# Patient Record
Sex: Female | Born: 1945
Health system: Southern US, Community
[De-identification: ages and names within clinical notes are randomized; demographics above are authoritative.]

## PROBLEM LIST (undated history)

## (undated) DIAGNOSIS — M6281 Muscle weakness (generalized): Secondary | ICD-10-CM

## (undated) DIAGNOSIS — R41841 Cognitive communication deficit: Secondary | ICD-10-CM

## (undated) DIAGNOSIS — M48061 Spinal stenosis, lumbar region without neurogenic claudication: Secondary | ICD-10-CM

## (undated) DIAGNOSIS — G9341 Metabolic encephalopathy: Secondary | ICD-10-CM

## (undated) DIAGNOSIS — G5603 Carpal tunnel syndrome, bilateral upper limbs: Secondary | ICD-10-CM

## (undated) DIAGNOSIS — N179 Acute kidney failure, unspecified: Secondary | ICD-10-CM

## (undated) DIAGNOSIS — R519 Headache, unspecified: Secondary | ICD-10-CM

## (undated) DIAGNOSIS — K219 Gastro-esophageal reflux disease without esophagitis: Secondary | ICD-10-CM

## (undated) DIAGNOSIS — B029 Zoster without complications: Secondary | ICD-10-CM

## (undated) DIAGNOSIS — I509 Heart failure, unspecified: Secondary | ICD-10-CM

## (undated) DIAGNOSIS — M7580 Other shoulder lesions, unspecified shoulder: Secondary | ICD-10-CM

## (undated) DIAGNOSIS — D649 Anemia, unspecified: Secondary | ICD-10-CM

## (undated) DIAGNOSIS — N189 Chronic kidney disease, unspecified: Secondary | ICD-10-CM

## (undated) DIAGNOSIS — F329 Major depressive disorder, single episode, unspecified: Secondary | ICD-10-CM

## (undated) DIAGNOSIS — M4800 Spinal stenosis, site unspecified: Secondary | ICD-10-CM

## (undated) DIAGNOSIS — M353 Polymyalgia rheumatica: Secondary | ICD-10-CM

## (undated) DIAGNOSIS — R51 Headache: Secondary | ICD-10-CM

## (undated) DIAGNOSIS — R1310 Dysphagia, unspecified: Secondary | ICD-10-CM

## (undated) DIAGNOSIS — M2550 Pain in unspecified joint: Secondary | ICD-10-CM

## (undated) DIAGNOSIS — I10 Essential (primary) hypertension: Secondary | ICD-10-CM

## (undated) DIAGNOSIS — G894 Chronic pain syndrome: Secondary | ICD-10-CM

## (undated) DIAGNOSIS — M1990 Unspecified osteoarthritis, unspecified site: Secondary | ICD-10-CM

## (undated) DIAGNOSIS — M359 Systemic involvement of connective tissue, unspecified: Secondary | ICD-10-CM

## (undated) HISTORY — DX: Polymyalgia rheumatica: M35.3

## (undated) HISTORY — PX: CHOLECYSTECTOMY: SHX55

## (undated) HISTORY — DX: Major depressive disorder, single episode, unspecified: F32.9

## (undated) HISTORY — DX: Dysphagia, unspecified: R13.10

## (undated) HISTORY — DX: Cognitive communication deficit: R41.841

## (undated) HISTORY — DX: Chronic pain syndrome: G89.4

## (undated) HISTORY — DX: Spinal stenosis, lumbar region without neurogenic claudication: M48.061

## (undated) HISTORY — DX: Zoster without complications: B02.9

## (undated) HISTORY — DX: Muscle weakness (generalized): M62.81

## (undated) HISTORY — DX: Chronic kidney disease, unspecified: N18.9

## (undated) HISTORY — DX: Pain in unspecified joint: M25.50

## (undated) HISTORY — PX: BACK SURGERY: SHX140

## (undated) HISTORY — DX: Acute kidney failure, unspecified: N17.9

## (undated) HISTORY — DX: Metabolic encephalopathy: G93.41

## (undated) SURGERY — COLONOSCOPY
Anesthesia: Moderate Sedation

---

## 2002-12-25 ENCOUNTER — Encounter: Payer: Self-pay | Admitting: Family Medicine

## 2002-12-25 ENCOUNTER — Ambulatory Visit (HOSPITAL_COMMUNITY): Admission: RE | Admit: 2002-12-25 | Discharge: 2002-12-25 | Payer: Self-pay | Admitting: Family Medicine

## 2005-10-27 ENCOUNTER — Emergency Department (HOSPITAL_COMMUNITY): Admission: EM | Admit: 2005-10-27 | Discharge: 2005-10-27 | Payer: Self-pay | Admitting: Emergency Medicine

## 2007-08-29 ENCOUNTER — Ambulatory Visit (HOSPITAL_COMMUNITY): Admission: RE | Admit: 2007-08-29 | Discharge: 2007-08-29 | Payer: Self-pay | Admitting: Family Medicine

## 2008-07-14 ENCOUNTER — Encounter: Admission: RE | Admit: 2008-07-14 | Discharge: 2008-07-14 | Payer: Self-pay | Admitting: Family Medicine

## 2008-10-25 ENCOUNTER — Inpatient Hospital Stay (HOSPITAL_COMMUNITY): Admission: EM | Admit: 2008-10-25 | Discharge: 2008-10-29 | Payer: Self-pay | Admitting: Emergency Medicine

## 2008-10-28 ENCOUNTER — Encounter (INDEPENDENT_AMBULATORY_CARE_PROVIDER_SITE_OTHER): Payer: Self-pay | Admitting: General Surgery

## 2009-04-07 ENCOUNTER — Ambulatory Visit: Payer: Self-pay | Admitting: Orthopedic Surgery

## 2009-04-07 DIAGNOSIS — M170 Bilateral primary osteoarthritis of knee: Secondary | ICD-10-CM | POA: Insufficient documentation

## 2009-04-07 DIAGNOSIS — M25569 Pain in unspecified knee: Secondary | ICD-10-CM | POA: Insufficient documentation

## 2009-04-12 ENCOUNTER — Encounter: Payer: Self-pay | Admitting: Orthopedic Surgery

## 2009-05-10 ENCOUNTER — Ambulatory Visit (HOSPITAL_COMMUNITY): Admission: RE | Admit: 2009-05-10 | Discharge: 2009-05-10 | Payer: Self-pay | Admitting: Family Medicine
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
## 2011-01-22 ENCOUNTER — Emergency Department (HOSPITAL_COMMUNITY)

## 2011-01-22 ENCOUNTER — Emergency Department (HOSPITAL_COMMUNITY)
Admission: EM | Admit: 2011-01-22 | Discharge: 2011-01-23 | Disposition: A | Discharge status: Home / Self Care | Attending: Emergency Medicine | Admitting: Emergency Medicine

## 2011-01-22 DIAGNOSIS — M51379 Other intervertebral disc degeneration, lumbosacral region without mention of lumbar back pain or lower extremity pain: Secondary | ICD-10-CM | POA: Insufficient documentation

## 2011-01-22 DIAGNOSIS — R109 Unspecified abdominal pain: Secondary | ICD-10-CM | POA: Insufficient documentation

## 2011-01-22 DIAGNOSIS — M545 Low back pain, unspecified: Secondary | ICD-10-CM | POA: Insufficient documentation

## 2011-01-22 DIAGNOSIS — M5137 Other intervertebral disc degeneration, lumbosacral region: Secondary | ICD-10-CM | POA: Insufficient documentation

## 2011-01-22 DIAGNOSIS — I10 Essential (primary) hypertension: Secondary | ICD-10-CM | POA: Insufficient documentation

## 2011-01-22 DIAGNOSIS — M25559 Pain in unspecified hip: Secondary | ICD-10-CM | POA: Insufficient documentation

## 2011-01-22 DIAGNOSIS — M129 Arthropathy, unspecified: Secondary | ICD-10-CM | POA: Insufficient documentation

## 2011-02-06 LAB — CARDIAC PANEL(CRET KIN+CKTOT+MB+TROPI)
CK, MB: 2.8 ng/mL (ref 0.3–4.0)
Relative Index: 1.4 (ref 0.0–2.5)
Total CK: 197 U/L — ABNORMAL HIGH (ref 7–177)
Troponin I: 0.02 ng/mL (ref 0.00–0.06)

## 2011-02-06 LAB — DIFFERENTIAL
Basophils Absolute: 0 10*3/uL (ref 0.0–0.1)
Basophils Absolute: 0 10*3/uL (ref 0.0–0.1)
Basophils Absolute: 0 10*3/uL (ref 0.0–0.1)
Basophils Absolute: 0 10*3/uL (ref 0.0–0.1)
Basophils Absolute: 0.1 10*3/uL (ref 0.0–0.1)
Basophils Relative: 0 % (ref 0–1)
Basophils Relative: 0 % (ref 0–1)
Basophils Relative: 0 % (ref 0–1)
Basophils Relative: 1 % (ref 0–1)
Basophils Relative: 1 % (ref 0–1)
Eosinophils Absolute: 0 10*3/uL (ref 0.0–0.7)
Eosinophils Absolute: 0 10*3/uL (ref 0.0–0.7)
Eosinophils Absolute: 0.1 10*3/uL (ref 0.0–0.7)
Eosinophils Absolute: 0.1 10*3/uL (ref 0.0–0.7)
Eosinophils Absolute: 0.2 10*3/uL (ref 0.0–0.7)
Eosinophils Relative: 0 % (ref 0–5)
Eosinophils Relative: 0 % (ref 0–5)
Eosinophils Relative: 1 % (ref 0–5)
Eosinophils Relative: 1 % (ref 0–5)
Eosinophils Relative: 2 % (ref 0–5)
Lymphocytes Relative: 10 % — ABNORMAL LOW (ref 12–46)
Lymphocytes Relative: 17 % (ref 12–46)
Lymphocytes Relative: 25 % (ref 12–46)
Lymphocytes Relative: 25 % (ref 12–46)
Lymphocytes Relative: 9 % — ABNORMAL LOW (ref 12–46)
Lymphs Abs: 1.1 10*3/uL (ref 0.7–4.0)
Lymphs Abs: 1.1 10*3/uL (ref 0.7–4.0)
Lymphs Abs: 1.7 10*3/uL (ref 0.7–4.0)
Lymphs Abs: 1.9 10*3/uL (ref 0.7–4.0)
Lymphs Abs: 2.2 10*3/uL (ref 0.7–4.0)
Monocytes Absolute: 0.3 10*3/uL (ref 0.1–1.0)
Monocytes Absolute: 0.5 10*3/uL (ref 0.1–1.0)
Monocytes Absolute: 0.6 10*3/uL (ref 0.1–1.0)
Monocytes Absolute: 0.6 10*3/uL (ref 0.1–1.0)
Monocytes Absolute: 0.7 10*3/uL (ref 0.1–1.0)
Monocytes Relative: 3 % (ref 3–12)
Monocytes Relative: 4 % (ref 3–12)
Monocytes Relative: 7 % (ref 3–12)
Monocytes Relative: 7 % (ref 3–12)
Monocytes Relative: 8 % (ref 3–12)
Neutro Abs: 10.7 10*3/uL — ABNORMAL HIGH (ref 1.7–7.7)
Neutro Abs: 5.1 10*3/uL (ref 1.7–7.7)
Neutro Abs: 5.7 10*3/uL (ref 1.7–7.7)
Neutro Abs: 7.8 10*3/uL — ABNORMAL HIGH (ref 1.7–7.7)
Neutro Abs: 9.4 10*3/uL — ABNORMAL HIGH (ref 1.7–7.7)
Neutrophils Relative %: 65 % (ref 43–77)
Neutrophils Relative %: 66 % (ref 43–77)
Neutrophils Relative %: 76 % (ref 43–77)
Neutrophils Relative %: 87 % — ABNORMAL HIGH (ref 43–77)
Neutrophils Relative %: 87 % — ABNORMAL HIGH (ref 43–77)

## 2011-02-06 LAB — CBC
HCT: 34.6 % — ABNORMAL LOW (ref 36.0–46.0)
HCT: 34.9 % — ABNORMAL LOW (ref 36.0–46.0)
HCT: 35.8 % — ABNORMAL LOW (ref 36.0–46.0)
HCT: 37.6 % (ref 36.0–46.0)
HCT: 39.8 % (ref 36.0–46.0)
Hemoglobin: 11.6 g/dL — ABNORMAL LOW (ref 12.0–15.0)
Hemoglobin: 11.6 g/dL — ABNORMAL LOW (ref 12.0–15.0)
Hemoglobin: 12.2 g/dL (ref 12.0–15.0)
Hemoglobin: 12.9 g/dL (ref 12.0–15.0)
Hemoglobin: 13.3 g/dL (ref 12.0–15.0)
MCHC: 33.3 g/dL (ref 30.0–36.0)
MCHC: 33.4 g/dL (ref 30.0–36.0)
MCHC: 33.5 g/dL (ref 30.0–36.0)
MCHC: 34.1 g/dL (ref 30.0–36.0)
MCHC: 34.2 g/dL (ref 30.0–36.0)
MCV: 86.5 fL (ref 78.0–100.0)
MCV: 86.7 fL (ref 78.0–100.0)
MCV: 86.8 fL (ref 78.0–100.0)
MCV: 87.2 fL (ref 78.0–100.0)
MCV: 87.3 fL (ref 78.0–100.0)
Platelets: 260 10*3/uL (ref 150–400)
Platelets: 261 10*3/uL (ref 150–400)
Platelets: 271 10*3/uL (ref 150–400)
Platelets: 309 10*3/uL (ref 150–400)
Platelets: 315 10*3/uL (ref 150–400)
RBC: 3.96 MIL/uL (ref 3.87–5.11)
RBC: 4.01 MIL/uL (ref 3.87–5.11)
RBC: 4.14 MIL/uL (ref 3.87–5.11)
RBC: 4.34 MIL/uL (ref 3.87–5.11)
RBC: 4.58 MIL/uL (ref 3.87–5.11)
RDW: 14.3 % (ref 11.5–15.5)
RDW: 14.5 % (ref 11.5–15.5)
RDW: 14.6 % (ref 11.5–15.5)
RDW: 14.7 % (ref 11.5–15.5)
RDW: 14.8 % (ref 11.5–15.5)
WBC: 10.4 10*3/uL (ref 4.0–10.5)
WBC: 10.9 10*3/uL — ABNORMAL HIGH (ref 4.0–10.5)
WBC: 12.4 10*3/uL — ABNORMAL HIGH (ref 4.0–10.5)
WBC: 7.8 10*3/uL (ref 4.0–10.5)
WBC: 8.6 10*3/uL (ref 4.0–10.5)

## 2011-02-06 LAB — URINALYSIS, ROUTINE W REFLEX MICROSCOPIC
Bilirubin Urine: NEGATIVE
Bilirubin Urine: NEGATIVE
Glucose, UA: NEGATIVE mg/dL
Glucose, UA: NEGATIVE mg/dL
Hgb urine dipstick: NEGATIVE
Hgb urine dipstick: NEGATIVE
Ketones, ur: NEGATIVE mg/dL
Ketones, ur: NEGATIVE mg/dL
Leukocytes, UA: NEGATIVE
Leukocytes, UA: NEGATIVE
Nitrite: NEGATIVE
Nitrite: NEGATIVE
Protein, ur: 100 mg/dL — AB
Protein, ur: 30 mg/dL — AB
Specific Gravity, Urine: >1.03 — ABNORMAL HIGH (ref 1.005–1.030)
Specific Gravity, Urine: >1.03 — ABNORMAL HIGH (ref 1.005–1.030)
Urobilinogen, UA: 0.2 mg/dL (ref 0.0–1.0)
Urobilinogen, UA: 0.2 mg/dL (ref 0.0–1.0)
pH: 5 (ref 5.0–8.0)
pH: 7 (ref 5.0–8.0)

## 2011-02-06 LAB — COMPREHENSIVE METABOLIC PANEL
ALT: 28 U/L (ref 0–35)
ALT: 33 U/L (ref 0–35)
ALT: 39 U/L — ABNORMAL HIGH (ref 0–35)
AST: 20 U/L (ref 0–37)
AST: 23 U/L (ref 0–37)
AST: 26 U/L (ref 0–37)
Albumin: 2.9 g/dL — ABNORMAL LOW (ref 3.5–5.2)
Albumin: 3.2 g/dL — ABNORMAL LOW (ref 3.5–5.2)
Albumin: 3.5 g/dL (ref 3.5–5.2)
Alkaline Phosphatase: 71 U/L (ref 39–117)
Alkaline Phosphatase: 76 U/L (ref 39–117)
Alkaline Phosphatase: 86 U/L (ref 39–117)
BUN: 10 mg/dL (ref 6–23)
BUN: 14 mg/dL (ref 6–23)
BUN: 9 mg/dL (ref 6–23)
CO2: 27 mEq/L (ref 19–32)
CO2: 28 mEq/L (ref 19–32)
CO2: 29 mEq/L (ref 19–32)
Calcium: 8.9 mg/dL (ref 8.4–10.5)
Calcium: 9.1 mg/dL (ref 8.4–10.5)
Calcium: 9.8 mg/dL (ref 8.4–10.5)
Chloride: 102 mEq/L (ref 96–112)
Chloride: 102 mEq/L (ref 96–112)
Chloride: 104 mEq/L (ref 96–112)
Creatinine, Ser: 0.85 mg/dL (ref 0.4–1.2)
Creatinine, Ser: 0.87 mg/dL (ref 0.4–1.2)
Creatinine, Ser: 0.94 mg/dL (ref 0.4–1.2)
GFR calc Af Amer: >60 mL/min (ref >60)
GFR calc Af Amer: >60 mL/min (ref >60)
GFR calc Af Amer: >60 mL/min (ref >60)
GFR calc non Af Amer: >60 mL/min (ref >60)
GFR calc non Af Amer: >60 mL/min (ref >60)
GFR calc non Af Amer: >60 mL/min (ref >60)
Glucose, Bld: 125 mg/dL — ABNORMAL HIGH (ref 70–99)
Glucose, Bld: 91 mg/dL (ref 70–99)
Glucose, Bld: 94 mg/dL (ref 70–99)
Potassium: 3.4 mEq/L — ABNORMAL LOW (ref 3.5–5.1)
Potassium: 3.6 mEq/L (ref 3.5–5.1)
Potassium: 4 mEq/L (ref 3.5–5.1)
Sodium: 136 mEq/L (ref 135–145)
Sodium: 136 mEq/L (ref 135–145)
Sodium: 140 mEq/L (ref 135–145)
Total Bilirubin: 0.5 mg/dL (ref 0.3–1.2)
Total Bilirubin: 0.5 mg/dL (ref 0.3–1.2)
Total Bilirubin: 0.7 mg/dL (ref 0.3–1.2)
Total Protein: 6.2 g/dL (ref 6.0–8.3)
Total Protein: 6.2 g/dL (ref 6.0–8.3)
Total Protein: 6.9 g/dL (ref 6.0–8.3)

## 2011-02-06 LAB — BASIC METABOLIC PANEL
BUN: 13 mg/dL (ref 6–23)
BUN: 7 mg/dL (ref 6–23)
CO2: 28 mEq/L (ref 19–32)
CO2: 29 mEq/L (ref 19–32)
Calcium: 8.6 mg/dL (ref 8.4–10.5)
Calcium: 9.2 mg/dL (ref 8.4–10.5)
Chloride: 102 mEq/L (ref 96–112)
Chloride: 105 mEq/L (ref 96–112)
Creatinine, Ser: 0.87 mg/dL (ref 0.4–1.2)
Creatinine, Ser: 0.93 mg/dL (ref 0.4–1.2)
GFR calc Af Amer: >60 mL/min (ref >60)
GFR calc Af Amer: >60 mL/min (ref >60)
GFR calc non Af Amer: >60 mL/min (ref >60)
GFR calc non Af Amer: >60 mL/min (ref >60)
Glucose, Bld: 119 mg/dL — ABNORMAL HIGH (ref 70–99)
Glucose, Bld: 134 mg/dL — ABNORMAL HIGH (ref 70–99)
Potassium: 3.3 mEq/L — ABNORMAL LOW (ref 3.5–5.1)
Potassium: 3.9 mEq/L (ref 3.5–5.1)
Sodium: 138 mEq/L (ref 135–145)
Sodium: 139 mEq/L (ref 135–145)

## 2011-02-06 LAB — PROTIME-INR
INR: 1 (ref 0.00–1.49)
Prothrombin Time: 13 seconds (ref 11.6–15.2)

## 2011-02-06 LAB — URINE MICROSCOPIC-ADD ON

## 2011-02-06 LAB — URINE CULTURE
Colony Count: 25000
Colony Count: NO GROWTH
Culture: NO GROWTH
Special Requests: NEGATIVE

## 2011-02-06 LAB — LIPASE, BLOOD: Lipase: 33 U/L (ref 11–59)

## 2011-02-06 LAB — LACTIC ACID, PLASMA: Lactic Acid, Venous: 1.6 mmol/L (ref 0.5–2.2)

## 2011-02-06 LAB — AMYLASE: Amylase: 78 U/L (ref 27–131)

## 2011-02-06 LAB — APTT: aPTT: 28 seconds (ref 24–37)

## 2011-03-07 ENCOUNTER — Ambulatory Visit (HOSPITAL_COMMUNITY)
Admission: RE | Admit: 2011-03-07 | Discharge: 2011-03-07 | Disposition: A | Discharge status: Home / Self Care | Source: Ambulatory Visit | Attending: Internal Medicine | Admitting: Internal Medicine

## 2011-03-07 DIAGNOSIS — R262 Difficulty in walking, not elsewhere classified: Secondary | ICD-10-CM | POA: Insufficient documentation

## 2011-03-07 DIAGNOSIS — M6281 Muscle weakness (generalized): Secondary | ICD-10-CM | POA: Insufficient documentation

## 2011-03-07 DIAGNOSIS — M545 Low back pain, unspecified: Secondary | ICD-10-CM | POA: Insufficient documentation

## 2011-03-07 DIAGNOSIS — IMO0001 Reserved for inherently not codable concepts without codable children: Secondary | ICD-10-CM | POA: Insufficient documentation

## 2011-03-07 DIAGNOSIS — M79609 Pain in unspecified limb: Secondary | ICD-10-CM | POA: Insufficient documentation

## 2011-03-07 NOTE — Group Therapy Note (Signed)
Nicole [name], Nicole [name]                ACCOUNT NO.:  [id number]   MEDICAL RECORD NO.:  [id number]          PATIENT TYPE:  INP   LOCATION:  IC01                          FACILITY:  [facility]   PHYSICIAN:  Nicole Haff, MD     DATE OF BIRTH:  [birth year]   DATE OF PROCEDURE:  10/27/2008  DATE OF DISCHARGE:                                 PROGRESS NOTE   SUBJECTIVE:  The patient still continues to have significant nausea.  Denies any vomiting.  She is complaining of pain in the right flank and  in the right upper abdomen.  The pain is about 6/10 in intensity.  She  cannot get comfortable on the bed.   OBJECTIVE:  VITAL SIGNS:  Temperature 98.6, blood pressure 103/65, heart  rate 79, respiratory rate 14, saturation 92% on 2 liters.  GENERAL EXAM:  Is an morbidly obese black female in some discomfort but  in no distress.  HEENT:  There is no pallor, no icterus.  Oral mucous membrane is moist.  No oral lesions are noted.  NECK:  Soft and supple.  No thyromegaly is appreciated.  LUNGS:  Reveal poor entry at the bases but clear to auscultation  otherwise.  CARDIOVASCULAR:  S1-S2 is normal regular.  No murmurs appreciated.  No  S3-S4, no rubs, no bruits.  ABDOMEN:  Is obese, soft.  Tenderness in the right upper quadrant with  equivocal Murphy's sign.  No rebound, rigidity or guarding.  Bowel  sounds present.  EXTREMITIES:  Show no edema.   LABORATORY DATA:  No labs available this morning.  Her LFTs have been  normal from 2 days ago.  Amylase, lipase was normal from yesterday.  Cardiac enzymes were negative yesterday evening.  UA was equivocal for  UTI.  Imaging studies done so far:  She has had a CT of the abdomen and  pelvis initially without contrast which did not show any evidence for  any nephrolithiasis.  It did show colonic diverticulosis without  evidence for diverticulitis.  Subsequently, a CT was done with contrast  to look for ischemia, and it was negative as well.  Gallbladder was  thought to be normal.   ASSESSMENT:  This is a 65 year old African American female who presents  with nausea and abdominal pain.  I think the etiology for her pain is  most likely biliary.   1. Abdominal pain.  We will check an ultrasound of the abdomen.  We      will keep her n.p.o.  I will have the general surgeon come and take      a look at her.  She is on Rocephin.  I am going to change that over      to Cipro and Flagyl.  2. History of hypertension.  She is on hydrochlorothiazide and Benicar      which will be continued.  3. She is on deep vein thrombosis prophylaxis, and she is on stress      ulcer prophylaxis.   So basically, await ultrasound of the abdomen and surgery consultation.      Nicole Haff,  MD  Electronically Signed     GK/MEDQ  D:  10/27/2008  T:  10/27/2008  Job:  [id number]   cc:   Nicole [name], M.D.  Fax: [phone]

## 2011-03-07 NOTE — Op Note (Signed)
Nicole [name], Nicole [name]                ACCOUNT NO.:  [id number]   MEDICAL RECORD NO.:  [id number]          PATIENT TYPE:  INP   LOCATION:  IC01                          FACILITY:  [facility]   PHYSICIAN:  Jamesetta So, M.D.  DATE OF BIRTH:  1946-05-15   DATE OF PROCEDURE:  10/28/2008  DATE OF DISCHARGE:                               OPERATIVE REPORT   PREOPERATIVE DIAGNOSES:  Cholecystitis and cholelithiasis.   POSTOPERATIVE DIAGNOSES:  Cholecystitis and cholelithiasis.   PROCEDURE:  Laparoscopic cholecystectomy.   SURGEON:  Jamesetta So, MD   ANESTHESIA:  General endotracheal.   INDICATIONS:  The patient is a 65 year old black female who presents  with biliary colic secondary to cholelithiasis.  The risks and benefits  of the procedure including bleeding, infection, hepatobiliary injury,  and the possibility of an open procedure were fully explained to the  patient, gave informed consent.   PROCEDURE NOTE:  The patient was placed in the supine position.  After  induction of general endotracheal anesthesia, the abdomen was prepped  and draped using the usual sterile technique with Betadine.  Surgical  site confirmation was performed.   A supraumbilical incision was made down to the fascia.  Veress needle  was introduced into the abdominal cavity and confirmation of placement  was done using the saline drop test.  The abdomen was then insufflated  to 16 mmHg pressure.  An 11-mm trocar was introduced into the abdominal  cavity under direct visualization without difficulty.  The patient was  placed in reversed Trendelenburg position.  An additional 11-mm trocar  was placed in the epigastric region and 5-mm trocar was placed in the  right upper quadrant and right flank regions.  Liver was inspected and  noted to be within normal limits.  The gallbladder was retracted  superiorly and laterally.  The dissection was begun around the  infundibulum of the gallbladder.  The cystic duct was  first identified.  The juncture to the infundibulum was fully identified.  EndoClips were  placed proximally and distally on the cystic duct, and the cystic duct  was divided.  This likewise done in the cystic artery.  The gallbladder  was then freed away from the gallbladder fossa using Bovie  electrocautery.  The gallbladder was delivered through the epigastric  trocar site using Endocatch bag.  The gallbladder fossa was inspected.  No abnormal bleeding or bile leakage was noted.  Surgicel was placed in  the gallbladder fossa.  All fluid and air were then evacuated from the  abdominal cavity prior to removal of the trocars.   All wounds irrigated with normal saline.  All wounds were injected with  0.5% Sensorcaine.  The supraumbilical fascia as well as the epigastric  fascia were reapproximated using 0 Vicryl in interrupted sutures.  All  skin incisions were closed using staples.  Betadine ointment and dry  sterile dressings were applied.   All tape and needle counts were correct at the end of the procedure.  The patient was extubated in the operating room and went back to  recovery room awake in stable condition.  COMPLICATIONS:  None.   SPECIMEN:  Gallbladder.   BLOOD LOSS:  Minimal.      Jamesetta So, M.D.  Electronically Signed     MAJ/MEDQ  D:  10/28/2008  T:  10/29/2008  Job:  [id number]   cc:   Leonides Grills, M.D.  Fax: [phone]

## 2011-03-07 NOTE — H&P (Signed)
NAME:  Nicole [name], Nicole [name]                ACCOUNT NO.:  [id number]   MEDICAL RECORD NO.:  [id number]          PATIENT TYPE:  EMS   LOCATION:  ED                            FACILITY:  [facility]   PHYSICIAN:  Mobolaji B. Maia Petties, M.D.DATE OF BIRTH:  1945/12/03   DATE OF ADMISSION:  10/25/2008  DATE OF DISCHARGE:  LH                              HISTORY & PHYSICAL   PRIMARY CARE PHYSICIAN:  Leonides Grills, M.D.   CHIEF COMPLAINT:  Abdominal pain for 2 days.   HISTORY OF PRESENTING COMPLAINT:  Nicole [name] is a 65 year old African  American female with history of hypertension, back pain and arthritis.  She is also morbidly obese.  The patient was in her usual state of  health until 2 days ago when she developed right flank pain.  This pain  has been constant since.  It radiates to the right iliac fossa,  suprapubic region.  Pain is now relocated in the lower abdomen.  There  is no associated relieving or aggravating factors, specifically pain is  not exacerbated by body movement or relieved by vomiting.  She started  vomiting today.  She has vomited several times, more than 6 times by her  daughter's account.  Initial vomitus was yellowish.  It is now become  greenish bilious fluid.  The patient moved her bowels this afternoon.  There is no hematochezia.  There has not been any hematemesis.  The  patient has been staying with her mother who underwent knee surgery  recently and she has not been having adequate sleep according to her  daughter's report.   She has not had any abdominal surgery.  The patient has undergone CT  scan of the abdomen without IV or p.o. contrast here in the emergency  room.  It was essentially unremarkable.  The pancreas and gallbladder  and appendix were deemed to be normal.  She does have sigmoid colon  diverticulosis without evidence of acute diverticulitis.  The patient  has marginally elevated white count of 10.9.  She has not had any fever.  The CT scan of the  abdomen, specifically looking for any ureteric or  kidney stones was negative.  The history was obtained from the patient's  daughter and sister-in-law.  The patient  is drowsy at this point from  the combination of Dilaudid and Ativan which she has received.   REVIEW OF SYSTEMS:  She denies fever, chills.  No dysuria or urgency or  increased frequency of micturition.  She has no hematuria.  No abdominal  distention.  There is no cough or shortness of breath.  She had chest  pain 2 days ago but this was  short lived and she has not had any  recurrence since then.  She denies any headaches or changes in her  vision.  The rest of review of systems is essentially negative.   PAST MEDICAL HISTORY:  1. Hypertension.  2. Back pain.  3. Arthritis.  4. Obesity.   PAST SURGICAL HISTORY:  Daughter reported remote history brain aneurysm  repair.  Specifically she has not had any intra-abdominal  surgery.   MEDICATIONS:  1. Mobic 15 mg daily.  2. Losartan 650 p.r.n.  3. Darvon/hydrochlorothiazide 160/12.5 daily.   ALLERGIES:  SULFA.   FAMILY HISTORY:  No family history of coronary artery disease or intra-  abdominal aneurysm.  Father passed away from pneumonia.  Mother is alive  and recently had knee surgery.   SOCIAL HISTORY:  The patient is not a smoker.  She does not drink  alcohol.  She lives with family.   PHYSICAL EXAMINATION:  VITAL SIGNS:  Temperature 97.9, blood pressure  144/98, pulse rate of 74, respiratory rate of 18, oxygen saturation 99%.  GENERAL:  On examination the patient is drowsy but arousable (she just  received Ativan and Dilaudid).  She is not in acute respiratory  distress.  HEENT:  Normocephalic, atraumatic head.  Pupils equal and symmetric.  No  elevated JVD.  Mucous membranes moist.  No oral thrush.  LUNGS:  Clear clinically to auscultation.  CVS:  S1 and S2 regular.  ABDOMEN:  Abdomen is not distended.  It is soft.  It is excruciatingly  tender in the  suprapubic and right iliac fossa and suprapubic region.  She also has specific point tenderness in the right paralimbal region.  There is no rebound or guarding.  Bowel sounds hypoactive.  No palpable  organomegaly.  RECTAL:  No blood on gloved finger.  Rectal examination was tender.  She  has a negative stool hemoccult.  EXTREMITIES:  Trace to 1+ pitting pedal edema.  No calf tenderness.  No  distal ischemia.  Dorsalis pedis pulses palpable bilaterally.  CNS:  No focal neurological deficits.   LABORATORY DATA:  Sodium 136, potassium 3.6, chloride 102, bicarb 27,  glucose 125, BUN 10, creatinine 0.85, calcium 9.8, total protein 6.9,  albumin 3.5, AST 26, ALT 29, alkaline phosphatase 86, total bilirubin  0.8, white cell 10.9, hemoglobin 13.3, hematocrit 9.8, platelets  315,000.  Absolute neutrophil count is elevated at 9.4. Amylase and  lipase unavailable.  Urinalysis shows specific gravity greater than  1.030, negative for nitrite and leukocyte esterase.  Microscopy shows  many epithelial cells, wbc's 11-20 per high-power field, bacteria many.  CT scan of the abdomen shows no evidence of upper urinary tract calculi  obstruction.  No acute abnormalities in the abdomen.  Descending colon  diverticulosis without evidence of acute diverticulitis.  CT scan of the  pelvis.  No distal ureteral calculi on either side.  No acute  abnormalities in the pelvis.  Severe diverticulosis without evidence of  acute diverticulitis.   ASSESSMENT AND PLAN:  Nicole [name] is a 65 year old African American female  presenting with right flank pain radiating to right iliac fossa and  suprapubic region associated with nausea and vomiting.  She has  significant tenderness in the suprapubic and right iliac area.  CT scan  of the abdomen and pelvis without IV or p.o. contrast is really  unremarkable.  The patient will be admitted for further evaluation and  treatment.   ADMISSION DIAGNOSIS:  1. Lower  abdominal/right flank pain associated with nausea and      vomiting.  Will keep the patient n.p.o.  We can start IV fluid      normal saline at 150 mL an hour with potassium supplement, Dilaudid      1-2 mg IV q. 4 hours p.r.n., Zofran 4 mg IV q.8 h p.r.n..  Will      check lactic acid level, amylase and lipase.  She will be on IV  Protonix 40 mg daily.  I am going to obtain a CT scan of the      abdomen and pelvis with p.o. and IV contrast to rule out ischemic      bowel or early appendicitis.  2. Probable urinary tract infection.  The urinalysis shows no nitrites      or leukocyte esterase in addition to many epithelial cells.  Will      repeat another specimen and send for culture.  Meanwhile the      patient will continue on IV Rocephin.  She has received 1 gram of      IV Rocephin in the emergency room.  Will repeat urine culture and      continue empiric treatment with Rocephin.  She was already received      1 gram of IV Rocephin in the emergency room.  3. Hypertension.  Will resume to Darvon and hydrochlorothiazide.  4. History of back pain.  Continue p.r.n. Dilaudid.      Mobolaji B. Maia Petties, M.D.  Electronically Signed     MBB/MEDQ  D:  10/25/2008  T:  10/26/2008  Job:  [id number]   cc:   Leonides Grills, M.D.  Fax: [phone]

## 2011-03-07 NOTE — Discharge Summary (Signed)
NAME:  Nicole [name], Nicole [name] NO.:  [id number]   MEDICAL RECORD NO.:  [id number]          PATIENT TYPE:  INP   LOCATION:  IC01                          FACILITY:  [facility]   PHYSICIAN:  Jamesetta So, M.D.  DATE OF BIRTH:  [birth year]   DATE OF ADMISSION:  10/25/2008  DATE OF DISCHARGE:  01/07/2010LH                               [place] COURSE SUMMARY:  The patient is a 65 year old white female who  presented to the emergency room with worsening abdominal pain.  A workup  did reveal cholecystitis secondary to cholelithiasis.  The Surgery  consultation was obtained and the patient ultimately underwent a  laparoscopic cholecystectomy on October 28, 2008.  She tolerated the  procedure well.  Postoperative course has been unremarkable.  Diet was  advanced without difficulty.   The patient is being discharged home on postoperative day #1 in good and  improving condition.   DISCHARGE INSTRUCTIONS:  The patient is to follow up with Dr. Aviva Signs on November 05, 2008.   DISCHARGE MEDICATIONS:  1. Mobic 15 mg p.o. daily.  2. Lorcet 10/650 one tablet p.o. q.6 h. p.r.n. pain.  3. Diovan/hydrochlorothiazide 160/12.5 mg p.o. daily.   PRINCIPAL DIAGNOSES:  1. Cholecystitis, cholelithiasis.  2. Hypertension.  3. Chronic back pain.   PRINCIPAL PROCEDURE:  Laparoscopic cholecystectomy on October 28, 2008.      Jamesetta So, M.D.  Electronically Signed     MAJ/MEDQ  D:  10/29/2008  T:  10/29/2008  Job:  [id number]   cc:   Leonides Grills, M.D.  Fax: [phone]

## 2011-03-14 ENCOUNTER — Ambulatory Visit (HOSPITAL_COMMUNITY)
Admission: RE | Admit: 2011-03-14 | Discharge: 2011-03-14 | Disposition: A | Discharge status: Home / Self Care | Source: Ambulatory Visit | Attending: *Deleted | Admitting: *Deleted

## 2011-03-16 ENCOUNTER — Ambulatory Visit (HOSPITAL_COMMUNITY)
Admission: RE | Admit: 2011-03-16 | Discharge: 2011-03-16 | Disposition: A | Discharge status: Home / Self Care | Source: Ambulatory Visit | Attending: *Deleted | Admitting: *Deleted

## 2011-03-22 ENCOUNTER — Ambulatory Visit (HOSPITAL_COMMUNITY)
Admission: RE | Admit: 2011-03-22 | Discharge: 2011-03-22 | Disposition: A | Discharge status: Home / Self Care | Source: Ambulatory Visit | Attending: *Deleted | Admitting: *Deleted

## 2011-03-23 ENCOUNTER — Ambulatory Visit (HOSPITAL_COMMUNITY)
Admission: RE | Admit: 2011-03-23 | Discharge: 2011-03-23 | Disposition: A | Discharge status: Home / Self Care | Source: Ambulatory Visit | Attending: *Deleted | Admitting: *Deleted

## 2011-03-28 ENCOUNTER — Ambulatory Visit (HOSPITAL_COMMUNITY)
Admission: RE | Admit: 2011-03-28 | Discharge: 2011-03-28 | Disposition: A | Discharge status: Home / Self Care | Source: Ambulatory Visit | Attending: Internal Medicine | Admitting: Internal Medicine

## 2011-03-28 DIAGNOSIS — M6281 Muscle weakness (generalized): Secondary | ICD-10-CM | POA: Insufficient documentation

## 2011-03-28 DIAGNOSIS — M545 Low back pain, unspecified: Secondary | ICD-10-CM | POA: Insufficient documentation

## 2011-03-28 DIAGNOSIS — R262 Difficulty in walking, not elsewhere classified: Secondary | ICD-10-CM | POA: Insufficient documentation

## 2011-03-28 DIAGNOSIS — M79609 Pain in unspecified limb: Secondary | ICD-10-CM | POA: Insufficient documentation

## 2011-03-28 DIAGNOSIS — IMO0001 Reserved for inherently not codable concepts without codable children: Secondary | ICD-10-CM | POA: Insufficient documentation

## 2011-03-30 ENCOUNTER — Ambulatory Visit (HOSPITAL_COMMUNITY)
Admission: RE | Admit: 2011-03-30 | Discharge: 2011-03-30 | Disposition: A | Discharge status: Home / Self Care | Source: Ambulatory Visit | Attending: *Deleted | Admitting: *Deleted

## 2011-04-05 ENCOUNTER — Inpatient Hospital Stay (HOSPITAL_COMMUNITY)
Admission: RE | Admit: 2011-04-05 | Discharge: 2011-04-05 | Disposition: A | Discharge status: Home / Self Care | Source: Ambulatory Visit | Attending: *Deleted | Admitting: *Deleted

## 2011-04-07 ENCOUNTER — Ambulatory Visit (HOSPITAL_COMMUNITY)
Admission: RE | Admit: 2011-04-07 | Discharge: 2011-04-07 | Disposition: A | Discharge status: Home / Self Care | Source: Ambulatory Visit

## 2011-04-11 ENCOUNTER — Ambulatory Visit (HOSPITAL_COMMUNITY)
Admission: RE | Admit: 2011-04-11 | Discharge: 2011-04-11 | Disposition: A | Discharge status: Home / Self Care | Source: Ambulatory Visit | Attending: *Deleted | Admitting: *Deleted

## 2011-04-13 ENCOUNTER — Ambulatory Visit (HOSPITAL_COMMUNITY)
Admission: RE | Admit: 2011-04-13 | Discharge: 2011-04-13 | Disposition: A | Discharge status: Home / Self Care | Source: Ambulatory Visit | Attending: *Deleted | Admitting: *Deleted

## 2011-04-18 ENCOUNTER — Ambulatory Visit (HOSPITAL_COMMUNITY)
Admission: RE | Admit: 2011-04-18 | Discharge: 2011-04-18 | Disposition: A | Discharge status: Home / Self Care | Source: Ambulatory Visit | Attending: *Deleted | Admitting: *Deleted

## 2011-04-20 ENCOUNTER — Ambulatory Visit (HOSPITAL_COMMUNITY)
Admission: RE | Admit: 2011-04-20 | Discharge: 2011-04-20 | Disposition: A | Discharge status: Home / Self Care | Source: Ambulatory Visit | Attending: *Deleted | Admitting: *Deleted

## 2011-04-20 ENCOUNTER — Other Ambulatory Visit (INDEPENDENT_AMBULATORY_CARE_PROVIDER_SITE_OTHER): Payer: Self-pay | Admitting: Internal Medicine

## 2011-04-20 ENCOUNTER — Ambulatory Visit (INDEPENDENT_AMBULATORY_CARE_PROVIDER_SITE_OTHER): Admitting: Internal Medicine

## 2011-04-20 DIAGNOSIS — R1310 Dysphagia, unspecified: Secondary | ICD-10-CM

## 2011-04-25 ENCOUNTER — Ambulatory Visit (HOSPITAL_COMMUNITY)
Admission: RE | Admit: 2011-04-25 | Discharge: 2011-04-25 | Disposition: A | Discharge status: Home / Self Care | Source: Ambulatory Visit | Attending: Internal Medicine | Admitting: Internal Medicine

## 2011-04-25 DIAGNOSIS — R262 Difficulty in walking, not elsewhere classified: Secondary | ICD-10-CM | POA: Insufficient documentation

## 2011-04-25 DIAGNOSIS — M6281 Muscle weakness (generalized): Secondary | ICD-10-CM | POA: Insufficient documentation

## 2011-04-25 DIAGNOSIS — IMO0001 Reserved for inherently not codable concepts without codable children: Secondary | ICD-10-CM | POA: Insufficient documentation

## 2011-04-25 DIAGNOSIS — M79609 Pain in unspecified limb: Secondary | ICD-10-CM | POA: Insufficient documentation

## 2011-04-25 DIAGNOSIS — M545 Low back pain, unspecified: Secondary | ICD-10-CM | POA: Insufficient documentation

## 2011-04-27 ENCOUNTER — Ambulatory Visit (HOSPITAL_COMMUNITY): Admitting: Physical Therapy

## 2011-05-02 ENCOUNTER — Ambulatory Visit (HOSPITAL_COMMUNITY)
Admission: RE | Admit: 2011-05-02 | Discharge: 2011-05-02 | Disposition: A | Discharge status: Home / Self Care | Source: Ambulatory Visit | Attending: *Deleted | Admitting: *Deleted

## 2011-05-02 NOTE — Progress Notes (Signed)
Physical Therapy Treatment Patient Name: Nicole [name] [id number] Date: 05/02/2011 TIME: 8:58-9:40 Visit: 15 Initial evaluation: 03/07/11 Re-evaluation: 04/05/11   SUBJECTIVE:  I had a lot of pain after the last treatment so I could not do my exercises for a few days.  But now the doctor put me on a prednisone pack and a muscle relaxer so I am back to doing my exercises just once a day, I will build up to do them more when I start to feel better.  The doctor also injected my L shoulder, so I am having some pain in my shoulder and through my upper chest.   I am no longer waking up at night because of pain, it is just my sleep cycle " OBJECTIVE: Pt ambulates in with quad cane in RUE.    Exercise/Treatments Bike 6 min 3.0 Glute Sets 15x Ab Set w/  Bent Knee Raise: 15x BLE  Clams: 15x BLE Bridging: 15x Isometric hip flexion: 10x5 sec hold LAQ's:  4# 15x BLE 5 Sit to stands BLE Heel Raises 20x Functional Squats 20x Hip Extension 20x BLE Gait training: 1 RT in gym with SPC, 1 RT without cane - with increased dyspnea ASSESSMENT: Pt had improved posture and cadence with SPC.  Pt able to tolerate all therex today without an increase in pain in her low back or in her shoulder. PLAN: Cont to progress.   End of Session Patient Active Problem List  Diagnoses  . KNEE, ARTHRITIS, DEGEN./OSTEO  . KNEE PAIN       Azan Maneri 05/02/2011, 9:13 AM

## 2011-05-04 ENCOUNTER — Telehealth (HOSPITAL_COMMUNITY): Payer: Self-pay

## 2011-05-04 ENCOUNTER — Inpatient Hospital Stay (HOSPITAL_COMMUNITY): Admission: RE | Admit: 2011-05-04 | Source: Ambulatory Visit

## 2011-05-04 DIAGNOSIS — IMO0002 Reserved for concepts with insufficient information to code with codable children: Secondary | ICD-10-CM | POA: Insufficient documentation

## 2011-05-04 DIAGNOSIS — M6281 Muscle weakness (generalized): Secondary | ICD-10-CM | POA: Insufficient documentation

## 2011-05-04 DIAGNOSIS — R269 Unspecified abnormalities of gait and mobility: Secondary | ICD-10-CM | POA: Insufficient documentation

## 2011-05-09 ENCOUNTER — Ambulatory Visit (HOSPITAL_COMMUNITY)
Admission: RE | Admit: 2011-05-09 | Discharge: 2011-05-09 | Disposition: A | Discharge status: Home / Self Care | Source: Ambulatory Visit | Attending: Internal Medicine | Admitting: Internal Medicine

## 2011-05-09 ENCOUNTER — Ambulatory Visit (HOSPITAL_COMMUNITY)
Admission: RE | Admit: 2011-05-09 | Discharge: 2011-05-09 | Disposition: A | Discharge status: Home / Self Care | Source: Ambulatory Visit | Attending: Physical Medicine and Rehabilitation | Admitting: Physical Medicine and Rehabilitation

## 2011-05-09 DIAGNOSIS — M6281 Muscle weakness (generalized): Secondary | ICD-10-CM

## 2011-05-09 DIAGNOSIS — K449 Diaphragmatic hernia without obstruction or gangrene: Secondary | ICD-10-CM | POA: Insufficient documentation

## 2011-05-09 DIAGNOSIS — R262 Difficulty in walking, not elsewhere classified: Secondary | ICD-10-CM

## 2011-05-09 DIAGNOSIS — R1310 Dysphagia, unspecified: Secondary | ICD-10-CM | POA: Insufficient documentation

## 2011-05-09 DIAGNOSIS — IMO0002 Reserved for concepts with insufficient information to code with codable children: Secondary | ICD-10-CM

## 2011-05-09 NOTE — Patient Instructions (Addendum)
Pt to progress her walking and activity slowly at home.

## 2011-05-09 NOTE — Progress Notes (Signed)
Physical Therapy Treatment Patient Name: Nicole [name] [id number] Date: 05/09/2011  Time in:  8:08 Time out: 8:50 Visits #: 66; 1 of 8 for next assessment Charges: Therex; 35  Initial evaluation date:  03/07/11 Next Re-evaluation due: 06/06/11  SUBJECTIVE:  Symptoms/Limitations Symptoms: Pt. reports her back is 100% better but not 100% well; States her LB gets "tired" and has dull cramping into L hip and posterior LE that stays there. Pain Assessment Currently in Pain?: No/denies Multiple Pain Sites: Yes; Pt. Complains of L shoulder pain located near her collar bone; Noted swelling in that area.  Pt. States she has been getting shots to help with the pain.  Precautions/Restrictions :  None noted    OBJECTIVE:  Aerobic/warm-up:  Bike 6 minutes at 3.0; seat at 8 Standing:   BLE heelraises 20X   Functional squats 20X   Hip Ext B 20X        Goals PT Short Term Goals Short Term Goal 2 Progress: Partly met (lumbar flex/ext are WNL, SB and rot still limited 30-50%) PT Long Term Goals Long Term Goal 1 Progress: Met Long Term Goal 2 Progress: Not met Long Term Goal 3 Progress: Progressing toward goal Long Term Goal 4 Progress: Met  * Pt wishes to be able to complete her grocery shopping Independently and help care for her mother and brother.  Pt continues to ambulate with SPC and tires easily.  She is still unable to go up and down steps reciprocally.  End of Session  Patient Active Problem List  Diagnoses  . KNEE, ARTHRITIS, DEGEN./OSTEO  . KNEE PAIN  . Thoracic or lumbosacral neuritis or radiculitis, unspecified  . Difficulty in walking  . Muscle weakness (generalized)   PT - End of Session Activity Tolerance: Patient tolerated treatment well General Behavior During Session: [facility] for tasks performed Cognition: [facility] for tasks performed PT Assessment and Plan Clinical Impression Statement: Pt overall improved, however strength has not improved to allow increased function and  community ambulation (i.e. grocery shopping not using motorized buggy).  Pt. continues to use [facility] and wishes to return to ambulation without it.  Strength  (tested by Rayetta Humphrey)  R hip 4+/5 (4+/5), L hip 3+/5 (3+/5), L Ant tib 3/5 (3-/5) , L quad 4/5(4/5), L hamstring 4+/5(4+/5).  All others and R LE 5/5.  Rehab Potential: Good PT Frequency: Min 2X/week PT Duration: 4 weeks PT Treatment/Interventions: Balance training;Functional mobility training;Gait training PT Plan: Pt request to continue to work on functional goals of completing community ADL's 2X week for 4 more weeks.   Mare Ferrari, Elysse Polidore B 05/09/2011, 9:17 AM

## 2011-05-11 ENCOUNTER — Ambulatory Visit (HOSPITAL_COMMUNITY)

## 2011-05-11 ENCOUNTER — Ambulatory Visit (HOSPITAL_COMMUNITY): Admitting: Physical Therapy

## 2011-05-16 ENCOUNTER — Ambulatory Visit (HOSPITAL_COMMUNITY)
Admission: RE | Admit: 2011-05-16 | Discharge: 2011-05-16 | Disposition: A | Discharge status: Home / Self Care | Source: Ambulatory Visit | Attending: *Deleted | Admitting: *Deleted

## 2011-05-16 NOTE — Patient Instructions (Signed)
Instructed in LAQ with 3#; Side-lying abduction and prone heel squeeze.

## 2011-05-16 NOTE — Progress Notes (Signed)
Physical Therapy Treatment Patient Name: Nicole [name] [id number] Date: 05/16/2011  HPI:  Spondylosis    LBP  Visit # 17   2/8 for next reassessment due on 06/06/11.                                                                                                                                                                                  Symptoms/Limitations Symptoms: increased with walking  Precautions/Restrictions:  Falls, pt tends to furniture walk.  Pt was instructed to go back to using her quad cane and not her straight cane.     Mobility (including Balance):  Balance was not assessed but pt states that she feels off-balance when she is walking.       Exercise/Treatments Cervical Exercises Shoulder Extension: Strengthening;10 reps;Standing Row: Strengthening;Theraband;10 reps Theraband Level (Row): Level 3 (Green) Scapular Retraction: 10 reps;Standing;Theraband Theraband Level (Scapular Retraction): Level 3 (Green) Lumbar Exercises Scapular Retraction: 10 reps;Standing;Theraband Theraband Level (Scapular Retraction): Level 3 (Green) Row: Strengthening;Theraband;10 reps Theraband Level (Row): Level 3 (Green) Shoulder Extension: Strengthening;10 reps;Standing Stability Exercises Clam: Supine;15 reps Bridge: 15 reps Bent Knee Raise: 15 reps Ab Set: 10 reps (pelvic tilt) Isometric Hip Flexion: 10 reps;Supine Straight Leg Raise:  (sitting LAQ B 3#) Hip Abduction: Side-lying;10 reps Heel Squeeze: Prone;10 reps Functional Squats: 20 reps Heel Raises: 20 reps Lumbar Machine Exercises Stationary Bike: 6 min at 3.0 Additional Hip Exercises Stationary Bike: 6 min at 3.0 Knee Exercises Heel Raises: 20 reps Ankle Exercises Heel Raises: 20 reps Balance Exercises Stationary Bike: 6 min at 3.0 Heel Raises: 20 reps    Goals PT Short Term Goals Short Term Goal 1 Progress: Met Short Term Goal 2 Progress: Progressing toward goal PT Long Term Goals Long Term Goal 1 Progress:  Met Long Term Goal 2 Progress: Progressing toward goal Long Term Goal 3 Progress: Progressing toward goal Long Term Goal 4 Progress: Met End of Session Patient Active Problem List  Diagnoses  . KNEE, ARTHRITIS, DEGEN./OSTEO  . KNEE PAIN  . Thoracic or lumbosacral neuritis or radiculitis, unspecified  . Difficulty in walking  . Muscle weakness (generalized)   PT - End of Session Activity Tolerance: Patient tolerated treatment well General Behavior During Session: [facility] for tasks performed Cognition: [facility] for tasks performed PT Assessment and Plan Clinical Impression Statement: Patient continues to improve functionally;  Will continue to work on strengthening Rehab Potential: Good PT Frequency: Min 2X/week PT Duration: 4 weeks PT Treatment/Interventions: Therapeutic exercise PT Plan: Pt to work on prone exercises to strengthen back mm  RUSSELL,CINDY 05/16/2011, 12:11 PM

## 2011-05-18 ENCOUNTER — Ambulatory Visit (HOSPITAL_COMMUNITY): Admitting: *Deleted

## 2011-05-18 ENCOUNTER — Telehealth (HOSPITAL_COMMUNITY): Payer: Self-pay | Admitting: *Deleted

## 2011-05-19 ENCOUNTER — Ambulatory Visit (HOSPITAL_COMMUNITY)
Admission: RE | Admit: 2011-05-19 | Discharge: 2011-05-19 | Disposition: A | Discharge status: Home / Self Care | Source: Ambulatory Visit | Attending: Physical Medicine and Rehabilitation | Admitting: Physical Medicine and Rehabilitation

## 2011-05-19 DIAGNOSIS — R262 Difficulty in walking, not elsewhere classified: Secondary | ICD-10-CM

## 2011-05-19 DIAGNOSIS — M6281 Muscle weakness (generalized): Secondary | ICD-10-CM

## 2011-05-19 DIAGNOSIS — IMO0002 Reserved for concepts with insufficient information to code with codable children: Secondary | ICD-10-CM

## 2011-05-19 NOTE — Progress Notes (Signed)
Physical Therapy Treatment Patient Name: Nicole [name] [id number] Date: 05/19/2011  HPI: Symptoms/Limitations Symptoms: "I am a little stiff and very sore today." Pt ambulates in with quad cane in RUE and decreased cadance. How long can you sit comfortably?: 30 minutes  How long can you stand comfortably?: a few minutes Pain Assessment Currently in Pain?: Yes Pain Score:   2 Pain Location: Back Effect of Pain on Daily Activities: It is limiting her daily activities, but reports she is doing better since before she was first unable to do anything.    Precautions/Restrictions     Mobility (including Balance)       Exercise/Treatments Shoulder Extension: Power Tower;15 reps Row: Strengthening;15 reps Theraband Level (Row): Level 3 (Green) Scapular Retraction: Power Tower;15 reps Theraband Level (Scapular Retraction): Level 3 (Green) Scapular Retraction: Power Tower;15 reps Theraband Level (Scapular Retraction): Level 3 (Green) Row: Industrial/product designer Level (Row): Level 3 (Green) Shoulder Extension: Power Tower;15 reps Clam: Supine;15 reps (w/PPT) Bridge: 15 reps (w/PPT) Bent Knee Raise: 15 reps (w/PPT) Ab Set: 10 reps (PPT) Isometric Hip Flexion: 10 reps;5 seconds Functional Squat: 20 reps Stationary Bike: 6 min at 3.0 Heel Raises: 20 reps  SUPINE: Isometric single leg bridge w/PPT 10x5 sec hold   Goals PT Short Term Goals Short Term Goal 1: Independent with HEP Short Term Goal 1 Progress: Met Short Term Goal 2: ROM to be functional  Short Term Goal 2 Progress: Progressing toward goal PT Long Term Goals Long Term Goal 1: State the pain has decreased by 3 levels  Long Term Goal 1 Progress: Met Long Term Goal 2: Strength to be improved by 1 grade Long Term Goal 2 Progress: Progressing toward goal Long Term Goal 3: State that she is able to sit for 30 minutes without increased pain, walk for 30 minutes without increased pain.  Long Term Goal 3 Progress:  Partly met (sit 30 minutes) Long Term Goal 4: Be able to sleep. Long Term Goal 4 Progress: Met End of Session Patient Active Problem List  Diagnoses  . KNEE, ARTHRITIS, DEGEN./OSTEO  . KNEE PAIN  . Thoracic or lumbosacral neuritis or radiculitis, unspecified  . Difficulty in walking  . Muscle weakness (generalized)      Khyrie Masi 05/19/2011, 2:52 PM

## 2011-05-23 ENCOUNTER — Ambulatory Visit (HOSPITAL_COMMUNITY)
Admission: RE | Admit: 2011-05-23 | Discharge: 2011-05-23 | Disposition: A | Discharge status: Home / Self Care | Source: Ambulatory Visit | Attending: Family Medicine | Admitting: Family Medicine

## 2011-05-23 NOTE — Progress Notes (Signed)
Physical Therapy Treatment Patient Name: Nicole [name] [id number] Date: 05/23/2011  Time in: 8:15 Time out: 8:53 Visit # 71;  2/8 for next reassessment due on 06/06/11. Charges: Therex x 32'    HPI: Symptoms/Limitations Symptoms: I'm just sore in back; My left shoulder is bothering me from the bands. Pain Assessment Currently in Pain?: Yes Pain Score:   3 Pain Location: Shoulder Pain Orientation: Right   Exercise/Treatments  Warm up: Stationary Bike: 6 min at 3.0  Standing: Heel Raises: 20 reps Functional Squat: 20 reps  Seated: Knee Extension: 15 reps;Both;Strengthening;Seated (LAQS w/3#)  Supine: Clam: Supine;15 reps Bridge: 15 reps Bent Knee Raise: 15 reps Ab Set: 10 reps Isometric Hip Flexion: 10 reps;5 seconds Large Ball Abdominal Isometric: 5 reps;5 seconds  Side lying: Hip Abduction: Side-lying;10 reps  Prone: Heel Squeeze: Prone;10 reps   Goals PT Short Term Goals Short Term Goal 1: Independent with HEP Short Term Goal 1 Progress: Met Short Term Goal 2: ROM to be functional  Short Term Goal 2 Progress: Progressing toward goal PT Long Term Goals Long Term Goal 1: State the pain has decreased by 3 levels  Long Term Goal 1 Progress: Met Long Term Goal 2: Strength to be improved by 1 grade Long Term Goal 2 Progress: Progressing toward goal Long Term Goal 3: State that she is able to sit for 30 minutes without increased pain, walk for 30 minutes without increased pain.  Long Term Goal 3 Progress: Partly met Long Term Goal 4: Be able to sleep. Long Term Goal 4 Progress: Met End of Session Patient Active Problem List  Diagnoses  . KNEE, ARTHRITIS, DEGEN./OSTEO  . KNEE PAIN  . Thoracic or lumbosacral neuritis or radiculitis, unspecified  . Difficulty in walking  . Muscle weakness (generalized)   PT - End of Session Activity Tolerance: Patient tolerated treatment well General Behavior During Session: [facility] for tasks performed Cognition: [facility] for  tasks performed PT Assessment and Plan Clinical Impression Statement: Pt with increased activity tolerance this tx. Pt displays increased ease with mat mobility. Pt also with decreased antalgia secondary to decreased pain. PT Treatment/Interventions: Therapeutic exercise PT Plan: Continue to progress per PT POC.  Rachelle Hora Leah 05/23/2011, 9:00 AM

## 2011-05-25 ENCOUNTER — Ambulatory Visit (HOSPITAL_COMMUNITY)
Admission: RE | Admit: 2011-05-25 | Discharge: 2011-05-25 | Disposition: A | Discharge status: Home / Self Care | Payer: Medicare Other | Source: Ambulatory Visit | Attending: Internal Medicine | Admitting: Internal Medicine

## 2011-05-25 DIAGNOSIS — M545 Low back pain, unspecified: Secondary | ICD-10-CM | POA: Insufficient documentation

## 2011-05-25 DIAGNOSIS — R262 Difficulty in walking, not elsewhere classified: Secondary | ICD-10-CM | POA: Insufficient documentation

## 2011-05-25 DIAGNOSIS — M79609 Pain in unspecified limb: Secondary | ICD-10-CM | POA: Insufficient documentation

## 2011-05-25 DIAGNOSIS — IMO0001 Reserved for inherently not codable concepts without codable children: Secondary | ICD-10-CM | POA: Insufficient documentation

## 2011-05-25 DIAGNOSIS — M6281 Muscle weakness (generalized): Secondary | ICD-10-CM | POA: Insufficient documentation

## 2011-05-25 NOTE — Progress Notes (Signed)
Physical Therapy Treatment Patient Name: Nicole [name] [id number] Date: 05/25/2011  Time in: 9:37 Time out: 10:25  Visit # 18; 3/8 for next reassessment due on 06/06/11.  Charges: Therex x 32'    HPI: Symptoms/Limitations Symptoms: I'm sore in my knees and my left shoulder. Pain Assessment Currently in Pain?: Yes Pain Score:   3 Pain Location: Leg Pain Orientation: Right;Left   Exercise/Treatments Warm up:  Stationary Bike: 6 min at 3.0   Standing:  Heel Raises: 20 reps  Functional Squat: 20 reps   Seated:  Knee Extension: 15 reps;Both;Strengthening;Seated (LAQS w/3#)   Supine:  Clam: Supine;20reps  Bridge: 20 reps  Bent Knee Raise: 15 reps  Ab Set: 10 reps  Isometric Hip Flexion: 10 reps;5 seconds  Large Ball Abdominal Isometric: 5 reps;5 seconds   Side lying:  Hip Abduction: Side-lying;10 reps    Goals PT Short Term Goals Short Term Goal 1: Independent with HEP Short Term Goal 1 Progress: Met Short Term Goal 2: ROM to be functional  Short Term Goal 2 Progress: Progressing toward goal PT Long Term Goals Long Term Goal 1: State the pain has decreased by 3 levels  Long Term Goal 1 Progress: Met Long Term Goal 2: Strength to be improved by 1 grade Long Term Goal 2 Progress: Progressing toward goal Long Term Goal 3: State that she is able to sit for 30 minutes without increased pain, walk for 30 minutes without increased pain.  Long Term Goal 3 Progress: Partly met Long Term Goal 4: Be able to sleep. Long Term Goal 4 Progress: Met End of Session Patient Active Problem List  Diagnoses  . KNEE, ARTHRITIS, DEGEN./OSTEO  . KNEE PAIN  . Thoracic or lumbosacral neuritis or radiculitis, unspecified  . Difficulty in walking  . Muscle weakness (generalized)   PT - End of Session Activity Tolerance: Patient tolerated treatment well General Behavior During Session: [facility] for tasks performed Cognition: [facility] for tasks performed PT Assessment and Plan Clinical  Impression Statement: Pt completes therex with minimal difficulty. No new therex added secondary to soreness after prior tx. PT Treatment/Interventions: Therapeutic exercise PT Plan: Continue per PT POC.  Rachelle Hora [facility] 05/25/2011, 11:15 AM

## 2011-05-30 ENCOUNTER — Ambulatory Visit (HOSPITAL_COMMUNITY): Admitting: Physical Therapy

## 2011-06-01 ENCOUNTER — Ambulatory Visit (HOSPITAL_COMMUNITY)
Admission: RE | Admit: 2011-06-01 | Discharge: 2011-06-01 | Disposition: A | Discharge status: Home / Self Care | Payer: Medicare Other | Source: Ambulatory Visit | Attending: *Deleted | Admitting: *Deleted

## 2011-06-01 NOTE — Progress Notes (Signed)
Physical Therapy Evaluation  Patient Name: Nicole [name] [id number] Date: 06/01/2011 HPI:  Spinal stenosis with radicular sx.  Re-evaluation recommend 2x/wk for 4 more weeks. Symptoms/Limitations Symptoms: Pt states at times her back is very good, it is a 1/10 now.   How long can you sit comfortably?: Pt is able to sit for an hour now goal was 30 min met. How long can you stand comfortably?: Pt is able to stand for 15 minutes. How long can you walk comfortably?: Pt is able to walk for 10 minutes with her cane.   Tested in dept pt only able to walk 3:30 prior to fatigue.  Pt able to walk 10 minutes with three rest breaks. Pain Assessment Currently in Pain?: Yes Pain Location: Back Pain Orientation: Anterior Multiple Pain Sites: Yes Past Medical History: No past medical history on file. Past Surgical History: No past surgical history on file.  Precautions/Restrictions falls    Prior Functioning able to do all her housework; able to do her own grocery shopping.   Cognition  [facility]   Sensation/Coordination/Flexibility  Assessment RLE Strength Right Hip Flexion:  (4+/5 Last re-eval 6/13 4+/5) Right Hip Extension: 5/5 (was 4+) Right Hip ABduction: 3+/5 Right Hip ADduction: 4/5 Right Knee Flexion: 5/5 Right Knee Extension: 5/5 Right Ankle Dorsiflexion: 5/5 (initial eval 3+, re-eval 5/5 on 6/13) Right Ankle Plantar Flexion: 4/5 LLE Strength Left Hip Flexion: 3+/5 (initial eval was a 2/5; re-eval 6/12 3+) Left Hip Extension: 4/5 (was 3+) Left Hip ABduction: 4/5 (was a 3+) Left Hip ADduction: 3/5 Left Knee Flexion:  (4+ was a 4+) Left Knee Extension: 3+/5 (was a 3) Left Ankle Dorsiflexion: 3-/5 (was a 3-) Lumbar AROM Lumbar - Right Side Bend:  (decreased 25% was 50%) Lumbar - Left Side Bend:  (decreased 25% was 50%) Lumbar - Right Rotation: decreased 30% was 30% Lumbar - Left Rotation: decreased 30% was 30%  Mobility (including Balance) Ambulation/Gait Ambulation/Gait:  Yes Ambulation/Gait Assistance: 6: Modified independent (Device/Increase time) Ambulation Distance (Feet):  (amb for 10 min needing to take 3 breaks.) Assistive device: Small based quad cane  Balance Balance Assessed:  (Pt SLS less than 3 seconds B)  Exercise/Treatments Lumbar Stretches Standing Extension: 5 reps Stability Exercises Straight Leg Raise: 10 reps Hip Abduction: 10 reps;Side-lying (bilaterally completed SL adduction as well B) Leg Raise: 10 reps;Prone (bilaterally) Knee Exercises Knee Extension: Strengthening;15 reps;Both (3#)    Goals PT Short Term Goals Short Term Goal 1 Progress: Met Short Term Goal 2 Progress: Progressing toward goal Short Term Goal 3: Goal of being able to get into and out of bathtub has been met. Short Term Goal 4: New goal - Pt able to SLS 10 seconds on each leg to improve pt balance to prevent falls currently less than 3 seconds. PT Long Term Goals Long Term Goal 1 Progress: Met Long Term Goal 2 Progress: Progressing toward goal Long Term Goal 3: Able to sit for an hour; unable to walk for more than 3:30 without fatigue.  continue with the walking goal Long Term Goal 3 Progress: Progressing toward goal Long Term Goal 4 Progress: Met Long Term Goal 5: NEW increase hip strength 1/2 grade to allow pt to complete housework without multiple breaks. Long Term Goal 6:  Pt able to reach up without increased back pain. End of Session Patient Active Problem List  Diagnoses  . KNEE, ARTHRITIS, DEGEN./OSTEO  . KNEE PAIN  . Thoracic or lumbosacral neuritis or radiculitis, unspecified  . Difficulty in walking  .  Muscle weakness (generalized)   PT - End of Session Activity Tolerance: Patient tolerated treatment well General Behavior During Session: [facility] for tasks performed Cognition: [facility] for tasks performed PT Assessment and Plan Clinical Impression Statement: Pt continues to have decreased hip and back strength limiting functions of walking and  reaching into high cabinets.  Pt has progressed well but was significantly debiliitated  when she initally came to therapy.  Need to begin balance exerciseis. Rehab Potential: Good PT Frequency: Min 2X/week PT Duration: 4 weeks PT Treatment/Interventions: Gait training;Therapeutic exercise PT Plan: continue with hip strengthening/ gait training and begin balance exercises.Simulate reaching up. Time Calculation Start Time: [id number] Stop Time: [id number] Time Calculation (min): 58 min  RUSSELL,CINDY 06/01/2011, 12:04 PM

## 2011-06-01 NOTE — Patient Instructions (Addendum)
Pt is to begin walking for 10 minutes 2-3 times a day at home.  She may take rest breaks but the ultimate goal is to walk 10 minutes without having to rest.

## 2011-06-06 ENCOUNTER — Ambulatory Visit (HOSPITAL_COMMUNITY)
Admission: RE | Admit: 2011-06-06 | Discharge: 2011-06-06 | Disposition: A | Discharge status: Home / Self Care | Payer: Medicare Other | Source: Ambulatory Visit | Attending: Physical Therapy | Admitting: Physical Therapy

## 2011-06-06 NOTE — Progress Notes (Signed)
Physical Therapy Treatment Patient Name: Nicole [name] [id number] Date: 06/06/2011  Time in: 8:05am Time out: 8:49am Visit #20; 1/8 for recent re-eval done on 06/01/11. Next re-eval due:   07/02/11 Charges: Therex x 38', MHP 20' with supine therex   SUBJECTIVE: Symptoms/Limitations Symptoms: Pt. reports she is sore all over today.  Did more walking and activities over the weekend. Pain Assessment Currently in Pain?: Yes Pain Score:   3 Pain Location: Hip Pain Orientation: Left Pain Radiating Towards: into L thigh with cramping into anterior tib area and numbness into toes; pain across buttocks Pain Frequency: Intermittent  Precautions/Restrictions :  None noted        OBJECTIVE: Exercise/Treatments Warm up:  Stationary Bike: 6 min at 3.0 seat 8  Standing:  Heel Raises: 20X  Functional Squat: 20X  Lumbar extension 5X  Seated:  Knee Extension (LAQ's): 15X with 3#   Supine: (with MHP today due to soreness) SLR's 15X each Clam:  20X each  Bridge: 20X  Bent Knee Raise: 15 X each Ab Set: 15 X  Isometric Hip Flexion: 10X5" Large Ball Abdominal Isometric: 10X5"   Side lying:  Hip Abduction 10X each (not done today) Hip Adduction 10X each  Prone: Hip extension, bilaterally 10X (not done today)   Modalities Modalities: Moist Heat Moist Heat Therapy Number Minutes Moist Heat: 20 Minutes Moist Heat Location:  (low back)    End of Session Patient Active Problem List  Diagnoses  . KNEE, ARTHRITIS, DEGEN./OSTEO  . KNEE PAIN  . Thoracic or lumbosacral neuritis or radiculitis, unspecified  . Difficulty in walking  . Muscle weakness (generalized)   PT - End of Session Activity Tolerance: Patient tolerated treatment well General Behavior During Session: [facility] for tasks performed Cognition: [facility] for tasks performed PT Assessment and Plan Clinical Impression Statement: Added MHP with supine therex secondary to increased soreness.  Increased difficulty with transitional  movements due to soreness.  Able to complete all other exercises. PT Treatment/Interventions: Therapeutic exercise (MHP on low back with supine therex) PT Plan: Progress strength and function; progress to SL clams and resume SL and prone therex.  Mare Ferrari, Anagabriela Jokerst B 06/06/2011, 8:52 AM

## 2011-06-07 ENCOUNTER — Inpatient Hospital Stay (HOSPITAL_COMMUNITY): Admission: RE | Admit: 2011-06-07 | Source: Ambulatory Visit | Admitting: *Deleted

## 2011-06-13 ENCOUNTER — Ambulatory Visit (HOSPITAL_COMMUNITY)
Admission: RE | Admit: 2011-06-13 | Discharge: 2011-06-13 | Disposition: A | Discharge status: Home / Self Care | Payer: Medicare Other | Source: Ambulatory Visit | Attending: Physical Therapy | Admitting: Physical Therapy

## 2011-06-13 NOTE — Progress Notes (Signed)
Physical Therapy Treatment Patient Details  Name: Nicole [name] MRN: [id number] Date of Birth: 06-Jan-1946  Today's Date: 06/13/2011 Time: 0920-1009 Time Calculation (min): 49 min Visit#: 22 of 28 Re-eval: 06/30/11 Charges:  therex 20', neuro re-ed 20'  Subjective: Symptoms/Limitations Symptoms: Reports more soreness today, especially into UE's/shoulders.  Ambulating with quad cane today; states she had to go back to it because the [facility] wasn't stable enough. Pain Assessment Currently in Pain?: Yes Pain Score:   5 Pain Location: Other (Comment) ("All over soreness")   Mobility (including Balance):  Began new balance activities per below exercise/treatments      Exercise/Treatments Warm up:  Stationary Bike: 6 min at 3.0 seat 8  Standing:  SLS:  R:4"L:3" max of 5 Heel Raises: 20X  Toeraises 20X Functional Squat: 20X  Lumbar extension 10X  Side stepping without AD 1RT Retro gait without AD 1RT Hip Abduction 10X each (L done with R on 2" step) Hip Extension 10X each (L done with R on 2" step) Hip Flexion (do next visit) Theraband (green) Rows 10X    Retraction 10X    Extension 10X Seated:  Knee Extension (LAQ's): 15X with 3#  Sit to stands 5X Supine, SL and prone ex's cont as HEP     Physical Therapy Assessment and Plan PT Assessment and Plan Clinical Impression Statement: progressed therex to emphasize balance and activity tolerance.  Pt. with difficulty progressing L LE without dragging; required short seated rest break between each activity. PT Treatment/Interventions: Balance training;Neuromuscular re-education;Therapeutic exercise PT Plan: Progress activity tolerance and balance.      Problem List Patient Active Problem List  Diagnoses  . KNEE, ARTHRITIS, DEGEN./OSTEO  . KNEE PAIN  . Thoracic or lumbosacral neuritis or radiculitis, unspecified  . Difficulty in walking  . Muscle weakness (generalized)    PT - End of Session Equipment Utilized During  Treatment: Gait belt Activity Tolerance: Patient tolerated treatment well General Behavior During Session: [facility] for tasks performed Cognition: [facility] for tasks performed  Roseanne Reno B 06/13/2011, 10:17 AM

## 2011-06-15 ENCOUNTER — Ambulatory Visit (HOSPITAL_COMMUNITY)
Admission: RE | Admit: 2011-06-15 | Discharge: 2011-06-15 | Disposition: A | Discharge status: Home / Self Care | Payer: Medicare Other | Source: Ambulatory Visit | Attending: *Deleted | Admitting: *Deleted

## 2011-06-15 NOTE — Progress Notes (Addendum)
Physical Therapy Treatment Patient Details  Name: Nicole [name] MRN: [id number] Date of Birth: [birth year]  Today's Date: 06/15/2011 Time: [id number] Time Calculation (min): 54 min Visit#: 3 of 8 (#22) Re-eval: 06/30/11 Charges: Therex x 23' Gait x 8' MHP x 1 unit  Subjective: Symptoms/Limitations Symptoms: "I'm not really having any pain today. It's more soreness and stiffness." Pt ambulates into therapy SPC. Pain Assessment Currently in Pain?: No/denies   Mobility (including Balance) Ambulation/Gait Ambulation/Gait: Yes Ambulation/Gait Assistance: 7: Independent Ambulation Distance (Feet):  (gait 1x4'30" 1x3'45") Assistive device: Straight cane Gait velocity: Normal velocity at start of gait traing, decreased with fatigue.     Exercise/Treatments Warm up:  Stationary Bike: 6 min at 3.0 seat 8   Standing:  Heel Raises: 20X  Functional Squat: 20X  Lumbar extension 5X  Hip ext B x 10 Hip abd B x 10 Gait  Physical Therapy Assessment and Plan PT Assessment and Plan Clinical Impression Statement: Tx focus on standing therex to increase activity tolerance. Pt able to ambulate 4'30" with SPC before having to rest. MHP applied at end of session in chair secondary to pt was not comfortable with heat in supine. PT Treatment/Interventions: Therapeutic exercise PT Plan: Continue to progress pe PT POC.    Goals PT Short Term Goals PT Short Term Goal 1 - Progress: Met PT Short Term Goal 2 - Progress: Progressing toward goal PT Short Term Goal 4 - Progress: Progressing toward goal PT Long Term Goals PT Long Term Goal 1 - Progress: Met PT Long Term Goal 2 - Progress: Progressing toward goal Long Term Goal 3 Progress: Progressing toward goal Long Term Goal 4 Progress: Met  Problem List Patient Active Problem List  Diagnoses  . KNEE, ARTHRITIS, DEGEN./OSTEO  . KNEE PAIN  . Thoracic or lumbosacral neuritis or radiculitis, unspecified  . Difficulty in walking  . Muscle  weakness (generalized)    PT - End of Session Activity Tolerance: Patient tolerated treatment well General Behavior During Session: [facility] for tasks performed Cognition: [facility] for tasks performed  Jonah Blue 06/15/2011, 8:48 AM

## 2011-06-20 ENCOUNTER — Ambulatory Visit (HOSPITAL_COMMUNITY)
Admission: RE | Admit: 2011-06-20 | Discharge: 2011-06-20 | Disposition: A | Discharge status: Home / Self Care | Payer: Medicare Other | Source: Ambulatory Visit | Attending: *Deleted | Admitting: *Deleted

## 2011-06-20 NOTE — Progress Notes (Signed)
Physical Therapy Treatment Patient Details  Name: Nicole [name] MRN: [id number] Date of Birth: 18-Jun-1946  Today's Date: 06/20/2011 Time: [id number] Time Calculation (min): 42 min Visit#: 4 of 8 (23 total) Re-eval: 06/30/11 Charges:  therex : 18', MHP 20' supine  Subjective: Symptoms/Limitations Symptoms: Pt. reports more pain today than last visiti; thinks it may be the weather (damp/raining) Pain Assessment Currently in Pain?: Yes Pain Score:   5 Pain Location: Back Pain Orientation: Lower Pain Radiating Towards: L thigh   Exercise/Treatments Exercise/Treatments  Warm up:  Stationary Bike: 6 min at 3.0 seat 8 (held today) Standing:  Heel Raises: 20X  Functional Squat: 20X  Lumbar extension 10X  Hip ext B x 10 (held today) Hip abd B x 10  SLS (held today) Tband postural 3 (held today) Gait  Modalities Modalities: Moist Heat Moist Heat Therapy Number Minutes Moist Heat: 20 Minutes Moist Heat Location: Other (comment) (Low Back)  Physical Therapy Assessment and Plan PT Assessment and Plan Clinical Impression Statement: Pt. with increased pain today; began crying after completing L hip abduction.  Pt. placed on MHP to help reduce pain. PT Treatment/Interventions: Therapeutic exercise (MHP in supine) PT Plan: Progress per POC; attempt to add back hip extension, bike and postural tband exercises.      Problem List Patient Active Problem List  Diagnoses  . KNEE, ARTHRITIS, DEGEN./OSTEO  . KNEE PAIN  . Thoracic or lumbosacral neuritis or radiculitis, unspecified  . Difficulty in walking  . Muscle weakness (generalized)    PT - End of Session Activity Tolerance: Patient tolerated treatment well General Behavior During Session: [facility] for tasks performed Cognition: [facility] for tasks performed  Roseanne Reno B 06/20/2011, 8:36 AM

## 2011-06-22 ENCOUNTER — Telehealth (HOSPITAL_COMMUNITY): Payer: Self-pay

## 2011-06-22 ENCOUNTER — Inpatient Hospital Stay (HOSPITAL_COMMUNITY): Admission: RE | Admit: 2011-06-22 | Source: Ambulatory Visit | Admitting: *Deleted

## 2011-06-27 ENCOUNTER — Inpatient Hospital Stay (HOSPITAL_COMMUNITY): Admission: RE | Admit: 2011-06-27 | Source: Ambulatory Visit | Admitting: Physical Therapy

## 2011-06-29 ENCOUNTER — Ambulatory Visit (HOSPITAL_COMMUNITY): Admitting: Physical Therapy

## 2011-06-29 ENCOUNTER — Telehealth (HOSPITAL_COMMUNITY): Payer: Self-pay

## 2011-06-29 ENCOUNTER — Inpatient Hospital Stay (HOSPITAL_COMMUNITY): Admission: RE | Admit: 2011-06-29 | Source: Ambulatory Visit | Admitting: Physical Therapy

## 2011-07-11 ENCOUNTER — Telehealth (HOSPITAL_COMMUNITY): Payer: Self-pay

## 2011-07-12 ENCOUNTER — Ambulatory Visit (HOSPITAL_COMMUNITY): Admitting: Physical Therapy

## 2011-07-13 ENCOUNTER — Ambulatory Visit (HOSPITAL_COMMUNITY): Admitting: Physical Therapy

## 2011-07-17 ENCOUNTER — Telehealth (HOSPITAL_COMMUNITY): Payer: Self-pay

## 2011-07-17 ENCOUNTER — Ambulatory Visit (HOSPITAL_COMMUNITY): Admitting: Physical Therapy

## 2011-10-12 ENCOUNTER — Other Ambulatory Visit: Payer: Self-pay | Admitting: Physical Medicine and Rehabilitation

## 2011-10-12 DIAGNOSIS — M25512 Pain in left shoulder: Secondary | ICD-10-CM

## 2011-10-14 ENCOUNTER — Ambulatory Visit
Admission: RE | Admit: 2011-10-14 | Discharge: 2011-10-14 | Disposition: A | Discharge status: Home / Self Care | Payer: Medicare Other | Source: Ambulatory Visit | Attending: Physical Medicine and Rehabilitation | Admitting: Physical Medicine and Rehabilitation

## 2011-10-14 DIAGNOSIS — M25512 Pain in left shoulder: Secondary | ICD-10-CM

## 2011-11-27 DIAGNOSIS — M48061 Spinal stenosis, lumbar region without neurogenic claudication: Secondary | ICD-10-CM | POA: Diagnosis not present

## 2011-12-14 DIAGNOSIS — M19019 Primary osteoarthritis, unspecified shoulder: Secondary | ICD-10-CM | POA: Diagnosis not present

## 2012-01-22 DIAGNOSIS — M19019 Primary osteoarthritis, unspecified shoulder: Secondary | ICD-10-CM | POA: Diagnosis not present

## 2012-05-03 DIAGNOSIS — G894 Chronic pain syndrome: Secondary | ICD-10-CM | POA: Diagnosis not present

## 2012-05-03 DIAGNOSIS — M48061 Spinal stenosis, lumbar region without neurogenic claudication: Secondary | ICD-10-CM | POA: Diagnosis not present

## 2012-05-07 DIAGNOSIS — Z6841 Body Mass Index (BMI) 40.0 and over, adult: Secondary | ICD-10-CM | POA: Diagnosis not present

## 2012-05-07 DIAGNOSIS — R05 Cough: Secondary | ICD-10-CM | POA: Diagnosis not present

## 2012-05-07 DIAGNOSIS — R059 Cough, unspecified: Secondary | ICD-10-CM | POA: Diagnosis not present

## 2012-05-07 DIAGNOSIS — J069 Acute upper respiratory infection, unspecified: Secondary | ICD-10-CM | POA: Diagnosis not present

## 2012-05-24 ENCOUNTER — Emergency Department (HOSPITAL_COMMUNITY): Payer: Medicare Other

## 2012-05-24 ENCOUNTER — Encounter (HOSPITAL_COMMUNITY): Payer: Self-pay

## 2012-05-24 ENCOUNTER — Observation Stay (HOSPITAL_COMMUNITY)
Admission: EM | Admit: 2012-05-24 | Discharge: 2012-05-25 | Disposition: A | Discharge status: Home / Self Care | Payer: Medicare Other | Attending: General Surgery | Admitting: General Surgery

## 2012-05-24 DIAGNOSIS — I10 Essential (primary) hypertension: Secondary | ICD-10-CM | POA: Diagnosis not present

## 2012-05-24 DIAGNOSIS — K7689 Other specified diseases of liver: Secondary | ICD-10-CM | POA: Diagnosis not present

## 2012-05-24 DIAGNOSIS — R109 Unspecified abdominal pain: Secondary | ICD-10-CM

## 2012-05-24 DIAGNOSIS — K5730 Diverticulosis of large intestine without perforation or abscess without bleeding: Secondary | ICD-10-CM | POA: Diagnosis not present

## 2012-05-24 DIAGNOSIS — Z79899 Other long term (current) drug therapy: Secondary | ICD-10-CM | POA: Insufficient documentation

## 2012-05-24 DIAGNOSIS — R1031 Right lower quadrant pain: Principal | ICD-10-CM | POA: Insufficient documentation

## 2012-05-24 DIAGNOSIS — R112 Nausea with vomiting, unspecified: Secondary | ICD-10-CM | POA: Insufficient documentation

## 2012-05-24 HISTORY — DX: Gastro-esophageal reflux disease without esophagitis: K21.9

## 2012-05-24 HISTORY — DX: Spinal stenosis, site unspecified: M48.00

## 2012-05-24 HISTORY — DX: Essential (primary) hypertension: I10

## 2012-05-24 LAB — CBC WITH DIFFERENTIAL/PLATELET
Basophils Absolute: 0 10*3/uL (ref 0.0–0.1)
Basophils Absolute: 0 10*3/uL (ref 0.0–0.1)
Basophils Relative: 0 % (ref 0–1)
Basophils Relative: 0 % (ref 0–1)
Eosinophils Absolute: 0.2 10*3/uL (ref 0.0–0.7)
Eosinophils Absolute: 0.1 10*3/uL (ref 0.0–0.7)
Eosinophils Relative: 2 % (ref 0–5)
Eosinophils Relative: 1 % (ref 0–5)
HCT: 37.4 % (ref 36.0–46.0)
HCT: 34.8 % — ABNORMAL LOW (ref 36.0–46.0)
Hemoglobin: 12.6 g/dL (ref 12.0–15.0)
Hemoglobin: 11.6 g/dL — ABNORMAL LOW (ref 12.0–15.0)
Lymphocytes Relative: 24 % (ref 12–46)
Lymphocytes Relative: 20 % (ref 12–46)
Lymphs Abs: 2.4 10*3/uL (ref 0.7–4.0)
Lymphs Abs: 1.8 10*3/uL (ref 0.7–4.0)
MCH: 29.5 pg (ref 26.0–34.0)
MCH: 29.2 pg (ref 26.0–34.0)
MCHC: 33.7 g/dL (ref 30.0–36.0)
MCHC: 33.3 g/dL (ref 30.0–36.0)
MCV: 87.6 fL (ref 78.0–100.0)
MCV: 87.7 fL (ref 78.0–100.0)
Monocytes Absolute: 0.7 10*3/uL (ref 0.1–1.0)
Monocytes Absolute: 0.6 10*3/uL (ref 0.1–1.0)
Monocytes Relative: 7 % (ref 3–12)
Monocytes Relative: 7 % (ref 3–12)
Neutro Abs: 6.9 10*3/uL (ref 1.7–7.7)
Neutro Abs: 6.5 10*3/uL (ref 1.7–7.7)
Neutrophils Relative %: 67 % (ref 43–77)
Neutrophils Relative %: 72 % (ref 43–77)
Platelets: 323 10*3/uL (ref 150–400)
Platelets: 309 10*3/uL (ref 150–400)
RBC: 4.27 MIL/uL (ref 3.87–5.11)
RBC: 3.97 MIL/uL (ref 3.87–5.11)
RDW: 14.1 % (ref 11.5–15.5)
RDW: 14 % (ref 11.5–15.5)
WBC: 10.2 10*3/uL (ref 4.0–10.5)
WBC: 9 10*3/uL (ref 4.0–10.5)

## 2012-05-24 LAB — COMPREHENSIVE METABOLIC PANEL
ALT: 31 U/L (ref 0–35)
AST: 19 U/L (ref 0–37)
Albumin: 3.5 g/dL (ref 3.5–5.2)
Alkaline Phosphatase: 124 U/L — ABNORMAL HIGH (ref 39–117)
BUN: 19 mg/dL (ref 6–23)
CO2: 22 mEq/L (ref 19–32)
Calcium: 9.4 mg/dL (ref 8.4–10.5)
Chloride: 102 mEq/L (ref 96–112)
Creatinine, Ser: 0.84 mg/dL (ref 0.50–1.10)
GFR calc Af Amer: 83 mL/min — ABNORMAL LOW (ref >90)
GFR calc non Af Amer: 71 mL/min — ABNORMAL LOW (ref >90)
Glucose, Bld: 125 mg/dL — ABNORMAL HIGH (ref 70–99)
Potassium: 3.6 mEq/L (ref 3.5–5.1)
Sodium: 135 mEq/L (ref 135–145)
Total Bilirubin: 0.3 mg/dL (ref 0.3–1.2)
Total Protein: 7.3 g/dL (ref 6.0–8.3)

## 2012-05-24 LAB — URINALYSIS, ROUTINE W REFLEX MICROSCOPIC
Bilirubin Urine: NEGATIVE
Glucose, UA: NEGATIVE mg/dL
Hgb urine dipstick: NEGATIVE
Ketones, ur: NEGATIVE mg/dL
Leukocytes, UA: NEGATIVE
Nitrite: NEGATIVE
Protein, ur: NEGATIVE mg/dL
Specific Gravity, Urine: >1.03 — ABNORMAL HIGH (ref 1.005–1.030)
Urobilinogen, UA: 0.2 mg/dL (ref 0.0–1.0)
pH: 5.5 (ref 5.0–8.0)

## 2012-05-24 LAB — LIPASE, BLOOD: Lipase: 27 U/L (ref 11–59)

## 2012-05-24 MED ORDER — ONDANSETRON HCL 4 MG/2ML IJ SOLN
4.0000 mg | Freq: Three times a day (TID) | INTRAMUSCULAR | Status: AC | PRN
  Administered 2012-05-24 (×2): 4 mg via INTRAVENOUS
  Filled 2012-05-24 (×2): qty 2

## 2012-05-24 MED ORDER — HYDROMORPHONE HCL PF 1 MG/ML IJ SOLN
1.0000 mg | Freq: Once | INTRAMUSCULAR | Status: AC
  Administered 2012-05-24: 1 mg via INTRAVENOUS
  Filled 2012-05-24: qty 1

## 2012-05-24 MED ORDER — SODIUM CHLORIDE 0.9 % IJ SOLN
INTRAMUSCULAR | Status: AC
  Administered 2012-05-24: 17:00:00
  Filled 2012-05-24: qty 3

## 2012-05-24 MED ORDER — SODIUM CHLORIDE 0.9 % IV BOLUS (SEPSIS)
1000.0000 mL | Freq: Once | INTRAVENOUS | Status: AC
  Administered 2012-05-24: 1000 mL via INTRAVENOUS

## 2012-05-24 MED ORDER — SODIUM CHLORIDE 0.9 % IJ SOLN
INTRAMUSCULAR | Status: AC
  Administered 2012-05-24: 10 mL
  Filled 2012-05-24: qty 3

## 2012-05-24 MED ORDER — BIOTENE DRY MOUTH MT LIQD
15.0000 mL | Freq: Two times a day (BID) | OROMUCOSAL | Status: DC
  Administered 2012-05-24 – 2012-05-25 (×2): 15 mL via OROMUCOSAL

## 2012-05-24 MED ORDER — HYDROMORPHONE HCL PF 1 MG/ML IJ SOLN
INTRAMUSCULAR | Status: AC
  Filled 2012-05-24: qty 1

## 2012-05-24 MED ORDER — PROMETHAZINE HCL 25 MG/ML IJ SOLN
12.5000 mg | Freq: Once | INTRAMUSCULAR | Status: AC
  Administered 2012-05-24: 12.5 mg via INTRAVENOUS
  Filled 2012-05-24: qty 1

## 2012-05-24 MED ORDER — SODIUM CHLORIDE 0.9 % IV SOLN
INTRAVENOUS | Status: AC
  Administered 2012-05-24: 05:00:00 via INTRAVENOUS

## 2012-05-24 MED ORDER — FLEET ENEMA 7-19 GM/118ML RE ENEM
1.0000 | ENEMA | Freq: Once | RECTAL | Status: AC
  Administered 2012-05-24: 1 via RECTAL

## 2012-05-24 MED ORDER — HYDROMORPHONE HCL PF 1 MG/ML IJ SOLN
1.0000 mg | INTRAMUSCULAR | Status: DC | PRN
  Administered 2012-05-24 – 2012-05-25 (×2): 1 mg via INTRAVENOUS
  Filled 2012-05-24 (×3): qty 1

## 2012-05-24 MED ORDER — IOHEXOL 300 MG/ML  SOLN
120.0000 mL | Freq: Once | INTRAMUSCULAR | Status: AC | PRN
  Administered 2012-05-24: 120 mL via INTRAVENOUS

## 2012-05-24 MED ORDER — MAGNESIUM HYDROXIDE 400 MG/5ML PO SUSP
30.0000 mL | Freq: Every day | ORAL | Status: DC
  Administered 2012-05-24 – 2012-05-25 (×2): 30 mL via ORAL
  Filled 2012-05-24 (×2): qty 30

## 2012-05-24 MED ORDER — ONDANSETRON HCL 4 MG/2ML IJ SOLN
4.0000 mg | Freq: Once | INTRAMUSCULAR | Status: AC
  Administered 2012-05-24: 4 mg via INTRAVENOUS
  Filled 2012-05-24: qty 2

## 2012-05-24 MED ORDER — SODIUM CHLORIDE 0.9 % IJ SOLN
INTRAMUSCULAR | Status: AC
  Administered 2012-05-24: 3 mL
  Filled 2012-05-24: qty 3

## 2012-05-24 MED ORDER — HYDROMORPHONE HCL PF 1 MG/ML IJ SOLN
1.0000 mg | Freq: Once | INTRAMUSCULAR | Status: AC
  Administered 2012-05-24: 1 mg via INTRAVENOUS

## 2012-05-24 MED ORDER — ONDANSETRON HCL 4 MG/2ML IJ SOLN
4.0000 mg | Freq: Three times a day (TID) | INTRAMUSCULAR | Status: DC | PRN
  Filled 2012-05-24: qty 2

## 2012-05-24 MED ORDER — PROMETHAZINE HCL 25 MG/ML IJ SOLN: 25.0000 mg | Freq: Once | INTRAMUSCULAR | Status: DC

## 2012-05-24 MED ORDER — HYDROMORPHONE HCL PF 1 MG/ML IJ SOLN
1.0000 mg | INTRAMUSCULAR | Status: AC | PRN
  Administered 2012-05-24 (×3): 1 mg via INTRAVENOUS
  Filled 2012-05-24 (×3): qty 1

## 2012-05-24 NOTE — Care Management Note (Unsigned)
    Page 1 of 1   05/24/2012     2:10:03 PM   CARE MANAGEMENT NOTE 05/24/2012  Patient:  Nicole [name], Nicole [name]   Account Number:  [id number]  Date Initiated:  05/24/2012  Documentation initiated by:  Theophilus Kinds  Subjective/Objective Assessment:   Pt admitted from home with abd pain. Pt lives alone but has great family support who check on her very frequently. Pt will return home at discharge. Pt uses a cane for home use prn.     Action/Plan:   No CM [place] needs noted.   Anticipated DC Date:  05/25/2012   Anticipated DC Plan:  [place]  CM consult      Choice offered to / List presented to:             Status of service:  Completed, signed off Medicare Important Message given?   (If response is "NO", the following Medicare IM given date fields will be blank) Date Medicare IM given:   Date Additional Medicare IM given:    Discharge Disposition:    Per UR Regulation:    If discussed at Long Length of Stay Meetings, dates discussed:    Comments:  05/24/12 [place], RN BSN CM

## 2012-05-24 NOTE — ED Notes (Signed)
Pt waitong on metabolic panel to result before CT. First sample hemolized.

## 2012-05-24 NOTE — Progress Notes (Signed)
Pt received enema and MOM, she had small amt of diarrhea immediately following enema. States that she had a BM yesterday, 8/1 that was formed but soft, and she took a laxative on Wednesday 7/31, she had watery BM after that.

## 2012-05-24 NOTE — Progress Notes (Signed)
UR Chart Review Completed  

## 2012-05-24 NOTE — ED Provider Notes (Signed)
History     CSN: [id number]  Arrival date & time 05/24/12  0000   First MD Initiated Contact with Patient 05/24/12 [phone]      Chief Complaint  Patient presents with  . Flank Pain  . Back Pain    (Consider location/radiation/quality/duration/timing/severity/associated sxs/prior treatment) HPI Pt with history of spinal stenosis and back pain reports several hours of severe aching RLQ abdominal pain, associated with nausea and vomiting. No diarrhea, no dysuria or hematuria. The pain is moving into her R hip, but is different from prior episodes of radicular pain. No particular provoking or relieving factors. Unable to find a comfortable position in bed.   Past Medical History  Diagnosis Date  . Hypertension   . Gastroesophageal reflux   . Spinal stenosis     No past surgical history on file.  No family history on file.  History  Substance Use Topics  . Smoking status: Never Smoker   . Smokeless tobacco: Not on file  . Alcohol Use: No    OB History    Grav Para Term Preterm Abortions TAB SAB Ect Mult Living                  Review of Systems All other systems reviewed and are negative except as noted in HPI.   Allergies  Penicillins and Sulfa antibiotics  Home Medications   Current Outpatient Rx  Name Route Sig Dispense Refill  . MELOXICAM 15 MG PO TABS Oral Take 15 mg by mouth 2 (two) times daily.    [name] OMEPRAZOLE 20 MG PO CPDR Oral Take 20 mg by mouth daily.    . OXYCODONE-ACETAMINOPHEN 10-325 MG PO TABS Oral Take 1 tablet by mouth every 4 (four) hours as needed.    [name] VALSARTAN-HYDROCHLOROTHIAZIDE 160-25 MG PO TABS Oral Take 1 tablet by mouth daily.      BP 133/81  Pulse 73  Temp 98.6 F (37 C) (Oral)  Resp 22  SpO2 99%  Physical Exam  Nursing note and vitals reviewed. Constitutional: She is oriented to person, place, and time. She appears well-developed and well-nourished.       Uncomfortable appearing  HENT:  Head: Normocephalic and atraumatic.    Eyes: EOM are normal. Pupils are equal, round, and reactive to light.  Neck: Normal range of motion. Neck supple.  Cardiovascular: Normal rate, normal heart sounds and intact distal pulses.   Pulmonary/Chest: Effort normal and breath sounds normal.  Abdominal: Bowel sounds are normal. She exhibits no distension and no mass. There is tenderness (RLQ and R inguinal tenderness). There is guarding. There is no rebound.  Musculoskeletal: Normal range of motion. She exhibits no edema and no tenderness.  Neurological: She is alert and oriented to person, place, and time. She has normal strength. No cranial nerve deficit or sensory deficit.  Skin: Skin is warm and dry. No rash noted.  Psychiatric: She has a normal mood and affect.    ED Course  Procedures (including critical care time)  Labs Reviewed  URINALYSIS, ROUTINE W REFLEX MICROSCOPIC - Abnormal; Notable for the following:    APPearance HAZY (*)     Specific Gravity, Urine >1.030 (*)     All other components within normal limits  CBC WITH DIFFERENTIAL  COMPREHENSIVE METABOLIC PANEL  LIPASE, BLOOD   Ct Abdomen Pelvis W Contrast  05/24/2012  *RADIOLOGY REPORT*  Clinical Data: Right lower quadrant abdominal pain  CT ABDOMEN AND PELVIS WITH CONTRAST  Technique:  Multidetector CT imaging of the  abdomen and pelvis was performed following the standard protocol during bolus administration of intravenous contrast.  Contrast: 149mL OMNIPAQUE IOHEXOL 300 MG/ML  SOLN  Comparison: 10/26/2008  Findings: Bibasilar linear opacities, most in keeping with scarring or atelectasis.  Coronary artery calcification.  Hepatic steatosis.  Absent gallbladder.  No biliary ductal dilatation.  Unremarkable spleen, pancreas, adrenal glands.  Lobular renal contours with symmetric renal enhancement.  No hydronephrosis or hydroureter.  No bowel obstruction.  Colonic diverticulosis without CT evidence for colitis or diverticulitis.  The appendix is identified, measuring up to  8 mm in diameter.  No periappendiceal stranding. No free intraperitoneal air or fluid.  No lymphadenopathy.  There is scattered atherosclerotic calcification of the aorta and its branches. No aneurysmal dilatation.  Thin-walled bladder.  Unremarkable CT appearance to the uterus and adnexa. Mild mineralization associated within the left ovary is nonspecific.  Multilevel degenerative changes of the imaged spine. No acute or aggressive appearing osseous lesion.  IMPRESSION: The appendix is mildly prominent at 8 mm however there is no periappendiceal inflammation.  Correlate clinically if concerned for an early/mild acute appendicitis.  Hepatic steatosis.  Colonic diverticulosis.  Advanced multilevel degenerative changes of the thoracolumbar spine.  Original Report Authenticated By: Suanne Marker, M.D.     No diagnosis found.    MDM  Concern for appendicitis or other intraabdominal process. Given pain and nausea meds, will send for CT. UA neg for infection or blood.   4:31 AM Pain improved. Pt resting comfortably. Reviewed Imaging results with Dr. Benn Moulder, on call for General Surgery. He agrees with plan for observation and reassessment in the AM. Temp admit orders placed per his request.      Juanda Crumble B. Karle Starch, MD 05/24/12 [phone]

## 2012-05-24 NOTE — H&P (Signed)
Nicole [name] is an 66 y.o. female.   Chief Complaint: Right lower quadrant abdominal pain. HPI: Patient presented to [facility] emergency department last night early this morning with right quadrant abdominal pain. Patient states that the pain is actually been intermittent over the last approximate 2 weeks. No previous symptomatology similar to this. She states her bowel movements have been normal but on further discussion have been smaller in size and caliber. She denies any melena or hematochezia. No history of diarrhea. She has had some associated nausea and some episodes emesis last night at her presentation to the emergency department. She has had some chills associated with the pain but no documented fevers.  Diet has diminished although she has been able to tolerate oral intake. She denies any significant change with the pain with movement or diet. She denies any exacerbating features. She has new relieving factors that she is aware of. She denies any change with urination. No hematuria or dysuria. She has not had any sick contacts or unusual travel. She has not had previous colonoscopy. No significant family history of GI disease or cancers.  Past Medical History  Diagnosis Date  . Hypertension   . Gastroesophageal reflux   . Spinal stenosis     History reviewed. No pertinent past surgical history.  History reviewed. No pertinent family history. Social History:  reports that she has never smoked. She does not have any smokeless tobacco history on file. She reports that she does not drink alcohol or use illicit drugs.  Allergies:  Allergies  Allergen Reactions  . Penicillins   . Sulfa Antibiotics     Medications Prior to Admission  Medication Sig Dispense Refill  . meloxicam (MOBIC) 15 MG tablet Take 15 mg by mouth 2 (two) times daily.      [name] omeprazole (PRILOSEC) 20 MG capsule Take 20 mg by mouth daily.      [name] oxyCODONE-acetaminophen (PERCOCET) 10-325 MG per tablet Take 1  tablet by mouth every 4 (four) hours as needed.      . valsartan-hydrochlorothiazide (DIOVAN-HCT) 160-25 MG per tablet Take 1 tablet by mouth daily.        Results for orders placed during the hospital encounter of 05/24/12 (from the past 48 hour(s))  URINALYSIS, ROUTINE W REFLEX MICROSCOPIC     Status: Abnormal   Collection Time   05/24/12  1:12 AM      Component Value Range Comment   Color, Urine YELLOW  YELLOW    APPearance HAZY (*) CLEAR    Specific Gravity, Urine >1.030 (*) 1.005 - 1.030    pH 5.5  5.0 - 8.0    Glucose, UA NEGATIVE  NEGATIVE mg/dL    Hgb urine dipstick NEGATIVE  NEGATIVE    Bilirubin Urine NEGATIVE  NEGATIVE    Ketones, ur NEGATIVE  NEGATIVE mg/dL    Protein, ur NEGATIVE  NEGATIVE mg/dL    Urobilinogen, UA 0.2  0.0 - 1.0 mg/dL    Nitrite NEGATIVE  NEGATIVE    Leukocytes, UA NEGATIVE  NEGATIVE MICROSCOPIC NOT DONE ON URINES WITH NEGATIVE PROTEIN, BLOOD, LEUKOCYTES, NITRITE, OR GLUCOSE <1000 mg/dL.  CBC WITH DIFFERENTIAL     Status: Normal   Collection Time   05/24/12  1:33 AM      Component Value Range Comment   WBC 10.2  4.0 - 10.5 K/uL    RBC 4.27  3.87 - 5.11 MIL/uL    Hemoglobin 12.6  12.0 - 15.0 g/dL    HCT 37.4  36.0 - 46.0 %    MCV 87.6  78.0 - 100.0 fL    MCH 29.5  26.0 - 34.0 pg    MCHC 33.7  30.0 - 36.0 g/dL    RDW 14.1  11.5 - 15.5 %    Platelets 323  150 - 400 K/uL    Neutrophils Relative 67  43 - 77 %    Neutro Abs 6.9  1.7 - 7.7 K/uL    Lymphocytes Relative 24  12 - 46 %    Lymphs Abs 2.4  0.7 - 4.0 K/uL    Monocytes Relative 7  3 - 12 %    Monocytes Absolute 0.7  0.1 - 1.0 K/uL    Eosinophils Relative 2  0 - 5 %    Eosinophils Absolute 0.2  0.0 - 0.7 K/uL    Basophils Relative 0  0 - 1 %    Basophils Absolute 0.0  0.0 - 0.1 K/uL   COMPREHENSIVE METABOLIC PANEL     Status: Abnormal   Collection Time   05/24/12  1:33 AM      Component Value Range Comment   Sodium 135  135 - 145 mEq/L    Potassium 3.6  3.5 - 5.1 mEq/L    Chloride 102  96 -  112 mEq/L    CO2 22  19 - 32 mEq/L    Glucose, Bld 125 (*) 70 - 99 mg/dL    BUN 19  6 - 23 mg/dL    Creatinine, Ser 0.84  0.50 - 1.10 mg/dL    Calcium 9.4  8.4 - 10.5 mg/dL    Total Protein 7.3  6.0 - 8.3 g/dL    Albumin 3.5  3.5 - 5.2 g/dL    AST 19  0 - 37 U/L    ALT 31  0 - 35 U/L    Alkaline Phosphatase 124 (*) 39 - 117 U/L    Total Bilirubin 0.3  0.3 - 1.2 mg/dL    GFR calc non Af Amer 71 (*) >90 mL/min    GFR calc Af Amer 83 (*) >90 mL/min   LIPASE, BLOOD     Status: Normal   Collection Time   05/24/12  1:33 AM      Component Value Range Comment   Lipase 27  11 - 59 U/L    Ct Abdomen Pelvis W Contrast  05/24/2012  *RADIOLOGY REPORT*  Clinical Data: Right lower quadrant abdominal pain  CT ABDOMEN AND PELVIS WITH CONTRAST  Technique:  Multidetector CT imaging of the abdomen and pelvis was performed following the standard protocol during bolus administration of intravenous contrast.  Contrast: 154mL OMNIPAQUE IOHEXOL 300 MG/ML  SOLN  Comparison: 10/26/2008  Findings: Bibasilar linear opacities, most in keeping with scarring or atelectasis.  Coronary artery calcification.  Hepatic steatosis.  Absent gallbladder.  No biliary ductal dilatation.  Unremarkable spleen, pancreas, adrenal glands.  Lobular renal contours with symmetric renal enhancement.  No hydronephrosis or hydroureter.  No bowel obstruction.  Colonic diverticulosis without CT evidence for colitis or diverticulitis.  The appendix is identified, measuring up to 8 mm in diameter.  No periappendiceal stranding. No free intraperitoneal air or fluid.  No lymphadenopathy.  There is scattered atherosclerotic calcification of the aorta and its branches. No aneurysmal dilatation.  Thin-walled bladder.  Unremarkable CT appearance to the uterus and adnexa. Mild mineralization associated within the left ovary is nonspecific.  Multilevel degenerative changes of the imaged spine. No acute or aggressive appearing osseous lesion.  IMPRESSION: The  appendix is mildly prominent at 8 mm however there is no periappendiceal inflammation.  Correlate clinically if concerned for an early/mild acute appendicitis.  Hepatic steatosis.  Colonic diverticulosis.  Advanced multilevel degenerative changes of the thoracolumbar spine.  Original Report Authenticated By: Suanne Marker, M.D.    Review of Systems  Constitutional: Positive for chills. Negative for fever, weight loss, malaise/fatigue and diaphoresis.  HENT: Negative.   Eyes: Negative.   Respiratory: Negative.   Cardiovascular: Negative.   Gastrointestinal: Positive for heartburn, nausea, vomiting (last night), abdominal pain (right lower quadrant x2 weeks) and constipation. Negative for diarrhea, blood in stool and melena.  Genitourinary: Negative.   Musculoskeletal: Negative.   Skin: Negative.   Neurological: Negative.  Negative for weakness.  Endo/Heme/Allergies: Negative.   Psychiatric/Behavioral: Negative.     Blood pressure 127/84, pulse 74, temperature 97.7 F (36.5 C), temperature source Oral, resp. rate 20, height 5\' 2"  (1.575 m), weight 116.574 kg (257 lb), SpO2 94.00%. Physical Exam  Constitutional: She is oriented to person, place, and time. She appears well-developed and well-nourished. No distress.       Morbidly obese  HENT:  Head: Normocephalic and atraumatic.  Eyes: Conjunctivae and EOM are normal. Pupils are equal, round, and reactive to light. No scleral icterus.  Neck: Normal range of motion. Neck supple. No tracheal deviation present. No thyromegaly present.  Cardiovascular: Normal rate, regular rhythm and normal heart sounds.   Respiratory: Effort normal and breath sounds normal. No respiratory distress.  GI: Soft. Bowel sounds are normal. She exhibits no distension and no mass. There is tenderness (moderate right lower quadrant tenderness. No Rovsing sign. No rebound. No peritoneal signs. Patient also has some epigastric abdominal tenderness to deep palpation).  There is no rebound and no guarding.  Lymphadenopathy:    She has no cervical adenopathy.  Neurological: She is alert and oriented to person, place, and time.  Skin: Skin is warm and dry.     Assessment/Plan Right lower quadrant abdominal pain. At this time I have an extremely low suspicion of acute appendicitis. Based on her clinical course and early acute appendicitis to be extremely unlikely however a repeat of her CBC will be obtained this morning to confirm a normal white blood cell count. As discussed with the patient and family should this be elevated then we may need to discuss possible diagnostic laparoscopy and appendectomy however again at this point my suspicion is extremely low the we will proceed proceeding to the operating room. If her white blood cell count is in fact normal with no evidence of a shift then at this time I do feel that this may be related to a inflammatory colitis, possible obstipation, and or constipation. If her white blood cell count is normal I will start her back on a clear liquid and advance her diet today if she tolerates it. We'll also start a: Washout from both above and below and see if this alleviates some of her abdominal discomfort. At this point the patient does not have an acute surgical findings.  Ziggy Chanthavong C 05/24/2012, 11:07 AM

## 2012-05-24 NOTE — ED Notes (Signed)
Pain in right lower abd, around to right flank and into lower back

## 2012-05-24 NOTE — Progress Notes (Addendum)
Pt got up to go to the restroom, she stopped and had 1 large episode of emesis. Pt is c/o a bad headache that just recently came up. Pt has received dilaudid and zofran at 0624. Dr. Geroge Baseman paged to notify him of this. Received order for Phenergan 25mg , dose limited per protocol to 12.5mg  due to pts age.  Marry Guan

## 2012-05-24 NOTE — Progress Notes (Signed)
Pt states that she does have a living will. No copy here at this time. I asked her to please speak with her children and see if she could bring a copy of her living will here to the hospital. Araceli Bouche, Glynis Smiles

## 2012-05-24 NOTE — ED Notes (Signed)
Pt still complaining of 10/10 pain to R lower abd. Dr Karle Starch notified and 1mg  dilauded given per order.

## 2012-05-25 MED ORDER — KETOROLAC TROMETHAMINE 30 MG/ML IJ SOLN
15.0000 mg | Freq: Once | INTRAMUSCULAR | Status: AC
  Administered 2012-05-25: 15 mg via INTRAVENOUS
  Filled 2012-05-25 (×2): qty 1

## 2012-05-25 MED ORDER — SODIUM CHLORIDE 0.9 % IJ SOLN
INTRAMUSCULAR | Status: AC
  Filled 2012-05-25: qty 3

## 2012-05-25 NOTE — Progress Notes (Signed)
  Subjective: Pain better but still present in the right lower quadrant. Patient has had some loose stools with the bowel regimen. No nausea no fevers or chills. Tolerating liquids.  Objective: Vital signs in last 24 hours: Temp:  [98.3 F (36.8 C)-99.3 F (37.4 C)] 98.3 F (36.8 C) (08/03 0551) Pulse Rate:  [70-78] 78  (08/03 0551) Resp:  [16-18] 16  (08/03 0551) BP: (92-121)/(65-79) 121/79 mmHg (08/03 0551) SpO2:  [92 %-95 %] 95 % (08/03 0551) Last BM Date: 05/22/12  Intake/Output from previous day: 08/02 0701 - 08/03 0700 In: 1313.3 [P.O.:480; I.V.:833.3] Out: 300 [Urine:300] Intake/Output this shift: Total I/O In: 480 [P.O.:480] Out: -   General appearance: alert and no distress GI: Positive bowel sounds, obese, soft, mild to moderate right lower quadrant abdominal tenderness. No peritoneal signs. No hernias or masses.  Lab Results:   Basename 05/24/12 1053 05/24/12 0133  WBC 9.0 10.2  HGB 11.6* 12.6  HCT 34.8* 37.4  PLT 309 323   BMET  Basename 05/24/12 0133  NA 135  K 3.6  CL 102  CO2 22  GLUCOSE 125*  BUN 19  CREATININE 0.84  CALCIUM 9.4   PT/INR No results found for this basename: LABPROT:2,INR:2 in the last 72 hours ABG No results found for this basename: PHART:2,PCO2:2,PO2:2,HCO3:2 in the last 72 hours  Studies/Results: Ct Abdomen Pelvis W Contrast  05/24/2012  *RADIOLOGY REPORT*  Clinical Data: Right lower quadrant abdominal pain  CT ABDOMEN AND PELVIS WITH CONTRAST  Technique:  Multidetector CT imaging of the abdomen and pelvis was performed following the standard protocol during bolus administration of intravenous contrast.  Contrast: 168mL OMNIPAQUE IOHEXOL 300 MG/ML  SOLN  Comparison: 10/26/2008  Findings: Bibasilar linear opacities, most in keeping with scarring or atelectasis.  Coronary artery calcification.  Hepatic steatosis.  Absent gallbladder.  No biliary ductal dilatation.  Unremarkable spleen, pancreas, adrenal glands.  Lobular renal  contours with symmetric renal enhancement.  No hydronephrosis or hydroureter.  No bowel obstruction.  Colonic diverticulosis without CT evidence for colitis or diverticulitis.  The appendix is identified, measuring up to 8 mm in diameter.  No periappendiceal stranding. No free intraperitoneal air or fluid.  No lymphadenopathy.  There is scattered atherosclerotic calcification of the aorta and its branches. No aneurysmal dilatation.  Thin-walled bladder.  Unremarkable CT appearance to the uterus and adnexa. Mild mineralization associated within the left ovary is nonspecific.  Multilevel degenerative changes of the imaged spine. No acute or aggressive appearing osseous lesion.  IMPRESSION: The appendix is mildly prominent at 8 mm however there is no periappendiceal inflammation.  Correlate clinically if concerned for an early/mild acute appendicitis.  Hepatic steatosis.  Colonic diverticulosis.  Advanced multilevel degenerative changes of the thoracolumbar spine.  Original Report Authenticated By: Suanne Marker, M.D.    Anti-infectives: Anti-infectives    None      Assessment/Plan: s/p * No surgery found * Right lower quadrant pain. Likely a inflammatory process. No evidence of any acute infectious etiology. I do suspect there is a component of obstipation constipation associated with this. Continue bowel regimen. Continue to advance diet. I will discharge later this afternoon.  LOS: 1 day    Yardley Beltran C 05/25/2012

## 2012-05-25 NOTE — Progress Notes (Signed)
Patient was complaining of pain and nausea. Patient had no PRN medicines. Dr Tobe Sos was called and new orders were given to start her back on Dilaudid PRN and Zofran PRN. Will continue to monitor.

## 2012-05-25 NOTE — Plan of Care (Signed)
Problem: Phase I Progression Outcomes Goal: OOB as tolerated unless otherwise ordered Outcome: Completed/Met Date Met:  05/25/12 Patient ambulated in the hall with the help of a nurse tech. At home, patient uses cane

## 2012-05-25 NOTE — Progress Notes (Signed)
Patient was given discharge instructions and she signed them. Patient was stable upon discharge. No prescriptions were given from the doctor. IV was removed. Patient left the hospital with daughter and other family members. Patient was wheeled down to the front of the hospital by the nurse tech.

## 2012-05-28 DIAGNOSIS — E669 Obesity, unspecified: Secondary | ICD-10-CM | POA: Diagnosis not present

## 2012-05-28 DIAGNOSIS — R109 Unspecified abdominal pain: Secondary | ICD-10-CM | POA: Diagnosis not present

## 2012-05-28 DIAGNOSIS — Z713 Dietary counseling and surveillance: Secondary | ICD-10-CM | POA: Diagnosis not present

## 2012-05-28 DIAGNOSIS — Z7182 Exercise counseling: Secondary | ICD-10-CM | POA: Diagnosis not present

## 2012-06-06 DIAGNOSIS — Z1211 Encounter for screening for malignant neoplasm of colon: Secondary | ICD-10-CM | POA: Diagnosis not present

## 2012-06-06 NOTE — H&P (Signed)
  NTS SOAP Note  Vital Signs:  Vitals as of: Q000111Q: Systolic 123456: Diastolic 87: Heart Rate 89: Temp 96.84F: Height 16ft 2in: Weight 255Lbs 0 Ounces: Pain Level 4: BMI 47  BMI : 46.64 kg/m2  Subjective: This 38 Years 2 Months old Female presents for screening TCS.  Denies any ongoing GI complaints.  No family h/o colon carcinoma.   Review of Symptoms:  Constitutional:unremarkable   Head:unremarkable    Eyes:unremarkable   Nose/Mouth/Throat:unremarkable Cardiovascular:  unremarkable   Respiratory:unremarkable   Gastrointestinal:  unremarkable   Genitourinary:unremarkable       needs cane to walk Skin:unremarkable Hematolgic/Lymphatic:unremarkable     Allergic/Immunologic:unremarkable     Past Medical History:    Reviewed   Past Medical History  Medical Problems:  Asthma, High Blood pressure Allergies: oxycontin, sulfa, PCN Medications: zofran, albuterol, mobic, prilosec, diovan, vit d, asa, oxycodone, percocet   Social History:Reviewed  Social History  Preferred Language: English ([place]) Race:  Black or African American Ethnicity: Not Hispanic / Latino Age: 7 Years 2 Months Marital Status:  M Alcohol:  No Recreational drug(s):  No   Smoking Status: Never smoker reviewed on 06/06/2012  Family History:  Reviewed   Family History  Is there a family history of:No family h/o colon cancer    Objective Information:   Walks with a cane Neck:  Supple without lymphadenopathy.  Heart:  RRR, no murmur Lungs:    CTA bilaterally, no wheezes, rhonchi, rales.  Breathing unlabored. Abdomen:Soft, NT/ND, no HSM, no masses.   deferred to procedure  Assessment:Need for screening TCS  Diagnosis &amp; Procedure: DiagnosisCode: V76.51, ProcedureCodeRL:[id number],    Plan:Scheduled for screening TCS on 06/11/12.   Patient Education:Alternative treatments to surgery were discussed with patient  (and family).  Risks and benefits  of procedure were fully explained to the patient (and family) who gave informed consent. Patient/family questions were addressed.  Follow-up:Pending Surgery

## 2012-06-07 ENCOUNTER — Encounter (HOSPITAL_COMMUNITY): Payer: Self-pay | Admitting: Pharmacy Technician

## 2012-06-10 MED ORDER — SODIUM CHLORIDE 0.9 % IV SOLN: INTRAVENOUS | Status: DC

## 2012-06-10 MED ORDER — SODIUM CHLORIDE 0.45 % IV SOLN
INTRAVENOUS | Status: DC
  Administered 2012-06-11: 08:00:00 via INTRAVENOUS

## 2012-06-11 ENCOUNTER — Encounter (HOSPITAL_COMMUNITY): Payer: Self-pay | Admitting: *Deleted

## 2012-06-11 ENCOUNTER — Ambulatory Visit (HOSPITAL_COMMUNITY)
Admission: RE | Admit: 2012-06-11 | Discharge: 2012-06-11 | Disposition: A | Discharge status: Home / Self Care | Payer: Medicare Other | Source: Ambulatory Visit | Attending: General Surgery | Admitting: General Surgery

## 2012-06-11 ENCOUNTER — Encounter (HOSPITAL_COMMUNITY)
Admission: RE | Disposition: A | Discharge status: Home / Self Care | Payer: Self-pay | Source: Ambulatory Visit | Attending: General Surgery

## 2012-06-11 DIAGNOSIS — Z1211 Encounter for screening for malignant neoplasm of colon: Secondary | ICD-10-CM | POA: Insufficient documentation

## 2012-06-11 DIAGNOSIS — I10 Essential (primary) hypertension: Secondary | ICD-10-CM | POA: Insufficient documentation

## 2012-06-11 DIAGNOSIS — K5730 Diverticulosis of large intestine without perforation or abscess without bleeding: Secondary | ICD-10-CM | POA: Insufficient documentation

## 2012-06-11 HISTORY — DX: Other shoulder lesions, unspecified shoulder: M75.80

## 2012-06-11 HISTORY — DX: Unspecified osteoarthritis, unspecified site: M19.90

## 2012-06-11 HISTORY — DX: Carpal tunnel syndrome, bilateral upper limbs: G56.03

## 2012-06-11 HISTORY — PX: COLONOSCOPY: SHX5424

## 2012-06-11 SURGERY — COLONOSCOPY
Anesthesia: Moderate Sedation

## 2012-06-11 MED ORDER — MEPERIDINE HCL 100 MG/ML IJ SOLN
INTRAMUSCULAR | Status: AC
  Filled 2012-06-11: qty 1

## 2012-06-11 MED ORDER — MIDAZOLAM HCL 5 MG/5ML IJ SOLN
INTRAMUSCULAR | Status: AC
  Filled 2012-06-11: qty 10

## 2012-06-11 MED ORDER — MIDAZOLAM HCL 5 MG/5ML IJ SOLN
INTRAMUSCULAR | Status: DC | PRN
  Administered 2012-06-11: 3 mg via INTRAVENOUS
  Administered 2012-06-11: 1 mg via INTRAVENOUS

## 2012-06-11 MED ORDER — MEPERIDINE HCL 25 MG/ML IJ SOLN
INTRAMUSCULAR | Status: DC | PRN
  Administered 2012-06-11: 50 mg via INTRAVENOUS

## 2012-06-11 NOTE — Interval H&P Note (Signed)
History and Physical Interval Note:  06/11/2012 8:28 AM  Nicole [name]  has presented today for surgery, with the diagnosis of Special screening for malignant neoplasms, colon  The various methods of treatment have been discussed with the patient and family. After consideration of risks, benefits and other options for treatment, the patient has consented to  Procedure(s) (LRB): COLONOSCOPY (N/A) as a surgical intervention .  The patient's history has been reviewed, patient examined, no change in status, stable for surgery.  I have reviewed the patient's chart and labs.  Questions were answered to the patient's satisfaction.     Aviva Signs A

## 2012-06-11 NOTE — Op Note (Signed)
[facility] [address] [place], [address]   COLONOSCOPY PROCEDURE REPORT  PATIENT: Nicole [name], Nicole [name]  MR#: [id number] BIRTHDATE: 1946/06/02 , 66  yrs. old GENDER: Female ENDOSCOPIST: Aviva Signs, MD REFERRED [id number], William PROCEDURE DATE:  06/11/2012 PROCEDURE:   Colonoscopy, screening ASA CLASS:   Class II INDICATIONS:average risk patient for colon cancer. MEDICATIONS: mcg IV  DESCRIPTION OF PROCEDURE:   After the risks benefits and alternatives of the procedure were thoroughly explained, informed consent was obtained.  A digital rectal exam revealed no abnormalities of the rectum.   The Pentax Colonoscope [id number] endoscope was introduced through the anus  and advanced to the cecum, which was identified by both the appendix and ileocecal valve , limited by No adverse events experienced.   The quality of the prep was adequate, using Trilyte . The instrument was then slowly withdrawn as the colon was fully examined.      COLON FINDINGS: Diverticulum was found in the descending colon. Retroflexed views revealed no abnormalities.  The time to cecum = 3 minutes . The scope was then withdrawn in 5 minutes  minutes from the cecum and the procedure completed. COMPLICATIONS: There were no complications. ENDOSCOPIC IMPRESSION: Diverticulum in the descending colon  RECOMMENDATIONS: Repeat Colonscopy in 10 years.  eSigned:  Aviva Signs, MD 06/11/2012 8:49 AM   cc:Elsie Lincoln, MD

## 2012-06-14 ENCOUNTER — Encounter (HOSPITAL_COMMUNITY): Payer: Self-pay | Admitting: General Surgery

## 2012-08-21 DIAGNOSIS — R5381 Other malaise: Secondary | ICD-10-CM | POA: Diagnosis not present

## 2012-08-21 DIAGNOSIS — R7309 Other abnormal glucose: Secondary | ICD-10-CM | POA: Diagnosis not present

## 2012-08-21 DIAGNOSIS — R42 Dizziness and giddiness: Secondary | ICD-10-CM | POA: Diagnosis not present

## 2012-08-21 DIAGNOSIS — Z6841 Body Mass Index (BMI) 40.0 and over, adult: Secondary | ICD-10-CM | POA: Diagnosis not present

## 2012-08-26 DIAGNOSIS — R5381 Other malaise: Secondary | ICD-10-CM | POA: Diagnosis not present

## 2012-08-26 DIAGNOSIS — R5383 Other fatigue: Secondary | ICD-10-CM | POA: Diagnosis not present

## 2012-08-26 DIAGNOSIS — R197 Diarrhea, unspecified: Secondary | ICD-10-CM | POA: Diagnosis not present

## 2012-08-26 DIAGNOSIS — M069 Rheumatoid arthritis, unspecified: Secondary | ICD-10-CM | POA: Diagnosis not present

## 2012-09-23 DIAGNOSIS — M67919 Unspecified disorder of synovium and tendon, unspecified shoulder: Secondary | ICD-10-CM | POA: Diagnosis not present

## 2012-09-23 DIAGNOSIS — G4700 Insomnia, unspecified: Secondary | ICD-10-CM | POA: Diagnosis not present

## 2012-09-23 DIAGNOSIS — M159 Polyosteoarthritis, unspecified: Secondary | ICD-10-CM | POA: Diagnosis not present

## 2012-09-23 DIAGNOSIS — M545 Low back pain, unspecified: Secondary | ICD-10-CM | POA: Diagnosis not present

## 2012-09-23 DIAGNOSIS — M719 Bursopathy, unspecified: Secondary | ICD-10-CM | POA: Diagnosis not present

## 2012-10-07 DIAGNOSIS — M25569 Pain in unspecified knee: Secondary | ICD-10-CM | POA: Diagnosis not present

## 2012-10-07 DIAGNOSIS — M545 Low back pain, unspecified: Secondary | ICD-10-CM | POA: Diagnosis not present

## 2012-10-07 DIAGNOSIS — M67919 Unspecified disorder of synovium and tendon, unspecified shoulder: Secondary | ICD-10-CM | POA: Diagnosis not present

## 2012-10-07 DIAGNOSIS — M542 Cervicalgia: Secondary | ICD-10-CM | POA: Diagnosis not present

## 2012-10-07 DIAGNOSIS — M719 Bursopathy, unspecified: Secondary | ICD-10-CM | POA: Diagnosis not present

## 2012-11-07 DIAGNOSIS — M25569 Pain in unspecified knee: Secondary | ICD-10-CM | POA: Diagnosis not present

## 2012-11-07 DIAGNOSIS — M67919 Unspecified disorder of synovium and tendon, unspecified shoulder: Secondary | ICD-10-CM | POA: Diagnosis not present

## 2012-11-07 DIAGNOSIS — M2550 Pain in unspecified joint: Secondary | ICD-10-CM | POA: Diagnosis not present

## 2012-11-07 DIAGNOSIS — G4700 Insomnia, unspecified: Secondary | ICD-10-CM | POA: Diagnosis not present

## 2012-11-25 DIAGNOSIS — I10 Essential (primary) hypertension: Secondary | ICD-10-CM | POA: Diagnosis not present

## 2012-11-25 DIAGNOSIS — R7301 Impaired fasting glucose: Secondary | ICD-10-CM | POA: Diagnosis not present

## 2012-11-25 DIAGNOSIS — Z6841 Body Mass Index (BMI) 40.0 and over, adult: Secondary | ICD-10-CM | POA: Diagnosis not present

## 2013-02-17 DIAGNOSIS — M25519 Pain in unspecified shoulder: Secondary | ICD-10-CM | POA: Diagnosis not present

## 2013-02-17 DIAGNOSIS — M25549 Pain in joints of unspecified hand: Secondary | ICD-10-CM | POA: Diagnosis not present

## 2013-02-17 DIAGNOSIS — M159 Polyosteoarthritis, unspecified: Secondary | ICD-10-CM | POA: Diagnosis not present

## 2013-04-14 DIAGNOSIS — M159 Polyosteoarthritis, unspecified: Secondary | ICD-10-CM | POA: Diagnosis not present

## 2013-04-14 DIAGNOSIS — M67919 Unspecified disorder of synovium and tendon, unspecified shoulder: Secondary | ICD-10-CM | POA: Diagnosis not present

## 2013-04-14 DIAGNOSIS — M719 Bursopathy, unspecified: Secondary | ICD-10-CM | POA: Diagnosis not present

## 2013-04-14 DIAGNOSIS — G5600 Carpal tunnel syndrome, unspecified upper limb: Secondary | ICD-10-CM | POA: Diagnosis not present

## 2013-05-05 DIAGNOSIS — Z6841 Body Mass Index (BMI) 40.0 and over, adult: Secondary | ICD-10-CM | POA: Diagnosis not present

## 2013-05-05 DIAGNOSIS — I10 Essential (primary) hypertension: Secondary | ICD-10-CM | POA: Diagnosis not present

## 2013-05-05 DIAGNOSIS — M5430 Sciatica, unspecified side: Secondary | ICD-10-CM | POA: Diagnosis not present

## 2013-05-05 DIAGNOSIS — E669 Obesity, unspecified: Secondary | ICD-10-CM | POA: Diagnosis not present

## 2013-05-15 DIAGNOSIS — G894 Chronic pain syndrome: Secondary | ICD-10-CM | POA: Diagnosis not present

## 2013-05-15 DIAGNOSIS — M48061 Spinal stenosis, lumbar region without neurogenic claudication: Secondary | ICD-10-CM | POA: Diagnosis not present

## 2013-05-15 DIAGNOSIS — M545 Low back pain, unspecified: Secondary | ICD-10-CM | POA: Diagnosis not present

## 2013-06-04 DIAGNOSIS — M545 Low back pain, unspecified: Secondary | ICD-10-CM | POA: Diagnosis not present

## 2013-06-24 DIAGNOSIS — M545 Low back pain, unspecified: Secondary | ICD-10-CM | POA: Diagnosis not present

## 2013-07-23 DIAGNOSIS — M545 Low back pain, unspecified: Secondary | ICD-10-CM | POA: Diagnosis not present

## 2013-08-18 DIAGNOSIS — M25569 Pain in unspecified knee: Secondary | ICD-10-CM | POA: Diagnosis not present

## 2013-08-18 DIAGNOSIS — M4800 Spinal stenosis, site unspecified: Secondary | ICD-10-CM | POA: Diagnosis not present

## 2013-08-18 DIAGNOSIS — M159 Polyosteoarthritis, unspecified: Secondary | ICD-10-CM | POA: Diagnosis not present

## 2013-08-18 DIAGNOSIS — M67919 Unspecified disorder of synovium and tendon, unspecified shoulder: Secondary | ICD-10-CM | POA: Diagnosis not present

## 2013-08-20 ENCOUNTER — Emergency Department (HOSPITAL_COMMUNITY): Payer: Medicare Other

## 2013-08-20 ENCOUNTER — Emergency Department (HOSPITAL_COMMUNITY)
Admission: EM | Admit: 2013-08-20 | Discharge: 2013-08-20 | Disposition: A | Discharge status: Home / Self Care | Payer: Medicare Other | Attending: Emergency Medicine | Admitting: Emergency Medicine

## 2013-08-20 ENCOUNTER — Encounter (HOSPITAL_COMMUNITY): Payer: Self-pay | Admitting: Emergency Medicine

## 2013-08-20 DIAGNOSIS — I10 Essential (primary) hypertension: Secondary | ICD-10-CM | POA: Insufficient documentation

## 2013-08-20 DIAGNOSIS — M129 Arthropathy, unspecified: Secondary | ICD-10-CM | POA: Insufficient documentation

## 2013-08-20 DIAGNOSIS — K5730 Diverticulosis of large intestine without perforation or abscess without bleeding: Secondary | ICD-10-CM | POA: Diagnosis not present

## 2013-08-20 DIAGNOSIS — R1013 Epigastric pain: Secondary | ICD-10-CM | POA: Insufficient documentation

## 2013-08-20 DIAGNOSIS — Z79899 Other long term (current) drug therapy: Secondary | ICD-10-CM | POA: Insufficient documentation

## 2013-08-20 DIAGNOSIS — Z8669 Personal history of other diseases of the nervous system and sense organs: Secondary | ICD-10-CM | POA: Diagnosis not present

## 2013-08-20 DIAGNOSIS — Z791 Long term (current) use of non-steroidal anti-inflammatories (NSAID): Secondary | ICD-10-CM | POA: Insufficient documentation

## 2013-08-20 DIAGNOSIS — R079 Chest pain, unspecified: Secondary | ICD-10-CM | POA: Insufficient documentation

## 2013-08-20 DIAGNOSIS — K2970 Gastritis, unspecified, without bleeding: Secondary | ICD-10-CM | POA: Insufficient documentation

## 2013-08-20 DIAGNOSIS — K2900 Acute gastritis without bleeding: Secondary | ICD-10-CM | POA: Diagnosis not present

## 2013-08-20 DIAGNOSIS — R5381 Other malaise: Secondary | ICD-10-CM | POA: Insufficient documentation

## 2013-08-20 DIAGNOSIS — K219 Gastro-esophageal reflux disease without esophagitis: Secondary | ICD-10-CM | POA: Insufficient documentation

## 2013-08-20 LAB — HEPATIC FUNCTION PANEL
ALT: 31 U/L (ref 0–35)
AST: 25 U/L (ref 0–37)
Albumin: 3.7 g/dL (ref 3.5–5.2)
Alkaline Phosphatase: 115 U/L (ref 39–117)
Bilirubin, Direct: <0.1 mg/dL (ref 0.0–0.3)
Total Bilirubin: 0.5 mg/dL (ref 0.3–1.2)
Total Protein: 7.8 g/dL (ref 6.0–8.3)

## 2013-08-20 LAB — BASIC METABOLIC PANEL
BUN: 11 mg/dL (ref 6–23)
CO2: 27 mEq/L (ref 19–32)
Calcium: 10.1 mg/dL (ref 8.4–10.5)
Chloride: 94 mEq/L — ABNORMAL LOW (ref 96–112)
Creatinine, Ser: 0.85 mg/dL (ref 0.50–1.10)
GFR calc Af Amer: 80 mL/min — ABNORMAL LOW (ref >90)
GFR calc non Af Amer: 69 mL/min — ABNORMAL LOW (ref >90)
Glucose, Bld: 105 mg/dL — ABNORMAL HIGH (ref 70–99)
Potassium: 3.6 mEq/L (ref 3.5–5.1)
Sodium: 133 mEq/L — ABNORMAL LOW (ref 135–145)

## 2013-08-20 LAB — CBC WITH DIFFERENTIAL/PLATELET
Basophils Absolute: 0 10*3/uL (ref 0.0–0.1)
Basophils Relative: 0 % (ref 0–1)
Eosinophils Absolute: 0.1 10*3/uL (ref 0.0–0.7)
Eosinophils Relative: 1 % (ref 0–5)
HCT: 37.9 % (ref 36.0–46.0)
Hemoglobin: 13.1 g/dL (ref 12.0–15.0)
Lymphocytes Relative: 29 % (ref 12–46)
Lymphs Abs: 2.1 10*3/uL (ref 0.7–4.0)
MCH: 30.1 pg (ref 26.0–34.0)
MCHC: 34.6 g/dL (ref 30.0–36.0)
MCV: 87.1 fL (ref 78.0–100.0)
Monocytes Absolute: 0.6 10*3/uL (ref 0.1–1.0)
Monocytes Relative: 8 % (ref 3–12)
Neutro Abs: 4.3 10*3/uL (ref 1.7–7.7)
Neutrophils Relative %: 61 % (ref 43–77)
Platelets: 355 10*3/uL (ref 150–400)
RBC: 4.35 MIL/uL (ref 3.87–5.11)
RDW: 13.5 % (ref 11.5–15.5)
WBC: 7.1 10*3/uL (ref 4.0–10.5)

## 2013-08-20 LAB — D-DIMER, QUANTITATIVE: D-Dimer, Quant: 0.47 ug/mL-FEU (ref 0.00–0.48)

## 2013-08-20 LAB — TROPONIN I
Troponin I: <0.3 ng/mL (ref <0.30)
Troponin I: <0.3 ng/mL (ref <0.30)

## 2013-08-20 LAB — LIPASE, BLOOD: Lipase: 27 U/L (ref 11–59)

## 2013-08-20 MED ORDER — HYDROMORPHONE HCL 4 MG PO TABS: 4.0000 mg | ORAL_TABLET | Freq: Four times a day (QID) | ORAL | Status: DC | PRN

## 2013-08-20 MED ORDER — ONDANSETRON 4 MG PO TBDP: ORAL_TABLET | ORAL | Status: DC

## 2013-08-20 MED ORDER — SODIUM CHLORIDE 0.9 % IV SOLN: Freq: Once | INTRAVENOUS | Status: AC

## 2013-08-20 MED ORDER — PANTOPRAZOLE SODIUM 40 MG PO TBEC: 40.0000 mg | DELAYED_RELEASE_TABLET | Freq: Every day | ORAL | Status: DC

## 2013-08-20 MED ORDER — ONDANSETRON HCL 4 MG/2ML IJ SOLN
INTRAMUSCULAR | Status: AC
  Administered 2013-08-20: 4 mg via INTRAVENOUS
  Filled 2013-08-20: qty 2

## 2013-08-20 MED ORDER — LORAZEPAM 2 MG/ML IJ SOLN
0.5000 mg | Freq: Once | INTRAMUSCULAR | Status: AC
  Administered 2013-08-20: 0.5 mg via INTRAVENOUS
  Filled 2013-08-20: qty 1

## 2013-08-20 MED ORDER — ONDANSETRON HCL 4 MG/2ML IJ SOLN
4.0000 mg | Freq: Once | INTRAMUSCULAR | Status: AC
  Administered 2013-08-20 (×2): 4 mg via INTRAVENOUS
  Filled 2013-08-20: qty 2

## 2013-08-20 MED ORDER — IOHEXOL 300 MG/ML  SOLN
50.0000 mL | Freq: Once | INTRAMUSCULAR | Status: AC | PRN
  Administered 2013-08-20: 50 mL via ORAL

## 2013-08-20 MED ORDER — PANTOPRAZOLE SODIUM 40 MG IV SOLR
40.0000 mg | Freq: Once | INTRAVENOUS | Status: AC
  Administered 2013-08-20: 40 mg via INTRAVENOUS
  Filled 2013-08-20: qty 40

## 2013-08-20 MED ORDER — IOHEXOL 300 MG/ML  SOLN
100.0000 mL | Freq: Once | INTRAMUSCULAR | Status: AC | PRN
  Administered 2013-08-20: 100 mL via INTRAVENOUS

## 2013-08-20 MED ORDER — HYDROMORPHONE HCL PF 1 MG/ML IJ SOLN
1.0000 mg | Freq: Once | INTRAMUSCULAR | Status: AC
  Administered 2013-08-20: 1 mg via INTRAVENOUS
  Filled 2013-08-20: qty 1

## 2013-08-20 MED ORDER — ONDANSETRON HCL 4 MG/2ML IJ SOLN: 4.0000 mg | Freq: Once | INTRAMUSCULAR | Status: AC

## 2013-08-20 MED ORDER — ONDANSETRON HCL 4 MG/2ML IJ SOLN
4.0000 mg | Freq: Once | INTRAMUSCULAR | Status: DC
  Filled 2013-08-20: qty 2

## 2013-08-20 NOTE — ED Notes (Signed)
rn w/ permission spoke to pts son carlo on the telephone, gave update on the pts status.

## 2013-08-20 NOTE — ED Notes (Signed)
Dr.zammit to bedside. Plan of care discussed. Additional labs and meds ordered.

## 2013-08-20 NOTE — ED Notes (Signed)
Pt c/o chest pain x 2 days and pain in left arm.  Describes pain as burning.  Today c/o generalized weakness, SOB, and nausea.

## 2013-08-20 NOTE — ED Notes (Signed)
Pt assisted by family with contrast. C/o nausea, meds ordered and given. Family assisting pt with movement in bed. Continues to complain of burng to her stomach area.  md aware.

## 2013-08-20 NOTE — ED Provider Notes (Addendum)
CSN: [id number]     Arrival date & time 08/20/13  1102 History  This chart was scribed for Maudry Diego, MD by Roxan Diesel, ED scribe.  This patient was seen in room [facility]14/[facility]14 and the patient's care was started at 11:45 AM.   Chief Complaint  Patient presents with  . Chest Pain    Patient is a 67 y.o. female presenting with chest pain. The history is provided by the patient. No language interpreter was used.  Chest Pain Pain location:  L chest Pain quality: burning   Radiates to: Left shoulder blade. Pain radiates to the back: yes   Pain severity:  Severe Duration:  2 days Progression:  Worsening Chronicity:  New Relieved by:  Nothing Ineffective treatments:  Antacids Associated symptoms: nausea and weakness   Associated symptoms: no back pain, no cough, no fatigue and no headache   Risk factors: hypertension   Risk factors comment:  H/o diverticulitis   HPI Comments: Nicole [name] is a 67 y.o. female with h/o diverticulitis who presents to the Emergency Department complaining of 2 days of severe left-sided chest pain radiating to left shoulder blade.  Pain is described as "burning" beneath her left breast.  Today she also complains of generalized weakness and nausea.  She took Milk of Magnesia this morning, without relief.  She did not take any other medications pta.  Pt denies prior h/o same pain.  She has h/o cholecystectomy.  She denies any other abdominal surgeries.   Past Medical History  Diagnosis Date  . Hypertension   . Gastroesophageal reflux   . Spinal stenosis   . Carpal tunnel syndrome, bilateral   . AC (acromioclavicular) joint bone spurs     lt shoulder  . Arthritis     Past Surgical History  Procedure Laterality Date  . Cholecystectomy    . Colonoscopy  06/11/2012    Procedure: COLONOSCOPY;  Surgeon: Jamesetta So, MD;  Location: [facility];  Service: Gastroenterology;  Laterality: N/A;    No family history on file.   History   Substance Use Topics  . Smoking status: Never Smoker   . Smokeless tobacco: Not on file  . Alcohol Use: No    OB History   Grav Para Term Preterm Abortions TAB SAB Ect Mult Living                  Review of Systems  Constitutional: Negative for appetite change and fatigue.  HENT: Negative for congestion, ear discharge and sinus pressure.   Eyes: Negative for discharge.  Respiratory: Negative for cough.   Cardiovascular: Positive for chest pain.  Gastrointestinal: Positive for nausea. Negative for diarrhea.  Genitourinary: Negative for frequency and hematuria.  Musculoskeletal: Negative for back pain.  Skin: Negative for rash.  Neurological: Positive for weakness. Negative for seizures and headaches.  Psychiatric/Behavioral: Negative for hallucinations.     Allergies  Sulfa antibiotics and Penicillins  Home Medications   Current Outpatient Rx  Name  Route  Sig  Dispense  Refill  . ibuprofen (ADVIL,MOTRIN) 200 MG tablet   Oral   Take 400 mg by mouth every 6 (six) hours as needed. Headache pain         . meloxicam (MOBIC) 15 MG tablet   Oral   Take 15 mg by mouth 2 (two) times daily.         [name] omeprazole (PRILOSEC) 20 MG capsule   Oral   Take 20 mg by mouth daily.         [name]  OVER THE COUNTER MEDICATION   Oral   Take 1 tablet by mouth daily. Herbal Supplement to help with muscle pain.         [name] oxyCODONE-acetaminophen (PERCOCET) 10-325 MG per tablet   Oral   Take 1 tablet by mouth 3 (three) times daily as needed. Pain         . valsartan-hydrochlorothiazide (DIOVAN-HCT) 160-25 MG per tablet   Oral   Take 1 tablet by mouth daily.          BP 138/77  Pulse 79  Temp(Src) 98.4 F (36.9 C) (Oral)  Resp 18  Ht 5\' 1"  (1.549 m)  Wt 260 lb (117.935 kg)  BMI 49.15 kg/m2  SpO2 99%  Physical Exam  Nursing note and vitals reviewed. Constitutional: She is oriented to person, place, and time. She appears well-developed.  HENT:  Head: Normocephalic.   Eyes: Conjunctivae and EOM are normal. No scleral icterus.  Neck: Neck supple. No thyromegaly present.  Cardiovascular: Normal rate and regular rhythm.  Exam reveals no gallop and no friction rub.   No murmur heard. Pulmonary/Chest: No stridor. She has no wheezes. She has no rales. She exhibits no tenderness.  Abdominal: She exhibits no distension. There is tenderness (moderate) in the epigastric area and left upper quadrant. There is no rebound.  Musculoskeletal: Normal range of motion. She exhibits no edema.  Lymphadenopathy:    She has no cervical adenopathy.  Neurological: She is oriented to person, place, and time. She exhibits normal muscle tone. Coordination normal.  Skin: No rash noted. No erythema.  Psychiatric: She has a normal mood and affect. Her behavior is normal.    ED Course  Procedures (including critical care time)  DIAGNOSTIC STUDIES: Oxygen Saturation is 99% on room air, normal by my interpretation.    COORDINATION OF CARE: 11:49 AM-Discussed treatment plan which includes EKG, labs and imaging with pt at bedside and pt agreed to plan.    Labs Review Labs Reviewed  CBC WITH DIFFERENTIAL  BASIC METABOLIC PANEL  TROPONIN I   Imaging Review No results found.  EKG Interpretation     Ventricular Rate:  75 PR Interval:  166 QRS Duration: 96 QT Interval:  384 QTC Calculation: 428 R Axis:   -17 Text Interpretation:  Normal sinus rhythm Normal ECG When compared with ECG of 28-Oct-2008 10:33, No significant change was found            MDM  No diagnosis found. Gastritis,  tx with protonix.  Pt offered admission for vomiting and she wanted to go home and follow up with pcp   The chart was scribed for me under my direct supervision.  I personally performed the history, physical, and medical decision making and all procedures in the evaluation of this patient.Maudry Diego, MD 08/20/13 [place], MD 08/20/13 [phone]

## 2013-08-20 NOTE — ED Notes (Signed)
Pt to car in wheelchair, cont. To c/o nausea.  md aware, d.c instructions given to pt and daughter. Verbalized understanding of all. 3 scripts given

## 2013-08-22 DIAGNOSIS — Z6841 Body Mass Index (BMI) 40.0 and over, adult: Secondary | ICD-10-CM | POA: Diagnosis not present

## 2013-08-22 DIAGNOSIS — R071 Chest pain on breathing: Secondary | ICD-10-CM | POA: Diagnosis not present

## 2013-08-22 DIAGNOSIS — K219 Gastro-esophageal reflux disease without esophagitis: Secondary | ICD-10-CM | POA: Diagnosis not present

## 2013-08-22 DIAGNOSIS — B029 Zoster without complications: Secondary | ICD-10-CM | POA: Diagnosis not present

## 2013-08-29 DIAGNOSIS — B0229 Other postherpetic nervous system involvement: Secondary | ICD-10-CM | POA: Diagnosis not present

## 2013-08-29 DIAGNOSIS — Z6841 Body Mass Index (BMI) 40.0 and over, adult: Secondary | ICD-10-CM | POA: Diagnosis not present

## 2013-08-29 DIAGNOSIS — R5381 Other malaise: Secondary | ICD-10-CM | POA: Diagnosis not present

## 2013-08-29 DIAGNOSIS — E538 Deficiency of other specified B group vitamins: Secondary | ICD-10-CM | POA: Diagnosis not present

## 2013-09-10 ENCOUNTER — Encounter (INDEPENDENT_AMBULATORY_CARE_PROVIDER_SITE_OTHER): Payer: Self-pay | Admitting: *Deleted

## 2013-09-13 DIAGNOSIS — B029 Zoster without complications: Secondary | ICD-10-CM | POA: Diagnosis not present

## 2013-09-13 DIAGNOSIS — Z6841 Body Mass Index (BMI) 40.0 and over, adult: Secondary | ICD-10-CM | POA: Diagnosis not present

## 2013-09-13 DIAGNOSIS — B0229 Other postherpetic nervous system involvement: Secondary | ICD-10-CM | POA: Diagnosis not present

## 2013-09-29 ENCOUNTER — Ambulatory Visit (INDEPENDENT_AMBULATORY_CARE_PROVIDER_SITE_OTHER): Payer: Medicare Other | Admitting: Internal Medicine

## 2013-09-29 ENCOUNTER — Encounter (INDEPENDENT_AMBULATORY_CARE_PROVIDER_SITE_OTHER): Payer: Self-pay | Admitting: Internal Medicine

## 2013-09-29 VITALS — BP 108/82 | HR 76 | Temp 98.1°F | Ht 63.0 in | Wt 269.5 lb

## 2013-09-29 DIAGNOSIS — K219 Gastro-esophageal reflux disease without esophagitis: Secondary | ICD-10-CM

## 2013-09-29 DIAGNOSIS — M4804 Spinal stenosis, thoracic region: Secondary | ICD-10-CM | POA: Insufficient documentation

## 2013-09-29 DIAGNOSIS — B029 Zoster without complications: Secondary | ICD-10-CM | POA: Diagnosis not present

## 2013-09-29 NOTE — Patient Instructions (Signed)
Decrease Protonix to once a day, 34minutes before breakfast. OV in one month.

## 2013-09-29 NOTE — Progress Notes (Signed)
Subjective:     Patient ID: Nicole [name], female   DOB: 02/15/1946, 67 y.o.   MRN: [id number]  HPIReferred to our office by Edythe Clarity PA for uncontrolled GERD. She tells me about a month ago she had burning under her left arm and upper back. She was diagnosed with shingles. Per office notes on 08/22/2013 vesicles were noted to her back and chest.  She says she had burning in her chest. Her Protonix was increased to twice a day. She was started on Valtrex After about 2 weeks her symptoms subsided as did the burning to her chest and back.  She says she feels much better now. She denies acid reflux.  Her appetite has remained good. No weight loss. She usually has a BM about once a day. No melena or bright red rectal bleeding   Review of Systems Current Outpatient Prescriptions  Medication Sig Dispense Refill  . diclofenac (VOLTAREN) 75 MG EC tablet Take 75 mg by mouth 2 (two) times daily.      [name] gabapentin (NEURONTIN) 100 MG capsule Take 200 mg by mouth 3 (three) times daily.       . pantoprazole (PROTONIX) 40 MG tablet Take 40 mg by mouth 2 (two) times daily.      . traMADol (ULTRAM) 50 MG tablet Take 50 mg by mouth every 6 (six) hours as needed for pain.      . valsartan-hydrochlorothiazide (DIOVAN-HCT) 160-25 MG per tablet Take 1 tablet by mouth daily.      . Vitamin D, Ergocalciferol, (DRISDOL) 50000 UNITS CAPS capsule Take 50,000 Units by mouth every 7 (seven) days. Takes on Wednesday       No current facility-administered medications for this visit.   Past Medical History  Diagnosis Date  . Hypertension   . Gastroesophageal reflux   . Spinal stenosis   . Carpal tunnel syndrome, bilateral   . AC (acromioclavicular) joint bone spurs     lt shoulder  . Arthritis    Past Surgical History  Procedure Laterality Date  . Cholecystectomy    . Colonoscopy  06/11/2012    Procedure: COLONOSCOPY;  Surgeon: Jamesetta So, MD;  Location: [facility];  Service: Gastroenterology;  Laterality:  N/A;   Allergies  Allergen Reactions  . Sulfa Antibiotics Other (See Comments)    Sores  . Penicillins Rash        Objective:   Physical Exam  Filed Vitals:   09/29/13 1541  BP: 108/82  Pulse: 76  Temp: 98.1 F (36.7 C)  Height: 5\' 3"  (1.6 m)  Weight: 269 lb 8 oz (122.244 kg)   Alert and oriented. Skin warm and dry. Oral mucosa is moist.   . Sclera anicteric, conjunctivae is pink. Thyroid not enlarged. No cervical lymphadenopathy. Lungs clear. Heart regular rate and rhythm.  Abdomen is soft. Bowel sounds are positive. No hepatomegaly. No abdominal masses felt. No tenderness.  No edema to lower extremities.  Redness under left axilla. No rash noted to chest or back.      Assessment:    GERD. Controlled at this time. I suspect her symptoms were related to her shingles. She is much better at this time. She describes her symptoms as a burning sensation in her chest which has now resolved.    Plan:     Decrease Protonix to once a day 30 minutes before breakfast.  She will have OV in one month to be sure she is doing well.

## 2013-11-03 ENCOUNTER — Encounter (INDEPENDENT_AMBULATORY_CARE_PROVIDER_SITE_OTHER): Payer: Self-pay | Admitting: Internal Medicine

## 2013-11-03 ENCOUNTER — Ambulatory Visit (INDEPENDENT_AMBULATORY_CARE_PROVIDER_SITE_OTHER): Payer: Medicare Other | Admitting: Internal Medicine

## 2013-11-03 VITALS — BP 118/72 | HR 56 | Temp 97.7°F | Ht 62.0 in | Wt 269.1 lb

## 2013-11-03 DIAGNOSIS — K219 Gastro-esophageal reflux disease without esophagitis: Secondary | ICD-10-CM | POA: Diagnosis not present

## 2013-11-03 NOTE — Patient Instructions (Signed)
OV in 1 year. Continue the Protonix 

## 2013-11-03 NOTE — Progress Notes (Signed)
Subjective:     Patient ID: Nicole [name], female   DOB: 1946/05/12, 67 y.o.   MRN: [id number]  HPI Here today for f/u of her GERD. She was last seen in December for same. She was diagnosed with Shingles in November. She had burning under her left arm and upper back.  She had vesicles to her back and chest.  She had burning to her chest. She was started on Valtrex and after about 2 weeks her symptoms subsided, as did the burning in her chest and back.  She did not have acid reflux at her office visit in December. Appetite is good. No weight loss. No abdominal pain. Has a BM daily.  She does have difficulty walking due to arthritis.     Review of Systems see hpi Current Outpatient Prescriptions  Medication Sig Dispense Refill  . diclofenac (VOLTAREN) 75 MG EC tablet Take 75 mg by mouth 2 (two) times daily.      [name] gabapentin (NEURONTIN) 100 MG capsule Take 200 mg by mouth 3 (three) times daily.       . pantoprazole (PROTONIX) 40 MG tablet Take 40 mg by mouth 2 (two) times daily.      . traMADol (ULTRAM) 50 MG tablet Take 50 mg by mouth every 6 (six) hours as needed for pain.      . valsartan-hydrochlorothiazide (DIOVAN-HCT) 160-25 MG per tablet Take 1 tablet by mouth daily.      . Vitamin D, Ergocalciferol, (DRISDOL) 50000 UNITS CAPS capsule Take 50,000 Units by mouth every 7 (seven) days. Takes on Wednesday       No current facility-administered medications for this visit.   Past Medical History  Diagnosis Date  . Hypertension   . Gastroesophageal reflux   . Spinal stenosis   . Carpal tunnel syndrome, bilateral   . AC (acromioclavicular) joint bone spurs     lt shoulder  . Arthritis    Past Surgical History  Procedure Laterality Date  . Cholecystectomy    . Colonoscopy  06/11/2012    Procedure: COLONOSCOPY;  Surgeon: Jamesetta So, MD;  Location: [facility];  Service: Gastroenterology;  Laterality: N/A;   Allergies  Allergen Reactions  . Sulfa Antibiotics Other (See  Comments)    Sores  . Penicillins Rash        Objective:   Physical Exam  Filed Vitals:   11/03/13 1522  BP: 118/72  Pulse: 56  Temp: 97.7 F (36.5 C)  Height: 5\' 2"  (1.575 m)  Weight: 269 lb 1.6 oz (122.063 kg)  Alert and oriented. Skin warm and dry. Oral mucosa is moist.   . Sclera anicteric, conjunctivae is pink. Thyroid not enlarged. No cervical lymphadenopathy. Lungs clear. Heart regular rate and rhythm.  Abdomen is soft. Bowel sounds are positive. No hepatomegaly. No abdominal masses felt. No tenderness.  No edema to lower extremities.       Assessment:    GERD controlled at this time. Recent hx of shingles which I think was the cause of her pain.     Plan:    Continue present medications. OV in one year..  Protonix once a day 30 minutes before breakfast.

## 2013-12-31 ENCOUNTER — Encounter (HOSPITAL_COMMUNITY): Payer: Self-pay | Admitting: Emergency Medicine

## 2013-12-31 ENCOUNTER — Emergency Department (HOSPITAL_COMMUNITY): Payer: Medicare Other

## 2013-12-31 ENCOUNTER — Emergency Department (HOSPITAL_COMMUNITY)
Admission: EM | Admit: 2013-12-31 | Discharge: 2013-12-31 | Disposition: A | Discharge status: Home / Self Care | Payer: Medicare Other | Attending: Emergency Medicine | Admitting: Emergency Medicine

## 2013-12-31 DIAGNOSIS — Z8619 Personal history of other infectious and parasitic diseases: Secondary | ICD-10-CM | POA: Insufficient documentation

## 2013-12-31 DIAGNOSIS — R61 Generalized hyperhidrosis: Secondary | ICD-10-CM | POA: Diagnosis not present

## 2013-12-31 DIAGNOSIS — K219 Gastro-esophageal reflux disease without esophagitis: Secondary | ICD-10-CM | POA: Diagnosis not present

## 2013-12-31 DIAGNOSIS — M1990 Unspecified osteoarthritis, unspecified site: Secondary | ICD-10-CM

## 2013-12-31 DIAGNOSIS — Z880 Allergy status to penicillin: Secondary | ICD-10-CM | POA: Diagnosis not present

## 2013-12-31 DIAGNOSIS — I10 Essential (primary) hypertension: Secondary | ICD-10-CM | POA: Diagnosis not present

## 2013-12-31 DIAGNOSIS — Z791 Long term (current) use of non-steroidal anti-inflammatories (NSAID): Secondary | ICD-10-CM | POA: Insufficient documentation

## 2013-12-31 DIAGNOSIS — R55 Syncope and collapse: Secondary | ICD-10-CM | POA: Insufficient documentation

## 2013-12-31 DIAGNOSIS — Z8669 Personal history of other diseases of the nervous system and sense organs: Secondary | ICD-10-CM | POA: Insufficient documentation

## 2013-12-31 DIAGNOSIS — R5381 Other malaise: Secondary | ICD-10-CM | POA: Diagnosis not present

## 2013-12-31 DIAGNOSIS — Z79899 Other long term (current) drug therapy: Secondary | ICD-10-CM | POA: Diagnosis not present

## 2013-12-31 DIAGNOSIS — Z8739 Personal history of other diseases of the musculoskeletal system and connective tissue: Secondary | ICD-10-CM | POA: Diagnosis not present

## 2013-12-31 DIAGNOSIS — M129 Arthropathy, unspecified: Secondary | ICD-10-CM | POA: Insufficient documentation

## 2013-12-31 DIAGNOSIS — R112 Nausea with vomiting, unspecified: Secondary | ICD-10-CM | POA: Diagnosis not present

## 2013-12-31 DIAGNOSIS — R5383 Other fatigue: Secondary | ICD-10-CM

## 2013-12-31 DIAGNOSIS — R42 Dizziness and giddiness: Secondary | ICD-10-CM | POA: Diagnosis not present

## 2013-12-31 DIAGNOSIS — R51 Headache: Secondary | ICD-10-CM | POA: Diagnosis not present

## 2013-12-31 LAB — CBC WITH DIFFERENTIAL/PLATELET
Basophils Absolute: 0 10*3/uL (ref 0.0–0.1)
Basophils Relative: 0 % (ref 0–1)
Eosinophils Absolute: 0.1 10*3/uL (ref 0.0–0.7)
Eosinophils Relative: 2 % (ref 0–5)
HCT: 36.3 % (ref 36.0–46.0)
Hemoglobin: 12.3 g/dL (ref 12.0–15.0)
Lymphocytes Relative: 20 % (ref 12–46)
Lymphs Abs: 1.5 10*3/uL (ref 0.7–4.0)
MCH: 29.9 pg (ref 26.0–34.0)
MCHC: 33.9 g/dL (ref 30.0–36.0)
MCV: 88.3 fL (ref 78.0–100.0)
Monocytes Absolute: 0.6 10*3/uL (ref 0.1–1.0)
Monocytes Relative: 8 % (ref 3–12)
Neutro Abs: 5.1 10*3/uL (ref 1.7–7.7)
Neutrophils Relative %: 70 % (ref 43–77)
Platelets: 330 10*3/uL (ref 150–400)
RBC: 4.11 MIL/uL (ref 3.87–5.11)
RDW: 13.8 % (ref 11.5–15.5)
WBC: 7.3 10*3/uL (ref 4.0–10.5)

## 2013-12-31 LAB — BASIC METABOLIC PANEL
BUN: 19 mg/dL (ref 6–23)
CO2: 27 mEq/L (ref 19–32)
Calcium: 9.8 mg/dL (ref 8.4–10.5)
Chloride: 100 mEq/L (ref 96–112)
Creatinine, Ser: 1.09 mg/dL (ref 0.50–1.10)
GFR calc Af Amer: 59 mL/min — ABNORMAL LOW (ref >90)
GFR calc non Af Amer: 51 mL/min — ABNORMAL LOW (ref >90)
Glucose, Bld: 109 mg/dL — ABNORMAL HIGH (ref 70–99)
Potassium: 4.3 mEq/L (ref 3.7–5.3)
Sodium: 140 mEq/L (ref 137–147)

## 2013-12-31 MED ORDER — ONDANSETRON HCL 4 MG/2ML IJ SOLN
4.0000 mg | Freq: Once | INTRAMUSCULAR | Status: AC
  Administered 2013-12-31: 4 mg via INTRAVENOUS
  Filled 2013-12-31: qty 2

## 2013-12-31 MED ORDER — SODIUM CHLORIDE 0.9 % IV SOLN: INTRAVENOUS | Status: DC

## 2013-12-31 MED ORDER — HYDROMORPHONE HCL PF 1 MG/ML IJ SOLN
1.0000 mg | Freq: Once | INTRAMUSCULAR | Status: AC
  Administered 2013-12-31: 1 mg via INTRAVENOUS
  Filled 2013-12-31: qty 1

## 2013-12-31 NOTE — ED Provider Notes (Signed)
CSN: [id number]     Arrival date & time 12/31/13  1034 History  This chart was scribed for Mervin Kung, MD by Ludger Nutting, ED Scribe. This patient was seen in room APA01/APA01 and the patient's care was started 11:55 AM.    Chief Complaint  Patient presents with  . Weakness  . Dizziness      The history is provided by the patient. No language interpreter was used.    HPI Comments: Nicole [name] is a 68 y.o. female who presents to the Emergency Department complaining of an episode of lightheadedness with associated diaphoresis and nausea that occurred while at the doctor's office this morning. Pt states she has a history of arthritis and went to see her PCP because it has been flaring up. She was leaving the doctor's office when she became unsteady on her feet and stumbled backwards. She reports having associated vomiting as well. She reports having intermittent HA which she describes as heaviness since arriving to the ED. She reports taking a hydrocodone this morning for arthritis pain but states it is not helping. She denies chest pain, abdominal pain.   PCP Dr. Orson Ape   Past Medical History  Diagnosis Date  . Hypertension   . Gastroesophageal reflux   . Spinal stenosis   . Carpal tunnel syndrome, bilateral   . AC (acromioclavicular) joint bone spurs     lt shoulder  . Arthritis   . Shingles    Past Surgical History  Procedure Laterality Date  . Cholecystectomy    . Colonoscopy  06/11/2012    Procedure: COLONOSCOPY;  Surgeon: Jamesetta So, MD;  Location: [facility];  Service: Gastroenterology;  Laterality: N/A;   History reviewed. No pertinent family history. History  Substance Use Topics  . Smoking status: Never Smoker   . Smokeless tobacco: Not on file  . Alcohol Use: No   OB History   Grav Para Term Preterm Abortions TAB SAB Ect Mult Living                 Review of Systems  Constitutional: Positive for diaphoresis. Negative for fever and chills.   HENT: Negative for rhinorrhea and sore throat.   Eyes: Negative for visual disturbance.  Respiratory: Negative for cough and shortness of breath.   Cardiovascular: Negative for chest pain and leg swelling.  Gastrointestinal: Positive for nausea and vomiting. Negative for abdominal pain and diarrhea.  Genitourinary: Negative for dysuria.  Musculoskeletal: Positive for arthralgias. Negative for back pain and neck pain.  Skin: Negative for rash.  Neurological: Positive for light-headedness and headaches.  Hematological: Does not bruise/bleed easily.  Psychiatric/Behavioral: Negative for confusion.      Allergies  Sulfa antibiotics and Penicillins  Home Medications   Current Outpatient Rx  Name  Route  Sig  Dispense  Refill  . diclofenac (VOLTAREN) 75 MG EC tablet   Oral   Take 75 mg by mouth 2 (two) times daily.         [name] gabapentin (NEURONTIN) 100 MG capsule   Oral   Take 200 mg by mouth 3 (three) times daily.          [name] HYDROcodone-acetaminophen (NORCO/VICODIN) 5-325 MG per tablet   Oral   Take 1 tablet by mouth every 6 (six) hours as needed for moderate pain.         [name] OVER THE COUNTER MEDICATION   Oral   Take 1 capsule by mouth daily. Capsule to help with muscle soreness.         [name]  pantoprazole (PROTONIX) 40 MG tablet   Oral   Take 40 mg by mouth daily.          . traMADol (ULTRAM) 50 MG tablet   Oral   Take 50 mg by mouth every 6 (six) hours as needed for pain.         . valsartan-hydrochlorothiazide (DIOVAN-HCT) 160-25 MG per tablet   Oral   Take 1 tablet by mouth daily.         . Vitamin D, Ergocalciferol, (DRISDOL) 50000 UNITS CAPS capsule   Oral   Take 50,000 Units by mouth every 7 (seven) days. Takes on Wednesday          BP 111/70  Pulse 68  Temp(Src) 98.1 F (36.7 C) (Oral)  Resp 20  Ht 5\' 2"  (1.575 m)  Wt 260 lb (117.935 kg)  BMI 47.54 kg/m2  SpO2 98% Physical Exam  Nursing note and vitals reviewed. Constitutional: She is  oriented to person, place, and time. She appears well-developed and well-nourished.  HENT:  Head: Normocephalic and atraumatic.  Cardiovascular: Normal rate, regular rhythm and normal heart sounds.   Pulmonary/Chest: Effort normal and breath sounds normal. No respiratory distress. She has no wheezes. She has no rales. She exhibits no tenderness.  Abdominal: Soft. Bowel sounds are normal. She exhibits no distension and no mass. There is no tenderness. There is no rebound and no guarding.  Musculoskeletal: She exhibits no edema.  No swelling or redness to the bilateral knees.   Neurological: She is alert and oriented to person, place, and time.  Skin: Skin is warm and dry.  Psychiatric: She has a normal mood and affect.    ED Course  Procedures (including critical care time)  DIAGNOSTIC STUDIES: Oxygen Saturation is 92% on RA, low by my interpretation.    COORDINATION OF CARE: 12:04 PM Discussed treatment plan with pt at bedside and pt agreed to plan.   Labs Review Labs Reviewed  BASIC METABOLIC PANEL - Abnormal; Notable for the following:    Glucose, Bld 109 (*)    GFR calc non Af Amer 51 (*)    GFR calc Af Amer 59 (*)    All other components within normal limits  CBC WITH DIFFERENTIAL   Results for orders placed during the hospital encounter of [id number]  BASIC METABOLIC PANEL      Result Value Ref Range   Sodium 140  137 - 147 mEq/L   Potassium 4.3  3.7 - 5.3 mEq/L   Chloride 100  96 - 112 mEq/L   CO2 27  19 - 32 mEq/L   Glucose, Bld 109 (*) 70 - 99 mg/dL   BUN 19  6 - 23 mg/dL   Creatinine, Ser 1.09  0.50 - 1.10 mg/dL   Calcium 9.8  8.4 - 10.5 mg/dL   GFR calc non Af Amer 51 (*) >90 mL/min   GFR calc Af Amer 59 (*) >90 mL/min  CBC WITH DIFFERENTIAL      Result Value Ref Range   WBC 7.3  4.0 - 10.5 K/uL   RBC 4.11  3.87 - 5.11 MIL/uL   Hemoglobin 12.3  12.0 - 15.0 g/dL   HCT 36.3  36.0 - 46.0 %   MCV 88.3  78.0 - 100.0 fL   MCH 29.9  26.0 - 34.0 pg   MCHC 33.9   30.0 - 36.0 g/dL   RDW 13.8  11.5 - 15.5 %   Platelets 330  150 - 400 K/uL  Neutrophils Relative % 70  43 - 77 %   Neutro Abs 5.1  1.7 - 7.7 K/uL   Lymphocytes Relative 20  12 - 46 %   Lymphs Abs 1.5  0.7 - 4.0 K/uL   Monocytes Relative 8  3 - 12 %   Monocytes Absolute 0.6  0.1 - 1.0 K/uL   Eosinophils Relative 2  0 - 5 %   Eosinophils Absolute 0.1  0.0 - 0.7 K/uL   Basophils Relative 0  0 - 1 %   Basophils Absolute 0.0  0.0 - 0.1 K/uL    Imaging Review Dg Chest 2 View  12/31/2013   CLINICAL DATA Weakness and dizziness  EXAM CHEST  2 VIEW  COMPARISON 08/20/2013  FINDINGS Cardiac enlargement without heart failure or pneumonia. Lungs are clear.  IMPRESSION No active cardiopulmonary disease.  SIGNATURE  Electronically Signed   By: Franchot Gallo M.D.   On: 12/31/2013 13:05   Ct Head Wo Contrast  12/31/2013   CLINICAL DATA Dizziness and headache  EXAM CT HEAD WITHOUT CONTRAST  TECHNIQUE Contiguous axial images were obtained from the base of the skull through the vertex without intravenous contrast. Study was obtained within 24 hr of patient's arrival at the emergency department  COMPARISON May 10, 2009  FINDINGS Ventricles are normal in size and configuration. There is no mass, hemorrhage, extra-axial fluid collection, or midline shift. The gray-white compartments are normal. There is no demonstrable acute infarct. Bony calvarium appears intact. The mastoid air cells are clear.  IMPRESSION Study within normal limits.  SIGNATURE  Electronically Signed   By: Lowella Grip M.D.   On: 12/31/2013 13:12     EKG Interpretation   Date/Time:  Wednesday December 31 2013 11:04:09 EDT Ventricular Rate:  73 PR Interval:  176 QRS Duration: 104 QT Interval:  396 QTC Calculation: 436 R Axis:   -19 Text Interpretation:  Normal sinus rhythm Normal ECG When compared with  ECG of 20-Aug-2013 11:12, No significant change was found Confirmed by  Alara Daniel  MD, Valynn Schamberger [phone]) on 12/31/2013 12:36:27 PM       MDM   Final diagnoses:  Arthritis  Near syncope    Workup here in the emergency part without significant findings. We'll give you a trial walking around to make sure you do okay. For your arthritis problems recommend tramadol around-the-clock and then supplement with hydrocodone as needed for additional pain. Patient regarding the near syncope episodes head CT was negative EKG without acute changes no significant anemia and no significant mitral abnormalities.  I personally performed the services described in this documentation, which was scribed in my presence. The recorded information has been reviewed and is accurate.    Mervin Kung, MD 12/31/13 [phone]

## 2013-12-31 NOTE — Discharge Instructions (Signed)
Take your pain medications as directed. Recommend trying tramadol around-the-clock and supplementing with hydrocodone if tramadol not helping. Contact her primary care doctor's office to see what the regimen. Which one perhaps it was a prednisone regimen and so they can call that in to the pharmacy for you. Return for any newer worse symptoms.

## 2013-12-31 NOTE — ED Notes (Signed)
Pt friend driving pt home.

## 2013-12-31 NOTE — ED Notes (Addendum)
Pt reports generalized weakness that started yesterday. Pt reports dizziness,n/v, started x1 hour ago. Pt reports "felt like I was falling backwards at the doctor's office this am." Pt alert and oriented. Airway patent. No facial asymmetry noted.Pt denies one sided weakness, numbness/tingling in extremities.

## 2014-01-14 DIAGNOSIS — Z6829 Body mass index (BMI) 29.0-29.9, adult: Secondary | ICD-10-CM | POA: Diagnosis not present

## 2014-01-14 DIAGNOSIS — I10 Essential (primary) hypertension: Secondary | ICD-10-CM | POA: Diagnosis not present

## 2014-01-14 DIAGNOSIS — M67919 Unspecified disorder of synovium and tendon, unspecified shoulder: Secondary | ICD-10-CM | POA: Diagnosis not present

## 2014-01-14 DIAGNOSIS — M719 Bursopathy, unspecified: Secondary | ICD-10-CM | POA: Diagnosis not present

## 2014-01-30 ENCOUNTER — Other Ambulatory Visit: Payer: Self-pay | Admitting: Obstetrics & Gynecology

## 2014-01-30 ENCOUNTER — Ambulatory Visit (INDEPENDENT_AMBULATORY_CARE_PROVIDER_SITE_OTHER): Payer: Medicare Other | Admitting: Obstetrics & Gynecology

## 2014-01-30 ENCOUNTER — Ambulatory Visit (INDEPENDENT_AMBULATORY_CARE_PROVIDER_SITE_OTHER): Payer: Medicare Other

## 2014-01-30 ENCOUNTER — Encounter: Payer: Self-pay | Admitting: Obstetrics & Gynecology

## 2014-01-30 VITALS — BP 120/80 | Ht 60.0 in | Wt 260.0 lb

## 2014-01-30 DIAGNOSIS — N950 Postmenopausal bleeding: Secondary | ICD-10-CM

## 2014-01-30 DIAGNOSIS — N840 Polyp of corpus uteri: Secondary | ICD-10-CM | POA: Diagnosis not present

## 2014-01-30 NOTE — Progress Notes (Signed)
Patient ID: Nicole [name], female   DOB: [birth year], 68 y.o.   MRN: [id number] No obstetric history on file. Pt has history of 1 week of PMB, mostly spotting til today when it became heavy  Sonogram reveals a thickened heterogenous endometrium, small calcified fibroids ?polyp in endocervix  EMB done  Follow up 1 week for results  Past Medical History  Diagnosis Date  . Hypertension   . Gastroesophageal reflux   . Spinal stenosis   . Carpal tunnel syndrome, bilateral   . AC (acromioclavicular) joint bone spurs     lt shoulder  . Arthritis   . Shingles     Past Surgical History  Procedure Laterality Date  . Cholecystectomy    . Colonoscopy  06/11/2012    Procedure: COLONOSCOPY;  Surgeon: Jamesetta So, MD;  Location: [facility];  Service: Gastroenterology;  Laterality: N/A;    OB History   Grav Para Term Preterm Abortions TAB SAB Ect Mult Living                  Allergies  Allergen Reactions  . Sulfa Antibiotics Other (See Comments)    Sores  . Penicillins Rash    History   Social History  . Marital Status: Widowed    Spouse Name: N/A    Number of Children: N/A  . Years of Education: N/A   Social History Main Topics  . Smoking status: Never Smoker   . Smokeless tobacco: Not on file  . Alcohol Use: No  . Drug Use: No  . Sexual Activity: Not on file   Other Topics Concern  . Not on file   Social History Narrative  . No narrative on file    No family history on file.

## 2014-02-02 ENCOUNTER — Telehealth: Payer: Self-pay | Admitting: Obstetrics & Gynecology

## 2014-02-02 NOTE — Telephone Encounter (Signed)
Pt informed of Dr. Elonda Husky recommendation increased bleeding expected to keep her appt for this Friday, April 17,2015. Pt verbalized understanding.

## 2014-02-02 NOTE — Telephone Encounter (Signed)
Pt states vaginal bleeding has increased since Friday, what does she need to do?

## 2014-02-02 NOTE — Telephone Encounter (Signed)
It is expected just keep appointment

## 2014-02-06 ENCOUNTER — Ambulatory Visit (INDEPENDENT_AMBULATORY_CARE_PROVIDER_SITE_OTHER): Payer: Medicare Other | Admitting: Obstetrics & Gynecology

## 2014-02-06 ENCOUNTER — Encounter: Payer: Self-pay | Admitting: Obstetrics & Gynecology

## 2014-02-06 VITALS — BP 120/72 | Ht 61.0 in | Wt 262.0 lb

## 2014-02-06 DIAGNOSIS — N840 Polyp of corpus uteri: Secondary | ICD-10-CM | POA: Diagnosis not present

## 2014-02-06 DIAGNOSIS — Z01818 Encounter for other preprocedural examination: Secondary | ICD-10-CM | POA: Diagnosis not present

## 2014-02-06 DIAGNOSIS — N950 Postmenopausal bleeding: Secondary | ICD-10-CM

## 2014-02-06 NOTE — Progress Notes (Signed)
Patient ID: Nicole [name], female   DOB: 1945-12-17, 68 y.o.   MRN: [id number] Pt came in last week for post menopausal bleeding last week  Biopsy reveal endometrial polyps  Will need hystersocopy and removal of polyps Scheduled for 02/25/2014  Past Medical History  Diagnosis Date  . Hypertension   . Gastroesophageal reflux   . Spinal stenosis   . Carpal tunnel syndrome, bilateral   . AC (acromioclavicular) joint bone spurs     lt shoulder  . Arthritis   . Shingles     Past Surgical History  Procedure Laterality Date  . Cholecystectomy    . Colonoscopy  06/11/2012    Procedure: COLONOSCOPY;  Surgeon: Jamesetta So, MD;  Location: [facility];  Service: Gastroenterology;  Laterality: N/A;    OB History   Grav Para Term Preterm Abortions TAB SAB Ect Mult Living                  Allergies  Allergen Reactions  . Sulfa Antibiotics Other (See Comments)    Sores  . Penicillins Rash    History   Social History  . Marital Status: Widowed    Spouse Name: N/A    Number of Children: N/A  . Years of Education: N/A   Social History Main Topics  . Smoking status: Never Smoker   . Smokeless tobacco: Never Used  . Alcohol Use: No  . Drug Use: No  . Sexual Activity: None   Other Topics Concern  . None   Social History Narrative  . None    History reviewed. No pertinent family history.

## 2014-02-09 ENCOUNTER — Encounter (HOSPITAL_COMMUNITY): Payer: Self-pay | Admitting: Pharmacy Technician

## 2014-02-20 ENCOUNTER — Encounter (HOSPITAL_COMMUNITY): Payer: Self-pay

## 2014-02-20 ENCOUNTER — Encounter (HOSPITAL_COMMUNITY)
Admission: RE | Admit: 2014-02-20 | Discharge: 2014-02-20 | Disposition: A | Discharge status: Home / Self Care | Payer: Medicare Other | Source: Ambulatory Visit | Attending: Obstetrics & Gynecology | Admitting: Obstetrics & Gynecology

## 2014-02-20 DIAGNOSIS — Z01812 Encounter for preprocedural laboratory examination: Secondary | ICD-10-CM | POA: Diagnosis not present

## 2014-02-20 LAB — CBC
HCT: 36 % (ref 36.0–46.0)
Hemoglobin: 11.9 g/dL — ABNORMAL LOW (ref 12.0–15.0)
MCH: 29.3 pg (ref 26.0–34.0)
MCHC: 33.1 g/dL (ref 30.0–36.0)
MCV: 88.7 fL (ref 78.0–100.0)
Platelets: 343 10*3/uL (ref 150–400)
RBC: 4.06 MIL/uL (ref 3.87–5.11)
RDW: 14.4 % (ref 11.5–15.5)
WBC: 8.3 10*3/uL (ref 4.0–10.5)

## 2014-02-20 LAB — COMPREHENSIVE METABOLIC PANEL
ALT: 25 U/L (ref 0–35)
AST: 19 U/L (ref 0–37)
Albumin: 3.3 g/dL — ABNORMAL LOW (ref 3.5–5.2)
Alkaline Phosphatase: 130 U/L — ABNORMAL HIGH (ref 39–117)
BUN: 18 mg/dL (ref 6–23)
CO2: 29 mEq/L (ref 19–32)
Calcium: 9.7 mg/dL (ref 8.4–10.5)
Chloride: 101 mEq/L (ref 96–112)
Creatinine, Ser: 1.18 mg/dL — ABNORMAL HIGH (ref 0.50–1.10)
GFR calc Af Amer: 54 mL/min — ABNORMAL LOW (ref >90)
GFR calc non Af Amer: 47 mL/min — ABNORMAL LOW (ref >90)
Glucose, Bld: 76 mg/dL (ref 70–99)
Potassium: 4.3 mEq/L (ref 3.7–5.3)
Sodium: 141 mEq/L (ref 137–147)
Total Bilirubin: 0.3 mg/dL (ref 0.3–1.2)
Total Protein: 6.9 g/dL (ref 6.0–8.3)

## 2014-02-20 LAB — URINALYSIS, ROUTINE W REFLEX MICROSCOPIC
Bilirubin Urine: NEGATIVE
Glucose, UA: NEGATIVE mg/dL
Hgb urine dipstick: NEGATIVE
Ketones, ur: NEGATIVE mg/dL
Leukocytes, UA: NEGATIVE
Nitrite: NEGATIVE
Protein, ur: NEGATIVE mg/dL
Specific Gravity, Urine: 1.015 (ref 1.005–1.030)
Urobilinogen, UA: 0.2 mg/dL (ref 0.0–1.0)
pH: 5.5 (ref 5.0–8.0)

## 2014-02-20 NOTE — Patient Instructions (Signed)
KERRIAN PANIK  02/20/2014   Your procedure is scheduled on:   02/25/2014  Report to [facility] at  27  AM.  Call this number if you have problems the morning of surgery: [phone]   Remember:   Do not eat food or drink liquids after midnight.   Take these medicines the morning of surgery with A SIP OF WATER: voltaren, neurontin, hydrocodone, protonix, ultram, valsartan   Do not wear jewelry, make-up or nail polish.  Do not wear lotions, powders, or perfumes.   Do not shave 48 hours prior to surgery. Men may shave face and neck.  Do not bring valuables to the hospital.  [facility] is not responsible for any belongings or valuables.               Contacts, dentures or bridgework may not be worn into surgery.  Leave suitcase in the car. After surgery it may be brought to your room.  For patients admitted to the hospital, discharge time is determined by your treatment team.               Patients discharged the day of surgery will not be allowed to drive home.  Name and phone number of your driver: family  Special Instructions: Shower using CHG 2 nights before surgery and the night before surgery.  If you shower the day of surgery use CHG.  Use special wash - you have one bottle of CHG for all showers.  You should use approximately 1/3 of the bottle for each shower.   Please read over the following fact sheets that you were given: Pain Booklet, Coughing and Deep Breathing, Surgical Site Infection Prevention, Anesthesia Post-op Instructions and Care and Recovery After Surgery Dysfunctional Uterine Bleeding Normally, menstrual periods begin between ages 23 to 52 in young women. A normal menstrual cycle/period may begin every 23 days up to 35 days and lasts from 1 to 7 days. Around 12 to 14 days before your menstrual period starts, ovulation (ovary produces an egg) occurs. When counting the time between menstrual periods, count from the first day of bleeding of the previous period  to the first day of bleeding of the next period. Dysfunctional (abnormal) uterine bleeding is bleeding that is different from a normal menstrual period. Your periods may come earlier or later than usual. They may be lighter, have blood clots or be heavier. You may have bleeding between periods, or you may skip one period or more. You may have bleeding after sexual intercourse, bleeding after menopause, or no menstrual period. CAUSES   Pregnancy (normal, miscarriage, tubal).  IUDs (intrauterine device, birth control).  Birth control pills.  Hormone treatment.  Menopause.  Infection of the cervix.  Blood clotting problems.  Infection of the inside lining of the uterus.  Endometriosis, inside lining of the uterus growing in the pelvis and other female organs.  Adhesions (scar tissue) inside the uterus.  Obesity or severe weight loss.  Uterine polyps inside the uterus.  Cancer of the vagina, cervix, or uterus.  Ovarian cysts or polycystic ovary syndrome.  Medical problems (diabetes, thyroid disease).  Uterine fibroids (noncancerous tumor).  Problems with your female hormones.  Endometrial hyperplasia, very thick lining and enlarged cells inside of the uterus.  Medicines that interfere with ovulation.  Radiation to the pelvis or abdomen.  Chemotherapy. DIAGNOSIS   Your doctor will discuss the history of your menstrual periods, medicines you are taking, changes in your weight,  stress in your life, and any medical problems you may have.  Your doctor will do a physical and pelvic examination.  Your doctor may want to perform certain tests to make a diagnosis, such as:  Pap test.  Blood tests.  Cultures for infection.  CT scan.  Ultrasound.  Hysteroscopy.  Laparoscopy.  MRI.  Hysterosalpingography.  D and C.  Endometrial biopsy. TREATMENT  Treatment will depend on the cause of the dysfunctional uterine bleeding (DUB). Treatment may  include:  Observing your menstrual periods for a couple of months.  Prescribing medicines for medical problems, including:  Antibiotics.  Hormones.  Birth control pills.  Removing an IUD (intrauterine device, birth control).  Surgery:  D and C (scrape and remove tissue from inside the uterus).  Laparoscopy (examine inside the abdomen with a lighted tube).  Uterine ablation (destroy lining of the uterus with electrical current, laser, heat, or freezing).  Hysteroscopy (examine cervix and uterus with a lighted tube).  Hysterectomy (remove the uterus). HOME CARE INSTRUCTIONS   If medicines were prescribed, take exactly as directed. Do not change or switch medicines without consulting your caregiver.  Long term heavy bleeding may result in iron deficiency. Your caregiver may have prescribed iron pills. They help replace the iron that your body lost from heavy bleeding. Take exactly as directed.  Do not take aspirin or medicines that contain aspirin one week before or during your menstrual period. Aspirin may make the bleeding worse.  If you need to change your sanitary pad or tampon more than once every 2 hours, stay in bed with your feet elevated and a cold pack on your lower abdomen. Rest as much as possible, until the bleeding stops or slows down.  Eat well-balanced meals. Eat foods high in iron. Examples are:  Leafy green vegetables.  Whole-grain breads and cereals.  Eggs.  Meat.  Liver.  Do not try to lose weight until the abnormal bleeding has stopped and your blood iron level is back to normal. Do not lift more than ten pounds or do strenuous activities when you are bleeding.  For a couple of months, make note on your calendar, marking the start and ending of your period, and the type of bleeding (light, medium, heavy, spotting, clots or missed periods). This is for your caregiver to better evaluate your problem. SEEK MEDICAL CARE IF:   You develop nausea  (feeling sick to your stomach) and vomiting, dizziness, or diarrhea while you are taking your medicine.  You are getting lightheaded or weak.  You have any problems that may be related to the medicine you are taking.  You develop pain with your DUB.  You want to remove your IUD.  You want to stop or change your birth control pills or hormones.  You have any type of abnormal bleeding mentioned above.  You are over 14 years old and have not had a menstrual period yet.  You are 68 years old and you are still having menstrual periods.  You have any of the symptoms mentioned above.  You develop a rash. SEEK IMMEDIATE MEDICAL CARE IF:   An oral temperature above 102 F (38.9 C) develops.  You develop chills.  You are changing your sanitary pad or tampon more than once an hour.  You develop abdominal pain.  You pass out or faint. Document Released: 10/06/2000 Document Revised: 01/01/2012 Document Reviewed: 09/07/2009 [facility] Patient Information 2014 [place]. Hysteroscopy Hysteroscopy is a procedure used for looking inside the womb (uterus). It  may be done for various reasons, including:  To evaluate abnormal bleeding, fibroid (benign, noncancerous) tumors, polyps, scar tissue (adhesions), and possibly cancer of the uterus.  To look for lumps (tumors) and other uterine growths.  To look for causes of why a woman cannot get pregnant (infertility), causes of recurrent loss of pregnancy (miscarriages), or a lost intrauterine device (IUD).  To perform a sterilization by blocking the fallopian tubes from inside the uterus. In this procedure, a thin, flexible tube with a tiny light and camera on the end of it (hysteroscope) is used to look inside the uterus. A hysteroscopy should be done right after a menstrual period to be sure you are not pregnant. LET [facility] CARE PROVIDER KNOW ABOUT:   Any allergies you have.  All medicines you are taking, including vitamins,  herbs, eye drops, creams, and over-the-counter medicines.  Previous problems you or members of your family have had with the use of anesthetics.  Any blood disorders you have.  Previous surgeries you have had.  Medical conditions you have. RISKS AND COMPLICATIONS  Generally, this is a safe procedure. However, as with any procedure, complications can occur. Possible complications include:  Putting a hole in the uterus.  Excessive bleeding.  Infection.  Damage to the cervix.  Injury to other organs.  Allergic reaction to medicines.  Too much fluid used in the uterus for the procedure. BEFORE THE PROCEDURE   Ask your health care provider about changing or stopping any regular medicines.  Do not take aspirin or blood thinners for 1 week before the procedure, or as directed by your health care provider. These can cause bleeding.  If you smoke, do not smoke for 2 weeks before the procedure.  In some cases, a medicine is placed in the cervix the day before the procedure. This medicine makes the cervix have a larger opening (dilate). This makes it easier for the instrument to be inserted into the uterus during the procedure.  Do not eat or drink anything for at least 8 hours before the surgery.  Arrange for someone to take you home after the procedure. PROCEDURE   You may be given a medicine to relax you (sedative). You may also be given one of the following:  A medicine that numbs the area around the cervix (local anesthetic).  A medicine that makes you sleep through the procedure (general anesthetic).  The hysteroscope is inserted through the vagina into the uterus. The camera on the hysteroscope sends a picture to a TV screen. This gives the surgeon a good view inside the uterus.  During the procedure, air or a liquid is put into the uterus, which allows the surgeon to see better.  Sometimes, tissue is gently scraped from inside the uterus. These tissue samples are sent  to a lab for testing. AFTER THE PROCEDURE   If you had a general anesthetic, you may be groggy for a couple hours after the procedure.  If you had a local anesthetic, you will be able to go home as soon as you are stable and feel ready.  You may have some cramping. This normally lasts for a couple days.  You may have bleeding, which varies from light spotting for a few days to menstrual-like bleeding for 3 7 days. This is normal.  If your test results are not back during the visit, make an appointment with your health care provider to find out the results. Document Released: 01/15/2001 Document Revised: 07/30/2013 Document Reviewed: 05/08/2013 [employer]  Patient Information 2014 [place]. PATIENT INSTRUCTIONS POST-ANESTHESIA  IMMEDIATELY FOLLOWING SURGERY:  Do not drive or operate machinery for the first twenty four hours after surgery.  Do not make any important decisions for twenty four hours after surgery or while taking narcotic pain medications or sedatives.  If you develop intractable nausea and vomiting or a severe headache please notify your doctor immediately.  FOLLOW-UP:  Please make an appointment with your surgeon as instructed. You do not need to follow up with anesthesia unless specifically instructed to do so.  WOUND CARE INSTRUCTIONS (if applicable):  Keep a dry clean dressing on the anesthesia/puncture wound site if there is drainage.  Once the wound has quit draining you may leave it open to air.  Generally you should leave the bandage intact for twenty four hours unless there is drainage.  If the epidural site drains for more than 36-48 hours please call the anesthesia department.  QUESTIONS?:  Please feel free to call your physician or the hospital operator if you have any questions, and they will be happy to assist you.

## 2014-02-20 NOTE — Pre-Procedure Instructions (Signed)
Patient given information to sign up for my chart at home. 

## 2014-02-25 ENCOUNTER — Encounter (HOSPITAL_COMMUNITY)
Admission: RE | Disposition: A | Discharge status: Home / Self Care | Payer: Self-pay | Source: Ambulatory Visit | Attending: Obstetrics & Gynecology

## 2014-02-25 ENCOUNTER — Encounter (HOSPITAL_COMMUNITY): Payer: Self-pay | Admitting: *Deleted

## 2014-02-25 ENCOUNTER — Encounter (HOSPITAL_COMMUNITY): Payer: Medicare Other | Admitting: Anesthesiology

## 2014-02-25 ENCOUNTER — Ambulatory Visit (HOSPITAL_COMMUNITY)
Admission: RE | Admit: 2014-02-25 | Discharge: 2014-02-25 | Disposition: A | Discharge status: Home / Self Care | Payer: Medicare Other | Source: Ambulatory Visit | Attending: Obstetrics & Gynecology | Admitting: Obstetrics & Gynecology

## 2014-02-25 ENCOUNTER — Ambulatory Visit (HOSPITAL_COMMUNITY): Payer: Medicare Other | Admitting: Anesthesiology

## 2014-02-25 DIAGNOSIS — R9389 Abnormal findings on diagnostic imaging of other specified body structures: Secondary | ICD-10-CM | POA: Diagnosis not present

## 2014-02-25 DIAGNOSIS — K219 Gastro-esophageal reflux disease without esophagitis: Secondary | ICD-10-CM | POA: Insufficient documentation

## 2014-02-25 DIAGNOSIS — Z880 Allergy status to penicillin: Secondary | ICD-10-CM | POA: Diagnosis not present

## 2014-02-25 DIAGNOSIS — M4800 Spinal stenosis, site unspecified: Secondary | ICD-10-CM | POA: Diagnosis not present

## 2014-02-25 DIAGNOSIS — Z9889 Other specified postprocedural states: Secondary | ICD-10-CM

## 2014-02-25 DIAGNOSIS — Z882 Allergy status to sulfonamides status: Secondary | ICD-10-CM | POA: Insufficient documentation

## 2014-02-25 DIAGNOSIS — I10 Essential (primary) hypertension: Secondary | ICD-10-CM | POA: Insufficient documentation

## 2014-02-25 DIAGNOSIS — N840 Polyp of corpus uteri: Secondary | ICD-10-CM | POA: Insufficient documentation

## 2014-02-25 DIAGNOSIS — Z79899 Other long term (current) drug therapy: Secondary | ICD-10-CM | POA: Insufficient documentation

## 2014-02-25 DIAGNOSIS — N950 Postmenopausal bleeding: Secondary | ICD-10-CM

## 2014-02-25 DIAGNOSIS — M1710 Unilateral primary osteoarthritis, unspecified knee: Secondary | ICD-10-CM | POA: Insufficient documentation

## 2014-02-25 HISTORY — PX: POLYPECTOMY: SHX5525

## 2014-02-25 HISTORY — PX: HYSTEROSCOPY WITH D & C: SHX1775

## 2014-02-25 SURGERY — DILATATION AND CURETTAGE /HYSTEROSCOPY
Anesthesia: General | Site: Vagina

## 2014-02-25 MED ORDER — MIDAZOLAM HCL 2 MG/2ML IJ SOLN
INTRAMUSCULAR | Status: AC
  Filled 2014-02-25: qty 2

## 2014-02-25 MED ORDER — LIDOCAINE HCL (PF) 1 % IJ SOLN
INTRAMUSCULAR | Status: AC
  Filled 2014-02-25: qty 5

## 2014-02-25 MED ORDER — ARTIFICIAL TEARS OP OINT
TOPICAL_OINTMENT | OPHTHALMIC | Status: AC
  Filled 2014-02-25: qty 3.5

## 2014-02-25 MED ORDER — GLYCOPYRROLATE 0.2 MG/ML IJ SOLN
INTRAMUSCULAR | Status: AC
  Filled 2014-02-25: qty 1

## 2014-02-25 MED ORDER — LIDOCAINE HCL (CARDIAC) 20 MG/ML IV SOLN
INTRAVENOUS | Status: DC | PRN
  Administered 2014-02-25: 50 mg via INTRAVENOUS

## 2014-02-25 MED ORDER — SODIUM CHLORIDE BACTERIOSTATIC 0.9 % IJ SOLN
INTRAMUSCULAR | Status: AC
  Filled 2014-02-25: qty 20

## 2014-02-25 MED ORDER — ONDANSETRON HCL 4 MG/2ML IJ SOLN
4.0000 mg | Freq: Once | INTRAMUSCULAR | Status: AC
  Administered 2014-02-25: 4 mg via INTRAVENOUS

## 2014-02-25 MED ORDER — ROCURONIUM BROMIDE 50 MG/5ML IV SOLN
INTRAVENOUS | Status: AC
  Filled 2014-02-25: qty 1

## 2014-02-25 MED ORDER — FENTANYL CITRATE 0.05 MG/ML IJ SOLN
INTRAMUSCULAR | Status: AC
  Filled 2014-02-25: qty 5

## 2014-02-25 MED ORDER — LACTATED RINGERS IV SOLN
INTRAVENOUS | Status: DC | PRN
  Administered 2014-02-25: 08:00:00 via INTRAVENOUS

## 2014-02-25 MED ORDER — CEFAZOLIN SODIUM-DEXTROSE 2-3 GM-% IV SOLR
2.0000 g | INTRAVENOUS | Status: AC
  Administered 2014-02-25: 2 g via INTRAVENOUS
  Filled 2014-02-25: qty 50

## 2014-02-25 MED ORDER — FENTANYL CITRATE 0.05 MG/ML IJ SOLN
25.0000 ug | INTRAMUSCULAR | Status: AC
  Administered 2014-02-25 (×2): 25 ug via INTRAVENOUS

## 2014-02-25 MED ORDER — FENTANYL CITRATE 0.05 MG/ML IJ SOLN: 25.0000 ug | INTRAMUSCULAR | Status: DC | PRN

## 2014-02-25 MED ORDER — 0.9 % SODIUM CHLORIDE (POUR BTL) OPTIME
TOPICAL | Status: DC | PRN
  Administered 2014-02-25: 1000 mL

## 2014-02-25 MED ORDER — PHENYLEPHRINE HCL 10 MG/ML IJ SOLN
INTRAMUSCULAR | Status: DC | PRN
  Administered 2014-02-25 (×2): 100 ug via INTRAVENOUS

## 2014-02-25 MED ORDER — SODIUM CHLORIDE 0.9 % IR SOLN
Status: DC | PRN
  Administered 2014-02-25: 1000 mL

## 2014-02-25 MED ORDER — PROPOFOL 10 MG/ML IV EMUL
INTRAVENOUS | Status: AC
  Filled 2014-02-25: qty 20

## 2014-02-25 MED ORDER — SUCCINYLCHOLINE CHLORIDE 20 MG/ML IJ SOLN
INTRAMUSCULAR | Status: DC | PRN
  Administered 2014-02-25: 100 mg via INTRAVENOUS

## 2014-02-25 MED ORDER — PROPOFOL 10 MG/ML IV BOLUS
INTRAVENOUS | Status: DC | PRN
  Administered 2014-02-25: 150 mg via INTRAVENOUS

## 2014-02-25 MED ORDER — ARTIFICIAL TEARS OP OINT
TOPICAL_OINTMENT | OPHTHALMIC | Status: DC | PRN
  Administered 2014-02-25: 1 via OPHTHALMIC

## 2014-02-25 MED ORDER — GLYCOPYRROLATE 0.2 MG/ML IJ SOLN
0.2000 mg | Freq: Once | INTRAMUSCULAR | Status: AC
  Administered 2014-02-25: 0.2 mg via INTRAVENOUS

## 2014-02-25 MED ORDER — ONDANSETRON HCL 4 MG/2ML IJ SOLN
INTRAMUSCULAR | Status: AC
  Filled 2014-02-25: qty 2

## 2014-02-25 MED ORDER — PHENYLEPHRINE HCL 10 MG/ML IJ SOLN
INTRAMUSCULAR | Status: AC
  Filled 2014-02-25: qty 1

## 2014-02-25 MED ORDER — LACTATED RINGERS IV SOLN
INTRAVENOUS | Status: DC
  Administered 2014-02-25: 1000 mL via INTRAVENOUS

## 2014-02-25 MED ORDER — MIDAZOLAM HCL 2 MG/2ML IJ SOLN
1.0000 mg | INTRAMUSCULAR | Status: DC | PRN
  Administered 2014-02-25: 2 mg via INTRAVENOUS

## 2014-02-25 MED ORDER — FENTANYL CITRATE 0.05 MG/ML IJ SOLN
INTRAMUSCULAR | Status: DC | PRN
  Administered 2014-02-25 (×5): 50 ug via INTRAVENOUS

## 2014-02-25 MED ORDER — FENTANYL CITRATE 0.05 MG/ML IJ SOLN
INTRAMUSCULAR | Status: AC
  Filled 2014-02-25: qty 2

## 2014-02-25 MED ORDER — ONDANSETRON HCL 8 MG PO TABS: 8.0000 mg | ORAL_TABLET | Freq: Three times a day (TID) | ORAL | Status: DC | PRN

## 2014-02-25 MED ORDER — ONDANSETRON HCL 4 MG/2ML IJ SOLN: 4.0000 mg | Freq: Once | INTRAMUSCULAR | Status: DC | PRN

## 2014-02-25 SURGICAL SUPPLY — 34 items
BAG HAMPER (MISCELLANEOUS) ×2 IMPLANT
BLADE SURG 15 STRL LF DISP TIS (BLADE) ×1 IMPLANT
BLADE SURG 15 STRL SS (BLADE) ×2
CLOTH BEACON ORANGE TIMEOUT ST (SAFETY) ×2 IMPLANT
COVER LIGHT HANDLE STERIS (MISCELLANEOUS) ×4 IMPLANT
FORMALIN 10 PREFIL 120ML (MISCELLANEOUS) ×2 IMPLANT
FORMALIN 10 PREFIL 480ML (MISCELLANEOUS) ×1 IMPLANT
GAUZE SPONGE 4X4 16PLY XRAY LF (GAUZE/BANDAGES/DRESSINGS) ×2 IMPLANT
GLOVE BIOGEL PI IND STRL 7.0 (GLOVE) IMPLANT
GLOVE BIOGEL PI IND STRL 8 (GLOVE) ×1 IMPLANT
GLOVE BIOGEL PI INDICATOR 7.0 (GLOVE) ×2
GLOVE BIOGEL PI INDICATOR 8 (GLOVE) ×1
GLOVE ECLIPSE 8.0 STRL XLNG CF (GLOVE) ×2 IMPLANT
GLOVE OPTIFIT SS 6.5 STRL BRWN (GLOVE) ×1 IMPLANT
GOWN STRL REUS W/TWL LRG LVL3 (GOWN DISPOSABLE) ×3 IMPLANT
GOWN STRL REUS W/TWL XL LVL3 (GOWN DISPOSABLE) ×2 IMPLANT
INST SET HYSTEROSCOPY (KITS) ×2 IMPLANT
IV NS 1000ML (IV SOLUTION) ×2
IV NS 1000ML BAXH (IV SOLUTION) ×1 IMPLANT
KIT ROOM TURNOVER AP CYSTO (KITS) ×2 IMPLANT
MANIFOLD NEPTUNE II (INSTRUMENTS) ×2 IMPLANT
NS IRRIG 1000ML POUR BTL (IV SOLUTION) ×2 IMPLANT
PACK BASIC III (CUSTOM PROCEDURE TRAY)
PACK PERI GYN (CUSTOM PROCEDURE TRAY) ×2 IMPLANT
PACK SRG BSC III STRL LF ECLPS (CUSTOM PROCEDURE TRAY) ×1 IMPLANT
PAD ARMBOARD 7.5X6 YLW CONV (MISCELLANEOUS) ×2 IMPLANT
PAD TELFA 3X4 1S STER (GAUZE/BANDAGES/DRESSINGS) ×2 IMPLANT
SET BASIN LINEN APH (SET/KITS/TRAYS/PACK) ×2 IMPLANT
SET IRRIG Y TYPE TUR BLADDER L (SET/KITS/TRAYS/PACK) ×2 IMPLANT
SHEET LAVH (DRAPES) ×2 IMPLANT
SUT MNCRL 0 VIOLET CTX 36 (SUTURE) ×1 IMPLANT
SUT MONOCRYL 0 CTX 36 (SUTURE)
TOWEL OR 17X26 4PK STRL BLUE (TOWEL DISPOSABLE) ×2 IMPLANT
YANKAUER SUCT BULB TIP 10FT TU (MISCELLANEOUS) ×2 IMPLANT

## 2014-02-25 NOTE — Discharge Instructions (Signed)
Hysteroscopy Hysteroscopy is a procedure used for looking inside the womb (uterus). It may be done for various reasons, including:  To evaluate abnormal bleeding, fibroid (benign, noncancerous) tumors, polyps, scar tissue (adhesions), and possibly cancer of the uterus.  To look for lumps (tumors) and other uterine growths.  To look for causes of why a woman cannot get pregnant (infertility), causes of recurrent loss of pregnancy (miscarriages), or a lost intrauterine device (IUD).  To perform a sterilization by blocking the fallopian tubes from inside the uterus. In this procedure, a thin, flexible tube with a tiny light and camera on the end of it (hysteroscope) is used to look inside the uterus. A hysteroscopy should be done right after a menstrual period to be sure you are not pregnant. LET [facility] CARE PROVIDER KNOW ABOUT:   Any allergies you have.  All medicines you are taking, including vitamins, herbs, eye drops, creams, and over-the-counter medicines.  Previous problems you or members of your family have had with the use of anesthetics.  Any blood disorders you have.  Previous surgeries you have had.  Medical conditions you have. RISKS AND COMPLICATIONS  Generally, this is a safe procedure. However, as with any procedure, complications can occur. Possible complications include:  Putting a hole in the uterus.  Excessive bleeding.  Infection.  Damage to the cervix.  Injury to other organs.  Allergic reaction to medicines.  Too much fluid used in the uterus for the procedure. BEFORE THE PROCEDURE   Ask your health care provider about changing or stopping any regular medicines.  Do not take aspirin or blood thinners for 1 week before the procedure, or as directed by your health care provider. These can cause bleeding.  If you smoke, do not smoke for 2 weeks before the procedure.  In some cases, a medicine is placed in the cervix the day before the procedure.  This medicine makes the cervix have a larger opening (dilate). This makes it easier for the instrument to be inserted into the uterus during the procedure.  Do not eat or drink anything for at least 8 hours before the surgery.  Arrange for someone to take you home after the procedure. PROCEDURE   You may be given a medicine to relax you (sedative). You may also be given one of the following:  A medicine that numbs the area around the cervix (local anesthetic).  A medicine that makes you sleep through the procedure (general anesthetic).  The hysteroscope is inserted through the vagina into the uterus. The camera on the hysteroscope sends a picture to a TV screen. This gives the surgeon a good view inside the uterus.  During the procedure, air or a liquid is put into the uterus, which allows the surgeon to see better.  Sometimes, tissue is gently scraped from inside the uterus. These tissue samples are sent to a lab for testing. AFTER THE PROCEDURE   If you had a general anesthetic, you may be groggy for a couple hours after the procedure.  If you had a local anesthetic, you will be able to go home as soon as you are stable and feel ready.  You may have some cramping. This normally lasts for a couple days.  You may have bleeding, which varies from light spotting for a few days to menstrual-like bleeding for 3 7 days. This is normal.  If your test results are not back during the visit, make an appointment with your health care provider to find out  the results. Document Released: 01/15/2001 Document Revised: 07/30/2013 Document Reviewed: 05/08/2013 [facility] Patient Information 2014 [place], [place].

## 2014-02-25 NOTE — Progress Notes (Signed)
Spoke with Dr. Elonda Husky about patient taking Ancef, since she was allergic to Penicillin. He stated all right to give her Ancef.

## 2014-02-25 NOTE — Op Note (Signed)
Preoperative diagnosis:  Post menopausal bleeding                                         Endometrial polyps                                         Thickened endometrial linig  Postoperative diagnoses: Same as above + 4 endometrial polyps  Procedure: Hysteroscopy, uterine curettage, with removal of polyps  Surgeon: Florian Buff MD  Anesthesia: Laryngeal mask airway  Findings: The endometrium was significant for 4 endometrial polyps. There were no fibroid or other abnormalities.  Description of operation: The patient was taken to the operating room and placed in the supine position. She underwent general anesthesia using the laryngeal mask airway. She was placed in the dorsal lithotomy position and prepped and draped in the usual sterile fashion. A Graves speculum was placed and the anterior cervical lip was grasped with a single-tooth tenaculum. The cervix was dilated serially to allow passage of the hysteroscope. Diagnostic hysteroscopy was performed and was found to be significant for 4 separate endometrial polyps. A vigorous uterine curettage was then performed and all tissue sent to pathology for evaluation.  All 4 polyps were removed in total.  All of the equipment worked well throughout the procedure.  The patient was awakened from anesthesia and taken to the recovery room in good stable condition and all counts were correct. She received 2 g of Ancef and 30 mg of Toradol preoperatively. She will be discharged from the recovery room and followed up in the office in 1- 2 weeks.  Nicole [name] [name] 02/25/2014 10:00 AM

## 2014-02-25 NOTE — Anesthesia Procedure Notes (Addendum)
Procedure Name: Intubation Date/Time: 02/25/2014 9:29 AM Performed by: Andree Elk, AMY A Pre-anesthesia Checklist: Patient identified, Patient being monitored, Timeout performed, Emergency Drugs available and Suction available Patient Re-evaluated:Patient Re-evaluated prior to inductionOxygen Delivery Method: Circle System Utilized Preoxygenation: Pre-oxygenation with 100% oxygen Intubation Type: IV induction Ventilation: Mask ventilation with difficulty Laryngoscope Size: 3 and Miller Grade View: Grade I Tube type: Oral Tube size: 7.0 mm Number of attempts: 1 Airway Equipment and Method: stylet Placement Confirmation: ETT inserted through vocal cords under direct vision,  positive ETCO2 and breath sounds checked- equal and bilateral Secured at: 21 cm Tube secured with: Tape Dental Injury: Teeth and Oropharynx as per pre-operative assessment    LMA placement attempted without success prior to ETT;  VSS throughout

## 2014-02-25 NOTE — Anesthesia Postprocedure Evaluation (Signed)
  Anesthesia Post-op Note  Patient: Nicole [name]  Procedure(s) Performed: Procedure(s): DILATATION AND CURETTAGE /HYSTEROSCOPY (N/A) POLYPECTOMY (N/A)  Patient Location: PACU  Anesthesia Type:General  Level of Consciousness: awake, alert , oriented and patient cooperative  Airway and Oxygen Therapy: Patient Spontanous Breathing and Patient connected to face mask oxygen  Post-op Pain: mild  Post-op Assessment: Post-op Vital signs reviewed, Patient's Cardiovascular Status Stable, Respiratory Function Stable, Patent Airway, No signs of Nausea or vomiting and Pain level controlled  Post-op Vital Signs: Reviewed and stable  Last Vitals:  Filed Vitals:   02/25/14 1015  BP: 98/56  Pulse: 87  Temp: 36.5 C  Resp: 13    Complications: No apparent anesthesia complications

## 2014-02-25 NOTE — Transfer of Care (Signed)
Immediate Anesthesia Transfer of Care Note  Patient: Nicole [name]  Procedure(s) Performed: Procedure(s): DILATATION AND CURETTAGE /HYSTEROSCOPY (N/A) POLYPECTOMY (N/A)  Patient Location: PACU  Anesthesia Type:General  Level of Consciousness: awake, oriented and patient cooperative  Airway & Oxygen Therapy: Patient Spontanous Breathing and Patient connected to face mask oxygen  Post-op Assessment: Report given to PACU RN and Post -op Vital signs reviewed and stable  Post vital signs: Reviewed and stable  Complications: No apparent anesthesia complications

## 2014-02-25 NOTE — Anesthesia Preprocedure Evaluation (Signed)
Anesthesia Evaluation  Patient identified by MRN, date of birth, ID band Patient awake    Reviewed: Allergy & Precautions, H&P , NPO status , Patient's Chart, lab work & pertinent test results  History of Anesthesia Complications Negative for: history of anesthetic complications  Airway Mallampati: III TM Distance: >3 FB Neck ROM: Full    Dental  (+) Edentulous Upper, Edentulous Lower   Pulmonary neg pulmonary ROS,  breath sounds clear to auscultation        Cardiovascular hypertension, Pt. on medications Rhythm:Regular Rate:Normal     Neuro/Psych  Neuromuscular disease    GI/Hepatic GERD-  Medicated and Controlled,  Endo/Other  Morbid obesity  Renal/GU      Musculoskeletal   Abdominal   Peds  Hematology   Anesthesia Other Findings   Reproductive/Obstetrics                           Anesthesia Physical Anesthesia Plan  ASA: III  Anesthesia Plan: General   Post-op Pain Management:    Induction: Intravenous  Airway Management Planned: LMA  Additional Equipment:   Intra-op Plan:   Post-operative Plan: Extubation in OR  Informed Consent: I have reviewed the patients History and Physical, chart, labs and discussed the procedure including the risks, benefits and alternatives for the proposed anesthesia with the patient or authorized representative who has indicated his/her understanding and acceptance.     Plan Discussed with:   Anesthesia Plan Comments: (Possible GOT discussed.)        Anesthesia Quick Evaluation

## 2014-02-25 NOTE — H&P (Signed)
Preoperative History and Physical  Nicole [name] is a 68 y.o. No obstetric history on file. with No LMP recorded. Patient is postmenopausal. admitted for a hysteroscopic removal of endometrial polyp.[name]  Pateitn presented to the office with a week of bleeding, endometrium was thickened on sonogram.  EMBx was significant for a benign polyp. As a result she uis here for removal and uterine curettage  PMH:    Past Medical History  Diagnosis Date  . Hypertension   . Gastroesophageal reflux   . Spinal stenosis   . Carpal tunnel syndrome, bilateral   . AC (acromioclavicular) joint bone spurs     lt shoulder  . Arthritis   . Shingles     PSH:     Past Surgical History  Procedure Laterality Date  . Cholecystectomy    . Colonoscopy  06/11/2012    Procedure: COLONOSCOPY;  Surgeon: Jamesetta So, MD;  Location: [facility];  Service: Gastroenterology;  Laterality: N/A;    POb/GynH:      OB History   Grav Para Term Preterm Abortions TAB SAB Ect Mult Living                  SH:   History  Substance Use Topics  . Smoking status: Never Smoker   . Smokeless tobacco: Never Used  . Alcohol Use: No    FH:   History reviewed. No pertinent family history.   Allergies:  Allergies  Allergen Reactions  . Sulfa Antibiotics Other (See Comments)    Sores  . Penicillins Rash    Medications:      Current facility-administered medications:ceFAZolin (ANCEF) IVPB 2 g/50 mL premix, 2 g, Intravenous, On Call to OR, Florian Buff, MD;  lactated ringers infusion, , Intravenous, Continuous, Lerry Liner, MD, Last Rate: 75 mL/hr at 02/25/14 0821, 1,000 mL at 02/25/14 [id number];  midazolam (VERSED) injection 1-2 mg, 1-2 mg, Intravenous, Q5 Min x 3 PRN, Lerry Liner, MD, 2 mg at 02/25/14 [id number]  Review of Systems:   Review of Systems  Constitutional: Negative for fever, chills, weight loss, malaise/fatigue and diaphoresis.  HENT: Negative for hearing loss, ear pain, nosebleeds, congestion, sore  throat, neck pain, tinnitus and ear discharge.   Eyes: Negative for blurred vision, double vision, photophobia, pain, discharge and redness.  Respiratory: Negative for cough, hemoptysis, sputum production, shortness of breath, wheezing and stridor.   Cardiovascular: Negative for chest pain, palpitations, orthopnea, claudication, leg swelling and PND.  Gastrointestinal: negative for abdominal pain. Negative for heartburn, nausea, vomiting, diarrhea, constipation, blood in stool and melena.  Genitourinary: Negative for dysuria, urgency, frequency, hematuria and flank pain.  Musculoskeletal: Negative for myalgias, back pain, joint pain and falls.  Skin: Negative for itching and rash.  Neurological: Negative for dizziness, tingling, tremors, sensory change, speech change, focal weakness, seizures, loss of consciousness, weakness and headaches.  Endo/Heme/Allergies: Negative for environmental allergies and polydipsia. Does not bruise/bleed easily.  Psychiatric/Behavioral: Negative for depression, suicidal ideas, hallucinations, memory loss and substance abuse. The patient is not nervous/anxious and does not have insomnia.      PHYSICAL EXAM:  Temperature 98.3 F (36.8 C), temperature source Oral, height 5\' 1"  (1.549 m), weight 262 lb (118.842 kg).    Vitals reviewed. Constitutional: She is oriented to person, place, and time. She appears well-developed and well-nourished.  HENT:  Head: Normocephalic and atraumatic.  Right Ear: External ear normal.  Left Ear: External ear normal.  Nose: Nose normal.  Mouth/Throat: Oropharynx is clear and moist.  Eyes: Conjunctivae and EOM are normal. Pupils are equal, round, and reactive to light. Right eye exhibits no discharge. Left eye exhibits no discharge. No scleral icterus.  Neck: Normal range of motion. Neck supple. No tracheal deviation present. No thyromegaly present.  Cardiovascular: Normal rate, regular rhythm, normal heart sounds and intact  distal pulses.  Exam reveals no gallop and no friction rub.   No murmur heard. Respiratory: Effort normal and breath sounds normal. No respiratory distress. She has no wheezes. She has no rales. She exhibits no tenderness.  GI: Soft. Bowel sounds are normal. She exhibits no distension and no mass. There is tenderness. There is no rebound and no guarding.  Genitourinary:       Vulva is normal without lesions Vagina is pink moist without discharge Cervix normal in appearance and pap is normal Uterus is norma see below Adnexa is negative with normal sized ovaries by sonogram  Musculoskeletal: Normal range of motion. She exhibits no edema and no tenderness.  Neurological: She is alert and oriented to person, place, and time. She has normal reflexes. She displays normal reflexes. No cranial nerve deficit. She exhibits normal muscle tone. Coordination normal.  Skin: Skin is warm and dry. No rash noted. No erythema. No pallor.  Psychiatric: She has a normal mood and affect. Her behavior is normal. Judgment and thought content normal.    Labs: Results for orders placed during the hospital encounter of 02/20/14 (from the past 336 hour(s))  URINALYSIS, ROUTINE W REFLEX MICROSCOPIC   Collection Time    02/20/14 11:16 AM      Result Value Ref Range   Color, Urine YELLOW  YELLOW   APPearance CLEAR  CLEAR   Specific Gravity, Urine 1.015  1.005 - 1.030   pH 5.5  5.0 - 8.0   Glucose, UA NEGATIVE  NEGATIVE mg/dL   Hgb urine dipstick NEGATIVE  NEGATIVE   Bilirubin Urine NEGATIVE  NEGATIVE   Ketones, ur NEGATIVE  NEGATIVE mg/dL   Protein, ur NEGATIVE  NEGATIVE mg/dL   Urobilinogen, UA 0.2  0.0 - 1.0 mg/dL   Nitrite NEGATIVE  NEGATIVE   Leukocytes, UA NEGATIVE  NEGATIVE  CBC   Collection Time    02/20/14 11:30 AM      Result Value Ref Range   WBC 8.3  4.0 - 10.5 K/uL   RBC 4.06  3.87 - 5.11 MIL/uL   Hemoglobin 11.9 (*) 12.0 - 15.0 g/dL   HCT 36.0  36.0 - 46.0 %   MCV 88.7  78.0 - 100.0 fL    MCH 29.3  26.0 - 34.0 pg   MCHC 33.1  30.0 - 36.0 g/dL   RDW 14.4  11.5 - 15.5 %   Platelets 343  150 - 400 K/uL  COMPREHENSIVE METABOLIC PANEL   Collection Time    02/20/14 11:30 AM      Result Value Ref Range   Sodium 141  137 - 147 mEq/L   Potassium 4.3  3.7 - 5.3 mEq/L   Chloride 101  96 - 112 mEq/L   CO2 29  19 - 32 mEq/L   Glucose, Bld 76  70 - 99 mg/dL   BUN 18  6 - 23 mg/dL   Creatinine, Ser 1.18 (*) 0.50 - 1.10 mg/dL   Calcium 9.7  8.4 - 10.5 mg/dL   Total Protein 6.9  6.0 - 8.3 g/dL   Albumin 3.3 (*) 3.5 - 5.2 g/dL   AST 19  0 - 37 U/L  ALT 25  0 - 35 U/L   Alkaline Phosphatase 130 (*) 39 - 117 U/L   Total Bilirubin 0.3  0.3 - 1.2 mg/dL   GFR calc non Af Amer 47 (*) >90 mL/min   GFR calc Af Amer 54 (*) >90 mL/min    EKG: Orders placed during the hospital encounter of 12/31/13  . EKG 12-LEAD  . EKG 12-LEAD  . EKG    Imaging Studies: US Transvaginal Non-ob  Mar 02, 2014   GYNECOLOGIC SONOGRAM   ERNEST SAK is a 68 y.o. No obstetric history on file.  for a pelvic  sonogram for postmenopausal bleeding.  Uterus                      9.3 x 6.4 x 5.2 cm, heterogeneous echotexture  noted  Endometrium          18.6 mm, symmetrical, complex appearance  Right ovary             2.7 x 1.8 x 1.3 cm,   Left ovary                2.5 x 1.9 x 1.7 cm,   No free fluid noted within pelvis  Technician Comments:  Anteverted uterus with heterogeneous echotexture noted, Endometrium =  18.19mm symmetrical complex appearance, bilateral ovaries appears wnl, no  free fluid noted   Alicia Amel 01/30/2014 12:19 PM Clinical Impression and recommendations:  I have reviewed the sonogram results above.  Combined with the patient's current clinical course, below are my  impressions and any appropriate recommendations for management based on  the sonographic findings:  1. Thickened endometrium for postmenopausal status. 2. Normal adnexal structures. 3. Evaluation of Endometrium planned by Dr Elonda Husky   Jonnie Kind        Assessment: Patient Active Problem List   Diagnosis Date Noted  . Endometrial polyp 02/06/2014  . Postmenopausal bleeding 01/30/2014  . GERD (gastroesophageal reflux disease) 09/29/2013  . Spinal stenosis, thoracic 09/29/2013  . Shingles 09/29/2013  . Thoracic or lumbosacral neuritis or radiculitis, unspecified 05/04/2011  . Difficulty in walking 05/04/2011  . Muscle weakness (generalized) 05/04/2011  . KNEE, ARTHRITIS, DEGEN./OSTEO 04/07/2009  . KNEE PAIN 04/07/2009    Plan: Hysteroscopy uterine curettage with removal of endometrial polyp  Florian Buff 02/25/2014 9:03 AM

## 2014-02-26 ENCOUNTER — Encounter (HOSPITAL_COMMUNITY): Payer: Self-pay | Admitting: Obstetrics & Gynecology

## 2014-03-02 ENCOUNTER — Telehealth: Payer: Self-pay | Admitting: Obstetrics & Gynecology

## 2014-03-02 NOTE — Telephone Encounter (Signed)
Pt had a D&C/Hysteroscopy on 02/25/2014, continues to have some spotting and pain, both have improved since surgery. Pt states taking Hydrocodone as prescribed for pain. Informed pt symptoms are WNL per Dr. Elonda Husky and to keep her post op appt on 03/05/2014. If pain or bleeding increases to call our office back. Pt verbalized understanding.

## 2014-03-05 ENCOUNTER — Encounter: Payer: Medicare Other | Admitting: Obstetrics & Gynecology

## 2014-03-05 ENCOUNTER — Encounter: Payer: Self-pay | Admitting: Obstetrics & Gynecology

## 2014-03-05 ENCOUNTER — Ambulatory Visit (INDEPENDENT_AMBULATORY_CARE_PROVIDER_SITE_OTHER): Payer: Medicare Other | Admitting: Obstetrics & Gynecology

## 2014-03-05 VITALS — BP 106/70 | Wt 262.0 lb

## 2014-03-05 DIAGNOSIS — N840 Polyp of corpus uteri: Secondary | ICD-10-CM

## 2014-03-05 DIAGNOSIS — Z9889 Other specified postprocedural states: Secondary | ICD-10-CM

## 2014-03-05 DIAGNOSIS — N950 Postmenopausal bleeding: Secondary | ICD-10-CM

## 2014-03-05 MED ORDER — MEDROXYPROGESTERONE ACETATE 2.5 MG PO TABS: 2.5000 mg | ORAL_TABLET | Freq: Every day | ORAL | Status: DC

## 2014-03-05 NOTE — Progress Notes (Signed)
Patient ID: Nicole [name], female   DOB: 02-02-1946, 68 y.o.   MRN: [id number] Pathology  Report is benign, endometrial polyps, proliferative endometrium  Pt is probably having some peripheral fat production of estrone Will manage with daily progesterone 2.5 mg daily  Follow up sonographic evaluation in 1 year  Past Medical History  Diagnosis Date  . Hypertension   . Gastroesophageal reflux   . Spinal stenosis   . Carpal tunnel syndrome, bilateral   . AC (acromioclavicular) joint bone spurs     lt shoulder  . Arthritis   . Shingles     Past Surgical History  Procedure Laterality Date  . Cholecystectomy    . Colonoscopy  06/11/2012    Procedure: COLONOSCOPY;  Surgeon: Jamesetta So, MD;  Location: [facility];  Service: Gastroenterology;  Laterality: N/A;  . Hysteroscopy w/d&c N/A 02/25/2014    Procedure: DILATATION AND CURETTAGE /HYSTEROSCOPY;  Surgeon: Florian Buff, MD;  Location: [facility];  Service: Gynecology;  Laterality: N/A;  . Polypectomy N/A 02/25/2014    Procedure: POLYPECTOMY;  Surgeon: Florian Buff, MD;  Location: [facility];  Service: Gynecology;  Laterality: N/A;    OB History   Grav Para Term Preterm Abortions TAB SAB Ect Mult Living                  Allergies  Allergen Reactions  . Sulfa Antibiotics Other (See Comments)    Sores  . Penicillins Rash    History   Social History  . Marital Status: Widowed    Spouse Name: N/A    Number of Children: N/A  . Years of Education: N/A   Social History Main Topics  . Smoking status: Never Smoker   . Smokeless tobacco: Never Used  . Alcohol Use: No  . Drug Use: No  . Sexual Activity: Yes    Birth Control/ Protection: Post-menopausal   Other Topics Concern  . None   Social History Narrative  . None    History reviewed. No pertinent family history.

## 2014-03-17 DIAGNOSIS — M069 Rheumatoid arthritis, unspecified: Secondary | ICD-10-CM | POA: Diagnosis not present

## 2014-04-29 ENCOUNTER — Ambulatory Visit (HOSPITAL_COMMUNITY)
Admission: RE | Admit: 2014-04-29 | Discharge: 2014-04-29 | Disposition: A | Discharge status: Home / Self Care | Payer: Medicare Other | Source: Ambulatory Visit | Attending: Family Medicine | Admitting: Family Medicine

## 2014-04-29 ENCOUNTER — Other Ambulatory Visit (HOSPITAL_COMMUNITY): Payer: Self-pay | Admitting: Physical Medicine and Rehabilitation

## 2014-04-29 DIAGNOSIS — M79669 Pain in unspecified lower leg: Secondary | ICD-10-CM

## 2014-04-29 DIAGNOSIS — M79609 Pain in unspecified limb: Secondary | ICD-10-CM | POA: Diagnosis not present

## 2014-04-29 DIAGNOSIS — M545 Low back pain, unspecified: Secondary | ICD-10-CM | POA: Diagnosis not present

## 2014-04-29 DIAGNOSIS — G894 Chronic pain syndrome: Secondary | ICD-10-CM | POA: Insufficient documentation

## 2014-04-29 NOTE — Progress Notes (Signed)
*  Preliminary Results* Bilateral lower extremity venous duplex completed. Visualized veins of bilateral lower extremities are negative for deep vein thrombosis. There is evidence of a right Baker's cyst.   Preliminary results discussed with Marlowe Kays of Dr.Ramos' office.  04/29/2014  Maudry Mayhew, RVT, RDCS, RDMS

## 2014-06-11 DIAGNOSIS — L02619 Cutaneous abscess of unspecified foot: Secondary | ICD-10-CM | POA: Diagnosis not present

## 2014-06-11 DIAGNOSIS — L03039 Cellulitis of unspecified toe: Secondary | ICD-10-CM | POA: Diagnosis not present

## 2014-06-25 DIAGNOSIS — M25519 Pain in unspecified shoulder: Secondary | ICD-10-CM | POA: Diagnosis not present

## 2014-06-25 DIAGNOSIS — I10 Essential (primary) hypertension: Secondary | ICD-10-CM | POA: Diagnosis not present

## 2014-06-25 DIAGNOSIS — Z6841 Body Mass Index (BMI) 40.0 and over, adult: Secondary | ICD-10-CM | POA: Diagnosis not present

## 2014-06-26 ENCOUNTER — Other Ambulatory Visit (HOSPITAL_COMMUNITY): Payer: Self-pay | Admitting: Family Medicine

## 2014-06-26 DIAGNOSIS — M25511 Pain in right shoulder: Secondary | ICD-10-CM

## 2014-07-01 ENCOUNTER — Ambulatory Visit (HOSPITAL_COMMUNITY)
Admission: RE | Admit: 2014-07-01 | Discharge: 2014-07-01 | Disposition: A | Discharge status: Home / Self Care | Payer: Medicare Other | Source: Ambulatory Visit | Attending: Family Medicine | Admitting: Family Medicine

## 2014-07-01 DIAGNOSIS — M19019 Primary osteoarthritis, unspecified shoulder: Secondary | ICD-10-CM | POA: Insufficient documentation

## 2014-07-01 DIAGNOSIS — I10 Essential (primary) hypertension: Secondary | ICD-10-CM | POA: Diagnosis not present

## 2014-07-01 DIAGNOSIS — R7301 Impaired fasting glucose: Secondary | ICD-10-CM | POA: Diagnosis not present

## 2014-07-01 DIAGNOSIS — M25511 Pain in right shoulder: Secondary | ICD-10-CM

## 2014-07-01 DIAGNOSIS — M25519 Pain in unspecified shoulder: Secondary | ICD-10-CM | POA: Diagnosis not present

## 2014-09-01 DIAGNOSIS — M19042 Primary osteoarthritis, left hand: Secondary | ICD-10-CM | POA: Diagnosis not present

## 2014-09-01 DIAGNOSIS — M7532 Calcific tendinitis of left shoulder: Secondary | ICD-10-CM | POA: Diagnosis not present

## 2014-09-01 DIAGNOSIS — M19041 Primary osteoarthritis, right hand: Secondary | ICD-10-CM | POA: Diagnosis not present

## 2014-09-01 DIAGNOSIS — M4800 Spinal stenosis, site unspecified: Secondary | ICD-10-CM | POA: Diagnosis not present

## 2014-09-01 DIAGNOSIS — M7531 Calcific tendinitis of right shoulder: Secondary | ICD-10-CM | POA: Diagnosis not present

## 2014-09-01 DIAGNOSIS — M545 Low back pain: Secondary | ICD-10-CM | POA: Diagnosis not present

## 2014-09-09 DIAGNOSIS — M25511 Pain in right shoulder: Secondary | ICD-10-CM | POA: Diagnosis not present

## 2014-09-10 ENCOUNTER — Encounter (INDEPENDENT_AMBULATORY_CARE_PROVIDER_SITE_OTHER): Payer: Self-pay | Admitting: *Deleted

## 2014-09-16 DIAGNOSIS — R2230 Localized swelling, mass and lump, unspecified upper limb: Secondary | ICD-10-CM | POA: Diagnosis not present

## 2014-10-01 ENCOUNTER — Other Ambulatory Visit: Payer: Self-pay | Admitting: Orthopedic Surgery

## 2014-10-01 DIAGNOSIS — M25511 Pain in right shoulder: Secondary | ICD-10-CM

## 2014-10-10 ENCOUNTER — Ambulatory Visit
Admission: RE | Admit: 2014-10-10 | Discharge: 2014-10-10 | Disposition: A | Discharge status: Home / Self Care | Payer: Medicare Other | Source: Ambulatory Visit | Attending: Orthopedic Surgery | Admitting: Orthopedic Surgery

## 2014-10-10 DIAGNOSIS — M25511 Pain in right shoulder: Secondary | ICD-10-CM

## 2014-10-10 DIAGNOSIS — M66811 Spontaneous rupture of other tendons, right shoulder: Secondary | ICD-10-CM | POA: Diagnosis not present

## 2014-10-10 DIAGNOSIS — M19011 Primary osteoarthritis, right shoulder: Secondary | ICD-10-CM | POA: Diagnosis not present

## 2014-10-10 DIAGNOSIS — M7551 Bursitis of right shoulder: Secondary | ICD-10-CM | POA: Diagnosis not present

## 2014-11-26 DIAGNOSIS — J329 Chronic sinusitis, unspecified: Secondary | ICD-10-CM | POA: Diagnosis not present

## 2014-11-26 DIAGNOSIS — Z6841 Body Mass Index (BMI) 40.0 and over, adult: Secondary | ICD-10-CM | POA: Diagnosis not present

## 2014-11-26 DIAGNOSIS — R52 Pain, unspecified: Secondary | ICD-10-CM | POA: Diagnosis not present

## 2014-12-15 DIAGNOSIS — R2230 Localized swelling, mass and lump, unspecified upper limb: Secondary | ICD-10-CM | POA: Diagnosis not present

## 2014-12-15 DIAGNOSIS — G894 Chronic pain syndrome: Secondary | ICD-10-CM | POA: Diagnosis not present

## 2014-12-15 DIAGNOSIS — M25511 Pain in right shoulder: Secondary | ICD-10-CM | POA: Diagnosis not present

## 2014-12-23 DIAGNOSIS — R2230 Localized swelling, mass and lump, unspecified upper limb: Secondary | ICD-10-CM | POA: Diagnosis not present

## 2014-12-23 DIAGNOSIS — M25511 Pain in right shoulder: Secondary | ICD-10-CM | POA: Diagnosis not present

## 2014-12-29 ENCOUNTER — Ambulatory Visit (INDEPENDENT_AMBULATORY_CARE_PROVIDER_SITE_OTHER): Payer: Medicare Other | Admitting: Internal Medicine

## 2014-12-30 DIAGNOSIS — M25511 Pain in right shoulder: Secondary | ICD-10-CM | POA: Diagnosis not present

## 2014-12-30 DIAGNOSIS — R2230 Localized swelling, mass and lump, unspecified upper limb: Secondary | ICD-10-CM | POA: Diagnosis not present

## 2014-12-30 DIAGNOSIS — G8929 Other chronic pain: Secondary | ICD-10-CM | POA: Diagnosis not present

## 2015-02-18 DIAGNOSIS — M150 Primary generalized (osteo)arthritis: Secondary | ICD-10-CM | POA: Diagnosis not present

## 2015-02-18 DIAGNOSIS — Z6841 Body Mass Index (BMI) 40.0 and over, adult: Secondary | ICD-10-CM | POA: Diagnosis not present

## 2015-02-18 DIAGNOSIS — R600 Localized edema: Secondary | ICD-10-CM | POA: Diagnosis not present

## 2015-03-02 ENCOUNTER — Emergency Department (HOSPITAL_COMMUNITY): Payer: Medicare Other

## 2015-03-02 ENCOUNTER — Encounter (HOSPITAL_COMMUNITY): Payer: Self-pay

## 2015-03-02 ENCOUNTER — Emergency Department (HOSPITAL_COMMUNITY)
Admission: EM | Admit: 2015-03-02 | Discharge: 2015-03-02 | Disposition: A | Discharge status: Home / Self Care | Payer: Medicare Other | Attending: Emergency Medicine | Admitting: Emergency Medicine

## 2015-03-02 DIAGNOSIS — Z880 Allergy status to penicillin: Secondary | ICD-10-CM | POA: Diagnosis not present

## 2015-03-02 DIAGNOSIS — K219 Gastro-esophageal reflux disease without esophagitis: Secondary | ICD-10-CM | POA: Diagnosis not present

## 2015-03-02 DIAGNOSIS — Z8619 Personal history of other infectious and parasitic diseases: Secondary | ICD-10-CM | POA: Insufficient documentation

## 2015-03-02 DIAGNOSIS — Z8669 Personal history of other diseases of the nervous system and sense organs: Secondary | ICD-10-CM | POA: Diagnosis not present

## 2015-03-02 DIAGNOSIS — M1990 Unspecified osteoarthritis, unspecified site: Secondary | ICD-10-CM | POA: Insufficient documentation

## 2015-03-02 DIAGNOSIS — R51 Headache: Secondary | ICD-10-CM | POA: Diagnosis not present

## 2015-03-02 DIAGNOSIS — Z791 Long term (current) use of non-steroidal anti-inflammatories (NSAID): Secondary | ICD-10-CM | POA: Diagnosis not present

## 2015-03-02 DIAGNOSIS — I10 Essential (primary) hypertension: Secondary | ICD-10-CM | POA: Insufficient documentation

## 2015-03-02 DIAGNOSIS — Z79899 Other long term (current) drug therapy: Secondary | ICD-10-CM | POA: Diagnosis not present

## 2015-03-02 DIAGNOSIS — R519 Headache, unspecified: Secondary | ICD-10-CM

## 2015-03-02 MED ORDER — PROCHLORPERAZINE EDISYLATE 5 MG/ML IJ SOLN
10.0000 mg | Freq: Once | INTRAMUSCULAR | Status: AC
  Administered 2015-03-02: 10 mg via INTRAVENOUS
  Filled 2015-03-02: qty 2

## 2015-03-02 MED ORDER — MORPHINE SULFATE 4 MG/ML IJ SOLN
4.0000 mg | Freq: Once | INTRAMUSCULAR | Status: AC
  Administered 2015-03-02: 4 mg via INTRAVENOUS
  Filled 2015-03-02: qty 1

## 2015-03-02 MED ORDER — DIPHENHYDRAMINE HCL 50 MG/ML IJ SOLN
25.0000 mg | Freq: Once | INTRAMUSCULAR | Status: AC
  Administered 2015-03-02: 25 mg via INTRAVENOUS
  Filled 2015-03-02: qty 1

## 2015-03-02 NOTE — ED Notes (Signed)
Pt came from Dr office to r/u CVA. Pt reports at 1300 today she had a sudden onset of a rt side stabbing burning pain to head. Went to urgent care who requested pt to be seen. Denies any slurred speech. Pt a/o x3. Pt denies any weakness. Pt reports head feels heavy and is nauseated. Dr. Wilson Singer aware urgent care sent pt over.

## 2015-03-02 NOTE — ED Provider Notes (Signed)
CSN: [id number]     Arrival date & time 03/02/15  1552 History   First MD Initiated Contact with Patient 03/02/15 1559     Chief Complaint  Patient presents with  . Headache     (Consider location/radiation/quality/duration/timing/severity/associated sxs/prior Treatment) HPI   69 year old female with headache. Acute onset around 1300 today she was getting into her car. Began with "roaring like drums" in her right ear and sharp pain in right temporal region. Felt like a "bee sting." Roaring in right ear subsided over a couple seconds. Denies vertigo. No presyncopal symptoms. She's continued to have intermittent sharp pain in her right head since then though. Says he would nausea and vomiting. No acute visual changes. Acute numbness, tingling or loss of strength. No neck pain or stiffness. No fevers or chills. Denies any recent trauma. No blood thinners.  Past Medical History  Diagnosis Date  . Hypertension   . Gastroesophageal reflux   . Spinal stenosis   . Carpal tunnel syndrome, bilateral   . AC (acromioclavicular) joint bone spurs     lt shoulder  . Arthritis   . Shingles    Past Surgical History  Procedure Laterality Date  . Cholecystectomy    . Colonoscopy  06/11/2012    Procedure: COLONOSCOPY;  Surgeon: Jamesetta So, MD;  Location: [facility];  Service: Gastroenterology;  Laterality: N/A;  . Hysteroscopy w/d&c N/A 02/25/2014    Procedure: DILATATION AND CURETTAGE /HYSTEROSCOPY;  Surgeon: Florian Buff, MD;  Location: [facility];  Service: Gynecology;  Laterality: N/A;  . Polypectomy N/A 02/25/2014    Procedure: POLYPECTOMY;  Surgeon: Florian Buff, MD;  Location: [facility];  Service: Gynecology;  Laterality: N/A;   No family history on file. History  Substance Use Topics  . Smoking status: Never Smoker   . Smokeless tobacco: Never Used  . Alcohol Use: No   OB History    No data available     Review of Systems  All systems reviewed and negative, other than as noted in  HPI.   Allergies  Sulfa antibiotics and Penicillins  Home Medications   Prior to Admission medications   Medication Sig Start Date End Date Taking? Authorizing Provider  diclofenac (VOLTAREN) 75 MG EC tablet Take 75 mg by mouth 2 (two) times daily.    Historical Provider, MD  gabapentin (NEURONTIN) 100 MG capsule Take 200 mg by mouth 3 (three) times daily.     Historical Provider, MD  HYDROcodone-acetaminophen (NORCO/VICODIN) 5-325 MG per tablet Take 1 tablet by mouth every 6 (six) hours as needed for moderate pain.    Historical Provider, MD  medroxyPROGESTERone (PROVERA) 2.5 MG tablet Take 1 tablet (2.5 mg total) by mouth daily. 03/05/14   Florian Buff, MD  ondansetron (ZOFRAN) 8 MG tablet Take 1 tablet (8 mg total) by mouth every 8 (eight) hours as needed for nausea. 02/25/14   Florian Buff, MD  OVER THE COUNTER MEDICATION Take 1 capsule by mouth daily. Capsule to help with muscle soreness.    Historical Provider, MD  pantoprazole (PROTONIX) 40 MG tablet Take 40 mg by mouth daily.  08/20/13   Milton Ferguson, MD  traMADol (ULTRAM) 50 MG tablet Take 50 mg by mouth every 6 (six) hours as needed for pain.    Historical Provider, MD  valsartan-hydrochlorothiazide (DIOVAN-HCT) 160-25 MG per tablet Take 1 tablet by mouth daily.    Historical Provider, MD  Vitamin D, Ergocalciferol, (DRISDOL) 50000 UNITS CAPS capsule Take 50,000 Units by  mouth every 7 (seven) days. Takes on Wednesday    Historical Provider, MD   BP 139/81 mmHg  Pulse 77  Temp(Src) 97.9 F (36.6 C) (Oral)  Resp 18  SpO2 97% Physical Exam  Constitutional: She is oriented to person, place, and time. She appears well-developed and well-nourished. No distress.  HENT:  Head: Normocephalic and atraumatic.  No scalp tenderness  Eyes: Conjunctivae are normal. Pupils are equal, round, and reactive to light. Right eye exhibits no discharge. Left eye exhibits no discharge.  Neck: Neck supple.  No nuchal rigidity  Cardiovascular:  Normal rate, regular rhythm and normal heart sounds.  Exam reveals no gallop and no friction rub.   No murmur heard. Pulmonary/Chest: Effort normal and breath sounds normal. No respiratory distress.  Abdominal: Soft. She exhibits no distension. There is no tenderness.  Musculoskeletal: She exhibits no edema or tenderness.  Neurological: She is alert and oriented to person, place, and time. No cranial nerve deficit. She exhibits normal muscle tone. Coordination normal.  Speech clear. Content appropriate. Follows commands. Cranial nerves intact bilaterally. Strength is normal in bilateral upper and lower extremities. Normal finger to nose testing bilaterally.  Skin: Skin is warm and dry.  Psychiatric: She has a normal mood and affect. Her behavior is normal. Thought content normal.  Nursing note and vitals reviewed.   ED Course  Procedures (including critical care time) Labs Review Labs Reviewed - No data to display  Imaging Review No results found.   EKG Interpretation None       EKG:  Rhythm: normal sinus Vent. rate 78 BPM PR interval 182 ms QRS duration 107 ms QT/QTc 400/456 ms ST segments: NS ST changes  MDM   Final diagnoses:  None    69 year old female with acute onset of headache. Nonfocal neurological examination. No trauma. No blood thinners. Headache onset around 1:00 today. Sensitivity of CT for her subarachnoid hemorrhage is good within the first few hours. Will treat symptoms and reassess.  CT head does not show any acute abnormality. Repeat neuro exam remains nonfocal.  Suspect primary HA. Consider emergent secondary causes such as bleed, infectious or mass but doubt. There is no history of trauma. Pt has a nonfocal neurological exam. Afebrile and neck supple. No use of blood thinning medication. Consider ocular etiology such as acute angle closure glaucoma but doubt. Pt denies acute change in visual acuity and eye exam unremarkable. Doubt temporal arteritis.  Doubt CO poisoning. No contacts with similar symptoms and typically see this more in colder months. Doubt venous thrombosis. Doubt carotid or vertebral arteries dissection. Symptoms improved with meds. Feel that can be safely discharged, but strict return precautions discussed. Outpt fu.     Virgel Manifold, MD 03/02/15 [phone]

## 2015-03-02 NOTE — ED Notes (Signed)
MD at bedside. 

## 2015-03-02 NOTE — Discharge Instructions (Signed)

## 2015-03-09 NOTE — H&P (Signed)
Nicole [name] is an 69 y.o. female.    Chief Complaint: right shoulder pain  HPI: Pt is a 69 y.o. female complaining of right shoulder pain for multiple years. Pain had continually increased since the beginning. X-rays in the clinic show end-stage arthritic changes of the right AC joint. Pt has tried various conservative treatments which have failed to alleviate their symptoms, including injections and therapy. Various options are discussed with the patient. Risks, benefits and expectations were discussed with the patient. Patient understand the risks, benefits and expectations and wishes to proceed with surgery.   PCP:  Leonides Grills, MD  D/C Plans: Home  PMH: Past Medical History  Diagnosis Date  . Hypertension   . Gastroesophageal reflux   . Spinal stenosis   . Carpal tunnel syndrome, bilateral   . AC (acromioclavicular) joint bone spurs     lt shoulder  . Arthritis   . Shingles     PSH: Past Surgical History  Procedure Laterality Date  . Cholecystectomy    . Colonoscopy  06/11/2012    Procedure: COLONOSCOPY;  Surgeon: Jamesetta So, MD;  [facility];  Service: Gastroenterology;  Laterality: N/A;  . Hysteroscopy w/d&c N/A 02/25/2014    Procedure: DILATATION AND CURETTAGE /HYSTEROSCOPY;  Surgeon: Florian Buff, MD;  Location: [facility];  Service: Gynecology;  Laterality: N/A;  . Polypectomy N/A 02/25/2014    Procedure: POLYPECTOMY;  Surgeon: Florian Buff, MD;  Location: [facility];  Service: Gynecology;  Laterality: N/A;    Social History:  reports that she has never smoked. She has never used smokeless tobacco. She reports that she does not drink alcohol or use illicit drugs.  Allergies:  Allergies  Allergen Reactions  . Sulfa Antibiotics Other (See Comments)    Sores  . Penicillins Rash    Medications: No current facility-administered medications for this encounter.   Current Outpatient Prescriptions  Medication Sig Dispense Refill  . diclofenac (VOLTAREN)  75 MG EC tablet Take 75 mg by mouth 2 (two) times daily.    [name] gabapentin (NEURONTIN) 100 MG capsule Take 200 mg by mouth 3 (three) times daily.     [name] HYDROcodone-acetaminophen (NORCO/VICODIN) 5-325 MG per tablet Take 1 tablet by mouth every 6 (six) hours as needed for moderate pain.    . medroxyPROGESTERone (PROVERA) 2.5 MG tablet Take 1 tablet (2.5 mg total) by mouth daily. 30 tablet 11  . ondansetron (ZOFRAN) 8 MG tablet Take 1 tablet (8 mg total) by mouth every 8 (eight) hours as needed for nausea. 24 tablet 1  . OVER THE COUNTER MEDICATION Take 1 capsule by mouth daily. Capsule to help with muscle soreness.    . pantoprazole (PROTONIX) 40 MG tablet Take 40 mg by mouth daily.     . traMADol (ULTRAM) 50 MG tablet Take 50 mg by mouth every 6 (six) hours as needed for pain.    . valsartan-hydrochlorothiazide (DIOVAN-HCT) 160-25 MG per tablet Take 1 tablet by mouth daily.    . Vitamin D, Ergocalciferol, (DRISDOL) 50000 UNITS CAPS capsule Take 50,000 Units by mouth every 7 (seven) days. Takes on Wednesday      No results found for this or any previous visit (from the past 63 hour(s)). No results found.  ROS: Pain with rom of the right upper extremity  Physical Exam:  Alert and oriented 69 y.o. female in no acute distress Cranial nerves 2-12 intact Cervical spine: full rom with no tenderness, nv intact distally Chest: active breath sounds bilaterally, no  wheeze rhonchi or rales Heart: regular rate and rhythm, no murmur Abd: non tender non distended with active bowel sounds Hip is stable with rom  Mild edema/effusion to right AC joint Pain with rom nv intact distally No rashes or edema  Assessment/Plan Assessment: right AC joint arthrosis  Plan: Patient will undergo a right shoulder open distal clavicle excision by Dr. Veverly Fells at [facility]. Risks benefits and expectations were discussed with the patient. Patient understand risks, benefits and expectations and wishes to  proceed.

## 2015-03-17 ENCOUNTER — Other Ambulatory Visit (HOSPITAL_COMMUNITY): Payer: Self-pay | Admitting: *Deleted

## 2015-03-17 NOTE — Pre-Procedure Instructions (Addendum)
    Nicole [name]  03/17/2015      [facility] - [place], [place] - [id number] [place] [address] Phone: [phone] Fax: [phone]  [place], [place] [address] Phone: [phone] Fax: [phone]    Your procedure is scheduled on Friday, March 26, 2015 at 10:30 AM.   Report to [facility] Entrance "A" Admitting Office at 8:30 AM.   Call this number if you have problems the morning of surgery: [phone]              Any questions prior to day of surgery, please call [phone] between 8 & 4 PM.     Remember:  Do not eat food or drink liquids after midnight Thursday, 03/25/15.  Take these medicines the morning of surgery with A SIP OF WATER:gabapentin,hydrocodone if needed,protonix   Do not wear jewelry, make-up or nail polish.  Do not wear lotions, powders, or perfumes.    Do not shave 48 hours prior to surgery.    Do not bring valuables to the hospital.  [facility] is not responsible for any belongings or valuables.  Contacts, dentures or bridgework may not be worn into surgery.  Leave your suitcase in the car.  After surgery it may be brought to your room.  For patients admitted to the hospital, discharge time will be determined by your treatment team.  Patients discharged the day of surgery will not be allowed to drive home.   Special instructions:  See "Preparing for Surgery" Instruction sheet.   Please read over the following fact sheets that you were given. Pain Booklet, Coughing and Deep Breathing and Surgical Site Infection Prevention

## 2015-03-18 ENCOUNTER — Encounter (HOSPITAL_COMMUNITY)
Admission: RE | Admit: 2015-03-18 | Discharge: 2015-03-18 | Disposition: A | Discharge status: Home / Self Care | Payer: Medicare Other | Source: Ambulatory Visit | Attending: Orthopedic Surgery | Admitting: Orthopedic Surgery

## 2015-03-18 ENCOUNTER — Encounter (HOSPITAL_COMMUNITY): Payer: Self-pay

## 2015-03-18 DIAGNOSIS — M25811 Other specified joint disorders, right shoulder: Secondary | ICD-10-CM | POA: Diagnosis not present

## 2015-03-18 DIAGNOSIS — Z01812 Encounter for preprocedural laboratory examination: Secondary | ICD-10-CM | POA: Insufficient documentation

## 2015-03-18 HISTORY — DX: Anemia, unspecified: D64.9

## 2015-03-18 HISTORY — DX: Headache: R51

## 2015-03-18 HISTORY — DX: Headache, unspecified: R51.9

## 2015-03-18 LAB — BASIC METABOLIC PANEL
Anion gap: 9 (ref 5–15)
BUN: 21 mg/dL — ABNORMAL HIGH (ref 6–20)
CO2: 26 mmol/L (ref 22–32)
Calcium: 9.3 mg/dL (ref 8.9–10.3)
Chloride: 106 mmol/L (ref 101–111)
Creatinine, Ser: 1.13 mg/dL — ABNORMAL HIGH (ref 0.44–1.00)
GFR calc Af Amer: 57 mL/min — ABNORMAL LOW (ref >60)
GFR calc non Af Amer: 49 mL/min — ABNORMAL LOW (ref >60)
Glucose, Bld: 117 mg/dL — ABNORMAL HIGH (ref 65–99)
Potassium: 4.1 mmol/L (ref 3.5–5.1)
Sodium: 141 mmol/L (ref 135–145)

## 2015-03-18 LAB — CBC
HCT: 36 % (ref 36.0–46.0)
Hemoglobin: 11.9 g/dL — ABNORMAL LOW (ref 12.0–15.0)
MCH: 29.4 pg (ref 26.0–34.0)
MCHC: 33.1 g/dL (ref 30.0–36.0)
MCV: 88.9 fL (ref 78.0–100.0)
Platelets: 293 10*3/uL (ref 150–400)
RBC: 4.05 MIL/uL (ref 3.87–5.11)
RDW: 14.5 % (ref 11.5–15.5)
WBC: 7.4 10*3/uL (ref 4.0–10.5)

## 2015-03-18 NOTE — Progress Notes (Signed)
Pt. Denies any heart problems,chest pain or any type of cardiac testing.

## 2015-03-19 DIAGNOSIS — N342 Other urethritis: Secondary | ICD-10-CM | POA: Diagnosis not present

## 2015-03-19 DIAGNOSIS — Z6841 Body Mass Index (BMI) 40.0 and over, adult: Secondary | ICD-10-CM | POA: Diagnosis not present

## 2015-03-19 DIAGNOSIS — R310 Gross hematuria: Secondary | ICD-10-CM | POA: Diagnosis not present

## 2015-03-24 DIAGNOSIS — R809 Proteinuria, unspecified: Secondary | ICD-10-CM | POA: Diagnosis not present

## 2015-03-24 DIAGNOSIS — Z6841 Body Mass Index (BMI) 40.0 and over, adult: Secondary | ICD-10-CM | POA: Diagnosis not present

## 2015-03-25 MED ORDER — CLINDAMYCIN PHOSPHATE 900 MG/50ML IV SOLN
900.0000 mg | INTRAVENOUS | Status: AC
  Administered 2015-03-26: 900 mg via INTRAVENOUS
  Filled 2015-03-25: qty 50

## 2015-03-26 ENCOUNTER — Ambulatory Visit (HOSPITAL_COMMUNITY): Payer: Medicare Other | Admitting: Certified Registered Nurse Anesthetist

## 2015-03-26 ENCOUNTER — Encounter (HOSPITAL_COMMUNITY)
Admission: RE | Disposition: A | Discharge status: Home / Self Care | Payer: Self-pay | Source: Ambulatory Visit | Attending: Orthopedic Surgery

## 2015-03-26 ENCOUNTER — Encounter (HOSPITAL_COMMUNITY): Payer: Self-pay | Admitting: Certified Registered Nurse Anesthetist

## 2015-03-26 ENCOUNTER — Ambulatory Visit (HOSPITAL_COMMUNITY)
Admission: RE | Admit: 2015-03-26 | Discharge: 2015-03-26 | Disposition: A | Discharge status: Home / Self Care | Payer: Medicare Other | Source: Ambulatory Visit | Attending: Orthopedic Surgery | Admitting: Orthopedic Surgery

## 2015-03-26 DIAGNOSIS — M19011 Primary osteoarthritis, right shoulder: Secondary | ICD-10-CM | POA: Diagnosis not present

## 2015-03-26 DIAGNOSIS — Z888 Allergy status to other drugs, medicaments and biological substances status: Secondary | ICD-10-CM | POA: Diagnosis not present

## 2015-03-26 DIAGNOSIS — Z9049 Acquired absence of other specified parts of digestive tract: Secondary | ICD-10-CM | POA: Diagnosis not present

## 2015-03-26 DIAGNOSIS — Z6841 Body Mass Index (BMI) 40.0 and over, adult: Secondary | ICD-10-CM | POA: Insufficient documentation

## 2015-03-26 DIAGNOSIS — D649 Anemia, unspecified: Secondary | ICD-10-CM | POA: Diagnosis not present

## 2015-03-26 DIAGNOSIS — K219 Gastro-esophageal reflux disease without esophagitis: Secondary | ICD-10-CM | POA: Diagnosis not present

## 2015-03-26 DIAGNOSIS — M67411 Ganglion, right shoulder: Secondary | ICD-10-CM | POA: Diagnosis not present

## 2015-03-26 DIAGNOSIS — M00811 Arthritis due to other bacteria, right shoulder: Secondary | ICD-10-CM | POA: Diagnosis not present

## 2015-03-26 DIAGNOSIS — Z880 Allergy status to penicillin: Secondary | ICD-10-CM | POA: Diagnosis not present

## 2015-03-26 DIAGNOSIS — I10 Essential (primary) hypertension: Secondary | ICD-10-CM | POA: Insufficient documentation

## 2015-03-26 DIAGNOSIS — M7989 Other specified soft tissue disorders: Secondary | ICD-10-CM | POA: Diagnosis not present

## 2015-03-26 DIAGNOSIS — Z882 Allergy status to sulfonamides status: Secondary | ICD-10-CM | POA: Insufficient documentation

## 2015-03-26 DIAGNOSIS — M13811 Other specified arthritis, right shoulder: Secondary | ICD-10-CM | POA: Insufficient documentation

## 2015-03-26 DIAGNOSIS — D2111 Benign neoplasm of connective and other soft tissue of right upper limb, including shoulder: Secondary | ICD-10-CM | POA: Diagnosis not present

## 2015-03-26 HISTORY — PX: RESECTION DISTAL CLAVICAL: SHX5053

## 2015-03-26 SURGERY — EXCISION, CLAVICLE, DISTAL, OPEN
Anesthesia: Regional | Site: Shoulder | Laterality: Right

## 2015-03-26 MED ORDER — PHENYLEPHRINE HCL 10 MG/ML IJ SOLN
10.0000 mg | INTRAVENOUS | Status: DC | PRN
  Administered 2015-03-26: 20 ug/min via INTRAVENOUS

## 2015-03-26 MED ORDER — LACTATED RINGERS IV SOLN
INTRAVENOUS | Status: DC
  Administered 2015-03-26: 11:00:00 via INTRAVENOUS

## 2015-03-26 MED ORDER — BUPIVACAINE-EPINEPHRINE 0.5% -1:200000 IJ SOLN
INTRAMUSCULAR | Status: DC | PRN
  Administered 2015-03-26: 20 mL

## 2015-03-26 MED ORDER — MEPERIDINE HCL 25 MG/ML IJ SOLN: 6.2500 mg | INTRAMUSCULAR | Status: DC | PRN

## 2015-03-26 MED ORDER — OXYCODONE-ACETAMINOPHEN 5-325 MG PO TABS
1.0000 | ORAL_TABLET | Freq: Once | ORAL | Status: AC
  Administered 2015-03-26: 2 via ORAL

## 2015-03-26 MED ORDER — PROMETHAZINE HCL 25 MG/ML IJ SOLN
INTRAMUSCULAR | Status: AC
  Filled 2015-03-26: qty 1

## 2015-03-26 MED ORDER — HYDROMORPHONE HCL 1 MG/ML IJ SOLN
INTRAMUSCULAR | Status: AC
  Filled 2015-03-26: qty 1

## 2015-03-26 MED ORDER — FENTANYL CITRATE (PF) 250 MCG/5ML IJ SOLN
INTRAMUSCULAR | Status: AC
  Filled 2015-03-26: qty 5

## 2015-03-26 MED ORDER — ONDANSETRON HCL 4 MG/2ML IJ SOLN
INTRAMUSCULAR | Status: DC | PRN
  Administered 2015-03-26: 4 mg via INTRAVENOUS

## 2015-03-26 MED ORDER — DEXAMETHASONE SODIUM PHOSPHATE 4 MG/ML IJ SOLN
INTRAMUSCULAR | Status: DC | PRN
  Administered 2015-03-26: 4 mg via INTRAVENOUS

## 2015-03-26 MED ORDER — FENTANYL CITRATE (PF) 100 MCG/2ML IJ SOLN
INTRAMUSCULAR | Status: DC | PRN
  Administered 2015-03-26: 75 ug via INTRAVENOUS
  Administered 2015-03-26 (×2): 25 ug via INTRAVENOUS
  Administered 2015-03-26: 50 ug via INTRAVENOUS
  Administered 2015-03-26: 25 ug via INTRAVENOUS
  Administered 2015-03-26: 50 ug via INTRAVENOUS

## 2015-03-26 MED ORDER — LIDOCAINE HCL (CARDIAC) 20 MG/ML IV SOLN
INTRAVENOUS | Status: DC | PRN
  Administered 2015-03-26: 50 mg via INTRAVENOUS

## 2015-03-26 MED ORDER — SUCCINYLCHOLINE CHLORIDE 20 MG/ML IJ SOLN
INTRAMUSCULAR | Status: DC | PRN
  Administered 2015-03-26: 100 mg via INTRAVENOUS

## 2015-03-26 MED ORDER — MIDAZOLAM HCL 2 MG/2ML IJ SOLN
INTRAMUSCULAR | Status: AC
  Filled 2015-03-26: qty 2

## 2015-03-26 MED ORDER — BUPIVACAINE-EPINEPHRINE (PF) 0.25% -1:200000 IJ SOLN
INTRAMUSCULAR | Status: AC
  Filled 2015-03-26: qty 30

## 2015-03-26 MED ORDER — KETOROLAC TROMETHAMINE 30 MG/ML IJ SOLN
30.0000 mg | Freq: Once | INTRAMUSCULAR | Status: AC | PRN
  Administered 2015-03-26: 30 mg via INTRAVENOUS

## 2015-03-26 MED ORDER — CHLORHEXIDINE GLUCONATE 4 % EX LIQD: 60.0000 mL | Freq: Once | CUTANEOUS | Status: DC

## 2015-03-26 MED ORDER — ONDANSETRON HCL 4 MG/2ML IJ SOLN
INTRAMUSCULAR | Status: AC
  Filled 2015-03-26: qty 2

## 2015-03-26 MED ORDER — HYDROMORPHONE HCL 1 MG/ML IJ SOLN
0.2500 mg | INTRAMUSCULAR | Status: DC | PRN
  Administered 2015-03-26: 1 mg via INTRAVENOUS

## 2015-03-26 MED ORDER — PROPOFOL 10 MG/ML IV BOLUS
INTRAVENOUS | Status: AC
  Filled 2015-03-26: qty 20

## 2015-03-26 MED ORDER — PROMETHAZINE HCL 25 MG/ML IJ SOLN
6.2500 mg | INTRAMUSCULAR | Status: DC | PRN
  Administered 2015-03-26: 6.25 mg via INTRAVENOUS

## 2015-03-26 MED ORDER — LIDOCAINE HCL (CARDIAC) 20 MG/ML IV SOLN
INTRAVENOUS | Status: AC
  Filled 2015-03-26: qty 5

## 2015-03-26 MED ORDER — LACTATED RINGERS IV SOLN
INTRAVENOUS | Status: DC
  Administered 2015-03-26: 09:00:00 via INTRAVENOUS

## 2015-03-26 MED ORDER — PROPOFOL 10 MG/ML IV BOLUS
INTRAVENOUS | Status: DC | PRN
  Administered 2015-03-26: 200 mg via INTRAVENOUS

## 2015-03-26 MED ORDER — KETOROLAC TROMETHAMINE 30 MG/ML IJ SOLN
INTRAMUSCULAR | Status: AC
  Filled 2015-03-26: qty 1

## 2015-03-26 MED ORDER — OXYCODONE-ACETAMINOPHEN 5-325 MG PO TABS
ORAL_TABLET | ORAL | Status: AC
  Filled 2015-03-26: qty 2

## 2015-03-26 MED ORDER — MIDAZOLAM HCL 5 MG/5ML IJ SOLN
INTRAMUSCULAR | Status: DC | PRN
  Administered 2015-03-26 (×2): 0.5 mg via INTRAVENOUS
  Administered 2015-03-26: 2 mg via INTRAVENOUS

## 2015-03-26 MED ORDER — OXYCODONE-ACETAMINOPHEN 5-325 MG PO TABS: 1.0000 | ORAL_TABLET | ORAL | Status: DC | PRN

## 2015-03-26 SURGICAL SUPPLY — 56 items
BIT DRILL 7/64X5 DISP (BIT) IMPLANT
BLADE AVERAGE 25X9 (BLADE) ×2 IMPLANT
CLEANER TIP ELECTROSURG 2X2 (MISCELLANEOUS) ×2 IMPLANT
CLSR STERI-STRIP ANTIMIC 1/2X4 (GAUZE/BANDAGES/DRESSINGS) ×1 IMPLANT
CONT SPECI 4OZ STER CLIK (MISCELLANEOUS) ×1 IMPLANT
COVER SURGICAL LIGHT HANDLE (MISCELLANEOUS) ×2 IMPLANT
DECANTER SPIKE VIAL GLASS SM (MISCELLANEOUS) IMPLANT
DRAPE IMP U-DRAPE 54X76 (DRAPES) ×2 IMPLANT
DRAPE INCISE IOBAN 66X45 STRL (DRAPES) ×2 IMPLANT
DRAPE ORTHO SPLIT 77X108 STRL (DRAPES) ×2
DRAPE STERI 35X30 U-POUCH (DRAPES) ×2 IMPLANT
DRAPE SURG ORHT 6 SPLT 77X108 (DRAPES) ×1 IMPLANT
DRAPE U-SHAPE 47X51 STRL (DRAPES) ×2 IMPLANT
DRSG ADAPTIC 3X8 NADH LF (GAUZE/BANDAGES/DRESSINGS) ×1 IMPLANT
DRSG EMULSION OIL 3X3 NADH (GAUZE/BANDAGES/DRESSINGS) ×2 IMPLANT
DRSG PAD ABDOMINAL 8X10 ST (GAUZE/BANDAGES/DRESSINGS) ×3 IMPLANT
DURAPREP 26ML APPLICATOR (WOUND CARE) ×2 IMPLANT
ELECT REM PT RETURN 9FT ADLT (ELECTROSURGICAL) ×2
ELECTRODE REM PT RTRN 9FT ADLT (ELECTROSURGICAL) ×1 IMPLANT
FACESHIELD WRAPAROUND (MASK) ×4 IMPLANT
FACESHIELD WRAPAROUND OR TEAM (MASK) ×2 IMPLANT
GAUZE SPONGE 4X4 12PLY STRL (GAUZE/BANDAGES/DRESSINGS) ×2 IMPLANT
GLOVE BIOGEL PI ORTHO PRO 7.5 (GLOVE) ×1
GLOVE BIOGEL PI ORTHO PRO SZ8 (GLOVE) ×1
GLOVE ORTHO TXT STRL SZ7.5 (GLOVE) ×2 IMPLANT
GLOVE PI ORTHO PRO STRL 7.5 (GLOVE) ×1 IMPLANT
GLOVE PI ORTHO PRO STRL SZ8 (GLOVE) ×1 IMPLANT
GLOVE SURG ORTHO 8.5 STRL (GLOVE) ×2 IMPLANT
GOWN STRL REUS W/ TWL LRG LVL3 (GOWN DISPOSABLE) ×1 IMPLANT
GOWN STRL REUS W/ TWL XL LVL3 (GOWN DISPOSABLE) ×2 IMPLANT
GOWN STRL REUS W/TWL LRG LVL3 (GOWN DISPOSABLE) ×2
GOWN STRL REUS W/TWL XL LVL3 (GOWN DISPOSABLE) ×4
KIT BASIN OR (CUSTOM PROCEDURE TRAY) ×2 IMPLANT
KIT ROOM TURNOVER OR (KITS) ×2 IMPLANT
MANIFOLD NEPTUNE II (INSTRUMENTS) ×2 IMPLANT
NDL HYPO 25GX1X1/2 BEV (NEEDLE) ×1 IMPLANT
NDL SUT .5 MAYO 1.404X.05X (NEEDLE) ×1 IMPLANT
NDL SUT 6 .5 CRC .975X.05 MAYO (NEEDLE) ×1 IMPLANT
NEEDLE HYPO 25GX1X1/2 BEV (NEEDLE) ×2 IMPLANT
NEEDLE MAYO TAPER (NEEDLE) ×4
NS IRRIG 1000ML POUR BTL (IV SOLUTION) ×2 IMPLANT
PACK SHOULDER (CUSTOM PROCEDURE TRAY) ×2 IMPLANT
PACK UNIVERSAL I (CUSTOM PROCEDURE TRAY) ×2 IMPLANT
PAD ARMBOARD 7.5X6 YLW CONV (MISCELLANEOUS) ×4 IMPLANT
SLING ARM FOAM STRAP LRG (SOFTGOODS) ×1 IMPLANT
SPONGE LAP 4X18 X RAY DECT (DISPOSABLE) ×4 IMPLANT
STRIP CLOSURE SKIN 1/2X4 (GAUZE/BANDAGES/DRESSINGS) ×2 IMPLANT
SUT MNCRL AB 3-0 PS2 18 (SUTURE) ×2 IMPLANT
SUT VIC AB 2-0 CT1 27 (SUTURE) ×2
SUT VIC AB 2-0 CT1 TAPERPNT 27 (SUTURE) ×1 IMPLANT
SYR CONTROL 10ML LL (SYRINGE) ×2 IMPLANT
TAPE CLOTH SURG 6X10 WHT LF (GAUZE/BANDAGES/DRESSINGS) ×1 IMPLANT
TOWEL OR 17X24 6PK STRL BLUE (TOWEL DISPOSABLE) ×2 IMPLANT
TOWEL OR 17X26 10 PK STRL BLUE (TOWEL DISPOSABLE) ×2 IMPLANT
WATER STERILE IRR 1000ML POUR (IV SOLUTION) ×2 IMPLANT
YANKAUER SUCT BULB TIP NO VENT (SUCTIONS) IMPLANT

## 2015-03-26 NOTE — Anesthesia Preprocedure Evaluation (Addendum)
Anesthesia Evaluation  Patient identified by MRN, date of birth, ID band Patient awake    Reviewed: Allergy & Precautions, H&P , NPO status , Patient's Chart, lab work & pertinent test results  History of Anesthesia Complications Negative for: history of anesthetic complications  Airway Mallampati: III  TM Distance: >3 FB Neck ROM: Full    Dental  (+) Edentulous Upper, Dental Advisory Given   Pulmonary neg pulmonary ROS,  breath sounds clear to auscultation        Cardiovascular hypertension, Pt. on medications Rhythm:Regular Rate:Normal     Neuro/Psych  Headaches,  Neuromuscular disease negative psych ROS   GI/Hepatic Neg liver ROS, GERD-  Medicated and Controlled,  Endo/Other  Morbid obesity  Renal/GU negative Renal ROS     Musculoskeletal  (+) Arthritis -,   Abdominal   Peds  Hematology negative hematology ROS (+) anemia ,   Anesthesia Other Findings   Reproductive/Obstetrics negative OB ROS                            Anesthesia Physical  Anesthesia Plan  ASA: III  Anesthesia Plan: General and Regional   Post-op Pain Management:    Induction: Intravenous  Airway Management Planned: Oral ETT  Additional Equipment:   Intra-op Plan:   Post-operative Plan: Extubation in OR  Informed Consent: I have reviewed the patients History and Physical, chart, labs and discussed the procedure including the risks, benefits and alternatives for the proposed anesthesia with the patient or authorized representative who has indicated his/her understanding and acceptance.   Dental advisory given  Plan Discussed with: CRNA  Anesthesia Plan Comments:         Anesthesia Quick Evaluation

## 2015-03-26 NOTE — Op Note (Signed)
NAMEJAHZARAH, LEFEVER                ACCOUNT NO.:  [id number]  MEDICAL RECORD NO.:  [id number]  LOCATION:  [facility]                         FACILITY:  [facility]  PHYSICIAN:  Doran Heater. Veverly Fells, M.D. DATE OF BIRTH:  06-18-1946  DATE OF PROCEDURE:  03/26/2015 DATE OF DISCHARGE:  03/26/2015                              OPERATIVE REPORT   PREOPERATIVE DIAGNOSIS:  Right shoulder acromioclavicular joint arthritis with large ganglion cyst, symptomatic.  POSTOPERATIVE DIAGNOSIS:  Right shoulder acromioclavicular joint arthritis with large ganglion cyst, symptomatic.  PROCEDURE PERFORMED:  Right shoulder open excisional biopsy of large periarticular acromioclavicular joint cyst with open distal clavicle resection.  ATTENDING SURGEON:  Doran Heater. Veverly Fells, M.D.  ASSISTANT:  Abbott Pao. Dixon, P.A.-C, who was scrubbed during the entire procedure and necessary for satisfactory completion of surgery.  ANESTHESIA:  General anesthesia was used plus local.  ESTIMATED BLOOD LOSS:  Minimal.  FLUID REPLACEMENT:  1000 mL crystalloid.  INSTRUMENT COUNTS:  Correct.  COMPLICATIONS:  There were no complications.  ANTIBIOTICS:  Perioperative antibiotics were given.  INDICATIONS:  The patient is a 69 year old female with worsening right shoulder pain and a large perhaps 4 x 6 cm suspected ganglion cyst on her dorsal shoulder.  She has had progressive pain despite conservative management.  The patient does have glenohumeral arthritis, but just wants the cyst removed.  We talked about excisional biopsy, removing it and sending it for pathology and also removing the distal clavicle for distal clavicle decompression, so that she would not reform this cyst. Risks and benefits of surgery were discussed.  Informed consent was obtained.  DESCRIPTION OF PROCEDURE:  After an adequate level of anesthesia achieved, the patient was positioned in modified beach-chair position. Right shoulder was correctly identified and  sterilely prepped and draped in usual manner.  Time-out was called.  Longitudinal saber incision was created overlying the cyst, which was easily visible, which was about a 4 x 6 cm cyst of the dorsal AC joint area.  Dissection down through the subcutaneous tissues.  We dissected carefully around the cyst wall, removed it in its entirety from the dorsal aspect of the [facility] joint.  We then removed the distal 2-3 mm of distal clavicle, which was arthritic with an oscillating saw.  We thoroughly irrigated the AC interval, applied bone wax to the cut into the clavicle and then repaired the deltotrapezial fascia as best as we could with 0 Vicryl suture, although it was quite attenuated over the dorsal area where the cyst was.  Thus, we get a nearly complete deltotrapezial repair.  We then repaired the subcutaneous closure with 2-0 Vicryl suture for a solid compressed repair and then a running 4-0 Monocryl for skin.  Steri-Strips applied followed by sterile dressing.  The patient tolerated the surgery well.     Doran Heater. Veverly Fells, M.D.     SRN/MEDQ  D:  03/26/2015  T:  03/26/2015  Job:  [id number]

## 2015-03-26 NOTE — Brief Op Note (Signed)
03/26/2015  12:03 PM  PATIENT:  Nicole [name]  69 y.o. female  PRE-OPERATIVE DIAGNOSIS:  RIGHT AC JOINT ARTHRITIS AND CYST  POST-OPERATIVE DIAGNOSIS:  RIGHT AC JOINT ARTHRITIS AND  CYST  PROCEDURE:  Procedure(s): OPEN DISTAL CLAVICAL RESECTION  (Right)  SURGEON:  Surgeon(s) and Role:    * Netta Cedars, MD - Primary  PHYSICIAN ASSISTANT:   ASSISTANTS: Ventura Bruns, PA-C   ANESTHESIA:   general  EBL:     BLOOD ADMINISTERED:none  DRAINS: none   LOCAL MEDICATIONS USED:  MARCAINE     SPECIMEN:  Source of Specimen:  right shoulder cyst  DISPOSITION OF SPECIMEN:  PATHOLOGY  COUNTS:  YES  TOURNIQUET:  * No tourniquets in log *  DICTATION: .Other Dictation: Dictation Number [id number]  PLAN OF CARE: Discharge to home after PACU  PATIENT DISPOSITION:  PACU - hemodynamically stable.   Delay start of Pharmacological VTE agent (>24hrs) due to surgical blood loss or risk of bleeding: no

## 2015-03-26 NOTE — Interval H&P Note (Signed)
History and Physical Interval Note:  03/26/2015 10:18 AM  Nicole [name]  has presented today for surgery, with the diagnosis of RIGHT AC JOINT CYST  The various methods of treatment have been discussed with the patient and family. After consideration of risks, benefits and other options for treatment, the patient has consented to  Procedure(s): OPEN DISTAL CLAVICAL RESECTION  (Right) as a surgical intervention .  The patient's history has been reviewed, patient examined, no change in status, stable for surgery.  I have reviewed the patient's chart and labs.  Questions were answered to the patient's satisfaction.     Zadin Lange,STEVEN R

## 2015-03-26 NOTE — Anesthesia Postprocedure Evaluation (Signed)
Anesthesia Post Note  Patient: Nicole [name]  Procedure(s) Performed: Procedure(s) (LRB): OPEN DISTAL CLAVICAL RESECTION  (Right)  Anesthesia type: General  Patient location: PACU  Post pain: Pain level controlled  Post assessment: Post-op Vital signs reviewed  Last Vitals: BP 112/95 mmHg  Pulse 85  Temp(Src) 36.6 C (Oral)  Resp 16  Ht 5' 0.5" (1.537 m)  Wt 273 lb (123.832 kg)  BMI 52.42 kg/m2  SpO2 99%  Post vital signs: Reviewed  Level of consciousness: sedated  Complications: No apparent anesthesia complications

## 2015-03-26 NOTE — Transfer of Care (Signed)
Immediate Anesthesia Transfer of Care Note  Patient: Nicole [name]  Procedure(s) Performed: Procedure(s): OPEN DISTAL CLAVICAL RESECTION  (Right)  Patient Location: PACU  Anesthesia Type:General  Level of Consciousness: awake, alert  and oriented  Airway & Oxygen Therapy: Patient Spontanous Breathing and Patient connected to face mask oxygen  Post-op Assessment: Report given to RN and Post -op Vital signs reviewed and stable  Post vital signs: Reviewed and stable  Last Vitals:  Filed Vitals:   03/26/15 0900  BP: 130/65  Pulse:   Temp:   Resp:     Complications: No apparent anesthesia complications

## 2015-03-26 NOTE — Discharge Instructions (Signed)
Ice to the shoulder as much as possible.  Rest the arm, do not do heavy use of the right arm and shoulder.  USe the sling as much as you can.  Keep the dressing on through the weekend, then change to BandAids on Monday.  OK to get wet in the shower on Wednesday next week if the incision is clean and dry and not leaking.  If leaking fluid, call the office [phone]  Follow up in the office in two weeks  [phone]

## 2015-03-26 NOTE — Anesthesia Procedure Notes (Signed)
Procedure Name: Intubation Date/Time: 03/26/2015 10:57 AM Performed by: Ollen Bowl Pre-anesthesia Checklist: Patient identified, Emergency Drugs available, Suction available, Patient being monitored and Timeout performed Patient Re-evaluated:Patient Re-evaluated prior to inductionOxygen Delivery Method: Circle system utilized and Simple face mask Preoxygenation: Pre-oxygenation with 100% oxygen Intubation Type: IV induction Ventilation: Mask ventilation without difficulty and Oral airway inserted - appropriate to patient size Laryngoscope Size: Mac and 3 Grade View: Grade I Tube type: Oral Tube size: 7.0 mm Number of attempts: 1 Airway Equipment and Method: Patient positioned with wedge pillow and Stylet Placement Confirmation: ETT inserted through vocal cords under direct vision,  positive ETCO2 and breath sounds checked- equal and bilateral Secured at: 21 cm Tube secured with: Tape Dental Injury: Teeth and Oropharynx as per pre-operative assessment

## 2015-03-29 ENCOUNTER — Encounter (HOSPITAL_COMMUNITY): Payer: Self-pay | Admitting: Orthopedic Surgery

## 2015-04-08 DIAGNOSIS — Z4789 Encounter for other orthopedic aftercare: Secondary | ICD-10-CM | POA: Diagnosis not present

## 2015-04-08 DIAGNOSIS — M25511 Pain in right shoulder: Secondary | ICD-10-CM | POA: Diagnosis not present

## 2015-04-24 DIAGNOSIS — R21 Rash and other nonspecific skin eruption: Secondary | ICD-10-CM | POA: Diagnosis not present

## 2015-05-20 DIAGNOSIS — Z4789 Encounter for other orthopedic aftercare: Secondary | ICD-10-CM | POA: Diagnosis not present

## 2015-05-20 DIAGNOSIS — R2231 Localized swelling, mass and lump, right upper limb: Secondary | ICD-10-CM | POA: Diagnosis not present

## 2015-05-26 DIAGNOSIS — Z4789 Encounter for other orthopedic aftercare: Secondary | ICD-10-CM | POA: Diagnosis not present

## 2015-06-01 DIAGNOSIS — M5442 Lumbago with sciatica, left side: Secondary | ICD-10-CM | POA: Diagnosis not present

## 2015-06-17 DIAGNOSIS — M4807 Spinal stenosis, lumbosacral region: Secondary | ICD-10-CM | POA: Diagnosis not present

## 2015-06-17 DIAGNOSIS — G894 Chronic pain syndrome: Secondary | ICD-10-CM | POA: Diagnosis not present

## 2015-06-17 DIAGNOSIS — M5136 Other intervertebral disc degeneration, lumbar region: Secondary | ICD-10-CM | POA: Diagnosis not present

## 2015-07-20 DIAGNOSIS — M1991 Primary osteoarthritis, unspecified site: Secondary | ICD-10-CM | POA: Diagnosis not present

## 2015-07-20 DIAGNOSIS — M5136 Other intervertebral disc degeneration, lumbar region: Secondary | ICD-10-CM | POA: Diagnosis not present

## 2015-07-20 DIAGNOSIS — Z6841 Body Mass Index (BMI) 40.0 and over, adult: Secondary | ICD-10-CM | POA: Diagnosis not present

## 2015-07-20 DIAGNOSIS — Z1389 Encounter for screening for other disorder: Secondary | ICD-10-CM | POA: Diagnosis not present

## 2015-08-02 DIAGNOSIS — Z1389 Encounter for screening for other disorder: Secondary | ICD-10-CM | POA: Diagnosis not present

## 2015-08-02 DIAGNOSIS — Z6841 Body Mass Index (BMI) 40.0 and over, adult: Secondary | ICD-10-CM | POA: Diagnosis not present

## 2015-08-02 DIAGNOSIS — M545 Low back pain: Secondary | ICD-10-CM | POA: Diagnosis not present

## 2015-09-11 DIAGNOSIS — M5136 Other intervertebral disc degeneration, lumbar region: Secondary | ICD-10-CM | POA: Diagnosis not present

## 2015-09-11 DIAGNOSIS — M4807 Spinal stenosis, lumbosacral region: Secondary | ICD-10-CM | POA: Diagnosis not present

## 2015-09-11 DIAGNOSIS — M5416 Radiculopathy, lumbar region: Secondary | ICD-10-CM | POA: Diagnosis not present

## 2015-09-11 DIAGNOSIS — G894 Chronic pain syndrome: Secondary | ICD-10-CM | POA: Diagnosis not present

## 2015-10-12 DIAGNOSIS — M5416 Radiculopathy, lumbar region: Secondary | ICD-10-CM | POA: Diagnosis not present

## 2015-11-09 DIAGNOSIS — M5442 Lumbago with sciatica, left side: Secondary | ICD-10-CM | POA: Diagnosis not present

## 2015-11-09 DIAGNOSIS — M5416 Radiculopathy, lumbar region: Secondary | ICD-10-CM | POA: Diagnosis not present

## 2015-11-09 DIAGNOSIS — G894 Chronic pain syndrome: Secondary | ICD-10-CM | POA: Diagnosis not present

## 2015-11-09 DIAGNOSIS — M4807 Spinal stenosis, lumbosacral region: Secondary | ICD-10-CM | POA: Diagnosis not present

## 2015-12-17 DIAGNOSIS — I10 Essential (primary) hypertension: Secondary | ICD-10-CM | POA: Diagnosis not present

## 2015-12-17 DIAGNOSIS — R7309 Other abnormal glucose: Secondary | ICD-10-CM | POA: Diagnosis not present

## 2015-12-17 DIAGNOSIS — Z6841 Body Mass Index (BMI) 40.0 and over, adult: Secondary | ICD-10-CM | POA: Diagnosis not present

## 2015-12-17 DIAGNOSIS — Z1389 Encounter for screening for other disorder: Secondary | ICD-10-CM | POA: Diagnosis not present

## 2015-12-17 DIAGNOSIS — E559 Vitamin D deficiency, unspecified: Secondary | ICD-10-CM | POA: Diagnosis not present

## 2015-12-17 DIAGNOSIS — E782 Mixed hyperlipidemia: Secondary | ICD-10-CM | POA: Diagnosis not present

## 2015-12-17 DIAGNOSIS — M1991 Primary osteoarthritis, unspecified site: Secondary | ICD-10-CM | POA: Diagnosis not present

## 2015-12-30 DIAGNOSIS — R748 Abnormal levels of other serum enzymes: Secondary | ICD-10-CM | POA: Diagnosis not present

## 2015-12-30 DIAGNOSIS — R945 Abnormal results of liver function studies: Secondary | ICD-10-CM | POA: Diagnosis not present

## 2015-12-30 DIAGNOSIS — Z6841 Body Mass Index (BMI) 40.0 and over, adult: Secondary | ICD-10-CM | POA: Diagnosis not present

## 2015-12-30 DIAGNOSIS — R52 Pain, unspecified: Secondary | ICD-10-CM | POA: Diagnosis not present

## 2015-12-30 DIAGNOSIS — Z1389 Encounter for screening for other disorder: Secondary | ICD-10-CM | POA: Diagnosis not present

## 2016-01-01 DIAGNOSIS — M4807 Spinal stenosis, lumbosacral region: Secondary | ICD-10-CM | POA: Diagnosis not present

## 2016-01-01 DIAGNOSIS — G894 Chronic pain syndrome: Secondary | ICD-10-CM | POA: Diagnosis not present

## 2016-01-01 DIAGNOSIS — M5136 Other intervertebral disc degeneration, lumbar region: Secondary | ICD-10-CM | POA: Diagnosis not present

## 2016-01-01 DIAGNOSIS — M5416 Radiculopathy, lumbar region: Secondary | ICD-10-CM | POA: Diagnosis not present

## 2016-01-12 ENCOUNTER — Encounter (INDEPENDENT_AMBULATORY_CARE_PROVIDER_SITE_OTHER): Payer: Self-pay | Admitting: *Deleted

## 2016-01-27 ENCOUNTER — Ambulatory Visit (INDEPENDENT_AMBULATORY_CARE_PROVIDER_SITE_OTHER): Payer: Medicare Other | Admitting: Internal Medicine

## 2016-01-27 DIAGNOSIS — E782 Mixed hyperlipidemia: Secondary | ICD-10-CM | POA: Diagnosis not present

## 2016-01-27 DIAGNOSIS — R5383 Other fatigue: Secondary | ICD-10-CM | POA: Diagnosis not present

## 2016-01-27 DIAGNOSIS — M2550 Pain in unspecified joint: Secondary | ICD-10-CM | POA: Diagnosis not present

## 2016-01-27 DIAGNOSIS — G629 Polyneuropathy, unspecified: Secondary | ICD-10-CM | POA: Diagnosis not present

## 2016-01-27 DIAGNOSIS — M353 Polymyalgia rheumatica: Secondary | ICD-10-CM | POA: Diagnosis not present

## 2016-01-27 DIAGNOSIS — M0689 Other specified rheumatoid arthritis, multiple sites: Secondary | ICD-10-CM | POA: Diagnosis not present

## 2016-01-27 DIAGNOSIS — Z6841 Body Mass Index (BMI) 40.0 and over, adult: Secondary | ICD-10-CM | POA: Diagnosis not present

## 2016-01-27 DIAGNOSIS — E669 Obesity, unspecified: Secondary | ICD-10-CM | POA: Diagnosis not present

## 2016-01-27 DIAGNOSIS — E538 Deficiency of other specified B group vitamins: Secondary | ICD-10-CM | POA: Diagnosis not present

## 2016-01-27 DIAGNOSIS — I10 Essential (primary) hypertension: Secondary | ICD-10-CM | POA: Diagnosis not present

## 2016-02-10 DIAGNOSIS — R5383 Other fatigue: Secondary | ICD-10-CM | POA: Diagnosis not present

## 2016-02-10 DIAGNOSIS — M2550 Pain in unspecified joint: Secondary | ICD-10-CM | POA: Diagnosis not present

## 2016-02-10 DIAGNOSIS — G629 Polyneuropathy, unspecified: Secondary | ICD-10-CM | POA: Diagnosis not present

## 2016-02-10 DIAGNOSIS — E538 Deficiency of other specified B group vitamins: Secondary | ICD-10-CM | POA: Diagnosis not present

## 2016-02-10 DIAGNOSIS — M353 Polymyalgia rheumatica: Secondary | ICD-10-CM | POA: Diagnosis not present

## 2016-02-29 ENCOUNTER — Telehealth: Payer: Self-pay | Admitting: Neurology

## 2016-02-29 ENCOUNTER — Encounter: Payer: Self-pay | Admitting: Neurology

## 2016-02-29 ENCOUNTER — Ambulatory Visit (INDEPENDENT_AMBULATORY_CARE_PROVIDER_SITE_OTHER): Payer: Medicare Other | Admitting: Neurology

## 2016-02-29 VITALS — BP 124/81 | HR 86 | Ht 61.0 in | Wt 268.0 lb

## 2016-02-29 DIAGNOSIS — M2550 Pain in unspecified joint: Secondary | ICD-10-CM

## 2016-02-29 DIAGNOSIS — M4806 Spinal stenosis, lumbar region: Secondary | ICD-10-CM | POA: Diagnosis not present

## 2016-02-29 DIAGNOSIS — R269 Unspecified abnormalities of gait and mobility: Secondary | ICD-10-CM | POA: Diagnosis not present

## 2016-02-29 DIAGNOSIS — M48061 Spinal stenosis, lumbar region without neurogenic claudication: Secondary | ICD-10-CM

## 2016-02-29 DIAGNOSIS — M4807 Spinal stenosis, lumbosacral region: Secondary | ICD-10-CM | POA: Insufficient documentation

## 2016-02-29 NOTE — Progress Notes (Signed)
PATIENT: Nicole [name] DOB: 14-Jun-1946  Chief Complaint  Patient presents with  . Polymyalgia/Polyarthralgia    She is here with her sister-in-law, Hoyle Sauer, to have her full body joint pain and weakness evaluated.  Reports her symptoms have been worsening over the last 6-8 months.  She is having increased problems with gait (slow and unsteady) but denies any falls.  She uses a walker to assist with ambulation.     HISTORICAL  Nicole [name] is a 70 years old right-handed female, accompanied by her sister-in-law Hoyle Sauer, seen in refer by her primary care physician  Dr. Sharilyn Sites for evaluation of diffuse body achy pain, gait difficulty in May ninth 2017.  I reviewed and summarized referring note, she had a history of hypertension, hyperlipidemia, osteoarthritis, history of chronic low back pain, lumbar stenosis, has been treated by pain management Dr. Nelva Bush since 2013  She had a history of chronic low back pain, progressively worse over the past few years, radiating pain to bilateral lower extremity, left worse than right, left leg give out underneath her, prior to December 2016, she was able to ambulate independently, but had rapid decline since, she complains of diffuse body achy pain, multiple joints pain, worsening gait difficulty, has to rely on her walker is to take few steps at her house,  She denies bowel and bladder incontinence, she also complains of worsening nocturia, she is now taking Nucynta 50 mg 3 times a day, with limited help of her pain, complains of drowsiness Per record, in April 2017, she was treated with methylprednisolone 4 mg tapering dose, diclofenac 75 mg twice a day, muscle relaxant Flexeril 5 mg every night, patient reported limited help from those polypharmacy. Tramadol makes her confused, falling backwards  I reviewed laboratory evaluation in 2017, normal CBC, WBC 6.5, hemoglobin 13. 4, normal CMP, with exception of elevated creatinine 1.1 9, elevated LDL 177,  cholesterol 266, triglycerides right 165, mildly low vitamin D 25.9, urine culture was negative,  rheumatoid factor was elevated 44.5, ANA was negative, Lyme titer was negative 0.9, hepatitis function showed mild elevated ALT 39, hepatitis panel A, B, C was negative, ionized calcium was normal 5.3, alkaline phosphate was high at 120, normal TSH 1.96, cortisone level was normal folic acid was normal 8.6, vitamin B12 597, PTH 48, esr rate was 56 elevated  I was able to review her MRI of lumbar in 2009 from [facility] radiologist, severe lumbar stenosis at L 3-4, MRI of cervical spine multilevel cervical degenerative disc disease, no significant canal stenosis, no cord signal changes. MRI of the brain, mild atrophy, no acute lesions.  She lives alone, can barely maintaining independence because of her muscle pain, joints pain gait abnormality  REVIEW OF SYSTEMS: Full 14 system review of systems performed and notable only for fatigue, swelling legs, joint pain, swelling, achy muscles, feeling cold, numbness, weakness, snoring, restless leg, decreased energy ALLERGIES: Allergies  Allergen Reactions  . Sulfa Antibiotics Other (See Comments)    Sores  . Tramadol     "Makes me feel like I am falling"  . Penicillins Rash    HOME MEDICATIONS: Current Outpatient Prescriptions  Medication Sig Dispense Refill  . diclofenac (VOLTAREN) 75 MG EC tablet Take 75 mg by mouth 2 (two) times daily.    [name] gabapentin (NEURONTIN) 100 MG capsule Take 200 mg by mouth 3 (three) times daily.     . NONFORMULARY OR COMPOUNDED ITEM Apply 1-2 application topically every 8 (eight) hours as needed (Diclofenac 3%,  Lidocaine 5%, Menthol 1%     (Cream 100 Gm)).    . NUCYNTA 50 MG TABS tablet 3 (three) times daily as needed.    . pantoprazole (PROTONIX) 40 MG tablet Take 40 mg by mouth daily.     . valsartan-hydrochlorothiazide (DIOVAN-HCT) 160-25 MG per tablet Take 1 tablet by mouth daily.    . Vitamin D, Ergocalciferol, (DRISDOL)  50000 UNITS CAPS capsule Take 50,000 Units by mouth every 7 (seven) days. Takes on Wednesday     No current facility-administered medications for this visit.    PAST MEDICAL HISTORY: Past Medical History  Diagnosis Date  . Hypertension   . Gastroesophageal reflux   . Spinal stenosis   . Carpal tunnel syndrome, bilateral   . AC (acromioclavicular) joint bone spurs     lt shoulder  . Arthritis   . Shingles   . Anemia   . Headache     recent visit to ER @ [facility] for severe headache  . Polymyalgia ([place])   . Polyarthralgia   . Lumbar stenosis     Hx of ESIs by Dr. Nelva Bush    PAST SURGICAL HISTORY: Past Surgical History  Procedure Laterality Date  . Cholecystectomy    . Colonoscopy  06/11/2012    Procedure: COLONOSCOPY;  Surgeon: Jamesetta So, MD;  [facility];  Service: Gastroenterology;  Laterality: N/A;  . Hysteroscopy w/d&c N/A 02/25/2014    Procedure: DILATATION AND CURETTAGE /HYSTEROSCOPY;  Surgeon: Florian Buff, MD;  Location: [facility];  Service: Gynecology;  Laterality: N/A;  . Polypectomy N/A 02/25/2014    Procedure: POLYPECTOMY;  Surgeon: Florian Buff, MD;  Location[facility];  Service: Gynecology;  Laterality: N/A;  . Resection distal clavical Right 03/26/2015    Procedure: OPEN DISTAL CLAVICAL RESECTION ;  Surgeon: Netta Cedars, MD;  Location: [facility];  Service: Orthopedics;  Laterality: Right;    FAMILY HISTORY: Family History  Problem Relation Age of Onset  . Hypertension Mother   . Pneumonia Father     SOCIAL HISTORY:  Social History   Social History  . Marital Status: Widowed    Spouse Name: N/A  . Number of Children: 2  . Years of Education: 1 yr coll   Occupational History  . Retired    Social History Main Topics  . Smoking status: Never Smoker   . Smokeless tobacco: Never Used  . Alcohol Use: No  . Drug Use: No  . Sexual Activity: Yes    Birth Control/ Protection: Post-menopausal   Other Topics Concern  . Not on file   Social  History Narrative   Lives at home alone.   Right-handed.   1-2 cups caffeine daily.     PHYSICAL EXAM   Filed Vitals:   02/29/16 1112  BP: 124/81  Pulse: 86  Height: _0  (1.549 m)  Weight: 268 lb (121.564 kg)    Not recorded      Body mass index is 50.66 kg/(m^2).  PHYSICAL EXAMNIATION:  Gen: NAD, conversant, well nourised, obese, well groomed                     Cardiovascular: Regular rate rhythm, no peripheral edema, warm, nontender. Eyes: Conjunctivae clear without exudates or hemorrhage Neck: Supple, no carotid bruise. Pulmonary: Clear to auscultation bilaterally   NEUROLOGICAL EXAM:  MENTAL STATUS: Speech:    Speech is normal; fluent and spontaneous with normal comprehension.  Cognition:     Orientation to time, place  and person     Normal recent and remote memory     Normal Attention span and concentration     Normal Language, naming, repeating,spontaneous speech     Fund of knowledge   CRANIAL NERVES: CN II: Visual fields are full to confrontation. Fundoscopic exam is normal with sharp discs and no vascular changes. Pupils are round equal and briskly reactive to light. CN III, IV, VI: extraocular movement are normal. No ptosis. CN V: Facial sensation is intact to pinprick in all 3 divisions bilaterally. Corneal responses are intact.  CN VII: Face is symmetric with normal eye closure and smile. CN VIII: Hearing is normal to rubbing fingers CN IX, X: Palate elevates symmetrically. Phonation is normal. CN XI: Head turning and shoulder shrug are intact CN XII: Tongue is midline with normal movements and no atrophy.  MOTOR: Motor examination limited because of the pain, there was no significant bilateral upper and lower extremity proximal weakness, bilateral hands grip was normal, she has mild to moderate bilateral ankle dorsiflexion weakness, left worse than right.  REFLEXES: Reflexes are 1 and symmetric at the biceps, triceps, knees, and absent at  ankles. Plantar responses are flexor.  SENSORY: Length dependent decreased to light touch and vibratory sensation to distal shin level   Coordination: There was no no dysmetria on finger-to-nose and heel-knee-shin.    GAIT/STANCE: Need assistant to get up from seated position, antalgic, with left foot drop, unsteady,   DIAGNOSTIC DATA (LABS, IMAGING, TESTING) - I reviewed patient records, labs, notes, testing and imaging myself where available.   ASSESSMENT AND PLAN  Tiombe Tomeo is a 70 y.o. female    gait abnormality  Multifactorial, distal weakness due to lumbar stenosis, multiple joints pain,  evidence of severe lumbar stenosis based on MRI lumbar in 2009  She has repeat MRI of lumbar, will bring disc at Follow up visit  EMG nerve conduction study  Refer her to physical therapy, social work consult,  Referred her to pace program     Marcial Pacas, M.D. Ph.D.  [facility] [address], [place], [place] [address] Ph: [phone] Fax: [phone]  GZ:FPOI Hilma Favors, MD

## 2016-02-29 NOTE — Telephone Encounter (Signed)
I have referred her to [facility], please follow up

## 2016-03-01 ENCOUNTER — Telehealth: Payer: Self-pay | Admitting: Neurology

## 2016-03-01 LAB — C-REACTIVE PROTEIN: CRP: 12.2 mg/L — ABNORMAL HIGH (ref 0.0–4.9)

## 2016-03-01 LAB — CK: Total CK: 137 U/L (ref 24–173)

## 2016-03-01 LAB — SEDIMENTATION RATE: Sed Rate: 19 mm/hr (ref 0–40)

## 2016-03-01 LAB — ANA W/REFLEX IF POSITIVE: Anti Nuclear Antibody(ANA): NEGATIVE

## 2016-03-01 LAB — RHEUMATOID FACTOR: Rhuematoid fact SerPl-aCnc: 46.8 IU/mL — ABNORMAL HIGH (ref 0.0–13.9)

## 2016-03-01 NOTE — Telephone Encounter (Signed)
Please call patient, laboratory evaluation showed positive rheumatoid factor of 47, elevated C reactive protein 12, indicating inflammatory process,  She would benefit rheumatology consultation for her joints pain, if she wished, we can initiate referring, continue neurological evaluation as discussed during office visit

## 2016-03-01 NOTE — Telephone Encounter (Signed)
She is agreeable to continue her neurology consultation and would like to proceed with a rheumatology.  She would like to see Dr. Estanislado Pandy.

## 2016-03-02 ENCOUNTER — Other Ambulatory Visit: Payer: Self-pay | Admitting: *Deleted

## 2016-03-02 ENCOUNTER — Telehealth: Payer: Self-pay | Admitting: Neurology

## 2016-03-02 DIAGNOSIS — R768 Other specified abnormal immunological findings in serum: Secondary | ICD-10-CM

## 2016-03-02 NOTE — Telephone Encounter (Signed)
Ok per Dr. Krista Blue - order placed in Epic and sent to the referral coordinator, Rance Muir.

## 2016-03-02 NOTE — Telephone Encounter (Signed)
Nicole [name] with [facility] is calling to advise that physical therapy and social work will not begin until next Monday for the patient. A returned call is not needed unless there are questions.

## 2016-03-06 DIAGNOSIS — M6281 Muscle weakness (generalized): Secondary | ICD-10-CM | POA: Diagnosis not present

## 2016-03-06 DIAGNOSIS — I10 Essential (primary) hypertension: Secondary | ICD-10-CM | POA: Diagnosis not present

## 2016-03-06 DIAGNOSIS — R2689 Other abnormalities of gait and mobility: Secondary | ICD-10-CM | POA: Diagnosis not present

## 2016-03-06 DIAGNOSIS — M21372 Foot drop, left foot: Secondary | ICD-10-CM | POA: Diagnosis not present

## 2016-03-06 DIAGNOSIS — M545 Low back pain: Secondary | ICD-10-CM | POA: Diagnosis not present

## 2016-03-06 DIAGNOSIS — M4806 Spinal stenosis, lumbar region: Secondary | ICD-10-CM | POA: Diagnosis not present

## 2016-03-06 DIAGNOSIS — M150 Primary generalized (osteo)arthritis: Secondary | ICD-10-CM | POA: Diagnosis not present

## 2016-03-06 DIAGNOSIS — M353 Polymyalgia rheumatica: Secondary | ICD-10-CM | POA: Diagnosis not present

## 2016-03-06 NOTE — Telephone Encounter (Signed)
Amy/[facility] [phone] called requesting VO to see her 3 x wk for 1 wk and then 2x wk for 4 wks. Needs OT order for eval to assist with ADL. Social work will see her next week.

## 2016-03-06 NOTE — Telephone Encounter (Signed)
Spoke to Nicole [name] at [facility] - patient is going to continue PT and OT - she will send over orders for review.  Also, she is scheduled to see a Education officer, museum next week who can help her complete her paperwork for the PACE program.  She will call us back if anything further is needed.

## 2016-03-07 ENCOUNTER — Telehealth: Payer: Self-pay | Admitting: *Deleted

## 2016-03-07 NOTE — Telephone Encounter (Signed)
LMVM for [facility], for fax #.  (Dr. Estanislado Pandy)- rheumatology.

## 2016-03-08 DIAGNOSIS — M6281 Muscle weakness (generalized): Secondary | ICD-10-CM | POA: Diagnosis not present

## 2016-03-08 DIAGNOSIS — M21372 Foot drop, left foot: Secondary | ICD-10-CM | POA: Diagnosis not present

## 2016-03-08 DIAGNOSIS — R2689 Other abnormalities of gait and mobility: Secondary | ICD-10-CM | POA: Diagnosis not present

## 2016-03-08 DIAGNOSIS — M353 Polymyalgia rheumatica: Secondary | ICD-10-CM | POA: Diagnosis not present

## 2016-03-08 DIAGNOSIS — M4806 Spinal stenosis, lumbar region: Secondary | ICD-10-CM | POA: Diagnosis not present

## 2016-03-08 DIAGNOSIS — M150 Primary generalized (osteo)arthritis: Secondary | ICD-10-CM | POA: Diagnosis not present

## 2016-03-08 NOTE — Telephone Encounter (Signed)
Spoke to [place] from [place] and she will be doing intake on patient . Renee could not give me apt date because its a couple of weeks turn around time.

## 2016-03-09 DIAGNOSIS — M21372 Foot drop, left foot: Secondary | ICD-10-CM | POA: Diagnosis not present

## 2016-03-09 DIAGNOSIS — M4806 Spinal stenosis, lumbar region: Secondary | ICD-10-CM | POA: Diagnosis not present

## 2016-03-09 DIAGNOSIS — M150 Primary generalized (osteo)arthritis: Secondary | ICD-10-CM | POA: Diagnosis not present

## 2016-03-09 DIAGNOSIS — M353 Polymyalgia rheumatica: Secondary | ICD-10-CM | POA: Diagnosis not present

## 2016-03-09 DIAGNOSIS — R2689 Other abnormalities of gait and mobility: Secondary | ICD-10-CM | POA: Diagnosis not present

## 2016-03-09 DIAGNOSIS — M6281 Muscle weakness (generalized): Secondary | ICD-10-CM | POA: Diagnosis not present

## 2016-03-10 DIAGNOSIS — M130 Polyarthritis, unspecified: Secondary | ICD-10-CM | POA: Diagnosis not present

## 2016-03-10 DIAGNOSIS — Z681 Body mass index (BMI) 19 or less, adult: Secondary | ICD-10-CM | POA: Diagnosis not present

## 2016-03-10 DIAGNOSIS — I10 Essential (primary) hypertension: Secondary | ICD-10-CM | POA: Diagnosis not present

## 2016-03-13 DIAGNOSIS — M4806 Spinal stenosis, lumbar region: Secondary | ICD-10-CM | POA: Diagnosis not present

## 2016-03-13 DIAGNOSIS — M150 Primary generalized (osteo)arthritis: Secondary | ICD-10-CM | POA: Diagnosis not present

## 2016-03-13 DIAGNOSIS — M21372 Foot drop, left foot: Secondary | ICD-10-CM | POA: Diagnosis not present

## 2016-03-13 DIAGNOSIS — R2689 Other abnormalities of gait and mobility: Secondary | ICD-10-CM | POA: Diagnosis not present

## 2016-03-13 DIAGNOSIS — M353 Polymyalgia rheumatica: Secondary | ICD-10-CM | POA: Diagnosis not present

## 2016-03-13 DIAGNOSIS — M6281 Muscle weakness (generalized): Secondary | ICD-10-CM | POA: Diagnosis not present

## 2016-03-14 ENCOUNTER — Telehealth: Payer: Self-pay | Admitting: *Deleted

## 2016-03-14 DIAGNOSIS — M6281 Muscle weakness (generalized): Secondary | ICD-10-CM | POA: Diagnosis not present

## 2016-03-14 DIAGNOSIS — M150 Primary generalized (osteo)arthritis: Secondary | ICD-10-CM | POA: Diagnosis not present

## 2016-03-14 DIAGNOSIS — M21372 Foot drop, left foot: Secondary | ICD-10-CM | POA: Diagnosis not present

## 2016-03-14 DIAGNOSIS — M4806 Spinal stenosis, lumbar region: Secondary | ICD-10-CM | POA: Diagnosis not present

## 2016-03-14 DIAGNOSIS — R2689 Other abnormalities of gait and mobility: Secondary | ICD-10-CM | POA: Diagnosis not present

## 2016-03-14 DIAGNOSIS — M353 Polymyalgia rheumatica: Secondary | ICD-10-CM | POA: Diagnosis not present

## 2016-03-14 NOTE — Telephone Encounter (Signed)
Message For: OFC                  Taken 23-MAY-17 at  2:13PM by TBW ------------------------------------------------------------ Caller MICHELLE/[facility]>>  CID [id number]  Patient Nicole [name]          Pt's Dr Krista Blue          Area Code 336 Phone# [phone]      RE >>HEALTH<RE:OCCUPATIONAL THERAPY/ COMPLETED       CALLING FOR VERBAL ORDERS TO CONTINUE                Disp:Y/N N If Y = C/B If No Response In 50minutes ============================================================

## 2016-03-14 NOTE — Telephone Encounter (Signed)
Ok per Dr. Krista Blue to extend OT.  Spoke to [place] at [facility] to provide verbal orders.

## 2016-03-16 DIAGNOSIS — M4806 Spinal stenosis, lumbar region: Secondary | ICD-10-CM | POA: Diagnosis not present

## 2016-03-16 DIAGNOSIS — M21372 Foot drop, left foot: Secondary | ICD-10-CM | POA: Diagnosis not present

## 2016-03-16 DIAGNOSIS — M353 Polymyalgia rheumatica: Secondary | ICD-10-CM | POA: Diagnosis not present

## 2016-03-16 DIAGNOSIS — R2689 Other abnormalities of gait and mobility: Secondary | ICD-10-CM | POA: Diagnosis not present

## 2016-03-16 DIAGNOSIS — M150 Primary generalized (osteo)arthritis: Secondary | ICD-10-CM | POA: Diagnosis not present

## 2016-03-16 DIAGNOSIS — M6281 Muscle weakness (generalized): Secondary | ICD-10-CM | POA: Diagnosis not present

## 2016-03-17 DIAGNOSIS — M6281 Muscle weakness (generalized): Secondary | ICD-10-CM | POA: Diagnosis not present

## 2016-03-17 DIAGNOSIS — M4806 Spinal stenosis, lumbar region: Secondary | ICD-10-CM | POA: Diagnosis not present

## 2016-03-17 DIAGNOSIS — M150 Primary generalized (osteo)arthritis: Secondary | ICD-10-CM | POA: Diagnosis not present

## 2016-03-17 DIAGNOSIS — R2689 Other abnormalities of gait and mobility: Secondary | ICD-10-CM | POA: Diagnosis not present

## 2016-03-17 DIAGNOSIS — M21372 Foot drop, left foot: Secondary | ICD-10-CM | POA: Diagnosis not present

## 2016-03-17 DIAGNOSIS — M353 Polymyalgia rheumatica: Secondary | ICD-10-CM | POA: Diagnosis not present

## 2016-03-21 DIAGNOSIS — M6281 Muscle weakness (generalized): Secondary | ICD-10-CM | POA: Diagnosis not present

## 2016-03-21 DIAGNOSIS — M353 Polymyalgia rheumatica: Secondary | ICD-10-CM | POA: Diagnosis not present

## 2016-03-21 DIAGNOSIS — R2689 Other abnormalities of gait and mobility: Secondary | ICD-10-CM | POA: Diagnosis not present

## 2016-03-21 DIAGNOSIS — M150 Primary generalized (osteo)arthritis: Secondary | ICD-10-CM | POA: Diagnosis not present

## 2016-03-21 DIAGNOSIS — M21372 Foot drop, left foot: Secondary | ICD-10-CM | POA: Diagnosis not present

## 2016-03-21 DIAGNOSIS — M4806 Spinal stenosis, lumbar region: Secondary | ICD-10-CM | POA: Diagnosis not present

## 2016-03-22 DIAGNOSIS — G8929 Other chronic pain: Secondary | ICD-10-CM | POA: Diagnosis not present

## 2016-03-22 DIAGNOSIS — M5136 Other intervertebral disc degeneration, lumbar region: Secondary | ICD-10-CM | POA: Diagnosis not present

## 2016-03-22 DIAGNOSIS — M4807 Spinal stenosis, lumbosacral region: Secondary | ICD-10-CM | POA: Diagnosis not present

## 2016-03-22 DIAGNOSIS — M5442 Lumbago with sciatica, left side: Secondary | ICD-10-CM | POA: Diagnosis not present

## 2016-03-22 NOTE — Telephone Encounter (Signed)
Pt is scheduled 04-20-16 at 0945 with Dr. Rolm Gala.

## 2016-03-23 DIAGNOSIS — M21372 Foot drop, left foot: Secondary | ICD-10-CM | POA: Diagnosis not present

## 2016-03-23 DIAGNOSIS — M150 Primary generalized (osteo)arthritis: Secondary | ICD-10-CM | POA: Diagnosis not present

## 2016-03-23 DIAGNOSIS — M4806 Spinal stenosis, lumbar region: Secondary | ICD-10-CM | POA: Diagnosis not present

## 2016-03-23 DIAGNOSIS — M353 Polymyalgia rheumatica: Secondary | ICD-10-CM | POA: Diagnosis not present

## 2016-03-23 DIAGNOSIS — M6281 Muscle weakness (generalized): Secondary | ICD-10-CM | POA: Diagnosis not present

## 2016-03-23 DIAGNOSIS — R2689 Other abnormalities of gait and mobility: Secondary | ICD-10-CM | POA: Diagnosis not present

## 2016-03-24 DIAGNOSIS — M21372 Foot drop, left foot: Secondary | ICD-10-CM | POA: Diagnosis not present

## 2016-03-24 DIAGNOSIS — M4806 Spinal stenosis, lumbar region: Secondary | ICD-10-CM | POA: Diagnosis not present

## 2016-03-24 DIAGNOSIS — R2689 Other abnormalities of gait and mobility: Secondary | ICD-10-CM | POA: Diagnosis not present

## 2016-03-24 DIAGNOSIS — M6281 Muscle weakness (generalized): Secondary | ICD-10-CM | POA: Diagnosis not present

## 2016-03-24 DIAGNOSIS — M150 Primary generalized (osteo)arthritis: Secondary | ICD-10-CM | POA: Diagnosis not present

## 2016-03-24 DIAGNOSIS — M353 Polymyalgia rheumatica: Secondary | ICD-10-CM | POA: Diagnosis not present

## 2016-03-27 DIAGNOSIS — R2689 Other abnormalities of gait and mobility: Secondary | ICD-10-CM | POA: Diagnosis not present

## 2016-03-27 DIAGNOSIS — M6281 Muscle weakness (generalized): Secondary | ICD-10-CM | POA: Diagnosis not present

## 2016-03-27 DIAGNOSIS — M353 Polymyalgia rheumatica: Secondary | ICD-10-CM | POA: Diagnosis not present

## 2016-03-27 DIAGNOSIS — M150 Primary generalized (osteo)arthritis: Secondary | ICD-10-CM | POA: Diagnosis not present

## 2016-03-27 DIAGNOSIS — M21372 Foot drop, left foot: Secondary | ICD-10-CM | POA: Diagnosis not present

## 2016-03-27 DIAGNOSIS — M4806 Spinal stenosis, lumbar region: Secondary | ICD-10-CM | POA: Diagnosis not present

## 2016-03-28 DIAGNOSIS — R2689 Other abnormalities of gait and mobility: Secondary | ICD-10-CM | POA: Diagnosis not present

## 2016-03-28 DIAGNOSIS — M4806 Spinal stenosis, lumbar region: Secondary | ICD-10-CM | POA: Diagnosis not present

## 2016-03-28 DIAGNOSIS — M21372 Foot drop, left foot: Secondary | ICD-10-CM | POA: Diagnosis not present

## 2016-03-28 DIAGNOSIS — M6281 Muscle weakness (generalized): Secondary | ICD-10-CM | POA: Diagnosis not present

## 2016-03-28 DIAGNOSIS — M150 Primary generalized (osteo)arthritis: Secondary | ICD-10-CM | POA: Diagnosis not present

## 2016-03-28 DIAGNOSIS — M353 Polymyalgia rheumatica: Secondary | ICD-10-CM | POA: Diagnosis not present

## 2016-03-29 ENCOUNTER — Telehealth: Payer: Self-pay | Admitting: Neurology

## 2016-03-29 NOTE — Telephone Encounter (Signed)
sent medical records to [place]

## 2016-03-30 DIAGNOSIS — M353 Polymyalgia rheumatica: Secondary | ICD-10-CM | POA: Diagnosis not present

## 2016-03-30 DIAGNOSIS — M150 Primary generalized (osteo)arthritis: Secondary | ICD-10-CM | POA: Diagnosis not present

## 2016-03-30 DIAGNOSIS — M6281 Muscle weakness (generalized): Secondary | ICD-10-CM | POA: Diagnosis not present

## 2016-03-30 DIAGNOSIS — M4806 Spinal stenosis, lumbar region: Secondary | ICD-10-CM | POA: Diagnosis not present

## 2016-03-30 DIAGNOSIS — M21372 Foot drop, left foot: Secondary | ICD-10-CM | POA: Diagnosis not present

## 2016-03-30 DIAGNOSIS — R2689 Other abnormalities of gait and mobility: Secondary | ICD-10-CM | POA: Diagnosis not present

## 2016-04-03 ENCOUNTER — Telehealth: Payer: Self-pay | Admitting: Neurology

## 2016-04-03 DIAGNOSIS — R2689 Other abnormalities of gait and mobility: Secondary | ICD-10-CM | POA: Diagnosis not present

## 2016-04-03 DIAGNOSIS — M4806 Spinal stenosis, lumbar region: Secondary | ICD-10-CM | POA: Diagnosis not present

## 2016-04-03 DIAGNOSIS — M150 Primary generalized (osteo)arthritis: Secondary | ICD-10-CM | POA: Diagnosis not present

## 2016-04-03 DIAGNOSIS — M6281 Muscle weakness (generalized): Secondary | ICD-10-CM | POA: Diagnosis not present

## 2016-04-03 DIAGNOSIS — M21372 Foot drop, left foot: Secondary | ICD-10-CM | POA: Diagnosis not present

## 2016-04-03 DIAGNOSIS — M353 Polymyalgia rheumatica: Secondary | ICD-10-CM | POA: Diagnosis not present

## 2016-04-03 NOTE — Telephone Encounter (Signed)
Left message for Nicole [name] at [facility] - ok to extend orders.

## 2016-04-03 NOTE — Telephone Encounter (Signed)
Please call to give VO as per request, see below.

## 2016-04-03 NOTE — Telephone Encounter (Signed)
Amy with [facility] needs verbal orders: extend pt 2x a week for 2 weeks starting next week .  Phone : [phone]

## 2016-04-05 DIAGNOSIS — R2689 Other abnormalities of gait and mobility: Secondary | ICD-10-CM | POA: Diagnosis not present

## 2016-04-05 DIAGNOSIS — M353 Polymyalgia rheumatica: Secondary | ICD-10-CM | POA: Diagnosis not present

## 2016-04-05 DIAGNOSIS — M4806 Spinal stenosis, lumbar region: Secondary | ICD-10-CM | POA: Diagnosis not present

## 2016-04-05 DIAGNOSIS — M150 Primary generalized (osteo)arthritis: Secondary | ICD-10-CM | POA: Diagnosis not present

## 2016-04-05 DIAGNOSIS — M21372 Foot drop, left foot: Secondary | ICD-10-CM | POA: Diagnosis not present

## 2016-04-05 DIAGNOSIS — M6281 Muscle weakness (generalized): Secondary | ICD-10-CM | POA: Diagnosis not present

## 2016-04-11 DIAGNOSIS — M6281 Muscle weakness (generalized): Secondary | ICD-10-CM | POA: Diagnosis not present

## 2016-04-11 DIAGNOSIS — M150 Primary generalized (osteo)arthritis: Secondary | ICD-10-CM | POA: Diagnosis not present

## 2016-04-11 DIAGNOSIS — M4806 Spinal stenosis, lumbar region: Secondary | ICD-10-CM | POA: Diagnosis not present

## 2016-04-11 DIAGNOSIS — M353 Polymyalgia rheumatica: Secondary | ICD-10-CM | POA: Diagnosis not present

## 2016-04-11 DIAGNOSIS — R2689 Other abnormalities of gait and mobility: Secondary | ICD-10-CM | POA: Diagnosis not present

## 2016-04-11 DIAGNOSIS — M21372 Foot drop, left foot: Secondary | ICD-10-CM | POA: Diagnosis not present

## 2016-04-12 ENCOUNTER — Ambulatory Visit (INDEPENDENT_AMBULATORY_CARE_PROVIDER_SITE_OTHER): Payer: Medicare Other | Admitting: Neurology

## 2016-04-12 ENCOUNTER — Ambulatory Visit (INDEPENDENT_AMBULATORY_CARE_PROVIDER_SITE_OTHER): Payer: Self-pay | Admitting: Neurology

## 2016-04-12 DIAGNOSIS — M48061 Spinal stenosis, lumbar region without neurogenic claudication: Secondary | ICD-10-CM

## 2016-04-12 DIAGNOSIS — Z0289 Encounter for other administrative examinations: Secondary | ICD-10-CM

## 2016-04-12 DIAGNOSIS — R269 Unspecified abnormalities of gait and mobility: Secondary | ICD-10-CM

## 2016-04-12 DIAGNOSIS — M4806 Spinal stenosis, lumbar region: Secondary | ICD-10-CM | POA: Diagnosis not present

## 2016-04-12 DIAGNOSIS — M2550 Pain in unspecified joint: Secondary | ICD-10-CM

## 2016-04-13 ENCOUNTER — Telehealth: Payer: Self-pay | Admitting: *Deleted

## 2016-04-13 DIAGNOSIS — R2689 Other abnormalities of gait and mobility: Secondary | ICD-10-CM | POA: Diagnosis not present

## 2016-04-13 DIAGNOSIS — M150 Primary generalized (osteo)arthritis: Secondary | ICD-10-CM | POA: Diagnosis not present

## 2016-04-13 DIAGNOSIS — M21372 Foot drop, left foot: Secondary | ICD-10-CM | POA: Diagnosis not present

## 2016-04-13 DIAGNOSIS — M353 Polymyalgia rheumatica: Secondary | ICD-10-CM | POA: Diagnosis not present

## 2016-04-13 DIAGNOSIS — M6281 Muscle weakness (generalized): Secondary | ICD-10-CM | POA: Diagnosis not present

## 2016-04-13 DIAGNOSIS — M4806 Spinal stenosis, lumbar region: Secondary | ICD-10-CM | POA: Diagnosis not present

## 2016-04-13 NOTE — Procedures (Signed)
   NCS (NERVE CONDUCTION STUDY) WITH EMG (ELECTROMYOGRAPHY) REPORT   STUDY DATE: April 13 2016 PATIENT NAME: Nicole [name] DOB: Jun 12, 1946 MRN: [id number]    TECHNOLOGIST: Laretta Alstrom ELECTROMYOGRAPHER: Marcial Pacas M.D.  CLINICAL INFORMATION:  70 years old female, with history of chronic low back pain, known history of L 3-4 severe lumbar stenosis, presented with progressive worsening low back pain, distal leg weakness, gait abnormality.  FINDINGS: NERVE CONDUCTION STUDY: Bilateral peroneal sensory responses were absent. Bilateral peroneal to EDB and tibial motor responses were absent. Bilateral tibial H reflexes were absent.  Left ulnar sensory and motor responses were normal.  Left median sensory responses showed moderately prolonged peak latency with well preserved snap amplitude. Left median motor responses showed severely prolonged distal latency, moderately prolonged F wave latency, with normal C map amplitude, conduction velocity.  NEEDLE ELECTROMYOGRAPHY: Selected needle examinations were performed at bilateral lower extremity muscles, bilateral lumbosacral paraspinal muscles.  Bilateral tibialis anterior, tibialis posterior, medial gastrocnemius, peroneal longus: Increased insertional activity, 1-2 plus spontaneous activity, enlarged complex motor unit potential with decreased recruitment patterns.  Bilateral biceps femoris long head, vastus lateralis: increased insertional activity, no spontaneous activity, enlarged complex motor unit potential with mildly decreased recruitment patterns.  There was increased insertional activity at bilateral lumbar sacral paraspinal muscles, at L3, there was evidence of spontaneous activity at bilateral L4, also evidence of enlarged complex motor unit potential at bilateral lumbar paraspinals at L3, L4, L5, S1.  IMPRESSION:  This is a marked abnormal study. There is electrodiagnostic evidence of active bilateral lumbosacral radiculopathy. In  addition, there is also evidence of mild length dependent axonal peripheral neuropathy.  INTERPRETING PHYSICIAN:   Marcial Pacas M.D. Ph.D. [facility] [address], [place], [place] [address] [phone]

## 2016-04-13 NOTE — Telephone Encounter (Signed)
Pt [employer] form on [employer].

## 2016-04-13 NOTE — Progress Notes (Signed)
PATIENT: Nicole [name] DOB: 11-03-1945  No chief complaint on file.    HISTORICAL  Nicole [name] is a 70 years old right-handed female, accompanied by her sister-in-law Hoyle Sauer, seen in refer by her primary care physician  Dr. Sharilyn Sites for evaluation of diffuse body achy pain, gait difficulty in May ninth 2017.  I reviewed and summarized referring note, she had a history of hypertension, hyperlipidemia, osteoarthritis, history of chronic low back pain, lumbar stenosis, has been treated by pain management Dr. Nelva Bush since 2013  She had a history of chronic low back pain, progressively worse over the past few years, radiating pain to bilateral lower extremity, left worse than right, left leg give out underneath her, prior to December 2016, she was able to ambulate independently, but had rapid decline since, she complains of diffuse body achy pain, multiple joints pain, worsening gait difficulty, has to rely on her walker is to take few steps at her house,  She denies bowel and bladder incontinence, she also complains of worsening nocturia, she is now taking Nucynta 50 mg 3 times a day, with limited help of her pain, complains of drowsiness Per record, in April 2017, she was treated with methylprednisolone 4 mg tapering dose, diclofenac 75 mg twice a day, muscle relaxant Flexeril 5 mg every night, patient reported limited help from those polypharmacy. Tramadol makes her confused, falling backwards  I reviewed laboratory evaluation in 2017, normal CBC, WBC 6.5, hemoglobin 13. 4, normal CMP, with exception of elevated creatinine 1.1 9, elevated LDL 177, cholesterol 266, triglycerides right 165, mildly low vitamin D 25.9, urine culture was negative,  rheumatoid factor was elevated 44.5, ANA was negative, Lyme titer was negative 0.9, hepatitis function showed mild elevated ALT 39, hepatitis panel A, B, C was negative, ionized calcium was normal 5.3, alkaline phosphate was high at 120, normal TSH 1.96,  cortisone level was normal folic acid was normal 8.6, vitamin B12 597, PTH 48, esr rate was 56 elevated  I was able to review her MRI of lumbar in 2009 from [facility] radiologist, severe lumbar stenosis at L 3-4, MRI of cervical spine multilevel cervical degenerative disc disease, no significant canal stenosis, no cord signal changes. MRI of the brain, mild atrophy, no acute lesions.  She lives alone, can barely maintaining independence because of her muscle pain, joints pain gait abnormality  UPDATE April 13 2016:  She came in for electrodiagnostic study today, which demonstrate active bilateral lumbar sacral radiculopathy. She has severe low back pain, limited range of motion, unsteady gait, at risk for fall,  she denies bowel and bladder incontinence,  I have referred her to neurosurgeon for lumbar decompression surgery,  REVIEW OF SYSTEMS: Full 14 system review of systems performed and notable only for fatigue, swelling legs, joint pain, swelling, achy muscles, feeling cold, numbness, weakness, snoring, restless leg, decreased energy  ALLERGIES: Allergies  Allergen Reactions  . Sulfa Antibiotics Other (See Comments)    Sores  . Tramadol     "Makes me feel like I am falling"  . Penicillins Rash    HOME MEDICATIONS: Current Outpatient Prescriptions  Medication Sig Dispense Refill  . diclofenac (VOLTAREN) 75 MG EC tablet Take 75 mg by mouth 2 (two) times daily.    [name] gabapentin (NEURONTIN) 100 MG capsule Take 200 mg by mouth 3 (three) times daily.     . NONFORMULARY OR COMPOUNDED ITEM Apply 1-2 application topically every 8 (eight) hours as needed (Diclofenac 3%, Lidocaine 5%, Menthol 1%     (  Cream 100 Gm)).    . NUCYNTA 50 MG TABS tablet 3 (three) times daily as needed.    . pantoprazole (PROTONIX) 40 MG tablet Take 40 mg by mouth daily.     . valsartan-hydrochlorothiazide (DIOVAN-HCT) 160-25 MG per tablet Take 1 tablet by mouth daily.    . Vitamin D, Ergocalciferol, (DRISDOL) 50000 UNITS  CAPS capsule Take 50,000 Units by mouth every 7 (seven) days. Takes on Wednesday     No current facility-administered medications for this visit.    PAST MEDICAL HISTORY: Past Medical History  Diagnosis Date  . Hypertension   . Gastroesophageal reflux   . Spinal stenosis   . Carpal tunnel syndrome, bilateral   . AC (acromioclavicular) joint bone spurs     lt shoulder  . Arthritis   . Shingles   . Anemia   . Headache     recent visit to ER @ [facility] for severe headache  . Polymyalgia ([place])   . Polyarthralgia   . Lumbar stenosis     Hx of ESIs by Dr. Nelva Bush    PAST SURGICAL HISTORY: Past Surgical History  Procedure Laterality Date  . Cholecystectomy    . Colonoscopy  06/11/2012    Procedure: COLONOSCOPY;  Surgeon: Jamesetta So, MD;  [facility];  Service: Gastroenterology;  Laterality: N/A;  . Hysteroscopy w/d&c N/A 02/25/2014    Procedure: DILATATION AND CURETTAGE /HYSTEROSCOPY;  Surgeon: Florian Buff, MD;  [facility];  Service: Gynecology;  Laterality: N/A;  . Polypectomy N/A 02/25/2014    Procedure: POLYPECTOMY;  Surgeon: Florian Buff, MD;  [facility];  Service: Gynecology;  Laterality: N/A;  . Resection distal clavical Right 03/26/2015    Procedure: OPEN DISTAL CLAVICAL RESECTION ;  Surgeon: Netta Cedars, MD;  Location: [facility];  Service: Orthopedics;  Laterality: Right;    FAMILY HISTORY: Family History  Problem Relation Age of Onset  . Hypertension Mother   . Pneumonia Father     SOCIAL HISTORY:  Social History   Social History  . Marital Status: Widowed    Spouse Name: N/A  . Number of Children: 2  . Years of Education: 1 yr coll   Occupational History  . Retired    Social History Main Topics  . Smoking status: Never Smoker   . Smokeless tobacco: Never Used  . Alcohol Use: No  . Drug Use: No  . Sexual Activity: Yes    Birth Control/ Protection: Post-menopausal   Other Topics Concern  . Not on file   Social History  Narrative   Lives at home alone.   Right-handed.   1-2 cups caffeine daily.     PHYSICAL EXAM   There were no vitals filed for this visit.  Not recorded      There is no weight on file to calculate BMI.  PHYSICAL EXAMNIATION:  Gen: NAD, conversant, well nourised, obese, well groomed                     Cardiovascular: Regular rate rhythm, no peripheral edema, warm, nontender. Eyes: Conjunctivae clear without exudates or hemorrhage Neck: Supple, no carotid bruise. Pulmonary: Clear to auscultation bilaterally   NEUROLOGICAL EXAM:  MENTAL STATUS: Speech:    Speech is normal; fluent and spontaneous with normal comprehension.  Cognition:     Orientation to time, place and person     Normal recent and remote memory     Normal Attention span and concentration  Normal Language, naming, repeating,spontaneous speech     Fund of knowledge   CRANIAL NERVES: CN II: Visual fields are full to confrontation. Fundoscopic exam is normal with sharp discs and no vascular changes. Pupils are round equal and briskly reactive to light. CN III, IV, VI: extraocular movement are normal. No ptosis. CN V: Facial sensation is intact to pinprick in all 3 divisions bilaterally. Corneal responses are intact.  CN VII: Face is symmetric with normal eye closure and smile. CN VIII: Hearing is normal to rubbing fingers CN IX, X: Palate elevates symmetrically. Phonation is normal. CN XI: Head turning and shoulder shrug are intact CN XII: Tongue is midline with normal movements and no atrophy.  MOTOR: Motor examination limited because of the pain, there was no significant bilateral upper and lower extremity proximal weakness, bilateral hands grip was normal, she has mild to moderate bilateral ankle dorsiflexion weakness, left worse than right.  REFLEXES: Reflexes are 1 and symmetric at the biceps, triceps, knees, and absent at ankles. Plantar responses are flexor.  SENSORY: Length dependent  decreased to light touch and vibratory sensation to distal shin level   Coordination: There was no no dysmetria on finger-to-nose and heel-knee-shin.    GAIT/STANCE: Need assistant to get up from seated position, antalgic, with left foot drop, unsteady,   DIAGNOSTIC DATA (LABS, IMAGING, TESTING) - I reviewed patient records, labs, notes, testing and imaging myself where available.  ASSESSMENT AND PLAN  Emeri Estill is a 70 y.o. female    Severe lumbar stenosis  Evidence of active bilateral lumbosacral radiculopathy, active denervation at bilateral lumbosacral paraspinal muscles,  I have referred her to neurosurgeon for decompression surgery,  I personally reviewed MRI of the lumbar spine 2009, there was evidence of severe stenosis at L3 and 4, expected worsening with her worsening clinical symptoms, and distal weakness         Marcial Pacas, M.D. Ph.D.  [facility] [address], [place], [place] [address] Ph: [phone] Fax: [phone]  CA:REQJ Hilma Favors, MD

## 2016-04-19 DIAGNOSIS — M150 Primary generalized (osteo)arthritis: Secondary | ICD-10-CM | POA: Diagnosis not present

## 2016-04-19 DIAGNOSIS — R2689 Other abnormalities of gait and mobility: Secondary | ICD-10-CM | POA: Diagnosis not present

## 2016-04-19 DIAGNOSIS — M21372 Foot drop, left foot: Secondary | ICD-10-CM | POA: Diagnosis not present

## 2016-04-19 DIAGNOSIS — M6281 Muscle weakness (generalized): Secondary | ICD-10-CM | POA: Diagnosis not present

## 2016-04-19 DIAGNOSIS — M4806 Spinal stenosis, lumbar region: Secondary | ICD-10-CM | POA: Diagnosis not present

## 2016-04-19 DIAGNOSIS — M353 Polymyalgia rheumatica: Secondary | ICD-10-CM | POA: Diagnosis not present

## 2016-04-20 DIAGNOSIS — M353 Polymyalgia rheumatica: Secondary | ICD-10-CM | POA: Diagnosis not present

## 2016-04-20 DIAGNOSIS — M79642 Pain in left hand: Secondary | ICD-10-CM | POA: Diagnosis not present

## 2016-04-20 DIAGNOSIS — Z79899 Other long term (current) drug therapy: Secondary | ICD-10-CM | POA: Diagnosis not present

## 2016-04-20 DIAGNOSIS — M79641 Pain in right hand: Secondary | ICD-10-CM | POA: Diagnosis not present

## 2016-04-20 DIAGNOSIS — M25562 Pain in left knee: Secondary | ICD-10-CM | POA: Diagnosis not present

## 2016-04-20 DIAGNOSIS — M2550 Pain in unspecified joint: Secondary | ICD-10-CM | POA: Diagnosis not present

## 2016-04-20 DIAGNOSIS — R5381 Other malaise: Secondary | ICD-10-CM | POA: Diagnosis not present

## 2016-04-20 DIAGNOSIS — M25561 Pain in right knee: Secondary | ICD-10-CM | POA: Diagnosis not present

## 2016-04-21 DIAGNOSIS — M150 Primary generalized (osteo)arthritis: Secondary | ICD-10-CM | POA: Diagnosis not present

## 2016-04-21 DIAGNOSIS — M21372 Foot drop, left foot: Secondary | ICD-10-CM | POA: Diagnosis not present

## 2016-04-21 DIAGNOSIS — M353 Polymyalgia rheumatica: Secondary | ICD-10-CM | POA: Diagnosis not present

## 2016-04-21 DIAGNOSIS — R2689 Other abnormalities of gait and mobility: Secondary | ICD-10-CM | POA: Diagnosis not present

## 2016-04-21 DIAGNOSIS — M6281 Muscle weakness (generalized): Secondary | ICD-10-CM | POA: Diagnosis not present

## 2016-04-21 DIAGNOSIS — M4806 Spinal stenosis, lumbar region: Secondary | ICD-10-CM | POA: Diagnosis not present

## 2016-04-27 ENCOUNTER — Ambulatory Visit
Admission: RE | Admit: 2016-04-27 | Discharge: 2016-04-27 | Disposition: A | Discharge status: Home / Self Care | Payer: Medicare Other | Source: Ambulatory Visit | Attending: Neurology | Admitting: Neurology

## 2016-04-27 DIAGNOSIS — M4806 Spinal stenosis, lumbar region: Secondary | ICD-10-CM | POA: Diagnosis not present

## 2016-04-27 DIAGNOSIS — M48061 Spinal stenosis, lumbar region without neurogenic claudication: Secondary | ICD-10-CM

## 2016-05-01 ENCOUNTER — Telehealth: Payer: Self-pay | Admitting: Neurology

## 2016-05-01 NOTE — Telephone Encounter (Signed)
I have referred her to neurosurgeon at his last visit on June 20 second 2017, she will have a follow-up visit with me in May 09 2016  IMPRESSION: Abnormal MRI scan of the lumbar spine showing severe spinal canal stenosis at L3-4 and L4-5 with bilateral severe foraminal narrowing at these levels with impingement on the exiting nerve roots. L1-L2 shows large disc osteophyte protrusion with severe foraminal compression on the right and encroachment on exiting nerve roots as well

## 2016-05-03 DIAGNOSIS — M4806 Spinal stenosis, lumbar region: Secondary | ICD-10-CM | POA: Diagnosis not present

## 2016-05-03 DIAGNOSIS — Z6841 Body Mass Index (BMI) 40.0 and over, adult: Secondary | ICD-10-CM | POA: Diagnosis not present

## 2016-05-09 ENCOUNTER — Encounter: Payer: Self-pay | Admitting: Neurology

## 2016-05-09 ENCOUNTER — Ambulatory Visit (INDEPENDENT_AMBULATORY_CARE_PROVIDER_SITE_OTHER): Payer: Medicare Other | Admitting: Neurology

## 2016-05-09 VITALS — BP 102/60 | HR 81 | Ht 61.0 in | Wt 268.0 lb

## 2016-05-09 DIAGNOSIS — R269 Unspecified abnormalities of gait and mobility: Secondary | ICD-10-CM

## 2016-05-09 DIAGNOSIS — M4806 Spinal stenosis, lumbar region: Secondary | ICD-10-CM | POA: Diagnosis not present

## 2016-05-09 DIAGNOSIS — M48061 Spinal stenosis, lumbar region without neurogenic claudication: Secondary | ICD-10-CM

## 2016-05-09 DIAGNOSIS — M2550 Pain in unspecified joint: Secondary | ICD-10-CM

## 2016-05-09 MED ORDER — GABAPENTIN 100 MG PO CAPS: 200.0000 mg | ORAL_CAPSULE | Freq: Three times a day (TID) | ORAL | Status: DC

## 2016-05-09 NOTE — Progress Notes (Addendum)
Chief Complaint  Patient presents with  . Spinal Stenosis    She saw Dr. Trenton Gammon and he has set her up for epidural steroid injections w/ Dr. Nelva Bush at [facility].  She is using a walker to assist w/ ambulation.      PATIENT: Nicole [name] DOB: 15-Nov-1945  Chief Complaint  Patient presents with  . Spinal Stenosis    She saw Dr. Trenton Gammon and he has set her up for epidural steroid injections w/ Dr. Nelva Bush at [facility].  She is using a walker to assist w/ ambulation.     HISTORICAL  Nicole [name] is a 70 years old right-handed female, accompanied by her sister-in-law Nicole [name], seen in refer by her primary care physician  Dr. Sharilyn Sites for evaluation of diffuse body achy pain, gait difficulty in May ninth 2017.  I reviewed and summarized referring note, she had a history of hypertension, hyperlipidemia, osteoarthritis, history of chronic low back pain, lumbar stenosis, has been treated by pain management Dr. Nelva Bush since 2013  She had a history of chronic low back pain, progressively worse over the past few years, radiating pain to bilateral lower extremity, left worse than right, left leg give out underneath her, prior to December 2016, she was able to ambulate independently, but had rapid decline since, she complains of diffuse body achy pain, multiple joints pain, worsening gait difficulty, has to rely on her walker is to take few steps at her house,  She denies bowel and bladder incontinence, she also complains of worsening nocturia, she is now taking Nucynta 50 mg 3 times a day, with limited help of her pain, complains of drowsiness Per record, in April 2017, she was treated with methylprednisolone 4 mg tapering dose, diclofenac 75 mg twice a day, muscle relaxant Flexeril 5 mg every night, patient reported limited help from those polypharmacy. Tramadol makes her confused, falling backwards  I reviewed laboratory evaluation in 2017, normal CBC, WBC 6.5, hemoglobin 13. 4,  normal CMP, with exception of elevated creatinine 1.1 9, elevated LDL 177, cholesterol 266, triglycerides right 165, mildly low vitamin D 25.9, urine culture was negative,  rheumatoid factor was elevated 44.5, ANA was negative, Lyme titer was negative 0.9, hepatitis function showed mild elevated ALT 39, hepatitis panel A, B, C was negative, ionized calcium was normal 5.3, alkaline phosphate was high at 120, normal TSH 1.96, cortisone level was normal folic acid was normal 8.6, vitamin B12 597, PTH 48, esr rate was 56 elevated  I was able to review her MRI of lumbar in 2009 from [facility] radiologist, severe lumbar stenosis at L 3-4, MRI of cervical spine multilevel cervical degenerative disc disease, no significant canal stenosis, no cord signal changes. MRI of the brain, mild atrophy, no acute lesions.  She lives alone, can barely maintaining independence because of her muscle pain, joints pain gait abnormality  UPDATE April 13 2016:  She came in for electrodiagnostic study today, which demonstrate active bilateral lumbar sacral radiculopathy. She has severe low back pain, limited range of motion, unsteady gait, at risk for fall,  she denies bowel and bladder incontinence,  I have referred her to neurosurgeon for lumbar decompression surgery,  UPDATE July 18th 2017: She was seen by Dr. Trenton Gammon in July, who recommended epidural injection by Dr. Nelva Bush, will try injection first, if not responsive, may consider surgery,  She was offered to have lumbar decompression surgery in 2009, she decided to hold off then.    She now complains of severe low back  pain, left side radiating pain, she has no bowel and bladder incontinence, she has significant abnormality,   We have personally reviewed MRI lumbar in July 2017: showing severe spinal canal stenosis at L3-4 and L4-5 with bilateral severe foraminal narrowing at these levels with impingement on the exiting nerve roots. L1-L2 shows large disc osteophyte protrusion  with severe foraminal compression on the right and encroachment on exiting nerve roots as well   REVIEW OF SYSTEMS: Full 14 system review of systems performed and notable only for fatigue, swelling legs, joint pain, swelling, achy muscles, feeling cold, numbness, weakness, snoring, restless leg, decreased energy  ALLERGIES: Allergies  Allergen Reactions  . Oxycontin [Oxycodone Hcl] Swelling  . Sulfa Antibiotics Other (See Comments)    Sores  . Tramadol     "Makes me feel like I am falling"  . Penicillins Rash    HOME MEDICATIONS: Current Outpatient Prescriptions  Medication Sig Dispense Refill  . diclofenac (VOLTAREN) 75 MG EC tablet Take 75 mg by mouth 2 (two) times daily.    [name] gabapentin (NEURONTIN) 100 MG capsule Take 200 mg by mouth 3 (three) times daily.     . NONFORMULARY OR COMPOUNDED ITEM Apply 1-2 application topically every 8 (eight) hours as needed (Diclofenac 3%, Lidocaine 5%, Menthol 1%     (Cream 100 Gm)).    . NUCYNTA 50 MG TABS tablet 3 (three) times daily as needed.    . pantoprazole (PROTONIX) 40 MG tablet Take 40 mg by mouth daily.     . predniSONE (DELTASONE) 5 MG tablet Two tablets daily for one week and one tablet daily for one month.    . valsartan-hydrochlorothiazide (DIOVAN-HCT) 160-25 MG per tablet Take 1 tablet by mouth daily.    . Vitamin D, Ergocalciferol, (DRISDOL) 50000 UNITS CAPS capsule Take 50,000 Units by mouth every 7 (seven) days. Takes on Wednesday     No current facility-administered medications for this visit.    PAST MEDICAL HISTORY: Past Medical History  Diagnosis Date  . Hypertension   . Gastroesophageal reflux   . Spinal stenosis   . Carpal tunnel syndrome, bilateral   . AC (acromioclavicular) joint bone spurs     lt shoulder  . Arthritis   . Shingles   . Anemia   . Headache     recent visit to ER @ [facility] for severe headache  . Polymyalgia ([place])   . Polyarthralgia   . Lumbar stenosis     Hx of ESIs by Dr. Nelva Bush     PAST SURGICAL HISTORY: Past Surgical History  Procedure Laterality Date  . Cholecystectomy    . Colonoscopy  06/11/2012    Procedure: COLONOSCOPY;  Surgeon: Jamesetta So, MD;  [facility];  Service: Gastroenterology;  Laterality: N/A;  . Hysteroscopy w/d&c N/A 02/25/2014    Procedure: DILATATION AND CURETTAGE /HYSTEROSCOPY;  Surgeon: Florian Buff, MD;  Location: [facility];  Service: Gynecology;  Laterality: N/A;  . Polypectomy N/A 02/25/2014    Procedure: POLYPECTOMY;  Surgeon: Florian Buff, MD;  Location: [facility];  Service: Gynecology;  Laterality: N/A;  . Resection distal clavical Right 03/26/2015    Procedure: OPEN DISTAL CLAVICAL RESECTION ;  Surgeon: Netta Cedars, MD;  Location: [place];  Service: Orthopedics;  Laterality: Right;    FAMILY HISTORY: Family History  Problem Relation Age of Onset  . Hypertension Mother   . Pneumonia Father     SOCIAL HISTORY:  Social History   Social History  . Marital  Status: Widowed    Spouse Name: N/A  . Number of Children: 2  . Years of Education: 1 yr coll   Occupational History  . Retired    Social History Main Topics  . Smoking status: Never Smoker   . Smokeless tobacco: Never Used  . Alcohol Use: No  . Drug Use: No  . Sexual Activity: Yes    Birth Control/ Protection: Post-menopausal   Other Topics Concern  . Not on file   Social History Narrative   Lives at home alone.   Right-handed.   1-2 cups caffeine daily.     PHYSICAL EXAM   Filed Vitals:   05/09/16 1344  BP: 102/60  Pulse: 81  Height: _0  (1.549 m)  Weight: 268 lb (121.564 kg)    Not recorded      Body mass index is 50.66 kg/(m^2).  PHYSICAL EXAMNIATION:  Gen: NAD, conversant, well nourised, obese, well groomed                     Cardiovascular: Regular rate rhythm, no peripheral edema, warm, nontender. Eyes: Conjunctivae clear without exudates or hemorrhage Neck: Supple, no carotid bruise. Pulmonary: Clear to auscultation  bilaterally   NEUROLOGICAL EXAM:  MENTAL STATUS: Speech:    Speech is normal; fluent and spontaneous with normal comprehension.  Cognition:     Orientation to time, place and person     Normal recent and remote memory     Normal Attention span and concentration     Normal Language, naming, repeating,spontaneous speech     Fund of knowledge   CRANIAL NERVES: CN II: Visual fields are full to confrontation. Fundoscopic exam is normal with sharp discs and no vascular changes. Pupils are round equal and briskly reactive to light. CN III, IV, VI: extraocular movement are normal. No ptosis. CN V: Facial sensation is intact to pinprick in all 3 divisions bilaterally. Corneal responses are intact.  CN VII: Face is symmetric with normal eye closure and smile. CN VIII: Hearing is normal to rubbing fingers CN IX, X: Palate elevates symmetrically. Phonation is normal. CN XI: Head turning and shoulder shrug are intact CN XII: Tongue is midline with normal movements and no atrophy.  MOTOR:  She has mild bilateral shoulder abduction, external rotation, grip weak, she has mild bilateral hip flexion, knee flexion extension weakness, ankle dorsiflexion weakness, left worse than right, left side 1/5, right side 4/5  REFLEXES: Reflexes are 1 and symmetric at the biceps, triceps, knees, and absent at ankles. Plantar responses are flexor.  SENSORY: Length dependent decreased to light touch and vibratory sensation to distal shin level   Coordination: There was no no dysmetria on finger-to-nose and heel-knee-shin.    GAIT/STANCE: Need assistant to get up from seated position, antalgic, with left foot drop, unsteady,   DIAGNOSTIC DATA (LABS, IMAGING, TESTING) - I reviewed patient records, labs, notes, testing and imaging myself where available.  ASSESSMENT AND PLAN  Ailah Barna is a 70 y.o. female    Severe lumbar stenosis  Evidence of active bilateral lumbosacral radiculopathy, active  denervation at bilateral lumbosacral paraspinal muscles,  She has profound bilateral lower extremity weakness, left worse than right, due to long history of severe lumbar stenosis, she has been evaluated by neurosurgeon, who has referred her for epidural injection for pain control.  Neck pain, bilateral upper extremity paresthesia and weakness  After discuss with patient, she wants to proceed with more extensive evaluation to better understand her gait abnormality,  to make decision for surgery if needed later on.  Proceed with MRI of the cervical spine to rule out cervical spondylitic myelopathy.      Marcial Pacas, M.D. Ph.D.  [facility] [address] [place], [place] [address] Ph: [phone] Fax: [phone]  DH:DIXB Hilma Favors, MD

## 2016-05-11 DIAGNOSIS — Z79899 Other long term (current) drug therapy: Secondary | ICD-10-CM | POA: Diagnosis not present

## 2016-05-11 DIAGNOSIS — Z09 Encounter for follow-up examination after completed treatment for conditions other than malignant neoplasm: Secondary | ICD-10-CM | POA: Diagnosis not present

## 2016-05-11 DIAGNOSIS — R700 Elevated erythrocyte sedimentation rate: Secondary | ICD-10-CM | POA: Diagnosis not present

## 2016-05-11 DIAGNOSIS — M2550 Pain in unspecified joint: Secondary | ICD-10-CM | POA: Diagnosis not present

## 2016-05-11 DIAGNOSIS — R899 Unspecified abnormal finding in specimens from other organs, systems and tissues: Secondary | ICD-10-CM | POA: Diagnosis not present

## 2016-05-11 DIAGNOSIS — M353 Polymyalgia rheumatica: Secondary | ICD-10-CM | POA: Diagnosis not present

## 2016-05-16 ENCOUNTER — Telehealth: Payer: Self-pay | Admitting: *Deleted

## 2016-05-16 NOTE — Telephone Encounter (Signed)
Pt medical records ready for pick up, at the front desk.

## 2016-05-20 ENCOUNTER — Inpatient Hospital Stay: Admission: RE | Admit: 2016-05-20 | Payer: Medicare Other | Source: Ambulatory Visit

## 2016-05-30 ENCOUNTER — Ambulatory Visit
Admission: RE | Admit: 2016-05-30 | Discharge: 2016-05-30 | Disposition: A | Discharge status: Home / Self Care | Payer: Medicare Other | Source: Ambulatory Visit | Attending: Neurology | Admitting: Neurology

## 2016-05-30 DIAGNOSIS — M50222 Other cervical disc displacement at C5-C6 level: Secondary | ICD-10-CM | POA: Diagnosis not present

## 2016-05-30 DIAGNOSIS — M2550 Pain in unspecified joint: Secondary | ICD-10-CM

## 2016-05-30 DIAGNOSIS — R269 Unspecified abnormalities of gait and mobility: Secondary | ICD-10-CM

## 2016-05-30 DIAGNOSIS — M48061 Spinal stenosis, lumbar region without neurogenic claudication: Secondary | ICD-10-CM

## 2016-05-31 DIAGNOSIS — M5136 Other intervertebral disc degeneration, lumbar region: Secondary | ICD-10-CM | POA: Diagnosis not present

## 2016-06-02 ENCOUNTER — Emergency Department (HOSPITAL_COMMUNITY): Payer: Medicare Other

## 2016-06-02 ENCOUNTER — Inpatient Hospital Stay (HOSPITAL_COMMUNITY)
Admission: EM | Admit: 2016-06-02 | Discharge: 2016-06-05 | DRG: 392 | Disposition: A | Discharge status: Home Health | Payer: Medicare Other | Attending: Internal Medicine | Admitting: Internal Medicine

## 2016-06-02 ENCOUNTER — Encounter (HOSPITAL_COMMUNITY): Payer: Self-pay | Admitting: Emergency Medicine

## 2016-06-02 ENCOUNTER — Inpatient Hospital Stay (HOSPITAL_COMMUNITY): Payer: Medicare Other

## 2016-06-02 DIAGNOSIS — K5732 Diverticulitis of large intestine without perforation or abscess without bleeding: Secondary | ICD-10-CM | POA: Diagnosis present

## 2016-06-02 DIAGNOSIS — G894 Chronic pain syndrome: Secondary | ICD-10-CM | POA: Diagnosis not present

## 2016-06-02 DIAGNOSIS — Z23 Encounter for immunization: Secondary | ICD-10-CM

## 2016-06-02 DIAGNOSIS — R1032 Left lower quadrant pain: Secondary | ICD-10-CM

## 2016-06-02 DIAGNOSIS — K219 Gastro-esophageal reflux disease without esophagitis: Secondary | ICD-10-CM | POA: Diagnosis present

## 2016-06-02 DIAGNOSIS — I10 Essential (primary) hypertension: Secondary | ICD-10-CM | POA: Diagnosis not present

## 2016-06-02 DIAGNOSIS — K5792 Diverticulitis of intestine, part unspecified, without perforation or abscess without bleeding: Secondary | ICD-10-CM | POA: Diagnosis present

## 2016-06-02 DIAGNOSIS — M353 Polymyalgia rheumatica: Secondary | ICD-10-CM | POA: Diagnosis present

## 2016-06-02 DIAGNOSIS — Z8249 Family history of ischemic heart disease and other diseases of the circulatory system: Secondary | ICD-10-CM

## 2016-06-02 DIAGNOSIS — Z6841 Body Mass Index (BMI) 40.0 and over, adult: Secondary | ICD-10-CM

## 2016-06-02 DIAGNOSIS — E876 Hypokalemia: Secondary | ICD-10-CM | POA: Diagnosis present

## 2016-06-02 LAB — COMPREHENSIVE METABOLIC PANEL
ALT: 25 U/L (ref 14–54)
AST: 15 U/L (ref 15–41)
Albumin: 3.7 g/dL (ref 3.5–5.0)
Alkaline Phosphatase: 105 U/L (ref 38–126)
Anion gap: 8 (ref 5–15)
BUN: 19 mg/dL (ref 6–20)
CO2: 27 mmol/L (ref 22–32)
Calcium: 9.2 mg/dL (ref 8.9–10.3)
Chloride: 101 mmol/L (ref 101–111)
Creatinine, Ser: 1.05 mg/dL — ABNORMAL HIGH (ref 0.44–1.00)
GFR calc Af Amer: >60 mL/min (ref >60)
GFR calc non Af Amer: 53 mL/min — ABNORMAL LOW (ref >60)
Glucose, Bld: 104 mg/dL — ABNORMAL HIGH (ref 65–99)
Potassium: 3.4 mmol/L — ABNORMAL LOW (ref 3.5–5.1)
Sodium: 136 mmol/L (ref 135–145)
Total Bilirubin: 0.6 mg/dL (ref 0.3–1.2)
Total Protein: 7.8 g/dL (ref 6.5–8.1)

## 2016-06-02 LAB — CBC
HCT: 39.2 % (ref 36.0–46.0)
Hemoglobin: 13.1 g/dL (ref 12.0–15.0)
MCH: 29.4 pg (ref 26.0–34.0)
MCHC: 33.4 g/dL (ref 30.0–36.0)
MCV: 87.9 fL (ref 78.0–100.0)
Platelets: 350 10*3/uL (ref 150–400)
RBC: 4.46 MIL/uL (ref 3.87–5.11)
RDW: 13.9 % (ref 11.5–15.5)
WBC: 19.9 10*3/uL — ABNORMAL HIGH (ref 4.0–10.5)

## 2016-06-02 LAB — LIPASE, BLOOD: Lipase: 27 U/L (ref 11–51)

## 2016-06-02 LAB — URINALYSIS, ROUTINE W REFLEX MICROSCOPIC
Bilirubin Urine: NEGATIVE
Glucose, UA: NEGATIVE mg/dL
Hgb urine dipstick: NEGATIVE
Ketones, ur: NEGATIVE mg/dL
Leukocytes, UA: NEGATIVE
Nitrite: NEGATIVE
Protein, ur: NEGATIVE mg/dL
Specific Gravity, Urine: >1.03 — ABNORMAL HIGH (ref 1.005–1.030)
pH: 5.5 (ref 5.0–8.0)

## 2016-06-02 LAB — LACTIC ACID, PLASMA
Lactic Acid, Venous: 1.3 mmol/L (ref 0.5–1.9)
Lactic Acid, Venous: 2.5 mmol/L (ref 0.5–1.9)

## 2016-06-02 MED ORDER — FENTANYL CITRATE (PF) 100 MCG/2ML IJ SOLN
25.0000 ug | INTRAMUSCULAR | Status: DC | PRN
  Administered 2016-06-02: 25 ug via INTRAVENOUS

## 2016-06-02 MED ORDER — PANTOPRAZOLE SODIUM 40 MG PO TBEC
40.0000 mg | DELAYED_RELEASE_TABLET | Freq: Every day | ORAL | Status: DC
  Administered 2016-06-02 – 2016-06-05 (×4): 40 mg via ORAL
  Filled 2016-06-02 (×4): qty 1

## 2016-06-02 MED ORDER — METRONIDAZOLE IN NACL 5-0.79 MG/ML-% IV SOLN
500.0000 mg | Freq: Once | INTRAVENOUS | Status: AC
  Administered 2016-06-02: 500 mg via INTRAVENOUS
  Filled 2016-06-02: qty 100

## 2016-06-02 MED ORDER — ONDANSETRON HCL 4 MG/2ML IJ SOLN
4.0000 mg | Freq: Four times a day (QID) | INTRAMUSCULAR | Status: DC | PRN
  Administered 2016-06-02 – 2016-06-03 (×3): 4 mg via INTRAVENOUS
  Filled 2016-06-02 (×3): qty 2

## 2016-06-02 MED ORDER — IOPAMIDOL (ISOVUE-300) INJECTION 61%
100.0000 mL | Freq: Once | INTRAVENOUS | Status: AC | PRN
  Administered 2016-06-02: 100 mL via INTRAVENOUS

## 2016-06-02 MED ORDER — SODIUM CHLORIDE 0.9 % IV SOLN: INTRAVENOUS | Status: DC

## 2016-06-02 MED ORDER — ACETAMINOPHEN 325 MG PO TABS: 650.0000 mg | ORAL_TABLET | Freq: Four times a day (QID) | ORAL | Status: DC | PRN

## 2016-06-02 MED ORDER — SODIUM CHLORIDE 0.9 % IV SOLN
INTRAVENOUS | Status: DC
  Administered 2016-06-02 (×2): via INTRAVENOUS

## 2016-06-02 MED ORDER — ONDANSETRON HCL 4 MG/2ML IJ SOLN
4.0000 mg | INTRAMUSCULAR | Status: DC | PRN
Start: 2016-06-02 — End: 2016-06-02

## 2016-06-02 MED ORDER — CIPROFLOXACIN IN D5W 400 MG/200ML IV SOLN
400.0000 mg | Freq: Two times a day (BID) | INTRAVENOUS | Status: DC
  Administered 2016-06-02 – 2016-06-05 (×6): 400 mg via INTRAVENOUS
  Filled 2016-06-02 (×6): qty 200

## 2016-06-02 MED ORDER — HYDROMORPHONE HCL 1 MG/ML IJ SOLN
1.0000 mg | INTRAMUSCULAR | Status: DC | PRN
  Administered 2016-06-02: 1 mg via INTRAVENOUS
  Administered 2016-06-02: 2 mg via INTRAVENOUS
  Administered 2016-06-03 (×3): 1 mg via INTRAVENOUS
  Filled 2016-06-02: qty 1
  Filled 2016-06-02: qty 2
  Filled 2016-06-02 (×4): qty 1
  Filled 2016-06-02: qty 2

## 2016-06-02 MED ORDER — METRONIDAZOLE IN NACL 5-0.79 MG/ML-% IV SOLN
500.0000 mg | Freq: Three times a day (TID) | INTRAVENOUS | Status: DC
  Administered 2016-06-02 – 2016-06-05 (×9): 500 mg via INTRAVENOUS
  Filled 2016-06-02 (×9): qty 100

## 2016-06-02 MED ORDER — TAPENTADOL HCL 50 MG PO TABS
50.0000 mg | ORAL_TABLET | Freq: Three times a day (TID) | ORAL | Status: DC | PRN
  Administered 2016-06-03 – 2016-06-05 (×5): 50 mg via ORAL

## 2016-06-02 MED ORDER — FENTANYL CITRATE (PF) 100 MCG/2ML IJ SOLN
25.0000 ug | INTRAMUSCULAR | Status: AC | PRN
  Administered 2016-06-02 (×2): 25 ug via INTRAVENOUS
  Filled 2016-06-02: qty 2

## 2016-06-02 MED ORDER — DIATRIZOATE MEGLUMINE & SODIUM 66-10 % PO SOLN
ORAL | Status: AC
  Filled 2016-06-02: qty 30

## 2016-06-02 MED ORDER — ONDANSETRON HCL 4 MG PO TABS: 4.0000 mg | ORAL_TABLET | Freq: Four times a day (QID) | ORAL | Status: DC | PRN

## 2016-06-02 MED ORDER — CIPROFLOXACIN IN D5W 400 MG/200ML IV SOLN
400.0000 mg | Freq: Once | INTRAVENOUS | Status: AC
  Administered 2016-06-02: 400 mg via INTRAVENOUS
  Filled 2016-06-02: qty 200

## 2016-06-02 MED ORDER — PNEUMOCOCCAL VAC POLYVALENT 25 MCG/0.5ML IJ INJ: 0.5000 mL | INJECTION | INTRAMUSCULAR | Status: DC

## 2016-06-02 MED ORDER — ACETAMINOPHEN 650 MG RE SUPP: 650.0000 mg | Freq: Four times a day (QID) | RECTAL | Status: DC | PRN

## 2016-06-02 MED ORDER — ENOXAPARIN SODIUM 60 MG/0.6ML ~~LOC~~ SOLN
60.0000 mg | SUBCUTANEOUS | Status: DC
  Administered 2016-06-02 – 2016-06-04 (×3): 60 mg via SUBCUTANEOUS
  Filled 2016-06-02 (×3): qty 0.6

## 2016-06-02 MED ORDER — GABAPENTIN 100 MG PO CAPS
200.0000 mg | ORAL_CAPSULE | Freq: Three times a day (TID) | ORAL | Status: DC
  Administered 2016-06-02 – 2016-06-05 (×8): 200 mg via ORAL
  Filled 2016-06-02 (×9): qty 2

## 2016-06-02 MED ORDER — POTASSIUM CHLORIDE IN NACL 20-0.9 MEQ/L-% IV SOLN
INTRAVENOUS | Status: DC
  Administered 2016-06-02 – 2016-06-05 (×6): via INTRAVENOUS

## 2016-06-02 MED ORDER — ONDANSETRON HCL 4 MG/2ML IJ SOLN
4.0000 mg | INTRAMUSCULAR | Status: AC | PRN
  Administered 2016-06-02 (×2): 4 mg via INTRAVENOUS
  Filled 2016-06-02 (×2): qty 2

## 2016-06-02 NOTE — ED Notes (Signed)
Floor unable to take reoprt at this time

## 2016-06-02 NOTE — ED Provider Notes (Signed)
[place] DEPT Provider Note   CSN: [id number] Arrival date & time: 06/02/16  0908  First Provider Contact:  None       History   Chief Complaint Chief Complaint  Patient presents with  . Abdominal Pain    HPI Nicole [name] is a 70 y.o. female.  HPI  Pt was seen at 0930.  Per pt, c/o gradual onset and worsening of constant LLQ abd "pain" since yesterday.  Has been associated with nausea and one "loose stool." Describes the abd pain as "hurting."  Describes the stools as "loose" and "brown." Denies vomiting/diarrhea, no fevers, no back pain, no rash, no CP/SOB, no black or blood in stools.      Past Medical History:  Diagnosis Date  . AC (acromioclavicular) joint bone spurs    lt shoulder  . Anemia   . Arthritis   . Carpal tunnel syndrome, bilateral   . Gastroesophageal reflux   . Headache    recent visit to ER @ [facility] for severe headache  . Hypertension   . Lumbar stenosis    Hx of ESIs by Dr. Nelva Bush  . Polyarthralgia   . Polymyalgia ([place])   . Shingles   . Spinal stenosis     Patient Active Problem List   Diagnosis Date Noted  . Spinal stenosis of lumbar region 02/29/2016  . Joint pain 02/29/2016  . Endometrial polyp 02/06/2014  . Postmenopausal bleeding 01/30/2014  . GERD (gastroesophageal reflux disease) 09/29/2013  . Spinal stenosis, thoracic 09/29/2013  . Shingles 09/29/2013  . Thoracic or lumbosacral neuritis or radiculitis, unspecified 05/04/2011  . Abnormality of gait 05/04/2011  . Muscle weakness (generalized) 05/04/2011  . KNEE, ARTHRITIS, DEGEN./OSTEO 04/07/2009  . KNEE PAIN 04/07/2009    Past Surgical History:  Procedure Laterality Date  . CHOLECYSTECTOMY    . COLONOSCOPY  06/11/2012   Procedure: COLONOSCOPY;  Surgeon: Jamesetta So, MD;  [facility];  Service: Gastroenterology;  Laterality: N/A;  . HYSTEROSCOPY W/D&C N/A 02/25/2014   Procedure: DILATATION AND CURETTAGE /HYSTEROSCOPY;  Surgeon: Florian Buff, MD;  [facility];  Service: Gynecology;  Laterality: N/A;  . POLYPECTOMY N/A 02/25/2014   Procedure: POLYPECTOMY;  Surgeon: Florian Buff, MD;  Location: [facility];  Service: Gynecology;  Laterality: N/A;  . RESECTION DISTAL CLAVICAL Right 03/26/2015   Procedure: OPEN DISTAL CLAVICAL RESECTION ;  Surgeon: Netta Cedars, MD;  Location: [place];  Service: Orthopedics;  Laterality: Right;       Home Medications    Prior to Admission medications   Medication Sig Start Date End Date Taking? Authorizing Provider  diclofenac (VOLTAREN) 75 MG EC tablet Take 75 mg by mouth 2 (two) times daily.    Historical Provider, MD  gabapentin (NEURONTIN) 100 MG capsule Take 2 capsules (200 mg total) by mouth 3 (three) times daily. 05/09/16   Marcial Pacas, MD  NONFORMULARY OR COMPOUNDED ITEM Apply 1-2 application topically every 8 (eight) hours as needed (Diclofenac 3%, Lidocaine 5%, Menthol 1%     (Cream 100 Gm)).    Historical Provider, MD  NUCYNTA 50 MG TABS tablet 3 (three) times daily as needed. 02/14/16   Historical Provider, MD  pantoprazole (PROTONIX) 40 MG tablet Take 40 mg by mouth daily.  08/20/13   Milton Ferguson, MD  predniSONE (DELTASONE) 5 MG tablet Two tablets daily for one week and one tablet daily for one month. 04/20/16   Historical Provider, MD  valsartan-hydrochlorothiazide (DIOVAN-HCT) 160-25 MG per tablet Take 1 tablet  by mouth daily.    Historical Provider, MD  Vitamin D, Ergocalciferol, (DRISDOL) 50000 UNITS CAPS capsule Take 50,000 Units by mouth every 7 (seven) days. Takes on Wednesday    Historical Provider, MD    Family History Family History  Problem Relation Age of Onset  . Hypertension Mother   . Pneumonia Father     Social History Social History  Substance Use Topics  . Smoking status: Never Smoker  . Smokeless tobacco: Never Used  . Alcohol use No     Allergies   Oxycontin [oxycodone hcl]; Sulfa antibiotics; Tramadol; and Penicillins   Review of Systems Review of Systems ROS:  Statement: All systems negative except as marked or noted in the HPI; Constitutional: Negative for fever and chills. ; ; Eyes: Negative for eye pain, redness and discharge. ; ; ENMT: Negative for ear pain, hoarseness, nasal congestion, sinus pressure and sore throat. ; ; Cardiovascular: Negative for chest pain, palpitations, diaphoresis, dyspnea and peripheral edema. ; ; Respiratory: Negative for cough, wheezing and stridor. ; ; Gastrointestinal: +nausea, abd pain. Negative for vomiting, blood in stool, hematemesis, jaundice and rectal bleeding. . ; ; Genitourinary: Negative for dysuria, flank pain and hematuria. ; ; Musculoskeletal: Negative for back pain and neck pain. Negative for swelling and trauma.; ; Skin: Negative for pruritus, rash, abrasions, blisters, bruising and skin lesion.; ; Neuro: Negative for headache, lightheadedness and neck stiffness. Negative for weakness, altered level of consciousness, altered mental status, extremity weakness, paresthesias, involuntary movement, seizure and syncope.       Physical Exam Updated Vital Signs BP 138/73 (BP Location: Left Arm)   Pulse 82   Temp 97.9 F (36.6 C) (Oral)   Resp 20   Ht 5\' 1"  (1.549 m)   Wt 259 lb (117.5 kg)   SpO2 97%   BMI 48.94 kg/m   Physical Exam 0935: Physical examination:  Nursing notes reviewed; Vital signs and O2 SAT reviewed;  Constitutional: Well developed, Well nourished, Well hydrated, Uncomfortable appearing.; Head:  Normocephalic, atraumatic; Eyes: EOMI, PERRL, No scleral icterus; ENMT: Mouth and pharynx normal, Mucous membranes moist; Neck: Supple, Full range of motion, No lymphadenopathy; Cardiovascular: Regular rate and rhythm, No gallop; Respiratory: Breath sounds clear & equal bilaterally, No wheezes.  Speaking full sentences with ease, Normal respiratory effort/excursion; Chest: Nontender, Movement normal; Abdomen: Soft, +LLQ tenderness to palp. Nondistended, Normal bowel sounds; Genitourinary: No CVA  tenderness; Extremities: Pulses normal, No tenderness, No edema, No calf edema or asymmetry.; Neuro: AA&Ox3, Major CN grossly intact.  Speech clear. No gross focal motor or sensory deficits in extremities.; Skin: Color normal, Warm, Dry.    ED Treatments / Results  Labs (all labs ordered are listed, but only abnormal results are displayed)   EKG  EKG Interpretation None       Radiology   Procedures Procedures (including critical care time)  Medications Ordered in ED Medications  0.9 %  sodium chloride infusion ( Intravenous New Bag/Given 06/02/16 0959)  ondansetron (ZOFRAN) injection 4 mg (4 mg Intravenous Given 06/02/16 0958)  fentaNYL (SUBLIMAZE) injection 25 mcg (25 mcg Intravenous Given 06/02/16 0958)     Initial Impression / Assessment and Plan / ED Course  I have reviewed the triage vital signs and the nursing notes.  Pertinent labs & imaging results that were available during my care of the patient were reviewed by me and considered in my medical decision making (see chart for details).  MDM Reviewed: previous chart, nursing note and vitals Reviewed previous: labs Interpretation:  labs and CT scan   Results for orders placed or performed during the hospital encounter of 06/02/16  Lipase, blood  Result Value Ref Range   Lipase 27 11 - 51 U/L  Comprehensive metabolic panel  Result Value Ref Range   Sodium 136 135 - 145 mmol/L   Potassium 3.4 (L) 3.5 - 5.1 mmol/L   Chloride 101 101 - 111 mmol/L   CO2 27 22 - 32 mmol/L   Glucose, Bld 104 (H) 65 - 99 mg/dL   BUN 19 6 - 20 mg/dL   Creatinine, Ser 1.05 (H) 0.44 - 1.00 mg/dL   Calcium 9.2 8.9 - 10.3 mg/dL   Total Protein 7.8 6.5 - 8.1 g/dL   Albumin 3.7 3.5 - 5.0 g/dL   AST 15 15 - 41 U/L   ALT 25 14 - 54 U/L   Alkaline Phosphatase 105 38 - 126 U/L   Total Bilirubin 0.6 0.3 - 1.2 mg/dL   GFR calc non Af Amer 53 (L) >60 mL/min   GFR calc Af Amer >60 >60 mL/min   Anion gap 8 5 - 15  CBC  Result Value Ref  Range   WBC 19.9 (H) 4.0 - 10.5 K/uL   RBC 4.46 3.87 - 5.11 MIL/uL   Hemoglobin 13.1 12.0 - 15.0 g/dL   HCT 39.2 36.0 - 46.0 %   MCV 87.9 78.0 - 100.0 fL   MCH 29.4 26.0 - 34.0 pg   MCHC 33.4 30.0 - 36.0 g/dL   RDW 13.9 11.5 - 15.5 %   Platelets 350 150 - 400 K/uL  Urinalysis, Routine w reflex microscopic  Result Value Ref Range   Color, Urine YELLOW YELLOW   APPearance CLEAR CLEAR   Specific Gravity, Urine >1.030 (H) 1.005 - 1.030   pH 5.5 5.0 - 8.0   Glucose, UA NEGATIVE NEGATIVE mg/dL   Hgb urine dipstick NEGATIVE NEGATIVE   Bilirubin Urine NEGATIVE NEGATIVE   Ketones, ur NEGATIVE NEGATIVE mg/dL   Protein, ur NEGATIVE NEGATIVE mg/dL   Nitrite NEGATIVE NEGATIVE   Leukocytes, UA NEGATIVE NEGATIVE  Lactic acid, plasma  Result Value Ref Range   Lactic Acid, Venous 1.3 0.5 - 1.9 mmol/L    Ct Abdomen Pelvis W Contrast Result Date: 06/02/2016 CLINICAL DATA:  69 year old female with a history of left lower quadrant pain EXAM: CT ABDOMEN AND PELVIS WITH CONTRAST TECHNIQUE: Multidetector CT imaging of the abdomen and pelvis was performed using the standard protocol following bolus administration of intravenous contrast. CONTRAST:  17mL ISOVUE-300 IOPAMIDOL (ISOVUE-300) INJECTION 61% COMPARISON:  CT 08/20/2013 FINDINGS: Lower chest: Unremarkable appearance of the soft tissues of the chest wall. Heart size within normal limits.  No pericardial fluid/thickening. Calcifications in the distribution the right coronary artery and the circumflex coronary artery. No lower mediastinal adenopathy. Unremarkable appearance of the distal esophagus. Hiatal hernia No confluent airspace disease, pleural fluid, or pneumothorax within visualized lung. Abdomen/pelvis: Unremarkable appearance of liver and spleen. Unremarkable appearance of bilateral adrenal glands. No peripancreatic fluid or inflammatory changes. Cholecystectomy No intrahepatic or extrahepatic biliary ductal dilatation. No intra-peritoneal free  air or significant free-fluid. Unremarkable stomach, small bowel. Inflammatory changes involving the descending colon with diverticular disease. No focal fluid collection or extraluminal gas. No evidence of obstruction. Appendix is not visualized, however, no inflammatory changes are present adjacent to the cecum to indicate an appendicitis. Right Kidney/Ureter: No hydronephrosis. No nephrolithiasis. No perinephric stranding. Unremarkable course of the right ureter. Left Kidney/Ureter: No hydronephrosis. No nephrolithiasis. No perinephric stranding. Unremarkable course  of the left ureter. Unremarkable appearance of the urinary bladder. Unremarkable appearance of the uterus. Soft tissue associate with the left adnexa measures 4.0 cm. This is not significantly changed from the comparison CT. Unremarkable appearance of the right adnexa. Calcifications of the abdominal aorta. Calcifications of the bilateral iliac system. Musculoskeletal: No displaced fracture Multilevel degenerative changes of the spine with vacuum disc phenomenon throughout the lower thoracic and lumbar levels. IMPRESSION: Acute diverticulitis of the descending colon/proximal sigmoid colon without evidence of abscess. These results were called by telephone at the time of interpretation on 06/02/2016 at 11:46 am to Dr. Francine Graven , who verbally acknowledged these results. Aortic atherosclerosis. Nodular tissue associated with the left adnexa, measuring 4 cm. Given the patient's age, referral for GYN evaluation is warranted. Signed, Dulcy Fanny. Earleen Newport, DO [facility] Electronically Signed   By: Corrie Mckusick D.O.   On: 06/02/2016 11:49     1150:   T/C from [facility] MD: pt with acute diverticulitis. Will start IV cipro/flagyl. Dx and testing d/w pt and family.  Questions answered.  Verb understanding, agreeable to admit.  T/C to [facility] Dr. Roderic Palau, case discussed, including:  HPI, pertinent PM/SHx,  VS/PE, dx testing, ED course and treatment:  Agreeable to admit, requests to write temporary orders, obtain inpt medical bed to team [facility].    Final Clinical Impressions(s) / ED Diagnoses   Final diagnoses:  None    New Prescriptions New Prescriptions   No medications on file     Francine Graven, DO 06/03/16 0732

## 2016-06-02 NOTE — ED Notes (Signed)
CRITICAL VALUE ALERT  Critical value received:  Lactic acid 2.5  Date of notification:  06/02/2016  Time of notification: 1402    Nurse who received alert:  JJ Annalysa Mohammad  Dr. Roderic Palau notified while still in the er

## 2016-06-02 NOTE — ED Triage Notes (Signed)
Pt having LLQ pain since this morning with nausea. Denies v/d or urinary symptoms.

## 2016-06-02 NOTE — H&P (Addendum)
History and Physical    [name] [id number] DOB: 07/31/1946 DOA: 06/02/2016  Referring MD/NP/PA: Francine Graven, DO  PCP: Purvis Kilts, MD  Outpatient Specialists:   Neurosurgery: Earnie Larsson, MD  Rhematology: Bo Merino, MD  Physical Medicine and Rehabilitation: Suella Broad, MD  Patient coming from: Home  Chief Complaint: Abdominal Pain  HPI: Nicole [name] is a 70 y.o. female presents with complaints of gradual worsening LLQ abd pain that onset earlier this morning around 1 am . She had an episode of vomiting in the ER. She has not had any diarrhea or fever. She has significant SOB. No dysuria. Pain appears to be located in the LLQ and non radiating. Pain is quite severe. Pt has a hx of diverticulitis with her last episode being 4 years ago.  ED Course: While in the Ed, lactic acid 2.5, potassium 3.4, Cr. 1.05, WBC 19.9, UA unremarkable. CT a/p revealed Acute diverticulitis of the descending colon/proximal sigmoid colon without evidence of abscess. He was admitted for acute diverticulitis.   Review of Systems: As per HPI otherwise 10 point review of systems negative.    Past Medical History:  Diagnosis Date  . AC (acromioclavicular) joint bone spurs    lt shoulder  . Anemia   . Arthritis   . Carpal tunnel syndrome, bilateral   . Gastroesophageal reflux   . Headache    recent visit to ER @ [facility] for severe headache  . Hypertension   . Lumbar stenosis    Hx of ESIs by Dr. Nelva Bush  . Polyarthralgia   . Polymyalgia ([place])   . Shingles   . Spinal stenosis     Past Surgical History:  Procedure Laterality Date  . CHOLECYSTECTOMY    . COLONOSCOPY  06/11/2012   Procedure: COLONOSCOPY;  Surgeon: Jamesetta So, MD;  Location: [facility];  Service: Gastroenterology;  Laterality: N/A;  . HYSTEROSCOPY W/D&C N/A 02/25/2014   Procedure: DILATATION AND CURETTAGE /HYSTEROSCOPY;  Surgeon: Florian Buff, MD;  Location: [facility];  Service: Gynecology;   Laterality: N/A;  . POLYPECTOMY N/A 02/25/2014   Procedure: POLYPECTOMY;  Surgeon: Florian Buff, MD;  Location: [facility];  Service: Gynecology;  Laterality: N/A;  . RESECTION DISTAL CLAVICAL Right 03/26/2015   Procedure: OPEN DISTAL CLAVICAL RESECTION ;  Surgeon: Netta Cedars, MD;  Location: [place];  Service: Orthopedics;  Laterality: Right;     reports that she has never smoked. She has never used smokeless tobacco. She reports that she does not drink alcohol or use drugs.  Allergies  Allergen Reactions  . Oxycontin [Oxycodone Hcl] Swelling  . Sulfa Antibiotics Other (See Comments)    Sores  . Tramadol     "Makes me feel like I am falling"  . Penicillins Rash    Family History  Problem Relation Age of Onset  . Hypertension Mother   . Pneumonia Father     Prior to Admission medications   Medication Sig Start Date End Date Taking? Authorizing Provider  diclofenac (VOLTAREN) 75 MG EC tablet Take 75 mg by mouth 2 (two) times daily.   Yes Historical Provider, MD  gabapentin (NEURONTIN) 100 MG capsule Take 2 capsules (200 mg total) by mouth 3 (three) times daily. 05/09/16  Yes Marcial Pacas, MD  NONFORMULARY OR COMPOUNDED ITEM Apply 1-2 application topically every 8 (eight) hours as needed (Diclofenac 3%, Lidocaine 5%, Menthol 1%     (Cream 100 Gm)).   Yes Historical Provider, MD  NUCYNTA 50 MG TABS tablet  Take 50 mg by mouth 3 (three) times daily as needed for moderate pain.  02/14/16  Yes Historical Provider, MD  pantoprazole (PROTONIX) 40 MG tablet Take 40 mg by mouth daily.  08/20/13  Yes Milton Ferguson, MD  predniSONE (DELTASONE) 5 MG tablet Two tablets daily for one week and one tablet daily for one month. 04/20/16  Yes Historical Provider, MD  valsartan-hydrochlorothiazide (DIOVAN-HCT) 160-25 MG per tablet Take 1 tablet by mouth daily.   Yes Historical Provider, MD  Vitamin D, Ergocalciferol, (DRISDOL) 50000 UNITS CAPS capsule Take 50,000 Units by mouth every 7 (seven) days. Takes on Wednesday    Yes Historical Provider, MD    Physical Exam: Vitals:   06/02/16 0916 06/02/16 1139  BP: 138/73 114/80  Pulse: 82 95  Resp: 20 18  Temp: 97.9 F (36.6 C)   TempSrc: Oral   SpO2: 97% 96%  Weight: 117.5 kg (259 lb)   Height: 5\' 1"  (1.549 m)        Vitals:   06/02/16 0916 06/02/16 1139  BP: 138/73 114/80  Pulse: 82 95  Resp: 20 18  Temp: 97.9 F (36.6 C)   TempSrc: Oral   SpO2: 97% 96%  Weight: 117.5 kg (259 lb)   Height: 5\' 1"  (1.549 m)    Constitutional: NAD, calm, comfortable Eyes: PERRL, lids and conjunctivae normal ENMT: Mucous membranes are moist. Posterior pharynx clear of any exudate or lesions.Normal dentition.  Neck: normal, supple, no masses, no thyromegaly Respiratory: clear to auscultation bilaterally, no wheezing, no crackles. Normal respiratory effort. No accessory muscle use.  Cardiovascular: Regular rate and rhythm, no murmurs / rubs / gallops. No extremity edema. 2+ pedal pulses. No carotid bruits.  Abdomen: no masses palpated. No hepatosplenomegaly. Bowel sounds positive. Pt is exquisitely tender in the LLQ. Musculoskeletal: no clubbing / cyanosis. No joint deformity upper and lower extremities. Good ROM, no contractures. Normal muscle tone.  Skin: no rashes, lesions, ulcers. No induration Neurologic: CN 2-12 grossly intact. Sensation intact, DTR normal. Strength 5/5 in all 4.  Psychiatric: Normal judgment and insight. Alert and oriented x 3. Normal mood.   Labs on Admission: I have personally reviewed following labs and imaging studies  CBC:  Recent Labs Lab 06/02/16 0917  WBC 19.9*  HGB 13.1  HCT 39.2  MCV 87.9  PLT [id number]   Basic Metabolic Panel:  Recent Labs Lab 06/02/16 0917  NA 136  K 3.4*  CL 101  CO2 27  GLUCOSE 104*  BUN 19  CREATININE 1.05*  CALCIUM 9.2   GFR: Estimated Creatinine Clearance: 59.6 mL/min (by C-G formula based on SCr of 1.05 mg/dL). Liver Function Tests:  Recent Labs Lab 06/02/16 0917  AST 15  ALT 25   ALKPHOS 105  BILITOT 0.6  PROT 7.8  ALBUMIN 3.7    Recent Labs Lab 06/02/16 0917  LIPASE 27   No results for input(s): AMMONIA in the last 168 hours. Coagulation Profile: No results for input(s): INR, PROTIME in the last 168 hours. Cardiac Enzymes: No results for input(s): CKTOTAL, CKMB, CKMBINDEX, TROPONINI in the last 168 hours. BNP (last 3 results) No results for input(s): PROBNP in the last 8760 hours. HbA1C: No results for input(s): HGBA1C in the last 72 hours. CBG: No results for input(s): GLUCAP in the last 168 hours. Lipid Profile: No results for input(s): CHOL, HDL, LDLCALC, TRIG, CHOLHDL, LDLDIRECT in the last 72 hours. Thyroid Function Tests: No results for input(s): TSH, T4TOTAL, FREET4, T3FREE, THYROIDAB in the last 72 hours. Anemia  Panel: No results for input(s): VITAMINB12, FOLATE, FERRITIN, TIBC, IRON, RETICCTPCT in the last 72 hours. Urine analysis:    Component Value Date/Time   COLORURINE YELLOW 06/02/2016 0933   APPEARANCEUR CLEAR 06/02/2016 0933   LABSPEC >1.030 (H) 06/02/2016 0933   PHURINE 5.5 06/02/2016 0933   GLUCOSEU NEGATIVE 06/02/2016 0933   HGBUR NEGATIVE 06/02/2016 0933   BILIRUBINUR NEGATIVE 06/02/2016 0933   KETONESUR NEGATIVE 06/02/2016 0933   PROTEINUR NEGATIVE 06/02/2016 0933   UROBILINOGEN 0.2 02/20/2014 1116   NITRITE NEGATIVE 06/02/2016 0933   LEUKOCYTESUR NEGATIVE 06/02/2016 0933   Sepsis Labs: @LABRCNTIP (procalcitonin:4,lacticidven:4) )No results found for this or any previous visit (from the past 240 hour(s)).   Radiological Exams on Admission: Ct Abdomen Pelvis W Contrast  Result Date: 06/02/2016 CLINICAL DATA:  70 year old female with a history of left lower quadrant pain EXAM: CT ABDOMEN AND PELVIS WITH CONTRAST TECHNIQUE: Multidetector CT imaging of the abdomen and pelvis was performed using the standard protocol following bolus administration of intravenous contrast. CONTRAST:  127mL ISOVUE-300 IOPAMIDOL (ISOVUE-300)  INJECTION 61% COMPARISON:  CT 08/20/2013 FINDINGS: Lower chest: Unremarkable appearance of the soft tissues of the chest wall. Heart size within normal limits.  No pericardial fluid/thickening. Calcifications in the distribution the right coronary artery and the circumflex coronary artery. No lower mediastinal adenopathy. Unremarkable appearance of the distal esophagus. Hiatal hernia No confluent airspace disease, pleural fluid, or pneumothorax within visualized lung. Abdomen/pelvis: Unremarkable appearance of liver and spleen. Unremarkable appearance of bilateral adrenal glands. No peripancreatic fluid or inflammatory changes. Cholecystectomy No intrahepatic or extrahepatic biliary ductal dilatation. No intra-peritoneal free air or significant free-fluid. Unremarkable stomach, small bowel. Inflammatory changes involving the descending colon with diverticular disease. No focal fluid collection or extraluminal gas. No evidence of obstruction. Appendix is not visualized, however, no inflammatory changes are present adjacent to the cecum to indicate an appendicitis. Right Kidney/Ureter: No hydronephrosis. No nephrolithiasis. No perinephric stranding. Unremarkable course of the right ureter. Left Kidney/Ureter: No hydronephrosis. No nephrolithiasis. No perinephric stranding. Unremarkable course of the left ureter. Unremarkable appearance of the urinary bladder. Unremarkable appearance of the uterus. Soft tissue associate with the left adnexa measures 4.0 cm. This is not significantly changed from the comparison CT. Unremarkable appearance of the right adnexa. Calcifications of the abdominal aorta. Calcifications of the bilateral iliac system. Musculoskeletal: No displaced fracture Multilevel degenerative changes of the spine with vacuum disc phenomenon throughout the lower thoracic and lumbar levels. IMPRESSION: Acute diverticulitis of the descending colon/proximal sigmoid colon without evidence of abscess. These  results were called by telephone at the time of interpretation on 06/02/2016 at 11:46 am to Dr. Francine Graven , who verbally acknowledged these results. Aortic atherosclerosis. Nodular tissue associated with the left adnexa, measuring 4 cm. Given the patient's age, referral for GYN evaluation is warranted. Signed, Dulcy Fanny. Earleen Newport, DO [facility] Radiology Electronically Signed   By: Corrie Mckusick D.O.   On: 06/02/2016 11:49     Assessment/Plan Active Problems:   Acute diverticulitis  1. Acute diverticulitis. WBC 19.9. Lactic Acid 2.5. CT a/p revealed acute diverticulitis of the descending colon/proximal sigmoid colon without evidence of abscess.  Pt started on Cipro and flagyl. Keep pt NPO for now except meds. Continue with IV hydration. Advanced diet is tolerated. Continue to monitor. Hold NSAIDS for now. 2. HTN. Blood pressure is stable. Hold off on oral medications until bowel issues have improved. 3. Chronic pain. Pt chronically takes nucynta. Will continue long acting pain meds. Will use  IV pain meds for breakthrough pain. 4. Hypokalemia. Stable. Start on potassium supplementation  5. GERD. Continue home regimen of protonix. 6. Left adnexal nodule. Check pelvic ultrasound   DVT prophylaxis: Lovenox Code Status: Full Family Communication: Daughter in law at bedside. Disposition Plan: Discharge home once improved. Consults called: None Admission status: Admit as inpatient.   Kathie Dike, MD [facility] If 7PM-7AM, please contact night-coverage [email or web address] Password [email or web address]  06/02/2016, 12:48 PM  By signing my name below, I, Hilbert Odor, attest that this documentation has been prepared under the direction and in the presence of Kathie Dike, MD. Electronically signed: Hilbert Odor, Scribe.  06/02/16, 2:25 PM  I, Dr. Kathie Dike, personally performed the services described in this documentaiton. All medical  record entries made by the scribe were at my direction and in my presence. I have reviewed the chart and agree that the record reflects my personal performance and is accurate and complete  Kathie Dike, MD, 06/02/2016 2:52 PM

## 2016-06-02 NOTE — Progress Notes (Signed)
Patient arrived to unit. VSS. Patient is 10/10 pain. Medication given. Pt resting Order/chart reviewed. RN will continue to monitor.

## 2016-06-02 NOTE — ED Notes (Signed)
Pt taken to ct 

## 2016-06-02 NOTE — ED Notes (Signed)
Pt returned from ct

## 2016-06-03 LAB — BASIC METABOLIC PANEL
Anion gap: 6 (ref 5–15)
BUN: 16 mg/dL (ref 6–20)
CO2: 27 mmol/L (ref 22–32)
Calcium: 8.3 mg/dL — ABNORMAL LOW (ref 8.9–10.3)
Chloride: 100 mmol/L — ABNORMAL LOW (ref 101–111)
Creatinine, Ser: 1.06 mg/dL — ABNORMAL HIGH (ref 0.44–1.00)
GFR calc Af Amer: >60 mL/min (ref >60)
GFR calc non Af Amer: 52 mL/min — ABNORMAL LOW (ref >60)
Glucose, Bld: 100 mg/dL — ABNORMAL HIGH (ref 65–99)
Potassium: 3.7 mmol/L (ref 3.5–5.1)
Sodium: 133 mmol/L — ABNORMAL LOW (ref 135–145)

## 2016-06-03 NOTE — Progress Notes (Signed)
PROGRESS NOTE    Nicole [name]  [id number] DOB: 08-Jun-1946 DOA: 06/02/2016 PCP: Purvis Kilts, MD  Outpatient Specialists:  Neurosurgery: Earnie Larsson, MD  Rhematology: Bo Merino, MD  Physical Medicine and Rehabilitation: Suella Broad, MD   Brief Narrative:  70 year old female presented with complaints of worsening LLQ abd pain. While being evaluated in the emergency department, she was noted to have a lactic acid of 2.5 and a WBC of 20. CT a/p revealed acute diverticulitis of the descending colon/paroxysmal sigmoid colon without evidence of abscess. She was started on IV Cipro and Flagyl and admitted for further management.    Assessment & Plan:   Active Problems:   Acute diverticulitis   Diverticulitis large intestine  1. Acute diverticulitis. WBC 19.9. Lactic Acid 2.5. CT a/p revealed acute diverticulitis of the descending colon/proximal sigmoid colon without evidence of abscess.  Pt started on Cipro and flagyl. Pain is improving, will attempt clear liquids today. Advanced diet is tolerated. Continue with IV hydration. Hold NSAIDS for now. 2. HTN. Blood pressure is stable. Hold off on oral medications until bowel issues have improved. 3. Chronic pain. Pt chronically takes nucynta. Will continue long acting pain meds. Will use IV pain meds for breakthrough pain. 4. Hypokalemia. Improved with replacement. 5. GERD. Continue home regimen of protonix. 6. Left adnexal nodule. Pelvic ultrasound stable.  DVT prophylaxis: Lovenox Code Status: Full Family Communication: Discussed with patient. No family present at bedside. Disposition Plan: Discharge home once improved.   Consultants:   none  Procedures:   none  Antimicrobials:   Cipro 8/11 >>  Flagyl 8/11 >>    Subjective: Feels improved today. Reports vomiting. Denies any bowel movement.  Objective: Vitals:   06/02/16 1302 06/02/16 1619 06/02/16 2100 06/03/16 0653  BP:  119/60 110/65 139/62  Pulse:   (!) 103 (!) 111 97  Resp:  20 20 20   Temp: 98.6 F (37 C) 99.8 F (37.7 C) 99.1 F (37.3 C) 98.9 F (37.2 C)  TempSrc: Oral Oral Oral Oral  SpO2:  96% 91% 96%  Weight:  116.5 kg (256 lb 13.4 oz)    Height:  5\' 1"  (1.549 m)     No intake or output data in the 24 hours ending 06/03/16 0750 Filed Weights   06/02/16 0916 06/02/16 1619  Weight: 117.5 kg (259 lb) 116.5 kg (256 lb 13.4 oz)    Examination: General exam: Appears calm and comfortable  Respiratory system: Clear to auscultation. Respiratory effort normal. Cardiovascular system: S1 & S2 heard, RRR. No JVD, murmurs, rubs, gallops or clicks. No pedal edema. Gastrointestinal system: Abdomen is nondistended, soft. Tender on left side, specifically LLQ. No organomegaly or masses felt. Normal bowel sounds heard. Central nervous system: Alert and oriented. No focal neurological deficits. Extremities: Symmetric 5 x 5 power. Skin: No rashes, lesions or ulcers Psychiatry: Judgement and insight appear normal. Mood & affect appropriate.    Data Reviewed: I have personally reviewed following labs and imaging studies  CBC:  Recent Labs Lab 06/02/16 0917  WBC 19.9*  HGB 13.1  HCT 39.2  MCV 87.9  PLT [id number]   Basic Metabolic Panel:  Recent Labs Lab 06/02/16 0917 06/03/16 0613  NA 136 133*  K 3.4* 3.7  CL 101 100*  CO2 27 27  GLUCOSE 104* 100*  BUN 19 16  CREATININE 1.05* 1.06*  CALCIUM 9.2 8.3*   GFR: Estimated Creatinine Clearance: 58.7 mL/min (by C-G formula based on SCr of 1.06 mg/dL). Liver Function Tests:  Recent Labs Lab 06/02/16 0917  AST 15  ALT 25  ALKPHOS 105  BILITOT 0.6  PROT 7.8  ALBUMIN 3.7    Recent Labs Lab 06/02/16 0917  LIPASE 27   Urine analysis:    Component Value Date/Time   COLORURINE YELLOW 06/02/2016 0933   APPEARANCEUR CLEAR 06/02/2016 0933   LABSPEC >1.030 (H) 06/02/2016 0933   PHURINE 5.5 06/02/2016 0933   GLUCOSEU NEGATIVE 06/02/2016 0933   HGBUR NEGATIVE 06/02/2016  0933   BILIRUBINUR NEGATIVE 06/02/2016 0933   KETONESUR NEGATIVE 06/02/2016 0933   PROTEINUR NEGATIVE 06/02/2016 0933   UROBILINOGEN 0.2 02/20/2014 1116   NITRITE NEGATIVE 06/02/2016 0933   LEUKOCYTESUR NEGATIVE 06/02/2016 0933   Radiology Studies: US Pelvis Complete  Result Date: 06/02/2016 CLINICAL DATA:  Left lower quadrant pain since 1 a.m. History of endometrial polyp removed Feb 25, 2014. History of diverticulitis. EXAM: TRANSABDOMINAL ULTRASOUND OF PELVIS TECHNIQUE: Transabdominal ultrasound examination of the pelvis was performed including evaluation of the uterus, ovaries, adnexal regions, and pelvic cul-de-sac. Patient declined the transvaginal portion the exam. COMPARISON:  06/02/2016 FINDINGS: Uterus Measurements: 10.5 x 4.7 x 6.1 cm. Limited assessment is negative. Endometrium Thickness: 2.6 mm.  Limited assessment is negative. Right ovary Measurements: The ovary is not visualized, either absent or obscured . No adnexal mass identified. Left ovary Measurements: 5.0 x 4.1 x 3.4 cm. The ovary cannot be characterized further given the limitations of transabdominal imaging and location of the ovary. Other findings:  No abnormal free fluid. IMPRESSION: 1. Left ovary estimated to be 5.0 cm but not characterized further. The CT appearance has been stable over multiple prior studies including CT 10/25/2008, and given the stability benign process is favored. 2. Nonvisualized right ovary. 3. Normal ultrasound appearance of the uterus. Electronically Signed   By: Nolon Nations M.D.   On: 06/02/2016 19:04   Ct Abdomen Pelvis W Contrast  Result Date: 06/02/2016 CLINICAL DATA:  70 year old female with a history of left lower quadrant pain EXAM: CT ABDOMEN AND PELVIS WITH CONTRAST TECHNIQUE: Multidetector CT imaging of the abdomen and pelvis was performed using the standard protocol following bolus administration of intravenous contrast. CONTRAST:  157mL ISOVUE-300 IOPAMIDOL (ISOVUE-300) INJECTION 61%  COMPARISON:  CT 08/20/2013 FINDINGS: Lower chest: Unremarkable appearance of the soft tissues of the chest wall. Heart size within normal limits.  No pericardial fluid/thickening. Calcifications in the distribution the right coronary artery and the circumflex coronary artery. No lower mediastinal adenopathy. Unremarkable appearance of the distal esophagus. Hiatal hernia No confluent airspace disease, pleural fluid, or pneumothorax within visualized lung. Abdomen/pelvis: Unremarkable appearance of liver and spleen. Unremarkable appearance of bilateral adrenal glands. No peripancreatic fluid or inflammatory changes. Cholecystectomy No intrahepatic or extrahepatic biliary ductal dilatation. No intra-peritoneal free air or significant free-fluid. Unremarkable stomach, small bowel. Inflammatory changes involving the descending colon with diverticular disease. No focal fluid collection or extraluminal gas. No evidence of obstruction. Appendix is not visualized, however, no inflammatory changes are present adjacent to the cecum to indicate an appendicitis. Right Kidney/Ureter: No hydronephrosis. No nephrolithiasis. No perinephric stranding. Unremarkable course of the right ureter. Left Kidney/Ureter: No hydronephrosis. No nephrolithiasis. No perinephric stranding. Unremarkable course of the left ureter. Unremarkable appearance of the urinary bladder. Unremarkable appearance of the uterus. Soft tissue associate with the left adnexa measures 4.0 cm. This is not significantly changed from the comparison CT. Unremarkable appearance of the right adnexa. Calcifications of the abdominal aorta. Calcifications of the bilateral iliac system. Musculoskeletal: No displaced fracture Multilevel degenerative changes of  the spine with vacuum disc phenomenon throughout the lower thoracic and lumbar levels. IMPRESSION: Acute diverticulitis of the descending colon/proximal sigmoid colon without evidence of abscess. These results were called  by telephone at the time of interpretation on 06/02/2016 at 11:46 am to Dr. Francine Graven , who verbally acknowledged these results. Aortic atherosclerosis. Nodular tissue associated with the left adnexa, measuring 4 cm. Given the patient's age, referral for GYN evaluation is warranted. Signed, Dulcy Fanny. Earleen Newport, DO [facility] Rad[facility] Electronically Signed   By: Corrie Mckusick D.O.   On: 06/02/2016 11:49    Scheduled Meds: . ciprofloxacin  400 mg Intravenous Q12H  . enoxaparin (LOVENOX) injection  60 mg Subcutaneous Q24H  . gabapentin  200 mg Oral TID  . metronidazole  500 mg Intravenous Q8H  . pantoprazole  40 mg Oral Daily  . pneumococcal 23 valent vaccine  0.5 mL Intramuscular Tomorrow-1000   Continuous Infusions: . 0.9 % NaCl with KCl 20 mEq / L 100 mL/hr at 06/03/16 0353     LOS: 1 day    Time spent: 25 minutes  Kathie Dike, MD [facility] Pager [phone]  If 7PM-7AM, please contact night-coverage [email or web address] Password [email or web address] 06/03/2016, 7:50 AM   By signing my name below, I, Delene Ruffini, attest that this documentation has been prepared under the direction and in the presence of Kathie Dike, MD. Electronically Signed: Delene Ruffini 06/03/16 12:45pm  I, Dr. Kathie Dike, personally performed the services described in this documentaiton. All medical record entries made by the scribe were at my direction and in my presence. I have reviewed the chart and agree that the record reflects my personal performance and is accurate and complete  Kathie Dike, MD, 06/03/2016 12:54 PM

## 2016-06-03 NOTE — Progress Notes (Signed)
Patient asked to bring Nucynta for pain from home for our pharmacy to supply.  Patient states that she will ask her daughter to bring the medicine.  Patient educated to notify the nurse when her daughter brings the medication so that we can take it to our pharmacy for packaging/dispensing.  Patient verbalized understanding..MD aware.

## 2016-06-04 LAB — CBC
HCT: 34.1 % — ABNORMAL LOW (ref 36.0–46.0)
Hemoglobin: 11.4 g/dL — ABNORMAL LOW (ref 12.0–15.0)
MCH: 29.6 pg (ref 26.0–34.0)
MCHC: 33.4 g/dL (ref 30.0–36.0)
MCV: 88.6 fL (ref 78.0–100.0)
Platelets: 339 10*3/uL (ref 150–400)
RBC: 3.85 MIL/uL — ABNORMAL LOW (ref 3.87–5.11)
RDW: 14.1 % (ref 11.5–15.5)
WBC: 12.5 10*3/uL — ABNORMAL HIGH (ref 4.0–10.5)

## 2016-06-04 LAB — BASIC METABOLIC PANEL
Anion gap: 7 (ref 5–15)
BUN: 12 mg/dL (ref 6–20)
CO2: 26 mmol/L (ref 22–32)
Calcium: 8.7 mg/dL — ABNORMAL LOW (ref 8.9–10.3)
Chloride: 103 mmol/L (ref 101–111)
Creatinine, Ser: 0.99 mg/dL (ref 0.44–1.00)
GFR calc Af Amer: >60 mL/min (ref >60)
GFR calc non Af Amer: 56 mL/min — ABNORMAL LOW (ref >60)
Glucose, Bld: 97 mg/dL (ref 65–99)
Potassium: 3.8 mmol/L (ref 3.5–5.1)
Sodium: 136 mmol/L (ref 135–145)

## 2016-06-04 NOTE — Progress Notes (Signed)
PROGRESS NOTE    Nicole [name]  [id number] DOB: 21-Dec-1945 DOA: 06/02/2016 PCP: Purvis Kilts, MD  Outpatient Specialists:  Neurosurgery: Earnie Larsson, MD  Rhematology: Bo Merino, MD  Physical Medicine and Rehabilitation: Suella Broad, MD   Brief Narrative:  70 year old female presented with complaints of worsening LLQ abd pain. While being evaluated in the emergency department, she was noted to have a lactic acid of 2.5 and a WBC of 20. CT a/p revealed acute diverticulitis of the descending colon/paroxysmal sigmoid colon without evidence of abscess. She was started on IV Cipro and Flagyl and admitted for further management. Her WBC has improved to 12.5 and has shown much improvement. She is overall improved and will likely be discharged in 24 hours.   Assessment & Plan:   Active Problems:   Acute diverticulitis   Diverticulitis large intestine  1. Acute diverticulitis. WBC was 20 upon admission, since has improved to 12.5 . CT a/p revealed acute diverticulitis of the descending colon/proximal sigmoid colon without evidence of abscess.  Pt started on Cipro and flagyl. Pain is improving, and she has tolerated clear liquids. Advanced diet is tolerated. Continue with IV hydration. Hold NSAIDS for now. 2. HTN. Blood pressure is stable. Hold off on oral medications until bowel issues have improved. 3. Chronic pain. Pt chronically takes nucynta. Will continue long acting pain meds. Will use IV pain meds for breakthrough pain. 4. Hypokalemia. Improved with replacement. Resolved 5. GERD. Continue home regimen of protonix. 6. Left adnexal nodule. Pelvic ultrasound stable.  DVT prophylaxis: Lovenox Code Status: Full Family Communication: no family present Disposition Plan: Discharge home in 24 hours   Consultants:   none  Procedures:   none  Antimicrobials:   Cipro 8/11 >>  Flagyl 8/11 >>     Subjective: Overall abdominal pain is better. No vomiting. Having  some diarrhea. Had 2 bowel movements today.  Objective: Vitals:   06/03/16 0653 06/03/16 1404 06/03/16 2059 06/04/16 0529  BP: 139/62 115/70 118/74 122/67  Pulse: 97 89 88 78  Resp: 20 20 20 20   Temp: 98.9 F (37.2 C) 98.7 F (37.1 C) 98.2 F (36.8 C) 98.3 F (36.8 C)  TempSrc: Oral Oral Oral Oral  SpO2: 96% 97% 98% 97%  Weight:      Height:        Intake/Output Summary (Last 24 hours) at 06/04/16 0734 Last data filed at 06/03/16 1800  Gross per 24 hour  Intake             1320 ml  Output                0 ml  Net             1320 ml   Filed Weights   06/02/16 0916 06/02/16 1619  Weight: 117.5 kg (259 lb) 116.5 kg (256 lb 13.4 oz)    Examination:  General exam: Appears calm and comfortable  Respiratory system: Clear to auscultation. Respiratory effort normal. Cardiovascular system: S1 & S2 heard, RRR. No JVD, murmurs, rubs, gallops or clicks. No pedal edema. Gastrointestinal system: Abdomen is nondistended, soft and tender in LLQ. No organomegaly or masses felt. Normal bowel sounds heard. Central nervous system: Alert and oriented. No focal neurological deficits. Extremities: Symmetric 5 x 5 power. Skin: No rashes, lesions or ulcers Psychiatry: Judgement and insight appear normal. Mood & affect appropriate.    Data Reviewed: I have personally reviewed following labs and imaging studies  CBC:  Recent Labs Lab  06/02/16 0917 06/04/16 0621  WBC 19.9* 12.5*  HGB 13.1 11.4*  HCT 39.2 34.1*  MCV 87.9 88.6  PLT 350 [id number]   Basic Metabolic Panel:  Recent Labs Lab 06/02/16 0917 06/03/16 0613  NA 136 133*  K 3.4* 3.7  CL 101 100*  CO2 27 27  GLUCOSE 104* 100*  BUN 19 16  CREATININE 1.05* 1.06*  CALCIUM 9.2 8.3*   GFR: Estimated Creatinine Clearance: 58.7 mL/min (by C-G formula based on SCr of 1.06 mg/dL). Liver Function Tests:  Recent Labs Lab 06/02/16 0917  AST 15  ALT 25  ALKPHOS 105  BILITOT 0.6  PROT 7.8  ALBUMIN 3.7    Recent Labs Lab  06/02/16 0917  LIPASE 27   Urine analysis:    Component Value Date/Time   COLORURINE YELLOW 06/02/2016 0933   APPEARANCEUR CLEAR 06/02/2016 0933   LABSPEC >1.030 (H) 06/02/2016 0933   PHURINE 5.5 06/02/2016 0933   GLUCOSEU NEGATIVE 06/02/2016 0933   HGBUR NEGATIVE 06/02/2016 0933   BILIRUBINUR NEGATIVE 06/02/2016 0933   KETONESUR NEGATIVE 06/02/2016 0933   PROTEINUR NEGATIVE 06/02/2016 0933   UROBILINOGEN 0.2 02/20/2014 1116   NITRITE NEGATIVE 06/02/2016 0933   LEUKOCYTESUR NEGATIVE 06/02/2016 0933   Radiology Studies: US Pelvis Complete  Result Date: 06/02/2016 CLINICAL DATA:  Left lower quadrant pain since 1 a.m. History of endometrial polyp removed Feb 25, 2014. History of diverticulitis. EXAM: TRANSABDOMINAL ULTRASOUND OF PELVIS TECHNIQUE: Transabdominal ultrasound examination of the pelvis was performed including evaluation of the uterus, ovaries, adnexal regions, and pelvic cul-de-sac. Patient declined the transvaginal portion the exam. COMPARISON:  06/02/2016 FINDINGS: Uterus Measurements: 10.5 x 4.7 x 6.1 cm. Limited assessment is negative. Endometrium Thickness: 2.6 mm.  Limited assessment is negative. Right ovary Measurements: The ovary is not visualized, either absent or obscured . No adnexal mass identified. Left ovary Measurements: 5.0 x 4.1 x 3.4 cm. The ovary cannot be characterized further given the limitations of transabdominal imaging and location of the ovary. Other findings:  No abnormal free fluid. IMPRESSION: 1. Left ovary estimated to be 5.0 cm but not characterized further. The CT appearance has been stable over multiple prior studies including CT 10/25/2008, and given the stability benign process is favored. 2. Nonvisualized right ovary. 3. Normal ultrasound appearance of the uterus. Electronically Signed   By: Nolon Nations M.D.   On: 06/02/2016 19:04   Ct Abdomen Pelvis W Contrast  Result Date: 06/02/2016 CLINICAL DATA:  70 year old female with a history of  left lower quadrant pain EXAM: CT ABDOMEN AND PELVIS WITH CONTRAST TECHNIQUE: Multidetector CT imaging of the abdomen and pelvis was performed using the standard protocol following bolus administration of intravenous contrast. CONTRAST:  174mL ISOVUE-300 IOPAMIDOL (ISOVUE-300) INJECTION 61% COMPARISON:  CT 08/20/2013 FINDINGS: Lower chest: Unremarkable appearance of the soft tissues of the chest wall. Heart size within normal limits.  No pericardial fluid/thickening. Calcifications in the distribution the right coronary artery and the circumflex coronary artery. No lower mediastinal adenopathy. Unremarkable appearance of the distal esophagus. Hiatal hernia No confluent airspace disease, pleural fluid, or pneumothorax within visualized lung. Abdomen/pelvis: Unremarkable appearance of liver and spleen. Unremarkable appearance of bilateral adrenal glands. No peripancreatic fluid or inflammatory changes. Cholecystectomy No intrahepatic or extrahepatic biliary ductal dilatation. No intra-peritoneal free air or significant free-fluid. Unremarkable stomach, small bowel. Inflammatory changes involving the descending colon with diverticular disease. No focal fluid collection or extraluminal gas. No evidence of obstruction. Appendix is not visualized, however, no inflammatory changes are present adjacent to the  cecum to indicate an appendicitis. Right Kidney/Ureter: No hydronephrosis. No nephrolithiasis. No perinephric stranding. Unremarkable course of the right ureter. Left Kidney/Ureter: No hydronephrosis. No nephrolithiasis. No perinephric stranding. Unremarkable course of the left ureter. Unremarkable appearance of the urinary bladder. Unremarkable appearance of the uterus. Soft tissue associate with the left adnexa measures 4.0 cm. This is not significantly changed from the comparison CT. Unremarkable appearance of the right adnexa. Calcifications of the abdominal aorta. Calcifications of the bilateral iliac system.  Musculoskeletal: No displaced fracture Multilevel degenerative changes of the spine with vacuum disc phenomenon throughout the lower thoracic and lumbar levels. IMPRESSION: Acute diverticulitis of the descending colon/proximal sigmoid colon without evidence of abscess. These results were called by telephone at the time of interpretation on 06/02/2016 at 11:46 am to Dr. Francine Graven , who verbally acknowledged these results. Aortic atherosclerosis. Nodular tissue associated with the left adnexa, measuring 4 cm. Given the patient's age, referral for GYN evaluation is warranted. Signed, Dulcy Fanny. Earleen Newport, DO [facility] Electronically Signed   By: Corrie Mckusick D.O.   On: 06/02/2016 11:49    Scheduled Meds: . ciprofloxacin  400 mg Intravenous Q12H  . enoxaparin (LOVENOX) injection  60 mg Subcutaneous Q24H  . gabapentin  200 mg Oral TID  . metronidazole  500 mg Intravenous Q8H  . pantoprazole  40 mg Oral Daily  . pneumococcal 23 valent vaccine  0.5 mL Intramuscular Tomorrow-1000   Continuous Infusions: . 0.9 % NaCl with KCl 20 mEq / L 100 mL/hr at 06/03/16 1736     LOS: 2 days    Time spent: 25 minutes  Kathie Dike, MD [facility] Pager [phone]  If 7PM-7AM, please contact night-coverage [email or web address] Password [facility] 06/04/2016, 7:34 AM

## 2016-06-04 NOTE — Progress Notes (Signed)
Pt. Refuses to ambulate. States "My legs keep giving out on me."

## 2016-06-05 DIAGNOSIS — G894 Chronic pain syndrome: Secondary | ICD-10-CM | POA: Diagnosis present

## 2016-06-05 DIAGNOSIS — I10 Essential (primary) hypertension: Secondary | ICD-10-CM | POA: Diagnosis present

## 2016-06-05 LAB — BASIC METABOLIC PANEL
Anion gap: 8 (ref 5–15)
BUN: 11 mg/dL (ref 6–20)
CO2: 25 mmol/L (ref 22–32)
Calcium: 8.6 mg/dL — ABNORMAL LOW (ref 8.9–10.3)
Chloride: 104 mmol/L (ref 101–111)
Creatinine, Ser: 0.93 mg/dL (ref 0.44–1.00)
GFR calc Af Amer: >60 mL/min (ref >60)
GFR calc non Af Amer: >60 mL/min (ref >60)
Glucose, Bld: 97 mg/dL (ref 65–99)
Potassium: 3.9 mmol/L (ref 3.5–5.1)
Sodium: 137 mmol/L (ref 135–145)

## 2016-06-05 LAB — CBC
HCT: 32.3 % — ABNORMAL LOW (ref 36.0–46.0)
Hemoglobin: 10.6 g/dL — ABNORMAL LOW (ref 12.0–15.0)
MCH: 29.4 pg (ref 26.0–34.0)
MCHC: 32.8 g/dL (ref 30.0–36.0)
MCV: 89.5 fL (ref 78.0–100.0)
Platelets: 309 10*3/uL (ref 150–400)
RBC: 3.61 MIL/uL — ABNORMAL LOW (ref 3.87–5.11)
RDW: 14.1 % (ref 11.5–15.5)
WBC: 9.5 10*3/uL (ref 4.0–10.5)

## 2016-06-05 MED ORDER — PREDNISONE 5 MG PO TABS: ORAL_TABLET | ORAL | Status: DC

## 2016-06-05 MED ORDER — HYDROCODONE-ACETAMINOPHEN 5-325 MG PO TABS: 1.0000 | ORAL_TABLET | Freq: Four times a day (QID) | ORAL | 0 refills | Status: DC | PRN

## 2016-06-05 MED ORDER — CIPROFLOXACIN HCL 500 MG PO TABS: 500.0000 mg | ORAL_TABLET | Freq: Two times a day (BID) | ORAL | 0 refills | Status: DC

## 2016-06-05 MED ORDER — METRONIDAZOLE 500 MG PO TABS: 500.0000 mg | ORAL_TABLET | Freq: Three times a day (TID) | ORAL | 0 refills | Status: DC

## 2016-06-05 NOTE — Evaluation (Signed)
Physical Therapy Evaluation Patient Details Name: Nicole [name] MRN: [id number] DOB: [birth year] Today's Date: 06/05/2016   History of Present Illness  70 yo F admitted 06/02/2016 with abdominal pain.  She had an episode of vomiting in the ER. She has not had any diarrhea or fever. She has significant SOB.  CT a/p revealed Acute diverticulitis of the descending colon/proximal sigmoid colon without evidence of abscess. She was admitted for acute diverticulitis.  PMH: L AC joint bone spurs, anemia, arthritis, carpal tunnel, HTN, lumbar stenosis, polyarthralgia, polymyalgia, shingles, spinal stenosis, cholecystectomy, resection of R distal clavicle.    Clinical Impression  Pt received in bed, and was agreeable to PT evaluation.  Pt expressed that she lives alone, but brother lives next door, and she has family that stops in to see her daily - they assist her with cooking/cleaning.  Pt ambulated 50ft with RW and Min guard today, which is likely close to her baseline.  She states that she mostly ambulates from room to room.  Pt is recommend to d/c home with HHPT at this time, and she would benefit from a light weight rollator walker.     Follow Up Recommendations Home health PT    Equipment Recommendations   (lightweight rollator - had received original prescription from Dr. Dossie Der for this. )    Recommendations for Other Services       Precautions / Restrictions        Mobility  Bed Mobility Overal bed mobility: Needs Assistance Bed Mobility: Supine to Sit     Supine to sit: HOB elevated;Min guard     General bed mobility comments: increased time.   Transfers Overall transfer level: Needs assistance Equipment used: Rolling walker (2 wheeled) Transfers: Sit to/from Stand Sit to Stand: Min guard            Ambulation/Gait Ambulation/Gait assistance: Min guard Ambulation Distance (Feet): 30 Feet Assistive device: Rolling walker (2 wheeled) Gait Pattern/deviations: Step-to  pattern   Gait velocity interpretation: <1.8 ft/sec, indicative of risk for recurrent falls General Gait Details: multiple standing rest breaks to rest UE's.    Stairs            Wheelchair Mobility    Modified Rankin (Stroke Patients Only)       Balance Overall balance assessment: Needs assistance Sitting-balance support: Bilateral upper extremity supported Sitting balance-Leahy Scale: Good     Standing balance support: Bilateral upper extremity supported Standing balance-Leahy Scale: Fair                               Pertinent Vitals/Pain Pain Assessment: 0-10 Pain Score: 5  Pain Location: down in legs and back Pain Descriptors / Indicators: Cramping Pain Intervention(s): Limited activity within patient's tolerance    Home Living   Living Arrangements: Alone Available Help at Discharge: Family (family stops in every day. ) Type of Home: House Home Access: Stairs to enter   [employer] of Steps: 1 step up onto the porch, then a threshold into the house.  Home Layout: One level Home Equipment: Walker - 2 wheels;Cane - single point;Other (comment) (Dr. Dossie Der has written a script for a rollator, but it was too heavy for her to lift into her car, so they ordered a light weight rollator, and she has yet to receive it.  )      Prior Function Level of Independence: Needs assistance   Gait / Transfers Assistance Needed: Pt uses  a RW in the house  ADL's / Homemaking Assistance Needed: still driving, and assistance with bathing, as independent with dressing.    Comments: had HHPT which ended ~16month ago and scheduled to start OPPT in October.      Hand Dominance   Dominant Hand: Right    Extremity/Trunk Assessment   Upper Extremity Assessment: Generalized weakness           Lower Extremity Assessment: Generalized weakness         Communication   Communication: No difficulties  Cognition Arousal/Alertness:  Awake/alert Behavior During Therapy: WFL for tasks assessed/performed Overall Cognitive Status: Within Functional Limits for tasks assessed                      General Comments      Exercises        Assessment/Plan    PT Assessment Patient needs continued PT services  PT Diagnosis Difficulty walking;Abnormality of gait;Generalized weakness   PT Problem List Decreased strength;Decreased activity tolerance;Decreased range of motion;Decreased balance;Decreased mobility;Obesity  PT Treatment Interventions DME instruction;Gait training;Functional mobility training;Therapeutic activities;Stair training;Therapeutic exercise;Balance training;Patient/family education   PT Goals (Current goals can be found in the Care Plan section) Acute Rehab PT Goals Patient Stated Goal: Pt wants to get stronger PT Goal Formulation: With patient Time For Goal Achievement: 06/12/16 Potential to Achieve Goals: Good    Frequency Min 3X/week   Barriers to discharge Decreased caregiver support      Co-evaluation               End of Session Equipment Utilized During Treatment: Gait belt Activity Tolerance: Patient tolerated treatment well Patient left: in chair Nurse Communication: Mobility status    Functional Assessment Tool Used: [employer] "6-clicks"  Functional Limitation: Mobility: Walking and moving around Mobility: Walking and Moving Around Current Status [phone]): At least 20 percent but less than 40 percent impaired, limited or restricted Mobility: Walking and Moving Around Goal Status [phone]): At least 1 percent but less than 20 percent impaired, limited or restricted    Time: [phone] PT Time Calculation (min) (ACUTE ONLY): 35 min   Charges:   PT Evaluation $PT Eval Low Complexity: 1 Procedure PT Treatments $Gait Training: 8-22 mins   PT G Codes:   PT G-Codes **NOT FOR INPATIENT CLASS** Functional Assessment Tool Used: [employer]  "6-clicks"  Functional Limitation: Mobility: Walking and moving around Mobility: Walking and Moving Around Current Status [phone]): At least 20 percent but less than 40 percent impaired, limited or restricted Mobility: Walking and Moving Around Goal Status [phone]): At least 1 percent but less than 20 percent impaired, limited or restricted    Beth Ezzie Senat, PT, DPT X: [phone]

## 2016-06-05 NOTE — Care Management Note (Signed)
Case Management Note  Patient Details  Name: Nicole [name] MRN: [id number] Date of Birth: 29-Nov-1945  Subjective/Objective:                  Pt is from home, lives alone and has family who check in on her often. Pt recently DC'd from [facility] services through [facility]. Dian Situ, of [facility] called and given referral, he will obtain pt info from chart. Pt enjoyed their services and would like to use them again. Pt needs rollator and would like it from [facility]. Blake Divine of [facility], made aware of referral, will obtain pt info from chart and deliver to pt room prior to DC. Pt aware that [facility] has 48 hrs to initiate services.   Action/Plan: Anticipate DC today with HH services and rollator.   Expected Discharge Date:      06/05/2016            Expected Discharge Plan:  [place]  In-House Referral:  NA  Discharge planning Services  CM Consult  Post Acute Care Choice:  Durable Medical Equipment, Home Health Choice offered to:  Patient  DME Arranged:  Walker rolling with seat DME Agency:  [place] Arranged:  PT, RN [facility] Agency:  Other - See comment ([place])  Status of Service:  Completed, signed off  If discussed at [place] of Stay Meetings, dates discussed:    Additional Comments:  Sherald Barge, RN 06/05/2016, 12:55 PM

## 2016-06-05 NOTE — Care Management Important Message (Signed)
Important Message  Patient Details  Name: Nicole [name] MRN: [id number] Date of Birth: Sep 07, 1946   Medicare Important Message Given:  Yes    Sherald Barge, RN 06/05/2016, 12:50 PM

## 2016-06-05 NOTE — Discharge Summary (Addendum)
Physician Discharge Summary  Nicole [name] [id number] DOB: 1946/05/31 DOA: 06/02/2016  PCP: Purvis Kilts, MD  Admit date: 06/02/2016 Discharge date: 06/05/2016  Admitted From: home Disposition:  home  Recommendations for Outpatient Follow-up:  1. Follow up with PCP in 1-2 weeks 2. Please obtain BMP/CBC in one week   Home Health: home health PT Equipment/Devices: rolling walker  Discharge Condition: stable CODE STATUS: full Diet recommendation: heart healthy  Brief/Interim Summary: This is a 70 y/o female with multiple medical problems who presents to the hospital with complaints of abdominal pain. CT imaging showed acute diverticulitis. She was started on intravenous ciprofloxacin and flagyl. She was treated with bowel rest and diet was slowly advanced. Her overall abdominal pain is better. She is tolerating a solid diet without vomiting. She has been transitioned to oral abx to complete her course.  The remainder of her medical issues remained stable.  Discharge Diagnoses:  Active Problems:   GERD (gastroesophageal reflux disease)   Acute diverticulitis   Diverticulitis large intestine   HTN (hypertension)   Chronic pain syndrome   Morbid Obesity   Discharge Instructions  Discharge Instructions    Diet - low sodium heart healthy    Complete by:  As directed   Increase activity slowly    Complete by:  As directed       Medication List    STOP taking these medications   diclofenac 75 MG EC tablet Commonly known as:  VOLTAREN     TAKE these medications   ciprofloxacin 500 MG tablet Commonly known as:  CIPRO Take 1 tablet (500 mg total) by mouth 2 (two) times daily.   gabapentin 100 MG capsule Commonly known as:  NEURONTIN Take 2 capsules (200 mg total) by mouth 3 (three) times daily.   HYDROcodone-acetaminophen 5-325 MG tablet Commonly known as:  NORCO Take 1-2 tablets by mouth every 6 (six) hours as needed for moderate pain.   metroNIDAZOLE 500 MG  tablet Commonly known as:  FLAGYL Take 1 tablet (500 mg total) by mouth 3 (three) times daily.   NONFORMULARY OR COMPOUNDED ITEM Apply 1-2 application topically every 8 (eight) hours as needed (Diclofenac 3%, Lidocaine 5%, Menthol 1%     (Cream 100 Gm)).   NUCYNTA 50 MG tablet Generic drug:  tapentadol Take 50 mg by mouth 3 (three) times daily as needed for moderate pain.   pantoprazole 40 MG tablet Commonly known as:  PROTONIX Take 40 mg by mouth daily.   predniSONE 5 MG tablet Commonly known as:  DELTASONE Two tablets daily for one week and one tablet daily for one month. Restart once antibiotics complete What changed:  additional instructions   valsartan-hydrochlorothiazide 160-25 MG tablet Commonly known as:  DIOVAN-HCT Take 1 tablet by mouth daily.   Vitamin D (Ergocalciferol) 50000 units Caps capsule Commonly known as:  DRISDOL Take 50,000 Units by mouth every 7 (seven) days. Takes on Wednesday       Allergies  Allergen Reactions  . Oxycontin [Oxycodone Hcl] Swelling  . Sulfa Antibiotics Other (See Comments)    Sores  . Tramadol     "Makes me feel like I am falling"  . Penicillins Rash    Consultations:     Procedures/Studies: Mr Cervical Spine Wo Contrast  Result Date: 05/31/2016 [facility] NEUROIMAGING REPORT STUDY DATE: 05/30/16 PATIENT NAME: Nicole [name] DOB: 09-12-1946 MRN: [id number] ORDERING CLINICIAN: Marcial Pacas, MD PhD CLINICAL HISTORY: 70 year old female with neck pain, hand numbness and gait difficulty. EXAM:  MRI cervical spine (without) TECHNIQUE: MRI of the cervical spine was obtained utilizing 3 mm sagittal slices from the posterior fossa down to the T3-4 level with T1, T2 and inversion recovery views. In addition 4 mm axial slices from [id number] down to T1-2 level were included with T2 and gradient echo views. CONTRAST: no IMAGING SITE: [facility] [address] (1.5 Tesla MRI)  FINDINGS: On sagittal views the vertebral  bodies have normal height and alignment. Degenerative spondylosis and disc bulging from C3-4 to C6-7; mild disc bulging at T1-2 and T2-3. The spinal cord is normal in size and appearance. The posterior fossa, pituitary gland and paraspinal soft tissues are notable for partially empty sella.  On axial views: C2-3: facet hypertrophy with no spinal stenosis or foraminal narrowing C3-4: disc bulging and uncovertebral joint hypertrophy and facet hypertrophy with severe biforaminal stenosis; ventral surface of spinal cord slightly deformed without spinal stenosis C4-5: disc bulging and uncovertebral joint hypertrophy and facet hypertrophy with severe biforaminal stenosis C5-6: disc bulging and uncovertebral joint hypertrophy and facet hypertrophy with severe biforaminal stenosis C6-7: disc bulging and uncovertebral joint hypertrophy and facet hypertrophy with moderate-severe biforaminal stenosis C7-T1: no spinal stenosis or foraminal narrowing T1-2: no spinal stenosis or foraminal narrowing Limited views of the soft tissues of the head and neck are unremarkable.   Abnormal MRI cervical spine (without) demonstrating: 1. At C3-4, C4-5, C5-6: disc bulging and uncovertebral joint hypertrophy and facet hypertrophy with severe biforaminal stenosis 2. At C6-7: disc bulging and uncovertebral joint hypertrophy and facet hypertrophy with moderate-severe biforaminal stenosis 3. No intrinsic or compressive spinal cord lesions. INTERPRETING PHYSICIAN: Nicole Bombard, MD Certified in Neurology, Neurophysiology and Neuroimaging [facility] [address] [place], [place] [address] [phone]   US Pelvis Complete  Result Date: 06/02/2016 CLINICAL DATA:  Left lower quadrant pain since 1 a.m. History of endometrial polyp removed Feb 25, 2014. History of diverticulitis. EXAM: TRANSABDOMINAL ULTRASOUND OF PELVIS TECHNIQUE: Transabdominal ultrasound examination of the pelvis was performed including  evaluation of the uterus, ovaries, adnexal regions, and pelvic cul-de-sac. Patient declined the transvaginal portion the exam. COMPARISON:  06/02/2016 FINDINGS: Uterus Measurements: 10.5 x 4.7 x 6.1 cm. Limited assessment is negative. Endometrium Thickness: 2.6 mm.  Limited assessment is negative. Right ovary Measurements: The ovary is not visualized, either absent or obscured . No adnexal mass identified. Left ovary Measurements: 5.0 x 4.1 x 3.4 cm. The ovary cannot be characterized further given the limitations of transabdominal imaging and location of the ovary. Other findings:  No abnormal free fluid. IMPRESSION: 1. Left ovary estimated to be 5.0 cm but not characterized further. The CT appearance has been stable over multiple prior studies including CT 10/25/2008, and given the stability benign process is favored. 2. Nonvisualized right ovary. 3. Normal ultrasound appearance of the uterus. Electronically Signed   By: Nolon Nations M.D.   On: 06/02/2016 19:04   Ct Abdomen Pelvis W Contrast  Result Date: 06/02/2016 CLINICAL DATA:  70 year old female with a history of left lower quadrant pain EXAM: CT ABDOMEN AND PELVIS WITH CONTRAST TECHNIQUE: Multidetector CT imaging of the abdomen and pelvis was performed using the standard protocol following bolus administration of intravenous contrast. CONTRAST:  113mL ISOVUE-300 IOPAMIDOL (ISOVUE-300) INJECTION 61% COMPARISON:  CT 08/20/2013 FINDINGS: Lower chest: Unremarkable appearance of the soft tissues of the chest wall. Heart size within normal limits.  No pericardial fluid/thickening. Calcifications in the distribution the right coronary artery and the circumflex coronary artery. No lower  mediastinal adenopathy. Unremarkable appearance of the distal esophagus. Hiatal hernia No confluent airspace disease, pleural fluid, or pneumothorax within visualized lung. Abdomen/pelvis: Unremarkable appearance of liver and spleen. Unremarkable appearance of bilateral  adrenal glands. No peripancreatic fluid or inflammatory changes. Cholecystectomy No intrahepatic or extrahepatic biliary ductal dilatation. No intra-peritoneal free air or significant free-fluid. Unremarkable stomach, small bowel. Inflammatory changes involving the descending colon with diverticular disease. No focal fluid collection or extraluminal gas. No evidence of obstruction. Appendix is not visualized, however, no inflammatory changes are present adjacent to the cecum to indicate an appendicitis. Right Kidney/Ureter: No hydronephrosis. No nephrolithiasis. No perinephric stranding. Unremarkable course of the right ureter. Left Kidney/Ureter: No hydronephrosis. No nephrolithiasis. No perinephric stranding. Unremarkable course of the left ureter. Unremarkable appearance of the urinary bladder. Unremarkable appearance of the uterus. Soft tissue associate with the left adnexa measures 4.0 cm. This is not significantly changed from the comparison CT. Unremarkable appearance of the right adnexa. Calcifications of the abdominal aorta. Calcifications of the bilateral iliac system. Musculoskeletal: No displaced fracture Multilevel degenerative changes of the spine with vacuum disc phenomenon throughout the lower thoracic and lumbar levels. IMPRESSION: Acute diverticulitis of the descending colon/proximal sigmoid colon without evidence of abscess. These results were called by telephone at the time of interpretation on 06/02/2016 at 11:46 am to Dr. Francine Graven , who verbally acknowledged these results. Aortic atherosclerosis. Nodular tissue associated with the left adnexa, measuring 4 cm. Given the patient's age, referral for GYN evaluation is warranted. Signed, Dulcy Fanny. Earleen Newport, DO [facility] Electronically Signed   By: Corrie Mckusick D.O.   On: 06/02/2016 11:49       Subjective: Abdominal pain improving  Discharge Exam: Vitals:   06/04/16 2133  06/04/16 2248  BP:  (!) 116/59  Pulse: 89 87  Resp: 20 20  Temp:  97.8 F (36.6 C)   Vitals:   06/04/16 0529 06/04/16 1304 06/04/16 2133 06/04/16 2248  BP: 122/67 (!) 107/50  (!) 116/59  Pulse: 78 (!) 103 89 87  Resp: 20 20 20 20   Temp: 98.3 F (36.8 C) 98.7 F (37.1 C)  97.8 F (36.6 C)  TempSrc: Oral Oral  Oral  SpO2: 97% 96% 94% 95%  Weight:      Height:        General: Pt is alert, awake, not in acute distress Cardiovascular: RRR, S1/S2 +, no rubs, no gallops Respiratory: CTA bilaterally, no wheezing, no rhonchi Abdominal: Soft, tender in LLQ, ND, bowel sounds + Extremities: no edema, no cyanosis    The results of significant diagnostics from this hospitalization (including imaging, microbiology, ancillary and laboratory) are listed below for reference.     Microbiology: No results found for this or any previous visit (from the past 240 hour(s)).   Labs: BNP (last 3 results) No results for input(s): BNP in the last 8760 hours. Basic Metabolic Panel:  Recent Labs Lab 06/02/16 0917 06/03/16 0613 06/04/16 0621 06/05/16 0529  NA 136 133* 136 137  K 3.4* 3.7 3.8 3.9  CL 101 100* 103 104  CO2 27 27 26 25   GLUCOSE 104* 100* 97 97  BUN 19 16 12 11   CREATININE 1.05* 1.06* 0.99 0.93  CALCIUM 9.2 8.3* 8.7* 8.6*   Liver Function Tests:  Recent Labs Lab 06/02/16 0917  AST 15  ALT 25  ALKPHOS 105  BILITOT 0.6  PROT 7.8  ALBUMIN 3.7    Recent Labs Lab 06/02/16 0917  LIPASE 27  No results for input(s): AMMONIA in the last 168 hours. CBC:  Recent Labs Lab 06/02/16 0917 06/04/16 0621 06/05/16 0529  WBC 19.9* 12.5* 9.5  HGB 13.1 11.4* 10.6*  HCT 39.2 34.1* 32.3*  MCV 87.9 88.6 89.5  PLT 350 339 309   Cardiac Enzymes: No results for input(s): CKTOTAL, CKMB, CKMBINDEX, TROPONINI in the last 168 hours. BNP: Invalid input(s): POCBNP CBG: No results for input(s): GLUCAP in the last 168 hours. D-Dimer No results for input(s): DDIMER in the  last 72 hours. Hgb A1c No results for input(s): HGBA1C in the last 72 hours. Lipid Profile No results for input(s): CHOL, HDL, LDLCALC, TRIG, CHOLHDL, LDLDIRECT in the last 72 hours. Thyroid function studies No results for input(s): TSH, T4TOTAL, T3FREE, THYROIDAB in the last 72 hours.  Invalid input(s): FREET3 Anemia work up No results for input(s): VITAMINB12, FOLATE, FERRITIN, TIBC, IRON, RETICCTPCT in the last 72 hours. Urinalysis    Component Value Date/Time   COLORURINE YELLOW 06/02/2016 0933   APPEARANCEUR CLEAR 06/02/2016 0933   LABSPEC >1.030 (H) 06/02/2016 0933   PHURINE 5.5 06/02/2016 0933   GLUCOSEU NEGATIVE 06/02/2016 0933   HGBUR NEGATIVE 06/02/2016 0933   BILIRUBINUR NEGATIVE 06/02/2016 0933   KETONESUR NEGATIVE 06/02/2016 0933   PROTEINUR NEGATIVE 06/02/2016 0933   UROBILINOGEN 0.2 02/20/2014 1116   NITRITE NEGATIVE 06/02/2016 0933   LEUKOCYTESUR NEGATIVE 06/02/2016 0933   Sepsis Labs Invalid input(s): PROCALCITONIN,  WBC,  LACTICIDVEN Microbiology No results found for this or any previous visit (from the past 240 hour(s)).   Time coordinating discharge: Over 30 minutes  SIGNED:   Kathie Dike, MD  [facility] 06/05/2016, 12:27 PM Pager   If 7PM-7AM, please contact night-coverage [email or web address] Password [email or web address]

## 2016-06-06 ENCOUNTER — Telehealth: Payer: Self-pay | Admitting: Neurology

## 2016-06-06 NOTE — Telephone Encounter (Signed)
Patient aware of results and will keep pending appt.

## 2016-06-06 NOTE — Telephone Encounter (Signed)
Nicole [name], please call patient MRI of cervical spine showed evidence of multilevel cervical degenerative disc disease, cause variable degree of bilateral foraminal stenosis, but there was no evidence of cervical cord compression  I will reveal findings at her next follow-up visit  Abnormal MRI cervical spine (without) demonstrating: 1. At C3-4, C4-5, C5-6: disc bulging and uncovertebral joint hypertrophy and facet hypertrophy with severe biforaminal stenosis  2. At C6-7: disc bulging and uncovertebral joint hypertrophy and facet hypertrophy with moderate-severe biforaminal stenosis  3. No intrinsic or compressive spinal cord lesions.

## 2016-06-06 NOTE — Progress Notes (Signed)
Discharge instructions and prescriptions given, verbalized understanding, out in stable condition via w/c with staff. 

## 2016-06-07 DIAGNOSIS — G894 Chronic pain syndrome: Secondary | ICD-10-CM | POA: Diagnosis not present

## 2016-06-07 DIAGNOSIS — R260 Ataxic gait: Secondary | ICD-10-CM | POA: Diagnosis not present

## 2016-06-07 DIAGNOSIS — I10 Essential (primary) hypertension: Secondary | ICD-10-CM | POA: Diagnosis not present

## 2016-06-07 DIAGNOSIS — M6281 Muscle weakness (generalized): Secondary | ICD-10-CM | POA: Diagnosis not present

## 2016-06-07 DIAGNOSIS — K5792 Diverticulitis of intestine, part unspecified, without perforation or abscess without bleeding: Secondary | ICD-10-CM | POA: Diagnosis not present

## 2016-06-07 DIAGNOSIS — M150 Primary generalized (osteo)arthritis: Secondary | ICD-10-CM | POA: Diagnosis not present

## 2016-06-07 DIAGNOSIS — Z792 Long term (current) use of antibiotics: Secondary | ICD-10-CM | POA: Diagnosis not present

## 2016-06-07 DIAGNOSIS — M4806 Spinal stenosis, lumbar region: Secondary | ICD-10-CM | POA: Diagnosis not present

## 2016-06-07 DIAGNOSIS — M353 Polymyalgia rheumatica: Secondary | ICD-10-CM | POA: Diagnosis not present

## 2016-06-07 DIAGNOSIS — M21372 Foot drop, left foot: Secondary | ICD-10-CM | POA: Diagnosis not present

## 2016-06-07 DIAGNOSIS — M545 Low back pain: Secondary | ICD-10-CM | POA: Diagnosis not present

## 2016-06-07 DIAGNOSIS — K219 Gastro-esophageal reflux disease without esophagitis: Secondary | ICD-10-CM | POA: Diagnosis not present

## 2016-06-08 DIAGNOSIS — R260 Ataxic gait: Secondary | ICD-10-CM | POA: Diagnosis not present

## 2016-06-08 DIAGNOSIS — M4806 Spinal stenosis, lumbar region: Secondary | ICD-10-CM | POA: Diagnosis not present

## 2016-06-08 DIAGNOSIS — K5732 Diverticulitis of large intestine without perforation or abscess without bleeding: Secondary | ICD-10-CM | POA: Diagnosis not present

## 2016-06-08 DIAGNOSIS — G894 Chronic pain syndrome: Secondary | ICD-10-CM | POA: Diagnosis not present

## 2016-06-08 DIAGNOSIS — M6281 Muscle weakness (generalized): Secondary | ICD-10-CM | POA: Diagnosis not present

## 2016-06-08 DIAGNOSIS — Z6841 Body Mass Index (BMI) 40.0 and over, adult: Secondary | ICD-10-CM | POA: Diagnosis not present

## 2016-06-08 DIAGNOSIS — Z23 Encounter for immunization: Secondary | ICD-10-CM | POA: Diagnosis not present

## 2016-06-08 DIAGNOSIS — K219 Gastro-esophageal reflux disease without esophagitis: Secondary | ICD-10-CM | POA: Diagnosis not present

## 2016-06-08 DIAGNOSIS — K5792 Diverticulitis of intestine, part unspecified, without perforation or abscess without bleeding: Secondary | ICD-10-CM | POA: Diagnosis not present

## 2016-06-08 DIAGNOSIS — M150 Primary generalized (osteo)arthritis: Secondary | ICD-10-CM | POA: Diagnosis not present

## 2016-06-09 DIAGNOSIS — M6281 Muscle weakness (generalized): Secondary | ICD-10-CM | POA: Diagnosis not present

## 2016-06-09 DIAGNOSIS — G894 Chronic pain syndrome: Secondary | ICD-10-CM | POA: Diagnosis not present

## 2016-06-09 DIAGNOSIS — M150 Primary generalized (osteo)arthritis: Secondary | ICD-10-CM | POA: Diagnosis not present

## 2016-06-09 DIAGNOSIS — K5792 Diverticulitis of intestine, part unspecified, without perforation or abscess without bleeding: Secondary | ICD-10-CM | POA: Diagnosis not present

## 2016-06-09 DIAGNOSIS — R260 Ataxic gait: Secondary | ICD-10-CM | POA: Diagnosis not present

## 2016-06-09 DIAGNOSIS — M4806 Spinal stenosis, lumbar region: Secondary | ICD-10-CM | POA: Diagnosis not present

## 2016-06-12 DIAGNOSIS — M150 Primary generalized (osteo)arthritis: Secondary | ICD-10-CM | POA: Diagnosis not present

## 2016-06-12 DIAGNOSIS — M4806 Spinal stenosis, lumbar region: Secondary | ICD-10-CM | POA: Diagnosis not present

## 2016-06-12 DIAGNOSIS — R260 Ataxic gait: Secondary | ICD-10-CM | POA: Diagnosis not present

## 2016-06-12 DIAGNOSIS — M6281 Muscle weakness (generalized): Secondary | ICD-10-CM | POA: Diagnosis not present

## 2016-06-12 DIAGNOSIS — K5792 Diverticulitis of intestine, part unspecified, without perforation or abscess without bleeding: Secondary | ICD-10-CM | POA: Diagnosis not present

## 2016-06-12 DIAGNOSIS — G894 Chronic pain syndrome: Secondary | ICD-10-CM | POA: Diagnosis not present

## 2016-06-14 DIAGNOSIS — M150 Primary generalized (osteo)arthritis: Secondary | ICD-10-CM | POA: Diagnosis not present

## 2016-06-14 DIAGNOSIS — M4806 Spinal stenosis, lumbar region: Secondary | ICD-10-CM | POA: Diagnosis not present

## 2016-06-14 DIAGNOSIS — R260 Ataxic gait: Secondary | ICD-10-CM | POA: Diagnosis not present

## 2016-06-14 DIAGNOSIS — K5792 Diverticulitis of intestine, part unspecified, without perforation or abscess without bleeding: Secondary | ICD-10-CM | POA: Diagnosis not present

## 2016-06-14 DIAGNOSIS — M6281 Muscle weakness (generalized): Secondary | ICD-10-CM | POA: Diagnosis not present

## 2016-06-14 DIAGNOSIS — G894 Chronic pain syndrome: Secondary | ICD-10-CM | POA: Diagnosis not present

## 2016-06-15 DIAGNOSIS — M150 Primary generalized (osteo)arthritis: Secondary | ICD-10-CM | POA: Diagnosis not present

## 2016-06-15 DIAGNOSIS — G894 Chronic pain syndrome: Secondary | ICD-10-CM | POA: Diagnosis not present

## 2016-06-15 DIAGNOSIS — M4806 Spinal stenosis, lumbar region: Secondary | ICD-10-CM | POA: Diagnosis not present

## 2016-06-15 DIAGNOSIS — K5792 Diverticulitis of intestine, part unspecified, without perforation or abscess without bleeding: Secondary | ICD-10-CM | POA: Diagnosis not present

## 2016-06-15 DIAGNOSIS — R260 Ataxic gait: Secondary | ICD-10-CM | POA: Diagnosis not present

## 2016-06-15 DIAGNOSIS — M6281 Muscle weakness (generalized): Secondary | ICD-10-CM | POA: Diagnosis not present

## 2016-06-16 DIAGNOSIS — M4806 Spinal stenosis, lumbar region: Secondary | ICD-10-CM | POA: Diagnosis not present

## 2016-06-16 DIAGNOSIS — R260 Ataxic gait: Secondary | ICD-10-CM | POA: Diagnosis not present

## 2016-06-16 DIAGNOSIS — M150 Primary generalized (osteo)arthritis: Secondary | ICD-10-CM | POA: Diagnosis not present

## 2016-06-16 DIAGNOSIS — M6281 Muscle weakness (generalized): Secondary | ICD-10-CM | POA: Diagnosis not present

## 2016-06-16 DIAGNOSIS — G894 Chronic pain syndrome: Secondary | ICD-10-CM | POA: Diagnosis not present

## 2016-06-16 DIAGNOSIS — K5792 Diverticulitis of intestine, part unspecified, without perforation or abscess without bleeding: Secondary | ICD-10-CM | POA: Diagnosis not present

## 2016-06-19 DIAGNOSIS — M4806 Spinal stenosis, lumbar region: Secondary | ICD-10-CM | POA: Diagnosis not present

## 2016-06-19 DIAGNOSIS — G894 Chronic pain syndrome: Secondary | ICD-10-CM | POA: Diagnosis not present

## 2016-06-19 DIAGNOSIS — M150 Primary generalized (osteo)arthritis: Secondary | ICD-10-CM | POA: Diagnosis not present

## 2016-06-19 DIAGNOSIS — K5792 Diverticulitis of intestine, part unspecified, without perforation or abscess without bleeding: Secondary | ICD-10-CM | POA: Diagnosis not present

## 2016-06-19 DIAGNOSIS — R260 Ataxic gait: Secondary | ICD-10-CM | POA: Diagnosis not present

## 2016-06-19 DIAGNOSIS — M6281 Muscle weakness (generalized): Secondary | ICD-10-CM | POA: Diagnosis not present

## 2016-06-20 DIAGNOSIS — G894 Chronic pain syndrome: Secondary | ICD-10-CM | POA: Diagnosis not present

## 2016-06-20 DIAGNOSIS — K5792 Diverticulitis of intestine, part unspecified, without perforation or abscess without bleeding: Secondary | ICD-10-CM | POA: Diagnosis not present

## 2016-06-20 DIAGNOSIS — M6281 Muscle weakness (generalized): Secondary | ICD-10-CM | POA: Diagnosis not present

## 2016-06-20 DIAGNOSIS — M4806 Spinal stenosis, lumbar region: Secondary | ICD-10-CM | POA: Diagnosis not present

## 2016-06-20 DIAGNOSIS — M150 Primary generalized (osteo)arthritis: Secondary | ICD-10-CM | POA: Diagnosis not present

## 2016-06-20 DIAGNOSIS — R260 Ataxic gait: Secondary | ICD-10-CM | POA: Diagnosis not present

## 2016-06-21 DIAGNOSIS — G894 Chronic pain syndrome: Secondary | ICD-10-CM | POA: Diagnosis not present

## 2016-06-21 DIAGNOSIS — R260 Ataxic gait: Secondary | ICD-10-CM | POA: Diagnosis not present

## 2016-06-21 DIAGNOSIS — M6281 Muscle weakness (generalized): Secondary | ICD-10-CM | POA: Diagnosis not present

## 2016-06-21 DIAGNOSIS — M150 Primary generalized (osteo)arthritis: Secondary | ICD-10-CM | POA: Diagnosis not present

## 2016-06-21 DIAGNOSIS — M4806 Spinal stenosis, lumbar region: Secondary | ICD-10-CM | POA: Diagnosis not present

## 2016-06-21 DIAGNOSIS — K5792 Diverticulitis of intestine, part unspecified, without perforation or abscess without bleeding: Secondary | ICD-10-CM | POA: Diagnosis not present

## 2016-06-22 DIAGNOSIS — K5792 Diverticulitis of intestine, part unspecified, without perforation or abscess without bleeding: Secondary | ICD-10-CM | POA: Diagnosis not present

## 2016-06-22 DIAGNOSIS — R260 Ataxic gait: Secondary | ICD-10-CM | POA: Diagnosis not present

## 2016-06-22 DIAGNOSIS — G894 Chronic pain syndrome: Secondary | ICD-10-CM | POA: Diagnosis not present

## 2016-06-22 DIAGNOSIS — M150 Primary generalized (osteo)arthritis: Secondary | ICD-10-CM | POA: Diagnosis not present

## 2016-06-22 DIAGNOSIS — M6281 Muscle weakness (generalized): Secondary | ICD-10-CM | POA: Diagnosis not present

## 2016-06-22 DIAGNOSIS — M4806 Spinal stenosis, lumbar region: Secondary | ICD-10-CM | POA: Diagnosis not present

## 2016-06-27 DIAGNOSIS — K5792 Diverticulitis of intestine, part unspecified, without perforation or abscess without bleeding: Secondary | ICD-10-CM | POA: Diagnosis not present

## 2016-06-27 DIAGNOSIS — M4806 Spinal stenosis, lumbar region: Secondary | ICD-10-CM | POA: Diagnosis not present

## 2016-06-27 DIAGNOSIS — M6281 Muscle weakness (generalized): Secondary | ICD-10-CM | POA: Diagnosis not present

## 2016-06-27 DIAGNOSIS — R260 Ataxic gait: Secondary | ICD-10-CM | POA: Diagnosis not present

## 2016-06-27 DIAGNOSIS — M150 Primary generalized (osteo)arthritis: Secondary | ICD-10-CM | POA: Diagnosis not present

## 2016-06-27 DIAGNOSIS — G894 Chronic pain syndrome: Secondary | ICD-10-CM | POA: Diagnosis not present

## 2016-06-28 DIAGNOSIS — R700 Elevated erythrocyte sedimentation rate: Secondary | ICD-10-CM | POA: Diagnosis not present

## 2016-06-28 DIAGNOSIS — R5381 Other malaise: Secondary | ICD-10-CM | POA: Diagnosis not present

## 2016-06-28 DIAGNOSIS — M353 Polymyalgia rheumatica: Secondary | ICD-10-CM | POA: Diagnosis not present

## 2016-06-29 DIAGNOSIS — G894 Chronic pain syndrome: Secondary | ICD-10-CM | POA: Diagnosis not present

## 2016-06-29 DIAGNOSIS — R260 Ataxic gait: Secondary | ICD-10-CM | POA: Diagnosis not present

## 2016-06-29 DIAGNOSIS — M6281 Muscle weakness (generalized): Secondary | ICD-10-CM | POA: Diagnosis not present

## 2016-06-29 DIAGNOSIS — M150 Primary generalized (osteo)arthritis: Secondary | ICD-10-CM | POA: Diagnosis not present

## 2016-06-29 DIAGNOSIS — K5792 Diverticulitis of intestine, part unspecified, without perforation or abscess without bleeding: Secondary | ICD-10-CM | POA: Diagnosis not present

## 2016-06-29 DIAGNOSIS — M4806 Spinal stenosis, lumbar region: Secondary | ICD-10-CM | POA: Diagnosis not present

## 2016-07-03 DIAGNOSIS — K5792 Diverticulitis of intestine, part unspecified, without perforation or abscess without bleeding: Secondary | ICD-10-CM | POA: Diagnosis not present

## 2016-07-03 DIAGNOSIS — G894 Chronic pain syndrome: Secondary | ICD-10-CM | POA: Diagnosis not present

## 2016-07-03 DIAGNOSIS — M4806 Spinal stenosis, lumbar region: Secondary | ICD-10-CM | POA: Diagnosis not present

## 2016-07-03 DIAGNOSIS — M150 Primary generalized (osteo)arthritis: Secondary | ICD-10-CM | POA: Diagnosis not present

## 2016-07-03 DIAGNOSIS — M6281 Muscle weakness (generalized): Secondary | ICD-10-CM | POA: Diagnosis not present

## 2016-07-03 DIAGNOSIS — R260 Ataxic gait: Secondary | ICD-10-CM | POA: Diagnosis not present

## 2016-07-06 DIAGNOSIS — M6281 Muscle weakness (generalized): Secondary | ICD-10-CM | POA: Diagnosis not present

## 2016-07-06 DIAGNOSIS — G894 Chronic pain syndrome: Secondary | ICD-10-CM | POA: Diagnosis not present

## 2016-07-06 DIAGNOSIS — R260 Ataxic gait: Secondary | ICD-10-CM | POA: Diagnosis not present

## 2016-07-06 DIAGNOSIS — M4806 Spinal stenosis, lumbar region: Secondary | ICD-10-CM | POA: Diagnosis not present

## 2016-07-06 DIAGNOSIS — M150 Primary generalized (osteo)arthritis: Secondary | ICD-10-CM | POA: Diagnosis not present

## 2016-07-06 DIAGNOSIS — K5792 Diverticulitis of intestine, part unspecified, without perforation or abscess without bleeding: Secondary | ICD-10-CM | POA: Diagnosis not present

## 2016-07-07 DIAGNOSIS — M353 Polymyalgia rheumatica: Secondary | ICD-10-CM | POA: Diagnosis not present

## 2016-07-07 DIAGNOSIS — Z09 Encounter for follow-up examination after completed treatment for conditions other than malignant neoplasm: Secondary | ICD-10-CM | POA: Diagnosis not present

## 2016-07-07 DIAGNOSIS — K5792 Diverticulitis of intestine, part unspecified, without perforation or abscess without bleeding: Secondary | ICD-10-CM | POA: Diagnosis not present

## 2016-07-07 DIAGNOSIS — J069 Acute upper respiratory infection, unspecified: Secondary | ICD-10-CM | POA: Diagnosis not present

## 2016-07-12 DIAGNOSIS — R260 Ataxic gait: Secondary | ICD-10-CM | POA: Diagnosis not present

## 2016-07-12 DIAGNOSIS — M150 Primary generalized (osteo)arthritis: Secondary | ICD-10-CM | POA: Diagnosis not present

## 2016-07-12 DIAGNOSIS — M4806 Spinal stenosis, lumbar region: Secondary | ICD-10-CM | POA: Diagnosis not present

## 2016-07-12 DIAGNOSIS — G894 Chronic pain syndrome: Secondary | ICD-10-CM | POA: Diagnosis not present

## 2016-07-12 DIAGNOSIS — K5792 Diverticulitis of intestine, part unspecified, without perforation or abscess without bleeding: Secondary | ICD-10-CM | POA: Diagnosis not present

## 2016-07-12 DIAGNOSIS — M6281 Muscle weakness (generalized): Secondary | ICD-10-CM | POA: Diagnosis not present

## 2016-09-05 ENCOUNTER — Telehealth: Payer: Self-pay | Admitting: Rheumatology

## 2016-09-05 MED ORDER — PREDNISONE 5 MG PO TABS: 5.0000 mg | ORAL_TABLET | Freq: Every day | ORAL | 0 refills | Status: DC

## 2016-09-05 NOTE — Telephone Encounter (Signed)
Patient is requesting refill of Prednisone 5mg . Please send to [facility].

## 2016-09-05 NOTE — Telephone Encounter (Signed)
Office notes indicate continue prednisone taper at 1 mg per month.  In summary she will do, for the month of September 8 mg, October 7 mg, November 6 mg and December will be 5 mg and she will stay on 5 mg. Ok to refill per Dr Estanislado Pandy  Dx PMR

## 2016-09-06 ENCOUNTER — Other Ambulatory Visit: Payer: Self-pay | Admitting: Rheumatology

## 2016-09-06 DIAGNOSIS — Z79899 Other long term (current) drug therapy: Secondary | ICD-10-CM | POA: Diagnosis not present

## 2016-09-07 LAB — CBC WITH DIFFERENTIAL/PLATELET
Basophils Absolute: 0 10*3/uL (ref 0.0–0.2)
Basos: 0 %
EOS (ABSOLUTE): 0.3 10*3/uL (ref 0.0–0.4)
Eos: 3 %
Hematocrit: 35.8 % (ref 34.0–46.6)
Hemoglobin: 11.9 g/dL (ref 11.1–15.9)
Immature Grans (Abs): 0 10*3/uL (ref 0.0–0.1)
Immature Granulocytes: 0 %
Lymphocytes Absolute: 4.2 10*3/uL — ABNORMAL HIGH (ref 0.7–3.1)
Lymphs: 40 %
MCH: 28.7 pg (ref 26.6–33.0)
MCHC: 33.2 g/dL (ref 31.5–35.7)
MCV: 87 fL (ref 79–97)
Monocytes Absolute: 0.8 10*3/uL (ref 0.1–0.9)
Monocytes: 7 %
Neutrophils Absolute: 5.1 10*3/uL (ref 1.4–7.0)
Neutrophils: 50 %
Platelets: 321 10*3/uL (ref 150–379)
RBC: 4.14 x10E6/uL (ref 3.77–5.28)
RDW: 15.2 % (ref 12.3–15.4)
WBC: 10.4 10*3/uL (ref 3.4–10.8)

## 2016-09-07 LAB — CMP14+EGFR
ALT: 21 IU/L (ref 0–32)
AST: 21 IU/L (ref 0–40)
Albumin/Globulin Ratio: 1.4 (ref 1.2–2.2)
Albumin: 4.1 g/dL (ref 3.5–4.8)
Alkaline Phosphatase: 119 IU/L — ABNORMAL HIGH (ref 39–117)
BUN/Creatinine Ratio: 18 (ref 12–28)
BUN: 21 mg/dL (ref 8–27)
Bilirubin Total: 0.3 mg/dL (ref 0.0–1.2)
CO2: 27 mmol/L (ref 18–29)
Calcium: 9.6 mg/dL (ref 8.7–10.3)
Chloride: 100 mmol/L (ref 96–106)
Creatinine, Ser: 1.17 mg/dL — ABNORMAL HIGH (ref 0.57–1.00)
GFR calc Af Amer: 55 mL/min/{1.73_m2} — ABNORMAL LOW (ref >59)
GFR calc non Af Amer: 47 mL/min/{1.73_m2} — ABNORMAL LOW (ref >59)
Globulin, Total: 2.9 g/dL (ref 1.5–4.5)
Glucose: 67 mg/dL (ref 65–99)
Potassium: 4.3 mmol/L (ref 3.5–5.2)
Sodium: 142 mmol/L (ref 134–144)
Total Protein: 7 g/dL (ref 6.0–8.5)

## 2016-09-07 NOTE — Progress Notes (Signed)
Please review labs. 

## 2016-09-08 NOTE — Progress Notes (Signed)
Ok to monitor for now

## 2016-09-12 ENCOUNTER — Telehealth: Payer: Self-pay | Admitting: Radiology

## 2016-09-12 NOTE — Telephone Encounter (Signed)
Called patient to advise  °

## 2016-09-12 NOTE — Telephone Encounter (Signed)
-----   Message from Bo Merino, MD sent at 09/08/2016  8:21 AM EST ----- Ok to monitor for now.

## 2016-09-20 ENCOUNTER — Ambulatory Visit (INDEPENDENT_AMBULATORY_CARE_PROVIDER_SITE_OTHER): Payer: Medicare Other | Admitting: Neurology

## 2016-09-20 ENCOUNTER — Encounter: Payer: Self-pay | Admitting: Neurology

## 2016-09-20 ENCOUNTER — Ambulatory Visit (HOSPITAL_COMMUNITY): Payer: Medicare Other | Attending: Rheumatology

## 2016-09-20 VITALS — BP 125/77 | HR 81 | Ht 61.0 in | Wt 252.5 lb

## 2016-09-20 DIAGNOSIS — R262 Difficulty in walking, not elsewhere classified: Secondary | ICD-10-CM

## 2016-09-20 DIAGNOSIS — M25551 Pain in right hip: Secondary | ICD-10-CM

## 2016-09-20 DIAGNOSIS — G8929 Other chronic pain: Secondary | ICD-10-CM | POA: Diagnosis not present

## 2016-09-20 DIAGNOSIS — M5442 Lumbago with sciatica, left side: Secondary | ICD-10-CM | POA: Insufficient documentation

## 2016-09-20 DIAGNOSIS — M48062 Spinal stenosis, lumbar region with neurogenic claudication: Secondary | ICD-10-CM | POA: Diagnosis not present

## 2016-09-20 MED ORDER — GABAPENTIN 100 MG PO CAPS: 200.0000 mg | ORAL_CAPSULE | Freq: Three times a day (TID) | ORAL | 4 refills | Status: DC

## 2016-09-20 NOTE — Progress Notes (Signed)
Chief Complaint  Patient presents with  . Lumbar Stenosis    She had her last ESI with Dr. Nelva Bush in 05/2016.  Her pain has started to worsen again.  She starts physical therapy today.  . Cervical Stenosis    She would like to review her MRI scan.  She has not been able to follow up with Dr. Trenton Gammon due to lack of transportation.      PATIENT: Nicole [name] DOB: April 27, 1946  Chief Complaint  Patient presents with  . Lumbar Stenosis    She had her last ESI with Dr. Nelva Bush in 05/2016.  Her pain has started to worsen again.  She starts physical therapy today.  . Cervical Stenosis    She would like to review her MRI scan.  She has not been able to follow up with Dr. Trenton Gammon due to lack of transportation.     HISTORICAL  Nicole [name] is a 70 years old right-handed female, accompanied by her sister-in-law Hoyle Sauer, seen in refer by her primary care physician  Dr. Sharilyn Sites for evaluation of diffuse body achy pain, gait difficulty in May ninth 2017.  I reviewed and summarized referring note, she had a history of hypertension, hyperlipidemia, osteoarthritis, history of chronic low back pain, lumbar stenosis, has been treated by pain management Dr. Nelva Bush since 2013  She had a history of chronic low back pain, progressively worse over the past few years, radiating pain to bilateral lower extremity, left worse than right, left leg give out underneath her, prior to December 2016, she was able to ambulate independently, but had rapid decline since, she complains of diffuse body achy pain, multiple joints pain, worsening gait difficulty, has to rely on her walker is to take few steps at her house,  She denies bowel and bladder incontinence, she also complains of worsening nocturia, she is now taking Nucynta 50 mg 3 times a day, with limited help of her pain, complains of drowsiness Per record, in April 2017, she was treated with methylprednisolone 4 mg tapering dose, diclofenac 75 mg twice a day, muscle  relaxant Flexeril 5 mg every night, patient reported limited help from those polypharmacy. Tramadol makes her confused, falling backwards  I reviewed laboratory evaluation in 2017, normal CBC, WBC 6.5, hemoglobin 13. 4, normal CMP, with exception of elevated creatinine 1.1 9, elevated LDL 177, cholesterol 266, triglycerides right 165, mildly low vitamin D 25.9, urine culture was negative,  rheumatoid factor was elevated 44.5, ANA was negative, Lyme titer was negative 0.9, hepatitis function showed mild elevated ALT 39, hepatitis panel A, B, C was negative, ionized calcium was normal 5.3, alkaline phosphate was high at 120, normal TSH 1.96, cortisone level was normal folic acid was normal 8.6, vitamin B12 597, PTH 48, esr rate was 56 elevated  I was able to review her MRI of lumbar in 2009 from [facility] radiologist, severe lumbar stenosis at L 3-4, MRI of cervical spine multilevel cervical degenerative disc disease, no significant canal stenosis, no cord signal changes. MRI of the brain, mild atrophy, no acute lesions.  She lives alone, can barely maintaining independence because of her muscle pain, joints pain gait abnormality  UPDATE April 13 2016:  She came in for electrodiagnostic study today, which demonstrate active bilateral lumbar sacral radiculopathy. She has severe low back pain, limited range of motion, unsteady gait, at risk for fall,  she denies bowel and bladder incontinence,  I have referred her to neurosurgeon for lumbar decompression surgery,  UPDATE July 18th 2017:  She was seen by Dr. Trenton Gammon in July, who recommended epidural injection by Dr. Nelva Bush, will try injection first, if not responsive, may consider surgery,  She was offered to have lumbar decompression surgery in 2009, she decided to hold off then.    She now complains of severe low back pain, left side radiating pain, she has no bowel and bladder incontinence, she has significant abnormality,   We have personally reviewed MRI  lumbar in July 2017: showing severe spinal canal stenosis at L3-4 and L4-5 with bilateral severe foraminal narrowing at these levels with impingement on the exiting nerve roots. L1-L2 shows large disc osteophyte protrusion with severe foraminal compression on the right and encroachment on exiting nerve roots as well  UPDATE Nov 29th 2017: She continues to have significant low back pain, gait abnormality, we again reviewed MRI lumbar in July 2017, there is significant lumbar stenosis at L3-4, L4-5. She denies bowel and bladder incontinence, epidural injection in summer of 2017 only provide temporary relief.  She is also under the care of rheumatologist Dr. Patrecia Pour, taking prednisone 70m daily, per patient, will be switched to Plaquenil soon.  We have personally reviewed MRI of  MRI cervical spine in August 2017, multilevel degenerative disc disease, most severe C6-7, facet hypertrophy, bulging disc, moderate to severe bilateral foraminal stenosis.  REVIEW OF SYSTEMS: Full 14 system review of systems performed and notable only for ringing ears, headache, numbness, weakness, itching  ALLERGIES: Allergies  Allergen Reactions  . Oxycontin [Oxycodone Hcl] Swelling  . Sulfa Antibiotics Other (See Comments)    Sores  . Tramadol     "Makes me feel like I am falling"  . Penicillins Rash    HOME MEDICATIONS: Current Outpatient Prescriptions  Medication Sig Dispense Refill  . gabapentin (NEURONTIN) 100 MG capsule Take 2 capsules (200 mg total) by mouth 3 (three) times daily. 180 capsule 11  . hydroxychloroquine (PLAQUENIL) 200 MG tablet Taking 0.5 tab in am and 1 tab in pm.    . NONFORMULARY OR COMPOUNDED ITEM Apply 1-2 application topically every 8 (eight) hours as needed (Diclofenac 3%, Lidocaine 5%, Menthol 1%     (Cream 100 Gm)).    . NUCYNTA 50 MG TABS tablet Take 50 mg by mouth 3 (three) times daily as needed for moderate pain.     . pantoprazole (PROTONIX) 40 MG tablet Take 40 mg by mouth  daily.     . predniSONE (DELTASONE) 5 MG tablet Take 1 tablet (5 mg total) by mouth daily with breakfast. 90 tablet 0  . valsartan-hydrochlorothiazide (DIOVAN-HCT) 160-25 MG per tablet Take 1 tablet by mouth daily.     No current facility-administered medications for this visit.     PAST MEDICAL HISTORY: Past Medical History:  Diagnosis Date  . AC (acromioclavicular) joint bone spurs    lt shoulder  . Anemia   . Arthritis   . Carpal tunnel syndrome, bilateral   . Gastroesophageal reflux   . Headache    [facility]  . Hypertension   . Lumbar stenosis    Hx of ESIs by Dr. RNelva Bush . Polyarthralgia   . Polymyalgia ([facility]   . Shingles   . Spinal stenosis     PAST SURGICAL HISTORY: Past Surgical History:  Procedure Laterality Date  . CHOLECYSTECTOMY    . COLONOSCOPY  06/11/2012   Procedure: COLONOSCOPY;  Surgeon: MJamesetta So MD;  [facility];  Service: Gastroenterology;  Laterality: N/A;  .  HYSTEROSCOPY W/D&C N/A 02/25/2014   Procedure: DILATATION AND CURETTAGE /HYSTEROSCOPY;  Surgeon: Florian Buff, MD;  Location: [facility];  Service: Gynecology;  Laterality: N/A;  . POLYPECTOMY N/A 02/25/2014   Procedure: POLYPECTOMY;  Surgeon: Florian Buff, MD;  Location: [facility];  Service: Gynecology;  Laterality: N/A;  . RESECTION DISTAL CLAVICAL Right 03/26/2015   Procedure: OPEN DISTAL CLAVICAL RESECTION ;  Surgeon: Netta Cedars, MD;  Location: [facility];  Service: Orthopedics;  Laterality: Right;    FAMILY HISTORY: Family History  Problem Relation Age of Onset  . Hypertension Mother   . Pneumonia Father     SOCIAL HISTORY:  Social History   Social History  . Marital status: Widowed    Spouse name: N/A  . Number of children: 2  . Years of education: 1 yr coll   Occupational History  . Retired    Social History Main Topics  . Smoking status: Never Smoker  . Smokeless tobacco: Never Used  . Alcohol use No  . Drug use: No  .  Sexual activity: Yes    Birth control/ protection: Post-menopausal   Other Topics Concern  . Not on file   Social History Narrative   Lives at home alone.   Right-handed.   1-2 cups caffeine daily.     PHYSICAL EXAM   Vitals:   09/20/16 0943  BP: 125/77  Pulse: 81  Weight: 252 lb 8 oz (114.5 kg)  Height: '5\' 1"'  (1.549 m)    Not recorded      Body mass index is 47.71 kg/m.  PHYSICAL EXAMNIATION:  Gen: NAD, conversant, well nourised, obese, well groomed                     Cardiovascular: Regular rate rhythm, no peripheral edema, warm, nontender. Eyes: Conjunctivae clear without exudates or hemorrhage Neck: Supple, no carotid bruise. Pulmonary: Clear to auscultation bilaterally   NEUROLOGICAL EXAM:  MENTAL STATUS: Speech:    Speech is normal; fluent and spontaneous with normal comprehension.  Cognition:     Orientation to time, place and person     Normal recent and remote memory     Normal Attention span and concentration     Normal Language, naming, repeating,spontaneous speech     Fund of knowledge   CRANIAL NERVES: CN II: Visual fields are full to confrontation. Fundoscopic exam is normal with sharp discs and no vascular changes. Pupils are round equal and briskly reactive to light. CN III, IV, VI: extraocular movement are normal. No ptosis. CN V: Facial sensation is intact to pinprick in all 3 divisions bilaterally. Corneal responses are intact.  CN VII: Face is symmetric with normal eye closure and smile. CN VIII: Hearing is normal to rubbing fingers CN IX, X: Palate elevates symmetrically. Phonation is normal. CN XI: Head turning and shoulder shrug are intact CN XII: Tongue is midline with normal movements and no atrophy.  MOTOR: She has mild bilateral shoulder abduction, external rotation, grip weak, she has mild bilateral hip flexion, knee flexion extension weakness, ankle dorsiflexion weakness, left worse than right, left side 2/5, right side  4/5  REFLEXES: Reflexes are 1 and symmetric at the biceps, triceps, knees, and absent at ankles. Plantar responses are flexor.  SENSORY: Length dependent decreased to light touch and vibratory sensation to distal shin level   Coordination: There was no no dysmetria on finger-to-nose and heel-knee-shin.    GAIT/STANCE: Need assistant to get up from seated position, antalgic, with left  foot drop, unsteady, rely on her walker   DIAGNOSTIC DATA (LABS, IMAGING, TESTING) - I reviewed patient records, labs, notes, testing and imaging myself where available.  ASSESSMENT AND PLAN  Sangeeta Youse is a 70 y.o. female    Severe lumbar stenosis  Evidence of active bilateral lumbosacral radiculopathy, active denervation at bilateral lumbosacral paraspinal muscles by EMG nerve conduction study in July 2017.  She has profound bilateral lower extremity weakness, left worse than right, due to long history of severe lumbar stenosis,  After discussed with patient she has tried and failed epidural injection, long-term conservative management, will refer her back to neurosurgeon  Refilled gabapentin  Neck pain, bilateral upper extremity paresthesia and weakness . Evidence of cervical spondylitic disease, moderate to severe C6-7 foraminal stenosis.     Marcial Pacas, M.D. Ph.D.  [facility] [address] [place], [place] [address] Ph: [phone] Fax: [phone]  XA:QWBE Hilma Favors, MD  Patient aware of results and will keep pending appt.

## 2016-09-20 NOTE — Therapy (Signed)
[facility] [place], [place], [address] Phone: [phone]   Fax:  [phone]  Physical Therapy Evaluation  Patient Details  Name: Nicole [name] MRN: [id number] Date of Birth: December 25, 1945 Referring Provider: Bo Merino   Encounter Date: 09/20/2016      PT End of Session - 09/20/16 2109    Visit Number 1   Number of Visits 18   Date for PT Re-Evaluation 10/13/16   Authorization Type Medicare    Authorization Time Period 09/20/16- 11/01/16   Authorization - Visit Number 1   Authorization - Number of Visits 10   PT Start Time 1601   PT Stop Time 0932   PT Time Calculation (min) 44 min   Activity Tolerance Patient limited by fatigue;Patient limited by pain;Patient limited by lethargy   Behavior During Therapy [facility] for tasks assessed/performed      Past Medical History:  Diagnosis Date  . AC (acromioclavicular) joint bone spurs    lt shoulder  . Anemia   . Arthritis   . Carpal tunnel syndrome, bilateral   . Gastroesophageal reflux   . Headache    recent visit to ER @ [facility] for severe headache  . Hypertension   . Lumbar stenosis    Hx of ESIs by Dr. Nelva Bush  . Polyarthralgia   . Polymyalgia ([facility])   . Shingles   . Spinal stenosis     Past Surgical History:  Procedure Laterality Date  . CHOLECYSTECTOMY    . COLONOSCOPY  06/11/2012   Procedure: COLONOSCOPY;  Surgeon: Jamesetta So, MD;  [facility];  Service: Gastroenterology;  Laterality: N/A;  . HYSTEROSCOPY W/D&C N/A 02/25/2014   Procedure: DILATATION AND CURETTAGE /HYSTEROSCOPY;  Surgeon: Florian Buff, MD;  [facility];  Service: Gynecology;  Laterality: N/A;  . POLYPECTOMY N/A 02/25/2014   Procedure: POLYPECTOMY;  Surgeon: Florian Buff, MD;  Location[facility];  Service: Gynecology;  Laterality: N/A;  . RESECTION DISTAL CLAVICAL Right 03/26/2015   Procedure: OPEN DISTAL CLAVICAL RESECTION ;  Surgeon: Netta Cedars, MD;  Location: [facility];   Service: Orthopedics;  Laterality: Right;    There were no vitals filed for this visit.       Subjective Assessment - 09/20/16 1310    Subjective Pt reports she has a lot of low back pain and pain in both of her legs, also with some drop foot on the Lt side related to the back.    Pertinent History Pt had PT at this location for a related problem in 2012, with good resolution inititally, but January 2017 came back and gradually getting worse. Spinal stenosis diagnosed. Pt reports her weight has been stable for most of her adult years. She has needed a RW since ~March due to progressive weakness, previosuly SPC intermittently for years. Foot drop reported to become more noticeable this year also.    Limitations Standing;Walking   How long can you sit comfortably? Not limited   How long can you stand comfortably? 3-4 minutes    How long can you walk comfortably? 13ft bursts in clinic;    Diagnostic tests Medical imaging.    Patient Stated Goals Return to walking and being more independent with ADL, meals. About 2 years, pt had no limitations walking for prolonged.    Currently in Pain? Yes   Pain Score 9    Pain Location --  center of her lumbosacral junction into the buttocks   Pain Descriptors /  Indicators Sharp;Cramping  "hot"   Pain Type Chronic pain   Pain Onset More than a month ago  January 2017   Pain Frequency Several days a week   Aggravating Factors  Sitting and standing;    Pain Relieving Factors positional changes   Multiple Pain Sites Yes   Pain Score 4  R ankle, anterolateral    Pain Score 4  Feels ice cold, tingling             [facility] PT Assessment - 09/20/16 0001      Assessment   Medical Diagnosis Lumbar spinal stenosis with neurogenic claudication   Referring Provider Bo Merino    Onset Date/Surgical Date 10/24/15  estiamted   Next MD Visit not sure   Prior Therapy 2012 Out patient PT, and HHPT October 2017     Precautions   Precautions None      Restrictions   Weight Bearing Restrictions No     Balance Screen   Has the patient fallen in the past 6 months No   Has the patient had a decrease in activity level because of a fear of falling?  Yes   Is the patient reluctant to leave their home because of a fear of falling?  Yes     [place] residence   Living Arrangements Alone   Available Help at Discharge Family;Friend(s)  daughter and brother assist with IADL   Type of [place] to enter  2 partial steps   Entrance Stairs-Number of Steps 2   Entrance Stairs-Rails Can reach both   [place] One level   [place] - 2 wheels;Walker - 4 wheels;Cane - single point  would benefit from shower chair     Prior Function   Level of Independence Independent with household mobility with device;Needs assistance with ADLs   Toileting Supervision/set-up   Dressing Supervision/set-up   Comments bathing required mod assist     Observation/Other Assessments   Focus on Therapeutic Outcomes (FOTO)  FOTO: 23 (77% impairment)      Observation/Other Assessments-Edema    Edema --  BIalt noted pedal edema with pitting noted at sockline.     Sensation   Light Touch Impaired Detail   Light Touch Impaired Details Impaired LLE  decreased Lt: L4, L5, S1 (stocking paresthisia)     ROM / Strength   AROM / PROM / Strength Strength     Strength   Strength Assessment Site Hip;Knee   Right/Left Hip Right;Left   Right Hip Flexion 4-/5   Right Hip ABduction 4/5  seated, hip at 90 degrees   Right Hip ADduction 4-/5   Left Hip ABduction 3-/5  seated, hip at 90 degrees   Left Hip ADduction 3-/5   Right/Left Knee Right;Left   Right Knee Flexion 3+/5   Right Knee Extension 3+/5   Left Knee Flexion 3-/5   Left Knee Extension 3-/5     Transfers   Transfers Sit to Stand   Sit to Stand 4: Min guard   Number of Reps 1 set   Comments over relainace on quads and  rockingg, unsafe     Ambulation/Gait   Ambulation Distance (Feet) 20 Feet   Assistive device Rollator   Gait velocity 0.38m/s   Gait Comments physically exahusted, unable to go farther, Lt drop foot, heavy UE use on AD  PT Education - 09/20/16 02-13-08    Education provided No          PT Short Term Goals - 09/20/16 12-Feb-2117      PT SHORT TERM GOAL #1   Title After 3 weeks patient will demonstrate improved strength AEB 5xSTS s AD in <30 seconds.    Status New     PT SHORT TERM GOAL #2   Title After 3 weeks pt will demonstrate improved tolerance to AMB able to sustain 6MWT at speed >0.41m/s.    Status New     PT SHORT TERM GOAL #3   Title After 3 weeks pt will report three ways to manage symptoms at home and demonstrate independence in a starter HEP established by PT.    Status New           PT Long Term Goals - 09/20/16 02-12-22      PT LONG TERM GOAL #1   Title After 6 weeks patient will demonstrate improved strength AEB 5xSTS s AD in <20 seconds.    Status New     PT LONG TERM GOAL #2   Title After 6 weeks pt will demonstrate improved tolerance to AMB able to sustain 6MWT at speed >0.33m/s.   Status New     PT LONG TERM GOAL #3   Title After 6 weeks pt will demonstrate independence in an advanced HEP established by PT.    Status New     PT LONG TERM GOAL #4   Title After 6 weeks pt will demonstrate improved strength in BLE AEB 4/5 strength in all BLE groups tested at evaluation.    Status New               Plan - 09/20/16 02-13-2111    Clinical Impression Statement Pt presenting with progressive weakness, deconditioning, left foot drop, and R posterior hip pain since onset approximately 10 months. Pt demonstrating decreased light touch sensation, radicular signs and nerve pain on the LLE, pain in the Rt posterio gluteal muscles limiting activity and comfort with seleep at night. Most limited is AMB which at best performance  allows patient to AMB <109ft maximal distance at a speed of 0.48m/s, indicative of high falls risk even with simple hosuehold mobility.  Pt will benefit from skilled PT intervention to address these impairments and improve patients ability to perform safe household distances, and regain independnece in self care and ADL.    Rehab Potential Fair   PT Frequency 3x / week   PT Duration 6 weeks   PT Treatment/Interventions Therapeutic exercise;Therapeutic activities;Functional mobility training;Stair training;Gait training;Balance training;Cognitive remediation;Patient/family education;Moist Heat;Passive range of motion;Dry needling   PT Next Visit Plan Review treatment goals, establish HEP    PT Home Exercise Plan Review at second session: seated exercise program.    Consulted and Agree with Plan of Care Patient      Patient will benefit from skilled therapeutic intervention in order to improve the following deficits and impairments:  Abnormal gait, Impaired sensation, Improper body mechanics, Pain, Decreased activity tolerance, Difficulty walking, Decreased strength, Obesity  Visit Diagnosis: Pain in right hip - Plan: PT plan of care cert/re-cert  Difficulty in walking, not elsewhere classified - Plan: PT plan of care cert/re-cert  Chronic midline low back pain with left-sided sciatica - Plan: PT plan of care cert/re-cert      G-Codes - 09/47/09 13-Feb-2124    Functional Assessment Tool Used FOTO:27   Functional Limitation Mobility: Walking and moving around  Mobility: Walking and Moving Around Current Status [phone]) At least 80 percent but less than 100 percent impaired, limited or restricted   Mobility: Walking and Moving Around Goal Status [phone]) At least 60 percent but less than 80 percent impaired, limited or restricted       Problem List Patient Active Problem List   Diagnosis Date Noted  . HTN (hypertension) 06/05/2016  . Chronic pain syndrome 06/05/2016  . Acute diverticulitis  06/02/2016  . Diverticulitis large intestine 06/02/2016  . Spinal stenosis of lumbar region 02/29/2016  . Joint pain 02/29/2016  . Endometrial polyp 02/06/2014  . Postmenopausal bleeding 01/30/2014  . GERD (gastroesophageal reflux disease) 09/29/2013  . Spinal stenosis, thoracic 09/29/2013  . Shingles 09/29/2013  . Thoracic or lumbosacral neuritis or radiculitis, unspecified 05/04/2011  . Abnormality of gait 05/04/2011  . Muscle weakness (generalized) 05/04/2011  . KNEE, ARTHRITIS, DEGEN./OSTEO 04/07/2009  . KNEE PAIN 04/07/2009    9:28 PM, 09/20/16 [name], PT, DPT Physical Therapist at [facility] [phone] (office)     [facility] [address] [place], [place], [address] Phone: [phone]   Fax:  [phone]  Name: Fatuma Dowers MRN: [id number] Date of Birth: 1946/08/08

## 2016-09-26 ENCOUNTER — Ambulatory Visit (HOSPITAL_COMMUNITY): Payer: Medicare Other | Attending: Rheumatology

## 2016-09-26 DIAGNOSIS — M25551 Pain in right hip: Secondary | ICD-10-CM | POA: Diagnosis not present

## 2016-09-26 DIAGNOSIS — R262 Difficulty in walking, not elsewhere classified: Secondary | ICD-10-CM | POA: Diagnosis not present

## 2016-09-26 DIAGNOSIS — M5442 Lumbago with sciatica, left side: Secondary | ICD-10-CM | POA: Diagnosis not present

## 2016-09-26 DIAGNOSIS — G8929 Other chronic pain: Secondary | ICD-10-CM

## 2016-09-26 NOTE — Patient Instructions (Addendum)
Toe / Heel Raise (Sitting)    Sitting, raise heels, then rock back on heels and raise toes. Repeat 10-20 times.  Copyright  VHI. All rights reserved.   Seated Horse Stance With Alignment Awareness    Sit in a comfortable position. Bring attention to the present moment, pausing to make subtle adjustments. Body is upright and naturally aligned. Notice breathing...then hands....and feet. Return focus to breathing. Practice for 2-5 minutes.  Copyright  VHI. All rights reserved.   (Home) Knee: Extension - Sitting    Lift leg, straighten knee. Repeat 10 times per set. Do 2 sets per session. Do 4 sessions per week.  Copyright  VHI. All rights reserved.    ABDUCTION: Sitting - Resistance Band (Active)    Sit with feet flat. Apply theraband around knees and separate knees and hold for 3-5" Complete 2 sets of 10 repetitions. Perform 2 sessions per day.  Copyright  VHI. All rights reserved.

## 2016-09-26 NOTE — Therapy (Signed)
[place], [place], [address] Phone: [phone]   Fax:  [phone]  Physical Therapy Treatment  Patient Details  Name: Nicole [name] MRN: [id number] Date of Birth: [birth year] Referring Provider: Bo Merino   Encounter Date: 09/26/2016      PT End of Session - 09/26/16 1825    Visit Number 2   Number of Visits 18   Date for PT Re-Evaluation 10/13/16   Authorization Type Medicare    Authorization Time Period 09/20/16- 11/01/16   Authorization - Visit Number 2   Authorization - Number of Visits 10   PT Start Time 2409   PT Stop Time 1733   PT Time Calculation (min) 43 min   Activity Tolerance Patient limited by fatigue;Patient limited by pain;Patient limited by lethargy   Behavior During Therapy [facility] for tasks assessed/performed      Past Medical History:  Diagnosis Date  . AC (acromioclavicular) joint bone spurs    lt shoulder  . Anemia   . Arthritis   . Carpal tunnel syndrome, bilateral   . Gastroesophageal reflux   . Headache    recent visit to ER @ [facility] for severe headache  . Hypertension   . Lumbar stenosis    Hx of ESIs by Dr. Nelva Bush  . Polyarthralgia   . Polymyalgia ([place])   . Shingles   . Spinal stenosis     Past Surgical History:  Procedure Laterality Date  . CHOLECYSTECTOMY    . COLONOSCOPY  06/11/2012   Procedure: COLONOSCOPY;  Surgeon: Jamesetta So, MD;  [facility];  Service: Gastroenterology;  Laterality: N/A;  . HYSTEROSCOPY W/D&C N/A 02/25/2014   Procedure: DILATATION AND CURETTAGE /HYSTEROSCOPY;  Surgeon: Florian Buff, MD;  Location: [facility];  Service: Gynecology;  Laterality: N/A;  . POLYPECTOMY N/A 02/25/2014   Procedure: POLYPECTOMY;  Surgeon: Florian Buff, MD;  Location: [facility];  Service: Gynecology;  Laterality: N/A;  . RESECTION DISTAL CLAVICAL Right 03/26/2015   Procedure: OPEN DISTAL CLAVICAL RESECTION ;  Surgeon: Netta Cedars, MD;  Location: [place];   Service: Orthopedics;  Laterality: Right;    There were no vitals filed for this visit.      Subjective Assessment - 09/26/16 1657    Subjective Pt stated she is tired today due to time of apt schedulded.  Reports currently she has pain in her back 8/10 and Lt LE pain scale 6/10.   Pertinent History Pt had PT at this location for a related problem in 2012, with good resolution inititally, but January 2017 came back and gradually getting worse. Spinal stenosis diagnosed. Pt reports her weight has been stable for most of her adult years. She has needed a RW since ~March due to progressive weakness, previosuly SPC intermittently for years. Foot drop reported to become more noticeable this year also.    Patient Stated Goals Return to walking and being more independent with ADL, meals. About 2 years, pt had no limitations walking for prolonged.    Currently in Pain? Yes   Pain Score 6    Pain Location Leg   Pain Orientation Left   Pain Descriptors / Indicators Sore;Sharp   Pain Type Chronic pain   Pain Radiating Towards Stinging down Lt knee to ankle   Pain Onset More than a month ago   Pain Frequency Constant   Aggravating Factors  sitting and standing   Pain Relieving Factors positional changes   Effect of Pain  on Daily Activities Decreased ADLs   Multiple Pain Sites Yes   Pain Score 8   Pain Location Back   Pain Orientation Lower   Pain Descriptors / Indicators Shooting   Pain Type Chronic pain                         [facility] Adult PT Treatment/Exercise - 09/26/16 0001      Exercises   Exercises Lumbar     Lumbar Exercises: Seated   Long Arc Quad on Chair 2 sets;10 reps   LAQ on Chair Limitations on chair   Other Seated Lumbar Exercises Posture awareness 2 min; scapular retraction; abdominal sets 5x 5";  Abd with RTB; ADD with pillow                PT Education - 09/26/16 1834    Education provided Yes   Education Details Reviewed goals,  established/instructed HEP, copy of eval given to pt   Person(s) Educated Patient;Other (comment)  grandchildren sat through apt   Methods Explanation;Demonstration;Verbal cues;Handout   Comprehension Verbalized understanding;Returned demonstration;Need further instruction          PT Short Term Goals - 09/20/16 2118      PT SHORT TERM GOAL #1   Title After 3 weeks patient will demonstrate improved strength AEB 5xSTS s AD in <30 seconds.    Status New     PT SHORT TERM GOAL #2   Title After 3 weeks pt will demonstrate improved tolerance to AMB able to sustain 6MWT at speed >0.43m/s.    Status New     PT SHORT TERM GOAL #3   Title After 3 weeks pt will report three ways to manage symptoms at home and demonstrate independence in a starter HEP established by PT.    Status New           PT Long Term Goals - 09/20/16 2123      PT LONG TERM GOAL #1   Title After 6 weeks patient will demonstrate improved strength AEB 5xSTS s AD in <20 seconds.    Status New     PT LONG TERM GOAL #2   Title After 6 weeks pt will demonstrate improved tolerance to AMB able to sustain 6MWT at speed >0.57m/s.   Status New     PT LONG TERM GOAL #3   Title After 6 weeks pt will demonstrate independence in an advanced HEP established by PT.    Status New     PT LONG TERM GOAL #4   Title After 6 weeks pt will demonstrate improved strength in BLE AEB 4/5 strength in all BLE groups tested at evaluation.    Status New               Plan - 09/26/16 1826    Clinical Impression Statement Pt entered dept stating she is exhausted by the late apt time and increased pain in lower back and LE.  Session focus on education with POC and proximal musculature strengthening.  Reviewed goals, gave copy of eval to pt and established HEP for strengthening in seated position.  Pt able to complete all exercises with therapist facilitation for correct form and technique.  EOS pt reports decreased Rt LE pain,  continues to have high pain scale with lower back and Lt hip.     Rehab Potential Fair   PT Frequency 3x / week   PT Duration 6 weeks   PT Treatment/Interventions Therapeutic  exercise;Therapeutic activities;Functional mobility training;Stair training;Gait training;Balance training;Cognitive remediation;Patient/family education;Moist Heat;Passive range of motion;Dry needling   PT Next Visit Plan Review compliance with HEP, Continue proximal mm strengthening per PT POC   PT Home Exercise Plan 12/05: seated strengthening exercises including posture awareness, scapula retraction, dorsi/plantar flexion, LAQ, ABD with RTB      Patient will benefit from skilled therapeutic intervention in order to improve the following deficits and impairments:  Abnormal gait, Impaired sensation, Improper body mechanics, Pain, Decreased activity tolerance, Difficulty walking, Decreased strength, Obesity  Visit Diagnosis: Pain in right hip  Difficulty in walking, not elsewhere classified  Chronic midline low back pain with left-sided sciatica     Problem List Patient Active Problem List   Diagnosis Date Noted  . HTN (hypertension) 06/05/2016  . Chronic pain syndrome 06/05/2016  . Acute diverticulitis 06/02/2016  . Diverticulitis large intestine 06/02/2016  . Spinal stenosis of lumbar region 02/29/2016  . Joint pain 02/29/2016  . Endometrial polyp 02/06/2014  . Postmenopausal bleeding 01/30/2014  . GERD (gastroesophageal reflux disease) 09/29/2013  . Spinal stenosis, thoracic 09/29/2013  . Shingles 09/29/2013  . Thoracic or lumbosacral neuritis or radiculitis, unspecified 05/04/2011  . Abnormality of gait 05/04/2011  . Muscle weakness (generalized) 05/04/2011  . KNEE, ARTHRITIS, DEGEN./OSTEO 04/07/2009  . KNEE PAIN 04/07/2009   Ihor Austin, [place]; [facility]  Aldona Lento 09/26/2016, 6:35 PM  [place],  [place], [address] Phone: [phone]   Fax:  [phone]  Name: Dixie Coppa MRN: [id number] Date of Birth: Nov 27, 1945

## 2016-09-27 ENCOUNTER — Ambulatory Visit (HOSPITAL_COMMUNITY): Payer: Medicare Other | Admitting: Physical Therapy

## 2016-09-27 ENCOUNTER — Telehealth: Payer: Self-pay | Admitting: Rheumatology

## 2016-09-27 DIAGNOSIS — M25551 Pain in right hip: Secondary | ICD-10-CM

## 2016-09-27 DIAGNOSIS — R262 Difficulty in walking, not elsewhere classified: Secondary | ICD-10-CM | POA: Diagnosis not present

## 2016-09-27 DIAGNOSIS — M5442 Lumbago with sciatica, left side: Secondary | ICD-10-CM

## 2016-09-27 DIAGNOSIS — M5136 Other intervertebral disc degeneration, lumbar region: Secondary | ICD-10-CM | POA: Insufficient documentation

## 2016-09-27 DIAGNOSIS — G8929 Other chronic pain: Secondary | ICD-10-CM

## 2016-09-27 NOTE — Telephone Encounter (Signed)
Patient's daughter Elmo Putt called; would like information about the tests we are running on the patients kidney function. Elmo Putt is on her [place], cb# [phone] or [phone]

## 2016-09-27 NOTE — Progress Notes (Signed)
Office Visit Note  Patient: Nicole [name]             Date of Birth: November 30, 1945           MRN: [id number]             PCP: Purvis Kilts, MD Referring: Sharilyn Sites, MD Visit Date: 09/29/2016 Occupation: @GUAROCC @    Subjective:  No chief complaint on file. Follow-up on PMR, and high-risk prescription  History of Present Illness: Nicole [name] is a 70 y.o. female   Last seen 07/07/2016. Patient states that her PMR is doing fine. No change from the last visit. She is taking her Plaquenil as prescribed at 100 mg in the morning and then 200 mg with the largest meal of the day She is also on prednisone. Were tapering her. She was on 8 mg in September, 7 mg and October, 6 mg in November, 5 mg a December. In January she will start 4 mg every morning. If she cannot tolerate 4 mg in January I will put her back on 5 mg for January.  She was supposed to get a baseline Plaquenil eye exam. We stressed how important it is to get it in the 07/07/2016 visit. Note that patient still has not gotten the testing done. She states that she made an appointment in [place] but when she went to her eye doctor, they said that they did not have the equipment to test her in [place] and she can get it in January 2018 in the [place] office at Dr. Harriett Rush office. As a result she will get her Plaquenil eye exam to 11/09/2016. I called Manuela Schwartz at Dr. Harriett Rush office and confirm this. Manuela Schwartz will try to see if there is an opening to get the patient in sooner. If this happens we would be very pleased.  Currently patient has decreased range of motion of bilateral shoulder joint. She has ongoing hand pain to some MCP joints.  Activities of Daily Living:  Patient reports morning stiffness for 30 minutes.   Patient Reports nocturnal pain.  Difficulty dressing/grooming: Reports Difficulty climbing stairs: Reports Difficulty getting out of chair: Reports Difficulty using hands for taps, buttons, cutlery,  and/or writing: Reports   Review of Systems  Constitutional: Negative for fatigue.  HENT: Negative for mouth sores and mouth dryness.   Eyes: Negative for dryness.  Respiratory: Negative for shortness of breath.   Gastrointestinal: Negative for constipation and diarrhea.  Musculoskeletal: Negative for myalgias and myalgias.  Skin: Negative for sensitivity to sunlight.  Psychiatric/Behavioral: Negative for decreased concentration and sleep disturbance.    PMFS History:  Patient Active Problem List   Diagnosis Date Noted  . PMR (polymyalgia rheumatica) (HCC) 09/27/2016  . DDD (degenerative disc disease), lumbar 09/27/2016  . HTN (hypertension) 06/05/2016  . Chronic pain syndrome 06/05/2016  . Acute diverticulitis 06/02/2016  . Diverticulitis large intestine 06/02/2016  . Spinal stenosis of lumbar region 02/29/2016  . Joint pain 02/29/2016  . Endometrial polyp 02/06/2014  . Postmenopausal bleeding 01/30/2014  . GERD (gastroesophageal reflux disease) 09/29/2013  . Spinal stenosis, thoracic 09/29/2013  . Shingles 09/29/2013  . Thoracic or lumbosacral neuritis or radiculitis, unspecified 05/04/2011  . Abnormality of gait 05/04/2011  . Muscle weakness (generalized) 05/04/2011  . KNEE, ARTHRITIS, DEGEN./OSTEO 04/07/2009  . KNEE PAIN 04/07/2009    Past Medical History:  Diagnosis Date  . AC (acromioclavicular) joint bone spurs    lt shoulder  . Anemia   . Arthritis   . Carpal tunnel  syndrome, bilateral   . Gastroesophageal reflux   . Headache    recent visit to ER @ [facility] for severe headache  . Hypertension   . Lumbar stenosis    Hx of ESIs by Dr. Nelva Bush  . Polyarthralgia   . Polymyalgia ([place])   . Shingles   . Spinal stenosis     Family History  Problem Relation Age of Onset  . Hypertension Mother   . Pneumonia Father    Past Surgical History:  Procedure Laterality Date  . CHOLECYSTECTOMY    . COLONOSCOPY  06/11/2012   Procedure: COLONOSCOPY;  Surgeon: Jamesetta So, MD;  [facility];  Service: Gastroenterology;  Laterality: N/A;  . HYSTEROSCOPY W/D&C N/A 02/25/2014   Procedure: DILATATION AND CURETTAGE /HYSTEROSCOPY;  Surgeon: Florian Buff, MD;  Location: [facility];  Service: Gynecology;  Laterality: N/A;  . POLYPECTOMY N/A 02/25/2014   Procedure: POLYPECTOMY;  Surgeon: Florian Buff, MD;  Location: [facility];  Service: Gynecology;  Laterality: N/A;  . RESECTION DISTAL CLAVICAL Right 03/26/2015   Procedure: OPEN DISTAL CLAVICAL RESECTION ;  Surgeon: Netta Cedars, MD;  Location: [place];  Service: Orthopedics;  Laterality: Right;   Social History   Social History Narrative   Lives at home alone.   Right-handed.   1-2 cups caffeine daily.     Objective: Vital Signs: BP 130/68 (BP Location: Left Arm, Patient Position: Sitting, Cuff Size: Normal)   Pulse 85   Resp 16   Ht 5' (1.524 m)   Wt 264 lb (119.7 kg)   BMI 51.56 kg/m    Physical Exam  Constitutional: She is oriented to person, place, and time. She appears well-developed and well-nourished.  HENT:  Head: Normocephalic and atraumatic.  Eyes: EOM are normal. Pupils are equal, round, and reactive to light.  Cardiovascular: Normal rate, regular rhythm and normal heart sounds.  Exam reveals no gallop and no friction rub.   No murmur heard. Pulmonary/Chest: Effort normal and breath sounds normal. She has no wheezes. She has no rales.  Abdominal: Soft. Bowel sounds are normal. She exhibits no distension. There is no tenderness. There is no guarding. No hernia.  Musculoskeletal: Normal range of motion. She exhibits no edema, tenderness or deformity.  Lymphadenopathy:    She has no cervical adenopathy.  Neurological: She is alert and oriented to person, place, and time. Coordination normal.  Skin: Skin is warm and dry. Capillary refill takes less than 2 seconds. No rash noted.  Psychiatric: She has a normal mood and affect. Her behavior is normal.     Musculoskeletal Exam:  Good  range of motion of most joints except 50 of abduction of bilateral shoulder joint. This is decreased from the last time when it was at least 90 of abduction Grip strength is equal and strong bilaterally Muscle strength to the upper extremity and the lower extremities about 3+ bilaterally. The left side is slightly weaker than the right side in the arms as well as the lower legs. Fiber myalgia tender points are all absent  CDAI Exam: No CDAI exam completed.   No synovitis on examination today  Investigation:  Orders Only on 09/06/2016  Component Date Value Ref Range Status  . Glucose 09/07/2016 67  65 - 99 mg/dL Final  . BUN 09/07/2016 21  8 - 27 mg/dL Final  . Creatinine, Ser 09/07/2016 1.17* 0.57 - 1.00 mg/dL Final  . GFR calc non Af Amer 09/07/2016 47* >59 mL/min/1.73 Final  .  GFR calc Af Amer 09/07/2016 55* >59 mL/min/1.73 Final  . BUN/Creatinine Ratio 09/07/2016 18  12 - 28 Final  . Sodium 09/07/2016 142  134 - 144 mmol/L Final  . Potassium 09/07/2016 4.3  3.5 - 5.2 mmol/L Final  . Chloride 09/07/2016 100  96 - 106 mmol/L Final  . CO2 09/07/2016 27  18 - 29 mmol/L Final  . Calcium 09/07/2016 9.6  8.7 - 10.3 mg/dL Final  . Total Protein 09/07/2016 7.0  6.0 - 8.5 g/dL Final  . Albumin 09/07/2016 4.1  3.5 - 4.8 g/dL Final  . Globulin, Total 09/07/2016 2.9  1.5 - 4.5 g/dL Final  . Albumin/Globulin Ratio 09/07/2016 1.4  1.2 - 2.2 Final  . Bilirubin Total 09/07/2016 0.3  0.0 - 1.2 mg/dL Final  . Alkaline Phosphatase 09/07/2016 119* 39 - 117 IU/L Final  . AST 09/07/2016 21  0 - 40 IU/L Final  . ALT 09/07/2016 21  0 - 32 IU/L Final  . WBC 09/07/2016 10.4  3.4 - 10.8 x10E3/uL Final  . RBC 09/07/2016 4.14  3.77 - 5.28 x10E6/uL Final  . Hemoglobin 09/07/2016 11.9  11.1 - 15.9 g/dL Final  . Hematocrit 09/07/2016 35.8  34.0 - 46.6 % Final  . MCV 09/07/2016 87  79 - 97 fL Final  . MCH 09/07/2016 28.7  26.6 - 33.0 pg Final  . MCHC 09/07/2016 33.2  31.5 - 35.7 g/dL Final  . RDW  09/07/2016 15.2  12.3 - 15.4 % Final  . Platelets 09/07/2016 321  150 - 379 x10E3/uL Final  . Neutrophils 09/07/2016 50  Not Estab. % Final  . Lymphs 09/07/2016 40  Not Estab. % Final  . Monocytes 09/07/2016 7  Not Estab. % Final  . Eos 09/07/2016 3  Not Estab. % Final  . Basos 09/07/2016 0  Not Estab. % Final  . Neutrophils Absolute 09/07/2016 5.1  1.4 - 7.0 x10E3/uL Final  . Lymphocytes Absolute 09/07/2016 4.2* 0.7 - 3.1 x10E3/uL Final  . Monocytes Absolute 09/07/2016 0.8  0.1 - 0.9 x10E3/uL Final  . EOS (ABSOLUTE) 09/07/2016 0.3  0.0 - 0.4 x10E3/uL Final  . Basophils Absolute 09/07/2016 0.0  0.0 - 0.2 x10E3/uL Final  . Immature Granulocytes 09/07/2016 0  Not Estab. % Final  . Immature Grans (Abs) 09/07/2016 0.0  0.0 - 0.1 x10E3/uL Final     Findings:  :  Labs from 06/28/2016 show CBC with diff is normal except for the platelets elevated at 386.  CMP with GFR shows increased creatinine at 1.11 and GFR at 58, but those are close to normal limits and better than the last time.  She does have known chronic kidney disease.  Sed rate is improved.  It is 36 on the last lab.  On 04/20/2016 her sed rate was 5      April 20, 2016 shows CMP with GFR was elevated, creatinine 1.21, decreased GFR 53.  CBC with diff is normal.  Sed rate is elevated at 65.  CCP is elevated at 43.  Hep panel is negative.  TSH is normal.  IgG and IgM are normal.  IgM is elevated at 333.  SPEP is negative.   Her labs from May 2017 showed rheumatoid factor of 46.8.  C-reactive protein was 12.2.  Sed rate was 19.  ANA negative.  CK was 137.   04/20/2016 X-rays of bilateral hands, 2 views show DIP and PIP narrowing.  Left 1st MCP narrowing and bilateral CMC narrowing.  Bilateral knee joint x-rays, 2 views  today, showed bilateral severe varus deformity and bilateral severe patellofemoral narrowing, consistent with severe osteoarthritis and chondromalacia of the patella.      Imaging: No results found.  Speciality  Comments: No specialty comments available.    Procedures:  No procedures performed Allergies: Oxycontin [oxycodone hcl]; Sulfa antibiotics; Tramadol; and Penicillins   Assessment / Plan:     Visit Diagnoses: Muscle weakness (generalized) - Deconditioning history;  PMR (polymyalgia rheumatica) (HCC) - +RF and +CCP ; 09/29/2016: Doing well. No flare. Unchanged from last visit from 3 months ago - Plan: CBC with Differential/Platelet, COMPLETE METABOLIC PANEL WITH GFR, Sedimentation rate  High risk medication use - Plaquenil 100 mg in the morning and 200 mg with the largest meal of the day in the evening; prednisone long long taper ===> will start 4mg  q AM now - Plan: CBC with Differential/Platelet, COMPLETE METABOLIC PANEL WITH GFR, Sedimentation rate  Arthralgia of both lower legs  Abnormality of gait  Essential hypertension  DDD (degenerative disc disease), lumbar   We will get CBC with differential CMP with GFR and sedimentation rate today to monitor her kidney function her bone marrow function as well as her PMR through the sedimentation rate.  The last blood work that she did was at [facility] on 06/28/2016. Her labs were normal for CBC with differential. CMP with GFR was normal except for mild elevation of creatinine at 1.11 (normal is 0.57-1.00). An GFR was 58 (normal is greater than 59). Although the kidney function is slightly out of range it is fairly good and we will monitor the patient. Her sedimentation rate was 36 (0-4 0) is normal.  I will refill the Plaquenil. Unfortunately patient has had a delay in getting her Plaquenil eye exam. Although we started the medication in about July 2017 and repeatedly asked her to get a Plaquenil baseline eye exam for 1 recent another there was a delay.  I personally called Dr. Kellie Moor office in [place] and spoke with Manuela Schwartz. She has an appointment 11/09/2016 for Plaquenil eye exam. Manuela Schwartz said that if there is an earlier appointment  they will work her and I will call our office as well as the patient to notify them. Her cell number is [phone] Her home number is [phone].  Overall the patient is doing well with her PMR. As a result she is tapered from her prednisone gradually and she is currently on 5 mg daily. It is okay for [place] to move her down to 4 mg and I have written her prescription for 1 mg prednisone to accomplish this. She'll start the 4 mg dosage in January 2018. I've given the patient permission to go back up to 5 mg after notifying our office should she not do well with the 4 mg.  Return to clinic in 3 months  Upper and lower extremity strength are about 3+ bilaterally with left side slightly weaker than the right side  Orders: Orders Placed This Encounter  Procedures  . CBC with Differential/Platelet  . COMPLETE METABOLIC PANEL WITH GFR  . Sedimentation rate   Meds ordered this encounter  Medications  . predniSONE (DELTASONE) 1 MG tablet    Sig: Take 4 tablets (4 mg total) by mouth daily with breakfast.    Dispense:  120 tablet    Refill:  2    Order Specific Question:   Supervising Provider    Answer:   Lyda Perone  . hydroxychloroquine (PLAQUENIL) 200 MG  tablet    Sig: Taking 0.5 tab in am and 1 tab in pm.    Dispense:  135 tablet    Refill:  1    Order Specific Question:   Supervising Provider    Answer:   Lyda Perone    Face-to-face time spent with patient was 30 minutes. 50% of time was spent in counseling and coordination of care.  Follow-Up Instructions: Return in about 3 months (around 12/28/2016), or pmr, + RF, PLQ, PREDNISONE 4mg  qAM; .   Legacie Dillingham, PA-C I examined and evaluated the patient with Eliezer Lofts PA. The plan of care was discussed as noted above.  Bo Merino, MD   CMP     Component Value Date/Time   NA 143 09/29/2016 1004   NA 142 09/06/2016 1141   K 4.2 09/29/2016 1004   CL 105 09/29/2016 1004   CO2 27  09/29/2016 1004   GLUCOSE 90 09/29/2016 1004   BUN 22 09/29/2016 1004   BUN 21 09/06/2016 1141   CREATININE 1.36 (H) 09/29/2016 1004   CALCIUM 9.1 09/29/2016 1004   PROT 6.5 09/29/2016 1004   PROT 7.0 09/06/2016 1141   ALBUMIN 3.5 (L) 09/29/2016 1004   ALBUMIN 4.1 09/06/2016 1141   AST 14 09/29/2016 1004   ALT 15 09/29/2016 1004   ALKPHOS 87 09/29/2016 1004   BILITOT 0.4 09/29/2016 1004   BILITOT 0.3 09/06/2016 1141   GFRNONAA 39 (L) 09/29/2016 1004   GFRAA 45 (L) 09/29/2016 1004

## 2016-09-27 NOTE — Therapy (Signed)
[place], [place], [address] Phone: [phone]   Fax:  [phone]  Physical Therapy Treatment  Patient Details  Name: Nicole [name] MRN: [id number] Date of Birth: Mar 01, 1946 Referring Provider: Bo Merino   Encounter Date: 09/27/2016      PT End of Session - 09/27/16 1057    Visit Number 3   Number of Visits 18   Date for PT Re-Evaluation 10/13/16   Authorization Type Medicare    Authorization Time Period 09/20/16- 11/01/16   Authorization - Visit Number 3   Authorization - Number of Visits 10   PT Start Time 0950   PT Stop Time 1030   PT Time Calculation (min) 40 min   Activity Tolerance Patient tolerated treatment well   Behavior During Therapy [facility] for tasks assessed/performed      Past Medical History:  Diagnosis Date  . AC (acromioclavicular) joint bone spurs    lt shoulder  . Anemia   . Arthritis   . Carpal tunnel syndrome, bilateral   . Gastroesophageal reflux   . Headache    recent visit to ER @ [facility] for severe headache  . Hypertension   . Lumbar stenosis    Hx of ESIs by Dr. Nelva Bush  . Polyarthralgia   . Polymyalgia ([place])   . Shingles   . Spinal stenosis     Past Surgical History:  Procedure Laterality Date  . CHOLECYSTECTOMY    . COLONOSCOPY  06/11/2012   Procedure: COLONOSCOPY;  Surgeon: Jamesetta So, MD;  [facility];  Service: Gastroenterology;  Laterality: N/A;  . HYSTEROSCOPY W/D&C N/A 02/25/2014   Procedure: DILATATION AND CURETTAGE /HYSTEROSCOPY;  Surgeon: Florian Buff, MD;  Location: [facility];  Service: Gynecology;  Laterality: N/A;  . POLYPECTOMY N/A 02/25/2014   Procedure: POLYPECTOMY;  Surgeon: Florian Buff, MD;  Location: [facility];  Service: Gynecology;  Laterality: N/A;  . RESECTION DISTAL CLAVICAL Right 03/26/2015   Procedure: OPEN DISTAL CLAVICAL RESECTION ;  Surgeon: Netta Cedars, MD;  Location: [place];  Service: Orthopedics;  Laterality: Right;     There were no vitals filed for this visit.      Subjective Assessment - 09/27/16 0954    Subjective patient arrives today very pleasant but very SOB, had to stop multiple times walking to treatment room. She reports that she gets SOB from time to time. Her pain is about 8/10 right now, it was more settled earlier today.    Pertinent History Pt had PT at this location for a related problem in 2012, with good resolution inititally, but January 2017 came back and gradually getting worse. Spinal stenosis diagnosed. Pt reports her weight has been stable for most of her adult years. She has needed a RW since ~March due to progressive weakness, previosuly SPC intermittently for years. Foot drop reported to become more noticeable this year also.    Patient Stated Goals Return to walking and being more independent with ADL, meals. About 2 years, pt had no limitations walking for prolonged.    Currently in Pain? Yes   Pain Score 8    Pain Location Other (Comment)  B hips and central low back                          [facility] Adult PT Treatment/Exercise - 09/27/16 0001      Lumbar Exercises: Seated   Other Seated Lumbar Exercises scapular retraction  1x15, backwards shoulder rolls 1x15, combo glut/ab squeezes with 3 second holds 1x15  repeated lumbar flexion reduced pain in sitting      Lumbar Exercises: Supine   Ab Set 15 reps   AB Set Limitations 3 second holds    Glut Set 15 reps   Glut Set Limitations 3 second holds    Clam 10 reps   Bridge 10 reps                PT Education - 09/27/16 1056    Education provided Yes   Education Details possible DOMS    Person(s) Educated Patient   Methods Explanation   Comprehension Verbalized understanding          PT Short Term Goals - 09/20/16 2118      PT SHORT TERM GOAL #1   Title After 3 weeks patient will demonstrate improved strength AEB 5xSTS s AD in <30 seconds.    Status New     PT SHORT TERM GOAL #2    Title After 3 weeks pt will demonstrate improved tolerance to AMB able to sustain 6MWT at speed >0.72m/s.    Status New     PT SHORT TERM GOAL #3   Title After 3 weeks pt will report three ways to manage symptoms at home and demonstrate independence in a starter HEP established by PT.    Status New           PT Long Term Goals - 09/20/16 2123      PT LONG TERM GOAL #1   Title After 6 weeks patient will demonstrate improved strength AEB 5xSTS s AD in <20 seconds.    Status New     PT LONG TERM GOAL #2   Title After 6 weeks pt will demonstrate improved tolerance to AMB able to sustain 6MWT at speed >0.72m/s.   Status New     PT LONG TERM GOAL #3   Title After 6 weeks pt will demonstrate independence in an advanced HEP established by PT.    Status New     PT LONG TERM GOAL #4   Title After 6 weeks pt will demonstrate improved strength in BLE AEB 4/5 strength in all BLE groups tested at evaluation.    Status New               Plan - 09/27/16 1057    Clinical Impression Statement Patient arrives today fatigued and SOB today, requiring seated rest break to recover from SOB; pulse ox O2 99% RA, HR 84. Proceeded with proximal functional strengthening today with cues for form and breathing pattern, noting severe functional weakness throughout all postural and proximal musculature. Patient appears quite surprised by the extent of her functional weakness this session; rest breaks provided PRN throughout session. She continues to touch and gently tapping her L LE throughout session, stating "its numb, the exercises made it tingle more". Trialed repeated lumbar flexion to reduce N/T symptoms with successful reduction of intensity/extent of tingling sensations. Noted increased cool temperature L foot/LE in general, possible longer time for capillary refill L foot, mild edema B LEs. Difficulty noted in coordination combo core/glut squeezes in sitting.    Rehab Potential Fair   PT Frequency  3x / week   PT Duration 6 weeks   PT Treatment/Interventions Therapeutic exercise;Therapeutic activities;Functional mobility training;Stair training;Gait training;Balance training;Cognitive remediation;Patient/family education;Moist Heat;Passive range of motion;Dry needling   PT Next Visit Plan continue proximal and postural strength, repeated lumbar flexions as this  reduced pain    PT Home Exercise Plan 12/05: seated strengthening exercises including posture awareness, scapula retraction, dorsi/plantar flexion, LAQ, ABD with RTB   Consulted and Agree with Plan of Care Patient      Patient will benefit from skilled therapeutic intervention in order to improve the following deficits and impairments:  Abnormal gait, Impaired sensation, Improper body mechanics, Pain, Decreased activity tolerance, Difficulty walking, Decreased strength, Obesity  Visit Diagnosis: Pain in right hip  Difficulty in walking, not elsewhere classified  Chronic midline low back pain with left-sided sciatica     Problem List Patient Active Problem List   Diagnosis Date Noted  . HTN (hypertension) 06/05/2016  . Chronic pain syndrome 06/05/2016  . Acute diverticulitis 06/02/2016  . Diverticulitis large intestine 06/02/2016  . Spinal stenosis of lumbar region 02/29/2016  . Joint pain 02/29/2016  . Endometrial polyp 02/06/2014  . Postmenopausal bleeding 01/30/2014  . GERD (gastroesophageal reflux disease) 09/29/2013  . Spinal stenosis, thoracic 09/29/2013  . Shingles 09/29/2013  . Thoracic or lumbosacral neuritis or radiculitis, unspecified 05/04/2011  . Abnormality of gait 05/04/2011  . Muscle weakness (generalized) 05/04/2011  . KNEE, ARTHRITIS, DEGEN./OSTEO 04/07/2009  . KNEE PAIN 04/07/2009    Deniece Ree PT, DPT [place] [address] [place], [place], [address] Phone: [phone]   Fax:  [phone]  Name: Nicole [name] MRN:  [id number] Date of Birth: April 11, 1946

## 2016-09-28 NOTE — Telephone Encounter (Signed)
Reviewed lab results with Elmo Putt and she verbalized understanding.

## 2016-09-29 ENCOUNTER — Ambulatory Visit (INDEPENDENT_AMBULATORY_CARE_PROVIDER_SITE_OTHER): Payer: Medicare Other | Admitting: Rheumatology

## 2016-09-29 ENCOUNTER — Telehealth (HOSPITAL_COMMUNITY): Payer: Self-pay

## 2016-09-29 ENCOUNTER — Encounter: Payer: Self-pay | Admitting: Rheumatology

## 2016-09-29 ENCOUNTER — Ambulatory Visit (HOSPITAL_COMMUNITY): Payer: Medicare Other

## 2016-09-29 VITALS — BP 130/68 | HR 85 | Resp 16 | Ht 60.0 in | Wt 264.0 lb

## 2016-09-29 DIAGNOSIS — M25561 Pain in right knee: Secondary | ICD-10-CM | POA: Diagnosis not present

## 2016-09-29 DIAGNOSIS — M353 Polymyalgia rheumatica: Secondary | ICD-10-CM | POA: Diagnosis not present

## 2016-09-29 DIAGNOSIS — M5136 Other intervertebral disc degeneration, lumbar region: Secondary | ICD-10-CM

## 2016-09-29 DIAGNOSIS — R269 Unspecified abnormalities of gait and mobility: Secondary | ICD-10-CM | POA: Diagnosis not present

## 2016-09-29 DIAGNOSIS — M6281 Muscle weakness (generalized): Secondary | ICD-10-CM

## 2016-09-29 DIAGNOSIS — M25562 Pain in left knee: Secondary | ICD-10-CM | POA: Diagnosis not present

## 2016-09-29 DIAGNOSIS — Z79899 Other long term (current) drug therapy: Secondary | ICD-10-CM | POA: Diagnosis not present

## 2016-09-29 DIAGNOSIS — I10 Essential (primary) hypertension: Secondary | ICD-10-CM

## 2016-09-29 DIAGNOSIS — M51369 Other intervertebral disc degeneration, lumbar region without mention of lumbar back pain or lower extremity pain: Secondary | ICD-10-CM

## 2016-09-29 LAB — CBC WITH DIFFERENTIAL/PLATELET
Basophils Absolute: 0 cells/uL (ref 0–200)
Basophils Relative: 0 %
Eosinophils Absolute: 288 cells/uL (ref 15–500)
Eosinophils Relative: 4 %
HCT: 35.4 % (ref 35.0–45.0)
Hemoglobin: 11.6 g/dL — ABNORMAL LOW (ref 11.7–15.5)
Lymphocytes Relative: 35 %
Lymphs Abs: 2520 cells/uL (ref 850–3900)
MCH: 28.4 pg (ref 27.0–33.0)
MCHC: 32.8 g/dL (ref 32.0–36.0)
MCV: 86.8 fL (ref 80.0–100.0)
MPV: 8.9 fL (ref 7.5–12.5)
Monocytes Absolute: 576 cells/uL (ref 200–950)
Monocytes Relative: 8 %
Neutro Abs: 3816 cells/uL (ref 1500–7800)
Neutrophils Relative %: 53 %
Platelets: 301 10*3/uL (ref 140–400)
RBC: 4.08 MIL/uL (ref 3.80–5.10)
RDW: 15.3 % — ABNORMAL HIGH (ref 11.0–15.0)
WBC: 7.2 10*3/uL (ref 3.8–10.8)

## 2016-09-29 MED ORDER — HYDROXYCHLOROQUINE SULFATE 200 MG PO TABS: ORAL_TABLET | ORAL | 1 refills | Status: DC

## 2016-09-29 MED ORDER — PREDNISONE 1 MG PO TABS: 4.0000 mg | ORAL_TABLET | Freq: Every day | ORAL | 2 refills | Status: DC

## 2016-09-29 NOTE — Telephone Encounter (Signed)
School was dismissed early and pt left without being seen today.

## 2016-09-30 LAB — COMPLETE METABOLIC PANEL WITH GFR
ALT: 15 U/L (ref 6–29)
AST: 14 U/L (ref 10–35)
Albumin: 3.5 g/dL — ABNORMAL LOW (ref 3.6–5.1)
Alkaline Phosphatase: 87 U/L (ref 33–130)
BUN: 22 mg/dL (ref 7–25)
CO2: 27 mmol/L (ref 20–31)
Calcium: 9.1 mg/dL (ref 8.6–10.4)
Chloride: 105 mmol/L (ref 98–110)
Creat: 1.36 mg/dL — ABNORMAL HIGH (ref 0.60–0.93)
GFR, Est African American: 45 mL/min — ABNORMAL LOW (ref >=60)
GFR, Est Non African American: 39 mL/min — ABNORMAL LOW (ref >=60)
Glucose, Bld: 90 mg/dL (ref 65–99)
Potassium: 4.2 mmol/L (ref 3.5–5.3)
Sodium: 143 mmol/L (ref 135–146)
Total Bilirubin: 0.4 mg/dL (ref 0.2–1.2)
Total Protein: 6.5 g/dL (ref 6.1–8.1)

## 2016-09-30 LAB — SEDIMENTATION RATE: Sed Rate: 53 mm/hr — ABNORMAL HIGH (ref 0–30)

## 2016-10-02 NOTE — Progress Notes (Signed)
Labs are consistent with PMR, abnormal kidney function.No change in treatment.

## 2016-10-03 ENCOUNTER — Ambulatory Visit (HOSPITAL_COMMUNITY): Payer: Medicare Other | Admitting: Physical Therapy

## 2016-10-03 DIAGNOSIS — M5442 Lumbago with sciatica, left side: Secondary | ICD-10-CM | POA: Diagnosis not present

## 2016-10-03 DIAGNOSIS — R262 Difficulty in walking, not elsewhere classified: Secondary | ICD-10-CM

## 2016-10-03 DIAGNOSIS — M25551 Pain in right hip: Secondary | ICD-10-CM

## 2016-10-03 DIAGNOSIS — G8929 Other chronic pain: Secondary | ICD-10-CM | POA: Diagnosis not present

## 2016-10-03 NOTE — Therapy (Signed)
[place], [place], [address] Phone: [phone]   Fax:  [phone]  Physical Therapy Treatment  Patient Details  Name: Nicole [name] MRN: [id number] Date of Birth: Jul 24, 1946 Referring Provider: Bo Merino   Encounter Date: 10/03/2016      PT End of Session - 10/03/16 1205    Visit Number 4   Number of Visits 18   Date for PT Re-Evaluation 10/13/16   Authorization Type Medicare    Authorization Time Period 09/20/16- 11/01/16   Authorization - Visit Number 4   Authorization - Number of Visits 10   PT Start Time 1030   PT Stop Time 1110   PT Time Calculation (min) 40 min   Activity Tolerance Patient limited by fatigue;Patient tolerated treatment well;Patient limited by pain   Behavior During Therapy [facility] for tasks assessed/performed      Past Medical History:  Diagnosis Date  . AC (acromioclavicular) joint bone spurs    lt shoulder  . Anemia   . Arthritis   . Carpal tunnel syndrome, bilateral   . Gastroesophageal reflux   . Headache    recent visit to ER @ [facility] for severe headache  . Hypertension   . Lumbar stenosis    Hx of ESIs by Dr. Nelva Bush  . Polyarthralgia   . Polymyalgia ([place])   . Shingles   . Spinal stenosis     Past Surgical History:  Procedure Laterality Date  . CHOLECYSTECTOMY    . COLONOSCOPY  06/11/2012   Procedure: COLONOSCOPY;  Surgeon: Jamesetta So, MD;  [facility];  Service: Gastroenterology;  Laterality: N/A;  . HYSTEROSCOPY W/D&C N/A 02/25/2014   Procedure: DILATATION AND CURETTAGE /HYSTEROSCOPY;  Surgeon: Florian Buff, MD;  [facility];  Service: Gynecology;  Laterality: N/A;  . POLYPECTOMY N/A 02/25/2014   Procedure: POLYPECTOMY;  Surgeon: Florian Buff, MD;  Location[facility];  Service: Gynecology;  Laterality: N/A;  . RESECTION DISTAL CLAVICAL Right 03/26/2015   Procedure: OPEN DISTAL CLAVICAL RESECTION ;  Surgeon: Netta Cedars, MD;  Location: [place];   Service: Orthopedics;  Laterality: Right;    There were no vitals filed for this visit.      Subjective Assessment - 10/03/16 1034    Subjective Patient arrives today stating that she is feeling stiff and having quite a bit of pain today; 2 rest breaks wakling to treatment room. Her pain is 8/10.    Pertinent History Pt had PT at this location for a related problem in 2012, with good resolution inititally, but January 2017 came back and gradually getting worse. Spinal stenosis diagnosed. Pt reports her weight has been stable for most of her adult years. She has needed a RW since ~March due to progressive weakness, previosuly SPC intermittently for years. Foot drop reported to become more noticeable this year also.    Patient Stated Goals Return to walking and being more independent with ADL, meals. About 2 years, pt had no limitations walking for prolonged.    Currently in Pain? Yes   Pain Score 8    Pain Location Other (Comment)  B hips and low back    Pain Orientation Left   Pain Descriptors / Indicators Stabbing;Shooting   Pain Type Chronic pain   Pain Radiating Towards down B LEs    Pain Onset More than a month ago   Pain Frequency Constant   Aggravating Factors  sitting and standing    Pain Relieving  Factors being still, sit or lay for a long time    Effect of Pain on Daily Activities decreased ADLs                          [facility] Adult PT Treatment/Exercise - 10/03/16 0001      Lumbar Exercises: Stretches   Single Knee to Chest Stretch 3 reps   Single Knee to Chest Stretch Limitations 5 seconds    Lower Trunk Rotation 5 reps   Lower Trunk Rotation Limitations 3 seconds    Piriformis Stretch 2 reps;30 seconds   Piriformis Stretch Limitations supine      Lumbar Exercises: Supine   Ab Set 15 reps   AB Set Limitations 3 second holds    Glut Set 15 reps   Glut Set Limitations 3 second holds   unable to perform more than 5 due to pain    Bent Knee Raise 10  reps   Bent Knee Raise Limitations core set    Bridge 10 reps   Other Supine Lumbar Exercises lumbar flattening on mat talbe with 3 seconds holds 1x15                 PT Education - 10/03/16 1205    Education provided No          PT Short Term Goals - 09/20/16 2118      PT SHORT TERM GOAL #1   Title After 3 weeks patient will demonstrate improved strength AEB 5xSTS s AD in <30 seconds.    Status New     PT SHORT TERM GOAL #2   Title After 3 weeks pt will demonstrate improved tolerance to AMB able to sustain 6MWT at speed >0.56m/s.    Status New     PT SHORT TERM GOAL #3   Title After 3 weeks pt will report three ways to manage symptoms at home and demonstrate independence in a starter HEP established by PT.    Status New           PT Long Term Goals - 09/20/16 2123      PT LONG TERM GOAL #1   Title After 6 weeks patient will demonstrate improved strength AEB 5xSTS s AD in <20 seconds.    Status New     PT LONG TERM GOAL #2   Title After 6 weeks pt will demonstrate improved tolerance to AMB able to sustain 6MWT at speed >0.102m/s.   Status New     PT LONG TERM GOAL #3   Title After 6 weeks pt will demonstrate independence in an advanced HEP established by PT.    Status New     PT LONG TERM GOAL #4   Title After 6 weeks pt will demonstrate improved strength in BLE AEB 4/5 strength in all BLE groups tested at evaluation.    Status New               Plan - 10/03/16 1205    Clinical Impression Statement Continued with functional strengthening and lumbar mobility today, with patient having difficulty with gluteal and extension based exercises this session secondary to radiating pain. Flexion based activities such as a SKTC, lumbar rotations, repeated seated lumbar flexion, etc appear to continue to reduce pain. Continued to work on postural training in sitting this session as well. Unable to tolerate manual traction due to knee pain. Pain reduced to  4-5/10 at EOS, patient limited by fatigue this session.  Rehab Potential Fair   PT Frequency 3x / week   PT Duration 6 weeks   PT Treatment/Interventions Therapeutic exercise;Therapeutic activities;Functional mobility training;Stair training;Gait training;Balance training;Cognitive remediation;Patient/family education;Moist Heat;Passive range of motion;Dry needling   PT Next Visit Plan continue proximal and postural strength, repeated lumbar flexions as this is consistently reducing pain    PT Home Exercise Plan 12/05: seated strengthening exercises including posture awareness, scapula retraction, dorsi/plantar flexion, LAQ, ABD with RTB   Consulted and Agree with Plan of Care Patient      Patient will benefit from skilled therapeutic intervention in order to improve the following deficits and impairments:  Abnormal gait, Impaired sensation, Improper body mechanics, Pain, Decreased activity tolerance, Difficulty walking, Decreased strength, Obesity  Visit Diagnosis: Pain in right hip  Difficulty in walking, not elsewhere classified  Chronic midline low back pain with left-sided sciatica     Problem List Patient Active Problem List   Diagnosis Date Noted  . PMR (polymyalgia rheumatica) (HCC) 09/27/2016  . DDD (degenerative disc disease), lumbar 09/27/2016  . HTN (hypertension) 06/05/2016  . Chronic pain syndrome 06/05/2016  . Acute diverticulitis 06/02/2016  . Diverticulitis large intestine 06/02/2016  . Spinal stenosis of lumbar region 02/29/2016  . Joint pain 02/29/2016  . Endometrial polyp 02/06/2014  . Postmenopausal bleeding 01/30/2014  . GERD (gastroesophageal reflux disease) 09/29/2013  . Spinal stenosis, thoracic 09/29/2013  . Shingles 09/29/2013  . Thoracic or lumbosacral neuritis or radiculitis, unspecified 05/04/2011  . Abnormality of gait 05/04/2011  . Muscle weakness (generalized) 05/04/2011  . KNEE, ARTHRITIS, DEGEN./OSTEO 04/07/2009  . KNEE PAIN 04/07/2009     Deniece Ree PT, DPT [place] [address] [place], [place], [address] Phone: [phone]   Fax:  [phone]  Name: Norissa Bartee MRN: [id number] Date of Birth: 1945/12/09

## 2016-10-04 ENCOUNTER — Telehealth: Payer: Self-pay | Admitting: Pharmacist

## 2016-10-04 ENCOUNTER — Ambulatory Visit (HOSPITAL_COMMUNITY): Payer: Medicare Other | Admitting: Physical Therapy

## 2016-10-04 DIAGNOSIS — R262 Difficulty in walking, not elsewhere classified: Secondary | ICD-10-CM | POA: Diagnosis not present

## 2016-10-04 DIAGNOSIS — M25551 Pain in right hip: Secondary | ICD-10-CM | POA: Diagnosis not present

## 2016-10-04 DIAGNOSIS — M5442 Lumbago with sciatica, left side: Secondary | ICD-10-CM

## 2016-10-04 DIAGNOSIS — G8929 Other chronic pain: Secondary | ICD-10-CM

## 2016-10-04 NOTE — Telephone Encounter (Signed)
Spoke to [employer] at [phone] (Reference #: [id number]).  They are going to see if they can get a non-film coated tablet that can be split.  They will research it and call us back if they cannot fill the prescription.    I called patient to update her.  If they cannot fill the prescription with non-film coated tablets patient would prefer to switch the prescription back to [facility].

## 2016-10-04 NOTE — Therapy (Signed)
[place], [place], [address] Phone: [phone]   Fax:  [phone]  Physical Therapy Treatment  Patient Details  Name: Nicole [name] MRN: [id number] Date of Birth: 12-28-1945 Referring Provider: Bo Merino   Encounter Date: 10/04/2016      PT End of Session - 10/04/16 1032    Visit Number 5   Number of Visits 18   Date for PT Re-Evaluation 10/13/16   Authorization Type Medicare    Authorization Time Period 09/20/16- 11/01/16   Authorization - Visit Number 5   Authorization - Number of Visits 10   PT Start Time 0945   PT Stop Time 1030   PT Time Calculation (min) 45 min   Activity Tolerance Patient tolerated treatment well   Behavior During Therapy [facility] for tasks assessed/performed      Past Medical History:  Diagnosis Date  . AC (acromioclavicular) joint bone spurs    lt shoulder  . Anemia   . Arthritis   . Carpal tunnel syndrome, bilateral   . Gastroesophageal reflux   . Headache    recent visit to ER @ [facility] for severe headache  . Hypertension   . Lumbar stenosis    Hx of ESIs by Dr. Nelva Bush  . Polyarthralgia   . Polymyalgia ([place])   . Shingles   . Spinal stenosis     Past Surgical History:  Procedure Laterality Date  . CHOLECYSTECTOMY    . COLONOSCOPY  06/11/2012   Procedure: COLONOSCOPY;  Surgeon: Jamesetta So, MD;  [facility];  Service: Gastroenterology;  Laterality: N/A;  . HYSTEROSCOPY W/D&C N/A 02/25/2014   Procedure: DILATATION AND CURETTAGE /HYSTEROSCOPY;  Surgeon: Florian Buff, MD;  [facility];  Service: Gynecology;  Laterality: N/A;  . POLYPECTOMY N/A 02/25/2014   Procedure: POLYPECTOMY;  Surgeon: Florian Buff, MD;  Location[facility];  Service: Gynecology;  Laterality: N/A;  . RESECTION DISTAL CLAVICAL Right 03/26/2015   Procedure: OPEN DISTAL CLAVICAL RESECTION ;  Surgeon: Netta Cedars, MD;  Location: [place];  Service: Orthopedics;  Laterality: Right;     There were no vitals filed for this visit.      Subjective Assessment - 10/04/16 0951    Subjective Patient arrives today stating she is feeling much better than she did the other day, she is moving much easier, only required 1 very short rest break on way back to treatment room. Her L back and hip feel a little locked up but she still feels better.    Pertinent History Pt had PT at this location for a related problem in 2012, with good resolution inititally, but January 2017 came back and gradually getting worse. Spinal stenosis diagnosed. Pt reports her weight has been stable for most of her adult years. She has needed a RW since ~March due to progressive weakness, previosuly SPC intermittently for years. Foot drop reported to become more noticeable this year also.    Patient Stated Goals Return to walking and being more independent with ADL, meals. About 2 years, pt had no limitations walking for prolonged.    Currently in Pain? Yes   Pain Score 4    Pain Location Other (Comment)  mostly back and L hip                          [facility] Adult PT Treatment/Exercise - 10/04/16 0001      Lumbar Exercises: Stretches  Passive Hamstring Stretch 2 reps;30 seconds   Passive Hamstring Stretch Limitations supine    Single Knee to Chest Stretch 3 reps   Single Knee to Chest Stretch Limitations 5 seconds    Lower Trunk Rotation 5 reps   Lower Trunk Rotation Limitations 3 seconds    Piriformis Stretch 3 reps;30 seconds   Piriformis Stretch Limitations supine      Lumbar Exercises: Supine   Ab Set 20 reps   AB Set Limitations 3 second holds    Bent Knee Raise 10 reps   Bent Knee Raise Limitations core set    Other Supine Lumbar Exercises lumbar flattening on mat talbe with 3 seconds holds 1x15                 PT Education - 10/04/16 1032    Education provided Yes   Education Details HEP updates, progress with PT (global)   Person(s) Educated Patient   Methods  Explanation;Demonstration;Handout   Comprehension Verbalized understanding;Returned demonstration          PT Short Term Goals - 09/20/16 2118      PT SHORT TERM GOAL #1   Title After 3 weeks patient will demonstrate improved strength AEB 5xSTS s AD in <30 seconds.    Status New     PT SHORT TERM GOAL #2   Title After 3 weeks pt will demonstrate improved tolerance to AMB able to sustain 6MWT at speed >0.95m/s.    Status New     PT SHORT TERM GOAL #3   Title After 3 weeks pt will report three ways to manage symptoms at home and demonstrate independence in a starter HEP established by PT.    Status New           PT Long Term Goals - 09/20/16 2123      PT LONG TERM GOAL #1   Title After 6 weeks patient will demonstrate improved strength AEB 5xSTS s AD in <20 seconds.    Status New     PT LONG TERM GOAL #2   Title After 6 weeks pt will demonstrate improved tolerance to AMB able to sustain 6MWT at speed >0.40m/s.   Status New     PT LONG TERM GOAL #3   Title After 6 weeks pt will demonstrate independence in an advanced HEP established by PT.    Status New     PT LONG TERM GOAL #4   Title After 6 weeks pt will demonstrate improved strength in BLE AEB 4/5 strength in all BLE groups tested at evaluation.    Status New               Plan - 10/04/16 1032    Clinical Impression Statement Patient arrives reporting she is feeling much better today with pain approximately 4/10; DPT notes that her mobility is much improved with increased gait speed and needing only one rest break to get to treatment room today. Due to success of previous session, continued with functional stretching and lumbar mobility as well as flexion based exercises this session to continue working on pain reduction/improved function. Patient doing very well this session and appears to be progressing towards goals at this point. Trialed therband ankle DF with success in improvement of gait pattern this  session as well.   pain 2/10 at EOS    Rehab Potential Good   PT Frequency 3x / week   PT Duration 6 weeks   PT Treatment/Interventions Therapeutic exercise;Therapeutic activities;Functional mobility training;Stair  training;Gait training;Balance training;Cognitive remediation;Patient/family education;Moist Heat;Passive range of motion;Dry needling   PT Next Visit Plan continue functional stretching, lumbar mobility, core activation. Continue trial of ankle therband DF assist.    PT Home Exercise Plan 12/05: seated strengthening exercises including posture awareness, scapula retraction, dorsi/plantar flexion, LAQ, ABD with RTB. 12/13- SKTC, lumbar rotation    Consulted and Agree with Plan of Care Patient      Patient will benefit from skilled therapeutic intervention in order to improve the following deficits and impairments:  Abnormal gait, Impaired sensation, Improper body mechanics, Pain, Decreased activity tolerance, Difficulty walking, Decreased strength, Obesity  Visit Diagnosis: Pain in right hip  Difficulty in walking, not elsewhere classified  Chronic midline low back pain with left-sided sciatica     Problem List Patient Active Problem List   Diagnosis Date Noted  . PMR (polymyalgia rheumatica) (HCC) 09/27/2016  . DDD (degenerative disc disease), lumbar 09/27/2016  . HTN (hypertension) 06/05/2016  . Chronic pain syndrome 06/05/2016  . Acute diverticulitis 06/02/2016  . Diverticulitis large intestine 06/02/2016  . Spinal stenosis of lumbar region 02/29/2016  . Joint pain 02/29/2016  . Endometrial polyp 02/06/2014  . Postmenopausal bleeding 01/30/2014  . GERD (gastroesophageal reflux disease) 09/29/2013  . Spinal stenosis, thoracic 09/29/2013  . Shingles 09/29/2013  . Thoracic or lumbosacral neuritis or radiculitis, unspecified 05/04/2011  . Abnormality of gait 05/04/2011  . Muscle weakness (generalized) 05/04/2011  . KNEE, ARTHRITIS, DEGEN./OSTEO 04/07/2009  . KNEE  PAIN 04/07/2009    Deniece Ree PT, DPT [place] [address] [place], [place], [address] Phone: [phone]   Fax:  [phone]  Name: Nicole [name] MRN: [id number] Date of Birth: 1945-11-20

## 2016-10-04 NOTE — Telephone Encounter (Signed)
Received a call from [facility] regarding patient's hydroxychloroquine prescription.  Instructions are for patient to take 100 mg in the morning and 200 mg in the evening.  The pharmacy reports the tablets they have are film coated and cannot be cut.    Please advised.  Do you want to see if patient is agreeable to change dose to 200 mg twice daily Monday through Friday?

## 2016-10-04 NOTE — Patient Instructions (Signed)
   SINGLE KNEE TO CHEST STRETCH - [place]  While Lying on your back, hold your knee and gently pull it up towards your chest. You may loop a sheet behind your knee as we do at PT to help with this.  Hold for 3 seconds, repeat 5 times each side, twice a day.    Lumbar Rotations   Lying on your back with your knees bent, slowly drop your legs to one side and hold the stretch. Come back to the middle and switch sides. You should feel the stretch in your back on the opposite side that your legs are leaning.  Hold for 3 seconds, repeat 5 times each side, twice a day.

## 2016-10-04 NOTE — Telephone Encounter (Signed)
She has GI upset with the 200 mg dose and as a result her largest meal of the day is in the evening and she takes to 200 mg in the eveningI offered her 2 mg twice a day Monday through Friday but she does not tolerate that dosing wellIf they can give her non-film coated Plaquenil, that would work out well.

## 2016-10-05 ENCOUNTER — Telehealth: Payer: Self-pay | Admitting: *Deleted

## 2016-10-05 ENCOUNTER — Other Ambulatory Visit: Payer: Self-pay | Admitting: *Deleted

## 2016-10-05 MED ORDER — HYDROXYCHLOROQUINE SULFATE 200 MG PO TABS: ORAL_TABLET | ORAL | 1 refills | Status: DC

## 2016-10-05 NOTE — Telephone Encounter (Signed)
Patient advised [facility] is going to be unable to get the PLQ without the film coating. Prescription sent to [facility] per patient request.

## 2016-10-06 ENCOUNTER — Ambulatory Visit (HOSPITAL_COMMUNITY): Payer: Medicare Other

## 2016-10-06 ENCOUNTER — Ambulatory Visit: Payer: Medicare Other | Admitting: Rheumatology

## 2016-10-06 DIAGNOSIS — G8929 Other chronic pain: Secondary | ICD-10-CM

## 2016-10-06 DIAGNOSIS — M25551 Pain in right hip: Secondary | ICD-10-CM | POA: Diagnosis not present

## 2016-10-06 DIAGNOSIS — M5442 Lumbago with sciatica, left side: Secondary | ICD-10-CM

## 2016-10-06 DIAGNOSIS — R262 Difficulty in walking, not elsewhere classified: Secondary | ICD-10-CM | POA: Diagnosis not present

## 2016-10-06 NOTE — Therapy (Signed)
[place], [place], [address] Phone: [phone]   Fax:  [phone]  Physical Therapy Treatment  Patient Details  Name: Nicole [name] MRN: [id number] Date of Birth: 07/19/1946 Referring Provider: Bo Merino   Encounter Date: 10/06/2016      PT End of Session - 10/06/16 1045    Visit Number 6   Number of Visits 18   Date for PT Re-Evaluation 10/13/16   Authorization Type Medicare    Authorization Time Period 09/20/16- 11/01/16   Authorization - Visit Number 6   Authorization - Number of Visits 10   PT Start Time 0948   PT Stop Time 1032   PT Time Calculation (min) 44 min   Equipment Utilized During Treatment Gait belt   Activity Tolerance Patient tolerated treatment well   Behavior During Therapy [facility] for tasks assessed/performed      Past Medical History:  Diagnosis Date  . AC (acromioclavicular) joint bone spurs    lt shoulder  . Anemia   . Arthritis   . Carpal tunnel syndrome, bilateral   . Gastroesophageal reflux   . Headache    recent visit to ER @ [facility] for severe headache  . Hypertension   . Lumbar stenosis    Hx of ESIs by Dr. Nelva Bush  . Polyarthralgia   . Polymyalgia ([place])   . Shingles   . Spinal stenosis     Past Surgical History:  Procedure Laterality Date  . CHOLECYSTECTOMY    . COLONOSCOPY  06/11/2012   Procedure: COLONOSCOPY;  Surgeon: Jamesetta So, MD;  [facility];  Service: Gastroenterology;  Laterality: N/A;  . HYSTEROSCOPY W/D&C N/A 02/25/2014   Procedure: DILATATION AND CURETTAGE /HYSTEROSCOPY;  Surgeon: Florian Buff, MD;  Location: [facility];  Service: Gynecology;  Laterality: N/A;  . POLYPECTOMY N/A 02/25/2014   Procedure: POLYPECTOMY;  Surgeon: Florian Buff, MD;  Location: [facility];  Service: Gynecology;  Laterality: N/A;  . RESECTION DISTAL CLAVICAL Right 03/26/2015   Procedure: OPEN DISTAL CLAVICAL RESECTION ;  Surgeon: Netta Cedars, MD;  Location: [facility];   Service: Orthopedics;  Laterality: Right;    There were no vitals filed for this visit.      Subjective Assessment - 10/06/16 0948    Subjective Pt stated she feels her back is tight and sharp pain in center of lower back and Lt hip, pain scale 7/10 today.     Pertinent History Pt had PT at this location for a related problem in 2012, with good resolution inititally, but January 2017 came back and gradually getting worse. Spinal stenosis diagnosed. Pt reports her weight has been stable for most of her adult years. She has needed a RW since ~March due to progressive weakness, previosuly SPC intermittently for years. Foot drop reported to become more noticeable this year also.    Patient Stated Goals Return to walking and being more independent with ADL, meals. About 2 years, pt had no limitations walking for prolonged.    Currently in Pain? Yes   Pain Score 7    Pain Location Back  LB and Lt hip   Pain Orientation Left   Pain Descriptors / Indicators Tightness;Sharp   Pain Type Chronic pain   Pain Radiating Towards proximal Lt hip, no radicular symptoms today.  Does c/o coldness Lt LE    Pain Onset More than a month ago   Pain Frequency Constant   Aggravating Factors  sitting and standing  Pain Relieving Factors being still, sit or lay for a long time   Effect of Pain on Daily Activities decreased ADLs                         [facility] Adult PT Treatment/Exercise - 10/06/16 0001      Lumbar Exercises: Stretches   Passive Hamstring Stretch 3 reps;30 seconds   Passive Hamstring Stretch Limitations supine    Single Knee to Chest Stretch 5 reps;10 seconds   Lower Trunk Rotation 10 seconds  10x 10"     Lumbar Exercises: Supine   Ab Set 20 reps   AB Set Limitations 3 second holds    Glut Set 15 reps   Glut Set Limitations 3 second holds    Bent Knee Raise 10 reps   Bent Knee Raise Limitations core set; AA required for Lt LE   Bridge 10 reps   Other Supine Lumbar  Exercises lumbar flattening on mat talbe with 3 seconds holds 1x15                   PT Short Term Goals - 09/20/16 2118      PT SHORT TERM GOAL #1   Title After 3 weeks patient will demonstrate improved strength AEB 5xSTS s AD in <30 seconds.    Status New     PT SHORT TERM GOAL #2   Title After 3 weeks pt will demonstrate improved tolerance to AMB able to sustain 6MWT at speed >0.6m/s.    Status New     PT SHORT TERM GOAL #3   Title After 3 weeks pt will report three ways to manage symptoms at home and demonstrate independence in a starter HEP established by PT.    Status New           PT Long Term Goals - 09/20/16 2123      PT LONG TERM GOAL #1   Title After 6 weeks patient will demonstrate improved strength AEB 5xSTS s AD in <20 seconds.    Status New     PT LONG TERM GOAL #2   Title After 6 weeks pt will demonstrate improved tolerance to AMB able to sustain 6MWT at speed >0.67m/s.   Status New     PT LONG TERM GOAL #3   Title After 6 weeks pt will demonstrate independence in an advanced HEP established by PT.    Status New     PT LONG TERM GOAL #4   Title After 6 weeks pt will demonstrate improved strength in BLE AEB 4/5 strength in all BLE groups tested at evaluation.    Status New               Plan - 10/06/16 1251    Clinical Impression Statement Pt limited by pain and decreased mobility initially this session especially for Lt LE.  Session focus on improving lumbar mobility for pain control and proximal muscualture strengthening.  Therapist facilitation for control and proper form with therex this session.  Noted decreased mobility and overall strength with Lt LE requiring AAROM through session, believe partially due to fear of pain.  EOS pt reports significant reduction in pain reduced to 4/10.     Rehab Potential Good   PT Frequency 3x / week   PT Duration 6 weeks   PT Treatment/Interventions Therapeutic exercise;Therapeutic  activities;Functional mobility training;Stair training;Gait training;Balance training;Cognitive remediation;Patient/family education;Moist Heat;Passive range of motion;Dry needling   PT Next Visit  Plan continue functional stretching, lumbar mobility, core activation. Continue trial of ankle therband DF assist.    PT Home Exercise Plan 12/05: seated strengthening exercises including posture awareness, scapula retraction, dorsi/plantar flexion, LAQ, ABD with RTB. 12/13- SKTC, lumbar rotation       Patient will benefit from skilled therapeutic intervention in order to improve the following deficits and impairments:  Abnormal gait, Impaired sensation, Improper body mechanics, Pain, Decreased activity tolerance, Difficulty walking, Decreased strength, Obesity  Visit Diagnosis: Pain in right hip  Chronic midline low back pain with left-sided sciatica  Difficulty in walking, not elsewhere classified     Problem List Patient Active Problem List   Diagnosis Date Noted  . PMR (polymyalgia rheumatica) (HCC) 09/27/2016  . DDD (degenerative disc disease), lumbar 09/27/2016  . HTN (hypertension) 06/05/2016  . Chronic pain syndrome 06/05/2016  . Acute diverticulitis 06/02/2016  . Diverticulitis large intestine 06/02/2016  . Spinal stenosis of lumbar region 02/29/2016  . Joint pain 02/29/2016  . Endometrial polyp 02/06/2014  . Postmenopausal bleeding 01/30/2014  . GERD (gastroesophageal reflux disease) 09/29/2013  . Spinal stenosis, thoracic 09/29/2013  . Shingles 09/29/2013  . Thoracic or lumbosacral neuritis or radiculitis, unspecified 05/04/2011  . Abnormality of gait 05/04/2011  . Muscle weakness (generalized) 05/04/2011  . KNEE, ARTHRITIS, DEGEN./OSTEO 04/07/2009  . KNEE PAIN 04/07/2009   Ihor Austin, [place]; Hardy  Aldona Lento 10/06/2016, 12:56 PM  [place], [place], [address] Phone:  [phone]   Fax:  [phone]  Name: Nicole [name] MRN: [id number] Date of Birth: 25-Apr-1946

## 2016-10-09 ENCOUNTER — Ambulatory Visit (HOSPITAL_COMMUNITY): Payer: Medicare Other | Admitting: Physical Therapy

## 2016-10-09 DIAGNOSIS — R262 Difficulty in walking, not elsewhere classified: Secondary | ICD-10-CM

## 2016-10-09 DIAGNOSIS — M5442 Lumbago with sciatica, left side: Secondary | ICD-10-CM

## 2016-10-09 DIAGNOSIS — M25551 Pain in right hip: Secondary | ICD-10-CM

## 2016-10-09 DIAGNOSIS — G8929 Other chronic pain: Secondary | ICD-10-CM

## 2016-10-09 NOTE — Therapy (Signed)
[place], [place], [address] Phone: [phone]   Fax:  [phone]  Physical Therapy Treatment  Patient Details  Name: Nicole [name] MRN: [id number] Date of Birth: 05-03-1946 Referring Provider: Bo Merino   Encounter Date: 10/09/2016      PT End of Session - 10/09/16 1214    Visit Number 7   Number of Visits 18   Date for PT Re-Evaluation 10/13/16   Authorization Type Medicare    Authorization Time Period 09/20/16- 11/01/16   Authorization - Visit Number 7   Authorization - Number of Visits 10   PT Start Time 6389   PT Stop Time 1120   PT Time Calculation (min) 40 min   Equipment Utilized During Treatment Gait belt   Activity Tolerance Patient tolerated treatment well   Behavior During Therapy [facility] for tasks assessed/performed      Past Medical History:  Diagnosis Date  . AC (acromioclavicular) joint bone spurs    lt shoulder  . Anemia   . Arthritis   . Carpal tunnel syndrome, bilateral   . Gastroesophageal reflux   . Headache    recent visit to ER @ [facility] for severe headache  . Hypertension   . Lumbar stenosis    Hx of ESIs by Dr. Nelva Bush  . Polyarthralgia   . Polymyalgia ([place])   . Shingles   . Spinal stenosis     Past Surgical History:  Procedure Laterality Date  . CHOLECYSTECTOMY    . COLONOSCOPY  06/11/2012   Procedure: COLONOSCOPY;  Surgeon: Jamesetta So, MD;  [facility];  Service: Gastroenterology;  Laterality: N/A;  . HYSTEROSCOPY W/D&C N/A 02/25/2014   Procedure: DILATATION AND CURETTAGE /HYSTEROSCOPY;  Surgeon: Florian Buff, MD;  Location: [facility];  Service: Gynecology;  Laterality: N/A;  . POLYPECTOMY N/A 02/25/2014   Procedure: POLYPECTOMY;  Surgeon: Florian Buff, MD;  Location: [facility];  Service: Gynecology;  Laterality: N/A;  . RESECTION DISTAL CLAVICAL Right 03/26/2015   Procedure: OPEN DISTAL CLAVICAL RESECTION ;  Surgeon: Netta Cedars, MD;  Location: [place];   Service: Orthopedics;  Laterality: Right;    There were no vitals filed for this visit.      Subjective Assessment - 10/09/16 1223    Subjective Pt states her back and Lt knee continue to hurt.  Reports pain of 7/10 currently.    Currently in Pain? Yes   Pain Score 7    Pain Location Back   Pain Orientation Left   Pain Descriptors / Indicators Tightness;Sharp   Pain Type Chronic pain                         [facility] Adult PT Treatment/Exercise - 10/09/16 0001      Lumbar Exercises: Stretches   Passive Hamstring Stretch 3 reps;30 seconds   Passive Hamstring Stretch Limitations supine    Single Knee to Chest Stretch 3 reps;20 seconds   Single Knee to Chest Stretch Limitations PROM   Lower Trunk Rotation 10 seconds   Lower Trunk Rotation Limitations 5 reps   Piriformis Stretch 3 reps;30 seconds   Piriformis Stretch Limitations PROM supine      Lumbar Exercises: Supine   Ab Set 20 reps   AB Set Limitations 3 second holds    Glut Set 20 reps   Glut Set Limitations 3 second holds    Bent Knee Raise 15 reps   Bent Knee  Raise Limitations core set; AA required for Lt LE   Bridge 10 reps   Other Supine Lumbar Exercises SAQ 10 reps 3" holds each, heelslides 5 reps each                  PT Short Term Goals - 09/20/16 2118      PT SHORT TERM GOAL #1   Title After 3 weeks patient will demonstrate improved strength AEB 5xSTS s AD in <30 seconds.    Status New     PT SHORT TERM GOAL #2   Title After 3 weeks pt will demonstrate improved tolerance to AMB able to sustain 6MWT at speed >0.21m/s.    Status New     PT SHORT TERM GOAL #3   Title After 3 weeks pt will report three ways to manage symptoms at home and demonstrate independence in a starter HEP established by PT.    Status New           PT Long Term Goals - 09/20/16 2123      PT LONG TERM GOAL #1   Title After 6 weeks patient will demonstrate improved strength AEB 5xSTS s AD in <20 seconds.     Status New     PT LONG TERM GOAL #2   Title After 6 weeks pt will demonstrate improved tolerance to AMB able to sustain 6MWT at speed >0.59m/s.   Status New     PT LONG TERM GOAL #3   Title After 6 weeks pt will demonstrate independence in an advanced HEP established by PT.    Status New     PT LONG TERM GOAL #4   Title After 6 weeks pt will demonstrate improved strength in BLE AEB 4/5 strength in all BLE groups tested at evaluation.    Status New               Plan - 10/09/16 1215    Clinical Impression Statement Continued with focus on improving mobility and decreasing pain.  Pt continues to have difficulty with ambulation without presenting with forward bent posturing.  Max of 8 feet using UE on walker correctly with improved posturing before tiring and giving into pain and returning to elbows on walker and forward bent.  Lumbar and Lt knee pain limiting mobility and progress most.   Added strengthening exercises for Lt knee today as noted weakness.   Able to complete PROM for LE hamstring, glute and piriformis stretches, however pt with diffiuculty fully relaxing for good stretch.  Pain overall reduced to EOS and faster ambulation noted.     Rehab Potential Good   PT Frequency 3x / week   PT Duration 6 weeks   PT Treatment/Interventions Therapeutic exercise;Therapeutic activities;Functional mobility training;Stair training;Gait training;Balance training;Cognitive remediation;Patient/family education;Moist Heat;Passive range of motion;Dry needling   PT Next Visit Plan continue functional stretching, lumbar mobility, core activation.   PT Home Exercise Plan 12/05: seated strengthening exercises including posture awareness, scapula retraction, dorsi/plantar flexion, LAQ, ABD with RTB. 12/13- SKTC, lumbar rotation       Patient will benefit from skilled therapeutic intervention in order to improve the following deficits and impairments:  Abnormal gait, Impaired sensation,  Improper body mechanics, Pain, Decreased activity tolerance, Difficulty walking, Decreased strength, Obesity  Visit Diagnosis: Pain in right hip  Chronic midline low back pain with left-sided sciatica  Difficulty in walking, not elsewhere classified     Problem List Patient Active Problem List   Diagnosis Date Noted  .  PMR (polymyalgia rheumatica) (HCC) 09/27/2016  . DDD (degenerative disc disease), lumbar 09/27/2016  . HTN (hypertension) 06/05/2016  . Chronic pain syndrome 06/05/2016  . Acute diverticulitis 06/02/2016  . Diverticulitis large intestine 06/02/2016  . Spinal stenosis of lumbar region 02/29/2016  . Joint pain 02/29/2016  . Endometrial polyp 02/06/2014  . Postmenopausal bleeding 01/30/2014  . GERD (gastroesophageal reflux disease) 09/29/2013  . Spinal stenosis, thoracic 09/29/2013  . Shingles 09/29/2013  . Thoracic or lumbosacral neuritis or radiculitis, unspecified 05/04/2011  . Abnormality of gait 05/04/2011  . Muscle weakness (generalized) 05/04/2011  . KNEE, ARTHRITIS, DEGEN./OSTEO 04/07/2009  . KNEE PAIN 04/07/2009    Teena Irani, PTA/CLT [phone]  10/09/2016, 12:25 PM  [place] [address] [place], [place], [address] Phone: [phone]   Fax:  [phone]  Name: Nicole [name] MRN: [id number] Date of Birth: 1946/05/06

## 2016-10-11 ENCOUNTER — Ambulatory Visit (HOSPITAL_COMMUNITY): Payer: Medicare Other

## 2016-10-11 DIAGNOSIS — G8929 Other chronic pain: Secondary | ICD-10-CM

## 2016-10-11 DIAGNOSIS — M25551 Pain in right hip: Secondary | ICD-10-CM

## 2016-10-11 DIAGNOSIS — M5442 Lumbago with sciatica, left side: Secondary | ICD-10-CM | POA: Diagnosis not present

## 2016-10-11 DIAGNOSIS — R262 Difficulty in walking, not elsewhere classified: Secondary | ICD-10-CM | POA: Diagnosis not present

## 2016-10-11 NOTE — Therapy (Signed)
[place], [place], [address] Phone: [phone]   Fax:  [phone]  Physical Therapy Treatment  Patient Details  Name: Nicole [name] MRN: [id number] Date of Birth: Apr 04, 1946 Referring Provider: Cy Blamer  Encounter Date: 10/11/2016      PT End of Session - 10/11/16 1230    Visit Number 8   Number of Visits 18   Date for PT Re-Evaluation 10/13/16   Authorization Type Medicare    Authorization Time Period 09/20/16- 11/01/16   Authorization - Visit Number 8   Authorization - Number of Visits 10   PT Start Time 1120   PT Stop Time 1156   PT Time Calculation (min) 36 min   Equipment Utilized During Treatment Gait belt   Activity Tolerance Patient tolerated treatment well   Behavior During Therapy [facility] for tasks assessed/performed      Past Medical History:  Diagnosis Date  . AC (acromioclavicular) joint bone spurs    lt shoulder  . Anemia   . Arthritis   . Carpal tunnel syndrome, bilateral   . Gastroesophageal reflux   . Headache    recent visit to ER @ [facility] for severe headache  . Hypertension   . Lumbar stenosis    Hx of ESIs by Dr. Nelva Bush  . Polyarthralgia   . Polymyalgia ([place])   . Shingles   . Spinal stenosis     Past Surgical History:  Procedure Laterality Date  . CHOLECYSTECTOMY    . COLONOSCOPY  06/11/2012   Procedure: COLONOSCOPY;  Surgeon: Jamesetta So, MD;  [facility];  Service: Gastroenterology;  Laterality: N/A;  . HYSTEROSCOPY W/D&C N/A 02/25/2014   Procedure: DILATATION AND CURETTAGE /HYSTEROSCOPY;  Surgeon: Florian Buff, MD;  Location: [facility];  Service: Gynecology;  Laterality: N/A;  . POLYPECTOMY N/A 02/25/2014   Procedure: POLYPECTOMY;  Surgeon: Florian Buff, MD;  Location: [facility];  Service: Gynecology;  Laterality: N/A;  . RESECTION DISTAL CLAVICAL Right 03/26/2015   Procedure: OPEN DISTAL CLAVICAL RESECTION ;  Surgeon: Netta Cedars, MD;  Location: [place];   Service: Orthopedics;  Laterality: Right;    There were no vitals filed for this visit.      Subjective Assessment - 10/11/16 1124    Subjective Pt report she feels she has made some progress overall since starting therapy. She thinks that he LLE pain doe snot stick around as long. Pt report when she is not fatiuged that she is able to walk a bit mor eeasily and a bit farther.    Pertinent History Pt had PT at this location for a related problem in 2012, with good resolution inititally, but January 2017 came back and gradually getting worse. Spinal stenosis diagnosed. Pt reports her weight has been stable for most of her adult years. She has needed a RW since ~March due to progressive weakness, previosuly SPC intermittently for years. Foot drop reported to become more noticeable this year also.    Limitations Standing;Walking   How long can you sit comfortably? Not limited   How long can you stand comfortably? 3-4 minutes at evaluation; pt is not a complete historian at reassessment.    How long can you walk comfortably? 36ft bursts in clinic; at evaluation; pt is not a complete historian at reassessment.    Diagnostic tests Medical imaging.    Patient Stated Goals Return to walking and being more independent with ADL, meals. About 2 years, pt had no  limitations walking for prolonged.    Currently in Pain? Yes   Pain Score 6    Pain Orientation Left;Medial   Pain Type Chronic pain   Pain Onset More than a month ago   Pain Frequency Constant   Aggravating Factors  standing and walking             [facility] PT Assessment - 10/11/16 0001      Assessment   Medical Diagnosis Lumbar spinal stenosis with neurogenic claudication   Referring Provider Shali Deveshwar   Onset Date/Surgical Date 10/24/15  estiamted   Next MD Visit not sure   Prior Therapy 2012 Out patient PT, and HHPT October 2017     Precautions   Precautions None     Restrictions   Weight Bearing Restrictions No      Balance Screen   Has the patient fallen in the past 6 months No   Has the patient had a decrease in activity level because of a fear of falling?  No   Is the patient reluctant to leave their home because of a fear of falling?  No     Home Environment   Living Environment Private residence   Living Arrangements Alone   Available Help at Discharge Family;Friend(s)  daughter and brother assist with IADL   Type of [place] to enter  2 partial steps   Entrance Stairs-Number of Steps 2   Entrance Stairs-Rails Can reach both   [place] One level   [place] - 2 wheels;Walker - 4 wheels;Cane - single point  would benefit from shower chair     Prior Function   Level of Independence Independent with household mobility with device;Needs assistance with ADLs   Toileting Supervision/set-up   Dressing Supervision/set-up   Comments bathing required mod assist     Sensation   Light Touch Impaired Details Impaired LLE  pt reports near symmetry, improved since eval     Strength   Right Hip Flexion 4-/5   Right Hip ABduction 4+/5  seated, hip at 90 degrees   Right Hip ADduction 4-/5   Left Hip ABduction 3-/5  seated, hip at 90 degrees   Left Hip ADduction 3-/5   Right Knee Flexion 4-/5   Right Knee Extension 4-/5   Left Knee Flexion 4-/5   Left Knee Extension 3-/5     Ambulation/Gait   Ambulation Distance (Feet) 25 Feet   Gait velocity 0.58m/s                             PT Education - 10/11/16 1229    Education provided Yes   Education Details Pt confused about what compliance with HEP should look like. Explained the necessity of dosage specificity and consistency with performance to allow for objective progression.    Person(s) Educated Patient   Methods Explanation;Demonstration   Comprehension Verbalized understanding;Returned demonstration          PT Short Term Goals - 10/11/16 1136      PT SHORT TERM GOAL #1    Title After 3 weeks patient will demonstrate improved strength AEB 5xSTS s AD in <30 seconds.    Baseline Performs 4x in 66s, UE support on legs only.    Status On-going     PT SHORT TERM GOAL #2   Title After 3 weeks pt will demonstrate improved tolerance to AMB able to sustain 6MWT at speed >  0.49m/s.    Baseline Pt maintains LLE foot drop and quads weakness withotu approppriate AD at this time to sustain prolonged gait.    Status Deferred     PT SHORT TERM GOAL #3   Title After 3 weeks pt will report three ways to manage symptoms at home and demonstrate independence in a starter HEP established by PT.    Baseline Pt still limited in self management of symptoms; some HEP activities are too difficult to perform indep.    Status On-going           PT Long Term Goals - 10/11/16 1143      PT LONG TERM GOAL #1   Title After 6 weeks patient will demonstrate improved strength AEB 5xSTS s AD in <20 seconds.    Status On-going     PT LONG TERM GOAL #2   Title After 6 weeks pt will demonstrate improved tolerance to AMB able to sustain 6MWT at speed >0.69m/s.   Status On-going     PT LONG TERM GOAL #3   Title After 6 weeks pt will demonstrate independence in an advanced HEP established by PT.    Status On-going     PT LONG TERM GOAL #4   Title After 6 weeks pt will demonstrate improved strength in BLE AEB 4/5 strength in all BLE groups tested at evaluation.    Status On-going               Plan - 10/11/16 1231    Clinical Impression Statement Reassessment performed today. Pt demonstrating minimal progress, but most noatbly in pain control, much less severe overall and with prolonged periods of remittance. Progress demonstrated in AMB/gait speed is seen in improvement from 0.79m/s to 0.26m/s, but distances remain limited to less than 25'. isolated strength testing demonstrates no improvement overall and only minimal improvement in select RLE groups. Functional strength is also  minimally improved AEB 4xSTS: 66s, but this is has not been an appropriate area of intervention as of yet, at least in high volumes. Pt has had some success from flexion based protocols, especialyl in the reduction of stenosis related pain and neurogenic claudication, however core weakness precludes and long term stabilization of the lumbopelvic complex acheivable during functional mobility outside of supine posturing.    Rehab Potential Good   PT Frequency 3x / week   PT Duration 6 weeks   PT Treatment/Interventions Therapeutic exercise;Therapeutic activities;Functional mobility training;Stair training;Gait training;Balance training;Cognitive remediation;Patient/family education;Moist Heat;Passive range of motion;Dry needling   PT Next Visit Plan Going forward minimize stretching focus to no greater than 10 minutes with an emphasis on performance at home with independence, as this should be daily maintenence at this point; continue to progress global abdominal strength, particularly in flexion; bring focus back to isolated strengthening of BLE strength deficits seen in reassessment. Eventual progression (2-3weeks out) should include more functional strengthening with transfers and gait as appropriate.     PT Home Exercise Plan Trial new HEP, focus on compliance and consistentcy with dosage, as well as independent performance. DC old HEP.    Consulted and Agree with Plan of Care Patient      Patient will benefit from skilled therapeutic intervention in order to improve the following deficits and impairments:  Abnormal gait, Impaired sensation, Improper body mechanics, Pain, Decreased activity tolerance, Difficulty walking, Decreased strength, Obesity  Visit Diagnosis: Pain in right hip  Chronic midline low back pain with left-sided sciatica  Difficulty in walking, not elsewhere classified  G-Codes - 10/11/16 1252    Functional Assessment Tool Used Clinical judgment   Functional  Limitation Mobility: Walking and moving around   Mobility: Walking and Moving Around Current Status [phone] At least 80 percent but less than 100 percent impaired, limited or restricted   Mobility: Walking and Moving Around Goal Status [phone]) At least 80 percent but less than 100 percent impaired, limited or restricted      Problem List Patient Active Problem List   Diagnosis Date Noted  . PMR (polymyalgia rheumatica) (HCC) 09/27/2016  . DDD (degenerative disc disease), lumbar 09/27/2016  . HTN (hypertension) 06/05/2016  . Chronic pain syndrome 06/05/2016  . Acute diverticulitis 06/02/2016  . Diverticulitis large intestine 06/02/2016  . Spinal stenosis of lumbar region 02/29/2016  . Joint pain 02/29/2016  . Endometrial polyp 02/06/2014  . Postmenopausal bleeding 01/30/2014  . GERD (gastroesophageal reflux disease) 09/29/2013  . Spinal stenosis, thoracic 09/29/2013  . Shingles 09/29/2013  . Thoracic or lumbosacral neuritis or radiculitis, unspecified 05/04/2011  . Abnormality of gait 05/04/2011  . Muscle weakness (generalized) 05/04/2011  . KNEE, ARTHRITIS, DEGEN./OSTEO 04/07/2009  . KNEE PAIN 04/07/2009    12:54 PM, 10/11/16 [name], PT, DPT Physical Therapist at [facility] [phone] (office)     [place] [address] [place], [place], [address] Phone: [phone]   Fax:  [phone]  Name: Nicole [name] MRN: [id number] Date of Birth: [birth year]

## 2016-10-13 ENCOUNTER — Ambulatory Visit (HOSPITAL_COMMUNITY): Payer: Medicare Other

## 2016-10-13 DIAGNOSIS — G8929 Other chronic pain: Secondary | ICD-10-CM

## 2016-10-13 DIAGNOSIS — M25551 Pain in right hip: Secondary | ICD-10-CM | POA: Diagnosis not present

## 2016-10-13 DIAGNOSIS — M5442 Lumbago with sciatica, left side: Secondary | ICD-10-CM

## 2016-10-13 DIAGNOSIS — R262 Difficulty in walking, not elsewhere classified: Secondary | ICD-10-CM

## 2016-10-13 NOTE — Patient Instructions (Signed)
Lower Trunk Rotation Stretch    Keeping back flat and feet together, rotate knees to left side. Hold 10 seconds. Repeat 10 times per set. Do 1 sets per session. Do 2 sessions per day.  http://orth.exer.us/123   Copyright  VHI. All rights reserved.

## 2016-10-13 NOTE — Therapy (Signed)
[place], [place], [address] Phone: [phone]   Fax:  [phone]  Physical Therapy Treatment  Patient Details  Name: Nicole [name] MRN: [id number] Date of Birth: Aug 14, 1946 Referring Provider: Cy Blamer  Encounter Date: 10/13/2016      PT End of Session - 10/13/16 1746    Visit Number 9   Number of Visits 18   Date for PT Re-Evaluation 10/13/16   Authorization Type Medicare    Authorization Time Period 09/20/16- 11/01/16   Authorization - Visit Number 9   Authorization - Number of Visits 10   PT Start Time 7341   PT Stop Time 1812   PT Time Calculation (min) 38 min   Activity Tolerance Patient limited by fatigue   Behavior During Therapy [facility] for tasks assessed/performed      Past Medical History:  Diagnosis Date  . AC (acromioclavicular) joint bone spurs    lt shoulder  . Anemia   . Arthritis   . Carpal tunnel syndrome, bilateral   . Gastroesophageal reflux   . Headache    recent visit to ER @ [facility] for severe headache  . Hypertension   . Lumbar stenosis    Hx of ESIs by Dr. Nelva Bush  . Polyarthralgia   . Polymyalgia ([place])   . Shingles   . Spinal stenosis     Past Surgical History:  Procedure Laterality Date  . CHOLECYSTECTOMY    . COLONOSCOPY  06/11/2012   Procedure: COLONOSCOPY;  Surgeon: Jamesetta So, MD;  [facility];  Service: Gastroenterology;  Laterality: N/A;  . HYSTEROSCOPY W/D&C N/A 02/25/2014   Procedure: DILATATION AND CURETTAGE /HYSTEROSCOPY;  Surgeon: Florian Buff, MD;  Location[facility];  Service: Gynecology;  Laterality: N/A;  . POLYPECTOMY N/A 02/25/2014   Procedure: POLYPECTOMY;  Surgeon: Florian Buff, MD;  Location: [facility];  Service: Gynecology;  Laterality: N/A;  . RESECTION DISTAL CLAVICAL Right 03/26/2015   Procedure: OPEN DISTAL CLAVICAL RESECTION ;  Surgeon: Netta Cedars, MD;  Location: [place];  Service: Orthopedics;  Laterality: Right;    There  were no vitals filed for this visit.      Subjective Assessment - 10/13/16 1734    Subjective Pt stated she is mainly tired due to time of apt today (1730 PM),  Pt reports current LBP 4/10 achey today.     Pertinent History Pt had PT at this location for a related problem in 2012, with good resolution inititally, but January 2017 came back and gradually getting worse. Spinal stenosis diagnosed. Pt reports her weight has been stable for most of her adult years. She has needed a RW since ~March due to progressive weakness, previosuly SPC intermittently for years. Foot drop reported to become more noticeable this year also.    Patient Stated Goals Return to walking and being more independent with ADL, meals. About 2 years, pt had no limitations walking for prolonged.    Currently in Pain? Yes   Pain Score 4    Pain Location Back   Pain Orientation Lower   Pain Descriptors / Indicators Aching   Pain Type Chronic pain   Pain Radiating Towards proximal Lt hip and Bil knee pain, no radicular symptoms today.     Pain Onset More than a month ago   Pain Frequency Constant   Aggravating Factors  standing and walking   Pain Relieving Factors being still, sit or lay for a long time  Effect of Pain on Daily Activities decreased ADLs                         [facility] Adult PT Treatment/Exercise - 10/13/16 0001      Lumbar Exercises: Stretches   Lower Trunk Rotation 10 seconds   Lower Trunk Rotation Limitations 10 reps   Prone Mid Back Stretch 5 reps;10 seconds   Prone Mid Back Stretch Limitations Seated forward stretch for back stretch     Lumbar Exercises: Seated   Other Seated Lumbar Exercises seated marching 10x 3" (decreased ability with Lt LE); seated ABD with GTB 10x5"     Lumbar Exercises: Supine   Bent Knee Raise 10 reps   Other Supine Lumbar Exercises Supine LAQ with towel assistance, unable to complete Lt LE with towel assistance                  PT Short  Term Goals - 10/11/16 1136      PT SHORT TERM GOAL #1   Title After 3 weeks patient will demonstrate improved strength AEB 5xSTS s AD in <30 seconds.    Baseline Performs 4x in 66s, UE support on legs only.    Status On-going     PT SHORT TERM GOAL #2   Title After 3 weeks pt will demonstrate improved tolerance to AMB able to sustain 6MWT at speed >0.48m/s.    Baseline Pt maintains LLE foot drop and quads weakness withotu approppriate AD at this time to sustain prolonged gait.    Status Deferred     PT SHORT TERM GOAL #3   Title After 3 weeks pt will report three ways to manage symptoms at home and demonstrate independence in a starter HEP established by PT.    Baseline Pt still limited in self management of symptoms; some HEP activities are too difficult to perform indep.    Status On-going           PT Long Term Goals - 10/11/16 1143      PT LONG TERM GOAL #1   Title After 6 weeks patient will demonstrate improved strength AEB 5xSTS s AD in <20 seconds.    Status On-going     PT LONG TERM GOAL #2   Title After 6 weeks pt will demonstrate improved tolerance to AMB able to sustain 6MWT at speed >0.61m/s.   Status On-going     PT LONG TERM GOAL #3   Title After 6 weeks pt will demonstrate independence in an advanced HEP established by PT.    Status On-going     PT LONG TERM GOAL #4   Title After 6 weeks pt will demonstrate improved strength in BLE AEB 4/5 strength in all BLE groups tested at evaluation.    Status On-going               Plan - 10/13/16 1755    Clinical Impression Statement Session foucs on education with new HEP following reassessment last session.  Pt able to complete all exercises with some exercises modified to assure correct form and technqiue at home. Session focus on improving core stability and lumbar mobility with flexion based protocol.  Pt demonstrated increased fatigue and difficulty with Lt LE motion partially due to weakness and increased  fatigue due to time of apt today.  No reoprts of increased pain through session.   Rehab Potential Good   PT Frequency 3x / week   PT Duration 6  weeks   PT Treatment/Interventions Therapeutic exercise;Therapeutic activities;Functional mobility training;Stair training;Gait training;Balance training;Cognitive remediation;Patient/family education;Moist Heat;Passive range of motion;Dry needling   PT Next Visit Plan Going forward minimize stretching focus to no greater than 10 minutes with an emphasis on performance at home with independence, as this should be daily maintenence at this point; continue to progress global abdominal strength, particularly in flexion; bring focus back to isolated strengthening of BLE strength deficits seen in reassessment. Eventual progression (2-3weeks out) should include more functional strengthening with transfers and gait as appropriate.     PT Home Exercise Plan New HEP:  seated marching, seated ABD with theraband; seated lower back stretch; supine LTR; supine bent knee raise; supine LAQs      Patient will benefit from skilled therapeutic intervention in order to improve the following deficits and impairments:  Abnormal gait, Impaired sensation, Improper body mechanics, Pain, Decreased activity tolerance, Difficulty walking, Decreased strength, Obesity  Visit Diagnosis: Pain in right hip  Chronic midline low back pain with left-sided sciatica  Difficulty in walking, not elsewhere classified     Problem List Patient Active Problem List   Diagnosis Date Noted  . PMR (polymyalgia rheumatica) (HCC) 09/27/2016  . DDD (degenerative disc disease), lumbar 09/27/2016  . HTN (hypertension) 06/05/2016  . Chronic pain syndrome 06/05/2016  . Acute diverticulitis 06/02/2016  . Diverticulitis large intestine 06/02/2016  . Spinal stenosis of lumbar region 02/29/2016  . Joint pain 02/29/2016  . Endometrial polyp 02/06/2014  . Postmenopausal bleeding 01/30/2014  . GERD  (gastroesophageal reflux disease) 09/29/2013  . Spinal stenosis, thoracic 09/29/2013  . Shingles 09/29/2013  . Thoracic or lumbosacral neuritis or radiculitis, unspecified 05/04/2011  . Abnormality of gait 05/04/2011  . Muscle weakness (generalized) 05/04/2011  . KNEE, ARTHRITIS, DEGEN./OSTEO 04/07/2009  . KNEE PAIN 04/07/2009   Ihor Austin, [place]; [place]  Aldona Lento 10/13/2016, 6:23 PM  [place], [place], [address] Phone: [phone]   Fax:  [phone]  Name: Nicole [name] MRN: [id number] Date of Birth: 1945/12/17

## 2016-10-17 ENCOUNTER — Ambulatory Visit: Payer: Medicare Other | Admitting: Rheumatology

## 2016-10-18 ENCOUNTER — Ambulatory Visit (HOSPITAL_COMMUNITY): Payer: Medicare Other | Admitting: Physical Therapy

## 2016-10-18 DIAGNOSIS — M25551 Pain in right hip: Secondary | ICD-10-CM

## 2016-10-18 DIAGNOSIS — R262 Difficulty in walking, not elsewhere classified: Secondary | ICD-10-CM

## 2016-10-18 DIAGNOSIS — G8929 Other chronic pain: Secondary | ICD-10-CM | POA: Diagnosis not present

## 2016-10-18 DIAGNOSIS — M5442 Lumbago with sciatica, left side: Secondary | ICD-10-CM | POA: Diagnosis not present

## 2016-10-18 NOTE — Therapy (Signed)
[place], [place], [address] Phone: [phone]   Fax:  [phone]  Physical Therapy Treatment  Patient Details  Name: Nicole [name] MRN: [id number] Date of Birth: [birth year] Referring Provider: Cy Blamer  Encounter Date: 10/18/2016      PT End of Session - 10/18/16 1741    Visit Number 10   Number of Visits 18   Date for PT Re-Evaluation 10/30/16   Authorization Type Medicare (G-codes done 8th session)   Authorization Time Period 09/20/16- 11/01/16   Authorization - Visit Number 8   Authorization - Number of Visits 18   PT Start Time 5284  subtracted time for MD phone calls    PT Stop Time 1730   PT Time Calculation (min) 38 min   Activity Tolerance Patient tolerated treatment well   Behavior During Therapy [facility] for tasks assessed/performed      Past Medical History:  Diagnosis Date  . AC (acromioclavicular) joint bone spurs    lt shoulder  . Anemia   . Arthritis   . Carpal tunnel syndrome, bilateral   . Gastroesophageal reflux   . Headache    recent visit to ER @ [facility] for severe headache  . Hypertension   . Lumbar stenosis    Hx of ESIs by Dr. Nelva Bush  . Polyarthralgia   . Polymyalgia ([place])   . Shingles   . Spinal stenosis     Past Surgical History:  Procedure Laterality Date  . CHOLECYSTECTOMY    . COLONOSCOPY  06/11/2012   Procedure: COLONOSCOPY;  Surgeon: Jamesetta So, MD;  [facility];  Service: Gastroenterology;  Laterality: N/A;  . HYSTEROSCOPY W/D&C N/A 02/25/2014   Procedure: DILATATION AND CURETTAGE /HYSTEROSCOPY;  Surgeon: Florian Buff, MD;  Location: [facility];  Service: Gynecology;  Laterality: N/A;  . POLYPECTOMY N/A 02/25/2014   Procedure: POLYPECTOMY;  Surgeon: Florian Buff, MD;  Location: [facility];  Service: Gynecology;  Laterality: N/A;  . RESECTION DISTAL CLAVICAL Right 03/26/2015   Procedure: OPEN DISTAL CLAVICAL RESECTION ;  Surgeon: Netta Cedars, MD;   Location: [place];  Service: Orthopedics;  Laterality: Right;    There were no vitals filed for this visit.      Subjective Assessment - 10/18/16 1652    Subjective Patient arrives today stating that she is doing OK, pain is slowly getting better. She continues to feel more tired later in the day. No falls since last time she was seen.    Pertinent History Pt had PT at this location for a related problem in 2012, with good resolution inititally, but January 2017 came back and gradually getting worse. Spinal stenosis diagnosed. Pt reports her weight has been stable for most of her adult years. She has needed a RW since ~March due to progressive weakness, previosuly SPC intermittently for years. Foot drop reported to become more noticeable this year also.    Patient Stated Goals Return to walking and being more independent with ADL, meals. About 2 years, pt had no limitations walking for prolonged.    Currently in Pain? Yes   Pain Score 4    Pain Location Back   Pain Orientation Lower                         [facility] Adult PT Treatment/Exercise - 10/18/16 0001      Lumbar Exercises: Seated   Other Seated Lumbar Exercises 3 way flexion based  cone reaches 1x4 with min guard      Lumbar Exercises: Supine   Ab Set 20 reps   AB Set Limitations 3 second holds    Clam --   Bent Knee Raise 15 reps   Bridge 15 reps   Other Supine Lumbar Exercises --       lumbar rotations, SKTC 5x10 second each; piriformis stretch 2x30 seconds B          PT Education - 10/18/16 1739    Education provided Yes   Education Details family education regarding anatomy of stenosis and origin of pain; no known cause (that DPT is aware of) for spinal stenosis; purpose of all exercises performed today and relation to managment of pain; benefit of flexion based exercises for stenosis; expected posture in spinal stenosis    Person(s) Educated Patient;Child(ren)   Methods Explanation;Demonstration    Comprehension Verbalized understanding          PT Short Term Goals - 10/11/16 1136      PT SHORT TERM GOAL #1   Title After 3 weeks patient will demonstrate improved strength AEB 5xSTS s AD in <30 seconds.    Baseline Performs 4x in 66s, UE support on legs only.    Status On-going     PT SHORT TERM GOAL #2   Title After 3 weeks pt will demonstrate improved tolerance to AMB able to sustain 6MWT at speed >0.65m/s.    Baseline Pt maintains LLE foot drop and quads weakness withotu approppriate AD at this time to sustain prolonged gait.    Status Deferred     PT SHORT TERM GOAL #3   Title After 3 weeks pt will report three ways to manage symptoms at home and demonstrate independence in a starter HEP established by PT.    Baseline Pt still limited in self management of symptoms; some HEP activities are too difficult to perform indep.    Status On-going           PT Long Term Goals - 10/11/16 1143      PT LONG TERM GOAL #1   Title After 6 weeks patient will demonstrate improved strength AEB 5xSTS s AD in <20 seconds.    Status On-going     PT LONG TERM GOAL #2   Title After 6 weeks pt will demonstrate improved tolerance to AMB able to sustain 6MWT at speed >0.21m/s.   Status On-going     PT LONG TERM GOAL #3   Title After 6 weeks pt will demonstrate independence in an advanced HEP established by PT.    Status On-going     PT LONG TERM GOAL #4   Title After 6 weeks pt will demonstrate improved strength in BLE AEB 4/5 strength in all BLE groups tested at evaluation.    Status On-going               Plan - 10/18/16 1744    Clinical Impression Statement Continued with flexion based exercises as well as proximal strengthening today; introduced 3 way flexion based reaching exercise to attempt to mobilize lumbar flexion more extensively today. Patient's son was present during this treatment session and asked numerous questions, was provided with extensive education  thoroughly addressing all questions/concerns today (see education section for details).    Rehab Potential Good   PT Frequency 3x / week   PT Duration 6 weeks   PT Treatment/Interventions Therapeutic exercise;Therapeutic activities;Functional mobility training;Stair training;Gait training;Balance training;Cognitive remediation;Patient/family education;Moist Heat;Passive range of motion;Dry  needling   PT Next Visit Plan Going forward minimize stretching focus to no greater than 10 minutes with an emphasis on performance at home with independence, as this should be daily maintenence at this point; continue to progress global abdominal strength, particularly in flexion; bring focus back to isolated strengthening of BLE strength deficits seen in reassessment. Eventual progression (2-3weeks out) should include more functional strengthening with transfers and gait as appropriate.     PT Home Exercise Plan New HEP:  seated marching, seated ABD with theraband; seated lower back stretch; supine LTR; supine bent knee raise; supine LAQs   Consulted and Agree with Plan of Care Patient      Patient will benefit from skilled therapeutic intervention in order to improve the following deficits and impairments:  Abnormal gait, Impaired sensation, Improper body mechanics, Pain, Decreased activity tolerance, Difficulty walking, Decreased strength, Obesity  Visit Diagnosis: Pain in right hip  Chronic midline low back pain with left-sided sciatica  Difficulty in walking, not elsewhere classified     Problem List Patient Active Problem List   Diagnosis Date Noted  . PMR (polymyalgia rheumatica) (HCC) 09/27/2016  . DDD (degenerative disc disease), lumbar 09/27/2016  . HTN (hypertension) 06/05/2016  . Chronic pain syndrome 06/05/2016  . Acute diverticulitis 06/02/2016  . Diverticulitis large intestine 06/02/2016  . Spinal stenosis of lumbar region 02/29/2016  . Joint pain 02/29/2016  . Endometrial polyp  02/06/2014  . Postmenopausal bleeding 01/30/2014  . GERD (gastroesophageal reflux disease) 09/29/2013  . Spinal stenosis, thoracic 09/29/2013  . Shingles 09/29/2013  . Thoracic or lumbosacral neuritis or radiculitis, unspecified 05/04/2011  . Abnormality of gait 05/04/2011  . Muscle weakness (generalized) 05/04/2011  . KNEE, ARTHRITIS, DEGEN./OSTEO 04/07/2009  . KNEE PAIN 04/07/2009    Deniece Ree PT, DPT [place] [address] [place], [place], [address] Phone: [phone]   Fax:  [phone]  Name: Nicole [name] MRN: [id number] Date of Birth: Nov 24, 1945

## 2016-10-20 ENCOUNTER — Ambulatory Visit (HOSPITAL_COMMUNITY): Payer: Medicare Other

## 2016-10-20 DIAGNOSIS — R262 Difficulty in walking, not elsewhere classified: Secondary | ICD-10-CM

## 2016-10-20 DIAGNOSIS — M5442 Lumbago with sciatica, left side: Secondary | ICD-10-CM

## 2016-10-20 DIAGNOSIS — M25551 Pain in right hip: Secondary | ICD-10-CM | POA: Diagnosis not present

## 2016-10-20 DIAGNOSIS — G8929 Other chronic pain: Secondary | ICD-10-CM | POA: Diagnosis not present

## 2016-10-20 NOTE — Therapy (Signed)
[place], [place], [address] Phone: [phone]   Fax:  [phone]  Physical Therapy Treatment  Patient Details  Name: Nicole [name] MRN: [id number] Date of Birth: 1946-09-23 Referring Provider: Cy Blamer  Encounter Date: 10/20/2016      PT End of Session - 10/20/16 0906    Visit Number 11   Number of Visits 18   Date for PT Re-Evaluation 10/30/16   Authorization Type Medicare (G-codes done 8th session)   Authorization Time Period 09/20/16- 11/01/16   Authorization - Visit Number 11   Authorization - Number of Visits 18   PT Start Time 2671   PT Stop Time 0943   PT Time Calculation (min) 46 min   Equipment Utilized During Treatment Gait belt   Activity Tolerance Patient tolerated treatment well   Behavior During Therapy [facility] for tasks assessed/performed      Past Medical History:  Diagnosis Date  . AC (acromioclavicular) joint bone spurs    lt shoulder  . Anemia   . Arthritis   . Carpal tunnel syndrome, bilateral   . Gastroesophageal reflux   . Headache    recent visit to ER @ [facility] for severe headache  . Hypertension   . Lumbar stenosis    Hx of ESIs by Dr. Nelva Bush  . Polyarthralgia   . Polymyalgia ([place])   . Shingles   . Spinal stenosis     Past Surgical History:  Procedure Laterality Date  . CHOLECYSTECTOMY    . COLONOSCOPY  06/11/2012   Procedure: COLONOSCOPY;  Surgeon: Jamesetta So, MD;  [facility];  Service: Gastroenterology;  Laterality: N/A;  . HYSTEROSCOPY W/D&C N/A 02/25/2014   Procedure: DILATATION AND CURETTAGE /HYSTEROSCOPY;  Surgeon: Florian Buff, MD;  [facility]: [facility];  Service: Gynecology;  Laterality: N/A;  . POLYPECTOMY N/A 02/25/2014   Procedure: POLYPECTOMY;  Surgeon: Florian Buff, MD;  Location: [facility];  Service: Gynecology;  Laterality: N/A;  . RESECTION DISTAL CLAVICAL Right 03/26/2015   Procedure: OPEN DISTAL CLAVICAL RESECTION ;  Surgeon: Netta Cedars, MD;  Location: [place];  Service: Orthopedics;  Laterality: Right;    There were no vitals filed for this visit.      Subjective Assessment - 10/20/16 0854    Subjective Pt stated its an early day today with increased sharp pain lower back with radicular symptoms ending posterior proximal knees pain scale 7/10   Pertinent History Pt had PT at this location for a related problem in 2012, with good resolution inititally, but January 2017 came back and gradually getting worse. Spinal stenosis diagnosed. Pt reports her weight has been stable for most of her adult years. She has needed a RW since ~March due to progressive weakness, previosuly SPC intermittently for years. Foot drop reported to become more noticeable this year also.    Patient Stated Goals Return to walking and being more independent with ADL, meals. About 2 years, pt had no limitations walking for prolonged.    Currently in Pain? Yes   Pain Score 7    Pain Location Back   Pain Orientation Lower   Pain Descriptors / Indicators Sharp   Pain Type Chronic pain   Pain Radiating Towards Posterior Bil LE ending proximal knees   Pain Onset More than a month ago   Pain Frequency Constant   Aggravating Factors  standing and walking   Pain Relieving Factors being still, sit or lay for a long  time   Effect of Pain on Daily Activities decreased ADLs                         [facility] Adult PT Treatment/Exercise - 10/20/16 0001      Bed Mobility   Bed Mobility Sit to Sidelying Right   Sit to Sidelying Right 4: Min guard  Cueing for technique     Lumbar Exercises: Stretches   Lower Trunk Rotation 10 seconds   Lower Trunk Rotation Limitations 10 reps   Prone Mid Back Stretch 5 reps;10 seconds   Prone Mid Back Stretch Limitations Seated forward stretch for back stretch     Lumbar Exercises: Standing   Heel Raises 5 reps   Heel Raises Limitations Toe raises 5x     Lumbar Exercises: Seated   Hip Flexion on Ball  10 reps   Hip Flexion on Ball Limitations seated marching with AA for Lt LE   Sit to Stand 5 reps   Sit to Stand Limitations cueing for controlled decent   Other Seated Lumbar Exercises 3 way flexion based cone reaches 1x4 with min guard      Lumbar Exercises: Supine   Ab Set 20 reps   AB Set Limitations 3 second holds    Bent Knee Raise 15 reps   Bent Knee Raise Limitations core set; AA required for Lt LE   Bridge 10 reps                  PT Short Term Goals - 10/11/16 1136      PT SHORT TERM GOAL #1   Title After 3 weeks patient will demonstrate improved strength AEB 5xSTS s AD in <30 seconds.    Baseline Performs 4x in 66s, UE support on legs only.    Status On-going     PT SHORT TERM GOAL #2   Title After 3 weeks pt will demonstrate improved tolerance to AMB able to sustain 6MWT at speed >0.15m/s.    Baseline Pt maintains LLE foot drop and quads weakness withotu approppriate AD at this time to sustain prolonged gait.    Status Deferred     PT SHORT TERM GOAL #3   Title After 3 weeks pt will report three ways to manage symptoms at home and demonstrate independence in a starter HEP established by PT.    Baseline Pt still limited in self management of symptoms; some HEP activities are too difficult to perform indep.    Status On-going           PT Long Term Goals - 10/11/16 1143      PT LONG TERM GOAL #1   Title After 6 weeks patient will demonstrate improved strength AEB 5xSTS s AD in <20 seconds.    Status On-going     PT LONG TERM GOAL #2   Title After 6 weeks pt will demonstrate improved tolerance to AMB able to sustain 6MWT at speed >0.41m/s.   Status On-going     PT LONG TERM GOAL #3   Title After 6 weeks pt will demonstrate independence in an advanced HEP established by PT.    Status On-going     PT LONG TERM GOAL #4   Title After 6 weeks pt will demonstrate improved strength in BLE AEB 4/5 strength in all BLE groups tested at evaluation.     Status On-going               Plan -  10/20/16 0928    Clinical Impression Statement Continued with flexion based exercises for core and proximal musculature strengthening and seated lumbar flexion stretches for pain and to reduce radicular symptoms.  Progressed to [facility] per PT for functional strengthening with ability to complete slowly with no increased pain but did report increased radicular symptoms to posterior knees.     Rehab Potential Good   PT Frequency 3x / week   PT Duration 6 weeks   PT Treatment/Interventions Therapeutic exercise;Therapeutic activities;Functional mobility training;Stair training;Gait training;Balance training;Cognitive remediation;Patient/family education;Moist Heat;Passive range of motion;Dry needling   PT Next Visit Plan Going forward minimize stretching focus to no greater than 10 minutes with an emphasis on performance at home with independence, as this should be daily maintenence at this point; continue to progress global abdominal strength, particularly in flexion; bring focus back to isolated strengthening of BLE strength deficits seen in reassessment. Eventual progression (2-3weeks out) should include more functional strengthening with transfers and gait as appropriate.     PT Home Exercise Plan New HEP:  seated marching, seated ABD with theraband; seated lower back stretch; supine LTR; supine bent knee raise; supine LAQs      Patient will benefit from skilled therapeutic intervention in order to improve the following deficits and impairments:  Abnormal gait, Impaired sensation, Improper body mechanics, Pain, Decreased activity tolerance, Difficulty walking, Decreased strength, Obesity  Visit Diagnosis: Pain in right hip  Chronic midline low back pain with left-sided sciatica  Difficulty in walking, not elsewhere classified     Problem List Patient Active Problem List   Diagnosis Date Noted  . PMR (polymyalgia rheumatica) (HCC) 09/27/2016  .  DDD (degenerative disc disease), lumbar 09/27/2016  . HTN (hypertension) 06/05/2016  . Chronic pain syndrome 06/05/2016  . Acute diverticulitis 06/02/2016  . Diverticulitis large intestine 06/02/2016  . Spinal stenosis of lumbar region 02/29/2016  . Joint pain 02/29/2016  . Endometrial polyp 02/06/2014  . Postmenopausal bleeding 01/30/2014  . GERD (gastroesophageal reflux disease) 09/29/2013  . Spinal stenosis, thoracic 09/29/2013  . Shingles 09/29/2013  . Thoracic or lumbosacral neuritis or radiculitis, unspecified 05/04/2011  . Abnormality of gait 05/04/2011  . Muscle weakness (generalized) 05/04/2011  . KNEE, ARTHRITIS, DEGEN./OSTEO 04/07/2009  . KNEE PAIN 04/07/2009   Ihor [place], [place]; [place]  Aldona Lento 10/20/2016, 9:47 AM  [place], [place], [address] Phone: [phone]   Fax:  [phone]  Name: Nicole [name] MRN: [id number] Date of Birth: February 10, 1946

## 2016-10-24 ENCOUNTER — Ambulatory Visit (HOSPITAL_COMMUNITY): Payer: Medicare Other | Attending: Rheumatology | Admitting: Physical Therapy

## 2016-10-24 ENCOUNTER — Ambulatory Visit (HOSPITAL_COMMUNITY): Payer: Medicare Other | Admitting: Physical Therapy

## 2016-10-24 ENCOUNTER — Telehealth (HOSPITAL_COMMUNITY): Payer: Self-pay | Admitting: Physical Therapy

## 2016-10-24 DIAGNOSIS — M5442 Lumbago with sciatica, left side: Secondary | ICD-10-CM | POA: Diagnosis not present

## 2016-10-24 DIAGNOSIS — M25551 Pain in right hip: Secondary | ICD-10-CM | POA: Diagnosis not present

## 2016-10-24 DIAGNOSIS — G8929 Other chronic pain: Secondary | ICD-10-CM | POA: Insufficient documentation

## 2016-10-24 DIAGNOSIS — R262 Difficulty in walking, not elsewhere classified: Secondary | ICD-10-CM | POA: Diagnosis not present

## 2016-10-24 NOTE — Telephone Encounter (Signed)
I have a hard time getting out in this cold weather but will come it we get a cx-lation this afternoon. NF

## 2016-10-24 NOTE — Therapy (Signed)
[place], [place], [address] Phone: [phone]   Fax:  [phone]  Physical Therapy Treatment  Patient Details  Name: Nicole [name] MRN: [id number] Date of Birth: 1945-11-25 Referring Provider: Cy Blamer  Encounter Date: 10/24/2016      PT End of Session - 10/24/16 1731    Visit Number 12   Number of Visits 18   Date for PT Re-Evaluation 10/30/16   Authorization Type Medicare (G-codes done 8th session)   Authorization Time Period 09/20/16- 11/01/16   Authorization - Visit Number 12   Authorization - Number of Visits 18   PT Start Time 1600   PT Stop Time 1640   PT Time Calculation (min) 40 min   Activity Tolerance Patient tolerated treatment well   Behavior During Therapy [facility] for tasks assessed/performed      Past Medical History:  Diagnosis Date  . AC (acromioclavicular) joint bone spurs    lt shoulder  . Anemia   . Arthritis   . Carpal tunnel syndrome, bilateral   . Gastroesophageal reflux   . Headache    recent visit to ER @ [facility] for severe headache  . Hypertension   . Lumbar stenosis    Hx of ESIs by Dr. Nelva Bush  . Polyarthralgia   . Polymyalgia ([place])   . Shingles   . Spinal stenosis     Past Surgical History:  Procedure Laterality Date  . CHOLECYSTECTOMY    . COLONOSCOPY  06/11/2012   Procedure: COLONOSCOPY;  Surgeon: Jamesetta So, MD;  [facility];  Service: Gastroenterology;  Laterality: N/A;  . HYSTEROSCOPY W/D&C N/A 02/25/2014   Procedure: DILATATION AND CURETTAGE /HYSTEROSCOPY;  Surgeon: Florian Buff, MD;  Location: [facility];  Service: Gynecology;  Laterality: N/A;  . POLYPECTOMY N/A 02/25/2014   Procedure: POLYPECTOMY;  Surgeon: Florian Buff, MD;  Location: [facility];  Service: Gynecology;  Laterality: N/A;  . RESECTION DISTAL CLAVICAL Right 03/26/2015   Procedure: OPEN DISTAL CLAVICAL RESECTION ;  Surgeon: Netta Cedars, MD;  Location: [place];  Service: Orthopedics;   Laterality: Right;    There were no vitals filed for this visit.      Subjective Assessment - 10/24/16 1604    Subjective Patient arrives today stating she felt OK after today's session; she is excited that she can now do hip flexion in sitting but still cannot do it in supine. No falls since last time, she reports taht radicular symptoms are only in her butt.    Pertinent History Pt had PT at this location for a related problem in 2012, with good resolution inititally, but January 2017 came back and gradually getting worse. Spinal stenosis diagnosed. Pt reports her weight has been stable for most of her adult years. She has needed a RW since ~March due to progressive weakness, previosuly SPC intermittently for years. Foot drop reported to become more noticeable this year also.    Patient Stated Goals Return to walking and being more independent with ADL, meals. About 2 years, pt had no limitations walking for prolonged.    Currently in Pain? Yes   Pain Score 5    Pain Location Back   Pain Orientation Lower   Pain Descriptors / Indicators Discomfort;[facility] Adult PT Treatment/Exercise - 10/24/16 0001      Lumbar Exercises:  Stretches   Single Knee to Chest Stretch 5 reps   Single Knee to Chest Stretch Limitations 5 seconds    Lower Trunk Rotation 5 reps   Lower Trunk Rotation Limitations 5 seconds    Prone Mid Back Stretch 5 reps   Prone Mid Back Stretch Limitations 5 seconds; seated with UEs on rollator, mad cat style      Lumbar Exercises: Seated   Hip Flexion on Ball 10 reps   Hip Flexion on Ball Limitations seated marching    Sit to Stand 5 reps   Sit to Stand Limitations min guard   Other Seated Lumbar Exercises 3 way flexion based cone reaches 1x6 with min guard; hamstring curls with red TB x5 each LE      Lumbar Exercises: Prone   Other Prone Lumbar Exercises modified quadruped in standing/lumbar flexion at mat table: PWR reach  and PWR twists 4x3 each with cones to tolerance                 PT Education - 10/24/16 1731    Education provided Yes   Education Details possible DOMS    Person(s) Educated Patient   Methods Explanation   Comprehension Verbalized understanding          PT Short Term Goals - 10/11/16 1136      PT SHORT TERM GOAL #1   Title After 3 weeks patient will demonstrate improved strength AEB 5xSTS s AD in <30 seconds.    Baseline Performs 4x in 66s, UE support on legs only.    Status On-going     PT SHORT TERM GOAL #2   Title After 3 weeks pt will demonstrate improved tolerance to AMB able to sustain 6MWT at speed >0.68m/s.    Baseline Pt maintains LLE foot drop and quads weakness withotu approppriate AD at this time to sustain prolonged gait.    Status Deferred     PT SHORT TERM GOAL #3   Title After 3 weeks pt will report three ways to manage symptoms at home and demonstrate independence in a starter HEP established by PT.    Baseline Pt still limited in self management of symptoms; some HEP activities are too difficult to perform indep.    Status On-going           PT Long Term Goals - 10/11/16 1143      PT LONG TERM GOAL #1   Title After 6 weeks patient will demonstrate improved strength AEB 5xSTS s AD in <20 seconds.    Status On-going     PT LONG TERM GOAL #2   Title After 6 weeks pt will demonstrate improved tolerance to AMB able to sustain 6MWT at speed >0.4m/s.   Status On-going     PT LONG TERM GOAL #3   Title After 6 weeks pt will demonstrate independence in an advanced HEP established by PT.    Status On-going     PT LONG TERM GOAL #4   Title After 6 weeks pt will demonstrate improved strength in BLE AEB 4/5 strength in all BLE groups tested at evaluation.    Status On-going               Plan - 10/24/16 1735    Clinical Impression Statement Progressed exercises today with increased focus on sitting, modified quadruped, and standing  activities today; patient fatigued but appeared to tolerate progression of exercise well today with no radicular symptoms, pain appearing to remain in central low  back. Patient was however fatigued and did require alternating of more and less challenging activities. Finished session with supine stretches to maintain low pain levels.    Rehab Potential Good   PT Frequency 3x / week   PT Duration 6 weeks   PT Treatment/Interventions Therapeutic exercise;Therapeutic activities;Functional mobility training;Stair training;Gait training;Balance training;Cognitive remediation;Patient/family education;Moist Heat;Passive range of motion;Dry needling   PT Next Visit Plan Going forward minimize stretching focus to no greater than 10 minutes with an emphasis on performance at home with independence, as this should be daily maintenence at this point; continue to progress global abdominal strength, particularly in flexion; bring focus back to isolated strengthening of BLE strength deficits seen in reassessment. Eventual progression (2-3weeks out) should include more functional strengthening with transfers and gait as appropriate.     PT Home Exercise Plan New HEP:  seated marching, seated ABD with theraband; seated lower back stretch; supine LTR; supine bent knee raise; supine LAQs   Consulted and Agree with Plan of Care Patient      Patient will benefit from skilled therapeutic intervention in order to improve the following deficits and impairments:  Abnormal gait, Impaired sensation, Improper body mechanics, Pain, Decreased activity tolerance, Difficulty walking, Decreased strength, Obesity  Visit Diagnosis: Pain in right hip  Chronic midline low back pain with left-sided sciatica  Difficulty in walking, not elsewhere classified     Problem List Patient Active Problem List   Diagnosis Date Noted  . PMR (polymyalgia rheumatica) (HCC) 09/27/2016  . DDD (degenerative disc disease), lumbar 09/27/2016  .  HTN (hypertension) 06/05/2016  . Chronic pain syndrome 06/05/2016  . Acute diverticulitis 06/02/2016  . Diverticulitis large intestine 06/02/2016  . Spinal stenosis of lumbar region 02/29/2016  . Joint pain 02/29/2016  . Endometrial polyp 02/06/2014  . Postmenopausal bleeding 01/30/2014  . GERD (gastroesophageal reflux disease) 09/29/2013  . Spinal stenosis, thoracic 09/29/2013  . Shingles 09/29/2013  . Thoracic or lumbosacral neuritis or radiculitis, unspecified 05/04/2011  . Abnormality of gait 05/04/2011  . Muscle weakness (generalized) 05/04/2011  . KNEE, ARTHRITIS, DEGEN./OSTEO 04/07/2009  . KNEE PAIN 04/07/2009    Deniece Ree PT, DPT [place] [address] [place], [place], [address] Phone: [phone]   Fax:  [phone]  Name: Shatisha Falter MRN: [id number] Date of Birth: Jul 17, 1946

## 2016-10-25 ENCOUNTER — Ambulatory Visit (HOSPITAL_COMMUNITY): Payer: Medicare Other

## 2016-10-25 DIAGNOSIS — G8929 Other chronic pain: Secondary | ICD-10-CM | POA: Diagnosis not present

## 2016-10-25 DIAGNOSIS — M25551 Pain in right hip: Secondary | ICD-10-CM

## 2016-10-25 DIAGNOSIS — R262 Difficulty in walking, not elsewhere classified: Secondary | ICD-10-CM

## 2016-10-25 DIAGNOSIS — M5442 Lumbago with sciatica, left side: Secondary | ICD-10-CM

## 2016-10-25 NOTE — Therapy (Signed)
[place], [place], [address] Phone: [phone]   Fax:  [phone]  Physical Therapy Treatment  Patient Details  Name: Nicole [name] MRN: [id number] Date of Birth: [birth year] Referring Provider: Cy Blamer  Encounter Date: 10/25/2016      PT End of Session - 10/25/16 1107    Visit Number 13   Number of Visits 18   Date for PT Re-Evaluation 10/30/16   Authorization Type Medicare (G-codes done 8th session)   Authorization Time Period 09/20/16- 11/01/16   Authorization - Visit Number 13   Authorization - Number of Visits 18   PT Start Time 5597  Arrived late   PT Stop Time 1115   PT Time Calculation (min) 33 min   Equipment Utilized During Treatment Gait belt   Activity Tolerance Patient tolerated treatment well;No increased pain;Patient limited by fatigue   Behavior During Therapy [facility] for tasks assessed/performed      Past Medical History:  Diagnosis Date  . AC (acromioclavicular) joint bone spurs    lt shoulder  . Anemia   . Arthritis   . Carpal tunnel syndrome, bilateral   . Gastroesophageal reflux   . Headache    recent visit to ER @ [facility] for severe headache  . Hypertension   . Lumbar stenosis    Hx of ESIs by Dr. Nelva Bush  . Polyarthralgia   . Polymyalgia ([place])   . Shingles   . Spinal stenosis     Past Surgical History:  Procedure Laterality Date  . CHOLECYSTECTOMY    . COLONOSCOPY  06/11/2012   Procedure: COLONOSCOPY;  Surgeon: Jamesetta So, MD;  [facility];  Service: Gastroenterology;  Laterality: N/A;  . HYSTEROSCOPY W/D&C N/A 02/25/2014   Procedure: DILATATION AND CURETTAGE /HYSTEROSCOPY;  Surgeon: Florian Buff, MD;  Location: [facility];  Service: Gynecology;  Laterality: N/A;  . POLYPECTOMY N/A 02/25/2014   Procedure: POLYPECTOMY;  Surgeon: Florian Buff, MD;  Location: [facility];  Service: Gynecology;  Laterality: N/A;  . RESECTION DISTAL CLAVICAL Right 03/26/2015   Procedure: OPEN DISTAL CLAVICAL RESECTION ;  Surgeon: Netta Cedars, MD;  Location: [place];  Service: Orthopedics;  Laterality: Right;    There were no vitals filed for this visit.      Subjective Assessment - 10/25/16 1048    Subjective Pt is doing well today. She says HEP isgoing well, but she has been busy with family visiting for the holiday. She reports her pain in back adn legs remains somewhat improved.    Pertinent History Pt had PT at this location for a related problem in 2012, with good resolution inititally, but January 2017 came back and gradually getting worse. Spinal stenosis diagnosed. Pt reports her weight has been stable for most of her adult years. She has needed a RW since ~March due to progressive weakness, previosuly SPC intermittently for years. Foot drop reported to become more noticeable this year also.    Limitations Standing;Walking   How long can you sit comfortably? Not limited   Currently in Pain? Yes   Pain Score 3    Pain Location --  legs, 5/10 in low back                          [facility] Adult PT Treatment/Exercise - 10/25/16 0001      Lumbar Exercises: Stretches   Lower Trunk Rotation 3 reps;30 seconds  chair rotation stretch: Lt  rotation only.      Lumbar Exercises: Seated   Hip Flexion on Ball 10 reps  2x10    Hip Flexion on Ball Limitations seated marching    Sit to Stand --  4x5, intermittent UE assist     Lumbar Exercises: Supine   Bent Knee Raise 10 reps  2x10 bilat   Straight Leg Raise 10 reps                PT Education - 10/24/16 1731    Education provided Yes   Education Details possible DOMS    Person(s) Educated Patient   Methods Explanation   Comprehension Verbalized understanding          PT Short Term Goals - 10/11/16 1136      PT SHORT TERM GOAL #1   Title After 3 weeks patient will demonstrate improved strength AEB 5xSTS s AD in <30 seconds.    Baseline Performs 4x in 66s, UE support on legs  only.    Status On-going     PT SHORT TERM GOAL #2   Title After 3 weeks pt will demonstrate improved tolerance to AMB able to sustain 6MWT at speed >0.50m/s.    Baseline Pt maintains LLE foot drop and quads weakness withotu approppriate AD at this time to sustain prolonged gait.    Status Deferred     PT SHORT TERM GOAL #3   Title After 3 weeks pt will report three ways to manage symptoms at home and demonstrate independence in a starter HEP established by PT.    Baseline Pt still limited in self management of symptoms; some HEP activities are too difficult to perform indep.    Status On-going           PT Long Term Goals - 10/11/16 1143      PT LONG TERM GOAL #1   Title After 6 weeks patient will demonstrate improved strength AEB 5xSTS s AD in <20 seconds.    Status On-going     PT LONG TERM GOAL #2   Title After 6 weeks pt will demonstrate improved tolerance to AMB able to sustain 6MWT at speed >0.5m/s.   Status On-going     PT LONG TERM GOAL #3   Title After 6 weeks pt will demonstrate independence in an advanced HEP established by PT.    Status On-going     PT LONG TERM GOAL #4   Title After 6 weeks pt will demonstrate improved strength in BLE AEB 4/5 strength in all BLE groups tested at evaluation.    Status On-going               Plan - 10/25/16 1110    Clinical Impression Statement Pt continues to make gradual progress toward all goals. Session continues to focus on improving core strength in a neutral spine posture and functional strengthening for mobility and transfers. Pt continunes to demonstrate limited onctrol of trunk while seated, and in standing with some falls anxiety.  AROM of Left hip flexion remains very limited and weak, but is showing signs of imporvement.    Rehab Potential Good   PT Frequency 3x / week   PT Duration 6 weeks   PT Treatment/Interventions Therapeutic exercise;Therapeutic activities;Functional mobility training;Stair  training;Gait training;Balance training;Cognitive remediation;Patient/family education;Moist Heat;Passive range of motion;Dry needling   PT Next Visit Plan Going forward minimize stretching focus to no greater than 10 minutes with an emphasis on performance at home with independence, as this should be  daily maintenence at this point; continue to progress global abdominal strength, particularly in flexion; bring focus back to isolated strengthening of BLE strength deficits seen in reassessment. Eventual progression (2-3weeks out) should include more functional strengthening with transfers and gait as appropriate.     PT Home Exercise Plan New HEP:  seated marching, seated ABD with theraband; seated lower back stretch; supine LTR; supine bent knee raise; supine LAQs; Updated on 1/3 to add in seated chair rotation to Left stretch, as well as red TB assist for seated hip flexion.    Consulted and Agree with Plan of Care Patient      Patient will benefit from skilled therapeutic intervention in order to improve the following deficits and impairments:  Abnormal gait, Impaired sensation, Improper body mechanics, Pain, Decreased activity tolerance, Difficulty walking, Decreased strength, Obesity  Visit Diagnosis: Pain in right hip  Chronic midline low back pain with left-sided sciatica  Difficulty in walking, not elsewhere classified     Problem List Patient Active Problem List   Diagnosis Date Noted  . PMR (polymyalgia rheumatica) (HCC) 09/27/2016  . DDD (degenerative disc disease), lumbar 09/27/2016  . HTN (hypertension) 06/05/2016  . Chronic pain syndrome 06/05/2016  . Acute diverticulitis 06/02/2016  . Diverticulitis large intestine 06/02/2016  . Spinal stenosis of lumbar region 02/29/2016  . Joint pain 02/29/2016  . Endometrial polyp 02/06/2014  . Postmenopausal bleeding 01/30/2014  . GERD (gastroesophageal reflux disease) 09/29/2013  . Spinal stenosis, thoracic 09/29/2013  . Shingles  09/29/2013  . Thoracic or lumbosacral neuritis or radiculitis, unspecified 05/04/2011  . Abnormality of gait 05/04/2011  . Muscle weakness (generalized) 05/04/2011  . KNEE, ARTHRITIS, DEGEN./OSTEO 04/07/2009  . KNEE PAIN 04/07/2009    11:25 AM, 10/25/16 [name], PT, DPT Physical Therapist at [facility] [phone] (office)     [place] [address] [place], [place], [address] Phone: [phone]   Fax:  [phone]  Name: Leonie Amacher MRN: [id number] Date of Birth: January 09, 1946

## 2016-10-27 ENCOUNTER — Ambulatory Visit (HOSPITAL_COMMUNITY): Payer: Medicare Other

## 2016-10-27 DIAGNOSIS — G8929 Other chronic pain: Secondary | ICD-10-CM

## 2016-10-27 DIAGNOSIS — M5442 Lumbago with sciatica, left side: Secondary | ICD-10-CM | POA: Diagnosis not present

## 2016-10-27 DIAGNOSIS — R262 Difficulty in walking, not elsewhere classified: Secondary | ICD-10-CM

## 2016-10-27 DIAGNOSIS — M25551 Pain in right hip: Secondary | ICD-10-CM

## 2016-10-27 NOTE — Therapy (Signed)
[place], [place], [address] Phone: [phone]   Fax:  [phone]  Physical Therapy Treatment  Patient Details  Name: Nicole [name] MRN: [id number] Date of Birth: Jan 15, 1946 Referring Provider: Cy Blamer  Encounter Date: 10/27/2016      PT End of Session - 10/27/16 1130    Visit Number 14   Number of Visits 18   Date for PT Re-Evaluation 10/30/16   Authorization Type Medicare (G-codes done 8th session)   Authorization Time Period 09/20/16- 11/01/16   Authorization - Visit Number 14   Authorization - Number of Visits 18   PT Start Time 1031   PT Stop Time 1118   PT Time Calculation (min) 47 min   Activity Tolerance Patient tolerated treatment well;Patient limited by fatigue;Patient limited by pain   Behavior During Therapy [facility] for tasks assessed/performed      Past Medical History:  Diagnosis Date  . AC (acromioclavicular) joint bone spurs    lt shoulder  . Anemia   . Arthritis   . Carpal tunnel syndrome, bilateral   . Gastroesophageal reflux   . Headache    recent visit to ER @ [facility] for severe headache  . Hypertension   . Lumbar stenosis    Hx of ESIs by Dr. Nelva Bush  . Polyarthralgia   . Polymyalgia ([place])   . Shingles   . Spinal stenosis     Past Surgical History:  Procedure Laterality Date  . CHOLECYSTECTOMY    . COLONOSCOPY  06/11/2012   Procedure: COLONOSCOPY;  Surgeon: Jamesetta So, MD;  [facility];  Service: Gastroenterology;  Laterality: N/A;  . HYSTEROSCOPY W/D&C N/A 02/25/2014   Procedure: DILATATION AND CURETTAGE /HYSTEROSCOPY;  Surgeon: Florian Buff, MD;  Location: [facility];  Service: Gynecology;  Laterality: N/A;  . POLYPECTOMY N/A 02/25/2014   Procedure: POLYPECTOMY;  Surgeon: Florian Buff, MD;  Location: [facility];  Service: Gynecology;  Laterality: N/A;  . RESECTION DISTAL CLAVICAL Right 03/26/2015   Procedure: OPEN DISTAL CLAVICAL RESECTION ;  Surgeon: Netta Cedars, MD;  Location: [place];  Service: Orthopedics;  Laterality: Right;    There were no vitals filed for this visit.      Subjective Assessment - 10/27/16 1054    Subjective Pt is doing well. She reports that she was somewhat sore after the last session, but not as bed until the morning after, Her pain is improved today in the back and hip.    Pertinent History Pt had PT at this location for a related problem in 2012, with good resolution inititally, but January 2017 came back and gradually getting worse. Spinal stenosis diagnosed. Pt reports her weight has been stable for most of her adult years. She has needed a RW since ~March due to progressive weakness, previosuly SPC intermittently for years. Foot drop reported to become more noticeable this year also.    Currently in Pain? Yes   Pain Score 3    Pain Location --  lumbar spine and hip (Lt)                          [facility] Adult PT Treatment/Exercise - 10/27/16 0001      Lumbar Exercises: Stretches   Double Knee to Chest Stretch --  seated flexion stretch in chair; 3x30 seconds   Double Knee to Chest Stretch Limitations not tolerated well; needs to be DC'd or modifie.d  Lower Trunk Rotation 5 reps;20 seconds  chair rotation stretch: Lt rotation only.      Lumbar Exercises: Standing   Heel Raises 10 reps  2x10 Left side     Lumbar Exercises: Seated   Hip Flexion on Ball Limitations Left Leg, 2x10 with redTB assist    Sit to Stand Other (comment)  2x7 from 23" height     Lumbar Exercises: Supine   Bent Knee Raise 10 reps  2x10 Left; RLE supported in neutral    Bridge 10 reps  2x10    Straight Leg Raise 10 reps  2x10 Left; RLE supported in neutral                   PT Short Term Goals - 10/11/16 1136      PT SHORT TERM GOAL #1   Title After 3 weeks patient will demonstrate improved strength AEB 5xSTS s AD in <30 seconds.    Baseline Performs 4x in 66s, UE support on legs only.    Status  On-going     PT SHORT TERM GOAL #2   Title After 3 weeks pt will demonstrate improved tolerance to AMB able to sustain 6MWT at speed >0.45m/s.    Baseline Pt maintains LLE foot drop and quads weakness withotu approppriate AD at this time to sustain prolonged gait.    Status Deferred     PT SHORT TERM GOAL #3   Title After 3 weeks pt will report three ways to manage symptoms at home and demonstrate independence in a starter HEP established by PT.    Baseline Pt still limited in self management of symptoms; some HEP activities are too difficult to perform indep.    Status On-going           PT Long Term Goals - 10/11/16 1143      PT LONG TERM GOAL #1   Title After 6 weeks patient will demonstrate improved strength AEB 5xSTS s AD in <20 seconds.    Status On-going     PT LONG TERM GOAL #2   Title After 6 weeks pt will demonstrate improved tolerance to AMB able to sustain 6MWT at speed >0.85m/s.   Status On-going     PT LONG TERM GOAL #3   Title After 6 weeks pt will demonstrate independence in an advanced HEP established by PT.    Status On-going     PT LONG TERM GOAL #4   Title After 6 weeks pt will demonstrate improved strength in BLE AEB 4/5 strength in all BLE groups tested at evaluation.    Status On-going               Plan - 10/27/16 1131    Clinical Impression Statement Pt reports to have some improvement in her pain in general, but durign session, presents with a greater degree of instability in the spine with audible popping sounds and sharp sudden radicular signs. Care is taken to modifiy exercises and find postures that maintain a neural static lumbar spine posture, and allow for smoother,ain free movements movements. LLE continues to be very weak adn 'heavy' feeling to the patient, requiring min-max assist for many exercises, however quads appears to improve in activtation throughout session. Left drop foot remains very limiting and discussed took place with  patient and caregiver regarding AFO fitting in the future and how it will affect funciton overall.    Rehab Potential Fair   PT Frequency 3x / week   PT  Duration 6 weeks   PT Treatment/Interventions Therapeutic exercise;Therapeutic activities;Functional mobility training;Stair training;Gait training;Balance training;Cognitive remediation;Patient/family education;Moist Heat;Passive range of motion;Dry needling   PT Next Visit Plan Consolidate HEP for Son to disolve confusion, and follow up on AFO order. Going forward minimize stretching focus to no greater than 10 minutes with an emphasis on performance at home with independence, as this should be daily maintenence at this point; continue to progress global abdominal strength, particularly in flexion; bring focus back to isolated strengthening of BLE strength deficits seen in reassessment. Eventual progression (2-3weeks out) should include more functional strengthening with transfers and gait as appropriate.     PT Home Exercise Plan New HEP:  seated marching, seated ABD with theraband; seated lower back stretch; supine LTR; supine bent knee raise; supine LAQs; Updated on 1/3 to add in seated chair rotation to Left stretch, as well as red TB assist for seated hip flexion.    Consulted and Agree with Plan of Care Patient      Patient will benefit from skilled therapeutic intervention in order to improve the following deficits and impairments:  Abnormal gait, Impaired sensation, Improper body mechanics, Pain, Decreased activity tolerance, Difficulty walking, Decreased strength, Obesity  Visit Diagnosis: Pain in right hip  Chronic midline low back pain with left-sided sciatica  Difficulty in walking, not elsewhere classified     Problem List Patient Active Problem List   Diagnosis Date Noted  . PMR (polymyalgia rheumatica) (HCC) 09/27/2016  . DDD (degenerative disc disease), lumbar 09/27/2016  . HTN (hypertension) 06/05/2016  . Chronic pain  syndrome 06/05/2016  . Acute diverticulitis 06/02/2016  . Diverticulitis large intestine 06/02/2016  . Spinal stenosis of lumbar region 02/29/2016  . Joint pain 02/29/2016  . Endometrial polyp 02/06/2014  . Postmenopausal bleeding 01/30/2014  . GERD (gastroesophageal reflux disease) 09/29/2013  . Spinal stenosis, thoracic 09/29/2013  . Shingles 09/29/2013  . Thoracic or lumbosacral neuritis or radiculitis, unspecified 05/04/2011  . Abnormality of gait 05/04/2011  . Muscle weakness (generalized) 05/04/2011  . KNEE, ARTHRITIS, DEGEN./OSTEO 04/07/2009  . KNEE PAIN 04/07/2009    11:37 AM, 10/27/16 [name], PT, DPT Physical Therapist at [facility] [phone] (office)     [place] [address] [place], [place], [address] Phone: [phone]   Fax:  [phone]  Name: Nicole [name] MRN: [id number] Date of Birth: 07-Jun-1946

## 2016-10-30 ENCOUNTER — Ambulatory Visit (HOSPITAL_COMMUNITY): Payer: Medicare Other

## 2016-11-01 ENCOUNTER — Ambulatory Visit (HOSPITAL_COMMUNITY): Payer: Medicare Other

## 2016-11-01 ENCOUNTER — Telehealth (HOSPITAL_COMMUNITY): Payer: Self-pay

## 2016-11-01 NOTE — Telephone Encounter (Signed)
Patient arrived on time for appointment, but grandson (with her) had an episode of bowel incontinence resultant in the emergent need to leave the building. Appointment will be canceled. Pt will return at scheduled time on Friday for reassessment visit.   10:10 AM, 11/01/16 [name], PT, DPT Physical Therapist at [place] [phone] (office)

## 2016-11-01 NOTE — Telephone Encounter (Signed)
She came in with sick grandson and then decided to go home, child was too sick to stay here.

## 2016-11-02 ENCOUNTER — Ambulatory Visit (HOSPITAL_COMMUNITY): Payer: Medicare Other | Admitting: Physical Therapy

## 2016-11-03 ENCOUNTER — Ambulatory Visit (HOSPITAL_COMMUNITY): Payer: Medicare Other

## 2016-11-03 DIAGNOSIS — M5442 Lumbago with sciatica, left side: Secondary | ICD-10-CM | POA: Diagnosis not present

## 2016-11-03 DIAGNOSIS — R262 Difficulty in walking, not elsewhere classified: Secondary | ICD-10-CM

## 2016-11-03 DIAGNOSIS — G8929 Other chronic pain: Secondary | ICD-10-CM | POA: Diagnosis not present

## 2016-11-03 DIAGNOSIS — M25551 Pain in right hip: Secondary | ICD-10-CM | POA: Diagnosis not present

## 2016-11-03 NOTE — Therapy (Signed)
[place], [place], [address] Phone: [phone]   Fax:  [phone]  Physical Therapy Reevaluation  Patient Details  Name: Nicole [name] MRN: [id number] Date of Birth: 08/22/1946 Referring Provider: Cy Blamer  Encounter Date: 11/03/2016      PT End of Session - 11/03/16 1144    Visit Number 15   Number of Visits 18   Date for PT Re-Evaluation 11/24/16   Authorization Type Medicare (G-codes done 15th session)   Authorization Time Period 09/20/16- 11/01/16; 11/03/16-12/15/16   Authorization - Visit Number 15   Authorization - Number of Visits 27   PT Start Time 1034   PT Stop Time 1120   PT Time Calculation (min) 46 min   Activity Tolerance Patient tolerated treatment well;Patient limited by fatigue;Patient limited by pain   Behavior During Therapy [facility] for tasks assessed/performed      Past Medical History:  Diagnosis Date  . AC (acromioclavicular) joint bone spurs    lt shoulder  . Anemia   . Arthritis   . Carpal tunnel syndrome, bilateral   . Gastroesophageal reflux   . Headache    recent visit to ER @ [facility] for severe headache  . Hypertension   . Lumbar stenosis    Hx of ESIs by Dr. Nelva Bush  . Polyarthralgia   . Polymyalgia ([place])   . Shingles   . Spinal stenosis     Past Surgical History:  Procedure Laterality Date  . CHOLECYSTECTOMY    . COLONOSCOPY  06/11/2012   Procedure: COLONOSCOPY;  Surgeon: Jamesetta So, MD;  [facility];  Service: Gastroenterology;  Laterality: N/A;  . HYSTEROSCOPY W/D&C N/A 02/25/2014   Procedure: DILATATION AND CURETTAGE /HYSTEROSCOPY;  Surgeon: Florian Buff, MD;  Location: [facility];  Service: Gynecology;  Laterality: N/A;  . POLYPECTOMY N/A 02/25/2014   Procedure: POLYPECTOMY;  Surgeon: Florian Buff, MD;  Location: [facility];  Service: Gynecology;  Laterality: N/A;  . RESECTION DISTAL CLAVICAL Right 03/26/2015   Procedure: OPEN DISTAL CLAVICAL RESECTION ;   Surgeon: Netta Cedars, MD;  Location: [place];  Service: Orthopedics;  Laterality: Right;    There were no vitals filed for this visit.       Subjective Assessment - 11/03/16 1040    Subjective Pt reports she feels like she has made some minimal progress overall, but does feel like things are getting better. She plans on following up with her neurosurgeon team and other MDs pertaining to this issue.    Pertinent History Pt had PT at this location for a related problem in 2012, with good resolution inititally, but January 2017 came back and gradually getting worse. Spinal stenosis diagnosed. Pt reports her weight has been stable for most of her adult years. She has needed a RW since ~March due to progressive weakness, previosuly SPC intermittently for years. Foot drop reported to become more noticeable this year also.    Limitations Standing;Walking   How long can you sit comfortably? Not limited   How long can you stand comfortably? Reported 3 minutes at evaluation; pt reports she has more ease and confidence with prolonged standing. Today pt stands for 2:50 prior to needing to sit.    How long can you walk comfortably? 54f bursts in clinic; at evaluation; pt able to AMB >115' at evaluation    Diagnostic tests Medical imaging.    Patient Stated Goals Return to walking and being more independent with ADL, meals.  About 2 years, pt had no limitations walking for prolonged.    Currently in Pain? Yes   Pain Score 2    Pain Location Back   Pain Orientation Medial   Pain Descriptors / Indicators Aching   Pain Type Chronic pain            [facility] PT Assessment - 11/03/16 0001      Assessment   Medical Diagnosis Lumbar spinal stenosis with neurogenic claudication   Referring Provider Shali Deveshwar   Onset Date/Surgical Date 10/24/15  estiamted   Prior Therapy 2012 Out patient PT, and HHPT October 2017     Precautions   Precautions None     Restrictions   Weight Bearing Restrictions  No     Balance Screen   Has the patient fallen in the past 6 months No   Has the patient had a decrease in activity level because of a fear of falling?  No   Is the patient reluctant to leave their home because of a fear of falling?  No     Home Environment   Living Environment Private residence   Living Arrangements Alone   Available Help at Discharge Family;Friend(s)  daughter and brother assist with IADL   Type of [place] to enter  2 partial steps   Entrance Stairs-Number of Steps 2   Entrance Stairs-Rails Can reach both   [place] One level   [place] - 2 wheels;Walker - 4 wheels;Cane - single point  would benefit from shower chair     Prior Function   Level of Independence Independent with household mobility with device;Needs assistance with ADLs   Toileting Supervision/set-up   Dressing Supervision/set-up   Comments Pt reports she fees ore ease and independence with these at home.   Still has some set-up, supervision as needed.      Sensation   Light Touch Impaired Detail   Light Touch Impaired Details Impaired LLE  Mild decrease in Lt L4, mod decrease in L5     Strength   Right Hip Flexion 4-/5   Right Hip External Rotation  4-/5   Right Hip Internal Rotation 4-/5   Right Hip ABduction 5/5  (seated, hips flexed to 90 degrees; was 4/5 at eval)   Left Hip External Rotation 3+/5   Left Hip Internal Rotation 4-/5   Left Hip ABduction 4+/5  (seated, hips flexed to 90 degrees; was 4/5 at eval)   Right Knee Flexion 4-/5  (3+/5 at eval)   Right Knee Extension 4/5  (3+/5 at eval)   Left Knee Flexion 3+/5  (3-/5 at eval)   Left Knee Extension 4/5  (3-/5 at eval)     Transfers   Five time sit to stand comments  19.44s  s AD     Ambulation/Gait   Ambulation Distance (Feet) 115 Feet   Gait velocity Max: 0.7ms  Self selected: 0.250m   Gait Comments DOE 5/10; HR 110bpn.                              PT Short Term Goals - 11/03/16 1158      PT SHORT TERM GOAL #1   Title After 3 weeks patient will demonstrate improved strength AEB 5xSTS s AD in <30 seconds.    Baseline Performs 4x in 66s, UE support on legs only at reassessment; at 6 weeks, performs 5xSTS in 19s.  Status Achieved     PT SHORT TERM GOAL #2   Title After 3 weeks pt will demonstrate improved tolerance to AMB able to sustain 6MWT at speed >0.30ms.    Baseline at 6 weeks maximal gait speed is 0.43m    Status Achieved     PT SHORT TERM GOAL #3   Title After 3 weeks pt will report three ways to manage symptoms at home and demonstrate independence in a starter HEP established by PT.    Baseline Pt still limited in self management of symptoms; some HEP activities are too difficult to perform indep.    Status Not Met           PT Long Term Goals - 11/03/16 1109      PT LONG TERM GOAL #1   Title After 6 weeks patient will demonstrate improved strength AEB 5xSTS s AD in <20 seconds.    Baseline 19.4sec s AD    Status Achieved     PT LONG TERM GOAL #2   Title After 6 weeks pt will demonstrate improved tolerance to AMB able to sustain 6MWT at speed >0.6038m   Baseline Pt has increased gait speed by >400%, and AMB distance by>500%, but still unable to meet these criteria.    Status Not Met     PT LONG TERM GOAL #3   Title After 6 weeks pt will demonstrate independence in an advanced HEP established by PT.    Status Achieved     PT LONG TERM GOAL #4   Title After 6 weeks pt will demonstrate improved strength in BLE AEB 4/5 strength in all BLE groups tested at evaluation.    Baseline Noted increase in strength accross most muscle groups, some still below the 4/5 threshold.    Status Partially Met               Plan - 11/03/16 1146    Clinical Impression Statement Reassessment performed today. Pt reports less frequent muscle cramping in her leg during positional changes in bed at night. Pt reports that  she feels that her pain is more consistently controlled, and her activity tolerance to short hosuehold distance AMB has improved. Examination reveals improvments in focal strength iin BLE AEB MMT., but with sustained weakness in the LLE from hip to ankle. Functional strength is also improved now able to perform 5xSTS in 19s, whereas at evaluation she required maximal effort to come to standing with AD. Maximal gait speed has improved from 0.5m94mo 0.57m/60md self selected gait speed is ~0.80m/s38mth supstantially improved but still predictive of increased falls risk, in the home. Progressive development of HEP as well as progressive compliance have made a big difference in the patient's response to therapy. Prolonged standing continues to be very limited by lumbar spinal stenosis and claudication in WB. Pt is motivated and would like to continue with therapy to improve her function and independnece.    Rehab Potential Fair   PT Frequency 2x / week   PT Duration 6 weeks   PT Treatment/Interventions Therapeutic exercise;Therapeutic activities;Functional mobility training;Stair training;Gait training;Balance training;Cognitive remediation;Patient/family education;Moist Heat;Passive range of motion;Dry needling;Energy conservation;Electrical Stimulation;Aquatic Therapy;Manual techniques;ADLs/Self Care Home Management;DME Instruction;Neuromuscular re-education   PT Next Visit Plan Review HEP in full as needed. Minimal emphasis on stretching in session. Continue with isolated strengthening (active or PT assisted) of lower extemity, particularly L hip flexion and Left knee extension, as well as abdominal strength for stabilization. Avoid lumbar extension due to stenosis  and radicular signs. Trial on Nustep before or after session. Ask abotu joing the [employer] to participate in aquatic therapy and or having access to a recumbent stepper.    PT Home Exercise Plan New HEP:  seated marching, seated ABD with theraband;  seated lower back stretch; supine LTR; supine bent knee raise; supine LAQs; Updated on 1/3 to add in seated chair rotation to Left stretch, as well as red TB assist for seated hip flexion.    Consulted and Agree with Plan of Care Patient      Patient will benefit from skilled therapeutic intervention in order to improve the following deficits and impairments:  Abnormal gait, Impaired sensation, Improper body mechanics, Pain, Decreased activity tolerance, Difficulty walking, Decreased strength, Obesity  Visit Diagnosis: Pain in right hip  Chronic midline low back pain with left-sided sciatica  Difficulty in walking, not elsewhere classified      G-Codes - 2016/11/18 1129    Functional Assessment Tool Used Clinical judgment   Functional Limitation Mobility: Walking and moving around   Mobility: Walking and Moving Around Current Status [phone]) At least 60 percent but less than 80 percent impaired, limited or restricted   Mobility: Walking and Moving Around Goal Status [phone]) At least 40 percent but less than 60 percent impaired, limited or restricted       Problem List Patient Active Problem List   Diagnosis Date Noted  . PMR (polymyalgia rheumatica) (HCC) 09/27/2016  . DDD (degenerative disc disease), lumbar 09/27/2016  . HTN (hypertension) 06/05/2016  . Chronic pain syndrome 06/05/2016  . Acute diverticulitis 06/02/2016  . Diverticulitis large intestine 06/02/2016  . Spinal stenosis of lumbar region 02/29/2016  . Joint pain 02/29/2016  . Endometrial polyp 02/06/2014  . Postmenopausal bleeding 01/30/2014  . GERD (gastroesophageal reflux disease) 09/29/2013  . Spinal stenosis, thoracic 09/29/2013  . Shingles 09/29/2013  . Thoracic or lumbosacral neuritis or radiculitis, unspecified 05/04/2011  . Abnormality of gait 05/04/2011  . Muscle weakness (generalized) 05/04/2011  . KNEE, ARTHRITIS, DEGEN./OSTEO 04/07/2009  . KNEE PAIN 04/07/2009    12:12 PM, 18-Nov-2016 [name], PT, DPT Physical Therapist at [place] [phone] (office)     [place] [address] [place], [place], [address] Phone: [phone]   Fax:  [phone]  Name: Nicole [name] MRN: [id number] Date of Birth: 1946/02/01

## 2016-11-08 ENCOUNTER — Ambulatory Visit (HOSPITAL_COMMUNITY): Payer: Medicare Other | Admitting: Physical Therapy

## 2016-11-10 ENCOUNTER — Telehealth (HOSPITAL_COMMUNITY): Payer: Self-pay | Admitting: Family Medicine

## 2016-11-10 ENCOUNTER — Ambulatory Visit (HOSPITAL_COMMUNITY): Payer: Medicare Other

## 2016-11-10 NOTE — Telephone Encounter (Signed)
11/10/16 pt left a message that she cannot walk on ice and wouldn't be here today

## 2016-11-15 ENCOUNTER — Ambulatory Visit (HOSPITAL_COMMUNITY): Payer: Medicare Other

## 2016-11-15 DIAGNOSIS — M5442 Lumbago with sciatica, left side: Secondary | ICD-10-CM | POA: Diagnosis not present

## 2016-11-15 DIAGNOSIS — M25551 Pain in right hip: Secondary | ICD-10-CM

## 2016-11-15 DIAGNOSIS — G8929 Other chronic pain: Secondary | ICD-10-CM | POA: Diagnosis not present

## 2016-11-15 DIAGNOSIS — R262 Difficulty in walking, not elsewhere classified: Secondary | ICD-10-CM | POA: Diagnosis not present

## 2016-11-15 NOTE — Therapy (Signed)
[place], [place], [address] Phone: [phone]   Fax:  [phone]  Physical Therapy Treatment  Patient Details  Name: Nicole [name] MRN: [id number] Date of Birth: December 18, 1945 Referring Provider: Cy Blamer  Encounter Date: 11/15/2016      PT End of Session - 11/15/16 1013    Visit Number 16   Number of Visits 26   Date for PT Re-Evaluation 11/24/16   Authorization Type Medicare (G-codes done 15th session)   Authorization Time Period 09/20/16- 11/01/16; 11/03/16-12/15/16   Authorization - Visit Number 16   Authorization - Number of Visits 27   PT Start Time 0943   PT Stop Time 1027   PT Time Calculation (min) 44 min   Equipment Utilized During Treatment Gait belt   Activity Tolerance Patient tolerated treatment well;Patient limited by fatigue;Patient limited by pain   Behavior During Therapy [facility] for tasks assessed/performed      Past Medical History:  Diagnosis Date  . AC (acromioclavicular) joint bone spurs    lt shoulder  . Anemia   . Arthritis   . Carpal tunnel syndrome, bilateral   . Gastroesophageal reflux   . Headache    recent visit to ER @ [facility] for severe headache  . Hypertension   . Lumbar stenosis    Hx of ESIs by Dr. Nelva Bush  . Polyarthralgia   . Polymyalgia ([place])   . Shingles   . Spinal stenosis     Past Surgical History:  Procedure Laterality Date  . CHOLECYSTECTOMY    . COLONOSCOPY  06/11/2012   Procedure: COLONOSCOPY;  Surgeon: Jamesetta So, MD;  [facility];  Service: Gastroenterology;  Laterality: N/A;  . HYSTEROSCOPY W/D&C N/A 02/25/2014   Procedure: DILATATION AND CURETTAGE /HYSTEROSCOPY;  Surgeon: Florian Buff, MD;  Location: [facility];  Service: Gynecology;  Laterality: N/A;  . POLYPECTOMY N/A 02/25/2014   Procedure: POLYPECTOMY;  Surgeon: Florian Buff, MD;  Location: [facility];  Service: Gynecology;  Laterality: N/A;  . RESECTION DISTAL CLAVICAL Right 03/26/2015    Procedure: OPEN DISTAL CLAVICAL RESECTION ;  Surgeon: Netta Cedars, MD;  Location: [place];  Service: Orthopedics;  Laterality: Right;    There were no vitals filed for this visit.      Subjective Assessment - 11/15/16 0946    Subjective Pt doing well today, still working on her HEP, which makes her sore sometimes. She has been unable to modify her chair height for sit to stands at home.    Pertinent History Pt had PT at this location for a related problem in 2012, with good resolution inititally, but January 2017 came back and gradually getting worse. Spinal stenosis diagnosed. Pt reports her weight has been stable for most of her adult years. She has needed a RW since ~March due to progressive weakness, previosuly SPC intermittently for years. Foot drop reported to become more noticeable this year also.    Currently in Pain? Yes   Pain Score 4    Pain Location Back                         [facility] Adult PT Treatment/Exercise - 11/15/16 0001      Lumbar Exercises: Stretches   Double Knee to Chest Stretch 3 reps;60 seconds  3x60sec, feet elevated on 8"step    Double Knee to Chest Stretch Limitations Good strategy to relieve stenosis pain while avoiding low back pain  Lower Trunk Rotation 3 reps;30 seconds  chair rotation stretch: Lt rotation only.    Lower Trunk Rotation Limitations reeducated on 30sec hold v 'counting to 10'     Lumbar Exercises: Seated   Long Arc Quad on Chair 4 sets;10 reps   LAQ on Chair Weights (lbs) 3lb cuff on Right, AAROM on Left 0lb   Hip Flexion on Ball 10 reps;Both;Strengthening  3x10 bilat, AAROM on Left   Other Seated Lumbar Exercises Knee flexion 3x10 bilat c theraband resistance     Lumbar Exercises: Supine   Ab Set 5 reps  4x5, abdominal crunches in hook lying   Bent Knee Raise 10 reps  3x10, legs elevated to 30 degrees to control pelvic tilt   Bent Knee Raise Limitations RAnge limited from 65-100 degrees   Straight Leg Raise 10  reps  3x10, legs elevated to 30 degrees to control pelvic tilt                  PT Short Term Goals - 11/03/16 1158      PT SHORT TERM GOAL #1   Title After 3 weeks patient will demonstrate improved strength AEB 5xSTS s AD in <30 seconds.    Baseline Performs 4x in 66s, UE support on legs only at reassessment; at 6 weeks, performs 5xSTS in 19s.    Status Achieved     PT SHORT TERM GOAL #2   Title After 3 weeks pt will demonstrate improved tolerance to AMB able to sustain 6MWT at speed >0.69ms.    Baseline at 6 weeks maximal gait speed is 0.435m    Status Achieved     PT SHORT TERM GOAL #3   Title After 3 weeks pt will report three ways to manage symptoms at home and demonstrate independence in a starter HEP established by PT.    Baseline Pt still limited in self management of symptoms; some HEP activities are too difficult to perform indep.    Status Not Met           PT Long Term Goals - 11/03/16 1109      PT LONG TERM GOAL #1   Title After 6 weeks patient will demonstrate improved strength AEB 5xSTS s AD in <20 seconds.    Baseline 19.4sec s AD    Status Achieved     PT LONG TERM GOAL #2   Title After 6 weeks pt will demonstrate improved tolerance to AMB able to sustain 6MWT at speed >0.6099m   Baseline Pt has increased gait speed by >400%, and AMB distance by>500%, but still unable to meet these criteria.    Status Not Met     PT LONG TERM GOAL #3   Title After 6 weeks pt will demonstrate independence in an advanced HEP established by PT.    Status Achieved     PT LONG TERM GOAL #4   Title After 6 weeks pt will demonstrate improved strength in BLE AEB 4/5 strength in all BLE groups tested at evaluation.    Baseline Noted increase in strength accross most muscle groups, some still below the 4/5 threshold.    Status Partially Met               Plan - 11/15/16 1017    Clinical Impression Statement Progressive isolated strengthening continues this  session, with efforts made to minimize lumbar instability throught patient positioning and posturing. Efforts made also to target severely week abdominals (trunk flexors) in a well tolerated position.  Sharp sudden low back paisn are avoidid mostly, except for during transfers. Pt c/o intermittent cramping in the Right quads during session, intermittently, but not entirely limiting. LLE continues to remain quite weak, requriing minA to perform most exercises.     Rehab Potential Fair   PT Frequency 2x / week   PT Duration 6 weeks   PT Treatment/Interventions Therapeutic exercise;Therapeutic activities;Functional mobility training;Stair training;Gait training;Balance training;Cognitive remediation;Patient/family education;Moist Heat;Passive range of motion;Dry needling;Energy conservation;Electrical Stimulation;Aquatic Therapy;Manual techniques;ADLs/Self Care Home Management;DME Instruction;Neuromuscular re-education   PT Next Visit Plan Follow up with STS exercsies and AFO. Minimal emphasis on stretching in session. Continue with isolated strengthening (active or PT assisted) of lower extemity, particularly L hip flexion and Left knee extension, as well as abdominal strength for stabilization. Avoid lumbar extension due to stenosis and radicular signs. Trial on Nustep before or after session. Ask abotu joing the [employer] to participate in aquatic therapy and or having access to a recumbent stepper.    PT Home Exercise Plan New HEP:  seated marching, seated ABD with theraband; seated lower back stretch; supine LTR; supine bent knee raise; supine LAQs; Updated on 1/3 to add in seated chair rotation to Left stretch, as well as red TB assist for seated hip flexion.    Consulted and Agree with Plan of Care Patient      Patient will benefit from skilled therapeutic intervention in order to improve the following deficits and impairments:  Abnormal gait, Impaired sensation, Improper body mechanics, Pain, Decreased  activity tolerance, Difficulty walking, Decreased strength, Obesity  Visit Diagnosis: Pain in right hip  Chronic midline low back pain with left-sided sciatica  Difficulty in walking, not elsewhere classified     Problem List Patient Active Problem List   Diagnosis Date Noted  . PMR (polymyalgia rheumatica) (HCC) 09/27/2016  . DDD (degenerative disc disease), lumbar 09/27/2016  . HTN (hypertension) 06/05/2016  . Chronic pain syndrome 06/05/2016  . Acute diverticulitis 06/02/2016  . Diverticulitis large intestine 06/02/2016  . Spinal stenosis of lumbar region 02/29/2016  . Joint pain 02/29/2016  . Endometrial polyp 02/06/2014  . Postmenopausal bleeding 01/30/2014  . GERD (gastroesophageal reflux disease) 09/29/2013  . Spinal stenosis, thoracic 09/29/2013  . Shingles 09/29/2013  . Thoracic or lumbosacral neuritis or radiculitis, unspecified 05/04/2011  . Abnormality of gait 05/04/2011  . Muscle weakness (generalized) 05/04/2011  . KNEE, ARTHRITIS, DEGEN./OSTEO 04/07/2009  . KNEE PAIN 04/07/2009   10:30 AM, 11/15/16 [name], PT, DPT Physical Therapist at [facility] [phone] (office)     [place] [address] [place], [place], [address] Phone: [phone]   Fax:  [phone]  Name: Nicole [name] MRN: [id number] Date of Birth: 06-Nov-1945

## 2016-11-17 ENCOUNTER — Ambulatory Visit (HOSPITAL_COMMUNITY): Payer: Medicare Other

## 2016-11-17 DIAGNOSIS — M25551 Pain in right hip: Secondary | ICD-10-CM

## 2016-11-17 DIAGNOSIS — R262 Difficulty in walking, not elsewhere classified: Secondary | ICD-10-CM

## 2016-11-17 DIAGNOSIS — M5442 Lumbago with sciatica, left side: Secondary | ICD-10-CM

## 2016-11-17 DIAGNOSIS — G8929 Other chronic pain: Secondary | ICD-10-CM | POA: Diagnosis not present

## 2016-11-17 NOTE — Therapy (Signed)
[place], [place], [address] Phone: [phone]   Fax:  [phone]  Physical Therapy Treatment  Patient Details  Name: Nicole [name] MRN: [id number] Date of Birth: 12/13/1945 Referring Provider: Cy Blamer  Encounter Date: 11/17/2016      PT End of Session - 11/17/16 0931    Visit Number 17   Number of Visits 26   Date for PT Re-Evaluation 11/24/16   Authorization Type Medicare (G-codes done 15th session)   Authorization Time Period 09/20/16- 11/01/16; 11/03/16-12/15/16   Authorization - Visit Number 40   Authorization - Number of Visits 27   PT Start Time 0856   PT Stop Time 0935   PT Time Calculation (min) 39 min   Activity Tolerance Patient tolerated treatment well;Patient limited by fatigue;Patient limited by pain   Behavior During Therapy [facility] for tasks assessed/performed      Past Medical History:  Diagnosis Date  . AC (acromioclavicular) joint bone spurs    lt shoulder  . Anemia   . Arthritis   . Carpal tunnel syndrome, bilateral   . Gastroesophageal reflux   . Headache    recent visit to ER @ [facility] for severe headache  . Hypertension   . Lumbar stenosis    Hx of ESIs by Dr. Nelva Bush  . Polyarthralgia   . Polymyalgia ([place])   . Shingles   . Spinal stenosis     Past Surgical History:  Procedure Laterality Date  . CHOLECYSTECTOMY    . COLONOSCOPY  06/11/2012   Procedure: COLONOSCOPY;  Surgeon: Jamesetta So, MD;  [facility];  Service: Gastroenterology;  Laterality: N/A;  . HYSTEROSCOPY W/D&C N/A 02/25/2014   Procedure: DILATATION AND CURETTAGE /HYSTEROSCOPY;  Surgeon: Florian Buff, MD;  Location: [facility];  Service: Gynecology;  Laterality: N/A;  . POLYPECTOMY N/A 02/25/2014   Procedure: POLYPECTOMY;  Surgeon: Florian Buff, MD;  Location: [facility];  Service: Gynecology;  Laterality: N/A;  . RESECTION DISTAL CLAVICAL Right 03/26/2015   Procedure: OPEN DISTAL CLAVICAL RESECTION ;   Surgeon: Netta Cedars, MD;  Location: [place];  Service: Orthopedics;  Laterality: Right;    There were no vitals filed for this visit.      Subjective Assessment - 11/17/16 0923    Subjective Pt reports she is feelign ok today, jus tsoem nagging soreness. She reports lots of muscles soreness after last session, but that has improved since then .    Pertinent History Pt had PT at this location for a related problem in 2012, with good resolution inititally, but January 2017 came back and gradually getting worse. Spinal stenosis diagnosed. Pt reports her weight has been stable for most of her adult years. She has needed a RW since ~March due to progressive weakness, previosuly SPC intermittently for years. Foot drop reported to become more noticeable this year also.    Currently in Pain? Yes   Pain Score 3    Pain Location Back   Pain Orientation Medial   Pain Descriptors / Indicators Aching                         [facility] Adult PT Treatment/Exercise - 11/17/16 0001      Lumbar Exercises: Stretches   Lower Trunk Rotation 30 seconds;4 reps  chair rotation stretch: Lt rotation only.      Lumbar Exercises: Aerobic   Stationary Bike NuStep  5 Minute trial after completion of  sesion.      Lumbar Exercises: Seated   Long Arc Quad on Chair 3 sets;10 reps;Both  3lb on Rt, no weight on left    Hip Flexion on Ball 10 reps;Both;Strengthening  3x10 bilat, AAROM on Left   Sit to Stand Limitations repeated trunk rotation to Left with blueTB: 2x15   Other Seated Lumbar Exercises Trunk flexion in cahri with Blue Tband resistance 2x15     Lumbar Exercises: Supine   Ab Set 5 reps  2x10, abdominal crunches in hook lying   Clam 15 reps  3x15 c red TB    Bent Knee Raise 10 reps  2x15, legs elevated to 30 degrees to control pelvis (45-90*)   Straight Leg Raise 10 reps  3x10; minA on Lt side                  PT Short Term Goals - 11/03/16 1158      PT SHORT TERM GOAL #1    Title After 3 weeks patient will demonstrate improved strength AEB 5xSTS s AD in <30 seconds.    Baseline Performs 4x in 66s, UE support on legs only at reassessment; at 6 weeks, performs 5xSTS in 19s.    Status Achieved     PT SHORT TERM GOAL #2   Title After 3 weeks pt will demonstrate improved tolerance to AMB able to sustain 6MWT at speed >0.75ms.    Baseline at 6 weeks maximal gait speed is 0.4104m    Status Achieved     PT SHORT TERM GOAL #3   Title After 3 weeks pt will report three ways to manage symptoms at home and demonstrate independence in a starter HEP established by PT.    Baseline Pt still limited in self management of symptoms; some HEP activities are too difficult to perform indep.    Status Not Met           PT Long Term Goals - 11/03/16 1109      PT LONG TERM GOAL #1   Title After 6 weeks patient will demonstrate improved strength AEB 5xSTS s AD in <20 seconds.    Baseline 19.4sec s AD    Status Achieved     PT LONG TERM GOAL #2   Title After 6 weeks pt will demonstrate improved tolerance to AMB able to sustain 6MWT at speed >0.6062m   Baseline Pt has increased gait speed by >400%, and AMB distance by>500%, but still unable to meet these criteria.    Status Not Met     PT LONG TERM GOAL #3   Title After 6 weeks pt will demonstrate independence in an advanced HEP established by PT.    Status Achieved     PT LONG TERM GOAL #4   Title After 6 weeks pt will demonstrate improved strength in BLE AEB 4/5 strength in all BLE groups tested at evaluation.    Baseline Noted increase in strength accross most muscle groups, some still below the 4/5 threshold.    Status Partially Met               Plan - 11/17/16 0932    Clinical Impression Statement Core strengthen continues this session with minA for weakness on LLE and posturing on table adaptive to avoid sudden lumbar instability. Addition of Theraband resisted trunk flexion/rotation is tolerated well,  no increase in pain, and with form taught.  Pt tirialed on NuStep at end of session, tolerated well, minimally fatigued but no increase  in lumbar symptoms.    Rehab Potential Fair   PT Frequency 2x / week   PT Duration 6 weeks   PT Treatment/Interventions Therapeutic exercise;Therapeutic activities;Functional mobility training;Stair training;Gait training;Balance training;Cognitive remediation;Patient/family education;Moist Heat;Passive range of motion;Dry needling;Energy conservation;Electrical Stimulation;Aquatic Therapy;Manual techniques;ADLs/Self Care Home Management;DME Instruction;Neuromuscular re-education   PT Next Visit Plan Follow up with STS exercsies and AFO. Minimal emphasis on stretching in session. Continue with isolated strengthening (active or PT assisted) of lower extemity, particularly L hip flexion and Left knee extension, as well as abdominal strength for stabilization. Avoid lumbar extension due to stenosis and radicular signs. Trial on Nustep before or after session. Ask abotu joing the [employer] to participate in aquatic therapy and or having access to a recumbent stepper.    PT Home Exercise Plan New HEP:  seated marching, seated ABD with theraband; seated lower back stretch; supine LTR; supine bent knee raise; supine LAQs; Updated on 1/3 to add in seated chair rotation to Left stretch, as well as red TB assist for seated hip flexion.    Consulted and Agree with Plan of Care Patient      Patient will benefit from skilled therapeutic intervention in order to improve the following deficits and impairments:  Abnormal gait, Impaired sensation, Improper body mechanics, Pain, Decreased activity tolerance, Difficulty walking, Decreased strength, Obesity  Visit Diagnosis: Pain in right hip  Chronic midline low back pain with left-sided sciatica  Difficulty in walking, not elsewhere classified     Problem List Patient Active Problem List   Diagnosis Date Noted  . PMR (polymyalgia  rheumatica) (HCC) 09/27/2016  . DDD (degenerative disc disease), lumbar 09/27/2016  . HTN (hypertension) 06/05/2016  . Chronic pain syndrome 06/05/2016  . Acute diverticulitis 06/02/2016  . Diverticulitis large intestine 06/02/2016  . Spinal stenosis of lumbar region 02/29/2016  . Joint pain 02/29/2016  . Endometrial polyp 02/06/2014  . Postmenopausal bleeding 01/30/2014  . GERD (gastroesophageal reflux disease) 09/29/2013  . Spinal stenosis, thoracic 09/29/2013  . Shingles 09/29/2013  . Thoracic or lumbosacral neuritis or radiculitis, unspecified 05/04/2011  . Abnormality of gait 05/04/2011  . Muscle weakness (generalized) 05/04/2011  . KNEE, ARTHRITIS, DEGEN./OSTEO 04/07/2009  . KNEE PAIN 04/07/2009    9:41 AM, 11/17/16 [name], PT, DPT Physical Therapist at [facility] [phone] (office)     [place] [address] [place], [place], [address] Phone: [phone]   Fax:  [phone]  Name: Nicole [name] MRN: [id number] Date of Birth: 22-Oct-1946

## 2016-11-22 ENCOUNTER — Ambulatory Visit (HOSPITAL_COMMUNITY): Payer: Medicare Other

## 2016-11-22 DIAGNOSIS — M5442 Lumbago with sciatica, left side: Secondary | ICD-10-CM | POA: Diagnosis not present

## 2016-11-22 DIAGNOSIS — R262 Difficulty in walking, not elsewhere classified: Secondary | ICD-10-CM | POA: Diagnosis not present

## 2016-11-22 DIAGNOSIS — G8929 Other chronic pain: Secondary | ICD-10-CM | POA: Diagnosis not present

## 2016-11-22 DIAGNOSIS — M25551 Pain in right hip: Secondary | ICD-10-CM

## 2016-11-22 NOTE — Therapy (Signed)
[place], [place], [address] Phone: [phone]   Fax:  [phone]  Physical Therapy Treatment  Patient Details  Name: Nicole [name] MRN: [id number] Date of Birth: Jan 14, 1946 Referring Provider: Cy Blamer  Encounter Date: 11/22/2016      PT End of Session - 11/22/16 1008    Visit Number 18   Number of Visits 26   Date for PT Re-Evaluation 11/24/16   Authorization Type Medicare (G-codes done 15th session)   Authorization Time Period 09/20/16- 11/01/16; 11/03/16-12/15/16   Authorization - Visit Number 18   Authorization - Number of Visits 27   PT Start Time 0947   PT Stop Time 1025   PT Time Calculation (min) 38 min   Activity Tolerance Patient tolerated treatment well;Patient limited by fatigue;Patient limited by pain   Behavior During Therapy [facility] for tasks assessed/performed      Past Medical History:  Diagnosis Date  . AC (acromioclavicular) joint bone spurs    lt shoulder  . Anemia   . Arthritis   . Carpal tunnel syndrome, bilateral   . Gastroesophageal reflux   . Headache    recent visit to ER @ [facility] for severe headache  . Hypertension   . Lumbar stenosis    Hx of ESIs by Dr. Nelva Bush  . Polyarthralgia   . Polymyalgia ([place])   . Shingles   . Spinal stenosis     Past Surgical History:  Procedure Laterality Date  . CHOLECYSTECTOMY    . COLONOSCOPY  06/11/2012   Procedure: COLONOSCOPY;  Surgeon: Jamesetta So, MD;  [facility];  Service: Gastroenterology;  Laterality: N/A;  . HYSTEROSCOPY W/D&C N/A 02/25/2014   Procedure: DILATATION AND CURETTAGE /HYSTEROSCOPY;  Surgeon: Florian Buff, MD;  Location: [facility];  Service: Gynecology;  Laterality: N/A;  . POLYPECTOMY N/A 02/25/2014   Procedure: POLYPECTOMY;  Surgeon: Florian Buff, MD;  Location: [facility];  Service: Gynecology;  Laterality: N/A;  . RESECTION DISTAL CLAVICAL Right 03/26/2015   Procedure: OPEN DISTAL CLAVICAL RESECTION ;   Surgeon: Netta Cedars, MD;  Location: [facility];  Service: Orthopedics;  Laterality: Right;    There were no vitals filed for this visit.      Subjective Assessment - 11/22/16 0952    Subjective Pt reports feeling pretty good on the day after the last treatment, but the following day, had a great deal more pain and soreness in hips and shoulders (sustpected to be related to DOMS) which has now improved. TOday her knees ar ebothering her, adn her back pain feels improved.    Pertinent History Pt had PT at this location for a related problem in 2012, with good resolution inititally, but January 2017 came back and gradually getting worse. Spinal stenosis diagnosed. Pt reports her weight has been stable for most of her adult years. She has needed a RW since ~March due to progressive weakness, previosuly SPC intermittently for years. Foot drop reported to become more noticeable this year also.    Currently in Pain? Yes   Pain Score 5    Pain Location Knee   Pain Orientation Right                         [facility] Adult PT Treatment/Exercise - 11/22/16 0001      Lumbar Exercises: Aerobic   Stationary Bike NuStep  5 Minute after completion of sesion.      Lumbar Exercises:  Seated   Long [employer] on Chair 3 sets;10 reps;Both  3lb on Rt, no weight on left    Hip Flexion on Ball 10 reps;Both;Strengthening  3x15 Rt. 3x10Lt bilat, AAROM on Left   Sit to Stand Limitations repeated trunk rotation to Left 2x15 for 3 seconds    Other Seated Lumbar Exercises Trunk flexion in chair with Blue Tband resistance 2x15     Lumbar Exercises: Supine   Ab Set 10 reps  2x10, abdominal crunches in hook lying   Clam 15 reps  2x15 c Green TB    Bent Knee Raise 10 reps   Bridge 15 reps;Compliant  2x15   Bridge Limitations comfortable range only   Straight Leg Raise 10 reps;15 reps  2x15 RT, 2x10 Lt (A/ROM); minA on Lt side   Straight Leg Raises Limitations range limited from 30-60 degrees for trunk  support                  PT Short Term Goals - 11/03/16 1158      PT SHORT TERM GOAL #1   Title After 3 weeks patient will demonstrate improved strength AEB 5xSTS s AD in <30 seconds.    Baseline Performs 4x in 66s, UE support on legs only at reassessment; at 6 weeks, performs 5xSTS in 19s.    Status Achieved     PT SHORT TERM GOAL #2   Title After 3 weeks pt will demonstrate improved tolerance to AMB able to sustain 6MWT at speed >0.4ms.    Baseline at 6 weeks maximal gait speed is 0.460m    Status Achieved     PT SHORT TERM GOAL #3   Title After 3 weeks pt will report three ways to manage symptoms at home and demonstrate independence in a starter HEP established by PT.    Baseline Pt still limited in self management of symptoms; some HEP activities are too difficult to perform indep.    Status Not Met           PT Long Term Goals - 11/03/16 1109      PT LONG TERM GOAL #1   Title After 6 weeks patient will demonstrate improved strength AEB 5xSTS s AD in <20 seconds.    Baseline 19.4sec s AD    Status Achieved     PT LONG TERM GOAL #2   Title After 6 weeks pt will demonstrate improved tolerance to AMB able to sustain 6MWT at speed >0.6057m   Baseline Pt has increased gait speed by >400%, and AMB distance by>500%, but still unable to meet these criteria.    Status Not Met     PT LONG TERM GOAL #3   Title After 6 weeks pt will demonstrate independence in an advanced HEP established by PT.    Status Achieved     PT LONG TERM GOAL #4   Title After 6 weeks pt will demonstrate improved strength in BLE AEB 4/5 strength in all BLE groups tested at evaluation.    Baseline Noted increase in strength accross most muscle groups, some still below the 4/5 threshold.    Status Partially Met               Plan - 11/22/16 1009    Clinical Impression Statement Pt tolerating treatment session today very well, only mild soreness from DOMS now workign itself out  mostly. The patient is noted to have signtficantly improved activiation/A/ROM on the LLE in hip flexion and LAQ on the  left, whereas she prevously required assistance to perform AA/ROM. No changes to POC at this time. Continue to focus on strengtheing of trunk flexors, abdominal and hips as tolerated.  Pt has still not set up her appointment with the orthotisis due to financial retricitons.    PT Frequency 2x / week   PT Duration 6 weeks   PT Treatment/Interventions Therapeutic exercise;Therapeutic activities;Functional mobility training;Stair training;Gait training;Balance training;Cognitive remediation;Patient/family education;Moist Heat;Passive range of motion;Dry needling;Energy conservation;Electrical Stimulation;Aquatic Therapy;Manual techniques;ADLs/Self Care Home Management;DME Instruction;Neuromuscular re-education   PT Next Visit Plan Cont to follow up with STS exercsies and AFO. Minimal emphasis on stretching in session. Continue with isolated strengthening (active or PT assisted) of lower extemity, particularly L hip flexion and Left knee extension, as well as abdominal strength for stabilization. Avoid lumbar extension due to stenosis and radicular signs. Trial on Nustep before or after session. Ask abotu joing the [employer] to participate in aquatic therapy and or having access to a recumbent stepper.    PT Home Exercise Plan New HEP:  seated marching, seated ABD with theraband; seated lower back stretch; supine LTR; supine bent knee raise; supine LAQs; Updated on 1/3 to add in seated chair rotation to Left stretch, as well as red TB assist for seated hip flexion.    Consulted and Agree with Plan of Care Patient      Patient will benefit from skilled therapeutic intervention in order to improve the following deficits and impairments:  Abnormal gait, Impaired sensation, Improper body mechanics, Pain, Decreased activity tolerance, Difficulty walking, Decreased strength, Obesity  Visit  Diagnosis: Pain in right hip  Chronic midline low back pain with left-sided sciatica  Difficulty in walking, not elsewhere classified     Problem List Patient Active Problem List   Diagnosis Date Noted  . PMR (polymyalgia rheumatica) (HCC) 09/27/2016  . DDD (degenerative disc disease), lumbar 09/27/2016  . HTN (hypertension) 06/05/2016  . Chronic pain syndrome 06/05/2016  . Acute diverticulitis 06/02/2016  . Diverticulitis large intestine 06/02/2016  . Spinal stenosis of lumbar region 02/29/2016  . Joint pain 02/29/2016  . Endometrial polyp 02/06/2014  . Postmenopausal bleeding 01/30/2014  . GERD (gastroesophageal reflux disease) 09/29/2013  . Spinal stenosis, thoracic 09/29/2013  . Shingles 09/29/2013  . Thoracic or lumbosacral neuritis or radiculitis, unspecified 05/04/2011  . Abnormality of gait 05/04/2011  . Muscle weakness (generalized) 05/04/2011  . KNEE, ARTHRITIS, DEGEN./OSTEO 04/07/2009  . KNEE PAIN 04/07/2009   10:23 AM, 11/22/16 [name], PT, DPT Physical Therapist at [facility] [phone] (office)     [facility] [address] [place], [place], [address] Phone: [phone]   Fax:  [phone]  Name: Nicole [name] MRN: [id number] Date of Birth: 1946-03-10

## 2016-11-24 ENCOUNTER — Ambulatory Visit (HOSPITAL_COMMUNITY): Payer: Medicare Other | Attending: Rheumatology

## 2016-11-24 DIAGNOSIS — R262 Difficulty in walking, not elsewhere classified: Secondary | ICD-10-CM | POA: Diagnosis not present

## 2016-11-24 DIAGNOSIS — M5442 Lumbago with sciatica, left side: Secondary | ICD-10-CM | POA: Insufficient documentation

## 2016-11-24 DIAGNOSIS — G8929 Other chronic pain: Secondary | ICD-10-CM | POA: Diagnosis not present

## 2016-11-24 DIAGNOSIS — M25551 Pain in right hip: Secondary | ICD-10-CM | POA: Diagnosis not present

## 2016-11-24 NOTE — Therapy (Signed)
[place], [place], [address] Phone: [phone]   Fax:  [phone]  Physical Therapy Treatment  Patient Details  Name: Nicole [name] MRN: [id number] Date of Birth: 05/23/1946 Referring Provider: Cy Blamer  Encounter Date: 11/24/2016      PT End of Session - 11/24/16 1125    Visit Number 19   Number of Visits 26   Date for PT Re-Evaluation 11/24/16   Authorization Type Medicare (G-codes done 15th session)   Authorization Time Period 09/20/16- 11/01/16; 11/03/16-12/15/16   Authorization - Visit Number 48   Authorization - Number of Visits 27   PT Start Time 0906   PT Stop Time 0948   PT Time Calculation (min) 42 min   Equipment Utilized During Treatment Gait belt   Activity Tolerance Patient tolerated treatment well;Patient limited by fatigue;Patient limited by pain   Behavior During Therapy [facility] for tasks assessed/performed      Past Medical History:  Diagnosis Date  . AC (acromioclavicular) joint bone spurs    lt shoulder  . Anemia   . Arthritis   . Carpal tunnel syndrome, bilateral   . Gastroesophageal reflux   . Headache    recent visit to ER @ [facility] for severe headache  . Hypertension   . Lumbar stenosis    Hx of ESIs by Dr. Nelva Bush  . Polyarthralgia   . Polymyalgia ([place])   . Shingles   . Spinal stenosis     Past Surgical History:  Procedure Laterality Date  . CHOLECYSTECTOMY    . COLONOSCOPY  06/11/2012   Procedure: COLONOSCOPY;  Surgeon: Jamesetta So, MD;  [facility];  Service: Gastroenterology;  Laterality: N/A;  . HYSTEROSCOPY W/D&C N/A 02/25/2014   Procedure: DILATATION AND CURETTAGE /HYSTEROSCOPY;  Surgeon: Florian Buff, MD;  Location: [facility];  Service: Gynecology;  Laterality: N/A;  . POLYPECTOMY N/A 02/25/2014   Procedure: POLYPECTOMY;  Surgeon: Florian Buff, MD;  Location: [facility];  Service: Gynecology;  Laterality: N/A;  . RESECTION DISTAL CLAVICAL Right 03/26/2015    Procedure: OPEN DISTAL CLAVICAL RESECTION ;  Surgeon: Netta Cedars, MD;  Location: [facility];  Service: Orthopedics;  Laterality: Right;    There were no vitals filed for this visit.                       [facility] Adult PT Treatment/Exercise - 11/24/16 0001      Lumbar Exercises: Aerobic   Stationary Bike NuStep  7 Minute after completion of sesion.      Lumbar Exercises: Seated   Long [employer] on Chair 3 sets;Both;15 reps  4lb on Rt, 1lb on left    Hip Flexion on Ball 10 reps;Both;Strengthening  3x15 Rt. 3x10Lt bilat   Sit to Stand 20 reps  2x10 hands free   Sit to Stand Limitations repeated trunk rotation to Left 2x15 for 3 seconds    Other Seated Lumbar Exercises Trunk flexion in chair with Blue Tband resistance 2x15  Knee flexion resited with blue TB: 3x15 bilat     Lumbar Exercises: Supine   Ab Set --  2x10, abdominal crunches seated with blue TB resisting                  PT Short Term Goals - 11/03/16 1158      PT SHORT TERM GOAL #1   Title After 3 weeks patient will demonstrate improved strength AEB 5xSTS s AD  in <30 seconds.    Baseline Performs 4x in 66s, UE support on legs only at reassessment; at 6 weeks, performs 5xSTS in 19s.    Status Achieved     PT SHORT TERM GOAL #2   Title After 3 weeks pt will demonstrate improved tolerance to AMB able to sustain 6MWT at speed >0.77ms.    Baseline at 6 weeks maximal gait speed is 0.456m    Status Achieved     PT SHORT TERM GOAL #3   Title After 3 weeks pt will report three ways to manage symptoms at home and demonstrate independence in a starter HEP established by PT.    Baseline Pt still limited in self management of symptoms; some HEP activities are too difficult to perform indep.    Status Not Met           PT Long Term Goals - 11/03/16 1109      PT LONG TERM GOAL #1   Title After 6 weeks patient will demonstrate improved strength AEB 5xSTS s AD in <20 seconds.    Baseline 19.4sec  s AD    Status Achieved     PT LONG TERM GOAL #2   Title After 6 weeks pt will demonstrate improved tolerance to AMB able to sustain 6MWT at speed >0.6085m   Baseline Pt has increased gait speed by >400%, and AMB distance by>500%, but still unable to meet these criteria.    Status Not Met     PT LONG TERM GOAL #3   Title After 6 weeks pt will demonstrate independence in an advanced HEP established by PT.    Status Achieved     PT LONG TERM GOAL #4   Title After 6 weeks pt will demonstrate improved strength in BLE AEB 4/5 strength in all BLE groups tested at evaluation.    Baseline Noted increase in strength accross most muscle groups, some still below the 4/5 threshold.    Status Partially Met               Plan - 11/24/16 1127    Clinical Impression Statement Pt continues to make improvements in strength in many areas, able to progress weight and or/reps today again. Activation of Left hip fleixon and knee extension continues to improve. The patient has minimal pain today compared ot baseline, especialyl in the back, but continues to c/o minimal DOMS related to PT sessions.    Rehab Potential Fair   PT Frequency 2x / week   PT Duration 6 weeks   PT Treatment/Interventions Therapeutic exercise;Therapeutic activities;Functional mobility training;Stair training;Gait training;Balance training;Cognitive remediation;Patient/family education;Moist Heat;Passive range of motion;Dry needling;Energy conservation;Electrical Stimulation;Aquatic Therapy;Manual techniques;ADLs/Self Care Home Management;DME Instruction;Neuromuscular re-education   PT Next Visit Plan Cont to follow up with STS exercsies and AFO. Minimal emphasis on stretching in session. Continue with isolated strengthening (active or PT assisted) of lower extemity, particularly L hip flexion and Left knee extension, as well as abdominal strength for stabilization. Avoid lumbar extension due to stenosis and radicular signs. Trial on  Nustep before or after session. Ask abotu joing the [employer] to participate in aquatic therapy and or having access to a recumbent stepper.    PT Home Exercise Plan New HEP:  seated marching, seated ABD with theraband; seated lower back stretch; supine LTR; supine bent knee raise; supine LAQs; Updated on 1/3 to add in seated chair rotation to Left stretch, as well as red TB assist for seated hip flexion.    Consulted and Agree  with Plan of Care Patient      Patient will benefit from skilled therapeutic intervention in order to improve the following deficits and impairments:  Abnormal gait, Impaired sensation, Improper body mechanics, Pain, Decreased activity tolerance, Difficulty walking, Decreased strength, Obesity  Visit Diagnosis: Pain in right hip  Chronic midline low back pain with left-sided sciatica  Difficulty in walking, not elsewhere classified     Problem List Patient Active Problem List   Diagnosis Date Noted  . PMR (polymyalgia rheumatica) (HCC) 09/27/2016  . DDD (degenerative disc disease), lumbar 09/27/2016  . HTN (hypertension) 06/05/2016  . Chronic pain syndrome 06/05/2016  . Acute diverticulitis 06/02/2016  . Diverticulitis large intestine 06/02/2016  . Spinal stenosis of lumbar region 02/29/2016  . Joint pain 02/29/2016  . Endometrial polyp 02/06/2014  . Postmenopausal bleeding 01/30/2014  . GERD (gastroesophageal reflux disease) 09/29/2013  . Spinal stenosis, thoracic 09/29/2013  . Shingles 09/29/2013  . Thoracic or lumbosacral neuritis or radiculitis, unspecified 05/04/2011  . Abnormality of gait 05/04/2011  . Muscle weakness (generalized) 05/04/2011  . KNEE, ARTHRITIS, DEGEN./OSTEO 04/07/2009  . KNEE PAIN 04/07/2009   11:32 AM, 11/24/16 [name], PT, DPT Physical Therapist at [facility] [phone] (office)     [facility] [address] [place], [place], [address] Phone:  [phone]   Fax:  [phone]  Name: Nicole [name] MRN: [id number] Date of Birth: [birth year]

## 2016-11-29 ENCOUNTER — Telehealth (HOSPITAL_COMMUNITY): Payer: Self-pay

## 2016-11-29 ENCOUNTER — Ambulatory Visit (HOSPITAL_COMMUNITY): Payer: Medicare Other

## 2016-11-29 DIAGNOSIS — G8929 Other chronic pain: Secondary | ICD-10-CM

## 2016-11-29 DIAGNOSIS — R262 Difficulty in walking, not elsewhere classified: Secondary | ICD-10-CM

## 2016-11-29 DIAGNOSIS — M5442 Lumbago with sciatica, left side: Secondary | ICD-10-CM | POA: Diagnosis not present

## 2016-11-29 DIAGNOSIS — M25551 Pain in right hip: Secondary | ICD-10-CM

## 2016-11-29 NOTE — Therapy (Signed)
[place], [place], [address] Phone: [phone]   Fax:  [phone]  Physical Therapy Treatment  Patient Details  Name: Nicole [name] MRN: [id number] Date of Birth: 28-Feb-1946 Referring Provider: Cy Blamer  Encounter Date: 11/29/2016      PT End of Session - 11/29/16 1008    Visit Number 20   Number of Visits 26   Date for PT Re-Evaluation 11/24/16   Authorization Type Medicare (G-codes done 15th session)   Authorization Time Period 09/20/16- 11/01/16; 11/03/16-12/15/16   Authorization - Visit Number 30   Authorization - Number of Visits 27   PT Start Time [phone]   PT Stop Time 1030   PT Time Calculation (min) 38 min   Activity Tolerance Patient tolerated treatment well;Patient limited by fatigue;Patient limited by pain   Behavior During Therapy [facility] for tasks assessed/performed      Past Medical History:  Diagnosis Date  . AC (acromioclavicular) joint bone spurs    lt shoulder  . Anemia   . Arthritis   . Carpal tunnel syndrome, bilateral   . Gastroesophageal reflux   . Headache    recent visit to ER @ [facility] for severe headache  . Hypertension   . Lumbar stenosis    Hx of ESIs by Dr. Nelva Bush  . Polyarthralgia   . Polymyalgia ([place])   . Shingles   . Spinal stenosis     Past Surgical History:  Procedure Laterality Date  . CHOLECYSTECTOMY    . COLONOSCOPY  06/11/2012   Procedure: COLONOSCOPY;  Surgeon: Jamesetta So, MD;  [facility];  Service: Gastroenterology;  Laterality: N/A;  . HYSTEROSCOPY W/D&C N/A 02/25/2014   Procedure: DILATATION AND CURETTAGE /HYSTEROSCOPY;  Surgeon: Florian Buff, MD;  Location: [facility];  Service: Gynecology;  Laterality: N/A;  . POLYPECTOMY N/A 02/25/2014   Procedure: POLYPECTOMY;  Surgeon: Florian Buff, MD;  Location: [facility];  Service: Gynecology;  Laterality: N/A;  . RESECTION DISTAL CLAVICAL Right 03/26/2015   Procedure: OPEN DISTAL CLAVICAL RESECTION ;   Surgeon: Netta Cedars, MD;  Location: [place];  Service: Orthopedics;  Laterality: Right;    There were no vitals filed for this visit.      Subjective Assessment - 11/29/16 1000    Subjective Pt doign ok. Her back continues to feel a bit better. Knees still nagging achy, which she thinks is related to the rain. HEP going well at home. She has not contacted the Orthotist due to lack of transportation.    Pertinent History Pt had PT at this location for a related problem in 2012, with good resolution inititally, but January 2017 came back and gradually getting worse. Spinal stenosis diagnosed. Pt reports her weight has been stable for most of her adult years. She has needed a RW since ~March due to progressive weakness, previosuly SPC intermittently for years. Foot drop reported to become more noticeable this year also.    Currently in Pain? Yes   Pain Score 4    Pain Location --  blat knees, Rt shoulder   Pain Onset More than a month ago                         [facility] Adult PT Treatment/Exercise - 11/29/16 0001      Transfers   Five time sit to stand comments  14.6s   hands free     Ambulation/Gait   Ambulation Distance (  Feet) 135 Feet   Assistive device Rolling walker   Gait velocity 0.52ms   Gait Comments limited by Left trochanteric pain     Lumbar Exercises: Seated   Long Arc Quad on Chair Both;15 reps;2 sets  5lb on Rt, 2lb on left    LAQ on Chair Limitations Seated Trunk Extension: 2x10 c ball  Lumbar extension strengthening   Hip Flexion on Ball 10 reps;Both;Strengthening  2x15 bilat   Sit to Stand 20 reps  2x10 hands free   Sit to Stand Limitations repeated trunk rotation to Left 2x15 for 3 seconds   blueTB    Other Seated Lumbar Exercises Trunk flexion in chair with Blue Tband resistance 2x15  Knee flexion resited with blue TB: 2x15 bilat     Manual Therapy   Manual Therapy Myofascial release   Myofascial Release Rt deep gluteals, posterolateral    5 minutes                  PT Short Term Goals - 11/03/16 1158      PT SHORT TERM GOAL #1   Title After 3 weeks patient will demonstrate improved strength AEB 5xSTS s AD in <30 seconds.    Baseline Performs 4x in 66s, UE support on legs only at reassessment; at 6 weeks, performs 5xSTS in 19s.    Status Achieved     PT SHORT TERM GOAL #2   Title After 3 weeks pt will demonstrate improved tolerance to AMB able to sustain 6MWT at speed >0.495m.    Baseline at 6 weeks maximal gait speed is 0.4673m   Status Achieved     PT SHORT TERM GOAL #3   Title After 3 weeks pt will report three ways to manage symptoms at home and demonstrate independence in a starter HEP established by PT.    Baseline Pt still limited in self management of symptoms; some HEP activities are too difficult to perform indep.    Status Not Met           PT Long Term Goals - 11/29/16 1013      PT LONG TERM GOAL #1   Title After 6 weeks patient will demonstrate improved strength AEB 5xSTS s AD in <20 seconds.    Baseline 19.4sec s AD  on 1/12; 14.6s on 2/7     PT LONG TERM GOAL #2   Title After 6 weeks pt will demonstrate improved tolerance to AMB able to sustain 6MWT at speed >0.34m79m  Baseline Tolerated distance continues to improve, however activity limited by new left hip pain.    Status On-going     PT LONG TERM GOAL #3   Title After 6 weeks pt will demonstrate independence in an advanced HEP established by PT.    Status On-going     PT LONG TERM GOAL #4   Title After 6 weeks pt will demonstrate improved strength in BLE AEB 4/5 strength in all BLE groups tested at evaluation.    Baseline Noted increase in strength accross most muscle groups, some still below the 4/5 threshold.    Status On-going               Plan - 11/29/16 1030    Clinical Impression Statement Reassessment performed today. Pt continues to make progress toward goals. he rLLE strength is most notably improved a  sign of improvement from radicular limitations. Trunk strength is also improving. Gait speed is improving as well, with foot drop less limiting,  however new onset left deep gluteal pain is more limiting. Left glute minimus MFR is effective within treatment to address pain and improve function. Will continue with POC as laid out. No changes at this time.    Rehab Potential Fair   PT Frequency 2x / week   PT Duration 6 weeks   PT Treatment/Interventions Therapeutic exercise;Therapeutic activities;Functional mobility training;Stair training;Gait training;Balance training;Cognitive remediation;Patient/family education;Moist Heat;Passive range of motion;Dry needling;Energy conservation;Electrical Stimulation;Aquatic Therapy;Manual techniques;ADLs/Self Care Home Management;DME Instruction;Neuromuscular re-education   PT Next Visit Plan Cont to follow up with STS exercsies and AFO. Minimal emphasis on stretching in session. Continue with isolated strengthening (active or PT assisted) of lower extemity, particularly L hip flexion and Left knee extension, as well as abdominal strength for stabilization. Avoid lumbar extension due to stenosis and radicular signs. Trial on Nustep before or after session. Ask abotu joing the [employer] to participate in aquatic therapy and or having access to a recumbent stepper.    PT Home Exercise Plan New HEP:  seated marching, seated ABD with theraband; seated lower back stretch; supine LTR; supine bent knee raise; supine LAQs; Updated on 1/3 to add in seated chair rotation to Left stretch, as well as red TB assist for seated hip flexion.    Consulted and Agree with Plan of Care Patient      Patient will benefit from skilled therapeutic intervention in order to improve the following deficits and impairments:  Abnormal gait, Impaired sensation, Improper body mechanics, Pain, Decreased activity tolerance, Difficulty walking, Decreased strength, Obesity  Visit Diagnosis: Pain in right  hip  Chronic midline low back pain with left-sided sciatica  Difficulty in walking, not elsewhere classified     Problem List Patient Active Problem List   Diagnosis Date Noted  . PMR (polymyalgia rheumatica) (HCC) 09/27/2016  . DDD (degenerative disc disease), lumbar 09/27/2016  . HTN (hypertension) 06/05/2016  . Chronic pain syndrome 06/05/2016  . Acute diverticulitis 06/02/2016  . Diverticulitis large intestine 06/02/2016  . Spinal stenosis of lumbar region 02/29/2016  . Joint pain 02/29/2016  . Endometrial polyp 02/06/2014  . Postmenopausal bleeding 01/30/2014  . GERD (gastroesophageal reflux disease) 09/29/2013  . Spinal stenosis, thoracic 09/29/2013  . Shingles 09/29/2013  . Thoracic or lumbosacral neuritis or radiculitis, unspecified 05/04/2011  . Abnormality of gait 05/04/2011  . Muscle weakness (generalized) 05/04/2011  . KNEE, ARTHRITIS, DEGEN./OSTEO 04/07/2009  . KNEE PAIN 04/07/2009    10:34 AM, 11/29/16 [name], PT, DPT Physical Therapist at [facility] [phone] (office)     [facility] [address] [place], [place], [address] Phone: [phone]   Fax:  [phone]  Name: Jacie Tristan MRN: [id number] Date of Birth: 1946-10-04

## 2016-12-01 ENCOUNTER — Ambulatory Visit (HOSPITAL_COMMUNITY): Payer: Medicare Other

## 2016-12-01 DIAGNOSIS — G8929 Other chronic pain: Secondary | ICD-10-CM

## 2016-12-01 DIAGNOSIS — M25551 Pain in right hip: Secondary | ICD-10-CM

## 2016-12-01 DIAGNOSIS — R262 Difficulty in walking, not elsewhere classified: Secondary | ICD-10-CM | POA: Diagnosis not present

## 2016-12-01 DIAGNOSIS — M5442 Lumbago with sciatica, left side: Secondary | ICD-10-CM | POA: Diagnosis not present

## 2016-12-01 NOTE — Therapy (Signed)
[place], [place], [address] Phone: [phone]   Fax:  [phone]  Physical Therapy Treatment  Patient Details  Name: Nicole [name] MRN: [id number] Date of Birth: Jul 08, 1946 Referring Provider: Cy Blamer  Encounter Date: 12/01/2016      PT End of Session - 12/01/16 0850    Visit Number 21   Number of Visits 26   Date for PT Re-Evaluation 12/15/16   Authorization Type Medicare (G-codes done 15th session)   Authorization Time Period 09/20/16- 11/01/16; 11/03/16-12/15/16   Authorization - Visit Number 21   Authorization - Number of Visits 27   PT Start Time 0820   PT Stop Time 0900   PT Time Calculation (min) 40 min   Equipment Utilized During Treatment Gait belt   Activity Tolerance Patient tolerated treatment well;Patient limited by fatigue;Patient limited by pain   Behavior During Therapy [facility] for tasks assessed/performed      Past Medical History:  Diagnosis Date  . AC (acromioclavicular) joint bone spurs    lt shoulder  . Anemia   . Arthritis   . Carpal tunnel syndrome, bilateral   . Gastroesophageal reflux   . Headache    recent visit to ER @ [facility] for severe headache  . Hypertension   . Lumbar stenosis    Hx of ESIs by Dr. Nelva Bush  . Polyarthralgia   . Polymyalgia ([place])   . Shingles   . Spinal stenosis     Past Surgical History:  Procedure Laterality Date  . CHOLECYSTECTOMY    . COLONOSCOPY  06/11/2012   Procedure: COLONOSCOPY;  Surgeon: Jamesetta So, MD;  [facility];  Service: Gastroenterology;  Laterality: N/A;  . HYSTEROSCOPY W/D&C N/A 02/25/2014   Procedure: DILATATION AND CURETTAGE /HYSTEROSCOPY;  Surgeon: Florian Buff, MD;  Location: [facility];  Service: Gynecology;  Laterality: N/A;  . POLYPECTOMY N/A 02/25/2014   Procedure: POLYPECTOMY;  Surgeon: Florian Buff, MD;  Location: [facility];  Service: Gynecology;  Laterality: N/A;  . RESECTION DISTAL CLAVICAL Right 03/26/2015    Procedure: OPEN DISTAL CLAVICAL RESECTION ;  Surgeon: Netta Cedars, MD;  Location: [place];  Service: Orthopedics;  Laterality: Right;    There were no vitals filed for this visit.      Subjective Assessment - 12/01/16 0833    Subjective Pt is doign ok today, generally sore all over without explanation. After MFR to Left gluteminimus.medius last session, she had reduced pain for 2 days, but this morning it has risen back to a 6-7/10.    Pertinent History Pt had PT at this location for a related problem in 2012, with good resolution inititally, but January 2017 came back and gradually getting worse. Spinal stenosis diagnosed. Pt reports her weight has been stable for most of her adult years. She has needed a RW since ~March due to progressive weakness, previosuly SPC intermittently for years. Foot drop reported to become more noticeable this year also.    Currently in Pain? Yes   Pain Score 7    Pain Location --  left GTPS                         [facility] Adult PT Treatment/Exercise - 12/01/16 0001      Ambulation/Gait   Ambulation Distance (Feet) 450 Feet  75'x6   Assistive device Rollator   Gait velocity 0.44-0.83m/s each bout  MFR require to left glute min/med to complete  adn reduce pai   Pre-Gait Activities side stepping in // bars: 3x73f bilat  60sec rest between: minA for LLE during Lt side step   Gait Comments Maxmial velocity interval repeats: 759f 1-22m55mtes rest between   forward flexed     Lumbar Exercises: Seated   LAQ on Chair Limitations Seated Trunk Extension: 2x10 c ball  Lumbar extension strengthening   Other Seated Lumbar Exercises Trunk flexion in chair with Blue Tband resistance 2x15  Knee flexion resited with blue TB: 2x15 bilat     Manual Therapy   Manual Therapy Myofascial release   Myofascial Release Rt deep gluteals, glute med/min  5 minutes                  PT Short Term Goals - 11/03/16 1158      PT SHORT TERM GOAL #1    Title After 3 weeks patient will demonstrate improved strength AEB 5xSTS s AD in <30 seconds.    Baseline Performs 4x in 66s, UE support on legs only at reassessment; at 6 weeks, performs 5xSTS in 19s.    Status Achieved     PT SHORT TERM GOAL #2   Title After 3 weeks pt will demonstrate improved tolerance to AMB able to sustain 6MWT at speed >0.40m51m   Baseline at 6 weeks maximal gait speed is 0.34m/722m Status Achieved     PT SHORT TERM GOAL #3   Title After 3 weeks pt will report three ways to manage symptoms at home and demonstrate independence in a starter HEP established by PT.    Baseline Pt still limited in self management of symptoms; some HEP activities are too difficult to perform indep.    Status Not Met           PT Long Term Goals - 11/29/16 1013      PT LONG TERM GOAL #1   Title After 6 weeks patient will demonstrate improved strength AEB 5xSTS s AD in <20 seconds.    Baseline 19.4sec s AD  on 1/12; 14.6s on 2/7     PT LONG TERM GOAL #2   Title After 6 weeks pt will demonstrate improved tolerance to AMB able to sustain 6MWT at speed >0.8m/s61mBaseline Tolerated distance continues to improve, however activity limited by new left hip pain.    Status On-going     PT LONG TERM GOAL #3   Title After 6 weeks pt will demonstrate independence in an advanced HEP established by PT.    Status On-going     PT LONG TERM GOAL #4   Title After 6 weeks pt will demonstrate improved strength in BLE AEB 4/5 strength in all BLE groups tested at evaluation.    Baseline Noted increase in strength accross most muscle groups, some still below the 4/5 threshold.    Status On-going               Plan - 12/01/16 0855    Clinical Impression Statement Sesssion today moving toward more functional moblity training. MFR to lef thip continues to help reduce pain to some degree in session, improveing funcitonal tolerance. Patient is very much limited by fatigue and difficulty with  getting into out off // bars, requireing some min assist to perform.  Pt appears to get stuck a few times unable to lift LLE and needing to sit emergently to avoid collapse from fatigue. Maximal gait speed is progressing well, now >0.50m/s 64mng 75' repeats.  Making progress toward goals overall.    Rehab Potential Fair   PT Frequency 2x / week   PT Duration 6 weeks   PT Treatment/Interventions Therapeutic exercise;Therapeutic activities;Functional mobility training;Stair training;Gait training;Balance training;Cognitive remediation;Patient/family education;Moist Heat;Passive range of motion;Dry needling;Energy conservation;Electrical Stimulation;Aquatic Therapy;Manual techniques;ADLs/Self Care Home Management;DME Instruction;Neuromuscular re-education   PT Next Visit Plan Cont to transition to functional exercises gradually and FU on pt making AFO appt. Minimal emphasis on stretching in session. Continue with isolated strengthening LLE as tolerated. (active or PT assisted) of lower extemity, particularly L hip flexion and Left knee extension, as well as abdominal strength for stabilization. Avoid lumbar extension due to stenosis and radicular signs. Trial on Nustep before or after session. Ask abotu joing the [employer] to participate in aquatic therapy and or having access to a recumbent stepper.    PT Home Exercise Plan New HEP:  seated marching, seated ABD with theraband; seated lower back stretch; supine LTR; supine bent knee raise; supine LAQs; Updated on 1/3 to add in seated chair rotation to Left stretch, as well as red TB assist for seated hip flexion.       Patient will benefit from skilled therapeutic intervention in order to improve the following deficits and impairments:  Abnormal gait, Impaired sensation, Improper body mechanics, Pain, Decreased activity tolerance, Difficulty walking, Decreased strength, Obesity  Visit Diagnosis: Pain in right hip  Chronic midline low back pain with left-sided  sciatica  Difficulty in walking, not elsewhere classified     Problem List Patient Active Problem List   Diagnosis Date Noted  . PMR (polymyalgia rheumatica) (HCC) 09/27/2016  . DDD (degenerative disc disease), lumbar 09/27/2016  . HTN (hypertension) 06/05/2016  . Chronic pain syndrome 06/05/2016  . Acute diverticulitis 06/02/2016  . Diverticulitis large intestine 06/02/2016  . Spinal stenosis of lumbar region 02/29/2016  . Joint pain 02/29/2016  . Endometrial polyp 02/06/2014  . Postmenopausal bleeding 01/30/2014  . GERD (gastroesophageal reflux disease) 09/29/2013  . Spinal stenosis, thoracic 09/29/2013  . Shingles 09/29/2013  . Thoracic or lumbosacral neuritis or radiculitis, unspecified 05/04/2011  . Abnormality of gait 05/04/2011  . Muscle weakness (generalized) 05/04/2011  . KNEE, ARTHRITIS, DEGEN./OSTEO 04/07/2009  . KNEE PAIN 04/07/2009    9:02 AM, 12/01/16 [name], PT, DPT Physical Therapist at [facility] [phone] (office)     [facility] [address] [place], [place], [address] Phone: [phone]   Fax:  [phone]  Name: Gayla Benn MRN: [id number] Date of Birth: [birth year]

## 2016-12-04 ENCOUNTER — Ambulatory Visit (HOSPITAL_COMMUNITY): Payer: Medicare Other

## 2016-12-04 DIAGNOSIS — M5442 Lumbago with sciatica, left side: Secondary | ICD-10-CM

## 2016-12-04 DIAGNOSIS — M25551 Pain in right hip: Secondary | ICD-10-CM

## 2016-12-04 DIAGNOSIS — G8929 Other chronic pain: Secondary | ICD-10-CM

## 2016-12-04 DIAGNOSIS — R262 Difficulty in walking, not elsewhere classified: Secondary | ICD-10-CM | POA: Diagnosis not present

## 2016-12-04 NOTE — Therapy (Signed)
[facility], [place], [address] Phone: [phone]   Fax:  [phone]  Physical Therapy Treatment  Patient Details  Name: Nicole [name] MRN: [id number] Date of Birth: [birth year] Referring Provider: Cy Blamer  Encounter Date: 12/04/2016      PT End of Session - 12/04/16 1319    Visit Number 22   Number of Visits 26   Date for PT Re-Evaluation 12/15/16   Authorization Type Medicare (G-codes done 15th session)   Authorization Time Period 09/20/16- 11/01/16; 11/03/16-12/15/16   Authorization - Visit Number 39   Authorization - Number of Visits 27   PT Start Time 1304   PT Stop Time 1342   PT Time Calculation (min) 38 min   Activity Tolerance Patient tolerated treatment well;Patient limited by fatigue   Behavior During Therapy [facility] for tasks assessed/performed      Past Medical History:  Diagnosis Date  . AC (acromioclavicular) joint bone spurs    lt shoulder  . Anemia   . Arthritis   . Carpal tunnel syndrome, bilateral   . Gastroesophageal reflux   . Headache    recent visit to ER @ [facility] for severe headache  . Hypertension   . Lumbar stenosis    Hx of ESIs by Dr. Nelva Bush  . Polyarthralgia   . Polymyalgia ([place])   . Shingles   . Spinal stenosis     Past Surgical History:  Procedure Laterality Date  . CHOLECYSTECTOMY    . COLONOSCOPY  06/11/2012   Procedure: COLONOSCOPY;  Surgeon: Jamesetta So, MD;  [facility];  Service: Gastroenterology;  Laterality: N/A;  . HYSTEROSCOPY W/D&C N/A 02/25/2014   Procedure: DILATATION AND CURETTAGE /HYSTEROSCOPY;  Surgeon: Florian Buff, MD;  Location: [facility];  Service: Gynecology;  Laterality: N/A;  . POLYPECTOMY N/A 02/25/2014   Procedure: POLYPECTOMY;  Surgeon: Florian Buff, MD;  Location: [facility];  Service: Gynecology;  Laterality: N/A;  . RESECTION DISTAL CLAVICAL Right 03/26/2015   Procedure: OPEN DISTAL CLAVICAL RESECTION ;  Surgeon: Netta Cedars,  MD;  Location: [facility];  Service: Orthopedics;  Laterality: Right;    There were no vitals filed for this visit.      Subjective Assessment - 12/04/16 1309    Subjective Pt is doing good today. She saysa that her knee was hurting worse after extensive AMB training last session, but is now better. Overall she says that her pain is improved but still continues to have chornic opain in multiple places. a   Pertinent History Pt had PT at this location for a related problem in 2012, with good resolution inititally, but January 2017 came back and gradually getting worse. Spinal stenosis diagnosed. Pt reports her weight has been stable for most of her adult years. She has needed a RW since ~March due to progressive weakness, previosuly SPC intermittently for years. Foot drop reported to become more noticeable this year also.    Currently in Pain? --  difficulty ratign du eto multiple areas: reports opverall improved.                          [facility] Adult PT Treatment/Exercise - 12/04/16 0001      Ambulation/Gait   Ambulation Distance (Feet) 300 Feet   Assistive device Rollator   Gait velocity 0.34ms  best time for 4x756frepeats   Pre-Gait Activities Lateral side stepping 1x10' bilat in // bars/. Assistance for Left  foot drop  limited by time: pt needs to leave early   Gait Comments Maxmial velocity interval repeats: 16f, 1-2 minutes rest between   forward flexed     Lumbar Exercises: Seated   Long Arc Quad on Chair Both;15 reps;2 sets  7.5lb on Rt, 3lb on left    LAQ on Chair Limitations Seated Trunk Extension: 2x10 c ball  Verbal; cues to maintain a comf range s back pain   Hip Flexion on Ball 10 reps;Both;Strengthening  2x15 bilat   Sit to Stand Limitations repeated trunk rotation to Left 2x15x1sH c Blue ball (3000g)   Other Seated Lumbar Exercises Trunk flexion in chair with Blue Tband resistance 2x15  Knee flexion resited with blue TB: 2x15 bilat                   PT Short Term Goals - 11/03/16 1158      PT SHORT TERM GOAL #1   Title After 3 weeks patient will demonstrate improved strength AEB 5xSTS s AD in <30 seconds.    Baseline Performs 4x in 66s, UE support on legs only at reassessment; at 6 weeks, performs 5xSTS in 19s.    Status Achieved     PT SHORT TERM GOAL #2   Title After 3 weeks pt will demonstrate improved tolerance to AMB able to sustain 6MWT at speed >0.429m.    Baseline at 6 weeks maximal gait speed is 0.4629m   Status Achieved     PT SHORT TERM GOAL #3   Title After 3 weeks pt will report three ways to manage symptoms at home and demonstrate independence in a starter HEP established by PT.    Baseline Pt still limited in self management of symptoms; some HEP activities are too difficult to perform indep.    Status Not Met           PT Long Term Goals - 11/29/16 1013      PT LONG TERM GOAL #1   Title After 6 weeks patient will demonstrate improved strength AEB 5xSTS s AD in <20 seconds.    Baseline 19.4sec s AD  on 1/12; 14.6s on 2/7     PT LONG TERM GOAL #2   Title After 6 weeks pt will demonstrate improved tolerance to AMB able to sustain 6MWT at speed >0.49m65m  Baseline Tolerated distance continues to improve, however activity limited by new left hip pain.    Status On-going     PT LONG TERM GOAL #3   Title After 6 weeks pt will demonstrate independence in an advanced HEP established by PT.    Status On-going     PT LONG TERM GOAL #4   Title After 6 weeks pt will demonstrate improved strength in BLE AEB 4/5 strength in all BLE groups tested at evaluation.    Baseline Noted increase in strength accross most muscle groups, some still below the 4/5 threshold.    Status On-going               Plan - 12/04/16 1320    Clinical Impression Statement Pt continues to demonstrate progrerss overall, with greater weight  and/or reps for many exercises this session. Pain in better  controlled today, and radicular pain continues to improve. Lumbar instability is improved when cued to maintain motion during therex in a comfortable range. No changes to POC, will continue to progress tow eight bearing and funcitonal activity. AFO appointment still not set up, she is now  trying to locate prescription which she thinks is in her car. Pt continues to demonstrate the most difficulty with lateral side stepping in bars. Fwd AMB remains typical of LSS with forward flexion and weight bearing throughout BUE on AD.    Rehab Potential Fair   PT Frequency 2x / week   PT Duration 6 weeks   PT Treatment/Interventions Therapeutic exercise;Therapeutic activities;Functional mobility training;Stair training;Gait training;Balance training;Cognitive remediation;Patient/family education;Moist Heat;Passive range of motion;Dry needling;Energy conservation;Electrical Stimulation;Aquatic Therapy;Manual techniques;ADLs/Self Care Home Management;DME Instruction;Neuromuscular re-education   PT Next Visit Plan Cont to transition to functional exercises gradually and FU on pt making AFO appt. Minimal emphasis on stretching in session. Continue with isolated strengthening LLE as tolerated. (active or PT assisted) of lower extemity, particularly L hip flexion and Left knee extension, as well as abdominal strength for stabilization. Avoid lumbar extension due to stenosis and radicular signs. Trial on Nustep before or after session. Ask abotu joing the [employer] to participate in aquatic therapy and or having access to a recumbent stepper.    PT Home Exercise Plan New HEP:  seated marching, seated ABD with theraband; seated lower back stretch; supine LTR; supine bent knee raise; supine LAQs; Updated on 1/3 to add in seated chair rotation to Left stretch, as well as red TB assist for seated hip flexion.    Consulted and Agree with Plan of Care Patient      Patient will benefit from skilled therapeutic intervention in order to  improve the following deficits and impairments:  Abnormal gait, Impaired sensation, Improper body mechanics, Pain, Decreased activity tolerance, Difficulty walking, Decreased strength, Obesity  Visit Diagnosis: Pain in right hip  Chronic midline low back pain with left-sided sciatica  Difficulty in walking, not elsewhere classified     Problem List Patient Active Problem List   Diagnosis Date Noted  . PMR (polymyalgia rheumatica) (HCC) 09/27/2016  . DDD (degenerative disc disease), lumbar 09/27/2016  . HTN (hypertension) 06/05/2016  . Chronic pain syndrome 06/05/2016  . Acute diverticulitis 06/02/2016  . Diverticulitis large intestine 06/02/2016  . Spinal stenosis of lumbar region 02/29/2016  . Joint pain 02/29/2016  . Endometrial polyp 02/06/2014  . Postmenopausal bleeding 01/30/2014  . GERD (gastroesophageal reflux disease) 09/29/2013  . Spinal stenosis, thoracic 09/29/2013  . Shingles 09/29/2013  . Thoracic or lumbosacral neuritis or radiculitis, unspecified 05/04/2011  . Abnormality of gait 05/04/2011  . Muscle weakness (generalized) 05/04/2011  . KNEE, ARTHRITIS, DEGEN./OSTEO 04/07/2009  . KNEE PAIN 04/07/2009    Annaleise Burger C 12/04/2016, 1:46 PM  [facility] [address] [place], [place], [address] Phone: [phone]   Fax:  [phone]  Name: Nicole [name] MRN: [id number] Date of Birth: [birth year]   1:46 PM, 12/04/16 [name], PT, DPT Physical Therapist - [facility] [phone] (mobile)

## 2016-12-06 ENCOUNTER — Ambulatory Visit (HOSPITAL_COMMUNITY): Payer: Medicare Other | Admitting: Physical Therapy

## 2016-12-06 DIAGNOSIS — G8929 Other chronic pain: Secondary | ICD-10-CM | POA: Diagnosis not present

## 2016-12-06 DIAGNOSIS — M5442 Lumbago with sciatica, left side: Secondary | ICD-10-CM

## 2016-12-06 DIAGNOSIS — R262 Difficulty in walking, not elsewhere classified: Secondary | ICD-10-CM | POA: Diagnosis not present

## 2016-12-06 DIAGNOSIS — M25551 Pain in right hip: Secondary | ICD-10-CM | POA: Diagnosis not present

## 2016-12-06 NOTE — Therapy (Signed)
[facility] [place], [place], [address] Phone: [phone]   Fax:  [phone]  Physical Therapy Treatment  Patient Details  Name: Nicole [name] MRN: [id number] Date of Birth: 12-04-1945 Referring Provider: Cy Blamer  Encounter Date: 12/06/2016      PT End of Session - 12/06/16 1202    Visit Number 23   Number of Visits 26   Date for PT Re-Evaluation 12/15/16   Authorization Type Medicare (G-codes done 15th session)   Authorization Time Period 09/20/16- 11/01/16; 11/03/16-12/15/16   Authorization - Visit Number 23   Authorization - Number of Visits 27   PT Start Time 1119   PT Stop Time 1157   PT Time Calculation (min) 38 min   Activity Tolerance Patient tolerated treatment well;Patient limited by fatigue;Patient limited by pain   Behavior During Therapy [facility] for tasks assessed/performed      Past Medical History:  Diagnosis Date  . AC (acromioclavicular) joint bone spurs    lt shoulder  . Anemia   . Arthritis   . Carpal tunnel syndrome, bilateral   . Gastroesophageal reflux   . Headache    recent visit to ER @ [facility] for severe headache  . Hypertension   . Lumbar stenosis    Hx of ESIs by Dr. Nelva Bush  . Polyarthralgia   . Polymyalgia ([place])   . Shingles   . Spinal stenosis     Past Surgical History:  Procedure Laterality Date  . CHOLECYSTECTOMY    . COLONOSCOPY  06/11/2012   Procedure: COLONOSCOPY;  Surgeon: Jamesetta So, MD;  [facility];  Service: Gastroenterology;  Laterality: N/A;  . HYSTEROSCOPY W/D&C N/A 02/25/2014   Procedure: DILATATION AND CURETTAGE /HYSTEROSCOPY;  Surgeon: Florian Buff, MD;  Location: [facility];  Service: Gynecology;  Laterality: N/A;  . POLYPECTOMY N/A 02/25/2014   Procedure: POLYPECTOMY;  Surgeon: Florian Buff, MD;  Location: [facility];  Service: Gynecology;  Laterality: N/A;  . RESECTION DISTAL CLAVICAL Right 03/26/2015   Procedure: OPEN DISTAL CLAVICAL RESECTION ;   Surgeon: Netta Cedars, MD;  Location: [place];  Service: Orthopedics;  Laterality: Right;    There were no vitals filed for this visit.      Subjective Assessment - 12/06/16 1121    Subjective Patient arrives today stating that she is doing well, she is still having pain from general arthritis at this point. She is just sore in general across her back, which is a little unusual.  She states that she has an appointment to get her AFO on Monday. She states that she would have problems going to the pool as she cannot get herself dressed/undressed when she is wet and she does not have anyone that would be able to help her with that right now. Her arms are very sore today, she even had trouble getting her hat on they are so sore. SHe is however potentially interested in getting on the Nustep at the [facility].    Pertinent History Pt had PT at this location for a related problem in 2012, with good resolution inititally, but January 2017 came back and gradually getting worse. Spinal stenosis diagnosed. Pt reports her weight has been stable for most of her adult years. She has needed a RW since ~March due to progressive weakness, previosuly SPC intermittently for years. Foot drop reported to become more noticeable this year also.    Patient Stated Goals Return to walking and being more independent with  ADL, meals. About 2 years, pt had no limitations walking for prolonged.    Currently in Pain? Yes   Pain Score 5   hips and shoulders, across back    Pain Location Other (Comment)  shoulders, hips and back    Pain Orientation Other (Comment)  shoulders, hips and back    Pain Descriptors / Indicators Penetrating;Sore;Stabbing   Pain Type Chronic pain   Pain Radiating Towards just across low back/into butt cheeks    Pain Onset More than a month ago   Pain Frequency Constant   Aggravating Factors  not sure    Pain Relieving Factors being still    Effect of Pain on Daily Activities pushes through pain, can do it  at her own pace                          [facility] Adult PT Treatment/Exercise - 12/06/16 0001      Ambulation/Gait   Ambulation Distance (Feet) 50 Feet  x4   Assistive device Rollator   Gait velocity 0.4ms      Lumbar Exercises: Seated   Long Arc Quad on Chair Both;2 sets;15 reps   LAQ on Chair Weights (lbs) 7.5# R, 3# L    Sit to Stand Limitations trunk rotation L and R with 3000g ball    Other Seated Lumbar Exercises hamstring curls with blue TB 1x15; adjusted due to poor form to green TB 1x15                PT Education - 12/06/16 1202    Education provided Yes   Education Details [employer], Nustep/pool exercise; [employer] waiver    Person(s) Educated Patient   Methods Explanation   Comprehension Verbalized understanding          PT Short Term Goals - 11/03/16 1158      PT SHORT TERM GOAL #1   Title After 3 weeks patient will demonstrate improved strength AEB 5xSTS s AD in <30 seconds.    Baseline Performs 4x in 66s, UE support on legs only at reassessment; at 6 weeks, performs 5xSTS in 19s.    Status Achieved     PT SHORT TERM GOAL #2   Title After 3 weeks pt will demonstrate improved tolerance to AMB able to sustain 6MWT at speed >0.441m.    Baseline at 6 weeks maximal gait speed is 0.4662m   Status Achieved     PT SHORT TERM GOAL #3   Title After 3 weeks pt will report three ways to manage symptoms at home and demonstrate independence in a starter HEP established by PT.    Baseline Pt still limited in self management of symptoms; some HEP activities are too difficult to perform indep.    Status Not Met           PT Long Term Goals - 11/29/16 1013      PT LONG TERM GOAL #1   Title After 6 weeks patient will demonstrate improved strength AEB 5xSTS s AD in <20 seconds.    Baseline 19.4sec s AD  on 1/12; 14.6s on 2/7     PT LONG TERM GOAL #2   Title After 6 weeks pt will demonstrate improved tolerance to AMB able to sustain 6MWT at speed  >0.12m110m  Baseline Tolerated distance continues to improve, however activity limited by new left hip pain.    Status On-going     PT LONG TERM GOAL #3  Title After 6 weeks pt will demonstrate independence in an advanced HEP established by PT.    Status On-going     PT LONG TERM GOAL #4   Title After 6 weeks pt will demonstrate improved strength in BLE AEB 4/5 strength in all BLE groups tested at evaluation.    Baseline Noted increase in strength accross most muscle groups, some still below the 4/5 threshold.    Status On-going               Plan - 12/06/16 1203    Clinical Impression Statement Patient arrives today reporting she is not feeling 100%, she is just very sore and she believes it is due to the weather; continued with previously performed exercises and current POC however able to perform less reps/less exercises today due to fatigue/general soreness. Adjusted therbands as needed to improve exercise ROM and form. Discussed AFO appointment as well as [employer] exercise on Nustep and gave patient [employer] waiver.    Rehab Potential Fair   PT Frequency 2x / week   PT Duration 6 weeks   PT Treatment/Interventions Therapeutic exercise;Therapeutic activities;Functional mobility training;Stair training;Gait training;Balance training;Cognitive remediation;Patient/family education;Moist Heat;Passive range of motion;Dry needling;Energy conservation;Electrical Stimulation;Aquatic Therapy;Manual techniques;ADLs/Self Care Home Management;DME Instruction;Neuromuscular re-education   PT Next Visit Plan Cont to transition to functional exercises gradually and FU on pt making AFO appt. Minimal emphasis on stretching in session. Continue with isolated strengthening LLE as tolerated. (active or PT assisted) of lower extemity, particularly L hip flexion and Left knee extension, as well as abdominal strength for stabilization. Avoid lumbar extension due to stenosis and radicular signs. Trial on Nustep  before or after session. Ask abotu joing the [employer] to participate in aquatic therapy and or having access to a recumbent stepper.    PT Home Exercise Plan New HEP:  seated marching, seated ABD with theraband; seated lower back stretch; supine LTR; supine bent knee raise; supine LAQs; Updated on 1/3 to add in seated chair rotation to Left stretch, as well as red TB assist for seated hip flexion.    Consulted and Agree with Plan of Care Patient      Patient will benefit from skilled therapeutic intervention in order to improve the following deficits and impairments:  Abnormal gait, Impaired sensation, Improper body mechanics, Pain, Decreased activity tolerance, Difficulty walking, Decreased strength, Obesity  Visit Diagnosis: Pain in right hip  Chronic midline low back pain with left-sided sciatica  Difficulty in walking, not elsewhere classified     Problem List Patient Active Problem List   Diagnosis Date Noted  . PMR (polymyalgia rheumatica) (HCC) 09/27/2016  . DDD (degenerative disc disease), lumbar 09/27/2016  . HTN (hypertension) 06/05/2016  . Chronic pain syndrome 06/05/2016  . Acute diverticulitis 06/02/2016  . Diverticulitis large intestine 06/02/2016  . Spinal stenosis of lumbar region 02/29/2016  . Joint pain 02/29/2016  . Endometrial polyp 02/06/2014  . Postmenopausal bleeding 01/30/2014  . GERD (gastroesophageal reflux disease) 09/29/2013  . Spinal stenosis, thoracic 09/29/2013  . Shingles 09/29/2013  . Thoracic or lumbosacral neuritis or radiculitis, unspecified 05/04/2011  . Abnormality of gait 05/04/2011  . Muscle weakness (generalized) 05/04/2011  . KNEE, ARTHRITIS, DEGEN./OSTEO 04/07/2009  . KNEE PAIN 04/07/2009     Deniece Ree PT, DPT [facility] [address] [place], [place], [address] Phone: [phone]   Fax:  [phone]  Name: Nicole [name] MRN: [id number] Date of Birth: [birth year]

## 2016-12-07 ENCOUNTER — Encounter: Payer: Self-pay | Admitting: *Deleted

## 2016-12-07 DIAGNOSIS — H2513 Age-related nuclear cataract, bilateral: Secondary | ICD-10-CM | POA: Diagnosis not present

## 2016-12-07 DIAGNOSIS — M069 Rheumatoid arthritis, unspecified: Secondary | ICD-10-CM | POA: Insufficient documentation

## 2016-12-07 DIAGNOSIS — H04123 Dry eye syndrome of bilateral lacrimal glands: Secondary | ICD-10-CM | POA: Diagnosis not present

## 2016-12-07 DIAGNOSIS — H524 Presbyopia: Secondary | ICD-10-CM | POA: Diagnosis not present

## 2016-12-07 DIAGNOSIS — Z79899 Other long term (current) drug therapy: Secondary | ICD-10-CM | POA: Diagnosis not present

## 2016-12-13 ENCOUNTER — Ambulatory Visit (HOSPITAL_COMMUNITY): Payer: Medicare Other

## 2016-12-13 DIAGNOSIS — M5442 Lumbago with sciatica, left side: Secondary | ICD-10-CM

## 2016-12-13 DIAGNOSIS — R262 Difficulty in walking, not elsewhere classified: Secondary | ICD-10-CM | POA: Diagnosis not present

## 2016-12-13 DIAGNOSIS — G8929 Other chronic pain: Secondary | ICD-10-CM

## 2016-12-13 DIAGNOSIS — M25551 Pain in right hip: Secondary | ICD-10-CM | POA: Diagnosis not present

## 2016-12-13 NOTE — Therapy (Signed)
[place], [place], [address] Phone: [phone]   Fax:  [phone]  Physical Therapy Treatment  Patient Details  Name: Nicole [name] MRN: [id number] Date of Birth: 1945/11/03 Referring Provider: Cy Blamer  Encounter Date: 12/13/2016      PT End of Session - 12/13/16 1259    Visit Number 24   Number of Visits 25   Date for PT Re-Evaluation 12/15/16   Authorization Type Medicare (G-codes done 15th session)   Authorization Time Period 09/20/16- 11/01/16; 11/03/16-12/15/16   Authorization - Visit Number 24   Authorization - Number of Visits 27   PT Start Time 1128   PT Stop Time 4650   PT Time Calculation (min) 36 min   Activity Tolerance Patient tolerated treatment well;Patient limited by fatigue;Patient limited by pain   Behavior During Therapy [facility] for tasks assessed/performed      Past Medical History:  Diagnosis Date  . AC (acromioclavicular) joint bone spurs    lt shoulder  . Anemia   . Arthritis   . Carpal tunnel syndrome, bilateral   . Gastroesophageal reflux   . Headache    recent visit to ER @ [facility] for severe headache  . Hypertension   . Lumbar stenosis    Hx of ESIs by Dr. Nelva Bush  . Polyarthralgia   . Polymyalgia ([place])   . Shingles   . Spinal stenosis     Past Surgical History:  Procedure Laterality Date  . CHOLECYSTECTOMY    . COLONOSCOPY  06/11/2012   Procedure: COLONOSCOPY;  Surgeon: Jamesetta So, MD;  [facility];  Service: Gastroenterology;  Laterality: N/A;  . HYSTEROSCOPY W/D&C N/A 02/25/2014   Procedure: DILATATION AND CURETTAGE /HYSTEROSCOPY;  Surgeon: Florian Buff, MD;  Location: [facility];  Service: Gynecology;  Laterality: N/A;  . POLYPECTOMY N/A 02/25/2014   Procedure: POLYPECTOMY;  Surgeon: Florian Buff, MD;  Location: [facility];  Service: Gynecology;  Laterality: N/A;  . RESECTION DISTAL CLAVICAL Right 03/26/2015   Procedure: OPEN DISTAL CLAVICAL RESECTION ;   Surgeon: Netta Cedars, MD;  Location: [place];  Service: Orthopedics;  Laterality: Right;    There were no vitals filed for this visit.                       [place] Adult PT Treatment/Exercise - 12/13/16 0001      Bed Mobility   Bed Mobility Right Sidelying to Sit;Sit to Sidelying Right  Educaiton/review log rolling form: ModA required     Lumbar Exercises: Supine   Bridge --  Supine Bridging 3x10   Other Supine Lumbar Exercises Supine Marching 3x10 reciprocally; blueTB assist on Left  SAQ: 2x10 5lb Rt, 3lb Lt with Min A for TKE on left   Other Supine Lumbar Exercises Supine Lateral hip Abduction Heel slides on board 1x15 bilat                  PT Education - 12/13/16 1258    Education provided Yes   Education Details Discussed at length the following topics 12-15 minutes: benefits of in-home aid/transition to ALF; return to [employer] at DC; need for compression stocking for AFO use and how to acquire/don stockings   Person(s) Educated Patient   Methods Explanation;Demonstration   Comprehension Verbalized understanding;Returned demonstration          PT Short Term Goals - 11/03/16 1158      PT SHORT TERM GOAL #  1   Title After 3 weeks patient will demonstrate improved strength AEB 5xSTS s AD in <30 seconds.    Baseline Performs 4x in 66s, UE support on legs only at reassessment; at 6 weeks, performs 5xSTS in 19s.    Status Achieved     PT SHORT TERM GOAL #2   Title After 3 weeks pt will demonstrate improved tolerance to AMB able to sustain 6MWT at speed >0.72ms.    Baseline at 6 weeks maximal gait speed is 0.471m    Status Achieved     PT SHORT TERM GOAL #3   Title After 3 weeks pt will report three ways to manage symptoms at home and demonstrate independence in a starter HEP established by PT.    Baseline Pt still limited in self management of symptoms; some HEP activities are too difficult to perform indep.    Status Not Met            PT Long Term Goals - 11/29/16 1013      PT LONG TERM GOAL #1   Title After 6 weeks patient will demonstrate improved strength AEB 5xSTS s AD in <20 seconds.    Baseline 19.4sec s AD  on 1/12; 14.6s on 2/7     PT LONG TERM GOAL #2   Title After 6 weeks pt will demonstrate improved tolerance to AMB able to sustain 6MWT at speed >0.6037m   Baseline Tolerated distance continues to improve, however activity limited by new left hip pain.    Status On-going     PT LONG TERM GOAL #3   Title After 6 weeks pt will demonstrate independence in an advanced HEP established by PT.    Status On-going     PT LONG TERM GOAL #4   Title After 6 weeks pt will demonstrate improved strength in BLE AEB 4/5 strength in all BLE groups tested at evaluation.    Baseline Noted increase in strength accross most muscle groups, some still below the 4/5 threshold.    Status On-going               Plan - 12/13/16 1300    Clinical Impression Statement Pt tolerating session well today, continued focus on core and hip strengthening to improve pelvic stability during transfers, standing, and gait. The patient reports she has seen the prosthetist regarding acquisition of AFO and it is awaiting insurance approval. Education si given on the need to start utilizing compression stockings to manage pitting edema prior to use of AFO to avoid skin breakdown. Pt continues to required minimal to moderate physical assistance with LLE during select exercises. Session started late today due to some scheduling difficulties with earlier patients.    Rehab Potential Fair   PT Frequency 2x / week   PT Duration 6 weeks   PT Treatment/Interventions Therapeutic exercise;Therapeutic activities;Functional mobility training;Stair training;Gait training;Balance training;Cognitive remediation;Patient/family education;Moist Heat;Passive range of motion;Dry needling;Energy conservation;Electrical Stimulation;Aquatic Therapy;Manual  techniques;ADLs/Self Care Home Management;DME Instruction;Neuromuscular re-education   PT Next Visit Plan Next session is final and DC. Pt will need extensive HEP updated to continue to progress AMB, hip strengthening, and abdominal strengthening. These will need to take into consideration limited RUE dysfunction and pain pain, as well as poor tolerance to sidelying and prone.    PT Home Exercise Plan New HEP: seated marching, seated ABD with theraband; seated lower back stretch; supine LTR; supine bent knee raise; supine LAQs; Updated on 1/3 to add in seated chair rotation to Left stretch, as  well as red TB assist for seated hip flexion.    Consulted and Agree with Plan of Care Patient      Patient will benefit from skilled therapeutic intervention in order to improve the following deficits and impairments:  Abnormal gait, Impaired sensation, Improper body mechanics, Pain, Decreased activity tolerance, Difficulty walking, Decreased strength, Obesity  Visit Diagnosis: Pain in right hip  Chronic midline low back pain with left-sided sciatica  Difficulty in walking, not elsewhere classified     Problem List Patient Active Problem List   Diagnosis Date Noted  . High risk medication use 12/07/2016  . PMR (polymyalgia rheumatica) (HCC) 09/27/2016  . DDD (degenerative disc disease), lumbar 09/27/2016  . HTN (hypertension) 06/05/2016  . Chronic pain syndrome 06/05/2016  . Acute diverticulitis 06/02/2016  . Diverticulitis large intestine 06/02/2016  . Spinal stenosis of lumbar region 02/29/2016  . Joint pain 02/29/2016  . Endometrial polyp 02/06/2014  . Postmenopausal bleeding 01/30/2014  . GERD (gastroesophageal reflux disease) 09/29/2013  . Spinal stenosis, thoracic 09/29/2013  . Shingles 09/29/2013  . Thoracic or lumbosacral neuritis or radiculitis, unspecified 05/04/2011  . Abnormality of gait 05/04/2011  . Muscle weakness (generalized) 05/04/2011  . KNEE, ARTHRITIS, DEGEN./OSTEO  04/07/2009  . KNEE PAIN 04/07/2009    2:36 PM, 12/13/16 [name], PT, DPT Physical Therapist at [place] [phone] (office)     [place] [address] [place], [place], [address] Phone: [phone]   Fax:  [phone]  Name: Nicole [name] MRN: [id number] Date of Birth: 09/17/1946

## 2016-12-15 ENCOUNTER — Ambulatory Visit (HOSPITAL_COMMUNITY): Payer: Medicare Other

## 2016-12-15 DIAGNOSIS — Z6841 Body Mass Index (BMI) 40.0 and over, adult: Secondary | ICD-10-CM | POA: Diagnosis not present

## 2016-12-15 DIAGNOSIS — M353 Polymyalgia rheumatica: Secondary | ICD-10-CM | POA: Diagnosis not present

## 2016-12-15 DIAGNOSIS — Z1389 Encounter for screening for other disorder: Secondary | ICD-10-CM | POA: Diagnosis not present

## 2016-12-15 DIAGNOSIS — R81 Glycosuria: Secondary | ICD-10-CM | POA: Diagnosis not present

## 2016-12-15 DIAGNOSIS — G894 Chronic pain syndrome: Secondary | ICD-10-CM | POA: Diagnosis not present

## 2016-12-15 DIAGNOSIS — H6501 Acute serous otitis media, right ear: Secondary | ICD-10-CM | POA: Diagnosis not present

## 2016-12-15 DIAGNOSIS — J329 Chronic sinusitis, unspecified: Secondary | ICD-10-CM | POA: Diagnosis not present

## 2016-12-15 DIAGNOSIS — N3000 Acute cystitis without hematuria: Secondary | ICD-10-CM | POA: Diagnosis not present

## 2016-12-18 ENCOUNTER — Ambulatory Visit (HOSPITAL_COMMUNITY): Payer: Medicare Other

## 2016-12-20 ENCOUNTER — Ambulatory Visit (HOSPITAL_COMMUNITY): Payer: Medicare Other

## 2016-12-20 DIAGNOSIS — R262 Difficulty in walking, not elsewhere classified: Secondary | ICD-10-CM

## 2016-12-20 DIAGNOSIS — M25551 Pain in right hip: Secondary | ICD-10-CM | POA: Diagnosis not present

## 2016-12-20 DIAGNOSIS — M5442 Lumbago with sciatica, left side: Secondary | ICD-10-CM

## 2016-12-20 DIAGNOSIS — G8929 Other chronic pain: Secondary | ICD-10-CM | POA: Diagnosis not present

## 2016-12-20 NOTE — Therapy (Signed)
PHYSICAL THERAPY DISCHARGE SUMMARY  Visits from Start of Care: 25  Current functional level related to goals / functional outcomes: *see below    Remaining deficits: *see below    Education / Equipment: *see below  Plan: Patient agrees to discharge.  Patient goals were met. Patient is being discharged due to meeting the stated rehab goals.  ?????         11:36 AM, 12/20/16 Nicole [name], PT, DPT Physical Therapist at [place] [phone] (office)            [name] [address] [place], [place], [address] Phone: [phone]   Fax:  [phone]  Physical Therapy Treatment  Patient Details  Name: Nicole [name] MRN: [id number] Date of Birth: 1946/09/06 Referring Provider: Cy [name]  Encounter Date: 12/20/2016      PT End of Session - 12/20/16 1126    Visit Number 25   Number of Visits 25   Authorization Type Medicare (G-codes done 15th session)   Authorization Time Period 09/20/16- 11/01/16; 11/03/16-12/15/16; 2/28 only   Authorization - Visit Number 25   Authorization - Number of Visits 27   PT Start Time 1036   PT Stop Time 1118   PT Time Calculation (min) 42 min   Activity Tolerance Patient tolerated treatment well;Patient limited by fatigue;Patient limited by pain   Behavior During Therapy [facility] for tasks assessed/performed      Past Medical History:  Diagnosis Date  . AC (acromioclavicular) joint bone spurs    lt shoulder  . Anemia   . Arthritis   . Carpal tunnel syndrome, bilateral   . Gastroesophageal reflux   . Headache    recent visit to ER @ [name] for severe headache  . Hypertension   . Lumbar stenosis    Hx of ESIs by Dr. Nelva Bush  . Polyarthralgia   . Polymyalgia ([place])   . Shingles   . Spinal stenosis     Past Surgical History:  Procedure Laterality Date  . CHOLECYSTECTOMY    . COLONOSCOPY  06/11/2012   Procedure: COLONOSCOPY;  Surgeon: Nicole So, MD;  [facility];  Service: Gastroenterology;  Laterality: N/A;  . HYSTEROSCOPY W/D&C N/A 02/25/2014   Procedure: DILATATION AND CURETTAGE /HYSTEROSCOPY;  Surgeon: Nicole Buff, MD;  Location: [facility];  Service: Gynecology;  Laterality: N/A;  . POLYPECTOMY N/A 02/25/2014   Procedure: POLYPECTOMY;  Surgeon: Nicole Buff, MD;  Location: [facility];  Service: Gynecology;  Laterality: N/A;  . RESECTION DISTAL CLAVICAL Right 03/26/2015   Procedure: OPEN DISTAL CLAVICAL RESECTION ;  Surgeon: Nicole Cedars, MD;  Location: [facility];  Service: Orthopedics;  Laterality: Right;    There were no vitals filed for this visit.      Subjective Assessment - 12/20/16 1039    Subjective Pt reports she feels better today. She was somewhat under the weather over the weekend with lots of pain and swelling in legs. MD put her on ABX. Pt reports she is waiting to hear back from [facility] regarding AFO fitting. She has not made it to [facility] for compression hose yet either. HEP is still going well, but she did take some time of while sick.     Pertinent History Pt had PT at this location for a related problem in 2012, with good resolution inititally, but January 2017 came back and gradually getting worse. Spinal stenosis diagnosed. Pt reports her weight has been  stable for most of her adult years. She has needed a RW since ~March due to progressive weakness, previosuly SPC intermittently for years. Foot drop reported to become more noticeable this year also.    How long can you sit comfortably? Not limited   How long can you walk comfortably? 53f bursts in clinic; at evaluation; pt able to AMB >225' at reassessment, but requires 2-3 standing rest breaks    Diagnostic tests Medical imaging.    Patient Stated Goals Return to walking and being more independent with ADL, meals. About 2 years, pt had no limitations walking for prolonged.    Currently in Pain? Yes   Pain Score 5   central lumbar spine area    Pain Type Chronic pain   Pain Onset More than a month ago   Aggravating Factors  sudden sharp movements, proglonged standing.    Pain Relieving Factors sitting    Multiple Pain Sites Yes            [facility] PT Assessment - 12/20/16 0001      Assessment   Medical Diagnosis Lumbar spinal stenosis with neurogenic claudication   Referring Provider Nicole [name]   Onset Date/Surgical Date 10/24/15  estiamted   Prior Therapy 2012 Out patient PT, and HHPT October 2017     [facility]   Living Arrangements Alone   Available Help at Discharge Family;Friend(s)  daughter and brother assist with IADL   Type of HWellstonto enter  2 partial steps   Entrance Stairs-Number of Steps 2   Entrance Stairs-Rails Can reach both   HKalifornskyOne level   [employer]- 2 wheels;Walker - 4 wheels;Cane - single point  would benefit from shower chair     Prior Function   Level of Independence Independent with household mobility with device;Needs assistance with ADLs   Toileting Supervision/set-up   Dressing Supervision/set-up   Leisure Pt reports she fees ore ease and independence with these at home.      Strength   Right Hip Flexion 5/5  4-/5 at reassessent    Right Hip External Rotation  5/5  4-/5 at reassessent    Right Hip Internal Rotation 5/5  4-/5 at reassessent    Right Hip ABduction 5/5  (seated, hips flexed to 90 degrees; was 4/5 at eval)   Left Hip External Rotation 4-/5  3+/5 at reassessment   Left Hip Internal Rotation 4-/5  3+/5 at reassessment   Left Hip ABduction 4+/5  (seated, hips flexed to 90 degrees; was 4/5 at eval)   Right Knee Flexion 4+/5  (3+/5 at eval)   Right Knee Extension 5/5  4/5 at reassessent    Left Knee Flexion 4-/5  (3-/5 at eval)   Left Knee Extension 5/5  (3-/5 at eval)     Transfers   Five time sit to stand comments  11.6s hands free  60+s with rollator at  evaluation; 14/5s at reassessment                     [facility] - [facility] 0001      Ambulation/Gait   Ambulation Distance (Feet) 225 Feet  continuous without pausing   Gait velocity 0.676m   Pre-Gait Activities Lateral side step at counter, 2x3f54f bilat, towels under feet.      Lumbar Exercises: Seated   Sit to Stand 5 reps  11.6s hands free  PT Education - 12/20/16 1123    Education provided Yes   Education Details discussed in detail updated HEP with some performance as needed.    Person(s) Educated Patient   Methods Explanation;Demonstration   Comprehension Verbalized understanding;Returned demonstration          PT Short Term Goals - 12/20/16 1057      PT SHORT TERM GOAL #1   Title After 3 weeks patient will demonstrate improved strength AEB 5xSTS s AD in <30 seconds.    Baseline 11.6s at DC; performs 4x in 66s, UE support on legs only at reassessment; at 6 weeks, performs 5xSTS in 19s. ;      PT SHORT TERM GOAL #2   Title After 3 weeks pt will demonstrate improved tolerance to AMB able to sustain 6MWT at speed >0.35ms.    Baseline at 6 weeks maximal gait speed is 0.433m    Status Achieved     PT SHORT TERM GOAL #3   Title After 3 weeks pt will report three ways to manage symptoms at home and demonstrate independence in a starter HEP established by PT.    Baseline Pt still limited in self management of symptoms; some HEP activities are too difficult to perform indep.    Status Achieved           PT Long Term Goals - 12/20/16 1059      PT LONG TERM GOAL #1   Title After 6 weeks patient will demonstrate improved strength AEB 5xSTS s AD in <20 seconds.    Baseline 11.6 s at DC; 19.4sec s AD  on 1/12; 14.6s on 2/7   Status Achieved     PT LONG TERM GOAL #2   Title After 6 weeks pt will demonstrate improved tolerance to AMB able to sustain 6MWT at speed >0.6022m   Baseline At DC able to walk  225f33fstain AMB flexed on walker _0 .48m/9mistance limited by worsenign pain in hip) This is longest patient has walked since starting PT (~2 minutes)      PT LONG TERM GOAL #3   Title After 6 weeks pt will demonstrate independence in an advanced HEP established by PT.    Status Achieved     PT LONG TERM GOAL #4   Title After 6 weeks pt will demonstrate improved strength in BLE AEB 4/5 strength in all BLE groups tested at evaluation.    Baseline Continued improvement  overall, with continued LLE > RLE weakness. Many  areas now 5/5.    Status Partially Met               Plan - 12/20/16 1127    Clinical Impression Statement Reassessment and DC performed today. The patient demonstrate substantial improvements in isolated strength assessment AEB MMT, as well as functional stregth testing AEB 5xSTS, tolerance to activity and simple mobility (AMB inititally limited to 20ft 61fsts to 2225ft a21f) and overall decrease in pain. Pt maintains less constant pain in back and large decrease in sharp sudden pain in back with instability. HEP is updated extensively to focus on contiual improvement at home irelated to remaining deficits. Pt is ready for DC at this time with continued defcitis, but is educated on how to continue her progress independently, and encrouaged to consider reevaluation in 2019 to7035ertain progress.    Rehab Potential Good   PT Treatment/Interventions Therapeutic exercise;Therapeutic activities;Functional mobility training;Stair training;Gait training;Balance training;Cognitive remediation;Patient/family education;Moist Heat;Passive range of motion;Dry needling;Energy conservation;Electrical Stimulation;Aquatic Therapy;Manual techniques;ADLs/Self  Care Home Management;DME Instruction;Neuromuscular re-education   Consulted and Agree with Plan of Care Patient      Patient will benefit from skilled therapeutic intervention in order to improve the following deficits and  impairments:  Abnormal gait, Impaired sensation, Improper body mechanics, Pain, Decreased activity tolerance, Difficulty walking, Decreased strength, Obesity  Visit Diagnosis: Pain in right hip - Plan: PT plan of care cert/re-cert  Chronic midline low back pain with left-sided sciatica - Plan: PT plan of care cert/re-cert  Difficulty in walking, not elsewhere classified - Plan: PT plan of care cert/re-cert       G-Codes - Jan 14, 2017 1131    Functional Assessment Tool Used (Outpatient Only) Clinical judgment   Functional Limitation Mobility: Walking and moving around   Mobility: Walking and Moving Around Goal Status [phone]) At least 40 percent but less than 60 percent impaired, limited or restricted   Mobility: Walking and Moving Around Discharge Status [id number]) At least 40 percent but less than 60 percent impaired, limited or restricted      Problem List Patient Active Problem List   Diagnosis Date Noted  . High risk medication use 12/07/2016  . PMR (polymyalgia rheumatica) (HCC) 09/27/2016  . DDD (degenerative disc disease), lumbar 09/27/2016  . HTN (hypertension) 06/05/2016  . Chronic pain syndrome 06/05/2016  . Acute diverticulitis 06/02/2016  . Diverticulitis large intestine 06/02/2016  . Spinal stenosis of lumbar region 02/29/2016  . Joint pain 02/29/2016  . Endometrial polyp 02/06/2014  . Postmenopausal bleeding 01/30/2014  . GERD (gastroesophageal reflux disease) 09/29/2013  . Spinal stenosis, thoracic 09/29/2013  . Shingles 09/29/2013  . Thoracic or lumbosacral neuritis or radiculitis, unspecified 05/04/2011  . Abnormality of gait 05/04/2011  . Muscle weakness (generalized) 05/04/2011  . KNEE, ARTHRITIS, DEGEN./OSTEO 04/07/2009  . KNEE PAIN 04/07/2009   11:37 AM, 01-14-2017 Nicole [name], PT, DPT Physical Therapist at [facility] [phone] (office)     [place], [place], [address] Phone: [phone]   Fax:  [phone]  Name: Tasnia Spegal MRN: [id number] Date of Birth: 10-14-1946

## 2016-12-20 NOTE — Progress Notes (Signed)
Office Visit Note  Patient: Nicole [name]             Date of Birth: 02-08-1946           MRN: [id number]             PCP: Purvis Kilts, MD Referring: Sharilyn Sites, MD Visit Date: 12/29/2016 Occupation: '@GUAROCC' @    Subjective:  Follow-up Polymyalgia rheumatica, Plaquenil, prednisone  History of Present Illness: Nicole [name] is a 71 y.o. female  Last seen 09/29/2016   Patient is doing relatively well with her polymyalgia rheumatica. She is able to successfully use prednisone 4 mg since the last visit.  She followed up with her spinal specialist at [place] this morning. She saw Levy Pupa, PA who works with Dr. Dossie Der. Patient's medicines were out and she needed to be refilled and the office could not refill them until they had office visit with them and that was taking care of this morning before our appointment today.  Patient is doing well with her Plaquenil for her polymyalgia rheumatica. She's currently taking 200 mg in the morning and 100. She is tolerating that well. Her Plaquenil eye exam through Dr. Kellie Moor office was normal as a 12/07/2016. See Epic for the report. It is in the media tab.   Activities of Daily Living:  Patient reports morning stiffness for 30 minutes.   Patient Reports nocturnal pain.  Difficulty dressing/grooming: Reports Difficulty climbing stairs: Reports Difficulty getting out of chair: Reports Difficulty using hands for taps, buttons, cutlery, and/or writing: Reports   No Rheumatology ROS completed.   PMFS History:  Patient Active Problem List   Diagnosis Date Noted  . High risk medication use 12/07/2016  . PMR (polymyalgia rheumatica) (HCC) 09/27/2016  . DDD (degenerative disc disease), lumbar 09/27/2016  . HTN (hypertension) 06/05/2016  . Chronic pain syndrome 06/05/2016  . Acute diverticulitis 06/02/2016  . Diverticulitis large intestine 06/02/2016  . Spinal stenosis of lumbar region 02/29/2016  . Joint  pain 02/29/2016  . Endometrial polyp 02/06/2014  . Postmenopausal bleeding 01/30/2014  . GERD (gastroesophageal reflux disease) 09/29/2013  . Spinal stenosis, thoracic 09/29/2013  . Shingles 09/29/2013  . Thoracic or lumbosacral neuritis or radiculitis, unspecified 05/04/2011  . Abnormality of gait 05/04/2011  . Muscle weakness (generalized) 05/04/2011  . KNEE, ARTHRITIS, DEGEN./OSTEO 04/07/2009  . KNEE PAIN 04/07/2009    Past Medical History:  Diagnosis Date  . AC (acromioclavicular) joint bone spurs    lt shoulder  . Anemia   . Arthritis   . Carpal tunnel syndrome, bilateral   . Gastroesophageal reflux   . Headache    recent visit to ER @ [facility] for severe headache  . Hypertension   . Lumbar stenosis    Hx of ESIs by Dr. Nelva Bush  . Polyarthralgia   . Polymyalgia ([place])   . Shingles   . Spinal stenosis     Family History  Problem Relation Age of Onset  . Hypertension Mother   . Pneumonia Father    Past Surgical History:  Procedure Laterality Date  . CHOLECYSTECTOMY    . COLONOSCOPY  06/11/2012   Procedure: COLONOSCOPY;  Surgeon: Jamesetta So, MD;  Location: [facility];  Service: Gastroenterology;  Laterality: N/A;  . HYSTEROSCOPY W/D&C N/A 02/25/2014   Procedure: DILATATION AND CURETTAGE /HYSTEROSCOPY;  Surgeon: Florian Buff, MD;  Location: [facility];  Service: Gynecology;  Laterality: N/A;  . POLYPECTOMY N/A 02/25/2014   Procedure: POLYPECTOMY;  Surgeon: Florian Buff,  MD;  Location: [facility];  Service: Gynecology;  Laterality: N/A;  . RESECTION DISTAL CLAVICAL Right 03/26/2015   Procedure: OPEN DISTAL CLAVICAL RESECTION ;  Surgeon: Netta Cedars, MD;  Location: [place];  Service: Orthopedics;  Laterality: Right;   Social History   Social History Narrative   Lives at home alone.   Right-handed.   1-2 cups caffeine daily.     Objective: Vital Signs: BP (!) 149/82   Pulse 82   Resp 13   Ht 5' (1.524 m)   Wt 256 lb (116.1 kg)   BMI 50.00 kg/m    Physical Exam     Musculoskeletal Exam:  Full range of motion of all joints Grip strength is equal and strong bilaterally Fiber myalgia tender points are all absent  Note patient's PMR is doing well. She has about 3+ to 4+ strength in the upper and lower extremity to resistance. There is a lot of deconditioning going on with this patient and she is working now with physical therapy to recover some of her deconditioning. She is making slow but steady progress forward  CDAI Exam: No CDAI exam completed.  No synovitis on exam  Investigation: Findings:  Labs from 06/28/2016 show CBC with diff is normal except for the platelets elevated at 386.  CMP with GFR shows increased creatinine at 1.11 and GFR at 58, but those are close to normal limits and better than the last time.  She does have known chronic kidney disease.  Sed rate is improved.  It is 36 on the last lab.  On 04/20/2016 her sed rate was 65.   April 20, 2016 shows CMP with GFR was elevated, creatinine 1.21, decreased GFR 53.  CBC with diff is normal.  Sed rate is elevated at 65.  CCP is elevated at 43.  Hep panel is negative.  TSH is normal.  IgG and IgM are normal.  IgM is elevated at 333.  SPEP is negative.   Office Visit on 09/29/2016  Component Date Value Ref Range Status  . WBC 09/29/2016 7.2  3.8 - 10.8 K/uL Final  . RBC 09/29/2016 4.08  3.80 - 5.10 MIL/uL Final  . Hemoglobin 09/29/2016 11.6* 11.7 - 15.5 g/dL Final  . HCT 09/29/2016 35.4  35.0 - 45.0 % Final  . MCV 09/29/2016 86.8  80.0 - 100.0 fL Final  . MCH 09/29/2016 28.4  27.0 - 33.0 pg Final  . MCHC 09/29/2016 32.8  32.0 - 36.0 g/dL Final  . RDW 09/29/2016 15.3* 11.0 - 15.0 % Final  . Platelets 09/29/2016 301  140 - 400 K/uL Final  . MPV 09/29/2016 8.9  7.5 - 12.5 fL Final  . Neutro Abs 09/29/2016 3816  1,500 - 7,800 cells/uL Final  . Lymphs Abs 09/29/2016 2520  850 - 3,900 cells/uL Final  . Monocytes Absolute 09/29/2016 576  200 - 950 cells/uL Final  . Eosinophils Absolute  09/29/2016 288  15 - 500 cells/uL Final  . Basophils Absolute 09/29/2016 0  0 - 200 cells/uL Final  . Neutrophils Relative % 09/29/2016 53  % Final  . Lymphocytes Relative 09/29/2016 35  % Final  . Monocytes Relative 09/29/2016 8  % Final  . Eosinophils Relative 09/29/2016 4  % Final  . Basophils Relative 09/29/2016 0  % Final  . Smear Review 09/29/2016 Criteria for review not met   Final  . Sodium 09/29/2016 143  135 - 146 mmol/L Final  . Potassium 09/29/2016 4.2  3.5 - 5.3 mmol/L Final  .  Chloride 09/29/2016 105  98 - 110 mmol/L Final  . CO2 09/29/2016 27  20 - 31 mmol/L Final  . Glucose, Bld 09/29/2016 90  65 - 99 mg/dL Final  . BUN 09/29/2016 22  7 - 25 mg/dL Final  . Creat 09/29/2016 1.36* 0.60 - 0.93 mg/dL Final   Comment:   For patients > or = 71 years of age: The upper reference limit for Creatinine is approximately 13% higher for people identified as African-American.     . Total Bilirubin 09/29/2016 0.4  0.2 - 1.2 mg/dL Final  . Alkaline Phosphatase 09/29/2016 87  33 - 130 U/L Final  . AST 09/29/2016 14  10 - 35 U/L Final  . ALT 09/29/2016 15  6 - 29 U/L Final  . Total Protein 09/29/2016 6.5  6.1 - 8.1 g/dL Final  . Albumin 09/29/2016 3.5* 3.6 - 5.1 g/dL Final  . Calcium 09/29/2016 9.1  8.6 - 10.4 mg/dL Final  . GFR, Est African American 09/29/2016 45* >=60 mL/min Final  . GFR, Est Non African American 09/29/2016 39* >=60 mL/min Final  . Sed Rate 09/29/2016 53* 0 - 30 mm/hr Final  Orders Only on 09/06/2016  Component Date Value Ref Range Status  . Glucose 09/06/2016 67  65 - 99 mg/dL Final  . BUN 09/06/2016 21  8 - 27 mg/dL Final  . Creatinine, Ser 09/06/2016 1.17* 0.57 - 1.00 mg/dL Final  . GFR calc non Af Amer 09/06/2016 47* >59 mL/min/1.73 Final  . GFR calc Af Amer 09/06/2016 55* >59 mL/min/1.73 Final  . BUN/Creatinine Ratio 09/06/2016 18  12 - 28 Final  . Sodium 09/06/2016 142  134 - 144 mmol/L Final  . Potassium 09/06/2016 4.3  3.5 - 5.2 mmol/L Final  .  Chloride 09/06/2016 100  96 - 106 mmol/L Final  . CO2 09/06/2016 27  18 - 29 mmol/L Final  . Calcium 09/06/2016 9.6  8.7 - 10.3 mg/dL Final  . Total Protein 09/06/2016 7.0  6.0 - 8.5 g/dL Final  . Albumin 09/06/2016 4.1  3.5 - 4.8 g/dL Final  . Globulin, Total 09/06/2016 2.9  1.5 - 4.5 g/dL Final  . Albumin/Globulin Ratio 09/06/2016 1.4  1.2 - 2.2 Final  . Bilirubin Total 09/06/2016 0.3  0.0 - 1.2 mg/dL Final  . Alkaline Phosphatase 09/06/2016 119* 39 - 117 IU/L Final  . AST 09/06/2016 21  0 - 40 IU/L Final  . ALT 09/06/2016 21  0 - 32 IU/L Final  . WBC 09/06/2016 10.4  3.4 - 10.8 x10E3/uL Final  . RBC 09/06/2016 4.14  3.77 - 5.28 x10E6/uL Final  . Hemoglobin 09/06/2016 11.9  11.1 - 15.9 g/dL Final  . Hematocrit 09/06/2016 35.8  34.0 - 46.6 % Final  . MCV 09/06/2016 87  79 - 97 fL Final  . MCH 09/06/2016 28.7  26.6 - 33.0 pg Final  . MCHC 09/06/2016 33.2  31.5 - 35.7 g/dL Final  . RDW 09/06/2016 15.2  12.3 - 15.4 % Final  . Platelets 09/06/2016 321  150 - 379 x10E3/uL Final  . Neutrophils 09/06/2016 50  Not Estab. % Final  . Lymphs 09/06/2016 40  Not Estab. % Final  . Monocytes 09/06/2016 7  Not Estab. % Final  . Eos 09/06/2016 3  Not Estab. % Final  . Basos 09/06/2016 0  Not Estab. % Final  . Neutrophils Absolute 09/06/2016 5.1  1.4 - 7.0 x10E3/uL Final  . Lymphocytes Absolute 09/06/2016 4.2* 0.7 - 3.1 x10E3/uL Final  . Monocytes Absolute  09/06/2016 0.8  0.1 - 0.9 x10E3/uL Final  . EOS (ABSOLUTE) 09/06/2016 0.3  0.0 - 0.4 x10E3/uL Final  . Basophils Absolute 09/06/2016 0.0  0.0 - 0.2 x10E3/uL Final  . Immature Granulocytes 09/06/2016 0  Not Estab. % Final  . Immature Grans (Abs) 09/06/2016 0.0  0.0 - 0.1 x10E3/uL Final      Imaging: No results found.  Speciality Comments: No specialty comments available.    Procedures:  No procedures performed Allergies: Oxycontin [oxycodone hcl]; Sulfa antibiotics; Tramadol; and Penicillins   Assessment / Plan:     Visit Diagnoses:  PMR (polymyalgia rheumatica) (HCC)  Muscle weakness (generalized)  Arthralgia of both lower legs  High risk medication use - 12/29/2016==> plq 200 mg bid m-f ; prednisone 54m will now be 355mdaily starting January 10, 2017 for 3 months //  DDD (degenerative disc disease), lumbar  History of hypertension  History of gastroesophageal reflux (GERD)  History of diverticulitis  History of chronic kidney disease   Plan: #1: Polymyalgia rheumatica.Doing well. No flare. 3+ to 4+ strength bilaterally in upper and lower extremity. Patient is deconditioned. Please see  #2: Plaque 200 mg in the morning and 100 mg at night currently. Patient is tolerating that medication well. We can ask her to change the way she takes the medicine as follows: A: She'll take 200 mg in the morning and 200 mg in the evening only Monday through Friday. None on Saturday and none on Sunday. I'll rewrite the prescription to REFLECT that. Plaquenil eye exam was done through Dr. ShKellie Moorffice 12/07/2016. We have her report in Epic. Patient has no retinopathy per Dr. ShKellie Moorxam.  #3: She is currently on prednisone 4 mg. She was successfully able to taper from 5 mg to 4 mg and has not had any flares as a result. I've asked her if she feels good enough to go down to 3 mg and she is agreeable. She will do 3 mg  #4: Patient will return to clinic in 3 months I can monitor how well she is doing  Orders: No orders of the defined types were placed in this encounter.  No orders of the defined types were placed in this encounter.   Face-to-face time spent with patient was 30 minutes. 50% of time was spent in counseling and coordination of care.  Follow-Up Instructions: No Follow-up on file.   NaEliezer LoftsPA-C  Note - This record has been created using [employer] Chart creation errors have been sought, but may not always  have been located. Such creation errors do not reflect on  the standard of  medical care.

## 2016-12-29 ENCOUNTER — Encounter: Payer: Self-pay | Admitting: Rheumatology

## 2016-12-29 ENCOUNTER — Ambulatory Visit (INDEPENDENT_AMBULATORY_CARE_PROVIDER_SITE_OTHER): Payer: Medicare Other | Admitting: Rheumatology

## 2016-12-29 VITALS — BP 149/82 | HR 82 | Resp 13 | Ht 60.0 in | Wt 256.0 lb

## 2016-12-29 DIAGNOSIS — Z87448 Personal history of other diseases of urinary system: Secondary | ICD-10-CM | POA: Diagnosis not present

## 2016-12-29 DIAGNOSIS — M810 Age-related osteoporosis without current pathological fracture: Secondary | ICD-10-CM | POA: Diagnosis not present

## 2016-12-29 DIAGNOSIS — M6281 Muscle weakness (generalized): Secondary | ICD-10-CM

## 2016-12-29 DIAGNOSIS — Z8719 Personal history of other diseases of the digestive system: Secondary | ICD-10-CM

## 2016-12-29 DIAGNOSIS — M25562 Pain in left knee: Secondary | ICD-10-CM

## 2016-12-29 DIAGNOSIS — M353 Polymyalgia rheumatica: Secondary | ICD-10-CM

## 2016-12-29 DIAGNOSIS — R5382 Chronic fatigue, unspecified: Secondary | ICD-10-CM | POA: Diagnosis not present

## 2016-12-29 DIAGNOSIS — Z79899 Other long term (current) drug therapy: Secondary | ICD-10-CM

## 2016-12-29 DIAGNOSIS — M5136 Other intervertebral disc degeneration, lumbar region: Secondary | ICD-10-CM | POA: Diagnosis not present

## 2016-12-29 DIAGNOSIS — Z8679 Personal history of other diseases of the circulatory system: Secondary | ICD-10-CM

## 2016-12-29 DIAGNOSIS — G894 Chronic pain syndrome: Secondary | ICD-10-CM | POA: Diagnosis not present

## 2016-12-29 DIAGNOSIS — M25561 Pain in right knee: Secondary | ICD-10-CM

## 2016-12-29 LAB — CBC WITH DIFFERENTIAL/PLATELET
Basophils Absolute: 0 cells/uL (ref 0–200)
Basophils Relative: 0 %
Eosinophils Absolute: 270 cells/uL (ref 15–500)
Eosinophils Relative: 3 %
HCT: 35.5 % (ref 35.0–45.0)
Hemoglobin: 11.7 g/dL (ref 11.7–15.5)
Lymphocytes Relative: 31 %
Lymphs Abs: 2790 cells/uL (ref 850–3900)
MCH: 28.9 pg (ref 27.0–33.0)
MCHC: 33 g/dL (ref 32.0–36.0)
MCV: 87.7 fL (ref 80.0–100.0)
MPV: 8.9 fL (ref 7.5–12.5)
Monocytes Absolute: 630 cells/uL (ref 200–950)
Monocytes Relative: 7 %
Neutro Abs: 5310 cells/uL (ref 1500–7800)
Neutrophils Relative %: 59 %
Platelets: 303 10*3/uL (ref 140–400)
RBC: 4.05 MIL/uL (ref 3.80–5.10)
RDW: 14.9 % (ref 11.0–15.0)
WBC: 9 10*3/uL (ref 3.8–10.8)

## 2016-12-29 LAB — COMPLETE METABOLIC PANEL WITH GFR
ALT: 16 U/L (ref 6–29)
AST: 16 U/L (ref 10–35)
Albumin: 3.4 g/dL — ABNORMAL LOW (ref 3.6–5.1)
Alkaline Phosphatase: 121 U/L (ref 33–130)
BUN: 20 mg/dL (ref 7–25)
CO2: 27 mmol/L (ref 20–31)
Calcium: 9 mg/dL (ref 8.6–10.4)
Chloride: 107 mmol/L (ref 98–110)
Creat: 1.34 mg/dL — ABNORMAL HIGH (ref 0.60–0.93)
GFR, Est African American: 46 mL/min — ABNORMAL LOW (ref >=60)
GFR, Est Non African American: 40 mL/min — ABNORMAL LOW (ref >=60)
Glucose, Bld: 77 mg/dL (ref 65–99)
Potassium: 4.3 mmol/L (ref 3.5–5.3)
Sodium: 143 mmol/L (ref 135–146)
Total Bilirubin: 0.4 mg/dL (ref 0.2–1.2)
Total Protein: 6.4 g/dL (ref 6.1–8.1)

## 2016-12-29 MED ORDER — PREDNISONE 1 MG PO TABS: 3.0000 mg | ORAL_TABLET | Freq: Every day | ORAL | 0 refills | Status: DC

## 2016-12-29 MED ORDER — HYDROXYCHLOROQUINE SULFATE 200 MG PO TABS: ORAL_TABLET | ORAL | 1 refills | Status: DC

## 2016-12-30 LAB — VITAMIN D 25 HYDROXY (VIT D DEFICIENCY, FRACTURES): Vit D, 25-Hydroxy: 21 ng/mL — ABNORMAL LOW (ref 30–100)

## 2016-12-30 LAB — SEDIMENTATION RATE: Sed Rate: 66 mm/hr — ABNORMAL HIGH (ref 0–30)

## 2017-01-02 ENCOUNTER — Telehealth: Payer: Self-pay | Admitting: *Deleted

## 2017-01-02 MED ORDER — VITAMIN D (ERGOCALCIFEROL) 1.25 MG (50000 UNIT) PO CAPS: 50000.0000 [IU] | ORAL_CAPSULE | ORAL | 0 refills | Status: DC

## 2017-01-02 NOTE — Telephone Encounter (Signed)
-----   Message from Eliezer Lofts, [place] sent at 01/01/2017  8:48 AM EDT ----- Send copy of labs to PCP And tell patient #1: Vitamin D is low at 21. We will treat this. 50,000 IUs 2 times a week 12 weeks; dispense 24 pills with no refill Repeat vitamin D in 3 months. Please schedule this lab  #2: Sedimentation rate is elevated at 66. Consistent with labs done 3 months ago. We will monitor. Patient has history of polymyalgia rheumatica and is on prednisone already. Asked patient to take prednisone 5 mg every day starting tomorrow for the remainder of March. Then in April she'll do 4 mg. Then in May she'll continue 4 mg. Then in June she can come down to 3 mg. Please advise patient of this new prednisone schedule. It is different from the one that ice discussed in office 3 days ago see you may need to reinforce it for the patient.  #3: CMP with GFR is within normal limits except creatinine elevated at 1.34 (consistent with past labs and GFR low at 46[consistent with past labs])  #4: CBC with differential is normal

## 2017-03-14 ENCOUNTER — Other Ambulatory Visit: Payer: Self-pay | Admitting: Rheumatology

## 2017-03-14 NOTE — Telephone Encounter (Signed)
Last Visit: 12/29/16 Next Visit: 03/29/17  Okay to refill Prednisone?

## 2017-03-14 NOTE — Telephone Encounter (Signed)
ok 

## 2017-03-22 ENCOUNTER — Ambulatory Visit: Payer: Medicare Other | Admitting: Neurology

## 2017-03-29 ENCOUNTER — Ambulatory Visit (INDEPENDENT_AMBULATORY_CARE_PROVIDER_SITE_OTHER): Payer: Medicare Other | Admitting: Rheumatology

## 2017-03-29 ENCOUNTER — Encounter: Payer: Self-pay | Admitting: Rheumatology

## 2017-03-29 VITALS — BP 110/69 | HR 88 | Resp 14 | Ht 60.0 in | Wt 278.0 lb

## 2017-03-29 DIAGNOSIS — E669 Obesity, unspecified: Secondary | ICD-10-CM | POA: Diagnosis not present

## 2017-03-29 DIAGNOSIS — R5381 Other malaise: Secondary | ICD-10-CM

## 2017-03-29 DIAGNOSIS — IMO0001 Reserved for inherently not codable concepts without codable children: Secondary | ICD-10-CM

## 2017-03-29 DIAGNOSIS — Z6841 Body Mass Index (BMI) 40.0 and over, adult: Secondary | ICD-10-CM | POA: Diagnosis not present

## 2017-03-29 DIAGNOSIS — M353 Polymyalgia rheumatica: Secondary | ICD-10-CM

## 2017-03-29 DIAGNOSIS — Z79899 Other long term (current) drug therapy: Secondary | ICD-10-CM

## 2017-03-29 MED ORDER — PREDNISONE 1 MG PO TABS: 2.0000 mg | ORAL_TABLET | Freq: Every day | ORAL | 0 refills | Status: DC

## 2017-03-29 NOTE — Progress Notes (Signed)
Office Visit Note  Patient: Nicole [name]             Date of Birth: [birth year]           MRN: [id number]             PCP: Sharilyn Sites, MD Referring: Sharilyn Sites, MD Visit Date: 03/29/2017 Occupation: _0 @    Subjective:  No chief complaint on file.   History of Present Illness: Nicole [name] is a 71 y.o. female  Last seen December 29, 2016.  Pt doing well w/ PMR. She has been doing well w/ 80m which was started at last visit. She occaionally had some bad days at 3 mg but were only a few bad days over last 3 months.   Pt is ready to move down to 2 mg prednisone daily. We will do this dose for about 2-388monthand pt will be back in 10 weeks to taper down some more to prednisone 62m49maily.   Activities of Daily Living:  Patient reports morning stiffness for 30 minutes.   Patient Reports nocturnal pain.  Difficulty dressing/grooming: Reports Difficulty climbing stairs: Reports Difficulty getting out of chair: Reports Difficulty using hands for taps, buttons, cutlery, and/or writing: Reports   Review of Systems  Constitutional: Negative for fatigue.  HENT: Negative for mouth sores and mouth dryness.   Eyes: Negative for dryness.  Respiratory: Negative for shortness of breath.   Gastrointestinal: Negative for constipation and diarrhea.  Musculoskeletal: Negative for myalgias and myalgias.  Skin: Negative for sensitivity to sunlight.  Psychiatric/Behavioral: Negative for decreased concentration and sleep disturbance.    PMFS History:  Patient Active Problem List   Diagnosis Date Noted  . High risk medication use 12/07/2016  . PMR (polymyalgia rheumatica) (HCC) 09/27/2016  . DDD (degenerative disc disease), lumbar 09/27/2016  . HTN (hypertension) 06/05/2016  . Chronic pain syndrome 06/05/2016  . Acute diverticulitis 06/02/2016  . Diverticulitis large intestine 06/02/2016  . Spinal stenosis of lumbar region 02/29/2016  . Joint pain 02/29/2016  . Endometrial polyp  02/06/2014  . Postmenopausal bleeding 01/30/2014  . GERD (gastroesophageal reflux disease) 09/29/2013  . Spinal stenosis, thoracic 09/29/2013  . Shingles 09/29/2013  . Thoracic or lumbosacral neuritis or radiculitis, unspecified 05/04/2011  . Abnormality of gait 05/04/2011  . Muscle weakness (generalized) 05/04/2011  . KNEE, ARTHRITIS, DEGEN./OSTEO 04/07/2009  . KNEE PAIN 04/07/2009    Past Medical History:  Diagnosis Date  . AC (acromioclavicular) joint bone spurs    lt shoulder  . Anemia   . Arthritis   . Carpal tunnel syndrome, bilateral   . Gastroesophageal reflux   . Headache    recent visit to ER @ [facility] severe headache  . Hypertension   . Lumbar stenosis    Hx of ESIs by Dr. RamNelva Bush Polyarthralgia   . Polymyalgia ([facility] . Shingles   . Spinal stenosis     Family History  Problem Relation Age of Onset  . Hypertension Mother   . Pneumonia Father    Past Surgical History:  Procedure Laterality Date  . CHOLECYSTECTOMY    . COLONOSCOPY  06/11/2012   Procedure: COLONOSCOPY;  Surgeon: MarJamesetta SoD;  Location: [facility];  Service: Gastroenterology;  Laterality: N/A;  . HYSTEROSCOPY W/D&C N/A 02/25/2014   Procedure: DILATATION AND CURETTAGE /HYSTEROSCOPY;  Surgeon: LutFlorian BuffD;  Location: [facility];  Service: Gynecology;  Laterality: N/A;  . POLYPECTOMY N/A 02/25/2014   Procedure: POLYPECTOMY;  Surgeon:  Florian Buff, MD;  Location: [facility];  Service: Gynecology;  Laterality: N/A;  . RESECTION DISTAL CLAVICAL Right 03/26/2015   Procedure: OPEN DISTAL CLAVICAL RESECTION ;  Surgeon: Netta Cedars, MD;  Location: [facility];  Service: Orthopedics;  Laterality: Right;   Social History   Social History Narrative   Lives at home alone.   Right-handed.   1-2 cups caffeine daily.     Objective: Vital Signs: BP 110/69   Pulse 88   Resp 14   Ht 5' (1.524 m)   Wt 278 lb (126.1 kg)   BMI 54.29 kg/m    Physical Exam  Constitutional: She is oriented to  person, place, and time. She appears well-developed and well-nourished.  HENT:  Head: Normocephalic and atraumatic.  Eyes: EOM are normal. Pupils are equal, round, and reactive to light.  Cardiovascular: Normal rate, regular rhythm and normal heart sounds.  Exam reveals no gallop and no friction rub.   No murmur heard. Pulmonary/Chest: Effort normal and breath sounds normal. She has no wheezes. She has no rales.  Abdominal: Soft. Bowel sounds are normal. She exhibits no distension. There is no tenderness. There is no guarding. No hernia.  Musculoskeletal: Normal range of motion. She exhibits no edema, tenderness or deformity.  Lymphadenopathy:    She has no cervical adenopathy.  Neurological: She is alert and oriented to person, place, and time. Coordination normal.  Skin: Skin is warm and dry. Capillary refill takes less than 2 seconds. No rash noted.  Psychiatric: She has a normal mood and affect. Her behavior is normal.  Nursing note and vitals reviewed.    Musculoskeletal Exam:  Decreased range of motion of bilateral shoulder joints with only 90 of abduction Decreased grip strength bilaterally Fibromyalgia tender points are all absent I believe the patient has physical/muscular deconditioning. With great effort, she was able to sit up from a chair without any assistance. She has about 3+ muscle strength bilaterally to the upper and lower extremities. I believe her polymyalgia rheumatica is under control.  CDAI Exam: CDAI Homunculus Exam:   Joint Counts:  CDAI Tender Joint count: 0 CDAI Swollen Joint count: 0   No synovitis  Investigation: No additional findings.  Office Visit on 12/29/2016  Component Date Value Ref Range Status  . WBC 12/29/2016 9.0  3.8 - 10.8 K/uL Final  . RBC 12/29/2016 4.05  3.80 - 5.10 MIL/uL Final  . Hemoglobin 12/29/2016 11.7  11.7 - 15.5 g/dL Final  . HCT 12/29/2016 35.5  35.0 - 45.0 % Final  . MCV 12/29/2016 87.7  80.0 - 100.0 fL Final  .  MCH 12/29/2016 28.9  27.0 - 33.0 pg Final  . MCHC 12/29/2016 33.0  32.0 - 36.0 g/dL Final  . RDW 12/29/2016 14.9  11.0 - 15.0 % Final  . Platelets 12/29/2016 303  140 - 400 K/uL Final  . MPV 12/29/2016 8.9  7.5 - 12.5 fL Final  . Neutro Abs 12/29/2016 5310  1,500 - 7,800 cells/uL Final  . Lymphs Abs 12/29/2016 2790  850 - 3,900 cells/uL Final  . Monocytes Absolute 12/29/2016 630  200 - 950 cells/uL Final  . Eosinophils Absolute 12/29/2016 270  15 - 500 cells/uL Final  . Basophils Absolute 12/29/2016 0  0 - 200 cells/uL Final  . Neutrophils Relative % 12/29/2016 59  % Final  . Lymphocytes Relative 12/29/2016 31  % Final  . Monocytes Relative 12/29/2016 7  % Final  . Eosinophils Relative 12/29/2016 3  % Final  .  Basophils Relative 12/29/2016 0  % Final  . Smear Review 12/29/2016 Criteria for review not met   Final  . Sodium 12/29/2016 143  135 - 146 mmol/L Final  . Potassium 12/29/2016 4.3  3.5 - 5.3 mmol/L Final  . Chloride 12/29/2016 107  98 - 110 mmol/L Final  . CO2 12/29/2016 27  20 - 31 mmol/L Final  . Glucose, Bld 12/29/2016 77  65 - 99 mg/dL Final  . BUN 12/29/2016 20  7 - 25 mg/dL Final  . Creat 12/29/2016 1.34* 0.60 - 0.93 mg/dL Final   Comment:   For patients > or = 71 years of age: The upper reference limit for Creatinine is approximately 13% higher for people identified as African-American.     . Total Bilirubin 12/29/2016 0.4  0.2 - 1.2 mg/dL Final  . Alkaline Phosphatase 12/29/2016 121  33 - 130 U/L Final  . AST 12/29/2016 16  10 - 35 U/L Final  . ALT 12/29/2016 16  6 - 29 U/L Final  . Total Protein 12/29/2016 6.4  6.1 - 8.1 g/dL Final  . Albumin 12/29/2016 3.4* 3.6 - 5.1 g/dL Final  . Calcium 12/29/2016 9.0  8.6 - 10.4 mg/dL Final  . GFR, Est African American 12/29/2016 46* >=60 mL/min Final  . GFR, Est Non African American 12/29/2016 40* >=60 mL/min Final  . Vit D, 25-Hydroxy 12/29/2016 21* 30 - 100 ng/mL Final   Comment: Vitamin D Status           25-OH Vitamin  D        Deficiency                <20 ng/mL        Insufficiency         20 - 29 ng/mL        Optimal             > or = 30 ng/mL   For 25-OH Vitamin D testing on patients on D2-supplementation and patients for whom quantitation of D2 and D3 fractions is required, the [employer] 25-OH VIT D, (D2,D3), LC/MS/MS is recommended: order code [id number] (patients > 2 yrs).   . Sed Rate 12/29/2016 66* 0 - 30 mm/hr Final  Office Visit on 09/29/2016  Component Date Value Ref Range Status  . WBC 09/29/2016 7.2  3.8 - 10.8 K/uL Final  . RBC 09/29/2016 4.08  3.80 - 5.10 MIL/uL Final  . Hemoglobin 09/29/2016 11.6* 11.7 - 15.5 g/dL Final  . HCT 09/29/2016 35.4  35.0 - 45.0 % Final  . MCV 09/29/2016 86.8  80.0 - 100.0 fL Final  . MCH 09/29/2016 28.4  27.0 - 33.0 pg Final  . MCHC 09/29/2016 32.8  32.0 - 36.0 g/dL Final  . RDW 09/29/2016 15.3* 11.0 - 15.0 % Final  . Platelets 09/29/2016 301  140 - 400 K/uL Final  . MPV 09/29/2016 8.9  7.5 - 12.5 fL Final  . Neutro Abs 09/29/2016 3816  1,500 - 7,800 cells/uL Final  . Lymphs Abs 09/29/2016 2520  850 - 3,900 cells/uL Final  . Monocytes Absolute 09/29/2016 576  200 - 950 cells/uL Final  . Eosinophils Absolute 09/29/2016 288  15 - 500 cells/uL Final  . Basophils Absolute 09/29/2016 0  0 - 200 cells/uL Final  . Neutrophils Relative % 09/29/2016 53  % Final  . Lymphocytes Relative 09/29/2016 35  % Final  . Monocytes Relative 09/29/2016 8  % Final  . Eosinophils Relative 09/29/2016 4  %  Final  . Basophils Relative 09/29/2016 0  % Final  . Smear Review 09/29/2016 Criteria for review not met   Final  . Sodium 09/29/2016 143  135 - 146 mmol/L Final  . Potassium 09/29/2016 4.2  3.5 - 5.3 mmol/L Final  . Chloride 09/29/2016 105  98 - 110 mmol/L Final  . CO2 09/29/2016 27  20 - 31 mmol/L Final  . Glucose, Bld 09/29/2016 90  65 - 99 mg/dL Final  . BUN 09/29/2016 22  7 - 25 mg/dL Final  . Creat 09/29/2016 1.36* 0.60 - 0.93 mg/dL Final   Comment:   For patients  > or = 71 years of age: The upper reference limit for Creatinine is approximately 13% higher for people identified as African-American.     . Total Bilirubin 09/29/2016 0.4  0.2 - 1.2 mg/dL Final  . Alkaline Phosphatase 09/29/2016 87  33 - 130 U/L Final  . AST 09/29/2016 14  10 - 35 U/L Final  . ALT 09/29/2016 15  6 - 29 U/L Final  . Total Protein 09/29/2016 6.5  6.1 - 8.1 g/dL Final  . Albumin 09/29/2016 3.5* 3.6 - 5.1 g/dL Final  . Calcium 09/29/2016 9.1  8.6 - 10.4 mg/dL Final  . GFR, Est African American 09/29/2016 45* >=60 mL/min Final  . GFR, Est Non African American 09/29/2016 39* >=60 mL/min Final  . Sed Rate 09/29/2016 53* 0 - 30 mm/hr Final     Imaging: No results found.  Speciality Comments: No specialty comments available.  Procedures:  No procedures performed Allergies: Oxycontin [oxycodone hcl]; Sulfa antibiotics; Tramadol; and Penicillins   Assessment / Plan:     Visit Diagnoses: PMR (polymyalgia rheumatica) (HCC)  High risk medication use  Class 3 obesity with body mass index (BMI) of 50.0 to 59.9 in adult, unspecified obesity type, unspecified whether serious comorbidity present [facility])  Physical deconditioning   Plan #1: Polymyalgia rheumatica. Well-controlled at the moment. Taking Plaquenil 200 mg twice a day; M-F, adequate response Patient is taking prednisone and tapering down gradually; Patient did relatively well with lower dose of prednisone 3 mg every morning. Our plan is to do 2 mg for the next 12 weeks but bring her back in 10 weeks to see if she is tolerating that well. If so, we can do 1 mg every morning for about 2 months and then transition her to 1 mg every other day for about 2 months and then stop.  Of course, she will continue her Plaquenil 200 mg twice a day Monday through Friday  I plan to do CBC with differential, CMP with GFR, sedimentation rate at the next office visit in 10 weeks. That has been put into the computer.   Orders: No  orders of the defined types were placed in this encounter.  Meds ordered this encounter  Medications  . predniSONE (DELTASONE) 1 MG tablet    Sig: Take 2 tablets (2 mg total) by mouth daily with breakfast.    Dispense:  180 tablet    Refill:  0    Order Specific Question:   Supervising Provider    Answer:   Lyda Perone    Face-to-face time spent with patient was 30 minutes. 50% of time was spent in counseling and coordination of care.  Follow-Up Instructions: Return in about 10 weeks (around 06/07/2017) for PMR, obesity.   Eliezer Lofts, PA-C  Note - This record has been created using [employer].  Chart creation errors have been sought, but  may not always  have been located. Such creation errors do not reflect on  the standard of medical care.

## 2017-04-04 ENCOUNTER — Encounter: Payer: Self-pay | Admitting: Neurology

## 2017-04-04 ENCOUNTER — Ambulatory Visit (INDEPENDENT_AMBULATORY_CARE_PROVIDER_SITE_OTHER): Payer: Medicare Other | Admitting: Neurology

## 2017-04-04 VITALS — BP 109/70 | HR 83 | Ht 60.0 in | Wt 250.5 lb

## 2017-04-04 DIAGNOSIS — M48062 Spinal stenosis, lumbar region with neurogenic claudication: Secondary | ICD-10-CM | POA: Diagnosis not present

## 2017-04-04 DIAGNOSIS — R413 Other amnesia: Secondary | ICD-10-CM | POA: Insufficient documentation

## 2017-04-04 DIAGNOSIS — R269 Unspecified abnormalities of gait and mobility: Secondary | ICD-10-CM

## 2017-04-04 DIAGNOSIS — R799 Abnormal finding of blood chemistry, unspecified: Secondary | ICD-10-CM | POA: Diagnosis not present

## 2017-04-04 DIAGNOSIS — M353 Polymyalgia rheumatica: Secondary | ICD-10-CM

## 2017-04-04 MED ORDER — GABAPENTIN 100 MG PO CAPS: 200.0000 mg | ORAL_CAPSULE | Freq: Three times a day (TID) | ORAL | 4 refills | Status: DC

## 2017-04-04 NOTE — Progress Notes (Signed)
Chief Complaint  Patient presents with  . Rm New  . Follow-up    6 mo, accompanied by her grandson Oswaldo Milian)  . Spinal Stenosis    Continues gabapentin. Completed PT and felt that it was beneficial. Ambulating in stooped position, using rolling walker today. Pt says that [facility] did not contact her to schedule consult.      PATIENT: Sharlett Lienemann DOB: 19-Nov-1945  Chief Complaint  Patient presents with  . Rm New  . Follow-up    6 mo, accompanied by her grandson Oswaldo Milian)  . Spinal Stenosis    Continues gabapentin. Completed PT and felt that it was beneficial. Ambulating in stooped position, using rolling walker today. Pt says that [facility] did not contact her to schedule consult.     HISTORICAL  Terren Haberle is a 71 years old right-handed female, accompanied by her sister-in-law Hoyle Sauer, seen in refer by her primary care physician  Dr. Sharilyn Sites for evaluation of diffuse body achy pain, gait difficulty in Feb 29 2016.  I reviewed and summarized referring note, she had a history of hypertension, hyperlipidemia, osteoarthritis, history of chronic low back pain, lumbar stenosis, has been treated by pain management Dr. Nelva Bush since 2013  She had a history of chronic low back pain, progressively worse over the past few years, radiating pain to bilateral lower extremity, left worse than right, left leg give out underneath her, prior to December 2016, she was able to ambulate independently, but had rapid decline since, she complains of diffuse body achy pain, multiple joints pain, worsening gait difficulty, has to rely on her walker is to take few steps at her house,  She denies bowel and bladder incontinence, she also complains of worsening nocturia, she is now taking Nucynta 50 mg 3 times a day, with limited help of her pain, complains of drowsiness Per record, in April 2017, she was treated with methylprednisolone 4 mg tapering dose, diclofenac 75 mg twice a day, muscle  relaxant Flexeril 5 mg every night, patient reported limited help from those polypharmacy. Tramadol makes her confused, falling backwards  I reviewed laboratory evaluation in 2017, normal CBC, WBC 6.5, hemoglobin 13. 4, normal CMP, with exception of elevated creatinine 1.1 9, elevated LDL 177, cholesterol 266, triglycerides right 165, mildly low vitamin D 25.9, urine culture was negative,  rheumatoid factor was elevated 44.5, ANA was negative, Lyme titer was negative 0.9, hepatitis function showed mild elevated ALT 39, hepatitis panel A, B, C was negative, ionized calcium was normal 5.3, alkaline phosphate was high at 120, normal TSH 1.96, cortisone level was normal folic acid was normal 8.6, vitamin B12 597, PTH 48, esr rate was 56 elevated  I was able to review her MRI of lumbar in 2009 from [facility], severe lumbar stenosis at L 3-4, MRI of cervical spine multilevel cervical degenerative disc disease, no significant canal stenosis, no cord signal changes. MRI of the brain, mild atrophy, no acute lesions.  She lives alone, can barely maintaining independence because of her muscle pain, joints pain gait abnormality  UPDATE April 13 2016:  She came in for electrodiagnostic study today, which demonstrate active bilateral lumbar sacral radiculopathy. She has severe low back pain, limited range of motion, unsteady gait, at risk for fall,  she denies bowel and bladder incontinence,  I have referred her to neurosurgeon for lumbar decompression surgery,  UPDATE July 18th 2017: She was seen by Dr. Trenton Gammon in July, who recommended epidural injection by Dr. Nelva Bush, will try injection  first, if not responsive, may consider surgery,  She was offered to have lumbar decompression surgery in 2009, she decided to hold off then.    She now complains of severe low back pain, left side radiating pain, she has no bowel and bladder incontinence, she has significant abnormality,   We have personally reviewed MRI  lumbar in July 2017: showing severe spinal canal stenosis at L3-4 and L4-5 with bilateral severe foraminal narrowing at these levels with impingement on the exiting nerve roots. L1-L2 shows large disc osteophyte protrusion with severe foraminal compression on the right and encroachment on exiting nerve roots as well  UPDATE Nov 29th 2017: She continues to have significant low back pain, gait abnormality, we again reviewed MRI lumbar in July 2017, there is significant lumbar stenosis at L3-4, L4-5. She denies bowel and bladder incontinence, epidural injection in summer of 2017 only provide temporary relief.  She is also under the care of rheumatologist Dr. Patrecia Pour, taking prednisone 55m daily, per patient, will be switched to Plaquenil soon.  We have personally reviewed MRI of  MRI cervical spine in August 2017, multilevel degenerative disc disease, most severe C6-7, facet hypertrophy, bulging disc, moderate to severe bilateral foraminal stenosis.  UPDATE April 04 2017: Reviewed laboratory evaluation low vitamin D 21, CMP showed mild elevated creatinine 1.34, normal CBC, elevated ESR 66,  She was recently diagnosed with polymyalgia rheumatica, was treated with tapering dose of prednisone, now coming down to 2 mg daily,  She continue to complains of diffuse body achy pain, low back pain, significant gait abnormality, but had some improvement after physical therapy,  She could not tolerate high-dose of gabapentin 300 mg 3 times a day cause her falling backwards, she is only taking 100 mg 2 tablets 3 times a day  We have personally reviewed MRI of in July 2017, severe spinal stenosis at L3-4, L4-5, with bilateral severe foraminal narrowing, nerve compression, MRI of cervical spine showed multilevel degenerative disc disease, with evidence of severe bilateral foraminal stenosis at bilateral C3-4 C4-5, C5-6, no evidence of canal stenosis  Per patient, she was evaluated by neurosurgeon, will try  conservative treatment at this point, will try to get the record,   REVIEW OF SYSTEMS: Full 14 system review of systems performed and notable only for as above  ALLERGIES: Allergies  Allergen Reactions  . Oxycontin [Oxycodone Hcl] Swelling  . Sulfa Antibiotics Other (See Comments)    Sores  . Tramadol     "Makes me feel like I am falling"  . Penicillins Rash    HOME MEDICATIONS: Current Outpatient Prescriptions  Medication Sig Dispense Refill  . diclofenac (VOLTAREN) 75 MG EC tablet     . gabapentin (NEURONTIN) 100 MG capsule Take 2 capsules (200 mg total) by mouth 3 (three) times daily. 540 capsule 4  . hydroxychloroquine (PLAQUENIL) 200 MG tablet Taking 205m tab in am and 20047mn pm M-F only; none on Saturday or Sunday. 135 tablet 1  . NONFORMULARY OR COMPOUNDED ITEM Apply 1-2 application topically every 8 (eight) hours as needed (Diclofenac 3%, Lidocaine 5%, Menthol 1%     (Cream 100 Gm)).    . NUCYNTA 50 MG TABS tablet Take 50 mg by mouth 3 (three) times daily as needed for moderate pain.     . pantoprazole (PROTONIX) 40 MG tablet Take 40 mg by mouth daily.     . predniSONE (DELTASONE) 1 MG tablet Take 2 tablets (2 mg total) by mouth daily with breakfast. 180 tablet  0  . valsartan-hydrochlorothiazide (DIOVAN-HCT) 160-25 MG per tablet Take 1 tablet by mouth daily.    [name] amitriptyline (ELAVIL) 25 MG tablet      No current facility-administered medications for this visit.     PAST MEDICAL HISTORY: Past Medical History:  Diagnosis Date  . AC (acromioclavicular) joint bone spurs    lt shoulder  . Anemia   . Arthritis   . Carpal tunnel syndrome, bilateral   . Gastroesophageal reflux   . Headache    recent visit to ER @ [facility] for severe headache  . Hypertension   . Lumbar stenosis    Hx of ESIs by Dr. Nelva Bush  . Polyarthralgia   . Polymyalgia ([place])   . Shingles   . Spinal stenosis     PAST SURGICAL HISTORY: Past Surgical History:  Procedure Laterality Date  .  CHOLECYSTECTOMY    . COLONOSCOPY  06/11/2012   Procedure: COLONOSCOPY;  Surgeon: Jamesetta So, MD;  Location: [facility];  Service: Gastroenterology;  Laterality: N/A;  . HYSTEROSCOPY W/D&C N/A 02/25/2014   Procedure: DILATATION AND CURETTAGE /HYSTEROSCOPY;  Surgeon: Florian Buff, MD;  Location: [facility];  Service: Gynecology;  Laterality: N/A;  . POLYPECTOMY N/A 02/25/2014   Procedure: POLYPECTOMY;  Surgeon: Florian Buff, MD;  Location: [facility];  Service: Gynecology;  Laterality: N/A;  . RESECTION DISTAL CLAVICAL Right 03/26/2015   Procedure: OPEN DISTAL CLAVICAL RESECTION ;  Surgeon: Netta Cedars, MD;  Location: [facility];  Service: Orthopedics;  Laterality: Right;    FAMILY HISTORY: Family History  Problem Relation Age of Onset  . Hypertension Mother   . Pneumonia Father     SOCIAL HISTORY:  Social History   Social History  . Marital status: Widowed    Spouse name: N/A  . Number of children: 2  . Years of education: 1 yr coll   Occupational History  . Retired    Social History Main Topics  . Smoking status: Never Smoker  . Smokeless tobacco: Never Used  . Alcohol use No  . Drug use: No  . Sexual activity: Yes    Birth control/ protection: Post-menopausal   Other Topics Concern  . Not on file   Social History Narrative   Lives at home alone.   Right-handed.   1-2 cups caffeine daily.     PHYSICAL EXAM   Vitals:   04/04/17 1113  BP: 109/70  Pulse: 83  Weight: 250 lb 8 oz (113.6 kg)  Height: 5' (1.524 m)    Not recorded      Body mass index is 48.92 kg/m.  PHYSICAL EXAMNIATION:  Gen: NAD, conversant, well nourised, obese, well groomed                     Cardiovascular: Regular rate rhythm, no peripheral edema, warm, nontender. Eyes: Conjunctivae clear without exudates or hemorrhage Neck: Supple, no carotid bruise. Pulmonary: Clear to auscultation bilaterally   NEUROLOGICAL EXAM:  MENTAL STATUS: Speech:    Speech is normal; fluent and spontaneous  with normal comprehension.  Cognition:     Orientation to time, place and person     Normal recent and remote memory     Normal Attention span and concentration     Normal Language, naming, repeating,spontaneous speech     Fund of knowledge   CRANIAL NERVES: CN II: Visual fields are full to confrontation. Fundoscopic exam is normal with sharp discs and no vascular changes. Pupils are round equal and  briskly reactive to light. CN III, IV, VI: extraocular movement are normal. No ptosis. CN V: Facial sensation is intact to pinprick in all 3 divisions bilaterally. Corneal responses are intact.  CN VII: Face is symmetric with normal eye closure and smile. CN VIII: Hearing is normal to rubbing fingers CN IX, X: Palate elevates symmetrically. Phonation is normal. CN XI: Head turning and shoulder shrug are intact CN XII: Tongue is midline with normal movements and no atrophy.  MOTOR: She has mild bilateral shoulder abduction, external rotation, grip weak, she has mild bilateral hip flexion, knee flexion extension weakness, ankle dorsiflexion weakness, left worse than right, left side 2/5, right side 4/5  REFLEXES: Reflexes are 1 and symmetric at the biceps, triceps, knees, and absent at ankles. Plantar responses are flexor.  SENSORY: Length dependent decreased to light touch and vibratory sensation to distal shin level   Coordination: There was no no dysmetria on finger-to-nose and heel-knee-shin.    GAIT/STANCE: Need assistant to get up from seated position, antalgic, with left foot drop, unsteady, rely on her walker   DIAGNOSTIC DATA (LABS, IMAGING, TESTING) - I reviewed patient records, labs, notes, testing and imaging myself where available.  ASSESSMENT AND PLAN  Lore Polka is a 71 y.o. female    Severe lumbar stenosis  Evidence of active bilateral lumbosacral radiculopathy, active denervation at bilateral lumbosacral paraspinal muscles by EMG nerve conduction study in July  2017.  She has profound bilateral lower extremity weakness, left worse than right, due to long history of severe lumbar stenosis,  Per patient she was evaluated by neurosurgeon already, will try to get the record  Refilled gabapentin 100 mg 2 tablets 3 times a day, she could not tolerate higher dose, cause excessive drowsiness,  Neck pain, bilateral upper extremity paresthesia and weakness . Evidence of cervical spondylitic disease, moderate to severe C6-7 foraminal stenosis.  Polymyalgia rheumatica  With significantly elevated ESR 66, is on tapering dose of prednisone,  Will repeat laboratory evaluations,      Marcial Pacas, M.D. Ph.D.  [facility] [address], [place], [place] [address] Ph: [phone] Fax: [phone]  VO:HYWV Hilma Favors, MD  Patient aware of results and will keep pending appt.

## 2017-04-05 ENCOUNTER — Telehealth: Payer: Self-pay | Admitting: Neurology

## 2017-04-05 DIAGNOSIS — R269 Unspecified abnormalities of gait and mobility: Secondary | ICD-10-CM

## 2017-04-05 NOTE — Telephone Encounter (Signed)
Pt request lab results. She had labs drawn yesterday and was advised it can take 4-5 business days for results. Pt also wants to know if she is to take PT again. Please call

## 2017-04-06 LAB — VITAMIN B12: Vitamin B-12: 673 pg/mL (ref 232–1245)

## 2017-04-06 LAB — CK: Total CK: 157 U/L (ref 24–173)

## 2017-04-06 LAB — SEDIMENTATION RATE: Sed Rate: 18 mm/hr (ref 0–40)

## 2017-04-06 LAB — HEMOGLOBIN A1C
Est. average glucose Bld gHb Est-mCnc: 117 mg/dL
Hgb A1c MFr Bld: 5.7 % — ABNORMAL HIGH (ref 4.8–5.6)

## 2017-04-06 LAB — ANA W/REFLEX IF POSITIVE: Anti Nuclear Antibody(ANA): NEGATIVE

## 2017-04-06 LAB — C-REACTIVE PROTEIN: CRP: 8.4 mg/L — ABNORMAL HIGH (ref 0.0–4.9)

## 2017-04-06 LAB — TSH: TSH: 3 u[IU]/mL (ref 0.450–4.500)

## 2017-04-06 NOTE — Telephone Encounter (Signed)
Please call patient, laboratory evaluation showed normal ESR, mild elevated C reactive protein 8.4, which has improved compared to previous study, I have put the order for physical therapy,  She is to continue work with her primary care for management of polymyalgia rheumatica, gradual tapering down of prednisone.

## 2017-04-06 NOTE — Addendum Note (Signed)
Addended by: Marcial Pacas on: 04/06/2017 08:26 PM   Modules accepted: Orders

## 2017-04-09 NOTE — Telephone Encounter (Signed)
Attempted to contact patient at both listed phone numbers.  Unable to reach or leave a message.  Her primary number had a fast busy signal so it may be out-of-order.  Called her brother on [employer] Margaretha [place]).  He is aware of results and will provide them to patient (he wrote them down).  He will also make sure that she follows up on PT.  Provided him our number to call back with any questions.  He will check to see what is wrong with her phone.

## 2017-04-09 NOTE — Telephone Encounter (Signed)
Patient returned our call - results and referral discussed with patient.

## 2017-04-10 NOTE — Telephone Encounter (Signed)
Pt calling back asking for a call for a better understanding of how to go about the suggested physical therapy, please call.

## 2017-04-10 NOTE — Telephone Encounter (Signed)
Spoke to patient - she is aware that the order for PT has been placed and they will contact her to schedule an appointment.

## 2017-04-12 ENCOUNTER — Telehealth (HOSPITAL_COMMUNITY): Payer: Self-pay | Admitting: Physical Therapy

## 2017-04-12 ENCOUNTER — Encounter (HOSPITAL_COMMUNITY): Payer: Self-pay | Admitting: Physical Therapy

## 2017-04-12 ENCOUNTER — Ambulatory Visit (HOSPITAL_COMMUNITY): Payer: Medicare Other | Attending: Neurology | Admitting: Physical Therapy

## 2017-04-12 DIAGNOSIS — M25551 Pain in right hip: Secondary | ICD-10-CM | POA: Diagnosis not present

## 2017-04-12 DIAGNOSIS — G8929 Other chronic pain: Secondary | ICD-10-CM | POA: Insufficient documentation

## 2017-04-12 DIAGNOSIS — R262 Difficulty in walking, not elsewhere classified: Secondary | ICD-10-CM | POA: Diagnosis not present

## 2017-04-12 DIAGNOSIS — M5442 Lumbago with sciatica, left side: Secondary | ICD-10-CM | POA: Diagnosis not present

## 2017-04-12 DIAGNOSIS — R29898 Other symptoms and signs involving the musculoskeletal system: Secondary | ICD-10-CM | POA: Diagnosis not present

## 2017-04-12 DIAGNOSIS — M6281 Muscle weakness (generalized): Secondary | ICD-10-CM | POA: Diagnosis not present

## 2017-04-12 NOTE — Telephone Encounter (Signed)
Placed order request for custom AFO in outgoing fax box.   Deniece Ree PT, DPT [phone]

## 2017-04-12 NOTE — Therapy (Signed)
[place], [place], [address] Phone: [phone]   Fax:  [phone]  Physical Therapy Evaluation  Patient Details  Name: Nicole [name] MRN: [id number] Date of Birth: August 29, 1946 Referring Provider: Marcial Pacas   Encounter Date: 04/12/2017      PT End of Session - 04/12/17 1740    Visit Number 1   Number of Visits 9   Date for PT Re-Evaluation 05/10/17   Authorization Type Medicare and TRICARE    Authorization Time Period 04/12/17 TO 05/12/17   Authorization - Visit Number 1   Authorization - Number of Visits 10   PT Start Time 6629   PT Stop Time 1727   PT Time Calculation (min) 40 min   Activity Tolerance Patient tolerated treatment well;Patient limited by fatigue;Patient limited by pain   Behavior During Therapy [facility] for tasks assessed/performed      Past Medical History:  Diagnosis Date  . AC (acromioclavicular) joint bone spurs    lt shoulder  . Anemia   . Arthritis   . Carpal tunnel syndrome, bilateral   . Gastroesophageal reflux   . Headache    recent visit to ER @ [facility] for severe headache  . Hypertension   . Lumbar stenosis    Hx of ESIs by Dr. Nelva Bush  . Polyarthralgia   . Polymyalgia ([place])   . Shingles   . Spinal stenosis     Past Surgical History:  Procedure Laterality Date  . CHOLECYSTECTOMY    . COLONOSCOPY  06/11/2012   Procedure: COLONOSCOPY;  Surgeon: Jamesetta So, MD;  [facility];  Service: Gastroenterology;  Laterality: N/A;  . HYSTEROSCOPY W/D&C N/A 02/25/2014   Procedure: DILATATION AND CURETTAGE /HYSTEROSCOPY;  Surgeon: Florian Buff, MD;  Location: [facility];  Service: Gynecology;  Laterality: N/A;  . POLYPECTOMY N/A 02/25/2014   Procedure: POLYPECTOMY;  Surgeon: Florian Buff, MD;  Location: [facility];  Service: Gynecology;  Laterality: N/A;  . RESECTION DISTAL CLAVICAL Right 03/26/2015   Procedure: OPEN DISTAL CLAVICAL RESECTION ;  Surgeon: Netta Cedars, MD;  Location:  [facility];  Service: Orthopedics;  Laterality: Right;    There were no vitals filed for this visit.       Subjective Assessment - 04/12/17 1649    Subjective Patietn arrives stating that her problems started when we had a long period of rain about a month ago; her doctor has sent her to PT for her gait training. She was trying to do her exercises but states that she did not keep up with her HEP since she was discharged from PT earlier this year. She is on prednisone for rheumatoid arthritis and is tapering down per her MD's instructions. No falls recently, no close calls. She reports that she had tried to get a brace for her foot that drops when she walks but her insurance did not approve it.    Pertinent History HTN, DDD, lumbar/thoracic/cervical stenosis, polymyalgia rheumatica    How long can you sit comfortably? depends on how she is sitting/what she is sitting on   How long can you stand comfortably? immediate discomfort    How long can you walk comfortably? immediate discomfort    Patient Stated Goals walk better and get stronger    Currently in Pain? Yes   Pain Score 6    Pain Location Other (Comment)  down L LE to knee   Pain Orientation Left   Pain Descriptors / Indicators Aching;Sore  Pain Type Chronic pain   Pain Radiating Towards down L LE    Pain Onset More than a month ago   Pain Frequency Constant   Aggravating Factors  moving, starting to move    Pain Relieving Factors ointment and medicine someitmes    Effect of Pain on Daily Activities severe impact             [facility] PT Assessment - 04/12/17 0001      Assessment   Medical Diagnosis gait difficulty    Referring Provider Marcial Pacas    Onset Date/Surgical Date --  about 1 month ago    Next MD Visit Dr. Krista Blue September 2018   Prior Therapy PT late 2017/early 2018 25 visits total      Precautions   Precautions Fall     Balance Screen   Has the patient fallen in the past 6 months No   Has the patient had a  decrease in activity level because of a fear of falling?  No   Is the patient reluctant to leave their home because of a fear of falling?  No     Prior Function   Level of Independence Independent;Independent with basic ADLs;Needs assistance with ADLs;Requires assistive device for independence   Vocation Retired     Strength   Right Hip Flexion 3/5   Right Hip ABduction 2/5   Left Hip Flexion 3-/5   Left Hip ABduction 2/5   Right Knee Extension 4-/5   Left Knee Extension 3+/5   Right Ankle Dorsiflexion 3/5   Left Ankle Dorsiflexion 2/5     Transfers   Five time sit to stand comments  41.7 no UEs but difficulty coming fully erect      6 minute walk test results    Aerobic Endurance Distance Walked 226   Endurance additional comments 3MWT, 0.64m/s       Reduced gait speed, L foot drop with gait, flexed at hips, antalgic pattern       Objective measurements completed on examination: See above findings.                  PT Education - 04/12/17 1738    Education provided Yes   Education Details likely insurance limitations as she has had a considerable amount of therapy recently; POC and primary goals of PT, likely DC after 4 weeks of PT due to insurance limitations.    Person(s) Educated Patient   Methods Explanation   Comprehension Verbalized understanding          PT Short Term Goals - 04/12/17 1747      PT SHORT TERM GOAL #1   Title After 3 weeks patient will demonstrate improved strength and mobillty  AEB 5xSTS without use of UEs in less than 30 seconds    Time 2   Period Weeks   Status New     PT SHORT TERM GOAL #2   Title After 3 weeks pt will demonstrate improved tolerance to ambulation  able to sustain 3MWT at speed >0.48m/s.    Time 2   Period Weeks   Status New     PT SHORT TERM GOAL #3   Title Patient to be consistent in correctly performing HEP, to be modified as needed    Time 1   Period Weeks   Status New           PT Long  Term Goals - 04/12/17 1749      PT LONG TERM  GOAL #1   Title Patient to demonsrate improvement of at least 1 MMT grade in all tested groups in order to improve gait and mobility    Time 4   Period Weeks   Status New     PT LONG TERM GOAL #2   Title After 4 weeks pt will demonstrate improved tolerance to ambulation able to sustain 3MWT at speed >0.20m/s.   Time 4   Period Weeks   Status New     PT LONG TERM GOAL #3   Title After 6 weeks pt will demonstrate independence in an advanced HEP and independent exercise program at appopriate gym facility established by PT.    Time 4   Period Weeks   Status New                Plan - 04/12/17 1742    Clinical Impression Statement Patient arrives reporting that she has had a sharp functional decline starting about a  month ago; she states that she has not always been the best keeping up with HEP and feels overwhelmed with it, although she has tried her best to keep up as she can. Examination reveals significant gait impairment, reduced functional activity tolerance, functional muscle weakness, decreased ease of functional mobility, and considerable generalized deconditioning. Patient has already had a substantial amount of session with skilled PT services this year (25 visits- 13 of these have been in 2018, the rest were in 2017); discussed likely insurance limitations and that we will likely only be able to see her for a limited number of sessions due to this. Recommend short course of skilled PT services to address deconditioning, improve safety and mobility, and assist in improving compliance to HEP before discharge; currently recommend no more than 4 weeks of therapy.    History and Personal Factors relevant to plan of care: recently received substantial amount of skilled PT services; complex PMH including spinal stenosis impacting function   Clinical Presentation Stable   Clinical Presentation due to: chronic deconditioning and substantial  PMH including spinal stenosis    Clinical Decision Making Low   Rehab Potential Fair   Clinical Impairments Affecting Rehab Potential (+) motivated to particpate, moderate outcomes with skilled PT services; (-) complex PMH, severity and chronicity of impairments, general non-compliance with HPE    PT Frequency 2x / week   PT Duration 4 weeks   PT Treatment/Interventions ADLs/Self Care Home Management;Cryotherapy;Moist Heat;DME Instruction;Gait training;Stair training;Functional mobility training;Therapeutic activities;Balance training;Neuromuscular re-education;Therapeutic exercise;Patient/family education;Orthotic Fit/Training;Manual techniques;Energy conservation   PT Next Visit Plan review initial eval/goals; focus on functional strength and functional activity tolerance. F/U on AFO. Likely DC after 4 weeks of skilled PT.    PT Home Exercise Plan has current HEP from prior therapy    Consulted and Agree with Plan of Care Patient      Patient will benefit from skilled therapeutic intervention in order to improve the following deficits and impairments:  Abnormal gait, Impaired sensation, Improper body mechanics, Pain, Decreased coordination, Postural dysfunction, Decreased activity tolerance, Decreased strength, Difficulty walking  Visit Diagnosis: Difficulty in walking, not elsewhere classified - Plan: PT plan of care cert/re-cert  Muscle weakness (generalized) - Plan: PT plan of care cert/re-cert  Other symptoms and signs involving the musculoskeletal system - Plan: PT plan of care cert/re-cert      G-Codes - 35/00/93 1751    Functional Assessment Tool Used (Outpatient Only) Based on skilled clinical assessment of strength, gait pattern, functional activity tolerance, mobilty  Functional Limitation Mobility: Walking and moving around   Mobility: Walking and Moving Around Current Status [phone]) At least 60 percent but less than 80 percent impaired, limited or restricted   Mobility:  Walking and Moving Around Goal Status [phone]) At least 40 percent but less than 60 percent impaired, limited or restricted       Problem List Patient Active Problem List   Diagnosis Date Noted  . Memory loss 04/04/2017  . Polymyalgia rheumatica ([place]) 04/04/2017  . High risk medication use 12/07/2016  . PMR (polymyalgia rheumatica) (HCC) 09/27/2016  . DDD (degenerative disc disease), lumbar 09/27/2016  . HTN (hypertension) 06/05/2016  . Chronic pain syndrome 06/05/2016  . Acute diverticulitis 06/02/2016  . Diverticulitis large intestine 06/02/2016  . Spinal stenosis of lumbar region 02/29/2016  . Joint pain 02/29/2016  . Endometrial polyp 02/06/2014  . Postmenopausal bleeding 01/30/2014  . GERD (gastroesophageal reflux disease) 09/29/2013  . Spinal stenosis, thoracic 09/29/2013  . Shingles 09/29/2013  . Thoracic or lumbosacral neuritis or radiculitis, unspecified 05/04/2011  . Abnormality of gait 05/04/2011  . Muscle weakness (generalized) 05/04/2011  . KNEE, ARTHRITIS, DEGEN./OSTEO 04/07/2009  . KNEE PAIN 04/07/2009    Deniece Ree PT, DPT [place] [address] [place], [place], [address] Phone: [phone]   Fax:  [phone]  Name: Chailyn Racette MRN: [id number] Date of Birth: 1946/06/25

## 2017-04-17 ENCOUNTER — Encounter (HOSPITAL_COMMUNITY): Payer: Self-pay

## 2017-04-17 ENCOUNTER — Ambulatory Visit (HOSPITAL_COMMUNITY): Payer: Medicare Other

## 2017-04-17 DIAGNOSIS — R262 Difficulty in walking, not elsewhere classified: Secondary | ICD-10-CM

## 2017-04-17 DIAGNOSIS — R29898 Other symptoms and signs involving the musculoskeletal system: Secondary | ICD-10-CM

## 2017-04-17 DIAGNOSIS — M25551 Pain in right hip: Secondary | ICD-10-CM | POA: Diagnosis not present

## 2017-04-17 DIAGNOSIS — M6281 Muscle weakness (generalized): Secondary | ICD-10-CM

## 2017-04-17 DIAGNOSIS — M5442 Lumbago with sciatica, left side: Secondary | ICD-10-CM | POA: Diagnosis not present

## 2017-04-17 DIAGNOSIS — G8929 Other chronic pain: Secondary | ICD-10-CM

## 2017-04-17 NOTE — Therapy (Signed)
[place], [place], [address] Phone: [phone]   Fax:  [phone]  Physical Therapy Treatment  Patient Details  Name: Nicole [name] MRN: [id number] Date of Birth: 19-Aug-1946 Referring Provider: Marcial Pacas   Encounter Date: 04/17/2017      PT End of Session - 04/17/17 1305    Visit Number 2   Number of Visits 9   Date for PT Re-Evaluation 05/10/17   Authorization Type Medicare and TRICARE    Authorization Time Period 04/12/17 TO 05/12/17   Authorization - Visit Number 2   Authorization - Number of Visits 10   PT Start Time 1300   PT Stop Time 1342   PT Time Calculation (min) 42 min   Activity Tolerance Patient tolerated treatment well;Patient limited by fatigue;Patient limited by pain   Behavior During Therapy [facility] for tasks assessed/performed      Past Medical History:  Diagnosis Date  . AC (acromioclavicular) joint bone spurs    lt shoulder  . Anemia   . Arthritis   . Carpal tunnel syndrome, bilateral   . Gastroesophageal reflux   . Headache    recent visit to ER @ [facility] for severe headache  . Hypertension   . Lumbar stenosis    Hx of ESIs by Dr. Nelva Bush  . Polyarthralgia   . Polymyalgia ([place])   . Shingles   . Spinal stenosis     Past Surgical History:  Procedure Laterality Date  . CHOLECYSTECTOMY    . COLONOSCOPY  06/11/2012   Procedure: COLONOSCOPY;  Surgeon: Jamesetta So, MD;  [facility];  Service: Gastroenterology;  Laterality: N/A;  . HYSTEROSCOPY W/D&C N/A 02/25/2014   Procedure: DILATATION AND CURETTAGE /HYSTEROSCOPY;  Surgeon: Florian Buff, MD;  Location: [facility];  Service: Gynecology;  Laterality: N/A;  . POLYPECTOMY N/A 02/25/2014   Procedure: POLYPECTOMY;  Surgeon: Florian Buff, MD;  Location: [facility];  Service: Gynecology;  Laterality: N/A;  . RESECTION DISTAL CLAVICAL Right 03/26/2015   Procedure: OPEN DISTAL CLAVICAL RESECTION ;  Surgeon: Netta Cedars, MD;  Location: [place];  Service: Orthopedics;  Laterality: Right;    There were no vitals filed for this visit.      Subjective Assessment - 04/17/17 1303    Subjective Pt states that she is doing well today. She has some pain but it's nothing out of the ordinary. She was a little sore following last session. She forgot to bring her list of HEP.    Pertinent History HTN, DDD, lumbar/thoracic/cervical stenosis, polymyalgia rheumatica    How long can you sit comfortably? depends on how she is sitting/what she is sitting on   How long can you stand comfortably? immediate discomfort    How long can you walk comfortably? immediate discomfort    Patient Stated Goals walk better and get stronger    Currently in Pain? Yes   Pain Score 5    Pain Location Back  down the center towards LLE   Pain Orientation Lower   Pain Descriptors / Indicators Aching   Pain Onset More than a month ago   Pain Frequency Constant   Aggravating Factors  moving, starting to move   Pain Relieving Factors ointment and medicine sometimes   Effect of Pain on Daily Activities severe impact             [facility] Adult PT Treatment/Exercise - 04/17/17 0001      Exercises   Exercises  Lumbar     Lumbar Exercises: Stretches   Single Knee to Chest Stretch 2 reps;30 seconds   Single Knee to Chest Stretch Limitations supine with sheet   Lower Trunk Rotation Limitations x10 reps each     Lumbar Exercises: Standing   Other Standing Lumbar Exercises toe taps on 2" step x 3 reps each with BUE support (significant difficulty with LLE)     Lumbar Exercises: Seated   Sit to Stand 5 reps   Sit to Stand Limitations from mat table, needed momentum to perform all 5 reps     Lumbar Exercises: Supine   Clam 15 reps   Clam Limitations with ab set and RTB   Bridge 15 reps             PT Education - 04/17/17 1346    Education provided Yes   Education Details reviewed goal and issued copy of eval; exercise technique, how to perform sit  <> stands safely with rolaltor   Person(s) Educated Patient   Methods Explanation;Demonstration   Comprehension Verbalized understanding;Returned demonstration;Need further instruction          PT Short Term Goals - 04/12/17 1747      PT SHORT TERM GOAL #1   Title After 3 weeks patient will demonstrate improved strength and mobillty  AEB 5xSTS without use of UEs in less than 30 seconds    Time 2   Period Weeks   Status New     PT SHORT TERM GOAL #2   Title After 3 weeks pt will demonstrate improved tolerance to ambulation  able to sustain 3MWT at speed >0.53m/s.    Time 2   Period Weeks   Status New     PT SHORT TERM GOAL #3   Title Patient to be consistent in correctly performing HEP, to be modified as needed    Time 1   Period Weeks   Status New           PT Long Term Goals - 04/12/17 1749      PT LONG TERM GOAL #1   Title Patient to demonsrate improvement of at least 1 MMT grade in all tested groups in order to improve gait and mobility    Time 4   Period Weeks   Status New     PT LONG TERM GOAL #2   Title After 4 weeks pt will demonstrate improved tolerance to ambulation able to sustain 3MWT at speed >0.81m/s.   Time 4   Period Weeks   Status New     PT LONG TERM GOAL #3   Title After 6 weeks pt will demonstrate independence in an advanced HEP and independent exercise program at appopriate gym facility established by PT.    Time 4   Period Weeks   Status New               Plan - 04/17/17 1347    Clinical Impression Statement Reviewed goals and issued copy of eval to pt who had no f/u questions. Pt reported increased stiffness this date so began session with LTRs and SKTC. Performed BLE therex and functional strengthening this date with pt. She appeared to have some increased pain but when asked about it she kept saying "it's just sore and I cramp up a lot." Pt had significant difficulty with sit <> stands from the mat and with toe taps on 2" step,  especially with LLE. Pt required frequent rest breaks throughout session due to fatigue.  PT provided extensive education on how to safely stand up and sit down from a chair with her rollator in front, but this needs to be reiterated in future sessions. Provided pt with the signed referral for an AFO for her L foot and also provided pt with a handout of the local [employer] companies. Pt verbalized understanding. Continue POC as planned per pt's tolerance.   Rehab Potential Fair   Clinical Impairments Affecting Rehab Potential (+) motivated to particpate, moderate outcomes with skilled PT services; (-) complex PMH, severity and chronicity of impairments, general non-compliance with HPE    PT Frequency 2x / week   PT Duration 4 weeks   PT Treatment/Interventions ADLs/Self Care Home Management;Cryotherapy;Moist Heat;DME Instruction;Gait training;Stair training;Functional mobility training;Therapeutic activities;Balance training;Neuromuscular re-education;Therapeutic exercise;Patient/family education;Orthotic Fit/Training;Manual techniques;Energy conservation   PT Next Visit Plan continue to focus on functional strength and functional activity tolerance to pt's tolerance. continue to F/U on AFO and if pt called one of the P[employer] [place] has current HEP from prior therapy    Consulted and Agree with Plan of Care Patient      Patient will benefit from skilled therapeutic intervention in order to improve the following deficits and impairments:  Abnormal gait, Impaired sensation, Improper body mechanics, Pain, Decreased coordination, Postural dysfunction, Decreased activity tolerance, Decreased strength, Difficulty walking  Visit Diagnosis: Difficulty in walking, not elsewhere classified  Muscle weakness (generalized)  Other symptoms and signs involving the musculoskeletal system  Pain in right hip  Chronic midline low back pain with left-sided sciatica     Problem List Patient  Active Problem List   Diagnosis Date Noted  . Memory loss 04/04/2017  . Polymyalgia rheumatica ([place]) 04/04/2017  . High risk medication use 12/07/2016  . PMR (polymyalgia rheumatica) (HCC) 09/27/2016  . DDD (degenerative disc disease), lumbar 09/27/2016  . HTN (hypertension) 06/05/2016  . Chronic pain syndrome 06/05/2016  . Acute diverticulitis 06/02/2016  . Diverticulitis large intestine 06/02/2016  . Spinal stenosis of lumbar region 02/29/2016  . Joint pain 02/29/2016  . Endometrial polyp 02/06/2014  . Postmenopausal bleeding 01/30/2014  . GERD (gastroesophageal reflux disease) 09/29/2013  . Spinal stenosis, thoracic 09/29/2013  . Shingles 09/29/2013  . Thoracic or lumbosacral neuritis or radiculitis, unspecified 05/04/2011  . Abnormality of gait 05/04/2011  . Muscle weakness (generalized) 05/04/2011  . KNEE, ARTHRITIS, DEGEN./OSTEO 04/07/2009  . KNEE PAIN 04/07/2009     Geraldine Solar PT, DPT   [place] [address] [place], [place], [address] Phone: [phone]   Fax:  [phone]  Name: Yareni Creps MRN: [id number] Date of Birth: [birth year]

## 2017-04-20 ENCOUNTER — Ambulatory Visit (HOSPITAL_COMMUNITY): Payer: Medicare Other | Admitting: Physical Therapy

## 2017-04-20 DIAGNOSIS — G8929 Other chronic pain: Secondary | ICD-10-CM | POA: Diagnosis not present

## 2017-04-20 DIAGNOSIS — M5442 Lumbago with sciatica, left side: Secondary | ICD-10-CM | POA: Diagnosis not present

## 2017-04-20 DIAGNOSIS — R262 Difficulty in walking, not elsewhere classified: Secondary | ICD-10-CM

## 2017-04-20 DIAGNOSIS — M25551 Pain in right hip: Secondary | ICD-10-CM

## 2017-04-20 DIAGNOSIS — R29898 Other symptoms and signs involving the musculoskeletal system: Secondary | ICD-10-CM

## 2017-04-20 DIAGNOSIS — M6281 Muscle weakness (generalized): Secondary | ICD-10-CM

## 2017-04-20 NOTE — Therapy (Signed)
[place], [place], [address] Phone: [phone]   Fax:  [phone]  Physical Therapy Treatment  Patient Details  Name: Nicole [name] MRN: [id number] Date of Birth: [birth year] Referring Provider: Marcial Pacas   Encounter Date: 04/20/2017      PT End of Session - 04/20/17 1547    Visit Number 3   Number of Visits 9   Date for PT Re-Evaluation 05/10/17   Authorization Type Medicare and TRICARE    Authorization Time Period 04/12/17 TO 05/12/17   Authorization - Visit Number 3   Authorization - Number of Visits 10   PT Start Time 1525   PT Stop Time 1600   PT Time Calculation (min) 35 min   Activity Tolerance Patient tolerated treatment well;Patient limited by fatigue;Patient limited by pain   Behavior During Therapy [facility] for tasks assessed/performed      Past Medical History:  Diagnosis Date  . AC (acromioclavicular) joint bone spurs    lt shoulder  . Anemia   . Arthritis   . Carpal tunnel syndrome, bilateral   . Gastroesophageal reflux   . Headache    recent visit to ER @ [facility] for severe headache  . Hypertension   . Lumbar stenosis    Hx of ESIs by Dr. Nelva Bush  . Polyarthralgia   . Polymyalgia ([place])   . Shingles   . Spinal stenosis     Past Surgical History:  Procedure Laterality Date  . CHOLECYSTECTOMY    . COLONOSCOPY  06/11/2012   Procedure: COLONOSCOPY;  Surgeon: Jamesetta So, MD;  [facility];  Service: Gastroenterology;  Laterality: N/A;  . HYSTEROSCOPY W/D&C N/A 02/25/2014   Procedure: DILATATION AND CURETTAGE /HYSTEROSCOPY;  Surgeon: Florian Buff, MD;  Location: [facility];  Service: Gynecology;  Laterality: N/A;  . POLYPECTOMY N/A 02/25/2014   Procedure: POLYPECTOMY;  Surgeon: Florian Buff, MD;  Location: [facility];  Service: Gynecology;  Laterality: N/A;  . RESECTION DISTAL CLAVICAL Right 03/26/2015   Procedure: OPEN DISTAL CLAVICAL RESECTION ;  Surgeon: Netta Cedars, MD;  Location: [place];  Service: Orthopedics;  Laterality: Right;    There were no vitals filed for this visit.      Subjective Assessment - 04/20/17 1524    Subjective Pt states that she is doing well today. She has some pain but it's nothing out of the ordinary. She was a little sore following last session. She forgot to bring her list of HEP.    Pertinent History HTN, DDD, lumbar/thoracic/cervical stenosis, polymyalgia rheumatica    How long can you sit comfortably? depends on how she is sitting/what she is sitting on   How long can you stand comfortably? immediate discomfort    How long can you walk comfortably? immediate discomfort    Patient Stated Goals walk better and get stronger    Currently in Pain? Yes   Pain Score 7    Pain Location Back   Pain Orientation --  center    Pain Descriptors / Indicators Aching;Throbbing   Pain Onset More than a month ago   Aggravating Factors  activity                          [facility] Adult PT Treatment/Exercise - 04/20/17 0001      Bed Mobility   Bed Mobility Rolling Right;Rolling Left;Supine to Sit;Sit to Supine   Rolling Right 4: Min guard  Supine to Sit 4: Min assist   Sit to Supine 4: Min assist     Lumbar Exercises: Seated   Sit to Stand 5 reps     Lumbar Exercises: Supine   Bridge 15 reps   Other Supine Lumbar Exercises decompression 1-5    Other Supine Lumbar Exercises isometer ab/adduction x10      Lumbar Exercises: Sidelying   Hip Abduction 10 reps   Other Sidelying Lumbar Exercises isometric hip abduction x 10                   PT Short Term Goals - 04/12/17 1747      PT SHORT TERM GOAL #1   Title After 3 weeks patient will demonstrate improved strength and mobillty  AEB 5xSTS without use of UEs in less than 30 seconds    Time 2   Period Weeks   Status New     PT SHORT TERM GOAL #2   Title After 3 weeks pt will demonstrate improved tolerance to ambulation  able to sustain 3MWT at speed >0.30m/s.     Time 2   Period Weeks   Status New     PT SHORT TERM GOAL #3   Title Patient to be consistent in correctly performing HEP, to be modified as needed    Time 1   Period Weeks   Status New           PT Long Term Goals - 04/12/17 1749      PT LONG TERM GOAL #1   Title Patient to demonsrate improvement of at least 1 MMT grade in all tested groups in order to improve gait and mobility    Time 4   Period Weeks   Status New     PT LONG TERM GOAL #2   Title After 4 weeks pt will demonstrate improved tolerance to ambulation able to sustain 3MWT at speed >0.80m/s.   Time 4   Period Weeks   Status New     PT LONG TERM GOAL #3   Title After 6 weeks pt will demonstrate independence in an advanced HEP and independent exercise program at appopriate gym facility established by PT.    Time 4   Period Weeks   Status New               Plan - 04/20/17 1547    Clinical Impression Statement Pt unable to complete a Lt heel slide this session.  Pt hip abduction strength B 2-/5.  Pt is unable to complete functional strengthening due to weakness therefore worked on core strength on mat    Rehab Potential Fair   Clinical Impairments Affecting Rehab Potential (+) motivated to particpate, moderate outcomes with skilled PT services; (-) complex PMH, severity and chronicity of impairments, general non-compliance with HPE    PT Frequency 2x / week   PT Duration 4 weeks   PT Treatment/Interventions ADLs/Self Care Home Management;Cryotherapy;Moist Heat;DME Instruction;Gait training;Stair training;Functional mobility training;Therapeutic activities;Balance training;Neuromuscular re-education;Therapeutic exercise;Patient/family education;Orthotic Fit/Training;Manual techniques;Energy conservation   PT Next Visit Plan continue to focus on functional strength and functional activity tolerance to pt's tolerance. continue to F/U on AFO and if pt called one of the [facility] [place] has  current HEP from prior therapy    Consulted and Agree with Plan of Care Patient      Patient will benefit from skilled therapeutic intervention in order to improve the following deficits and impairments:  Abnormal  gait, Impaired sensation, Improper body mechanics, Pain, Decreased coordination, Postural dysfunction, Decreased activity tolerance, Decreased strength, Difficulty walking  Visit Diagnosis: Difficulty in walking, not elsewhere classified  Muscle weakness (generalized)  Other symptoms and signs involving the musculoskeletal system  Pain in right hip  Chronic midline low back pain with left-sided sciatica     Problem List Patient Active Problem List   Diagnosis Date Noted  . Memory loss 04/04/2017  . Polymyalgia rheumatica ([place]) 04/04/2017  . High risk medication use 12/07/2016  . PMR (polymyalgia rheumatica) (HCC) 09/27/2016  . DDD (degenerative disc disease), lumbar 09/27/2016  . HTN (hypertension) 06/05/2016  . Chronic pain syndrome 06/05/2016  . Acute diverticulitis 06/02/2016  . Diverticulitis large intestine 06/02/2016  . Spinal stenosis of lumbar region 02/29/2016  . Joint pain 02/29/2016  . Endometrial polyp 02/06/2014  . Postmenopausal bleeding 01/30/2014  . GERD (gastroesophageal reflux disease) 09/29/2013  . Spinal stenosis, thoracic 09/29/2013  . Shingles 09/29/2013  . Thoracic or lumbosacral neuritis or radiculitis, unspecified 05/04/2011  . Abnormality of gait 05/04/2011  . Muscle weakness (generalized) 05/04/2011  . KNEE, ARTHRITIS, DEGEN./OSTEO 04/07/2009  . KNEE PAIN 04/07/2009    Rayetta Humphrey, PT CLT [phone] 04/20/2017, 4:03 PM  [place] [address] [place], [place], [address] Phone: [phone]   Fax:  [phone]  Name: Nicole [name] MRN: [id number] Date of Birth: August 08, 1946

## 2017-04-23 ENCOUNTER — Ambulatory Visit (HOSPITAL_COMMUNITY): Payer: Medicare Other | Attending: Neurology | Admitting: Physical Therapy

## 2017-04-23 DIAGNOSIS — R29898 Other symptoms and signs involving the musculoskeletal system: Secondary | ICD-10-CM

## 2017-04-23 DIAGNOSIS — M25551 Pain in right hip: Secondary | ICD-10-CM | POA: Insufficient documentation

## 2017-04-23 DIAGNOSIS — M6281 Muscle weakness (generalized): Secondary | ICD-10-CM | POA: Diagnosis not present

## 2017-04-23 DIAGNOSIS — M5442 Lumbago with sciatica, left side: Secondary | ICD-10-CM | POA: Diagnosis not present

## 2017-04-23 DIAGNOSIS — R262 Difficulty in walking, not elsewhere classified: Secondary | ICD-10-CM | POA: Insufficient documentation

## 2017-04-23 DIAGNOSIS — G8929 Other chronic pain: Secondary | ICD-10-CM | POA: Insufficient documentation

## 2017-04-23 NOTE — Therapy (Signed)
[place], [place], [address] Phone: [phone]   Fax:  [phone]  Physical Therapy Treatment  Patient Details  Name: Nicole [name] MRN: [id number] Date of Birth: August 15, 1946 Referring Provider: Marcial Pacas   Encounter Date: 04/23/2017      PT End of Session - 04/23/17 1733    Visit Number 4   Number of Visits 9   Date for PT Re-Evaluation 05/10/17   Authorization Type Medicare and TRICARE    Authorization Time Period 04/12/17 TO 05/12/17   Authorization - Visit Number 4   Authorization - Number of Visits 10   PT Start Time 7416   PT Stop Time 1728   PT Time Calculation (min) 41 min   Activity Tolerance Patient tolerated treatment well;Patient limited by fatigue;Patient limited by pain   Behavior During Therapy [facility] for tasks assessed/performed      Past Medical History:  Diagnosis Date  . AC (acromioclavicular) joint bone spurs    lt shoulder  . Anemia   . Arthritis   . Carpal tunnel syndrome, bilateral   . Gastroesophageal reflux   . Headache    recent visit to ER @ [facility] for severe headache  . Hypertension   . Lumbar stenosis    Hx of ESIs by Dr. Nelva Bush  . Polyarthralgia   . Polymyalgia ([place])   . Shingles   . Spinal stenosis     Past Surgical History:  Procedure Laterality Date  . CHOLECYSTECTOMY    . COLONOSCOPY  06/11/2012   Procedure: COLONOSCOPY;  Surgeon: Jamesetta So, MD;  [facility];  Service: Gastroenterology;  Laterality: N/A;  . HYSTEROSCOPY W/D&C N/A 02/25/2014   Procedure: DILATATION AND CURETTAGE /HYSTEROSCOPY;  Surgeon: Florian Buff, MD;  [facility];  Service: Gynecology;  Laterality: N/A;  . POLYPECTOMY N/A 02/25/2014   Procedure: POLYPECTOMY;  Surgeon: Florian Buff, MD;  Location: [facility];  Service: Gynecology;  Laterality: N/A;  . RESECTION DISTAL CLAVICAL Right 03/26/2015   Procedure: OPEN DISTAL CLAVICAL RESECTION ;  Surgeon: Netta Cedars, MD;  Location: [place];  Service: Orthopedics;  Laterality: Right;    There were no vitals filed for this visit.      Subjective Assessment - 04/23/17 1650    Subjective Patient arrives she has started the process of getting AFO from [place] now that we have signed order, she plans to call again tomorrow. She was very sore over the weekend, especially in her joints. No falls since last session. She forgot to bring her HEP handout again.    Pertinent History HTN, DDD, lumbar/thoracic/cervical stenosis, polymyalgia rheumatica    Patient Stated Goals walk better and get stronger    Currently in Pain? Yes   Pain Score 5    Pain Location Hip   Pain Orientation Right                         [facility] Adult PT Treatment/Exercise - 04/23/17 0001      Lumbar Exercises: Standing   Other Standing Lumbar Exercises seated ankle DF B 1x15 (partial ROM L due to chronic weakness)     Lumbar Exercises: Seated   Long Arc Quad on Chair Both;1 set;10 reps   LAQ on Chair Weights (lbs) 0     Lumbar Exercises: Supine   Ab Set 15 reps;5 seconds   Glut Set 20 reps;5 seconds   Heel Slides 15 reps  Heel Slides Limitations B   min assist L LE    Bridge 20 reps   Other Supine Lumbar Exercises straight leg hip ABD 1x10 each    Other Supine Lumbar Exercises isometric hip ABD and ADD 1x10 each, 3 second holds   LEs bent                 PT Education - 04/23/17 1732    Education provided Yes   Education Details coming to the end of prescribed sessions and it is vital she bring maintenance HEP from last time in for [place] to update/modifty as needed; continue to plan hard DC after 8 tx sessions; do HEP to some extent every day     Person(s) Educated Patient   Methods Explanation   Comprehension Verbalized understanding;Need further instruction          PT Short Term Goals - 04/12/17 1747      PT SHORT TERM GOAL #1   Title After 3 weeks patient will demonstrate improved strength and mobillty  AEB  5xSTS without use of UEs in less than 30 seconds    Time 2   Period Weeks   Status New     PT SHORT TERM GOAL #2   Title After 3 weeks pt will demonstrate improved tolerance to ambulation  able to sustain 3MWT at speed >0.71m/s.    Time 2   Period Weeks   Status New     PT SHORT TERM GOAL #3   Title Patient to be consistent in correctly performing HEP, to be modified as needed    Time 1   Period Weeks   Status New           PT Long Term Goals - 04/12/17 1749      PT LONG TERM GOAL #1   Title Patient to demonsrate improvement of at least 1 MMT grade in all tested groups in order to improve gait and mobility    Time 4   Period Weeks   Status New     PT LONG TERM GOAL #2   Title After 4 weeks pt will demonstrate improved tolerance to ambulation able to sustain 3MWT at speed >0.43m/s.   Time 4   Period Weeks   Status New     PT LONG TERM GOAL #3   Title After 6 weeks pt will demonstrate independence in an advanced HEP and independent exercise program at appopriate gym facility established by PT.    Time 4   Period Weeks   Status New               Plan - 04/23/17 1734    Clinical Impression Statement Continued with functional exercises as tolerated by patient; she continues to be quite limited by pain and general deconditioning moving forward. She continues to forget to bring her HEP from prior PT to sessions, and was encouraged to do so next time she comes; she also reports mixed compliance with HEP thus far due to "being sore". At this time she is near the halfway point of prescribed skilled PT services- continue to recommend hard DC to independent exercise program at the end of her scheduled sessions due to generally poor prognosis with significant success with skilled PT services.    Rehab Potential Fair   Clinical Impairments Affecting Rehab Potential (+) motivated to particpate, moderate outcomes with skilled PT services; (-) complex PMH, severity and chronicity  of impairments, general non-compliance with HPE    PT  Frequency 2x / week   PT Duration 4 weeks   PT Treatment/Interventions ADLs/Self Care Home Management;Cryotherapy;Moist Heat;DME Instruction;Gait training;Stair training;Functional mobility training;Therapeutic activities;Balance training;Neuromuscular re-education;Therapeutic exercise;Patient/family education;Orthotic Fit/Training;Manual techniques;Energy conservation   PT Next Visit Plan conintue working on functional strength, introduce more [facility] exercise as able. F/U on HEP- patient needs to bring her final HEP from last bout of PT for modification/update. Continue to plan DC on 7/19.    PT Home Exercise Plan has current HEP from prior therapy    Consulted and Agree with Plan of Care Patient      Patient will benefit from skilled therapeutic intervention in order to improve the following deficits and impairments:  Abnormal gait, Impaired sensation, Improper body mechanics, Pain, Decreased coordination, Postural dysfunction, Decreased activity tolerance, Decreased strength, Difficulty walking  Visit Diagnosis: Difficulty in walking, not elsewhere classified  Muscle weakness (generalized)  Other symptoms and signs involving the musculoskeletal system     Problem List Patient Active Problem List   Diagnosis Date Noted  . Memory loss 04/04/2017  . Polymyalgia rheumatica ([place]) 04/04/2017  . High risk medication use 12/07/2016  . PMR (polymyalgia rheumatica) (HCC) 09/27/2016  . DDD (degenerative disc disease), lumbar 09/27/2016  . HTN (hypertension) 06/05/2016  . Chronic pain syndrome 06/05/2016  . Acute diverticulitis 06/02/2016  . Diverticulitis large intestine 06/02/2016  . Spinal stenosis of lumbar region 02/29/2016  . Joint pain 02/29/2016  . Endometrial polyp 02/06/2014  . Postmenopausal bleeding 01/30/2014  . GERD (gastroesophageal reflux disease) 09/29/2013  . Spinal stenosis, thoracic 09/29/2013  . Shingles 09/29/2013   . Thoracic or lumbosacral neuritis or radiculitis, unspecified 05/04/2011  . Abnormality of gait 05/04/2011  . Muscle weakness (generalized) 05/04/2011  . KNEE, ARTHRITIS, DEGEN./OSTEO 04/07/2009  . KNEE PAIN 04/07/2009    Deniece Ree PT, DPT [facility] [facility] [address] [place], [place], [address] Phone: [phone]   Fax:  [phone]  Name: Nicole [name] MRN: [id number] Date of Birth: [birth year]

## 2017-04-27 ENCOUNTER — Ambulatory Visit (HOSPITAL_COMMUNITY): Payer: Medicare Other | Admitting: Physical Therapy

## 2017-04-27 DIAGNOSIS — M25551 Pain in right hip: Secondary | ICD-10-CM | POA: Diagnosis not present

## 2017-04-27 DIAGNOSIS — G8929 Other chronic pain: Secondary | ICD-10-CM | POA: Diagnosis not present

## 2017-04-27 DIAGNOSIS — R262 Difficulty in walking, not elsewhere classified: Secondary | ICD-10-CM | POA: Diagnosis not present

## 2017-04-27 DIAGNOSIS — M6281 Muscle weakness (generalized): Secondary | ICD-10-CM

## 2017-04-27 DIAGNOSIS — M5442 Lumbago with sciatica, left side: Secondary | ICD-10-CM | POA: Diagnosis not present

## 2017-04-27 DIAGNOSIS — R29898 Other symptoms and signs involving the musculoskeletal system: Secondary | ICD-10-CM

## 2017-04-27 NOTE — Patient Instructions (Signed)
Isometric Abdominal    Lying on back with knees bent, tighten stomach by pressing elbows down. Hold _3___ seconds. Repeat _10___ times per set. Do ___1_ sets per session. Do _3___ sessions per day.  http://orth.exer.us/1086   Copyright  VHI. All rights reserved.  Isometric Gluteals    Tighten buttock muscles. Repeat 10____ times per set. Do __1__ sets per session. Do __3__ sessions per day.  http://orth.exer.us/1126   Copyright  VHI. All rights reserved.  Bridging    Slowly raise buttocks from floor, keeping stomach tight. Repeat _5-10___ times per set. Do ____ sets per session. Do 2____ sessions per day. 1 http://orth.exer.us/1096   Copyright  VHI. All rights reserved.  Heel Walk (Hook-Lying)    Tighten stomach and slowly walk feet forward in short steps until legs are nearly straight, or until back begins to arch. Repeat __5_-10_ times per set. Do 1____ sets per session. Do __2__ sessions per day.  http://orth.exer.us/1094   Copyright  VHI. All rights reserved.  Strengthening: Hip Adduction - Isometric    With ball or folded pillow between knees, squeeze knees together. Hold __5__ seconds. Repeat _10___ times per set. Do ___1_ sets per session. Do ___2_ sessions per day.  http://orth.exer.us/612   Copyright  VHI. All rights reserved.  Strengthening: Hip Abduction - Isometric    Using ball or folded pillow, push outside of right knee into wall. Hold __3__ seconds. Repeat __10__ times per set. Do _1___ sets per session. Do __2__ sessions per day.  http://orth.exer.us/610   Copyright  VHI. All rights reserved.

## 2017-04-27 NOTE — Therapy (Signed)
[facility] [place], [place], [address] Phone: [phone]   Fax:  [phone]  Physical Therapy Treatment  Patient Details  Name: Nicole [name] MRN: [id number] Date of Birth: Jun 30, 1946 Referring Provider: Marcial Pacas   Encounter Date: 04/27/2017      PT End of Session - 04/27/17 1330    Visit Number 5   Number of Visits 9   Date for PT Re-Evaluation 05/10/17   Authorization Type Medicare and TRICARE    Authorization Time Period 04/12/17 TO 05/12/17   Authorization - Visit Number 5   Authorization - Number of Visits 10   PT Start Time 4944   PT Stop Time 1337   PT Time Calculation (min) 40 min   Activity Tolerance Patient tolerated treatment well;Patient limited by fatigue;Patient limited by pain   Behavior During Therapy [facility] for tasks assessed/performed      Past Medical History:  Diagnosis Date  . AC (acromioclavicular) joint bone spurs    lt shoulder  . Anemia   . Arthritis   . Carpal tunnel syndrome, bilateral   . Gastroesophageal reflux   . Headache    recent visit to ER @ [facility] for severe headache  . Hypertension   . Lumbar stenosis    Hx of ESIs by Dr. Nelva Bush  . Polyarthralgia   . Polymyalgia ([place])   . Shingles   . Spinal stenosis     Past Surgical History:  Procedure Laterality Date  . CHOLECYSTECTOMY    . COLONOSCOPY  06/11/2012   Procedure: COLONOSCOPY;  Surgeon: Jamesetta So, MD;  [facility];  Service: Gastroenterology;  Laterality: N/A;  . HYSTEROSCOPY W/D&C N/A 02/25/2014   Procedure: DILATATION AND CURETTAGE /HYSTEROSCOPY;  Surgeon: Florian Buff, MD;  Location: [facility];  Service: Gynecology;  Laterality: N/A;  . POLYPECTOMY N/A 02/25/2014   Procedure: POLYPECTOMY;  Surgeon: Florian Buff, MD;  Location: [facility];  Service: Gynecology;  Laterality: N/A;  . RESECTION DISTAL CLAVICAL Right 03/26/2015   Procedure: OPEN DISTAL CLAVICAL RESECTION ;  Surgeon: Netta Cedars, MD;  Location: [place];  Service: Orthopedics;  Laterality: Right;    There were no vitals filed for this visit.      Subjective Assessment - 04/27/17 1256    Subjective Pt brought  HEP.  PT states that she is doing the exercises throughout the day.  She states that her body seems like it is getting weaker and she can't help it.  She called her MD but there was not appointment for her.  She did not make an appointment.    Pertinent History HTN, DDD, lumbar/thoracic/cervical stenosis, polymyalgia rheumatica    Patient Stated Goals walk better and get stronger    Currently in Pain? Yes   Pain Score 5    Pain Location Back   Pain Orientation Lower   Pain Descriptors / Indicators Aching;Sore   Pain Type Chronic pain   Pain Onset More than a month ago   Pain Frequency Intermittent   Aggravating Factors  walking,    Pain Relieving Factors sitting    Effect of Pain on Daily Activities increases                          [facility] Adult PT Treatment/Exercise - 04/27/17 0001      Bed Mobility   Bed Mobility Rolling Right;Rolling Left   Rolling Right 6: Modified independent (Device/Increase time)  x5 for improved core mm   Rolling Left --  x5 for improved core mm      Lumbar Exercises: Stretches   Lower Trunk Rotation 5 reps     Lumbar Exercises: Seated   Long Arc Quad on Chair Both;1 set;10 reps/ ankle heel raise/toe raise      Lumbar Exercises: Supine   Ab Set 10 reps   AB Set Limitations combined with glut sets    Glut Set 10 reps   Heel Slides 10 reps   Bridge 10 reps   Other Supine Lumbar Exercises pilates 40    Other Supine Lumbar Exercises isometric hip ABD and ADD 1x10 each, 3 second holds   LEs bent      Lumbar Exercises: Sidelying   Hip Abduction 10 reps                PT Education - 04/27/17 1329    Education provided Yes   Education Details given updated HEP   Person(s) Educated Patient   Methods Explanation   Comprehension Verbalized understanding           PT Short Term Goals - 04/12/17 1747      PT SHORT TERM GOAL #1   Title After 3 weeks patient will demonstrate improved strength and mobillty  AEB 5xSTS without use of UEs in less than 30 seconds    Time 2   Period Weeks   Status New     PT SHORT TERM GOAL #2   Title After 3 weeks pt will demonstrate improved tolerance to ambulation  able to sustain 3MWT at speed >0.3m/s.    Time 2   Period Weeks   Status New     PT SHORT TERM GOAL #3   Title Patient to be consistent in correctly performing HEP, to be modified as needed    Time 1   Period Weeks   Status New           PT Long Term Goals - 04/12/17 1749      PT LONG TERM GOAL #1   Title Patient to demonsrate improvement of at least 1 MMT grade in all tested groups in order to improve gait and mobility    Time 4   Period Weeks   Status New     PT LONG TERM GOAL #2   Title After 4 weeks pt will demonstrate improved tolerance to ambulation able to sustain 3MWT at speed >0.61m/s.   Time 4   Period Weeks   Status New     PT LONG TERM GOAL #3   Title After 6 weeks pt will demonstrate independence in an advanced HEP and independent exercise program at appopriate gym facility established by PT.    Time 4   Period Weeks   Status New               Plan - 04/27/17 1337    Clinical Impression Statement Pt needs assistance with all exercises involving her LT LE> Pt continues to show minimal progression   Rehab Potential Fair   Clinical Impairments Affecting Rehab Potential (+) motivated to particpate, moderate outcomes with skilled PT services; (-) complex PMH, severity and chronicity of impairments, general non-compliance with HPE    PT Frequency 2x / week   PT Duration 4 weeks   PT Treatment/Interventions ADLs/Self Care Home Management;Cryotherapy;Moist Heat;DME Instruction;Gait training;Stair training;Functional mobility training;Therapeutic activities;Balance training;Neuromuscular re-education;Therapeutic  exercise;Patient/family education;Orthotic Fit/Training;Manual techniques;Energy conservation   PT Next Visit Plan conintue working  on functional strength, pt unable to complete closed chain exercises. PT HEP from past therapy is appropriate added stabilization. Continue to plan DC on 7/19.    PT Home Exercise Plan has current HEP from prior therapy    Consulted and Agree with Plan of Care Patient      Patient will benefit from skilled therapeutic intervention in order to improve the following deficits and impairments:  Abnormal gait, Impaired sensation, Improper body mechanics, Pain, Decreased coordination, Postural dysfunction, Decreased activity tolerance, Decreased strength, Difficulty walking  Visit Diagnosis: Difficulty in walking, not elsewhere classified  Muscle weakness (generalized)  Other symptoms and signs involving the musculoskeletal system     Problem List Patient Active Problem List   Diagnosis Date Noted  . Memory loss 04/04/2017  . Polymyalgia rheumatica ([place]) 04/04/2017  . High risk medication use 12/07/2016  . PMR (polymyalgia rheumatica) (HCC) 09/27/2016  . DDD (degenerative disc disease), lumbar 09/27/2016  . HTN (hypertension) 06/05/2016  . Chronic pain syndrome 06/05/2016  . Acute diverticulitis 06/02/2016  . Diverticulitis large intestine 06/02/2016  . Spinal stenosis of lumbar region 02/29/2016  . Joint pain 02/29/2016  . Endometrial polyp 02/06/2014  . Postmenopausal bleeding 01/30/2014  . GERD (gastroesophageal reflux disease) 09/29/2013  . Spinal stenosis, thoracic 09/29/2013  . Shingles 09/29/2013  . Thoracic or lumbosacral neuritis or radiculitis, unspecified 05/04/2011  . Abnormality of gait 05/04/2011  . Muscle weakness (generalized) 05/04/2011  . KNEE, ARTHRITIS, DEGEN./OSTEO 04/07/2009  . KNEE PAIN 04/07/2009    Rayetta Humphrey, PT CLT [phone] 04/27/2017, 1:41 PM  [facility] [address] [place], [place], [address] Phone: [phone]   Fax:  [phone]  Name: Opel Lejeune MRN: [id number] Date of Birth: 01-Nov-1945

## 2017-04-30 ENCOUNTER — Ambulatory Visit (HOSPITAL_COMMUNITY): Payer: Medicare Other | Admitting: Physical Therapy

## 2017-04-30 DIAGNOSIS — R262 Difficulty in walking, not elsewhere classified: Secondary | ICD-10-CM | POA: Diagnosis not present

## 2017-04-30 DIAGNOSIS — R29898 Other symptoms and signs involving the musculoskeletal system: Secondary | ICD-10-CM | POA: Diagnosis not present

## 2017-04-30 DIAGNOSIS — M5442 Lumbago with sciatica, left side: Secondary | ICD-10-CM

## 2017-04-30 DIAGNOSIS — M25551 Pain in right hip: Secondary | ICD-10-CM

## 2017-04-30 DIAGNOSIS — M6281 Muscle weakness (generalized): Secondary | ICD-10-CM

## 2017-04-30 DIAGNOSIS — G8929 Other chronic pain: Secondary | ICD-10-CM

## 2017-04-30 NOTE — Therapy (Signed)
[facility] [place], [place], [address] Phone: [phone]   Fax:  [phone]  Physical Therapy Treatment  Patient Details  Name: Nicole [name] MRN: [id number] Date of Birth: 1945-11-23 Referring Provider: Marcial Pacas   Encounter Date: 04/30/2017      PT End of Session - 04/30/17 1737    Visit Number 6   Number of Visits 9   Date for PT Re-Evaluation 05/10/17   Authorization Type Medicare and TRICARE    Authorization Time Period 04/12/17 TO 05/12/17   Authorization - Visit Number 6   Authorization - Number of Visits 10   PT Start Time 9892   PT Stop Time 1730   PT Time Calculation (min) 41 min   Activity Tolerance Patient tolerated treatment well;Patient limited by fatigue   Behavior During Therapy [facility] for tasks assessed/performed      Past Medical History:  Diagnosis Date  . AC (acromioclavicular) joint bone spurs    lt shoulder  . Anemia   . Arthritis   . Carpal tunnel syndrome, bilateral   . Gastroesophageal reflux   . Headache    recent visit to ER @ [facility] for severe headache  . Hypertension   . Lumbar stenosis    Hx of ESIs by Dr. Nelva Bush  . Polyarthralgia   . Polymyalgia ([place])   . Shingles   . Spinal stenosis     Past Surgical History:  Procedure Laterality Date  . CHOLECYSTECTOMY    . COLONOSCOPY  06/11/2012   Procedure: COLONOSCOPY;  Surgeon: Jamesetta So, MD;  [facility];  Service: Gastroenterology;  Laterality: N/A;  . HYSTEROSCOPY W/D&C N/A 02/25/2014   Procedure: DILATATION AND CURETTAGE /HYSTEROSCOPY;  Surgeon: Florian Buff, MD;  [facility];  Service: Gynecology;  Laterality: N/A;  . POLYPECTOMY N/A 02/25/2014   Procedure: POLYPECTOMY;  Surgeon: Florian Buff, MD;  Location: [facility];  Service: Gynecology;  Laterality: N/A;  . RESECTION DISTAL CLAVICAL Right 03/26/2015   Procedure: OPEN DISTAL CLAVICAL RESECTION ;  Surgeon: Netta Cedars, MD;  Location: [facility];  Service:  Orthopedics;  Laterality: Right;    There were no vitals filed for this visit.      Subjective Assessment - 04/30/17 1652    Subjective Patient arrives today continuing to state taht she is getting weaker and weaker and there is not a lot she's been able to do to change this. Last time she brought her current HEP and therapist who saw her added some exercises.  She has not heard back from [facility] regarding her brace.  She continues to relate her inability to do much to her possible diverticulitis.    Pertinent History HTN, DDD, lumbar/thoracic/cervical stenosis, polymyalgia rheumatica    Currently in Pain? Yes   Pain Score 3    Pain Location Leg   Pain Orientation Left   Pain Descriptors / Indicators Burning;Dull;Aching                         [facility] Adult PT Treatment/Exercise - 04/30/17 0001      Lumbar Exercises: Seated   Long Arc Quad on Chair Both;1 set;20 reps   LAQ on Chair Limitations seated ankle DF 1x20 no resistance limited ROM due to chronic weakness L; seated marches with weighted ball 1x10 each side     LAQ on Ball Limitations trunk rotations with yellow 2kg ball 2x10; hamstring curls  1x20 B  Sit to Stand 10 reps   Sit to Stand Limitations no HHA                 PT Education - 04/30/17 1735    Education provided Yes   Education Details encouraged patient to go to water exercise with her neice who has said she will help her get in the pool; check out water PT programs possibly in [place]; encuoraged HEP compliance, corrected HEP form    Person(s) Educated Patient   Methods Explanation   Comprehension Verbalized understanding          PT Short Term Goals - 04/12/17 1747      PT SHORT TERM GOAL #1   Title After 3 weeks patient will demonstrate improved strength and mobillty  AEB 5xSTS without use of UEs in less than 30 seconds    Time 2   Period Weeks   Status New     PT SHORT TERM GOAL #2   Title After 3 weeks pt will demonstrate  improved tolerance to ambulation  able to sustain 3MWT at speed >0.44m/s.    Time 2   Period Weeks   Status New     PT SHORT TERM GOAL #3   Title Patient to be consistent in correctly performing HEP, to be modified as needed    Time 1   Period Weeks   Status New           PT Long Term Goals - 04/12/17 1749      PT LONG TERM GOAL #1   Title Patient to demonsrate improvement of at least 1 MMT grade in all tested groups in order to improve gait and mobility    Time 4   Period Weeks   Status New     PT LONG TERM GOAL #2   Title After 4 weeks pt will demonstrate improved tolerance to ambulation able to sustain 3MWT at speed >0.23m/s.   Time 4   Period Weeks   Status New     PT LONG TERM GOAL #3   Title After 6 weeks pt will demonstrate independence in an advanced HEP and independent exercise program at appopriate gym facility established by PT.    Time 4   Period Weeks   Status New               Plan - 04/30/17 1737    Clinical Impression Statement Patient arrives today continuing to express disappointment and frustration- "I just keep getting weaker and it feels like I can't do much about it", also continuing to attribute this trend to possible diverticulitis, she plans to see her PCP about checking this out. Attempted to progress patient this session with more seated and standing exercises today as tolerated; continue to note chronic deconditioning as well as generally poor prognosis moving forwards, continue to plan hard DC at end of prescribed POC period.    Rehab Potential Fair   Clinical Impairments Affecting Rehab Potential (+) motivated to particpate, moderate outcomes with skilled PT services; (-) complex PMH, severity and chronicity of impairments, general non-compliance with HPE    PT Frequency 2x / week   PT Duration 4 weeks   PT Treatment/Interventions ADLs/Self Care Home Management;Cryotherapy;Moist Heat;DME Instruction;Gait training;Stair  training;Functional mobility training;Therapeutic activities;Balance training;Neuromuscular re-education;Therapeutic exercise;Patient/family education;Orthotic Fit/Training;Manual techniques;Energy conservation   PT Next Visit Plan continue working on seated strength, continue to encourage correct HEP performance    PT Home Exercise Plan has current HEP from prior therapy  Consulted and Agree with Plan of Care Patient      Patient will benefit from skilled therapeutic intervention in order to improve the following deficits and impairments:  Abnormal gait, Impaired sensation, Improper body mechanics, Pain, Decreased coordination, Postural dysfunction, Decreased activity tolerance, Decreased strength, Difficulty walking  Visit Diagnosis: Difficulty in walking, not elsewhere classified  Muscle weakness (generalized)  Other symptoms and signs involving the musculoskeletal system  Pain in right hip  Chronic midline low back pain with left-sided sciatica     Problem List Patient Active Problem List   Diagnosis Date Noted  . Memory loss 04/04/2017  . Polymyalgia rheumatica ([place]) 04/04/2017  . High risk medication use 12/07/2016  . PMR (polymyalgia rheumatica) (HCC) 09/27/2016  . DDD (degenerative disc disease), lumbar 09/27/2016  . HTN (hypertension) 06/05/2016  . Chronic pain syndrome 06/05/2016  . Acute diverticulitis 06/02/2016  . Diverticulitis large intestine 06/02/2016  . Spinal stenosis of lumbar region 02/29/2016  . Joint pain 02/29/2016  . Endometrial polyp 02/06/2014  . Postmenopausal bleeding 01/30/2014  . GERD (gastroesophageal reflux disease) 09/29/2013  . Spinal stenosis, thoracic 09/29/2013  . Shingles 09/29/2013  . Thoracic or lumbosacral neuritis or radiculitis, unspecified 05/04/2011  . Abnormality of gait 05/04/2011  . Muscle weakness (generalized) 05/04/2011  . KNEE, ARTHRITIS, DEGEN./OSTEO 04/07/2009  . KNEE PAIN 04/07/2009    Deniece Ree PT,  DPT [facility] [address] [place], [place], [address] Phone: [phone]   Fax:  [phone]  Name: Nicole [name] MRN: [id number] Date of Birth: Jan 13, 1946

## 2017-05-02 DIAGNOSIS — R5383 Other fatigue: Secondary | ICD-10-CM | POA: Diagnosis not present

## 2017-05-02 DIAGNOSIS — E538 Deficiency of other specified B group vitamins: Secondary | ICD-10-CM | POA: Diagnosis not present

## 2017-05-02 DIAGNOSIS — E559 Vitamin D deficiency, unspecified: Secondary | ICD-10-CM | POA: Diagnosis not present

## 2017-05-02 DIAGNOSIS — Z6841 Body Mass Index (BMI) 40.0 and over, adult: Secondary | ICD-10-CM | POA: Diagnosis not present

## 2017-05-02 DIAGNOSIS — N63 Unspecified lump in unspecified breast: Secondary | ICD-10-CM | POA: Diagnosis not present

## 2017-05-02 DIAGNOSIS — Z1389 Encounter for screening for other disorder: Secondary | ICD-10-CM | POA: Diagnosis not present

## 2017-05-02 DIAGNOSIS — N644 Mastodynia: Secondary | ICD-10-CM | POA: Diagnosis not present

## 2017-05-02 DIAGNOSIS — B0229 Other postherpetic nervous system involvement: Secondary | ICD-10-CM | POA: Diagnosis not present

## 2017-05-02 DIAGNOSIS — M069 Rheumatoid arthritis, unspecified: Secondary | ICD-10-CM | POA: Diagnosis not present

## 2017-05-03 ENCOUNTER — Ambulatory Visit (HOSPITAL_COMMUNITY): Payer: Medicare Other | Admitting: Physical Therapy

## 2017-05-03 DIAGNOSIS — R262 Difficulty in walking, not elsewhere classified: Secondary | ICD-10-CM

## 2017-05-03 DIAGNOSIS — G8929 Other chronic pain: Secondary | ICD-10-CM

## 2017-05-03 DIAGNOSIS — M5442 Lumbago with sciatica, left side: Secondary | ICD-10-CM | POA: Diagnosis not present

## 2017-05-03 DIAGNOSIS — M25551 Pain in right hip: Secondary | ICD-10-CM | POA: Diagnosis not present

## 2017-05-03 DIAGNOSIS — M6281 Muscle weakness (generalized): Secondary | ICD-10-CM

## 2017-05-03 DIAGNOSIS — R29898 Other symptoms and signs involving the musculoskeletal system: Secondary | ICD-10-CM | POA: Diagnosis not present

## 2017-05-03 NOTE — Therapy (Signed)
[facility] [place], [place], [address] Phone: [phone]   Fax:  [phone]  Physical Therapy Treatment  Patient Details  Name: Nicole [name] MRN: [id number] Date of Birth: 1946-04-19 Referring Provider: Marcial Pacas   Encounter Date: 05/03/2017      PT End of Session - 05/03/17 1103    Visit Number 7   Number of Visits 7   Date for PT Re-Evaluation 05/10/17   Authorization Type Medicare and TRICARE    Authorization Time Period 04/12/17 TO 05/12/17   Authorization - Visit Number 7   Authorization - Number of Visits 7   PT Start Time 1025   PT Stop Time 1100   PT Time Calculation (min) 35 min   Activity Tolerance Patient limited by fatigue;Patient limited by pain   Behavior During Therapy [facility] for tasks assessed/performed      Past Medical History:  Diagnosis Date  . AC (acromioclavicular) joint bone spurs    lt shoulder  . Anemia   . Arthritis   . Carpal tunnel syndrome, bilateral   . Gastroesophageal reflux   . Headache    recent visit to ER @ [facility] for severe headache  . Hypertension   . Lumbar stenosis    Hx of ESIs by Dr. Nelva Bush  . Polyarthralgia   . Polymyalgia ([place])   . Shingles   . Spinal stenosis     Past Surgical History:  Procedure Laterality Date  . CHOLECYSTECTOMY    . COLONOSCOPY  06/11/2012   Procedure: COLONOSCOPY;  Surgeon: Jamesetta So, MD;  [facility];  Service: Gastroenterology;  Laterality: N/A;  . HYSTEROSCOPY W/D&C N/A 02/25/2014   Procedure: DILATATION AND CURETTAGE /HYSTEROSCOPY;  Surgeon: Florian Buff, MD;  Location: [facility] ORS;  Service: Gynecology;  Laterality: N/A;  . POLYPECTOMY N/A 02/25/2014   Procedure: POLYPECTOMY;  Surgeon: Florian Buff, MD;  Location: [facility];  Service: Gynecology;  Laterality: N/A;  . RESECTION DISTAL CLAVICAL Right 03/26/2015   Procedure: OPEN DISTAL CLAVICAL RESECTION ;  Surgeon: Netta Cedars, MD;  Location: [facility];  Service: Orthopedics;   Laterality: Right;    There were no vitals filed for this visit.      Subjective Assessment - 05/03/17 1012    Subjective Pt went to the MD about her lack of energy.  She is going back for reports from her blood tests.  She goes to Dr. Nelva Bush tomorrow.  She is not doing her exercises as often as she should.   Pertinent History HTN, DDD, lumbar/thoracic/cervical stenosis, polymyalgia rheumatica    How long can you sit comfortably? pt states that she is rarely out of pain.  She sits a lot;    How long can you stand comfortably? immediate pain has to lean over    How long can you walk comfortably? immediate pain difficult to walk without leaning over her walker.  The longest she walks is when she is in therapy.    Patient Stated Goals to be able to walk longer not met.     Currently in Pain? Yes   Pain Score 8    Pain Location Back   Pain Orientation Lower   Pain Descriptors / Indicators Sharp;Burning   Pain Type Chronic pain   Pain Radiating Towards to Lt leg    Pain Onset More than a month ago   Pain Frequency Constant   Aggravating Factors  walking  [facility] PT Assessment - 05/03/17 0001      Assessment   Medical Diagnosis gait difficulty    Referring Provider Marcial Pacas    Onset Date/Surgical Date --   Next MD Visit Dr. Krista Blue September 2018   Prior Therapy PT late 2017/early 2018 25 visits total      Precautions   Precautions Fall     Balance Screen   Has the patient fallen in the past 6 months No   Has the patient had a decrease in activity level because of a fear of falling?  No   Is the patient reluctant to leave their home because of a fear of falling?  No     Prior Function   Level of Independence Independent;Independent with basic ADLs;Needs assistance with ADLs;Requires assistive device for independence   Vocation Retired     Charity fundraiser Status Within Functional Limits for tasks assessed     Strength   Right Hip Flexion 2+/5  was  3/5    Right Hip ABduction 2/5  was 2/5   Right Hip ADduction 2/5   Left Hip Flexion 2+/5  was 3-   Left Hip ABduction 2-/5  was 2/5   Right Knee Extension 4/5  was 4-/5    Left Knee Extension 3+/5  was 3+/5    Right Ankle Dorsiflexion 3+/5  was 3/5   Left Ankle Dorsiflexion 2-/5  was 2/5     Transfers   Five time sit to stand comments  only able to complet on sit to stand was able to complete 5      6 minute walk test results    Aerobic Endurance Distance Walked 226   Endurance additional comments 3MWT, 0.60ms                      [facility] Adult PT Treatment/Exercise - 05/03/17 0001      Lumbar Exercises: Seated   Long Arc Quad on Chair Both;10 reps   LAQ on Chair Limitations hip adduction with glut set x 10    Sit to Stand 5 reps   Sit to Stand Limitations ab set; scapular retraction; glut set x1 0      Lumbar Exercises: Supine   Heel Slides 5 reps   Other Supine Lumbar Exercises hip abduction B x 5                   PT Short Term Goals - 05/03/17 1106      PT SHORT TERM GOAL #1   Title After 3 weeks patient will demonstrate improved strength and mobillty  AEB 5xSTS without use of UEs in less than 30 seconds    Time 2   Period Weeks   Status Not Met     PT SHORT TERM GOAL #2   Title After 3 weeks pt will demonstrate improved tolerance to ambulation  able to sustain 3MWT at speed >0.539m.    Time 2   Period Weeks   Status Not Met     PT SHORT TERM GOAL #3   Title Patient to be consistent in correctly performing HEP, to be modified as needed    Time 1   Period Weeks   Status Not Met           PT Long Term Goals - 05/03/17 1107      PT LONG TERM GOAL #1   Title Patient to demonsrate improvement of at least 1 MMT grade  in all tested groups in order to improve gait and mobility    Time 4   Period Weeks   Status Not Met     PT LONG TERM GOAL #2   Title After 4 weeks pt will demonstrate improved tolerance to ambulation able to  sustain 3MWT at speed >0.23ms.   Time 4   Period Weeks   Status Not Met     PT LONG TERM GOAL #3   Title After 6 weeks pt will demonstrate independence in an advanced HEP and independent exercise program at appopriate gym facility established by PT.    Time 4   Period Weeks   Status Not Met               Plan - [id number]/23/20181104    Clinical Impression Statement Pt reassessed today with decreased strength in all mm in pt Lt LE.  Rt LE is either the same or slightly better.  PT functional ability has declined with bed mobility and sit to stand being more difficuty and pt having more pain with activity.   Therapist recommends discharge as pt is not making any progress.  Pt is returning to her MD for further evaluation.     Rehab Potential Fair   Clinical Impairments Affecting Rehab Potential (+) motivated to particpate, moderate outcomes with skilled PT services; (-) complex PMH, severity and chronicity of impairments, general non-compliance with HPE    PT Frequency 2x / week   PT Duration 4 weeks   PT Treatment/Interventions ADLs/Self Care Home Management;Cryotherapy;Moist Heat;DME Instruction;Gait training;Stair training;Functional mobility training;Therapeutic activities;Balance training;Neuromuscular re-education;Therapeutic exercise;Patient/family education;Orthotic Fit/Training;Manual techniques;Energy conservation   PT Next Visit Plan continue working on seated strength, continue to encourage correct HEP performance    PT Home Exercise Plan has current HEP from prior therapy    Consulted and Agree with Plan of Care Patient      Patient will benefit from skilled therapeutic intervention in order to improve the following deficits and impairments:  Abnormal gait, Impaired sensation, Improper body mechanics, Pain, Decreased coordination, Postural dysfunction, Decreased activity tolerance, Decreased strength, Difficulty walking  Visit Diagnosis: Difficulty in walking, not elsewhere  classified  Muscle weakness (generalized)  Other symptoms and signs involving the musculoskeletal system  Pain in right hip  Chronic midline low back pain with left-sided sciatica       G-Codes - [id number]    Functional Assessment Tool Used (Outpatient Only) based on clinical assessment of strength, gait patter    Functional Limitation Mobility: Walking and moving around   Mobility: Walking and Moving Around Goal Status [phone] At least 40 percent but less than 60 percent impaired, limited or restricted   Mobility: Walking and Moving Around Discharge Status [phone] At least 60 percent but less than 80 percent impaired, limited or restricted      Problem List Patient Active Problem List   Diagnosis Date Noted  . Memory loss 04/04/2017  . Polymyalgia rheumatica ([place] 04/04/2017  . High risk medication use 12/07/2016  . PMR (polymyalgia rheumatica) ([facility]) 09/27/2016  . DDD (degenerative disc disease), lumbar 09/27/2016  . HTN (hypertension) 06/05/2016  . Chronic pain syndrome 06/05/2016  . Acute diverticulitis 06/02/2016  . Diverticulitis large intestine 06/02/2016  . Spinal stenosis of lumbar region 02/29/2016  . Joint pain 02/29/2016  . Endometrial polyp 02/06/2014  . Postmenopausal bleeding 01/30/2014  . GERD (gastroesophageal reflux disease) 09/29/2013  . Spinal stenosis, thoracic 09/29/2013  . Shingles 09/29/2013  . Thoracic or lumbosacral neuritis or  radiculitis, unspecified 05/04/2011  . Abnormality of gait 05/04/2011  . Muscle weakness (generalized) 05/04/2011  . KNEE, ARTHRITIS, DEGEN./OSTEO 04/07/2009  . KNEE PAIN 04/07/2009  Rayetta Humphrey, PT CLT [phone] 05/03/2017, 11:09 AM  [facility] [address] [place], [place], [address] Phone: [phone]   Fax:  [phone]  Name: Nicole [name] MRN: [id number] Date of Birth: Sep 26, 1946  PHYSICAL THERAPY DISCHARGE SUMMARY  Visits from Start of Care:  7  Current functional level related to goals / functional outcomes: As above    Remaining deficits: As above    Education / Equipment: HEP Plan: Patient agrees to discharge.  Patient goals were not met. Patient is being discharged due to lack of progress.  ?????       Rayetta Humphrey, [place] CLT [phone]

## 2017-05-04 DIAGNOSIS — G894 Chronic pain syndrome: Secondary | ICD-10-CM | POA: Diagnosis not present

## 2017-05-04 DIAGNOSIS — M5136 Other intervertebral disc degeneration, lumbar region: Secondary | ICD-10-CM | POA: Diagnosis not present

## 2017-05-07 ENCOUNTER — Ambulatory Visit (HOSPITAL_COMMUNITY): Payer: Medicare Other | Admitting: Physical Therapy

## 2017-05-09 DIAGNOSIS — Z6841 Body Mass Index (BMI) 40.0 and over, adult: Secondary | ICD-10-CM | POA: Diagnosis not present

## 2017-05-09 DIAGNOSIS — Z0000 Encounter for general adult medical examination without abnormal findings: Secondary | ICD-10-CM | POA: Diagnosis not present

## 2017-05-10 ENCOUNTER — Ambulatory Visit (HOSPITAL_COMMUNITY): Payer: Medicare Other | Admitting: Physical Therapy

## 2017-05-18 ENCOUNTER — Telehealth: Payer: Self-pay | Admitting: Neurology

## 2017-05-18 NOTE — Telephone Encounter (Signed)
I have spoken with pt. this morning.  Order for left AFO faxed to [employer] fax# [phone]

## 2017-05-18 NOTE — Telephone Encounter (Signed)
Pt called to inform that [employer]([phone]) has informed her that they would not accept a letter from her PCP but in fact needs one from Dr Krista Blue stating that pt needs the brace for her Left foot (re: her drop foot that she is going to physical therapy for)  Pt would like to know if Dr Krista Blue can prepare an order / letter stating that she is in need of the brace to go under her left foot.  Pt would like a call back as well please

## 2017-05-18 NOTE — Telephone Encounter (Signed)
I have spoken with pt.  Order for left AFO, office notes faxed to [employer] per her request/fim

## 2017-05-31 DIAGNOSIS — R768 Other specified abnormal immunological findings in serum: Secondary | ICD-10-CM | POA: Insufficient documentation

## 2017-05-31 DIAGNOSIS — Z7952 Long term (current) use of systemic steroids: Secondary | ICD-10-CM | POA: Insufficient documentation

## 2017-05-31 NOTE — Progress Notes (Signed)
Office Visit Note  Patient: Nicole [name]             Date of Birth: 05-Apr-1946           MRN: [id number]             PCP: Sharilyn Sites, MD Referring: Sharilyn Sites, MD Visit Date: 06/07/2017 Occupation: @GUAROCC @    Subjective:  Lower back pain.   History of Present Illness: Nicole [name] is a 71 y.o. female with history of polymyalgia rheumatica, osteoarthritis and disc disease. She states she's been having a lot of discomfort in her lower back. She has appointment with Dr. Nelva Bush for injection to her lower back. She's been trying to taper her prednisone and has come down to 2 mg of prednisone a day. She denies any increased muscle pain. She does have some radiculopathy from her lower back to her right lower extremity. She denies any joint swelling.  Activities of Daily Living:  Patient reports morning stiffness for 1  hour.   Patient Reports nocturnal pain.  Difficulty dressing/grooming: Denies Difficulty climbing stairs: Reports Difficulty getting out of chair: Reports Difficulty using hands for taps, buttons, cutlery, and/or writing: Denies   Review of Systems  Constitutional: Positive for fatigue and weakness. Negative for night sweats, weight gain and weight loss.  HENT: Positive for mouth dryness. Negative for mouth sores, trouble swallowing, trouble swallowing and nose dryness.   Eyes: Positive for dryness. Negative for pain, redness and visual disturbance.  Respiratory: Negative for cough, shortness of breath and difficulty breathing.   Cardiovascular: Negative.  Positive for hypertension. Negative for chest pain, palpitations, irregular heartbeat and swelling in legs/feet.  Gastrointestinal: Negative.  Negative for blood in stool, constipation and diarrhea.  Endocrine: Negative.  Negative for increased urination.  Genitourinary: Negative.  Negative for nocturia and vaginal dryness.  Musculoskeletal: Positive for arthralgias, joint pain, joint swelling, muscle weakness and  morning stiffness. Negative for myalgias, muscle tenderness and myalgias.  Skin: Positive for sensitivity to sunlight. Negative for color change, rash, hair loss, redness, skin tightness and ulcers.  Allergic/Immunologic: Negative for susceptible to infections.  Neurological: Negative for dizziness, headaches, memory loss and night sweats.  Hematological: Negative.  Negative for swollen glands.  Psychiatric/Behavioral: Negative.  Negative for depressed mood and sleep disturbance. The patient is not nervous/anxious.     PMFS History:  Patient Active Problem List   Diagnosis Date Noted  . On prednisone therapy 05/31/2017  . Positive anti-CCP test 05/31/2017  . Rheumatoid factor positive 05/31/2017  . Memory loss 04/04/2017  . Polymyalgia rheumatica ([place]) 04/04/2017  . High risk medication use 12/07/2016  . DDD (degenerative disc disease), lumbar 09/27/2016  . HTN (hypertension) 06/05/2016  . Chronic pain syndrome 06/05/2016  . Acute diverticulitis 06/02/2016  . Diverticulitis large intestine 06/02/2016  . Spinal stenosis of lumbar region 02/29/2016  . Joint pain 02/29/2016  . Endometrial polyp 02/06/2014  . Postmenopausal bleeding 01/30/2014  . GERD (gastroesophageal reflux disease) 09/29/2013  . Spinal stenosis, thoracic 09/29/2013  . Shingles 09/29/2013  . Thoracic or lumbosacral neuritis or radiculitis, unspecified 05/04/2011  . Abnormality of gait 05/04/2011  . Muscle weakness (generalized) 05/04/2011  . Bilateral primary osteoarthritis of knee 04/07/2009  . KNEE PAIN 04/07/2009    Past Medical History:  Diagnosis Date  . AC (acromioclavicular) joint bone spurs    lt shoulder  . Anemia   . Arthritis   . Carpal tunnel syndrome, bilateral   . Gastroesophageal reflux   . Headache  recent visit to ER @ [facility] for severe headache  . Hypertension   . Lumbar stenosis    Hx of ESIs by Dr. Nelva Bush  . Polyarthralgia   . Polymyalgia ([place])   . Shingles   . Spinal stenosis      Family History  Problem Relation Age of Onset  . Hypertension Mother   . Pneumonia Father    Past Surgical History:  Procedure Laterality Date  . CHOLECYSTECTOMY    . COLONOSCOPY  06/11/2012   Procedure: COLONOSCOPY;  Surgeon: Jamesetta So, MD;  [facility];  Service: Gastroenterology;  Laterality: N/A;  . HYSTEROSCOPY W/D&C N/A 02/25/2014   Procedure: DILATATION AND CURETTAGE /HYSTEROSCOPY;  Surgeon: Florian Buff, MD;  Location: [facility];  Service: Gynecology;  Laterality: N/A;  . POLYPECTOMY N/A 02/25/2014   Procedure: POLYPECTOMY;  Surgeon: Florian Buff, MD;  Location: [facility];  Service: Gynecology;  Laterality: N/A;  . RESECTION DISTAL CLAVICAL Right 03/26/2015   Procedure: OPEN DISTAL CLAVICAL RESECTION ;  Surgeon: Netta Cedars, MD;  Location: [facility];  Service: Orthopedics;  Laterality: Right;   Social History   Social History Narrative   Lives at home alone.   Right-handed.   1-2 cups caffeine daily.     Objective: Vital Signs: BP (!) 143/76   Pulse 95   Resp 14   Ht 5\' 2"  (1.575 m)   Wt 279 lb (126.6 kg)   BMI 51.03 kg/m    Physical Exam  Constitutional: She is oriented to person, place, and time. She appears well-developed and well-nourished.  HENT:  Head: Normocephalic and atraumatic.  Eyes: Conjunctivae and EOM are normal.  Neck: Normal range of motion.  Cardiovascular: Normal rate, regular rhythm, normal heart sounds and intact distal pulses.   Pulmonary/Chest: Effort normal and breath sounds normal.  Abdominal: Soft. Bowel sounds are normal.  Lymphadenopathy:    She has no cervical adenopathy.  Neurological: She is alert and oriented to person, place, and time.  Skin: Skin is warm and dry. Capillary refill takes less than 2 seconds.  Psychiatric: She has a normal mood and affect. Her behavior is normal.  Nursing note and vitals reviewed.    Musculoskeletal Exam: C-spine and thoracic lumbar spine right limited range of motion with discomfort.  Shoulder joint abduction limited to 90. Which is unchanged. Elbow joints wrist joint MCPs PIPs DIPs are good range of motion with no synovitis. Hip joints difficult to assess range of motion and she was in a chair. She has difficulty lifting her left lower extremity which she states is secondary to her lower back. No warmth or swelling was noted in her joints.  CDAI Exam: No CDAI exam completed.    Investigation: Findings:  April 20, 2016 shows CMP with GFR was elevated, creatinine 1.21, decreased GFR 53.  CBC with diff is normal.  Sed rate is elevated at 65.  CCP is elevated at 43.  Hep panel is negative.  TSH is normal.  IgG and IgM are normal.  IgM is elevated at 333.  SPEP is negative.   CBC Latest Ref Rng & Units 12/29/2016 09/29/2016 09/06/2016  WBC 3.8 - 10.8 K/uL 9.0 7.2 10.4  Hemoglobin 11.7 - 15.5 g/dL 11.7 11.6(L) 11.9  Hematocrit 35.0 - 45.0 % 35.5 35.4 35.8  Platelets 140 - 400 K/uL 303 301 321   CMP Latest Ref Rng & Units 12/29/2016 09/29/2016 09/06/2016  Glucose 65 - 99 mg/dL 77 90 67  BUN 7 -  25 mg/dL 20 22 21   Creatinine 0.60 - 0.93 mg/dL 1.34(H) 1.36(H) 1.17(H)  Sodium 135 - 146 mmol/L 143 143 142  Potassium 3.5 - 5.3 mmol/L 4.3 4.2 4.3  Chloride 98 - 110 mmol/L 107 105 100  CO2 20 - 31 mmol/L 27 27 27   Calcium 8.6 - 10.4 mg/dL 9.0 9.1 9.6  Total Protein 6.1 - 8.1 g/dL 6.4 6.5 7.0  Total Bilirubin 0.2 - 1.2 mg/dL 0.4 0.4 0.3  Alkaline Phos 33 - 130 U/L 121 87 119(H)  AST 10 - 35 U/L 16 14 21   ALT 6 - 29 U/L 16 15 21      Imaging: No results found.  Speciality Comments: No specialty comments available.    Procedures:  No procedures performed Allergies: Oxycontin [oxycodone hcl]; Sulfa antibiotics; Tramadol; and Penicillins   Assessment / Plan:     Visit Diagnoses: Polymyalgia rheumatica ([place]): She appears to be doing well without any muscle pain or increased muscle weakness. Although due to her underlying medical condition muscle strength was difficult to  assess. She does have left lower extremity weakness due to her disc disease. She's been followed by neurologist. She's been tapering her prednisone and is down to 2 mg a day now. We discussed tapering it by 1 mg every 2 months.  On prednisone therapy - 2 mg by mouth daily  - Plan: DG DXA FRACTURE ASSESSMENT  High risk medication use - Plaquenil 200 mg by mouth twice a day Monday to Friday - Plan: CBC with Differential/Platelet, COMPLETE METABOLIC PANEL WITH GFR today and then every 5 months  Bilateral primary osteoarthritis of knee: Chronic pain  Rheumatoid factor positive, Positive anti-CCP test: She is no synovitis on examination  Abnormality of gait: Due to left lower extremity weakness: Increased pain she has appointment coming up with Dr. Nelva Bush  Spinal stenosis, thoracic: Chronic pain  DDD (degenerative disc disease), lumbar/ also has spinal stenosis   History of hypertension  History of gastroesophageal reflux (GERD)  History of diverticulitis    Orders: Orders Placed This Encounter  Procedures  . DG DXA FRACTURE ASSESSMENT  . CBC with Differential/Platelet  . COMPLETE METABOLIC PANEL WITH GFR   No orders of the defined types were placed in this encounter.   Face-to-face time spent with patient was 30 minutes. Greater than 50% of time was spent in counseling and coordination of care.  Follow-Up Instructions: Return in about 6 months (around 12/08/2017) for Polymyalgia rheumatica, Osteoarthritis,DDD.   Bo Merino, MD  Note - This record has been created using Editor, commissioning.  Chart creation errors have been sought, but may not always  have been located. Such creation errors do not reflect on  the standard of medical care.

## 2017-06-07 ENCOUNTER — Encounter: Payer: Self-pay | Admitting: Rheumatology

## 2017-06-07 ENCOUNTER — Ambulatory Visit (INDEPENDENT_AMBULATORY_CARE_PROVIDER_SITE_OTHER): Payer: Medicare Other | Admitting: Rheumatology

## 2017-06-07 VITALS — BP 143/76 | HR 95 | Resp 14 | Ht 62.0 in | Wt 279.0 lb

## 2017-06-07 DIAGNOSIS — M5136 Other intervertebral disc degeneration, lumbar region: Secondary | ICD-10-CM | POA: Diagnosis not present

## 2017-06-07 DIAGNOSIS — Z79899 Other long term (current) drug therapy: Secondary | ICD-10-CM | POA: Diagnosis not present

## 2017-06-07 DIAGNOSIS — R269 Unspecified abnormalities of gait and mobility: Secondary | ICD-10-CM

## 2017-06-07 DIAGNOSIS — M4804 Spinal stenosis, thoracic region: Secondary | ICD-10-CM | POA: Diagnosis not present

## 2017-06-07 DIAGNOSIS — M353 Polymyalgia rheumatica: Secondary | ICD-10-CM | POA: Diagnosis not present

## 2017-06-07 DIAGNOSIS — Z7952 Long term (current) use of systemic steroids: Secondary | ICD-10-CM | POA: Diagnosis not present

## 2017-06-07 DIAGNOSIS — R768 Other specified abnormal immunological findings in serum: Secondary | ICD-10-CM | POA: Diagnosis not present

## 2017-06-07 DIAGNOSIS — Z8679 Personal history of other diseases of the circulatory system: Secondary | ICD-10-CM

## 2017-06-07 DIAGNOSIS — Z8719 Personal history of other diseases of the digestive system: Secondary | ICD-10-CM | POA: Diagnosis not present

## 2017-06-07 DIAGNOSIS — M170 Bilateral primary osteoarthritis of knee: Secondary | ICD-10-CM

## 2017-06-07 LAB — CBC WITH DIFFERENTIAL/PLATELET
Basophils Absolute: 0 cells/uL (ref 0–200)
Basophils Relative: 0 %
Eosinophils Absolute: 324 cells/uL (ref 15–500)
Eosinophils Relative: 4 %
HCT: 37 % (ref 35.0–45.0)
Hemoglobin: 12.2 g/dL (ref 11.7–15.5)
Lymphocytes Relative: 34 %
Lymphs Abs: 2754 cells/uL (ref 850–3900)
MCH: 29.3 pg (ref 27.0–33.0)
MCHC: 33 g/dL (ref 32.0–36.0)
MCV: 88.7 fL (ref 80.0–100.0)
MPV: 9.1 fL (ref 7.5–12.5)
Monocytes Absolute: 567 cells/uL (ref 200–950)
Monocytes Relative: 7 %
Neutro Abs: 4455 cells/uL (ref 1500–7800)
Neutrophils Relative %: 55 %
Platelets: 338 10*3/uL (ref 140–400)
RBC: 4.17 MIL/uL (ref 3.80–5.10)
RDW: 14.7 % (ref 11.0–15.0)
WBC: 8.1 10*3/uL (ref 3.8–10.8)

## 2017-06-07 NOTE — Progress Notes (Signed)
Rheumatology Medication Review by a Pharmacist Does the patient feel that his/her medications are working for him/her?  Yes Has the patient been experiencing any side effects to the medications prescribed?  No Does the patient have any problems obtaining medications?  No  Issues to address at subsequent visits: None   Pharmacist comments:  Nicole [name] is a pleasant 71 yo F who presents for follow up of polymyalgia rheumatica.  She is currently taking prednisone 2 mg daily and hydroxychloroquine 200 mg BID Monday through Friday.  [name] recent standing labs were from 01/08/17.  Patient is due for standing labs today.  [name] recent hydroxychloroquine eye exam was normal on 12/07/16 which was normal.    Noted patient was prescribed ergocalciferol for vitamin D deficiency.  Patient reports Dr. Hilma [name] checked repeat vitamin D last month.  I called their office for copay of her labs.  Patient denies any questions or concerns regarding her medications at this time.   Nicole [name], Pharm.D., BCPS, CPP Clinical Pharmacist Pager: [phone] Phone: [phone] 06/07/2017 11:27 AM

## 2017-06-08 LAB — COMPLETE METABOLIC PANEL WITHOUT GFR
ALT: 18 U/L (ref 6–29)
AST: 16 U/L (ref 10–35)
Albumin: 3.7 g/dL (ref 3.6–5.1)
Alkaline Phosphatase: 125 U/L (ref 33–130)
BUN: 21 mg/dL (ref 7–25)
CO2: 24 mmol/L (ref 20–32)
Calcium: 9.3 mg/dL (ref 8.6–10.4)
Chloride: 103 mmol/L (ref 98–110)
Creat: 1.43 mg/dL — ABNORMAL HIGH (ref 0.60–0.93)
GFR, Est African American: 43 mL/min — ABNORMAL LOW
GFR, Est Non African American: 37 mL/min — ABNORMAL LOW
Glucose, Bld: 84 mg/dL (ref 65–99)
Potassium: 4.5 mmol/L (ref 3.5–5.3)
Sodium: 139 mmol/L (ref 135–146)
Total Bilirubin: 0.3 mg/dL (ref 0.2–1.2)
Total Protein: 6.5 g/dL (ref 6.1–8.1)

## 2017-06-08 NOTE — Progress Notes (Signed)
Labs are stable. Please fax to her PCP.

## 2017-06-17 DIAGNOSIS — R05 Cough: Secondary | ICD-10-CM | POA: Diagnosis not present

## 2017-06-17 DIAGNOSIS — J0100 Acute maxillary sinusitis, unspecified: Secondary | ICD-10-CM | POA: Diagnosis not present

## 2017-06-27 ENCOUNTER — Other Ambulatory Visit (HOSPITAL_COMMUNITY): Payer: Self-pay | Admitting: Family Medicine

## 2017-06-29 ENCOUNTER — Telehealth: Payer: Self-pay | Admitting: *Deleted

## 2017-06-29 DIAGNOSIS — E559 Vitamin D deficiency, unspecified: Secondary | ICD-10-CM

## 2017-06-29 NOTE — Telephone Encounter (Signed)
Refill request received for Vitamin D. Patient advised we need to have her come to the office for it to be rechecked before refilling. Patient will come 07/04/17.

## 2017-07-03 ENCOUNTER — Other Ambulatory Visit: Payer: Self-pay | Admitting: Family Medicine

## 2017-07-03 DIAGNOSIS — Z1231 Encounter for screening mammogram for malignant neoplasm of breast: Secondary | ICD-10-CM

## 2017-07-09 ENCOUNTER — Telehealth: Payer: Self-pay | Admitting: Neurology

## 2017-07-09 NOTE — Telephone Encounter (Signed)
Patient called office in reference to needing to reschedule appointment with Dr. Krista Blue 07/10/17 due to not having anyone to come to the appointment with her.  Patient has a bone scan on 07/12/17 and would like to come in after to be sure Dr. Krista Blue has the results.  Next available is December 4th are we able to work patient in sooner?  Please call

## 2017-07-10 ENCOUNTER — Ambulatory Visit: Payer: Medicare Other | Admitting: Neurology

## 2017-07-10 NOTE — Telephone Encounter (Signed)
Patient has been scheduled to see Dr. Krista Blue 08/16/17 @ 1:00pm.

## 2017-07-12 ENCOUNTER — Encounter: Payer: Self-pay | Admitting: Rheumatology

## 2017-07-12 DIAGNOSIS — Z1231 Encounter for screening mammogram for malignant neoplasm of breast: Secondary | ICD-10-CM | POA: Diagnosis not present

## 2017-07-12 DIAGNOSIS — Z780 Asymptomatic menopausal state: Secondary | ICD-10-CM | POA: Diagnosis not present

## 2017-07-18 ENCOUNTER — Telehealth: Payer: Self-pay | Admitting: Rheumatology

## 2017-07-18 NOTE — Telephone Encounter (Signed)
Patient states she had a DEXA scan done last Thursday and would like to know if we have received those results. Please advise.

## 2017-07-18 NOTE — Telephone Encounter (Signed)
Patient advised we do not have have the results. Patient states she will call once she gets home to advise where she went so we can obtain results and review.

## 2017-07-19 DIAGNOSIS — M545 Low back pain: Secondary | ICD-10-CM | POA: Diagnosis not present

## 2017-07-19 NOTE — Telephone Encounter (Signed)
Opened in error

## 2017-07-20 ENCOUNTER — Telehealth: Payer: Self-pay | Admitting: Neurology

## 2017-07-20 NOTE — Telephone Encounter (Signed)
Pt calling in hopes that Dr Krista Blue will have access to the results of the Bone Scan(this was done at [facility]  [phone] )[address], [place], [place] [address] pt had on 9-20 and to also have Dr Krista Blue be aware of the back injection she had on 9-27.    If more information is needed before pt appointment she is asking to be called please

## 2017-07-20 NOTE — Telephone Encounter (Signed)
Please get bone scan report

## 2017-07-24 ENCOUNTER — Telehealth: Payer: Self-pay | Admitting: *Deleted

## 2017-07-24 NOTE — Telephone Encounter (Signed)
Bone Density Scan T-Score -0.8 WNL  Repeat in 5 years.   Patient advised.

## 2017-08-08 ENCOUNTER — Telehealth: Payer: Self-pay | Admitting: Rheumatology

## 2017-08-08 DIAGNOSIS — G894 Chronic pain syndrome: Secondary | ICD-10-CM | POA: Diagnosis not present

## 2017-08-08 DIAGNOSIS — M4807 Spinal stenosis, lumbosacral region: Secondary | ICD-10-CM | POA: Diagnosis not present

## 2017-08-08 MED ORDER — HYDROXYCHLOROQUINE SULFATE 200 MG PO TABS: ORAL_TABLET | ORAL | 0 refills | Status: DC

## 2017-08-08 NOTE — Telephone Encounter (Signed)
Last Visit: 06/07/17 Next Visit: 12/07/17 Labs: 06/07/17 Stable PLQ Eye Exam: 12/07/16 WNL  Okay to send 30 day supply to local pharmacy and 90 day to mail order per Dr. Estanislado Pandy  Patient advised prescription sent to the pharmacy.

## 2017-08-08 NOTE — Telephone Encounter (Signed)
Patient will be out of Plaquenil after dose at noon, and will not have dose for this pm. Patient called [facility] to request refill for 90 days. [facility] needs a new rx, and patient needs some before they can get rx to her. Please call to advise. Patient uses [facility] locally, if you need that also.

## 2017-08-16 ENCOUNTER — Encounter: Payer: Self-pay | Admitting: Neurology

## 2017-08-16 ENCOUNTER — Ambulatory Visit (INDEPENDENT_AMBULATORY_CARE_PROVIDER_SITE_OTHER): Payer: Medicare Other | Admitting: Neurology

## 2017-08-16 VITALS — BP 140/76 | HR 99

## 2017-08-16 DIAGNOSIS — M48062 Spinal stenosis, lumbar region with neurogenic claudication: Secondary | ICD-10-CM

## 2017-08-16 DIAGNOSIS — R269 Unspecified abnormalities of gait and mobility: Secondary | ICD-10-CM | POA: Diagnosis not present

## 2017-08-16 MED ORDER — DULOXETINE HCL 60 MG PO CPEP: 60.0000 mg | ORAL_CAPSULE | Freq: Every day | ORAL | 12 refills | Status: DC

## 2017-08-16 NOTE — Progress Notes (Signed)
Chief Complaint  Patient presents with  . Lumbar Stenosis/Polymyalgia Rheumatica      PATIENT: Nicole [name] DOB: [birth year]  Chief Complaint  Patient presents with  . Lumbar Stenosis/Polymyalgia Rheumatica     HISTORICAL  Nicole [name] is a 71 years old right-handed female, accompanied by her sister-in-law Hoyle Sauer, seen in refer by her primary care physician  Dr. Sharilyn Sites for evaluation of diffuse body achy pain, gait difficulty in Feb 29 2016.  I reviewed and summarized referring note, she had a history of hypertension, hyperlipidemia, osteoarthritis, history of chronic low back pain, lumbar stenosis, has been treated by pain management Dr. Nelva Bush since 2013  She had a history of chronic low back pain, progressively worse over the past few years, radiating pain to bilateral lower extremity, left worse than right, left leg give out underneath her, prior to December 2016, she was able to ambulate independently, but had rapid decline since, she complains of diffuse body achy pain, multiple joints pain, worsening gait difficulty, has to rely on her walker   to take few steps at her house,  She denies bowel and bladder incontinence, she also complains of worsening nocturia, she is now taking Nucynta 50 mg 3 times a day, with limited help of her pain, complains of drowsiness Per record, in April 2017, she was treated with methylprednisolone 4 mg tapering dose, diclofenac 75 mg twice a day, muscle relaxant Flexeril 5 mg every night, patient reported limited help from those polypharmacy. Tramadol makes her confused, falling backwards  I reviewed laboratory evaluation in 2017, normal CBC, WBC 6.5, hemoglobin 13. 4, normal CMP, with exception of elevated creatinine 1.1 9, elevated LDL 177, cholesterol 266, triglycerides right 165, mildly low vitamin D 25.9, urine culture was negative,  rheumatoid factor was elevated 44.5, ANA was negative, Lyme titer was negative 0.9, hepatitis function showed mild  elevated ALT 39, hepatitis panel A, B, C was negative, ionized calcium was normal 5.3, alkaline phosphate was high at 120, normal TSH 1.96, cortisone level was normal folic acid was normal 8.6, vitamin B12 597, PTH 48, esr rate was 56 elevated  I was able to review her MRI of lumbar in 2009 from [facility]ologist, severe lumbar stenosis at L 3-4, MRI of cervical spine multilevel cervical degenerative disc disease, no significant canal stenosis, no cord signal changes. MRI of the brain, mild atrophy, no acute lesions.  She lives alone, can barely maintaining independence because of her muscle pain, joints pain gait abnormality  UPDATE April 13 2016:  She came in for electrodiagnostic study today, which demonstrate active bilateral lumbar sacral radiculopathy. She has severe low back pain, limited range of motion, unsteady gait, at risk for fall,  she denies bowel and bladder incontinence,  I have referred her to neurosurgeon for lumbar decompression surgery,  UPDATE July 18th 2017: She was seen by Dr. Trenton Gammon in July, who recommended epidural injection by Dr. Nelva Bush, will try injection first, if not responsive, may consider surgery,  She was offered to have lumbar decompression surgery in 2009, she decided to hold off then.    She now complains of severe low back pain, left side radiating pain, she has no bowel and bladder incontinence, she has significant abnormality,   We have personally reviewed MRI lumbar in July 2017: showing severe spinal canal stenosis at L3-4 and L4-5 with bilateral severe foraminal narrowing at these levels with impingement on the exiting nerve roots. L1-L2 shows large disc osteophyte protrusion with severe foraminal compression on the  right and encroachment on exiting nerve roots as well  UPDATE Nov 29th 2017: She continues to have significant low back pain, gait abnormality, we again reviewed MRI lumbar in July 2017, there is significant lumbar stenosis at L3-4, L4-5. She  denies bowel and bladder incontinence, epidural injection in summer of 2017 only provide temporary relief.  She is also under the care of rheumatologist Dr. Patrecia Pour, taking prednisone 22m daily, per patient, will be switched to Plaquenil soon.  We have personally reviewed MRI cervical spine in August 2017, multilevel degenerative disc disease, most severe C6-7, facet hypertrophy, bulging disc, moderate to severe bilateral foraminal stenosis.  UPDATE April 04 2017: Reviewed laboratory evaluation low vitamin D 21, CMP showed mild elevated creatinine 1.34, normal CBC, elevated ESR 66,  She was recently diagnosed with polymyalgia rheumatica, was treated with tapering dose of prednisone, now coming down to 2 mg daily,  She continue to complains of diffuse body achy pain, low back pain, significant gait abnormality, but had some improvement after physical therapy,  She could not tolerate high-dose of gabapentin 300 mg 3 times a day cause her falling backwards, she is only taking 100 mg 2 tablets 3 times a day  We have personally reviewed MRI of in July 2017, severe spinal stenosis at L3-4, L4-5, with bilateral severe foraminal narrowing, nerve compression, MRI of cervical spine showed multilevel degenerative disc disease, with evidence of severe bilateral foraminal stenosis at bilateral C3-4 C4-5, C5-6, no evidence of canal stenosis  Per patient, she was evaluated by neurosurgeon, will try conservative treatment at this point, will try to get the record,  UPDATE Aug 16 2017: She is alone at today's clinical visit complains worsening low back pain, had epidural injection 6 weeks ago, with only partial relief of her low back pain,  She is going to have another epidural injection soon, we again reviewed MRI of lumbar spine showed severe lumbar stenosis at L3-4, L4-5 level with severe bilateral foraminal narrowing.   I called her daughter AElmo Puttat her work, explained the findings to her, will refer  her back to neurosurgeon for possible decompression   Laboratory evaluation in August 2018, creatinine 1.43, normal CBC, C-reactive protein was mildly elevated 8.4, normal ESR of 18, A1c was 5.7, negative ANA, B12 was 673, normal TSH, CPK,  Bone scan on July 12 2017 was normal T score was 0.8  REVIEW OF SYSTEMS: Full 14 system review of systems performed and notable only for as above  ALLERGIES: Allergies  Allergen Reactions  . Oxycontin [Oxycodone Hcl] Swelling  . Sulfa Antibiotics Other (See Comments)    Sores  . Tramadol     "Makes me feel like I am falling"  . Penicillins Rash    HOME MEDICATIONS: Current Outpatient Prescriptions  Medication Sig Dispense Refill  . diclofenac (VOLTAREN) 75 MG EC tablet     . gabapentin (NEURONTIN) 100 MG capsule Take 2 capsules (200 mg total) by mouth 3 (three) times daily. 540 capsule 4  . hydroxychloroquine (PLAQUENIL) 200 MG tablet Taking 2032m tab in am and 20079mn pm M-F only; none on Saturday or Sunday. 40 tablet 0  . losartan-hydrochlorothiazide (HYZAAR) 100-25 MG tablet     . NONFORMULARY OR COMPOUNDED ITEM Apply 1-2 application topically every 8 (eight) hours as needed (Diclofenac 3%, Lidocaine 5%, Menthol 1%     (Cream 100 Gm)).    . NUCYNTA 50 MG TABS tablet Take 50 mg by mouth 3 (three) times daily as needed for moderate  pain.     . pantoprazole (PROTONIX) 40 MG tablet Take 40 mg by mouth daily.     . predniSONE (DELTASONE) 1 MG tablet Take 2 tablets (2 mg total) by mouth daily with breakfast. 180 tablet 0   No current facility-administered medications for this visit.     PAST MEDICAL HISTORY: Past Medical History:  Diagnosis Date  . AC (acromioclavicular) joint bone spurs    lt shoulder  . Anemia   . Arthritis   . Carpal tunnel syndrome, bilateral   . Gastroesophageal reflux   . Headache    recent visit to ER @ [facility] for severe headache  . Hypertension   . Lumbar stenosis    Hx of ESIs by Dr. Nelva Bush  .  Polyarthralgia   . Polymyalgia ([place])   . Shingles   . Spinal stenosis     PAST SURGICAL HISTORY: Past Surgical History:  Procedure Laterality Date  . CHOLECYSTECTOMY    . COLONOSCOPY  06/11/2012   Procedure: COLONOSCOPY;  Surgeon: Jamesetta So, MD;  [facility];  Service: Gastroenterology;  Laterality: N/A;  . HYSTEROSCOPY W/D&C N/A 02/25/2014   Procedure: DILATATION AND CURETTAGE /HYSTEROSCOPY;  Surgeon: Florian Buff, MD;  Location: [facility];  Service: Gynecology;  Laterality: N/A;  . POLYPECTOMY N/A 02/25/2014   Procedure: POLYPECTOMY;  Surgeon: Florian Buff, MD;  Location: [facility];  Service: Gynecology;  Laterality: N/A;  . RESECTION DISTAL CLAVICAL Right 03/26/2015   Procedure: OPEN DISTAL CLAVICAL RESECTION ;  Surgeon: Netta Cedars, MD;  Location: [facility];  Service: Orthopedics;  Laterality: Right;    FAMILY HISTORY: Family History  Problem Relation Age of Onset  . Hypertension Mother   . Pneumonia Father     SOCIAL HISTORY:  Social History   Social History  . Marital status: Widowed    Spouse name: N/A  . Number of children: 2  . Years of education: 1 yr coll   Occupational History  . Retired    Social History Main Topics  . Smoking status: Never Smoker  . Smokeless tobacco: Never Used  . Alcohol use No  . Drug use: No  . Sexual activity: Yes    Birth control/ protection: Post-menopausal   Other Topics Concern  . Not on file   Social History Narrative   Lives at home alone.   Right-handed.   1-2 cups caffeine daily.     PHYSICAL EXAM   There were no vitals filed for this visit.  Not recorded      There is no height or weight on file to calculate BMI.  PHYSICAL EXAMNIATION:  Gen: NAD, conversant, well nourised, obese, well groomed                     Cardiovascular: Regular rate rhythm, no peripheral edema, warm, nontender. Eyes: Conjunctivae clear without exudates or hemorrhage Neck: Supple, no carotid bruise. Pulmonary: Clear to  auscultation bilaterally   NEUROLOGICAL EXAM:  MENTAL STATUS: Speech:    Speech is normal; fluent and spontaneous with normal comprehension.  Cognition:     Orientation to time, place and person     Normal recent and remote memory     Normal Attention span and concentration     Normal Language, naming, repeating,spontaneous speech     Fund of knowledge   CRANIAL NERVES: CN II: Visual fields are full to confrontation. Fundoscopic exam is normal with sharp discs and no vascular changes. Pupils are round equal  and briskly reactive to light. CN III, IV, VI: extraocular movement are normal. No ptosis. CN V: Facial sensation is intact to pinprick in all 3 divisions bilaterally. Corneal responses are intact.  CN VII: Face is symmetric with normal eye closure and smile. CN VIII: Hearing is normal to rubbing fingers CN IX, X: Palate elevates symmetrically. Phonation is normal. CN XI: Head turning and shoulder shrug are intact CN XII: Tongue is midline with normal movements and no atrophy.  MOTOR: She has mild bilateral shoulder abduction, external rotation, grip weakness, she has mild bilateral hip flexion, knee flexion extension weakness, left worse than right,  ankle dorsiflexion weakness, left worse than right, left side 2/5, right side 4/5  REFLEXES: Reflexes are 1 and symmetric at the biceps, triceps, knees, and absent at ankles. Plantar responses are flexor.  SENSORY: Length dependent decreased to light touch and vibratory sensation to distal shin level   Coordination: There was no no dysmetria on finger-to-nose and heel-knee-shin.    GAIT/STANCE: Need assistant to get up from seated position, antalgic, with left foot drop, unsteady, rely on her walker   DIAGNOSTIC DATA (LABS, IMAGING, TESTING) - I reviewed patient records, labs, notes, testing and imaging myself where available.  ASSESSMENT AND PLAN  Emryn Flanery is a 71 y.o. female    Severe lumbar stenosis  Evidence of  active bilateral lumbosacral radiculopathy, active denervation at bilateral lumbosacral paraspinal muscles by EMG nerve conduction study in July 2017.  She has profound bilateral lower extremity weakness, left worse than right, due to long history of severe lumbar stenosis,  Per patient she was evaluated by neurosurgeon in the past, tried epidural injection, with limited help of her symptoms   I have discussed with her daughter, will refer her back to neurosurgeon for possible decompression surgery because of severe pain, gait difficulty,  Add on Cymbalta 60 mg daily  Neck pain, bilateral upper extremity paresthesia and weakness . Evidence of cervical spondylitic disease, moderate to severe C6-7 foraminal stenosis.  Polymyalgia rheumatica  With significantly elevated ESR 66, is on tapering dose of prednisone,  Is on tapering dose of prednisone      Marcial Pacas, M.D. Ph.D.  [facility] [address] [place], [place] [address] Ph: [phone] Fax: [phone]  WF:UXNA Hilma Favors, MD

## 2017-08-27 ENCOUNTER — Telehealth: Payer: Self-pay | Admitting: Neurology

## 2017-08-27 NOTE — Telephone Encounter (Addendum)
Spoke to patient - she is taking duloxetine 60mg  once daily.  States symptoms presented after starting the medication.  She has a pending appt with Dr. Trenton Gammon (neurosurgery) on 09/07/17.  She is scheduled to have an epidural steroid injection with Dr. Nelva Bush on 08/30/17.    Per vo by Dr. Krista Blue, she can stop the duloxetine and keep her pending appts for [facility] and neurosurgery evaluation.  She is agreeable to this plan.

## 2017-08-27 NOTE — Telephone Encounter (Signed)
Pt called she has been taking cymbalta for almost 2 weeks and it helps with the pain but causes her teeth to "chatter", feels nervous on the inside/jittery and nauseated. After waking from any amount of sleep she feels jittery. Please call to discuss.

## 2017-08-30 DIAGNOSIS — M4807 Spinal stenosis, lumbosacral region: Secondary | ICD-10-CM | POA: Diagnosis not present

## 2017-08-30 DIAGNOSIS — M545 Low back pain: Secondary | ICD-10-CM | POA: Diagnosis not present

## 2017-09-07 ENCOUNTER — Other Ambulatory Visit: Payer: Self-pay | Admitting: Neurosurgery

## 2017-09-07 DIAGNOSIS — M48062 Spinal stenosis, lumbar region with neurogenic claudication: Secondary | ICD-10-CM

## 2017-09-24 DIAGNOSIS — M25512 Pain in left shoulder: Secondary | ICD-10-CM | POA: Diagnosis not present

## 2017-09-26 ENCOUNTER — Telehealth: Payer: Self-pay

## 2017-09-26 NOTE — Telephone Encounter (Signed)
Patient calling about scheduling appt with Dr.Yan. Pt was told she was referred to [facility] as a urgent request. Pt has appt 10/10/2017 with neurosurgery, and will be having a MRI this month. Pt was told per Dr. Krista Blue note she does not have a time for her to come back as of yet. Pt was told per Dr. Krista Blue note wants [facility] to evaluate her gait, and severe lumbar stenosis. RN recommend she keeps her appt, and have them send the office notes to Dr. Krista Blue. Pt was advised pt to call back after she completes her visit with neurosurgery.

## 2017-09-29 ENCOUNTER — Ambulatory Visit
Admission: RE | Admit: 2017-09-29 | Discharge: 2017-09-29 | Disposition: A | Discharge status: Home / Self Care | Payer: Medicare Other | Source: Ambulatory Visit | Attending: Neurosurgery | Admitting: Neurosurgery

## 2017-09-29 DIAGNOSIS — M48062 Spinal stenosis, lumbar region with neurogenic claudication: Secondary | ICD-10-CM

## 2017-09-29 DIAGNOSIS — M48061 Spinal stenosis, lumbar region without neurogenic claudication: Secondary | ICD-10-CM | POA: Diagnosis not present

## 2017-10-04 ENCOUNTER — Ambulatory Visit: Payer: Medicare Other | Admitting: Nurse Practitioner

## 2017-10-10 DIAGNOSIS — M48062 Spinal stenosis, lumbar region with neurogenic claudication: Secondary | ICD-10-CM | POA: Diagnosis not present

## 2017-11-02 ENCOUNTER — Telehealth: Payer: Self-pay | Admitting: Neurology

## 2017-11-02 ENCOUNTER — Other Ambulatory Visit: Payer: Self-pay | Admitting: Neurology

## 2017-11-02 MED ORDER — GABAPENTIN 100 MG PO CAPS: 200.0000 mg | ORAL_CAPSULE | Freq: Three times a day (TID) | ORAL | 3 refills | Status: DC

## 2017-11-02 NOTE — Telephone Encounter (Signed)
Please call patient, I have recently ordered to local pharmacy, and express script

## 2017-11-02 NOTE — Telephone Encounter (Signed)
Pt has ran out of gabapentin (NEURONTIN) 100 MG capsule and will need refills sent to [facility]. She was advised by [facility] to call her local pharmacy, [facility], request a 30 day supply and to use override # [id number]. This will hold her until the mail order arrives. Pt is needing the medication today please. She has been very apologetic.

## 2017-11-06 DIAGNOSIS — Z79891 Long term (current) use of opiate analgesic: Secondary | ICD-10-CM | POA: Insufficient documentation

## 2017-11-16 DIAGNOSIS — M549 Dorsalgia, unspecified: Secondary | ICD-10-CM | POA: Diagnosis not present

## 2017-11-16 DIAGNOSIS — H66001 Acute suppurative otitis media without spontaneous rupture of ear drum, right ear: Secondary | ICD-10-CM | POA: Diagnosis not present

## 2017-11-25 ENCOUNTER — Other Ambulatory Visit: Payer: Self-pay | Admitting: Rheumatology

## 2017-11-25 DIAGNOSIS — Z79899 Other long term (current) drug therapy: Secondary | ICD-10-CM

## 2017-11-26 ENCOUNTER — Other Ambulatory Visit: Payer: Self-pay

## 2017-11-26 MED ORDER — HYDROXYCHLOROQUINE SULFATE 200 MG PO TABS: ORAL_TABLET | ORAL | 0 refills | Status: DC

## 2017-11-26 NOTE — Progress Notes (Signed)
Last visit: 06/07/2017 Next visit: 01/02/2018 Labs: 06/07/2017 Eye exam: 12/07/2016  Advised patient she is due for labs, patient will be going as soon as she can due to transportation issues.  Patient is scheduled for PLQ eye exam this month. Lab orders have been placed.   Okay to refill 30 day supply per Lovena Le.

## 2017-11-26 NOTE — Telephone Encounter (Signed)
Last visit: 06/07/2017 Next visit: 01/02/2018 Labs: 06/07/2017 Eye exam: 12/07/2016  Advised patient she is due for labs, patient will be going as soon as she can due to transportation issues.  Patient is scheduled for PLQ eye exam this month. Lab orders have been placed.   Okay to refill 30 day supply per Lovena Le.

## 2017-12-07 ENCOUNTER — Ambulatory Visit: Payer: Medicare Other | Admitting: Rheumatology

## 2017-12-13 DIAGNOSIS — Z79899 Other long term (current) drug therapy: Secondary | ICD-10-CM | POA: Diagnosis not present

## 2017-12-13 DIAGNOSIS — H2513 Age-related nuclear cataract, bilateral: Secondary | ICD-10-CM | POA: Diagnosis not present

## 2017-12-13 DIAGNOSIS — M069 Rheumatoid arthritis, unspecified: Secondary | ICD-10-CM | POA: Diagnosis not present

## 2017-12-13 DIAGNOSIS — H524 Presbyopia: Secondary | ICD-10-CM | POA: Diagnosis not present

## 2017-12-13 DIAGNOSIS — H04123 Dry eye syndrome of bilateral lacrimal glands: Secondary | ICD-10-CM | POA: Diagnosis not present

## 2017-12-17 ENCOUNTER — Telehealth: Payer: Self-pay | Admitting: Rheumatology

## 2017-12-17 NOTE — Telephone Encounter (Signed)
Patient advised we would need updated labs and updated PLQ eye exam before we could refill the medication. Patient states she updated her eye exam on 12/13/17 and would have the eye doctor send the results. Patient states she would update labs tomorrow. Patient advised would send prescription once lab results received.

## 2017-12-17 NOTE — Telephone Encounter (Signed)
Patient called requesting a prescription refill of her Plaquenil (90 day supply).  Patient prefers to receive her medication from [facility] because it is less money than [facility] and you can get a 90 day supply rather than a 30 day supply).

## 2017-12-18 ENCOUNTER — Telehealth: Payer: Self-pay | Admitting: Rheumatology

## 2017-12-18 DIAGNOSIS — Z79899 Other long term (current) drug therapy: Secondary | ICD-10-CM | POA: Diagnosis not present

## 2017-12-18 DIAGNOSIS — E559 Vitamin D deficiency, unspecified: Secondary | ICD-10-CM

## 2017-12-18 NOTE — Telephone Encounter (Signed)
PLQ eye exam results received and documented.

## 2017-12-18 NOTE — Telephone Encounter (Signed)
Patient called stating that she called her eye doctor's office Dr. Rutherford Guys who told her that we needed to request the results before they would send them.  Patient also stated that she will be going to [facility] in [place] today to get her labwork done.

## 2017-12-19 ENCOUNTER — Telehealth: Payer: Self-pay | Admitting: Rheumatology

## 2017-12-19 LAB — CMP14+EGFR
ALT: 21 IU/L (ref 0–32)
AST: 22 IU/L (ref 0–40)
Albumin/Globulin Ratio: 1.4 (ref 1.2–2.2)
Albumin: 4.2 g/dL (ref 3.5–4.8)
Alkaline Phosphatase: 146 IU/L — ABNORMAL HIGH (ref 39–117)
BUN/Creatinine Ratio: 11 — ABNORMAL LOW (ref 12–28)
BUN: 16 mg/dL (ref 8–27)
Bilirubin Total: 0.4 mg/dL (ref 0.0–1.2)
CO2: 25 mmol/L (ref 20–29)
Calcium: 9.5 mg/dL (ref 8.7–10.3)
Chloride: 101 mmol/L (ref 96–106)
Creatinine, Ser: 1.47 mg/dL — ABNORMAL HIGH (ref 0.57–1.00)
GFR calc Af Amer: 41 mL/min/{1.73_m2} — ABNORMAL LOW (ref >59)
GFR calc non Af Amer: 36 mL/min/{1.73_m2} — ABNORMAL LOW (ref >59)
Globulin, Total: 3 g/dL (ref 1.5–4.5)
Glucose: 80 mg/dL (ref 65–99)
Potassium: 4.8 mmol/L (ref 3.5–5.2)
Sodium: 141 mmol/L (ref 134–144)
Total Protein: 7.2 g/dL (ref 6.0–8.5)

## 2017-12-19 LAB — CBC WITH DIFFERENTIAL/PLATELET
Basophils Absolute: 0 10*3/uL (ref 0.0–0.2)
Basos: 0 %
EOS (ABSOLUTE): 0.3 10*3/uL (ref 0.0–0.4)
Eos: 4 %
Hematocrit: 35.6 % (ref 34.0–46.6)
Hemoglobin: 12.2 g/dL (ref 11.1–15.9)
Immature Grans (Abs): 0 10*3/uL (ref 0.0–0.1)
Immature Granulocytes: 0 %
Lymphocytes Absolute: 3 10*3/uL (ref 0.7–3.1)
Lymphs: 41 %
MCH: 28.6 pg (ref 26.6–33.0)
MCHC: 34.3 g/dL (ref 31.5–35.7)
MCV: 84 fL (ref 79–97)
Monocytes Absolute: 0.6 10*3/uL (ref 0.1–0.9)
Monocytes: 8 %
Neutrophils Absolute: 3.3 10*3/uL (ref 1.4–7.0)
Neutrophils: 47 %
Platelets: 295 10*3/uL (ref 150–379)
RBC: 4.26 x10E6/uL (ref 3.77–5.28)
RDW: 15.1 % (ref 12.3–15.4)
WBC: 7.3 10*3/uL (ref 3.4–10.8)

## 2017-12-19 MED ORDER — HYDROXYCHLOROQUINE SULFATE 200 MG PO TABS: ORAL_TABLET | ORAL | 0 refills | Status: DC

## 2017-12-19 NOTE — Telephone Encounter (Signed)
Discussed with Dr. Estanislado Pandy.  She can continue on her current dose.  It is ok to refill.  We will continue to monitor her closely.

## 2017-12-19 NOTE — Telephone Encounter (Signed)
Patient called stating that she had her bloodwork done yesterday at [facility].  Patient states that she only has enough Plaquenil to last til next Tuesday, 3/5.

## 2017-12-19 NOTE — Telephone Encounter (Signed)
Last visit: 06/07/2017 Next visit: 01/02/2018 Labs: 12/18/2016 Creat 1.47 previous 1.43, GFR 41 previous 43 Eye exam: 12/13/2017   Okay to refill PLQ?

## 2017-12-20 NOTE — Progress Notes (Deleted)
Office Visit Note  Patient: Nicole [name]             Date of Birth: [birth year]           MRN: [id number]             PCP: Sharilyn Sites, MD Referring: Sharilyn Sites, MD Visit Date: 01/02/2018 Occupation: @GUAROCC @    Subjective:  No chief complaint on file.   History of Present Illness: Nicole [name] is a 72 y.o. female ***   Activities of Daily Living:  Patient reports morning stiffness for *** {minute/hour:19697}.   Patient {ACTIONS;DENIES/REPORTS:21021675::"Denies"} nocturnal pain.  Difficulty dressing/grooming: {ACTIONS;DENIES/REPORTS:21021675::"Denies"} Difficulty climbing stairs: {ACTIONS;DENIES/REPORTS:21021675::"Denies"} Difficulty getting out of chair: {ACTIONS;DENIES/REPORTS:21021675::"Denies"} Difficulty using hands for taps, buttons, cutlery, and/or writing: {ACTIONS;DENIES/REPORTS:21021675::"Denies"}   No Rheumatology ROS completed.   PMFS History:  Patient Active Problem List   Diagnosis Date Noted  . On prednisone therapy 05/31/2017  . Positive anti-CCP test 05/31/2017  . Rheumatoid factor positive 05/31/2017  . Memory loss 04/04/2017  . Polymyalgia rheumatica ([place]) 04/04/2017  . High risk medication use 12/07/2016  . DDD (degenerative disc disease), lumbar 09/27/2016  . HTN (hypertension) 06/05/2016  . Chronic pain syndrome 06/05/2016  . Acute diverticulitis 06/02/2016  . Diverticulitis large intestine 06/02/2016  . Spinal stenosis of lumbar region 02/29/2016  . Joint pain 02/29/2016  . Endometrial polyp 02/06/2014  . Postmenopausal bleeding 01/30/2014  . GERD (gastroesophageal reflux disease) 09/29/2013  . Spinal stenosis, thoracic 09/29/2013  . Shingles 09/29/2013  . Thoracic or lumbosacral neuritis or radiculitis, unspecified 05/04/2011  . Abnormality of gait 05/04/2011  . Muscle weakness (generalized) 05/04/2011  . Bilateral primary osteoarthritis of knee 04/07/2009  . KNEE PAIN 04/07/2009    Past Medical History:  Diagnosis Date  . AC  (acromioclavicular) joint bone spurs    lt shoulder  . Anemia   . Arthritis   . Carpal tunnel syndrome, bilateral   . Gastroesophageal reflux   . Headache    recent visit to ER @ [facility] for severe headache  . Hypertension   . Lumbar stenosis    Hx of ESIs by Dr. Nelva Bush  . Polyarthralgia   . Polymyalgia ([place])   . Shingles   . Spinal stenosis     Family History  Problem Relation Age of Onset  . Hypertension Mother   . Pneumonia Father    Past Surgical History:  Procedure Laterality Date  . CHOLECYSTECTOMY    . COLONOSCOPY  06/11/2012   Procedure: COLONOSCOPY;  Surgeon: Jamesetta So, MD;  [facility];  Service: Gastroenterology;  Laterality: N/A;  . HYSTEROSCOPY W/D&C N/A 02/25/2014   Procedure: DILATATION AND CURETTAGE /HYSTEROSCOPY;  Surgeon: Florian Buff, MD;  Location[facility];  Service: Gynecology;  Laterality: N/A;  . POLYPECTOMY N/A 02/25/2014   Procedure: POLYPECTOMY;  Surgeon: Florian Buff, MD;  Location: [facility];  Service: Gynecology;  Laterality: N/A;  . RESECTION DISTAL CLAVICAL Right 03/26/2015   Procedure: OPEN DISTAL CLAVICAL RESECTION ;  Surgeon: Netta Cedars, MD;  Location: [facility];  Service: Orthopedics;  Laterality: Right;   Social History   Social History Narrative   Lives at home alone.   Right-handed.   1-2 cups caffeine daily.     Objective: Vital Signs: There were no vitals taken for this visit.   Physical Exam   Musculoskeletal Exam: ***  CDAI Exam: No CDAI exam completed.    Investigation: No additional findings. CBC Latest Ref Rng & Units 12/18/2017 06/07/2017 12/29/2016  WBC 3.4 - 10.8 x10E3/uL 7.3 8.1 9.0  Hemoglobin 11.1 - 15.9 g/dL 12.2 12.2 11.7  Hematocrit 34.0 - 46.6 % 35.6 37.0 35.5  Platelets 150 - 379 x10E3/uL 295 338 303   CMP Latest Ref Rng & Units 12/18/2017 06/07/2017 12/29/2016  Glucose 65 - 99 mg/dL 80 84 77  BUN 8 - 27 mg/dL 16 21 20   Creatinine 0.57 - 1.00 mg/dL 1.47(H) 1.43(H) 1.34(H)  Sodium 134 - 144 mmol/L  141 139 143  Potassium 3.5 - 5.2 mmol/L 4.8 4.5 4.3  Chloride 96 - 106 mmol/L 101 103 107  CO2 20 - 29 mmol/L 25 24 27   Calcium 8.7 - 10.3 mg/dL 9.5 9.3 9.0  Total Protein 6.0 - 8.5 g/dL 7.2 6.5 6.4  Total Bilirubin 0.0 - 1.2 mg/dL 0.4 0.3 0.4  Alkaline Phos 39 - 117 IU/L 146(H) 125 121  AST 0 - 40 IU/L 22 16 16   ALT 0 - 32 IU/L 21 18 16     Imaging: No results found.  Speciality Comments: PLQ Eye Exam: 12/13/17 [facility] @ [facility]    Procedures:  No procedures performed Allergies: Oxycontin [oxycodone hcl]; Sulfa antibiotics; Tramadol; and Penicillins   Assessment / Plan:     Visit Diagnoses: No diagnosis found.    Orders: No orders of the defined types were placed in this encounter.  No orders of the defined types were placed in this encounter.   Face-to-face time spent with patient was *** minutes. 50% of time was spent in counseling and coordination of care.  Follow-Up Instructions: No Follow-up on file.   Earnestine Mealing, CMA  Note - This record has been created using Editor, commissioning.  Chart creation errors have been sought, but may not always  have been located. Such creation errors do not reflect on  the standard of medical care.

## 2018-01-02 ENCOUNTER — Ambulatory Visit: Payer: Medicare Other | Admitting: Rheumatology

## 2018-01-04 NOTE — Progress Notes (Addendum)
Office Visit Note  Patient: Nicole [name]             Date of Birth: [birth year]           MRN: [id number]             PCP: Sharilyn Sites, MD Referring: Sharilyn Sites, MD Visit Date: 01/09/2018 Occupation: _0 @    Subjective:  Pain in multiple joints   History of Present Illness: Nicole [name] is a 72 y.o. female with history of polymyalgia rheumatica and osteoarthritis.  Patient states that she has been off prednisone since November 2018.  She continues to take Plaquenil 200 mg twice daily Monday through Friday.  She denies needing any refills.  Patient states that over the past 2 months she has been having increased pain in bilateral shoulders bilateral knees and her right hip.  She is also having pain in her right ankle.  She states the pain is most severe in her left shoulder.  She states she is also been having increased weakness and worsening fatigue.  She reports her legs feel "heavy."  Patient states that she is having some burning and numbness in her lower legs.  Patient says that she has been having to walk with a walker.  She continues to have lower back pain due to lumbar stenosis.  She sees Dr. Trenton Gammon.  She states that she is considering having surgery soon. She denies any recent headaches or changes in vision.    Activities of Daily Living:  Patient reports morning stiffness for 1-2 hours.   Patient Denies nocturnal pain.  Difficulty dressing/grooming: Denies Difficulty climbing stairs: Reports Difficulty getting out of chair: Reports Difficulty using hands for taps, buttons, cutlery, and/or writing: Reports   Review of Systems  Constitutional: Positive for fatigue. Negative for weakness.  HENT: Positive for mouth dryness. Negative for mouth sores, trouble swallowing, trouble swallowing and nose dryness.   Eyes: Negative for pain, redness, visual disturbance and dryness.  Respiratory: Negative for cough, hemoptysis, shortness of breath and difficulty breathing.     Cardiovascular: Positive for swelling in legs/feet. Negative for chest pain, palpitations, hypertension and irregular heartbeat.  Gastrointestinal: Negative for blood in stool, constipation and diarrhea.  Endocrine: Negative for increased urination.  Genitourinary: Negative for painful urination.  Musculoskeletal: Positive for arthralgias, gait problem, joint pain, myalgias, muscle weakness, morning stiffness and myalgias. Negative for joint swelling and muscle tenderness.  Skin: Negative for color change, rash, hair loss, nodules/bumps, redness, skin tightness, ulcers and sensitivity to sunlight.  Allergic/Immunologic: Negative for susceptible to infections.  Neurological: Negative for dizziness, numbness and headaches.  Hematological: Negative for swollen glands.  Psychiatric/Behavioral: Negative for depressed mood and sleep disturbance. The patient is not nervous/anxious.     PMFS History:  Patient Active Problem List   Diagnosis Date Noted  . On prednisone therapy 05/31/2017  . Positive anti-CCP test 05/31/2017  . Rheumatoid factor positive 05/31/2017  . Memory loss 04/04/2017  . Polymyalgia rheumatica ([place]) 04/04/2017  . High risk medication use 12/07/2016  . DDD (degenerative disc disease), lumbar 09/27/2016  . HTN (hypertension) 06/05/2016  . Chronic pain syndrome 06/05/2016  . Acute diverticulitis 06/02/2016  . Diverticulitis large intestine 06/02/2016  . Spinal stenosis of lumbar region 02/29/2016  . Joint pain 02/29/2016  . Endometrial polyp 02/06/2014  . Postmenopausal bleeding 01/30/2014  . GERD (gastroesophageal reflux disease) 09/29/2013  . Spinal stenosis, thoracic 09/29/2013  . Shingles 09/29/2013  . Thoracic or lumbosacral neuritis or radiculitis, unspecified 05/04/2011  .  Abnormality of gait 05/04/2011  . Muscle weakness (generalized) 05/04/2011  . Bilateral primary osteoarthritis of knee 04/07/2009  . KNEE PAIN 04/07/2009    Past Medical History:   Diagnosis Date  . AC (acromioclavicular) joint bone spurs    lt shoulder  . Anemia   . Arthritis   . Carpal tunnel syndrome, bilateral   . Gastroesophageal reflux   . Headache    recent visit to ER @ [facility] for severe headache  . Hypertension   . Lumbar stenosis    Hx of ESIs by Dr. Nelva Bush  . Polyarthralgia   . Polymyalgia ([place])   . Shingles   . Spinal stenosis     Family History  Problem Relation Age of Onset  . Hypertension Mother   . Pneumonia Father   . Arthritis Sister    Past Surgical History:  Procedure Laterality Date  . CHOLECYSTECTOMY    . COLONOSCOPY  06/11/2012   Procedure: COLONOSCOPY;  Surgeon: Jamesetta So, MD;  [facility];  Service: Gastroenterology;  Laterality: N/A;  . HYSTEROSCOPY W/D&C N/A 02/25/2014   Procedure: DILATATION AND CURETTAGE /HYSTEROSCOPY;  Surgeon: Florian Buff, MD;  Location: [facility];  Service: Gynecology;  Laterality: N/A;  . POLYPECTOMY N/A 02/25/2014   Procedure: POLYPECTOMY;  Surgeon: Florian Buff, MD;  Location: [facility];  Service: Gynecology;  Laterality: N/A;  . RESECTION DISTAL CLAVICAL Right 03/26/2015   Procedure: OPEN DISTAL CLAVICAL RESECTION ;  Surgeon: Netta Cedars, MD;  Location: [place];  Service: Orthopedics;  Laterality: Right;   Social History   Social History Narrative   Lives at home alone.   Right-handed.   1-2 cups caffeine daily.     Objective: Vital Signs: BP 123/80 (BP Location: Left Arm, Patient Position: Sitting, Cuff Size: Normal)   Pulse 89   Resp (!) 21   Ht 5' (1.524 m)   Wt 270 lb (122.5 kg) Comment: per patient, unable to weigh  BMI 52.73 kg/m    Physical Exam  Constitutional: She is oriented to person, place, and time. She appears well-developed and well-nourished.  HENT:  Head: Normocephalic and atraumatic.  Tenderness to palpation of right temple  Eyes: Conjunctivae and EOM are normal.  Neck: Normal range of motion.  Cardiovascular: Normal rate, regular rhythm, normal heart  sounds and intact distal pulses.  Pulmonary/Chest: Effort normal and breath sounds normal.  Abdominal: Soft. Bowel sounds are normal.  Lymphadenopathy:    She has no cervical adenopathy.  Neurological: She is alert and oriented to person, place, and time.  Skin: Skin is warm and dry. Capillary refill takes less than 2 seconds.  Psychiatric: She has a normal mood and affect. Her behavior is normal.  Nursing note and vitals reviewed.    Musculoskeletal Exam: C-spine very limited range of motion.  Limited range of motion of thoracic and lumbar spine.  Active range of motion of bilateral shoulders very limited.  Right shoulder abduction to 80 degrees.  Left shoulder abduction to 70 degrees.  Elbow joints, wrist joints, MCPs, PIPs, DIPs good range of motion with no synovitis.  She is edema of bilateral hands.  No synovitis was noted.  She has weakness of bilateral upper and lower extremities.  Peripheral edema noted.  CDAI Exam: No CDAI exam completed.    Investigation: No additional findings. CBC Latest Ref Rng & Units 12/18/2017 06/07/2017 12/29/2016  WBC 3.4 - 10.8 x10E3/uL 7.3 8.1 9.0  Hemoglobin 11.1 - 15.9 g/dL 12.2 12.2 11.7  Hematocrit 34.0 - 46.6 % 35.6 37.0 35.5  Platelets 150 - 379 x10E3/uL 295 338 303   CMP Latest Ref Rng & Units 12/18/2017 06/07/2017 12/29/2016  Glucose 65 - 99 mg/dL 80 84 77  BUN 8 - 27 mg/dL _0 Creatinine 0.57 - 1.00 mg/dL 1.47(H) 1.43(H) 1.34(H)  Sodium 134 - 144 mmol/L 141 139 143  Potassium 3.5 - 5.2 mmol/L 4.8 4.5 4.3  Chloride 96 - 106 mmol/L 101 103 107  CO2 20 - 29 mmol/L _1 Calcium 8.7 - 10.3 mg/dL 9.5 9.3 9.0  Total Protein 6.0 - 8.5 g/dL 7.2 6.5 6.4  Total Bilirubin 0.0 - 1.2 mg/dL 0.4 0.3 0.4  Alkaline Phos 39 - 117 IU/L 146(H) 125 121  AST 0 - 40 IU/L _2 ALT 0 - 32 IU/L _3 Imaging: No results found.  Speciality Comments: PLQ Eye Exam: 12/13/17 WNl @ [place]    Procedures:  No procedures  performed Allergies: Oxycontin [oxycodone hcl]; Sulfa antibiotics; Tramadol; Penicillins; and Sulfasalazine   Assessment / Plan:     Visit Diagnoses: Polymyalgia rheumatica ([place]): Patient has been off prednisone since November 2018.  She continues to take Plaquenil 200 mg twice daily Monday through Friday.  She states over the past 2 months she has been having increased pain in her bilateral shoulders and bilateral hips.  Her pain is most severe in her left shoulder and right hip.  She has very limited abduction of bilateral shoulders. She has upper and lower extremity weakness. She has been having increased morning stiffness over the past 2 months.  She also has some tenderness of the right temple upon palpation.  We are going to check a sed rate, CK, TSH, vitamin D today.  ESR is 53 today.  Since she is having a flare of PMR and possible temporal arteritis, we will start her on Prednisone 20 mg BID by mouth for 2 weeks.  Although she does not have a known history of diabetes, she will return to the office on Monday and we will check her blood glucose level.  We will recheck her ESR in 2 weeks.  If it is decreasing we will begin tapering her prednisone by 5 mg every 2 weeks. Patient was advised to follow up with ophthalmologist to assess vision. She is not having blurry vision or headaches at this time. She will also need a referral to a general surgeon for a right temporal artery biopsy.  She declined the biopsy at this time.  She is going to think about getting the biopsy. We will discuss this further at her follow up visit on Monday. If she agrees we will refer her to [facility] surgery.  She was advised to follow up with PCP for management of her blood glucose levels as well due to putting her on high dose prednisone. A prescription for prednisone was sent to the pharmacy today.  She was advised to take her first dose of prednisone tonight.   High risk medication use - PLQ 200 mg BID M-F.  She does  not need any refills at this time. PLQ Eye Exam: 12/13/17 [facility] @ [facility].  CBC and CMP were checked today.    On prednisone therapy - She has been off of Prednisone since November 2018.   Positive anti-CCP test  Rheumatoid factor positive  Chronic pain syndrome: She has pain in multiple joints.   Spinal stenosis of  lumbar region, unspecified whether neurogenic claudication present: She is followed by Dr. Trenton Gammon.  She continues to have significant lower back pain and radiation of pain down bilateral legs.  She states she has been having some numbness and burning in her bilateral legs.  She is going to follow-up with Dr. Trenton Gammon soon and is planning on scheduling surgery  Bilateral primary osteoarthritis of knee: She is having discomfort in bilateral knees.  She is some warmth in her left knee.  She is unable to fully extend her left knee.  Right knee good range of motion.  She is walking with a walker for stability.  Other medical conditions are listed as follows:  History of gastroesophageal reflux (GERD)  History of hypertension  History of diverticulitis    Orders: No orders of the defined types were placed in this encounter.  No orders of the defined types were placed in this encounter.   Face-to-face time spent with patient was 30 minutes. >50% of time was spent in counseling and coordination of care.  Follow-Up Instructions: Return for Polymyalgia Rheumatica, Osteoarthritis.   Ofilia Neas, PA-C   I examined and evaluated the patient with Hazel Sams PA. The plan of care was discussed as noted above.  Bo Merino, MD  Note - This record has been created using Editor, commissioning.  Chart creation errors have been sought, but may not always  have been located. Such creation errors do not reflect on  the standard of medical care.

## 2018-01-09 ENCOUNTER — Ambulatory Visit (INDEPENDENT_AMBULATORY_CARE_PROVIDER_SITE_OTHER): Payer: Medicare Other | Admitting: Physician Assistant

## 2018-01-09 ENCOUNTER — Telehealth: Payer: Self-pay | Admitting: Rheumatology

## 2018-01-09 ENCOUNTER — Encounter: Payer: Self-pay | Admitting: Physician Assistant

## 2018-01-09 VITALS — BP 123/80 | HR 89 | Resp 21 | Ht 60.0 in | Wt 270.0 lb

## 2018-01-09 DIAGNOSIS — G894 Chronic pain syndrome: Secondary | ICD-10-CM

## 2018-01-09 DIAGNOSIS — M48061 Spinal stenosis, lumbar region without neurogenic claudication: Secondary | ICD-10-CM

## 2018-01-09 DIAGNOSIS — Z8719 Personal history of other diseases of the digestive system: Secondary | ICD-10-CM | POA: Diagnosis not present

## 2018-01-09 DIAGNOSIS — M353 Polymyalgia rheumatica: Secondary | ICD-10-CM

## 2018-01-09 DIAGNOSIS — R768 Other specified abnormal immunological findings in serum: Secondary | ICD-10-CM

## 2018-01-09 DIAGNOSIS — M170 Bilateral primary osteoarthritis of knee: Secondary | ICD-10-CM

## 2018-01-09 DIAGNOSIS — Z7952 Long term (current) use of systemic steroids: Secondary | ICD-10-CM

## 2018-01-09 DIAGNOSIS — Z8679 Personal history of other diseases of the circulatory system: Secondary | ICD-10-CM | POA: Diagnosis not present

## 2018-01-09 DIAGNOSIS — Z79899 Other long term (current) drug therapy: Secondary | ICD-10-CM

## 2018-01-09 LAB — SEDIMENTATION RATE: Sed Rate: 53 mm/h — ABNORMAL HIGH (ref 0–30)

## 2018-01-09 MED ORDER — PREDNISONE 10 MG PO TABS
ORAL_TABLET | ORAL | 0 refills | Status: DC
Start: 2018-01-09 — End: 2018-02-04

## 2018-01-09 NOTE — Addendum Note (Signed)
Addended by: Ofilia Neas on: 01/09/2018 05:15 PM   Modules accepted: Orders

## 2018-01-09 NOTE — Telephone Encounter (Signed)
Diane called from [employer] to give the following information: Sed rate high at 53.   If you have any questions, please call [phone]

## 2018-01-10 ENCOUNTER — Telehealth: Payer: Self-pay | Admitting: Rheumatology

## 2018-01-10 LAB — CBC WITH DIFFERENTIAL/PLATELET
Basophils Absolute: 49 cells/uL (ref 0–200)
Basophils Relative: 0.8 %
Eosinophils Absolute: 299 cells/uL (ref 15–500)
Eosinophils Relative: 4.9 %
HCT: 33.9 % — ABNORMAL LOW (ref 35.0–45.0)
Hemoglobin: 11.4 g/dL — ABNORMAL LOW (ref 11.7–15.5)
Lymphs Abs: 2031 cells/uL (ref 850–3900)
MCH: 28.6 pg (ref 27.0–33.0)
MCHC: 33.6 g/dL (ref 32.0–36.0)
MCV: 85 fL (ref 80.0–100.0)
MPV: 10.4 fL (ref 7.5–12.5)
Monocytes Relative: 6.9 %
Neutro Abs: 3300 cells/uL (ref 1500–7800)
Neutrophils Relative %: 54.1 %
Platelets: 290 10*3/uL (ref 140–400)
RBC: 3.99 10*6/uL (ref 3.80–5.10)
RDW: 13.6 % (ref 11.0–15.0)
Total Lymphocyte: 33.3 %
WBC mixed population: 421 cells/uL (ref 200–950)
WBC: 6.1 10*3/uL (ref 3.8–10.8)

## 2018-01-10 LAB — COMPLETE METABOLIC PANEL WITH GFR
AG Ratio: 1.3 (calc) (ref 1.0–2.5)
ALT: 15 U/L (ref 6–29)
AST: 17 U/L (ref 10–35)
Albumin: 3.6 g/dL (ref 3.6–5.1)
Alkaline phosphatase (APISO): 127 U/L (ref 33–130)
BUN/Creatinine Ratio: 15 (calc) (ref 6–22)
BUN: 22 mg/dL (ref 7–25)
CO2: 29 mmol/L (ref 20–32)
Calcium: 9.1 mg/dL (ref 8.6–10.4)
Chloride: 106 mmol/L (ref 98–110)
Creat: 1.51 mg/dL — ABNORMAL HIGH (ref 0.60–0.93)
GFR, Est African American: 40 mL/min/{1.73_m2} — ABNORMAL LOW (ref >=60)
GFR, Est Non African American: 34 mL/min/{1.73_m2} — ABNORMAL LOW (ref >=60)
Globulin: 2.8 g/dL (calc) (ref 1.9–3.7)
Glucose, Bld: 91 mg/dL (ref 65–99)
Potassium: 4.5 mmol/L (ref 3.5–5.3)
Sodium: 142 mmol/L (ref 135–146)
Total Bilirubin: 0.4 mg/dL (ref 0.2–1.2)
Total Protein: 6.4 g/dL (ref 6.1–8.1)

## 2018-01-10 LAB — TSH: TSH: 3.77 mIU/L (ref 0.40–4.50)

## 2018-01-10 LAB — VITAMIN D 25 HYDROXY (VIT D DEFICIENCY, FRACTURES): Vit D, 25-Hydroxy: 25 ng/mL — ABNORMAL LOW (ref 30–100)

## 2018-01-10 LAB — CK: Total CK: 178 U/L — ABNORMAL HIGH (ref 29–143)

## 2018-01-10 NOTE — Progress Notes (Signed)
Office Visit Note  Patient: Nicole [name]             Date of Birth: [birth year]           MRN: [id number]             PCP: Nicole Sites, MD Referring: Nicole Sites, MD Visit Date: 01/14/2018 Occupation: _0 @    Subjective:  Improvement in muscle weakness.   History of Present Illness: Nicole [name] is a 72 y.o. female with history of polymyalgia rheumatica and most likely temporal arteritis.  She states that she is feeling much better on prednisone.  She was started on prednisone 20 mg twice daily after her last visit.  She was having increased muscle pain and weakness over the last 1 month to the point she was having difficulty with her routine activities.  She states that after taking prednisone she started noticing improvement in her muscle weakness and tenderness.  The generalized pain improved.  She had a right temporal artery tenderness on examination but no history of headaches.  She still does not have any headaches.  Lower back pain comes and goes.  He is to have some discomfort in her knee joints.  Her son Nicole [name] was on the phone during the conversation and I discussed the whole plan with him.  Activities of Daily Living:  Patient reports morning stiffness for 1 hour.   Patient Reports nocturnal pain.  Difficulty dressing/grooming: Reports Difficulty climbing stairs: Reports Difficulty getting out of chair: Reports Difficulty using hands for taps, buttons, cutlery, and/or writing: Denies   Review of Systems  Constitutional: Positive for fatigue. Negative for night sweats, weight gain and weight loss.  HENT: Positive for mouth dryness. Negative for mouth sores, trouble swallowing, trouble swallowing and nose dryness.   Eyes: Negative for pain, redness, visual disturbance and dryness.  Respiratory: Negative for cough, shortness of breath and difficulty breathing.   Cardiovascular: Negative for chest pain, palpitations, hypertension, irregular heartbeat and swelling in  legs/feet.  Gastrointestinal: Negative for blood in stool, constipation and diarrhea.  Endocrine: Negative for increased urination.  Genitourinary: Negative for vaginal dryness.  Musculoskeletal: Positive for arthralgias, joint pain, joint swelling, myalgias, morning stiffness and myalgias. Negative for muscle weakness and muscle tenderness.  Skin: Negative for color change, rash, hair loss, skin tightness, ulcers and sensitivity to sunlight.  Allergic/Immunologic: Negative for susceptible to infections.  Neurological: Negative for dizziness, memory loss, night sweats and weakness.  Hematological: Negative for swollen glands.  Psychiatric/Behavioral: Negative for depressed mood and sleep disturbance. The patient is not nervous/anxious.     PMFS History:  Patient Active Problem List   Diagnosis Date Noted  . On prednisone therapy 05/31/2017  . Positive anti-CCP test 05/31/2017  . Rheumatoid factor positive 05/31/2017  . Memory loss 04/04/2017  . Polymyalgia rheumatica ([place]) 04/04/2017  . High risk medication use 12/07/2016  . DDD (degenerative disc disease), lumbar 09/27/2016  . HTN (hypertension) 06/05/2016  . Chronic pain syndrome 06/05/2016  . Acute diverticulitis 06/02/2016  . Diverticulitis large intestine 06/02/2016  . Spinal stenosis of lumbar region 02/29/2016  . Joint pain 02/29/2016  . Endometrial polyp 02/06/2014  . Postmenopausal bleeding 01/30/2014  . GERD (gastroesophageal reflux disease) 09/29/2013  . Spinal stenosis, thoracic 09/29/2013  . Shingles 09/29/2013  . Thoracic or lumbosacral neuritis or radiculitis, unspecified 05/04/2011  . Abnormality of gait 05/04/2011  . Muscle weakness (generalized) 05/04/2011  . Bilateral primary osteoarthritis of knee 04/07/2009  . KNEE PAIN 04/07/2009    Past  Medical History:  Diagnosis Date  . AC (acromioclavicular) joint bone spurs    lt shoulder  . Anemia   . Arthritis   . Carpal tunnel syndrome, bilateral   .  Gastroesophageal reflux   . Headache    recent visit to ER @ [facility] for severe headache  . Hypertension   . Lumbar stenosis    Hx of ESIs by Dr. Nelva Bush  . Polyarthralgia   . Polymyalgia ([place])   . Shingles   . Spinal stenosis     Family History  Problem Relation Age of Onset  . Hypertension Mother   . Pneumonia Father   . Arthritis Sister    Past Surgical History:  Procedure Laterality Date  . CHOLECYSTECTOMY    . COLONOSCOPY  06/11/2012   Procedure: COLONOSCOPY;  Surgeon: Jamesetta So, MD;  [facility];  Service: Gastroenterology;  Laterality: N/A;  . HYSTEROSCOPY W/D&C N/A 02/25/2014   Procedure: DILATATION AND CURETTAGE /HYSTEROSCOPY;  Surgeon: Florian Buff, MD;  Location: [facility];  Service: Gynecology;  Laterality: N/A;  . POLYPECTOMY N/A 02/25/2014   Procedure: POLYPECTOMY;  Surgeon: Florian Buff, MD;  Location: [facility];  Service: Gynecology;  Laterality: N/A;  . RESECTION DISTAL CLAVICAL Right 03/26/2015   Procedure: OPEN DISTAL CLAVICAL RESECTION ;  Surgeon: Netta Cedars, MD;  Location: [place];  Service: Orthopedics;  Laterality: Right;   Social History   Social History Narrative   Lives at home alone.   Right-handed.   1-2 cups caffeine daily.     Objective: Vital Signs: BP (!) 159/84 (BP Location: Left Arm, Patient Position: Sitting, Cuff Size: Normal)   Pulse 71   Resp 18   Ht 5' (1.524 m)   Wt 270 lb (122.5 kg) Comment: patient unable to weigh  BMI 52.73 kg/m    Physical Exam  Constitutional: She is oriented to person, place, and time. She appears well-developed and well-nourished.  Patient is in wheelchair.  She states she uses walker at home.  She is able to drive.  HENT:  Head: Normocephalic and atraumatic.  Tenderness on palpation of her right temporal artery  Eyes: Conjunctivae and EOM are normal.  Neck: Normal range of motion.  Cardiovascular: Normal rate, regular rhythm, normal heart sounds and intact distal pulses.  Pulmonary/Chest:  Effort normal and breath sounds normal.  Abdominal: Soft. Bowel sounds are normal.  Musculoskeletal:  She had mild muscle tenderness in the left upper arm.  Lymphadenopathy:    She has no cervical adenopathy.  Neurological: She is alert and oriented to person, place, and time.  Left lower extremity weakness  Skin: Skin is warm and dry. Capillary refill takes less than 2 seconds.  Psychiatric: She has a normal mood and affect. Her behavior is normal.  Nursing note and vitals reviewed.    Musculoskeletal Exam: C-spine thoracic and lumbar spine limited range of motion with discomfort.  She has difficulty lifting her left lower extremity.  She was able to raise her arms up to 90 degrees today with less discomfort.  She has discomfort with movement of her lower extremities due to osteoarthritis in her knee joints and disc disease of lumbar spine.  No joint swelling or synovitis was noted.  CDAI Exam: No CDAI exam completed.    Investigation: No additional findings. CBC Latest Ref Rng & Units 01/09/2018 12/18/2017 06/07/2017  WBC 3.8 - 10.8 Thousand/uL 6.1 7.3 8.1  Hemoglobin 11.7 - 15.5 g/dL 11.4(L) 12.2 12.2  Hematocrit 35.0 -  45.0 % 33.9(L) 35.6 37.0  Platelets 140 - 400 Thousand/uL 290 295 338   CMP Latest Ref Rng & Units 01/09/2018 12/18/2017 06/07/2017  Glucose 65 - 99 mg/dL 91 80 84  BUN 7 - 25 mg/dL _0 Creatinine 0.60 - 0.93 mg/dL 1.51(H) 1.47(H) 1.43(H)  Sodium 135 - 146 mmol/L 142 141 139  Potassium 3.5 - 5.3 mmol/L 4.5 4.8 4.5  Chloride 98 - 110 mmol/L 106 101 103  CO2 20 - 32 mmol/L _1 Calcium 8.6 - 10.4 mg/dL 9.1 9.5 9.3  Total Protein 6.1 - 8.1 g/dL 6.4 7.2 6.5  Total Bilirubin 0.2 - 1.2 mg/dL 0.4 0.4 0.3  Alkaline Phos 39 - 117 IU/L - 146(H) 125  AST 10 - 35 U/L _2 ALT 6 - 29 U/L _3 Imaging: No results found.  Speciality Comments: PLQ Eye Exam: 12/13/17 WNl @ [place]    Procedures:  No procedures performed Allergies:  Oxycontin [oxycodone hcl]; Sulfa antibiotics; Tramadol; Penicillins; and Sulfasalazine   Assessment / Plan:     Visit Diagnoses: Polymyalgia rheumatica ([place]) - ESR 53 - Plan: She has noticed improvement in her muscle tenderness and muscle strength since she started prednisone.  She is on prednisone 40 mg a day currently divided into doses.  The higher dose of prednisone was given because of the suspicion of temporal arteritis.  The side effects of prednisone were discussed at length.  She will have to schedule an appointment with her PCP for monitoring of blood sugar and blood pressure.  I will repeat her sedimentation rate today.  Temporal arteritis ([facility]) - Right.  Her temporal artery tenderness persists although it is less intense compared to last visit.  She denies any headaches.  She had an eye exam with ophthalmologist a month ago.  I have advised her to get another eye exam.  We will try to schedule the appointment with ophthalmologist..  I will also schedule right temporal artery biopsy if she is in agreement.  High risk medication use -she is on prednisone 20 mg twice daily currently and Plaquenil 200 mg twice daily.  I would like to taper her prednisone after a month.  Plan: BASIC METABOLIC PANEL WITH GFR, Serum protein electrophoresis with reflex  On prednisone therapy - 20 mg BID.  She will need close monitoring of her blood sugar and blood pressure.  She is also at high risk of developing osteoporosis.  She is advised to take calcium and vitamin D on a regular basis.  She had a recent bone density we will request the report.  We contacted her PCPs office for monitoring of blood glucose and blood pressure.  Positive anti-CCP test: She had no synovitis on examination.  Rheumatoid factor positive.  Chronic pain syndrome: She has chronic pain and discomfort from underlying osteoarthritis and disc disease.  Spinal stenosis, thoracic  Bilateral primary osteoarthritis of knee: Chronic pain  she uses a walker.  DDD (degenerative disc disease), lumbar - With a spinal stenosis.  She has residual left lower extremity weakness.  Memory loss  History of diverticulitis  History of gastroesophageal reflux (GERD)  History of hypertension: Her blood pressure was elevated today I have  Advised her to schedule an appointment with her PCP.  Elevated CK -her CK was mildly elevated.  Plan: CK    Orders: Orders Placed This Encounter  Procedures  . BASIC METABOLIC PANEL WITH GFR  . CK  .  Sedimentation rate  . Serum protein electrophoresis with reflex   No orders of the defined types were placed in this encounter.   Face-to-face time spent with patient was 60 minutes.  Greater than 50% of time was spent in counseling and coordination of care.  Follow-Up Instructions: Return in about 1 month (around 02/11/2018) for PMR.   Bo Merino, MD  Note - This record has been created using Editor, commissioning.  Chart creation errors have been sought, but may not always  have been located. Such creation errors do not reflect on  the standard of medical care.

## 2018-01-10 NOTE — Telephone Encounter (Signed)
Patient called stating that Dr. Estanislado Pandy called yesterday to explain her blood test results but she is still confused about what they mean and the next step to take.  Patient is requesting a return call to go over the results again.  Patient states that her grandchildren were in the background making noise and made it difficult to hear what Dr. Estanislado Pandy was saying.

## 2018-01-10 NOTE — Progress Notes (Signed)
CK is elevated.  We will recheck CK in 2 weeks when we recheck ESR.   Vitamin D is low. Please send in vitamin D 50,000 units by mouth once a week x3 months.  We will recheck vitamin D in 3 months.   Hgb is low.  We will continue to monitor.  Creatinine continues to trend up.  We will continue to monitor we next labs.  We will avoid starting MTX.

## 2018-01-11 ENCOUNTER — Telehealth: Payer: Self-pay | Admitting: Rheumatology

## 2018-01-11 NOTE — Telephone Encounter (Signed)
See previous phone note.  

## 2018-01-11 NOTE — Telephone Encounter (Signed)
Reviewed lab results with patient and reminded patient of her appointment on 01/14/18.

## 2018-01-11 NOTE — Telephone Encounter (Signed)
Patient called requesting a return call today to discuss Dr. Arlean Hopping findings at her last office visit.

## 2018-01-14 ENCOUNTER — Encounter: Payer: Self-pay | Admitting: Physician Assistant

## 2018-01-14 ENCOUNTER — Ambulatory Visit (INDEPENDENT_AMBULATORY_CARE_PROVIDER_SITE_OTHER): Payer: Medicare Other | Admitting: Physician Assistant

## 2018-01-14 VITALS — BP 159/84 | HR 71 | Resp 18 | Ht 60.0 in | Wt 270.0 lb

## 2018-01-14 DIAGNOSIS — R768 Other specified abnormal immunological findings in serum: Secondary | ICD-10-CM | POA: Diagnosis not present

## 2018-01-14 DIAGNOSIS — M5136 Other intervertebral disc degeneration, lumbar region: Secondary | ICD-10-CM

## 2018-01-14 DIAGNOSIS — R748 Abnormal levels of other serum enzymes: Secondary | ICD-10-CM

## 2018-01-14 DIAGNOSIS — Z8679 Personal history of other diseases of the circulatory system: Secondary | ICD-10-CM | POA: Diagnosis not present

## 2018-01-14 DIAGNOSIS — G894 Chronic pain syndrome: Secondary | ICD-10-CM

## 2018-01-14 DIAGNOSIS — M170 Bilateral primary osteoarthritis of knee: Secondary | ICD-10-CM

## 2018-01-14 DIAGNOSIS — M353 Polymyalgia rheumatica: Secondary | ICD-10-CM

## 2018-01-14 DIAGNOSIS — Z79899 Other long term (current) drug therapy: Secondary | ICD-10-CM

## 2018-01-14 DIAGNOSIS — M316 Other giant cell arteritis: Secondary | ICD-10-CM

## 2018-01-14 DIAGNOSIS — Z7952 Long term (current) use of systemic steroids: Secondary | ICD-10-CM | POA: Diagnosis not present

## 2018-01-14 DIAGNOSIS — Z8719 Personal history of other diseases of the digestive system: Secondary | ICD-10-CM | POA: Diagnosis not present

## 2018-01-14 DIAGNOSIS — R413 Other amnesia: Secondary | ICD-10-CM | POA: Diagnosis not present

## 2018-01-14 DIAGNOSIS — M4804 Spinal stenosis, thoracic region: Secondary | ICD-10-CM | POA: Diagnosis not present

## 2018-01-14 NOTE — Patient Instructions (Addendum)
Schedule right temporal artery biopsy at [facility]  Scheduled eye appointment: 01/16/2018 at 3:10 pm. Dr. Kathlynn Grate office.   Take Calcium 1200 mg p.o. daily, vitamin D 1000 units p.o. Daily  Schedule appointment with PCP to monitor blood sugar and blood pressure: Dr. Hilma Favors 01/17/2018 2:15pm   Repeat labs BMP and ESR in 1 month  Plan to taper prednisone in 1 month after labs.

## 2018-01-15 NOTE — Progress Notes (Signed)
Her ESR is a still elevated.  Please advise her to increase her prednisone to 4 mg p.o. daily.  We will repeat ESR in 1 month.

## 2018-01-16 ENCOUNTER — Telehealth: Payer: Self-pay | Admitting: *Deleted

## 2018-01-16 DIAGNOSIS — M316 Other giant cell arteritis: Secondary | ICD-10-CM

## 2018-01-16 DIAGNOSIS — E559 Vitamin D deficiency, unspecified: Secondary | ICD-10-CM

## 2018-01-16 MED ORDER — VITAMIN D (ERGOCALCIFEROL) 1.25 MG (50000 UNIT) PO CAPS: 50000.0000 [IU] | ORAL_CAPSULE | ORAL | 0 refills | Status: DC

## 2018-01-16 NOTE — Telephone Encounter (Signed)
-----  Message from Ofilia Neas, PA-C sent at 01/10/2018  1:23 PM EDT ----- CK is elevated.  We will recheck CK in 2 weeks when we recheck ESR.   Vitamin D is low. Please send in vitamin D 50,000 units by mouth once a week x3 months.  We will recheck vitamin D in 3 months.   Hgb is low.  We will continue to monitor.  Creatinine continues to trend up.  We will continue to monitor we next labs.  We will avoid starting MTX.

## 2018-01-16 NOTE — Telephone Encounter (Signed)
Patient has been advised of appointment that is scheduled with Dr. Kieth Brightly for January 24, 2018 at 3:30 pm. Patient advised to arrive by 3:00 pm/

## 2018-01-16 NOTE — Progress Notes (Signed)
Correction: Prednisone 40 mg p.o. daily

## 2018-01-17 DIAGNOSIS — R51 Headache: Secondary | ICD-10-CM | POA: Diagnosis not present

## 2018-01-17 DIAGNOSIS — Z79899 Other long term (current) drug therapy: Secondary | ICD-10-CM | POA: Diagnosis not present

## 2018-01-17 DIAGNOSIS — M069 Rheumatoid arthritis, unspecified: Secondary | ICD-10-CM | POA: Diagnosis not present

## 2018-01-17 LAB — BASIC METABOLIC PANEL WITH GFR
BUN/Creatinine Ratio: 23 (calc) — ABNORMAL HIGH (ref 6–22)
BUN: 32 mg/dL — ABNORMAL HIGH (ref 7–25)
CO2: 28 mmol/L (ref 20–32)
Calcium: 8.8 mg/dL (ref 8.6–10.4)
Chloride: 105 mmol/L (ref 98–110)
Creat: 1.41 mg/dL — ABNORMAL HIGH (ref 0.60–0.93)
GFR, Est African American: 43 mL/min/{1.73_m2} — ABNORMAL LOW (ref >=60)
GFR, Est Non African American: 37 mL/min/{1.73_m2} — ABNORMAL LOW (ref >=60)
Glucose, Bld: 89 mg/dL (ref 65–99)
Potassium: 4.3 mmol/L (ref 3.5–5.3)
Sodium: 141 mmol/L (ref 135–146)

## 2018-01-17 LAB — PROTEIN ELECTROPHORESIS, SERUM, WITH REFLEX
Albumin ELP: 3.5 g/dL — ABNORMAL LOW (ref 3.8–4.8)
Alpha 1: 0.3 g/dL (ref 0.2–0.3)
Alpha 2: 1 g/dL — ABNORMAL HIGH (ref 0.5–0.9)
Beta 2: 0.4 g/dL (ref 0.2–0.5)
Beta Globulin: 0.4 g/dL (ref 0.4–0.6)
Gamma Globulin: 1.1 g/dL (ref 0.8–1.7)
Total Protein: 6.6 g/dL (ref 6.1–8.1)

## 2018-01-17 LAB — IFE INTERPRETATION: Immunofix Electr Int: NOT DETECTED

## 2018-01-17 LAB — SEDIMENTATION RATE: Sed Rate: 43 mm/h — ABNORMAL HIGH (ref 0–30)

## 2018-01-17 LAB — CK: Total CK: 93 U/L (ref 29–143)

## 2018-01-22 DIAGNOSIS — R7309 Other abnormal glucose: Secondary | ICD-10-CM | POA: Diagnosis not present

## 2018-01-22 DIAGNOSIS — I10 Essential (primary) hypertension: Secondary | ICD-10-CM | POA: Diagnosis not present

## 2018-01-22 DIAGNOSIS — M316 Other giant cell arteritis: Secondary | ICD-10-CM | POA: Diagnosis not present

## 2018-01-22 DIAGNOSIS — Z1389 Encounter for screening for other disorder: Secondary | ICD-10-CM | POA: Diagnosis not present

## 2018-01-22 DIAGNOSIS — Z6841 Body Mass Index (BMI) 40.0 and over, adult: Secondary | ICD-10-CM | POA: Diagnosis not present

## 2018-01-22 DIAGNOSIS — M353 Polymyalgia rheumatica: Secondary | ICD-10-CM | POA: Diagnosis not present

## 2018-01-30 DIAGNOSIS — G894 Chronic pain syndrome: Secondary | ICD-10-CM | POA: Diagnosis not present

## 2018-01-30 DIAGNOSIS — M545 Low back pain: Secondary | ICD-10-CM | POA: Diagnosis not present

## 2018-01-30 DIAGNOSIS — Z79891 Long term (current) use of opiate analgesic: Secondary | ICD-10-CM | POA: Diagnosis not present

## 2018-01-30 DIAGNOSIS — M48061 Spinal stenosis, lumbar region without neurogenic claudication: Secondary | ICD-10-CM | POA: Diagnosis not present

## 2018-01-30 NOTE — Progress Notes (Deleted)
Office Visit Note  Patient: Nicole [name]             Date of Birth: Nov 30, 1945           MRN: [id number]             PCP: Sharilyn Sites, MD Referring: Sharilyn Sites, MD Visit Date: 02/12/2018 Occupation: @GUAROCC @    Subjective:  No chief complaint on file.   History of Present Illness: Nicole [name] is a 72 y.o. female ***   Activities of Daily Living:  Patient reports morning stiffness for *** {minute/hour:19697}.   Patient {ACTIONS;DENIES/REPORTS:21021675::"Denies"} nocturnal pain.  Difficulty dressing/grooming: {ACTIONS;DENIES/REPORTS:21021675::"Denies"} Difficulty climbing stairs: {ACTIONS;DENIES/REPORTS:21021675::"Denies"} Difficulty getting out of chair: {ACTIONS;DENIES/REPORTS:21021675::"Denies"} Difficulty using hands for taps, buttons, cutlery, and/or writing: {ACTIONS;DENIES/REPORTS:21021675::"Denies"}   No Rheumatology ROS completed.   PMFS History:  Patient Active Problem List   Diagnosis Date Noted  . On prednisone therapy 05/31/2017  . Positive anti-CCP test 05/31/2017  . Rheumatoid factor positive 05/31/2017  . Memory loss 04/04/2017  . Polymyalgia rheumatica ([place]) 04/04/2017  . High risk medication use 12/07/2016  . DDD (degenerative disc disease), lumbar 09/27/2016  . HTN (hypertension) 06/05/2016  . Chronic pain syndrome 06/05/2016  . Acute diverticulitis 06/02/2016  . Diverticulitis large intestine 06/02/2016  . Spinal stenosis of lumbar region 02/29/2016  . Joint pain 02/29/2016  . Endometrial polyp 02/06/2014  . Postmenopausal bleeding 01/30/2014  . GERD (gastroesophageal reflux disease) 09/29/2013  . Spinal stenosis, thoracic 09/29/2013  . Shingles 09/29/2013  . Thoracic or lumbosacral neuritis or radiculitis, unspecified 05/04/2011  . Abnormality of gait 05/04/2011  . Muscle weakness (generalized) 05/04/2011  . Bilateral primary osteoarthritis of knee 04/07/2009  . KNEE PAIN 04/07/2009    Past Medical History:  Diagnosis Date  . AC  (acromioclavicular) joint bone spurs    lt shoulder  . Anemia   . Arthritis   . Carpal tunnel syndrome, bilateral   . Gastroesophageal reflux   . Headache    recent visit to ER @ [facility] for severe headache  . Hypertension   . Lumbar stenosis    Hx of ESIs by Dr. Nelva Bush  . Polyarthralgia   . Polymyalgia ([place])   . Shingles   . Spinal stenosis     Family History  Problem Relation Age of Onset  . Hypertension Mother   . Pneumonia Father   . Arthritis Sister    Past Surgical History:  Procedure Laterality Date  . CHOLECYSTECTOMY    . COLONOSCOPY  06/11/2012   Procedure: COLONOSCOPY;  Surgeon: Jamesetta So, MD;  [facility];  Service: Gastroenterology;  Laterality: N/A;  . HYSTEROSCOPY W/D&C N/A 02/25/2014   Procedure: DILATATION AND CURETTAGE /HYSTEROSCOPY;  Surgeon: Florian Buff, MD;  Location: [facility];  Service: Gynecology;  Laterality: N/A;  . POLYPECTOMY N/A 02/25/2014   Procedure: POLYPECTOMY;  Surgeon: Florian Buff, MD;  Location[facility];  Service: Gynecology;  Laterality: N/A;  . RESECTION DISTAL CLAVICAL Right 03/26/2015   Procedure: OPEN DISTAL CLAVICAL RESECTION ;  Surgeon: Netta Cedars, MD;  Location: [place];  Service: Orthopedics;  Laterality: Right;   Social History   Social History Narrative   Lives at home alone.   Right-handed.   1-2 cups caffeine daily.     Objective: Vital Signs: There were no vitals taken for this visit.   Physical Exam   Musculoskeletal Exam: ***  CDAI Exam: No CDAI exam completed.    Investigation: No additional findings.PLQ eye exam: 12/13/2017 CBC Latest  Ref Rng & Units 01/09/2018 12/18/2017 06/07/2017  WBC 3.8 - 10.8 Thousand/uL 6.1 7.3 8.1  Hemoglobin 11.7 - 15.5 g/dL 11.4(L) 12.2 12.2  Hematocrit 35.0 - 45.0 % 33.9(L) 35.6 37.0  Platelets 140 - 400 Thousand/uL 290 295 338   CMP Latest Ref Rng & Units 01/14/2018 01/09/2018 12/18/2017  Glucose 65 - 99 mg/dL 89 91 80  BUN 7 - 25 mg/dL 32(H) 22 16  Creatinine 0.60 -  0.93 mg/dL 1.41(H) 1.51(H) 1.47(H)  Sodium 135 - 146 mmol/L 141 142 141  Potassium 3.5 - 5.3 mmol/L 4.3 4.5 4.8  Chloride 98 - 110 mmol/L 105 106 101  CO2 20 - 32 mmol/L 28 29 25   Calcium 8.6 - 10.4 mg/dL 8.8 9.1 9.5  Total Protein 6.1 - 8.1 g/dL 6.6 6.4 7.2  Total Bilirubin 0.2 - 1.2 mg/dL - 0.4 0.4  Alkaline Phos 39 - 117 IU/L - - 146(H)  AST 10 - 35 U/L - 17 22  ALT 6 - 29 U/L - 15 21    Imaging: No results found.  Speciality Comments: PLQ Eye Exam: 12/13/17 WNl @ [place]    Procedures:  No procedures performed Allergies: Oxycontin [oxycodone hcl]; Sulfa antibiotics; Tramadol; Penicillins; and Sulfasalazine   Assessment / Plan:     Visit Diagnoses: No diagnosis found.    Orders: No orders of the defined types were placed in this encounter.  No orders of the defined types were placed in this encounter.   Face-to-face time spent with patient was *** minutes. 50% of time was spent in counseling and coordination of care.  Follow-Up Instructions: No follow-ups on file.   Earnestine Mealing, CMA  Note - This record has been created using Editor, commissioning.  Chart creation errors have been sought, but may not always  have been located. Such creation errors do not reflect on  the standard of medical care.

## 2018-01-31 NOTE — Progress Notes (Signed)
Office Visit Note  Patient: Nicole [name]             Date of Birth: [birth year]           MRN: [id number]             PCP: Sharilyn Sites, MD Referring: Sharilyn Sites, MD Visit Date: 02/12/2018 Occupation: _0 @    Subjective:  Medication management   History of Present Illness: Nicole [name] is a 72 y.o. female history of polymyalgia rheumatica and possible temporal arteritis.  She states that she is not having muscle pain as she was having before.  She continues to have some generalized weakness.  She states she has gained weight and also had noticed increased fluid retention.  She denies any headaches.  She denies any joint swelling.  She is still on prednisone 40 mg p.o. daily.  She states she had to postpone her temporal artery biopsy which is a scheduled sometimes next month.  Activities of Daily Living:  Patient reports morning stiffness for all days hours.   Patient Denies nocturnal pain.  Difficulty dressing/grooming: Reports Difficulty climbing stairs: Reports Difficulty getting out of chair: Reports Difficulty using hands for taps, buttons, cutlery, and/or writing: Denies   Review of Systems  Constitutional: Positive for fatigue. Negative for night sweats, weight gain and weight loss.  HENT: Negative for mouth sores, trouble swallowing, trouble swallowing, mouth dryness and nose dryness.   Eyes: Negative for pain, redness, visual disturbance and dryness.  Respiratory: Negative for cough, shortness of breath and difficulty breathing.   Cardiovascular: Positive for swelling in legs/feet. Negative for chest pain, palpitations, hypertension and irregular heartbeat.  Gastrointestinal: Negative for blood in stool, constipation and diarrhea.  Endocrine: Negative for increased urination.  Genitourinary: Negative for vaginal dryness.  Musculoskeletal: Negative for arthralgias, joint pain, joint swelling, myalgias, muscle weakness, morning stiffness, muscle tenderness and  myalgias.  Skin: Negative for color change, rash, hair loss, skin tightness, ulcers and sensitivity to sunlight.  Allergic/Immunologic: Negative for susceptible to infections.  Neurological: Positive for weakness. Negative for dizziness, memory loss and night sweats.       Left lower extremity weakness   Hematological: Negative for swollen glands.  Psychiatric/Behavioral: Negative for depressed mood and sleep disturbance. The patient is not nervous/anxious.     PMFS History:  Patient Active Problem List   Diagnosis Date Noted  . On prednisone therapy 05/31/2017  . Positive anti-CCP test 05/31/2017  . Rheumatoid factor positive 05/31/2017  . Memory loss 04/04/2017  . Polymyalgia rheumatica ([place]) 04/04/2017  . High risk medication use 12/07/2016  . DDD (degenerative disc disease), lumbar 09/27/2016  . HTN (hypertension) 06/05/2016  . Chronic pain syndrome 06/05/2016  . Acute diverticulitis 06/02/2016  . Diverticulitis large intestine 06/02/2016  . Spinal stenosis of lumbar region 02/29/2016  . Joint pain 02/29/2016  . Endometrial polyp 02/06/2014  . Postmenopausal bleeding 01/30/2014  . GERD (gastroesophageal reflux disease) 09/29/2013  . Spinal stenosis, thoracic 09/29/2013  . Shingles 09/29/2013  . Thoracic or lumbosacral neuritis or radiculitis, unspecified 05/04/2011  . Abnormality of gait 05/04/2011  . Muscle weakness (generalized) 05/04/2011  . Bilateral primary osteoarthritis of knee 04/07/2009  . KNEE PAIN 04/07/2009    Past Medical History:  Diagnosis Date  . AC (acromioclavicular) joint bone spurs    lt shoulder  . Anemia   . Arthritis   . Carpal tunnel syndrome, bilateral   . Gastroesophageal reflux   . Headache    recent visit to ER @ [facility] for severe headache  . Hypertension   . Lumbar stenosis    Hx of ESIs by Dr. Nelva Bush  . Polyarthralgia   . Polymyalgia ([place])   . Shingles   . Spinal stenosis     Family History  Problem Relation Age of Onset  .  Hypertension Mother   . Pneumonia Father   . Arthritis Sister    Past Surgical History:  Procedure Laterality Date  . CHOLECYSTECTOMY    . COLONOSCOPY  06/11/2012   Procedure: COLONOSCOPY;  Surgeon: Jamesetta So, MD;  [facility];  Service: Gastroenterology;  Laterality: N/A;  . HYSTEROSCOPY W/D&C N/A 02/25/2014   Procedure: DILATATION AND CURETTAGE /HYSTEROSCOPY;  Surgeon: Florian Buff, MD;  Location[facility];  Service: Gynecology;  Laterality: N/A;  . POLYPECTOMY N/A 02/25/2014   Procedure: POLYPECTOMY;  Surgeon: Florian Buff, MD;  Location[facility];  Service: Gynecology;  Laterality: N/A;  . RESECTION DISTAL CLAVICAL Right 03/26/2015   Procedure: OPEN DISTAL CLAVICAL RESECTION ;  Surgeon: Netta Cedars, MD;  Location: [place];  Service: Orthopedics;  Laterality: Right;   Social History   Social History Narrative   Lives at home alone.   Right-handed.   1-2 cups caffeine daily.     Objective: Vital Signs: BP 131/82 (BP Location: Right Wrist, Patient Position: Sitting, Cuff Size: Normal)   Pulse (!) 103   Resp 19   Ht 5' (1.524 m)   Wt 285 lb (129.3 kg)   BMI 55.66 kg/m    Physical Exam  Constitutional: She is oriented to person, place, and time. She appears well-developed and well-nourished.  HENT:  Head: Normocephalic and atraumatic.  Eyes: Conjunctivae and EOM are normal.  Neck: Normal range of motion.  Cardiovascular: Normal rate, regular rhythm, normal heart sounds and intact distal pulses.  Pulmonary/Chest: Effort normal and breath sounds normal.  Abdominal: Soft. Bowel sounds are normal.  Musculoskeletal: She exhibits edema.  Pedal edema over bilateral lower extremities.  Lymphadenopathy:    She has no cervical adenopathy.  Neurological: She is alert and oriented to person, place, and time.  Skin: Skin is warm and dry. Capillary refill takes less than 2 seconds.  Psychiatric: She has a normal mood and affect. Her behavior is normal.  Nursing note and  vitals reviewed.    Musculoskeletal Exam: Patient is mobilizing with the help of a walker.  She has shoulders with abduction up to 90 degrees without much discomfort.  She had no muscular tenderness on examination today.  She has difficulty lifting her left lower extremity.  No joint swelling or synovitis was noted.  CDAI Exam: No CDAI exam completed.    Investigation: No additional findings. CBC Latest Ref Rng & Units 01/09/2018 12/18/2017 06/07/2017  WBC 3.8 - 10.8 Thousand/uL 6.1 7.3 8.1  Hemoglobin 11.7 - 15.5 g/dL 11.4(L) 12.2 12.2  Hematocrit 35.0 - 45.0 % 33.9(L) 35.6 37.0  Platelets 140 - 400 Thousand/uL 290 295 338   CMP Latest Ref Rng & Units 01/14/2018 01/09/2018 12/18/2017  Glucose 65 - 99 mg/dL 89 91 80  BUN 7 - 25 mg/dL 32(H) 22 16  Creatinine 0.60 - 0.93 mg/dL 1.41(H) 1.51(H) 1.47(H)  Sodium 135 - 146 mmol/L 141 142 141  Potassium 3.5 - 5.3 mmol/L 4.3 4.5 4.8  Chloride 98 - 110 mmol/L 105 106 101  CO2 20 - 32 mmol/L _0 Calcium 8.6 - 10.4 mg/dL 8.8 9.1 9.5  Total Protein 6.1 - 8.1 g/dL 6.6 6.4  7.2  Total Bilirubin 0.2 - 1.2 mg/dL - 0.4 0.4  Alkaline Phos 39 - 117 IU/L - - 146(H)  AST 10 - 35 U/L - 17 22  ALT 6 - 29 U/L - 15 21  ESR 43  Imaging: No results found.  Speciality Comments: PLQ Eye Exam: 12/13/17 WNl @ [facility]    Procedures:  No procedures performed Allergies: Oxycontin [oxycodone hcl]; Sulfa antibiotics; Tramadol; Penicillins; and Sulfasalazine   Assessment / Plan:     Visit Diagnoses: Polymyalgia rheumatica ([place]) -patient clinically is doing better without any muscle pain although she does have some generalized deconditioning.  I had detailed discussion with her to be more active.  She is also still on prednisone 40 mg a day because of suspicion of temporal arteritis.  I will start tapering her prednisone by 5 mg every 2 weeks until she reaches 20 mg p.o. daily.  Plan: Sedimentation rate  Temporal arteritis ([facility])-she has no temporal  artery tenderness today.  She states she has not been to the surgeon for the temporal artery biopsy.  She is scheduled the biopsy for next month.  I did discuss that the results of having positive biopsy will be less promising as there has been a delay in the biopsy.  On prednisone therapy - 20 mg BID.  She is gained a lot of weight.  I have advised her to be more active and watch her sugar and salt intake.  High risk medication use - PLQ 200 mg BIDeye exam was normal per patient I do not have the results available.- Plan: CBC with Differential/Platelet, COMPLETE METABOLIC PANEL WITH GFR  Positive anti-CCP test-she has no clinical features of rheumatoid arthritis.  Rheumatoid factor positive  Other medical problems are listed as follows:  Chronic pain syndrome  Spinal stenosis, thoracic  Primary osteoarthritis of both knees-chronic pain and difficulty with mobility  DDD (degenerative disc disease), lumbar-chronic pain and difficulty with mobility  Memory loss  History of diverticulitis  History of gastroesophageal reflux (GERD)  History of hypertension    Orders: Orders Placed This Encounter  Procedures  . CBC with Differential/Platelet  . COMPLETE METABOLIC PANEL WITH GFR  . Sedimentation rate   Meds ordered this encounter  Medications  . predniSONE (DELTASONE) 10 MG tablet    Sig: Take 3.5 tablets daily x2 weeks, then take 3 tablets daily x2 weeks, then take 2.5 tablets daily x2 weeks, then take 2 tablets daily.    Dispense:  154 tablet    Refill:  0    Face-to-face time spent with patient was  minutes.  Greater than 50% of time was spent in counseling and coordination of care.  Follow-Up Instructions: Return in about 2 months (around 04/14/2018) for PMR, TA.   Bo Merino, MD  Note - This record has been created using Editor, commissioning.  Chart creation errors have been sought, but may not always  have been located. Such creation errors do not reflect on  the  standard of medical care.

## 2018-02-04 ENCOUNTER — Telehealth: Payer: Self-pay | Admitting: Rheumatology

## 2018-02-04 MED ORDER — PREDNISONE 10 MG PO TABS: 20.0000 mg | ORAL_TABLET | Freq: Two times a day (BID) | ORAL | 0 refills | Status: DC

## 2018-02-04 NOTE — Telephone Encounter (Signed)
Last Visit: 01/14/18 Next visit: 02/12/18  Okay to refill per Dr. Estanislado Pandy

## 2018-02-04 NOTE — Telephone Encounter (Signed)
Patient called requesting prescription refill of Prednisone 10 mg.  Patient's pharmacy is [facility] in [place].

## 2018-02-12 ENCOUNTER — Ambulatory Visit: Payer: Medicare Other | Admitting: Rheumatology

## 2018-02-12 ENCOUNTER — Ambulatory Visit (INDEPENDENT_AMBULATORY_CARE_PROVIDER_SITE_OTHER): Payer: Medicare Other | Admitting: Rheumatology

## 2018-02-12 ENCOUNTER — Encounter: Payer: Self-pay | Admitting: Physician Assistant

## 2018-02-12 VITALS — BP 131/82 | HR 103 | Resp 19 | Ht 60.0 in | Wt 285.0 lb

## 2018-02-12 DIAGNOSIS — R768 Other specified abnormal immunological findings in serum: Secondary | ICD-10-CM | POA: Diagnosis not present

## 2018-02-12 DIAGNOSIS — M170 Bilateral primary osteoarthritis of knee: Secondary | ICD-10-CM | POA: Diagnosis not present

## 2018-02-12 DIAGNOSIS — Z8719 Personal history of other diseases of the digestive system: Secondary | ICD-10-CM | POA: Diagnosis not present

## 2018-02-12 DIAGNOSIS — M5136 Other intervertebral disc degeneration, lumbar region: Secondary | ICD-10-CM

## 2018-02-12 DIAGNOSIS — R748 Abnormal levels of other serum enzymes: Secondary | ICD-10-CM | POA: Diagnosis not present

## 2018-02-12 DIAGNOSIS — R413 Other amnesia: Secondary | ICD-10-CM

## 2018-02-12 DIAGNOSIS — G894 Chronic pain syndrome: Secondary | ICD-10-CM | POA: Diagnosis not present

## 2018-02-12 DIAGNOSIS — Z79899 Other long term (current) drug therapy: Secondary | ICD-10-CM | POA: Diagnosis not present

## 2018-02-12 DIAGNOSIS — Z8679 Personal history of other diseases of the circulatory system: Secondary | ICD-10-CM

## 2018-02-12 DIAGNOSIS — M51369 Other intervertebral disc degeneration, lumbar region without mention of lumbar back pain or lower extremity pain: Secondary | ICD-10-CM

## 2018-02-12 DIAGNOSIS — M353 Polymyalgia rheumatica: Secondary | ICD-10-CM

## 2018-02-12 DIAGNOSIS — Z7952 Long term (current) use of systemic steroids: Secondary | ICD-10-CM | POA: Diagnosis not present

## 2018-02-12 DIAGNOSIS — M316 Other giant cell arteritis: Secondary | ICD-10-CM | POA: Diagnosis not present

## 2018-02-12 DIAGNOSIS — M4804 Spinal stenosis, thoracic region: Secondary | ICD-10-CM | POA: Diagnosis not present

## 2018-02-12 DIAGNOSIS — R7689 Other specified abnormal immunological findings in serum: Secondary | ICD-10-CM

## 2018-02-12 DIAGNOSIS — R7681 Abnormal rheumatoid factor and anti-citrullinated protein antibody without rheumatoid arthritis: Secondary | ICD-10-CM

## 2018-02-12 LAB — COMPLETE METABOLIC PANEL WITH GFR
AG Ratio: 1.4 (calc) (ref 1.0–2.5)
ALT: 43 U/L — ABNORMAL HIGH (ref 6–29)
AST: 19 U/L (ref 10–35)
Albumin: 3.4 g/dL — ABNORMAL LOW (ref 3.6–5.1)
Alkaline phosphatase (APISO): 111 U/L (ref 33–130)
BUN/Creatinine Ratio: 27 (calc) — ABNORMAL HIGH (ref 6–22)
BUN: 40 mg/dL — ABNORMAL HIGH (ref 7–25)
CO2: 29 mmol/L (ref 20–32)
Calcium: 9.1 mg/dL (ref 8.6–10.4)
Chloride: 106 mmol/L (ref 98–110)
Creat: 1.47 mg/dL — ABNORMAL HIGH (ref 0.60–0.93)
GFR, Est African American: 41 mL/min/{1.73_m2} — ABNORMAL LOW (ref >=60)
GFR, Est Non African American: 36 mL/min/{1.73_m2} — ABNORMAL LOW (ref >=60)
Globulin: 2.5 g/dL (calc) (ref 1.9–3.7)
Glucose, Bld: 123 mg/dL — ABNORMAL HIGH (ref 65–99)
Potassium: 4.8 mmol/L (ref 3.5–5.3)
Sodium: 141 mmol/L (ref 135–146)
Total Bilirubin: 0.4 mg/dL (ref 0.2–1.2)
Total Protein: 5.9 g/dL — ABNORMAL LOW (ref 6.1–8.1)

## 2018-02-12 LAB — CBC WITH DIFFERENTIAL/PLATELET
Basophils Absolute: 22 cells/uL (ref 0–200)
Basophils Relative: 0.2 %
Eosinophils Absolute: 22 cells/uL (ref 15–500)
Eosinophils Relative: 0.2 %
HCT: 36.4 % (ref 35.0–45.0)
Hemoglobin: 12.1 g/dL (ref 11.7–15.5)
Lymphs Abs: 2049 cells/uL (ref 850–3900)
MCH: 28.7 pg (ref 27.0–33.0)
MCHC: 33.2 g/dL (ref 32.0–36.0)
MCV: 86.3 fL (ref 80.0–100.0)
MPV: 8.8 fL (ref 7.5–12.5)
Monocytes Relative: 7.2 %
Neutro Abs: 8022 cells/uL — ABNORMAL HIGH (ref 1500–7800)
Neutrophils Relative %: 73.6 %
Platelets: 299 10*3/uL (ref 140–400)
RBC: 4.22 10*6/uL (ref 3.80–5.10)
RDW: 14.9 % (ref 11.0–15.0)
Total Lymphocyte: 18.8 %
WBC mixed population: 785 cells/uL (ref 200–950)
WBC: 10.9 10*3/uL — ABNORMAL HIGH (ref 3.8–10.8)

## 2018-02-12 LAB — SEDIMENTATION RATE: Sed Rate: 22 mm/h (ref 0–30)

## 2018-02-12 MED ORDER — PREDNISONE 10 MG PO TABS: ORAL_TABLET | ORAL | 0 refills | Status: DC

## 2018-02-12 NOTE — Patient Instructions (Signed)
Please a scheduled appointment for temporal artery biopsy. Take prednisone 35 mg p.o. daily for 2 weeks Take prednisone 30 mg p.o. daily for 2 weeks Take prednisone 25 mg p.o. daily for 2 weeks Then stay on prednisone 20 mg daily Avoid sugar and increased salt intake.

## 2018-02-13 NOTE — Progress Notes (Signed)
Sed rate is better.  She may continue to taper prednisone as discussed yesterday.

## 2018-02-15 DIAGNOSIS — Z681 Body mass index (BMI) 19 or less, adult: Secondary | ICD-10-CM | POA: Diagnosis not present

## 2018-02-15 DIAGNOSIS — Z7409 Other reduced mobility: Secondary | ICD-10-CM | POA: Diagnosis not present

## 2018-02-15 DIAGNOSIS — M316 Other giant cell arteritis: Secondary | ICD-10-CM | POA: Diagnosis not present

## 2018-02-15 DIAGNOSIS — M48061 Spinal stenosis, lumbar region without neurogenic claudication: Secondary | ICD-10-CM | POA: Diagnosis not present

## 2018-02-15 DIAGNOSIS — I10 Essential (primary) hypertension: Secondary | ICD-10-CM | POA: Diagnosis not present

## 2018-02-15 DIAGNOSIS — M069 Rheumatoid arthritis, unspecified: Secondary | ICD-10-CM | POA: Diagnosis not present

## 2018-02-15 DIAGNOSIS — M353 Polymyalgia rheumatica: Secondary | ICD-10-CM | POA: Diagnosis not present

## 2018-02-15 DIAGNOSIS — R600 Localized edema: Secondary | ICD-10-CM | POA: Diagnosis not present

## 2018-02-22 DIAGNOSIS — B0229 Other postherpetic nervous system involvement: Secondary | ICD-10-CM | POA: Diagnosis not present

## 2018-02-22 DIAGNOSIS — R600 Localized edema: Secondary | ICD-10-CM | POA: Diagnosis not present

## 2018-02-22 DIAGNOSIS — M069 Rheumatoid arthritis, unspecified: Secondary | ICD-10-CM | POA: Diagnosis not present

## 2018-02-22 DIAGNOSIS — R0602 Shortness of breath: Secondary | ICD-10-CM | POA: Diagnosis not present

## 2018-02-22 DIAGNOSIS — Z6841 Body Mass Index (BMI) 40.0 and over, adult: Secondary | ICD-10-CM | POA: Diagnosis not present

## 2018-02-22 DIAGNOSIS — I10 Essential (primary) hypertension: Secondary | ICD-10-CM | POA: Diagnosis not present

## 2018-02-27 DIAGNOSIS — I10 Essential (primary) hypertension: Secondary | ICD-10-CM | POA: Diagnosis not present

## 2018-02-27 DIAGNOSIS — M316 Other giant cell arteritis: Secondary | ICD-10-CM | POA: Diagnosis not present

## 2018-02-27 DIAGNOSIS — M069 Rheumatoid arthritis, unspecified: Secondary | ICD-10-CM | POA: Diagnosis not present

## 2018-02-27 DIAGNOSIS — M48061 Spinal stenosis, lumbar region without neurogenic claudication: Secondary | ICD-10-CM | POA: Diagnosis not present

## 2018-02-27 DIAGNOSIS — M353 Polymyalgia rheumatica: Secondary | ICD-10-CM | POA: Diagnosis not present

## 2018-02-27 DIAGNOSIS — M5030 Other cervical disc degeneration, unspecified cervical region: Secondary | ICD-10-CM | POA: Diagnosis not present

## 2018-02-28 DIAGNOSIS — M316 Other giant cell arteritis: Secondary | ICD-10-CM | POA: Diagnosis not present

## 2018-02-28 DIAGNOSIS — I10 Essential (primary) hypertension: Secondary | ICD-10-CM | POA: Diagnosis not present

## 2018-02-28 DIAGNOSIS — M353 Polymyalgia rheumatica: Secondary | ICD-10-CM | POA: Diagnosis not present

## 2018-02-28 DIAGNOSIS — M48061 Spinal stenosis, lumbar region without neurogenic claudication: Secondary | ICD-10-CM | POA: Diagnosis not present

## 2018-02-28 DIAGNOSIS — M5030 Other cervical disc degeneration, unspecified cervical region: Secondary | ICD-10-CM | POA: Diagnosis not present

## 2018-02-28 DIAGNOSIS — M069 Rheumatoid arthritis, unspecified: Secondary | ICD-10-CM | POA: Diagnosis not present

## 2018-03-04 DIAGNOSIS — M353 Polymyalgia rheumatica: Secondary | ICD-10-CM | POA: Diagnosis not present

## 2018-03-04 DIAGNOSIS — M069 Rheumatoid arthritis, unspecified: Secondary | ICD-10-CM | POA: Diagnosis not present

## 2018-03-04 DIAGNOSIS — M316 Other giant cell arteritis: Secondary | ICD-10-CM | POA: Diagnosis not present

## 2018-03-04 DIAGNOSIS — I10 Essential (primary) hypertension: Secondary | ICD-10-CM | POA: Diagnosis not present

## 2018-03-04 DIAGNOSIS — M48061 Spinal stenosis, lumbar region without neurogenic claudication: Secondary | ICD-10-CM | POA: Diagnosis not present

## 2018-03-04 DIAGNOSIS — M5030 Other cervical disc degeneration, unspecified cervical region: Secondary | ICD-10-CM | POA: Diagnosis not present

## 2018-03-05 DIAGNOSIS — M48061 Spinal stenosis, lumbar region without neurogenic claudication: Secondary | ICD-10-CM | POA: Diagnosis not present

## 2018-03-05 DIAGNOSIS — I10 Essential (primary) hypertension: Secondary | ICD-10-CM | POA: Diagnosis not present

## 2018-03-05 DIAGNOSIS — M069 Rheumatoid arthritis, unspecified: Secondary | ICD-10-CM | POA: Diagnosis not present

## 2018-03-05 DIAGNOSIS — M316 Other giant cell arteritis: Secondary | ICD-10-CM | POA: Diagnosis not present

## 2018-03-05 DIAGNOSIS — M5030 Other cervical disc degeneration, unspecified cervical region: Secondary | ICD-10-CM | POA: Diagnosis not present

## 2018-03-05 DIAGNOSIS — M353 Polymyalgia rheumatica: Secondary | ICD-10-CM | POA: Diagnosis not present

## 2018-03-06 DIAGNOSIS — M353 Polymyalgia rheumatica: Secondary | ICD-10-CM | POA: Diagnosis not present

## 2018-03-06 DIAGNOSIS — M316 Other giant cell arteritis: Secondary | ICD-10-CM | POA: Diagnosis not present

## 2018-03-06 DIAGNOSIS — I10 Essential (primary) hypertension: Secondary | ICD-10-CM | POA: Diagnosis not present

## 2018-03-06 DIAGNOSIS — M069 Rheumatoid arthritis, unspecified: Secondary | ICD-10-CM | POA: Diagnosis not present

## 2018-03-06 DIAGNOSIS — M48061 Spinal stenosis, lumbar region without neurogenic claudication: Secondary | ICD-10-CM | POA: Diagnosis not present

## 2018-03-06 DIAGNOSIS — M5030 Other cervical disc degeneration, unspecified cervical region: Secondary | ICD-10-CM | POA: Diagnosis not present

## 2018-03-08 DIAGNOSIS — M069 Rheumatoid arthritis, unspecified: Secondary | ICD-10-CM | POA: Diagnosis not present

## 2018-03-08 DIAGNOSIS — M353 Polymyalgia rheumatica: Secondary | ICD-10-CM | POA: Diagnosis not present

## 2018-03-08 DIAGNOSIS — I10 Essential (primary) hypertension: Secondary | ICD-10-CM | POA: Diagnosis not present

## 2018-03-08 DIAGNOSIS — M316 Other giant cell arteritis: Secondary | ICD-10-CM | POA: Diagnosis not present

## 2018-03-08 DIAGNOSIS — M48061 Spinal stenosis, lumbar region without neurogenic claudication: Secondary | ICD-10-CM | POA: Diagnosis not present

## 2018-03-08 DIAGNOSIS — M5030 Other cervical disc degeneration, unspecified cervical region: Secondary | ICD-10-CM | POA: Diagnosis not present

## 2018-03-11 DIAGNOSIS — M316 Other giant cell arteritis: Secondary | ICD-10-CM | POA: Diagnosis not present

## 2018-03-11 DIAGNOSIS — M48061 Spinal stenosis, lumbar region without neurogenic claudication: Secondary | ICD-10-CM | POA: Diagnosis not present

## 2018-03-11 DIAGNOSIS — M5030 Other cervical disc degeneration, unspecified cervical region: Secondary | ICD-10-CM | POA: Diagnosis not present

## 2018-03-11 DIAGNOSIS — M069 Rheumatoid arthritis, unspecified: Secondary | ICD-10-CM | POA: Diagnosis not present

## 2018-03-11 DIAGNOSIS — I10 Essential (primary) hypertension: Secondary | ICD-10-CM | POA: Diagnosis not present

## 2018-03-11 DIAGNOSIS — M353 Polymyalgia rheumatica: Secondary | ICD-10-CM | POA: Diagnosis not present

## 2018-03-12 DIAGNOSIS — M48061 Spinal stenosis, lumbar region without neurogenic claudication: Secondary | ICD-10-CM | POA: Diagnosis not present

## 2018-03-12 DIAGNOSIS — I10 Essential (primary) hypertension: Secondary | ICD-10-CM | POA: Diagnosis not present

## 2018-03-12 DIAGNOSIS — M316 Other giant cell arteritis: Secondary | ICD-10-CM | POA: Diagnosis not present

## 2018-03-12 DIAGNOSIS — M353 Polymyalgia rheumatica: Secondary | ICD-10-CM | POA: Diagnosis not present

## 2018-03-12 DIAGNOSIS — M5030 Other cervical disc degeneration, unspecified cervical region: Secondary | ICD-10-CM | POA: Diagnosis not present

## 2018-03-12 DIAGNOSIS — M069 Rheumatoid arthritis, unspecified: Secondary | ICD-10-CM | POA: Diagnosis not present

## 2018-03-13 ENCOUNTER — Other Ambulatory Visit: Payer: Self-pay | Admitting: Rheumatology

## 2018-03-13 DIAGNOSIS — M316 Other giant cell arteritis: Secondary | ICD-10-CM | POA: Diagnosis not present

## 2018-03-13 DIAGNOSIS — I10 Essential (primary) hypertension: Secondary | ICD-10-CM | POA: Diagnosis not present

## 2018-03-13 DIAGNOSIS — M5030 Other cervical disc degeneration, unspecified cervical region: Secondary | ICD-10-CM | POA: Diagnosis not present

## 2018-03-13 DIAGNOSIS — M48061 Spinal stenosis, lumbar region without neurogenic claudication: Secondary | ICD-10-CM | POA: Diagnosis not present

## 2018-03-13 DIAGNOSIS — M353 Polymyalgia rheumatica: Secondary | ICD-10-CM | POA: Diagnosis not present

## 2018-03-13 DIAGNOSIS — M069 Rheumatoid arthritis, unspecified: Secondary | ICD-10-CM | POA: Diagnosis not present

## 2018-03-13 NOTE — Telephone Encounter (Signed)
Last visit: 02/12/2018 Next visit: 04/16/2018 Labs: 02/12/2018 Eye exam: 12/13/2017  Okay to refill per Dr. Estanislado Pandy.

## 2018-03-14 DIAGNOSIS — M316 Other giant cell arteritis: Secondary | ICD-10-CM | POA: Diagnosis not present

## 2018-03-14 DIAGNOSIS — M353 Polymyalgia rheumatica: Secondary | ICD-10-CM | POA: Diagnosis not present

## 2018-03-14 DIAGNOSIS — M48061 Spinal stenosis, lumbar region without neurogenic claudication: Secondary | ICD-10-CM | POA: Diagnosis not present

## 2018-03-14 DIAGNOSIS — M069 Rheumatoid arthritis, unspecified: Secondary | ICD-10-CM | POA: Diagnosis not present

## 2018-03-14 DIAGNOSIS — I10 Essential (primary) hypertension: Secondary | ICD-10-CM | POA: Diagnosis not present

## 2018-03-14 DIAGNOSIS — M5030 Other cervical disc degeneration, unspecified cervical region: Secondary | ICD-10-CM | POA: Diagnosis not present

## 2018-03-15 ENCOUNTER — Encounter: Payer: Self-pay | Admitting: Rheumatology

## 2018-03-15 DIAGNOSIS — M069 Rheumatoid arthritis, unspecified: Secondary | ICD-10-CM | POA: Diagnosis not present

## 2018-03-15 DIAGNOSIS — I10 Essential (primary) hypertension: Secondary | ICD-10-CM | POA: Diagnosis not present

## 2018-03-15 DIAGNOSIS — N179 Acute kidney failure, unspecified: Secondary | ICD-10-CM | POA: Diagnosis not present

## 2018-03-15 DIAGNOSIS — R600 Localized edema: Secondary | ICD-10-CM | POA: Diagnosis not present

## 2018-03-15 DIAGNOSIS — Z681 Body mass index (BMI) 19 or less, adult: Secondary | ICD-10-CM | POA: Diagnosis not present

## 2018-03-15 DIAGNOSIS — M353 Polymyalgia rheumatica: Secondary | ICD-10-CM | POA: Diagnosis not present

## 2018-03-15 DIAGNOSIS — M316 Other giant cell arteritis: Secondary | ICD-10-CM | POA: Diagnosis not present

## 2018-03-19 DIAGNOSIS — I10 Essential (primary) hypertension: Secondary | ICD-10-CM | POA: Diagnosis not present

## 2018-03-19 DIAGNOSIS — M353 Polymyalgia rheumatica: Secondary | ICD-10-CM | POA: Diagnosis not present

## 2018-03-19 DIAGNOSIS — M316 Other giant cell arteritis: Secondary | ICD-10-CM | POA: Diagnosis not present

## 2018-03-19 DIAGNOSIS — M48061 Spinal stenosis, lumbar region without neurogenic claudication: Secondary | ICD-10-CM | POA: Diagnosis not present

## 2018-03-19 DIAGNOSIS — M069 Rheumatoid arthritis, unspecified: Secondary | ICD-10-CM | POA: Diagnosis not present

## 2018-03-19 DIAGNOSIS — M5030 Other cervical disc degeneration, unspecified cervical region: Secondary | ICD-10-CM | POA: Diagnosis not present

## 2018-03-20 DIAGNOSIS — M5030 Other cervical disc degeneration, unspecified cervical region: Secondary | ICD-10-CM | POA: Diagnosis not present

## 2018-03-20 DIAGNOSIS — M069 Rheumatoid arthritis, unspecified: Secondary | ICD-10-CM | POA: Diagnosis not present

## 2018-03-20 DIAGNOSIS — M353 Polymyalgia rheumatica: Secondary | ICD-10-CM | POA: Diagnosis not present

## 2018-03-20 DIAGNOSIS — I10 Essential (primary) hypertension: Secondary | ICD-10-CM | POA: Diagnosis not present

## 2018-03-20 DIAGNOSIS — M48061 Spinal stenosis, lumbar region without neurogenic claudication: Secondary | ICD-10-CM | POA: Diagnosis not present

## 2018-03-20 DIAGNOSIS — M316 Other giant cell arteritis: Secondary | ICD-10-CM | POA: Diagnosis not present

## 2018-03-21 ENCOUNTER — Telehealth: Payer: Self-pay | Admitting: Rheumatology

## 2018-03-21 NOTE — Telephone Encounter (Signed)
Patient left a message yesterday, late pm stating she has a lot of swelling in her legs. Per patient she has already seen her PCP, and has some general questions for the nurse. Patient requesting a call back to discuss.

## 2018-03-21 NOTE — Telephone Encounter (Signed)
Patient should be on tapering schedule for  prednisone dose.  She should follow-up as scheduled.  Decreasing the dose of prednisone should be helpful.  She should decrease salt intake and try to be more active.

## 2018-03-21 NOTE — Telephone Encounter (Signed)
Patient advised of recommendations and verbalized understanding.  

## 2018-03-21 NOTE — Telephone Encounter (Signed)
Patient states her legs and her feet were swollen the most the have ever been. Patient states the swelling was so bad that her legs began weeping. Patient states she was put on Lasix. Patient states the Lasix has helped. Patient states at times her legs will still swell. Patient states that her PCP and his PA feel the prednisone may be related to the issues she is having. Please advise.

## 2018-03-22 DIAGNOSIS — M48061 Spinal stenosis, lumbar region without neurogenic claudication: Secondary | ICD-10-CM | POA: Diagnosis not present

## 2018-03-22 DIAGNOSIS — M5030 Other cervical disc degeneration, unspecified cervical region: Secondary | ICD-10-CM | POA: Diagnosis not present

## 2018-03-22 DIAGNOSIS — I10 Essential (primary) hypertension: Secondary | ICD-10-CM | POA: Diagnosis not present

## 2018-03-22 DIAGNOSIS — M316 Other giant cell arteritis: Secondary | ICD-10-CM | POA: Diagnosis not present

## 2018-03-22 DIAGNOSIS — M353 Polymyalgia rheumatica: Secondary | ICD-10-CM | POA: Diagnosis not present

## 2018-03-22 DIAGNOSIS — M069 Rheumatoid arthritis, unspecified: Secondary | ICD-10-CM | POA: Diagnosis not present

## 2018-03-26 DIAGNOSIS — M353 Polymyalgia rheumatica: Secondary | ICD-10-CM | POA: Diagnosis not present

## 2018-03-26 DIAGNOSIS — M316 Other giant cell arteritis: Secondary | ICD-10-CM | POA: Diagnosis not present

## 2018-03-26 DIAGNOSIS — M5030 Other cervical disc degeneration, unspecified cervical region: Secondary | ICD-10-CM | POA: Diagnosis not present

## 2018-03-26 DIAGNOSIS — M069 Rheumatoid arthritis, unspecified: Secondary | ICD-10-CM | POA: Diagnosis not present

## 2018-03-26 DIAGNOSIS — I10 Essential (primary) hypertension: Secondary | ICD-10-CM | POA: Diagnosis not present

## 2018-03-26 DIAGNOSIS — M48061 Spinal stenosis, lumbar region without neurogenic claudication: Secondary | ICD-10-CM | POA: Diagnosis not present

## 2018-03-27 DIAGNOSIS — M48061 Spinal stenosis, lumbar region without neurogenic claudication: Secondary | ICD-10-CM | POA: Diagnosis not present

## 2018-03-27 DIAGNOSIS — M353 Polymyalgia rheumatica: Secondary | ICD-10-CM | POA: Diagnosis not present

## 2018-03-27 DIAGNOSIS — M316 Other giant cell arteritis: Secondary | ICD-10-CM | POA: Diagnosis not present

## 2018-03-27 DIAGNOSIS — M5030 Other cervical disc degeneration, unspecified cervical region: Secondary | ICD-10-CM | POA: Diagnosis not present

## 2018-03-27 DIAGNOSIS — I10 Essential (primary) hypertension: Secondary | ICD-10-CM | POA: Diagnosis not present

## 2018-03-27 DIAGNOSIS — M069 Rheumatoid arthritis, unspecified: Secondary | ICD-10-CM | POA: Diagnosis not present

## 2018-03-28 DIAGNOSIS — M48061 Spinal stenosis, lumbar region without neurogenic claudication: Secondary | ICD-10-CM | POA: Diagnosis not present

## 2018-03-28 DIAGNOSIS — I10 Essential (primary) hypertension: Secondary | ICD-10-CM | POA: Diagnosis not present

## 2018-03-28 DIAGNOSIS — M069 Rheumatoid arthritis, unspecified: Secondary | ICD-10-CM | POA: Diagnosis not present

## 2018-03-28 DIAGNOSIS — M353 Polymyalgia rheumatica: Secondary | ICD-10-CM | POA: Diagnosis not present

## 2018-03-28 DIAGNOSIS — M5030 Other cervical disc degeneration, unspecified cervical region: Secondary | ICD-10-CM | POA: Diagnosis not present

## 2018-03-28 DIAGNOSIS — M316 Other giant cell arteritis: Secondary | ICD-10-CM | POA: Diagnosis not present

## 2018-03-29 ENCOUNTER — Encounter: Payer: Self-pay | Admitting: Obstetrics and Gynecology

## 2018-03-29 ENCOUNTER — Ambulatory Visit (INDEPENDENT_AMBULATORY_CARE_PROVIDER_SITE_OTHER): Payer: Medicare Other | Admitting: Obstetrics and Gynecology

## 2018-03-29 VITALS — BP 120/80 | HR 93 | Ht 65.0 in | Wt 292.0 lb

## 2018-03-29 DIAGNOSIS — K602 Anal fissure, unspecified: Secondary | ICD-10-CM | POA: Diagnosis not present

## 2018-03-29 DIAGNOSIS — M316 Other giant cell arteritis: Secondary | ICD-10-CM | POA: Diagnosis not present

## 2018-03-29 DIAGNOSIS — M5030 Other cervical disc degeneration, unspecified cervical region: Secondary | ICD-10-CM | POA: Diagnosis not present

## 2018-03-29 DIAGNOSIS — M069 Rheumatoid arthritis, unspecified: Secondary | ICD-10-CM | POA: Diagnosis not present

## 2018-03-29 DIAGNOSIS — M48061 Spinal stenosis, lumbar region without neurogenic claudication: Secondary | ICD-10-CM | POA: Diagnosis not present

## 2018-03-29 DIAGNOSIS — I10 Essential (primary) hypertension: Secondary | ICD-10-CM | POA: Diagnosis not present

## 2018-03-29 DIAGNOSIS — R600 Localized edema: Secondary | ICD-10-CM | POA: Diagnosis not present

## 2018-03-29 DIAGNOSIS — R300 Dysuria: Secondary | ICD-10-CM

## 2018-03-29 DIAGNOSIS — M353 Polymyalgia rheumatica: Secondary | ICD-10-CM | POA: Diagnosis not present

## 2018-03-29 NOTE — Progress Notes (Signed)
Patient ID: Nicole [name], female   DOB: [birth year], 72 y.o.   MRN: [id number]    [facility] Visit  @DATE @            Patient name: Nicole [name] MRN [id number]  Date of birth: [birth year]  CC & HPI:  Nicole [name] is a 72 y.o. female presenting today for vaginal spotting and bleeding. Bleeding started 4-5 days ago, with no bleeding today.  Patient denies taking any blood thinners and prior to onset she has not had any bleeding since before D&C in 2015. She also endorses dysuria. Pt was on 40 mg of prednisone and this caused her legs swell. Prednisone was discontinued and she started lasix to help with edema. She had 4 polyps were removed in 2015 and were non-cancerous. She denies fever, chills or any other symptoms or complaints at this time.   ROS:  ROS +vaginal spotting/bleeding +dysuria +bilateral lower leg edema -fever -chills All systems are negative except as noted in the HPI and PMH.   Pertinent History Reviewed:   Reviewed: Significant for D&C Medical         Past Medical History:  Diagnosis Date  . AC (acromioclavicular) joint bone spurs    lt shoulder  . Anemia   . Arthritis   . Carpal tunnel syndrome, bilateral   . Gastroesophageal reflux   . Headache    recent visit to ER @ [facility] for severe headache  . Hypertension   . Lumbar stenosis    Hx of ESIs by Dr. Nelva Bush  . Polyarthralgia   . Polymyalgia ([place])   . Shingles   . Spinal stenosis                               Surgical Hx:    Past Surgical History:  Procedure Laterality Date  . CHOLECYSTECTOMY    . COLONOSCOPY  06/11/2012   Procedure: COLONOSCOPY;  Surgeon: Jamesetta So, MD;  [facility];  Service: Gastroenterology;  Laterality: N/A;  . HYSTEROSCOPY W/D&C N/A 02/25/2014   Procedure: DILATATION AND CURETTAGE /HYSTEROSCOPY;  Surgeon: Florian Buff, MD;  Location[facility];  Service: Gynecology;  Laterality: N/A;  . POLYPECTOMY N/A 02/25/2014   Procedure: POLYPECTOMY;  Surgeon: Florian Buff, MD;  Location[facility];  Service: Gynecology;  Laterality: N/A;  . RESECTION DISTAL CLAVICAL Right 03/26/2015   Procedure: OPEN DISTAL CLAVICAL RESECTION ;  Surgeon: Netta Cedars, MD;  Location: [place];  Service: Orthopedics;  Laterality: Right;   Medications: Reviewed & Updated - see associated section                       Current Outpatient Medications:  .  diclofenac (VOLTAREN) 75 MG EC tablet, Take 75 mg by mouth 2 (two) times daily. , Disp: , Rfl:  .  gabapentin (NEURONTIN) 100 MG capsule, Take 2 capsules (200 mg total) by mouth 3 (three) times daily. [id number], Disp: 540 capsule, Rfl: 3 .  hydroxychloroquine (PLAQUENIL) 200 MG tablet, TAKE 1 TABLET IN THE MORNING AND 1 TABLET IN THE EVENING, MONDAY THROUGH FRIDAY ONLY. NONE ON SATURDAY OR SUNDAY, Disp: 120 tablet, Rfl: 0 .  losartan-hydrochlorothiazide (HYZAAR) 100-25 MG tablet, Take 1 tablet by mouth daily. , Disp: , Rfl:  .  NONFORMULARY OR COMPOUNDED ITEM, Apply 1-2 application topically every 8 (eight) hours as needed (Diclofenac 3%, Lidocaine 5%, Menthol 1%     (  Cream 100 Gm))., Disp: , Rfl:  .  NUCYNTA 50 MG TABS tablet, Take 50 mg by mouth 3 (three) times daily as needed for moderate pain. , Disp: , Rfl:  .  pantoprazole (PROTONIX) 40 MG tablet, Take 40 mg by mouth daily. , Disp: , Rfl:  .  predniSONE (DELTASONE) 10 MG tablet, Take 3.5 tablets daily x2 weeks, then take 3 tablets daily x2 weeks, then take 2.5 tablets daily x2 weeks, then take 2 tablets daily., Disp: 154 tablet, Rfl: 0 .  Vitamin D, Ergocalciferol, (DRISDOL) 50000 units CAPS capsule, Take 1 capsule (50,000 Units total) by mouth every 7 (seven) days., Disp: 12 capsule, Rfl: 0   Social History: Reviewed -  reports that she has never smoked. She has never used smokeless tobacco.  Objective Findings:  Vitals: There were no vitals taken for this visit.  PHYSICAL EXAMINATION General appearance - alert, well appearing, and in no distress, oriented to person, place,  and time and overweight Mental status - alert, oriented to person, place, and time, normal mood, behavior, speech, dress, motor activity, and thought processes, affect appropriate to mood  PELVIC External genitalia - healing folliculitis on left buttocks cheek Vagina - no current bleeding, healthy Cervix - normal Rectal - cracking next to hemorrhoid, anal fissure     Assessment & Plan:   A:  1. Anal Fissure 2.  Unlikely to have GYN post menopausal bleeding P:  1. F/U PRN   By signing my name below, I, Samul Dada, attest that this documentation has been prepared under the direction and in the presence of Jonnie Kind, MD. Electronically Signed: [place]. 03/29/18. 10:08 AM.  I personally performed the services described in this documentation, which was SCRIBED in my presence. The recorded information has been reviewed and considered accurate. It has been edited as necessary during review. Jonnie Kind, MD

## 2018-04-01 DIAGNOSIS — M069 Rheumatoid arthritis, unspecified: Secondary | ICD-10-CM | POA: Diagnosis not present

## 2018-04-01 DIAGNOSIS — M353 Polymyalgia rheumatica: Secondary | ICD-10-CM | POA: Diagnosis not present

## 2018-04-01 DIAGNOSIS — M5030 Other cervical disc degeneration, unspecified cervical region: Secondary | ICD-10-CM | POA: Diagnosis not present

## 2018-04-01 DIAGNOSIS — M316 Other giant cell arteritis: Secondary | ICD-10-CM | POA: Diagnosis not present

## 2018-04-01 DIAGNOSIS — M48061 Spinal stenosis, lumbar region without neurogenic claudication: Secondary | ICD-10-CM | POA: Diagnosis not present

## 2018-04-01 DIAGNOSIS — I10 Essential (primary) hypertension: Secondary | ICD-10-CM | POA: Diagnosis not present

## 2018-04-02 ENCOUNTER — Ambulatory Visit (HOSPITAL_COMMUNITY): Payer: Medicare Other | Attending: Family Medicine

## 2018-04-02 DIAGNOSIS — R609 Edema, unspecified: Secondary | ICD-10-CM

## 2018-04-02 NOTE — Progress Notes (Signed)
Office Visit Note  Patient: Nicole [name]             Date of Birth: 03-14-1946           MRN: [id number]             PCP: Sharilyn Sites, MD Referring: Sharilyn Sites, MD Visit Date: 04/08/2018 Occupation: @GUAROCC @    Subjective:  Generalized pain   History of Present Illness: Nicole [name] is a 72 y.o. female with history of polymyalgia rheumatica, osteoarthritis, and DDD.  She is taking Plaquenil 200 mg 1 tablet in the morning and 1 tablet in evening Monday through Friday. She has been taking prednisone 20 mg daily.  She continues to have lower extremity muscle fatigue and weakness.  She has been having a physical therapist come to her home to work on muscle strengthening. She denies any recent falls. She reports bilateral knee joints occasional "give out."  She denies any knee swelling. She continues to walk with an assistive device.  She denies any tenderness over bilateral temples.  She continues to have intermittent headaches and blurry vision.   She reports she has edema in bilateral lower extremities. She has been making dietary changes and avoiding salt intake. She was restarted on Lasix, and she discontinued to due to experiencing post-menopausal bleeding.  She has several wounds on lower extremities, and she will be seeing wound care tomorrow. She continues to have symptoms of neuropathy and takes Gabapentin 200 mg TID.     Activities of Daily Living:  Patient reports morning stiffness for 1  hour.   Patient Reports nocturnal pain.  Difficulty dressing/grooming: Denies Difficulty climbing stairs: Reports Difficulty getting out of chair: Reports Difficulty using hands for taps, buttons, cutlery, and/or writing: Denies   Review of Systems  Constitutional: Positive for fatigue.  HENT: Positive for mouth dryness. Negative for mouth sores and nose dryness.   Eyes: Negative for pain, visual disturbance and dryness.  Respiratory: Negative for cough, hemoptysis, shortness of breath  and difficulty breathing.   Cardiovascular: Positive for swelling in legs/feet. Negative for chest pain, palpitations and hypertension.  Gastrointestinal: Negative for blood in stool, constipation and diarrhea.  Endocrine: Negative for increased urination.  Genitourinary: Negative for painful urination.  Musculoskeletal: Positive for arthralgias, joint pain, muscle weakness and morning stiffness. Negative for joint swelling, myalgias, muscle tenderness and myalgias.  Skin: Negative for color change, pallor, rash, hair loss, nodules/bumps, skin tightness, ulcers and sensitivity to sunlight.  Allergic/Immunologic: Negative for susceptible to infections.  Neurological: Negative for dizziness, numbness, headaches and weakness.  Hematological: Negative for swollen glands.  Psychiatric/Behavioral: Negative for depressed mood and sleep disturbance. The patient is not nervous/anxious.     PMFS History:  Patient Active Problem List   Diagnosis Date Noted  . On prednisone therapy 05/31/2017  . Positive anti-CCP test 05/31/2017  . Rheumatoid factor positive 05/31/2017  . Memory loss 04/04/2017  . Polymyalgia rheumatica ([place]) 04/04/2017  . High risk medication use 12/07/2016  . DDD (degenerative disc disease), lumbar 09/27/2016  . HTN (hypertension) 06/05/2016  . Chronic pain syndrome 06/05/2016  . Acute diverticulitis 06/02/2016  . Diverticulitis large intestine 06/02/2016  . Spinal stenosis of lumbar region 02/29/2016  . Joint pain 02/29/2016  . Endometrial polyp 02/06/2014  . Postmenopausal bleeding 01/30/2014  . GERD (gastroesophageal reflux disease) 09/29/2013  . Spinal stenosis, thoracic 09/29/2013  . Shingles 09/29/2013  . Thoracic or lumbosacral neuritis or radiculitis, unspecified 05/04/2011  . Abnormality of gait 05/04/2011  . Muscle  weakness (generalized) 05/04/2011  . Bilateral primary osteoarthritis of knee 04/07/2009  . KNEE PAIN 04/07/2009    Past Medical History:    Diagnosis Date  . AC (acromioclavicular) joint bone spurs    lt shoulder  . Anemia   . Arthritis   . Carpal tunnel syndrome, bilateral   . Gastroesophageal reflux   . Headache    recent visit to ER @ [facility] for severe headache  . Hypertension   . Lumbar stenosis    Hx of ESIs by Dr. Nelva Bush  . Polyarthralgia   . Polymyalgia ([place])   . Shingles   . Spinal stenosis     Family History  Problem Relation Age of Onset  . Hypertension Mother   . Pneumonia Father   . Arthritis Sister   . Diabetes Paternal Grandmother    Past Surgical History:  Procedure Laterality Date  . CHOLECYSTECTOMY    . COLONOSCOPY  06/11/2012   Procedure: COLONOSCOPY;  Surgeon: Jamesetta So, MD;  [facility];  Service: Gastroenterology;  Laterality: N/A;  . HYSTEROSCOPY W/D&C N/A 02/25/2014   Procedure: DILATATION AND CURETTAGE /HYSTEROSCOPY;  Surgeon: Florian Buff, MD;  Location: [facility];  Service: Gynecology;  Laterality: N/A;  . POLYPECTOMY N/A 02/25/2014   Procedure: POLYPECTOMY;  Surgeon: Florian Buff, MD;  Location: [facility];  Service: Gynecology;  Laterality: N/A;  . RESECTION DISTAL CLAVICAL Right 03/26/2015   Procedure: OPEN DISTAL CLAVICAL RESECTION ;  Surgeon: Netta Cedars, MD;  Location: [facility];  Service: Orthopedics;  Laterality: Right;   Social History   Social History Narrative   Lives at home alone.   Right-handed.   1-2 cups caffeine daily.     Objective: Vital Signs: BP 111/80 (BP Location: Left Wrist, Patient Position: Sitting, Cuff Size: Normal)   Pulse (!) 107   Resp 19   Ht 5' (1.524 m)   Wt 285 lb (129.3 kg)   BMI 55.66 kg/m    Physical Exam  Constitutional: She is oriented to person, place, and time. She appears well-developed and well-nourished.  HENT:  Head: Normocephalic and atraumatic.  Eyes: Conjunctivae and EOM are normal.  Neck: Normal range of motion.  Cardiovascular: Normal rate, regular rhythm, normal heart sounds and intact distal pulses.   Pulmonary/Chest: Effort normal and breath sounds normal.  Abdominal: Soft. Bowel sounds are normal.  Lymphadenopathy:    She has no cervical adenopathy.  Neurological: She is alert and oriented to person, place, and time.  Skin: Skin is warm and dry. Capillary refill takes less than 2 seconds.  Psychiatric: She has a normal mood and affect. Her behavior is normal.  Nursing note and vitals reviewed.    Musculoskeletal Exam: C-spine limited ROM.  Thoracic kyphosis.  Shoulder joints slightly limited abduction.  Elbow joints good ROM.  Wrist joints, MCPs, PIPs, and DIPs good ROM with no synovitis.  Complete fist formation.  Unable to fully assess hip joint ROM while in sitting position.  Knee joints good ROM with no warmth or effusion.  Pedal edema bilaterally.    CDAI Exam: No CDAI exam completed.    Investigation: No additional findings.PLQ eye exam: 12/13/2017 CBC Latest Ref Rng & Units 02/12/2018 01/09/2018 12/18/2017  WBC 3.8 - 10.8 Thousand/uL 10.9(H) 6.1 7.3  Hemoglobin 11.7 - 15.5 g/dL 12.1 11.4(L) 12.2  Hematocrit 35.0 - 45.0 % 36.4 33.9(L) 35.6  Platelets 140 - 400 Thousand/uL 299 290 295   CMP Latest Ref Rng & Units 02/12/2018 01/14/2018 01/09/2018  Glucose 65 - 99 mg/dL 123(H) 89 91  BUN 7 - 25 mg/dL 40(H) 32(H) 22  Creatinine 0.60 - 0.93 mg/dL 1.47(H) 1.41(H) 1.51(H)  Sodium 135 - 146 mmol/L 141 141 142  Potassium 3.5 - 5.3 mmol/L 4.8 4.3 4.5  Chloride 98 - 110 mmol/L 106 105 106  CO2 20 - 32 mmol/L 29 28 29   Calcium 8.6 - 10.4 mg/dL 9.1 8.8 9.1  Total Protein 6.1 - 8.1 g/dL 5.9(L) 6.6 6.4  Total Bilirubin 0.2 - 1.2 mg/dL 0.4 - 0.4  Alkaline Phos 39 - 117 IU/L - - -  AST 10 - 35 U/L 19 - 17  ALT 6 - 29 U/L 43(H) - 15    Imaging: No results found.  Speciality Comments: PLQ Eye Exam: 12/13/17 WNl @ [facility]    Procedures:  No procedures performed Allergies: Oxycontin [oxycodone hcl]; Sulfa antibiotics; Tramadol; Penicillins; and Sulfasalazine   Assessment /  Plan:     Visit Diagnoses: Polymyalgia rheumatica ([place]): She clinically continues to improve.  She has not home physical therapist coming to work on muscle strengthening.  She continues to walk with an assistive device and has not had any recent falls.  She is currently on prednisone 20 mg daily.  We will start tapering her prednisone by 2.5 mg every 2 weeks.  When she is on prednisone 10 mg we will continue on this dose.  She will return in 3 months.  She is advised to notify us if she develops worsening symptoms.  Temporal arteritis ([place]): No tenderness in the temporal artery region.  She has intermittent headaches and blurry vision occasionally.  She refused to get a temporal artery biopsy previously.  On prednisone therapy: She is currently on prednisone 20 mg daily.  She will start tapering her prednisone by 2.5 mg every 2 weeks.  A refill of prednisone was sent to the pharmacy.  She was advised of the instructions of the taper.  Once she is off prednisone 10 mg she will continue taking 10 mg daily.  We will see her back in 3 months.  High risk medication use - PLQ.eye exam: 12/13/2017.  She is on Plaquenil 200 mg twice daily Monday through Friday.  She will have lab work drawn at her next visit.  Rheumatoid factor positive: No synovitis noted.  She has no clinical features of RA.  No tenderness of MCPs.  Positive anti-CCP test: No synovitis noted.   Chronic pain syndrome: She is on Nucynta 50 mg TID PRN for pain relief.    Primary osteoarthritis of both knees: She has good ROM with no warmth or effusion.  She gives a history of bilateral knee joints intermittently "giving out" when she is walking.  She has not had any recent falls.  She walks with an assistive device.   DDD (degenerative disc disease), lumbar: Chronic pain.   Spinal stenosis, thoracic: Chronic pain.    Other medical conditions are listed as follows:   History of diverticulitis  History of hypertension  History of  gastroesophageal reflux (GERD)  Memory loss  Vitamin D deficiency -She finished taking vitamin D 50,000 units once weekly on Wednesday.  Vitamin D level was checked today.  Plan: VITAMIN D 25 Hydroxy (Vit-D Deficiency, Fractures)    Orders: Orders Placed This Encounter  Procedures  . VITAMIN D 25 Hydroxy (Vit-D Deficiency, Fractures)   No orders of the defined types were placed in this encounter.   Face-to-face time spent with patient was 30 minutes.  50% of time was spent in counseling and coordination of care.  Follow-Up Instructions: Return in about 3 months (around 07/09/2018) for Polymyalgia Rheumatica, Osteoarthritis, DDD.   Ofilia Neas, PA-C   I examined and evaluated the patient with Hazel Sams PA.  Patient had no temporal artery tenderness on examination today.  She does feel some generalized weakness but does not have discomfort as she was having in the beginning with polymyalgia rheumatica.  We will continue to taper prednisone by 2.5 mg every 2 weeks.  The plan of care was discussed as noted above.  Bo Merino, MD  Note - This record has been created using Editor, commissioning.  Chart creation errors have been sought, but may not always  have been located. Such creation errors do not reflect on  the standard of medical care.

## 2018-04-02 NOTE — Therapy (Signed)
[place], [place], [address] Phone: [phone]   Fax:  [phone]  Patient Details  Name: Nicole [name] MRN: [id number] Date of Birth: 02-08-1946 Referring Provider:  Forde Radon  Encounter Date: 04/02/2018   I spoke with Alger Memos face to face discussing her current wound care goals and current treatments. She informed me that she is currently being seen by [facility] and physical therapy and recently finished treatment with home health occupational therapy. I educated her that she can not receive outpatient services for wound care while receiving home health wound care and that if she would prefer to be treated at home we can not continue with the evaluation today. She stated that she would prefer home care due to the difficulty she has mobilizing and that her instructions were to be evaluated at a wound clinic so that the physician can send orders to her Home RN for how to treat the wound. I educated Ms. Volpi that this is a rehab office with physical therapists who treat ambulatory patients who have wounds but that I can not provide an order for her home health nurse on how to treat the wound. I educated her that she can be [place] at [facility] where she can be evaluated by an MD and follow-up with him while receiving home health wound care. She agreed this would be a better option for her. I then called her PCP's office, Dr. Sharilyn Sites, and left a message with his front office informing him that the patient will need an urgent referral/consultation with the [facility] in [place] so that the MD can send orders/recommendations to her [place]. On gross observation this date Ms. Dia has a covered wound on her Rt and Lt LE and bil LE's have severe pitting edema of 4+.    Kipp Brood, PT, DPT Physical Therapist with [facility]  04/02/2018 1:05 PM    [place], [place], [address] Phone: [phone]   Fax:  [phone]

## 2018-04-03 DIAGNOSIS — M48061 Spinal stenosis, lumbar region without neurogenic claudication: Secondary | ICD-10-CM | POA: Diagnosis not present

## 2018-04-03 DIAGNOSIS — M353 Polymyalgia rheumatica: Secondary | ICD-10-CM | POA: Diagnosis not present

## 2018-04-03 DIAGNOSIS — M316 Other giant cell arteritis: Secondary | ICD-10-CM | POA: Diagnosis not present

## 2018-04-03 DIAGNOSIS — M069 Rheumatoid arthritis, unspecified: Secondary | ICD-10-CM | POA: Diagnosis not present

## 2018-04-03 DIAGNOSIS — M5030 Other cervical disc degeneration, unspecified cervical region: Secondary | ICD-10-CM | POA: Diagnosis not present

## 2018-04-03 DIAGNOSIS — I10 Essential (primary) hypertension: Secondary | ICD-10-CM | POA: Diagnosis not present

## 2018-04-06 DIAGNOSIS — M316 Other giant cell arteritis: Secondary | ICD-10-CM | POA: Diagnosis not present

## 2018-04-06 DIAGNOSIS — I10 Essential (primary) hypertension: Secondary | ICD-10-CM | POA: Diagnosis not present

## 2018-04-06 DIAGNOSIS — M5030 Other cervical disc degeneration, unspecified cervical region: Secondary | ICD-10-CM | POA: Diagnosis not present

## 2018-04-06 DIAGNOSIS — M48061 Spinal stenosis, lumbar region without neurogenic claudication: Secondary | ICD-10-CM | POA: Diagnosis not present

## 2018-04-06 DIAGNOSIS — M353 Polymyalgia rheumatica: Secondary | ICD-10-CM | POA: Diagnosis not present

## 2018-04-06 DIAGNOSIS — M069 Rheumatoid arthritis, unspecified: Secondary | ICD-10-CM | POA: Diagnosis not present

## 2018-04-08 ENCOUNTER — Encounter: Payer: Self-pay | Admitting: Rheumatology

## 2018-04-08 ENCOUNTER — Ambulatory Visit (INDEPENDENT_AMBULATORY_CARE_PROVIDER_SITE_OTHER): Payer: Medicare Other | Admitting: Rheumatology

## 2018-04-08 VITALS — BP 111/80 | HR 107 | Resp 19 | Ht 60.0 in | Wt 285.0 lb

## 2018-04-08 DIAGNOSIS — Z8719 Personal history of other diseases of the digestive system: Secondary | ICD-10-CM

## 2018-04-08 DIAGNOSIS — R768 Other specified abnormal immunological findings in serum: Secondary | ICD-10-CM | POA: Diagnosis not present

## 2018-04-08 DIAGNOSIS — M170 Bilateral primary osteoarthritis of knee: Secondary | ICD-10-CM

## 2018-04-08 DIAGNOSIS — Z79899 Other long term (current) drug therapy: Secondary | ICD-10-CM | POA: Diagnosis not present

## 2018-04-08 DIAGNOSIS — R413 Other amnesia: Secondary | ICD-10-CM

## 2018-04-08 DIAGNOSIS — M4804 Spinal stenosis, thoracic region: Secondary | ICD-10-CM

## 2018-04-08 DIAGNOSIS — G894 Chronic pain syndrome: Secondary | ICD-10-CM

## 2018-04-08 DIAGNOSIS — Z7952 Long term (current) use of systemic steroids: Secondary | ICD-10-CM

## 2018-04-08 DIAGNOSIS — Z8679 Personal history of other diseases of the circulatory system: Secondary | ICD-10-CM | POA: Diagnosis not present

## 2018-04-08 DIAGNOSIS — M316 Other giant cell arteritis: Secondary | ICD-10-CM | POA: Diagnosis not present

## 2018-04-08 DIAGNOSIS — M069 Rheumatoid arthritis, unspecified: Secondary | ICD-10-CM | POA: Diagnosis not present

## 2018-04-08 DIAGNOSIS — M5030 Other cervical disc degeneration, unspecified cervical region: Secondary | ICD-10-CM | POA: Diagnosis not present

## 2018-04-08 DIAGNOSIS — E559 Vitamin D deficiency, unspecified: Secondary | ICD-10-CM

## 2018-04-08 DIAGNOSIS — M5136 Other intervertebral disc degeneration, lumbar region: Secondary | ICD-10-CM

## 2018-04-08 DIAGNOSIS — M353 Polymyalgia rheumatica: Secondary | ICD-10-CM | POA: Diagnosis not present

## 2018-04-08 DIAGNOSIS — M48061 Spinal stenosis, lumbar region without neurogenic claudication: Secondary | ICD-10-CM | POA: Diagnosis not present

## 2018-04-08 DIAGNOSIS — I10 Essential (primary) hypertension: Secondary | ICD-10-CM | POA: Diagnosis not present

## 2018-04-08 MED ORDER — PREDNISONE 5 MG PO TABS: ORAL_TABLET | ORAL | 0 refills | Status: DC

## 2018-04-08 NOTE — Patient Instructions (Signed)
Taper by 2.5 mg of prednisone every 2 weeks

## 2018-04-08 NOTE — Addendum Note (Signed)
Addended by: Carole Binning on: 04/08/2018 05:05 PM   Modules accepted: Orders

## 2018-04-09 DIAGNOSIS — Z8249 Family history of ischemic heart disease and other diseases of the circulatory system: Secondary | ICD-10-CM | POA: Diagnosis not present

## 2018-04-09 DIAGNOSIS — I10 Essential (primary) hypertension: Secondary | ICD-10-CM | POA: Diagnosis not present

## 2018-04-09 DIAGNOSIS — Z79899 Other long term (current) drug therapy: Secondary | ICD-10-CM | POA: Diagnosis not present

## 2018-04-09 DIAGNOSIS — L97918 Non-pressure chronic ulcer of unspecified part of right lower leg with other specified severity: Secondary | ICD-10-CM | POA: Diagnosis not present

## 2018-04-09 DIAGNOSIS — L97928 Non-pressure chronic ulcer of unspecified part of left lower leg with other specified severity: Secondary | ICD-10-CM | POA: Diagnosis not present

## 2018-04-09 DIAGNOSIS — R609 Edema, unspecified: Secondary | ICD-10-CM | POA: Diagnosis not present

## 2018-04-09 DIAGNOSIS — M069 Rheumatoid arthritis, unspecified: Secondary | ICD-10-CM | POA: Diagnosis not present

## 2018-04-09 LAB — VITAMIN D 25 HYDROXY (VIT D DEFICIENCY, FRACTURES): Vit D, 25-Hydroxy: 31 ng/mL (ref 30–100)

## 2018-04-09 NOTE — Progress Notes (Signed)
Vitamin D is 31.  Please advise patient to stay on a maintenance dose of vitamin D.

## 2018-04-10 DIAGNOSIS — M5030 Other cervical disc degeneration, unspecified cervical region: Secondary | ICD-10-CM | POA: Diagnosis not present

## 2018-04-10 DIAGNOSIS — M48061 Spinal stenosis, lumbar region without neurogenic claudication: Secondary | ICD-10-CM | POA: Diagnosis not present

## 2018-04-10 DIAGNOSIS — M316 Other giant cell arteritis: Secondary | ICD-10-CM | POA: Diagnosis not present

## 2018-04-10 DIAGNOSIS — I10 Essential (primary) hypertension: Secondary | ICD-10-CM | POA: Diagnosis not present

## 2018-04-10 DIAGNOSIS — M069 Rheumatoid arthritis, unspecified: Secondary | ICD-10-CM | POA: Diagnosis not present

## 2018-04-10 DIAGNOSIS — M353 Polymyalgia rheumatica: Secondary | ICD-10-CM | POA: Diagnosis not present

## 2018-04-11 DIAGNOSIS — M069 Rheumatoid arthritis, unspecified: Secondary | ICD-10-CM | POA: Diagnosis not present

## 2018-04-11 DIAGNOSIS — M353 Polymyalgia rheumatica: Secondary | ICD-10-CM | POA: Diagnosis not present

## 2018-04-11 DIAGNOSIS — M5030 Other cervical disc degeneration, unspecified cervical region: Secondary | ICD-10-CM | POA: Diagnosis not present

## 2018-04-11 DIAGNOSIS — M48061 Spinal stenosis, lumbar region without neurogenic claudication: Secondary | ICD-10-CM | POA: Diagnosis not present

## 2018-04-11 DIAGNOSIS — M316 Other giant cell arteritis: Secondary | ICD-10-CM | POA: Diagnosis not present

## 2018-04-11 DIAGNOSIS — I10 Essential (primary) hypertension: Secondary | ICD-10-CM | POA: Diagnosis not present

## 2018-04-16 ENCOUNTER — Emergency Department (HOSPITAL_COMMUNITY): Payer: Medicare Other

## 2018-04-16 ENCOUNTER — Emergency Department (HOSPITAL_COMMUNITY)
Admission: EM | Admit: 2018-04-16 | Discharge: 2018-04-16 | Disposition: A | Discharge status: Home / Self Care | Payer: Medicare Other | Attending: Emergency Medicine | Admitting: Emergency Medicine

## 2018-04-16 ENCOUNTER — Other Ambulatory Visit: Payer: Self-pay

## 2018-04-16 ENCOUNTER — Ambulatory Visit: Payer: Medicare Other | Admitting: Rheumatology

## 2018-04-16 DIAGNOSIS — R2243 Localized swelling, mass and lump, lower limb, bilateral: Secondary | ICD-10-CM | POA: Insufficient documentation

## 2018-04-16 DIAGNOSIS — J811 Chronic pulmonary edema: Secondary | ICD-10-CM | POA: Diagnosis not present

## 2018-04-16 DIAGNOSIS — M5030 Other cervical disc degeneration, unspecified cervical region: Secondary | ICD-10-CM | POA: Diagnosis not present

## 2018-04-16 DIAGNOSIS — R600 Localized edema: Secondary | ICD-10-CM

## 2018-04-16 DIAGNOSIS — M48061 Spinal stenosis, lumbar region without neurogenic claudication: Secondary | ICD-10-CM | POA: Diagnosis not present

## 2018-04-16 DIAGNOSIS — M316 Other giant cell arteritis: Secondary | ICD-10-CM | POA: Diagnosis not present

## 2018-04-16 DIAGNOSIS — M353 Polymyalgia rheumatica: Secondary | ICD-10-CM | POA: Diagnosis not present

## 2018-04-16 DIAGNOSIS — I10 Essential (primary) hypertension: Secondary | ICD-10-CM | POA: Diagnosis not present

## 2018-04-16 DIAGNOSIS — M069 Rheumatoid arthritis, unspecified: Secondary | ICD-10-CM | POA: Diagnosis not present

## 2018-04-16 DIAGNOSIS — Z79899 Other long term (current) drug therapy: Secondary | ICD-10-CM | POA: Insufficient documentation

## 2018-04-16 DIAGNOSIS — R42 Dizziness and giddiness: Secondary | ICD-10-CM | POA: Diagnosis present

## 2018-04-16 LAB — BASIC METABOLIC PANEL
Anion gap: 8 (ref 5–15)
BUN: 29 mg/dL — ABNORMAL HIGH (ref 8–23)
CO2: 28 mmol/L (ref 22–32)
Calcium: 9.2 mg/dL (ref 8.9–10.3)
Chloride: 104 mmol/L (ref 98–111)
Creatinine, Ser: 1.38 mg/dL — ABNORMAL HIGH (ref 0.44–1.00)
GFR calc Af Amer: 43 mL/min — ABNORMAL LOW (ref >60)
GFR calc non Af Amer: 37 mL/min — ABNORMAL LOW (ref >60)
Glucose, Bld: 116 mg/dL — ABNORMAL HIGH (ref 70–99)
Potassium: 4.3 mmol/L (ref 3.5–5.1)
Sodium: 140 mmol/L (ref 135–145)

## 2018-04-16 LAB — URINALYSIS, ROUTINE W REFLEX MICROSCOPIC
Bilirubin Urine: NEGATIVE
Glucose, UA: NEGATIVE mg/dL
Hgb urine dipstick: NEGATIVE
Ketones, ur: NEGATIVE mg/dL
Leukocytes, UA: NEGATIVE
Nitrite: NEGATIVE
Protein, ur: NEGATIVE mg/dL
Specific Gravity, Urine: 1.013 (ref 1.005–1.030)
pH: 5 (ref 5.0–8.0)

## 2018-04-16 LAB — CBC
HCT: 36.9 % (ref 36.0–46.0)
Hemoglobin: 11.8 g/dL — ABNORMAL LOW (ref 12.0–15.0)
MCH: 29.5 pg (ref 26.0–34.0)
MCHC: 32 g/dL (ref 30.0–36.0)
MCV: 92.3 fL (ref 78.0–100.0)
Platelets: 279 10*3/uL (ref 150–400)
RBC: 4 MIL/uL (ref 3.87–5.11)
RDW: 15.7 % — ABNORMAL HIGH (ref 11.5–15.5)
WBC: 9.6 10*3/uL (ref 4.0–10.5)

## 2018-04-16 LAB — CBG MONITORING, ED: Glucose-Capillary: 100 mg/dL — ABNORMAL HIGH (ref 70–99)

## 2018-04-16 MED ORDER — FUROSEMIDE 20 MG PO TABS: 20.0000 mg | ORAL_TABLET | Freq: Every day | ORAL | 0 refills | Status: DC

## 2018-04-16 MED ORDER — FUROSEMIDE 10 MG/ML IJ SOLN
40.0000 mg | Freq: Once | INTRAMUSCULAR | Status: AC
  Administered 2018-04-16: 40 mg via INTRAVENOUS
  Filled 2018-04-16: qty 4

## 2018-04-16 NOTE — ED Provider Notes (Signed)
[facility] EMERGENCY DEPARTMENT Provider Note   CSN: [id number] Arrival date & time: 04/16/18  1036     History   Chief Complaint Chief Complaint  Patient presents with  . Nausea  . Loss of Consciousness    HPI Nicole [name] is a 72 y.o. female.  Patient with episode nausea/vomiting earlier today, states was transiently lightheaded/dizzy, and also c/o bilateral foot/ankle swelling, increased above her baseline swelling. Denies orthopnea/pnd. No chest pain or discomfort. Pt currently denies any dizziness, lightheadedness, or room spinning. Pt states compliant w home meds, no new meds, does indicate lasix 20 mg was recently held.  No trauma or fall. No syncope. No fever or chills.    Loss of Consciousness   Associated symptoms include dizziness and light-headedness. Pertinent negatives include abdominal pain, chest pain, confusion, fever, vomiting and weakness.    Past Medical History:  Diagnosis Date  . AC (acromioclavicular) joint bone spurs    lt shoulder  . Anemia   . Arthritis   . Carpal tunnel syndrome, bilateral   . Gastroesophageal reflux   . Headache    recent visit to ER @ [facility] for severe headache  . Hypertension   . Lumbar stenosis    Hx of ESIs by Dr. Nelva Bush  . Polyarthralgia   . Polymyalgia ([place])   . Shingles   . Spinal stenosis     Patient Active Problem List   Diagnosis Date Noted  . On prednisone therapy 05/31/2017  . Positive anti-CCP test 05/31/2017  . Rheumatoid factor positive 05/31/2017  . Memory loss 04/04/2017  . Polymyalgia rheumatica ([place]) 04/04/2017  . High risk medication use 12/07/2016  . DDD (degenerative disc disease), lumbar 09/27/2016  . HTN (hypertension) 06/05/2016  . Chronic pain syndrome 06/05/2016  . Acute diverticulitis 06/02/2016  . Diverticulitis large intestine 06/02/2016  . Spinal stenosis of lumbar region 02/29/2016  . Joint pain 02/29/2016  . Endometrial polyp 02/06/2014  . Postmenopausal bleeding 01/30/2014    . GERD (gastroesophageal reflux disease) 09/29/2013  . Spinal stenosis, thoracic 09/29/2013  . Shingles 09/29/2013  . Thoracic or lumbosacral neuritis or radiculitis, unspecified 05/04/2011  . Abnormality of gait 05/04/2011  . Muscle weakness (generalized) 05/04/2011  . Bilateral primary osteoarthritis of knee 04/07/2009  . KNEE PAIN 04/07/2009    Past Surgical History:  Procedure Laterality Date  . CHOLECYSTECTOMY    . COLONOSCOPY  06/11/2012   Procedure: COLONOSCOPY;  Surgeon: Jamesetta So, MD;  [facility];  Service: Gastroenterology;  Laterality: N/A;  . HYSTEROSCOPY W/D&C N/A 02/25/2014   Procedure: DILATATION AND CURETTAGE /HYSTEROSCOPY;  Surgeon: Florian Buff, MD;  Location: [facility];  Service: Gynecology;  Laterality: N/A;  . POLYPECTOMY N/A 02/25/2014   Procedure: POLYPECTOMY;  Surgeon: Florian Buff, MD;  Location: [facility];  Service: Gynecology;  Laterality: N/A;  . RESECTION DISTAL CLAVICAL Right 03/26/2015   Procedure: OPEN DISTAL CLAVICAL RESECTION ;  Surgeon: Netta Cedars, MD;  Location: [facility];  Service: Orthopedics;  Laterality: Right;     OB History    Gravida  2   Para      Term      Preterm      AB      Living  2     SAB      TAB      Ectopic      Multiple      Live Births  Home Medications    Prior to Admission medications   Medication Sig Start Date End Date Taking? Authorizing Provider  diclofenac (VOLTAREN) 75 MG EC tablet Take 75 mg by mouth 2 (two) times daily.  11/17/16   [provider]  gabapentin (NEURONTIN) 100 MG capsule Take 2 capsules (200 mg total) by mouth 3 (three) times daily. [id number] 11/02/17   Marcial Pacas, MD  hydroxychloroquine (PLAQUENIL) 200 MG tablet TAKE 1 TABLET IN THE MORNING AND 1 TABLET IN THE EVENING, MONDAY THROUGH FRIDAY ONLY. NONE ON SATURDAY OR SUNDAY 03/13/18   Bo Merino, MD  losartan-hydrochlorothiazide (HYZAAR) 100-25 MG tablet Take 1 tablet by mouth daily.  06/01/17    [provider]  NONFORMULARY OR COMPOUNDED ITEM Apply 1-2 application topically every 8 (eight) hours as needed (Diclofenac 3%, Lidocaine 5%, Menthol 1%     (Cream 100 Gm)).    [provider]  NUCYNTA 50 MG TABS tablet Take 50 mg by mouth 3 (three) times daily as needed for moderate pain.  02/14/16   [provider]  pantoprazole (PROTONIX) 40 MG tablet Take 40 mg by mouth daily.  08/20/13   Milton Ferguson, MD  potassium chloride SA (K-DUR,KLOR-CON) 20 MEQ tablet daily. 03/05/18   [provider]  predniSONE (DELTASONE) 10 MG tablet Take 3.5 tablets daily x2 weeks, then take 3 tablets daily x2 weeks, then take 2.5 tablets daily x2 weeks, then take 2 tablets daily. 02/12/18   Bo Merino, MD  predniSONE (DELTASONE) 5 MG tablet Take 3.5 tabs x 2 wks., 3 x tabs x 2 wks., 2.5 tabs x 2 wks., 2 tabs daily 04/08/18   Bo Merino, MD    Family History Family History  Problem Relation Age of Onset  . Hypertension Mother   . Pneumonia Father   . Arthritis Sister   . Diabetes Paternal Grandmother     Social History Social History   Tobacco Use  . Smoking status: Never Smoker  . Smokeless tobacco: Never Used  Substance Use Topics  . Alcohol use: No  . Drug use: No     Allergies   Oxycontin [oxycodone hcl]; Sulfa antibiotics; Tramadol; Penicillins; and Sulfasalazine   Review of Systems Review of Systems  Constitutional: Negative for chills and fever.  HENT: Negative for sore throat.   Eyes: Negative for visual disturbance.  Respiratory: Negative for shortness of breath.   Cardiovascular: Positive for syncope. Negative for chest pain.  Gastrointestinal: Negative for abdominal pain, diarrhea and vomiting.  Genitourinary: Negative for dysuria and flank pain.  Musculoskeletal: Negative for neck pain.  Skin: Negative for rash.  Neurological: Positive for dizziness and light-headedness. Negative for weakness and numbness.  Hematological: Does  not bruise/bleed easily.  Psychiatric/Behavioral: Negative for confusion.     Physical Exam Updated Vital Signs BP 121/85 (BP Location: Right Arm)   Pulse (!) 101   Temp 97.9 F (36.6 C) (Oral)   Resp 20   Ht 1.524 m (5')   Wt 129.3 kg (285 lb)   SpO2 99%   BMI 55.66 kg/m   Physical Exam  Constitutional: She appears well-developed and well-nourished.  HENT:  Head: Atraumatic.  Eyes: Pupils are equal, round, and reactive to light. Conjunctivae are normal. No scleral icterus.  Neck: Neck supple. No tracheal deviation present. No thyromegaly present.  No bruits.   Cardiovascular: Normal rate, regular rhythm, normal heart sounds and intact distal pulses. Exam reveals no gallop and no friction rub.  No murmur heard. Pulmonary/Chest: Effort normal and breath  sounds normal. No respiratory distress.  Abdominal: Soft. Normal appearance and bowel sounds are normal. She exhibits no distension. There is no tenderness.  Genitourinary:  Genitourinary Comments: No cva tenderness  Musculoskeletal:  Bilateral foot/ankle/lower leg edema, symmetric.   Neurological: She is alert.  Skin: Skin is warm and dry. No rash noted. She is not diaphoretic.  Psychiatric: She has a normal mood and affect.  Nursing note and vitals reviewed.    ED Treatments / Results  Labs (all labs ordered are listed, but only abnormal results are displayed) Results for orders placed or performed during the hospital encounter of 22/97/98  Basic metabolic panel  Result Value Ref Range   Sodium 140 135 - 145 mmol/L   Potassium 4.3 3.5 - 5.1 mmol/L   Chloride 104 98 - 111 mmol/L   CO2 28 22 - 32 mmol/L   Glucose, Bld 116 (H) 70 - 99 mg/dL   BUN 29 (H) 8 - 23 mg/dL   Creatinine, Ser 1.38 (H) 0.44 - 1.00 mg/dL   Calcium 9.2 8.9 - 10.3 mg/dL   GFR calc non Af Amer 37 (L) >60 mL/min   GFR calc Af Amer 43 (L) >60 mL/min   Anion gap 8 5 - 15  CBC  Result Value Ref Range   WBC 9.6 4.0 - 10.5 K/uL   RBC 4.00 3.87 -  5.11 MIL/uL   Hemoglobin 11.8 (L) 12.0 - 15.0 g/dL   HCT 36.9 36.0 - 46.0 %   MCV 92.3 78.0 - 100.0 fL   MCH 29.5 26.0 - 34.0 pg   MCHC 32.0 30.0 - 36.0 g/dL   RDW 15.7 (H) 11.5 - 15.5 %   Platelets 279 150 - 400 K/uL  Urinalysis, Routine w reflex microscopic  Result Value Ref Range   Color, Urine YELLOW YELLOW   APPearance CLEAR CLEAR   Specific Gravity, Urine 1.013 1.005 - 1.030   pH 5.0 5.0 - 8.0   Glucose, UA NEGATIVE NEGATIVE mg/dL   Hgb urine dipstick NEGATIVE NEGATIVE   Bilirubin Urine NEGATIVE NEGATIVE   Ketones, ur NEGATIVE NEGATIVE mg/dL   Protein, ur NEGATIVE NEGATIVE mg/dL   Nitrite NEGATIVE NEGATIVE   Leukocytes, UA NEGATIVE NEGATIVE  CBG monitoring, ED  Result Value Ref Range   Glucose-Capillary 100 (H) 70 - 99 mg/dL    EKG EKG Interpretation  Date/Time:  Tuesday April 16 2018 10:41:55 EDT Ventricular Rate:  96 PR Interval:  160 QRS Duration: 90 QT Interval:  344 QTC Calculation: 434 R Axis:   -35 Text Interpretation:  Normal sinus rhythm Possible Left atrial enlargement Left axis deviation Non-specific ST-t changes Confirmed by Lajean Saver [phone]) on 04/16/2018 10:51:13 AM   Radiology Dg Chest 2 View  Result Date: 04/16/2018 CLINICAL DATA:  Soft tissue swelling. EXAM: CHEST - 2 VIEW COMPARISON:  Chest x-ray 12/31/2013. FINDINGS: Cardiomegaly with bilateral from interstitial prominence suggesting CHF. No pleural effusion or pneumothorax. No acute bony abnormality. IMPRESSION: Cardiomegaly with mild bilateral pulmonary interstitial prominence suggesting interstitial edema/CHF. Electronically Signed   By: Marcello Moores  Register   On: 04/16/2018 14:22    Procedures Procedures (including critical care time)  Medications Ordered in ED Medications - No data to display   Initial Impression / Assessment and Plan / ED Course  I have reviewed the triage vital signs and the nursing notes.  Pertinent labs & imaging results that were available during my care of the  patient were reviewed by me and considered in my medical decision making (see chart  for details).  Labs sent.  Elevate legs.   Reviewed nursing notes and prior charts for additional history.   Pt denies any faintness/dizziness in ED.   Labs reviewed - lytes normal.  Suspect increased edema due to recent change meds/lasix held. Vascular congestion on cxr. Lasix iv.   Recheck, no return of dizziness, or nausea. Pt denies pain. Is breathing comfortably with room air pulse ox 99%. Is afebrile.   Rec close outpt pcp f/u. Return precautions provided.   Final Clinical Impressions(s) / ED Diagnoses   Final diagnoses:  None    ED Discharge Orders    None       Lajean Saver, MD 04/16/18 1433

## 2018-04-16 NOTE — Discharge Instructions (Addendum)
It was our pleasure to provide your ER care today - we hope that you feel better.  Take lasix as prescribed. Limit salt intake. Elevate legs.   Follow up with your primary care doctor in the next 1-2 days for recheck.  Also follow up with cardiologist in the next 1-2 weeks - call office to arrange appointment.   Return to ER if worse, new symptoms, fevers, weak/fainting, increased trouble breathing, chest pain, other concern.

## 2018-04-16 NOTE — ED Notes (Signed)
Purewick inserted

## 2018-04-16 NOTE — ED Notes (Signed)
Pt ambulated to restroom with 1 staff assist, pt bent over while doing so, complaining of back pain, pain is not new, pt then requested a Egan Berkheimer to use going back to the bed, walked better with Irisha Grandmaison, she states she uses a Alaysia Lightle at home.

## 2018-04-16 NOTE — ED Triage Notes (Signed)
Patient from home. Home health nurse says that patient "fell asleep while talking to her". Patient complains of nausea and some shortness of breath with exertion. Patient states home health nurse states that pt has a lot of edema in her lower extremities and suggested that she get checked out. Patient is alert x 4 in triage.

## 2018-04-17 ENCOUNTER — Encounter: Payer: Self-pay | Admitting: Cardiovascular Disease

## 2018-04-17 ENCOUNTER — Ambulatory Visit (INDEPENDENT_AMBULATORY_CARE_PROVIDER_SITE_OTHER): Payer: Medicare Other | Admitting: Cardiovascular Disease

## 2018-04-17 VITALS — BP 120/84 | HR 99 | Ht 60.0 in | Wt 278.2 lb

## 2018-04-17 DIAGNOSIS — I509 Heart failure, unspecified: Secondary | ICD-10-CM | POA: Diagnosis not present

## 2018-04-17 DIAGNOSIS — M353 Polymyalgia rheumatica: Secondary | ICD-10-CM

## 2018-04-17 DIAGNOSIS — I10 Essential (primary) hypertension: Secondary | ICD-10-CM | POA: Diagnosis not present

## 2018-04-17 DIAGNOSIS — R55 Syncope and collapse: Secondary | ICD-10-CM | POA: Diagnosis not present

## 2018-04-17 DIAGNOSIS — M069 Rheumatoid arthritis, unspecified: Secondary | ICD-10-CM | POA: Diagnosis not present

## 2018-04-17 DIAGNOSIS — M5030 Other cervical disc degeneration, unspecified cervical region: Secondary | ICD-10-CM | POA: Diagnosis not present

## 2018-04-17 DIAGNOSIS — Z79899 Other long term (current) drug therapy: Secondary | ICD-10-CM

## 2018-04-17 DIAGNOSIS — M316 Other giant cell arteritis: Secondary | ICD-10-CM | POA: Diagnosis not present

## 2018-04-17 DIAGNOSIS — Z9289 Personal history of other medical treatment: Secondary | ICD-10-CM

## 2018-04-17 DIAGNOSIS — M48061 Spinal stenosis, lumbar region without neurogenic claudication: Secondary | ICD-10-CM | POA: Diagnosis not present

## 2018-04-17 MED ORDER — POTASSIUM CHLORIDE CRYS ER 20 MEQ PO TBCR: 20.0000 meq | EXTENDED_RELEASE_TABLET | Freq: Every day | ORAL | 3 refills | Status: DC

## 2018-04-17 MED ORDER — FUROSEMIDE 40 MG PO TABS: 40.0000 mg | ORAL_TABLET | Freq: Every day | ORAL | 3 refills | Status: DC

## 2018-04-17 NOTE — Patient Instructions (Signed)
Medication Instructions:  PLEASE TAKE LASIX 40 MG TWO TIMES DAILY FOR 5 DAYS , THEN RESUME @ 40 MG DAILY   INCREASE POTASSIUM TO 20 MEQ TWO TIMES DAILY FOR 5 DAYS, THEN RESUME 20 MG DAILY   Labwork: BMET  FRIDAY  Testing/Procedures: Your physician has requested that you have an echocardiogram. Echocardiography is a painless test that uses sound waves to create images of your heart. It provides your doctor with information about the size and shape of your heart and how well your heart's chambers and valves are working. This procedure takes approximately one hour. There are no restrictions for this procedure.    Follow-Up: Your physician recommends that you schedule a follow-up appointment in: 2-3 WEEKS    Any Other Special Instructions Will Be Listed Below (If Applicable).     If you need a refill on your cardiac medications before your next appointment, please call your pharmacy.

## 2018-04-17 NOTE — Progress Notes (Signed)
CARDIOLOGY CONSULT NOTE  Patient ID: Tonna Palazzi MRN: [id number] DOB/AGE: 72-12-1945 72 y.o.  Admit date: (Not on file) Primary Physician: Sharilyn Sites, MD Referring Physician: Sharilyn Sites, MD  Reason for Consultation: Near syncope, CHF  HPI: Nicole [name] is a 72 y.o. female who is being seen today for the evaluation of near syncope and CHF at the request of Sharilyn Sites, MD.   She was evaluated in the ED yesterday.  I reviewed all relevant documentation, labs, and studies.  She apparently had some nausea and vomiting earlier in the day and was transiently lightheaded and dizzy and had some bilateral foot and ankle swelling.  She denied chest pain and shortness of breath as well as orthopnea and paroxysmal nocturnal dyspnea.  Chest x-ray showed cardiomegaly with mild bilateral pulmonary interstitial prominence suggesting interstitial edema/CHF.  She was given IV Lasix.  I personally reviewed the ECG performed yesterday which showed sinus rhythm, 96 bpm, with no marked ischemic abnormalities.  Past medical history includes polymyalgia rheumatica for which she takes Plaquenil.  She also has diffuse osteoarthritis affecting both knees and also has lumbar spinal stenosis.  She is on prednisone and has been since April.  Yesterday she was sitting at the table reading her Bible and eating breakfast when she suddenly became dizzy and close her eyes.  She then felt nauseous and had a headache.  She denies any frank loss of consciousness.  She denies antecedent chest pain and palpitations.  For the past 4 weeks, she has become more short of breath than usual and her legs have become markedly swollen.  She has bandages on both legs as they are weeping.  She is initially started on Lasix 20 mg daily for about 26 days and then she had some spotting.  Her PCP told her to stop taking Lasix until she saw her gynecologist.  She was off of Lasix for about 7 to 10 days.  She said she has  had exertional dyspnea for years but it is markedly worse in the past 4 weeks.  She said shortness of breath has become worse as her legs have become more swollen.  She denies a history of MI.  She denies orthopnea and paroxysmal nocturnal dyspnea.   Allergies  Allergen Reactions  . Oxycontin [Oxycodone Hcl] Swelling  . Sulfa Antibiotics Other (See Comments)    Sores  . Tramadol     "Makes me feel like I am falling"  . Penicillins Rash  . Sulfasalazine Other (See Comments)    Sores    Current Outpatient Medications  Medication Sig Dispense Refill  . diclofenac (VOLTAREN) 75 MG EC tablet Take 75 mg by mouth 2 (two) times daily.     . furosemide (LASIX) 20 MG tablet Take 1 tablet (20 mg total) by mouth daily. 30 tablet 0  . gabapentin (NEURONTIN) 100 MG capsule Take 2 capsules (200 mg total) by mouth 3 (three) times daily. [id number] 540 capsule 3  . hydroxychloroquine (PLAQUENIL) 200 MG tablet TAKE 1 TABLET IN THE MORNING AND 1 TABLET IN THE EVENING, MONDAY THROUGH FRIDAY ONLY. NONE ON SATURDAY OR SUNDAY 120 tablet 0  . losartan-hydrochlorothiazide (HYZAAR) 100-25 MG tablet Take 1 tablet by mouth daily.     . NONFORMULARY OR COMPOUNDED ITEM Apply 1-2 application topically every 8 (eight) hours as needed (Diclofenac 3%, Lidocaine 5%, Menthol 1%     (Cream 100 Gm)).    . NUCYNTA 50 MG TABS tablet Take 50 mg by  mouth 3 (three) times daily as needed for moderate pain.     . pantoprazole (PROTONIX) 40 MG tablet Take 40 mg by mouth daily.     . potassium chloride SA (K-DUR,KLOR-CON) 20 MEQ tablet Take 20 mEq by mouth daily.     . predniSONE (DELTASONE) 10 MG tablet Take 3.5 tablets daily x2 weeks, then take 3 tablets daily x2 weeks, then take 2.5 tablets daily x2 weeks, then take 2 tablets daily. 154 tablet 0  . predniSONE (DELTASONE) 5 MG tablet Take 3.5 tabs x 2 wks., 3 x tabs x 2 wks., 2.5 tabs x 2 wks., 2 tabs daily 140 tablet 0   No current facility-administered medications for this  visit.     Past Medical History:  Diagnosis Date  . AC (acromioclavicular) joint bone spurs    lt shoulder  . Anemia   . Arthritis   . Carpal tunnel syndrome, bilateral   . Gastroesophageal reflux   . Headache    recent visit to ER @ [facility] for severe headache  . Hypertension   . Lumbar stenosis    Hx of ESIs by Dr. Nelva Bush  . Polyarthralgia   . Polymyalgia ([place])   . Shingles   . Spinal stenosis     Past Surgical History:  Procedure Laterality Date  . CHOLECYSTECTOMY    . COLONOSCOPY  06/11/2012   Procedure: COLONOSCOPY;  Surgeon: Jamesetta So, MD;  [facility];  Service: Gastroenterology;  Laterality: N/A;  . HYSTEROSCOPY W/D&C N/A 02/25/2014   Procedure: DILATATION AND CURETTAGE /HYSTEROSCOPY;  Surgeon: Florian Buff, MD;  Location: [facility];  Service: Gynecology;  Laterality: N/A;  . POLYPECTOMY N/A 02/25/2014   Procedure: POLYPECTOMY;  Surgeon: Florian Buff, MD;  Location: [facility];  Service: Gynecology;  Laterality: N/A;  . RESECTION DISTAL CLAVICAL Right 03/26/2015   Procedure: OPEN DISTAL CLAVICAL RESECTION ;  Surgeon: Netta Cedars, MD;  Location: [place];  Service: Orthopedics;  Laterality: Right;    Social History   Socioeconomic History  . Marital status: Widowed    Spouse name: Not on file  . Number of children: 2  . Years of education: 1 yr coll  . Highest education level: Not on file  Occupational History  . Occupation: Retired  Scientific laboratory technician  . Financial resource strain: Not on file  . Food insecurity:    Worry: Not on file    Inability: Not on file  . Transportation needs:    Medical: Not on file    Non-medical: Not on file  Tobacco Use  . Smoking status: Never Smoker  . Smokeless tobacco: Never Used  Substance and Sexual Activity  . Alcohol use: No  . Drug use: No  . Sexual activity: Yes    Birth control/protection: Post-menopausal  Lifestyle  . Physical activity:    Days per week: Not on file    Minutes per session: Not on file  .  Stress: Not on file  Relationships  . Social connections:    Talks on phone: Not on file    Gets together: Not on file    Attends religious service: Not on file    Active member of club or organization: Not on file    Attends meetings of clubs or organizations: Not on file    Relationship status: Not on file  . Intimate partner violence:    Fear of current or ex partner: Not on file    Emotionally abused: Not on file  Physically abused: Not on file    Forced sexual activity: Not on file  Other Topics Concern  . Not on file  Social History Narrative   Lives at home alone.   Right-handed.   1-2 cups caffeine daily.     No family history of premature CAD in 1st degree relatives.  Current Meds  Medication Sig  . diclofenac (VOLTAREN) 75 MG EC tablet Take 75 mg by mouth 2 (two) times daily.   . furosemide (LASIX) 20 MG tablet Take 1 tablet (20 mg total) by mouth daily.  [name] gabapentin (NEURONTIN) 100 MG capsule Take 2 capsules (200 mg total) by mouth 3 (three) times daily. [id number]  . hydroxychloroquine (PLAQUENIL) 200 MG tablet TAKE 1 TABLET IN THE MORNING AND 1 TABLET IN THE EVENING, MONDAY THROUGH FRIDAY ONLY. NONE ON SATURDAY OR SUNDAY  . losartan-hydrochlorothiazide (HYZAAR) 100-25 MG tablet Take 1 tablet by mouth daily.   . NONFORMULARY OR COMPOUNDED ITEM Apply 1-2 application topically every 8 (eight) hours as needed (Diclofenac 3%, Lidocaine 5%, Menthol 1%     (Cream 100 Gm)).  . NUCYNTA 50 MG TABS tablet Take 50 mg by mouth 3 (three) times daily as needed for moderate pain.   . pantoprazole (PROTONIX) 40 MG tablet Take 40 mg by mouth daily.   . potassium chloride SA (K-DUR,KLOR-CON) 20 MEQ tablet Take 20 mEq by mouth daily.   . predniSONE (DELTASONE) 10 MG tablet Take 3.5 tablets daily x2 weeks, then take 3 tablets daily x2 weeks, then take 2.5 tablets daily x2 weeks, then take 2 tablets daily.  . predniSONE (DELTASONE) 5 MG tablet Take 3.5 tabs x 2 wks., 3 x tabs x 2  wks., 2.5 tabs x 2 wks., 2 tabs daily      Review of systems complete and found to be negative unless listed above in HPI    Physical exam Blood pressure 120/84, pulse 99, height 5' (1.524 m), weight 278 lb 3.2 oz (126.2 kg), SpO2 93 %. General: NAD Neck: No JVD, no thyromegaly or thyroid nodule.  Lungs: Diminished breath sounds throughout with faint bibasilar Rales. CV: Nondisplaced PMI. Regular rate and rhythm, normal S1/S2, no S3/S4, no murmur.  2-3+ pitting bilateral lower extremity edema to the knees.  No carotid bruit.    Abdomen: Soft, nontender, morbidly obese.  Skin: Legs are bandaged, skin is weeping. Neurologic: Alert and oriented x 3.  Psych: Normal affect. Extremities: No clubbing or cyanosis.  HEENT: Normal.   ECG: Most recent ECG reviewed.   Labs: Lab Results  Component Value Date/Time   K 4.3 04/16/2018 11:06 AM   BUN 29 (H) 04/16/2018 11:06 AM   BUN 16 12/18/2017 12:47 PM   CREATININE 1.38 (H) 04/16/2018 11:06 AM   CREATININE 1.47 (H) 02/12/2018 10:10 AM   ALT 43 (H) 02/12/2018 10:10 AM   TSH 3.77 01/09/2018 10:37 AM   HGB 11.8 (L) 04/16/2018 11:06 AM   HGB 12.2 12/18/2017 12:47 PM     Lipids: No results found for: LDLCALC, LDLDIRECT, CHOL, TRIG, HDL      ASSESSMENT AND PLAN:  1.  Acute CHF with bilateral leg edema and exertional dyspnea: I am concerned about left ventricular systolic dysfunction.  Currently on Lasix 20 mg daily.  She is also on losartan and hydrochlorothiazide with potassium supplementation.  I will obtain an echocardiogram to evaluate cardiac structure and function. I will increase Lasix to 40 mg twice daily for 5 days and increase potassium to 20 meq twice daily for  5 days.  I will then reduce Lasix to 40 mg daily and potassium to 20 meq daily.  I will check a basic metabolic panel in 2 days. I believe prednisone use is also contributing to her leg edema but I suspect she has developed left ventricular systolic dysfunction.  2.   Near syncope: This was likely related to acute CHF.  See discussion #1.  3.  Hypertension: Blood pressure is normal.  I will monitor given increased diuretic dosing as noted above.  4.  Polymyalgia rheumatica: Currently on Plaquenil and prednisone.  I believe prednisone is contributing to fluid retention as well.     Disposition: Follow up in 2 to 3 weeks  A high level of decision making was required for increased medical complexities.   Signed: Kate Sable, M.D., F.A.C.C.  04/17/2018, 3:01 PM

## 2018-04-18 DIAGNOSIS — M316 Other giant cell arteritis: Secondary | ICD-10-CM | POA: Diagnosis not present

## 2018-04-18 DIAGNOSIS — M353 Polymyalgia rheumatica: Secondary | ICD-10-CM | POA: Diagnosis not present

## 2018-04-18 DIAGNOSIS — M069 Rheumatoid arthritis, unspecified: Secondary | ICD-10-CM | POA: Diagnosis not present

## 2018-04-18 DIAGNOSIS — I10 Essential (primary) hypertension: Secondary | ICD-10-CM | POA: Diagnosis not present

## 2018-04-18 DIAGNOSIS — M48061 Spinal stenosis, lumbar region without neurogenic claudication: Secondary | ICD-10-CM | POA: Diagnosis not present

## 2018-04-18 DIAGNOSIS — M5030 Other cervical disc degeneration, unspecified cervical region: Secondary | ICD-10-CM | POA: Diagnosis not present

## 2018-04-19 ENCOUNTER — Other Ambulatory Visit (HOSPITAL_COMMUNITY)
Admission: RE | Admit: 2018-04-19 | Discharge: 2018-04-19 | Disposition: A | Discharge status: Home / Self Care | Payer: Medicare Other | Source: Ambulatory Visit | Attending: Cardiovascular Disease | Admitting: Cardiovascular Disease

## 2018-04-19 DIAGNOSIS — Z79899 Other long term (current) drug therapy: Secondary | ICD-10-CM | POA: Diagnosis not present

## 2018-04-19 LAB — BASIC METABOLIC PANEL
Anion gap: 10 (ref 5–15)
BUN: 41 mg/dL — ABNORMAL HIGH (ref 8–23)
CO2: 30 mmol/L (ref 22–32)
Calcium: 9.3 mg/dL (ref 8.9–10.3)
Chloride: 98 mmol/L (ref 98–111)
Creatinine, Ser: 1.98 mg/dL — ABNORMAL HIGH (ref 0.44–1.00)
GFR calc Af Amer: 28 mL/min — ABNORMAL LOW (ref >60)
GFR calc non Af Amer: 24 mL/min — ABNORMAL LOW (ref >60)
Glucose, Bld: 106 mg/dL — ABNORMAL HIGH (ref 70–99)
Potassium: 3.8 mmol/L (ref 3.5–5.1)
Sodium: 138 mmol/L (ref 135–145)

## 2018-04-22 ENCOUNTER — Telehealth: Payer: Self-pay

## 2018-04-22 DIAGNOSIS — M069 Rheumatoid arthritis, unspecified: Secondary | ICD-10-CM | POA: Diagnosis not present

## 2018-04-22 DIAGNOSIS — M353 Polymyalgia rheumatica: Secondary | ICD-10-CM | POA: Diagnosis not present

## 2018-04-22 DIAGNOSIS — M5030 Other cervical disc degeneration, unspecified cervical region: Secondary | ICD-10-CM | POA: Diagnosis not present

## 2018-04-22 DIAGNOSIS — I10 Essential (primary) hypertension: Secondary | ICD-10-CM | POA: Diagnosis not present

## 2018-04-22 DIAGNOSIS — M48061 Spinal stenosis, lumbar region without neurogenic claudication: Secondary | ICD-10-CM | POA: Diagnosis not present

## 2018-04-22 DIAGNOSIS — Z79899 Other long term (current) drug therapy: Secondary | ICD-10-CM

## 2018-04-22 DIAGNOSIS — M316 Other giant cell arteritis: Secondary | ICD-10-CM | POA: Diagnosis not present

## 2018-04-22 NOTE — Telephone Encounter (Signed)
-----   Message from Herminio Commons, MD sent at 04/19/2018  4:49 PM EDT ----- Indicative of recent increase in Lasix dosing.  Please repeat BMET in two weeks.

## 2018-04-22 NOTE — Telephone Encounter (Signed)
Pt notified, mailed lab slip to her

## 2018-04-24 ENCOUNTER — Ambulatory Visit (HOSPITAL_COMMUNITY)
Admission: RE | Admit: 2018-04-24 | Discharge: 2018-04-24 | Disposition: A | Discharge status: Home / Self Care | Payer: Medicare Other | Source: Ambulatory Visit | Attending: Cardiovascular Disease | Admitting: Cardiovascular Disease

## 2018-04-24 ENCOUNTER — Other Ambulatory Visit: Payer: Self-pay | Admitting: *Deleted

## 2018-04-24 ENCOUNTER — Encounter: Payer: Self-pay | Admitting: Obstetrics & Gynecology

## 2018-04-24 ENCOUNTER — Ambulatory Visit (INDEPENDENT_AMBULATORY_CARE_PROVIDER_SITE_OTHER): Payer: Medicare Other | Admitting: Obstetrics & Gynecology

## 2018-04-24 VITALS — BP 100/60 | HR 93 | Wt 270.0 lb

## 2018-04-24 DIAGNOSIS — Z6841 Body Mass Index (BMI) 40.0 and over, adult: Secondary | ICD-10-CM | POA: Insufficient documentation

## 2018-04-24 DIAGNOSIS — Z1212 Encounter for screening for malignant neoplasm of rectum: Secondary | ICD-10-CM

## 2018-04-24 DIAGNOSIS — I509 Heart failure, unspecified: Secondary | ICD-10-CM | POA: Insufficient documentation

## 2018-04-24 DIAGNOSIS — K219 Gastro-esophageal reflux disease without esophagitis: Secondary | ICD-10-CM | POA: Insufficient documentation

## 2018-04-24 DIAGNOSIS — K625 Hemorrhage of anus and rectum: Secondary | ICD-10-CM

## 2018-04-24 DIAGNOSIS — I110 Hypertensive heart disease with heart failure: Secondary | ICD-10-CM | POA: Insufficient documentation

## 2018-04-24 NOTE — Progress Notes (Signed)
Chief Complaint  Patient presents with  . work-in-gyn    vaginal bleeding today      72 y.o. G2P0 No LMP recorded. Patient is postmenopausal. The current method of family planning is post menopausal status.  Outpatient Encounter Medications as of 04/24/2018  Medication Sig  . diclofenac (VOLTAREN) 75 MG EC tablet Take 75 mg by mouth 2 (two) times daily.   . furosemide (LASIX) 40 MG tablet Take 1 tablet (40 mg total) by mouth daily.  [name] gabapentin (NEURONTIN) 100 MG capsule Take 2 capsules (200 mg total) by mouth 3 (three) times daily. [id number]  . hydroxychloroquine (PLAQUENIL) 200 MG tablet TAKE 1 TABLET IN THE MORNING AND 1 TABLET IN THE EVENING, MONDAY THROUGH FRIDAY ONLY. NONE ON SATURDAY OR SUNDAY  . losartan-hydrochlorothiazide (HYZAAR) 100-25 MG tablet Take 1 tablet by mouth daily.   . NONFORMULARY OR COMPOUNDED ITEM Apply 1-2 application topically every 8 (eight) hours as needed (Diclofenac 3%, Lidocaine 5%, Menthol 1%     (Cream 100 Gm)).  . NUCYNTA 50 MG TABS tablet Take 50 mg by mouth 3 (three) times daily as needed for moderate pain.   . pantoprazole (PROTONIX) 40 MG tablet Take 40 mg by mouth daily.   . potassium chloride SA (K-DUR,KLOR-CON) 20 MEQ tablet Take 1 tablet (20 mEq total) by mouth daily.  . predniSONE (DELTASONE) 10 MG tablet Take 3.5 tablets daily x2 weeks, then take 3 tablets daily x2 weeks, then take 2.5 tablets daily x2 weeks, then take 2 tablets daily.  . predniSONE (DELTASONE) 5 MG tablet Take 3.5 tabs x 2 wks., 3 x tabs x 2 wks., 2.5 tabs x 2 wks., 2 tabs daily   No facility-administered encounter medications on file as of 04/24/2018.     Subjective Nicole [name] is back in as a work in She saw Dr Glo Herring on 04/08/2018 for what she thought was vaginal bleeding that had stopped Evaluation was negative for gyn source and Dr Glo Herring was unsure if she had a GI source or not He instructed her to come back if her bleeding returned  She had BRB this am  when she went to bathroom where she voided and had a BM, she is unsure of the source but said it was like a period but stopped after she had her BM Past Medical History:  Diagnosis Date  . AC (acromioclavicular) joint bone spurs    lt shoulder  . Anemia   . Arthritis   . Carpal tunnel syndrome, bilateral   . Gastroesophageal reflux   . Headache    recent visit to ER @ [facility] for severe headache  . Hypertension   . Lumbar stenosis    Hx of ESIs by Dr. Nelva Bush  . Polyarthralgia   . Polymyalgia ([place])   . Shingles   . Spinal stenosis     Past Surgical History:  Procedure Laterality Date  . CHOLECYSTECTOMY    . COLONOSCOPY  06/11/2012   Procedure: COLONOSCOPY;  Surgeon: Jamesetta So, MD;  [facility];  Service: Gastroenterology;  Laterality: N/A;  . HYSTEROSCOPY W/D&C N/A 02/25/2014   Procedure: DILATATION AND CURETTAGE /HYSTEROSCOPY;  Surgeon: Florian Buff, MD;  Location: [facility];  Service: Gynecology;  Laterality: N/A;  . POLYPECTOMY N/A 02/25/2014   Procedure: POLYPECTOMY;  Surgeon: Florian Buff, MD;  Location: [facility];  Service: Gynecology;  Laterality: N/A;  . RESECTION DISTAL CLAVICAL Right 03/26/2015   Procedure: OPEN DISTAL CLAVICAL RESECTION ;  Surgeon: Netta Cedars, MD;  Location: [place];  Service: Orthopedics;  Laterality: Right;    OB History    Gravida  2   Para      Term      Preterm      AB      Living  2     SAB      TAB      Ectopic      Multiple      Live Births              Allergies  Allergen Reactions  . Oxycontin [Oxycodone Hcl] Swelling  . Sulfa Antibiotics Other (See Comments)    Sores  . Tramadol     "Makes me feel like I am falling"  . Penicillins Rash  . Sulfasalazine Other (See Comments)    Sores    Social History   Socioeconomic History  . Marital status: Widowed    Spouse name: Not on file  . Number of children: 2  . Years of education: 1 yr coll  . Highest education level: Not on file  Occupational  History  . Occupation: Retired  Scientific laboratory technician  . Financial resource strain: Not on file  . Food insecurity:    Worry: Not on file    Inability: Not on file  . Transportation needs:    Medical: Not on file    Non-medical: Not on file  Tobacco Use  . Smoking status: Never Smoker  . Smokeless tobacco: Never Used  Substance and Sexual Activity  . Alcohol use: No  . Drug use: No  . Sexual activity: Not Currently    Birth control/protection: Post-menopausal  Lifestyle  . Physical activity:    Days per week: Not on file    Minutes per session: Not on file  . Stress: Not on file  Relationships  . Social connections:    Talks on phone: Not on file    Gets together: Not on file    Attends religious service: Not on file    Active member of club or organization: Not on file    Attends meetings of clubs or organizations: Not on file    Relationship status: Not on file  Other Topics Concern  . Not on file  Social History Narrative   Lives at home alone.   Right-handed.   1-2 cups caffeine daily.    Family History  Problem Relation Age of Onset  . Hypertension Mother   . Pneumonia Father   . Arthritis Sister   . Diabetes Paternal Grandmother     Medications:       Current Outpatient Medications:  .  diclofenac (VOLTAREN) 75 MG EC tablet, Take 75 mg by mouth 2 (two) times daily. , Disp: , Rfl:  .  furosemide (LASIX) 40 MG tablet, Take 1 tablet (40 mg total) by mouth daily., Disp: 100 tablet, Rfl: 3 .  gabapentin (NEURONTIN) 100 MG capsule, Take 2 capsules (200 mg total) by mouth 3 (three) times daily. [id number], Disp: 540 capsule, Rfl: 3 .  hydroxychloroquine (PLAQUENIL) 200 MG tablet, TAKE 1 TABLET IN THE MORNING AND 1 TABLET IN THE EVENING, MONDAY THROUGH FRIDAY ONLY. NONE ON SATURDAY OR SUNDAY, Disp: 120 tablet, Rfl: 0 .  losartan-hydrochlorothiazide (HYZAAR) 100-25 MG tablet, Take 1 tablet by mouth daily. , Disp: , Rfl:  .  NONFORMULARY OR COMPOUNDED ITEM, Apply 1-2  application topically every 8 (eight) hours as needed (Diclofenac 3%, Lidocaine 5%, Menthol 1%     (  Cream 100 Gm))., Disp: , Rfl:  .  NUCYNTA 50 MG TABS tablet, Take 50 mg by mouth 3 (three) times daily as needed for moderate pain. , Disp: , Rfl:  .  pantoprazole (PROTONIX) 40 MG tablet, Take 40 mg by mouth daily. , Disp: , Rfl:  .  potassium chloride SA (K-DUR,KLOR-CON) 20 MEQ tablet, Take 1 tablet (20 mEq total) by mouth daily., Disp: 90 tablet, Rfl: 3 .  predniSONE (DELTASONE) 10 MG tablet, Take 3.5 tablets daily x2 weeks, then take 3 tablets daily x2 weeks, then take 2.5 tablets daily x2 weeks, then take 2 tablets daily., Disp: 154 tablet, Rfl: 0 .  predniSONE (DELTASONE) 5 MG tablet, Take 3.5 tabs x 2 wks., 3 x tabs x 2 wks., 2.5 tabs x 2 wks., 2 tabs daily, Disp: 140 tablet, Rfl: 0  Objective Blood pressure 100/60, pulse 93, weight 270 lb (122.5 kg).  General WDWN female NAD Vulva:  normal appearing vulva with no masses, tenderness or lesions Vagina:  No blood in the vagina no lesions noted Cervix:  No evidence of any blood presnet from cervix or in the vagina Uterus:  normal size, contour, position, consistency, mobility, non-tender Adnexa: ovaries:present,  normal adnexa in size, nontender and no masses Rectal good tone no masses Heme + stool card   Pertinent ROS No burning with urination, frequency or urgency No nausea, vomiting or diarrhea Nor fever chills or other constitutional symptoms   Labs or studies Non contributory     Impression Diagnoses this Encounter::   ICD-10-CM   1. BRBPR (bright red blood per rectum) K62.5     Established relevant diagnosis(es):   Plan/Recommendations: No orders of the defined types were placed in this encounter.   Labs or Scans Ordered: No orders of the defined types were placed in this encounter.   Management:: Will need to make appointment with Dr Olevia Perches office for evaluation of BRB per rectum  She is definitely not  having bleeding from her vagina/uterus or other gyn source based on today's history and exam Follow up Return if symptoms worsen or fail to improve, for will make appointment with Dr Olevia Perches office for evaluation of her BRB per rectum/heme positive sto.        All questions were answered.

## 2018-04-24 NOTE — Progress Notes (Signed)
*  PRELIMINARY RESULTS* Echocardiogram 2D Echocardiogram has been performed.  Nicole [name] 04/24/2018, 10:54 AM

## 2018-04-26 ENCOUNTER — Telehealth: Payer: Self-pay | Admitting: Obstetrics & Gynecology

## 2018-04-26 DIAGNOSIS — I10 Essential (primary) hypertension: Secondary | ICD-10-CM | POA: Diagnosis not present

## 2018-04-26 DIAGNOSIS — M5030 Other cervical disc degeneration, unspecified cervical region: Secondary | ICD-10-CM | POA: Diagnosis not present

## 2018-04-26 DIAGNOSIS — M316 Other giant cell arteritis: Secondary | ICD-10-CM | POA: Diagnosis not present

## 2018-04-26 DIAGNOSIS — M353 Polymyalgia rheumatica: Secondary | ICD-10-CM | POA: Diagnosis not present

## 2018-04-26 DIAGNOSIS — M069 Rheumatoid arthritis, unspecified: Secondary | ICD-10-CM | POA: Diagnosis not present

## 2018-04-26 DIAGNOSIS — M48061 Spinal stenosis, lumbar region without neurogenic claudication: Secondary | ICD-10-CM | POA: Diagnosis not present

## 2018-04-26 NOTE — Telephone Encounter (Signed)
Patient called with concerns of continued rectal bleeding and if unsure what to do at this point.  Informed patient that referral we placed and she could call there office to check on the status but there is nothing we can do at this time. Patient verbalized understanding and stated she would call.

## 2018-04-27 DIAGNOSIS — I5020 Unspecified systolic (congestive) heart failure: Secondary | ICD-10-CM | POA: Diagnosis not present

## 2018-04-27 DIAGNOSIS — K922 Gastrointestinal hemorrhage, unspecified: Secondary | ICD-10-CM | POA: Diagnosis not present

## 2018-04-27 DIAGNOSIS — Z8719 Personal history of other diseases of the digestive system: Secondary | ICD-10-CM | POA: Diagnosis not present

## 2018-04-27 DIAGNOSIS — Z6841 Body Mass Index (BMI) 40.0 and over, adult: Secondary | ICD-10-CM | POA: Diagnosis not present

## 2018-04-27 DIAGNOSIS — K625 Hemorrhage of anus and rectum: Secondary | ICD-10-CM | POA: Diagnosis not present

## 2018-04-28 DIAGNOSIS — M48061 Spinal stenosis, lumbar region without neurogenic claudication: Secondary | ICD-10-CM | POA: Diagnosis not present

## 2018-04-28 DIAGNOSIS — M353 Polymyalgia rheumatica: Secondary | ICD-10-CM | POA: Diagnosis not present

## 2018-04-28 DIAGNOSIS — R238 Other skin changes: Secondary | ICD-10-CM | POA: Diagnosis not present

## 2018-04-28 DIAGNOSIS — I10 Essential (primary) hypertension: Secondary | ICD-10-CM | POA: Diagnosis not present

## 2018-04-28 DIAGNOSIS — R262 Difficulty in walking, not elsewhere classified: Secondary | ICD-10-CM | POA: Diagnosis not present

## 2018-04-28 DIAGNOSIS — I890 Lymphedema, not elsewhere classified: Secondary | ICD-10-CM | POA: Diagnosis not present

## 2018-04-29 DIAGNOSIS — R238 Other skin changes: Secondary | ICD-10-CM | POA: Diagnosis not present

## 2018-04-29 DIAGNOSIS — I10 Essential (primary) hypertension: Secondary | ICD-10-CM | POA: Diagnosis not present

## 2018-04-29 DIAGNOSIS — R262 Difficulty in walking, not elsewhere classified: Secondary | ICD-10-CM | POA: Diagnosis not present

## 2018-04-29 DIAGNOSIS — I890 Lymphedema, not elsewhere classified: Secondary | ICD-10-CM | POA: Diagnosis not present

## 2018-04-29 DIAGNOSIS — M48061 Spinal stenosis, lumbar region without neurogenic claudication: Secondary | ICD-10-CM | POA: Diagnosis not present

## 2018-04-29 DIAGNOSIS — M353 Polymyalgia rheumatica: Secondary | ICD-10-CM | POA: Diagnosis not present

## 2018-04-30 ENCOUNTER — Ambulatory Visit (INDEPENDENT_AMBULATORY_CARE_PROVIDER_SITE_OTHER): Payer: Medicare Other | Admitting: Cardiovascular Disease

## 2018-04-30 ENCOUNTER — Other Ambulatory Visit (HOSPITAL_COMMUNITY)
Admission: RE | Admit: 2018-04-30 | Discharge: 2018-04-30 | Disposition: A | Discharge status: Home / Self Care | Payer: Medicare Other | Source: Ambulatory Visit | Attending: Cardiovascular Disease | Admitting: Cardiovascular Disease

## 2018-04-30 ENCOUNTER — Encounter (INDEPENDENT_AMBULATORY_CARE_PROVIDER_SITE_OTHER): Payer: Self-pay | Admitting: Internal Medicine

## 2018-04-30 ENCOUNTER — Telehealth: Payer: Self-pay | Admitting: *Deleted

## 2018-04-30 ENCOUNTER — Ambulatory Visit (INDEPENDENT_AMBULATORY_CARE_PROVIDER_SITE_OTHER): Payer: Medicare Other | Admitting: Internal Medicine

## 2018-04-30 ENCOUNTER — Encounter: Payer: Self-pay | Admitting: Cardiovascular Disease

## 2018-04-30 VITALS — BP 129/82 | HR 118 | Ht 60.0 in | Wt 273.0 lb

## 2018-04-30 VITALS — BP 124/70 | HR 80 | Temp 98.3°F | Ht 62.0 in | Wt 272.2 lb

## 2018-04-30 DIAGNOSIS — K625 Hemorrhage of anus and rectum: Secondary | ICD-10-CM | POA: Diagnosis not present

## 2018-04-30 DIAGNOSIS — K5732 Diverticulitis of large intestine without perforation or abscess without bleeding: Secondary | ICD-10-CM | POA: Diagnosis not present

## 2018-04-30 DIAGNOSIS — K5733 Diverticulitis of large intestine without perforation or abscess with bleeding: Secondary | ICD-10-CM | POA: Diagnosis not present

## 2018-04-30 DIAGNOSIS — I10 Essential (primary) hypertension: Secondary | ICD-10-CM

## 2018-04-30 DIAGNOSIS — N183 Chronic kidney disease, stage 3 unspecified: Secondary | ICD-10-CM

## 2018-04-30 DIAGNOSIS — M353 Polymyalgia rheumatica: Secondary | ICD-10-CM

## 2018-04-30 DIAGNOSIS — D62 Acute posthemorrhagic anemia: Secondary | ICD-10-CM | POA: Diagnosis not present

## 2018-04-30 DIAGNOSIS — I5031 Acute diastolic (congestive) heart failure: Secondary | ICD-10-CM | POA: Diagnosis not present

## 2018-04-30 DIAGNOSIS — Z79899 Other long term (current) drug therapy: Secondary | ICD-10-CM

## 2018-04-30 DIAGNOSIS — I5032 Chronic diastolic (congestive) heart failure: Secondary | ICD-10-CM | POA: Diagnosis not present

## 2018-04-30 DIAGNOSIS — N179 Acute kidney failure, unspecified: Secondary | ICD-10-CM | POA: Diagnosis not present

## 2018-04-30 DIAGNOSIS — Z6841 Body Mass Index (BMI) 40.0 and over, adult: Secondary | ICD-10-CM | POA: Diagnosis not present

## 2018-04-30 DIAGNOSIS — I130 Hypertensive heart and chronic kidney disease with heart failure and stage 1 through stage 4 chronic kidney disease, or unspecified chronic kidney disease: Secondary | ICD-10-CM | POA: Diagnosis not present

## 2018-04-30 LAB — HEMOGLOBIN AND HEMATOCRIT, BLOOD
HCT: 32.5 % — ABNORMAL LOW (ref 35.0–45.0)
Hemoglobin: 11.1 g/dL — ABNORMAL LOW (ref 11.7–15.5)

## 2018-04-30 LAB — BASIC METABOLIC PANEL
Anion gap: 11 (ref 5–15)
BUN: 42 mg/dL — ABNORMAL HIGH (ref 8–23)
CO2: 30 mmol/L (ref 22–32)
Calcium: 9.4 mg/dL (ref 8.9–10.3)
Chloride: 100 mmol/L (ref 98–111)
Creatinine, Ser: 1.78 mg/dL — ABNORMAL HIGH (ref 0.44–1.00)
GFR calc Af Amer: 32 mL/min — ABNORMAL LOW (ref >60)
GFR calc non Af Amer: 28 mL/min — ABNORMAL LOW (ref >60)
Glucose, Bld: 104 mg/dL — ABNORMAL HIGH (ref 70–99)
Potassium: 3.5 mmol/L (ref 3.5–5.1)
Sodium: 141 mmol/L (ref 135–145)

## 2018-04-30 NOTE — Patient Instructions (Signed)
Medication Instructions:  Your physician recommends that you continue on your current medications as directed. Please refer to the Current Medication list given to you today.   Labwork: BMET   Testing/Procedures: NONE  Follow-Up: Your physician recommends that you schedule a follow-up appointment in: 3 MONTHS     Any Other Special Instructions Will Be Listed Below (If Applicable).     If you need a refill on your cardiac medications before your next appointment, please call your pharmacy.

## 2018-04-30 NOTE — Progress Notes (Addendum)
Subjective:    Patient ID: Nicole [name], female    DOB: 01-10-1946, 72 y.o.   MRN: [id number] PCP Dr. Hilma Favors.  HPI   Referred by Dr. Elonda Husky for rectal bleeding.Rectal bleeding started a couple of months ago but stopped. She actually though she was having vaginal bleeding.  Was evaluated by Dr. Glo Herring 03/29/2018 and her GYN exam was negative for blood. She was advised per records to return to their office if  bleeding reoccurred.    She was evaluated by Dr. Elonda Husky  04/24/2018  and she was guaiac positive. No blood in the vagina. She also saw Dr. Nevada Crane 04/27/2018 for rectal bleeding.  She notes blood with her BMs.  She says the rectal bleeding started again 04/24/2018. Has seen bleeding everyday with her BMs.   She says the blood drips in the commode when she has a BM. Describes as BRRB.  Last colonoscopy in 2013 by Dr. Arnoldo Morale (screening) Normal.  Diverticulosis in descending colon.  Diclofenac is on hold since Saturday. Usually has 2-3 stools a day.  Really no abdominal pain.  Hx of severe arthritis.       Hx of diverticulitis and diverticulosis.   04/28/2015 H and H 10.4 04/16/2018 Hemoglobin 11.8.  CBC Latest Ref Rng & Units 04/16/2018 02/12/2018 01/09/2018  WBC 4.0 - 10.5 K/uL 9.6 10.9(H) 6.1  Hemoglobin 12.0 - 15.0 g/dL 11.8(L) 12.1 11.4(L)  Hematocrit 36.0 - 46.0 % 36.9 36.4 33.9(L)  Platelets 150 - 400 K/uL 279 299 290     Review of Systems Past Medical History:  Diagnosis Date  . AC (acromioclavicular) joint bone spurs    lt shoulder  . Anemia   . Arthritis   . Carpal tunnel syndrome, bilateral   . Gastroesophageal reflux   . Headache    recent visit to ER @ [facility] for severe headache  . Hypertension   . Lumbar stenosis    Hx of ESIs by Dr. Nelva Bush  . Polyarthralgia   . Polymyalgia ([place])   . Shingles   . Spinal stenosis     Past Surgical History:  Procedure Laterality Date  . CHOLECYSTECTOMY    . COLONOSCOPY  06/11/2012   Procedure: COLONOSCOPY;  Surgeon: Jamesetta So, MD;   [facility];  Service: Gastroenterology;  Laterality: N/A;  . HYSTEROSCOPY W/D&C N/A 02/25/2014   Procedure: DILATATION AND CURETTAGE /HYSTEROSCOPY;  Surgeon: Florian Buff, MD;  Location: [facility];  Service: Gynecology;  Laterality: N/A;  . POLYPECTOMY N/A 02/25/2014   Procedure: POLYPECTOMY;  Surgeon: Florian Buff, MD;  Location: [facility];  Service: Gynecology;  Laterality: N/A;  . RESECTION DISTAL CLAVICAL Right 03/26/2015   Procedure: OPEN DISTAL CLAVICAL RESECTION ;  Surgeon: Netta Cedars, MD;  Location: [facility];  Service: Orthopedics;  Laterality: Right;    Allergies  Allergen Reactions  . Oxycontin [Oxycodone Hcl] Swelling  . Sulfa Antibiotics Other (See Comments)    Sores  . Tramadol     "Makes me feel like I am falling"  . Penicillins Rash  . Sulfasalazine Other (See Comments)    Sores    Current Outpatient Medications on File Prior to Visit  Medication Sig Dispense Refill  . furosemide (LASIX) 40 MG tablet Take 1 tablet (40 mg total) by mouth daily. 100 tablet 3  . gabapentin (NEURONTIN) 100 MG capsule Take 2 capsules (200 mg total) by mouth 3 (three) times daily. [id number] 540 capsule 3  . hydroxychloroquine (PLAQUENIL) 200 MG tablet TAKE 1 TABLET  IN THE MORNING AND 1 TABLET IN THE EVENING, MONDAY THROUGH FRIDAY ONLY. NONE ON SATURDAY OR SUNDAY 120 tablet 0  . losartan-hydrochlorothiazide (HYZAAR) 100-25 MG tablet Take 1 tablet by mouth daily.     . NONFORMULARY OR COMPOUNDED ITEM Apply 1-2 application topically every 8 (eight) hours as needed (Diclofenac 3%, Lidocaine 5%, Menthol 1%     (Cream 100 Gm)).    . NUCYNTA 50 MG TABS tablet Take 50 mg by mouth 3 (three) times daily as needed for moderate pain.     . pantoprazole (PROTONIX) 40 MG tablet Take 40 mg by mouth daily.     . potassium chloride SA (K-DUR,KLOR-CON) 20 MEQ tablet Take 1 tablet (20 mEq total) by mouth daily. 90 tablet 3  . predniSONE (DELTASONE) 10 MG tablet Take 3.5 tablets daily x2 weeks, then take 3  tablets daily x2 weeks, then take 2.5 tablets daily x2 weeks, then take 2 tablets daily. 154 tablet 0  . predniSONE (DELTASONE) 5 MG tablet Take 3.5 tabs x 2 wks., 3 x tabs x 2 wks., 2.5 tabs x 2 wks., 2 tabs daily 140 tablet 0   No current facility-administered medications on file prior to visit.         Objective:   Physical Exam Blood pressure 124/70, pulse 80, temperature 98.3 F (36.8 C), height 5\' 2"  (1.575 m), weight 272 lb 3.2 oz (123.5 kg). Alert and oriented. Skin warm and dry. Oral mucosa is moist.   . Sclera anicteric, conjunctivae is pink. Thyroid not enlarged. No cervical lymphadenopathy. Lungs clear. Heart regular rate and rhythm.  Abdomen is soft. Bowel sounds are positive. No hepatomegaly. No abdominal masses felt. No tenderness.  No edema to lower extremities.  Stool brown and she did have some Bright red blood (small amt). No masses felt.          Assessment & Plan:  Rectal bleeding. AM going to get an H and H today. ? Diverticular.  Advised if bleeding continues or becomes heavier, to go to the ED.  Will get colonoscopy scheduled soon. Order has been placed.

## 2018-04-30 NOTE — Progress Notes (Signed)
SUBJECTIVE: The patient presents for follow-up of acute CHF with bilateral leg edema and exertional dyspnea.  Echocardiogram on 04/24/2018 demonstrated vigorous left ventricular systolic function, LVEF 65 to 73%, grade 1 diastolic dysfunction with high ventricular filling pressures, mild concentric and moderate basal septal hypertrophy.  Past medical history includes polymyalgia rheumatica for which she takes Plaquenil.  She also has diffuse osteoarthritis affecting both knees and also has lumbar spinal stenosis.    Her weight is down 5 pounds since her last visit with me.  She has developed rectal bleeding over the past several days.  She told me she went to Dr. Juel Burrow urgent care this past Saturday and her hemoglobin was 10.  It was 11.8 on 04/16/2018.  Oxygen saturations are normal.  She appears weak in my office.  She is mildly tachycardic.  Most recent BUN and creatinine I find are 41 and 1.98 on 04/19/2018.  She feels like her bilateral leg swelling improved with Lasix.  She feels weak and tired since her rectal bleeding began.  She is due to see GI later today.    Review of Systems: As per "subjective", otherwise negative.  Allergies  Allergen Reactions  . Oxycontin [Oxycodone Hcl] Swelling  . Sulfa Antibiotics Other (See Comments)    Sores  . Tramadol     "Makes me feel like I am falling"  . Penicillins Rash  . Sulfasalazine Other (See Comments)    Sores    Current Outpatient Medications  Medication Sig Dispense Refill  . furosemide (LASIX) 40 MG tablet Take 1 tablet (40 mg total) by mouth daily. 100 tablet 3  . gabapentin (NEURONTIN) 100 MG capsule Take 2 capsules (200 mg total) by mouth 3 (three) times daily. [id number] 540 capsule 3  . hydroxychloroquine (PLAQUENIL) 200 MG tablet TAKE 1 TABLET IN THE MORNING AND 1 TABLET IN THE EVENING, MONDAY THROUGH FRIDAY ONLY. NONE ON SATURDAY OR SUNDAY 120 tablet 0  . losartan-hydrochlorothiazide (HYZAAR) 100-25 MG tablet  Take 1 tablet by mouth daily.     . NONFORMULARY OR COMPOUNDED ITEM Apply 1-2 application topically every 8 (eight) hours as needed (Diclofenac 3%, Lidocaine 5%, Menthol 1%     (Cream 100 Gm)).    . NUCYNTA 50 MG TABS tablet Take 50 mg by mouth 3 (three) times daily as needed for moderate pain.     . pantoprazole (PROTONIX) 40 MG tablet Take 40 mg by mouth daily.     . potassium chloride SA (K-DUR,KLOR-CON) 20 MEQ tablet Take 1 tablet (20 mEq total) by mouth daily. 90 tablet 3  . predniSONE (DELTASONE) 10 MG tablet Take 3.5 tablets daily x2 weeks, then take 3 tablets daily x2 weeks, then take 2.5 tablets daily x2 weeks, then take 2 tablets daily. 154 tablet 0  . predniSONE (DELTASONE) 5 MG tablet Take 3.5 tabs x 2 wks., 3 x tabs x 2 wks., 2.5 tabs x 2 wks., 2 tabs daily 140 tablet 0   No current facility-administered medications for this visit.     Past Medical History:  Diagnosis Date  . AC (acromioclavicular) joint bone spurs    lt shoulder  . Anemia   . Arthritis   . Carpal tunnel syndrome, bilateral   . Gastroesophageal reflux   . Headache    recent visit to ER @ [facility] for severe headache  . Hypertension   . Lumbar stenosis    Hx of ESIs by Dr. Nelva Bush  . Polyarthralgia   . Polymyalgia (  [place])   . Shingles   . Spinal stenosis     Past Surgical History:  Procedure Laterality Date  . CHOLECYSTECTOMY    . COLONOSCOPY  06/11/2012   Procedure: COLONOSCOPY;  Surgeon: Jamesetta So, MD;  [facility];  Service: Gastroenterology;  Laterality: N/A;  . HYSTEROSCOPY W/D&C N/A 02/25/2014   Procedure: DILATATION AND CURETTAGE /HYSTEROSCOPY;  Surgeon: Florian Buff, MD;  Location: [facility];  Service: Gynecology;  Laterality: N/A;  . POLYPECTOMY N/A 02/25/2014   Procedure: POLYPECTOMY;  Surgeon: Florian Buff, MD;  Location: [facility];  Service: Gynecology;  Laterality: N/A;  . RESECTION DISTAL CLAVICAL Right 03/26/2015   Procedure: OPEN DISTAL CLAVICAL RESECTION ;  Surgeon: Netta Cedars, MD;  Location: [facility];  Service: Orthopedics;  Laterality: Right;    Social History   Socioeconomic History  . Marital status: Widowed    Spouse name: Not on file  . Number of children: 2  . Years of education: 1 yr coll  . Highest education level: Not on file  Occupational History  . Occupation: Retired  Scientific laboratory technician  . Financial resource strain: Not on file  . Food insecurity:    Worry: Not on file    Inability: Not on file  . Transportation needs:    Medical: Not on file    Non-medical: Not on file  Tobacco Use  . Smoking status: Never Smoker  . Smokeless tobacco: Never Used  Substance and Sexual Activity  . Alcohol use: No  . Drug use: No  . Sexual activity: Not Currently    Birth control/protection: Post-menopausal  Lifestyle  . Physical activity:    Days per week: Not on file    Minutes per session: Not on file  . Stress: Not on file  Relationships  . Social connections:    Talks on phone: Not on file    Gets together: Not on file    Attends religious service: Not on file    Active member of club or organization: Not on file    Attends meetings of clubs or organizations: Not on file    Relationship status: Not on file  . Intimate partner violence:    Fear of current or ex partner: Not on file    Emotionally abused: Not on file    Physically abused: Not on file    Forced sexual activity: Not on file  Other Topics Concern  . Not on file  Social History Narrative   Lives at home alone.   Right-handed.   1-2 cups caffeine daily.     Vitals:   04/30/18 0951  BP: 129/82  Pulse: (!) 118  SpO2: 97%  Weight: 273 lb (123.8 kg)  Height: 5' (1.524 m)    Wt Readings from Last 3 Encounters:  04/30/18 273 lb (123.8 kg)  04/24/18 270 lb (122.5 kg)  04/17/18 278 lb 3.2 oz (126.2 kg)     PHYSICAL EXAM General: NAD, ill-appearing HEENT: Normal. Neck: No JVD, no thyromegaly. Lungs: Clear to auscultation bilaterally with normal respiratory  effort. CV: Regular rate and rhythm, normal S1/S2, no S3/S4, no murmur.  Trace bilateral lower extremity edema. Abdomen: Soft, nontender, no distention.  Neurologic: Alert and oriented.  Psych: Normal affect. Skin: Normal. Musculoskeletal: No gross deformities.    ECG: Reviewed above under Subjective   Labs: Lab Results  Component Value Date/Time   K 3.8 04/19/2018 09:36 AM   BUN 41 (H) 04/19/2018 09:36 AM   BUN 16 12/18/2017  12:47 PM   CREATININE 1.98 (H) 04/19/2018 09:36 AM   CREATININE 1.47 (H) 02/12/2018 10:10 AM   ALT 43 (H) 02/12/2018 10:10 AM   TSH 3.77 01/09/2018 10:37 AM   HGB 11.8 (L) 04/16/2018 11:06 AM   HGB 12.2 12/18/2017 12:47 PM     Lipids: No results found for: LDLCALC, LDLDIRECT, CHOL, TRIG, HDL     ASSESSMENT AND PLAN: 1.  Acute diastolic heart failure with bilateral leg edema and exertional dyspnea: Weight is down 5 pounds from her last visit.  She is on Lasix 40 mg daily with supplemental potassium.  I will check to see if a basic metabolic panel was checked this past Saturday at urgent care.  If it was not, I will obtain one.  2.  Hypertension: Blood pressure is controlled on Hyzaar 100-25 mg daily.  No changes.  3.  Polymyalgia rheumatica: Currently on Plaquenil and prednisone.  4.  Rectal bleeding: She is due to see GI later today.  I will try to obtain a copy of her CBC from the urgent care this past Saturday.  She said it was 10.  Hemoglobin was 11.8 on 04/16/2018.  Unclear if this is related to hemorrhoids or diverticular bleeding.  5.  Chronic kidney disease stage III: BUN 41 and creatinine 1.98 on 04/19/2018.  I will obtain a basic metabolic panel if one has not been checked in the past several days.   Disposition: Follow up 3 months   Nicole [name], M.D., F.A.C.C.

## 2018-04-30 NOTE — Telephone Encounter (Signed)
-----   Message from Herminio Commons, MD sent at 04/30/2018 11:58 AM EDT ----- BUN and creatinine elevated which may also be indicative of GI bleed (volume loss) as well as Lasix use. Repeat in 1 month.

## 2018-04-30 NOTE — Patient Instructions (Signed)
H and H today. If bleeding increases, go to the ED.

## 2018-05-01 ENCOUNTER — Emergency Department (HOSPITAL_COMMUNITY): Payer: Medicare Other

## 2018-05-01 ENCOUNTER — Other Ambulatory Visit: Payer: Self-pay

## 2018-05-01 ENCOUNTER — Inpatient Hospital Stay (HOSPITAL_COMMUNITY)
Admission: EM | Admit: 2018-05-01 | Discharge: 2018-05-06 | DRG: 378 | Disposition: A | Discharge status: Skilled Nursing Facility | Payer: Medicare Other | Attending: Internal Medicine | Admitting: Internal Medicine

## 2018-05-01 ENCOUNTER — Encounter (HOSPITAL_COMMUNITY): Payer: Self-pay | Admitting: Emergency Medicine

## 2018-05-01 DIAGNOSIS — I10 Essential (primary) hypertension: Secondary | ICD-10-CM | POA: Diagnosis present

## 2018-05-01 DIAGNOSIS — E66813 Obesity, class 3: Secondary | ICD-10-CM

## 2018-05-01 DIAGNOSIS — Z888 Allergy status to other drugs, medicaments and biological substances status: Secondary | ICD-10-CM

## 2018-05-01 DIAGNOSIS — Z9049 Acquired absence of other specified parts of digestive tract: Secondary | ICD-10-CM

## 2018-05-01 DIAGNOSIS — Z79899 Other long term (current) drug therapy: Secondary | ICD-10-CM

## 2018-05-01 DIAGNOSIS — E860 Dehydration: Secondary | ICD-10-CM | POA: Diagnosis present

## 2018-05-01 DIAGNOSIS — R0902 Hypoxemia: Secondary | ICD-10-CM | POA: Diagnosis not present

## 2018-05-01 DIAGNOSIS — N183 Chronic kidney disease, stage 3 (moderate): Secondary | ICD-10-CM

## 2018-05-01 DIAGNOSIS — Z880 Allergy status to penicillin: Secondary | ICD-10-CM

## 2018-05-01 DIAGNOSIS — R339 Retention of urine, unspecified: Secondary | ICD-10-CM | POA: Diagnosis present

## 2018-05-01 DIAGNOSIS — R0689 Other abnormalities of breathing: Secondary | ICD-10-CM | POA: Diagnosis not present

## 2018-05-01 DIAGNOSIS — N179 Acute kidney failure, unspecified: Secondary | ICD-10-CM | POA: Diagnosis present

## 2018-05-01 DIAGNOSIS — K5733 Diverticulitis of large intestine without perforation or abscess with bleeding: Principal | ICD-10-CM | POA: Diagnosis present

## 2018-05-01 DIAGNOSIS — M545 Low back pain: Secondary | ICD-10-CM | POA: Diagnosis not present

## 2018-05-01 DIAGNOSIS — Z882 Allergy status to sulfonamides status: Secondary | ICD-10-CM

## 2018-05-01 DIAGNOSIS — M5489 Other dorsalgia: Secondary | ICD-10-CM | POA: Diagnosis not present

## 2018-05-01 DIAGNOSIS — K5732 Diverticulitis of large intestine without perforation or abscess without bleeding: Secondary | ICD-10-CM

## 2018-05-01 DIAGNOSIS — I130 Hypertensive heart and chronic kidney disease with heart failure and stage 1 through stage 4 chronic kidney disease, or unspecified chronic kidney disease: Secondary | ICD-10-CM | POA: Diagnosis present

## 2018-05-01 DIAGNOSIS — Z6841 Body Mass Index (BMI) 40.0 and over, adult: Secondary | ICD-10-CM | POA: Diagnosis not present

## 2018-05-01 DIAGNOSIS — M353 Polymyalgia rheumatica: Secondary | ICD-10-CM | POA: Diagnosis present

## 2018-05-01 DIAGNOSIS — E876 Hypokalemia: Secondary | ICD-10-CM | POA: Diagnosis present

## 2018-05-01 DIAGNOSIS — G894 Chronic pain syndrome: Secondary | ICD-10-CM | POA: Diagnosis present

## 2018-05-01 DIAGNOSIS — R52 Pain, unspecified: Secondary | ICD-10-CM | POA: Diagnosis not present

## 2018-05-01 DIAGNOSIS — I5032 Chronic diastolic (congestive) heart failure: Secondary | ICD-10-CM | POA: Diagnosis not present

## 2018-05-01 DIAGNOSIS — M4807 Spinal stenosis, lumbosacral region: Secondary | ICD-10-CM

## 2018-05-01 DIAGNOSIS — Z885 Allergy status to narcotic agent status: Secondary | ICD-10-CM

## 2018-05-01 DIAGNOSIS — K5792 Diverticulitis of intestine, part unspecified, without perforation or abscess without bleeding: Secondary | ICD-10-CM | POA: Diagnosis present

## 2018-05-01 DIAGNOSIS — K219 Gastro-esophageal reflux disease without esophagitis: Secondary | ICD-10-CM | POA: Diagnosis present

## 2018-05-01 DIAGNOSIS — D631 Anemia in chronic kidney disease: Secondary | ICD-10-CM | POA: Diagnosis present

## 2018-05-01 DIAGNOSIS — Z7952 Long term (current) use of systemic steroids: Secondary | ICD-10-CM

## 2018-05-01 DIAGNOSIS — D62 Acute posthemorrhagic anemia: Secondary | ICD-10-CM | POA: Diagnosis present

## 2018-05-01 DIAGNOSIS — Z8249 Family history of ischemic heart disease and other diseases of the circulatory system: Secondary | ICD-10-CM

## 2018-05-01 LAB — CBC WITH DIFFERENTIAL/PLATELET
Basophils Absolute: 0 10*3/uL (ref 0.0–0.1)
Basophils Relative: 0 %
Eosinophils Absolute: 0.1 10*3/uL (ref 0.0–0.7)
Eosinophils Relative: 1 %
HCT: 31.3 % — ABNORMAL LOW (ref 36.0–46.0)
Hemoglobin: 10.1 g/dL — ABNORMAL LOW (ref 12.0–15.0)
Lymphocytes Relative: 16 %
Lymphs Abs: 2.6 10*3/uL (ref 0.7–4.0)
MCH: 29.6 pg (ref 26.0–34.0)
MCHC: 32.3 g/dL (ref 30.0–36.0)
MCV: 91.8 fL (ref 78.0–100.0)
Monocytes Absolute: 1.1 10*3/uL — ABNORMAL HIGH (ref 0.1–1.0)
Monocytes Relative: 6 %
Neutro Abs: 12.7 10*3/uL — ABNORMAL HIGH (ref 1.7–7.7)
Neutrophils Relative %: 77 %
Platelets: 297 10*3/uL (ref 150–400)
RBC: 3.41 MIL/uL — ABNORMAL LOW (ref 3.87–5.11)
RDW: 15.3 % (ref 11.5–15.5)
WBC: 16.5 10*3/uL — ABNORMAL HIGH (ref 4.0–10.5)

## 2018-05-01 LAB — COMPREHENSIVE METABOLIC PANEL
ALT: 21 U/L (ref 0–44)
AST: 19 U/L (ref 15–41)
Albumin: 3 g/dL — ABNORMAL LOW (ref 3.5–5.0)
Alkaline Phosphatase: 68 U/L (ref 38–126)
Anion gap: 11 (ref 5–15)
BUN: 44 mg/dL — ABNORMAL HIGH (ref 8–23)
CO2: 28 mmol/L (ref 22–32)
Calcium: 8.9 mg/dL (ref 8.9–10.3)
Chloride: 100 mmol/L (ref 98–111)
Creatinine, Ser: 2.15 mg/dL — ABNORMAL HIGH (ref 0.44–1.00)
GFR calc Af Amer: 25 mL/min — ABNORMAL LOW (ref >60)
GFR calc non Af Amer: 22 mL/min — ABNORMAL LOW (ref >60)
Glucose, Bld: 130 mg/dL — ABNORMAL HIGH (ref 70–99)
Potassium: 3.8 mmol/L (ref 3.5–5.1)
Sodium: 139 mmol/L (ref 135–145)
Total Bilirubin: 0.9 mg/dL (ref 0.3–1.2)
Total Protein: 6.4 g/dL — ABNORMAL LOW (ref 6.5–8.1)

## 2018-05-01 LAB — SAMPLE TO BLOOD BANK

## 2018-05-01 LAB — LACTIC ACID, PLASMA: Lactic Acid, Venous: 1.9 mmol/L (ref 0.5–1.9)

## 2018-05-01 LAB — POC OCCULT BLOOD, ED: Fecal Occult Bld: POSITIVE — AB

## 2018-05-01 MED ORDER — TAPENTADOL HCL 50 MG PO TABS: 50.0000 mg | ORAL_TABLET | Freq: Three times a day (TID) | ORAL | Status: DC | PRN

## 2018-05-01 MED ORDER — ACETAMINOPHEN 325 MG PO TABS
650.0000 mg | ORAL_TABLET | Freq: Four times a day (QID) | ORAL | Status: DC | PRN
  Administered 2018-05-04 – 2018-05-05 (×3): 650 mg via ORAL
  Filled 2018-05-01 (×3): qty 2

## 2018-05-01 MED ORDER — CIPROFLOXACIN IN D5W 400 MG/200ML IV SOLN
400.0000 mg | Freq: Once | INTRAVENOUS | Status: AC
  Administered 2018-05-01: 400 mg via INTRAVENOUS
  Filled 2018-05-01: qty 200

## 2018-05-01 MED ORDER — SODIUM CHLORIDE 0.9% FLUSH
3.0000 mL | INTRAVENOUS | Status: DC | PRN
  Administered 2018-05-01 – 2018-05-03 (×3): 3 mL via INTRAVENOUS
  Filled 2018-05-01 (×3): qty 3

## 2018-05-01 MED ORDER — SODIUM CHLORIDE 0.9 % IV SOLN
INTRAVENOUS | Status: AC
Start: 2018-05-01 — End: 2018-05-01
  Administered 2018-05-01: 15:00:00 via INTRAVENOUS

## 2018-05-01 MED ORDER — LORAZEPAM 2 MG/ML IJ SOLN
1.0000 mg | Freq: Once | INTRAMUSCULAR | Status: DC
  Filled 2018-05-01: qty 1

## 2018-05-01 MED ORDER — HYDROMORPHONE HCL 1 MG/ML IJ SOLN
0.7500 mg | Freq: Once | INTRAMUSCULAR | Status: AC
  Administered 2018-05-01: 0.75 mg via INTRAVENOUS
  Filled 2018-05-01: qty 1

## 2018-05-01 MED ORDER — SODIUM CHLORIDE 0.9% FLUSH
3.0000 mL | Freq: Two times a day (BID) | INTRAVENOUS | Status: DC
Start: 2018-05-01 — End: 2018-05-06
  Administered 2018-05-01 – 2018-05-05 (×7): 3 mL via INTRAVENOUS

## 2018-05-01 MED ORDER — GABAPENTIN 100 MG PO CAPS
200.0000 mg | ORAL_CAPSULE | Freq: Three times a day (TID) | ORAL | Status: DC
  Administered 2018-05-01 – 2018-05-06 (×14): 200 mg via ORAL
  Filled 2018-05-01 (×15): qty 2

## 2018-05-01 MED ORDER — METRONIDAZOLE IN NACL 5-0.79 MG/ML-% IV SOLN
500.0000 mg | Freq: Once | INTRAVENOUS | Status: AC
  Administered 2018-05-01: 500 mg via INTRAVENOUS
  Filled 2018-05-01: qty 100

## 2018-05-01 MED ORDER — PREDNISONE 10 MG PO TABS
15.0000 mg | ORAL_TABLET | Freq: Every day | ORAL | Status: DC
  Administered 2018-05-02 – 2018-05-06 (×5): 15 mg via ORAL
  Filled 2018-05-01 (×5): qty 2

## 2018-05-01 MED ORDER — FENTANYL CITRATE (PF) 100 MCG/2ML IJ SOLN
50.0000 ug | Freq: Once | INTRAMUSCULAR | Status: AC
Start: 2018-05-01 — End: 2018-05-01
  Administered 2018-05-01: 50 ug via INTRAVENOUS
  Filled 2018-05-01: qty 2

## 2018-05-01 MED ORDER — CIPROFLOXACIN IN D5W 400 MG/200ML IV SOLN
400.0000 mg | Freq: Two times a day (BID) | INTRAVENOUS | Status: DC
  Administered 2018-05-01 – 2018-05-03 (×4): 400 mg via INTRAVENOUS
  Filled 2018-05-01 (×4): qty 200

## 2018-05-01 MED ORDER — ACETAMINOPHEN 650 MG RE SUPP: 650.0000 mg | Freq: Four times a day (QID) | RECTAL | Status: DC | PRN

## 2018-05-01 MED ORDER — FUROSEMIDE 40 MG PO TABS
40.0000 mg | ORAL_TABLET | Freq: Every day | ORAL | Status: DC
  Administered 2018-05-02 – 2018-05-06 (×5): 40 mg via ORAL
  Filled 2018-05-01 (×5): qty 1

## 2018-05-01 MED ORDER — HYDROMORPHONE HCL 2 MG PO TABS
2.0000 mg | ORAL_TABLET | Freq: Three times a day (TID) | ORAL | Status: DC | PRN
  Administered 2018-05-01 – 2018-05-02 (×3): 2 mg via ORAL
  Filled 2018-05-01 (×4): qty 1

## 2018-05-01 MED ORDER — SODIUM CHLORIDE 0.9 % IV SOLN: 250.0000 mL | INTRAVENOUS | Status: DC | PRN

## 2018-05-01 MED ORDER — HYDROXYCHLOROQUINE SULFATE 200 MG PO TABS
200.0000 mg | ORAL_TABLET | ORAL | Status: DC
  Administered 2018-05-01 – 2018-05-06 (×5): 200 mg via ORAL
  Filled 2018-05-01 (×5): qty 1

## 2018-05-01 MED ORDER — DEXAMETHASONE SODIUM PHOSPHATE 10 MG/ML IJ SOLN
10.0000 mg | Freq: Once | INTRAMUSCULAR | Status: AC
  Administered 2018-05-01: 10 mg via INTRAVENOUS
  Filled 2018-05-01: qty 1

## 2018-05-01 MED ORDER — METRONIDAZOLE IN NACL 5-0.79 MG/ML-% IV SOLN
500.0000 mg | Freq: Three times a day (TID) | INTRAVENOUS | Status: DC
  Administered 2018-05-01 – 2018-05-03 (×7): 500 mg via INTRAVENOUS
  Filled 2018-05-01 (×7): qty 100

## 2018-05-01 MED ORDER — POTASSIUM CHLORIDE CRYS ER 20 MEQ PO TBCR
20.0000 meq | EXTENDED_RELEASE_TABLET | Freq: Every day | ORAL | Status: DC
  Administered 2018-05-01 – 2018-05-03 (×3): 20 meq via ORAL
  Filled 2018-05-01 (×4): qty 1

## 2018-05-01 MED ORDER — ONDANSETRON HCL 4 MG/2ML IJ SOLN
4.0000 mg | Freq: Four times a day (QID) | INTRAMUSCULAR | Status: DC | PRN
Start: 2018-05-01 — End: 2018-05-06

## 2018-05-01 MED ORDER — PANTOPRAZOLE SODIUM 40 MG PO TBEC
40.0000 mg | DELAYED_RELEASE_TABLET | Freq: Every day | ORAL | Status: DC
Start: 2018-05-01 — End: 2018-05-06
  Administered 2018-05-01 – 2018-05-06 (×6): 40 mg via ORAL
  Filled 2018-05-01 (×6): qty 1

## 2018-05-01 NOTE — ED Notes (Signed)
Pt returned from CT °

## 2018-05-01 NOTE — ED Provider Notes (Addendum)
MSE was initiated and I personally evaluated the patient and placed orders (if any) at  6:04 AM on May 01, 2018.  The patient appears stable so that the remainder of the MSE may be completed by another provider.  Patient has a history of chronic low back pain.  She also has a history of rectal or vaginal bleeding that she states started last month after she was placed on Lasix.  She has been evaluated by Dr. Glo Herring on June 7 and Dr. Elonda Husky on July 3.  They felt her bleeding was rectal in origin.  She was seen yesterday by GI.  Her last colonoscopy was 2013 when she had diverticulosis in the descending colon.  On her rectal yesterday they states she had brown stool and a small amount of bright red blood.  No masses were felt.  They were going to schedule colonoscopy.  She was told that the bleeding became heavier go to the ED. Patient states she woke up with increasing pain, she complains of pain in her left side and in her left hip.  She states the pain usually goes into her leg but this pain is different but she cannot tell me how.  Patient states she did have a large amount of rectal bleeding tonight however it has since resolved.  She also states she had an echocardiogram done on July 3.  She states she has nausea without vomiting.  Patient is very labile, she can be heard yelling across the ED and then whining loudly. She is yelping and yelling.   She is very hard to focus and get an idea what the problem is tonight.  She is obese, she is laying on her right side.  She points to her left lateral abdomen/left hip area is painful.  She states "I cannot stand it".  Laboratory testing was started, CT was done, her last abdominal CT was in 2017 at that time she had no evidence of an aneurysm.  I suspect she is just having some radicular pain from her chronic back problems and her rectal bleeding is already being evaluated by appropriate specialist.   Review of the [facility] shows she has no  entry including her name although she states she is on Nucynta for chronic pain.  Patient's name was changed in the computer, now I can find her in the [facility]  She gets #90 Nucynta 50 mg tablets monthly from Dr. Nelva Bush or his staff, last filled June 20.  Rolland Porter, MD, Barbette Or, MD 05/01/18 [id number]    Rolland Porter, MD 05/01/18 Rachael Fee    Rolland Porter, MD 05/01/18 [phone]

## 2018-05-01 NOTE — ED Notes (Signed)
Patient transported to CT 

## 2018-05-01 NOTE — H&P (Signed)
History and Physical    Nicole [name] FVC:[id number] DOB: 02/21/1946 DOA: 05/01/2018  Referring MD/NP/PA: Dr. Wilson Singer  PCP: Sharilyn Sites, MD  Patient coming from: Home  Chief Complaint: LLQ pain, nausea, anorexia.  HPI: Nicole [name] is a 72 y.o. female with significant PMH for HTN, CKD stage 3, chronic diastolic HF, morbid Obesity, GERD, diverticulosis and PMR (on chronic prednisone); who presented to ED with complaints of abd pain, anorexia and nausea. Symptoms has been present for the last week or so; patient reported pain as intermittent, crampy in nature, localized in her LLQ, no radiating, associated with nausea (but no vomiting) and anorexia. She expressed pain at its worst is 8-9/10 and was worsen by eating. Patient also reported intermittent episodes of BRBPR. She denies CP, SOB, HA's, focal weakness, dysuria, hematemesis and hematuria.  In ED FOBT was positive, patient with elevated WBC's, slight worsening on her renal function and CT scan demonstrating large intestine diverticulitis. TRH called to place in observation due to ongoing nausea, anorexia and slight worsening on renal function.  Past Medical/Surgical History: Past Medical History:  Diagnosis Date  . AC (acromioclavicular) joint bone spurs    lt shoulder  . Anemia   . Arthritis   . Carpal tunnel syndrome, bilateral   . Gastroesophageal reflux   . Headache    recent visit to ER @ [facility] for severe headache  . Hypertension   . Lumbar stenosis    Hx of ESIs by Dr. Nelva Bush  . Polyarthralgia   . Polymyalgia ([place])   . Shingles   . Spinal stenosis     Past Surgical History:  Procedure Laterality Date  . CHOLECYSTECTOMY    . COLONOSCOPY  06/11/2012   Procedure: COLONOSCOPY;  Surgeon: Jamesetta So, MD;  Location: [facility];  Service: Gastroenterology;  Laterality: N/A;  . HYSTEROSCOPY W/D&C N/A 02/25/2014   Procedure: DILATATION AND CURETTAGE /HYSTEROSCOPY;  Surgeon: Florian Buff, MD;  Location: [facility];   Service: Gynecology;  Laterality: N/A;  . POLYPECTOMY N/A 02/25/2014   Procedure: POLYPECTOMY;  Surgeon: Florian Buff, MD;  Location: [facility];  Service: Gynecology;  Laterality: N/A;  . RESECTION DISTAL CLAVICAL Right 03/26/2015   Procedure: OPEN DISTAL CLAVICAL RESECTION ;  Surgeon: Netta Cedars, MD;  Location: [place];  Service: Orthopedics;  Laterality: Right;    Social History:  reports that she has never smoked. She has never used smokeless tobacco. She reports that she does not drink alcohol or use drugs.  Allergies: Allergies  Allergen Reactions  . Oxycontin [Oxycodone Hcl] Swelling  . Sulfa Antibiotics Other (See Comments)    Sores  . Tramadol     "Makes me feel like I am falling"  . Penicillins Rash  . Sulfasalazine Other (See Comments)    Sores    Family History:  Family History  Problem Relation Age of Onset  . Hypertension Mother   . Pneumonia Father   . Arthritis Sister   . Diabetes Paternal Grandmother     Prior to Admission medications   Medication Sig Start Date End Date Taking? Authorizing Provider  furosemide (LASIX) 40 MG tablet Take 1 tablet (40 mg total) by mouth daily. 04/17/18 07/16/18 Yes Herminio Commons, MD  gabapentin (NEURONTIN) 100 MG capsule Take 2 capsules (200 mg total) by mouth 3 (three) times daily. [id number] 11/02/17  Yes Marcial Pacas, MD  hydroxychloroquine (PLAQUENIL) 200 MG tablet TAKE 1 TABLET IN THE MORNING AND 1 TABLET IN THE EVENING, MONDAY THROUGH FRIDAY ONLY.  NONE ON SATURDAY OR SUNDAY 03/13/18  Yes Deveshwar, Abel Presto, MD  losartan-hydrochlorothiazide (HYZAAR) 100-25 MG tablet Take 1 tablet by mouth daily.  06/01/17  Yes [provider]  NONFORMULARY OR COMPOUNDED ITEM Apply 1-2 application topically every 8 (eight) hours as needed (Diclofenac 3%, Lidocaine 5%, Menthol 1%     (Cream 100 Gm)).   Yes [provider]  NUCYNTA 50 MG TABS tablet Take 50 mg by mouth 3 (three) times daily as needed for moderate pain.  02/14/16  Yes  [provider]  pantoprazole (PROTONIX) 40 MG tablet Take 40 mg by mouth daily.  08/20/13  Yes Milton Ferguson, MD  potassium chloride SA (K-DUR,KLOR-CON) 20 MEQ tablet Take 1 tablet (20 mEq total) by mouth daily. 04/17/18  Yes Herminio Commons, MD  predniSONE (DELTASONE) 5 MG tablet Take 3.5 tabs x 2 wks., 3 x tabs x 2 wks., 2.5 tabs x 2 wks., 2 tabs daily Patient not taking: Reported on 05/01/2018 04/08/18   Bo Merino, MD    Review of Systems:  10 system reviewed and negative except as otherwise mentioned on HPI.   Physical Exam: Vitals:   05/01/18 1209 05/01/18 1342 05/01/18 2112 05/01/18 2135  BP: 103/64 110/60 132/83   Pulse: 99 (!) 103 99   Resp: 20 (!) 21 18   Temp: 98.1 F (36.7 C) 98.3 F (36.8 C) 98.6 F (37 C)   TempSrc: Oral Oral Oral   SpO2: 94% 90% 96% 94%  Weight: 127.5 kg (281 lb 1.4 oz)     Height: 5' (1.524 m)       Constitutional: obese, in NAD; reporting LLQ pain; no CP, no SOB. Eyes: PERRL, lids and conjunctivae normal, no icterus ENMT: Mucous membranes are moist. Posterior pharynx clear of any exudate or lesions.Normal dentition.  Neck: normal, supple, no masses, no thyromegaly Respiratory: clear to auscultation bilaterally, no wheezing, no crackles. Normal respiratory effort. No accessory muscle use.  Cardiovascular: Regular rate and rhythm, no murmurs / rubs / gallops. No extremity edema. 2+ pedal pulses. No carotid bruits.  Abdomen: obese, tender to palpation on her LLQ, no rebound, no guarding; Bowel sounds positive.  Musculoskeletal: no clubbing / cyanosis. No joint deformity upper and lower extremities. Good ROM, no contractures. Normal muscle tone.  Skin: no petechiae, open LE wounds in the setting of chronic venous stasis dermatitis; no signs of superimposed infection. Neurologic: CN 2-12 grossly intact. Sensation intact, DTR normal. Strength 5/5 in all 4.  Psychiatric: Normal judgment and insight. Alert and oriented x 3. Normal  mood.    Labs on Admission: I have personally reviewed the following labs and imaging studies  CBC: Recent Labs  Lab 04/30/18 1610 05/01/18 0619  WBC  --  16.5*  NEUTROABS  --  12.7*  HGB 11.1* 10.1*  HCT 32.5* 31.3*  MCV  --  91.8  PLT  --  725   Basic Metabolic Panel: Recent Labs  Lab 04/30/18 1104 05/01/18 0619  NA 141 139  K 3.5 3.8  CL 100 100  CO2 30 28  GLUCOSE 104* 130*  BUN 42* 44*  CREATININE 1.78* 2.15*  CALCIUM 9.4 8.9   GFR: Estimated Creatinine Clearance: 29.7 mL/min (A) (by C-G formula based on SCr of 2.15 mg/dL (H)).   Liver Function Tests: Recent Labs  Lab 05/01/18 0619  AST 19  ALT 21  ALKPHOS 68  BILITOT 0.9  PROT 6.4*  ALBUMIN 3.0*   Urine analysis:    Component Value Date/Time  COLORURINE YELLOW 04/16/2018 [place] 04/16/2018 1342   LABSPEC 1.013 04/16/2018 1342   PHURINE 5.0 04/16/2018 1342   GLUCOSEU NEGATIVE 04/16/2018 1342   HGBUR NEGATIVE 04/16/2018 1342   BILIRUBINUR NEGATIVE 04/16/2018 1342   KETONESUR NEGATIVE 04/16/2018 1342   PROTEINUR NEGATIVE 04/16/2018 1342   UROBILINOGEN 0.2 02/20/2014 1116   NITRITE NEGATIVE 04/16/2018 1342   LEUKOCYTESUR NEGATIVE 04/16/2018 1342    Radiological Exams on Admission: Ct Abdomen Pelvis Wo Contrast  Result Date: 05/01/2018 CLINICAL DATA:  Flank pain and rectal bleeding. Question abdominal aortic aneurysm. EXAM: CT ABDOMEN AND PELVIS WITHOUT CONTRAST TECHNIQUE: Multidetector CT imaging of the abdomen and pelvis was performed following the standard protocol without IV contrast. COMPARISON:  Abdominal CT 06/02/2016 FINDINGS: Lower chest: Mild right greater than left lung base atelectasis. No pleural fluid. No consolidation. Borderline cardiomegaly. Coronary artery calcifications. Hepatobiliary: Suggestion of nodular hepatic contours. No discrete focal lesion on noncontrast exam. Clips in the gallbladder fossa postcholecystectomy. No biliary dilatation. Pancreas: No ductal  dilatation or inflammation. Spleen: Normal in size without focal abnormality. Adrenals/Urinary Tract: No adrenal nodule. No hydronephrosis or perinephric edema. No urolithiasis. Both ureters are decompressed without stones along the course. Urinary bladder is partially distended, no bladder wall thickening. Stomach/Bowel: Inflamed diverticulum in the sigmoid colon with pericolonic edema and fat stranding consistent with diverticulitis. No perforation or abscess. Additional noninflamed diverticula from the splenic flexure distally. No small bowel wall thickening, inflammatory change or obstruction. Normal appendix. Stomach is nondistended, small hiatal hernia. Vascular/Lymphatic: Aortic atherosclerosis without aneurysm. Aortic tortuosity. No enlarged abdominal or pelvic lymph nodes. Reproductive: High-density 4 cm left adnexal structure may be a pedunculated fibroid or left ovary, this is unchanged in appearance from prior exam. Uterus is unremarkable. Right ovary is quiescent. Other: No free air, free fluid or intra-abdominal abscess. Small fat containing umbilical hernia. Musculoskeletal: Diffuse degenerative change throughout the spine with vacuum phenomenon and near of the level and prominent facet arthropathy. Moderate to advanced degenerative change of the left hip. There are no acute or suspicious osseous abnormalities. IMPRESSION: 1. Acute uncomplicated sigmoid diverticulitis. 2. Aortic atherosclerosis without aneurysm Aortic Atherosclerosis (ICD10-I70.0). Coronary artery calcifications, incidental. 3. Suggestion of nodular hepatic contours raising concern for cirrhosis. Recommend correlation with cirrhosis risk factors. Electronically Signed   By: Jeb Levering M.D.   On: 05/01/2018 06:55    EKG: none   Assessment/Plan 1-LLQ pain in the setting of Diverticulitis large intestine w/o perforation or abscess with BRBPR  -hemodynamically stable -complaining of nausea and LLQ pain -will give full  liquid diet, as mild bowel rest strategy -started on cipro and flagyl -PRN antiemetics -gentle IVF's -follow clinical response   2-lower GI bleed -in the setting of diverticulitis -will follow Hgb trend -hold heparin products and NSAID's  3-GERD (gastroesophageal reflux disease) -continue PPI  4-HTN (hypertension) -BP is stable, but slightly soft -will hold lisinopril, lasix and HCTZ for now -follow VS  5-Chronic pain syndrome -patient on Nucynta and gabapentin  -continue neurontin -will use dilaudid, dose equivalent; as hospital do not carry nucynta   6-Polymyalgia rheumatica (HCC) -continue prednisone tapering -continue Plaquenil   -continue outpatient follow up with rheumatology service   7-Morbid obesity with BMI of 50.0-59.9, adult ([place]) -discussion about portion control, low calorie diet and increase physical activity provided. -patient will benefit of evaluation at bariatric clinic   8-Chronic diastolic HF (heart failure) ([place]) -compensated -follow daily weights and strict intake and output -resume lasix and antihypertensive agents in am  9-acute  on CKD: patient with stage 3 at baseline -mild elevation on her Cr in the setting of mild dehydration and cotninue use of nephrotoxic agents -will hold diuretics and ACE overnight -gentle fluid resuscitation provided -will follow BMET in am.    DVT prophylaxis: SCD's Code Status: Full Code Family Communication: sister in law at bedside  Disposition Plan: home at discharge; hopefully in less than 2 midnights. Consults called: none  Admission status: med-surg, LOS < 2 midnights, observation status.   Time Spent: 70 minutes  Barton Dubois MD [facility] Pager [phone]  If 7PM-7AM, please contact night-coverage [email or web address] Password [facility]  05/01/2018, 9:46 PM

## 2018-05-01 NOTE — ED Triage Notes (Signed)
Pt from home brought in by [facility]. Pt C/O lower back pain that radiates to the left leg and groin. Pain started around 0400 this morning.

## 2018-05-01 NOTE — ED Notes (Signed)
Date and time results received: 05/01/18 0802 (use smartphrase ".now" to insert current time)  Test: Occult blood Critical Value: Positive  Name of Provider Notified: Wilson Singer  Orders Received? Or Actions Taken?: Orders Received - See Orders for details

## 2018-05-02 DIAGNOSIS — M4807 Spinal stenosis, lumbosacral region: Secondary | ICD-10-CM | POA: Diagnosis not present

## 2018-05-02 DIAGNOSIS — N183 Chronic kidney disease, stage 3 (moderate): Secondary | ICD-10-CM | POA: Diagnosis present

## 2018-05-02 DIAGNOSIS — K5733 Diverticulitis of large intestine without perforation or abscess with bleeding: Secondary | ICD-10-CM | POA: Diagnosis present

## 2018-05-02 DIAGNOSIS — E876 Hypokalemia: Secondary | ICD-10-CM | POA: Diagnosis present

## 2018-05-02 DIAGNOSIS — Z79899 Other long term (current) drug therapy: Secondary | ICD-10-CM | POA: Diagnosis not present

## 2018-05-02 DIAGNOSIS — D62 Acute posthemorrhagic anemia: Secondary | ICD-10-CM | POA: Diagnosis present

## 2018-05-02 DIAGNOSIS — M47816 Spondylosis without myelopathy or radiculopathy, lumbar region: Secondary | ICD-10-CM | POA: Diagnosis not present

## 2018-05-02 DIAGNOSIS — K5792 Diverticulitis of intestine, part unspecified, without perforation or abscess without bleeding: Secondary | ICD-10-CM | POA: Diagnosis present

## 2018-05-02 DIAGNOSIS — Z885 Allergy status to narcotic agent status: Secondary | ICD-10-CM | POA: Diagnosis not present

## 2018-05-02 DIAGNOSIS — R339 Retention of urine, unspecified: Secondary | ICD-10-CM | POA: Diagnosis present

## 2018-05-02 DIAGNOSIS — N179 Acute kidney failure, unspecified: Secondary | ICD-10-CM | POA: Diagnosis present

## 2018-05-02 DIAGNOSIS — K5700 Diverticulitis of small intestine with perforation and abscess without bleeding: Secondary | ICD-10-CM | POA: Diagnosis not present

## 2018-05-02 DIAGNOSIS — K219 Gastro-esophageal reflux disease without esophagitis: Secondary | ICD-10-CM | POA: Diagnosis not present

## 2018-05-02 DIAGNOSIS — Z9049 Acquired absence of other specified parts of digestive tract: Secondary | ICD-10-CM | POA: Diagnosis not present

## 2018-05-02 DIAGNOSIS — M6281 Muscle weakness (generalized): Secondary | ICD-10-CM | POA: Diagnosis not present

## 2018-05-02 DIAGNOSIS — E860 Dehydration: Secondary | ICD-10-CM | POA: Diagnosis present

## 2018-05-02 DIAGNOSIS — M5137 Other intervertebral disc degeneration, lumbosacral region: Secondary | ICD-10-CM | POA: Diagnosis not present

## 2018-05-02 DIAGNOSIS — M48062 Spinal stenosis, lumbar region with neurogenic claudication: Secondary | ICD-10-CM | POA: Diagnosis not present

## 2018-05-02 DIAGNOSIS — M545 Low back pain: Secondary | ICD-10-CM | POA: Diagnosis not present

## 2018-05-02 DIAGNOSIS — Z7952 Long term (current) use of systemic steroids: Secondary | ICD-10-CM | POA: Diagnosis not present

## 2018-05-02 DIAGNOSIS — R279 Unspecified lack of coordination: Secondary | ICD-10-CM | POA: Diagnosis not present

## 2018-05-02 DIAGNOSIS — I10 Essential (primary) hypertension: Secondary | ICD-10-CM | POA: Diagnosis not present

## 2018-05-02 DIAGNOSIS — Z880 Allergy status to penicillin: Secondary | ICD-10-CM | POA: Diagnosis not present

## 2018-05-02 DIAGNOSIS — Z888 Allergy status to other drugs, medicaments and biological substances status: Secondary | ICD-10-CM | POA: Diagnosis not present

## 2018-05-02 DIAGNOSIS — I5032 Chronic diastolic (congestive) heart failure: Secondary | ICD-10-CM | POA: Diagnosis not present

## 2018-05-02 DIAGNOSIS — M353 Polymyalgia rheumatica: Secondary | ICD-10-CM | POA: Diagnosis not present

## 2018-05-02 DIAGNOSIS — Z6841 Body Mass Index (BMI) 40.0 and over, adult: Secondary | ICD-10-CM | POA: Diagnosis not present

## 2018-05-02 DIAGNOSIS — G894 Chronic pain syndrome: Secondary | ICD-10-CM | POA: Diagnosis not present

## 2018-05-02 DIAGNOSIS — D631 Anemia in chronic kidney disease: Secondary | ICD-10-CM | POA: Diagnosis present

## 2018-05-02 DIAGNOSIS — K5732 Diverticulitis of large intestine without perforation or abscess without bleeding: Secondary | ICD-10-CM | POA: Diagnosis not present

## 2018-05-02 DIAGNOSIS — D500 Iron deficiency anemia secondary to blood loss (chronic): Secondary | ICD-10-CM | POA: Diagnosis not present

## 2018-05-02 DIAGNOSIS — Z7401 Bed confinement status: Secondary | ICD-10-CM | POA: Diagnosis not present

## 2018-05-02 DIAGNOSIS — R262 Difficulty in walking, not elsewhere classified: Secondary | ICD-10-CM | POA: Diagnosis not present

## 2018-05-02 DIAGNOSIS — I130 Hypertensive heart and chronic kidney disease with heart failure and stage 1 through stage 4 chronic kidney disease, or unspecified chronic kidney disease: Secondary | ICD-10-CM | POA: Diagnosis present

## 2018-05-02 DIAGNOSIS — Z882 Allergy status to sulfonamides status: Secondary | ICD-10-CM | POA: Diagnosis not present

## 2018-05-02 LAB — BASIC METABOLIC PANEL
Anion gap: 9 (ref 5–15)
BUN: 33 mg/dL — ABNORMAL HIGH (ref 8–23)
CO2: 26 mmol/L (ref 22–32)
Calcium: 8.8 mg/dL — ABNORMAL LOW (ref 8.9–10.3)
Chloride: 105 mmol/L (ref 98–111)
Creatinine, Ser: 1.56 mg/dL — ABNORMAL HIGH (ref 0.44–1.00)
GFR calc Af Amer: 37 mL/min — ABNORMAL LOW (ref >60)
GFR calc non Af Amer: 32 mL/min — ABNORMAL LOW (ref >60)
Glucose, Bld: 100 mg/dL — ABNORMAL HIGH (ref 70–99)
Potassium: 3.8 mmol/L (ref 3.5–5.1)
Sodium: 140 mmol/L (ref 135–145)

## 2018-05-02 LAB — CBC
HCT: 27.7 % — ABNORMAL LOW (ref 36.0–46.0)
Hemoglobin: 9.2 g/dL — ABNORMAL LOW (ref 12.0–15.0)
MCH: 30.2 pg (ref 26.0–34.0)
MCHC: 33.2 g/dL (ref 30.0–36.0)
MCV: 90.8 fL (ref 78.0–100.0)
Platelets: 296 10*3/uL (ref 150–400)
RBC: 3.05 MIL/uL — ABNORMAL LOW (ref 3.87–5.11)
RDW: 15 % (ref 11.5–15.5)
WBC: 18.2 10*3/uL — ABNORMAL HIGH (ref 4.0–10.5)

## 2018-05-02 MED ORDER — HYDROMORPHONE HCL 1 MG/ML IJ SOLN
0.7500 mg | Freq: Once | INTRAMUSCULAR | Status: AC
  Administered 2018-05-02: 0.75 mg via INTRAVENOUS
  Filled 2018-05-02: qty 1

## 2018-05-02 MED ORDER — HYDROMORPHONE HCL 1 MG/ML IJ SOLN: 1.0000 mg | INTRAMUSCULAR | Status: DC | PRN

## 2018-05-02 MED ORDER — POLYVINYL ALCOHOL 1.4 % OP SOLN
1.0000 [drp] | OPHTHALMIC | Status: DC | PRN
  Administered 2018-05-02: 1 [drp] via OPHTHALMIC
  Filled 2018-05-02: qty 15

## 2018-05-02 MED ORDER — TAPENTADOL HCL 50 MG PO TABS
50.0000 mg | ORAL_TABLET | Freq: Three times a day (TID) | ORAL | Status: DC | PRN
  Administered 2018-05-02 – 2018-05-06 (×10): 50 mg via ORAL

## 2018-05-02 NOTE — Care Management Obs Status (Signed)
[place] NOTIFICATION   Patient Details  Name: Nicole [name] MRN: [id number] Date of Birth: 1945-11-14   Medicare Observation Status Notification Given:  Yes    Charlyne Robertshaw, Chauncey Reading, RN 05/02/2018, 12:49 PM

## 2018-05-02 NOTE — Progress Notes (Signed)
PROGRESS NOTE    Nicole [name]  RUE:[id number] DOB: [birth year] DOA: 05/01/2018 PCP: Sharilyn Sites, MD    Brief Narrative:  72 y.o. female with significant PMH for HTN, CKD stage 3, chronic diastolic HF, morbid Obesity, GERD, diverticulosis and PMR (on chronic prednisone); who presented to ED with complaints of abd pain, anorexia and nausea. Symptoms has been present for the last week or so; patient reported pain as intermittent, crampy in nature, localized in her LLQ, no radiating, associated with nausea (but no vomiting) and anorexia. She expressed pain at its worst is 8-9/10 and was worsen by eating. Patient also reported intermittent episodes of BRBPR. She denies CP, SOB, HA's, focal weakness, dysuria, hematemesis and hematuria.   Assessment & Plan: 1-Diverticulitis large intestine w/o perforation or abscess w bleeding -continue to follow Hgb trend -transfusion if needed -continue current IV antibiotics, PRN antiemetics and analgesics -continue full liquid diet  -follow clinical response   2-Acute on chronic anemia -in the setting of diverticulitis -no transfusion needed at this moment -follow Hgb trend   3-GERD (gastroesophageal reflux disease) -continue PPI  4-HTN (hypertension) -stable, will slowly resume antihypertensive regimen. -follow sodium in her diet  5-Chronic pain syndrome -resume Nucynta as per home orders -PRN IV dilaudid for severe pain no reloved with oral     6-Polymyalgia rheumatica ([place]) Resume steroids  7-Morbid obesity with BMI of 50.0-59.9, adult (HCC) -low calorie diet and portion control discussed with patient. -will benefit of eval at bariatric clinic.  8-Chronic diastolic HF (heart failure) (HCC) -overall compensated -resume diuretics -low sodium diet encouraged   9-acute on chronic renal failure -Cr stable/improved -follow trend -continu minimizing/avoiding nephrotoxic agents -follow trend   DVT prophylaxis: SCD's Code Status: Full  Code Family Communication: Son by phone  Disposition Plan: remains inpatient, continue IV antibiotics, adjust analgesic regimen; continue full liquid diet and PRN antiemetics.   Consultants:   None   Procedures:   See below for x-ray reports   Antimicrobials:  Anti-infectives (From admission, onward)   Start     Dose/Rate Route Frequency Ordered Stop   05/01/18 2200  ciprofloxacin (CIPRO) IVPB 400 mg     400 mg 200 mL/hr over 60 Minutes Intravenous Every 12 hours 05/01/18 1015     05/01/18 2200  hydroxychloroquine (PLAQUENIL) tablet 200 mg    Note to Pharmacy:  TAKE 1 TABLET IN THE MORNING AND 1 TABLET IN THE EVENING, Long. NONE ON SATURDAY OR SUNDAY     200 mg Oral 2 times per day on Mon Tue Wed Thu Fri 05/01/18 2138     05/01/18 1600  metroNIDAZOLE (FLAGYL) IVPB 500 mg     500 mg 100 mL/hr over 60 Minutes Intravenous Every 8 hours 05/01/18 1015     05/01/18 0830  metroNIDAZOLE (FLAGYL) IVPB 500 mg     500 mg 100 mL/hr over 60 Minutes Intravenous  Once 05/01/18 0815 05/01/18 0938   05/01/18 0830  ciprofloxacin (CIPRO) IVPB 400 mg     40 0 mg 200 mL/hr over 60 Minutes Intravenous  Once 05/01/18 0815 05/01/18 1039      Subjective: Denies vomiting; still having some nausea and with severe LLQ pain. No CP, no SOB.  Objective: Vitals:   05/02/18 0500 05/02/18 1320 05/02/18 1509 05/02/18 2102  BP: 136/88  140/66   Pulse: 88  97   Resp: 20  20   Temp: 98.4 F (36.9 C)  98.5 F (36.9 C)   TempSrc: Oral  Oral  SpO2: 96%  91% 95%  Weight:  127.1 kg (280 lb 3.3 oz)    Height:        Intake/Output Summary (Last 24 hours) at 05/02/2018 2207 Last data filed at 05/02/2018 2104 Gross per 24 hour  Intake 1503 ml  Output 0 ml  Net 1503 ml   Filed Weights   05/01/18 1209 05/02/18 1320  Weight: 127.5 kg (281 lb 1.4 oz) 127.1 kg (280 lb 3.3 oz)    Examination: General exam: Alert, awake, oriented x 3; afebrile; still having LLQ pain and reporting some  BRBPR (even she expressed a whole lot less) Respiratory system: Clear to auscultation. Respiratory effort normal. Cardiovascular system:RRR. No murmurs, rubs, gallops. Gastrointestinal system: Abdomen is nondistended, soft and tender to palpation on her LLQ; No organomegaly or masses felt. Normal bowel sounds heard. Central nervous system: Alert and oriented. No focal neurological deficits. Extremities: No Cyanosis; positive venous stasis dermatitis and non superinfected wounds. 1+ edema bilaterally. Skin: No rashes, lesions or ulcers Psychiatry: Judgement and insight appear normal. Mood & affect appropriate.    Data Reviewed: I have personally reviewed following labs and imaging studies  CBC: Recent Labs  Lab 04/30/18 1610 05/01/18 0619 05/02/18 0517  WBC  --  16.5* 18.2*  NEUTROABS  --  12.7*  --   HGB 11.1* 10.1* 9.2*  HCT 32.5* 31.3* 27.7*  MCV  --  91.8 90.8  PLT  --  297 625   Basic Metabolic Panel: Recent Labs  Lab 04/30/18 1104 05/01/18 0619 05/02/18 0517  NA 141 139 140  K 3.5 3.8 3.8  CL 100 100 105  CO2 30 28 26   GLUCOSE 104* 130* 100*  BUN 42* 44* 33*  CREATININE 1.78* 2.15* 1.56*  CALCIUM 9.4 8.9 8.8*   GFR: Estimated Creatinine Clearance: 40.8 mL/min (A) (by C-G formula based on SCr of 1.56 mg/dL (H)).   Liver Function Tests: Recent Labs  Lab 05/01/18 0619  AST 19  ALT 21  ALKPHOS 68  BILITOT 0.9  PROT 6.4*  ALBUMIN 3.0*   Urine analysis:    Component Value Date/Time   COLORURINE YELLOW 04/16/2018 1342   APPEARANCEUR CLEAR 04/16/2018 1342   LABSPEC 1.013 04/16/2018 1342   PHURINE 5.0 04/16/2018 1342   GLUCOSEU NEGATIVE 04/16/2018 1342   HGBUR NEGATIVE 04/16/2018 1342   BILIRUBINUR NEGATIVE 04/16/2018 1342   KETONESUR NEGATIVE 04/16/2018 1342   PROTEINUR NEGATIVE 04/16/2018 1342   UROBILINOGEN 0.2 02/20/2014 1116   NITRITE NEGATIVE 04/16/2018 1342   LEUKOCYTESUR NEGATIVE 04/16/2018 1342    Radiology Studies: Ct Abdomen Pelvis Wo  Contrast  Result Date: 05/01/2018 CLINICAL DATA:  Flank pain and rectal bleeding. Question abdominal aortic aneurysm. EXAM: CT ABDOMEN AND PELVIS WITHOUT CONTRAST TECHNIQUE: Multidetector CT imaging of the abdomen and pelvis was performed following the standard protocol without IV contrast. COMPARISON:  Abdominal CT 06/02/2016 FINDINGS: Lower chest: Mild right greater than left lung base atelectasis. No pleural fluid. No consolidation. Borderline cardiomegaly. Coronary artery calcifications. Hepatobiliary: Suggestion of nodular hepatic contours. No discrete focal lesion on noncontrast exam. Clips in the gallbladder fossa postcholecystectomy. No biliary dilatation. Pancreas: No ductal dilatation or inflammation. Spleen: Normal in size without focal abnormality. Adrenals/Urinary Tract: No adrenal nodule. No hydronephrosis or perinephric edema. No urolithiasis. Both ureters are decompressed without stones along the course. Urinary bladder is partially distended, no bladder wall thickening. Stomach/Bowel: Inflamed diverticulum in the sigmoid colon with pericolonic edema and fat stranding consistent with diverticulitis. No perforation or abscess. Additional noninflamed diverticula  from the splenic flexure distally. No small bowel wall thickening, inflammatory change or obstruction. Normal appendix. Stomach is nondistended, small hiatal hernia. Vascular/Lymphatic: Aortic atherosclerosis without aneurysm. Aortic tortuosity. No enlarged abdominal or pelvic lymph nodes. Reproductive: High-density 4 cm left adnexal structure may be a pedunculated fibroid or left ovary, this is unchanged in appearance from prior exam. Uterus is unremarkable. Right ovary is quiescent. Other: No free air, free fluid or intra-abdominal abscess. Small fat containing umbilical hernia. Musculoskeletal: Diffuse degenerative change throughout the spine with vacuum phenomenon and near of the level and prominent facet arthropathy. Moderate to advanced  degenerative change of the left hip. There are no acute or suspicious osseous abnormalities. IMPRESSION: 1. Acute uncomplicated sigmoid diverticulitis. 2. Aortic atherosclerosis without aneurysm Aortic Atherosclerosis (ICD10-I70.0). Coronary artery calcifications, incidental. 3. Suggestion of nodular hepatic contours raising concern for cirrhosis. Recommend correlation with cirrhosis risk factors. Electronically Signed   By: Jeb Levering M.D.   On: 05/01/2018 06:55    Scheduled Meds: . furosemide  40 mg Oral Daily  . gabapentin  200 mg Oral TID  . hydroxychloroquine  200 mg Oral 2 times per day on Mon Tue Wed Thu Fri  . pantoprazole  40 mg Oral Daily  . potassium chloride SA  20 mEq Oral Daily  . predniSONE  15 mg Oral Q breakfast  . sodium chloride flush  3 mL Intravenous Q12H   Continuous Infusions: . sodium chloride    . ciprofloxacin Stopped (05/02/18 2203)  . metronidazole 500 mg (05/02/18 1711)     LOS: 0 days    Time spent: 30 minutes.   Barton Dubois, MD [facility] Pager [phone]  If 7PM-7AM, please contact night-coverage [email or web address] Password [facility] 05/02/2018, 10:07 PM

## 2018-05-03 MED ORDER — METRONIDAZOLE 500 MG PO TABS
500.0000 mg | ORAL_TABLET | Freq: Three times a day (TID) | ORAL | Status: DC
  Administered 2018-05-03 – 2018-05-06 (×9): 500 mg via ORAL
  Filled 2018-05-03 (×9): qty 1

## 2018-05-03 MED ORDER — METHOCARBAMOL 500 MG PO TABS
500.0000 mg | ORAL_TABLET | Freq: Three times a day (TID) | ORAL | Status: DC | PRN
  Administered 2018-05-03 – 2018-05-04 (×3): 500 mg via ORAL
  Filled 2018-05-03 (×3): qty 1

## 2018-05-03 MED ORDER — CIPROFLOXACIN HCL 250 MG PO TABS
500.0000 mg | ORAL_TABLET | Freq: Two times a day (BID) | ORAL | Status: DC
Start: 2018-05-03 — End: 2018-05-06
  Administered 2018-05-03 – 2018-05-06 (×6): 500 mg via ORAL
  Filled 2018-05-03 (×6): qty 2

## 2018-05-03 NOTE — Plan of Care (Signed)
  Problem: Acute Rehab PT Goals(only PT should resolve) Goal: Pt Will Go Supine/Side To Sit Outcome: Progressing Flowsheets (Taken 05/03/2018 1448) Pt will go Supine/Side to Sit: with supervision Goal: Patient Will Transfer Sit To/From Stand Outcome: Progressing Flowsheets (Taken 05/03/2018 1448) Patient will transfer sit to/from stand: with supervision Goal: Pt Will Transfer Bed To Chair/Chair To Bed Outcome: Progressing Flowsheets (Taken 05/03/2018 1448) Pt will Transfer Bed to Chair/Chair to Bed: with supervision Goal: Pt Will Ambulate Outcome: Progressing Flowsheets (Taken 05/03/2018 1448) Pt will Ambulate: 50 feet;with supervision;with rolling walker   2:49 PM, 05/03/18 Lonell Grandchild, MPT Physical Therapist with [facility] [phone] office [phone] mobile phone

## 2018-05-03 NOTE — Clinical Social Work Note (Addendum)
LCSW spoke with patient regarding possible placement private pay as patient does not have the required Medicare three midnight inpatient stay. Patient states that she would rather go home.  She stated that she has overcome a lot in her life and feels like she can continue with HHPT that is already in place.  LCSW spoke with Georgia Duff, son, and discussed situation. He was agreeable to continued HHPT as patient did not have the required three midnight hospital stay.    LCSW signing off.     Hlee Fringer, Clydene Pugh, LCSW

## 2018-05-03 NOTE — Care Management Note (Signed)
Case Management Note  Patient Details  Name: Nicole [name] MRN: [id number] Date of Birth: 02-Jan-1946  Subjective/Objective:   Diverticulitis. From home alone. Has Rollator. Active with [facility] for PT and RN services. Has a brother next door who care for their elderly mother. Potential DC today. Patient repots she would like to stay again tonight because she is having leg cramps.  Bedside RN has updated attending.  PT ordered. Patient reports she would not want to go to SNF but doesn't feel she can go home today, "just need time to get going".  Discussed with RN, she reports she has tried to get patient up to chair to help with cramps/pain and patient declines.                   Action/Plan: Left VM for Sharyn Lull of [facility]. Home health orders are available via Epic.  PT eval pending.    Expected Discharge Date:      05/03/2018            Expected Discharge Plan:  [place]  In-House Referral:     Discharge planning Services  CM Consult  Post Acute Care Choice:  Home Health, Resumption of Svcs/PTA Provider Choice offered to:     DME Arranged:    DME Agency:     HH Arranged:    [place] Agency:   [facility]  Status of Service:  Completed, signed off  If discussed at [place] of Stay Meetings, dates discussed:    Additional Comments:  Kendryck Lacroix, Chauncey Reading, RN 05/03/2018, 1:06 PM

## 2018-05-03 NOTE — Progress Notes (Signed)
PROGRESS NOTE    Nicole [name]  VEH:[id number] DOB: 08-28-1946 DOA: 05/01/2018 PCP: Sharilyn Sites, MD    Brief Narrative:  72 y.o. female with significant PMH for HTN, CKD stage 3, chronic diastolic HF, morbid Obesity, GERD, diverticulosis and PMR (on chronic prednisone); who presented to ED with complaints of abd pain, anorexia and nausea. Symptoms has been present for the last week or so; patient reported pain as intermittent, crampy in nature, localized in her LLQ, no radiating, associated with nausea (but no vomiting) and anorexia. She expressed pain at its worst is 8-9/10 and was worsen by eating. Patient also reported intermittent episodes of BRBPR. She denies CP, SOB, HA's, focal weakness, dysuria, hematemesis and hematuria.   Assessment & Plan: 1-Diverticulitis large intestine w/o perforation or abscess w bleeding -continue to follow Hgb trend -transfusion if needed -continue antibiotics; but will switch to PO -advance diet to soft diet -no further bleeding appreciated -still having some pain in her LLQ -continue PRN analgesics.  2-Acute on chronic anemia -in the setting of diverticulitis -no transfusion needed at this moment -follow Hgb trend   3-GERD (gastroesophageal reflux disease) -will continue PPI  4-HTN (hypertension) -stable, overall -continue to resume home regimen slowly. -follow sodium in her diet  5-Chronic pain syndrome -continue Nucynta as per home orders -PRN IV dilaudid for severe pain no relieved with oral regimen. -will also add robaxin    6-Polymyalgia rheumatica (HCC) -continue steroids, patient on tapering process -using 15 mg daily for 2 weeks currently.  7-Morbid obesity with BMI of 50.0-59.9, adult (HCC) -low calorie diet and portion control discussed with patient. -will benefit of eval at bariatric clinic.  8-Chronic diastolic HF (heart failure) (HCC) -has remained compensated -continue lasix -low sodium diet encouraged  -follow  daily weights  9-acute on chronic renal failure -Cr stable/improved after gentle IVF's and holding diuretics. -follow trend -continue minimizing/avoiding nephrotoxic agents  10-Physical deconditioning -patient declining SNF -will attempt better control of pain and resume [place] services at discharge.  DVT prophylaxis: SCD's Code Status: Full Code Family Communication: Son by phone  Disposition Plan: remains inpatient, continue IV antibiotics, adjust analgesic regimen; continue full liquid diet and PRN antiemetics.   Consultants:   None   Procedures:   See below for x-ray reports   Antimicrobials:  Anti-infectives (From admission, onward)   Start     Dose/Rate Route Frequency Ordered Stop   05/03/18 2200  metroNIDAZOLE (FLAGYL) tablet 500 mg     500 mg Oral Every 8 hours 05/03/18 1808     05/03/18 2000  ciprofloxacin (CIPRO) tablet 500 mg     500 mg Oral 2 times daily 05/03/18 1808     05/01/18 2200  ciprofloxacin (CIPRO) IVPB 400 mg  Status:  Discontinued     400 mg 200 mL/hr over 60 Minutes Intravenous Every 12 hours 05/01/18 1015 05/03/18 1808   05/01/18 2200  hydroxychloroquine (PLAQUENIL) tablet 200 mg    Note to Pharmacy:  TAKE 1 TABLET IN THE MORNING AND 1 TABLET IN THE EVENING, [place]. NONE ON SATURDAY OR SUNDAY     200 mg Oral 2 times per day on Mon Tue Wed Thu Fri 05/01/18 2138     05/01/18 1600  metroNIDAZOLE (FLAGYL) IVPB 500 mg  Status:  Discontinued     500 mg 100 mL/hr over 60 Minutes Intravenous Every 8 hours 05/01/18 1015 05/03/18 1808   05/01/18 0830  metroNIDAZOLE (FLAGYL) IVPB 500 mg     50 0 mg 100 mL/hr  over 60 Minutes Intravenous  Once 05/01/18 0815 05/01/18 0938   05/01/18 0830  ciprofloxacin (CIPRO) IVPB 400 mg     400 mg 200 mL/hr over 60 Minutes Intravenous  Once 05/01/18 0815 05/01/18 1039      Subjective: No nausea, no vomiting, tolerating diet and with improvement in abd pain. Complaining of severe lower back and left  crampy/spasm pain. Having trouble walking.  Objective: Vitals:   05/02/18 1509 05/02/18 2102 05/03/18 0612 05/03/18 1330  BP: 140/66  96/64 114/76  Pulse: 97  86 91  Resp: 20  19 18   Temp: 98.5 F (36.9 C)  97.9 F (36.6 C) 98.4 F (36.9 C)  TempSrc: Oral  Oral Oral  SpO2: 91% 95% 95% 94%  Weight:   128 kg (282 lb 3 oz)   Height:        Intake/Output Summary (Last 24 hours) at 05/03/2018 1907 Last data filed at 05/03/2018 1800 Gross per 24 hour  Intake 723 ml  Output 1600 ml  Net -877 ml   Filed Weights   05/01/18 1209 05/02/18 1320 05/03/18 0612  Weight: 127.5 kg (281 lb 1.4 oz) 127.1 kg (280 lb 3.3 oz) 128 kg (282 lb 3 oz)    Examination: General exam: Alert, awake, oriented x 3; no fever, no nausea, no vomiting. Reports lower back pain and left side cramps/spasm. Respiratory system: Clear to auscultation. Respiratory effort normal. Cardiovascular system:RRR. No murmurs, rubs, gallops. Gastrointestinal system: Abdomen is nondistended, soft and just mildly tender to palpation on her LLQ. No organomegaly or masses felt. Normal bowel sounds heard. Central nervous system: Alert and oriented. No focal neurological deficits. Extremities: No Cyanosis, no clubbing, positive 1 + edema bilaterally.  Skin: positive venous stasis dermatitis changes on her legs; no induration, no superimposed infection. Psychiatry: Judgement and insight appear normal. Mood & affect appropriate.    Data Reviewed: I have personally reviewed following labs and imaging studies  CBC: Recent Labs  Lab 04/30/18 1610 05/01/18 0619 05/02/18 0517  WBC  --  16.5* 18.2*  NEUTROABS  --  12.7*  --   HGB 11.1* 10.1* 9.2*  HCT 32.5* 31.3* 27.7*  MCV  --  91.8 90.8  PLT  --  297 767   Basic Metabolic Panel: Recent Labs  Lab 04/30/18 1104 05/01/18 0619 05/02/18 0517  NA 141 139 140  K 3.5 3.8 3.8  CL 100 100 105  CO2 30 28 26   GLUCOSE 104* 130* 100*  BUN 42* 44* 33*  CREATININE 1.78* 2.15* 1.56*    CALCIUM 9.4 8.9 8.8*   GFR: Estimated Creatinine Clearance: 41 mL/min (A) (by C-G formula based on SCr of 1.56 mg/dL (H)).   Liver Function Tests: Recent Labs  Lab 05/01/18 0619  AST 19  ALT 21  ALKPHOS 68  BILITOT 0.9  PROT 6.4*  ALBUMIN 3.0*   Urine analysis:    Component Value Date/Time   COLORURINE YELLOW 04/16/2018 1342   APPEARANCEUR CLEAR 04/16/2018 1342   LABSPEC 1.013 04/16/2018 1342   PHURINE 5.0 04/16/2018 1342   GLUCOSEU NEGATIVE 04/16/2018 1342   HGBUR NEGATIVE 04/16/2018 1342   BILIRUBINUR NEGATIVE 04/16/2018 1342   KETONESUR NEGATIVE 04/16/2018 1342   PROTEINUR NEGATIVE 04/16/2018 1342   UROBILINOGEN 0.2 02/20/2014 1116   NITRITE NEGATIVE 04/16/2018 1342   LEUKOCYTESUR NEGATIVE 04/16/2018 1342    Radiology Studies: No results found.  Scheduled Meds: . ciprofloxacin  500 mg Oral BID  . furosemide  40 mg Oral Daily  . gabapentin  200 mg Oral TID  . hydroxychloroquine  200 mg Oral 2 times per day on Mon Tue Wed Thu Fri  . metroNIDAZOLE  500 mg Oral Q8H  . pantoprazole  40 mg Oral Daily  . potassium chloride SA  20 mEq Oral Daily  . predniSONE  15 mg Oral Q breakfast  . sodium chloride flush  3 mL Intravenous Q12H   Continuous Infusions: . sodium chloride       LOS: 1 day    Time spent: 30 minutes.   Barton Dubois, MD [facility] Pager [phone]  If 7PM-7AM, please contact night-coverage [email or web address] Password [facility] 05/03/2018, 7:07 PM

## 2018-05-03 NOTE — Evaluation (Signed)
Physical Therapy Evaluation Patient Details Name: Nicole [name] MRN: [id number] DOB: 01-Sep-1946 Today's Date: 05/03/2018   History of Present Illness  Nicole [name] is a 72 y.o. female with significant PMH for HTN, CKD stage 3, chronic diastolic HF, morbid Obesity, GERD, diverticulosis and PMR (on chronic prednisone); who presented to ED with complaints of abd pain, anorexia and nausea. Symptoms has been present for the last week or so; patient reported pain as intermittent, crampy in nature, localized in her LLQ, no radiating, associated with nausea (but no vomiting) and anorexia. She expressed pain at its worst is 8-9/10 and was worsen by eating. Patient also reported intermittent episodes of BRBPR.    Clinical Impression  Patient demonstrates slow labored movement for bed mobility , transfers and walking to bathroom, c/o severe LLE pain after sit to stand from commode in bathroom, c/o cramping like sensation and unable to attempt walking outside of room.  Patient tolerated sitting up in chair approximately 15 minutes before requesting to go back to bed due to LLE discomfort - RN/MD aware.  Patient will benefit from continued physical therapy in hospital and recommended venue below to increase strength, balance, endurance for safe ADLs and gait.  Patient suffers from polymyalgia, polyarthralgia, lumbar stenosis and chronic low back  which impairs their ability to perform daily activities like prolonged standing, walking and completing daily ADLs in the home.  A rollator walker alone will not resolve the issues with performing activities of daily living. A wheelchair will allow patient to safely perform daily activities.  The patient can self propel in the home.     Follow Up Recommendations SNF;Supervision for mobility/OOB    Equipment Recommendations  None recommended by PT;Wheelchair (measurements PT);Wheelchair cushion (measurements PT)    Recommendations for Other Services       Precautions  / Restrictions Precautions Precautions: Fall Restrictions Weight Bearing Restrictions: No      Mobility  Bed Mobility Overal bed mobility: Needs Assistance Bed Mobility: Supine to Sit;Sit to Supine     Supine to sit: Supervision Sit to supine: Min assist   General bed mobility comments: had to use siderails for sitting up and required assistance to help move LLE back onto bed  Transfers Overall transfer level: Needs assistance Equipment used: Rolling walker (2 wheeled) Transfers: Sit to/from [facility] Sit to Stand: Supervision Stand pivot transfers: Min guard       General transfer comment: slow labored movement  Ambulation/Gait Ambulation/Gait assistance: Min guard Gait Distance (Feet): 15 Feet Assistive device: Rolling walker (2 wheeled) Gait Pattern/deviations: Decreased step length - right;Decreased step length - left;Decreased stride length Gait velocity: slow   General Gait Details: demonstrates slow labored movement with leaning over RW, required VC's for proper hand placement and limited mostly due to c/o LLE pain  Stairs            Wheelchair Mobility    Modified Rankin (Stroke Patients Only)       Balance Overall balance assessment: Needs assistance Sitting-balance support: Feet supported;No upper extremity supported Sitting balance-Leahy Scale: Good     Standing balance support: Bilateral upper extremity supported;During functional activity Standing balance-Leahy Scale: Fair                               Pertinent Vitals/Pain Pain Assessment: 0-10 Pain Score: 5  Pain Location: low back, mostly spasming like pain in LLE from hip down to knee, severe LLE after getting  up from commode Pain Descriptors / Indicators: Grimacing;Guarding;Discomfort Pain Intervention(s): Limited activity within patient's tolerance;Monitored during session    [place] expects to be discharged to:: Private  residence Living Arrangements: Alone Available Help at Discharge: Family Type of Home: House Home Access: Stairs to enter Entrance Stairs-Rails: None Entrance Stairs-Number of Steps: 1 step up onto the porch, then a threshold into the house Home Layout: One level Home Equipment: Environmental consultant - 4 wheels;Bedside commode      Prior Function Level of Independence: Needs assistance   Gait / Transfers Assistance Needed: household ambulator with ROLLATOR  ADL's / Homemaking Assistance Needed: assisted by her brother        Hand Dominance   Dominant Hand: Right    Extremity/Trunk Assessment   Upper Extremity Assessment Upper Extremity Assessment: Generalized weakness    Lower Extremity Assessment Lower Extremity Assessment: Generalized weakness    Cervical / Trunk Assessment Cervical / Trunk Assessment: Normal  Communication   Communication: No difficulties  Cognition Arousal/Alertness: Awake/alert Behavior During Therapy: WFL for tasks assessed/performed Overall Cognitive Status: Within Functional Limits for tasks assessed                                        General Comments      Exercises     Assessment/Plan    PT Assessment Patient needs continued PT services  PT Problem List Decreased strength;Decreased activity tolerance;Decreased balance;Decreased mobility       PT Treatment Interventions Gait training;Functional mobility training;Therapeutic activities;Stair training;Therapeutic exercise;Patient/family education    PT Goals (Current goals can be found in the Care Plan section)  Acute Rehab PT Goals Patient Stated Goal: return home with family to assist PT Goal Formulation: With patient Time For Goal Achievement: 05/17/18 Potential to Achieve Goals: Good    Frequency Min 3X/week   Barriers to discharge        Co-evaluation               AM-PAC PT "6 Clicks" Daily Activity  Outcome Measure Difficulty turning over in bed  (including adjusting bedclothes, sheets and blankets)?: None Difficulty moving from lying on back to sitting on the side of the bed? : A Little Difficulty sitting down on and standing up from a chair with arms (e.g., wheelchair, bedside commode, etc,.)?: A Little Help needed moving to and from a bed to chair (including a wheelchair)?: A Little Help needed walking in hospital room?: A Little Help needed climbing 3-5 steps with a railing? : A Lot 6 Click Score: 18    End of Session   Activity Tolerance: Patient tolerated treatment well;Patient limited by pain;Patient limited by fatigue Patient left: in bed;with call bell/phone within reach Nurse Communication: Mobility status PT Visit Diagnosis: Unsteadiness on feet (R26.81);Other abnormalities of gait and mobility (R26.89);Muscle weakness (generalized) (M62.81)    Time: 9604-5409 PT Time Calculation (min) (ACUTE ONLY): 31 min   Charges:   PT Evaluation $PT Eval Moderate Complexity: 1 Mod PT Treatments $Therapeutic Activity: 23-37 mins   PT G Codes:        2:44 PM, 2018/05/25 Lonell Grandchild, MPT Physical Therapist with [facility] 336 [phone] office [phone] mobile phone

## 2018-05-04 LAB — CBC
HCT: 28.9 % — ABNORMAL LOW (ref 36.0–46.0)
Hemoglobin: 9.4 g/dL — ABNORMAL LOW (ref 12.0–15.0)
MCH: 29.5 pg (ref 26.0–34.0)
MCHC: 32.5 g/dL (ref 30.0–36.0)
MCV: 90.6 fL (ref 78.0–100.0)
Platelets: 324 10*3/uL (ref 150–400)
RBC: 3.19 MIL/uL — ABNORMAL LOW (ref 3.87–5.11)
RDW: 14.8 % (ref 11.5–15.5)
WBC: 10.6 10*3/uL — ABNORMAL HIGH (ref 4.0–10.5)

## 2018-05-04 LAB — BASIC METABOLIC PANEL
Anion gap: 9 (ref 5–15)
BUN: 20 mg/dL (ref 8–23)
CO2: 28 mmol/L (ref 22–32)
Calcium: 8.9 mg/dL (ref 8.9–10.3)
Chloride: 103 mmol/L (ref 98–111)
Creatinine, Ser: 1.47 mg/dL — ABNORMAL HIGH (ref 0.44–1.00)
GFR calc Af Amer: 40 mL/min — ABNORMAL LOW (ref >60)
GFR calc non Af Amer: 35 mL/min — ABNORMAL LOW (ref >60)
Glucose, Bld: 88 mg/dL (ref 70–99)
Potassium: 3.1 mmol/L — ABNORMAL LOW (ref 3.5–5.1)
Sodium: 140 mmol/L (ref 135–145)

## 2018-05-04 LAB — MAGNESIUM: Magnesium: 1.5 mg/dL — ABNORMAL LOW (ref 1.7–2.4)

## 2018-05-04 MED ORDER — MAGNESIUM SULFATE 2 GM/50ML IV SOLN
2.0000 g | Freq: Once | INTRAVENOUS | Status: AC
  Administered 2018-05-04: 2 g via INTRAVENOUS
  Filled 2018-05-04: qty 50

## 2018-05-04 MED ORDER — HYDROMORPHONE HCL 2 MG PO TABS
2.0000 mg | ORAL_TABLET | Freq: Three times a day (TID) | ORAL | Status: DC | PRN
  Administered 2018-05-04 – 2018-05-06 (×5): 2 mg via ORAL
  Filled 2018-05-04 (×6): qty 1

## 2018-05-04 MED ORDER — POTASSIUM CHLORIDE CRYS ER 20 MEQ PO TBCR
40.0000 meq | EXTENDED_RELEASE_TABLET | Freq: Every day | ORAL | Status: DC
  Administered 2018-05-04 – 2018-05-05 (×2): 40 meq via ORAL
  Filled 2018-05-04 (×2): qty 2

## 2018-05-04 MED ORDER — METHOCARBAMOL 500 MG PO TABS
500.0000 mg | ORAL_TABLET | Freq: Two times a day (BID) | ORAL | Status: DC | PRN
  Administered 2018-05-05 (×2): 500 mg via ORAL
  Filled 2018-05-04 (×2): qty 1

## 2018-05-04 NOTE — Progress Notes (Signed)
Has complained of increased pain in left thigh and moans when assisted to bedside commode.  Wishes at this point to go to SNF due to inability to walk.  Baxter Flattery, Education officer, museum at [employer] is involved.  Unable to void and in and out cath yielded 680 at 1115.  Had BM and was brown with no signs of bleeding.

## 2018-05-04 NOTE — Clinical Social Work Note (Signed)
Clinical Social Work Assessment  Patient Details  Name: Nicole [name] MRN: [id number] Date of Birth: 10-24-1945  Date of referral:  05/04/18               Reason for consult:  Facility Placement                Permission sought to share information with:  Facility Sport and exercise psychologist, Family Supports Permission granted to share information::  Yes, Verbal Permission Granted  Name::     Nicole [name]  Agency::  yes  Relationship::  Daughter and family supports  Contact Information:  yes  Housing/Transportation Living arrangements for the past 2 months:  Single Family Home Source of Information:  Adult Children Patient Interpreter Needed:  None Criminal Activity/Legal Involvement Pertinent to Current Situation/Hospitalization:  No - Comment as needed Significant Relationships:  Adult Children, Siblings Lives with:  Self Do you feel safe going back to the place where you live?  Yes Need for family participation in patient care:  Yes (Comment)  Care giving concerns:  CSW received consult regarding SNF placement.  CSW spoke with pt's daughter.  Pt gave verbal permission for daughter to speak on her behalf.  Pt and pt's daughter would like for pt to reside at SNF until pt is stronger.  Pt's daughter stated" her mother thought she could return home after discharging until she could not walk as well on Friday.  Pt is agreeable to SNF.   Social Worker assessment / plan:  CSW spoke with pt and pt's daughter regarding SNF placement.  Pt and Pt's daughter are agreeable for placement.  Employment status:  Retired Forensic scientist:  Medicare PT Recommendations:  [place] / Referral to community resources:  (NA)  Patient/Family's Response to care:  Pt and pt's daughter report greement with discharge plan.  Patient/Family's Understanding of and Emotional Response to Diagnosis, Current Treatment, and Prognosis:  Pt and pt's daughter are realistic regarding therapy  needs and expressed being hopeful for SNF placement.  Pt's daughter expressed understanding of CSW role and discharge process as well as medical condition.  No questions or concerns about plan or treatment.  Emotional Assessment Appearance:  Other (Comment Required(Not able to access (permission given for daughter to speak for pt by pt)) Attitude/Demeanor/Rapport:  Unable to Assess Affect (typically observed):  Unable to Assess Orientation:  Oriented to Self, Oriented to Place, Oriented to  Time, Oriented to Situation Alcohol / Substance use:  Not Applicable Psych involvement (Current and /or in the community):  No (Comment)  Discharge Needs  Concerns to be addressed:  Discharge Planning Concerns Readmission within the last 30 days:  No Current discharge risk:  Physical Impairment, Lives alone Barriers to Discharge:  Continued Medical Work up   [employer], [place] 05/04/2018, 12:46 PM

## 2018-05-04 NOTE — Progress Notes (Signed)
CSW has not received insurance auth for pt's placement.  CSW will continue to follow. Reed Breech LCSWA [phone]

## 2018-05-04 NOTE — Progress Notes (Signed)
PROGRESS NOTE    Nicole [name]  LZJ:[id number] DOB: Feb 14, 1946 DOA: 05/01/2018 PCP: Sharilyn Sites, MD    Brief Narrative:  72 y.o. female with significant PMH for HTN, CKD stage 3, chronic diastolic HF, morbid Obesity, GERD, diverticulosis and PMR (on chronic prednisone); who presented to ED with complaints of abd pain, anorexia and nausea. Symptoms has been present for the last week or so; patient reported pain as intermittent, crampy in nature, localized in her LLQ, no radiating, associated with nausea (but no vomiting) and anorexia. She expressed pain at its worst is 8-9/10 and was worsen by eating. Patient also reported intermittent episodes of BRBPR. She denies CP, SOB, HA's, focal weakness, dysuria, hematemesis and hematuria.  Assessment & Plan: 1-Diverticulitis large intestine w/o perforation or abscess w bleeding -continue to follow Hgb trend -transfusion if needed -continue antibiotics by mouth (3/10) -tolerating soft diet -no further bleeding appreciated -still having some pain in her LLQ; but significantly improved. -continue PRN analgesics.  2-Acute on chronic anemia -in the setting of diverticulitis -no transfusion needed at this moment -follow Hgb trend   3-GERD (gastroesophageal reflux disease) -will continue PPI  4-HTN (hypertension) -stable, overall -continue to resume home regimen slowly. -follow sodium in her diet  5-Chronic pain syndrome -continue Nucynta as per home orders -PRN dilaudid for severe pain no relieved with oral regimen. -will also continue as needed Robaxin    6-Polymyalgia rheumatica (HCC) -continue steroids, patient on tapering process -Continue using 15 mg daily for 2 weeks currently.  7-Morbid obesity with BMI of 50.0-59.9, adult (HCC) -low calorie diet and portion control discussed with patient. -will benefit of eval at bariatric clinic.  8-Chronic diastolic HF (heart failure) (HCC) -has remained compensated -continue lasix -low  sodium diet encouraged  -follow daily weights  9-acute on chronic renal failure -Cr stable/improved after gentle IVF's and holding diuretics. -follow trend -continue minimizing/avoiding nephrotoxic agents  10-Physical deconditioning -After discussing with daughter and patient once again they have agreed to pursued a skilled nursing facility placement for further care and rehabilitation. -Social worker has been made aware. -Continue as needed analgesics.  11-transient episode of urinary retention -Most likely associated with the use of extra analgesics and Robaxin -In and out catheter x1 done -Patient able to void later on without any problem. -Will continue monitoring.  12-hypokalemia/hypomagnesemia -Most likely associated with diuretic regimen -Replete electrolytes as needed and follow trend.   DVT prophylaxis: SCD's Code Status: Full Code Family Communication: Daughter at bedside Disposition Plan: remains inpatient, continue antibiotics now PO, replete electrolytes; will need SNF for care and rehab. SW aware.    Consultants:   None   Procedures:   See below for x-ray reports   Antimicrobials:  Anti-infectives (From admission, onward)   Start     Dose/Rate Route Frequency Ordered Stop   05/03/18 2200  metroNIDAZOLE (FLAGYL) tablet 500 mg     500 mg Oral Every 8 hours 05/03/18 1808     05/03/18 2000  ciprofloxacin (CIPRO) tablet 500 mg     500 mg Oral 2 times daily 05/03/18 1808     05/01/18 2200  ciprofloxacin (CIPRO) IVPB 400 mg  Status:  Discontinued     400 mg 200 mL/hr over 60 Minutes Intravenous Every 12 hours 05/01/18 1015 05/03/18 1808   05/01/18 2200  hydroxychloroquine (PLAQUENIL) tablet 200 mg    Note to Pharmacy:  TAKE 1 TABLET IN THE MORNING AND 1 TABLET IN THE EVENING, [place]. NONE ON SATURDAY OR SUNDAY  200 mg Oral 2 times per day on Mon Tue Wed Thu Fri 05/01/18 2138     05/01/18 1600  metroNIDAZOLE (FLAGYL) IVPB 500 mg  Status:   Discontinued     500 mg 100 mL/hr over 60 Minutes Intravenous Every 8 hours 05/01/18 1015 05/03/18 1808   05/01/18 0830  metroNIDAZOLE (FLAGYL) IVPB 500 mg     500 mg 100 mL/hr over 60 Minutes Intravenous  Once 05/01/18 0815 05/01/18 0938   05/01/18 0830  ciprofloxacin (CIPRO) IVPB 400 mg     400 mg 200 mL/hr over 60 Minutes Intravenous  Once 05/01/18 0815 05/01/18 1039      Subjective: No nausea, no vomiting, tolerating diet and medications by mouth.  She is calmer today, but is still complaining of left side pain and feeling weak/deconditioned.  Objective: Vitals:   05/03/18 2233 05/04/18 0429 05/04/18 0437 05/04/18 1351  BP: 113/66 125/73  113/68  Pulse: 89 88  99  Resp: 19 (!) 23  18  Temp: 98.3 F (36.8 C) 98 F (36.7 C)  99.2 F (37.3 C)  TempSrc: Oral Oral  Oral  SpO2: 95% 97%  93%  Weight:   124.4 kg (274 lb 4 oz)   Height:        Intake/Output Summary (Last 24 hours) at 05/04/2018 1746 Last data filed at 05/04/2018 1400 Gross per 24 hour  Intake 720 ml  Output 1480 ml  Net -760 ml   Filed Weights   05/02/18 1320 05/03/18 0612 05/04/18 0437  Weight: 127.1 kg (280 lb 3.3 oz) 128 kg (282 lb 3 oz) 124.4 kg (274 lb 4 oz)    Examination: General exam: Alert, awake, oriented x 3; no nausea, no vomiting, tolerating p.o. medications and diet.  Patient is slightly calmer today even she is still complaining of left side pain. Respiratory system: Clear to auscultation. Respiratory effort normal. Cardiovascular system:RRR. No murmurs, rubs, gallops. Gastrointestinal system: Abdomen is nondistended, soft and with just mild tenderness on deep palpation in the left lower quadrant. No organomegaly or masses felt. Normal bowel sounds heard. Central nervous system: Alert and oriented. No focal neurological deficits. Extremities: No cyanosis, no clubbing; 1+ edema bilaterally appreciated. Skin: No rashes, no petechiae; patient with venous stasis dermatitis on both  legs Psychiatry: Judgement and insight appear normal. Mood & affect appropriate.   Data Reviewed: I have personally reviewed following labs and imaging studies  CBC: Recent Labs  Lab 04/30/18 1610 05/01/18 0619 05/02/18 0517 05/04/18 0607  WBC  --  16.5* 18.2* 10.6*  NEUTROABS  --  12.7*  --   --   HGB 11.1* 10.1* 9.2* 9.4*  HCT 32.5* 31.3* 27.7* 28.9*  MCV  --  91.8 90.8 90.6  PLT  --  297 296 626   Basic Metabolic Panel: Recent Labs  Lab 04/30/18 1104 05/01/18 0619 05/02/18 0517 05/04/18 0607 05/04/18 0843  NA 141 139 140 140  --   K 3.5 3.8 3.8 3.1*  --   CL 100 100 105 103  --   CO2 30 28 26 28   --   GLUCOSE 104* 130* 100* 88  --   BUN 42* 44* 33* 20  --   CREATININE 1.78* 2.15* 1.56* 1.47*  --   CALCIUM 9.4 8.9 8.8* 8.9  --   MG  --   --   --   --  1.5*   GFR: Estimated Creatinine Clearance: 42.7 mL/min (A) (by C-G formula based on SCr of 1.47 mg/dL (H)).  Liver Function Tests: Recent Labs  Lab 05/01/18 0619  AST 19  ALT 21  ALKPHOS 68  BILITOT 0.9  PROT 6.4*  ALBUMIN 3.0*   Urine analysis:    Component Value Date/Time   COLORURINE YELLOW 04/16/2018 1342   APPEARANCEUR CLEAR 04/16/2018 1342   LABSPEC 1.013 04/16/2018 1342   PHURINE 5.0 04/16/2018 1342   GLUCOSEU NEGATIVE 04/16/2018 1342   HGBUR NEGATIVE 04/16/2018 1342   BILIRUBINUR NEGATIVE 04/16/2018 1342   KETONESUR NEGATIVE 04/16/2018 1342   PROTEINUR NEGATIVE 04/16/2018 1342   UROBILINOGEN 0.2 02/20/2014 1116   NITRITE NEGATIVE 04/16/2018 1342   LEUKOCYTESUR NEGATIVE 04/16/2018 1342    Radiology Studies: No results found.  Scheduled Meds: . ciprofloxacin  500 mg Oral BID  . furosemide  40 mg Oral Daily  . gabapentin  200 mg Oral TID  . hydroxychloroquine  200 mg Oral 2 times per day on Mon Tue Wed Thu Fri  . metroNIDAZOLE  500 mg Oral Q8H  . pantoprazole  40 mg Oral Daily  . potassium chloride SA  40 mEq Oral Daily  . predniSONE  15 mg Oral Q breakfast  . sodium chloride flush   3 mL Intravenous Q12H   Continuous Infusions: . sodium chloride    . magnesium sulfate 1 - 4 g bolus IVPB       LOS: 2 days    Time spent: 30 minutes.   Barton Dubois, MD [facility] Pager [phone]  If 7PM-7AM, please contact night-coverage [email or web address] Password [facility] 05/04/2018, 5:46 PM

## 2018-05-04 NOTE — Progress Notes (Signed)
Assisted to bedside commode and moaned with pain in left leg.  Had small soft brown BM with no signs of blood.

## 2018-05-04 NOTE — Progress Notes (Deleted)
CSW has not received insurance auth for pt's placement.  CSW will continue to follow for information.  Reed Breech LCSWA [phone]

## 2018-05-04 NOTE — NC FL2 (Signed)
[place] LEVEL OF CARE SCREENING TOOL     IDENTIFICATION  Patient Name: Nicole [name] Birthdate: 1945/11/07 Sex: female Admission Date (Current Location): 05/01/2018  [facility] and [place] Number:  [employer] and Address:  [facility] [address], [place]      Provider Number: [id number]  Attending Physician Name and Address:  Barton Dubois, MD  Relative Name and Phone Number:  Vonzell Schlatter [phone]    Current Level of Care: Hospital Recommended Level of Care: [facility] Prior Approval Number:    Date Approved/Denied: 05/04/18 PASRR Number: [id number]  Discharge Plan: SNF    Current Diagnoses: Patient Active Problem List   Diagnosis Date Noted  . Diverticulitis 05/02/2018  . Diverticulitis large intestine w/o perforation or abscess w/o bleeding 05/01/2018  . Morbid obesity with BMI of 50.0-59.9, adult ([place]) 05/01/2018  . Chronic diastolic HF (heart failure) ([place]) 05/01/2018  . On prednisone therapy 05/31/2017  . Positive anti-CCP test 05/31/2017  . Rheumatoid factor positive 05/31/2017  . Memory loss 04/04/2017  . Polymyalgia rheumatica ([place]) 04/04/2017  . High risk medication use 12/07/2016  . DDD (degenerative disc disease), lumbar 09/27/2016  . HTN (hypertension) 06/05/2016  . Chronic pain syndrome 06/05/2016  . Acute diverticulitis 06/02/2016  . Diverticulitis large intestine 06/02/2016  . Spinal stenosis of lumbar region 02/29/2016  . Joint pain 02/29/2016  . Endometrial polyp 02/06/2014  . Postmenopausal bleeding 01/30/2014  . GERD (gastroesophageal reflux disease) 09/29/2013  . Spinal stenosis, thoracic 09/29/2013  . Shingles 09/29/2013  . Thoracic or lumbosacral neuritis or radiculitis, unspecified 05/04/2011  . Abnormality of gait 05/04/2011  . Muscle weakness (generalized) 05/04/2011  . Bilateral primary osteoarthritis of knee 04/07/2009  . KNEE PAIN 04/07/2009    Orientation  RESPIRATION BLADDER Height & Weight     Self, Time, Situation, Place  Normal Continent Weight: 274 lb 4 oz (124.4 kg) Height:  5' (152.4 cm)  BEHAVIORAL SYMPTOMS/MOOD NEUROLOGICAL BOWEL NUTRITION STATUS  (NA) (NA) Continent Diet  AMBULATORY STATUS COMMUNICATION OF NEEDS Skin   Extensive Assist Verbally Normal                       Personal Care Assistance Level of Assistance  Bathing, Feeding, Dressing Bathing Assistance: Maximum assistance Feeding assistance: Independent Dressing Assistance: Maximum assistance     Functional Limitations Info  Sight, Hearing, Speech Sight Info: Adequate Hearing Info: Adequate Speech Info: Adequate    SPECIAL CARE FACTORS FREQUENCY  PT (By licensed PT)     PT Frequency: 3X weekly              Contractures Contractures Info: Not present    Additional Factors Info  Code Status, Allergies Code Status Info: Full Allergies Info: Oxycontin (oxycodone), Sulfa antibiotics, Tramadols, penicillins, sulfasalazine           Current Medications (05/04/2018):  This is the current hospital active medication list Current Facility-Administered Medications  Medication Dose Route Frequency Provider Last Rate Last Dose  . 0.9 %  sodium chloride infusion  250 mL Intravenous PRN Barton Dubois, MD      . acetaminophen (TYLENOL) tablet 650 mg  650 mg Oral Q6H PRN Barton Dubois, MD   650 mg at 05/04/18 0945   Or  . acetaminophen (TYLENOL) suppository 650 mg  650 mg Rectal Q6H PRN Barton Dubois, MD      . ciprofloxacin (CIPRO) tablet 500 mg  500 mg Oral BID Barton Dubois, MD  500 mg at 05/04/18 0945  . furosemide (LASIX) tablet 40 mg  40 mg Oral Daily Barton Dubois, MD   40 mg at 05/04/18 0945  . gabapentin (NEURONTIN) capsule 200 mg  200 mg Oral TID Barton Dubois, MD   200 mg at 05/04/18 0945  . HYDROmorphone (DILAUDID) tablet 2 mg  2 mg Oral Q8H PRN Barton Dubois, MD   2 mg at 05/04/18 1143  . hydroxychloroquine (PLAQUENIL) tablet 200 mg   200 mg Oral 2 times per day on Mon Tue Wed Thu Fri [place], Carlos, MD   200 mg at 05/03/18 2127  . methocarbamol (ROBAXIN) tablet 500 mg  500 mg Oral Q8H PRN Barton Dubois, MD   500 mg at 05/04/18 0530  . metroNIDAZOLE (FLAGYL) tablet 500 mg  500 mg Oral Q8H Barton Dubois, MD   500 mg at 05/04/18 0511  . ondansetron (ZOFRAN) injection 4 mg  4 mg Intravenous Q6H PRN Barton Dubois, MD      . pantoprazole (PROTONIX) EC tablet 40 mg  40 mg Oral Daily Barton Dubois, MD   40 mg at 05/04/18 0945  . polyvinyl alcohol (LIQUIFILM TEARS) 1.4 % ophthalmic solution 1 drop  1 drop Both Eyes PRN Barton Dubois, MD   1 drop at 05/02/18 2155  . potassium chloride SA (K-DUR,KLOR-CON) CR tablet 40 mEq  40 mEq Oral Daily Barton Dubois, MD   40 mEq at 05/04/18 0945  . predniSONE (DELTASONE) tablet 15 mg  15 mg Oral Q breakfast Barton Dubois, MD   15 mg at 05/04/18 0944  . sodium chloride flush (NS) 0.9 % injection 3 mL  3 mL Intravenous Q12H Barton Dubois, MD   3 mL at 05/04/18 0946  . sodium chloride flush (NS) 0.9 % injection 3 mL  3 mL Intravenous PRN Barton Dubois, MD   3 mL at 05/03/18 1646  . tapentadol (NUCYNTA) tablet 50 mg  50 mg Oral TID PRN Barton Dubois, MD   50 mg at 05/04/18 0768     Discharge Medications: Please see discharge summary for a list of discharge medications.  Relevant Imaging Results:  Relevant Lab Results:   Additional Information SS# [id number]  Carolin Sicks, [place]

## 2018-05-05 ENCOUNTER — Inpatient Hospital Stay (HOSPITAL_COMMUNITY): Payer: Medicare Other

## 2018-05-05 LAB — BASIC METABOLIC PANEL
Anion gap: 11 (ref 5–15)
BUN: 18 mg/dL (ref 8–23)
CO2: 25 mmol/L (ref 22–32)
Calcium: 8.9 mg/dL (ref 8.9–10.3)
Chloride: 102 mmol/L (ref 98–111)
Creatinine, Ser: 1.44 mg/dL — ABNORMAL HIGH (ref 0.44–1.00)
GFR calc Af Amer: 41 mL/min — ABNORMAL LOW (ref >60)
GFR calc non Af Amer: 36 mL/min — ABNORMAL LOW (ref >60)
Glucose, Bld: 94 mg/dL (ref 70–99)
Potassium: 3.3 mmol/L — ABNORMAL LOW (ref 3.5–5.1)
Sodium: 138 mmol/L (ref 135–145)

## 2018-05-05 MED ORDER — KETOROLAC TROMETHAMINE 30 MG/ML IJ SOLN
30.0000 mg | Freq: Once | INTRAMUSCULAR | Status: AC
  Administered 2018-05-05: 30 mg via INTRAVENOUS
  Filled 2018-05-05: qty 1

## 2018-05-05 MED ORDER — MUSCLE RUB 10-15 % EX CREA
TOPICAL_CREAM | CUTANEOUS | Status: DC | PRN
  Administered 2018-05-05: 15:00:00 via TOPICAL
  Filled 2018-05-05: qty 85

## 2018-05-05 NOTE — Progress Notes (Signed)
PROGRESS NOTE    Nicole [name]  VWU:[id number] DOB: August 26, 1946 DOA: 05/01/2018 PCP: Sharilyn Sites, MD    Brief Narrative:  72 y.o. female with significant PMH for HTN, CKD stage 3, chronic diastolic HF, morbid Obesity, GERD, diverticulosis and PMR (on chronic prednisone); who presented to ED with complaints of abd pain, anorexia and nausea. Symptoms has been present for the last week or so; patient reported pain as intermittent, crampy in nature, localized in her LLQ, no radiating, associated with nausea (but no vomiting) and anorexia. She expressed pain at its worst is 8-9/10 and was worsen by eating. Patient also reported intermittent episodes of BRBPR. She denies CP, SOB, HA's, focal weakness, dysuria, hematemesis and hematuria.  Assessment & Plan: 1-Diverticulitis large intestine w/o perforation or abscess w bleeding -continue to follow Hgb trend -transfusion if needed -continue antibiotics by mouth (4/10) -tolerating soft diet -no further bleeding appreciated -very mild LLQ pain on exam  -continue PRN analgesics.  2-Acute on chronic anemia -in the setting of diverticulitis -no transfusion needed at this moment -follow Hgb trend  -has remained stable and no further signs of bleeding appreciated.  3-GERD (gastroesophageal reflux disease) -will continue PPI  4-HTN (hypertension) -stable, overall -continue to resume home regimen slowly. -follow sodium in her diet  5-Chronic pain syndrome -continue Nucynta as per home orders -PRN dilaudid for severe pain no relieved with oral regimen. -will also continue as needed Robaxin -one dose of toradol to be given today, as her pain remained uncontrollable. -will also check CT lower back.     6-Polymyalgia rheumatica (HCC) -continue steroids, patient on tapering process -will Continue using 15 mg daily for 2 weeks currently.  7-Morbid obesity with BMI of 50.0-59.9, adult (HCC) -low calorie diet and portion control discussed with  patient. -will benefit of eval at bariatric clinic.  8-Chronic diastolic HF (heart failure) (HCC) -has remained compensated -continue lasix -low sodium diet encouraged  -follow daily weights and intake/output  9-acute on chronic renal failure -Cr stable/improved after gentle IVF's and holding diuretics. -follow trend -continue minimizing/avoiding nephrotoxic agents  10-Physical deconditioning -After discussing with daughter and patient once again they have agreed to pursued a skilled nursing facility placement for further care and rehabilitation. -Social worker has been made aware. -Continue as needed analgesics.  11-transient episode of urinary retention -Most likely associated with the use of extra analgesics and Robaxin -In and out catheter x1 done -Patient has been able to void after that without any problem. -Will continue monitoring.  12-hypokalemia/hypomagnesemia -Most likely associated with diuretic regimen -Replete electrolytes as needed and follow trend.   DVT prophylaxis: SCD's Code Status: Full Code Family Communication: Daughter at bedside Disposition Plan: remains inpatient, continue antibiotics now PO, replete electrolytes; will need SNF for care and rehab. SW aware.    Consultants:   None   Procedures:   See below for x-ray reports   Antimicrobials:  Anti-infectives (From admission, onward)   Start     Dose/Rate Route Frequency Ordered Stop   05/03/18 2200  metroNIDAZOLE (FLAGYL) tablet 500 mg     500 mg Oral Every 8 hours 05/03/18 1808     05/03/18 2000  ciprofloxacin (CIPRO) tablet 500 mg     500 mg Oral 2 times daily 05/03/18 1808     05/01/18 2200  ciprofloxacin (CIPRO) IVPB 400 mg  Status:  Discontinued     400 mg 200 mL/hr over 60 Minutes Intravenous Every 12 hours 05/01/18 1015 05/03/18 1808   05/01/18 2200  hydroxychloroquine (PLAQUENIL)  tablet 200 mg    Note to Pharmacy:  TAKE 1 TABLET IN THE MORNING AND 1 TABLET IN THE EVENING, [place]. NONE ON SATURDAY OR SUNDAY     200 mg Oral 2 times per day on Mon Tue Wed Thu Fri 05/01/18 2138     05/01/18 1600  metroNIDAZOLE (FLAGYL) IVPB 500 mg  Status:  Discontinued     500 mg 100 mL/hr over 60 Minutes Intravenous Every 8 hours 05/01/18 1015 05/03/18 1808   05/01/18 0830  metroNIDAZOLE (FLAGYL) IVPB 500 mg     500 mg 100 mL/hr over 60 Minutes Intravenous  Once 05/01/18 0815 05/01/18 0938   05/01/18 0830  ciprofloxacin (CIPRO) IVPB 400 mg     40 0 mg 200 mL/hr over 60 Minutes Intravenous  Once 05/01/18 0815 05/01/18 1039      Subjective: Patient with one episode of vomiting earlier this morning after taking her medications; denies any nausea, chest pain, fever, dysuria or headaches.  Continues to have mild left lower quadrant discomfort and severe uncontrollable left side and lower back pain  Objective: Vitals:   05/04/18 1351 05/04/18 2026 05/04/18 2134 05/05/18 0539  BP: 113/68  132/77 102/61  Pulse: 99  98 97  Resp: 18  18   Temp: 99.2 F (37.3 C)  98.4 F (36.9 C) 98.5 F (36.9 C)  TempSrc: Oral  Oral Oral  SpO2: 93% 91% 95% 94%  Weight:    123.1 kg (271 lb 6.2 oz)  Height:        Intake/Output Summary (Last 24 hours) at 05/05/2018 1313 Last data filed at 05/04/2018 1959 Gross per 24 hour  Intake -  Output 800 ml  Net -800 ml   Filed Weights   05/03/18 0612 05/04/18 0437 05/05/18 0539  Weight: 128 kg (282 lb 3 oz) 124.4 kg (274 lb 4 oz) 123.1 kg (271 lb 6.2 oz)    Examination: General exam: Alert, awake, oriented x 3; reports improvement in her abdominal discomfort, tolerating diet.  Have a mild episode of vomiting after taking her pills this morning.  Continued to experience uncontrollable pain on her lower back on the left side (she expressed being unable to bear weight or to walk at this time). Respiratory system: Clear to auscultation. Respiratory effort normal. Cardiovascular system:RRR. No murmurs, rubs, gallops. Gastrointestinal  system: Abdomen is nondistended, soft and with mild tenderness on palpation on her LLQ. Normal bowel sounds heard. Central nervous system: Alert and oriented. No focal neurological deficits. Extremities: No C gnosis, no clubbing.  Trace edema bilaterally appreciated. Skin: Venous stasis dermatitis appreciated, wounds on her legs without any purulent drainage. Psychiatry: Judgement and insight appear normal. Mood & affect appropriate.    Data Reviewed: I have personally reviewed following labs and imaging studies  CBC: Recent Labs  Lab 04/30/18 1610 05/01/18 0619 05/02/18 0517 05/04/18 0607  WBC  --  16.5* 18.2* 10.6*  NEUTROABS  --  12.7*  --   --   HGB 11.1* 10.1* 9.2* 9.4*  HCT 32.5* 31.3* 27.7* 28.9*  MCV  --  91.8 90.8 90.6  PLT  --  297 296 759   Basic Metabolic Panel: Recent Labs  Lab 04/30/18 1104 05/01/18 0619 05/02/18 0517 05/04/18 0607 05/04/18 0843 05/05/18 0633  NA 141 139 140 140  --  138  K 3.5 3.8 3.8 3.1*  --  3.3*  CL 100 100 105 103  --  102  CO2 30 28 26 28   --  25  GLUCOSE 104* 130* 100* 88  --  94  BUN 42* 44* 33* 20  --  18  CREATININE 1.78* 2.15* 1.56* 1.47*  --  1.44*  CALCIUM 9.4 8.9 8.8* 8.9  --  8.9  MG  --   --   --   --  1.5*  --    GFR: Estimated Creatinine Clearance: 43.3 mL/min (A) (by C-G formula based on SCr of 1.44 mg/dL (H)).   Liver Function Tests: Recent Labs  Lab 05/01/18 0619  AST 19  ALT 21  ALKPHOS 68  BILITOT 0.9  PROT 6.4*  ALBUMIN 3.0*   Urine analysis:    Component Value Date/Time   COLORURINE YELLOW 04/16/2018 1342   APPEARANCEUR CLEAR 04/16/2018 1342   LABSPEC 1.013 04/16/2018 1342   PHURINE 5.0 04/16/2018 1342   GLUCOSEU NEGATIVE 04/16/2018 1342   HGBUR NEGATIVE 04/16/2018 1342   BILIRUBINUR NEGATIVE 04/16/2018 1342   KETONESUR NEGATIVE 04/16/2018 1342   PROTEINUR NEGATIVE 04/16/2018 1342   UROBILINOGEN 0.2 02/20/2014 1116   NITRITE NEGATIVE 04/16/2018 1342   LEUKOCYTESUR NEGATIVE 04/16/2018 1342     Radiology Studies: No results found.  Scheduled Meds: . ciprofloxacin  500 mg Oral BID  . furosemide  40 mg Oral Daily  . gabapentin  200 mg Oral TID  . hydroxychloroquine  200 mg Oral 2 times per day on Mon Tue Wed Thu Fri  . metroNIDAZOLE  500 mg Oral Q8H  . pantoprazole  40 mg Oral Daily  . potassium chloride SA  40 mEq Oral Daily  . predniSONE  15 mg Oral Q breakfast  . sodium chloride flush  3 mL Intravenous Q12H   Continuous Infusions: . sodium chloride       LOS: 3 days    Time spent: 30 minutes.   Barton Dubois, MD [facility] Pager [phone]  If 7PM-7AM, please contact night-coverage [email or web address] Password [email or web address] 05/05/2018, 1:13 PM

## 2018-05-05 NOTE — Progress Notes (Signed)
Patient voided in BSC at beginning of shift and none throughout night. Attempted to assist up to [facility] after medicating for pain this a.m. and patient refused stating that she did not want to make her leg hurt again, states she could not use bedpan or purewick, requesting I do an in and out catheter. Advised of MD orders ok to do in and out only if bladder scan shows more than 300 mls of urine. Bladder scan done, showing 269 ml's. Will continue to monitor.

## 2018-05-06 DIAGNOSIS — M5126 Other intervertebral disc displacement, lumbar region: Secondary | ICD-10-CM | POA: Diagnosis not present

## 2018-05-06 DIAGNOSIS — I10 Essential (primary) hypertension: Secondary | ICD-10-CM | POA: Diagnosis not present

## 2018-05-06 DIAGNOSIS — Z87891 Personal history of nicotine dependence: Secondary | ICD-10-CM | POA: Diagnosis not present

## 2018-05-06 DIAGNOSIS — G894 Chronic pain syndrome: Secondary | ICD-10-CM | POA: Diagnosis not present

## 2018-05-06 DIAGNOSIS — R279 Unspecified lack of coordination: Secondary | ICD-10-CM | POA: Diagnosis not present

## 2018-05-06 DIAGNOSIS — K219 Gastro-esophageal reflux disease without esophagitis: Secondary | ICD-10-CM | POA: Diagnosis not present

## 2018-05-06 DIAGNOSIS — R262 Difficulty in walking, not elsewhere classified: Secondary | ICD-10-CM | POA: Diagnosis not present

## 2018-05-06 DIAGNOSIS — K5903 Drug induced constipation: Secondary | ICD-10-CM | POA: Diagnosis not present

## 2018-05-06 DIAGNOSIS — Z7401 Bed confinement status: Secondary | ICD-10-CM | POA: Diagnosis not present

## 2018-05-06 DIAGNOSIS — M546 Pain in thoracic spine: Secondary | ICD-10-CM | POA: Diagnosis not present

## 2018-05-06 DIAGNOSIS — I130 Hypertensive heart and chronic kidney disease with heart failure and stage 1 through stage 4 chronic kidney disease, or unspecified chronic kidney disease: Secondary | ICD-10-CM | POA: Diagnosis present

## 2018-05-06 DIAGNOSIS — M48062 Spinal stenosis, lumbar region with neurogenic claudication: Secondary | ICD-10-CM | POA: Diagnosis not present

## 2018-05-06 DIAGNOSIS — N183 Chronic kidney disease, stage 3 (moderate): Secondary | ICD-10-CM | POA: Diagnosis not present

## 2018-05-06 DIAGNOSIS — K649 Unspecified hemorrhoids: Secondary | ICD-10-CM | POA: Diagnosis not present

## 2018-05-06 DIAGNOSIS — M4807 Spinal stenosis, lumbosacral region: Secondary | ICD-10-CM | POA: Diagnosis not present

## 2018-05-06 DIAGNOSIS — N390 Urinary tract infection, site not specified: Secondary | ICD-10-CM | POA: Diagnosis present

## 2018-05-06 DIAGNOSIS — M5410 Radiculopathy, site unspecified: Secondary | ICD-10-CM | POA: Diagnosis not present

## 2018-05-06 DIAGNOSIS — E876 Hypokalemia: Secondary | ICD-10-CM | POA: Diagnosis not present

## 2018-05-06 DIAGNOSIS — Z01818 Encounter for other preprocedural examination: Secondary | ICD-10-CM | POA: Diagnosis not present

## 2018-05-06 DIAGNOSIS — R52 Pain, unspecified: Secondary | ICD-10-CM | POA: Diagnosis not present

## 2018-05-06 DIAGNOSIS — M5127 Other intervertebral disc displacement, lumbosacral region: Secondary | ICD-10-CM | POA: Diagnosis not present

## 2018-05-06 DIAGNOSIS — M353 Polymyalgia rheumatica: Secondary | ICD-10-CM | POA: Diagnosis not present

## 2018-05-06 DIAGNOSIS — M47816 Spondylosis without myelopathy or radiculopathy, lumbar region: Secondary | ICD-10-CM | POA: Diagnosis not present

## 2018-05-06 DIAGNOSIS — Z7409 Other reduced mobility: Secondary | ICD-10-CM | POA: Diagnosis not present

## 2018-05-06 DIAGNOSIS — J9601 Acute respiratory failure with hypoxia: Secondary | ICD-10-CM | POA: Diagnosis not present

## 2018-05-06 DIAGNOSIS — M5137 Other intervertebral disc degeneration, lumbosacral region: Secondary | ICD-10-CM | POA: Diagnosis not present

## 2018-05-06 DIAGNOSIS — Z79899 Other long term (current) drug therapy: Secondary | ICD-10-CM | POA: Diagnosis not present

## 2018-05-06 DIAGNOSIS — D500 Iron deficiency anemia secondary to blood loss (chronic): Secondary | ICD-10-CM | POA: Diagnosis not present

## 2018-05-06 DIAGNOSIS — Z6841 Body Mass Index (BMI) 40.0 and over, adult: Secondary | ICD-10-CM | POA: Diagnosis not present

## 2018-05-06 DIAGNOSIS — R000 Tachycardia, unspecified: Secondary | ICD-10-CM | POA: Diagnosis not present

## 2018-05-06 DIAGNOSIS — D649 Anemia, unspecified: Secondary | ICD-10-CM | POA: Diagnosis not present

## 2018-05-06 DIAGNOSIS — M1990 Unspecified osteoarthritis, unspecified site: Secondary | ICD-10-CM | POA: Diagnosis present

## 2018-05-06 DIAGNOSIS — M5116 Intervertebral disc disorders with radiculopathy, lumbar region: Secondary | ICD-10-CM | POA: Diagnosis not present

## 2018-05-06 DIAGNOSIS — I2692 Saddle embolus of pulmonary artery without acute cor pulmonale: Secondary | ICD-10-CM | POA: Diagnosis not present

## 2018-05-06 DIAGNOSIS — Z885 Allergy status to narcotic agent status: Secondary | ICD-10-CM | POA: Diagnosis not present

## 2018-05-06 DIAGNOSIS — G8929 Other chronic pain: Secondary | ICD-10-CM | POA: Diagnosis present

## 2018-05-06 DIAGNOSIS — H5500 Unspecified nystagmus: Secondary | ICD-10-CM | POA: Diagnosis not present

## 2018-05-06 DIAGNOSIS — K5700 Diverticulitis of small intestine with perforation and abscess without bleeding: Secondary | ICD-10-CM | POA: Diagnosis not present

## 2018-05-06 DIAGNOSIS — M545 Low back pain: Secondary | ICD-10-CM | POA: Diagnosis not present

## 2018-05-06 DIAGNOSIS — M5442 Lumbago with sciatica, left side: Secondary | ICD-10-CM | POA: Diagnosis not present

## 2018-05-06 DIAGNOSIS — J9811 Atelectasis: Secondary | ICD-10-CM | POA: Diagnosis not present

## 2018-05-06 DIAGNOSIS — T40605A Adverse effect of unspecified narcotics, initial encounter: Secondary | ICD-10-CM | POA: Diagnosis not present

## 2018-05-06 DIAGNOSIS — I4510 Unspecified right bundle-branch block: Secondary | ICD-10-CM | POA: Diagnosis present

## 2018-05-06 DIAGNOSIS — M48061 Spinal stenosis, lumbar region without neurogenic claudication: Secondary | ICD-10-CM | POA: Diagnosis not present

## 2018-05-06 DIAGNOSIS — M419 Scoliosis, unspecified: Secondary | ICD-10-CM | POA: Diagnosis not present

## 2018-05-06 DIAGNOSIS — N179 Acute kidney failure, unspecified: Secondary | ICD-10-CM | POA: Diagnosis present

## 2018-05-06 DIAGNOSIS — M069 Rheumatoid arthritis, unspecified: Secondary | ICD-10-CM | POA: Diagnosis not present

## 2018-05-06 DIAGNOSIS — I5030 Unspecified diastolic (congestive) heart failure: Secondary | ICD-10-CM | POA: Diagnosis not present

## 2018-05-06 DIAGNOSIS — I2602 Saddle embolus of pulmonary artery with acute cor pulmonale: Secondary | ICD-10-CM | POA: Diagnosis not present

## 2018-05-06 DIAGNOSIS — Z7901 Long term (current) use of anticoagulants: Secondary | ICD-10-CM | POA: Diagnosis not present

## 2018-05-06 DIAGNOSIS — Z7952 Long term (current) use of systemic steroids: Secondary | ICD-10-CM | POA: Diagnosis not present

## 2018-05-06 DIAGNOSIS — K5732 Diverticulitis of large intestine without perforation or abscess without bleeding: Secondary | ICD-10-CM | POA: Diagnosis not present

## 2018-05-06 DIAGNOSIS — N184 Chronic kidney disease, stage 4 (severe): Secondary | ICD-10-CM | POA: Diagnosis present

## 2018-05-06 DIAGNOSIS — R5381 Other malaise: Secondary | ICD-10-CM | POA: Diagnosis not present

## 2018-05-06 DIAGNOSIS — M6281 Muscle weakness (generalized): Secondary | ICD-10-CM | POA: Diagnosis not present

## 2018-05-06 DIAGNOSIS — Z880 Allergy status to penicillin: Secondary | ICD-10-CM | POA: Diagnosis not present

## 2018-05-06 DIAGNOSIS — K5792 Diverticulitis of intestine, part unspecified, without perforation or abscess without bleeding: Secondary | ICD-10-CM | POA: Diagnosis not present

## 2018-05-06 DIAGNOSIS — Z743 Need for continuous supervision: Secondary | ICD-10-CM | POA: Diagnosis not present

## 2018-05-06 DIAGNOSIS — Z882 Allergy status to sulfonamides status: Secondary | ICD-10-CM | POA: Diagnosis not present

## 2018-05-06 DIAGNOSIS — G8918 Other acute postprocedural pain: Secondary | ICD-10-CM | POA: Diagnosis present

## 2018-05-06 DIAGNOSIS — I5032 Chronic diastolic (congestive) heart failure: Secondary | ICD-10-CM | POA: Diagnosis not present

## 2018-05-06 LAB — CBC
HCT: 32.6 % — ABNORMAL LOW (ref 36.0–46.0)
Hemoglobin: 10.4 g/dL — ABNORMAL LOW (ref 12.0–15.0)
MCH: 29.1 pg (ref 26.0–34.0)
MCHC: 31.9 g/dL (ref 30.0–36.0)
MCV: 91.3 fL (ref 78.0–100.0)
Platelets: 358 10*3/uL (ref 150–400)
RBC: 3.57 MIL/uL — ABNORMAL LOW (ref 3.87–5.11)
RDW: 15.1 % (ref 11.5–15.5)
WBC: 12.5 10*3/uL — ABNORMAL HIGH (ref 4.0–10.5)

## 2018-05-06 LAB — BASIC METABOLIC PANEL
Anion gap: 11 (ref 5–15)
BUN: 21 mg/dL (ref 8–23)
CO2: 27 mmol/L (ref 22–32)
Calcium: 9 mg/dL (ref 8.9–10.3)
Chloride: 102 mmol/L (ref 98–111)
Creatinine, Ser: 1.69 mg/dL — ABNORMAL HIGH (ref 0.44–1.00)
GFR calc Af Amer: 34 mL/min — ABNORMAL LOW (ref >60)
GFR calc non Af Amer: 29 mL/min — ABNORMAL LOW (ref >60)
Glucose, Bld: 96 mg/dL (ref 70–99)
Potassium: 3.7 mmol/L (ref 3.5–5.1)
Sodium: 140 mmol/L (ref 135–145)

## 2018-05-06 LAB — MAGNESIUM: Magnesium: 1.7 mg/dL (ref 1.7–2.4)

## 2018-05-06 MED ORDER — METRONIDAZOLE 500 MG PO TABS: 500.0000 mg | ORAL_TABLET | Freq: Three times a day (TID) | ORAL | 0 refills | Status: AC

## 2018-05-06 MED ORDER — PREDNISONE 5 MG PO TABS: ORAL_TABLET | ORAL | Status: DC

## 2018-05-06 MED ORDER — CIPROFLOXACIN HCL 500 MG PO TABS: 500.0000 mg | ORAL_TABLET | Freq: Two times a day (BID) | ORAL | Status: AC

## 2018-05-06 MED ORDER — ACETAMINOPHEN 325 MG PO TABS: 650.0000 mg | ORAL_TABLET | Freq: Four times a day (QID) | ORAL | Status: AC | PRN

## 2018-05-06 MED ORDER — NUCYNTA 50 MG PO TABS: 50.0000 mg | ORAL_TABLET | Freq: Three times a day (TID) | ORAL | 0 refills | Status: DC | PRN

## 2018-05-06 NOTE — Plan of Care (Signed)
Pt c/o of severe LLE pain associated with movement. Will continue to monitor.

## 2018-05-06 NOTE — Clinical Social Work Placement (Signed)
   CLINICAL SOCIAL WORK PLACEMENT  NOTE  Date:  05/06/2018  Patient Details  Name: Nicole [name] MRN: [id number] Date of Birth: [birth year]  Clinical Social Work is seeking post-discharge placement for this patient at the [place] level of care (*CSW will initial, date and re-position this form in  chart as items are completed):  Yes   Patient/family provided with [facility] Work Department's list of facilities offering this level of care within the geographic area requested by the patient (or if unable, by the patient's family).  Yes   Patient/family informed of their freedom to choose among providers that offer the needed level of care, that participate in Medicare, Medicaid or managed care program needed by the patient, have an available bed and are willing to accept the patient.  Yes   Patient/family informed of [facility]'s ownership interest in [facility] and [facility], as well as of the fact that they are under no obligation to receive care at these facilities.  PASRR submitted to EDS on 05/04/18     PASRR number received on 05/04/18     Existing PASRR number confirmed on       FL2 transmitted to all facilities in geographic area requested by pt/family on 05/04/18     FL2 transmitted to all facilities within larger geographic area on       Patient informed that his/her managed care company has contracts with or will negotiate with certain facilities, including the following:        Yes   Patient/family informed of bed offers received.  Patient chooses bed at [facility]     Physician recommends and patient chooses bed at      Patient to be transferred to [facility] on 05/06/18.  Patient to be transferred to facility by daughter     Patient family notified on 05/06/18 of transfer.  Name of family member notified:  daughter and son     PHYSICIAN       Additional Comment:  Facility notified. Discharge clinicals  sent. LCSW signing off.   _______________________________________________ Ihor Gully, LCSW 05/06/2018, 3:39 PM

## 2018-05-06 NOTE — Care Management Important Message (Signed)
Important Message  Patient Details  Name: Nicole [name] MRN: [id number] Date of Birth: May 08, 1946   Medicare Important Message Given:  Yes    Shelda Altes 05/06/2018, 11:46 AM

## 2018-05-06 NOTE — Progress Notes (Signed)
Called report to [facility] nurse. IV removed and site intact. Patient sent with all personal belongings and home medications returned to her from pharmacy. EMS picked up patient and transported to [facility].

## 2018-05-06 NOTE — Discharge Summary (Signed)
Physician Discharge Summary  Nicole [name] PYP:[id number] DOB: [birth year] DOA: 05/01/2018  PCP: Sharilyn Sites, MD  Admit date: 05/01/2018 Discharge date: 05/06/2018  Time spent: 35 minutes  Recommendations for Outpatient Follow-up:  1. Repeat basic metabolic panel to follow electrolytes and renal function 2. Outpatient follow-up with gastroenterologist for-6 weeks for colonoscopy. 3. Outpatient follow-up with neurosurgery for further evaluation and definitive treatment of her spinal stenosis.   4. Reassess blood pressure and further adjust antihypertensive regimen as needed 5. Outpatient follow-up with rheumatologist for further treatment decision on her PMR.   Discharge Diagnoses:  Principal Problem:   Diverticulitis of colon Active Problems:   GERD (gastroesophageal reflux disease)   Spinal stenosis of lumbosacral region   HTN (hypertension)   Chronic pain syndrome   Polymyalgia rheumatica (HCC)   Morbid obesity with BMI of 50.0-59.9, adult (HCC)   Chronic diastolic HF (heart failure) ([place])   Diverticulitis   Discharge Condition: Stable and improved.  Discharge to skilled nursing facility for further care and rehabilitation.  Diet recommendation: Heart healthy, low residue diet.  Filed Weights   05/04/18 0437 05/05/18 0539 05/06/18 0614  Weight: 124.4 kg (274 lb 4 oz) 123.1 kg (271 lb 6.2 oz) 123.9 kg (273 lb 2.4 oz)    History of present illness:  72 y.o.femalewith significant PMH for HTN, CKD stage 3, chronic diastolic HF, morbid Obesity, GERD, diverticulosis and PMR (on chronic prednisone); who presented to ED with complaints of abd pain, anorexia and nausea. Symptoms has been present for the last week or so; patient reported pain as intermittent, crampy in nature, localized in her LLQ, no radiating, associated with nausea (but no vomiting) and anorexia. She expressed pain at its worst is 8-9/10 and was worsen by eating. Patient also reported intermittent episodes of  BRBPR. She denies CP, SOB, HA's, focal weakness, dysuria, hematemesis and hematuria.  Hospital Course:  1-Diverticulitis large intestine w/o perforation or abscess w bleeding -continue antibiotics by mouth (5/10); will need 5 more days at discharge of antibiotics to complete regimen. -tolerating soft diet -no further bleeding appreciated -very mild LLQ pain on exam  -Continue PRN analgesics. -Outpatient follow up with GI in 4-6 weeks for colonoscopy   2-Acute on chronic anemia -in the setting of diverticulitis -no transfusion needed  -Repeat CBC at follow-up visit to reassess hemoglobin trend.   -No further signs of bleeding appreciated.  3-GERD (gastroesophageal reflux disease) -will continue PPI  4-HTN (hypertension) -stable, overall -resume home antihypertensive regimen.   -Advised to follow heart healthy/low-sodium diet.    5-Chronic pain syndrome and severe DDD/spinal stenosis  -continue Nucynta as per home orders -PRN tylenol and resumption of topical diclofenac  -CT scan demonstrated severe spinal stenosis and will need follow up with neurosurgery for definitive treatment.  -discharge to SNF for further care and rehab.     6-Polymyalgia rheumatica ([place]) -continue steroids, patient on tapering process -will Continue using 15 mg daily for 1 week and then continue tapering as instructed by rheumatologist. -continue Plaquenil.  7-Morbid obesity with BMI of 50.0-59.9, adult (HCC) -low calorie diet and portion control discussed with patient. -will benefit of eval at bariatric clinic.  8-Chronic diastolic HF (heart failure) (HCC) -has remained compensated -continue lasix, low-sodium diet and daily weights -advised to maintain adequate hydration   9-acute on chronic renal failure -Cr stable/improved after gentle IVF's and holding diuretics. -follow trend with basic metabolic panel follow-up visit. -At discharge okay to resume home diuretics.  10-Physical  deconditioning/spinal stenosis. -After discussing with daughter  and patient once again they have agreed to pursued skilled nursing facility placement for further care and rehabilitation. -Continue as needed analgesics. -Outpatient follow-up with neurosurgery for definitive treatment of her lumbosacral spinal stenosis.  11-transient episode of urinary retention -Most likely associated with the use of extra analgesics and Robaxin -In and out catheter x1 done during hospitalization. -Patient has been able to void after that without any problem. -Advised to maintain adequate hydration and to participate with physical therapy for rehabilitation.  12-hypokalemia/hypomagnesemia -Most likely associated with diuretic regimen -Repleted and patient discharged with oral supplementation. -Repeat basic metabolic panel to follow electrolytes trend.    Procedures:  See below for x-ray reports   Consultations:  None   Discharge Exam: Vitals:   05/06/18 0614 05/06/18 1310  BP: 108/71 122/81  Pulse: 97 (!) 104  Resp: 18 18  Temp: 98.2 F (36.8 C) 98.8 F (37.1 C)  SpO2: 97% 92%    General exam: Alert, awake, oriented x 3; reports improvement in her abdominal discomfort, tolerating diet also having less pain on her left side.  Patient without nausea, vomiting, or further signs of bleeding in her stools.  Calm her and feeling better.  In agreement to go to skilled nursing facility for rehabilitation. Respiratory system: Clear to auscultation. Respiratory effort normal. Cardiovascular system:RRR. No murmurs, rubs, gallops. Gastrointestinal system: Abdomen is nondistended, soft and with mild tenderness on palpation on her LLQ. Normal bowel sounds heard. Central nervous system: Alert and oriented. No focal neurological deficits. Extremities: No Cyanosis, no clubbing.  Trace edema bilaterally appreciated. Skin: Venous stasis dermatitis appreciated, wounds on her legs without any purulent  drainage. Psychiatry: Judgement and insight appear normal. Mood & affect appropriate.      Discharge Instructions   Discharge Instructions    (HEART FAILURE PATIENTS) Call MD:  Anytime you have any of the following symptoms: 1) 3 pound weight gain in 24 hours or 5 pounds in 1 week 2) shortness of breath, with or without a dry hacking cough 3) swelling in the hands, feet or stomach 4) if you have to sleep on extra pillows at night in order to breathe.   Complete by:  As directed    Diet - low sodium heart healthy   Complete by:  As directed    Discharge instructions   Complete by:  As directed    Take medications as prescribed Follow soft diet with low residue's and follow heart healthy/low-sodium Check weight on daily basis Maintain adequate hydration Arrange follow up with PCP in 10 days     Allergies as of 05/06/2018      Reactions   Oxycontin [oxycodone Hcl] Swelling   Pt tolerates hydromorphone.   Sulfa Antibiotics Other (See Comments)   Sores   Tramadol    "Makes me feel like I am falling"   Penicillins Rash   Sulfasalazine Other (See Comments)   Sores      Medication List    STOP taking these medications   losartan-hydrochlorothiazide 100-25 MG tablet Commonly known as:  HYZAAR     TAKE these medications   acetaminophen 325 MG tablet Commonly known as:  TYLENOL Take 2 tablets (650 mg total) by mouth every 6 (six) hours as needed for mild pain or headache (or Fever >/= 101).   ciprofloxacin 500 MG tablet Commonly known as:  CIPRO Take 1 tablet (500 mg total) by mouth 2 (two) times daily for 5 days.   furosemide 40 MG tablet Commonly known as:  LASIX  Take 1 tablet (40 mg total) by mouth daily.   gabapentin 100 MG capsule Commonly known as:  NEURONTIN Take 2 capsules (200 mg total) by mouth 3 (three) times daily. [id number]   hydroxychloroquine 200 MG tablet Commonly known as:  PLAQUENIL TAKE 1 TABLET IN THE MORNING AND 1 TABLET IN THE EVENING, MONDAY  THROUGH FRIDAY ONLY. NONE ON SATURDAY OR SUNDAY   metroNIDAZOLE 500 MG tablet Commonly known as:  FLAGYL Take 1 tablet (500 mg total) by mouth every 8 (eight) hours for 5 days.   NONFORMULARY OR COMPOUNDED ITEM Apply 1-2 application topically every 8 (eight) hours as needed (Diclofenac 3%, Lidocaine 5%, Menthol 1%     (Cream 100 Gm)).   NUCYNTA 50 MG tablet Generic drug:  tapentadol Take 1 tablet (50 mg total) by mouth 3 (three) times daily as needed for moderate pain.   pantoprazole 40 MG tablet Commonly known as:  PROTONIX Take 40 mg by mouth daily.   potassium chloride SA 20 MEQ tablet Commonly known as:  K-DUR,KLOR-CON Take 1 tablet (20 mEq total) by mouth daily.   predniSONE 5 MG tablet Commonly known as:  DELTASONE Take 3 tablets by mouth daily for 1 week; then 2.5  tablets by mouth daily X 2 weeks; then 2 tablets by mouth daily X 2 weeks; then 1 tablet by mouth daily X 2 weeks and follow up with rheumatologist for further instructions. What changed:  additional instructions      Allergies  Allergen Reactions  . Oxycontin [Oxycodone Hcl] Swelling    Pt tolerates hydromorphone.  . Sulfa Antibiotics Other (See Comments)    Sores  . Tramadol     "Makes me feel like I am falling"  . Penicillins Rash  . Sulfasalazine Other (See Comments)    Sores   Follow-up Information    Sharilyn Sites, MD. Schedule an appointment as soon as possible for a visit in 2 week(s).   Specialty:  Family Medicine Contact information: [address] [place] [address] [phone]        Herminio Commons, MD .   Specialty:  Cardiology Contact information: [place] [address] [phone]           The results of significant diagnostics from this hospitalization (including imaging, microbiology, ancillary and laboratory) are listed below for reference.    Significant Diagnostic Studies: Ct Abdomen Pelvis Wo Contrast  Result Date:  05/01/2018 CLINICAL DATA:  Flank pain and rectal bleeding. Question abdominal aortic aneurysm. EXAM: CT ABDOMEN AND PELVIS WITHOUT CONTRAST TECHNIQUE: Multidetector CT imaging of the abdomen and pelvis was performed following the standard protocol without IV contrast. COMPARISON:  Abdominal CT 06/02/2016 FINDINGS: Lower chest: Mild right greater than left lung base atelectasis. No pleural fluid. No consolidation. Borderline cardiomegaly. Coronary artery calcifications. Hepatobiliary: Suggestion of nodular hepatic contours. No discrete focal lesion on noncontrast exam. Clips in the gallbladder fossa postcholecystectomy. No biliary dilatation. Pancreas: No ductal dilatation or inflammation. Spleen: Normal in size without focal abnormality. Adrenals/Urinary Tract: No adrenal nodule. No hydronephrosis or perinephric edema. No urolithiasis. Both ureters are decompressed without stones along the course. Urinary bladder is partially distended, no bladder wall thickening. Stomach/Bowel: Inflamed diverticulum in the sigmoid colon with pericolonic edema and fat stranding consistent with diverticulitis. No perforation or abscess. Additional noninflamed diverticula from the splenic flexure distally. No small bowel wall thickening, inflammatory change or obstruction. Normal appendix. Stomach is nondistended, small hiatal hernia. Vascular/Lymphatic: Aortic atherosclerosis without aneurysm. Aortic tortuosity. No enlarged  abdominal or pelvic lymph nodes. Reproductive: High-density 4 cm left adnexal structure may be a pedunculated fibroid or left ovary, this is unchanged in appearance from prior exam. Uterus is unremarkable. Right ovary is quiescent. Other: No free air, free fluid or intra-abdominal abscess. Small fat containing umbilical hernia. Musculoskeletal: Diffuse degenerative change throughout the spine with vacuum phenomenon and near of the level and prominent facet arthropathy. Moderate to advanced degenerative change of  the left hip. There are no acute or suspicious osseous abnormalities. IMPRESSION: 1. Acute uncomplicated sigmoid diverticulitis. 2. Aortic atherosclerosis without aneurysm Aortic Atherosclerosis (ICD10-I70.0). Coronary artery calcifications, incidental. 3. Suggestion of nodular hepatic contours raising concern for cirrhosis. Recommend correlation with cirrhosis risk factors. Electronically Signed   By: Jeb Levering M.D.   On: 05/01/2018 06:55   Dg Chest 2 View  Result Date: 04/16/2018 CLINICAL DATA:  Soft tissue swelling. EXAM: CHEST - 2 VIEW COMPARISON:  Chest x-ray 12/31/2013. FINDINGS: Cardiomegaly with bilateral from interstitial prominence suggesting CHF. No pleural effusion or pneumothorax. No acute bony abnormality. IMPRESSION: Cardiomegaly with mild bilateral pulmonary interstitial prominence suggesting interstitial edema/CHF. Electronically Signed   By: Marcello Moores  Register   On: 04/16/2018 14:22   Ct Lumbar Spine Wo Contrast  Result Date: 05/05/2018 CLINICAL DATA:  Severe left leg pain. EXAM: CT LUMBAR SPINE WITHOUT CONTRAST TECHNIQUE: Multidetector CT imaging of the lumbar spine was performed without intravenous contrast administration. Multiplanar CT image reconstructions were also generated. COMPARISON:  CT abdomen 05/01/2018.  MRI 09/29/2017. FINDINGS: Segmentation: 5 lumbar type vertebral bodies. S1 has some transitional features. Alignment: Mild curvature convex to the left. No antero or retrolisthesis. Straightening of the normal lumbar lordosis. Vertebrae: No fracture. Chronic endplate changes secondary to degenerative disc disease. Paraspinal and other soft tissues: Aortic atherosclerosis. Otherwise negative. Disc levels: T12-L1: Chronic disc degeneration with vacuum phenomenon. No apparent compressive stenosis of the canal or foramina. L1-2: Chronic disc degeneration with vacuum phenomenon. Endplate osteophytes more prominent towards the right. Facet degeneration and hypertrophy. Lateral  recess and foraminal stenosis right worse than left. Findings appear chronic. L2-3: Chronic disc degeneration with vacuum phenomenon. Endplate osteophytes. Facet and ligamentous hypertrophy. Chronic narrowing of the lateral recesses and foramina. No apparent change. L3-4: Chronic disc degeneration with vacuum phenomenon. Endplate osteophytes. Facet and ligamentous hypertrophy. Newly seen extruded nitrogen gas migrated caudally in the left side of the spinal canal behind L4. This could be accompanied by some disc material. Bilateral facet arthropathy. Spinal stenosis and neural foraminal stenosis at this level which could be symptomatic. L4-5: Chronic disc degeneration with vacuum phenomenon. Endplate osteophytes. Facet degeneration and hypertrophy. Severe multifactorial stenosis of the canal and foramina at this level. L5-S1: Chronic disc degeneration with lesser vacuum phenomenon. Endplate osteophytes and bulging of the disc more pronounced towards the left. Facet degeneration and hypertrophy worse on the left. No central canal stenosis. Left foraminal stenosis could compress the exiting L5 nerve. IMPRESSION: Advanced chronic degenerative disease throughout the lower thoracic and lumbar spine. The discs are severely degenerated with vacuum phenomenon. There are endplate osteophytes and there is facet degeneration and hypertrophy. As far as a change, at the L3-4 level there appears to be some extruded nitrogen gas which has migrated caudally behind L4 to the left of midline. There is probably some associated disc material. This could be the cause of the left-sided clinical change. However, there is also canal and foraminal stenosis at the L3-4 disc level, very severe spinal stenosis of the canal and foramina at the L4-5 level and severe  left foraminal stenosis at the L5-S1 level that appear chronic and could be responsible for the clinical symptoms. Lesser but still potentially significant degenerative changes noted  at L1-2 and L2-3 as noted above. Electronically Signed   By: Nelson Chimes M.D.   On: 05/05/2018 17:40   Labs: Basic Metabolic Panel: Recent Labs  Lab 05/01/18 0619 05/02/18 0517 05/04/18 0607 05/04/18 0843 05/05/18 0633 05/06/18 0722  NA 139 140 140  --  138 140  K 3.8 3.8 3.1*  --  3.3* 3.7  CL 100 105 103  --  102 102  CO2 28 26 28   --  25 27  GLUCOSE 130* 100* 88  --  94 96  BUN 44* 33* 20  --  18 21  CREATININE 2.15* 1.56* 1.47*  --  1.44* 1.69*  CALCIUM 8.9 8.8* 8.9  --  8.9 9.0  MG  --   --   --  1.5*  --  1.7   Liver Function Tests: Recent Labs  Lab 05/01/18 0619  AST 19  ALT 21  ALKPHOS 68  BILITOT 0.9  PROT 6.4*  ALBUMIN 3.0*   CBC: Recent Labs  Lab 04/30/18 1610 05/01/18 0619 05/02/18 0517 05/04/18 0607 05/06/18 0722  WBC  --  16.5* 18.2* 10.6* 12.5*  NEUTROABS  --  12.7*  --   --   --   HGB 11.1* 10.1* 9.2* 9.4* 10.4*  HCT 32.5* 31.3* 27.7* 28.9* 32.6*  MCV  --  91.8 90.8 90.6 91.3  PLT  --  297 296 324 358    Signed:  Barton Dubois MD.  [facility] 05/06/2018, 2:39 PM

## 2018-05-08 DIAGNOSIS — K219 Gastro-esophageal reflux disease without esophagitis: Secondary | ICD-10-CM | POA: Diagnosis not present

## 2018-05-08 DIAGNOSIS — K5700 Diverticulitis of small intestine with perforation and abscess without bleeding: Secondary | ICD-10-CM | POA: Diagnosis not present

## 2018-05-08 DIAGNOSIS — I10 Essential (primary) hypertension: Secondary | ICD-10-CM | POA: Diagnosis not present

## 2018-05-08 DIAGNOSIS — D500 Iron deficiency anemia secondary to blood loss (chronic): Secondary | ICD-10-CM | POA: Diagnosis not present

## 2018-05-09 DIAGNOSIS — K5792 Diverticulitis of intestine, part unspecified, without perforation or abscess without bleeding: Secondary | ICD-10-CM | POA: Diagnosis not present

## 2018-05-09 DIAGNOSIS — I10 Essential (primary) hypertension: Secondary | ICD-10-CM | POA: Diagnosis not present

## 2018-05-09 DIAGNOSIS — N183 Chronic kidney disease, stage 3 (moderate): Secondary | ICD-10-CM | POA: Diagnosis not present

## 2018-05-09 DIAGNOSIS — M48062 Spinal stenosis, lumbar region with neurogenic claudication: Secondary | ICD-10-CM | POA: Diagnosis not present

## 2018-05-10 DIAGNOSIS — M48062 Spinal stenosis, lumbar region with neurogenic claudication: Secondary | ICD-10-CM | POA: Diagnosis not present

## 2018-05-15 DIAGNOSIS — K5792 Diverticulitis of intestine, part unspecified, without perforation or abscess without bleeding: Secondary | ICD-10-CM | POA: Diagnosis not present

## 2018-05-15 DIAGNOSIS — M5137 Other intervertebral disc degeneration, lumbosacral region: Secondary | ICD-10-CM | POA: Diagnosis not present

## 2018-05-15 DIAGNOSIS — M48062 Spinal stenosis, lumbar region with neurogenic claudication: Secondary | ICD-10-CM | POA: Diagnosis not present

## 2018-05-16 DIAGNOSIS — M47816 Spondylosis without myelopathy or radiculopathy, lumbar region: Secondary | ICD-10-CM | POA: Diagnosis not present

## 2018-05-16 DIAGNOSIS — M4807 Spinal stenosis, lumbosacral region: Secondary | ICD-10-CM | POA: Diagnosis not present

## 2018-05-16 DIAGNOSIS — M5126 Other intervertebral disc displacement, lumbar region: Secondary | ICD-10-CM | POA: Diagnosis not present

## 2018-05-16 DIAGNOSIS — M48061 Spinal stenosis, lumbar region without neurogenic claudication: Secondary | ICD-10-CM | POA: Diagnosis not present

## 2018-05-16 DIAGNOSIS — M5127 Other intervertebral disc displacement, lumbosacral region: Secondary | ICD-10-CM | POA: Diagnosis not present

## 2018-05-16 DIAGNOSIS — M5410 Radiculopathy, site unspecified: Secondary | ICD-10-CM | POA: Diagnosis not present

## 2018-05-23 DIAGNOSIS — G894 Chronic pain syndrome: Secondary | ICD-10-CM | POA: Diagnosis not present

## 2018-05-23 DIAGNOSIS — K5792 Diverticulitis of intestine, part unspecified, without perforation or abscess without bleeding: Secondary | ICD-10-CM | POA: Diagnosis not present

## 2018-05-29 DIAGNOSIS — M48062 Spinal stenosis, lumbar region with neurogenic claudication: Secondary | ICD-10-CM | POA: Diagnosis not present

## 2018-05-29 DIAGNOSIS — K5792 Diverticulitis of intestine, part unspecified, without perforation or abscess without bleeding: Secondary | ICD-10-CM | POA: Diagnosis not present

## 2018-05-29 DIAGNOSIS — M5137 Other intervertebral disc degeneration, lumbosacral region: Secondary | ICD-10-CM | POA: Diagnosis not present

## 2018-06-05 DIAGNOSIS — M5137 Other intervertebral disc degeneration, lumbosacral region: Secondary | ICD-10-CM | POA: Diagnosis not present

## 2018-06-05 DIAGNOSIS — M48062 Spinal stenosis, lumbar region with neurogenic claudication: Secondary | ICD-10-CM | POA: Diagnosis not present

## 2018-06-05 DIAGNOSIS — M353 Polymyalgia rheumatica: Secondary | ICD-10-CM | POA: Diagnosis not present

## 2018-06-05 DIAGNOSIS — N183 Chronic kidney disease, stage 3 (moderate): Secondary | ICD-10-CM | POA: Diagnosis not present

## 2018-06-06 DIAGNOSIS — K649 Unspecified hemorrhoids: Secondary | ICD-10-CM | POA: Diagnosis not present

## 2018-06-27 DIAGNOSIS — Z880 Allergy status to penicillin: Secondary | ICD-10-CM | POA: Diagnosis not present

## 2018-06-27 DIAGNOSIS — M5116 Intervertebral disc disorders with radiculopathy, lumbar region: Secondary | ICD-10-CM | POA: Diagnosis not present

## 2018-06-27 DIAGNOSIS — Z882 Allergy status to sulfonamides status: Secondary | ICD-10-CM | POA: Diagnosis not present

## 2018-06-27 DIAGNOSIS — M48062 Spinal stenosis, lumbar region with neurogenic claudication: Secondary | ICD-10-CM | POA: Diagnosis not present

## 2018-06-27 DIAGNOSIS — Z01818 Encounter for other preprocedural examination: Secondary | ICD-10-CM | POA: Diagnosis not present

## 2018-06-27 DIAGNOSIS — M419 Scoliosis, unspecified: Secondary | ICD-10-CM | POA: Diagnosis not present

## 2018-06-27 DIAGNOSIS — Z885 Allergy status to narcotic agent status: Secondary | ICD-10-CM | POA: Diagnosis not present

## 2018-06-27 NOTE — Progress Notes (Deleted)
Office Visit Note  Patient: Nicole [name]             Date of Birth: 07-14-1946           MRN: [id number]             PCP: Sharilyn Sites, MD Referring: Sharilyn Sites, MD Visit Date: 07/11/2018 Occupation: @GUAROCC @  Subjective:  No chief complaint on file.   History of Present Illness: Nicole [name] is a 72 y.o. female ***   Activities of Daily Living:  Patient reports morning stiffness for *** {minute/hour:19697}.   Patient {ACTIONS;DENIES/REPORTS:21021675::"Denies"} nocturnal pain.  Difficulty dressing/grooming: {ACTIONS;DENIES/REPORTS:21021675::"Denies"} Difficulty climbing stairs: {ACTIONS;DENIES/REPORTS:21021675::"Denies"} Difficulty getting out of chair: {ACTIONS;DENIES/REPORTS:21021675::"Denies"} Difficulty using hands for taps, buttons, cutlery, and/or writing: {ACTIONS;DENIES/REPORTS:21021675::"Denies"}  No Rheumatology ROS completed.   PMFS History:  Patient Active Problem List   Diagnosis Date Noted  . Diverticulitis 05/02/2018  . Diverticulitis of colon 05/01/2018  . Morbid obesity with BMI of 50.0-59.9, adult ([place]) 05/01/2018  . Chronic diastolic HF (heart failure) ([place]) 05/01/2018  . On prednisone therapy 05/31/2017  . Positive anti-CCP test 05/31/2017  . Rheumatoid factor positive 05/31/2017  . Memory loss 04/04/2017  . Polymyalgia rheumatica ([place]) 04/04/2017  . High risk medication use 12/07/2016  . DDD (degenerative disc disease), lumbar 09/27/2016  . HTN (hypertension) 06/05/2016  . Chronic pain syndrome 06/05/2016  . Acute diverticulitis 06/02/2016  . Diverticulitis large intestine 06/02/2016  . Spinal stenosis of lumbosacral region 02/29/2016  . Joint pain 02/29/2016  . Endometrial polyp 02/06/2014  . Postmenopausal bleeding 01/30/2014  . GERD (gastroesophageal reflux disease) 09/29/2013  . Spinal stenosis, thoracic 09/29/2013  . Shingles 09/29/2013  . Thoracic or lumbosacral neuritis or radiculitis, unspecified 05/04/2011  . Abnormality of gait  05/04/2011  . Muscle weakness (generalized) 05/04/2011  . Bilateral primary osteoarthritis of knee 04/07/2009  . KNEE PAIN 04/07/2009    Past Medical History:  Diagnosis Date  . AC (acromioclavicular) joint bone spurs    lt shoulder  . Anemia   . Arthritis   . Carpal tunnel syndrome, bilateral   . Gastroesophageal reflux   . Headache    recent visit to ER @ [facility] for severe headache  . Hypertension   . Lumbar stenosis    Hx of ESIs by Dr. Nelva Bush  . Polyarthralgia   . Polymyalgia ([place])   . Shingles   . Spinal stenosis     Family History  Problem Relation Age of Onset  . Hypertension Mother   . Pneumonia Father   . Arthritis Sister   . Diabetes Paternal Grandmother    Past Surgical History:  Procedure Laterality Date  . CHOLECYSTECTOMY    . COLONOSCOPY  06/11/2012   Procedure: COLONOSCOPY;  Surgeon: Jamesetta So, MD;  [facility];  Service: Gastroenterology;  Laterality: N/A;  . HYSTEROSCOPY W/D&C N/A 02/25/2014   Procedure: DILATATION AND CURETTAGE /HYSTEROSCOPY;  Surgeon: Florian Buff, MD;  Location: [facility];  Service: Gynecology;  Laterality: N/A;  . POLYPECTOMY N/A 02/25/2014   Procedure: POLYPECTOMY;  Surgeon: Florian Buff, MD;  Location: [facility];  Service: Gynecology;  Laterality: N/A;  . RESECTION DISTAL CLAVICAL Right 03/26/2015   Procedure: OPEN DISTAL CLAVICAL RESECTION ;  Surgeon: Netta Cedars, MD;  Location: [place];  Service: Orthopedics;  Laterality: Right;   Social History   Social History Narrative   Lives at home alone.   Right-handed.   1-2 cups caffeine daily.    Objective: Vital Signs: There were no vitals  taken for this visit.   Physical Exam   Musculoskeletal Exam: ***  CDAI Exam: CDAI Score: Not documented Patient Global Assessment: Not documented; Provider Global Assessment: Not documented Swollen: Not documented; Tender: Not documented Joint Exam   Not documented   There is currently no information documented on the  homunculus. Go to the Rheumatology activity and complete the homunculus joint exam.  Investigation: No additional findings.  Imaging: No results found.  Recent Labs: Lab Results  Component Value Date   WBC 12.5 (H) 05/06/2018   HGB 10.4 (L) 05/06/2018   PLT 358 05/06/2018   NA 140 05/06/2018   K 3.7 05/06/2018   CL 102 05/06/2018   CO2 27 05/06/2018   GLUCOSE 96 05/06/2018   BUN 21 05/06/2018   CREATININE 1.69 (H) 05/06/2018   BILITOT 0.9 05/01/2018   ALKPHOS 68 05/01/2018   AST 19 05/01/2018   ALT 21 05/01/2018   PROT 6.4 (L) 05/01/2018   ALBUMIN 3.0 (L) 05/01/2018   CALCIUM 9.0 05/06/2018   GFRAA 34 (L) 05/06/2018    Speciality Comments: PLQ Eye Exam: 12/13/17 WNl @ [facility]  Procedures:  No procedures performed Allergies: Oxycontin [oxycodone hcl]; Sulfa antibiotics; Tramadol; Penicillins; and Sulfasalazine   Assessment / Plan:     Visit Diagnoses: Polymyalgia rheumatica ([place])  Temporal arteritis ([place])  On prednisone therapy - She is currently on prednisone 20 mg daily, start tapering 2.5 mg every 2 weeks. once on Prednisone 10 mg continue on taking 10 mg daily  High risk medication use -  PLQ.eye exam: 12/13/2017.  She is on Plaquenil 200 mg twice daily Monday through Friday  Rheumatoid factor positive  Chronic pain syndrome  Positive anti-CCP test  Primary osteoarthritis of both knees  DDD (degenerative disc disease), lumbar  Spinal stenosis, thoracic  Age-related osteoporosis without current pathological fracture  Vitamin D deficiency  History of diverticulitis  History of hypertension  History of gastroesophageal reflux (GERD)  History of chronic kidney disease   Orders: No orders of the defined types were placed in this encounter.  No orders of the defined types were placed in this encounter.   Face-to-face time spent with patient was *** minutes. Greater than 50% of time was spent in counseling and coordination of  care.  Follow-Up Instructions: No follow-ups on file.   Ofilia Neas, PA-C  Note - This record has been created using Dragon software.  Chart creation errors have been sought, but may not always  have been located. Such creation errors do not reflect on  the standard of medical care.

## 2018-07-01 DIAGNOSIS — G894 Chronic pain syndrome: Secondary | ICD-10-CM | POA: Diagnosis not present

## 2018-07-01 DIAGNOSIS — I5032 Chronic diastolic (congestive) heart failure: Secondary | ICD-10-CM | POA: Diagnosis not present

## 2018-07-01 DIAGNOSIS — M48062 Spinal stenosis, lumbar region with neurogenic claudication: Secondary | ICD-10-CM | POA: Diagnosis not present

## 2018-07-01 DIAGNOSIS — M5137 Other intervertebral disc degeneration, lumbosacral region: Secondary | ICD-10-CM | POA: Diagnosis not present

## 2018-07-03 DIAGNOSIS — N183 Chronic kidney disease, stage 3 (moderate): Secondary | ICD-10-CM | POA: Diagnosis not present

## 2018-07-03 DIAGNOSIS — D649 Anemia, unspecified: Secondary | ICD-10-CM | POA: Diagnosis not present

## 2018-07-03 DIAGNOSIS — M5442 Lumbago with sciatica, left side: Secondary | ICD-10-CM | POA: Diagnosis not present

## 2018-07-03 DIAGNOSIS — Z87891 Personal history of nicotine dependence: Secondary | ICD-10-CM | POA: Diagnosis not present

## 2018-07-03 DIAGNOSIS — Z6841 Body Mass Index (BMI) 40.0 and over, adult: Secondary | ICD-10-CM | POA: Diagnosis not present

## 2018-07-03 DIAGNOSIS — M48061 Spinal stenosis, lumbar region without neurogenic claudication: Secondary | ICD-10-CM | POA: Diagnosis not present

## 2018-07-03 DIAGNOSIS — Z01818 Encounter for other preprocedural examination: Secondary | ICD-10-CM | POA: Diagnosis not present

## 2018-07-03 DIAGNOSIS — M069 Rheumatoid arthritis, unspecified: Secondary | ICD-10-CM | POA: Diagnosis not present

## 2018-07-03 DIAGNOSIS — Z7409 Other reduced mobility: Secondary | ICD-10-CM | POA: Diagnosis not present

## 2018-07-03 DIAGNOSIS — I5030 Unspecified diastolic (congestive) heart failure: Secondary | ICD-10-CM | POA: Diagnosis not present

## 2018-07-05 DIAGNOSIS — M069 Rheumatoid arthritis, unspecified: Secondary | ICD-10-CM | POA: Diagnosis not present

## 2018-07-05 DIAGNOSIS — J9811 Atelectasis: Secondary | ICD-10-CM | POA: Diagnosis not present

## 2018-07-05 DIAGNOSIS — Z9981 Dependence on supplemental oxygen: Secondary | ICD-10-CM | POA: Diagnosis not present

## 2018-07-05 DIAGNOSIS — M549 Dorsalgia, unspecified: Secondary | ICD-10-CM | POA: Diagnosis not present

## 2018-07-05 DIAGNOSIS — M961 Postlaminectomy syndrome, not elsewhere classified: Secondary | ICD-10-CM | POA: Diagnosis not present

## 2018-07-05 DIAGNOSIS — M4807 Spinal stenosis, lumbosacral region: Secondary | ICD-10-CM | POA: Diagnosis not present

## 2018-07-05 DIAGNOSIS — M5489 Other dorsalgia: Secondary | ICD-10-CM | POA: Diagnosis not present

## 2018-07-05 DIAGNOSIS — R109 Unspecified abdominal pain: Secondary | ICD-10-CM | POA: Diagnosis not present

## 2018-07-05 DIAGNOSIS — I110 Hypertensive heart disease with heart failure: Secondary | ICD-10-CM | POA: Diagnosis not present

## 2018-07-05 DIAGNOSIS — R300 Dysuria: Secondary | ICD-10-CM | POA: Diagnosis not present

## 2018-07-05 DIAGNOSIS — I10 Essential (primary) hypertension: Secondary | ICD-10-CM | POA: Diagnosis not present

## 2018-07-05 DIAGNOSIS — Z9889 Other specified postprocedural states: Secondary | ICD-10-CM | POA: Diagnosis not present

## 2018-07-05 DIAGNOSIS — K5900 Constipation, unspecified: Secondary | ICD-10-CM | POA: Diagnosis not present

## 2018-07-05 DIAGNOSIS — G894 Chronic pain syndrome: Secondary | ICD-10-CM | POA: Diagnosis not present

## 2018-07-05 DIAGNOSIS — I130 Hypertensive heart and chronic kidney disease with heart failure and stage 1 through stage 4 chronic kidney disease, or unspecified chronic kidney disease: Secondary | ICD-10-CM | POA: Diagnosis not present

## 2018-07-05 DIAGNOSIS — R279 Unspecified lack of coordination: Secondary | ICD-10-CM | POA: Diagnosis not present

## 2018-07-05 DIAGNOSIS — R339 Retention of urine, unspecified: Secondary | ICD-10-CM | POA: Diagnosis not present

## 2018-07-05 DIAGNOSIS — I5030 Unspecified diastolic (congestive) heart failure: Secondary | ICD-10-CM | POA: Diagnosis not present

## 2018-07-05 DIAGNOSIS — R52 Pain, unspecified: Secondary | ICD-10-CM | POA: Diagnosis not present

## 2018-07-05 DIAGNOSIS — K5903 Drug induced constipation: Secondary | ICD-10-CM | POA: Diagnosis not present

## 2018-07-05 DIAGNOSIS — I4510 Unspecified right bundle-branch block: Secondary | ICD-10-CM | POA: Diagnosis not present

## 2018-07-05 DIAGNOSIS — N184 Chronic kidney disease, stage 4 (severe): Secondary | ICD-10-CM | POA: Diagnosis not present

## 2018-07-05 DIAGNOSIS — I5032 Chronic diastolic (congestive) heart failure: Secondary | ICD-10-CM | POA: Diagnosis not present

## 2018-07-05 DIAGNOSIS — I2692 Saddle embolus of pulmonary artery without acute cor pulmonale: Secondary | ICD-10-CM | POA: Diagnosis not present

## 2018-07-05 DIAGNOSIS — M4800 Spinal stenosis, site unspecified: Secondary | ICD-10-CM | POA: Diagnosis not present

## 2018-07-05 DIAGNOSIS — R42 Dizziness and giddiness: Secondary | ICD-10-CM | POA: Diagnosis not present

## 2018-07-05 DIAGNOSIS — I959 Hypotension, unspecified: Secondary | ICD-10-CM | POA: Diagnosis not present

## 2018-07-05 DIAGNOSIS — M48062 Spinal stenosis, lumbar region with neurogenic claudication: Secondary | ICD-10-CM | POA: Diagnosis not present

## 2018-07-05 DIAGNOSIS — Z8669 Personal history of other diseases of the nervous system and sense organs: Secondary | ICD-10-CM | POA: Diagnosis not present

## 2018-07-05 DIAGNOSIS — R7989 Other specified abnormal findings of blood chemistry: Secondary | ICD-10-CM | POA: Diagnosis not present

## 2018-07-05 DIAGNOSIS — Z7952 Long term (current) use of systemic steroids: Secondary | ICD-10-CM | POA: Diagnosis not present

## 2018-07-05 DIAGNOSIS — R000 Tachycardia, unspecified: Secondary | ICD-10-CM | POA: Diagnosis not present

## 2018-07-05 DIAGNOSIS — M1990 Unspecified osteoarthritis, unspecified site: Secondary | ICD-10-CM | POA: Diagnosis present

## 2018-07-05 DIAGNOSIS — G629 Polyneuropathy, unspecified: Secondary | ICD-10-CM | POA: Diagnosis not present

## 2018-07-05 DIAGNOSIS — R0902 Hypoxemia: Secondary | ICD-10-CM | POA: Diagnosis not present

## 2018-07-05 DIAGNOSIS — K5700 Diverticulitis of small intestine with perforation and abscess without bleeding: Secondary | ICD-10-CM | POA: Diagnosis not present

## 2018-07-05 DIAGNOSIS — Z87448 Personal history of other diseases of urinary system: Secondary | ICD-10-CM | POA: Diagnosis not present

## 2018-07-05 DIAGNOSIS — Z87891 Personal history of nicotine dependence: Secondary | ICD-10-CM | POA: Diagnosis not present

## 2018-07-05 DIAGNOSIS — D500 Iron deficiency anemia secondary to blood loss (chronic): Secondary | ICD-10-CM | POA: Diagnosis not present

## 2018-07-05 DIAGNOSIS — M5137 Other intervertebral disc degeneration, lumbosacral region: Secondary | ICD-10-CM | POA: Diagnosis not present

## 2018-07-05 DIAGNOSIS — Z8679 Personal history of other diseases of the circulatory system: Secondary | ICD-10-CM | POA: Diagnosis not present

## 2018-07-05 DIAGNOSIS — I2602 Saddle embolus of pulmonary artery with acute cor pulmonale: Secondary | ICD-10-CM | POA: Diagnosis not present

## 2018-07-05 DIAGNOSIS — Z4789 Encounter for other orthopedic aftercare: Secondary | ICD-10-CM | POA: Diagnosis not present

## 2018-07-05 DIAGNOSIS — Z8709 Personal history of other diseases of the respiratory system: Secondary | ICD-10-CM | POA: Diagnosis not present

## 2018-07-05 DIAGNOSIS — M353 Polymyalgia rheumatica: Secondary | ICD-10-CM | POA: Diagnosis not present

## 2018-07-05 DIAGNOSIS — Z7901 Long term (current) use of anticoagulants: Secondary | ICD-10-CM | POA: Diagnosis not present

## 2018-07-05 DIAGNOSIS — K219 Gastro-esophageal reflux disease without esophagitis: Secondary | ICD-10-CM | POA: Diagnosis not present

## 2018-07-05 DIAGNOSIS — Z79899 Other long term (current) drug therapy: Secondary | ICD-10-CM | POA: Diagnosis not present

## 2018-07-05 DIAGNOSIS — J9601 Acute respiratory failure with hypoxia: Secondary | ICD-10-CM | POA: Diagnosis not present

## 2018-07-05 DIAGNOSIS — M6281 Muscle weakness (generalized): Secondary | ICD-10-CM | POA: Diagnosis not present

## 2018-07-05 DIAGNOSIS — G8918 Other acute postprocedural pain: Secondary | ICD-10-CM | POA: Diagnosis not present

## 2018-07-05 DIAGNOSIS — Z743 Need for continuous supervision: Secondary | ICD-10-CM | POA: Diagnosis not present

## 2018-07-05 DIAGNOSIS — G8929 Other chronic pain: Secondary | ICD-10-CM | POA: Diagnosis not present

## 2018-07-05 DIAGNOSIS — R262 Difficulty in walking, not elsewhere classified: Secondary | ICD-10-CM | POA: Diagnosis not present

## 2018-07-05 DIAGNOSIS — H5500 Unspecified nystagmus: Secondary | ICD-10-CM | POA: Diagnosis not present

## 2018-07-05 DIAGNOSIS — N183 Chronic kidney disease, stage 3 (moderate): Secondary | ICD-10-CM | POA: Diagnosis not present

## 2018-07-05 DIAGNOSIS — N390 Urinary tract infection, site not specified: Secondary | ICD-10-CM | POA: Diagnosis not present

## 2018-07-05 DIAGNOSIS — E876 Hypokalemia: Secondary | ICD-10-CM | POA: Diagnosis not present

## 2018-07-05 DIAGNOSIS — Z6841 Body Mass Index (BMI) 40.0 and over, adult: Secondary | ICD-10-CM | POA: Diagnosis not present

## 2018-07-05 DIAGNOSIS — Z7409 Other reduced mobility: Secondary | ICD-10-CM | POA: Diagnosis not present

## 2018-07-05 DIAGNOSIS — M545 Low back pain: Secondary | ICD-10-CM | POA: Diagnosis not present

## 2018-07-05 DIAGNOSIS — R252 Cramp and spasm: Secondary | ICD-10-CM | POA: Diagnosis not present

## 2018-07-05 DIAGNOSIS — N179 Acute kidney failure, unspecified: Secondary | ICD-10-CM | POA: Diagnosis not present

## 2018-07-05 DIAGNOSIS — T40605A Adverse effect of unspecified narcotics, initial encounter: Secondary | ICD-10-CM | POA: Diagnosis not present

## 2018-07-05 DIAGNOSIS — Z862 Personal history of diseases of the blood and blood-forming organs and certain disorders involving the immune mechanism: Secondary | ICD-10-CM | POA: Diagnosis not present

## 2018-07-06 DIAGNOSIS — I2602 Saddle embolus of pulmonary artery with acute cor pulmonale: Secondary | ICD-10-CM | POA: Insufficient documentation

## 2018-07-06 DIAGNOSIS — I2699 Other pulmonary embolism without acute cor pulmonale: Secondary | ICD-10-CM | POA: Insufficient documentation

## 2018-07-11 ENCOUNTER — Ambulatory Visit: Payer: Medicare Other | Admitting: Rheumatology

## 2018-07-15 DIAGNOSIS — T8131XA Disruption of external operation (surgical) wound, not elsewhere classified, initial encounter: Secondary | ICD-10-CM | POA: Diagnosis present

## 2018-07-15 DIAGNOSIS — N183 Chronic kidney disease, stage 3 (moderate): Secondary | ICD-10-CM | POA: Diagnosis not present

## 2018-07-15 DIAGNOSIS — I2602 Saddle embolus of pulmonary artery with acute cor pulmonale: Secondary | ICD-10-CM | POA: Diagnosis not present

## 2018-07-15 DIAGNOSIS — M353 Polymyalgia rheumatica: Secondary | ICD-10-CM | POA: Diagnosis not present

## 2018-07-15 DIAGNOSIS — R001 Bradycardia, unspecified: Secondary | ICD-10-CM | POA: Diagnosis not present

## 2018-07-15 DIAGNOSIS — M069 Rheumatoid arthritis, unspecified: Secondary | ICD-10-CM | POA: Diagnosis present

## 2018-07-15 DIAGNOSIS — K219 Gastro-esophageal reflux disease without esophagitis: Secondary | ICD-10-CM | POA: Diagnosis not present

## 2018-07-15 DIAGNOSIS — M48062 Spinal stenosis, lumbar region with neurogenic claudication: Secondary | ICD-10-CM | POA: Diagnosis not present

## 2018-07-15 DIAGNOSIS — M4807 Spinal stenosis, lumbosacral region: Secondary | ICD-10-CM | POA: Diagnosis not present

## 2018-07-15 DIAGNOSIS — D500 Iron deficiency anemia secondary to blood loss (chronic): Secondary | ICD-10-CM | POA: Diagnosis not present

## 2018-07-15 DIAGNOSIS — Z743 Need for continuous supervision: Secondary | ICD-10-CM | POA: Diagnosis not present

## 2018-07-15 DIAGNOSIS — H04123 Dry eye syndrome of bilateral lacrimal glands: Secondary | ICD-10-CM | POA: Diagnosis present

## 2018-07-15 DIAGNOSIS — K5700 Diverticulitis of small intestine with perforation and abscess without bleeding: Secondary | ICD-10-CM | POA: Diagnosis not present

## 2018-07-15 DIAGNOSIS — Z7952 Long term (current) use of systemic steroids: Secondary | ICD-10-CM | POA: Diagnosis not present

## 2018-07-15 DIAGNOSIS — Z7401 Bed confinement status: Secondary | ICD-10-CM | POA: Diagnosis not present

## 2018-07-15 DIAGNOSIS — I130 Hypertensive heart and chronic kidney disease with heart failure and stage 1 through stage 4 chronic kidney disease, or unspecified chronic kidney disease: Secondary | ICD-10-CM | POA: Diagnosis present

## 2018-07-15 DIAGNOSIS — T8141XA Infection following a procedure, superficial incisional surgical site, initial encounter: Secondary | ICD-10-CM | POA: Diagnosis present

## 2018-07-15 DIAGNOSIS — G8918 Other acute postprocedural pain: Secondary | ICD-10-CM | POA: Diagnosis not present

## 2018-07-15 DIAGNOSIS — R300 Dysuria: Secondary | ICD-10-CM | POA: Diagnosis not present

## 2018-07-15 DIAGNOSIS — I2692 Saddle embolus of pulmonary artery without acute cor pulmonale: Secondary | ICD-10-CM | POA: Diagnosis not present

## 2018-07-15 DIAGNOSIS — N390 Urinary tract infection, site not specified: Secondary | ICD-10-CM | POA: Diagnosis not present

## 2018-07-15 DIAGNOSIS — N179 Acute kidney failure, unspecified: Secondary | ICD-10-CM | POA: Diagnosis not present

## 2018-07-15 DIAGNOSIS — Z7901 Long term (current) use of anticoagulants: Secondary | ICD-10-CM | POA: Diagnosis not present

## 2018-07-15 DIAGNOSIS — R252 Cramp and spasm: Secondary | ICD-10-CM | POA: Diagnosis not present

## 2018-07-15 DIAGNOSIS — I959 Hypotension, unspecified: Secondary | ICD-10-CM | POA: Diagnosis not present

## 2018-07-15 DIAGNOSIS — M21372 Foot drop, left foot: Secondary | ICD-10-CM | POA: Diagnosis present

## 2018-07-15 DIAGNOSIS — G894 Chronic pain syndrome: Secondary | ICD-10-CM | POA: Diagnosis not present

## 2018-07-15 DIAGNOSIS — M6281 Muscle weakness (generalized): Secondary | ICD-10-CM | POA: Diagnosis not present

## 2018-07-15 DIAGNOSIS — M5137 Other intervertebral disc degeneration, lumbosacral region: Secondary | ICD-10-CM | POA: Diagnosis not present

## 2018-07-15 DIAGNOSIS — M961 Postlaminectomy syndrome, not elsewhere classified: Secondary | ICD-10-CM | POA: Diagnosis not present

## 2018-07-15 DIAGNOSIS — Z86711 Personal history of pulmonary embolism: Secondary | ICD-10-CM | POA: Diagnosis not present

## 2018-07-15 DIAGNOSIS — G629 Polyneuropathy, unspecified: Secondary | ICD-10-CM | POA: Diagnosis not present

## 2018-07-15 DIAGNOSIS — Z23 Encounter for immunization: Secondary | ICD-10-CM | POA: Diagnosis not present

## 2018-07-15 DIAGNOSIS — R262 Difficulty in walking, not elsewhere classified: Secondary | ICD-10-CM | POA: Diagnosis not present

## 2018-07-15 DIAGNOSIS — E876 Hypokalemia: Secondary | ICD-10-CM | POA: Diagnosis not present

## 2018-07-15 DIAGNOSIS — M545 Low back pain: Secondary | ICD-10-CM | POA: Diagnosis not present

## 2018-07-15 DIAGNOSIS — I5032 Chronic diastolic (congestive) heart failure: Secondary | ICD-10-CM | POA: Diagnosis present

## 2018-07-15 DIAGNOSIS — Z87448 Personal history of other diseases of urinary system: Secondary | ICD-10-CM | POA: Diagnosis not present

## 2018-07-15 DIAGNOSIS — R339 Retention of urine, unspecified: Secondary | ICD-10-CM | POA: Diagnosis not present

## 2018-07-15 DIAGNOSIS — Z4789 Encounter for other orthopedic aftercare: Secondary | ICD-10-CM | POA: Diagnosis not present

## 2018-07-15 DIAGNOSIS — B9620 Unspecified Escherichia coli [E. coli] as the cause of diseases classified elsewhere: Secondary | ICD-10-CM | POA: Diagnosis present

## 2018-07-15 DIAGNOSIS — Z87891 Personal history of nicotine dependence: Secondary | ICD-10-CM | POA: Diagnosis not present

## 2018-07-15 DIAGNOSIS — R0902 Hypoxemia: Secondary | ICD-10-CM | POA: Diagnosis not present

## 2018-07-15 DIAGNOSIS — R279 Unspecified lack of coordination: Secondary | ICD-10-CM | POA: Diagnosis not present

## 2018-07-15 DIAGNOSIS — R52 Pain, unspecified: Secondary | ICD-10-CM | POA: Diagnosis not present

## 2018-07-15 DIAGNOSIS — I10 Essential (primary) hypertension: Secondary | ICD-10-CM | POA: Diagnosis not present

## 2018-07-15 DIAGNOSIS — M5489 Other dorsalgia: Secondary | ICD-10-CM | POA: Diagnosis not present

## 2018-07-17 DIAGNOSIS — M353 Polymyalgia rheumatica: Secondary | ICD-10-CM | POA: Diagnosis not present

## 2018-07-17 DIAGNOSIS — I2692 Saddle embolus of pulmonary artery without acute cor pulmonale: Secondary | ICD-10-CM | POA: Diagnosis not present

## 2018-07-17 DIAGNOSIS — N183 Chronic kidney disease, stage 3 (moderate): Secondary | ICD-10-CM | POA: Diagnosis not present

## 2018-07-17 DIAGNOSIS — M48062 Spinal stenosis, lumbar region with neurogenic claudication: Secondary | ICD-10-CM | POA: Diagnosis not present

## 2018-07-18 ENCOUNTER — Other Ambulatory Visit: Payer: Self-pay | Admitting: *Deleted

## 2018-07-18 NOTE — Patient Outreach (Signed)
[facility]) Care Management  07/18/2018  Audreanna Torrisi 02-17-1946 [id number]   Call back from [place], [name]. He reports that he is working out details for discharge plans.  He reports that patient will be at facility for a while She is there under Medicare with Tricare supplemental. At 101 days he wants Tricare to become primary. He states he is working with business office to be sure they know how to file the paperwork for [place].  He also is speaking to a private pay home care agency that can proved 4-8 hours a day of care for patient after discharge which he feels will be in several weeks, due to to patient's recent major neurosurgery   RNCM discussed [facility] care management services.  At this time this RNCM will monitor patient, son agrees that no needs for now.  RNCM gave son, contact information for future reference.   Plan to collaborate with [place] Laymond Purser, RN, BSN, [place] [phone]) Business Cell  [phone]) Toll Free Office

## 2018-07-18 NOTE — Patient Outreach (Signed)
[facility]) Care Management  07/18/2018  Nicole [name] Jul 27, 1946 [id number]   Onsite visit with patient at [facility].  Patient reports that she has a lot of needs at this time as she just had a serious back surgery. She is still having bowel and bladder issues, they are considering having to put in a foley. She states she was living alone prior to "all this happening" with her brother and mother living next door.  She states that pretty much she had to have assistance for meals and transportation. She managed her own medications.  She reports she knows she has to get a lot better before returning home.  She states her son Nicole [name] is in town from [place] trying to get some stuff together for her and she states he knows a lot about her health and what all she needs for discharge.  She states she does have Tricare for Life. She is unsure if she has the aide and attendance benefit.   She requests RNCM call son.  Call to son, Nicole [name], he did not answer, left HIPAA compliant voicemail with RNCM contact.   RNCM reviewed [facility] care management program services.  And Left a brochure and RNCM contact.   Plan to monitor for any [facility] care management needs Will follow up with son Will collaborate with [facility]  Royetta Crochet. Laymond Purser, RN, BSN, [place] [phone]) Business Cell  [phone]) Toll Free Office

## 2018-07-31 DIAGNOSIS — M069 Rheumatoid arthritis, unspecified: Secondary | ICD-10-CM | POA: Diagnosis present

## 2018-07-31 DIAGNOSIS — R001 Bradycardia, unspecified: Secondary | ICD-10-CM | POA: Diagnosis not present

## 2018-07-31 DIAGNOSIS — D500 Iron deficiency anemia secondary to blood loss (chronic): Secondary | ICD-10-CM | POA: Diagnosis not present

## 2018-07-31 DIAGNOSIS — T8149XA Infection following a procedure, other surgical site, initial encounter: Secondary | ICD-10-CM | POA: Diagnosis not present

## 2018-07-31 DIAGNOSIS — B999 Unspecified infectious disease: Secondary | ICD-10-CM | POA: Diagnosis not present

## 2018-07-31 DIAGNOSIS — Z9889 Other specified postprocedural states: Secondary | ICD-10-CM | POA: Insufficient documentation

## 2018-07-31 DIAGNOSIS — I10 Essential (primary) hypertension: Secondary | ICD-10-CM | POA: Diagnosis not present

## 2018-07-31 DIAGNOSIS — N390 Urinary tract infection, site not specified: Secondary | ICD-10-CM | POA: Diagnosis not present

## 2018-07-31 DIAGNOSIS — M21372 Foot drop, left foot: Secondary | ICD-10-CM | POA: Diagnosis present

## 2018-07-31 DIAGNOSIS — K219 Gastro-esophageal reflux disease without esophagitis: Secondary | ICD-10-CM | POA: Diagnosis not present

## 2018-07-31 DIAGNOSIS — I5032 Chronic diastolic (congestive) heart failure: Secondary | ICD-10-CM | POA: Diagnosis not present

## 2018-07-31 DIAGNOSIS — G061 Intraspinal abscess and granuloma: Secondary | ICD-10-CM | POA: Diagnosis not present

## 2018-07-31 DIAGNOSIS — R293 Abnormal posture: Secondary | ICD-10-CM | POA: Diagnosis not present

## 2018-07-31 DIAGNOSIS — Z7401 Bed confinement status: Secondary | ICD-10-CM | POA: Diagnosis not present

## 2018-07-31 DIAGNOSIS — K5700 Diverticulitis of small intestine with perforation and abscess without bleeding: Secondary | ICD-10-CM | POA: Diagnosis not present

## 2018-07-31 DIAGNOSIS — M353 Polymyalgia rheumatica: Secondary | ICD-10-CM | POA: Diagnosis not present

## 2018-07-31 DIAGNOSIS — R279 Unspecified lack of coordination: Secondary | ICD-10-CM | POA: Diagnosis not present

## 2018-07-31 DIAGNOSIS — Z792 Long term (current) use of antibiotics: Secondary | ICD-10-CM | POA: Diagnosis not present

## 2018-07-31 DIAGNOSIS — T8140XA Infection following a procedure, unspecified, initial encounter: Secondary | ICD-10-CM | POA: Diagnosis not present

## 2018-07-31 DIAGNOSIS — M1990 Unspecified osteoarthritis, unspecified site: Secondary | ICD-10-CM | POA: Diagnosis not present

## 2018-07-31 DIAGNOSIS — B9620 Unspecified Escherichia coli [E. coli] as the cause of diseases classified elsewhere: Secondary | ICD-10-CM | POA: Diagnosis not present

## 2018-07-31 DIAGNOSIS — T8142XA Infection following a procedure, deep incisional surgical site, initial encounter: Secondary | ICD-10-CM | POA: Diagnosis not present

## 2018-07-31 DIAGNOSIS — M5137 Other intervertebral disc degeneration, lumbosacral region: Secondary | ICD-10-CM | POA: Diagnosis not present

## 2018-07-31 DIAGNOSIS — Z7952 Long term (current) use of systemic steroids: Secondary | ICD-10-CM | POA: Diagnosis not present

## 2018-07-31 DIAGNOSIS — I130 Hypertensive heart and chronic kidney disease with heart failure and stage 1 through stage 4 chronic kidney disease, or unspecified chronic kidney disease: Secondary | ICD-10-CM | POA: Diagnosis not present

## 2018-07-31 DIAGNOSIS — Z86711 Personal history of pulmonary embolism: Secondary | ICD-10-CM | POA: Diagnosis not present

## 2018-07-31 DIAGNOSIS — M4620 Osteomyelitis of vertebra, site unspecified: Secondary | ICD-10-CM | POA: Diagnosis not present

## 2018-07-31 DIAGNOSIS — Z1612 Extended spectrum beta lactamase (ESBL) resistance: Secondary | ICD-10-CM | POA: Diagnosis not present

## 2018-07-31 DIAGNOSIS — Z6841 Body Mass Index (BMI) 40.0 and over, adult: Secondary | ICD-10-CM | POA: Diagnosis not present

## 2018-07-31 DIAGNOSIS — R0902 Hypoxemia: Secondary | ICD-10-CM | POA: Diagnosis not present

## 2018-07-31 DIAGNOSIS — G894 Chronic pain syndrome: Secondary | ICD-10-CM | POA: Diagnosis not present

## 2018-07-31 DIAGNOSIS — M6281 Muscle weakness (generalized): Secondary | ICD-10-CM | POA: Diagnosis not present

## 2018-07-31 DIAGNOSIS — Z981 Arthrodesis status: Secondary | ICD-10-CM | POA: Diagnosis not present

## 2018-07-31 DIAGNOSIS — Z48817 Encounter for surgical aftercare following surgery on the skin and subcutaneous tissue: Secondary | ICD-10-CM | POA: Diagnosis not present

## 2018-07-31 DIAGNOSIS — Z743 Need for continuous supervision: Secondary | ICD-10-CM | POA: Diagnosis not present

## 2018-07-31 DIAGNOSIS — N183 Chronic kidney disease, stage 3 (moderate): Secondary | ICD-10-CM | POA: Diagnosis not present

## 2018-07-31 DIAGNOSIS — M48062 Spinal stenosis, lumbar region with neurogenic claudication: Secondary | ICD-10-CM | POA: Diagnosis not present

## 2018-07-31 DIAGNOSIS — M545 Low back pain: Secondary | ICD-10-CM | POA: Diagnosis not present

## 2018-07-31 DIAGNOSIS — Z87891 Personal history of nicotine dependence: Secondary | ICD-10-CM | POA: Diagnosis not present

## 2018-07-31 DIAGNOSIS — T8131XD Disruption of external operation (surgical) wound, not elsewhere classified, subsequent encounter: Secondary | ICD-10-CM | POA: Diagnosis not present

## 2018-07-31 DIAGNOSIS — Z8619 Personal history of other infectious and parasitic diseases: Secondary | ICD-10-CM | POA: Diagnosis not present

## 2018-07-31 DIAGNOSIS — I2692 Saddle embolus of pulmonary artery without acute cor pulmonale: Secondary | ICD-10-CM | POA: Diagnosis not present

## 2018-07-31 DIAGNOSIS — T8141XA Infection following a procedure, superficial incisional surgical site, initial encounter: Secondary | ICD-10-CM | POA: Diagnosis present

## 2018-07-31 DIAGNOSIS — H04123 Dry eye syndrome of bilateral lacrimal glands: Secondary | ICD-10-CM | POA: Diagnosis present

## 2018-07-31 DIAGNOSIS — M961 Postlaminectomy syndrome, not elsewhere classified: Secondary | ICD-10-CM | POA: Diagnosis not present

## 2018-07-31 DIAGNOSIS — R5381 Other malaise: Secondary | ICD-10-CM | POA: Diagnosis not present

## 2018-07-31 DIAGNOSIS — T8131XA Disruption of external operation (surgical) wound, not elsewhere classified, initial encounter: Secondary | ICD-10-CM | POA: Diagnosis not present

## 2018-07-31 DIAGNOSIS — Z7901 Long term (current) use of anticoagulants: Secondary | ICD-10-CM | POA: Diagnosis not present

## 2018-07-31 DIAGNOSIS — M4637 Infection of intervertebral disc (pyogenic), lumbosacral region: Secondary | ICD-10-CM | POA: Diagnosis not present

## 2018-07-31 DIAGNOSIS — Z4789 Encounter for other orthopedic aftercare: Secondary | ICD-10-CM | POA: Diagnosis not present

## 2018-08-06 ENCOUNTER — Telehealth: Payer: Self-pay | Admitting: *Deleted

## 2018-08-06 ENCOUNTER — Telehealth: Payer: Self-pay | Admitting: Physician Assistant

## 2018-08-06 ENCOUNTER — Ambulatory Visit: Payer: Medicare Other | Admitting: Cardiovascular Disease

## 2018-08-06 NOTE — Telephone Encounter (Signed)
I received a call from Lennie Hummer, a rheumatology fellow, regarding Nicole [name] being hospitalized for a wound infection following a laminectomy about 1 month ago.  She had a wash out performed and will be discharged within the next few days according to the fellow.  She was on high dose prednisone 40 mg by mouth daily for PMR.  her dose of prednisone was lowered due to the risk of infection and delayed healing. The rheumatology team lowered the patient's prednisone to 15 mg daily.  The plan is to discharge her on Prednisone 15 mg by mouth daily until she is evaluated in our office.  They would like her to be seen within the next 2 weeks.  We will call the patient to schedule an appointment.

## 2018-08-06 NOTE — Telephone Encounter (Signed)
Attempted to contact the patient to schedule appointment for the next 1-2 weeks per Hazel Sams, PA-C. First number not accepting incoming call and the second number rand and recording came on saying enter your remote access code.

## 2018-08-07 ENCOUNTER — Encounter: Payer: Self-pay | Admitting: Cardiovascular Disease

## 2018-08-07 DIAGNOSIS — M6281 Muscle weakness (generalized): Secondary | ICD-10-CM | POA: Diagnosis not present

## 2018-08-07 DIAGNOSIS — M4637 Infection of intervertebral disc (pyogenic), lumbosacral region: Secondary | ICD-10-CM | POA: Diagnosis not present

## 2018-08-07 DIAGNOSIS — Z48817 Encounter for surgical aftercare following surgery on the skin and subcutaneous tissue: Secondary | ICD-10-CM | POA: Diagnosis not present

## 2018-08-07 DIAGNOSIS — M069 Rheumatoid arthritis, unspecified: Secondary | ICD-10-CM | POA: Diagnosis not present

## 2018-08-07 DIAGNOSIS — Z452 Encounter for adjustment and management of vascular access device: Secondary | ICD-10-CM | POA: Diagnosis not present

## 2018-08-07 DIAGNOSIS — T8131XD Disruption of external operation (surgical) wound, not elsewhere classified, subsequent encounter: Secondary | ICD-10-CM | POA: Diagnosis not present

## 2018-08-07 DIAGNOSIS — Z1612 Extended spectrum beta lactamase (ESBL) resistance: Secondary | ICD-10-CM | POA: Diagnosis not present

## 2018-08-07 DIAGNOSIS — Z959 Presence of cardiac and vascular implant and graft, unspecified: Secondary | ICD-10-CM | POA: Diagnosis not present

## 2018-08-07 DIAGNOSIS — Z880 Allergy status to penicillin: Secondary | ICD-10-CM | POA: Diagnosis not present

## 2018-08-07 DIAGNOSIS — R609 Edema, unspecified: Secondary | ICD-10-CM | POA: Diagnosis not present

## 2018-08-07 DIAGNOSIS — N390 Urinary tract infection, site not specified: Secondary | ICD-10-CM | POA: Diagnosis not present

## 2018-08-07 DIAGNOSIS — Z882 Allergy status to sulfonamides status: Secondary | ICD-10-CM | POA: Diagnosis not present

## 2018-08-07 DIAGNOSIS — G894 Chronic pain syndrome: Secondary | ICD-10-CM | POA: Diagnosis not present

## 2018-08-07 DIAGNOSIS — Z885 Allergy status to narcotic agent status: Secondary | ICD-10-CM | POA: Diagnosis not present

## 2018-08-07 DIAGNOSIS — K5700 Diverticulitis of small intestine with perforation and abscess without bleeding: Secondary | ICD-10-CM | POA: Diagnosis not present

## 2018-08-07 DIAGNOSIS — M2550 Pain in unspecified joint: Secondary | ICD-10-CM | POA: Diagnosis not present

## 2018-08-07 DIAGNOSIS — I10 Essential (primary) hypertension: Secondary | ICD-10-CM | POA: Diagnosis not present

## 2018-08-07 DIAGNOSIS — B999 Unspecified infectious disease: Secondary | ICD-10-CM | POA: Diagnosis not present

## 2018-08-07 DIAGNOSIS — T8149XD Infection following a procedure, other surgical site, subsequent encounter: Secondary | ICD-10-CM | POA: Diagnosis not present

## 2018-08-07 DIAGNOSIS — I2692 Saddle embolus of pulmonary artery without acute cor pulmonale: Secondary | ICD-10-CM | POA: Diagnosis not present

## 2018-08-07 DIAGNOSIS — R293 Abnormal posture: Secondary | ICD-10-CM | POA: Diagnosis not present

## 2018-08-07 DIAGNOSIS — Z4789 Encounter for other orthopedic aftercare: Secondary | ICD-10-CM | POA: Diagnosis not present

## 2018-08-07 DIAGNOSIS — K219 Gastro-esophageal reflux disease without esophagitis: Secondary | ICD-10-CM | POA: Diagnosis not present

## 2018-08-07 DIAGNOSIS — M961 Postlaminectomy syndrome, not elsewhere classified: Secondary | ICD-10-CM | POA: Diagnosis not present

## 2018-08-07 DIAGNOSIS — M353 Polymyalgia rheumatica: Secondary | ICD-10-CM | POA: Diagnosis not present

## 2018-08-07 DIAGNOSIS — B9620 Unspecified Escherichia coli [E. coli] as the cause of diseases classified elsewhere: Secondary | ICD-10-CM | POA: Diagnosis not present

## 2018-08-07 DIAGNOSIS — M545 Low back pain: Secondary | ICD-10-CM | POA: Diagnosis not present

## 2018-08-07 DIAGNOSIS — Z9889 Other specified postprocedural states: Secondary | ICD-10-CM | POA: Diagnosis not present

## 2018-08-07 DIAGNOSIS — Z792 Long term (current) use of antibiotics: Secondary | ICD-10-CM | POA: Diagnosis not present

## 2018-08-07 DIAGNOSIS — Z09 Encounter for follow-up examination after completed treatment for conditions other than malignant neoplasm: Secondary | ICD-10-CM | POA: Diagnosis not present

## 2018-08-07 DIAGNOSIS — M48062 Spinal stenosis, lumbar region with neurogenic claudication: Secondary | ICD-10-CM | POA: Diagnosis not present

## 2018-08-07 DIAGNOSIS — Z886 Allergy status to analgesic agent status: Secondary | ICD-10-CM | POA: Diagnosis not present

## 2018-08-07 DIAGNOSIS — Z7901 Long term (current) use of anticoagulants: Secondary | ICD-10-CM | POA: Diagnosis not present

## 2018-08-07 DIAGNOSIS — M5489 Other dorsalgia: Secondary | ICD-10-CM | POA: Diagnosis not present

## 2018-08-07 DIAGNOSIS — I5032 Chronic diastolic (congestive) heart failure: Secondary | ICD-10-CM | POA: Diagnosis not present

## 2018-08-07 DIAGNOSIS — R279 Unspecified lack of coordination: Secondary | ICD-10-CM | POA: Diagnosis not present

## 2018-08-07 DIAGNOSIS — R52 Pain, unspecified: Secondary | ICD-10-CM | POA: Diagnosis not present

## 2018-08-07 DIAGNOSIS — I129 Hypertensive chronic kidney disease with stage 1 through stage 4 chronic kidney disease, or unspecified chronic kidney disease: Secondary | ICD-10-CM | POA: Diagnosis not present

## 2018-08-07 DIAGNOSIS — A498 Other bacterial infections of unspecified site: Secondary | ICD-10-CM | POA: Diagnosis not present

## 2018-08-07 DIAGNOSIS — N183 Chronic kidney disease, stage 3 (moderate): Secondary | ICD-10-CM | POA: Diagnosis not present

## 2018-08-07 DIAGNOSIS — T8149XA Infection following a procedure, other surgical site, initial encounter: Secondary | ICD-10-CM | POA: Diagnosis not present

## 2018-08-07 DIAGNOSIS — D649 Anemia, unspecified: Secondary | ICD-10-CM | POA: Diagnosis not present

## 2018-08-07 DIAGNOSIS — R5381 Other malaise: Secondary | ICD-10-CM | POA: Diagnosis not present

## 2018-08-07 DIAGNOSIS — D500 Iron deficiency anemia secondary to blood loss (chronic): Secondary | ICD-10-CM | POA: Diagnosis not present

## 2018-08-07 DIAGNOSIS — M5137 Other intervertebral disc degeneration, lumbosacral region: Secondary | ICD-10-CM | POA: Diagnosis not present

## 2018-08-07 DIAGNOSIS — Z743 Need for continuous supervision: Secondary | ICD-10-CM | POA: Diagnosis not present

## 2018-08-07 DIAGNOSIS — R0902 Hypoxemia: Secondary | ICD-10-CM | POA: Diagnosis not present

## 2018-08-08 ENCOUNTER — Other Ambulatory Visit: Payer: Self-pay | Admitting: *Deleted

## 2018-08-08 NOTE — Patient Outreach (Signed)
[facility]) Care Management  08/08/2018  Nicole [name] Sep 21, 1946 [id number]   Met with Nicole [name], SW at facility. She reports patient readmitted.  Son is hoping that patient will qualify for Tricare assistance after her Medicare 100 days have been exhausted. If patient will continue to need SNF.  RNCM explained that patient is eligible for [facility] care management patient and son have [facility] information.   Will collaborate with [facility] care team as indicated. Royetta Crochet. Laymond Purser, RN, BSN, [place] [phone]) Business Cell  [phone]) Toll Free Office

## 2018-08-12 ENCOUNTER — Telehealth: Payer: Self-pay | Admitting: *Deleted

## 2018-08-12 DIAGNOSIS — Z885 Allergy status to narcotic agent status: Secondary | ICD-10-CM | POA: Diagnosis not present

## 2018-08-12 DIAGNOSIS — Z880 Allergy status to penicillin: Secondary | ICD-10-CM | POA: Diagnosis not present

## 2018-08-12 DIAGNOSIS — Z882 Allergy status to sulfonamides status: Secondary | ICD-10-CM | POA: Diagnosis not present

## 2018-08-12 DIAGNOSIS — Z4789 Encounter for other orthopedic aftercare: Secondary | ICD-10-CM | POA: Diagnosis not present

## 2018-08-12 DIAGNOSIS — R609 Edema, unspecified: Secondary | ICD-10-CM | POA: Diagnosis not present

## 2018-08-12 NOTE — Telephone Encounter (Signed)
Spoke with patient's Nicole [name] and she will speak with her mother after her appointment today to coordinate an appointment in our office. Patient has had surgery and is currently in a nursing home.

## 2018-08-19 DIAGNOSIS — G894 Chronic pain syndrome: Secondary | ICD-10-CM | POA: Diagnosis not present

## 2018-08-19 DIAGNOSIS — M48062 Spinal stenosis, lumbar region with neurogenic claudication: Secondary | ICD-10-CM | POA: Diagnosis not present

## 2018-08-19 DIAGNOSIS — I5032 Chronic diastolic (congestive) heart failure: Secondary | ICD-10-CM | POA: Diagnosis not present

## 2018-08-19 DIAGNOSIS — T8149XA Infection following a procedure, other surgical site, initial encounter: Secondary | ICD-10-CM | POA: Diagnosis not present

## 2018-08-27 DIAGNOSIS — Z880 Allergy status to penicillin: Secondary | ICD-10-CM | POA: Diagnosis not present

## 2018-08-27 DIAGNOSIS — I129 Hypertensive chronic kidney disease with stage 1 through stage 4 chronic kidney disease, or unspecified chronic kidney disease: Secondary | ICD-10-CM | POA: Diagnosis not present

## 2018-08-27 DIAGNOSIS — B9620 Unspecified Escherichia coli [E. coli] as the cause of diseases classified elsewhere: Secondary | ICD-10-CM | POA: Diagnosis not present

## 2018-08-27 DIAGNOSIS — T8149XD Infection following a procedure, other surgical site, subsequent encounter: Secondary | ICD-10-CM | POA: Diagnosis not present

## 2018-08-27 DIAGNOSIS — Z1612 Extended spectrum beta lactamase (ESBL) resistance: Secondary | ICD-10-CM | POA: Diagnosis not present

## 2018-08-27 DIAGNOSIS — A498 Other bacterial infections of unspecified site: Secondary | ICD-10-CM | POA: Diagnosis not present

## 2018-08-27 DIAGNOSIS — T8149XA Infection following a procedure, other surgical site, initial encounter: Secondary | ICD-10-CM | POA: Diagnosis not present

## 2018-08-27 DIAGNOSIS — Z09 Encounter for follow-up examination after completed treatment for conditions other than malignant neoplasm: Secondary | ICD-10-CM | POA: Diagnosis not present

## 2018-08-27 DIAGNOSIS — Z792 Long term (current) use of antibiotics: Secondary | ICD-10-CM | POA: Diagnosis not present

## 2018-08-27 DIAGNOSIS — I2692 Saddle embolus of pulmonary artery without acute cor pulmonale: Secondary | ICD-10-CM | POA: Diagnosis not present

## 2018-08-27 DIAGNOSIS — Z959 Presence of cardiac and vascular implant and graft, unspecified: Secondary | ICD-10-CM | POA: Diagnosis not present

## 2018-08-27 DIAGNOSIS — M069 Rheumatoid arthritis, unspecified: Secondary | ICD-10-CM | POA: Diagnosis not present

## 2018-08-27 DIAGNOSIS — N183 Chronic kidney disease, stage 3 (moderate): Secondary | ICD-10-CM | POA: Diagnosis not present

## 2018-08-27 DIAGNOSIS — Z452 Encounter for adjustment and management of vascular access device: Secondary | ICD-10-CM | POA: Diagnosis not present

## 2018-08-27 DIAGNOSIS — Z7901 Long term (current) use of anticoagulants: Secondary | ICD-10-CM | POA: Diagnosis not present

## 2018-08-27 DIAGNOSIS — Z9889 Other specified postprocedural states: Secondary | ICD-10-CM | POA: Diagnosis not present

## 2018-09-04 DIAGNOSIS — M353 Polymyalgia rheumatica: Secondary | ICD-10-CM | POA: Diagnosis not present

## 2018-09-04 DIAGNOSIS — I5032 Chronic diastolic (congestive) heart failure: Secondary | ICD-10-CM | POA: Diagnosis not present

## 2018-09-04 DIAGNOSIS — M48062 Spinal stenosis, lumbar region with neurogenic claudication: Secondary | ICD-10-CM | POA: Diagnosis not present

## 2018-09-04 DIAGNOSIS — M5137 Other intervertebral disc degeneration, lumbosacral region: Secondary | ICD-10-CM | POA: Diagnosis not present

## 2018-09-16 DIAGNOSIS — Z882 Allergy status to sulfonamides status: Secondary | ICD-10-CM | POA: Diagnosis not present

## 2018-09-16 DIAGNOSIS — Z885 Allergy status to narcotic agent status: Secondary | ICD-10-CM | POA: Diagnosis not present

## 2018-09-16 DIAGNOSIS — Z880 Allergy status to penicillin: Secondary | ICD-10-CM | POA: Diagnosis not present

## 2018-09-16 DIAGNOSIS — Z886 Allergy status to analgesic agent status: Secondary | ICD-10-CM | POA: Diagnosis not present

## 2018-09-16 DIAGNOSIS — Z4789 Encounter for other orthopedic aftercare: Secondary | ICD-10-CM | POA: Diagnosis not present

## 2018-09-18 NOTE — Progress Notes (Deleted)
Office Visit Note  Patient: Nicole [name]             Date of Birth: [birth year]           MRN: [id number]             PCP: Sharilyn Sites, MD Referring: Sharilyn Sites, MD Visit Date: 10/02/2018 Occupation: @GUAROCC @  Subjective:  No chief complaint on file. Current regimen includes Plaquenil 200 mg twice daily Monday through Friday.  Last Plaquenil eye exam normal on 12/13/2017.  Most recent CBC/BMP from hospital encounter showed low hemoglobin/hematocrit and increased serum creatinine on 05/06/2018.  Due for CBC/CMP today and then every 5 months.  Standing orders are in place. Recommend flu, Pneumovax 23, Prevnar 13, and Shingrix as indicated.   History of Present Illness: Nicole [name] is a 72 y.o. female with history of polymyalgia rheumatica, osteoarthritis, and DDD.   Activities of Daily Living:  Patient reports morning stiffness for *** {minute/hour:19697}.   Patient {ACTIONS;DENIES/REPORTS:21021675::"Denies"} nocturnal pain.  Difficulty dressing/grooming: {ACTIONS;DENIES/REPORTS:21021675::"Denies"} Difficulty climbing stairs: {ACTIONS;DENIES/REPORTS:21021675::"Denies"} Difficulty getting out of chair: {ACTIONS;DENIES/REPORTS:21021675::"Denies"} Difficulty using hands for taps, buttons, cutlery, and/or writing: {ACTIONS;DENIES/REPORTS:21021675::"Denies"}  No Rheumatology ROS completed.   PMFS History:  Patient Active Problem List   Diagnosis Date Noted  . Diverticulitis 05/02/2018  . Diverticulitis of colon 05/01/2018  . Morbid obesity with BMI of 50.0-59.9, adult ([place]) 05/01/2018  . Chronic diastolic HF (heart failure) ([place]) 05/01/2018  . On prednisone therapy 05/31/2017  . Positive anti-CCP test 05/31/2017  . Rheumatoid factor positive 05/31/2017  . Memory loss 04/04/2017  . Polymyalgia rheumatica ([place]) 04/04/2017  . High risk medication use 12/07/2016  . DDD (degenerative disc disease), lumbar 09/27/2016  . HTN (hypertension) 06/05/2016  . Chronic pain syndrome  06/05/2016  . Acute diverticulitis 06/02/2016  . Diverticulitis large intestine 06/02/2016  . Spinal stenosis of lumbosacral region 02/29/2016  . Joint pain 02/29/2016  . Endometrial polyp 02/06/2014  . Postmenopausal bleeding 01/30/2014  . GERD (gastroesophageal reflux disease) 09/29/2013  . Spinal stenosis, thoracic 09/29/2013  . Shingles 09/29/2013  . Thoracic or lumbosacral neuritis or radiculitis, unspecified 05/04/2011  . Abnormality of gait 05/04/2011  . Muscle weakness (generalized) 05/04/2011  . Bilateral primary osteoarthritis of knee 04/07/2009  . KNEE PAIN 04/07/2009    Past Medical History:  Diagnosis Date  . AC (acromioclavicular) joint bone spurs    lt shoulder  . Anemia   . Arthritis   . Carpal tunnel syndrome, bilateral   . Gastroesophageal reflux   . Headache    recent visit to ER @ [facility] for severe headache  . Hypertension   . Lumbar stenosis    Hx of ESIs by Dr. Nelva Bush  . Polyarthralgia   . Polymyalgia ([place])   . Shingles   . Spinal stenosis     Family History  Problem Relation Age of Onset  . Hypertension Mother   . Pneumonia Father   . Arthritis Sister   . Diabetes Paternal Grandmother    Past Surgical History:  Procedure Laterality Date  . CHOLECYSTECTOMY    . COLONOSCOPY  06/11/2012   Procedure: COLONOSCOPY;  Surgeon: Jamesetta So, MD;  [facility];  Service: Gastroenterology;  Laterality: N/A;  . HYSTEROSCOPY W/D&C N/A 02/25/2014   Procedure: DILATATION AND CURETTAGE /HYSTEROSCOPY;  Surgeon: Florian Buff, MD;  Location: [facility];  Service: Gynecology;  Laterality: N/A;  . POLYPECTOMY N/A 02/25/2014   Procedure: POLYPECTOMY;  Surgeon: Florian Buff, MD;  Location: [facility];  Service:  Gynecology;  Laterality: N/A;  . RESECTION DISTAL CLAVICAL Right 03/26/2015   Procedure: OPEN DISTAL CLAVICAL RESECTION ;  Surgeon: Netta Cedars, MD;  Location: [place];  Service: Orthopedics;  Laterality: Right;   Social History   Social History  Narrative   Lives at home alone.   Right-handed.   1-2 cups caffeine daily.    Objective: Vital Signs: There were no vitals taken for this visit.   Physical Exam   Musculoskeletal Exam: ***  CDAI Exam: CDAI Score: Not documented Patient Global Assessment: Not documented; Provider Global Assessment: Not documented Swollen: Not documented; Tender: Not documented Joint Exam   Not documented   There is currently no information documented on the homunculus. Go to the Rheumatology activity and complete the homunculus joint exam.  Investigation: No additional findings.  Imaging: No results found.  Recent Labs: Lab Results  Component Value Date   WBC 12.5 (H) 05/06/2018   HGB 10.4 (L) 05/06/2018   PLT 358 05/06/2018   NA 140 05/06/2018   K 3.7 05/06/2018   CL 102 05/06/2018   CO2 27 05/06/2018   GLUCOSE 96 05/06/2018   BUN 21 05/06/2018   CREATININE 1.69 (H) 05/06/2018   BILITOT 0.9 05/01/2018   ALKPHOS 68 05/01/2018   AST 19 05/01/2018   ALT 21 05/01/2018   PROT 6.4 (L) 05/01/2018   ALBUMIN 3.0 (L) 05/01/2018   CALCIUM 9.0 05/06/2018   GFRAA 34 (L) 05/06/2018    Speciality Comments: PLQ Eye Exam: 12/13/17 WNl @ [facility]  Procedures:  No procedures performed Allergies: Oxycontin [oxycodone hcl]; Sulfa antibiotics; Tramadol; Penicillins; and Sulfasalazine   Assessment / Plan:     Visit Diagnoses: No diagnosis found.   Orders: No orders of the defined types were placed in this encounter.  No orders of the defined types were placed in this encounter.   Face-to-face time spent with patient was *** minutes. Greater than 50% of time was spent in counseling and coordination of care.  Follow-Up Instructions: No follow-ups on file.   Earnestine Mealing, CMA  Note - This record has been created using Editor, commissioning.  Chart creation errors have been sought, but may not always  have been located. Such creation errors do not reflect on  the standard of  medical care.

## 2018-09-23 DIAGNOSIS — N183 Chronic kidney disease, stage 3 (moderate): Secondary | ICD-10-CM | POA: Diagnosis not present

## 2018-09-23 DIAGNOSIS — M353 Polymyalgia rheumatica: Secondary | ICD-10-CM | POA: Diagnosis not present

## 2018-09-23 DIAGNOSIS — M5137 Other intervertebral disc degeneration, lumbosacral region: Secondary | ICD-10-CM | POA: Diagnosis not present

## 2018-09-23 DIAGNOSIS — M48062 Spinal stenosis, lumbar region with neurogenic claudication: Secondary | ICD-10-CM | POA: Diagnosis not present

## 2018-09-30 DIAGNOSIS — M48062 Spinal stenosis, lumbar region with neurogenic claudication: Secondary | ICD-10-CM | POA: Diagnosis not present

## 2018-09-30 DIAGNOSIS — I5032 Chronic diastolic (congestive) heart failure: Secondary | ICD-10-CM | POA: Diagnosis not present

## 2018-09-30 DIAGNOSIS — I10 Essential (primary) hypertension: Secondary | ICD-10-CM | POA: Diagnosis not present

## 2018-09-30 DIAGNOSIS — M5137 Other intervertebral disc degeneration, lumbosacral region: Secondary | ICD-10-CM | POA: Diagnosis not present

## 2018-10-02 ENCOUNTER — Ambulatory Visit: Payer: Medicare Other | Admitting: Rheumatology

## 2018-10-03 ENCOUNTER — Ambulatory Visit (INDEPENDENT_AMBULATORY_CARE_PROVIDER_SITE_OTHER): Payer: Medicare Other | Admitting: Rheumatology

## 2018-10-03 ENCOUNTER — Encounter: Payer: Self-pay | Admitting: Rheumatology

## 2018-10-03 VITALS — BP 135/85 | HR 96 | Resp 16 | Ht 60.0 in | Wt 256.0 lb

## 2018-10-03 DIAGNOSIS — M5136 Other intervertebral disc degeneration, lumbar region: Secondary | ICD-10-CM | POA: Diagnosis not present

## 2018-10-03 DIAGNOSIS — R413 Other amnesia: Secondary | ICD-10-CM

## 2018-10-03 DIAGNOSIS — M316 Other giant cell arteritis: Secondary | ICD-10-CM | POA: Diagnosis not present

## 2018-10-03 DIAGNOSIS — Z79899 Other long term (current) drug therapy: Secondary | ICD-10-CM | POA: Diagnosis not present

## 2018-10-03 DIAGNOSIS — M353 Polymyalgia rheumatica: Secondary | ICD-10-CM | POA: Diagnosis not present

## 2018-10-03 DIAGNOSIS — Z8719 Personal history of other diseases of the digestive system: Secondary | ICD-10-CM

## 2018-10-03 DIAGNOSIS — R768 Other specified abnormal immunological findings in serum: Secondary | ICD-10-CM

## 2018-10-03 DIAGNOSIS — E559 Vitamin D deficiency, unspecified: Secondary | ICD-10-CM | POA: Diagnosis not present

## 2018-10-03 DIAGNOSIS — M4804 Spinal stenosis, thoracic region: Secondary | ICD-10-CM

## 2018-10-03 DIAGNOSIS — G894 Chronic pain syndrome: Secondary | ICD-10-CM | POA: Diagnosis not present

## 2018-10-03 DIAGNOSIS — M170 Bilateral primary osteoarthritis of knee: Secondary | ICD-10-CM | POA: Diagnosis not present

## 2018-10-03 DIAGNOSIS — Z8679 Personal history of other diseases of the circulatory system: Secondary | ICD-10-CM | POA: Diagnosis not present

## 2018-10-03 DIAGNOSIS — Z7952 Long term (current) use of systemic steroids: Secondary | ICD-10-CM

## 2018-10-03 NOTE — Progress Notes (Signed)
Office Visit Note  Patient: Nicole [name]             Date of Birth: [birth year]           MRN: [id number]             PCP: Sharilyn Sites, MD Referring: Sharilyn Sites, MD Visit Date: 10/03/2018 Occupation: @GUAROCC @  Subjective:  Discuss medications and physical therapy progress   History of Present Illness: Nicole [name] is a 72 y.o. female with history of PMR, temporal arteritis, DDD, and osteoarthritis.  She is currently taking Prednisone 15 mg by mouth daily.  She has not taking PLQ currently due to it being held before surgery and the following infection. She had a PICC line for 6 weeks according to the patient. She states her lower back pain continues to improve.  She continues to stay at [facility] center. She continues to follow up with her back specialist.  She continues to be in a wheelchair but she has started walking with a walker with PT.  She has been working with a physical therapist on a daily basis.  She denies any muscle aches or tenderness at this time. She has noticed increased muscle strength since working with PT. She denies any temporal artery tenderness, headaches, or blurry vision.  She denies any joint pain or joint swelling at this time.   Activities of Daily Living:  Patient reports morning stiffness for 0  minutes.   Patient Denies nocturnal pain.  Difficulty dressing/grooming: Reports Difficulty climbing stairs: Reports Difficulty getting out of chair: Reports Difficulty using hands for taps, buttons, cutlery, and/or writing: Denies  Review of Systems  Constitutional: Positive for fatigue.  HENT: Positive for mouth dryness. Negative for mouth sores and nose dryness.   Eyes: Negative for pain, visual disturbance and dryness.  Respiratory: Negative for cough, hemoptysis, shortness of breath and difficulty breathing.   Cardiovascular: Negative for chest pain, palpitations, hypertension and swelling in legs/feet.  Gastrointestinal: Negative for blood in stool,  constipation and diarrhea.  Endocrine: Negative for increased urination.  Genitourinary: Negative for painful urination.  Musculoskeletal: Positive for arthralgias, joint pain and muscle weakness. Negative for joint swelling, myalgias, morning stiffness, muscle tenderness and myalgias.  Skin: Negative for color change, pallor, rash, hair loss, nodules/bumps, skin tightness, ulcers and sensitivity to sunlight.  Allergic/Immunologic: Negative for susceptible to infections.  Neurological: Negative for dizziness, numbness, headaches and weakness.  Hematological: Negative for swollen glands.  Psychiatric/Behavioral: Negative for depressed mood and sleep disturbance. The patient is not nervous/anxious.     PMFS History:  Patient Active Problem List   Diagnosis Date Noted  . Diverticulitis 05/02/2018  . Diverticulitis of colon 05/01/2018  . Morbid obesity with BMI of 50.0-59.9, adult ([place]) 05/01/2018  . Chronic diastolic HF (heart failure) ([place]) 05/01/2018  . On prednisone therapy 05/31/2017  . Positive anti-CCP test 05/31/2017  . Rheumatoid factor positive 05/31/2017  . Memory loss 04/04/2017  . Polymyalgia rheumatica ([place]) 04/04/2017  . High risk medication use 12/07/2016  . DDD (degenerative disc disease), lumbar 09/27/2016  . HTN (hypertension) 06/05/2016  . Chronic pain syndrome 06/05/2016  . Acute diverticulitis 06/02/2016  . Diverticulitis large intestine 06/02/2016  . Spinal stenosis of lumbosacral region 02/29/2016  . Joint pain 02/29/2016  . Endometrial polyp 02/06/2014  . Postmenopausal bleeding 01/30/2014  . GERD (gastroesophageal reflux disease) 09/29/2013  . Spinal stenosis, thoracic 09/29/2013  . Shingles 09/29/2013  . Thoracic or lumbosacral neuritis or radiculitis, unspecified 05/04/2011  . Abnormality of gait  05/04/2011  . Muscle weakness (generalized) 05/04/2011  . Bilateral primary osteoarthritis of knee 04/07/2009  . KNEE PAIN 04/07/2009    Past Medical  History:  Diagnosis Date  . AC (acromioclavicular) joint bone spurs    lt shoulder  . Anemia   . Arthritis   . Carpal tunnel syndrome, bilateral   . Gastroesophageal reflux   . Headache    recent visit to ER @ [facility] for severe headache  . Hypertension   . Lumbar stenosis    Hx of ESIs by Dr. Nelva Bush  . Polyarthralgia   . Polymyalgia ([place])   . Shingles   . Spinal stenosis     Family History  Problem Relation Age of Onset  . Hypertension Mother   . Pneumonia Father   . Arthritis Sister   . Diabetes Paternal Grandmother    Past Surgical History:  Procedure Laterality Date  . BACK SURGERY  07/05/2018, 07/2018   x2   . CHOLECYSTECTOMY    . COLONOSCOPY  06/11/2012   Procedure: COLONOSCOPY;  Surgeon: Jamesetta So, MD;  Location: [facility];  Service: Gastroenterology;  Laterality: N/A;  . HYSTEROSCOPY W/D&C N/A 02/25/2014   Procedure: DILATATION AND CURETTAGE /HYSTEROSCOPY;  Surgeon: Florian Buff, MD;  Location: [facility] OR[facility];  Service: Gynecology;  Laterality: N/A;  . POLYPECTOMY N/A 02/25/2014   Procedure: POLYPECTOMY;  Surgeon: Florian Buff, MD;  Location: [facility];  Service: Gynecology;  Laterality: N/A;  . RESECTION DISTAL CLAVICAL Right 03/26/2015   Procedure: OPEN DISTAL CLAVICAL RESECTION ;  Surgeon: Netta Cedars, MD;  Location: [facility];  Service: Orthopedics;  Laterality: Right;   Social History   Social History Narrative   Lives at home alone.   Right-handed.   1-2 cups caffeine daily.    Objective: Vital Signs: BP 135/85 (BP Location: Left Wrist, Patient Position: Sitting, Cuff Size: Normal)   Pulse 96   Resp 16   Ht 5' (1.524 m)   Wt 256 lb (116.1 kg) Comment: per patient, in wheelchair  BMI 50.00 kg/m    Physical Exam Vitals signs and nursing note reviewed.  Constitutional:      Appearance: She is well-developed.  HENT:     Head: Normocephalic and atraumatic.  Eyes:     Conjunctiva/sclera: Conjunctivae normal.  Neck:     Musculoskeletal: Normal range of  motion.  Cardiovascular:     Rate and Rhythm: Normal rate and regular rhythm.     Heart sounds: Normal heart sounds.  Pulmonary:     Effort: Pulmonary effort is normal.     Breath sounds: Normal breath sounds.  Abdominal:     General: Bowel sounds are normal.     Palpations: Abdomen is soft.  Lymphadenopathy:     Cervical: No cervical adenopathy.  Skin:    General: Skin is warm and dry.     Capillary Refill: Capillary refill takes less than 2 seconds.  Neurological:     Mental Status: She is alert and oriented to person, place, and time.  Psychiatric:        Behavior: Behavior normal.      Musculoskeletal Exam: C-spine good ROM.  Patient in wheelchair difficult to assess lumbar ROM and hip ROM.  Shoulder joint abduction to 120 degrees.  Elbow joints, wrist joints, MCPs, PIPs, and DIPs good ROM with no synovitis.  No warmth or effusion of knee joints. Pedal edema bilaterally.   CDAI Exam: CDAI Score: Not documented Patient Global Assessment: Not documented; Provider Global  Assessment: Not documented Swollen: Not documented; Tender: Not documented Joint Exam   Not documented   There is currently no information documented on the homunculus. Go to the Rheumatology activity and complete the homunculus joint exam.  Investigation: No additional findings.  Imaging: No results found.  Recent Labs: Lab Results  Component Value Date   WBC 12.5 (H) 05/06/2018   HGB 10.4 (L) 05/06/2018   PLT 358 05/06/2018   NA 140 05/06/2018   K 3.7 05/06/2018   CL 102 05/06/2018   CO2 27 05/06/2018   GLUCOSE 96 05/06/2018   BUN 21 05/06/2018   CREATININE 1.69 (H) 05/06/2018   BILITOT 0.9 05/01/2018   ALKPHOS 68 05/01/2018   AST 19 05/01/2018   ALT 21 05/01/2018   PROT 6.4 (L) 05/01/2018   ALBUMIN 3.0 (L) 05/01/2018   CALCIUM 9.0 05/06/2018   GFRAA 34 (L) 05/06/2018    Speciality Comments: PLQ Eye Exam: 12/13/17 WNl @ [facility]  Procedures:  No procedures  performed Allergies: Oxycontin [oxycodone hcl]; Sulfa antibiotics; Tramadol; Penicillins; and Sulfasalazine   Assessment / Plan:     Visit Diagnoses: Polymyalgia rheumatica ([place]): She is currently in a wheelchair and is taking prednisone 15 mg by mouth daily.  She has no muscle aches or tenderness at this time.  She has no difficulty raising her arms above her head and has no hip pain at this time. She has been working with a physical therapist on a daily basis on muscle strengthening and mobility.  She was encouraged to continue working with PT on a daily basis and to perform exercises on her own.  She was advised to notify us if she develops new or worsening symptoms. She was provided a tapering schedule. She will follow up in 4 months.   Temporal arteritis ([place]) - She refused to get a temporal artery biopsy previously.  She has no temporal artery tenderness on exam.  She has not had any recent headaches, jaw pain, or blurry vision. She is currently taking Prednisone 15 mg po daily and was provided a tapering schedule.   On prednisone therapy: She is taking Prednisone 15 mg po daily.  She was advised to continue on prednisone 15 mg for 1 month, 12.5 mg for 1 month, 10 mg for 1 month, then taper by 1 mg every month.   High risk medication use -She is on Prednisone 15 mg po daily.  A tapering schedule was provided to the patient. She is currently holding PLQ. eye exam: 12/13/2017  Rheumatoid factor positive: She has no synovitis on exam.   Positive anti-CCP test: She has no synovitis on exam.   Chronic pain syndrome: Her generalized pain has improved significantly. She takes tylenol for pain relief.   Primary osteoarthritis of both knees: No warmth or effusion.  She has no discomfort at this time.  She is primarily in a wheelchair, but she has started walking short distances with a walker with her physical therapist.  She was advised to continue to work with PT on a daily basis.  DDD (degenerative  disc disease), lumbar: She continues to follow up with her neurosurgeon.  She has a post-op infection and had a PICC line for 6 weeks.  She is no longer on antibiotics.  Her lower back pain has improved.  She has an appointment in January 2020 with her neurosurgeon.  She continues to stay at the [facility] center and is working with PT on a daily basis.   Other medical  conditions are listed as follows:   Spinal stenosis, thoracic  History of gastroesophageal reflux (GERD)  History of diverticulitis  History of hypertension  Vitamin D deficiency  Memory loss   Orders: No orders of the defined types were placed in this encounter.  No orders of the defined types were placed in this encounter.   Face-to-face time spent with patient was 40 minutes. Greater than 50% of time was spent in counseling and coordination of care.  Follow-Up Instructions: Return in about 4 months (around 02/02/2019) for Polymyalgia Rheumatica, Osteoarthritis, DDD.   Ofilia Neas, PA-C   I examined and evaluated the patient with Hazel Sams PA.  Patient has been doing well on tapering schedule of prednisone.  On my examination she did not have any temporal arteritis.  She has some generalized deconditioning.  She will benefit from regular physical therapy.  Prednisone taper was discussed at length.  Written instructions were given.  The plan of care was discussed as noted above.  Bo Merino, MD  Note - This record has been created using Editor, commissioning.  Chart creation errors have been sought, but may not always  have been located. Such creation errors do not reflect on  the standard of medical care.

## 2018-10-03 NOTE — Patient Instructions (Addendum)
Prednisone taper:  15 mg for 1 month 12.5 mg for 1 month  10 mg for 1 month Then decrease by 1 mg every month   If develop temporal artery tenderness call our office ASAP   Ok to continue to hold Plaquenil    We recommend continuing with physical therapy on a daily basis for muscle strengthening and mobility

## 2018-10-28 DIAGNOSIS — I10 Essential (primary) hypertension: Secondary | ICD-10-CM | POA: Diagnosis not present

## 2018-10-28 DIAGNOSIS — M5137 Other intervertebral disc degeneration, lumbosacral region: Secondary | ICD-10-CM | POA: Diagnosis not present

## 2018-10-28 DIAGNOSIS — I5032 Chronic diastolic (congestive) heart failure: Secondary | ICD-10-CM | POA: Diagnosis not present

## 2018-10-28 DIAGNOSIS — M48062 Spinal stenosis, lumbar region with neurogenic claudication: Secondary | ICD-10-CM | POA: Diagnosis not present

## 2018-11-06 DIAGNOSIS — M549 Dorsalgia, unspecified: Secondary | ICD-10-CM | POA: Diagnosis not present

## 2018-12-02 DIAGNOSIS — D500 Iron deficiency anemia secondary to blood loss (chronic): Secondary | ICD-10-CM | POA: Diagnosis not present

## 2018-12-02 DIAGNOSIS — K219 Gastro-esophageal reflux disease without esophagitis: Secondary | ICD-10-CM | POA: Diagnosis not present

## 2018-12-02 DIAGNOSIS — I10 Essential (primary) hypertension: Secondary | ICD-10-CM | POA: Diagnosis not present

## 2018-12-02 DIAGNOSIS — K5900 Constipation, unspecified: Secondary | ICD-10-CM | POA: Diagnosis not present

## 2018-12-07 DIAGNOSIS — D649 Anemia, unspecified: Secondary | ICD-10-CM | POA: Diagnosis not present

## 2018-12-07 DIAGNOSIS — I10 Essential (primary) hypertension: Secondary | ICD-10-CM | POA: Diagnosis not present

## 2018-12-07 DIAGNOSIS — Z79899 Other long term (current) drug therapy: Secondary | ICD-10-CM | POA: Diagnosis not present

## 2018-12-11 DIAGNOSIS — K219 Gastro-esophageal reflux disease without esophagitis: Secondary | ICD-10-CM | POA: Diagnosis not present

## 2018-12-11 DIAGNOSIS — I10 Essential (primary) hypertension: Secondary | ICD-10-CM | POA: Diagnosis not present

## 2018-12-11 DIAGNOSIS — I5032 Chronic diastolic (congestive) heart failure: Secondary | ICD-10-CM | POA: Diagnosis not present

## 2018-12-11 DIAGNOSIS — E876 Hypokalemia: Secondary | ICD-10-CM | POA: Diagnosis not present

## 2018-12-13 DIAGNOSIS — D649 Anemia, unspecified: Secondary | ICD-10-CM | POA: Diagnosis not present

## 2018-12-13 DIAGNOSIS — I10 Essential (primary) hypertension: Secondary | ICD-10-CM | POA: Diagnosis not present

## 2018-12-15 DIAGNOSIS — I130 Hypertensive heart and chronic kidney disease with heart failure and stage 1 through stage 4 chronic kidney disease, or unspecified chronic kidney disease: Secondary | ICD-10-CM | POA: Diagnosis not present

## 2018-12-15 DIAGNOSIS — D631 Anemia in chronic kidney disease: Secondary | ICD-10-CM | POA: Diagnosis not present

## 2018-12-15 DIAGNOSIS — G894 Chronic pain syndrome: Secondary | ICD-10-CM | POA: Diagnosis not present

## 2018-12-15 DIAGNOSIS — M48062 Spinal stenosis, lumbar region with neurogenic claudication: Secondary | ICD-10-CM | POA: Diagnosis not present

## 2018-12-15 DIAGNOSIS — R262 Difficulty in walking, not elsewhere classified: Secondary | ICD-10-CM | POA: Diagnosis not present

## 2018-12-15 DIAGNOSIS — M961 Postlaminectomy syndrome, not elsewhere classified: Secondary | ICD-10-CM | POA: Diagnosis not present

## 2018-12-15 DIAGNOSIS — M353 Polymyalgia rheumatica: Secondary | ICD-10-CM | POA: Diagnosis not present

## 2018-12-15 DIAGNOSIS — N183 Chronic kidney disease, stage 3 (moderate): Secondary | ICD-10-CM | POA: Diagnosis not present

## 2018-12-15 DIAGNOSIS — I5032 Chronic diastolic (congestive) heart failure: Secondary | ICD-10-CM | POA: Diagnosis not present

## 2018-12-15 DIAGNOSIS — M6281 Muscle weakness (generalized): Secondary | ICD-10-CM | POA: Diagnosis not present

## 2018-12-15 DIAGNOSIS — Z6841 Body Mass Index (BMI) 40.0 and over, adult: Secondary | ICD-10-CM | POA: Diagnosis not present

## 2018-12-16 DIAGNOSIS — G894 Chronic pain syndrome: Secondary | ICD-10-CM | POA: Diagnosis not present

## 2018-12-16 DIAGNOSIS — N183 Chronic kidney disease, stage 3 (moderate): Secondary | ICD-10-CM | POA: Diagnosis not present

## 2018-12-16 DIAGNOSIS — I5032 Chronic diastolic (congestive) heart failure: Secondary | ICD-10-CM | POA: Diagnosis not present

## 2018-12-16 DIAGNOSIS — M48062 Spinal stenosis, lumbar region with neurogenic claudication: Secondary | ICD-10-CM | POA: Diagnosis not present

## 2018-12-16 DIAGNOSIS — I130 Hypertensive heart and chronic kidney disease with heart failure and stage 1 through stage 4 chronic kidney disease, or unspecified chronic kidney disease: Secondary | ICD-10-CM | POA: Diagnosis not present

## 2018-12-16 DIAGNOSIS — M961 Postlaminectomy syndrome, not elsewhere classified: Secondary | ICD-10-CM | POA: Diagnosis not present

## 2018-12-17 DIAGNOSIS — M48062 Spinal stenosis, lumbar region with neurogenic claudication: Secondary | ICD-10-CM | POA: Diagnosis not present

## 2018-12-17 DIAGNOSIS — I5032 Chronic diastolic (congestive) heart failure: Secondary | ICD-10-CM | POA: Diagnosis not present

## 2018-12-17 DIAGNOSIS — M961 Postlaminectomy syndrome, not elsewhere classified: Secondary | ICD-10-CM | POA: Diagnosis not present

## 2018-12-17 DIAGNOSIS — G894 Chronic pain syndrome: Secondary | ICD-10-CM | POA: Diagnosis not present

## 2018-12-17 DIAGNOSIS — N183 Chronic kidney disease, stage 3 (moderate): Secondary | ICD-10-CM | POA: Diagnosis not present

## 2018-12-17 DIAGNOSIS — I130 Hypertensive heart and chronic kidney disease with heart failure and stage 1 through stage 4 chronic kidney disease, or unspecified chronic kidney disease: Secondary | ICD-10-CM | POA: Diagnosis not present

## 2018-12-18 DIAGNOSIS — M48062 Spinal stenosis, lumbar region with neurogenic claudication: Secondary | ICD-10-CM | POA: Diagnosis not present

## 2018-12-18 DIAGNOSIS — I130 Hypertensive heart and chronic kidney disease with heart failure and stage 1 through stage 4 chronic kidney disease, or unspecified chronic kidney disease: Secondary | ICD-10-CM | POA: Diagnosis not present

## 2018-12-18 DIAGNOSIS — I5032 Chronic diastolic (congestive) heart failure: Secondary | ICD-10-CM | POA: Diagnosis not present

## 2018-12-18 DIAGNOSIS — G894 Chronic pain syndrome: Secondary | ICD-10-CM | POA: Diagnosis not present

## 2018-12-18 DIAGNOSIS — N183 Chronic kidney disease, stage 3 (moderate): Secondary | ICD-10-CM | POA: Diagnosis not present

## 2018-12-18 DIAGNOSIS — M961 Postlaminectomy syndrome, not elsewhere classified: Secondary | ICD-10-CM | POA: Diagnosis not present

## 2018-12-19 DIAGNOSIS — M069 Rheumatoid arthritis, unspecified: Secondary | ICD-10-CM | POA: Diagnosis not present

## 2018-12-19 DIAGNOSIS — I509 Heart failure, unspecified: Secondary | ICD-10-CM | POA: Diagnosis not present

## 2018-12-19 DIAGNOSIS — M316 Other giant cell arteritis: Secondary | ICD-10-CM | POA: Diagnosis not present

## 2018-12-19 DIAGNOSIS — I10 Essential (primary) hypertension: Secondary | ICD-10-CM | POA: Diagnosis not present

## 2018-12-19 DIAGNOSIS — R600 Localized edema: Secondary | ICD-10-CM | POA: Diagnosis not present

## 2018-12-19 DIAGNOSIS — Z681 Body mass index (BMI) 19 or less, adult: Secondary | ICD-10-CM | POA: Diagnosis not present

## 2018-12-19 DIAGNOSIS — M150 Primary generalized (osteo)arthritis: Secondary | ICD-10-CM | POA: Diagnosis not present

## 2018-12-19 DIAGNOSIS — Z1389 Encounter for screening for other disorder: Secondary | ICD-10-CM | POA: Diagnosis not present

## 2018-12-19 DIAGNOSIS — M353 Polymyalgia rheumatica: Secondary | ICD-10-CM | POA: Diagnosis not present

## 2018-12-20 DIAGNOSIS — I5032 Chronic diastolic (congestive) heart failure: Secondary | ICD-10-CM | POA: Diagnosis not present

## 2018-12-20 DIAGNOSIS — G894 Chronic pain syndrome: Secondary | ICD-10-CM | POA: Diagnosis not present

## 2018-12-20 DIAGNOSIS — M961 Postlaminectomy syndrome, not elsewhere classified: Secondary | ICD-10-CM | POA: Diagnosis not present

## 2018-12-20 DIAGNOSIS — M48062 Spinal stenosis, lumbar region with neurogenic claudication: Secondary | ICD-10-CM | POA: Diagnosis not present

## 2018-12-20 DIAGNOSIS — I130 Hypertensive heart and chronic kidney disease with heart failure and stage 1 through stage 4 chronic kidney disease, or unspecified chronic kidney disease: Secondary | ICD-10-CM | POA: Diagnosis not present

## 2018-12-20 DIAGNOSIS — N183 Chronic kidney disease, stage 3 (moderate): Secondary | ICD-10-CM | POA: Diagnosis not present

## 2018-12-23 DIAGNOSIS — M961 Postlaminectomy syndrome, not elsewhere classified: Secondary | ICD-10-CM | POA: Diagnosis not present

## 2018-12-23 DIAGNOSIS — M48062 Spinal stenosis, lumbar region with neurogenic claudication: Secondary | ICD-10-CM | POA: Diagnosis not present

## 2018-12-23 DIAGNOSIS — I5032 Chronic diastolic (congestive) heart failure: Secondary | ICD-10-CM | POA: Diagnosis not present

## 2018-12-23 DIAGNOSIS — I130 Hypertensive heart and chronic kidney disease with heart failure and stage 1 through stage 4 chronic kidney disease, or unspecified chronic kidney disease: Secondary | ICD-10-CM | POA: Diagnosis not present

## 2018-12-23 DIAGNOSIS — G894 Chronic pain syndrome: Secondary | ICD-10-CM | POA: Diagnosis not present

## 2018-12-23 DIAGNOSIS — N183 Chronic kidney disease, stage 3 (moderate): Secondary | ICD-10-CM | POA: Diagnosis not present

## 2018-12-24 DIAGNOSIS — M961 Postlaminectomy syndrome, not elsewhere classified: Secondary | ICD-10-CM | POA: Diagnosis not present

## 2018-12-24 DIAGNOSIS — I5032 Chronic diastolic (congestive) heart failure: Secondary | ICD-10-CM | POA: Diagnosis not present

## 2018-12-24 DIAGNOSIS — M48062 Spinal stenosis, lumbar region with neurogenic claudication: Secondary | ICD-10-CM | POA: Diagnosis not present

## 2018-12-24 DIAGNOSIS — N183 Chronic kidney disease, stage 3 (moderate): Secondary | ICD-10-CM | POA: Diagnosis not present

## 2018-12-24 DIAGNOSIS — G894 Chronic pain syndrome: Secondary | ICD-10-CM | POA: Diagnosis not present

## 2018-12-24 DIAGNOSIS — I130 Hypertensive heart and chronic kidney disease with heart failure and stage 1 through stage 4 chronic kidney disease, or unspecified chronic kidney disease: Secondary | ICD-10-CM | POA: Diagnosis not present

## 2018-12-26 DIAGNOSIS — M48062 Spinal stenosis, lumbar region with neurogenic claudication: Secondary | ICD-10-CM | POA: Diagnosis not present

## 2018-12-26 DIAGNOSIS — N183 Chronic kidney disease, stage 3 (moderate): Secondary | ICD-10-CM | POA: Diagnosis not present

## 2018-12-26 DIAGNOSIS — M961 Postlaminectomy syndrome, not elsewhere classified: Secondary | ICD-10-CM | POA: Diagnosis not present

## 2018-12-26 DIAGNOSIS — I5032 Chronic diastolic (congestive) heart failure: Secondary | ICD-10-CM | POA: Diagnosis not present

## 2018-12-26 DIAGNOSIS — G894 Chronic pain syndrome: Secondary | ICD-10-CM | POA: Diagnosis not present

## 2018-12-26 DIAGNOSIS — I130 Hypertensive heart and chronic kidney disease with heart failure and stage 1 through stage 4 chronic kidney disease, or unspecified chronic kidney disease: Secondary | ICD-10-CM | POA: Diagnosis not present

## 2018-12-27 DIAGNOSIS — G894 Chronic pain syndrome: Secondary | ICD-10-CM | POA: Diagnosis not present

## 2018-12-27 DIAGNOSIS — M48062 Spinal stenosis, lumbar region with neurogenic claudication: Secondary | ICD-10-CM | POA: Diagnosis not present

## 2018-12-27 DIAGNOSIS — I5032 Chronic diastolic (congestive) heart failure: Secondary | ICD-10-CM | POA: Diagnosis not present

## 2018-12-27 DIAGNOSIS — M961 Postlaminectomy syndrome, not elsewhere classified: Secondary | ICD-10-CM | POA: Diagnosis not present

## 2018-12-27 DIAGNOSIS — I130 Hypertensive heart and chronic kidney disease with heart failure and stage 1 through stage 4 chronic kidney disease, or unspecified chronic kidney disease: Secondary | ICD-10-CM | POA: Diagnosis not present

## 2018-12-27 DIAGNOSIS — N183 Chronic kidney disease, stage 3 (moderate): Secondary | ICD-10-CM | POA: Diagnosis not present

## 2018-12-28 DIAGNOSIS — I5032 Chronic diastolic (congestive) heart failure: Secondary | ICD-10-CM | POA: Diagnosis not present

## 2018-12-28 DIAGNOSIS — M961 Postlaminectomy syndrome, not elsewhere classified: Secondary | ICD-10-CM | POA: Diagnosis not present

## 2018-12-28 DIAGNOSIS — M48062 Spinal stenosis, lumbar region with neurogenic claudication: Secondary | ICD-10-CM | POA: Diagnosis not present

## 2018-12-28 DIAGNOSIS — G894 Chronic pain syndrome: Secondary | ICD-10-CM | POA: Diagnosis not present

## 2018-12-28 DIAGNOSIS — I130 Hypertensive heart and chronic kidney disease with heart failure and stage 1 through stage 4 chronic kidney disease, or unspecified chronic kidney disease: Secondary | ICD-10-CM | POA: Diagnosis not present

## 2018-12-28 DIAGNOSIS — N183 Chronic kidney disease, stage 3 (moderate): Secondary | ICD-10-CM | POA: Diagnosis not present

## 2018-12-30 DIAGNOSIS — M961 Postlaminectomy syndrome, not elsewhere classified: Secondary | ICD-10-CM | POA: Diagnosis not present

## 2018-12-30 DIAGNOSIS — N183 Chronic kidney disease, stage 3 (moderate): Secondary | ICD-10-CM | POA: Diagnosis not present

## 2018-12-30 DIAGNOSIS — G894 Chronic pain syndrome: Secondary | ICD-10-CM | POA: Diagnosis not present

## 2018-12-30 DIAGNOSIS — I130 Hypertensive heart and chronic kidney disease with heart failure and stage 1 through stage 4 chronic kidney disease, or unspecified chronic kidney disease: Secondary | ICD-10-CM | POA: Diagnosis not present

## 2018-12-30 DIAGNOSIS — I5032 Chronic diastolic (congestive) heart failure: Secondary | ICD-10-CM | POA: Diagnosis not present

## 2018-12-30 DIAGNOSIS — M48062 Spinal stenosis, lumbar region with neurogenic claudication: Secondary | ICD-10-CM | POA: Diagnosis not present

## 2018-12-31 DIAGNOSIS — N183 Chronic kidney disease, stage 3 (moderate): Secondary | ICD-10-CM | POA: Diagnosis not present

## 2018-12-31 DIAGNOSIS — G894 Chronic pain syndrome: Secondary | ICD-10-CM | POA: Diagnosis not present

## 2018-12-31 DIAGNOSIS — M48062 Spinal stenosis, lumbar region with neurogenic claudication: Secondary | ICD-10-CM | POA: Diagnosis not present

## 2018-12-31 DIAGNOSIS — I130 Hypertensive heart and chronic kidney disease with heart failure and stage 1 through stage 4 chronic kidney disease, or unspecified chronic kidney disease: Secondary | ICD-10-CM | POA: Diagnosis not present

## 2018-12-31 DIAGNOSIS — M961 Postlaminectomy syndrome, not elsewhere classified: Secondary | ICD-10-CM | POA: Diagnosis not present

## 2018-12-31 DIAGNOSIS — I5032 Chronic diastolic (congestive) heart failure: Secondary | ICD-10-CM | POA: Diagnosis not present

## 2019-01-01 DIAGNOSIS — M48062 Spinal stenosis, lumbar region with neurogenic claudication: Secondary | ICD-10-CM | POA: Diagnosis not present

## 2019-01-01 DIAGNOSIS — M961 Postlaminectomy syndrome, not elsewhere classified: Secondary | ICD-10-CM | POA: Diagnosis not present

## 2019-01-01 DIAGNOSIS — N183 Chronic kidney disease, stage 3 (moderate): Secondary | ICD-10-CM | POA: Diagnosis not present

## 2019-01-01 DIAGNOSIS — I130 Hypertensive heart and chronic kidney disease with heart failure and stage 1 through stage 4 chronic kidney disease, or unspecified chronic kidney disease: Secondary | ICD-10-CM | POA: Diagnosis not present

## 2019-01-01 DIAGNOSIS — I5032 Chronic diastolic (congestive) heart failure: Secondary | ICD-10-CM | POA: Diagnosis not present

## 2019-01-01 DIAGNOSIS — G894 Chronic pain syndrome: Secondary | ICD-10-CM | POA: Diagnosis not present

## 2019-01-02 DIAGNOSIS — I130 Hypertensive heart and chronic kidney disease with heart failure and stage 1 through stage 4 chronic kidney disease, or unspecified chronic kidney disease: Secondary | ICD-10-CM | POA: Diagnosis not present

## 2019-01-02 DIAGNOSIS — I5032 Chronic diastolic (congestive) heart failure: Secondary | ICD-10-CM | POA: Diagnosis not present

## 2019-01-02 DIAGNOSIS — M961 Postlaminectomy syndrome, not elsewhere classified: Secondary | ICD-10-CM | POA: Diagnosis not present

## 2019-01-02 DIAGNOSIS — G894 Chronic pain syndrome: Secondary | ICD-10-CM | POA: Diagnosis not present

## 2019-01-02 DIAGNOSIS — N183 Chronic kidney disease, stage 3 (moderate): Secondary | ICD-10-CM | POA: Diagnosis not present

## 2019-01-02 DIAGNOSIS — M48062 Spinal stenosis, lumbar region with neurogenic claudication: Secondary | ICD-10-CM | POA: Diagnosis not present

## 2019-01-03 DIAGNOSIS — G894 Chronic pain syndrome: Secondary | ICD-10-CM | POA: Diagnosis not present

## 2019-01-03 DIAGNOSIS — M961 Postlaminectomy syndrome, not elsewhere classified: Secondary | ICD-10-CM | POA: Diagnosis not present

## 2019-01-03 DIAGNOSIS — M48062 Spinal stenosis, lumbar region with neurogenic claudication: Secondary | ICD-10-CM | POA: Diagnosis not present

## 2019-01-03 DIAGNOSIS — I130 Hypertensive heart and chronic kidney disease with heart failure and stage 1 through stage 4 chronic kidney disease, or unspecified chronic kidney disease: Secondary | ICD-10-CM | POA: Diagnosis not present

## 2019-01-03 DIAGNOSIS — N183 Chronic kidney disease, stage 3 (moderate): Secondary | ICD-10-CM | POA: Diagnosis not present

## 2019-01-03 DIAGNOSIS — I5032 Chronic diastolic (congestive) heart failure: Secondary | ICD-10-CM | POA: Diagnosis not present

## 2019-01-08 DIAGNOSIS — I130 Hypertensive heart and chronic kidney disease with heart failure and stage 1 through stage 4 chronic kidney disease, or unspecified chronic kidney disease: Secondary | ICD-10-CM | POA: Diagnosis not present

## 2019-01-08 DIAGNOSIS — G894 Chronic pain syndrome: Secondary | ICD-10-CM | POA: Diagnosis not present

## 2019-01-08 DIAGNOSIS — M961 Postlaminectomy syndrome, not elsewhere classified: Secondary | ICD-10-CM | POA: Diagnosis not present

## 2019-01-08 DIAGNOSIS — N183 Chronic kidney disease, stage 3 (moderate): Secondary | ICD-10-CM | POA: Diagnosis not present

## 2019-01-08 DIAGNOSIS — I5032 Chronic diastolic (congestive) heart failure: Secondary | ICD-10-CM | POA: Diagnosis not present

## 2019-01-08 DIAGNOSIS — M48062 Spinal stenosis, lumbar region with neurogenic claudication: Secondary | ICD-10-CM | POA: Diagnosis not present

## 2019-01-09 DIAGNOSIS — B0229 Other postherpetic nervous system involvement: Secondary | ICD-10-CM | POA: Diagnosis not present

## 2019-01-09 DIAGNOSIS — R52 Pain, unspecified: Secondary | ICD-10-CM | POA: Diagnosis not present

## 2019-01-09 DIAGNOSIS — M353 Polymyalgia rheumatica: Secondary | ICD-10-CM | POA: Diagnosis not present

## 2019-01-09 DIAGNOSIS — I10 Essential (primary) hypertension: Secondary | ICD-10-CM | POA: Diagnosis not present

## 2019-01-10 ENCOUNTER — Encounter: Payer: Self-pay | Admitting: Rheumatology

## 2019-01-10 DIAGNOSIS — I130 Hypertensive heart and chronic kidney disease with heart failure and stage 1 through stage 4 chronic kidney disease, or unspecified chronic kidney disease: Secondary | ICD-10-CM | POA: Diagnosis not present

## 2019-01-10 DIAGNOSIS — D631 Anemia in chronic kidney disease: Secondary | ICD-10-CM | POA: Diagnosis not present

## 2019-01-10 DIAGNOSIS — M961 Postlaminectomy syndrome, not elsewhere classified: Secondary | ICD-10-CM | POA: Diagnosis not present

## 2019-01-10 DIAGNOSIS — M48062 Spinal stenosis, lumbar region with neurogenic claudication: Secondary | ICD-10-CM | POA: Diagnosis not present

## 2019-01-10 DIAGNOSIS — N183 Chronic kidney disease, stage 3 (moderate): Secondary | ICD-10-CM | POA: Diagnosis not present

## 2019-01-10 DIAGNOSIS — G894 Chronic pain syndrome: Secondary | ICD-10-CM | POA: Diagnosis not present

## 2019-01-10 DIAGNOSIS — I5032 Chronic diastolic (congestive) heart failure: Secondary | ICD-10-CM | POA: Diagnosis not present

## 2019-01-13 DIAGNOSIS — N183 Chronic kidney disease, stage 3 (moderate): Secondary | ICD-10-CM | POA: Diagnosis not present

## 2019-01-13 DIAGNOSIS — M961 Postlaminectomy syndrome, not elsewhere classified: Secondary | ICD-10-CM | POA: Diagnosis not present

## 2019-01-13 DIAGNOSIS — M48062 Spinal stenosis, lumbar region with neurogenic claudication: Secondary | ICD-10-CM | POA: Diagnosis not present

## 2019-01-13 DIAGNOSIS — I130 Hypertensive heart and chronic kidney disease with heart failure and stage 1 through stage 4 chronic kidney disease, or unspecified chronic kidney disease: Secondary | ICD-10-CM | POA: Diagnosis not present

## 2019-01-13 DIAGNOSIS — G894 Chronic pain syndrome: Secondary | ICD-10-CM | POA: Diagnosis not present

## 2019-01-13 DIAGNOSIS — I5032 Chronic diastolic (congestive) heart failure: Secondary | ICD-10-CM | POA: Diagnosis not present

## 2019-01-14 DIAGNOSIS — M6281 Muscle weakness (generalized): Secondary | ICD-10-CM | POA: Diagnosis not present

## 2019-01-14 DIAGNOSIS — I130 Hypertensive heart and chronic kidney disease with heart failure and stage 1 through stage 4 chronic kidney disease, or unspecified chronic kidney disease: Secondary | ICD-10-CM | POA: Diagnosis not present

## 2019-01-14 DIAGNOSIS — N183 Chronic kidney disease, stage 3 (moderate): Secondary | ICD-10-CM | POA: Diagnosis not present

## 2019-01-14 DIAGNOSIS — Z6841 Body Mass Index (BMI) 40.0 and over, adult: Secondary | ICD-10-CM | POA: Diagnosis not present

## 2019-01-14 DIAGNOSIS — R262 Difficulty in walking, not elsewhere classified: Secondary | ICD-10-CM | POA: Diagnosis not present

## 2019-01-14 DIAGNOSIS — M961 Postlaminectomy syndrome, not elsewhere classified: Secondary | ICD-10-CM | POA: Diagnosis not present

## 2019-01-14 DIAGNOSIS — D631 Anemia in chronic kidney disease: Secondary | ICD-10-CM | POA: Diagnosis not present

## 2019-01-14 DIAGNOSIS — I5032 Chronic diastolic (congestive) heart failure: Secondary | ICD-10-CM | POA: Diagnosis not present

## 2019-01-14 DIAGNOSIS — G894 Chronic pain syndrome: Secondary | ICD-10-CM | POA: Diagnosis not present

## 2019-01-14 DIAGNOSIS — M48062 Spinal stenosis, lumbar region with neurogenic claudication: Secondary | ICD-10-CM | POA: Diagnosis not present

## 2019-01-14 DIAGNOSIS — M353 Polymyalgia rheumatica: Secondary | ICD-10-CM | POA: Diagnosis not present

## 2019-01-16 DIAGNOSIS — I5032 Chronic diastolic (congestive) heart failure: Secondary | ICD-10-CM | POA: Diagnosis not present

## 2019-01-16 DIAGNOSIS — N183 Chronic kidney disease, stage 3 (moderate): Secondary | ICD-10-CM | POA: Diagnosis not present

## 2019-01-16 DIAGNOSIS — I130 Hypertensive heart and chronic kidney disease with heart failure and stage 1 through stage 4 chronic kidney disease, or unspecified chronic kidney disease: Secondary | ICD-10-CM | POA: Diagnosis not present

## 2019-01-16 DIAGNOSIS — G894 Chronic pain syndrome: Secondary | ICD-10-CM | POA: Diagnosis not present

## 2019-01-16 DIAGNOSIS — M961 Postlaminectomy syndrome, not elsewhere classified: Secondary | ICD-10-CM | POA: Diagnosis not present

## 2019-01-16 DIAGNOSIS — M48062 Spinal stenosis, lumbar region with neurogenic claudication: Secondary | ICD-10-CM | POA: Diagnosis not present

## 2019-01-20 DIAGNOSIS — G894 Chronic pain syndrome: Secondary | ICD-10-CM | POA: Diagnosis not present

## 2019-01-20 DIAGNOSIS — I130 Hypertensive heart and chronic kidney disease with heart failure and stage 1 through stage 4 chronic kidney disease, or unspecified chronic kidney disease: Secondary | ICD-10-CM | POA: Diagnosis not present

## 2019-01-20 DIAGNOSIS — N183 Chronic kidney disease, stage 3 (moderate): Secondary | ICD-10-CM | POA: Diagnosis not present

## 2019-01-20 DIAGNOSIS — M48062 Spinal stenosis, lumbar region with neurogenic claudication: Secondary | ICD-10-CM | POA: Diagnosis not present

## 2019-01-20 DIAGNOSIS — I5032 Chronic diastolic (congestive) heart failure: Secondary | ICD-10-CM | POA: Diagnosis not present

## 2019-01-20 DIAGNOSIS — M961 Postlaminectomy syndrome, not elsewhere classified: Secondary | ICD-10-CM | POA: Diagnosis not present

## 2019-01-21 DIAGNOSIS — M48062 Spinal stenosis, lumbar region with neurogenic claudication: Secondary | ICD-10-CM | POA: Diagnosis not present

## 2019-01-21 DIAGNOSIS — M961 Postlaminectomy syndrome, not elsewhere classified: Secondary | ICD-10-CM | POA: Diagnosis not present

## 2019-01-21 DIAGNOSIS — G894 Chronic pain syndrome: Secondary | ICD-10-CM | POA: Diagnosis not present

## 2019-01-21 DIAGNOSIS — N183 Chronic kidney disease, stage 3 (moderate): Secondary | ICD-10-CM | POA: Diagnosis not present

## 2019-01-21 DIAGNOSIS — I5032 Chronic diastolic (congestive) heart failure: Secondary | ICD-10-CM | POA: Diagnosis not present

## 2019-01-21 DIAGNOSIS — I130 Hypertensive heart and chronic kidney disease with heart failure and stage 1 through stage 4 chronic kidney disease, or unspecified chronic kidney disease: Secondary | ICD-10-CM | POA: Diagnosis not present

## 2019-01-22 DIAGNOSIS — M48062 Spinal stenosis, lumbar region with neurogenic claudication: Secondary | ICD-10-CM | POA: Diagnosis not present

## 2019-01-22 DIAGNOSIS — N183 Chronic kidney disease, stage 3 (moderate): Secondary | ICD-10-CM | POA: Diagnosis not present

## 2019-01-22 DIAGNOSIS — I5032 Chronic diastolic (congestive) heart failure: Secondary | ICD-10-CM | POA: Diagnosis not present

## 2019-01-22 DIAGNOSIS — M961 Postlaminectomy syndrome, not elsewhere classified: Secondary | ICD-10-CM | POA: Diagnosis not present

## 2019-01-22 DIAGNOSIS — I130 Hypertensive heart and chronic kidney disease with heart failure and stage 1 through stage 4 chronic kidney disease, or unspecified chronic kidney disease: Secondary | ICD-10-CM | POA: Diagnosis not present

## 2019-01-22 DIAGNOSIS — G894 Chronic pain syndrome: Secondary | ICD-10-CM | POA: Diagnosis not present

## 2019-01-24 DIAGNOSIS — M961 Postlaminectomy syndrome, not elsewhere classified: Secondary | ICD-10-CM | POA: Diagnosis not present

## 2019-01-24 DIAGNOSIS — G894 Chronic pain syndrome: Secondary | ICD-10-CM | POA: Diagnosis not present

## 2019-01-24 DIAGNOSIS — I130 Hypertensive heart and chronic kidney disease with heart failure and stage 1 through stage 4 chronic kidney disease, or unspecified chronic kidney disease: Secondary | ICD-10-CM | POA: Diagnosis not present

## 2019-01-24 DIAGNOSIS — M48062 Spinal stenosis, lumbar region with neurogenic claudication: Secondary | ICD-10-CM | POA: Diagnosis not present

## 2019-01-27 DIAGNOSIS — I5032 Chronic diastolic (congestive) heart failure: Secondary | ICD-10-CM | POA: Diagnosis not present

## 2019-01-27 DIAGNOSIS — G894 Chronic pain syndrome: Secondary | ICD-10-CM | POA: Diagnosis not present

## 2019-01-27 DIAGNOSIS — M961 Postlaminectomy syndrome, not elsewhere classified: Secondary | ICD-10-CM | POA: Diagnosis not present

## 2019-01-27 DIAGNOSIS — I130 Hypertensive heart and chronic kidney disease with heart failure and stage 1 through stage 4 chronic kidney disease, or unspecified chronic kidney disease: Secondary | ICD-10-CM | POA: Diagnosis not present

## 2019-01-27 DIAGNOSIS — N183 Chronic kidney disease, stage 3 (moderate): Secondary | ICD-10-CM | POA: Diagnosis not present

## 2019-01-27 DIAGNOSIS — M48062 Spinal stenosis, lumbar region with neurogenic claudication: Secondary | ICD-10-CM | POA: Diagnosis not present

## 2019-01-28 NOTE — Progress Notes (Signed)
Virtual Visit via Telephone Note  I connected with Nicole [name] on 01/28/19 at 10:45 AM EDT by telephone and verified that I am speaking with the correct person using two identifiers.   I discussed the limitations, risks, security and privacy concerns of performing an evaluation and management service by telephone and the availability of in person appointments. I also discussed with the patient that there may be a patient responsible charge related to this service. The patient expressed understanding and agreed to proceed.  CC: Discuss medications   History of Present Illness: Patient is a 73 year old female with a past medical history of polymyalgia rheumatica, temporal arteritis, osteoarthritis, and DDD.  She reports overall she is feeling much better.  She still has some physical deconditioning.  She continues to use a wheelchair most of the day. She is home from the assisted nursing facility.  She has completed physical therapy. She was tapered off of prednisone prior to being discharged in February 2020.  She has not had any signs or symptoms of a PMR flare. She denies any headaches.  She denies any increased muscle aches or muscle tenderness.    Review of Systems  Constitutional: Positive for fever. Negative for malaise/fatigue.  Eyes: Negative for photophobia, pain, discharge and redness.  Respiratory: Negative for cough, shortness of breath and wheezing.   Cardiovascular: Negative for chest pain and palpitations.  Gastrointestinal: Negative for blood in stool, constipation and diarrhea.  Genitourinary: Negative for dysuria.  Musculoskeletal: Positive for back pain and myalgias. Negative for joint pain and neck pain.  Skin: Negative for rash.  Neurological: Negative for dizziness and headaches.  Psychiatric/Behavioral: Negative for depression. The patient is not nervous/anxious and does not have insomnia.    Observations/Objective:  Physical Exam  Constitutional: She is oriented to  person, place, and time.  Neurological: She is alert and oriented to person, place, and time.  Psychiatric: Mood, memory, affect and judgment normal.   Patient reports morning stiffness for 1 hour.   Patient denies nocturnal pain.  Difficulty dressing/grooming: Denies Difficulty climbing stairs: Reports Difficulty getting out of chair: Reports Difficulty using hands for taps, buttons, cutlery, and/or writing: Denies  Assessment and Plan: Visit Diagnoses: Polymyalgia rheumatica ([place]):  She has not had any signs or symptoms of a flare.  She continues to have some weakness due to physical deconditioning.  She continues to use a walker or a wheelchair for assistance. She was discharged from the skilled nursing facility at the end of February 2020.  She has been off of prednisone since being discharged.  She will be restarted on a gradual prednisone taper starting at 10 mg po daily for 1 month and reduce by 1 mg every month.  We will send in the prescription and send her instructions to follow.  She does not want to restart on PLQ at this time.  She was advised to notify us if she develops any signs or symptoms of a flare.  She will follow up in 5 months.   Temporal arteritis ([place]) - She refused to get a temporal artery biopsy previously.  She has not had any recent headaches or temporal artery tenderness.  No blurred vision.    On prednisone therapy: She has been off of Prednisone since the end of February 2020. She will be restarting on prednisone 10 mg po daily for 1 month and reduce by 1 mg every month to prevent a flare. A prescription will be sent today.   High risk medication  use -She will be restarting on prednisone 10 mg po daily for 1 month and taper by 1 mg every month. She has been holding PLQ since July 2019. Eye exam: 12/13/2017.  She will have lab work sent to our office.   Rheumatoid factor positive: She has no joint swelling.  Positive anti-CCP test:  She has no joint swelling  at this time.   Chronic pain syndrome: She is taking Nucynta 50 mg TID PRN for pain relief.  Primary osteoarthritis of both knees: She has no knee joint pain or joint swelling at this time.   DDD (degenerative disc disease), lumbar: She continues to follow up with her neurosurgeon.  She has a post-op infection and had a PICC line for 6 weeks.  She still has some soreness in her lower back.  She uses a walker and wheelchair for assistance.      Follow Up Instructions: She will follow up in 5 months Take prednisone 10 mg po daily for 1 month then reduce by 1 mg every month.  We will send her tapering instructions.    I discussed the assessment and treatment plan with the patient. The patient was provided an opportunity to ask questions and all were answered. The patient agreed with the plan and demonstrated an understanding of the instructions.   The patient was advised to call back or seek an in-person evaluation if the symptoms worsen or if the condition fails to improve as anticipated.  I provided 22 minutes of non-face-to-face time during this encounter.  Bo Merino, MD   Scribed by-  Hazel Sams, PA-C

## 2019-01-30 DIAGNOSIS — N183 Chronic kidney disease, stage 3 (moderate): Secondary | ICD-10-CM | POA: Diagnosis not present

## 2019-01-30 DIAGNOSIS — G894 Chronic pain syndrome: Secondary | ICD-10-CM | POA: Diagnosis not present

## 2019-01-30 DIAGNOSIS — I130 Hypertensive heart and chronic kidney disease with heart failure and stage 1 through stage 4 chronic kidney disease, or unspecified chronic kidney disease: Secondary | ICD-10-CM | POA: Diagnosis not present

## 2019-01-30 DIAGNOSIS — I5032 Chronic diastolic (congestive) heart failure: Secondary | ICD-10-CM | POA: Diagnosis not present

## 2019-01-30 DIAGNOSIS — M961 Postlaminectomy syndrome, not elsewhere classified: Secondary | ICD-10-CM | POA: Diagnosis not present

## 2019-01-30 DIAGNOSIS — M48062 Spinal stenosis, lumbar region with neurogenic claudication: Secondary | ICD-10-CM | POA: Diagnosis not present

## 2019-02-03 DIAGNOSIS — G894 Chronic pain syndrome: Secondary | ICD-10-CM | POA: Diagnosis not present

## 2019-02-03 DIAGNOSIS — M48062 Spinal stenosis, lumbar region with neurogenic claudication: Secondary | ICD-10-CM | POA: Diagnosis not present

## 2019-02-03 DIAGNOSIS — M961 Postlaminectomy syndrome, not elsewhere classified: Secondary | ICD-10-CM | POA: Diagnosis not present

## 2019-02-03 DIAGNOSIS — I130 Hypertensive heart and chronic kidney disease with heart failure and stage 1 through stage 4 chronic kidney disease, or unspecified chronic kidney disease: Secondary | ICD-10-CM | POA: Diagnosis not present

## 2019-02-03 DIAGNOSIS — N183 Chronic kidney disease, stage 3 (moderate): Secondary | ICD-10-CM | POA: Diagnosis not present

## 2019-02-03 DIAGNOSIS — I5032 Chronic diastolic (congestive) heart failure: Secondary | ICD-10-CM | POA: Diagnosis not present

## 2019-02-06 ENCOUNTER — Telehealth: Payer: Self-pay | Admitting: *Deleted

## 2019-02-06 ENCOUNTER — Telehealth: Payer: Self-pay | Admitting: Rheumatology

## 2019-02-06 ENCOUNTER — Telehealth (INDEPENDENT_AMBULATORY_CARE_PROVIDER_SITE_OTHER): Payer: Medicare Other | Admitting: Rheumatology

## 2019-02-06 ENCOUNTER — Encounter: Payer: Self-pay | Admitting: Rheumatology

## 2019-02-06 DIAGNOSIS — Z7952 Long term (current) use of systemic steroids: Secondary | ICD-10-CM

## 2019-02-06 DIAGNOSIS — R768 Other specified abnormal immunological findings in serum: Secondary | ICD-10-CM

## 2019-02-06 DIAGNOSIS — Z8679 Personal history of other diseases of the circulatory system: Secondary | ICD-10-CM

## 2019-02-06 DIAGNOSIS — E559 Vitamin D deficiency, unspecified: Secondary | ICD-10-CM

## 2019-02-06 DIAGNOSIS — I5032 Chronic diastolic (congestive) heart failure: Secondary | ICD-10-CM | POA: Diagnosis not present

## 2019-02-06 DIAGNOSIS — M5136 Other intervertebral disc degeneration, lumbar region: Secondary | ICD-10-CM

## 2019-02-06 DIAGNOSIS — G894 Chronic pain syndrome: Secondary | ICD-10-CM | POA: Diagnosis not present

## 2019-02-06 DIAGNOSIS — M48062 Spinal stenosis, lumbar region with neurogenic claudication: Secondary | ICD-10-CM | POA: Diagnosis not present

## 2019-02-06 DIAGNOSIS — Z79899 Other long term (current) drug therapy: Secondary | ICD-10-CM

## 2019-02-06 DIAGNOSIS — M170 Bilateral primary osteoarthritis of knee: Secondary | ICD-10-CM

## 2019-02-06 DIAGNOSIS — M316 Other giant cell arteritis: Secondary | ICD-10-CM

## 2019-02-06 DIAGNOSIS — M353 Polymyalgia rheumatica: Secondary | ICD-10-CM | POA: Diagnosis not present

## 2019-02-06 DIAGNOSIS — Z8719 Personal history of other diseases of the digestive system: Secondary | ICD-10-CM

## 2019-02-06 DIAGNOSIS — M961 Postlaminectomy syndrome, not elsewhere classified: Secondary | ICD-10-CM | POA: Diagnosis not present

## 2019-02-06 DIAGNOSIS — I130 Hypertensive heart and chronic kidney disease with heart failure and stage 1 through stage 4 chronic kidney disease, or unspecified chronic kidney disease: Secondary | ICD-10-CM | POA: Diagnosis not present

## 2019-02-06 DIAGNOSIS — N183 Chronic kidney disease, stage 3 (moderate): Secondary | ICD-10-CM | POA: Diagnosis not present

## 2019-02-06 DIAGNOSIS — R413 Other amnesia: Secondary | ICD-10-CM

## 2019-02-06 DIAGNOSIS — M4804 Spinal stenosis, thoracic region: Secondary | ICD-10-CM

## 2019-02-06 MED ORDER — PREDNISONE 5 MG PO TABS: 10.0000 mg | ORAL_TABLET | Freq: Every day | ORAL | 0 refills | Status: DC

## 2019-02-06 NOTE — Telephone Encounter (Signed)
Patient states she has [facility] come to her home, and they draw her blood. Patient request orders for labs to be sent to them. [facility] Phone# [phone].

## 2019-02-06 NOTE — Telephone Encounter (Signed)
Patient had a telemedicine visit 02/06/19. What labs does she need to have drawn? Please advise.

## 2019-02-06 NOTE — Telephone Encounter (Signed)
Prednisone taper sent to the pharmacy

## 2019-02-07 NOTE — Telephone Encounter (Signed)
Spoke with [name] and advised him that the patient will not be on a maintenance dose of prednisone. Wilhemena Durie that patient will be tapering Prednisone by 1 mg every month. Patient's son is requesting to continue her physical therapy. Please advise if we can send orders to continue physical therapy.

## 2019-02-07 NOTE — Telephone Encounter (Signed)
Sent lab orders and physical therapy orders to [facility].

## 2019-02-07 NOTE — Telephone Encounter (Signed)
Please place orders for: Sed rate, CBC w/ diff, and CMP w/ GFR

## 2019-02-07 NOTE — Telephone Encounter (Signed)
[phone] son [name] called and left message requesting a return call. Attempted to return call and left message for him to call the office.

## 2019-02-07 NOTE — Telephone Encounter (Signed)
Ok to send orders to continue physical therapy

## 2019-02-07 NOTE — Telephone Encounter (Signed)
Patient's son, Nicole [name] returning your call. Per son, patient is concerned with returning to 10mg  Prednisone as a maintenance dose. Medication causes patient to gain weight, increased appetite, etc. Wanted to know if 5mg  would be enough to maintain? Also, patient discussed new orders to be sent to [facility] for physical therapy. Son would like to get a copy of those orders emailed if possible to him since [place]. He does get questions from [facility] at times, and having orders in hand would be of benefit to him, and patient. Sons email: [email or web address].   Lastly, son request lab orders to be sent to [facility] for draw to be done in home for patient.

## 2019-02-08 DIAGNOSIS — M961 Postlaminectomy syndrome, not elsewhere classified: Secondary | ICD-10-CM | POA: Diagnosis not present

## 2019-02-08 DIAGNOSIS — I5032 Chronic diastolic (congestive) heart failure: Secondary | ICD-10-CM | POA: Diagnosis not present

## 2019-02-08 DIAGNOSIS — G894 Chronic pain syndrome: Secondary | ICD-10-CM | POA: Diagnosis not present

## 2019-02-08 DIAGNOSIS — I130 Hypertensive heart and chronic kidney disease with heart failure and stage 1 through stage 4 chronic kidney disease, or unspecified chronic kidney disease: Secondary | ICD-10-CM | POA: Diagnosis not present

## 2019-02-08 DIAGNOSIS — M48062 Spinal stenosis, lumbar region with neurogenic claudication: Secondary | ICD-10-CM | POA: Diagnosis not present

## 2019-02-08 DIAGNOSIS — N183 Chronic kidney disease, stage 3 (moderate): Secondary | ICD-10-CM | POA: Diagnosis not present

## 2019-02-10 ENCOUNTER — Telehealth: Payer: Self-pay | Admitting: Rheumatology

## 2019-02-10 DIAGNOSIS — M48062 Spinal stenosis, lumbar region with neurogenic claudication: Secondary | ICD-10-CM | POA: Diagnosis not present

## 2019-02-10 DIAGNOSIS — I5032 Chronic diastolic (congestive) heart failure: Secondary | ICD-10-CM | POA: Diagnosis not present

## 2019-02-10 DIAGNOSIS — N183 Chronic kidney disease, stage 3 (moderate): Secondary | ICD-10-CM | POA: Diagnosis not present

## 2019-02-10 DIAGNOSIS — I130 Hypertensive heart and chronic kidney disease with heart failure and stage 1 through stage 4 chronic kidney disease, or unspecified chronic kidney disease: Secondary | ICD-10-CM | POA: Diagnosis not present

## 2019-02-10 DIAGNOSIS — M961 Postlaminectomy syndrome, not elsewhere classified: Secondary | ICD-10-CM | POA: Diagnosis not present

## 2019-02-10 DIAGNOSIS — G894 Chronic pain syndrome: Secondary | ICD-10-CM | POA: Diagnosis not present

## 2019-02-10 NOTE — Telephone Encounter (Signed)
FYI: [facility] calling to let us know labs ordered on patient were all done on March 20,2020 by them. They will fax over results.

## 2019-02-10 NOTE — Telephone Encounter (Signed)
Noted  

## 2019-02-11 ENCOUNTER — Telehealth: Payer: Self-pay

## 2019-02-11 DIAGNOSIS — I5032 Chronic diastolic (congestive) heart failure: Secondary | ICD-10-CM | POA: Diagnosis not present

## 2019-02-11 DIAGNOSIS — G894 Chronic pain syndrome: Secondary | ICD-10-CM | POA: Diagnosis not present

## 2019-02-11 DIAGNOSIS — I130 Hypertensive heart and chronic kidney disease with heart failure and stage 1 through stage 4 chronic kidney disease, or unspecified chronic kidney disease: Secondary | ICD-10-CM | POA: Diagnosis not present

## 2019-02-11 DIAGNOSIS — M961 Postlaminectomy syndrome, not elsewhere classified: Secondary | ICD-10-CM | POA: Diagnosis not present

## 2019-02-11 DIAGNOSIS — M48062 Spinal stenosis, lumbar region with neurogenic claudication: Secondary | ICD-10-CM | POA: Diagnosis not present

## 2019-02-11 DIAGNOSIS — N183 Chronic kidney disease, stage 3 (moderate): Secondary | ICD-10-CM | POA: Diagnosis not present

## 2019-02-11 NOTE — Telephone Encounter (Signed)
Labs received via fax.  01/10/2019 collection date.  Glucose 60 Creat 1.28 GFR 48 Alk phos 123 Sed rate 23  Labs have been reviewed by Dr. Estanislado Pandy. Labs will be sent to the scan center.

## 2019-02-13 DIAGNOSIS — Z6841 Body Mass Index (BMI) 40.0 and over, adult: Secondary | ICD-10-CM | POA: Diagnosis not present

## 2019-02-13 DIAGNOSIS — D631 Anemia in chronic kidney disease: Secondary | ICD-10-CM | POA: Diagnosis not present

## 2019-02-13 DIAGNOSIS — R262 Difficulty in walking, not elsewhere classified: Secondary | ICD-10-CM | POA: Diagnosis not present

## 2019-02-13 DIAGNOSIS — M353 Polymyalgia rheumatica: Secondary | ICD-10-CM | POA: Diagnosis not present

## 2019-02-13 DIAGNOSIS — M961 Postlaminectomy syndrome, not elsewhere classified: Secondary | ICD-10-CM | POA: Diagnosis not present

## 2019-02-13 DIAGNOSIS — N183 Chronic kidney disease, stage 3 (moderate): Secondary | ICD-10-CM | POA: Diagnosis not present

## 2019-02-13 DIAGNOSIS — M48062 Spinal stenosis, lumbar region with neurogenic claudication: Secondary | ICD-10-CM | POA: Diagnosis not present

## 2019-02-13 DIAGNOSIS — G894 Chronic pain syndrome: Secondary | ICD-10-CM | POA: Diagnosis not present

## 2019-02-13 DIAGNOSIS — I130 Hypertensive heart and chronic kidney disease with heart failure and stage 1 through stage 4 chronic kidney disease, or unspecified chronic kidney disease: Secondary | ICD-10-CM | POA: Diagnosis not present

## 2019-02-13 DIAGNOSIS — I5032 Chronic diastolic (congestive) heart failure: Secondary | ICD-10-CM | POA: Diagnosis not present

## 2019-02-17 DIAGNOSIS — M48062 Spinal stenosis, lumbar region with neurogenic claudication: Secondary | ICD-10-CM | POA: Diagnosis not present

## 2019-02-17 DIAGNOSIS — N183 Chronic kidney disease, stage 3 (moderate): Secondary | ICD-10-CM | POA: Diagnosis not present

## 2019-02-17 DIAGNOSIS — I130 Hypertensive heart and chronic kidney disease with heart failure and stage 1 through stage 4 chronic kidney disease, or unspecified chronic kidney disease: Secondary | ICD-10-CM | POA: Diagnosis not present

## 2019-02-17 DIAGNOSIS — G894 Chronic pain syndrome: Secondary | ICD-10-CM | POA: Diagnosis not present

## 2019-02-17 DIAGNOSIS — I5032 Chronic diastolic (congestive) heart failure: Secondary | ICD-10-CM | POA: Diagnosis not present

## 2019-02-17 DIAGNOSIS — M961 Postlaminectomy syndrome, not elsewhere classified: Secondary | ICD-10-CM | POA: Diagnosis not present

## 2019-02-20 DIAGNOSIS — N183 Chronic kidney disease, stage 3 (moderate): Secondary | ICD-10-CM | POA: Diagnosis not present

## 2019-02-20 DIAGNOSIS — I130 Hypertensive heart and chronic kidney disease with heart failure and stage 1 through stage 4 chronic kidney disease, or unspecified chronic kidney disease: Secondary | ICD-10-CM | POA: Diagnosis not present

## 2019-02-20 DIAGNOSIS — M961 Postlaminectomy syndrome, not elsewhere classified: Secondary | ICD-10-CM | POA: Diagnosis not present

## 2019-02-20 DIAGNOSIS — I5032 Chronic diastolic (congestive) heart failure: Secondary | ICD-10-CM | POA: Diagnosis not present

## 2019-02-20 DIAGNOSIS — G894 Chronic pain syndrome: Secondary | ICD-10-CM | POA: Diagnosis not present

## 2019-02-20 DIAGNOSIS — M48062 Spinal stenosis, lumbar region with neurogenic claudication: Secondary | ICD-10-CM | POA: Diagnosis not present

## 2019-02-24 DIAGNOSIS — I130 Hypertensive heart and chronic kidney disease with heart failure and stage 1 through stage 4 chronic kidney disease, or unspecified chronic kidney disease: Secondary | ICD-10-CM | POA: Diagnosis not present

## 2019-02-24 DIAGNOSIS — I5032 Chronic diastolic (congestive) heart failure: Secondary | ICD-10-CM | POA: Diagnosis not present

## 2019-02-24 DIAGNOSIS — M48062 Spinal stenosis, lumbar region with neurogenic claudication: Secondary | ICD-10-CM | POA: Diagnosis not present

## 2019-02-24 DIAGNOSIS — N183 Chronic kidney disease, stage 3 (moderate): Secondary | ICD-10-CM | POA: Diagnosis not present

## 2019-02-24 DIAGNOSIS — M961 Postlaminectomy syndrome, not elsewhere classified: Secondary | ICD-10-CM | POA: Diagnosis not present

## 2019-02-24 DIAGNOSIS — G894 Chronic pain syndrome: Secondary | ICD-10-CM | POA: Diagnosis not present

## 2019-02-26 DIAGNOSIS — M48062 Spinal stenosis, lumbar region with neurogenic claudication: Secondary | ICD-10-CM | POA: Diagnosis not present

## 2019-02-26 DIAGNOSIS — G894 Chronic pain syndrome: Secondary | ICD-10-CM | POA: Diagnosis not present

## 2019-02-26 DIAGNOSIS — I130 Hypertensive heart and chronic kidney disease with heart failure and stage 1 through stage 4 chronic kidney disease, or unspecified chronic kidney disease: Secondary | ICD-10-CM | POA: Diagnosis not present

## 2019-02-26 DIAGNOSIS — M961 Postlaminectomy syndrome, not elsewhere classified: Secondary | ICD-10-CM | POA: Diagnosis not present

## 2019-02-26 DIAGNOSIS — N183 Chronic kidney disease, stage 3 (moderate): Secondary | ICD-10-CM | POA: Diagnosis not present

## 2019-02-26 DIAGNOSIS — I5032 Chronic diastolic (congestive) heart failure: Secondary | ICD-10-CM | POA: Diagnosis not present

## 2019-02-28 DIAGNOSIS — G894 Chronic pain syndrome: Secondary | ICD-10-CM | POA: Diagnosis not present

## 2019-02-28 DIAGNOSIS — M48062 Spinal stenosis, lumbar region with neurogenic claudication: Secondary | ICD-10-CM | POA: Diagnosis not present

## 2019-02-28 DIAGNOSIS — M961 Postlaminectomy syndrome, not elsewhere classified: Secondary | ICD-10-CM | POA: Diagnosis not present

## 2019-02-28 DIAGNOSIS — I130 Hypertensive heart and chronic kidney disease with heart failure and stage 1 through stage 4 chronic kidney disease, or unspecified chronic kidney disease: Secondary | ICD-10-CM | POA: Diagnosis not present

## 2019-03-03 DIAGNOSIS — M48062 Spinal stenosis, lumbar region with neurogenic claudication: Secondary | ICD-10-CM | POA: Diagnosis not present

## 2019-03-03 DIAGNOSIS — G894 Chronic pain syndrome: Secondary | ICD-10-CM | POA: Diagnosis not present

## 2019-03-03 DIAGNOSIS — I130 Hypertensive heart and chronic kidney disease with heart failure and stage 1 through stage 4 chronic kidney disease, or unspecified chronic kidney disease: Secondary | ICD-10-CM | POA: Diagnosis not present

## 2019-03-03 DIAGNOSIS — N183 Chronic kidney disease, stage 3 (moderate): Secondary | ICD-10-CM | POA: Diagnosis not present

## 2019-03-03 DIAGNOSIS — M961 Postlaminectomy syndrome, not elsewhere classified: Secondary | ICD-10-CM | POA: Diagnosis not present

## 2019-03-03 DIAGNOSIS — I5032 Chronic diastolic (congestive) heart failure: Secondary | ICD-10-CM | POA: Diagnosis not present

## 2019-03-05 DIAGNOSIS — N183 Chronic kidney disease, stage 3 (moderate): Secondary | ICD-10-CM | POA: Diagnosis not present

## 2019-03-05 DIAGNOSIS — G894 Chronic pain syndrome: Secondary | ICD-10-CM | POA: Diagnosis not present

## 2019-03-05 DIAGNOSIS — I5032 Chronic diastolic (congestive) heart failure: Secondary | ICD-10-CM | POA: Diagnosis not present

## 2019-03-05 DIAGNOSIS — M961 Postlaminectomy syndrome, not elsewhere classified: Secondary | ICD-10-CM | POA: Diagnosis not present

## 2019-03-05 DIAGNOSIS — M48062 Spinal stenosis, lumbar region with neurogenic claudication: Secondary | ICD-10-CM | POA: Diagnosis not present

## 2019-03-05 DIAGNOSIS — I130 Hypertensive heart and chronic kidney disease with heart failure and stage 1 through stage 4 chronic kidney disease, or unspecified chronic kidney disease: Secondary | ICD-10-CM | POA: Diagnosis not present

## 2019-03-10 ENCOUNTER — Other Ambulatory Visit: Payer: Self-pay | Admitting: Rheumatology

## 2019-03-10 DIAGNOSIS — M48062 Spinal stenosis, lumbar region with neurogenic claudication: Secondary | ICD-10-CM | POA: Diagnosis not present

## 2019-03-10 DIAGNOSIS — N183 Chronic kidney disease, stage 3 (moderate): Secondary | ICD-10-CM | POA: Diagnosis not present

## 2019-03-10 DIAGNOSIS — M961 Postlaminectomy syndrome, not elsewhere classified: Secondary | ICD-10-CM | POA: Diagnosis not present

## 2019-03-10 DIAGNOSIS — G894 Chronic pain syndrome: Secondary | ICD-10-CM | POA: Diagnosis not present

## 2019-03-10 DIAGNOSIS — I5032 Chronic diastolic (congestive) heart failure: Secondary | ICD-10-CM | POA: Diagnosis not present

## 2019-03-10 DIAGNOSIS — I130 Hypertensive heart and chronic kidney disease with heart failure and stage 1 through stage 4 chronic kidney disease, or unspecified chronic kidney disease: Secondary | ICD-10-CM | POA: Diagnosis not present

## 2019-03-10 MED ORDER — PREDNISONE 1 MG PO TABS: 4.0000 mg | ORAL_TABLET | Freq: Every day | ORAL | 0 refills | Status: DC

## 2019-03-10 NOTE — Telephone Encounter (Signed)
Last Visit: 02/06/19 Next Visit: 07/11/19  Patient is tapering to 9 mg of Prednisone this month.   Okay to refill per Dr. Estanislado Pandy

## 2019-03-13 DIAGNOSIS — Z0001 Encounter for general adult medical examination with abnormal findings: Secondary | ICD-10-CM | POA: Diagnosis not present

## 2019-03-13 DIAGNOSIS — Z681 Body mass index (BMI) 19 or less, adult: Secondary | ICD-10-CM | POA: Diagnosis not present

## 2019-03-13 DIAGNOSIS — Z1389 Encounter for screening for other disorder: Secondary | ICD-10-CM | POA: Diagnosis not present

## 2019-03-13 DIAGNOSIS — I10 Essential (primary) hypertension: Secondary | ICD-10-CM | POA: Diagnosis not present

## 2019-03-13 DIAGNOSIS — I509 Heart failure, unspecified: Secondary | ICD-10-CM | POA: Diagnosis not present

## 2019-03-14 DIAGNOSIS — I5032 Chronic diastolic (congestive) heart failure: Secondary | ICD-10-CM | POA: Diagnosis not present

## 2019-03-14 DIAGNOSIS — M961 Postlaminectomy syndrome, not elsewhere classified: Secondary | ICD-10-CM | POA: Diagnosis not present

## 2019-03-14 DIAGNOSIS — G894 Chronic pain syndrome: Secondary | ICD-10-CM | POA: Diagnosis not present

## 2019-03-14 DIAGNOSIS — I130 Hypertensive heart and chronic kidney disease with heart failure and stage 1 through stage 4 chronic kidney disease, or unspecified chronic kidney disease: Secondary | ICD-10-CM | POA: Diagnosis not present

## 2019-03-14 DIAGNOSIS — N183 Chronic kidney disease, stage 3 (moderate): Secondary | ICD-10-CM | POA: Diagnosis not present

## 2019-03-14 DIAGNOSIS — M48062 Spinal stenosis, lumbar region with neurogenic claudication: Secondary | ICD-10-CM | POA: Diagnosis not present

## 2019-03-15 DIAGNOSIS — M353 Polymyalgia rheumatica: Secondary | ICD-10-CM | POA: Diagnosis not present

## 2019-03-15 DIAGNOSIS — Z6841 Body Mass Index (BMI) 40.0 and over, adult: Secondary | ICD-10-CM | POA: Diagnosis not present

## 2019-03-15 DIAGNOSIS — G894 Chronic pain syndrome: Secondary | ICD-10-CM | POA: Diagnosis not present

## 2019-03-15 DIAGNOSIS — M48062 Spinal stenosis, lumbar region with neurogenic claudication: Secondary | ICD-10-CM | POA: Diagnosis not present

## 2019-03-15 DIAGNOSIS — I130 Hypertensive heart and chronic kidney disease with heart failure and stage 1 through stage 4 chronic kidney disease, or unspecified chronic kidney disease: Secondary | ICD-10-CM | POA: Diagnosis not present

## 2019-03-15 DIAGNOSIS — I5032 Chronic diastolic (congestive) heart failure: Secondary | ICD-10-CM | POA: Diagnosis not present

## 2019-03-15 DIAGNOSIS — D631 Anemia in chronic kidney disease: Secondary | ICD-10-CM | POA: Diagnosis not present

## 2019-03-15 DIAGNOSIS — N183 Chronic kidney disease, stage 3 (moderate): Secondary | ICD-10-CM | POA: Diagnosis not present

## 2019-03-15 DIAGNOSIS — M961 Postlaminectomy syndrome, not elsewhere classified: Secondary | ICD-10-CM | POA: Diagnosis not present

## 2019-03-15 DIAGNOSIS — R262 Difficulty in walking, not elsewhere classified: Secondary | ICD-10-CM | POA: Diagnosis not present

## 2019-03-19 DIAGNOSIS — M961 Postlaminectomy syndrome, not elsewhere classified: Secondary | ICD-10-CM | POA: Diagnosis not present

## 2019-03-19 DIAGNOSIS — G894 Chronic pain syndrome: Secondary | ICD-10-CM | POA: Diagnosis not present

## 2019-03-19 DIAGNOSIS — I130 Hypertensive heart and chronic kidney disease with heart failure and stage 1 through stage 4 chronic kidney disease, or unspecified chronic kidney disease: Secondary | ICD-10-CM | POA: Diagnosis not present

## 2019-03-19 DIAGNOSIS — M48062 Spinal stenosis, lumbar region with neurogenic claudication: Secondary | ICD-10-CM | POA: Diagnosis not present

## 2019-03-19 DIAGNOSIS — N183 Chronic kidney disease, stage 3 (moderate): Secondary | ICD-10-CM | POA: Diagnosis not present

## 2019-03-19 DIAGNOSIS — I5032 Chronic diastolic (congestive) heart failure: Secondary | ICD-10-CM | POA: Diagnosis not present

## 2019-03-21 DIAGNOSIS — G894 Chronic pain syndrome: Secondary | ICD-10-CM | POA: Diagnosis not present

## 2019-03-21 DIAGNOSIS — M79672 Pain in left foot: Secondary | ICD-10-CM | POA: Diagnosis not present

## 2019-03-21 DIAGNOSIS — M961 Postlaminectomy syndrome, not elsewhere classified: Secondary | ICD-10-CM | POA: Diagnosis not present

## 2019-03-21 DIAGNOSIS — M48062 Spinal stenosis, lumbar region with neurogenic claudication: Secondary | ICD-10-CM | POA: Diagnosis not present

## 2019-03-21 DIAGNOSIS — I5032 Chronic diastolic (congestive) heart failure: Secondary | ICD-10-CM | POA: Diagnosis not present

## 2019-03-21 DIAGNOSIS — M25572 Pain in left ankle and joints of left foot: Secondary | ICD-10-CM | POA: Diagnosis not present

## 2019-03-21 DIAGNOSIS — N183 Chronic kidney disease, stage 3 (moderate): Secondary | ICD-10-CM | POA: Diagnosis not present

## 2019-03-21 DIAGNOSIS — I130 Hypertensive heart and chronic kidney disease with heart failure and stage 1 through stage 4 chronic kidney disease, or unspecified chronic kidney disease: Secondary | ICD-10-CM | POA: Diagnosis not present

## 2019-03-24 DIAGNOSIS — N183 Chronic kidney disease, stage 3 (moderate): Secondary | ICD-10-CM | POA: Diagnosis not present

## 2019-03-24 DIAGNOSIS — I5032 Chronic diastolic (congestive) heart failure: Secondary | ICD-10-CM | POA: Diagnosis not present

## 2019-03-24 DIAGNOSIS — G894 Chronic pain syndrome: Secondary | ICD-10-CM | POA: Diagnosis not present

## 2019-03-24 DIAGNOSIS — M48062 Spinal stenosis, lumbar region with neurogenic claudication: Secondary | ICD-10-CM | POA: Diagnosis not present

## 2019-03-24 DIAGNOSIS — I130 Hypertensive heart and chronic kidney disease with heart failure and stage 1 through stage 4 chronic kidney disease, or unspecified chronic kidney disease: Secondary | ICD-10-CM | POA: Diagnosis not present

## 2019-03-24 DIAGNOSIS — M961 Postlaminectomy syndrome, not elsewhere classified: Secondary | ICD-10-CM | POA: Diagnosis not present

## 2019-03-26 DIAGNOSIS — M48062 Spinal stenosis, lumbar region with neurogenic claudication: Secondary | ICD-10-CM | POA: Diagnosis not present

## 2019-03-26 DIAGNOSIS — I5032 Chronic diastolic (congestive) heart failure: Secondary | ICD-10-CM | POA: Diagnosis not present

## 2019-03-26 DIAGNOSIS — I130 Hypertensive heart and chronic kidney disease with heart failure and stage 1 through stage 4 chronic kidney disease, or unspecified chronic kidney disease: Secondary | ICD-10-CM | POA: Diagnosis not present

## 2019-03-26 DIAGNOSIS — N183 Chronic kidney disease, stage 3 (moderate): Secondary | ICD-10-CM | POA: Diagnosis not present

## 2019-03-26 DIAGNOSIS — M961 Postlaminectomy syndrome, not elsewhere classified: Secondary | ICD-10-CM | POA: Diagnosis not present

## 2019-03-26 DIAGNOSIS — G894 Chronic pain syndrome: Secondary | ICD-10-CM | POA: Diagnosis not present

## 2019-04-01 DIAGNOSIS — I130 Hypertensive heart and chronic kidney disease with heart failure and stage 1 through stage 4 chronic kidney disease, or unspecified chronic kidney disease: Secondary | ICD-10-CM | POA: Diagnosis not present

## 2019-04-01 DIAGNOSIS — I5032 Chronic diastolic (congestive) heart failure: Secondary | ICD-10-CM | POA: Diagnosis not present

## 2019-04-01 DIAGNOSIS — N183 Chronic kidney disease, stage 3 (moderate): Secondary | ICD-10-CM | POA: Diagnosis not present

## 2019-04-01 DIAGNOSIS — M48062 Spinal stenosis, lumbar region with neurogenic claudication: Secondary | ICD-10-CM | POA: Diagnosis not present

## 2019-04-01 DIAGNOSIS — M961 Postlaminectomy syndrome, not elsewhere classified: Secondary | ICD-10-CM | POA: Diagnosis not present

## 2019-04-01 DIAGNOSIS — G894 Chronic pain syndrome: Secondary | ICD-10-CM | POA: Diagnosis not present

## 2019-04-02 ENCOUNTER — Other Ambulatory Visit: Payer: Self-pay | Admitting: Rheumatology

## 2019-04-02 NOTE — Telephone Encounter (Signed)
Last Visit: 02/06/19 Next Visit: 07/11/19  Spoke with patient and she will be tapering to 8 mg for 1 month.

## 2019-04-03 DIAGNOSIS — I130 Hypertensive heart and chronic kidney disease with heart failure and stage 1 through stage 4 chronic kidney disease, or unspecified chronic kidney disease: Secondary | ICD-10-CM | POA: Diagnosis not present

## 2019-04-03 DIAGNOSIS — G894 Chronic pain syndrome: Secondary | ICD-10-CM | POA: Diagnosis not present

## 2019-04-03 DIAGNOSIS — M961 Postlaminectomy syndrome, not elsewhere classified: Secondary | ICD-10-CM | POA: Diagnosis not present

## 2019-04-03 DIAGNOSIS — M48062 Spinal stenosis, lumbar region with neurogenic claudication: Secondary | ICD-10-CM | POA: Diagnosis not present

## 2019-04-03 DIAGNOSIS — I5032 Chronic diastolic (congestive) heart failure: Secondary | ICD-10-CM | POA: Diagnosis not present

## 2019-04-03 DIAGNOSIS — N183 Chronic kidney disease, stage 3 (moderate): Secondary | ICD-10-CM | POA: Diagnosis not present

## 2019-04-08 DIAGNOSIS — I5032 Chronic diastolic (congestive) heart failure: Secondary | ICD-10-CM | POA: Diagnosis not present

## 2019-04-08 DIAGNOSIS — I130 Hypertensive heart and chronic kidney disease with heart failure and stage 1 through stage 4 chronic kidney disease, or unspecified chronic kidney disease: Secondary | ICD-10-CM | POA: Diagnosis not present

## 2019-04-08 DIAGNOSIS — G894 Chronic pain syndrome: Secondary | ICD-10-CM | POA: Diagnosis not present

## 2019-04-08 DIAGNOSIS — M961 Postlaminectomy syndrome, not elsewhere classified: Secondary | ICD-10-CM | POA: Diagnosis not present

## 2019-04-08 DIAGNOSIS — N183 Chronic kidney disease, stage 3 (moderate): Secondary | ICD-10-CM | POA: Diagnosis not present

## 2019-04-08 DIAGNOSIS — M48062 Spinal stenosis, lumbar region with neurogenic claudication: Secondary | ICD-10-CM | POA: Diagnosis not present

## 2019-04-11 DIAGNOSIS — M961 Postlaminectomy syndrome, not elsewhere classified: Secondary | ICD-10-CM | POA: Diagnosis not present

## 2019-04-11 DIAGNOSIS — I5032 Chronic diastolic (congestive) heart failure: Secondary | ICD-10-CM | POA: Diagnosis not present

## 2019-04-11 DIAGNOSIS — N183 Chronic kidney disease, stage 3 (moderate): Secondary | ICD-10-CM | POA: Diagnosis not present

## 2019-04-11 DIAGNOSIS — I130 Hypertensive heart and chronic kidney disease with heart failure and stage 1 through stage 4 chronic kidney disease, or unspecified chronic kidney disease: Secondary | ICD-10-CM | POA: Diagnosis not present

## 2019-04-11 DIAGNOSIS — M48062 Spinal stenosis, lumbar region with neurogenic claudication: Secondary | ICD-10-CM | POA: Diagnosis not present

## 2019-04-11 DIAGNOSIS — G894 Chronic pain syndrome: Secondary | ICD-10-CM | POA: Diagnosis not present

## 2019-04-14 DIAGNOSIS — Z6841 Body Mass Index (BMI) 40.0 and over, adult: Secondary | ICD-10-CM | POA: Diagnosis not present

## 2019-04-14 DIAGNOSIS — M48062 Spinal stenosis, lumbar region with neurogenic claudication: Secondary | ICD-10-CM | POA: Diagnosis not present

## 2019-04-14 DIAGNOSIS — I5022 Chronic systolic (congestive) heart failure: Secondary | ICD-10-CM | POA: Diagnosis not present

## 2019-04-14 DIAGNOSIS — M353 Polymyalgia rheumatica: Secondary | ICD-10-CM | POA: Diagnosis not present

## 2019-04-14 DIAGNOSIS — D631 Anemia in chronic kidney disease: Secondary | ICD-10-CM | POA: Diagnosis not present

## 2019-04-14 DIAGNOSIS — N183 Chronic kidney disease, stage 3 (moderate): Secondary | ICD-10-CM | POA: Diagnosis not present

## 2019-04-14 DIAGNOSIS — M1990 Unspecified osteoarthritis, unspecified site: Secondary | ICD-10-CM | POA: Diagnosis not present

## 2019-04-14 DIAGNOSIS — G5750 Tarsal tunnel syndrome, unspecified lower limb: Secondary | ICD-10-CM | POA: Diagnosis not present

## 2019-04-14 DIAGNOSIS — G894 Chronic pain syndrome: Secondary | ICD-10-CM | POA: Diagnosis not present

## 2019-04-14 DIAGNOSIS — I130 Hypertensive heart and chronic kidney disease with heart failure and stage 1 through stage 4 chronic kidney disease, or unspecified chronic kidney disease: Secondary | ICD-10-CM | POA: Diagnosis not present

## 2019-04-14 DIAGNOSIS — I5032 Chronic diastolic (congestive) heart failure: Secondary | ICD-10-CM | POA: Diagnosis not present

## 2019-04-14 DIAGNOSIS — M79672 Pain in left foot: Secondary | ICD-10-CM | POA: Diagnosis not present

## 2019-04-14 DIAGNOSIS — M961 Postlaminectomy syndrome, not elsewhere classified: Secondary | ICD-10-CM | POA: Diagnosis not present

## 2019-04-14 DIAGNOSIS — R262 Difficulty in walking, not elsewhere classified: Secondary | ICD-10-CM | POA: Diagnosis not present

## 2019-04-16 DIAGNOSIS — G894 Chronic pain syndrome: Secondary | ICD-10-CM | POA: Diagnosis not present

## 2019-04-16 DIAGNOSIS — I5032 Chronic diastolic (congestive) heart failure: Secondary | ICD-10-CM | POA: Diagnosis not present

## 2019-04-16 DIAGNOSIS — M961 Postlaminectomy syndrome, not elsewhere classified: Secondary | ICD-10-CM | POA: Diagnosis not present

## 2019-04-16 DIAGNOSIS — M48062 Spinal stenosis, lumbar region with neurogenic claudication: Secondary | ICD-10-CM | POA: Diagnosis not present

## 2019-04-16 DIAGNOSIS — N183 Chronic kidney disease, stage 3 (moderate): Secondary | ICD-10-CM | POA: Diagnosis not present

## 2019-04-16 DIAGNOSIS — I130 Hypertensive heart and chronic kidney disease with heart failure and stage 1 through stage 4 chronic kidney disease, or unspecified chronic kidney disease: Secondary | ICD-10-CM | POA: Diagnosis not present

## 2019-04-26 DIAGNOSIS — G894 Chronic pain syndrome: Secondary | ICD-10-CM | POA: Diagnosis not present

## 2019-04-26 DIAGNOSIS — I130 Hypertensive heart and chronic kidney disease with heart failure and stage 1 through stage 4 chronic kidney disease, or unspecified chronic kidney disease: Secondary | ICD-10-CM | POA: Diagnosis not present

## 2019-04-26 DIAGNOSIS — M48062 Spinal stenosis, lumbar region with neurogenic claudication: Secondary | ICD-10-CM | POA: Diagnosis not present

## 2019-04-26 DIAGNOSIS — N183 Chronic kidney disease, stage 3 (moderate): Secondary | ICD-10-CM | POA: Diagnosis not present

## 2019-04-26 DIAGNOSIS — M961 Postlaminectomy syndrome, not elsewhere classified: Secondary | ICD-10-CM | POA: Diagnosis not present

## 2019-04-26 DIAGNOSIS — I5032 Chronic diastolic (congestive) heart failure: Secondary | ICD-10-CM | POA: Diagnosis not present

## 2019-04-28 DIAGNOSIS — M961 Postlaminectomy syndrome, not elsewhere classified: Secondary | ICD-10-CM | POA: Diagnosis not present

## 2019-04-28 DIAGNOSIS — I130 Hypertensive heart and chronic kidney disease with heart failure and stage 1 through stage 4 chronic kidney disease, or unspecified chronic kidney disease: Secondary | ICD-10-CM | POA: Diagnosis not present

## 2019-04-28 DIAGNOSIS — M48062 Spinal stenosis, lumbar region with neurogenic claudication: Secondary | ICD-10-CM | POA: Diagnosis not present

## 2019-04-28 DIAGNOSIS — G894 Chronic pain syndrome: Secondary | ICD-10-CM | POA: Diagnosis not present

## 2019-04-29 DIAGNOSIS — M48062 Spinal stenosis, lumbar region with neurogenic claudication: Secondary | ICD-10-CM | POA: Diagnosis not present

## 2019-04-29 DIAGNOSIS — I130 Hypertensive heart and chronic kidney disease with heart failure and stage 1 through stage 4 chronic kidney disease, or unspecified chronic kidney disease: Secondary | ICD-10-CM | POA: Diagnosis not present

## 2019-04-29 DIAGNOSIS — M25572 Pain in left ankle and joints of left foot: Secondary | ICD-10-CM | POA: Diagnosis not present

## 2019-04-29 DIAGNOSIS — M961 Postlaminectomy syndrome, not elsewhere classified: Secondary | ICD-10-CM | POA: Diagnosis not present

## 2019-04-29 DIAGNOSIS — I5032 Chronic diastolic (congestive) heart failure: Secondary | ICD-10-CM | POA: Diagnosis not present

## 2019-04-29 DIAGNOSIS — G894 Chronic pain syndrome: Secondary | ICD-10-CM | POA: Diagnosis not present

## 2019-04-29 DIAGNOSIS — N183 Chronic kidney disease, stage 3 (moderate): Secondary | ICD-10-CM | POA: Diagnosis not present

## 2019-05-01 ENCOUNTER — Other Ambulatory Visit: Payer: Self-pay | Admitting: Family Medicine

## 2019-05-01 ENCOUNTER — Other Ambulatory Visit (HOSPITAL_COMMUNITY): Payer: Self-pay | Admitting: Family Medicine

## 2019-05-01 DIAGNOSIS — M25572 Pain in left ankle and joints of left foot: Secondary | ICD-10-CM

## 2019-05-05 DIAGNOSIS — G894 Chronic pain syndrome: Secondary | ICD-10-CM | POA: Diagnosis not present

## 2019-05-05 DIAGNOSIS — N183 Chronic kidney disease, stage 3 (moderate): Secondary | ICD-10-CM | POA: Diagnosis not present

## 2019-05-05 DIAGNOSIS — I5032 Chronic diastolic (congestive) heart failure: Secondary | ICD-10-CM | POA: Diagnosis not present

## 2019-05-05 DIAGNOSIS — I130 Hypertensive heart and chronic kidney disease with heart failure and stage 1 through stage 4 chronic kidney disease, or unspecified chronic kidney disease: Secondary | ICD-10-CM | POA: Diagnosis not present

## 2019-05-05 DIAGNOSIS — M961 Postlaminectomy syndrome, not elsewhere classified: Secondary | ICD-10-CM | POA: Diagnosis not present

## 2019-05-05 DIAGNOSIS — M48062 Spinal stenosis, lumbar region with neurogenic claudication: Secondary | ICD-10-CM | POA: Diagnosis not present

## 2019-05-06 ENCOUNTER — Other Ambulatory Visit: Payer: Self-pay | Admitting: Family Medicine

## 2019-05-06 DIAGNOSIS — M25572 Pain in left ankle and joints of left foot: Secondary | ICD-10-CM

## 2019-05-08 ENCOUNTER — Other Ambulatory Visit: Payer: Self-pay | Admitting: Rheumatology

## 2019-05-08 DIAGNOSIS — I130 Hypertensive heart and chronic kidney disease with heart failure and stage 1 through stage 4 chronic kidney disease, or unspecified chronic kidney disease: Secondary | ICD-10-CM | POA: Diagnosis not present

## 2019-05-08 DIAGNOSIS — Z9189 Other specified personal risk factors, not elsewhere classified: Secondary | ICD-10-CM | POA: Diagnosis not present

## 2019-05-08 DIAGNOSIS — M961 Postlaminectomy syndrome, not elsewhere classified: Secondary | ICD-10-CM | POA: Diagnosis not present

## 2019-05-08 DIAGNOSIS — N183 Chronic kidney disease, stage 3 (moderate): Secondary | ICD-10-CM | POA: Diagnosis not present

## 2019-05-08 DIAGNOSIS — M48062 Spinal stenosis, lumbar region with neurogenic claudication: Secondary | ICD-10-CM | POA: Diagnosis not present

## 2019-05-08 DIAGNOSIS — M159 Polyosteoarthritis, unspecified: Secondary | ICD-10-CM | POA: Diagnosis not present

## 2019-05-08 DIAGNOSIS — G894 Chronic pain syndrome: Secondary | ICD-10-CM | POA: Diagnosis not present

## 2019-05-08 DIAGNOSIS — I5032 Chronic diastolic (congestive) heart failure: Secondary | ICD-10-CM | POA: Diagnosis not present

## 2019-05-08 NOTE — Telephone Encounter (Signed)
Patient advised she may stay on the current dose until she gets the MRI report based on the report she may taper prednisone.

## 2019-05-08 NOTE — Telephone Encounter (Signed)
Last Visit: 02/06/19 Next Visit: 07/11/19  Patient states she has been having pain in her left foot and swelling. Patient states she is scheduled to have an MRI of her foot ordered by Dr. Collene Mares at [facility]. Patient is currently on Prednisone 8 mg and due to go to 7 mg on 05/31/19. Please advise if you would like patient to continue to taper to stay at this dose at this time.

## 2019-05-08 NOTE — Telephone Encounter (Signed)
She may stay on the current dose until she gets the MRI report based on the report she may taper prednisone.

## 2019-05-09 ENCOUNTER — Ambulatory Visit (HOSPITAL_COMMUNITY): Payer: TRICARE For Life (TFL)

## 2019-05-09 ENCOUNTER — Other Ambulatory Visit (HOSPITAL_COMMUNITY): Payer: TRICARE For Life (TFL)

## 2019-05-14 DIAGNOSIS — N183 Chronic kidney disease, stage 3 (moderate): Secondary | ICD-10-CM | POA: Diagnosis not present

## 2019-05-14 DIAGNOSIS — R262 Difficulty in walking, not elsewhere classified: Secondary | ICD-10-CM | POA: Diagnosis not present

## 2019-05-14 DIAGNOSIS — M48062 Spinal stenosis, lumbar region with neurogenic claudication: Secondary | ICD-10-CM | POA: Diagnosis not present

## 2019-05-14 DIAGNOSIS — I5032 Chronic diastolic (congestive) heart failure: Secondary | ICD-10-CM | POA: Diagnosis not present

## 2019-05-14 DIAGNOSIS — M961 Postlaminectomy syndrome, not elsewhere classified: Secondary | ICD-10-CM | POA: Diagnosis not present

## 2019-05-14 DIAGNOSIS — D631 Anemia in chronic kidney disease: Secondary | ICD-10-CM | POA: Diagnosis not present

## 2019-05-14 DIAGNOSIS — I130 Hypertensive heart and chronic kidney disease with heart failure and stage 1 through stage 4 chronic kidney disease, or unspecified chronic kidney disease: Secondary | ICD-10-CM | POA: Diagnosis not present

## 2019-05-14 DIAGNOSIS — Z6841 Body Mass Index (BMI) 40.0 and over, adult: Secondary | ICD-10-CM | POA: Diagnosis not present

## 2019-05-14 DIAGNOSIS — I5022 Chronic systolic (congestive) heart failure: Secondary | ICD-10-CM | POA: Diagnosis not present

## 2019-05-14 DIAGNOSIS — G894 Chronic pain syndrome: Secondary | ICD-10-CM | POA: Diagnosis not present

## 2019-05-14 DIAGNOSIS — M353 Polymyalgia rheumatica: Secondary | ICD-10-CM | POA: Diagnosis not present

## 2019-05-19 DIAGNOSIS — I130 Hypertensive heart and chronic kidney disease with heart failure and stage 1 through stage 4 chronic kidney disease, or unspecified chronic kidney disease: Secondary | ICD-10-CM | POA: Diagnosis not present

## 2019-05-19 DIAGNOSIS — N183 Chronic kidney disease, stage 3 (moderate): Secondary | ICD-10-CM | POA: Diagnosis not present

## 2019-05-19 DIAGNOSIS — M961 Postlaminectomy syndrome, not elsewhere classified: Secondary | ICD-10-CM | POA: Diagnosis not present

## 2019-05-19 DIAGNOSIS — M48062 Spinal stenosis, lumbar region with neurogenic claudication: Secondary | ICD-10-CM | POA: Diagnosis not present

## 2019-05-19 DIAGNOSIS — G894 Chronic pain syndrome: Secondary | ICD-10-CM | POA: Diagnosis not present

## 2019-05-19 DIAGNOSIS — I5032 Chronic diastolic (congestive) heart failure: Secondary | ICD-10-CM | POA: Diagnosis not present

## 2019-05-22 DIAGNOSIS — G894 Chronic pain syndrome: Secondary | ICD-10-CM | POA: Diagnosis not present

## 2019-05-22 DIAGNOSIS — I130 Hypertensive heart and chronic kidney disease with heart failure and stage 1 through stage 4 chronic kidney disease, or unspecified chronic kidney disease: Secondary | ICD-10-CM | POA: Diagnosis not present

## 2019-05-22 DIAGNOSIS — I5032 Chronic diastolic (congestive) heart failure: Secondary | ICD-10-CM | POA: Diagnosis not present

## 2019-05-22 DIAGNOSIS — M961 Postlaminectomy syndrome, not elsewhere classified: Secondary | ICD-10-CM | POA: Diagnosis not present

## 2019-05-22 DIAGNOSIS — N183 Chronic kidney disease, stage 3 (moderate): Secondary | ICD-10-CM | POA: Diagnosis not present

## 2019-05-22 DIAGNOSIS — M48062 Spinal stenosis, lumbar region with neurogenic claudication: Secondary | ICD-10-CM | POA: Diagnosis not present

## 2019-05-29 DIAGNOSIS — M48062 Spinal stenosis, lumbar region with neurogenic claudication: Secondary | ICD-10-CM | POA: Diagnosis not present

## 2019-05-29 DIAGNOSIS — M961 Postlaminectomy syndrome, not elsewhere classified: Secondary | ICD-10-CM | POA: Diagnosis not present

## 2019-05-29 DIAGNOSIS — I130 Hypertensive heart and chronic kidney disease with heart failure and stage 1 through stage 4 chronic kidney disease, or unspecified chronic kidney disease: Secondary | ICD-10-CM | POA: Diagnosis not present

## 2019-05-29 DIAGNOSIS — G894 Chronic pain syndrome: Secondary | ICD-10-CM | POA: Diagnosis not present

## 2019-05-29 DIAGNOSIS — I5032 Chronic diastolic (congestive) heart failure: Secondary | ICD-10-CM | POA: Diagnosis not present

## 2019-05-29 DIAGNOSIS — N183 Chronic kidney disease, stage 3 (moderate): Secondary | ICD-10-CM | POA: Diagnosis not present

## 2019-05-30 ENCOUNTER — Other Ambulatory Visit: Payer: TRICARE For Life (TFL)

## 2019-05-31 ENCOUNTER — Ambulatory Visit
Admission: RE | Admit: 2019-05-31 | Discharge: 2019-05-31 | Disposition: A | Discharge status: Home / Self Care | Payer: Medicare Other | Source: Ambulatory Visit | Attending: Family Medicine | Admitting: Family Medicine

## 2019-05-31 ENCOUNTER — Other Ambulatory Visit: Payer: Self-pay

## 2019-05-31 DIAGNOSIS — M19072 Primary osteoarthritis, left ankle and foot: Secondary | ICD-10-CM | POA: Diagnosis not present

## 2019-05-31 DIAGNOSIS — M25572 Pain in left ankle and joints of left foot: Secondary | ICD-10-CM

## 2019-05-31 DIAGNOSIS — S86312A Strain of muscle(s) and tendon(s) of peroneal muscle group at lower leg level, left leg, initial encounter: Secondary | ICD-10-CM | POA: Diagnosis not present

## 2019-06-03 DIAGNOSIS — M48062 Spinal stenosis, lumbar region with neurogenic claudication: Secondary | ICD-10-CM | POA: Diagnosis not present

## 2019-06-03 DIAGNOSIS — I5032 Chronic diastolic (congestive) heart failure: Secondary | ICD-10-CM | POA: Diagnosis not present

## 2019-06-03 DIAGNOSIS — M961 Postlaminectomy syndrome, not elsewhere classified: Secondary | ICD-10-CM | POA: Diagnosis not present

## 2019-06-03 DIAGNOSIS — I130 Hypertensive heart and chronic kidney disease with heart failure and stage 1 through stage 4 chronic kidney disease, or unspecified chronic kidney disease: Secondary | ICD-10-CM | POA: Diagnosis not present

## 2019-06-03 DIAGNOSIS — G894 Chronic pain syndrome: Secondary | ICD-10-CM | POA: Diagnosis not present

## 2019-06-03 DIAGNOSIS — N183 Chronic kidney disease, stage 3 (moderate): Secondary | ICD-10-CM | POA: Diagnosis not present

## 2019-06-06 DIAGNOSIS — I130 Hypertensive heart and chronic kidney disease with heart failure and stage 1 through stage 4 chronic kidney disease, or unspecified chronic kidney disease: Secondary | ICD-10-CM | POA: Diagnosis not present

## 2019-06-06 DIAGNOSIS — I5032 Chronic diastolic (congestive) heart failure: Secondary | ICD-10-CM | POA: Diagnosis not present

## 2019-06-06 DIAGNOSIS — N183 Chronic kidney disease, stage 3 (moderate): Secondary | ICD-10-CM | POA: Diagnosis not present

## 2019-06-06 DIAGNOSIS — M48062 Spinal stenosis, lumbar region with neurogenic claudication: Secondary | ICD-10-CM | POA: Diagnosis not present

## 2019-06-06 DIAGNOSIS — G894 Chronic pain syndrome: Secondary | ICD-10-CM | POA: Diagnosis not present

## 2019-06-06 DIAGNOSIS — M961 Postlaminectomy syndrome, not elsewhere classified: Secondary | ICD-10-CM | POA: Diagnosis not present

## 2019-06-09 ENCOUNTER — Other Ambulatory Visit: Payer: Self-pay | Admitting: Rheumatology

## 2019-06-09 ENCOUNTER — Telehealth: Payer: Self-pay | Admitting: Rheumatology

## 2019-06-09 NOTE — Telephone Encounter (Signed)
See refill request note

## 2019-06-09 NOTE — Telephone Encounter (Signed)
Patient left a voicemail requesting prescription refill of Prednisone 1 mg tablet and Prednisone 5 mg tablet.  Patient states Dr. Estanislado Pandy would send in the prescription refills after her MRI which she had on 05/31/19.

## 2019-06-09 NOTE — Telephone Encounter (Signed)
Last Visit: 02/06/19 Next Visit: 07/11/19  Patient states she is currently on Prednisone 8 mg and is suppose to taper to 7 mg. Patient state she states she had the MRI on her foot. Patient states she has tear in a ligament in her foot. She said getting around is difficult. Patient would like to know if she should taper or stay at 8 mg. Please advise.

## 2019-06-10 NOTE — Telephone Encounter (Signed)
Okay to taper prednisone to 7 mg if she is doing well.  Okay to call in prescription.

## 2019-06-10 NOTE — Telephone Encounter (Signed)
Attempted to contact the patient and left message for patient to call the office.  

## 2019-06-11 DIAGNOSIS — N183 Chronic kidney disease, stage 3 (moderate): Secondary | ICD-10-CM | POA: Diagnosis not present

## 2019-06-11 DIAGNOSIS — M961 Postlaminectomy syndrome, not elsewhere classified: Secondary | ICD-10-CM | POA: Diagnosis not present

## 2019-06-11 DIAGNOSIS — I5032 Chronic diastolic (congestive) heart failure: Secondary | ICD-10-CM | POA: Diagnosis not present

## 2019-06-11 DIAGNOSIS — G894 Chronic pain syndrome: Secondary | ICD-10-CM | POA: Diagnosis not present

## 2019-06-11 DIAGNOSIS — I130 Hypertensive heart and chronic kidney disease with heart failure and stage 1 through stage 4 chronic kidney disease, or unspecified chronic kidney disease: Secondary | ICD-10-CM | POA: Diagnosis not present

## 2019-06-11 DIAGNOSIS — M48062 Spinal stenosis, lumbar region with neurogenic claudication: Secondary | ICD-10-CM | POA: Diagnosis not present

## 2019-06-11 NOTE — Telephone Encounter (Signed)
Patient advised okay to taper to Prednisone 7 mg/

## 2019-06-13 DIAGNOSIS — R262 Difficulty in walking, not elsewhere classified: Secondary | ICD-10-CM | POA: Diagnosis not present

## 2019-06-13 DIAGNOSIS — M6281 Muscle weakness (generalized): Secondary | ICD-10-CM | POA: Diagnosis not present

## 2019-06-13 DIAGNOSIS — I5042 Chronic combined systolic (congestive) and diastolic (congestive) heart failure: Secondary | ICD-10-CM | POA: Diagnosis not present

## 2019-06-13 DIAGNOSIS — G894 Chronic pain syndrome: Secondary | ICD-10-CM | POA: Diagnosis not present

## 2019-06-13 DIAGNOSIS — M353 Polymyalgia rheumatica: Secondary | ICD-10-CM | POA: Diagnosis not present

## 2019-06-13 DIAGNOSIS — D631 Anemia in chronic kidney disease: Secondary | ICD-10-CM | POA: Diagnosis not present

## 2019-06-13 DIAGNOSIS — I130 Hypertensive heart and chronic kidney disease with heart failure and stage 1 through stage 4 chronic kidney disease, or unspecified chronic kidney disease: Secondary | ICD-10-CM | POA: Diagnosis not present

## 2019-06-13 DIAGNOSIS — N183 Chronic kidney disease, stage 3 (moderate): Secondary | ICD-10-CM | POA: Diagnosis not present

## 2019-06-13 DIAGNOSIS — M48062 Spinal stenosis, lumbar region with neurogenic claudication: Secondary | ICD-10-CM | POA: Diagnosis not present

## 2019-06-13 DIAGNOSIS — M961 Postlaminectomy syndrome, not elsewhere classified: Secondary | ICD-10-CM | POA: Diagnosis not present

## 2019-06-13 DIAGNOSIS — Z6841 Body Mass Index (BMI) 40.0 and over, adult: Secondary | ICD-10-CM | POA: Diagnosis not present

## 2019-06-17 DIAGNOSIS — M961 Postlaminectomy syndrome, not elsewhere classified: Secondary | ICD-10-CM | POA: Diagnosis not present

## 2019-06-17 DIAGNOSIS — N183 Chronic kidney disease, stage 3 (moderate): Secondary | ICD-10-CM | POA: Diagnosis not present

## 2019-06-17 DIAGNOSIS — I5042 Chronic combined systolic (congestive) and diastolic (congestive) heart failure: Secondary | ICD-10-CM | POA: Diagnosis not present

## 2019-06-17 DIAGNOSIS — G894 Chronic pain syndrome: Secondary | ICD-10-CM | POA: Diagnosis not present

## 2019-06-17 DIAGNOSIS — M48062 Spinal stenosis, lumbar region with neurogenic claudication: Secondary | ICD-10-CM | POA: Diagnosis not present

## 2019-06-17 DIAGNOSIS — I130 Hypertensive heart and chronic kidney disease with heart failure and stage 1 through stage 4 chronic kidney disease, or unspecified chronic kidney disease: Secondary | ICD-10-CM | POA: Diagnosis not present

## 2019-06-19 DIAGNOSIS — M961 Postlaminectomy syndrome, not elsewhere classified: Secondary | ICD-10-CM | POA: Diagnosis not present

## 2019-06-19 DIAGNOSIS — I130 Hypertensive heart and chronic kidney disease with heart failure and stage 1 through stage 4 chronic kidney disease, or unspecified chronic kidney disease: Secondary | ICD-10-CM | POA: Diagnosis not present

## 2019-06-19 DIAGNOSIS — G894 Chronic pain syndrome: Secondary | ICD-10-CM | POA: Diagnosis not present

## 2019-06-19 DIAGNOSIS — M48062 Spinal stenosis, lumbar region with neurogenic claudication: Secondary | ICD-10-CM | POA: Diagnosis not present

## 2019-06-20 DIAGNOSIS — G894 Chronic pain syndrome: Secondary | ICD-10-CM | POA: Diagnosis not present

## 2019-06-20 DIAGNOSIS — M961 Postlaminectomy syndrome, not elsewhere classified: Secondary | ICD-10-CM | POA: Diagnosis not present

## 2019-06-20 DIAGNOSIS — I5042 Chronic combined systolic (congestive) and diastolic (congestive) heart failure: Secondary | ICD-10-CM | POA: Diagnosis not present

## 2019-06-20 DIAGNOSIS — M48062 Spinal stenosis, lumbar region with neurogenic claudication: Secondary | ICD-10-CM | POA: Diagnosis not present

## 2019-06-20 DIAGNOSIS — N183 Chronic kidney disease, stage 3 (moderate): Secondary | ICD-10-CM | POA: Diagnosis not present

## 2019-06-20 DIAGNOSIS — I130 Hypertensive heart and chronic kidney disease with heart failure and stage 1 through stage 4 chronic kidney disease, or unspecified chronic kidney disease: Secondary | ICD-10-CM | POA: Diagnosis not present

## 2019-06-24 DIAGNOSIS — I130 Hypertensive heart and chronic kidney disease with heart failure and stage 1 through stage 4 chronic kidney disease, or unspecified chronic kidney disease: Secondary | ICD-10-CM | POA: Diagnosis not present

## 2019-06-24 DIAGNOSIS — I5042 Chronic combined systolic (congestive) and diastolic (congestive) heart failure: Secondary | ICD-10-CM | POA: Diagnosis not present

## 2019-06-24 DIAGNOSIS — N183 Chronic kidney disease, stage 3 (moderate): Secondary | ICD-10-CM | POA: Diagnosis not present

## 2019-06-24 DIAGNOSIS — G894 Chronic pain syndrome: Secondary | ICD-10-CM | POA: Diagnosis not present

## 2019-06-24 DIAGNOSIS — M961 Postlaminectomy syndrome, not elsewhere classified: Secondary | ICD-10-CM | POA: Diagnosis not present

## 2019-06-24 DIAGNOSIS — M48062 Spinal stenosis, lumbar region with neurogenic claudication: Secondary | ICD-10-CM | POA: Diagnosis not present

## 2019-06-25 DIAGNOSIS — M961 Postlaminectomy syndrome, not elsewhere classified: Secondary | ICD-10-CM | POA: Diagnosis not present

## 2019-06-25 DIAGNOSIS — N183 Chronic kidney disease, stage 3 (moderate): Secondary | ICD-10-CM | POA: Diagnosis not present

## 2019-06-25 DIAGNOSIS — M48062 Spinal stenosis, lumbar region with neurogenic claudication: Secondary | ICD-10-CM | POA: Diagnosis not present

## 2019-06-25 DIAGNOSIS — I130 Hypertensive heart and chronic kidney disease with heart failure and stage 1 through stage 4 chronic kidney disease, or unspecified chronic kidney disease: Secondary | ICD-10-CM | POA: Diagnosis not present

## 2019-06-25 DIAGNOSIS — G894 Chronic pain syndrome: Secondary | ICD-10-CM | POA: Diagnosis not present

## 2019-06-25 DIAGNOSIS — I5042 Chronic combined systolic (congestive) and diastolic (congestive) heart failure: Secondary | ICD-10-CM | POA: Diagnosis not present

## 2019-06-27 NOTE — Progress Notes (Deleted)
Office Visit Note  Patient: Nicole [name]             Date of Birth: 02-07-1946           MRN: [id number]             PCP: Sharilyn Sites, MD Referring: Sharilyn Sites, MD Visit Date: 07/11/2019 Occupation: @GUAROCC @  Subjective:  No chief complaint on file.  Screen for OP?  Last DEXA on 07/12/2017 normal.  Consider decreasing dose of PLQ to once daily due to kidney function?  Plaquenil 200 mg 1 tablet twice daily Monday through Friday only and prednisone ?mg daily.  Last Plaquenil eye exam normal on 12/13/2017.  Most recent CBC/CMP showed elevated WBC, low hemoglobin, and elevated serum creatinine on 05/06/2018.  History of Present Illness: Nicole [name] is a 73 y.o. female ***   Activities of Daily Living:  Patient reports morning stiffness for *** {minute/hour:19697}.   Patient {ACTIONS;DENIES/REPORTS:21021675::"Denies"} nocturnal pain.  Difficulty dressing/grooming: {ACTIONS;DENIES/REPORTS:21021675::"Denies"} Difficulty climbing stairs: {ACTIONS;DENIES/REPORTS:21021675::"Denies"} Difficulty getting out of chair: {ACTIONS;DENIES/REPORTS:21021675::"Denies"} Difficulty using hands for taps, buttons, cutlery, and/or writing: {ACTIONS;DENIES/REPORTS:21021675::"Denies"}  No Rheumatology ROS completed.   PMFS History:  Patient Active Problem List   Diagnosis Date Noted   Diverticulitis 05/02/2018   Diverticulitis of colon 05/01/2018   Morbid obesity with BMI of 50.0-59.9, adult ([place]) 05/01/2018   Chronic diastolic HF (heart failure) ([place]) 05/01/2018   On prednisone therapy 05/31/2017   Positive anti-CCP test 05/31/2017   Rheumatoid factor positive 05/31/2017   Memory loss 04/04/2017   Polymyalgia rheumatica ([place]) 04/04/2017   High risk medication use 12/07/2016   DDD (degenerative disc disease), lumbar 09/27/2016   HTN (hypertension) 06/05/2016   Chronic pain syndrome 06/05/2016   Acute diverticulitis 06/02/2016   Diverticulitis large intestine 06/02/2016    Spinal stenosis of lumbosacral region 02/29/2016   Joint pain 02/29/2016   Endometrial polyp 02/06/2014   Postmenopausal bleeding 01/30/2014   GERD (gastroesophageal reflux disease) 09/29/2013   Spinal stenosis, thoracic 09/29/2013   Shingles 09/29/2013   Thoracic or lumbosacral neuritis or radiculitis, unspecified 05/04/2011   Abnormality of gait 05/04/2011   Muscle weakness (generalized) 05/04/2011   Bilateral primary osteoarthritis of knee 04/07/2009   KNEE PAIN 04/07/2009    Past Medical History:  Diagnosis Date   AC (acromioclavicular) joint bone spurs    lt shoulder   Anemia    Arthritis    Carpal tunnel syndrome, bilateral    Gastroesophageal reflux    Headache    recent visit to ER @ [facility] for severe headache   Hypertension    Lumbar stenosis    Hx of ESIs by Dr. Nelva Bush   Polyarthralgia    Polymyalgia [facility])    Shingles    Spinal stenosis     Family History  Problem Relation Age of Onset   Hypertension Mother    Pneumonia Father    Arthritis Sister    Diabetes Paternal Grandmother    Past Surgical History:  Procedure Laterality Date   BACK SURGERY  07/05/2018, 07/2018   x2    CHOLECYSTECTOMY     COLONOSCOPY  06/11/2012   Procedure: COLONOSCOPY;  Surgeon: Jamesetta So, MD;  [facility];  Service: Gastroenterology;  Laterality: N/A;   HYSTEROSCOPY W/D&C N/A 02/25/2014   Procedure: DILATATION AND CURETTAGE /HYSTEROSCOPY;  Surgeon: Florian Buff, MD;  Location: [facility];  Service: Gynecology;  Laterality: N/A;   POLYPECTOMY N/A 02/25/2014   Procedure: POLYPECTOMY;  Surgeon: Florian Buff, MD;  Location:  [facility];  Service: Gynecology;  Laterality: N/A;   RESECTION DISTAL CLAVICAL Right 03/26/2015   Procedure: OPEN DISTAL CLAVICAL RESECTION ;  Surgeon: Netta Cedars, MD;  Location: [place];  Service: Orthopedics;  Laterality: Right;   Social History   Social History Narrative   Lives at home alone.   Right-handed.   1-2  cups caffeine daily.   Immunization History  Administered Date(s) Administered   Influenza,inj,quad, With Preservative 07/23/2017     Objective: Vital Signs: There were no vitals taken for this visit.   Physical Exam   Musculoskeletal Exam: ***  CDAI Exam: CDAI Score: -- Patient Global: --; Provider Global: -- Swollen: --; Tender: -- Joint Exam   No joint exam has been documented for this visit   There is currently no information documented on the homunculus. Go to the Rheumatology activity and complete the homunculus joint exam.  Investigation: No additional findings.  Imaging: Mr Foot Left Wo Contrast  Result Date: 06/01/2019 CLINICAL DATA:  Diffuse left ankle pain and swelling for 2 months. No improvement from injection 1 month ago. No acute injury or prior relevant surgery. EXAM: MRI OF THE LEFT FOOT WITHOUT CONTRAST TECHNIQUE: Multiplanar, multisequence MR imaging of the left forefoot was performed. No intravenous contrast was administered. COMPARISON:  None. FINDINGS: Ankle findings are dictated separately. Bones/Joint/Cartilage There are mild degenerative changes at the 1st metatarsophalangeal joint with small osteophytes and mild subchondral cyst formation. No other significant arthropathic changes are identified within the forefoot or midfoot. There are no significant joint effusions. There is no acute fracture, dislocation or bone destruction. Ligaments The Lisfranc ligament is intact. Muscles and Tendons Diffuse forefoot fatty muscular atrophy. No focal fluid collection. The forefoot tendons are intact without significant tenosynovitis. Soft tissues Prominent subcutaneous edema throughout the dorsum of the forefoot. No focal fluid collections are identified. IMPRESSION: 1. Prominent nonspecific subcutaneous edema throughout the dorsum of the forefoot. No focal fluid collection. 2. Diffuse muscular fatty atrophy. 3. Mild ordinary degenerative changes at the 1st  metatarsophalangeal joint. No other significant arthropathy or acute osseous findings. 4. Ankle findings are dictated separately. Electronically Signed   By: Richardean Sale M.D.   On: 06/01/2019 09:39   Mr Ankle Left Wo Contrast  Result Date: 06/01/2019 CLINICAL DATA:  Diffuse left ankle pain and swelling for 2 months. No improvement from injection 1 month ago. No acute injury or prior relevant surgery. EXAM: MRI OF THE LEFT ANKLE WITHOUT CONTRAST TECHNIQUE: Multiplanar, multisequence MR imaging of the ankle was performed. No intravenous contrast was administered. COMPARISON:  None. FINDINGS: Forefoot findings are dictated separately. TENDONS Peroneal: There is a longitudinal split tear of the peroneus brevis tendon at the level of the lateral malleolus. The peroneus longus tendon appears normal. The peroneal tendons are normally located, and there is no significant associated sheath fluid. Posteromedial: Intact and normally positioned. Anterior: Intact and normally positioned. Achilles: Intact. There are enthesophytes in the posterior aspect of the calcaneal tuberosity at the Achilles insertion. Plantar Fascia: Intact. Small plantar calcaneal spur without reactive edema. LIGAMENTS Lateral: The anterior and posterior talofibular and calcaneofibular ligaments are intact. Medial: The deltoid and visualized portions of the spring ligament appear intact. CARTILAGE AND BONES Ankle Joint: No significant ankle joint effusion. Minimal tibiotalar degenerative changes medially. Subtalar Joints/Sinus Tarsi: Minimal subtalar arthropathy with edema in the tarsal sinus, extending laterally into the sub fibular space. No focal fluid collection. Bones: No acute osseous findings. No significant midfoot arthropathy. Other: There is marked subcutaneous edema in  the lower leg, extending into the dorsum of the foot. No focal fluid collections are seen. There is diffuse fatty atrophy of the musculature in the lower leg and hindfoot.  IMPRESSION: 1. Nonspecific marked subcutaneous edema in the lower leg, extending into the dorsum of the foot. No focal fluid collections are identified. There is diffuse muscular atrophy. 2. Small longitudinal split tear of the peroneus brevis tendon. The additional ankle tendons are intact. 3. Probable synovitis in the tarsal sinus with extension laterally to the sub fibular area. 4. No acute osseous findings. Minimal degenerative changes as described. Electronically Signed   By: Richardean Sale M.D.   On: 06/01/2019 09:33    Recent Labs: Lab Results  Component Value Date   WBC 12.5 (H) 05/06/2018   HGB 10.4 (L) 05/06/2018   PLT 358 05/06/2018   NA 140 05/06/2018   K 3.7 05/06/2018   CL 102 05/06/2018   CO2 27 05/06/2018   GLUCOSE 96 05/06/2018   BUN 21 05/06/2018   CREATININE 1.69 (H) 05/06/2018   BILITOT 0.9 05/01/2018   ALKPHOS 68 05/01/2018   AST 19 05/01/2018   ALT 21 05/01/2018   PROT 6.4 (L) 05/01/2018   ALBUMIN 3.0 (L) 05/01/2018   CALCIUM 9.0 05/06/2018   GFRAA 34 (L) 05/06/2018    Speciality Comments: PLQ Eye Exam: 12/13/17 WNl @ [facility]  Procedures:  No procedures performed Allergies: Oxycontin [oxycodone hcl], Sulfa antibiotics, Tramadol, Penicillins, and Sulfasalazine   Assessment / Plan:     Visit Diagnoses: No diagnosis found.  Orders: No orders of the defined types were placed in this encounter.  No orders of the defined types were placed in this encounter.   Face-to-face time spent with patient was *** minutes. Greater than 50% of time was spent in counseling and coordination of care.  Follow-Up Instructions: No follow-ups on file.   Earnestine Mealing, CMA  Note - This record has been created using Editor, commissioning.  Chart creation errors have been sought, but may not always  have been located. Such creation errors do not reflect on  the standard of medical care.

## 2019-07-01 DIAGNOSIS — N183 Chronic kidney disease, stage 3 (moderate): Secondary | ICD-10-CM | POA: Diagnosis not present

## 2019-07-01 DIAGNOSIS — G894 Chronic pain syndrome: Secondary | ICD-10-CM | POA: Diagnosis not present

## 2019-07-01 DIAGNOSIS — I130 Hypertensive heart and chronic kidney disease with heart failure and stage 1 through stage 4 chronic kidney disease, or unspecified chronic kidney disease: Secondary | ICD-10-CM | POA: Diagnosis not present

## 2019-07-01 DIAGNOSIS — I5042 Chronic combined systolic (congestive) and diastolic (congestive) heart failure: Secondary | ICD-10-CM | POA: Diagnosis not present

## 2019-07-01 DIAGNOSIS — M961 Postlaminectomy syndrome, not elsewhere classified: Secondary | ICD-10-CM | POA: Diagnosis not present

## 2019-07-01 DIAGNOSIS — M48062 Spinal stenosis, lumbar region with neurogenic claudication: Secondary | ICD-10-CM | POA: Diagnosis not present

## 2019-07-07 DIAGNOSIS — I5042 Chronic combined systolic (congestive) and diastolic (congestive) heart failure: Secondary | ICD-10-CM | POA: Diagnosis not present

## 2019-07-07 DIAGNOSIS — N183 Chronic kidney disease, stage 3 (moderate): Secondary | ICD-10-CM | POA: Diagnosis not present

## 2019-07-07 DIAGNOSIS — G894 Chronic pain syndrome: Secondary | ICD-10-CM | POA: Diagnosis not present

## 2019-07-07 DIAGNOSIS — M961 Postlaminectomy syndrome, not elsewhere classified: Secondary | ICD-10-CM | POA: Diagnosis not present

## 2019-07-07 DIAGNOSIS — I130 Hypertensive heart and chronic kidney disease with heart failure and stage 1 through stage 4 chronic kidney disease, or unspecified chronic kidney disease: Secondary | ICD-10-CM | POA: Diagnosis not present

## 2019-07-07 DIAGNOSIS — M48062 Spinal stenosis, lumbar region with neurogenic claudication: Secondary | ICD-10-CM | POA: Diagnosis not present

## 2019-07-08 ENCOUNTER — Other Ambulatory Visit: Payer: Self-pay | Admitting: Rheumatology

## 2019-07-09 DIAGNOSIS — I130 Hypertensive heart and chronic kidney disease with heart failure and stage 1 through stage 4 chronic kidney disease, or unspecified chronic kidney disease: Secondary | ICD-10-CM | POA: Diagnosis not present

## 2019-07-09 DIAGNOSIS — I5042 Chronic combined systolic (congestive) and diastolic (congestive) heart failure: Secondary | ICD-10-CM | POA: Diagnosis not present

## 2019-07-09 DIAGNOSIS — M961 Postlaminectomy syndrome, not elsewhere classified: Secondary | ICD-10-CM | POA: Diagnosis not present

## 2019-07-09 DIAGNOSIS — N183 Chronic kidney disease, stage 3 (moderate): Secondary | ICD-10-CM | POA: Diagnosis not present

## 2019-07-09 DIAGNOSIS — M48062 Spinal stenosis, lumbar region with neurogenic claudication: Secondary | ICD-10-CM | POA: Diagnosis not present

## 2019-07-09 DIAGNOSIS — G894 Chronic pain syndrome: Secondary | ICD-10-CM | POA: Diagnosis not present

## 2019-07-09 NOTE — Telephone Encounter (Signed)
Last Visit: 02/06/19 Next Visit: 07/11/19  Patient is tapering down to 6 mg of prednisone.

## 2019-07-10 DIAGNOSIS — I509 Heart failure, unspecified: Secondary | ICD-10-CM | POA: Diagnosis not present

## 2019-07-10 DIAGNOSIS — M48061 Spinal stenosis, lumbar region without neurogenic claudication: Secondary | ICD-10-CM | POA: Diagnosis not present

## 2019-07-10 DIAGNOSIS — I10 Essential (primary) hypertension: Secondary | ICD-10-CM | POA: Diagnosis not present

## 2019-07-10 DIAGNOSIS — M069 Rheumatoid arthritis, unspecified: Secondary | ICD-10-CM | POA: Diagnosis not present

## 2019-07-11 ENCOUNTER — Ambulatory Visit: Payer: Self-pay | Admitting: Rheumatology

## 2019-07-11 NOTE — Progress Notes (Signed)
Office Visit Note  Patient: Nicole [name]             Date of Birth: 1946-04-03           MRN: [id number]             PCP: Sharilyn Sites, MD Referring: Sharilyn Sites, MD Visit Date: 07/15/2019 Occupation: @GUAROCC @  Subjective:  Muscle weakness    History of Present Illness: Nicole [name] is a 73 y.o. female with history of PMR and osteoarthritis.  She is currently taking prednisone 6 mg po daily.  She is tapering by 1 mg every month.  She has not restarted on Plaquenil since discontinuing in July 2019.  She denies any temporal artery tenderness or recent headaches.  She continues to have muscle weakness due to deconditioning.  She denies any increased muscle tenderness.   She works with a home therapist 1-2 times weekly.  She has intermittent pain in both shoulder joints, both knee joints, and both ankle joints.  She has started walking with a walker and using a wheelchair. She needs assistance with walking.   Activities of Daily Living:  Patient reports morning stiffness for 30-60  minutes.   Patient Denies nocturnal pain.  Difficulty dressing/grooming: Denies Difficulty climbing stairs: Reports Difficulty getting out of chair: Reports Difficulty using hands for taps, buttons, cutlery, and/or writing: Reports  Review of Systems  Constitutional: Negative for fatigue.  HENT: Positive for mouth dryness. Negative for mouth sores and nose dryness.   Eyes: Positive for dryness. Negative for pain and visual disturbance.  Respiratory: Negative for cough, hemoptysis, shortness of breath and difficulty breathing.   Cardiovascular: Negative for chest pain, palpitations, hypertension and swelling in legs/feet.  Gastrointestinal: Positive for constipation (SE of iron supplement ). Negative for blood in stool and diarrhea.  Endocrine: Negative for increased urination.  Genitourinary: Negative for painful urination.  Musculoskeletal: Positive for myalgias, muscle weakness, muscle tenderness and  myalgias. Negative for arthralgias, joint pain, joint swelling and morning stiffness.  Skin: Negative for color change, pallor, rash, hair loss, nodules/bumps, skin tightness, ulcers and sensitivity to sunlight.  Allergic/Immunologic: Negative for susceptible to infections.  Neurological: Negative for dizziness, numbness, headaches and weakness.  Hematological: Negative for swollen glands.  Psychiatric/Behavioral: Negative for depressed mood and sleep disturbance. The patient is not nervous/anxious.     PMFS History:  Patient Active Problem List   Diagnosis Date Noted  . Diverticulitis 05/02/2018  . Diverticulitis of colon 05/01/2018  . Morbid obesity with BMI of 50.0-59.9, adult ([place]) 05/01/2018  . Chronic diastolic HF (heart failure) ([place]) 05/01/2018  . On prednisone therapy 05/31/2017  . Positive anti-CCP test 05/31/2017  . Rheumatoid factor positive 05/31/2017  . Memory loss 04/04/2017  . Polymyalgia rheumatica ([place]) 04/04/2017  . High risk medication use 12/07/2016  . DDD (degenerative disc disease), lumbar 09/27/2016  . HTN (hypertension) 06/05/2016  . Chronic pain syndrome 06/05/2016  . Acute diverticulitis 06/02/2016  . Diverticulitis large intestine 06/02/2016  . Spinal stenosis of lumbosacral region 02/29/2016  . Joint pain 02/29/2016  . Endometrial polyp 02/06/2014  . Postmenopausal bleeding 01/30/2014  . GERD (gastroesophageal reflux disease) 09/29/2013  . Spinal stenosis, thoracic 09/29/2013  . Shingles 09/29/2013  . Thoracic or lumbosacral neuritis or radiculitis, unspecified 05/04/2011  . Abnormality of gait 05/04/2011  . Muscle weakness (generalized) 05/04/2011  . Bilateral primary osteoarthritis of knee 04/07/2009  . KNEE PAIN 04/07/2009    Past Medical History:  Diagnosis Date  . AC (acromioclavicular) joint bone spurs  lt shoulder  . Anemia   . Arthritis   . Carpal tunnel syndrome, bilateral   . Gastroesophageal reflux   . Headache    recent visit  to ER @ [facility] for severe headache  . Hypertension   . Lumbar stenosis    Hx of ESIs by Dr. Nelva Bush  . Polyarthralgia   . Polymyalgia ([place])   . Shingles   . Spinal stenosis     Family History  Problem Relation Age of Onset  . Hypertension Mother   . Pneumonia Father   . Arthritis Sister   . Diabetes Paternal Grandmother    Past Surgical History:  Procedure Laterality Date  . BACK SURGERY  07/05/2018, 07/2018   x2   . CHOLECYSTECTOMY    . COLONOSCOPY  06/11/2012   Procedure: COLONOSCOPY;  Surgeon: Jamesetta So, MD;  [facility];  Service: Gastroenterology;  Laterality: N/A;  . HYSTEROSCOPY W/D&C N/A 02/25/2014   Procedure: DILATATION AND CURETTAGE /HYSTEROSCOPY;  Surgeon: Florian Buff, MD;  Location: [facility];  Service: Gynecology;  Laterality: N/A;  . POLYPECTOMY N/A 02/25/2014   Procedure: POLYPECTOMY;  Surgeon: Florian Buff, MD;  Location: [facility];  Service: Gynecology;  Laterality: N/A;  . RESECTION DISTAL CLAVICAL Right 03/26/2015   Procedure: OPEN DISTAL CLAVICAL RESECTION ;  Surgeon: Netta Cedars, MD;  Location: [place];  Service: Orthopedics;  Laterality: Right;   Social History   Social History Narrative   Lives at home alone.   Right-handed.   1-2 cups caffeine daily.   Immunization History  Administered Date(s) Administered  . Influenza,inj,quad, With Preservative 07/23/2017     Objective: Vital Signs: BP 130/79 (BP Location: Right Wrist, Patient Position: Sitting, Cuff Size: Normal)   Pulse 83   Resp 16   Ht 5' (1.524 m)   Wt 264 lb (119.7 kg)   BMI 51.56 kg/m    Physical Exam Vitals signs and nursing note reviewed.  Constitutional:      Appearance: She is well-developed.  HENT:     Head: Normocephalic and atraumatic.     Comments: No temporal artery tenderness  Eyes:     Conjunctiva/sclera: Conjunctivae normal.  Neck:     Musculoskeletal: Normal range of motion.  Cardiovascular:     Rate and Rhythm: Normal rate and regular rhythm.      Heart sounds: Normal heart sounds.  Pulmonary:     Effort: Pulmonary effort is normal.     Breath sounds: Normal breath sounds.  Abdominal:     General: Bowel sounds are normal.     Palpations: Abdomen is soft.  Lymphadenopathy:     Cervical: No cervical adenopathy.  Skin:    General: Skin is warm and dry.     Capillary Refill: Capillary refill takes less than 2 seconds.  Neurological:     Mental Status: She is alert and oriented to person, place, and time.  Psychiatric:        Behavior: Behavior normal.      Musculoskeletal Exam: C-spine good ROM.  Thoracic kyphosis noted.  Difficult to assess lumbar ROM while in wheelchair.  She has limited abduction and forward flexion of both shoulder joints with discomfort.  Elbow joints, wrist joints, MCPs, PIPs, and DIPs good ROM with no synovitis.  Knee joints good ROM with no warmth or effusion.  Pedal edema bilaterally.   CDAI Exam: CDAI Score: - Patient Global: -; Provider Global: - Swollen: -; Tender: - Joint Exam   No joint  exam has been documented for this visit   There is currently no information documented on the homunculus. Go to the Rheumatology activity and complete the homunculus joint exam.  Investigation: No additional findings.  Imaging: No results found.  Recent Labs: Lab Results  Component Value Date   WBC 12.5 (H) 05/06/2018   HGB 10.4 (L) 05/06/2018   PLT 358 05/06/2018   NA 140 05/06/2018   K 3.7 05/06/2018   CL 102 05/06/2018   CO2 27 05/06/2018   GLUCOSE 96 05/06/2018   BUN 21 05/06/2018   CREATININE 1.69 (H) 05/06/2018   BILITOT 0.9 05/01/2018   ALKPHOS 68 05/01/2018   AST 19 05/01/2018   ALT 21 05/01/2018   PROT 6.4 (L) 05/01/2018   ALBUMIN 3.0 (L) 05/01/2018   CALCIUM 9.0 05/06/2018   GFRAA 34 (L) 05/06/2018    Speciality Comments: PLQ Eye Exam: 12/13/17 WNl @ [facility]  Procedures:  No procedures performed Allergies: Oxycontin [oxycodone hcl], Sulfa antibiotics, Tramadol,  Penicillins, and Sulfasalazine    Assessment / Plan:     Visit Diagnoses: Temporal arteritis ([place]) -She has not had any recurrences.  She has no temporal artery tenderness or recent headaches.  She is currently taking prednisone 6 mg po daily and is tapering by 1 mg every month.  She was advised to notify us if she develops signs or symptoms of a flare.  She will follow up in 3 months.  Plan: Sedimentation rate  Polymyalgia rheumatica ([place]) - She continues to have muscle weakness in upper and lower extremities.  She experiencing intermittent myalgias.  She has difficulty getting up from a wheelchair/seated position due to lower extremity muscular deconditioning.  She has been working with a home therapist 1-2 times weekly.  She performs home exercises on her own as well.  She is currently on prednisone 6 mg po daily and is tapering by 1 mg every month.  She has not noticed any new or worsening symptoms since starting to taper prednisone.  She will continue on this tapering schedule.  We will check a sed rate today.  She will follow up in 3 months. Plan: Sedimentation rate  High risk medication use -She has not restarted on PLQ since July of 2019. She is taking prednisone 6 mg po daily and tapering by 1 mg every month.  CBC and CMP will be drawn today. - Plan: COMPLETE METABOLIC PANEL WITH GFR, CBC with Differential/Platelet  On prednisone therapy - Prednisone 6 mg daily.  She is tapering by 1 mg every month.  DEXA order placed today.- Plan: DG BONE DENSITY (DXA)  Rheumatoid factor positive: She has no active synovitis.   Positive anti-CCP test: No synovitis.   Chronic pain syndrome: She continues have generalized pain.  She is on Nucynta and is taking it as prescribed.  Primary osteoarthritis of both knees: She has chronic pain in bilateral knee joints.  No warmth or effusion was noted.  She has good range of motion.  Discussed importance of lower extremity muscle strengthening.  She is primarily  in a wheelchair but does use a walker and walks with assistance for short period time a daily basis.  We discussed importance of performing knee joint exercises.  She has been seeing a home therapist once to twice weekly.  DDD (degenerative disc disease), lumbar  Spinal stenosis, thoracic: She has thoracic kyphosis.  No midline spinal tenderness in thoracic region.   Osteoporosis screening -DEXA 07/12/2017 right femoral neck BMD 0.843 with T  score -0.8.  She has been on long-term prednisone.  She is currently on prednisone 6 mg by mouth daily.  She has not had any recent falls or fractures.  An updated DEXA will be ordered today.  Plan: DG BONE DENSITY (DXA)  Other medical conditions are listed as follows:   Vitamin D deficiency  History of hypertension  History of gastroesophageal reflux (GERD)  Memory loss  History of diverticulitis    Orders: Orders Placed This Encounter  Procedures  . DG BONE DENSITY (DXA)  . COMPLETE METABOLIC PANEL WITH GFR  . CBC with Differential/Platelet  . Sedimentation rate   No orders of the defined types were placed in this encounter.   Face-to-face time spent with patient was 30 minutes. Greater than 50% of time was spent in counseling and coordination of care.  Follow-Up Instructions: Return in about 3 months (around 10/14/2019) for Polymyalgia Rheumatica, Osteoarthritis, DDD.   Ofilia Neas, PA-C  Note - This record has been created using Dragon software.  Chart creation errors have been sought, but may not always  have been located. Such creation errors do not reflect on  the standard of medical care.

## 2019-07-13 DIAGNOSIS — G894 Chronic pain syndrome: Secondary | ICD-10-CM | POA: Diagnosis not present

## 2019-07-13 DIAGNOSIS — Z6841 Body Mass Index (BMI) 40.0 and over, adult: Secondary | ICD-10-CM | POA: Diagnosis not present

## 2019-07-13 DIAGNOSIS — M961 Postlaminectomy syndrome, not elsewhere classified: Secondary | ICD-10-CM | POA: Diagnosis not present

## 2019-07-13 DIAGNOSIS — I5042 Chronic combined systolic (congestive) and diastolic (congestive) heart failure: Secondary | ICD-10-CM | POA: Diagnosis not present

## 2019-07-13 DIAGNOSIS — M353 Polymyalgia rheumatica: Secondary | ICD-10-CM | POA: Diagnosis not present

## 2019-07-13 DIAGNOSIS — M6281 Muscle weakness (generalized): Secondary | ICD-10-CM | POA: Diagnosis not present

## 2019-07-13 DIAGNOSIS — I130 Hypertensive heart and chronic kidney disease with heart failure and stage 1 through stage 4 chronic kidney disease, or unspecified chronic kidney disease: Secondary | ICD-10-CM | POA: Diagnosis not present

## 2019-07-13 DIAGNOSIS — D631 Anemia in chronic kidney disease: Secondary | ICD-10-CM | POA: Diagnosis not present

## 2019-07-13 DIAGNOSIS — R262 Difficulty in walking, not elsewhere classified: Secondary | ICD-10-CM | POA: Diagnosis not present

## 2019-07-13 DIAGNOSIS — N183 Chronic kidney disease, stage 3 (moderate): Secondary | ICD-10-CM | POA: Diagnosis not present

## 2019-07-13 DIAGNOSIS — M48062 Spinal stenosis, lumbar region with neurogenic claudication: Secondary | ICD-10-CM | POA: Diagnosis not present

## 2019-07-15 ENCOUNTER — Ambulatory Visit (INDEPENDENT_AMBULATORY_CARE_PROVIDER_SITE_OTHER): Payer: Medicare Other | Admitting: Physician Assistant

## 2019-07-15 ENCOUNTER — Encounter: Payer: Self-pay | Admitting: Physician Assistant

## 2019-07-15 ENCOUNTER — Other Ambulatory Visit: Payer: Self-pay

## 2019-07-15 VITALS — BP 130/79 | HR 83 | Resp 16 | Ht 60.0 in | Wt 264.0 lb

## 2019-07-15 DIAGNOSIS — Z79899 Other long term (current) drug therapy: Secondary | ICD-10-CM

## 2019-07-15 DIAGNOSIS — E559 Vitamin D deficiency, unspecified: Secondary | ICD-10-CM

## 2019-07-15 DIAGNOSIS — M5136 Other intervertebral disc degeneration, lumbar region: Secondary | ICD-10-CM | POA: Diagnosis not present

## 2019-07-15 DIAGNOSIS — R768 Other specified abnormal immunological findings in serum: Secondary | ICD-10-CM | POA: Diagnosis not present

## 2019-07-15 DIAGNOSIS — Z8719 Personal history of other diseases of the digestive system: Secondary | ICD-10-CM

## 2019-07-15 DIAGNOSIS — Z8679 Personal history of other diseases of the circulatory system: Secondary | ICD-10-CM | POA: Diagnosis not present

## 2019-07-15 DIAGNOSIS — M353 Polymyalgia rheumatica: Secondary | ICD-10-CM

## 2019-07-15 DIAGNOSIS — M170 Bilateral primary osteoarthritis of knee: Secondary | ICD-10-CM | POA: Diagnosis not present

## 2019-07-15 DIAGNOSIS — G894 Chronic pain syndrome: Secondary | ICD-10-CM | POA: Diagnosis not present

## 2019-07-15 DIAGNOSIS — M316 Other giant cell arteritis: Secondary | ICD-10-CM

## 2019-07-15 DIAGNOSIS — Z7952 Long term (current) use of systemic steroids: Secondary | ICD-10-CM

## 2019-07-15 DIAGNOSIS — M4804 Spinal stenosis, thoracic region: Secondary | ICD-10-CM

## 2019-07-15 DIAGNOSIS — M51369 Other intervertebral disc degeneration, lumbar region without mention of lumbar back pain or lower extremity pain: Secondary | ICD-10-CM

## 2019-07-15 DIAGNOSIS — R413 Other amnesia: Secondary | ICD-10-CM

## 2019-07-15 DIAGNOSIS — Z13820 Encounter for screening for osteoporosis: Secondary | ICD-10-CM

## 2019-07-16 LAB — CBC WITH DIFFERENTIAL/PLATELET
Absolute Monocytes: 834 cells/uL (ref 200–950)
Basophils Absolute: 41 cells/uL (ref 0–200)
Basophils Relative: 0.4 %
Eosinophils Absolute: 319 cells/uL (ref 15–500)
Eosinophils Relative: 3.1 %
HCT: 36.1 % (ref 35.0–45.0)
Hemoglobin: 12.1 g/dL (ref 11.7–15.5)
Lymphs Abs: 2771 cells/uL (ref 850–3900)
MCH: 29.8 pg (ref 27.0–33.0)
MCHC: 33.5 g/dL (ref 32.0–36.0)
MCV: 88.9 fL (ref 80.0–100.0)
MPV: 9.7 fL (ref 7.5–12.5)
Monocytes Relative: 8.1 %
Neutro Abs: 6335 cells/uL (ref 1500–7800)
Neutrophils Relative %: 61.5 %
Platelets: 353 10*3/uL (ref 140–400)
RBC: 4.06 10*6/uL (ref 3.80–5.10)
RDW: 14.8 % (ref 11.0–15.0)
Total Lymphocyte: 26.9 %
WBC: 10.3 10*3/uL (ref 3.8–10.8)

## 2019-07-16 LAB — COMPLETE METABOLIC PANEL WITH GFR
AG Ratio: 1.2 (calc) (ref 1.0–2.5)
ALT: 19 U/L (ref 6–29)
AST: 22 U/L (ref 10–35)
Albumin: 3.8 g/dL (ref 3.6–5.1)
Alkaline phosphatase (APISO): 115 U/L (ref 37–153)
BUN/Creatinine Ratio: 21 (calc) (ref 6–22)
BUN: 33 mg/dL — ABNORMAL HIGH (ref 7–25)
CO2: 30 mmol/L (ref 20–32)
Calcium: 9.4 mg/dL (ref 8.6–10.4)
Chloride: 99 mmol/L (ref 98–110)
Creat: 1.57 mg/dL — ABNORMAL HIGH (ref 0.60–0.93)
GFR, Est African American: 38 mL/min/{1.73_m2} — ABNORMAL LOW (ref >=60)
GFR, Est Non African American: 32 mL/min/{1.73_m2} — ABNORMAL LOW (ref >=60)
Globulin: 3.1 g/dL (calc) (ref 1.9–3.7)
Glucose, Bld: 81 mg/dL (ref 65–99)
Potassium: 4.5 mmol/L (ref 3.5–5.3)
Sodium: 142 mmol/L (ref 135–146)
Total Bilirubin: 0.4 mg/dL (ref 0.2–1.2)
Total Protein: 6.9 g/dL (ref 6.1–8.1)

## 2019-07-16 LAB — SEDIMENTATION RATE: Sed Rate: 86 mm/h — ABNORMAL HIGH (ref 0–30)

## 2019-07-16 NOTE — Progress Notes (Signed)
Sed rate is elevated-86.  Discussed with Dr. Estanislado Pandy.  She recommends increasing prednisone to 8 mg po daily.  She would like to see Gimena back in 1 month to recheck sed rate and discuss other steroid sparing agents, such as [place].   MTX will not be an option due to elevated creatinine-1.57 and low GFR-38.   CBC WNL

## 2019-07-17 ENCOUNTER — Telehealth: Payer: Self-pay | Admitting: Rheumatology

## 2019-07-17 DIAGNOSIS — I5042 Chronic combined systolic (congestive) and diastolic (congestive) heart failure: Secondary | ICD-10-CM | POA: Diagnosis not present

## 2019-07-17 DIAGNOSIS — M961 Postlaminectomy syndrome, not elsewhere classified: Secondary | ICD-10-CM | POA: Diagnosis not present

## 2019-07-17 DIAGNOSIS — N183 Chronic kidney disease, stage 3 (moderate): Secondary | ICD-10-CM | POA: Diagnosis not present

## 2019-07-17 DIAGNOSIS — M48062 Spinal stenosis, lumbar region with neurogenic claudication: Secondary | ICD-10-CM | POA: Diagnosis not present

## 2019-07-17 DIAGNOSIS — G894 Chronic pain syndrome: Secondary | ICD-10-CM | POA: Diagnosis not present

## 2019-07-17 DIAGNOSIS — I130 Hypertensive heart and chronic kidney disease with heart failure and stage 1 through stage 4 chronic kidney disease, or unspecified chronic kidney disease: Secondary | ICD-10-CM | POA: Diagnosis not present

## 2019-07-17 NOTE — Telephone Encounter (Signed)
Labs faxed to PCP

## 2019-07-17 NOTE — Telephone Encounter (Signed)
Patient calling to request lab results to be sent to PCP Dr. Delanna Ahmadi office. PCP wants to keep up with the results also.

## 2019-07-23 ENCOUNTER — Other Ambulatory Visit: Payer: Self-pay | Admitting: Rheumatology

## 2019-07-23 DIAGNOSIS — M961 Postlaminectomy syndrome, not elsewhere classified: Secondary | ICD-10-CM | POA: Diagnosis not present

## 2019-07-23 DIAGNOSIS — N183 Chronic kidney disease, stage 3 (moderate): Secondary | ICD-10-CM | POA: Diagnosis not present

## 2019-07-23 DIAGNOSIS — I130 Hypertensive heart and chronic kidney disease with heart failure and stage 1 through stage 4 chronic kidney disease, or unspecified chronic kidney disease: Secondary | ICD-10-CM | POA: Diagnosis not present

## 2019-07-23 DIAGNOSIS — M48062 Spinal stenosis, lumbar region with neurogenic claudication: Secondary | ICD-10-CM | POA: Diagnosis not present

## 2019-07-23 DIAGNOSIS — E782 Mixed hyperlipidemia: Secondary | ICD-10-CM | POA: Diagnosis not present

## 2019-07-23 DIAGNOSIS — G894 Chronic pain syndrome: Secondary | ICD-10-CM | POA: Diagnosis not present

## 2019-07-23 DIAGNOSIS — I5042 Chronic combined systolic (congestive) and diastolic (congestive) heart failure: Secondary | ICD-10-CM | POA: Diagnosis not present

## 2019-07-23 DIAGNOSIS — F329 Major depressive disorder, single episode, unspecified: Secondary | ICD-10-CM | POA: Diagnosis not present

## 2019-07-23 NOTE — Telephone Encounter (Signed)
Last Visit: 07/15/19 Next Visit: 08/18/19  Okay to refill per Dr. Estanislado Pandy

## 2019-07-29 DIAGNOSIS — N183 Chronic kidney disease, stage 3 (moderate): Secondary | ICD-10-CM | POA: Diagnosis not present

## 2019-07-29 DIAGNOSIS — I130 Hypertensive heart and chronic kidney disease with heart failure and stage 1 through stage 4 chronic kidney disease, or unspecified chronic kidney disease: Secondary | ICD-10-CM | POA: Diagnosis not present

## 2019-07-29 DIAGNOSIS — G894 Chronic pain syndrome: Secondary | ICD-10-CM | POA: Diagnosis not present

## 2019-07-29 DIAGNOSIS — M961 Postlaminectomy syndrome, not elsewhere classified: Secondary | ICD-10-CM | POA: Diagnosis not present

## 2019-07-29 DIAGNOSIS — M48062 Spinal stenosis, lumbar region with neurogenic claudication: Secondary | ICD-10-CM | POA: Diagnosis not present

## 2019-07-29 DIAGNOSIS — I5042 Chronic combined systolic (congestive) and diastolic (congestive) heart failure: Secondary | ICD-10-CM | POA: Diagnosis not present

## 2019-08-01 DIAGNOSIS — M48062 Spinal stenosis, lumbar region with neurogenic claudication: Secondary | ICD-10-CM | POA: Diagnosis not present

## 2019-08-01 DIAGNOSIS — I130 Hypertensive heart and chronic kidney disease with heart failure and stage 1 through stage 4 chronic kidney disease, or unspecified chronic kidney disease: Secondary | ICD-10-CM | POA: Diagnosis not present

## 2019-08-01 DIAGNOSIS — G894 Chronic pain syndrome: Secondary | ICD-10-CM | POA: Diagnosis not present

## 2019-08-01 DIAGNOSIS — I5042 Chronic combined systolic (congestive) and diastolic (congestive) heart failure: Secondary | ICD-10-CM | POA: Diagnosis not present

## 2019-08-01 DIAGNOSIS — M961 Postlaminectomy syndrome, not elsewhere classified: Secondary | ICD-10-CM | POA: Diagnosis not present

## 2019-08-01 DIAGNOSIS — N183 Chronic kidney disease, stage 3 (moderate): Secondary | ICD-10-CM | POA: Diagnosis not present

## 2019-08-04 DIAGNOSIS — M48062 Spinal stenosis, lumbar region with neurogenic claudication: Secondary | ICD-10-CM | POA: Diagnosis not present

## 2019-08-04 DIAGNOSIS — I5042 Chronic combined systolic (congestive) and diastolic (congestive) heart failure: Secondary | ICD-10-CM | POA: Diagnosis not present

## 2019-08-04 DIAGNOSIS — N183 Chronic kidney disease, stage 3 (moderate): Secondary | ICD-10-CM | POA: Diagnosis not present

## 2019-08-04 DIAGNOSIS — G894 Chronic pain syndrome: Secondary | ICD-10-CM | POA: Diagnosis not present

## 2019-08-04 DIAGNOSIS — I130 Hypertensive heart and chronic kidney disease with heart failure and stage 1 through stage 4 chronic kidney disease, or unspecified chronic kidney disease: Secondary | ICD-10-CM | POA: Diagnosis not present

## 2019-08-04 DIAGNOSIS — M961 Postlaminectomy syndrome, not elsewhere classified: Secondary | ICD-10-CM | POA: Diagnosis not present

## 2019-08-04 NOTE — Progress Notes (Signed)
Office Visit Note  Patient: Nicole [name]             Date of Birth: [birth year]           MRN: [id number]             PCP: Sharilyn Sites, MD Referring: Sharilyn Sites, MD Visit Date: 08/18/2019 Occupation: @GUAROCC @  Subjective:  Discuss medication options  History of Present Illness: Nicole [name] is a 73 y.o. female with history of PMR, temporal arteritis, and osteoarthritis.  She has been having increased lower back pain for the past 1 month.  She has been experiencing left sided radiculopathy.  She was evaluated by Dr. Redmond Pulling, and she reports she had updated x-rays at that time.  She cannot tolerate flexeril due to drowsiness. She continues to take prednisone 8 mg po daily.   She has been experiencing pain in multiple joints, but she denies any joint swelling currently.     Activities of Daily Living:  Patient reports morning stiffness for 30   minutes.   Patient Denies nocturnal pain.  Difficulty dressing/grooming: Denies Difficulty climbing stairs: Reports Difficulty getting out of chair: Reports Difficulty using hands for taps, buttons, cutlery, and/or writing: Denies  Review of Systems  Constitutional: Positive for fatigue.  HENT: Positive for mouth dryness. Negative for mouth sores and nose dryness.   Eyes: Negative for pain, visual disturbance and dryness.  Respiratory: Negative for cough, hemoptysis, shortness of breath and difficulty breathing.   Cardiovascular: Negative for chest pain, palpitations, hypertension and swelling in legs/feet.  Gastrointestinal: Negative for blood in stool, constipation and diarrhea.  Endocrine: Negative for increased urination.  Genitourinary: Negative for painful urination.  Musculoskeletal: Positive for arthralgias, joint pain, muscle weakness, morning stiffness and muscle tenderness. Negative for joint swelling, myalgias and myalgias.  Skin: Negative for color change, pallor, rash, hair loss, nodules/bumps, skin tightness, ulcers and  sensitivity to sunlight.  Allergic/Immunologic: Negative for susceptible to infections.  Neurological: Negative for dizziness, numbness, headaches and weakness.  Hematological: Negative for swollen glands.  Psychiatric/Behavioral: Negative for depressed mood and sleep disturbance. The patient is not nervous/anxious.     PMFS History:  Patient Active Problem List   Diagnosis Date Noted  . Diverticulitis 05/02/2018  . Diverticulitis of colon 05/01/2018  . Morbid obesity with BMI of 50.0-59.9, adult ([place]) 05/01/2018  . Chronic diastolic HF (heart failure) ([place]) 05/01/2018  . On prednisone therapy 05/31/2017  . Positive anti-CCP test 05/31/2017  . Rheumatoid factor positive 05/31/2017  . Memory loss 04/04/2017  . Polymyalgia rheumatica ([place]) 04/04/2017  . High risk medication use 12/07/2016  . DDD (degenerative disc disease), lumbar 09/27/2016  . HTN (hypertension) 06/05/2016  . Chronic pain syndrome 06/05/2016  . Acute diverticulitis 06/02/2016  . Diverticulitis large intestine 06/02/2016  . Spinal stenosis of lumbosacral region 02/29/2016  . Joint pain 02/29/2016  . Endometrial polyp 02/06/2014  . Postmenopausal bleeding 01/30/2014  . GERD (gastroesophageal reflux disease) 09/29/2013  . Spinal stenosis, thoracic 09/29/2013  . Shingles 09/29/2013  . Thoracic or lumbosacral neuritis or radiculitis, unspecified 05/04/2011  . Abnormality of gait 05/04/2011  . Muscle weakness (generalized) 05/04/2011  . Bilateral primary osteoarthritis of knee 04/07/2009  . KNEE PAIN 04/07/2009    Past Medical History:  Diagnosis Date  . AC (acromioclavicular) joint bone spurs    lt shoulder  . Anemia   . Arthritis   . Carpal tunnel syndrome, bilateral   . Gastroesophageal reflux   . Headache    recent visit to  ER @ [facility] for severe headache  . Hypertension   . Lumbar stenosis    Hx of ESIs by Dr. Nelva Bush  . Polyarthralgia   . Polymyalgia ([place])   . Shingles   . Spinal stenosis      Family History  Problem Relation Age of Onset  . Hypertension Mother   . Pneumonia Father   . Arthritis Sister   . Diabetes Paternal Grandmother    Past Surgical History:  Procedure Laterality Date  . BACK SURGERY  07/05/2018, 07/2018   x2   . CHOLECYSTECTOMY    . COLONOSCOPY  06/11/2012   Procedure: COLONOSCOPY;  Surgeon: Jamesetta So, MD;  Location: [facility];  Service: Gastroenterology;  Laterality: N/A;  . HYSTEROSCOPY W/D&C N/A 02/25/2014   Procedure: DILATATION AND CURETTAGE /HYSTEROSCOPY;  Surgeon: Florian Buff, MD;  Location: [facility];  Service: Gynecology;  Laterality: N/A;  . POLYPECTOMY N/A 02/25/2014   Procedure: POLYPECTOMY;  Surgeon: Florian Buff, MD;  Location: [facility];  Service: Gynecology;  Laterality: N/A;  . RESECTION DISTAL CLAVICAL Right 03/26/2015   Procedure: OPEN DISTAL CLAVICAL RESECTION ;  Surgeon: Netta Cedars, MD;  Location: [facility];  Service: Orthopedics;  Laterality: Right;   Social History   Social History Narrative   Lives at home alone.   Right-handed.   1-2 cups caffeine daily.   Immunization History  Administered Date(s) Administered  . Influenza,inj,quad, With Preservative 07/23/2017     Objective: Vital Signs: BP 133/77 (BP Location: Left Wrist, Patient Position: Sitting, Cuff Size: Normal)   Pulse 77   Resp 15   Ht 5' (1.524 m)   Wt 266 lb 6.4 oz (120.8 kg)   BMI 52.03 kg/m    Physical Exam Vitals signs and nursing note reviewed.  Constitutional:      Appearance: She is well-developed.  HENT:     Head: Normocephalic and atraumatic.  Eyes:     Conjunctiva/sclera: Conjunctivae normal.  Neck:     Musculoskeletal: Normal range of motion.  Cardiovascular:     Rate and Rhythm: Normal rate and regular rhythm.     Heart sounds: Normal heart sounds.  Pulmonary:     Effort: Pulmonary effort is normal.     Breath sounds: Normal breath sounds.  Abdominal:     General: Bowel sounds are normal.     Palpations: Abdomen is soft.   Lymphadenopathy:     Cervical: No cervical adenopathy.  Skin:    General: Skin is warm and dry.     Capillary Refill: Capillary refill takes less than 2 seconds.  Neurological:     Mental Status: She is alert and oriented to person, place, and time.  Psychiatric:        Behavior: Behavior normal.      Musculoskeletal Exam: C-spine limited ROM.  Thoracic kyphosis noted.  Difficult to assess ROM of lumbar spine while in wheelchair.  Right shoulder good ROM.  Left shoulder limited abduction to about 90 degrees.  Elbow joints, wrist joints, MCPs, PIPs, and DIPs good ROM with no synovitis.  Knee joints, ankle joints, MTPs, PIPs, and DIPs good ROM with no synovitis.  No warmth or effusion of knee joints.  No tenderness or swelling of ankle joints.   CDAI Exam: CDAI Score: - Patient Global: -; Provider Global: - Swollen: -; Tender: - Joint Exam   No joint exam has been documented for this visit   There is currently no information documented on the homunculus. Go to  the Rheumatology activity and complete the homunculus joint exam.  Investigation: No additional findings.  Imaging: No results found.  Recent Labs: Lab Results  Component Value Date   WBC 10.3 07/15/2019   HGB 12.1 07/15/2019   PLT 353 07/15/2019   NA 142 07/15/2019   K 4.5 07/15/2019   CL 99 07/15/2019   CO2 30 07/15/2019   GLUCOSE 81 07/15/2019   BUN 33 (H) 07/15/2019   CREATININE 1.57 (H) 07/15/2019   BILITOT 0.4 07/15/2019   ALKPHOS 68 05/01/2018   AST 22 07/15/2019   ALT 19 07/15/2019   PROT 6.9 07/15/2019   ALBUMIN 3.0 (L) 05/01/2018   CALCIUM 9.4 07/15/2019   GFRAA 38 (L) 07/15/2019    Speciality Comments: PLQ Eye Exam: 12/13/17 WNl @ [facility]  Procedures:  No procedures performed Allergies: Oxycontin [oxycodone hcl], Sulfa antibiotics, Tramadol, Penicillins, and Sulfasalazine   Assessment / Plan:     Visit Diagnoses: Temporal arteritis ([place]): She has no temporal artery tenderness on  exam.  She has not had any recent headaches or blurry vision recently.  She continues to take prednisone 8 mg by mouth daily.  She was advised to notify us if she develops any new or worsening symptoms.  Polymyalgia rheumatica ([place]):  She is not having any increased muscle aches, muscle tenderness, or muscle weakness at this time.  She remains primarily in the wheelchair, but she has been trying to work on muscle strengthening and mobility exercises with assistance.  Sed rate was 86 on 07/15/19.  She was advised to increase prednisone from 6 mg to 8 mg daily at that time.  She has been clinically stable on prednisone 8 mg daily. She continues to notice weight gain and is concerned about the long term side effects of prednisone.  We discussed starting her on a steroid sparing agent, such as Sublimity.  Indications, contraindications, and potential side effects of Arava were discussed in detail.  All questions were addressed with the patient and her son (over the phone).  She will require baseline immunosuppressive labs prior to starting on Arava.  She will start taking Arava 10 mg po daily for 2 weeks and if labs are stable at that time she will increase to arava 20 mg po daily.  She was advised to notify us if she cannot tolerate Arava.  She will continue on prednisone 8 mg po daily to allow time for arava to work.  She will follow up in 6 weeks.   Medication counseling:  Patient was counseled on the purpose, proper use, and adverse effects of leflunomide including risk of infection, nausea/diarrhea/weight loss, increase in blood pressure, rash, hair loss, tingling in the hands and feet, and signs and symptoms of interstitial lung disease.   Also counseled on Black Box warning of liver injury and importance of avoiding alcohol while on therapy. Discussed that there is the possibility of an increased risk of malignancy but it is not well understood if this increased risk is due to the medication or the disease state.   Counseled patient to avoid live vaccines. Recommend annual influenza, Pneumovax 23, Prevnar 13, and Shingrix as indicated.   Discussed the importance of frequent monitoring of liver function and blood count.  Standing orders placed.  Provided patient with educational materials on leflunomide and answered all questions.  Patient consented to [place] use, and consent will be uploaded into the media tab.   Patient dose will be 10 mg for 2 weeks and she will  increase to 20 mg daily if labs are stable in 2 weeks.  Prescription pending lab results and/or insurance approval.  High risk medication use: We discussed starting her on Arava 10 mg by mouth daily for 2 weeks and if her labs are stable at that time she will increase to Arava 20 mg by mouth daily.  She will have lab work drawn in 2 weeks x 2 then 2 months then every 3 months.  She will have her labs drawn at home by her home health nurse.  On prednisone therapy - Prednisone 8 mg po daily.   Rheumatoid factor positive: She has no obvious synovitis on exam.   Positive anti-CCP test: She has no synovitis on exam.   Chronic pain syndrome  Primary osteoarthritis of both knees: She has chronic pain in both knee joints.  No warmth or effusion of knee joints noted.   DDD (degenerative disc disease), lumbar: She has chronic lower back pain. She has been having left sided radiculopathy.  She is going to reach out to her PCP to discuss switching from flexeril to Robaxin since she has excessive drowsiness after taking flexeril.  She is followed by Dr. Redmond Pulling.   Spinal stenosis, thoracic: She has no midline spinal tenderness. She has thoracic kyphosis on exam.   Other medical conditions are listed as follows:   Vitamin D deficiency  History of hypertension  History of gastroesophageal reflux (GERD)  Memory loss  History of diverticulitis  Orders: No orders of the defined types were placed in this encounter.  No orders of the defined types were  placed in this encounter.   Face-to-face time spent with patient was 60 minutes. Greater than 50% of time was spent in counseling and coordination of care.  Follow-Up Instructions: Return in about 6 weeks (around 09/29/2019) for Polymyalgia Rheumatica, Osteoarthritis.   Ofilia Neas, PA-C   I examined and evaluated the patient with Hazel Sams PA.  Discussion with patient and her son who was on the phone regarding revising steroid sparing agent.  Indications side effects contraindications were discussed at length.  Monitoring of medication was also discussed.  We will have labs done at her home.  Once the lab results are available she will be starting Arava 10 mg p.o. daily.  It can be increased to 20 mg p.o. daily after 2 weeks if the labs are stable.  Monitoring of the labs were also discussed.  The plan of care was discussed as noted above.  Bo Merino, MD  Note - This record has been created using Editor, commissioning.  Chart creation errors have been sought, but may not always  have been located. Such creation errors do not reflect on  the standard of medical care.

## 2019-08-07 DIAGNOSIS — N183 Chronic kidney disease, stage 3 (moderate): Secondary | ICD-10-CM | POA: Diagnosis not present

## 2019-08-07 DIAGNOSIS — M48062 Spinal stenosis, lumbar region with neurogenic claudication: Secondary | ICD-10-CM | POA: Diagnosis not present

## 2019-08-07 DIAGNOSIS — I130 Hypertensive heart and chronic kidney disease with heart failure and stage 1 through stage 4 chronic kidney disease, or unspecified chronic kidney disease: Secondary | ICD-10-CM | POA: Diagnosis not present

## 2019-08-07 DIAGNOSIS — G894 Chronic pain syndrome: Secondary | ICD-10-CM | POA: Diagnosis not present

## 2019-08-07 DIAGNOSIS — M961 Postlaminectomy syndrome, not elsewhere classified: Secondary | ICD-10-CM | POA: Diagnosis not present

## 2019-08-07 DIAGNOSIS — I5042 Chronic combined systolic (congestive) and diastolic (congestive) heart failure: Secondary | ICD-10-CM | POA: Diagnosis not present

## 2019-08-12 DIAGNOSIS — R262 Difficulty in walking, not elsewhere classified: Secondary | ICD-10-CM | POA: Diagnosis not present

## 2019-08-12 DIAGNOSIS — G894 Chronic pain syndrome: Secondary | ICD-10-CM | POA: Diagnosis not present

## 2019-08-12 DIAGNOSIS — I5042 Chronic combined systolic (congestive) and diastolic (congestive) heart failure: Secondary | ICD-10-CM | POA: Diagnosis not present

## 2019-08-12 DIAGNOSIS — M48062 Spinal stenosis, lumbar region with neurogenic claudication: Secondary | ICD-10-CM | POA: Diagnosis not present

## 2019-08-12 DIAGNOSIS — M961 Postlaminectomy syndrome, not elsewhere classified: Secondary | ICD-10-CM | POA: Diagnosis not present

## 2019-08-12 DIAGNOSIS — M6281 Muscle weakness (generalized): Secondary | ICD-10-CM | POA: Diagnosis not present

## 2019-08-12 DIAGNOSIS — Z6841 Body Mass Index (BMI) 40.0 and over, adult: Secondary | ICD-10-CM | POA: Diagnosis not present

## 2019-08-12 DIAGNOSIS — D631 Anemia in chronic kidney disease: Secondary | ICD-10-CM | POA: Diagnosis not present

## 2019-08-12 DIAGNOSIS — I130 Hypertensive heart and chronic kidney disease with heart failure and stage 1 through stage 4 chronic kidney disease, or unspecified chronic kidney disease: Secondary | ICD-10-CM | POA: Diagnosis not present

## 2019-08-12 DIAGNOSIS — M353 Polymyalgia rheumatica: Secondary | ICD-10-CM | POA: Diagnosis not present

## 2019-08-13 ENCOUNTER — Telehealth: Payer: Self-pay | Admitting: Rheumatology

## 2019-08-13 NOTE — Telephone Encounter (Signed)
Spoke with patient and she has decided to keep her appointment as an in office appointment.

## 2019-08-13 NOTE — Telephone Encounter (Signed)
Ok to schedule virtual visit and send lab orders to the home health nurse.

## 2019-08-13 NOTE — Telephone Encounter (Signed)
Patient called stating she is scheduled for an appointment to discuss treatment options and to check her SED rate on Monday 08/18/19.  Patient states [facility] will send a nurse to her home to do labwork if Dr. Estanislado Pandy sends a request to [employer], phone [phone].   Patient states if she is able to have labwork at her home she can schedule a virtual appointment with Dr. Estanislado Pandy to discuss the results.  Patient requested a return call.

## 2019-08-14 DIAGNOSIS — G894 Chronic pain syndrome: Secondary | ICD-10-CM | POA: Diagnosis not present

## 2019-08-14 DIAGNOSIS — Z9889 Other specified postprocedural states: Secondary | ICD-10-CM | POA: Diagnosis not present

## 2019-08-14 DIAGNOSIS — M48061 Spinal stenosis, lumbar region without neurogenic claudication: Secondary | ICD-10-CM | POA: Diagnosis not present

## 2019-08-14 DIAGNOSIS — M48062 Spinal stenosis, lumbar region with neurogenic claudication: Secondary | ICD-10-CM | POA: Diagnosis not present

## 2019-08-14 DIAGNOSIS — M961 Postlaminectomy syndrome, not elsewhere classified: Secondary | ICD-10-CM | POA: Diagnosis not present

## 2019-08-14 DIAGNOSIS — M438X5 Other specified deforming dorsopathies, thoracolumbar region: Secondary | ICD-10-CM | POA: Diagnosis not present

## 2019-08-14 DIAGNOSIS — I130 Hypertensive heart and chronic kidney disease with heart failure and stage 1 through stage 4 chronic kidney disease, or unspecified chronic kidney disease: Secondary | ICD-10-CM | POA: Diagnosis not present

## 2019-08-14 DIAGNOSIS — G8929 Other chronic pain: Secondary | ICD-10-CM | POA: Diagnosis not present

## 2019-08-14 DIAGNOSIS — M545 Low back pain: Secondary | ICD-10-CM | POA: Diagnosis not present

## 2019-08-14 DIAGNOSIS — M5137 Other intervertebral disc degeneration, lumbosacral region: Secondary | ICD-10-CM | POA: Diagnosis not present

## 2019-08-18 ENCOUNTER — Encounter: Payer: Self-pay | Admitting: Physician Assistant

## 2019-08-18 ENCOUNTER — Ambulatory Visit (INDEPENDENT_AMBULATORY_CARE_PROVIDER_SITE_OTHER): Payer: Medicare Other | Admitting: Physician Assistant

## 2019-08-18 ENCOUNTER — Other Ambulatory Visit: Payer: Self-pay

## 2019-08-18 ENCOUNTER — Telehealth: Payer: Self-pay | Admitting: Pharmacist

## 2019-08-18 VITALS — BP 133/77 | HR 77 | Resp 15 | Ht 60.0 in | Wt 266.4 lb

## 2019-08-18 DIAGNOSIS — M5136 Other intervertebral disc degeneration, lumbar region: Secondary | ICD-10-CM | POA: Diagnosis not present

## 2019-08-18 DIAGNOSIS — R413 Other amnesia: Secondary | ICD-10-CM

## 2019-08-18 DIAGNOSIS — M4804 Spinal stenosis, thoracic region: Secondary | ICD-10-CM

## 2019-08-18 DIAGNOSIS — Z79899 Other long term (current) drug therapy: Secondary | ICD-10-CM

## 2019-08-18 DIAGNOSIS — E559 Vitamin D deficiency, unspecified: Secondary | ICD-10-CM

## 2019-08-18 DIAGNOSIS — R768 Other specified abnormal immunological findings in serum: Secondary | ICD-10-CM | POA: Diagnosis not present

## 2019-08-18 DIAGNOSIS — M353 Polymyalgia rheumatica: Secondary | ICD-10-CM

## 2019-08-18 DIAGNOSIS — R7689 Other specified abnormal immunological findings in serum: Secondary | ICD-10-CM

## 2019-08-18 DIAGNOSIS — M170 Bilateral primary osteoarthritis of knee: Secondary | ICD-10-CM

## 2019-08-18 DIAGNOSIS — Z8679 Personal history of other diseases of the circulatory system: Secondary | ICD-10-CM

## 2019-08-18 DIAGNOSIS — M316 Other giant cell arteritis: Secondary | ICD-10-CM | POA: Diagnosis not present

## 2019-08-18 DIAGNOSIS — Z8719 Personal history of other diseases of the digestive system: Secondary | ICD-10-CM | POA: Diagnosis not present

## 2019-08-18 DIAGNOSIS — M51369 Other intervertebral disc degeneration, lumbar region without mention of lumbar back pain or lower extremity pain: Secondary | ICD-10-CM

## 2019-08-18 DIAGNOSIS — Z7952 Long term (current) use of systemic steroids: Secondary | ICD-10-CM | POA: Diagnosis not present

## 2019-08-18 DIAGNOSIS — G894 Chronic pain syndrome: Secondary | ICD-10-CM | POA: Diagnosis not present

## 2019-08-18 DIAGNOSIS — R7681 Abnormal rheumatoid factor and anti-citrullinated protein antibody without rheumatoid arthritis: Secondary | ICD-10-CM

## 2019-08-18 NOTE — Progress Notes (Signed)
Pharmacy Note  Subjective: Patient presents today to the [facility] to see Dr. Estanislado Pandy.  Patient seen by the pharmacist for counseling on leflunomide Jolee Ewing) for PMR.  Her son [name] was also present via the phone.  Objective: CBC    Component Value Date/Time   WBC 10.3 07/15/2019 1221   RBC 4.06 07/15/2019 1221   HGB 12.1 07/15/2019 1221   HGB 12.2 12/18/2017 1247   HCT 36.1 07/15/2019 1221   HCT 35.6 12/18/2017 1247   PLT 353 07/15/2019 1221   PLT 295 12/18/2017 1247   MCV 88.9 07/15/2019 1221   MCV 84 12/18/2017 1247   MCH 29.8 07/15/2019 1221   MCHC 33.5 07/15/2019 1221   RDW 14.8 07/15/2019 1221   RDW 15.1 12/18/2017 1247   LYMPHSABS 2,771 07/15/2019 1221   LYMPHSABS 3.0 12/18/2017 1247   MONOABS 1.1 (H) 05/01/2018 0619   EOSABS 319 07/15/2019 1221   EOSABS 0.3 12/18/2017 1247   BASOSABS 41 07/15/2019 1221   BASOSABS 0.0 12/18/2017 1247    CMP     Component Value Date/Time   NA 142 07/15/2019 1221   NA 141 12/18/2017 1247   K 4.5 07/15/2019 1221   CL 99 07/15/2019 1221   CO2 30 07/15/2019 1221   GLUCOSE 81 07/15/2019 1221   BUN 33 (H) 07/15/2019 1221   BUN 16 12/18/2017 1247   CREATININE 1.57 (H) 07/15/2019 1221   CALCIUM 9.4 07/15/2019 1221   PROT 6.9 07/15/2019 1221   PROT 7.2 12/18/2017 1247   ALBUMIN 3.0 (L) 05/01/2018 0619   ALBUMIN 4.2 12/18/2017 1247   AST 22 07/15/2019 1221   ALT 19 07/15/2019 1221   ALKPHOS 68 05/01/2018 0619   BILITOT 0.4 07/15/2019 1221   BILITOT 0.4 12/18/2017 1247   GFRNONAA 32 (L) 07/15/2019 1221   GFRAA 38 (L) 07/15/2019 1221    Baseline Immunosuppressant Therapy Labs  TB Gold: pending 08/18/2019 HIV: pending 08/18/2019 Hepatitis panel: pending 08/18/2019 Immunoglobulins: pending 08/18/2019 SPEP: pending 08/18/2019  Pregnancy status:  post-menopausal  Chest x-ray: Cardiomegaly with mild bilateral pulmonary interstitial prominence suggesting interstitial edema/CHF on  04/16/2018  Assessment/Plan:  Patient was counseled on the purpose, proper use, and adverse effects of leflunomide including risk of infection, nausea/diarrhea/weight loss, increase in blood pressure, rash, hair loss, tingling in the hands and feet, and signs and symptoms of interstitial lung disease.   Also counseled on Black Box warning of liver injury and importance of avoiding alcohol while on therapy. Discussed that there is the possibility of an increased risk of malignancy but it is not well understood if this increased risk is due to the medication or the disease state.  Counseled patient to avoid live vaccines. Recommend annual influenza, Pneumovax 23, Prevnar 13, and Shingrix as indicated.   Discussed the importance of frequent monitoring of liver function and blood count.  Standing orders placed.  Discussed importance of birth control while on leflunomide due to risk of congenital abnormalities, and patient confirms post-menopausal.  Provided patient with educational materials on leflunomide and answered all questions.  Patient consented to [place] use, and consent will be uploaded into the media tab.    Patient dose will be 10 mg daily for 2 weeks and then increase to 20 mg daily.  Prescription pending lab results and/or insurance approval.  All questions encouraged and answered.  Instructed patient/caregiver to call with any questions or concerns.  Mariella Saa, PharmD, [place], [facility] Pharmacist [phone]  08/18/2019 12:52 PM

## 2019-08-18 NOTE — Telephone Encounter (Signed)
Arava dose will be 10 mg daily for 2 weeks then increase to 20 mg daily.  Pending lab results from [facility].

## 2019-08-18 NOTE — Progress Notes (Addendum)
Called [facility] services and fax number for Donley Redder is [phone].   Orders have been faxed.

## 2019-08-18 NOTE — Patient Instructions (Addendum)
Take Arava 10 mg 1 tablet by mouth daily for 2 weeks.  Have labs drawn in 2 weeks.  If labs are stable, increase to Arava 10 mg 2 tablets daily. Have lab work drawn 2 weeks after increasing the dose of [employer] We placed an order today for your standing lab work.    Please come back and get your standing labs in 2 weeks x2, then 2 months, then every 3 months   We have open lab daily Monday through Thursday from 8:30-12:30 PM and 1:30-4:30 PM and Friday from 8:30-12:30 PM and 1:30-4:00 PM at the office of Dr. Bo Merino.   You may experience shorter wait times on Monday and Friday afternoons. The office is located at [address], [place], [place], [place] [address] No appointment is necessary.   Labs are drawn by [facility].  You may receive a bill from [facility] for your lab work.  If you wish to have your labs drawn at another location, please call the office 24 hours in advance to send orders.  If you have any questions regarding directions or hours of operation,  please call [phone].   Just as a reminder please drink plenty of water prior to coming for your lab work. Thanks!  Vaccines You are taking a medication(s) that can suppress your immune system.  The following immunizations are recommended: . Flu annually . Pneumonia (Pneumovax 23 and Prevnar 13 spaced at least 1 year apart) . Shingrix  Please check with your PCP to make sure you are up to date.  Leflunomide tablets What is this medicine? LEFLUNOMIDE (le FLOO na mide) is for rheumatoid arthritis. This medicine may be used for other purposes; ask your health care provider or pharmacist if you have questions. COMMON BRAND NAME(S): Arava What should I tell my health care provider before I take this medicine? They need to know if you have any of these conditions:  diabetes  have a fever or infection  high blood pressure  immune system problems  kidney disease  liver disease  low blood cell  counts, like low white cell, platelet, or red cell counts  lung or breathing disease, like asthma  recently received or scheduled to receive a vaccine  receiving treatment for cancer  skin conditions or sensitivity  tingling of the fingers or toes, or other nerve disorder  tuberculosis  an unusual or allergic reaction to leflunomide, teriflunomide, other medicines, food, dyes, or preservatives  pregnant or trying to get pregnant  breast-feeding How should I use this medicine? Take this medicine by mouth with a full glass of water. Follow the directions on the prescription label. Take your medicine at regular intervals. Do not take your medicine more often than directed. Do not stop taking except on your doctor's advice. Talk to your pediatrician regarding the use of this medicine in children. Special care may be needed. Overdosage: If you think you have taken too much of this medicine contact a poison control center or emergency room at once. NOTE: This medicine is only for you. Do not share this medicine with others. What if I miss a dose? If you miss a dose, take it as soon as you can. If it is almost time for your next dose, take only that dose. Do not take double or extra doses. What may interact with this medicine? Do not take this medicine with any of the following medications:  teriflunomide This medicine may also interact with the following medications:  alosetron  birth control pills  caffeine  cefaclor  certain medicines for diabetes like nateglinide, repaglinide, rosiglitazone, pioglitazone  certain medicines for high cholesterol like atorvastatin, pravastatin, rosuvastatin, simvastatin  charcoal  cholestyramine  ciprofloxacin  duloxetine  furosemide  ketoprofen  live virus vaccines  medicines that increase your risk for infection  methotrexate  mitoxantrone  paclitaxel  penicillin  theophylline  tizanidine  warfarin This list may  not describe all possible interactions. Give your health care provider a list of all the medicines, herbs, non-prescription drugs, or dietary supplements you use. Also tell them if you smoke, drink alcohol, or use illegal drugs. Some items may interact with your medicine. What should I watch for while using this medicine? Visit your health care provider for regular checks on your progress. Tell your doctor or health care provider if your symptoms do not start to get better or if they get worse. You may need blood work done while you are taking this medicine. This medicine may cause serious skin reactions. They can happen weeks to months after starting the medicine. Contact your health care provider right away if you notice fevers or flu-like symptoms with a rash. The rash may be red or purple and then turn into blisters or peeling of the skin. Or, you might notice a red rash with swelling of the face, lips or lymph nodes in your neck or under your arms. This medicine may stay in your body for up to 2 years after your last dose. Tell your doctor about any unusual side effects or symptoms. A medicine can be given to help lower your blood levels of this medicine more quickly. Women must use effective birth control with this medicine. There is a potential for serious side effects to an unborn child. Do not become pregnant while taking this medicine. Inform your doctor if you wish to become pregnant. This medicine remains in your blood after you stop taking it. You must continue using effective birth control until the blood levels have been checked and they are low enough. A medicine can be given to help lower your blood levels of this medicine more quickly. Immediately talk to your doctor if you think you may be pregnant. You may need a pregnancy test. Talk to your health care provider or pharmacist for more information. You should not receive certain vaccines during your treatment and for a certain time after  your treatment with this medication ends. Talk to your health care provider for more information. What side effects may I notice from receiving this medicine? Side effects that you should report to your doctor or health care professional as soon as possible:  allergic reactions like skin rash, itching or hives, swelling of the face, lips, or tongue  breathing problems  cough  increased blood pressure  low blood counts - this medicine may decrease the number of white blood cells and platelets. You may be at increased risk for infections and bleeding.  pain, tingling, numbness in the hands or feet  rash, fever, and swollen lymph nodes  redness, blistering, peeing or loosening of the skin, including inside the mouth  signs of decreased platelets or bleeding - bruising, pinpoint red spots on the skin, black, tarry stools, blood in urine  signs of infection - fever or chills, cough, sore throat, pain or trouble passing urine  signs and symptoms of liver injury like dark yellow or brown urine; general ill feeling or flu-like symptoms; light-colored stools; loss of appetite; nausea; right upper  belly pain; unusually weak or tired; yellowing of the eyes or skin  trouble passing urine or change in the amount of urine  vomiting Side effects that usually do not require medical attention (report to your doctor or health care professional if they continue or are bothersome):  diarrhea  hair thinning or loss  headache  nausea  tiredness This list may not describe all possible side effects. Call your doctor for medical advice about side effects. You may report side effects to FDA at 1-800-FDA-1088. Where should I keep my medicine? Keep out of the reach of children. Store at room temperature between 15 and 30 degrees C (59 and 86 degrees F). Protect from moisture and light. Throw away any unused medicine after the expiration date. NOTE: This sheet is a summary. It may not cover all  possible information. If you have questions about this medicine, talk to your doctor, pharmacist, or health care provider.  2020 Elsevier/Gold Standard (2019-01-10 15:06:48)

## 2019-08-25 ENCOUNTER — Other Ambulatory Visit: Payer: Self-pay | Admitting: Rheumatology

## 2019-08-25 NOTE — Telephone Encounter (Signed)
Last Visit: 08/18/19 Next Visit: 09/30/19  Okay to refill per Dr. Estanislado Pandy

## 2019-08-27 DIAGNOSIS — R252 Cramp and spasm: Secondary | ICD-10-CM | POA: Diagnosis not present

## 2019-08-27 DIAGNOSIS — M150 Primary generalized (osteo)arthritis: Secondary | ICD-10-CM | POA: Diagnosis not present

## 2019-08-29 ENCOUNTER — Telehealth: Payer: Self-pay | Admitting: Rheumatology

## 2019-08-29 NOTE — Telephone Encounter (Signed)
Patient left a voicemail stating due to transportation problems was not able to have her labwork.  Patient states "if she is able to get transportation she might be able to go to [facility] in [place]."

## 2019-08-29 NOTE — Telephone Encounter (Signed)
Spoke with patient about labs. [facility] was unable to draw labs due to one of them being a frozen specimen. Patient has been unable to go get labs due to transportation issues. Patient states she may be able to get labs done at [facility] next week. I advised patient to call us before going so we could ensure orders are placed for the correct lab. Patient verbalized understanding.

## 2019-09-08 ENCOUNTER — Other Ambulatory Visit: Payer: Self-pay | Admitting: Rheumatology

## 2019-09-08 NOTE — Telephone Encounter (Signed)
Last Visit: 08/18/19 Next Visit: 09/30/19  Okay to refill per Dr. Estanislado Pandy

## 2019-09-11 DIAGNOSIS — M961 Postlaminectomy syndrome, not elsewhere classified: Secondary | ICD-10-CM | POA: Diagnosis not present

## 2019-09-11 DIAGNOSIS — M353 Polymyalgia rheumatica: Secondary | ICD-10-CM | POA: Diagnosis not present

## 2019-09-11 DIAGNOSIS — R262 Difficulty in walking, not elsewhere classified: Secondary | ICD-10-CM | POA: Diagnosis not present

## 2019-09-11 DIAGNOSIS — M6281 Muscle weakness (generalized): Secondary | ICD-10-CM | POA: Diagnosis not present

## 2019-09-11 DIAGNOSIS — M48062 Spinal stenosis, lumbar region with neurogenic claudication: Secondary | ICD-10-CM | POA: Diagnosis not present

## 2019-09-11 DIAGNOSIS — Z6841 Body Mass Index (BMI) 40.0 and over, adult: Secondary | ICD-10-CM | POA: Diagnosis not present

## 2019-09-11 DIAGNOSIS — I130 Hypertensive heart and chronic kidney disease with heart failure and stage 1 through stage 4 chronic kidney disease, or unspecified chronic kidney disease: Secondary | ICD-10-CM | POA: Diagnosis not present

## 2019-09-11 DIAGNOSIS — D631 Anemia in chronic kidney disease: Secondary | ICD-10-CM | POA: Diagnosis not present

## 2019-09-11 DIAGNOSIS — I5042 Chronic combined systolic (congestive) and diastolic (congestive) heart failure: Secondary | ICD-10-CM | POA: Diagnosis not present

## 2019-09-11 DIAGNOSIS — G894 Chronic pain syndrome: Secondary | ICD-10-CM | POA: Diagnosis not present

## 2019-09-11 NOTE — Telephone Encounter (Signed)
Patient advised we are still awaiting labs before we can start her on Arava. Patient states she is having trouble with transportation.

## 2019-09-12 ENCOUNTER — Other Ambulatory Visit: Payer: Self-pay | Admitting: Physician Assistant

## 2019-09-12 DIAGNOSIS — Z79899 Other long term (current) drug therapy: Secondary | ICD-10-CM | POA: Diagnosis not present

## 2019-09-13 LAB — CMP14+EGFR
ALT: 31 IU/L (ref 0–32)
AST: 24 IU/L (ref 0–40)
Albumin/Globulin Ratio: 1.5 (ref 1.2–2.2)
Albumin: 4.3 g/dL (ref 3.7–4.7)
Alkaline Phosphatase: 153 IU/L — ABNORMAL HIGH (ref 39–117)
BUN/Creatinine Ratio: 14 (ref 12–28)
BUN: 23 mg/dL (ref 8–27)
Bilirubin Total: 0.2 mg/dL (ref 0.0–1.2)
CO2: 28 mmol/L (ref 20–29)
Calcium: 9.9 mg/dL (ref 8.7–10.3)
Chloride: 99 mmol/L (ref 96–106)
Creatinine, Ser: 1.64 mg/dL — ABNORMAL HIGH (ref 0.57–1.00)
GFR calc Af Amer: 36 mL/min/{1.73_m2} — ABNORMAL LOW (ref >59)
GFR calc non Af Amer: 31 mL/min/{1.73_m2} — ABNORMAL LOW (ref >59)
Globulin, Total: 2.8 g/dL (ref 1.5–4.5)
Glucose: 97 mg/dL (ref 65–99)
Potassium: 4.2 mmol/L (ref 3.5–5.2)
Sodium: 144 mmol/L (ref 134–144)
Total Protein: 7.1 g/dL (ref 6.0–8.5)

## 2019-09-13 LAB — CBC WITH DIFFERENTIAL/PLATELET

## 2019-09-15 NOTE — Progress Notes (Signed)
CBC was canceled due to not obtaining a suitable specimen.   Creatinine is elevated. GFR is low-36.  Alk phos is elevated-153.  Please notify patient.  Dr. Estanislado Pandy would like to order a total body bone scan to further assess due to elevated sed rate and elevated alk phos.

## 2019-09-16 ENCOUNTER — Other Ambulatory Visit: Payer: Self-pay

## 2019-09-16 DIAGNOSIS — R700 Elevated erythrocyte sedimentation rate: Secondary | ICD-10-CM

## 2019-09-17 MED ORDER — LEFLUNOMIDE 10 MG PO TABS: ORAL_TABLET | ORAL | 0 refills | Status: DC

## 2019-09-17 NOTE — Telephone Encounter (Signed)
Spoke with patient and advised patient that prescription has been sent to the pharmacy and lab orders have been faxed to [facility]. Patient verbalized understanding.

## 2019-09-17 NOTE — Telephone Encounter (Signed)
Labs 09/12/2019 CBC was canceled due to not obtaining a suitable specimen.   Creatinine is elevated. GFR is low-36. Alk phos is elevated-153. Please notify patient. Dr. Estanislado Pandy would like to order a total body bone scan to further assess due to elevated sed rate and elevated alk phos.  Can patient proceed with starting [place] at this point?

## 2019-09-17 NOTE — Addendum Note (Signed)
Addended by: Earnestine Mealing on: 09/17/2019 02:39 PM   Modules accepted: Orders

## 2019-09-17 NOTE — Telephone Encounter (Signed)
OK to start on [place]. She should have standing orders in 2 weeks and 2 months.

## 2019-09-22 ENCOUNTER — Other Ambulatory Visit: Payer: Self-pay | Admitting: Rheumatology

## 2019-09-22 DIAGNOSIS — I110 Hypertensive heart disease with heart failure: Secondary | ICD-10-CM | POA: Diagnosis not present

## 2019-09-22 DIAGNOSIS — M353 Polymyalgia rheumatica: Secondary | ICD-10-CM | POA: Diagnosis not present

## 2019-09-22 DIAGNOSIS — I5032 Chronic diastolic (congestive) heart failure: Secondary | ICD-10-CM | POA: Diagnosis not present

## 2019-09-22 DIAGNOSIS — M069 Rheumatoid arthritis, unspecified: Secondary | ICD-10-CM | POA: Diagnosis not present

## 2019-09-22 NOTE — Telephone Encounter (Signed)
Can you please check on this patients total body bone scan? Thank you.

## 2019-09-22 NOTE — Telephone Encounter (Signed)
Ok to refill prednisone 

## 2019-09-22 NOTE — Telephone Encounter (Signed)
Notes recorded by Earnestine Mealing, CMA on 09/16/2019 at 10:50 AM EST  Advised patient CBC was canceled due to not obtaining a suitable specimen. Creatinine is elevated. GFR is low-36. Alk phos is elevated-153.patient would like to proceed with a total body bone scan to further assess due to elevated sed rate and elevated alk phos.  ------   TBBS ordered Last refill 08/25/2019 Last visit 08/18/2019 Next visit 12/23/2019

## 2019-09-23 DIAGNOSIS — Z79891 Long term (current) use of opiate analgesic: Secondary | ICD-10-CM | POA: Diagnosis not present

## 2019-09-23 DIAGNOSIS — G894 Chronic pain syndrome: Secondary | ICD-10-CM | POA: Diagnosis not present

## 2019-09-23 DIAGNOSIS — M961 Postlaminectomy syndrome, not elsewhere classified: Secondary | ICD-10-CM | POA: Diagnosis not present

## 2019-09-24 NOTE — Telephone Encounter (Signed)
Patient has Medicare, and [employer] for [employer] which does not require PA for the Bone Scan. I contacted patient's son 09/22/19 to advise him patient was okay to be scheduled, and gave him [facility]'s phone number to do so.

## 2019-09-25 NOTE — Progress Notes (Signed)
Virtual Visit via Telephone Note  I connected with Nicole [name] on 09/26/19 at  9:15 AM EST by telephone and verified that I am speaking with the correct person using two identifiers.  Location: Patient: Home  Provider: Clinic  This service was conducted via virtual visit.  The patient was located at home. I was located in my office.  Consent was obtained prior to the virtual visit and is aware of possible charges through their insurance for this visit.  The patient is an established patient.  Dr. Estanislado Pandy, MD conducted the virtual visit and Hazel Sams, PA-C acted as scribe during the service.  Office staff helped with scheduling follow up visits after the service was conducted.   I discussed the limitations, risks, security and privacy concerns of performing an evaluation and management service by telephone and the availability of in person appointments. I also discussed with the patient that there may be a patient responsible charge related to this service. The patient expressed understanding and agreed to proceed.  CC: Discuss medications  History of Present Illness: Patient is a 73 year old female with a past medical history of temporal arteritis, PMR, and osteoarthritis.  She is taking prednisone 8 mg po daily and started on Arava 10 mg 1 tablet daily yesterday and will be increasing to 20 mg in 2 weeks. She is tolerating Arava without any side effects.  She denies any headaches or temporal artery tenderness.  She denies any increased muscle aches or tenderness.    Review of Systems  Constitutional: Positive for malaise/fatigue. Negative for fever.  HENT:       +Dry mouth  Eyes: Negative for photophobia, pain, discharge and redness.  Respiratory: Negative for cough, shortness of breath and wheezing.   Cardiovascular: Negative for chest pain and palpitations.  Gastrointestinal: Negative for blood in stool, constipation and diarrhea.  Genitourinary: Negative for dysuria.   Musculoskeletal: Positive for back pain and myalgias. Negative for joint pain and neck pain.  Skin: Negative for rash.  Neurological: Negative for dizziness and headaches.  Psychiatric/Behavioral: Negative for depression. The patient is not nervous/anxious and does not have insomnia.       Observations/Objective: Physical Exam  Constitutional: She is oriented to person, place, and time.  Neurological: She is alert and oriented to person, place, and time.  Psychiatric: Mood, memory, affect and judgment normal.   Patient reports morning stiffness for 30-60 minutes.   Patient denies nocturnal pain.  Difficulty dressing/grooming: Denies Difficulty climbing stairs: Reports Difficulty getting out of chair: Reports Difficulty using hands for taps, buttons, cutlery, and/or writing: Denies   Assessment and Plan: Visit Diagnoses: Temporal arteritis ([place]) -She has not had any recurrences. She has not had any recent headaches or temporal artery tenderness.  She is currently on prednisone 8 mg daily.  She was advised to notify us if she develops signs or symptoms of a flare.   Polymyalgia rheumatica ([place]) - She has not had any signs or symptoms of a flare since her last visit.  She continues to have generalized muscle fatigue and deconditioning in bilateral lower extremities but she has not noticed increased muscle tenderness or muscle weakness.  She started on Arava 10 mg 1 tablet daily yesterday.  She is tolerating Arava without any side effects.  She will be increasing to 20 mg po daily if labs are stable in 2 weeks. She will continue on prednisone 8 mg po daily and taper by 1 mg every month.    High risk  medication use -Prednisone 8 mg po daily.  She reduce prednisone to 7 mg po daily in 1 month.  She started on Arava 10 mg 1 tablet daily on 09/25/19.  She will return for lab work in 2 weeks and if labs are stable at that time she will increase to 20 mg po daily.  She is aware that she will  require labs in 2 weeks x2 then every 3 months.    On prednisone therapy - Prednisone 8 mg daily.  She is tapering by 1 mg every month.  DEXA order placed at the last visit.   Rheumatoid factor positive: She has no joint pain or joint swelling at this time.   Positive anti-CCP test: She has no clinical signs or symptoms of RA at this time.   Chronic pain syndrome: She continues have generalized pain.  She is on Nucynta and is taking it as prescribed. She was advised to avoid taking Diclofenac 75 mg BID due to elevated creatinine.  Her PCP is following her renal function.   Primary osteoarthritis of both knees: She has intermittent pain in both knee joints.  No joint swelling.   DDD (degenerative disc disease), lumbar: She has intermittent lower back pain.  She takes Nucynta for pain relief.   Spinal stenosis, thoracic: She has thoracic kyphosis.    Osteoporosis screening -DEXA 07/12/2017 right femoral neck BMD 0.843 with T score -0.8.  She has been on long-term prednisone.  She is currently on prednisone 8 mg by mouth daily.  She has not had any recent falls or fractures.  Updated DEXA ordered at last visit.   Other medical conditions are listed as follows:   Vitamin D deficiency  History of hypertension  History of gastroesophageal reflux (GERD)  Memory loss  History of diverticulitis   Follow Up Instructions: She will follow up in 6-8 weeks   I discussed the assessment and treatment plan with the patient. The patient was provided an opportunity to ask questions and all were answered. The patient agreed with the plan and demonstrated an understanding of the instructions.   The patient was advised to call back or seek an in-person evaluation if the symptoms worsen or if the condition fails to improve as anticipated.  I provided 15 minutes of non-face-to-face time during this encounter.   Bo Merino, MD   Scribed by-  Hazel Sams, PA-C

## 2019-09-26 ENCOUNTER — Encounter: Payer: Self-pay | Admitting: Rheumatology

## 2019-09-26 ENCOUNTER — Other Ambulatory Visit: Payer: Self-pay

## 2019-09-26 ENCOUNTER — Telehealth (INDEPENDENT_AMBULATORY_CARE_PROVIDER_SITE_OTHER): Payer: Medicare Other | Admitting: Rheumatology

## 2019-09-26 DIAGNOSIS — M316 Other giant cell arteritis: Secondary | ICD-10-CM | POA: Diagnosis not present

## 2019-09-26 DIAGNOSIS — M170 Bilateral primary osteoarthritis of knee: Secondary | ICD-10-CM | POA: Diagnosis not present

## 2019-09-26 DIAGNOSIS — Z8719 Personal history of other diseases of the digestive system: Secondary | ICD-10-CM

## 2019-09-26 DIAGNOSIS — M353 Polymyalgia rheumatica: Secondary | ICD-10-CM

## 2019-09-26 DIAGNOSIS — R413 Other amnesia: Secondary | ICD-10-CM

## 2019-09-26 DIAGNOSIS — M5136 Other intervertebral disc degeneration, lumbar region: Secondary | ICD-10-CM

## 2019-09-26 DIAGNOSIS — Z8679 Personal history of other diseases of the circulatory system: Secondary | ICD-10-CM

## 2019-09-26 DIAGNOSIS — Z7952 Long term (current) use of systemic steroids: Secondary | ICD-10-CM

## 2019-09-26 DIAGNOSIS — M4804 Spinal stenosis, thoracic region: Secondary | ICD-10-CM

## 2019-09-26 DIAGNOSIS — R768 Other specified abnormal immunological findings in serum: Secondary | ICD-10-CM | POA: Diagnosis not present

## 2019-09-26 DIAGNOSIS — E559 Vitamin D deficiency, unspecified: Secondary | ICD-10-CM | POA: Diagnosis not present

## 2019-09-26 DIAGNOSIS — G894 Chronic pain syndrome: Secondary | ICD-10-CM

## 2019-09-26 DIAGNOSIS — Z79899 Other long term (current) drug therapy: Secondary | ICD-10-CM | POA: Diagnosis not present

## 2019-09-26 DIAGNOSIS — M51369 Other intervertebral disc degeneration, lumbar region without mention of lumbar back pain or lower extremity pain: Secondary | ICD-10-CM

## 2019-09-29 ENCOUNTER — Other Ambulatory Visit: Payer: Self-pay | Admitting: Rheumatology

## 2019-09-29 NOTE — Telephone Encounter (Signed)
Last Visit:09/26/2019 telemedicine  Next Visit: message sent to the front desk to schedule.   Okay to refill per Dr.Deveshwar.

## 2019-09-30 ENCOUNTER — Telehealth: Payer: TRICARE For Life (TFL) | Admitting: Rheumatology

## 2019-10-03 DIAGNOSIS — B0229 Other postherpetic nervous system involvement: Secondary | ICD-10-CM | POA: Diagnosis not present

## 2019-10-03 DIAGNOSIS — M069 Rheumatoid arthritis, unspecified: Secondary | ICD-10-CM | POA: Diagnosis not present

## 2019-10-07 ENCOUNTER — Other Ambulatory Visit: Payer: Self-pay

## 2019-10-07 ENCOUNTER — Encounter (HOSPITAL_COMMUNITY)
Admission: RE | Admit: 2019-10-07 | Discharge: 2019-10-07 | Disposition: A | Discharge status: Home / Self Care | Payer: Medicare Other | Source: Ambulatory Visit | Attending: Rheumatology | Admitting: Rheumatology

## 2019-10-07 ENCOUNTER — Encounter (HOSPITAL_COMMUNITY): Payer: Self-pay

## 2019-10-07 DIAGNOSIS — R700 Elevated erythrocyte sedimentation rate: Secondary | ICD-10-CM | POA: Diagnosis not present

## 2019-10-07 HISTORY — DX: Systemic involvement of connective tissue, unspecified: M35.9

## 2019-10-07 HISTORY — DX: Heart failure, unspecified: I50.9

## 2019-10-07 MED ORDER — TECHNETIUM TC 99M MEDRONATE IV KIT
20.0000 | PACK | Freq: Once | INTRAVENOUS | Status: AC | PRN
  Administered 2019-10-07: 19.2 via INTRAVENOUS

## 2019-10-08 NOTE — Progress Notes (Signed)
Total body bone scan indicates only osteoarthritis and some joints.  No other abnormalities were noted.

## 2019-10-10 ENCOUNTER — Telehealth: Payer: Self-pay | Admitting: Rheumatology

## 2019-10-10 DIAGNOSIS — M316 Other giant cell arteritis: Secondary | ICD-10-CM

## 2019-10-10 DIAGNOSIS — M353 Polymyalgia rheumatica: Secondary | ICD-10-CM

## 2019-10-10 DIAGNOSIS — Z79899 Other long term (current) drug therapy: Secondary | ICD-10-CM

## 2019-10-10 NOTE — Telephone Encounter (Signed)
Patient called requesting her labwork orders be sent to [facility] at [address] in [place].  Patient states she will be going on Tuesday, 10/14/19.

## 2019-10-10 NOTE — Telephone Encounter (Signed)
Lab orders released for [facility].  

## 2019-10-10 NOTE — Addendum Note (Signed)
Addended by: Earnestine Mealing on: 10/10/2019 04:28 PM   Modules accepted: Orders

## 2019-10-11 DIAGNOSIS — M48062 Spinal stenosis, lumbar region with neurogenic claudication: Secondary | ICD-10-CM | POA: Diagnosis not present

## 2019-10-11 DIAGNOSIS — I5042 Chronic combined systolic (congestive) and diastolic (congestive) heart failure: Secondary | ICD-10-CM | POA: Diagnosis not present

## 2019-10-11 DIAGNOSIS — M961 Postlaminectomy syndrome, not elsewhere classified: Secondary | ICD-10-CM | POA: Diagnosis not present

## 2019-10-11 DIAGNOSIS — M6281 Muscle weakness (generalized): Secondary | ICD-10-CM | POA: Diagnosis not present

## 2019-10-11 DIAGNOSIS — D631 Anemia in chronic kidney disease: Secondary | ICD-10-CM | POA: Diagnosis not present

## 2019-10-11 DIAGNOSIS — Z6841 Body Mass Index (BMI) 40.0 and over, adult: Secondary | ICD-10-CM | POA: Diagnosis not present

## 2019-10-11 DIAGNOSIS — N183 Chronic kidney disease, stage 3 unspecified: Secondary | ICD-10-CM | POA: Diagnosis not present

## 2019-10-11 DIAGNOSIS — M353 Polymyalgia rheumatica: Secondary | ICD-10-CM | POA: Diagnosis not present

## 2019-10-11 DIAGNOSIS — R262 Difficulty in walking, not elsewhere classified: Secondary | ICD-10-CM | POA: Diagnosis not present

## 2019-10-11 DIAGNOSIS — I130 Hypertensive heart and chronic kidney disease with heart failure and stage 1 through stage 4 chronic kidney disease, or unspecified chronic kidney disease: Secondary | ICD-10-CM | POA: Diagnosis not present

## 2019-10-11 DIAGNOSIS — G894 Chronic pain syndrome: Secondary | ICD-10-CM | POA: Diagnosis not present

## 2019-10-13 ENCOUNTER — Telehealth: Payer: Self-pay | Admitting: Rheumatology

## 2019-10-13 NOTE — Telephone Encounter (Signed)
Attempted to contact Nicole [name], she will call back when she is available.

## 2019-10-13 NOTE — Telephone Encounter (Signed)
Chantelle from [facility] left a voicemail stating they faxed information regarding the order for August 19, 2019.  Please call back at [phone] to confirm it has been received.

## 2019-10-14 DIAGNOSIS — I130 Hypertensive heart and chronic kidney disease with heart failure and stage 1 through stage 4 chronic kidney disease, or unspecified chronic kidney disease: Secondary | ICD-10-CM | POA: Diagnosis not present

## 2019-10-14 DIAGNOSIS — M48062 Spinal stenosis, lumbar region with neurogenic claudication: Secondary | ICD-10-CM | POA: Diagnosis not present

## 2019-10-14 DIAGNOSIS — M961 Postlaminectomy syndrome, not elsewhere classified: Secondary | ICD-10-CM | POA: Diagnosis not present

## 2019-10-14 DIAGNOSIS — G894 Chronic pain syndrome: Secondary | ICD-10-CM | POA: Diagnosis not present

## 2019-10-14 DIAGNOSIS — N183 Chronic kidney disease, stage 3 unspecified: Secondary | ICD-10-CM | POA: Diagnosis not present

## 2019-10-14 DIAGNOSIS — I5042 Chronic combined systolic (congestive) and diastolic (congestive) heart failure: Secondary | ICD-10-CM | POA: Diagnosis not present

## 2019-10-20 ENCOUNTER — Other Ambulatory Visit: Payer: Self-pay | Admitting: Rheumatology

## 2019-10-20 ENCOUNTER — Ambulatory Visit: Payer: TRICARE For Life (TFL) | Admitting: Physician Assistant

## 2019-10-20 DIAGNOSIS — I5042 Chronic combined systolic (congestive) and diastolic (congestive) heart failure: Secondary | ICD-10-CM | POA: Diagnosis not present

## 2019-10-20 DIAGNOSIS — G894 Chronic pain syndrome: Secondary | ICD-10-CM | POA: Diagnosis not present

## 2019-10-20 DIAGNOSIS — I130 Hypertensive heart and chronic kidney disease with heart failure and stage 1 through stage 4 chronic kidney disease, or unspecified chronic kidney disease: Secondary | ICD-10-CM | POA: Diagnosis not present

## 2019-10-20 DIAGNOSIS — M961 Postlaminectomy syndrome, not elsewhere classified: Secondary | ICD-10-CM | POA: Diagnosis not present

## 2019-10-20 DIAGNOSIS — M48062 Spinal stenosis, lumbar region with neurogenic claudication: Secondary | ICD-10-CM | POA: Diagnosis not present

## 2019-10-20 DIAGNOSIS — N183 Chronic kidney disease, stage 3 unspecified: Secondary | ICD-10-CM | POA: Diagnosis not present

## 2019-10-21 ENCOUNTER — Other Ambulatory Visit: Payer: Self-pay | Admitting: Rheumatology

## 2019-10-21 ENCOUNTER — Telehealth: Payer: Self-pay | Admitting: Rheumatology

## 2019-10-21 NOTE — Telephone Encounter (Signed)
Patient left a voicemail stating she began taking Leflunomide about a month ago and has developed a rash under her arm and breast.  Patient is requesting a return call.

## 2019-10-21 NOTE — Telephone Encounter (Signed)
advised patient to stop taking arava, per [employer], PharmD. Patient denies any other symptoms, besides the rash. Patient states she is trying to get someone to take her to the lab to update lab work. Patient is scheduled for a virtual visit on 10/28/2019 to discuss treatment options.

## 2019-10-22 DIAGNOSIS — N183 Chronic kidney disease, stage 3 unspecified: Secondary | ICD-10-CM | POA: Diagnosis not present

## 2019-10-22 DIAGNOSIS — I5042 Chronic combined systolic (congestive) and diastolic (congestive) heart failure: Secondary | ICD-10-CM | POA: Diagnosis not present

## 2019-10-22 DIAGNOSIS — M48062 Spinal stenosis, lumbar region with neurogenic claudication: Secondary | ICD-10-CM | POA: Diagnosis not present

## 2019-10-22 DIAGNOSIS — G894 Chronic pain syndrome: Secondary | ICD-10-CM | POA: Diagnosis not present

## 2019-10-22 DIAGNOSIS — I130 Hypertensive heart and chronic kidney disease with heart failure and stage 1 through stage 4 chronic kidney disease, or unspecified chronic kidney disease: Secondary | ICD-10-CM | POA: Diagnosis not present

## 2019-10-22 DIAGNOSIS — M961 Postlaminectomy syndrome, not elsewhere classified: Secondary | ICD-10-CM | POA: Diagnosis not present

## 2019-10-23 DIAGNOSIS — I5032 Chronic diastolic (congestive) heart failure: Secondary | ICD-10-CM | POA: Diagnosis not present

## 2019-10-23 DIAGNOSIS — I130 Hypertensive heart and chronic kidney disease with heart failure and stage 1 through stage 4 chronic kidney disease, or unspecified chronic kidney disease: Secondary | ICD-10-CM | POA: Diagnosis not present

## 2019-10-23 DIAGNOSIS — N183 Chronic kidney disease, stage 3 unspecified: Secondary | ICD-10-CM | POA: Diagnosis not present

## 2019-10-23 DIAGNOSIS — M48062 Spinal stenosis, lumbar region with neurogenic claudication: Secondary | ICD-10-CM | POA: Diagnosis not present

## 2019-10-23 DIAGNOSIS — I5042 Chronic combined systolic (congestive) and diastolic (congestive) heart failure: Secondary | ICD-10-CM | POA: Diagnosis not present

## 2019-10-23 DIAGNOSIS — G894 Chronic pain syndrome: Secondary | ICD-10-CM | POA: Diagnosis not present

## 2019-10-23 DIAGNOSIS — M48061 Spinal stenosis, lumbar region without neurogenic claudication: Secondary | ICD-10-CM | POA: Diagnosis not present

## 2019-10-23 DIAGNOSIS — M353 Polymyalgia rheumatica: Secondary | ICD-10-CM | POA: Diagnosis not present

## 2019-10-23 DIAGNOSIS — M961 Postlaminectomy syndrome, not elsewhere classified: Secondary | ICD-10-CM | POA: Diagnosis not present

## 2019-10-23 DIAGNOSIS — E781 Pure hyperglyceridemia: Secondary | ICD-10-CM | POA: Diagnosis not present

## 2019-10-27 DIAGNOSIS — I130 Hypertensive heart and chronic kidney disease with heart failure and stage 1 through stage 4 chronic kidney disease, or unspecified chronic kidney disease: Secondary | ICD-10-CM | POA: Diagnosis not present

## 2019-10-27 DIAGNOSIS — G894 Chronic pain syndrome: Secondary | ICD-10-CM | POA: Diagnosis not present

## 2019-10-27 DIAGNOSIS — M961 Postlaminectomy syndrome, not elsewhere classified: Secondary | ICD-10-CM | POA: Diagnosis not present

## 2019-10-27 DIAGNOSIS — M48062 Spinal stenosis, lumbar region with neurogenic claudication: Secondary | ICD-10-CM | POA: Diagnosis not present

## 2019-10-27 DIAGNOSIS — N183 Chronic kidney disease, stage 3 unspecified: Secondary | ICD-10-CM | POA: Diagnosis not present

## 2019-10-27 DIAGNOSIS — I5042 Chronic combined systolic (congestive) and diastolic (congestive) heart failure: Secondary | ICD-10-CM | POA: Diagnosis not present

## 2019-10-27 NOTE — Progress Notes (Signed)
Virtual Visit via Telephone Note  I connected with Nicole [name] on 10/28/19 at 10:30 AM EST by telephone and verified that I am speaking with the correct person using two identifiers.  Location: Patient: Home  Provider: Clinic  This service was conducted via virtual visit.   The patient was located at home. I was located in my office.  Consent was obtained prior to the virtual visit and is aware of possible charges through their insurance for this visit.  The patient is an established patient.  Dr. Estanislado Pandy, MD conducted the virtual visit and Hazel Sams, PA-C acted as scribe during the service.  Office staff helped with scheduling follow up visits after the service was conducted.   I discussed the limitations, risks, security and privacy concerns of performing an evaluation and management service by telephone and the availability of in person appointments. I also discussed with the patient that there may be a patient responsible charge related to this service. The patient expressed understanding and agreed to proceed.  CC: Discuss medications  History of Present Illness: Patient is a 74 year old female with a past medical history of temporal arteritis, PMR, and osteoarthritis. She is currently on prednisone 4 mg po daily.  She tried taking Arava but discontinued after developing a rash under her armpits and side of her breasts.  Her rash has resolved with topical agents.  She has not had any signs or symptoms of a PMR or temporal arteritis flare since tapering the dose of prednisone. She denies any increased muscle aches or tenderness.   Review of Systems  Constitutional: Positive for malaise/fatigue. Negative for fever.  HENT:       +Dry mouth  Eyes: Negative for photophobia, pain, discharge and redness.       +Dry eyes  Respiratory: Negative for cough, shortness of breath and wheezing.   Cardiovascular: Negative for chest pain and palpitations.  Gastrointestinal: Negative for blood in  stool, constipation and diarrhea.  Genitourinary: Negative for dysuria.  Musculoskeletal: Negative for back pain, joint pain, myalgias and neck pain.  Skin: Negative for rash.  Neurological: Negative for dizziness and headaches. Speech change:    Psychiatric/Behavioral: Negative for depression. The patient is not nervous/anxious and does not have insomnia.      Observations/Objective: Physical Exam  Constitutional: She is oriented to person, place, and time.  Neurological: She is alert and oriented to person, place, and time.  Psychiatric: Mood, memory, affect and judgment normal.   Patient reports morning stiffness for 45 minutes.   Patient denies nocturnal pain.  Difficulty dressing/grooming: Denies Difficulty climbing stairs: Reports Difficulty getting out of chair: Reports Difficulty using hands for taps, buttons, cutlery, and/or writing: Denies   Assessment and Plan: Visit Diagnoses:Temporal arteritis (HCC)-She has not had any signs or symptoms of a flare recently. She denies any temporal artery tenderness or headaches recently.  She is clinically doing well on prednisone 4 mg po daily.  She will continue on this current regimen. She was advised to notify us if she develops signs of a flare.  She will follow up in 4 months.   Polymyalgia rheumatica ([place]) -She has not had any signs or symptoms of a PMR flare.  She is currently taking prednisone 4 mg po daily.  She has been able to taper by 1 mg every month without developing new or worsening symptoms.  She is not having any increased muscle aches or muscle weakness.  She started occupational therapy last week.  She remains primarily  in the wheelchair currently. She tried taking Arava but discontinued due to developing a rash in her armpits and side of breasts.  We discussed that it is difficult to determine if this rash was due to an allergic reaction or a possible yeast infection.  She does not want to take any other  immunosuppressive agents at this time.  She will continue taking prednisone 4 mg po daily.  She was advised to not taper prednisone any further.  She was advised to notify us if she develops signs or symptoms of a flare.    High risk medication use -Prednisone 4 mg po daily (She will not taper any further).  She started on Arava 10 mg 1 tablet daily on 09/25/19 but discontinued after developing a rash under her armpits/breasts.  She will be updating lab work this week.   On prednisone therapy - She is taking prednisone 4 mg po daily.   Rheumatoid factor positive: She has no joint swelling currently.   Positive anti-CCP test: She has no joint swelling currently.  Chronic pain syndrome:She continues have generalized pain and deconditioning. She is on Nucynta and is taking it as prescribed.   Primary osteoarthritis of both knees:She has chronic pain in both knee joints. She is primarily in a wheelchair.  She has started working with occupational therapy.   DDD (degenerative disc disease), lumbar: She has intermittent lower back pain.  She takes Nucynta 150 mg BID for pain relief.   Spinal stenosis, thoracic: She has thoracic kyphosis.   Osteoporosis screening -DEXA9/20/2018 right femoral neck BMD 0.843 with T score -0.8. She has been on long-term prednisone. She is currently on prednisone 4 mg by mouth daily. She has not had any recent falls or fractures. Updated DEXA ordered previously.    Other medical conditions are listed as follows:  Vitamin D deficiency  History of hypertension  History of gastroesophageal reflux (GERD)  Memory loss  History of diverticulitis  Follow Up Instructions: She will follow up in 4 months.    I discussed the assessment and treatment plan with the patient. The patient was provided an opportunity to ask questions and all were answered. The patient agreed with the plan and demonstrated an understanding of the instructions.   The  patient was advised to call back or seek an in-person evaluation if the symptoms worsen or if the condition fails to improve as anticipated.  I provided 25 minutes of non-face-to-face time during this encounter.  Bo Merino, MD   Scribed by-  Hazel Sams, PA-C

## 2019-10-28 ENCOUNTER — Encounter: Payer: Self-pay | Admitting: Rheumatology

## 2019-10-28 ENCOUNTER — Telehealth (INDEPENDENT_AMBULATORY_CARE_PROVIDER_SITE_OTHER): Payer: Medicare Other | Admitting: Rheumatology

## 2019-10-28 ENCOUNTER — Other Ambulatory Visit: Payer: Self-pay

## 2019-10-28 DIAGNOSIS — G894 Chronic pain syndrome: Secondary | ICD-10-CM

## 2019-10-28 DIAGNOSIS — Z8679 Personal history of other diseases of the circulatory system: Secondary | ICD-10-CM

## 2019-10-28 DIAGNOSIS — M170 Bilateral primary osteoarthritis of knee: Secondary | ICD-10-CM | POA: Diagnosis not present

## 2019-10-28 DIAGNOSIS — M4804 Spinal stenosis, thoracic region: Secondary | ICD-10-CM

## 2019-10-28 DIAGNOSIS — Z79899 Other long term (current) drug therapy: Secondary | ICD-10-CM | POA: Diagnosis not present

## 2019-10-28 DIAGNOSIS — M316 Other giant cell arteritis: Secondary | ICD-10-CM

## 2019-10-28 DIAGNOSIS — Z7952 Long term (current) use of systemic steroids: Secondary | ICD-10-CM

## 2019-10-28 DIAGNOSIS — Z8719 Personal history of other diseases of the digestive system: Secondary | ICD-10-CM

## 2019-10-28 DIAGNOSIS — R413 Other amnesia: Secondary | ICD-10-CM

## 2019-10-28 DIAGNOSIS — M353 Polymyalgia rheumatica: Secondary | ICD-10-CM

## 2019-10-28 DIAGNOSIS — E559 Vitamin D deficiency, unspecified: Secondary | ICD-10-CM

## 2019-10-28 DIAGNOSIS — R768 Other specified abnormal immunological findings in serum: Secondary | ICD-10-CM

## 2019-10-28 DIAGNOSIS — M5136 Other intervertebral disc degeneration, lumbar region: Secondary | ICD-10-CM

## 2019-10-28 DIAGNOSIS — Z13820 Encounter for screening for osteoporosis: Secondary | ICD-10-CM

## 2019-10-29 DIAGNOSIS — M48062 Spinal stenosis, lumbar region with neurogenic claudication: Secondary | ICD-10-CM | POA: Diagnosis not present

## 2019-10-29 DIAGNOSIS — G894 Chronic pain syndrome: Secondary | ICD-10-CM | POA: Diagnosis not present

## 2019-10-29 DIAGNOSIS — I5042 Chronic combined systolic (congestive) and diastolic (congestive) heart failure: Secondary | ICD-10-CM | POA: Diagnosis not present

## 2019-10-29 DIAGNOSIS — M961 Postlaminectomy syndrome, not elsewhere classified: Secondary | ICD-10-CM | POA: Diagnosis not present

## 2019-10-29 DIAGNOSIS — N183 Chronic kidney disease, stage 3 unspecified: Secondary | ICD-10-CM | POA: Diagnosis not present

## 2019-10-29 DIAGNOSIS — I130 Hypertensive heart and chronic kidney disease with heart failure and stage 1 through stage 4 chronic kidney disease, or unspecified chronic kidney disease: Secondary | ICD-10-CM | POA: Diagnosis not present

## 2019-10-30 DIAGNOSIS — M353 Polymyalgia rheumatica: Secondary | ICD-10-CM | POA: Diagnosis not present

## 2019-10-30 DIAGNOSIS — Z79899 Other long term (current) drug therapy: Secondary | ICD-10-CM | POA: Diagnosis not present

## 2019-10-30 DIAGNOSIS — G894 Chronic pain syndrome: Secondary | ICD-10-CM | POA: Diagnosis not present

## 2019-10-30 DIAGNOSIS — I130 Hypertensive heart and chronic kidney disease with heart failure and stage 1 through stage 4 chronic kidney disease, or unspecified chronic kidney disease: Secondary | ICD-10-CM | POA: Diagnosis not present

## 2019-10-30 DIAGNOSIS — M316 Other giant cell arteritis: Secondary | ICD-10-CM | POA: Diagnosis not present

## 2019-10-30 DIAGNOSIS — M961 Postlaminectomy syndrome, not elsewhere classified: Secondary | ICD-10-CM | POA: Diagnosis not present

## 2019-10-30 DIAGNOSIS — M48062 Spinal stenosis, lumbar region with neurogenic claudication: Secondary | ICD-10-CM | POA: Diagnosis not present

## 2019-10-31 NOTE — Progress Notes (Signed)
WBC is mildly elevated due to prednisone use.  Creatinine is high but is stable.  LFTs are mildly elevated.  I noticed Voltaren tablets on her medication list.  Please advise patient not to take any NSAIDs.  Sed rate is pending.

## 2019-11-01 LAB — CMP14+EGFR
ALT: 36 IU/L — ABNORMAL HIGH (ref 0–32)
AST: 36 IU/L (ref 0–40)
Albumin/Globulin Ratio: 1.4 (ref 1.2–2.2)
Albumin: 4.1 g/dL (ref 3.7–4.7)
Alkaline Phosphatase: 158 IU/L — ABNORMAL HIGH (ref 39–117)
BUN/Creatinine Ratio: 10 — ABNORMAL LOW (ref 12–28)
BUN: 14 mg/dL (ref 8–27)
Bilirubin Total: 0.3 mg/dL (ref 0.0–1.2)
CO2: 24 mmol/L (ref 20–29)
Calcium: 9.6 mg/dL (ref 8.7–10.3)
Chloride: 99 mmol/L (ref 96–106)
Creatinine, Ser: 1.4 mg/dL — ABNORMAL HIGH (ref 0.57–1.00)
GFR calc Af Amer: 43 mL/min/{1.73_m2} — ABNORMAL LOW (ref >59)
GFR calc non Af Amer: 37 mL/min/{1.73_m2} — ABNORMAL LOW (ref >59)
Globulin, Total: 3 g/dL (ref 1.5–4.5)
Glucose: 94 mg/dL (ref 65–99)
Potassium: 4.6 mmol/L (ref 3.5–5.2)
Sodium: 139 mmol/L (ref 134–144)
Total Protein: 7.1 g/dL (ref 6.0–8.5)

## 2019-11-01 LAB — CBC WITH DIFFERENTIAL/PLATELET
Basophils Absolute: 0.1 10*3/uL (ref 0.0–0.2)
Basos: 0 %
EOS (ABSOLUTE): 0.2 10*3/uL (ref 0.0–0.4)
Eos: 2 %
Hematocrit: 38.2 % (ref 34.0–46.6)
Hemoglobin: 12.7 g/dL (ref 11.1–15.9)
Immature Grans (Abs): 0 10*3/uL (ref 0.0–0.1)
Immature Granulocytes: 0 %
Lymphocytes Absolute: 2.5 10*3/uL (ref 0.7–3.1)
Lymphs: 22 %
MCH: 29.7 pg (ref 26.6–33.0)
MCHC: 33.2 g/dL (ref 31.5–35.7)
MCV: 90 fL (ref 79–97)
Monocytes Absolute: 0.9 10*3/uL (ref 0.1–0.9)
Monocytes: 8 %
Neutrophils Absolute: 7.6 10*3/uL — ABNORMAL HIGH (ref 1.4–7.0)
Neutrophils: 68 %
Platelets: 359 10*3/uL (ref 150–450)
RBC: 4.27 x10E6/uL (ref 3.77–5.28)
RDW: 14.5 % (ref 11.7–15.4)
WBC: 11.3 10*3/uL — ABNORMAL HIGH (ref 3.4–10.8)

## 2019-11-01 LAB — SEDIMENTATION RATE: Sed Rate: 70 mm/hr — ABNORMAL HIGH (ref 0–40)

## 2019-11-03 DIAGNOSIS — G894 Chronic pain syndrome: Secondary | ICD-10-CM | POA: Diagnosis not present

## 2019-11-03 DIAGNOSIS — M48062 Spinal stenosis, lumbar region with neurogenic claudication: Secondary | ICD-10-CM | POA: Diagnosis not present

## 2019-11-03 DIAGNOSIS — I5042 Chronic combined systolic (congestive) and diastolic (congestive) heart failure: Secondary | ICD-10-CM | POA: Diagnosis not present

## 2019-11-03 DIAGNOSIS — M961 Postlaminectomy syndrome, not elsewhere classified: Secondary | ICD-10-CM | POA: Diagnosis not present

## 2019-11-03 DIAGNOSIS — N183 Chronic kidney disease, stage 3 unspecified: Secondary | ICD-10-CM | POA: Diagnosis not present

## 2019-11-03 DIAGNOSIS — I130 Hypertensive heart and chronic kidney disease with heart failure and stage 1 through stage 4 chronic kidney disease, or unspecified chronic kidney disease: Secondary | ICD-10-CM | POA: Diagnosis not present

## 2019-11-03 NOTE — Progress Notes (Signed)
Sed rate is elevated but is stable.  Please advise patient to stay on prednisone 5 mg p.o. daily.  We will check labs in 3 months again.

## 2019-11-05 ENCOUNTER — Other Ambulatory Visit: Payer: Self-pay

## 2019-11-05 ENCOUNTER — Telehealth: Payer: Self-pay | Admitting: Rheumatology

## 2019-11-05 DIAGNOSIS — M48062 Spinal stenosis, lumbar region with neurogenic claudication: Secondary | ICD-10-CM | POA: Diagnosis not present

## 2019-11-05 DIAGNOSIS — M961 Postlaminectomy syndrome, not elsewhere classified: Secondary | ICD-10-CM | POA: Diagnosis not present

## 2019-11-05 DIAGNOSIS — I130 Hypertensive heart and chronic kidney disease with heart failure and stage 1 through stage 4 chronic kidney disease, or unspecified chronic kidney disease: Secondary | ICD-10-CM | POA: Diagnosis not present

## 2019-11-05 DIAGNOSIS — G894 Chronic pain syndrome: Secondary | ICD-10-CM | POA: Diagnosis not present

## 2019-11-05 DIAGNOSIS — I5042 Chronic combined systolic (congestive) and diastolic (congestive) heart failure: Secondary | ICD-10-CM | POA: Diagnosis not present

## 2019-11-05 DIAGNOSIS — N183 Chronic kidney disease, stage 3 unspecified: Secondary | ICD-10-CM | POA: Diagnosis not present

## 2019-11-05 MED ORDER — PREDNISONE 1 MG PO TABS: 5.0000 mg | ORAL_TABLET | Freq: Every day | ORAL | 0 refills | Status: DC

## 2019-11-05 NOTE — Telephone Encounter (Signed)
Patient request refill on Prednisone sent to [facility].

## 2019-11-05 NOTE — Progress Notes (Signed)
"  no print" prescription updated for prednisone dose.

## 2019-11-06 DIAGNOSIS — I5042 Chronic combined systolic (congestive) and diastolic (congestive) heart failure: Secondary | ICD-10-CM | POA: Diagnosis not present

## 2019-11-06 DIAGNOSIS — I130 Hypertensive heart and chronic kidney disease with heart failure and stage 1 through stage 4 chronic kidney disease, or unspecified chronic kidney disease: Secondary | ICD-10-CM | POA: Diagnosis not present

## 2019-11-06 DIAGNOSIS — M961 Postlaminectomy syndrome, not elsewhere classified: Secondary | ICD-10-CM | POA: Diagnosis not present

## 2019-11-06 DIAGNOSIS — N183 Chronic kidney disease, stage 3 unspecified: Secondary | ICD-10-CM | POA: Diagnosis not present

## 2019-11-06 DIAGNOSIS — M48062 Spinal stenosis, lumbar region with neurogenic claudication: Secondary | ICD-10-CM | POA: Diagnosis not present

## 2019-11-06 DIAGNOSIS — G894 Chronic pain syndrome: Secondary | ICD-10-CM | POA: Diagnosis not present

## 2019-11-06 MED ORDER — PREDNISONE 5 MG PO TABS: 5.0000 mg | ORAL_TABLET | Freq: Every day | ORAL | 0 refills | Status: DC

## 2019-11-06 NOTE — Telephone Encounter (Signed)
Last Visit: 10/28/2019 telemedicine  Next Visit: 03/01/2020  Labs: Sed rate is elevated but is stable. Please advise patient to stay on prednisone 5 mg p.o. daily. We will check labs in 3 months again.  Okay to refill per Dr. Estanislado Pandy.

## 2019-11-10 DIAGNOSIS — M353 Polymyalgia rheumatica: Secondary | ICD-10-CM | POA: Diagnosis not present

## 2019-11-10 DIAGNOSIS — M6281 Muscle weakness (generalized): Secondary | ICD-10-CM | POA: Diagnosis not present

## 2019-11-10 DIAGNOSIS — I130 Hypertensive heart and chronic kidney disease with heart failure and stage 1 through stage 4 chronic kidney disease, or unspecified chronic kidney disease: Secondary | ICD-10-CM | POA: Diagnosis not present

## 2019-11-10 DIAGNOSIS — M48062 Spinal stenosis, lumbar region with neurogenic claudication: Secondary | ICD-10-CM | POA: Diagnosis not present

## 2019-11-10 DIAGNOSIS — M961 Postlaminectomy syndrome, not elsewhere classified: Secondary | ICD-10-CM | POA: Diagnosis not present

## 2019-11-10 DIAGNOSIS — N183 Chronic kidney disease, stage 3 unspecified: Secondary | ICD-10-CM | POA: Diagnosis not present

## 2019-11-10 DIAGNOSIS — R262 Difficulty in walking, not elsewhere classified: Secondary | ICD-10-CM | POA: Diagnosis not present

## 2019-11-10 DIAGNOSIS — Z6841 Body Mass Index (BMI) 40.0 and over, adult: Secondary | ICD-10-CM | POA: Diagnosis not present

## 2019-11-10 DIAGNOSIS — D631 Anemia in chronic kidney disease: Secondary | ICD-10-CM | POA: Diagnosis not present

## 2019-11-10 DIAGNOSIS — I5042 Chronic combined systolic (congestive) and diastolic (congestive) heart failure: Secondary | ICD-10-CM | POA: Diagnosis not present

## 2019-11-10 DIAGNOSIS — G894 Chronic pain syndrome: Secondary | ICD-10-CM | POA: Diagnosis not present

## 2019-11-12 DIAGNOSIS — M961 Postlaminectomy syndrome, not elsewhere classified: Secondary | ICD-10-CM | POA: Diagnosis not present

## 2019-11-12 DIAGNOSIS — M48062 Spinal stenosis, lumbar region with neurogenic claudication: Secondary | ICD-10-CM | POA: Diagnosis not present

## 2019-11-12 DIAGNOSIS — G894 Chronic pain syndrome: Secondary | ICD-10-CM | POA: Diagnosis not present

## 2019-11-12 DIAGNOSIS — I5042 Chronic combined systolic (congestive) and diastolic (congestive) heart failure: Secondary | ICD-10-CM | POA: Diagnosis not present

## 2019-11-12 DIAGNOSIS — N183 Chronic kidney disease, stage 3 unspecified: Secondary | ICD-10-CM | POA: Diagnosis not present

## 2019-11-12 DIAGNOSIS — I130 Hypertensive heart and chronic kidney disease with heart failure and stage 1 through stage 4 chronic kidney disease, or unspecified chronic kidney disease: Secondary | ICD-10-CM | POA: Diagnosis not present

## 2019-11-18 DIAGNOSIS — M48062 Spinal stenosis, lumbar region with neurogenic claudication: Secondary | ICD-10-CM | POA: Diagnosis not present

## 2019-11-18 DIAGNOSIS — M961 Postlaminectomy syndrome, not elsewhere classified: Secondary | ICD-10-CM | POA: Diagnosis not present

## 2019-11-18 DIAGNOSIS — I5042 Chronic combined systolic (congestive) and diastolic (congestive) heart failure: Secondary | ICD-10-CM | POA: Diagnosis not present

## 2019-11-18 DIAGNOSIS — I130 Hypertensive heart and chronic kidney disease with heart failure and stage 1 through stage 4 chronic kidney disease, or unspecified chronic kidney disease: Secondary | ICD-10-CM | POA: Diagnosis not present

## 2019-11-18 DIAGNOSIS — N183 Chronic kidney disease, stage 3 unspecified: Secondary | ICD-10-CM | POA: Diagnosis not present

## 2019-11-18 DIAGNOSIS — G894 Chronic pain syndrome: Secondary | ICD-10-CM | POA: Diagnosis not present

## 2019-11-19 DIAGNOSIS — I130 Hypertensive heart and chronic kidney disease with heart failure and stage 1 through stage 4 chronic kidney disease, or unspecified chronic kidney disease: Secondary | ICD-10-CM | POA: Diagnosis not present

## 2019-11-19 DIAGNOSIS — I5042 Chronic combined systolic (congestive) and diastolic (congestive) heart failure: Secondary | ICD-10-CM | POA: Diagnosis not present

## 2019-11-19 DIAGNOSIS — M48062 Spinal stenosis, lumbar region with neurogenic claudication: Secondary | ICD-10-CM | POA: Diagnosis not present

## 2019-11-19 DIAGNOSIS — N183 Chronic kidney disease, stage 3 unspecified: Secondary | ICD-10-CM | POA: Diagnosis not present

## 2019-11-19 DIAGNOSIS — G894 Chronic pain syndrome: Secondary | ICD-10-CM | POA: Diagnosis not present

## 2019-11-19 DIAGNOSIS — M961 Postlaminectomy syndrome, not elsewhere classified: Secondary | ICD-10-CM | POA: Diagnosis not present

## 2019-11-20 DIAGNOSIS — G894 Chronic pain syndrome: Secondary | ICD-10-CM | POA: Diagnosis not present

## 2019-11-20 DIAGNOSIS — I130 Hypertensive heart and chronic kidney disease with heart failure and stage 1 through stage 4 chronic kidney disease, or unspecified chronic kidney disease: Secondary | ICD-10-CM | POA: Diagnosis not present

## 2019-11-20 DIAGNOSIS — M961 Postlaminectomy syndrome, not elsewhere classified: Secondary | ICD-10-CM | POA: Diagnosis not present

## 2019-11-20 DIAGNOSIS — I5042 Chronic combined systolic (congestive) and diastolic (congestive) heart failure: Secondary | ICD-10-CM | POA: Diagnosis not present

## 2019-11-20 DIAGNOSIS — N183 Chronic kidney disease, stage 3 unspecified: Secondary | ICD-10-CM | POA: Diagnosis not present

## 2019-11-20 DIAGNOSIS — M48062 Spinal stenosis, lumbar region with neurogenic claudication: Secondary | ICD-10-CM | POA: Diagnosis not present

## 2019-11-23 DIAGNOSIS — M353 Polymyalgia rheumatica: Secondary | ICD-10-CM | POA: Diagnosis not present

## 2019-11-23 DIAGNOSIS — M48061 Spinal stenosis, lumbar region without neurogenic claudication: Secondary | ICD-10-CM | POA: Diagnosis not present

## 2019-11-23 DIAGNOSIS — I5032 Chronic diastolic (congestive) heart failure: Secondary | ICD-10-CM | POA: Diagnosis not present

## 2019-11-23 DIAGNOSIS — E781 Pure hyperglyceridemia: Secondary | ICD-10-CM | POA: Diagnosis not present

## 2019-11-24 DIAGNOSIS — M961 Postlaminectomy syndrome, not elsewhere classified: Secondary | ICD-10-CM | POA: Diagnosis not present

## 2019-11-24 DIAGNOSIS — M48062 Spinal stenosis, lumbar region with neurogenic claudication: Secondary | ICD-10-CM | POA: Diagnosis not present

## 2019-11-24 DIAGNOSIS — N183 Chronic kidney disease, stage 3 unspecified: Secondary | ICD-10-CM | POA: Diagnosis not present

## 2019-11-24 DIAGNOSIS — I130 Hypertensive heart and chronic kidney disease with heart failure and stage 1 through stage 4 chronic kidney disease, or unspecified chronic kidney disease: Secondary | ICD-10-CM | POA: Diagnosis not present

## 2019-11-24 DIAGNOSIS — I5042 Chronic combined systolic (congestive) and diastolic (congestive) heart failure: Secondary | ICD-10-CM | POA: Diagnosis not present

## 2019-11-24 DIAGNOSIS — G894 Chronic pain syndrome: Secondary | ICD-10-CM | POA: Diagnosis not present

## 2019-11-26 DIAGNOSIS — N183 Chronic kidney disease, stage 3 unspecified: Secondary | ICD-10-CM | POA: Diagnosis not present

## 2019-11-26 DIAGNOSIS — M48062 Spinal stenosis, lumbar region with neurogenic claudication: Secondary | ICD-10-CM | POA: Diagnosis not present

## 2019-11-26 DIAGNOSIS — I130 Hypertensive heart and chronic kidney disease with heart failure and stage 1 through stage 4 chronic kidney disease, or unspecified chronic kidney disease: Secondary | ICD-10-CM | POA: Diagnosis not present

## 2019-11-26 DIAGNOSIS — I5042 Chronic combined systolic (congestive) and diastolic (congestive) heart failure: Secondary | ICD-10-CM | POA: Diagnosis not present

## 2019-11-26 DIAGNOSIS — G894 Chronic pain syndrome: Secondary | ICD-10-CM | POA: Diagnosis not present

## 2019-11-26 DIAGNOSIS — M961 Postlaminectomy syndrome, not elsewhere classified: Secondary | ICD-10-CM | POA: Diagnosis not present

## 2019-11-27 DIAGNOSIS — M069 Rheumatoid arthritis, unspecified: Secondary | ICD-10-CM | POA: Diagnosis not present

## 2019-11-27 DIAGNOSIS — M353 Polymyalgia rheumatica: Secondary | ICD-10-CM | POA: Diagnosis not present

## 2019-11-27 DIAGNOSIS — M150 Primary generalized (osteo)arthritis: Secondary | ICD-10-CM | POA: Diagnosis not present

## 2019-12-01 ENCOUNTER — Ambulatory Visit: Payer: TRICARE For Life (TFL) | Admitting: Physician Assistant

## 2019-12-01 DIAGNOSIS — G894 Chronic pain syndrome: Secondary | ICD-10-CM | POA: Diagnosis not present

## 2019-12-01 DIAGNOSIS — I130 Hypertensive heart and chronic kidney disease with heart failure and stage 1 through stage 4 chronic kidney disease, or unspecified chronic kidney disease: Secondary | ICD-10-CM | POA: Diagnosis not present

## 2019-12-01 DIAGNOSIS — M961 Postlaminectomy syndrome, not elsewhere classified: Secondary | ICD-10-CM | POA: Diagnosis not present

## 2019-12-01 DIAGNOSIS — I5042 Chronic combined systolic (congestive) and diastolic (congestive) heart failure: Secondary | ICD-10-CM | POA: Diagnosis not present

## 2019-12-01 DIAGNOSIS — N183 Chronic kidney disease, stage 3 unspecified: Secondary | ICD-10-CM | POA: Diagnosis not present

## 2019-12-01 DIAGNOSIS — M48062 Spinal stenosis, lumbar region with neurogenic claudication: Secondary | ICD-10-CM | POA: Diagnosis not present

## 2019-12-02 DIAGNOSIS — I130 Hypertensive heart and chronic kidney disease with heart failure and stage 1 through stage 4 chronic kidney disease, or unspecified chronic kidney disease: Secondary | ICD-10-CM | POA: Diagnosis not present

## 2019-12-02 DIAGNOSIS — I5042 Chronic combined systolic (congestive) and diastolic (congestive) heart failure: Secondary | ICD-10-CM | POA: Diagnosis not present

## 2019-12-02 DIAGNOSIS — M961 Postlaminectomy syndrome, not elsewhere classified: Secondary | ICD-10-CM | POA: Diagnosis not present

## 2019-12-02 DIAGNOSIS — N183 Chronic kidney disease, stage 3 unspecified: Secondary | ICD-10-CM | POA: Diagnosis not present

## 2019-12-02 DIAGNOSIS — M48062 Spinal stenosis, lumbar region with neurogenic claudication: Secondary | ICD-10-CM | POA: Diagnosis not present

## 2019-12-02 DIAGNOSIS — G894 Chronic pain syndrome: Secondary | ICD-10-CM | POA: Diagnosis not present

## 2019-12-05 ENCOUNTER — Other Ambulatory Visit: Payer: Self-pay | Admitting: Physician Assistant

## 2019-12-05 ENCOUNTER — Telehealth: Payer: Self-pay | Admitting: Rheumatology

## 2019-12-05 DIAGNOSIS — M961 Postlaminectomy syndrome, not elsewhere classified: Secondary | ICD-10-CM | POA: Diagnosis not present

## 2019-12-05 DIAGNOSIS — M48062 Spinal stenosis, lumbar region with neurogenic claudication: Secondary | ICD-10-CM | POA: Diagnosis not present

## 2019-12-05 DIAGNOSIS — I5042 Chronic combined systolic (congestive) and diastolic (congestive) heart failure: Secondary | ICD-10-CM | POA: Diagnosis not present

## 2019-12-05 DIAGNOSIS — G894 Chronic pain syndrome: Secondary | ICD-10-CM | POA: Diagnosis not present

## 2019-12-05 DIAGNOSIS — N183 Chronic kidney disease, stage 3 unspecified: Secondary | ICD-10-CM | POA: Diagnosis not present

## 2019-12-05 DIAGNOSIS — I130 Hypertensive heart and chronic kidney disease with heart failure and stage 1 through stage 4 chronic kidney disease, or unspecified chronic kidney disease: Secondary | ICD-10-CM | POA: Diagnosis not present

## 2019-12-05 MED ORDER — PREDNISONE 5 MG PO TABS: 5.0000 mg | ORAL_TABLET | Freq: Every day | ORAL | 0 refills | Status: DC

## 2019-12-05 NOTE — Telephone Encounter (Signed)
Patient called stating Dr. Estanislado Pandy increased her Prednisone to 5 mg at her last virtual appointment on 10/28/19.  Patient is requesting a return call to let her know how long she should continue taking 5 mg.

## 2019-12-05 NOTE — Telephone Encounter (Signed)
ESR was checked on 10/30/2019: Sed rate is elevated but is stable. Please advise patient to stay on prednisone 5 mg p.o. daily. We will check labs in 3 months again. Advised patient to stay on prednisone 21m po daily until we re-check labs on or around 01/28/2020. Patient verbalized understanding and requested a refill of 5 mg tablets.

## 2019-12-08 DIAGNOSIS — G894 Chronic pain syndrome: Secondary | ICD-10-CM | POA: Diagnosis not present

## 2019-12-08 DIAGNOSIS — M961 Postlaminectomy syndrome, not elsewhere classified: Secondary | ICD-10-CM | POA: Diagnosis not present

## 2019-12-08 DIAGNOSIS — I130 Hypertensive heart and chronic kidney disease with heart failure and stage 1 through stage 4 chronic kidney disease, or unspecified chronic kidney disease: Secondary | ICD-10-CM | POA: Diagnosis not present

## 2019-12-08 DIAGNOSIS — M48062 Spinal stenosis, lumbar region with neurogenic claudication: Secondary | ICD-10-CM | POA: Diagnosis not present

## 2019-12-08 DIAGNOSIS — I5042 Chronic combined systolic (congestive) and diastolic (congestive) heart failure: Secondary | ICD-10-CM | POA: Diagnosis not present

## 2019-12-08 DIAGNOSIS — N183 Chronic kidney disease, stage 3 unspecified: Secondary | ICD-10-CM | POA: Diagnosis not present

## 2019-12-10 DIAGNOSIS — G894 Chronic pain syndrome: Secondary | ICD-10-CM | POA: Diagnosis not present

## 2019-12-10 DIAGNOSIS — I130 Hypertensive heart and chronic kidney disease with heart failure and stage 1 through stage 4 chronic kidney disease, or unspecified chronic kidney disease: Secondary | ICD-10-CM | POA: Diagnosis not present

## 2019-12-10 DIAGNOSIS — M961 Postlaminectomy syndrome, not elsewhere classified: Secondary | ICD-10-CM | POA: Diagnosis not present

## 2019-12-10 DIAGNOSIS — Z6841 Body Mass Index (BMI) 40.0 and over, adult: Secondary | ICD-10-CM | POA: Diagnosis not present

## 2019-12-10 DIAGNOSIS — D631 Anemia in chronic kidney disease: Secondary | ICD-10-CM | POA: Diagnosis not present

## 2019-12-10 DIAGNOSIS — M6281 Muscle weakness (generalized): Secondary | ICD-10-CM | POA: Diagnosis not present

## 2019-12-10 DIAGNOSIS — M48062 Spinal stenosis, lumbar region with neurogenic claudication: Secondary | ICD-10-CM | POA: Diagnosis not present

## 2019-12-10 DIAGNOSIS — I5042 Chronic combined systolic (congestive) and diastolic (congestive) heart failure: Secondary | ICD-10-CM | POA: Diagnosis not present

## 2019-12-10 DIAGNOSIS — K5900 Constipation, unspecified: Secondary | ICD-10-CM | POA: Diagnosis not present

## 2019-12-10 DIAGNOSIS — R262 Difficulty in walking, not elsewhere classified: Secondary | ICD-10-CM | POA: Diagnosis not present

## 2019-12-10 DIAGNOSIS — N183 Chronic kidney disease, stage 3 unspecified: Secondary | ICD-10-CM | POA: Diagnosis not present

## 2019-12-10 DIAGNOSIS — M353 Polymyalgia rheumatica: Secondary | ICD-10-CM | POA: Diagnosis not present

## 2019-12-16 DIAGNOSIS — I130 Hypertensive heart and chronic kidney disease with heart failure and stage 1 through stage 4 chronic kidney disease, or unspecified chronic kidney disease: Secondary | ICD-10-CM | POA: Diagnosis not present

## 2019-12-16 DIAGNOSIS — M48062 Spinal stenosis, lumbar region with neurogenic claudication: Secondary | ICD-10-CM | POA: Diagnosis not present

## 2019-12-16 DIAGNOSIS — M961 Postlaminectomy syndrome, not elsewhere classified: Secondary | ICD-10-CM | POA: Diagnosis not present

## 2019-12-16 DIAGNOSIS — I5042 Chronic combined systolic (congestive) and diastolic (congestive) heart failure: Secondary | ICD-10-CM | POA: Diagnosis not present

## 2019-12-16 DIAGNOSIS — N183 Chronic kidney disease, stage 3 unspecified: Secondary | ICD-10-CM | POA: Diagnosis not present

## 2019-12-16 DIAGNOSIS — G894 Chronic pain syndrome: Secondary | ICD-10-CM | POA: Diagnosis not present

## 2019-12-18 DIAGNOSIS — M961 Postlaminectomy syndrome, not elsewhere classified: Secondary | ICD-10-CM | POA: Diagnosis not present

## 2019-12-18 DIAGNOSIS — G894 Chronic pain syndrome: Secondary | ICD-10-CM | POA: Diagnosis not present

## 2019-12-18 DIAGNOSIS — I130 Hypertensive heart and chronic kidney disease with heart failure and stage 1 through stage 4 chronic kidney disease, or unspecified chronic kidney disease: Secondary | ICD-10-CM | POA: Diagnosis not present

## 2019-12-18 DIAGNOSIS — M48062 Spinal stenosis, lumbar region with neurogenic claudication: Secondary | ICD-10-CM | POA: Diagnosis not present

## 2019-12-21 DIAGNOSIS — I5032 Chronic diastolic (congestive) heart failure: Secondary | ICD-10-CM | POA: Diagnosis not present

## 2019-12-21 DIAGNOSIS — M48061 Spinal stenosis, lumbar region without neurogenic claudication: Secondary | ICD-10-CM | POA: Diagnosis not present

## 2019-12-21 DIAGNOSIS — E781 Pure hyperglyceridemia: Secondary | ICD-10-CM | POA: Diagnosis not present

## 2019-12-21 DIAGNOSIS — M353 Polymyalgia rheumatica: Secondary | ICD-10-CM | POA: Diagnosis not present

## 2019-12-23 DIAGNOSIS — M961 Postlaminectomy syndrome, not elsewhere classified: Secondary | ICD-10-CM | POA: Diagnosis not present

## 2019-12-23 DIAGNOSIS — M48062 Spinal stenosis, lumbar region with neurogenic claudication: Secondary | ICD-10-CM | POA: Diagnosis not present

## 2019-12-23 DIAGNOSIS — G894 Chronic pain syndrome: Secondary | ICD-10-CM | POA: Diagnosis not present

## 2019-12-23 DIAGNOSIS — N183 Chronic kidney disease, stage 3 unspecified: Secondary | ICD-10-CM | POA: Diagnosis not present

## 2019-12-23 DIAGNOSIS — I130 Hypertensive heart and chronic kidney disease with heart failure and stage 1 through stage 4 chronic kidney disease, or unspecified chronic kidney disease: Secondary | ICD-10-CM | POA: Diagnosis not present

## 2019-12-23 DIAGNOSIS — I5042 Chronic combined systolic (congestive) and diastolic (congestive) heart failure: Secondary | ICD-10-CM | POA: Diagnosis not present

## 2019-12-29 DIAGNOSIS — M48062 Spinal stenosis, lumbar region with neurogenic claudication: Secondary | ICD-10-CM | POA: Diagnosis not present

## 2019-12-29 DIAGNOSIS — G894 Chronic pain syndrome: Secondary | ICD-10-CM | POA: Diagnosis not present

## 2019-12-29 DIAGNOSIS — N183 Chronic kidney disease, stage 3 unspecified: Secondary | ICD-10-CM | POA: Diagnosis not present

## 2019-12-29 DIAGNOSIS — I130 Hypertensive heart and chronic kidney disease with heart failure and stage 1 through stage 4 chronic kidney disease, or unspecified chronic kidney disease: Secondary | ICD-10-CM | POA: Diagnosis not present

## 2019-12-29 DIAGNOSIS — I5042 Chronic combined systolic (congestive) and diastolic (congestive) heart failure: Secondary | ICD-10-CM | POA: Diagnosis not present

## 2019-12-29 DIAGNOSIS — M961 Postlaminectomy syndrome, not elsewhere classified: Secondary | ICD-10-CM | POA: Diagnosis not present

## 2020-01-01 DIAGNOSIS — N183 Chronic kidney disease, stage 3 unspecified: Secondary | ICD-10-CM | POA: Diagnosis not present

## 2020-01-01 DIAGNOSIS — I130 Hypertensive heart and chronic kidney disease with heart failure and stage 1 through stage 4 chronic kidney disease, or unspecified chronic kidney disease: Secondary | ICD-10-CM | POA: Diagnosis not present

## 2020-01-01 DIAGNOSIS — G894 Chronic pain syndrome: Secondary | ICD-10-CM | POA: Diagnosis not present

## 2020-01-01 DIAGNOSIS — I5042 Chronic combined systolic (congestive) and diastolic (congestive) heart failure: Secondary | ICD-10-CM | POA: Diagnosis not present

## 2020-01-01 DIAGNOSIS — M48062 Spinal stenosis, lumbar region with neurogenic claudication: Secondary | ICD-10-CM | POA: Diagnosis not present

## 2020-01-01 DIAGNOSIS — M961 Postlaminectomy syndrome, not elsewhere classified: Secondary | ICD-10-CM | POA: Diagnosis not present

## 2020-01-06 DIAGNOSIS — I5042 Chronic combined systolic (congestive) and diastolic (congestive) heart failure: Secondary | ICD-10-CM | POA: Diagnosis not present

## 2020-01-06 DIAGNOSIS — M48062 Spinal stenosis, lumbar region with neurogenic claudication: Secondary | ICD-10-CM | POA: Diagnosis not present

## 2020-01-06 DIAGNOSIS — M961 Postlaminectomy syndrome, not elsewhere classified: Secondary | ICD-10-CM | POA: Diagnosis not present

## 2020-01-06 DIAGNOSIS — N183 Chronic kidney disease, stage 3 unspecified: Secondary | ICD-10-CM | POA: Diagnosis not present

## 2020-01-06 DIAGNOSIS — I130 Hypertensive heart and chronic kidney disease with heart failure and stage 1 through stage 4 chronic kidney disease, or unspecified chronic kidney disease: Secondary | ICD-10-CM | POA: Diagnosis not present

## 2020-01-06 DIAGNOSIS — G894 Chronic pain syndrome: Secondary | ICD-10-CM | POA: Diagnosis not present

## 2020-01-09 DIAGNOSIS — I130 Hypertensive heart and chronic kidney disease with heart failure and stage 1 through stage 4 chronic kidney disease, or unspecified chronic kidney disease: Secondary | ICD-10-CM | POA: Diagnosis not present

## 2020-01-09 DIAGNOSIS — Z6841 Body Mass Index (BMI) 40.0 and over, adult: Secondary | ICD-10-CM | POA: Diagnosis not present

## 2020-01-09 DIAGNOSIS — M353 Polymyalgia rheumatica: Secondary | ICD-10-CM | POA: Diagnosis not present

## 2020-01-09 DIAGNOSIS — D631 Anemia in chronic kidney disease: Secondary | ICD-10-CM | POA: Diagnosis not present

## 2020-01-09 DIAGNOSIS — M6281 Muscle weakness (generalized): Secondary | ICD-10-CM | POA: Diagnosis not present

## 2020-01-09 DIAGNOSIS — M48062 Spinal stenosis, lumbar region with neurogenic claudication: Secondary | ICD-10-CM | POA: Diagnosis not present

## 2020-01-09 DIAGNOSIS — G894 Chronic pain syndrome: Secondary | ICD-10-CM | POA: Diagnosis not present

## 2020-01-09 DIAGNOSIS — R262 Difficulty in walking, not elsewhere classified: Secondary | ICD-10-CM | POA: Diagnosis not present

## 2020-01-09 DIAGNOSIS — N183 Chronic kidney disease, stage 3 unspecified: Secondary | ICD-10-CM | POA: Diagnosis not present

## 2020-01-09 DIAGNOSIS — M961 Postlaminectomy syndrome, not elsewhere classified: Secondary | ICD-10-CM | POA: Diagnosis not present

## 2020-01-09 DIAGNOSIS — K5900 Constipation, unspecified: Secondary | ICD-10-CM | POA: Diagnosis not present

## 2020-01-09 DIAGNOSIS — I5042 Chronic combined systolic (congestive) and diastolic (congestive) heart failure: Secondary | ICD-10-CM | POA: Diagnosis not present

## 2020-01-14 DIAGNOSIS — N183 Chronic kidney disease, stage 3 unspecified: Secondary | ICD-10-CM | POA: Diagnosis not present

## 2020-01-14 DIAGNOSIS — G894 Chronic pain syndrome: Secondary | ICD-10-CM | POA: Diagnosis not present

## 2020-01-14 DIAGNOSIS — I5042 Chronic combined systolic (congestive) and diastolic (congestive) heart failure: Secondary | ICD-10-CM | POA: Diagnosis not present

## 2020-01-14 DIAGNOSIS — I130 Hypertensive heart and chronic kidney disease with heart failure and stage 1 through stage 4 chronic kidney disease, or unspecified chronic kidney disease: Secondary | ICD-10-CM | POA: Diagnosis not present

## 2020-01-14 DIAGNOSIS — M961 Postlaminectomy syndrome, not elsewhere classified: Secondary | ICD-10-CM | POA: Diagnosis not present

## 2020-01-14 DIAGNOSIS — M48062 Spinal stenosis, lumbar region with neurogenic claudication: Secondary | ICD-10-CM | POA: Diagnosis not present

## 2020-01-20 DIAGNOSIS — G894 Chronic pain syndrome: Secondary | ICD-10-CM | POA: Diagnosis not present

## 2020-01-20 DIAGNOSIS — I130 Hypertensive heart and chronic kidney disease with heart failure and stage 1 through stage 4 chronic kidney disease, or unspecified chronic kidney disease: Secondary | ICD-10-CM | POA: Diagnosis not present

## 2020-01-20 DIAGNOSIS — M48062 Spinal stenosis, lumbar region with neurogenic claudication: Secondary | ICD-10-CM | POA: Diagnosis not present

## 2020-01-20 DIAGNOSIS — N183 Chronic kidney disease, stage 3 unspecified: Secondary | ICD-10-CM | POA: Diagnosis not present

## 2020-01-20 DIAGNOSIS — I5042 Chronic combined systolic (congestive) and diastolic (congestive) heart failure: Secondary | ICD-10-CM | POA: Diagnosis not present

## 2020-01-20 DIAGNOSIS — M961 Postlaminectomy syndrome, not elsewhere classified: Secondary | ICD-10-CM | POA: Diagnosis not present

## 2020-01-21 DIAGNOSIS — I5032 Chronic diastolic (congestive) heart failure: Secondary | ICD-10-CM | POA: Diagnosis not present

## 2020-01-21 DIAGNOSIS — B0229 Other postherpetic nervous system involvement: Secondary | ICD-10-CM | POA: Diagnosis not present

## 2020-01-21 DIAGNOSIS — M48061 Spinal stenosis, lumbar region without neurogenic claudication: Secondary | ICD-10-CM | POA: Diagnosis not present

## 2020-01-23 DIAGNOSIS — M48062 Spinal stenosis, lumbar region with neurogenic claudication: Secondary | ICD-10-CM | POA: Diagnosis not present

## 2020-01-23 DIAGNOSIS — I5042 Chronic combined systolic (congestive) and diastolic (congestive) heart failure: Secondary | ICD-10-CM | POA: Diagnosis not present

## 2020-01-23 DIAGNOSIS — N183 Chronic kidney disease, stage 3 unspecified: Secondary | ICD-10-CM | POA: Diagnosis not present

## 2020-01-23 DIAGNOSIS — G894 Chronic pain syndrome: Secondary | ICD-10-CM | POA: Diagnosis not present

## 2020-01-23 DIAGNOSIS — I130 Hypertensive heart and chronic kidney disease with heart failure and stage 1 through stage 4 chronic kidney disease, or unspecified chronic kidney disease: Secondary | ICD-10-CM | POA: Diagnosis not present

## 2020-01-23 DIAGNOSIS — M961 Postlaminectomy syndrome, not elsewhere classified: Secondary | ICD-10-CM | POA: Diagnosis not present

## 2020-01-28 DIAGNOSIS — N183 Chronic kidney disease, stage 3 unspecified: Secondary | ICD-10-CM | POA: Diagnosis not present

## 2020-01-28 DIAGNOSIS — M961 Postlaminectomy syndrome, not elsewhere classified: Secondary | ICD-10-CM | POA: Diagnosis not present

## 2020-01-28 DIAGNOSIS — I5042 Chronic combined systolic (congestive) and diastolic (congestive) heart failure: Secondary | ICD-10-CM | POA: Diagnosis not present

## 2020-01-28 DIAGNOSIS — M48062 Spinal stenosis, lumbar region with neurogenic claudication: Secondary | ICD-10-CM | POA: Diagnosis not present

## 2020-01-28 DIAGNOSIS — G894 Chronic pain syndrome: Secondary | ICD-10-CM | POA: Diagnosis not present

## 2020-01-28 DIAGNOSIS — I130 Hypertensive heart and chronic kidney disease with heart failure and stage 1 through stage 4 chronic kidney disease, or unspecified chronic kidney disease: Secondary | ICD-10-CM | POA: Diagnosis not present

## 2020-02-02 DIAGNOSIS — I5042 Chronic combined systolic (congestive) and diastolic (congestive) heart failure: Secondary | ICD-10-CM | POA: Diagnosis not present

## 2020-02-02 DIAGNOSIS — M961 Postlaminectomy syndrome, not elsewhere classified: Secondary | ICD-10-CM | POA: Diagnosis not present

## 2020-02-02 DIAGNOSIS — I130 Hypertensive heart and chronic kidney disease with heart failure and stage 1 through stage 4 chronic kidney disease, or unspecified chronic kidney disease: Secondary | ICD-10-CM | POA: Diagnosis not present

## 2020-02-02 DIAGNOSIS — M48062 Spinal stenosis, lumbar region with neurogenic claudication: Secondary | ICD-10-CM | POA: Diagnosis not present

## 2020-02-02 DIAGNOSIS — N183 Chronic kidney disease, stage 3 unspecified: Secondary | ICD-10-CM | POA: Diagnosis not present

## 2020-02-02 DIAGNOSIS — G894 Chronic pain syndrome: Secondary | ICD-10-CM | POA: Diagnosis not present

## 2020-02-06 DIAGNOSIS — G894 Chronic pain syndrome: Secondary | ICD-10-CM | POA: Diagnosis not present

## 2020-02-06 DIAGNOSIS — M069 Rheumatoid arthritis, unspecified: Secondary | ICD-10-CM | POA: Diagnosis not present

## 2020-02-06 DIAGNOSIS — M353 Polymyalgia rheumatica: Secondary | ICD-10-CM | POA: Diagnosis not present

## 2020-02-06 DIAGNOSIS — M961 Postlaminectomy syndrome, not elsewhere classified: Secondary | ICD-10-CM | POA: Diagnosis not present

## 2020-02-06 DIAGNOSIS — I130 Hypertensive heart and chronic kidney disease with heart failure and stage 1 through stage 4 chronic kidney disease, or unspecified chronic kidney disease: Secondary | ICD-10-CM | POA: Diagnosis not present

## 2020-02-06 DIAGNOSIS — M48062 Spinal stenosis, lumbar region with neurogenic claudication: Secondary | ICD-10-CM | POA: Diagnosis not present

## 2020-02-06 DIAGNOSIS — I5042 Chronic combined systolic (congestive) and diastolic (congestive) heart failure: Secondary | ICD-10-CM | POA: Diagnosis not present

## 2020-02-06 DIAGNOSIS — I509 Heart failure, unspecified: Secondary | ICD-10-CM | POA: Diagnosis not present

## 2020-02-06 DIAGNOSIS — N183 Chronic kidney disease, stage 3 unspecified: Secondary | ICD-10-CM | POA: Diagnosis not present

## 2020-02-08 DIAGNOSIS — M48062 Spinal stenosis, lumbar region with neurogenic claudication: Secondary | ICD-10-CM | POA: Diagnosis not present

## 2020-02-08 DIAGNOSIS — M353 Polymyalgia rheumatica: Secondary | ICD-10-CM | POA: Diagnosis not present

## 2020-02-08 DIAGNOSIS — Z6841 Body Mass Index (BMI) 40.0 and over, adult: Secondary | ICD-10-CM | POA: Diagnosis not present

## 2020-02-08 DIAGNOSIS — I130 Hypertensive heart and chronic kidney disease with heart failure and stage 1 through stage 4 chronic kidney disease, or unspecified chronic kidney disease: Secondary | ICD-10-CM | POA: Diagnosis not present

## 2020-02-08 DIAGNOSIS — G894 Chronic pain syndrome: Secondary | ICD-10-CM | POA: Diagnosis not present

## 2020-02-08 DIAGNOSIS — I5042 Chronic combined systolic (congestive) and diastolic (congestive) heart failure: Secondary | ICD-10-CM | POA: Diagnosis not present

## 2020-02-08 DIAGNOSIS — M6281 Muscle weakness (generalized): Secondary | ICD-10-CM | POA: Diagnosis not present

## 2020-02-08 DIAGNOSIS — R262 Difficulty in walking, not elsewhere classified: Secondary | ICD-10-CM | POA: Diagnosis not present

## 2020-02-08 DIAGNOSIS — N183 Chronic kidney disease, stage 3 unspecified: Secondary | ICD-10-CM | POA: Diagnosis not present

## 2020-02-08 DIAGNOSIS — M961 Postlaminectomy syndrome, not elsewhere classified: Secondary | ICD-10-CM | POA: Diagnosis not present

## 2020-02-10 DIAGNOSIS — R945 Abnormal results of liver function studies: Secondary | ICD-10-CM | POA: Diagnosis not present

## 2020-02-10 DIAGNOSIS — R7309 Other abnormal glucose: Secondary | ICD-10-CM | POA: Diagnosis not present

## 2020-02-10 DIAGNOSIS — E538 Deficiency of other specified B group vitamins: Secondary | ICD-10-CM | POA: Diagnosis not present

## 2020-02-10 DIAGNOSIS — M069 Rheumatoid arthritis, unspecified: Secondary | ICD-10-CM | POA: Diagnosis not present

## 2020-02-10 DIAGNOSIS — M150 Primary generalized (osteo)arthritis: Secondary | ICD-10-CM | POA: Diagnosis not present

## 2020-02-10 DIAGNOSIS — B0229 Other postherpetic nervous system involvement: Secondary | ICD-10-CM | POA: Diagnosis not present

## 2020-02-12 DIAGNOSIS — I5042 Chronic combined systolic (congestive) and diastolic (congestive) heart failure: Secondary | ICD-10-CM | POA: Diagnosis not present

## 2020-02-12 DIAGNOSIS — G894 Chronic pain syndrome: Secondary | ICD-10-CM | POA: Diagnosis not present

## 2020-02-12 DIAGNOSIS — M961 Postlaminectomy syndrome, not elsewhere classified: Secondary | ICD-10-CM | POA: Diagnosis not present

## 2020-02-12 DIAGNOSIS — M48062 Spinal stenosis, lumbar region with neurogenic claudication: Secondary | ICD-10-CM | POA: Diagnosis not present

## 2020-02-12 DIAGNOSIS — N183 Chronic kidney disease, stage 3 unspecified: Secondary | ICD-10-CM | POA: Diagnosis not present

## 2020-02-12 DIAGNOSIS — I130 Hypertensive heart and chronic kidney disease with heart failure and stage 1 through stage 4 chronic kidney disease, or unspecified chronic kidney disease: Secondary | ICD-10-CM | POA: Diagnosis not present

## 2020-02-14 ENCOUNTER — Encounter: Payer: Self-pay | Admitting: Emergency Medicine

## 2020-02-14 ENCOUNTER — Ambulatory Visit
Admission: EM | Admit: 2020-02-14 | Discharge: 2020-02-14 | Disposition: A | Discharge status: Home / Self Care | Payer: Medicare Other | Attending: Emergency Medicine | Admitting: Emergency Medicine

## 2020-02-14 ENCOUNTER — Other Ambulatory Visit: Payer: Self-pay

## 2020-02-14 DIAGNOSIS — Z79899 Other long term (current) drug therapy: Secondary | ICD-10-CM | POA: Insufficient documentation

## 2020-02-14 DIAGNOSIS — R82998 Other abnormal findings in urine: Secondary | ICD-10-CM | POA: Diagnosis not present

## 2020-02-14 DIAGNOSIS — M5441 Lumbago with sciatica, right side: Secondary | ICD-10-CM | POA: Diagnosis not present

## 2020-02-14 DIAGNOSIS — G8929 Other chronic pain: Secondary | ICD-10-CM | POA: Diagnosis not present

## 2020-02-14 LAB — POCT URINALYSIS DIP (MANUAL ENTRY)
Bilirubin, UA: NEGATIVE
Glucose, UA: NEGATIVE mg/dL
Ketones, POC UA: NEGATIVE mg/dL
Nitrite, UA: NEGATIVE
Protein Ur, POC: NEGATIVE mg/dL
Spec Grav, UA: 1.015 (ref 1.010–1.025)
Urobilinogen, UA: 0.2 E.U./dL
pH, UA: 7 (ref 5.0–8.0)

## 2020-02-14 MED ORDER — MUPIROCIN 2 % EX OINT: 1.0000 "application " | TOPICAL_OINTMENT | Freq: Two times a day (BID) | CUTANEOUS | 0 refills | Status: DC

## 2020-02-14 MED ORDER — CYCLOBENZAPRINE HCL 7.5 MG PO TABS: 7.5000 mg | ORAL_TABLET | Freq: Two times a day (BID) | ORAL | 0 refills | Status: AC | PRN

## 2020-02-14 MED ORDER — PREDNISONE 5 MG PO TABS: 15.0000 mg | ORAL_TABLET | Freq: Every day | ORAL | 0 refills | Status: DC

## 2020-02-14 NOTE — ED Provider Notes (Signed)
[facility]    CSN: [id number] Arrival date & time: 02/14/20  1218      History   Chief Complaint Chief Complaint  Patient presents with  . Back Pain  . Leg Pain    HPI Nicole [name] is a 74 y.o. female.   Who presented to the urgent care with a complaint of right-sided lower back pain that radiated to the right leg for the past 3 days.  Denies any precipitating event.  Has a history of lumbar fusion couple years ago at L5-S1.  She localizes the pain to her lower back.  Described the pain as constant and achy and rated at 9 on a scale of 1-10. She has tried OTC medication without relief.  Her symptoms are made worse with movement.  Reports similar symptoms in the past and was prescribed Flexeril, gabapentin, low-dose prednisone and diclofenac with mild relief.  Reports symptom has recently been getting worse.  Denies chills, fever, nausea, vomiting, diarrhea.  The history is provided by the patient. No language interpreter was used.  Back Pain Associated symptoms: leg pain   Leg Pain Associated symptoms: back pain     Past Medical History:  Diagnosis Date  . AC (acromioclavicular) joint bone spurs    lt shoulder  . Anemia   . Arthritis   . Carpal tunnel syndrome, bilateral   . CHF (congestive heart failure) ([place])   . Collagen vascular disease ([place])   . Gastroesophageal reflux   . Headache    recent visit to ER @ [facility] for severe headache  . Hypertension   . Lumbar stenosis    Hx of ESIs by Dr. Nelva Bush  . Polyarthralgia   . Polymyalgia ([place])   . Shingles   . Spinal stenosis     Patient Active Problem List   Diagnosis Date Noted  . Diverticulitis 05/02/2018  . Diverticulitis of colon 05/01/2018  . Morbid obesity with BMI of 50.0-59.9, adult ([place]) 05/01/2018  . Chronic diastolic HF (heart failure) ([place]) 05/01/2018  . On prednisone therapy 05/31/2017  . Positive anti-CCP test 05/31/2017  . Rheumatoid factor positive 05/31/2017  . Memory loss  04/04/2017  . Polymyalgia rheumatica ([place]) 04/04/2017  . High risk medication use 12/07/2016  . DDD (degenerative disc disease), lumbar 09/27/2016  . HTN (hypertension) 06/05/2016  . Chronic pain syndrome 06/05/2016  . Acute diverticulitis 06/02/2016  . Diverticulitis large intestine 06/02/2016  . Spinal stenosis of lumbosacral region 02/29/2016  . Joint pain 02/29/2016  . Endometrial polyp 02/06/2014  . Postmenopausal bleeding 01/30/2014  . GERD (gastroesophageal reflux disease) 09/29/2013  . Spinal stenosis, thoracic 09/29/2013  . Shingles 09/29/2013  . Thoracic or lumbosacral neuritis or radiculitis, unspecified 05/04/2011  . Abnormality of gait 05/04/2011  . Muscle weakness (generalized) 05/04/2011  . Bilateral primary osteoarthritis of knee 04/07/2009  . KNEE PAIN 04/07/2009    Past Surgical History:  Procedure Laterality Date  . BACK SURGERY  07/05/2018, 07/2018   x2   . CHOLECYSTECTOMY    . COLONOSCOPY  06/11/2012   Procedure: COLONOSCOPY;  Surgeon: Jamesetta So, MD;  Location: [facility];  Service: Gastroenterology;  Laterality: N/A;  . HYSTEROSCOPY WITH D & C N/A 02/25/2014   Procedure: DILATATION AND CURETTAGE /HYSTEROSCOPY;  Surgeon: Florian Buff, MD;  Location: [facility];  Service: Gynecology;  Laterality: N/A;  . POLYPECTOMY N/A 02/25/2014   Procedure: POLYPECTOMY;  Surgeon: Florian Buff, MD;  Location: [facility];  Service: Gynecology;  Laterality: N/A;  .  RESECTION DISTAL CLAVICAL Right 03/26/2015   Procedure: OPEN DISTAL CLAVICAL RESECTION ;  Surgeon: Netta Cedars, MD;  Location: [facility];  Service: Orthopedics;  Laterality: Right;    OB History    Gravida  2   Para      Term      Preterm      AB      Living  2     SAB      TAB      Ectopic      Multiple      Live Births               Home Medications    Prior to Admission medications   Medication Sig Start Date End Date Taking? Authorizing Provider  acetaminophen (TYLENOL) 325 MG tablet  Take 2 tablets (650 mg total) by mouth every 6 (six) hours as needed for mild pain or headache (or Fever >/= 101). 05/06/18   Barton Dubois, MD  Ascorbic Acid (VITAMIN C PO) Take by mouth daily.    [provider]  cyclobenzaprine (FEXMID) 7.5 MG tablet Take 1 tablet (7.5 mg total) by mouth 2 (two) times daily as needed for up to 7 days for muscle spasms. 02/14/20 02/21/20  Sorina Derrig, Darrelyn Hillock, FNP  diclofenac (VOLTAREN) 75 MG EC tablet 2 (two) times daily. 07/09/19   [provider]  diclofenac sodium (VOLTAREN) 1 % GEL  05/08/19   [provider]  docusate sodium (COLACE) 100 MG capsule Take 100 mg by mouth as needed.     [provider]  furosemide (LASIX) 40 MG tablet Take 1 tablet (40 mg total) by mouth daily. Patient not taking: Reported on 10/03/2018 04/17/18 07/16/18  Herminio Commons, MD  gabapentin (NEURONTIN) 400 MG capsule 3 (three) times daily. 06/17/19   [provider]  Multiple Vitamins-Minerals (MULTIVITAMIN PO) Take by mouth daily.    [provider]  pantoprazole (PROTONIX) 40 MG tablet Take 40 mg by mouth daily.  08/20/13   Milton Ferguson, MD  potassium chloride (MICRO-K) 10 MEQ CR capsule Take 10 mEq by mouth daily.    [provider]  predniSONE (DELTASONE) 5 MG tablet Take 3 tablets (15 mg total) by mouth daily with breakfast for 5 days. 02/14/20 02/19/20  Salia Cangemi, Darrelyn Hillock, FNP  Simethicone (GAS-X PO) Take by mouth as needed.    [provider]  Tapentadol HCl (NUCYNTA) 100 MG TABS Take 150 mg by mouth 2 (two) times daily.     [provider]  torsemide (DEMADEX) 20 MG tablet Take 20 mg by mouth daily.    [provider]    Family History Family History  Problem Relation Age of Onset  . Hypertension Mother   . Pneumonia Father   . Arthritis Sister   . Diabetes Paternal Grandmother     Social History Social History   Tobacco Use  . Smoking status: Never Smoker  . Smokeless  tobacco: Never Used  Substance Use Topics  . Alcohol use: No  . Drug use: No     Allergies   Oxycontin [oxycodone hcl], Sulfa antibiotics, Tramadol, Penicillins, and Sulfasalazine   Review of Systems Review of Systems  Constitutional: Negative.   Respiratory: Negative.   Cardiovascular: Negative.   Musculoskeletal: Positive for back pain.  All other systems reviewed and are negative.    Physical Exam Triage Vital Signs ED Triage Vitals  Enc Vitals Group     BP 02/14/20 1236 (!) 144/91  Pulse Rate 02/14/20 1236 85     Resp 02/14/20 1236 17     Temp 02/14/20 1236 98.1 F (36.7 C)     Temp Source 02/14/20 1236 Oral     SpO2 02/14/20 1236 92 %     Weight --      Height --      Head Circumference --      Peak Flow --      Pain Score 02/14/20 1242 9     Pain Loc --      Pain Edu? --      Excl. in [place]? --    No data found.  Updated Vital Signs BP (!) 144/91 (BP Location: Left Arm)   Pulse 85   Temp 98.1 F (36.7 C) (Oral)   Resp 17   SpO2 92%   Visual Acuity Right Eye Distance:   Left Eye Distance:   Bilateral Distance:    Right Eye Near:   Left Eye Near:    Bilateral Near:     Physical Exam Vitals and nursing note reviewed.  Constitutional:      General: She is not in acute distress.    Appearance: Normal appearance. She is normal weight. She is not ill-appearing, toxic-appearing or diaphoretic.  Cardiovascular:     Rate and Rhythm: Normal rate and regular rhythm.     Pulses: Normal pulses.     Heart sounds: Normal heart sounds. No murmur. No friction rub. No gallop.   Pulmonary:     Effort: Pulmonary effort is normal. No respiratory distress.     Breath sounds: Normal breath sounds. No stridor. No wheezing, rhonchi or rales.  Chest:     Chest wall: No tenderness.  Musculoskeletal:     Lumbar back: Spasms and tenderness present.     Comments: Patient is unable to ambulate.  She is wheelchair-bound.  Patient seated on the stretcher in moderate  pain.  There is no surface trauma, abrasion, scar, ecchymosis laceration.  Tenderness to palpation.  No CVA to percussion.  Sensation intact.  Neurological:     Mental Status: She is alert.      UC Treatments / Results  Labs (all labs ordered are listed, but only abnormal results are displayed) Labs Reviewed  POCT URINALYSIS DIP (MANUAL ENTRY) - Abnormal; Notable for the following components:      Result Value   Blood, UA trace-intact (*)    Leukocytes, UA Trace (*)    All other components within normal limits  URINE CULTURE    EKG   Radiology No results found.  Procedures Procedures (including critical care time)  Medications Ordered in UC Medications - No data to display  Initial Impression / Assessment and Plan / UC Course  I have reviewed the triage vital signs and the nursing notes.  Pertinent labs & imaging results that were available during my care of the patient were reviewed by me and considered in my medical decision making (see chart for details).    Patient is stable for discharge.  Low-dose prednisone, and Flexeril were prescribed due to decreased kidney function. Was advised to follow up with PCP To return or go to ED for worsening symptoms  Final Clinical Impressions(s) / UC Diagnoses   Final diagnoses:  Chronic right-sided low back pain with right-sided sciatica     Discharge Instructions     POCT urine analysis showed trace of blood and trace of leukocyte.  Sample sent for culture.  Will call if result is  abnormal. Rest, ice and heat as needed Ensure adequate ROM as tolerated. Prescribed short-term prednisone  Prescribed flexeril  for muscle spasm.  Do not drive or operate heavy machinery while taking this medication Continue to take gabapentin, diclofenac gel and compound gel pain medication as prescribed Return here or go to ER if you have any new or worsening symptoms such as numbness/tingling of the inner thighs, loss of bladder or bowel  control, headache/blurry vision, nausea/vomiting, confusion/altered mental status, dizziness, weakness, passing out, imbalance, etc...      ED Prescriptions    Medication Sig Dispense Auth. Provider   cyclobenzaprine (FEXMID) 7.5 MG tablet Take 1 tablet (7.5 mg total) by mouth 2 (two) times daily as needed for up to 7 days for muscle spasms. 14 tablet Melquan Ernsberger S, FNP   predniSONE (DELTASONE) 5 MG tablet Take 3 tablets (15 mg total) by mouth daily with breakfast for 5 days. 15 tablet Dannah Ryles, Darrelyn Hillock, FNP     PDMP not reviewed this encounter.   Emerson Monte, FNP 02/14/20 1406

## 2020-02-14 NOTE — ED Triage Notes (Signed)
Pt here for right sided lower back pain with radiation to right leg x 3 days; pt denies injury but sts hx of back sx in past

## 2020-02-14 NOTE — Discharge Instructions (Addendum)
POCT urine analysis showed trace of blood and trace of leukocyte.  Sample sent for culture.  Will call if result is abnormal. Rest, ice and heat as needed Ensure adequate ROM as tolerated. Prescribed short-term prednisone  Prescribed flexeril  for muscle spasm.  Do not drive or operate heavy machinery while taking this medication Continue to take gabapentin, diclofenac gel and compound gel pain medication as prescribed Return here or go to ER if you have any new or worsening symptoms such as numbness/tingling of the inner thighs, loss of bladder or bowel control, headache/blurry vision, nausea/vomiting, confusion/altered mental status, dizziness, weakness, passing out, imbalance, etc..[name]

## 2020-02-15 LAB — URINE CULTURE: Special Requests: NORMAL

## 2020-02-18 ENCOUNTER — Telehealth: Payer: Self-pay | Admitting: Rheumatology

## 2020-02-18 DIAGNOSIS — M5136 Other intervertebral disc degeneration, lumbar region: Secondary | ICD-10-CM | POA: Diagnosis not present

## 2020-02-18 DIAGNOSIS — S32010A Wedge compression fracture of first lumbar vertebra, initial encounter for closed fracture: Secondary | ICD-10-CM | POA: Diagnosis not present

## 2020-02-18 DIAGNOSIS — M5441 Lumbago with sciatica, right side: Secondary | ICD-10-CM | POA: Diagnosis not present

## 2020-02-18 DIAGNOSIS — M47896 Other spondylosis, lumbar region: Secondary | ICD-10-CM | POA: Diagnosis not present

## 2020-02-18 DIAGNOSIS — Z882 Allergy status to sulfonamides status: Secondary | ICD-10-CM | POA: Diagnosis not present

## 2020-02-18 DIAGNOSIS — Z880 Allergy status to penicillin: Secondary | ICD-10-CM | POA: Diagnosis not present

## 2020-02-18 DIAGNOSIS — Z79899 Other long term (current) drug therapy: Secondary | ICD-10-CM | POA: Diagnosis not present

## 2020-02-18 DIAGNOSIS — M545 Low back pain: Secondary | ICD-10-CM | POA: Diagnosis not present

## 2020-02-18 DIAGNOSIS — M25512 Pain in left shoulder: Secondary | ICD-10-CM | POA: Diagnosis not present

## 2020-02-18 DIAGNOSIS — M1990 Unspecified osteoarthritis, unspecified site: Secondary | ICD-10-CM | POA: Diagnosis not present

## 2020-02-18 DIAGNOSIS — S32020A Wedge compression fracture of second lumbar vertebra, initial encounter for closed fracture: Secondary | ICD-10-CM | POA: Diagnosis not present

## 2020-02-18 DIAGNOSIS — Z886 Allergy status to analgesic agent status: Secondary | ICD-10-CM | POA: Diagnosis not present

## 2020-02-18 DIAGNOSIS — N189 Chronic kidney disease, unspecified: Secondary | ICD-10-CM | POA: Diagnosis not present

## 2020-02-18 DIAGNOSIS — G8929 Other chronic pain: Secondary | ICD-10-CM | POA: Diagnosis not present

## 2020-02-18 DIAGNOSIS — Z885 Allergy status to narcotic agent status: Secondary | ICD-10-CM | POA: Diagnosis not present

## 2020-02-18 NOTE — Telephone Encounter (Signed)
Patient's son [name] left a voicemail stating patient recently had some muscle spasms and pain and went to urgent care.  She was prescribed an increased dosage of Prednisone 15 mg for 5 days.   [name] states yesterday, 02/17/20 she went back to her normal dosage and today has increased pain.  [name] is requesting a return call for some guidance.  [phone]

## 2020-02-18 NOTE — Telephone Encounter (Signed)
Advised patient's son to increase prednisone to 10 mg p.o. daily and stay on that dose until the follow-up visit. He verbalized understanding. Provided son with follow up appointment details. Patient states they are still in the ER at [facility] in [place] and patient currently has a pain medication patch on her back. He will call with any changes.

## 2020-02-18 NOTE — Telephone Encounter (Signed)
Please advise them to increase prednisone to 10 mg p.o. daily and stay on that dose until the follow-up visit.

## 2020-02-18 NOTE — Telephone Encounter (Signed)
Spoke with patient's son and patient is in excruciating pain when transferring to and from chair. Patient was taken to urgent care over the weekend and prescribed prednisone 15mg  po qd x 5 days (with today being the last day at that dose) and cyclobenzaprine 7.5 mg BID. Per son, Urgent care did a UA to check for a kidney infection and the UA came back inconclusive. Dr. Hilma Favors prescribed an antibiotic for patient yesterday. Per patient's son, she was doing better last night and then this morning she was very tearful and complaining of back pain and muscle spasms. He is currently taking patient to the ER, which is what was recommended by urgent care.   Patient's son would like to know if patient should stay on increased dose of prednisone or go back to normal dose of 5mg  po qd?

## 2020-02-19 ENCOUNTER — Other Ambulatory Visit (HOSPITAL_COMMUNITY): Payer: Self-pay | Admitting: Family Medicine

## 2020-02-19 ENCOUNTER — Other Ambulatory Visit: Payer: Self-pay | Admitting: Family Medicine

## 2020-02-19 ENCOUNTER — Other Ambulatory Visit: Payer: Self-pay | Admitting: Rheumatology

## 2020-02-19 DIAGNOSIS — M5135 Other intervertebral disc degeneration, thoracolumbar region: Secondary | ICD-10-CM

## 2020-02-19 DIAGNOSIS — G894 Chronic pain syndrome: Secondary | ICD-10-CM | POA: Diagnosis not present

## 2020-02-19 DIAGNOSIS — N183 Chronic kidney disease, stage 3 unspecified: Secondary | ICD-10-CM | POA: Diagnosis not present

## 2020-02-19 DIAGNOSIS — I130 Hypertensive heart and chronic kidney disease with heart failure and stage 1 through stage 4 chronic kidney disease, or unspecified chronic kidney disease: Secondary | ICD-10-CM | POA: Diagnosis not present

## 2020-02-19 DIAGNOSIS — M961 Postlaminectomy syndrome, not elsewhere classified: Secondary | ICD-10-CM | POA: Diagnosis not present

## 2020-02-19 DIAGNOSIS — M48062 Spinal stenosis, lumbar region with neurogenic claudication: Secondary | ICD-10-CM | POA: Diagnosis not present

## 2020-02-19 DIAGNOSIS — I5042 Chronic combined systolic (congestive) and diastolic (congestive) heart failure: Secondary | ICD-10-CM | POA: Diagnosis not present

## 2020-02-19 NOTE — Telephone Encounter (Signed)
Last Visit: 10/28/2019 telemedicine  Next Visit: 03/01/2020  Per telephone encounter from 02/18/2020, increase prednisone to 10 mg p.o. daily and stay on that dose until the follow-up visit.  Okay to refill per Dr. Estanislado Pandy.

## 2020-02-20 DIAGNOSIS — M48061 Spinal stenosis, lumbar region without neurogenic claudication: Secondary | ICD-10-CM | POA: Diagnosis not present

## 2020-02-20 DIAGNOSIS — I5032 Chronic diastolic (congestive) heart failure: Secondary | ICD-10-CM | POA: Diagnosis not present

## 2020-02-20 DIAGNOSIS — B0229 Other postherpetic nervous system involvement: Secondary | ICD-10-CM | POA: Diagnosis not present

## 2020-02-20 DIAGNOSIS — M353 Polymyalgia rheumatica: Secondary | ICD-10-CM | POA: Diagnosis not present

## 2020-02-23 NOTE — Progress Notes (Signed)
Office Visit Note  Patient: Nicole [name]             Date of Birth: 21-Dec-1945           MRN: [id number]             PCP: Sharilyn Sites, MD Referring: Sharilyn Sites, MD Visit Date: 03/01/2020 Occupation: '@GUAROCC' @  Subjective:   generalized pain   History of Present Illness: Nicole [name] is a 74 y.o. female with history of polymyalgia rheumatica and osteoarthritis.  Patient is experiencing severe generalized pain.  She states that for the past 2-week she has had severe midline low back pain with right-sided sciatica.  She was evaluated in urgent care followed by an ED recently.  According to the patient she was started on prednisone 15 mg by mouth daily for 5 days and was taking Flexeril twice daily for pain relief.  She currently rates her lower back pain a 9 out of 10.  She states that she is been experiencing increased weakness in her right lower extremity.  She is also having pain in both shoulder joints.  She is also been experiencing increased pain in both hands and intermittent joint swelling.     Activities of Daily Living:  Patient reports morning stiffness for 1 hour.   Patient Denies nocturnal pain.  Difficulty dressing/grooming: Reports Difficulty climbing stairs: Reports Difficulty getting out of chair: Reports Difficulty using hands for taps, buttons, cutlery, and/or writing: Reports  Review of Systems  Constitutional: Positive for fatigue.  HENT: Positive for mouth dryness. Negative for mouth sores and nose dryness.   Eyes: Positive for dryness. Negative for pain and visual disturbance.  Respiratory: Negative for cough, hemoptysis, shortness of breath and difficulty breathing.   Cardiovascular: Positive for swelling in legs/feet. Negative for chest pain, palpitations and hypertension.  Gastrointestinal: Negative for blood in stool, constipation and diarrhea.  Endocrine: Negative for increased urination.  Genitourinary: Negative for difficulty urinating and painful  urination.  Musculoskeletal: Positive for arthralgias, joint pain, joint swelling, muscle weakness, morning stiffness and muscle tenderness. Negative for myalgias and myalgias.  Skin: Positive for rash. Negative for color change, pallor, hair loss, skin tightness, ulcers and sensitivity to sunlight.  Allergic/Immunologic: Negative for susceptible to infections.  Neurological: Negative for dizziness and headaches.  Hematological: Negative for bruising/bleeding tendency and swollen glands.  Psychiatric/Behavioral: Negative for depressed mood and sleep disturbance. The patient is not nervous/anxious.     PMFS History:  Patient Active Problem List   Diagnosis Date Noted  . Diverticulitis 05/02/2018  . Diverticulitis of colon 05/01/2018  . Morbid obesity with BMI of 50.0-59.9, adult ([place]) 05/01/2018  . Chronic diastolic HF (heart failure) ([place]) 05/01/2018  . On prednisone therapy 05/31/2017  . Positive anti-CCP test 05/31/2017  . Rheumatoid factor positive 05/31/2017  . Memory loss 04/04/2017  . Polymyalgia rheumatica ([place]) 04/04/2017  . High risk medication use 12/07/2016  . DDD (degenerative disc disease), lumbar 09/27/2016  . HTN (hypertension) 06/05/2016  . Chronic pain syndrome 06/05/2016  . Acute diverticulitis 06/02/2016  . Diverticulitis large intestine 06/02/2016  . Spinal stenosis of lumbosacral region 02/29/2016  . Joint pain 02/29/2016  . Endometrial polyp 02/06/2014  . Postmenopausal bleeding 01/30/2014  . GERD (gastroesophageal reflux disease) 09/29/2013  . Spinal stenosis, thoracic 09/29/2013  . Shingles 09/29/2013  . Thoracic or lumbosacral neuritis or radiculitis, unspecified 05/04/2011  . Abnormality of gait 05/04/2011  . Muscle weakness (generalized) 05/04/2011  . Bilateral primary osteoarthritis of knee 04/07/2009  . KNEE  PAIN 04/07/2009    Past Medical History:  Diagnosis Date  . AC (acromioclavicular) joint bone spurs    lt shoulder  . Anemia   .  Arthritis   . Carpal tunnel syndrome, bilateral   . CHF (congestive heart failure) ([place])   . Collagen vascular disease ([place])   . Gastroesophageal reflux   . Headache    recent visit to ER @ [facility] for severe headache  . Hypertension   . Lumbar stenosis    Hx of ESIs by Dr. Nelva Bush  . Polyarthralgia   . Polymyalgia ([place])   . Shingles   . Spinal stenosis     Family History  Problem Relation Age of Onset  . Hypertension Mother   . Pneumonia Father   . Arthritis Sister   . Diabetes Paternal Grandmother    Past Surgical History:  Procedure Laterality Date  . BACK SURGERY  07/05/2018, 07/2018   x2   . CHOLECYSTECTOMY    . COLONOSCOPY  06/11/2012   Procedure: COLONOSCOPY;  Surgeon: Jamesetta So, MD;  Location: [facility];  Service: Gastroenterology;  Laterality: N/A;  . HYSTEROSCOPY WITH D & C N/A 02/25/2014   Procedure: DILATATION AND CURETTAGE /HYSTEROSCOPY;  Surgeon: Florian Buff, MD;  Location: [facility];  Service: Gynecology;  Laterality: N/A;  . POLYPECTOMY N/A 02/25/2014   Procedure: POLYPECTOMY;  Surgeon: Florian Buff, MD;  Location: [facility];  Service: Gynecology;  Laterality: N/A;  . RESECTION DISTAL CLAVICAL Right 03/26/2015   Procedure: OPEN DISTAL CLAVICAL RESECTION ;  Surgeon: Netta Cedars, MD;  Location: [facility];  Service: Orthopedics;  Laterality: Right;   Social History   Social History Narrative   Lives at home alone.   Right-handed.   1-2 cups caffeine daily.   Immunization History  Administered Date(s) Administered  . Influenza,inj,quad, With Preservative 07/23/2017     Objective: Vital Signs: BP 125/77 (BP Location: Left Arm, Patient Position: Sitting, Cuff Size: Normal)   Pulse 88   Resp 18   Ht 5' (1.524 m)   Wt 247 lb 3.2 oz (112.1 kg)   BMI 48.28 kg/m    Physical Exam Vitals and nursing note reviewed.  Constitutional:      Appearance: She is well-developed.  HENT:     Head: Normocephalic and atraumatic.  Eyes:     Conjunctiva/sclera:  Conjunctivae normal.  Pulmonary:     Effort: Pulmonary effort is normal.  Abdominal:     General: Bowel sounds are normal.     Palpations: Abdomen is soft.  Musculoskeletal:     Cervical back: Normal range of motion.  Lymphadenopathy:     Cervical: No cervical adenopathy.  Skin:    General: Skin is warm and dry.     Capillary Refill: Capillary refill takes less than 2 seconds.  Neurological:     Mental Status: She is alert and oriented to person, place, and time.  Psychiatric:        Behavior: Behavior normal.      Musculoskeletal Exam: Wheelchair bound. Generalized hyperalgesia noted.  C-spine painful and limited ROM.  Thoracic kyphosis noted.  Did not assess lumbar spine ROM while in wheelchair.  Shoulder joint abduction to about 90 degrees.  Elbow joint and wrist joints good ROM with no tenderness or inflammation.  Tenderness of bilateral 2nd, 3rd, and 4th  MCP joints but no synovitis noted.  Hip joints difficult to assess in wheelchair.   Unable to ambulate. Left knee has full extension with no  warmth or effusion.  Painful ROM of the right knee joint but no warmth or effusion noted.  Pedal edema bilaterally.    CDAI Exam: CDAI Score: -- Patient Global: --; Provider Global: -- Swollen: 0 ; Tender: 6  Joint Exam 03/01/2020      Right  Left  MCP 2   Tender   Tender  MCP 3   Tender   Tender  MCP 4   Tender   Tender     Investigation: No additional findings.  Imaging: No results found.  Recent Labs: Lab Results  Component Value Date   WBC 11.3 (H) 10/30/2019   HGB 12.7 10/30/2019   PLT 359 10/30/2019   NA 139 10/30/2019   K 4.6 10/30/2019   CL 99 10/30/2019   CO2 24 10/30/2019   GLUCOSE 94 10/30/2019   BUN 14 10/30/2019   CREATININE 1.40 (H) 10/30/2019   BILITOT 0.3 10/30/2019   ALKPHOS 158 (H) 10/30/2019   AST 36 10/30/2019   ALT 36 (H) 10/30/2019   PROT 7.1 10/30/2019   ALBUMIN 4.1 10/30/2019   CALCIUM 9.6 10/30/2019   GFRAA 43 (L) 10/30/2019     Speciality Comments: PLQ Eye Exam: 12/13/17 WNl @ [facility]  Procedures:  No procedures performed Allergies: Oxycontin [oxycodone hcl], Sulfa antibiotics, Tramadol, Penicillins, and Sulfasalazine   Assessment / Plan:     Visit Diagnoses: Temporal arteritis ([place]): No temporal artery tenderness.  She has not had any recent headaches.  No recurrence of symptoms.  She is currently taking arava 10 mg 1 tablet by mouth daily. She was advised to notify us if she develops signs or symptoms of a flare.   Polymyalgia rheumatica ([place]): She is having increased pain in both shoulder joints, lower back, and both hips.  She is unable to ambulate and is wheelchair bound.  She has limited abduction of both shoulders to about 90 degrees bilaterally with discomfort.  She has been experiencing severe thoracic and lumbar spine pain for the past 2 weeks.  She was experiencing right-sided sciatica and was evaluated at urgent care and the emergency department.  She was advised to increase prednisone from 5 mg daily to 15 mg daily.  She has reduced her prednisone to 10 mg by mouth daily.  She is scheduled for MRI of the thoracic and lumbar spine on 03/16/2020.  Her lower back pain is currently a 9 out of 10 and she continues to have right-sided sciatica.  She is been experiencing increased generalized myalgias and muscle tenderness for several weeks.  She is also experiencing increased muscle weakness but is unclear if it is due to PMR or her lower back pain.  Her sed rate remains chronically elevated and was 70 on 10/30/2019.  We discussed restarting on Arava 10 mg 1 tablet by mouth daily.  She previously developed a rash under her breast while taking Arava 20 mg daily which was likely due to heat reaction.  Indications, contraindications, and potential side effects of Arava were discussed.  Consent was obtained.  A prescription for arava 10 mg by mouth daily will be sent to the pharmacy today.  She will return for lab  work in 2 weeks.  She will continue taking prednisone 10 mg daily. She will follow up in 4 weeks to reevaluate.   Medication counseling:  Patient was counseled on the purpose, proper use, and adverse effects of leflunomide including risk of infection, nausea/diarrhea/weight loss, increase in blood pressure, rash, hair loss, tingling in the  hands and feet, and signs and symptoms of interstitial lung disease.   Also counseled on Black Box warning of liver injury and importance of avoiding alcohol while on therapy. Discussed that there is the possibility of an increased risk of malignancy but it is not well understood if this increased risk is due to the medication or the disease state.  Counseled patient to avoid live vaccines. Recommend annual influenza, Pneumovax 23, Prevnar 13, and Shingrix as indicated.   Discussed the importance of frequent monitoring of liver function and blood count.  Standing orders placed.  Discussed importance of birth control while on leflunomide due to risk of congenital abnormalities, and patient confirms she is postmenopausal.  Provided patient with educational materials on leflunomide and answered all questions.  Patient consented to [place] use, and consent will be uploaded into the media tab.   Patient dose will be 10 mg 1 tablet daily.  Prescription pending lab results and/or insurance approval.  High risk medication use - Prednisone 10 mg po daily.  She will be restarting Arava 10 mg 1 tablet daily.  CBC WNL on 02/10/20.  Creatinine elevated-1.75 and GFR low-33 on 02/10/20.  LFTs WNL.  Alk phos remains elevated.  She will be due to update lab work 2 weeks after restarting on Arava.  Standing orders were provided to the patient.   On prednisone therapy: Prednisone 10 mg po daily.   Rheumatoid factor positive: She has tenderness of bilateral 2nd, 3rd, and 4th MCP joints but no synovitis was noted. She has been experiencing increased pain and stiffness in both hands.  She is  currently taking prednisone 10 mg po daily.  She will restarting on Arava 10 mg 1 tablet by mouth daily.   Positive anti-CCP test: No synovitis on exam.  She has been experiencing increased pain and stiffness in both hands.    Chronic pain syndrome: She is taking Nucynta for pain relief.   Primary osteoarthritis of both knees:  She has good ROM of the left knee joint on exam.  No warmth or effusion of knee joints noted.   DDD (degenerative disc disease), lumbar:  Acute on chronic pain.  She is currently experiencing rgith sided sciatica. Her pain is a 9/10 currently.  She is unable to ambulate and is using a wheelchair for assistance. She is scheduled for a MRI of the lumbar spine on 03/16/20.    Spinal stenosis, thoracic: She is scheduled for a MRI of the thoracic spine on 03/16/20.   Other medical conditions are listed as follows:   Vitamin D deficiency  History of diverticulitis  History of hypertension  History of gastroesophageal reflux (GERD)  Orders: No orders of the defined types were placed in this encounter.  No orders of the defined types were placed in this encounter.   Face-to-face time spent with patient was 30 minutes. Greater than 50% of time was spent in counseling and coordination of care.  Follow-Up Instructions: Return in about 4 weeks (around 03/29/2020) for Polymyalgia Rheumatica.   Ofilia Neas, PA-C  Note - This record has been created using Dragon software.  Chart creation errors have been sought, but may not always  have been located. Such creation errors do not reflect on  the standard of medical care.,

## 2020-02-24 DIAGNOSIS — N183 Chronic kidney disease, stage 3 unspecified: Secondary | ICD-10-CM | POA: Diagnosis not present

## 2020-02-24 DIAGNOSIS — I130 Hypertensive heart and chronic kidney disease with heart failure and stage 1 through stage 4 chronic kidney disease, or unspecified chronic kidney disease: Secondary | ICD-10-CM | POA: Diagnosis not present

## 2020-02-24 DIAGNOSIS — I5042 Chronic combined systolic (congestive) and diastolic (congestive) heart failure: Secondary | ICD-10-CM | POA: Diagnosis not present

## 2020-02-24 DIAGNOSIS — M48062 Spinal stenosis, lumbar region with neurogenic claudication: Secondary | ICD-10-CM | POA: Diagnosis not present

## 2020-02-24 DIAGNOSIS — M961 Postlaminectomy syndrome, not elsewhere classified: Secondary | ICD-10-CM | POA: Diagnosis not present

## 2020-02-24 DIAGNOSIS — G894 Chronic pain syndrome: Secondary | ICD-10-CM | POA: Diagnosis not present

## 2020-02-25 DIAGNOSIS — I130 Hypertensive heart and chronic kidney disease with heart failure and stage 1 through stage 4 chronic kidney disease, or unspecified chronic kidney disease: Secondary | ICD-10-CM | POA: Diagnosis not present

## 2020-02-25 DIAGNOSIS — I5042 Chronic combined systolic (congestive) and diastolic (congestive) heart failure: Secondary | ICD-10-CM | POA: Diagnosis not present

## 2020-02-25 DIAGNOSIS — M353 Polymyalgia rheumatica: Secondary | ICD-10-CM | POA: Diagnosis not present

## 2020-02-25 DIAGNOSIS — M961 Postlaminectomy syndrome, not elsewhere classified: Secondary | ICD-10-CM | POA: Diagnosis not present

## 2020-02-25 DIAGNOSIS — M6281 Muscle weakness (generalized): Secondary | ICD-10-CM | POA: Diagnosis not present

## 2020-02-25 DIAGNOSIS — G894 Chronic pain syndrome: Secondary | ICD-10-CM | POA: Diagnosis not present

## 2020-02-25 DIAGNOSIS — M48062 Spinal stenosis, lumbar region with neurogenic claudication: Secondary | ICD-10-CM | POA: Diagnosis not present

## 2020-02-25 DIAGNOSIS — N183 Chronic kidney disease, stage 3 unspecified: Secondary | ICD-10-CM | POA: Diagnosis not present

## 2020-02-26 DIAGNOSIS — I130 Hypertensive heart and chronic kidney disease with heart failure and stage 1 through stage 4 chronic kidney disease, or unspecified chronic kidney disease: Secondary | ICD-10-CM | POA: Diagnosis not present

## 2020-02-26 DIAGNOSIS — G894 Chronic pain syndrome: Secondary | ICD-10-CM | POA: Diagnosis not present

## 2020-02-26 DIAGNOSIS — M961 Postlaminectomy syndrome, not elsewhere classified: Secondary | ICD-10-CM | POA: Diagnosis not present

## 2020-02-26 DIAGNOSIS — M48062 Spinal stenosis, lumbar region with neurogenic claudication: Secondary | ICD-10-CM | POA: Diagnosis not present

## 2020-02-26 DIAGNOSIS — N183 Chronic kidney disease, stage 3 unspecified: Secondary | ICD-10-CM | POA: Diagnosis not present

## 2020-02-26 DIAGNOSIS — I5042 Chronic combined systolic (congestive) and diastolic (congestive) heart failure: Secondary | ICD-10-CM | POA: Diagnosis not present

## 2020-02-27 ENCOUNTER — Ambulatory Visit: Payer: TRICARE For Life (TFL) | Admitting: Student

## 2020-03-01 ENCOUNTER — Encounter: Payer: Self-pay | Admitting: Physician Assistant

## 2020-03-01 ENCOUNTER — Other Ambulatory Visit: Payer: Self-pay

## 2020-03-01 ENCOUNTER — Ambulatory Visit (INDEPENDENT_AMBULATORY_CARE_PROVIDER_SITE_OTHER): Payer: Medicare Other | Admitting: Physician Assistant

## 2020-03-01 VITALS — BP 125/77 | HR 88 | Resp 18 | Ht 60.0 in | Wt 247.2 lb

## 2020-03-01 DIAGNOSIS — G894 Chronic pain syndrome: Secondary | ICD-10-CM

## 2020-03-01 DIAGNOSIS — Z79899 Other long term (current) drug therapy: Secondary | ICD-10-CM

## 2020-03-01 DIAGNOSIS — R7681 Abnormal rheumatoid factor and anti-citrullinated protein antibody without rheumatoid arthritis: Secondary | ICD-10-CM

## 2020-03-01 DIAGNOSIS — M51369 Other intervertebral disc degeneration, lumbar region without mention of lumbar back pain or lower extremity pain: Secondary | ICD-10-CM

## 2020-03-01 DIAGNOSIS — M170 Bilateral primary osteoarthritis of knee: Secondary | ICD-10-CM | POA: Diagnosis not present

## 2020-03-01 DIAGNOSIS — R7689 Other specified abnormal immunological findings in serum: Secondary | ICD-10-CM

## 2020-03-01 DIAGNOSIS — E559 Vitamin D deficiency, unspecified: Secondary | ICD-10-CM

## 2020-03-01 DIAGNOSIS — M4804 Spinal stenosis, thoracic region: Secondary | ICD-10-CM | POA: Diagnosis not present

## 2020-03-01 DIAGNOSIS — M5136 Other intervertebral disc degeneration, lumbar region: Secondary | ICD-10-CM | POA: Diagnosis not present

## 2020-03-01 DIAGNOSIS — M316 Other giant cell arteritis: Secondary | ICD-10-CM | POA: Diagnosis not present

## 2020-03-01 DIAGNOSIS — Z8679 Personal history of other diseases of the circulatory system: Secondary | ICD-10-CM

## 2020-03-01 DIAGNOSIS — M353 Polymyalgia rheumatica: Secondary | ICD-10-CM

## 2020-03-01 DIAGNOSIS — R768 Other specified abnormal immunological findings in serum: Secondary | ICD-10-CM

## 2020-03-01 DIAGNOSIS — Z8719 Personal history of other diseases of the digestive system: Secondary | ICD-10-CM | POA: Diagnosis not present

## 2020-03-01 DIAGNOSIS — Z7952 Long term (current) use of systemic steroids: Secondary | ICD-10-CM

## 2020-03-01 NOTE — Patient Instructions (Signed)
Standing Labs We placed an order today for your standing lab work.    Please come back and get your standing labs in 2 weeks  We have open lab daily Monday through Thursday from 8:30-12:30 PM and 1:30-4:30 PM and Friday from 8:30-12:30 PM and 1:30-4:00 PM at the office of Dr. Bo Merino.   You may experience shorter wait times on Monday and Friday afternoons. The office is located at [address] [place], [place], [place] [address] No appointment is necessary.   Labs are drawn by [facility].  You may receive a bill from [facility] for your lab work.  If you wish to have your labs drawn at another location, please call the office 24 hours in advance to send orders.  If you have any questions regarding directions or hours of operation,  please call [phone].   Just as a reminder please drink plenty of water prior to coming for your lab work. Thanks!   Leflunomide tablets What is this medicine? LEFLUNOMIDE (le FLOO na mide) is for rheumatoid arthritis. This medicine may be used for other purposes; ask your health care provider or pharmacist if you have questions. COMMON BRAND NAME(S): Arava What should I tell my health care provider before I take this medicine? They need to know if you have any of these conditions:  diabetes  have a fever or infection  high blood pressure  immune system problems  kidney disease  liver disease  low blood cell counts, like low white cell, platelet, or red cell counts  lung or breathing disease, like asthma  recently received or scheduled to receive a vaccine  receiving treatment for cancer  skin conditions or sensitivity  tingling of the fingers or toes, or other nerve disorder  tuberculosis  an unusual or allergic reaction to leflunomide, teriflunomide, other medicines, food, dyes, or preservatives  pregnant or trying to get pregnant  breast-feeding How should I use this medicine? Take this medicine by mouth with a  full glass of water. Follow the directions on the prescription label. Take your medicine at regular intervals. Do not take your medicine more often than directed. Do not stop taking except on your doctor's advice. Talk to your pediatrician regarding the use of this medicine in children. Special care may be needed. Overdosage: If you think you have taken too much of this medicine contact a poison control center or emergency room at once. NOTE: This medicine is only for you. Do not share this medicine with others. What if I miss a dose? If you miss a dose, take it as soon as you can. If it is almost time for your next dose, take only that dose. Do not take double or extra doses. What may interact with this medicine? Do not take this medicine with any of the following medications:  teriflunomide This medicine may also interact with the following medications:  alosetron  birth control pills  caffeine  cefaclor  certain medicines for diabetes like nateglinide, repaglinide, rosiglitazone, pioglitazone  certain medicines for high cholesterol like atorvastatin, pravastatin, rosuvastatin, simvastatin  charcoal  cholestyramine  ciprofloxacin  duloxetine  furosemide  ketoprofen  live virus vaccines  medicines that increase your risk for infection  methotrexate  mitoxantrone  paclitaxel  penicillin  theophylline  tizanidine  warfarin This list may not describe all possible interactions. Give your health care provider a list of all the medicines, herbs, non-prescription drugs, or dietary supplements you use. Also tell them if you smoke, drink alcohol, or use  illegal drugs. Some items may interact with your medicine. What should I watch for while using this medicine? Visit your health care provider for regular checks on your progress. Tell your doctor or health care provider if your symptoms do not start to get better or if they get worse. You may need blood work done while  you are taking this medicine. This medicine may cause serious skin reactions. They can happen weeks to months after starting the medicine. Contact your health care provider right away if you notice fevers or flu-like symptoms with a rash. The rash may be red or purple and then turn into blisters or peeling of the skin. Or, you might notice a red rash with swelling of the face, lips or lymph nodes in your neck or under your arms. This medicine may stay in your body for up to 2 years after your last dose. Tell your doctor about any unusual side effects or symptoms. A medicine can be given to help lower your blood levels of this medicine more quickly. Women must use effective birth control with this medicine. There is a potential for serious side effects to an unborn child. Do not become pregnant while taking this medicine. Inform your doctor if you wish to become pregnant. This medicine remains in your blood after you stop taking it. You must continue using effective birth control until the blood levels have been checked and they are low enough. A medicine can be given to help lower your blood levels of this medicine more quickly. Immediately talk to your doctor if you think you may be pregnant. You may need a pregnancy test. Talk to your health care provider or pharmacist for more information. You should not receive certain vaccines during your treatment and for a certain time after your treatment with this medication ends. Talk to your health care provider for more information. What side effects may I notice from receiving this medicine? Side effects that you should report to your doctor or health care professional as soon as possible:  allergic reactions like skin rash, itching or hives, swelling of the face, lips, or tongue  breathing problems  cough  increased blood pressure  low blood counts - this medicine may decrease the number of white blood cells and platelets. You may be at increased risk  for infections and bleeding.  pain, tingling, numbness in the hands or feet  rash, fever, and swollen lymph nodes  redness, blistering, peeing or loosening of the skin, including inside the mouth  signs of decreased platelets or bleeding - bruising, pinpoint red spots on the skin, black, tarry stools, blood in urine  signs of infection - fever or chills, cough, sore throat, pain or trouble passing urine  signs and symptoms of liver injury like dark yellow or brown urine; general ill feeling or flu-like symptoms; light-colored stools; loss of appetite; nausea; right upper belly pain; unusually weak or tired; yellowing of the eyes or skin  trouble passing urine or change in the amount of urine  vomiting Side effects that usually do not require medical attention (report to your doctor or health care professional if they continue or are bothersome):  diarrhea  hair thinning or loss  headache  nausea  tiredness This list may not describe all possible side effects. Call your doctor for medical advice about side effects. You may report side effects to FDA at 1-800-FDA-1088. Where should I keep my medicine? Keep out of the reach of children. Store at room temperature  between 15 and 30 degrees C (59 and 86 degrees F). Protect from moisture and light. Throw away any unused medicine after the expiration date. NOTE: This sheet is a summary. It may not cover all possible information. If you have questions about this medicine, talk to your doctor, pharmacist, or health care provider.  2020 Elsevier/Gold Standard (2019-01-10 15:06:48)

## 2020-03-02 ENCOUNTER — Telehealth: Payer: Self-pay | Admitting: Rheumatology

## 2020-03-02 DIAGNOSIS — M48062 Spinal stenosis, lumbar region with neurogenic claudication: Secondary | ICD-10-CM | POA: Diagnosis not present

## 2020-03-02 DIAGNOSIS — M961 Postlaminectomy syndrome, not elsewhere classified: Secondary | ICD-10-CM | POA: Diagnosis not present

## 2020-03-02 DIAGNOSIS — I130 Hypertensive heart and chronic kidney disease with heart failure and stage 1 through stage 4 chronic kidney disease, or unspecified chronic kidney disease: Secondary | ICD-10-CM | POA: Diagnosis not present

## 2020-03-02 DIAGNOSIS — I5042 Chronic combined systolic (congestive) and diastolic (congestive) heart failure: Secondary | ICD-10-CM | POA: Diagnosis not present

## 2020-03-02 DIAGNOSIS — G894 Chronic pain syndrome: Secondary | ICD-10-CM | POA: Diagnosis not present

## 2020-03-02 DIAGNOSIS — N183 Chronic kidney disease, stage 3 unspecified: Secondary | ICD-10-CM | POA: Diagnosis not present

## 2020-03-02 NOTE — Telephone Encounter (Signed)
Patient called to let Lovena Le / Dr. Estanislado Pandy know she has 5 pills remaining of her Leflunomide 10 mg.  Patient is requesting a prescription refill to be sent to [facility].

## 2020-03-02 NOTE — Progress Notes (Deleted)
Cardiology Office Note  Date: 03/02/2020   ID: Nicole [name], DOB 23-Nov-1945, MRN [id number]  PCP:  Sharilyn Sites, MD  Cardiologist:  Rozann Lesches, MD Electrophysiologist:  None   Chief Complaint: Follow-up, CHF, near syncope, HTN  History of Present Illness: Nicole [name] is a 74 y.o. female with a history of CHF, near syncope, HTN, polymyalgia rheumatica.  Last seen by Dr. Bronson Ing on 04/17/2018 for evaluation of near syncope and CHF at request of PCP.  She is experiencing bilateral leg edema and exertional dyspnea.  She was taking Lasix 20 mg daily, losartan hydrochlorothiazide, with potassium supplementation.  An echocardiogram was ordered.     Past Medical History:  Diagnosis Date  . AC (acromioclavicular) joint bone spurs    lt shoulder  . Anemia   . Arthritis   . Carpal tunnel syndrome, bilateral   . CHF (congestive heart failure) ([place])   . Collagen vascular disease ([place])   . Gastroesophageal reflux   . Headache    recent visit to ER @ [facility] for severe headache  . Hypertension   . Lumbar stenosis    Hx of ESIs by Dr. Nelva Bush  . Polyarthralgia   . Polymyalgia ([place])   . Shingles   . Spinal stenosis     Past Surgical History:  Procedure Laterality Date  . BACK SURGERY  07/05/2018, 07/2018   x2   . CHOLECYSTECTOMY    . COLONOSCOPY  06/11/2012   Procedure: COLONOSCOPY;  Surgeon: Jamesetta So, MD;  [facility];  Service: Gastroenterology;  Laterality: N/A;  . HYSTEROSCOPY WITH D & C N/A 02/25/2014   Procedure: DILATATION AND CURETTAGE /HYSTEROSCOPY;  Surgeon: Florian Buff, MD;  Location: [facility];  Service: Gynecology;  Laterality: N/A;  . POLYPECTOMY N/A 02/25/2014   Procedure: POLYPECTOMY;  Surgeon: Florian Buff, MD;  Location: [facility];  Service: Gynecology;  Laterality: N/A;  . RESECTION DISTAL CLAVICAL Right 03/26/2015   Procedure: OPEN DISTAL CLAVICAL RESECTION ;  Surgeon: Netta Cedars, MD;  Location: [facility];  Service: Orthopedics;  Laterality:  Right;    Current Outpatient Medications  Medication Sig Dispense Refill  . acetaminophen (TYLENOL) 325 MG tablet Take 2 tablets (650 mg total) by mouth every 6 (six) hours as needed for mild pain or headache (or Fever >/= 101).    . Ascorbic Acid (VITAMIN C PO) Take by mouth daily.    . cyclobenzaprine (FLEXERIL) 5 MG tablet SMARTSIG:1 Tablet(s) By Mouth Every 12 Hours PRN    . diclofenac (VOLTAREN) 75 MG EC tablet 2 (two) times daily.    . diclofenac sodium (VOLTAREN) 1 % GEL     . docusate sodium (COLACE) 100 MG capsule Take 100 mg by mouth as needed.     . fluconazole (DIFLUCAN) 150 MG tablet Take 150 mg by mouth daily.    . furosemide (LASIX) 40 MG tablet Take 1 tablet (40 mg total) by mouth daily. (Patient not taking: Reported on 10/03/2018) 100 tablet 3  . gabapentin (NEURONTIN) 400 MG capsule 3 (three) times daily.    [name] lidocaine (LIDODERM) 5 % SMARTSIG:1-2 Patch(s) Topical Every 12 Hours PRN    . Multiple Vitamins-Minerals (MULTIVITAMIN PO) Take by mouth daily.    . mupirocin ointment (BACTROBAN) 2 % Apply 1 application topically 2 (two) times daily. 22 g 0  . NUCYNTA ER 150 MG TB12 SMARTSIG:1 Tablet(s) By Mouth Every 12 Hours    . pantoprazole (PROTONIX) 40 MG tablet Take 40 mg by mouth  daily.     . potassium chloride (MICRO-K) 10 MEQ CR capsule Take 10 mEq by mouth daily.    . predniSONE (DELTASONE) 5 MG tablet Take 2 tablets (10 mg total) by mouth daily with breakfast. 60 tablet 0  . Simethicone (GAS-X PO) Take by mouth as needed.    . torsemide (DEMADEX) 20 MG tablet Take 20 mg by mouth daily.     No current facility-administered medications for this visit.   Allergies:  Oxycontin [oxycodone hcl], Sulfa antibiotics, Tramadol, Penicillins, and Sulfasalazine   Social History: The patient  reports that she has never smoked. She has never used smokeless tobacco. She reports that she does not drink alcohol or use drugs.   Family History: The patient's family history includes  Arthritis in her sister; Diabetes in her paternal grandmother; Hypertension in her mother; Pneumonia in her father.   ROS:  Please see the history of present illness. Otherwise, complete review of systems is positive for {NONE DEFAULTED:18576::"none"}.  All other systems are reviewed and negative.   Physical Exam: VS:  There were no vitals taken for this visit., BMI There is no height or weight on file to calculate BMI.  Wt Readings from Last 3 Encounters:  03/01/20 247 lb 3.2 oz (112.1 kg)  08/18/19 266 lb 6.4 oz (120.8 kg)  07/15/19 264 lb (119.7 kg)    General: Patient appears comfortable at rest. HEENT: Conjunctiva and lids normal, oropharynx clear with moist mucosa. Neck: Supple, no elevated JVP or carotid bruits, no thyromegaly. Lungs: Clear to auscultation, nonlabored breathing at rest. Cardiac: Regular rate and rhythm, no S3 or significant systolic murmur, no pericardial rub. Abdomen: Soft, nontender, no hepatomegaly, bowel sounds present, no guarding or rebound. Extremities: No pitting edema, distal pulses 2+. Skin: Warm and dry. Musculoskeletal: No kyphosis. Neuropsychiatric: Alert and oriented x3, affect grossly appropriate.  ECG:  {EKG/Telemetry Strips Reviewed:[phone]}  Recent Labwork: 10/30/2019: ALT 36; AST 36; BUN 14; Creatinine, Ser 1.40; Hemoglobin 12.7; Platelets 359; Potassium 4.6; Sodium 139  No results found for: CHOL, TRIG, HDL, CHOLHDL, VLDL, LDLCALC, LDLDIRECT  Other Studies Reviewed Today:  Echocardiogram 04/24/2018 Study Conclusions  - Left ventricle: The cavity size was normal. Systolic function was  vigorous. The estimated ejection fraction was in the range of 65%  to 70%. Wall motion was normal; there were no regional wall  motion abnormalities. Doppler parameters are consistent with  abnormal left ventricular relaxation (grade 1 diastolic  dysfunction). Doppler parameters are consistent with high  ventricular filling pressure. Mild  concentric and moderate basal  septal hypertrophy.  - Aortic valve: Moderately calcified annulus. Trileaflet. Valve  area (VTI): 2.08 cm^2. Valve area (Vmax): 2.11 cm^2. Valve area  (Vmean): 2.4 cm^2.  - Mitral valve: Calcified annulus. Mildly thickened leaflets .   Assessment and Plan:  1. Chronic diastolic heart failure ([place])   2. Essential hypertension   3. Near syncope   4. PMR (polymyalgia rheumatica) (HCC)    1. Chronic diastolic heart failure (HCC) ***  2. Essential hypertension ***  3. Near syncope ***  4. PMR (polymyalgia rheumatica) (HCC) ***  Medication Adjustments/Labs and Tests Ordered: Current medicines are reviewed at length with the patient today.  Concerns regarding medicines are outlined above.   Disposition: Follow-up with ***  Signed, Levell July, NP 03/02/2020 11:23 PM    [facility] at [facility] [place], [place], [place] [address] Phone: [phone]; Fax: [phone]

## 2020-03-02 NOTE — Telephone Encounter (Signed)
Per Hazel Sams, PA-C patient will need lab work prior to being able to send a refill of the [place] to the pharmacy.

## 2020-03-02 NOTE — Telephone Encounter (Signed)
Patient states she was under the impression that she was to restart the medication if she had some at home. Patient states she took one tablet today. Patient advised per Hazel Sams, PA-C she needs to wait to restart medication until after her lab work has been completed.

## 2020-03-03 ENCOUNTER — Encounter: Payer: Self-pay | Admitting: Physician Assistant

## 2020-03-03 ENCOUNTER — Ambulatory Visit: Payer: TRICARE For Life (TFL) | Admitting: Family Medicine

## 2020-03-03 DIAGNOSIS — M48062 Spinal stenosis, lumbar region with neurogenic claudication: Secondary | ICD-10-CM | POA: Diagnosis not present

## 2020-03-03 DIAGNOSIS — R55 Syncope and collapse: Secondary | ICD-10-CM

## 2020-03-03 DIAGNOSIS — M961 Postlaminectomy syndrome, not elsewhere classified: Secondary | ICD-10-CM | POA: Diagnosis not present

## 2020-03-03 DIAGNOSIS — N183 Chronic kidney disease, stage 3 unspecified: Secondary | ICD-10-CM | POA: Diagnosis not present

## 2020-03-03 DIAGNOSIS — Z79899 Other long term (current) drug therapy: Secondary | ICD-10-CM | POA: Diagnosis not present

## 2020-03-03 DIAGNOSIS — I5032 Chronic diastolic (congestive) heart failure: Secondary | ICD-10-CM

## 2020-03-03 DIAGNOSIS — I130 Hypertensive heart and chronic kidney disease with heart failure and stage 1 through stage 4 chronic kidney disease, or unspecified chronic kidney disease: Secondary | ICD-10-CM | POA: Diagnosis not present

## 2020-03-03 DIAGNOSIS — G894 Chronic pain syndrome: Secondary | ICD-10-CM | POA: Diagnosis not present

## 2020-03-03 DIAGNOSIS — I5042 Chronic combined systolic (congestive) and diastolic (congestive) heart failure: Secondary | ICD-10-CM | POA: Diagnosis not present

## 2020-03-03 DIAGNOSIS — I10 Essential (primary) hypertension: Secondary | ICD-10-CM

## 2020-03-03 DIAGNOSIS — M353 Polymyalgia rheumatica: Secondary | ICD-10-CM

## 2020-03-03 NOTE — Progress Notes (Addendum)
Cardiology Office Note  Date: 03/04/2020   ID: Nicole [name], DOB 01-Nov-1945, MRN [id number]  PCP:  Nicole Sites, MD  Cardiologist:  Nicole Lesches, MD Electrophysiologist:  None   Chief Complaint: Follow-up, CHF, near syncope, HTN  History of Present Illness: Nicole [name] is a 74 y.o. female with a history of CHF, near syncope, HTN, polymyalgia rheumatica.  Last seen by Dr. Bronson [name] on 04/17/2018 for evaluation of near syncope and CHF at request of PCP.  She is experiencing bilateral leg edema and exertional dyspnea.  She was taking Lasix 20 mg daily, losartan hydrochlorothiazide, with potassium supplementation.  An echocardiogram was orderedShowing EF 65 to 70%.  Grade 1 DD.  Mild concentric and moderate basal septal hypertrophy.  Patient states he is having a lot of trouble with her back with sciatica-like pain.  She had a recent emergency room visit to [facility] for the same complaints.  She is on Nucynta extended release 150 mg every 12 hours.  We do not have the records from that visit.  She has a pending appointment with neurosurgery.  She denies any recent anginal or exertional symptoms, significant weight gain, shortness of breath, PND orthopnea, or lower extremity edema.  She is in a wheelchair today due to her back issues.  Her son accompanies her.   Past Medical History:  Diagnosis Date  . AC (acromioclavicular) joint bone spurs    lt shoulder  . Anemia   . Arthritis   . Carpal tunnel syndrome, bilateral   . CHF (congestive heart failure) ([place])   . Collagen vascular disease ([place])   . Gastroesophageal reflux   . Headache    recent visit to ER @ [facility] for severe headache  . Hypertension   . Lumbar stenosis    Hx of ESIs by Dr. Nelva Bush  . Polyarthralgia   . Polymyalgia ([place])   . Shingles   . Spinal stenosis     Past Surgical History:  Procedure Laterality Date  . BACK SURGERY  07/05/2018, 07/2018   x2   . CHOLECYSTECTOMY    . COLONOSCOPY  06/11/2012    Procedure: COLONOSCOPY;  Surgeon: Jamesetta So, MD;  [facility];  Service: Gastroenterology;  Laterality: N/A;  . HYSTEROSCOPY WITH D & C N/A 02/25/2014   Procedure: DILATATION AND CURETTAGE /HYSTEROSCOPY;  Surgeon: Florian Buff, MD;  Location: [facility];  Service: Gynecology;  Laterality: N/A;  . POLYPECTOMY N/A 02/25/2014   Procedure: POLYPECTOMY;  Surgeon: Florian Buff, MD;  Location: [facility];  Service: Gynecology;  Laterality: N/A;  . RESECTION DISTAL CLAVICAL Right 03/26/2015   Procedure: OPEN DISTAL CLAVICAL RESECTION ;  Surgeon: Netta Cedars, MD;  Location: [facility];  Service: Orthopedics;  Laterality: Right;    Current Outpatient Medications  Medication Sig Dispense Refill  . acetaminophen (TYLENOL) 325 MG tablet Take 2 tablets (650 mg total) by mouth every 6 (six) hours as needed for mild pain or headache (or Fever >/= 101).    . Ascorbic Acid (VITAMIN C PO) Take by mouth daily.    . cyclobenzaprine (FLEXERIL) 5 MG tablet SMARTSIG:1 Tablet(s) By Mouth Every 12 Hours PRN    . diclofenac (VOLTAREN) 75 MG EC tablet 2 (two) times daily.    . diclofenac sodium (VOLTAREN) 1 % GEL as needed.     . docusate sodium (COLACE) 100 MG capsule Take 100 mg by mouth as needed.     . gabapentin (NEURONTIN) 400 MG capsule 3 (three) times daily.    [name]  lidocaine (LIDODERM) 5 % SMARTSIG:1-2 Patch(s) Topical Every 12 Hours PRN    . Multiple Vitamins-Minerals (MULTIVITAMIN PO) Take by mouth daily.    . mupirocin ointment (BACTROBAN) 2 % Apply 1 application topically 2 (two) times daily. 22 g 0  . NUCYNTA ER 150 MG TB12 SMARTSIG:1 Tablet(s) By Mouth Every 12 Hours    . pantoprazole (PROTONIX) 40 MG tablet Take 40 mg by mouth 2 (two) times daily.     . potassium chloride (MICRO-K) 10 MEQ CR capsule Take 10 mEq by mouth daily.    . predniSONE (DELTASONE) 5 MG tablet Take 2 tablets (10 mg total) by mouth daily with breakfast. 60 tablet 0  . Simethicone (GAS-X PO) Take by mouth as needed.    . torsemide  (DEMADEX) 20 MG tablet Take 40 mg by mouth in the morning, at noon, and at bedtime. Only did 2 pills once a day the last few days.     No current facility-administered medications for this visit.   Allergies:  Oxycontin [oxycodone hcl], Sulfa antibiotics, Tramadol, Penicillins, and Sulfasalazine   Social History: The patient  reports that she has never smoked. She has never used smokeless tobacco. She reports that she does not drink alcohol or use drugs.   Family History: The patient's family history includes Arthritis in her sister; Diabetes in her paternal grandmother; Hypertension in her mother; Pneumonia in her father.   ROS:  Please see the history of present illness. Otherwise, complete review of systems is positive for none.  All other systems are reviewed and negative.   Physical Exam: VS:  BP 122/80   Pulse 81   Ht 5' (1.524 m)   Wt 249 lb 9.6 oz (113.2 kg)   SpO2 93%   BMI 48.75 kg/m , BMI Body mass index is 48.75 kg/m.  Wt Readings from Last 3 Encounters:  03/04/20 249 lb 9.6 oz (113.2 kg)  03/01/20 247 lb 3.2 oz (112.1 kg)  08/18/19 266 lb 6.4 oz (120.8 kg)    General: Patient appears comfortable at rest. Neck: Supple, no elevated JVP or carotid bruits, no thyromegaly. Lungs: Clear to auscultation, nonlabored breathing at rest. Cardiac: Regular rate and rhythm, no S3 or significant systolic murmur, no pericardial rub. Extremities: No pitting edema, distal pulses 2+. Skin: Warm and dry. Musculoskeletal: No kyphosis. Neuropsychiatric: Alert and oriented x3, affect grossly appropriate.  ECG:  An ECG dated 03/04/2020 was personally reviewed today and demonstrated:  Sinus rhythm rate of 81.  No acute ST or T wave abnormalities  Recent Labwork: 10/30/2019: ALT 36; AST 36; BUN 14; Creatinine, Ser 1.40; Hemoglobin 12.7; Platelets 359; Potassium 4.6; Sodium 139  No results found for: CHOL, TRIG, HDL, CHOLHDL, VLDL, LDLCALC, LDLDIRECT  Other Studies Reviewed  Today:  Echocardiogram 04/24/2018 Study Conclusions  - Left ventricle: The cavity size was normal. Systolic function was  vigorous. The estimated ejection fraction was in the range of 65%  to 70%. Wall motion was normal; there were no regional wall  motion abnormalities. Doppler parameters are consistent with  abnormal left ventricular relaxation (grade 1 diastolic  dysfunction). Doppler parameters are consistent with high  ventricular filling pressure. Mild concentric and moderate basal  septal hypertrophy.  - Aortic valve: Moderately calcified annulus. Trileaflet. Valve  area (VTI): 2.08 cm^2. Valve area (Vmax): 2.11 cm^2. Valve area  (Vmean): 2.4 cm^2.  - Mitral valve: Calcified annulus. Mildly thickened leaflets .   Assessment and Plan:  1. Chronic diastolic heart failure (HCC) Denies any significant dyspnea,  PND, orthopnea, or weight gain.  Lungs are CTA all fields.  Today's weight is 249.  Continue torsemide 40 mg in morning, noon and at bedtime.  Continue potassium 10 mEq by mouth daily  2. Essential hypertension Blood pressure today 122/80.  Continue torsemide 40 mg, morning, noon and bedtime  3. Near syncope No recurrence of syncope since her episode after last surgery.  4. PMR (polymyalgia rheumatica) ([place]) Managed by rheumatology.  5.  Murmur Audible systolic murmur 2/6 heard at right upper sternal border.  Repeat echo cardiogram.  Medication Adjustments/Labs and Tests Ordered: Current medicines are reviewed at length with the patient today.  Concerns regarding medicines are outlined above.   Disposition: Follow-up with Dr Domenic Polite or [facility] 1 year.  Signed, Levell July, NP 03/04/2020 3:10 PM    [facility] at [place], [place], [place] [address] Phone: [phone]; Fax: [phone]

## 2020-03-04 ENCOUNTER — Encounter: Payer: Self-pay | Admitting: Family Medicine

## 2020-03-04 ENCOUNTER — Other Ambulatory Visit: Payer: Self-pay

## 2020-03-04 ENCOUNTER — Ambulatory Visit (INDEPENDENT_AMBULATORY_CARE_PROVIDER_SITE_OTHER): Payer: Medicare Other | Admitting: Family Medicine

## 2020-03-04 VITALS — BP 122/80 | HR 81 | Ht 60.0 in | Wt 249.6 lb

## 2020-03-04 DIAGNOSIS — I5032 Chronic diastolic (congestive) heart failure: Secondary | ICD-10-CM | POA: Diagnosis not present

## 2020-03-04 DIAGNOSIS — I10 Essential (primary) hypertension: Secondary | ICD-10-CM

## 2020-03-04 DIAGNOSIS — M353 Polymyalgia rheumatica: Secondary | ICD-10-CM | POA: Diagnosis not present

## 2020-03-04 DIAGNOSIS — R011 Cardiac murmur, unspecified: Secondary | ICD-10-CM

## 2020-03-04 NOTE — Patient Instructions (Addendum)

## 2020-03-05 ENCOUNTER — Telehealth: Payer: Self-pay | Admitting: *Deleted

## 2020-03-05 DIAGNOSIS — I130 Hypertensive heart and chronic kidney disease with heart failure and stage 1 through stage 4 chronic kidney disease, or unspecified chronic kidney disease: Secondary | ICD-10-CM | POA: Diagnosis not present

## 2020-03-05 DIAGNOSIS — M961 Postlaminectomy syndrome, not elsewhere classified: Secondary | ICD-10-CM | POA: Diagnosis not present

## 2020-03-05 DIAGNOSIS — M48062 Spinal stenosis, lumbar region with neurogenic claudication: Secondary | ICD-10-CM | POA: Diagnosis not present

## 2020-03-05 DIAGNOSIS — I5042 Chronic combined systolic (congestive) and diastolic (congestive) heart failure: Secondary | ICD-10-CM | POA: Diagnosis not present

## 2020-03-05 DIAGNOSIS — G894 Chronic pain syndrome: Secondary | ICD-10-CM | POA: Diagnosis not present

## 2020-03-05 DIAGNOSIS — N183 Chronic kidney disease, stage 3 unspecified: Secondary | ICD-10-CM | POA: Diagnosis not present

## 2020-03-05 MED ORDER — LEFLUNOMIDE 10 MG PO TABS: 10.0000 mg | ORAL_TABLET | Freq: Every day | ORAL | 2 refills | Status: DC

## 2020-03-05 NOTE — Addendum Note (Signed)
Addended by: Carole Binning on: 03/05/2020 04:03 PM   Modules accepted: Orders

## 2020-03-05 NOTE — Telephone Encounter (Signed)
Per Dr. Estanislado Pandy okay to refill Arava 10 mg daily.

## 2020-03-05 NOTE — Progress Notes (Signed)
Dr. Bronson Ing is listed as Cardiology pcp in his care teams already.

## 2020-03-05 NOTE — Telephone Encounter (Signed)
Labs received from [facility] Reviewed by Hazel Sams, PA-C Drawn on 03/03/2020  BUN 39 Creatinine 1.51 GFR 39 Alk phos. 149  Arava 10 mg daily and Prednisone 10 daily.

## 2020-03-08 ENCOUNTER — Other Ambulatory Visit: Payer: Self-pay | Admitting: Nephrology

## 2020-03-08 ENCOUNTER — Other Ambulatory Visit (HOSPITAL_COMMUNITY): Payer: Self-pay | Admitting: Nephrology

## 2020-03-08 DIAGNOSIS — I5032 Chronic diastolic (congestive) heart failure: Secondary | ICD-10-CM | POA: Diagnosis not present

## 2020-03-08 DIAGNOSIS — I129 Hypertensive chronic kidney disease with stage 1 through stage 4 chronic kidney disease, or unspecified chronic kidney disease: Secondary | ICD-10-CM | POA: Diagnosis not present

## 2020-03-08 DIAGNOSIS — N1832 Chronic kidney disease, stage 3b: Secondary | ICD-10-CM

## 2020-03-08 DIAGNOSIS — E559 Vitamin D deficiency, unspecified: Secondary | ICD-10-CM | POA: Diagnosis not present

## 2020-03-08 DIAGNOSIS — Z79899 Other long term (current) drug therapy: Secondary | ICD-10-CM | POA: Diagnosis not present

## 2020-03-09 ENCOUNTER — Other Ambulatory Visit: Payer: Self-pay | Admitting: *Deleted

## 2020-03-09 DIAGNOSIS — R262 Difficulty in walking, not elsewhere classified: Secondary | ICD-10-CM | POA: Diagnosis not present

## 2020-03-09 DIAGNOSIS — M48062 Spinal stenosis, lumbar region with neurogenic claudication: Secondary | ICD-10-CM | POA: Diagnosis not present

## 2020-03-09 DIAGNOSIS — M6281 Muscle weakness (generalized): Secondary | ICD-10-CM | POA: Diagnosis not present

## 2020-03-09 DIAGNOSIS — M353 Polymyalgia rheumatica: Secondary | ICD-10-CM | POA: Diagnosis not present

## 2020-03-09 DIAGNOSIS — R011 Cardiac murmur, unspecified: Secondary | ICD-10-CM

## 2020-03-09 DIAGNOSIS — N183 Chronic kidney disease, stage 3 unspecified: Secondary | ICD-10-CM | POA: Diagnosis not present

## 2020-03-09 DIAGNOSIS — G894 Chronic pain syndrome: Secondary | ICD-10-CM | POA: Diagnosis not present

## 2020-03-09 DIAGNOSIS — M961 Postlaminectomy syndrome, not elsewhere classified: Secondary | ICD-10-CM | POA: Diagnosis not present

## 2020-03-09 DIAGNOSIS — I5042 Chronic combined systolic (congestive) and diastolic (congestive) heart failure: Secondary | ICD-10-CM | POA: Diagnosis not present

## 2020-03-09 DIAGNOSIS — Z6841 Body Mass Index (BMI) 40.0 and over, adult: Secondary | ICD-10-CM | POA: Diagnosis not present

## 2020-03-09 DIAGNOSIS — I130 Hypertensive heart and chronic kidney disease with heart failure and stage 1 through stage 4 chronic kidney disease, or unspecified chronic kidney disease: Secondary | ICD-10-CM | POA: Diagnosis not present

## 2020-03-11 DIAGNOSIS — I130 Hypertensive heart and chronic kidney disease with heart failure and stage 1 through stage 4 chronic kidney disease, or unspecified chronic kidney disease: Secondary | ICD-10-CM | POA: Diagnosis not present

## 2020-03-11 DIAGNOSIS — M48062 Spinal stenosis, lumbar region with neurogenic claudication: Secondary | ICD-10-CM | POA: Diagnosis not present

## 2020-03-11 DIAGNOSIS — N183 Chronic kidney disease, stage 3 unspecified: Secondary | ICD-10-CM | POA: Diagnosis not present

## 2020-03-11 DIAGNOSIS — G894 Chronic pain syndrome: Secondary | ICD-10-CM | POA: Diagnosis not present

## 2020-03-11 DIAGNOSIS — M961 Postlaminectomy syndrome, not elsewhere classified: Secondary | ICD-10-CM | POA: Diagnosis not present

## 2020-03-11 DIAGNOSIS — I5042 Chronic combined systolic (congestive) and diastolic (congestive) heart failure: Secondary | ICD-10-CM | POA: Diagnosis not present

## 2020-03-15 ENCOUNTER — Ambulatory Visit (HOSPITAL_COMMUNITY)
Admission: RE | Admit: 2020-03-15 | Discharge: 2020-03-15 | Disposition: A | Discharge status: Home / Self Care | Payer: Medicare Other | Source: Ambulatory Visit | Attending: Nephrology | Admitting: Nephrology

## 2020-03-15 ENCOUNTER — Other Ambulatory Visit: Payer: Self-pay

## 2020-03-15 DIAGNOSIS — I5032 Chronic diastolic (congestive) heart failure: Secondary | ICD-10-CM | POA: Insufficient documentation

## 2020-03-15 DIAGNOSIS — I129 Hypertensive chronic kidney disease with stage 1 through stage 4 chronic kidney disease, or unspecified chronic kidney disease: Secondary | ICD-10-CM | POA: Diagnosis not present

## 2020-03-15 DIAGNOSIS — N1832 Chronic kidney disease, stage 3b: Secondary | ICD-10-CM

## 2020-03-16 ENCOUNTER — Telehealth: Payer: Self-pay | Admitting: Rheumatology

## 2020-03-16 ENCOUNTER — Other Ambulatory Visit (HOSPITAL_COMMUNITY): Payer: Self-pay | Admitting: Nurse Practitioner

## 2020-03-16 ENCOUNTER — Ambulatory Visit (HOSPITAL_COMMUNITY)
Admission: RE | Admit: 2020-03-16 | Discharge: 2020-03-16 | Disposition: A | Discharge status: Home / Self Care | Payer: Medicare Other | Source: Ambulatory Visit | Attending: Family Medicine | Admitting: Family Medicine

## 2020-03-16 DIAGNOSIS — G894 Chronic pain syndrome: Secondary | ICD-10-CM | POA: Diagnosis not present

## 2020-03-16 DIAGNOSIS — M5135 Other intervertebral disc degeneration, thoracolumbar region: Secondary | ICD-10-CM

## 2020-03-16 DIAGNOSIS — I5042 Chronic combined systolic (congestive) and diastolic (congestive) heart failure: Secondary | ICD-10-CM | POA: Diagnosis not present

## 2020-03-16 DIAGNOSIS — I130 Hypertensive heart and chronic kidney disease with heart failure and stage 1 through stage 4 chronic kidney disease, or unspecified chronic kidney disease: Secondary | ICD-10-CM | POA: Diagnosis not present

## 2020-03-16 DIAGNOSIS — M48062 Spinal stenosis, lumbar region with neurogenic claudication: Secondary | ICD-10-CM | POA: Diagnosis not present

## 2020-03-16 DIAGNOSIS — M961 Postlaminectomy syndrome, not elsewhere classified: Secondary | ICD-10-CM | POA: Diagnosis not present

## 2020-03-16 DIAGNOSIS — N183 Chronic kidney disease, stage 3 unspecified: Secondary | ICD-10-CM | POA: Diagnosis not present

## 2020-03-16 NOTE — Telephone Encounter (Signed)
Patient called stating [facility] ordered labwork today and it was sent to [facility].  Patient states Dr. Theador Hawthorne also ordered labwork which she will have on Thursday and a 24 hour Urine Test which will start tomorrow.  Patient states she will have the results sent to Dr. Estanislado Pandy.

## 2020-03-16 NOTE — Progress Notes (Signed)
Office Visit Note  Patient: Nicole [name]             Date of Birth: [birth year]           MRN: [id number]             PCP: Sharilyn Sites, MD Referring: Sharilyn Sites, MD Visit Date: 03/30/2020 Occupation: @GUAROCC @  Subjective:  Medication monitoring   History of Present Illness: Nicole [name] is a 74 y.o. female with history of PMR and osteoarthritis.  She is taking Arava 10 mg 1 tablet by mouth daily and prednisone 10 mg by mouth daily.  She was restarted on [place] after her last office visit on 03/01/2020.  She is tolerating Arava without any side effects.  She had a recent flare of her lower back discomfort.  She is having right sided radiculopathy.  She has been experiencing muscle spasms in her lower back. She has been unable to be as mobile as she was due to the discomfort.  She was evaluated by her neurosurgeon once a week. She has ongoing generalized joint stiffness.  She denies any joint swelling.  Her pedal edema has improved.  She states she established with a nephrologist at [facility] nephrology, Dr. Theador Hawthorne.   Activities of Daily Living:  Patient reports morning stiffness for 2-3  hours.   Patient Denies nocturnal pain.  Difficulty dressing/grooming: Denies Difficulty climbing stairs: Reports Difficulty getting out of chair: Reports Difficulty using hands for taps, buttons, cutlery, and/or writing: Denies  Review of Systems  Constitutional: Positive for fatigue.  HENT: Negative for mouth sores, mouth dryness and nose dryness.   Eyes: Negative for pain, visual disturbance and dryness.  Respiratory: Negative for cough, hemoptysis, shortness of breath and difficulty breathing.   Cardiovascular: Negative for chest pain, palpitations, hypertension and swelling in legs/feet.  Gastrointestinal: Negative for blood in stool, constipation and diarrhea.  Endocrine: Negative for increased urination.  Genitourinary: Negative for painful urination.  Musculoskeletal: Positive for  arthralgias, joint pain, myalgias, morning stiffness, muscle tenderness and myalgias. Negative for joint swelling and muscle weakness.  Skin: Negative for color change, pallor, rash, hair loss, nodules/bumps, skin tightness, ulcers and sensitivity to sunlight.  Allergic/Immunologic: Negative for susceptible to infections.  Neurological: Negative for dizziness, numbness, headaches and weakness.  Hematological: Negative for swollen glands.  Psychiatric/Behavioral: Negative for depressed mood and sleep disturbance. The patient is not nervous/anxious.     PMFS History:  Patient Active Problem List   Diagnosis Date Noted  . Diverticulitis 05/02/2018  . Diverticulitis of colon 05/01/2018  . Morbid obesity with BMI of 50.0-59.9, adult ([place]) 05/01/2018  . Chronic diastolic HF (heart failure) ([place]) 05/01/2018  . On prednisone therapy 05/31/2017  . Positive anti-CCP test 05/31/2017  . Rheumatoid factor positive 05/31/2017  . Memory loss 04/04/2017  . Polymyalgia rheumatica ([place]) 04/04/2017  . High risk medication use 12/07/2016  . DDD (degenerative disc disease), lumbar 09/27/2016  . HTN (hypertension) 06/05/2016  . Chronic pain syndrome 06/05/2016  . Acute diverticulitis 06/02/2016  . Diverticulitis large intestine 06/02/2016  . Spinal stenosis of lumbosacral region 02/29/2016  . Joint pain 02/29/2016  . Endometrial polyp 02/06/2014  . Postmenopausal bleeding 01/30/2014  . GERD (gastroesophageal reflux disease) 09/29/2013  . Spinal stenosis, thoracic 09/29/2013  . Shingles 09/29/2013  . Thoracic or lumbosacral neuritis or radiculitis, unspecified 05/04/2011  . Abnormality of gait 05/04/2011  . Muscle weakness (generalized) 05/04/2011  . Bilateral primary osteoarthritis of knee 04/07/2009  . KNEE PAIN 04/07/2009    Past  Medical History:  Diagnosis Date  . AC (acromioclavicular) joint bone spurs    lt shoulder  . Anemia   . Arthritis   . Carpal tunnel syndrome, bilateral   . CHF  (congestive heart failure) ([place])   . Collagen vascular disease ([place])   . Gastroesophageal reflux   . Headache    recent visit to ER @ [facility] for severe headache  . Hypertension   . Lumbar stenosis    Hx of ESIs by Dr. Nelva Bush  . Polyarthralgia   . Polymyalgia ([place])   . Shingles   . Spinal stenosis     Family History  Problem Relation Age of Onset  . Hypertension Mother   . Pneumonia Father   . Arthritis Sister   . Diabetes Paternal Grandmother    Past Surgical History:  Procedure Laterality Date  . BACK SURGERY  07/05/2018, 07/2018   x2   . CHOLECYSTECTOMY    . COLONOSCOPY  06/11/2012   Procedure: COLONOSCOPY;  Surgeon: Jamesetta So, MD;  Location: [facility];  Service: Gastroenterology;  Laterality: N/A;  . HYSTEROSCOPY WITH D & C N/A 02/25/2014   Procedure: DILATATION AND CURETTAGE /HYSTEROSCOPY;  Surgeon: Florian Buff, MD;  Location: [facility] ORS;  Service: Gynecology;  Laterality: N/A;  . POLYPECTOMY N/A 02/25/2014   Procedure: POLYPECTOMY;  Surgeon: Florian Buff, MD;  Location: [facility] OR[facility];  Service: Gynecology;  Laterality: N/A;  . RESECTION DISTAL CLAVICAL Right 03/26/2015   Procedure: OPEN DISTAL CLAVICAL RESECTION ;  Surgeon: Netta Cedars, MD;  Location: [place];  Service: Orthopedics;  Laterality: Right;   Social History   Social History Narrative   Lives at home alone.   Right-handed.   1-2 cups caffeine daily.   Immunization History  Administered Date(s) Administered  . Influenza,inj,quad, With Preservative 07/23/2017     Objective: Vital Signs: BP 128/78 (BP Location: Left Arm, Patient Position: Sitting, Cuff Size: Large)   Pulse 93   Resp 13   Ht 5' (1.524 m)   Wt 241 lb (109.3 kg)   BMI 47.07 kg/m    Physical Exam Vitals and nursing note reviewed.  Constitutional:      Appearance: She is well-developed.  HENT:     Head: Normocephalic and atraumatic.  Eyes:     Conjunctiva/sclera: Conjunctivae normal.  Pulmonary:     Effort: Pulmonary effort is  normal.  Abdominal:     General: Bowel sounds are normal.     Palpations: Abdomen is soft.  Musculoskeletal:     Cervical back: Normal range of motion.  Lymphadenopathy:     Cervical: No cervical adenopathy.  Skin:    General: Skin is warm and dry.     Capillary Refill: Capillary refill takes less than 2 seconds.  Neurological:     Mental Status: She is alert and oriented to person, place, and time.  Psychiatric:        Behavior: Behavior normal.      Musculoskeletal Exam:  She is primarily wheelchair bound. C-spine good ROM with no discomfort. Painful and limited ROM of lumbar spine.Shoulder joints limited ROM bilaterally. Elbow joints good ROM with no tenderness or inflammation. No tenderness or inflammation of MCP joints. She has PIP and DIP thickening consistent with osteoarthritis of both hands.  Both knee joints have full flexion and extension.  No tenderness or inflammation ankle joints at this time.   CDAI Exam: CDAI Score: -- Patient Global: --; Provider Global: -- Swollen: --; Tender: -- Joint Exam  03/30/2020   No joint exam has been documented for this visit   There is currently no information documented on the homunculus. Go to the Rheumatology activity and complete the homunculus joint exam.  Investigation: No additional findings.  Imaging: MR THORACIC SPINE WO CONTRAST  Result Date: 03/16/2020 CLINICAL DATA:  Upper and lower back pain for 2 months EXAM: MRI THORACIC AND LUMBAR SPINE WITHOUT CONTRAST TECHNIQUE: Multiplanar and multiecho pulse sequences of the thoracic and lumbar spine were obtained without intravenous contrast. COMPARISON:  Lumbar spine MRI 09/29/17 FINDINGS: MRI THORACIC SPINE FINDINGS Alignment:  Physiologic. Vertebrae: No fracture, evidence of discitis, or bone lesion. Cord:  Normal signal and morphology. Paraspinal and other soft tissues: Negative. Disc levels: T1-2: Small disc bulge without stenosis. T2-3: Small central protrusion without  stenosis. T3-4: Intermediate sized central protrusion contacting the ventral spinal cord. No stenosis. T4-5: Small central protrusion without stenosis. T5-6: Unremarkable. T6-7: Small bulge with mild spinal canal stenosis and moderate bilateral foraminal narrowing. T7-8: Small disc bulge without stenosis. T8-9: Small disc bulge with mild right foraminal narrowing. T9-10: Unremarkable. T10-11: Unremarkable. T11-12: Facet hypertrophy and small disc bulge with mild spinal canal stenosis. T12-L1: Small disc bulge without spinal canal stenosis. Moderate bilateral foraminal stenosis. MRI LUMBAR SPINE FINDINGS Segmentation:  Standard Alignment:  Grade 1 retrolisthesis at T12-L1, L1-2 at L2-3. Vertebrae: Multilevel vertebral body height loss. No acute abnormality. Posterior decompression at all lumbar levels. Conus medullaris and cauda equina: Conus extends to the L1 level. Conus and cauda equina appear normal. Paraspinal and other soft tissues: Negative. Disc levels: L1-2: Intermediate disc bulge. Posterior decompression without spinal canal stenosis. Unchanged severe bilateral foraminal stenosis. L2-3: Intermediate disc bulge and posterior decompression. No residual spinal canal stenosis. Unchanged severe bilateral neural foraminal stenosis. L3-4: Disc bulge and posterior decompression. No residual spinal canal stenosis. Severe facet arthrosis with unchanged severe bilateral neural foraminal stenosis. L4-5: Disc bulge and posterior decompression with severe facet arthrosis. No residual spinal canal stenosis. Severe bilateral neural foraminal stenosis. L5-S1: Disc bulge and posterior decompression. No residual spinal canal stenosis. Unchanged severe bilateral neural foraminal stenosis. Visualized sacrum: Normal. IMPRESSION: 1. Multilevel thoracic degenerative disc disease without high grade spinal canal stenosis. 2. Mild-to-moderate neural foraminal stenosis at multiple thoracic levels. 3. Long segment lumbar posterior  decompression without residual spinal canal stenosis. 4. Severe neural foraminal stenosis at all lumbar levels, unchanged. Electronically Signed   By: Ulyses Jarred M.D.   On: 03/16/2020 20:17   MR LUMBAR SPINE WO CONTRAST  Result Date: 03/16/2020 CLINICAL DATA:  Upper and lower back pain for 2 months EXAM: MRI THORACIC AND LUMBAR SPINE WITHOUT CONTRAST TECHNIQUE: Multiplanar and multiecho pulse sequences of the thoracic and lumbar spine were obtained without intravenous contrast. COMPARISON:  Lumbar spine MRI 09/29/17 FINDINGS: MRI THORACIC SPINE FINDINGS Alignment:  Physiologic. Vertebrae: No fracture, evidence of discitis, or bone lesion. Cord:  Normal signal and morphology. Paraspinal and other soft tissues: Negative. Disc levels: T1-2: Small disc bulge without stenosis. T2-3: Small central protrusion without stenosis. T3-4: Intermediate sized central protrusion contacting the ventral spinal cord. No stenosis. T4-5: Small central protrusion without stenosis. T5-6: Unremarkable. T6-7: Small bulge with mild spinal canal stenosis and moderate bilateral foraminal narrowing. T7-8: Small disc bulge without stenosis. T8-9: Small disc bulge with mild right foraminal narrowing. T9-10: Unremarkable. T10-11: Unremarkable. T11-12: Facet hypertrophy and small disc bulge with mild spinal canal stenosis. T12-L1: Small disc bulge without spinal canal stenosis. Moderate bilateral foraminal stenosis. MRI LUMBAR SPINE FINDINGS Segmentation:  Standard  Alignment:  Grade 1 retrolisthesis at T12-L1, L1-2 at L2-3. Vertebrae: Multilevel vertebral body height loss. No acute abnormality. Posterior decompression at all lumbar levels. Conus medullaris and cauda equina: Conus extends to the L1 level. Conus and cauda equina appear normal. Paraspinal and other soft tissues: Negative. Disc levels: L1-2: Intermediate disc bulge. Posterior decompression without spinal canal stenosis. Unchanged severe bilateral foraminal stenosis. L2-3:  Intermediate disc bulge and posterior decompression. No residual spinal canal stenosis. Unchanged severe bilateral neural foraminal stenosis. L3-4: Disc bulge and posterior decompression. No residual spinal canal stenosis. Severe facet arthrosis with unchanged severe bilateral neural foraminal stenosis. L4-5: Disc bulge and posterior decompression with severe facet arthrosis. No residual spinal canal stenosis. Severe bilateral neural foraminal stenosis. L5-S1: Disc bulge and posterior decompression. No residual spinal canal stenosis. Unchanged severe bilateral neural foraminal stenosis. Visualized sacrum: Normal. IMPRESSION: 1. Multilevel thoracic degenerative disc disease without high grade spinal canal stenosis. 2. Mild-to-moderate neural foraminal stenosis at multiple thoracic levels. 3. Long segment lumbar posterior decompression without residual spinal canal stenosis. 4. Severe neural foraminal stenosis at all lumbar levels, unchanged. Electronically Signed   By: Ulyses Jarred M.D.   On: 03/16/2020 20:17   US RENAL  Result Date: 03/15/2020 CLINICAL DATA:  Stage IIIB chronic kidney disease. Back pain for 1 month. EXAM: RENAL / URINARY TRACT ULTRASOUND COMPLETE COMPARISON:  05/01/2018 CT abdomen/pelvis. FINDINGS: Right Kidney: Renal measurements: 9.6 x 4.5 x 5.4 cm = volume: 120 mL. Mildly echogenic right renal parenchyma, thickness within normal limits. No hydronephrosis. No renal mass. Left Kidney: Renal measurements: 9.2 x 5.8 x 6.1 cm = volume: 176 mL. Mildly echogenic left renal parenchyma, thickness within normal limits. No hydronephrosis. No renal mass. Bladder: Appears normal for degree of bladder distention. Left ureteral jet demonstrated in the bladder. Right ureteral jet not visualized. Other: Diffusely echogenic right liver parenchyma. IMPRESSION: 1. No hydronephrosis. 2. Mildly echogenic kidneys, compatible with reported history of nonspecific chronic renal parenchymal disease. 3. Normal bladder.  4. Diffusely echogenic right liver parenchyma, nonspecific, differential includes hepatic steatosis and/or fibrosis. Consider hepatic elastography for further liver fibrosis risk stratification, as clinically warranted. Electronically Signed   By: Ilona Sorrel M.D.   On: 03/15/2020 16:43    Recent Labs: Lab Results  Component Value Date   WBC 11.3 (H) 10/30/2019   HGB 12.7 10/30/2019   PLT 359 10/30/2019   NA 139 10/30/2019   K 4.6 10/30/2019   CL 99 10/30/2019   CO2 24 10/30/2019   GLUCOSE 94 10/30/2019   BUN 14 10/30/2019   CREATININE 1.40 (H) 10/30/2019   BILITOT 0.3 10/30/2019   ALKPHOS 158 (H) 10/30/2019   AST 36 10/30/2019   ALT 36 (H) 10/30/2019   PROT 7.1 10/30/2019   ALBUMIN 4.1 10/30/2019   CALCIUM 9.6 10/30/2019   GFRAA 43 (L) 10/30/2019    Speciality Comments: PLQ Eye Exam: 12/13/17 WNl @ [facility]  Procedures:  No procedures performed Allergies: Oxycontin [oxycodone hcl], Sulfa antibiotics, Tramadol, Penicillins, and Sulfasalazine   Assessment / Plan:     Visit Diagnoses: Temporal arteritis ([place]): She has not experienced any temporal artery tenderness recently.  She has been experiencing intermittent headaches.  She will continue taking Arava 10 mg 1 tablet daily and prednisone 10 mg by mouth daily.  She was advised to notify us if she develops temporal artery tenderness.  She will follow up in 2 months.   Polymyalgia rheumatica ([employer]): She has not had any signs or symptoms of a PMR  flare recently.  She is clinically doing well on prednisone 10 mg by mouth daily.  She was restarted on Arava 10 mg 1 tablet by mouth daily after her last office visit on 03/01/2020.  She has been tolerating Arava without any side effects.  She has no signs of inflammatory arthritis at this time.  No synovitis was noted.  She continues to have weakness in upper and lower extremities due to muscle deconditioning.  She is primarily wheelchair-bound.  She has been working with physical  therapy at home and trying to use a Rollator walker to improve her mobility.  She has been experiencing increased lower back pain status post laminectomy in September 2019.  Over the past several weeks has had more discomfort and was evaluated by Dr. Vertell Limber.  She will be scheduling an appointment with pain management to discuss repeat injections.  She will continue taking Arava and prednisone as prescribed.  We will reassess how she is doing in 2 months.  High risk medication use - Arava 10 mg 1 tablet by mouth daily and prednisone 10 mg daily.  She had recent lab work on 03/16/2020 which was stable.  On prednisone therapy: She will continue taking prednisone 5 mg 2 tablets daily.  Rheumatoid factor positive: She has no synovitis on exam.  Positive anti-CCP test: She has no synovitis on exam.  Chronic pain syndrome: She continues to take Nucynta and gabapentin as prescribed.  Primary osteoarthritis of both knees: She has good range of motion of both knee joints on exam.  No warmth or effusion was noted.  She is primarily wheelchair-bound.  She has been working with physical therapy and using a Rollator walker to try to improve her mobility.  DDD (degenerative disc disease), lumbar: Chronic pain.  She has been experiencing increased lower back and right lower extremity pain for the past 2 months.  She was evaluated by Dr. Vertell Limber on 03/24/2020 and had updated x-rays of the lumbar spine.  she previously underwent open lumbar laminectomy of L5-S1 in September 2019 and a revision in October 2019 due to a wound infection.  She has very limited mobility and is primarily wheelchair-bound.  She has been working physical therapy at home but has been more sedentary over the past several weeks due to the increased lower back pain.  She uses lidocaine patches and takes Nucynta, cyclobenzaprine and gabapentin as prescribed.  According to the patient she will be referred to pain management to discuss receiving  injections.  Spinal stenosis, thoracic: Chronic pain.   Other medical conditions are listed as follows:   Vitamin D deficiency  History of diverticulitis  History of hypertension  History of gastroesophageal reflux (GERD)  Orders: No orders of the defined types were placed in this encounter.  No orders of the defined types were placed in this encounter.     Follow-Up Instructions: Return in about 2 months (around 05/30/2020) for Polymyalgia Rheumatica, Osteoarthritis.   Ofilia Neas, PA-C  Note - This record has been created using Dragon software.  Chart creation errors have been sought, but may not always  have been located. Such creation errors do not reflect on  the standard of medical care.

## 2020-03-17 DIAGNOSIS — M961 Postlaminectomy syndrome, not elsewhere classified: Secondary | ICD-10-CM | POA: Diagnosis not present

## 2020-03-17 DIAGNOSIS — N183 Chronic kidney disease, stage 3 unspecified: Secondary | ICD-10-CM | POA: Diagnosis not present

## 2020-03-17 DIAGNOSIS — I130 Hypertensive heart and chronic kidney disease with heart failure and stage 1 through stage 4 chronic kidney disease, or unspecified chronic kidney disease: Secondary | ICD-10-CM | POA: Diagnosis not present

## 2020-03-17 DIAGNOSIS — M48062 Spinal stenosis, lumbar region with neurogenic claudication: Secondary | ICD-10-CM | POA: Diagnosis not present

## 2020-03-17 DIAGNOSIS — G894 Chronic pain syndrome: Secondary | ICD-10-CM | POA: Diagnosis not present

## 2020-03-17 DIAGNOSIS — I5042 Chronic combined systolic (congestive) and diastolic (congestive) heart failure: Secondary | ICD-10-CM | POA: Diagnosis not present

## 2020-03-18 ENCOUNTER — Telehealth: Payer: Self-pay | Admitting: *Deleted

## 2020-03-18 DIAGNOSIS — N1832 Chronic kidney disease, stage 3b: Secondary | ICD-10-CM | POA: Diagnosis not present

## 2020-03-18 DIAGNOSIS — I129 Hypertensive chronic kidney disease with stage 1 through stage 4 chronic kidney disease, or unspecified chronic kidney disease: Secondary | ICD-10-CM | POA: Diagnosis not present

## 2020-03-18 DIAGNOSIS — Z79899 Other long term (current) drug therapy: Secondary | ICD-10-CM | POA: Diagnosis not present

## 2020-03-18 DIAGNOSIS — I5032 Chronic diastolic (congestive) heart failure: Secondary | ICD-10-CM | POA: Diagnosis not present

## 2020-03-18 DIAGNOSIS — E559 Vitamin D deficiency, unspecified: Secondary | ICD-10-CM | POA: Diagnosis not present

## 2020-03-18 NOTE — Telephone Encounter (Signed)
Results received from [facility] on 03/16/2020 Reviewed by Hazel Sams, PA-C  CBC/CMP  WBC 12.1 Neutrophils (Absolute) 9.1  BUN 51 Creatinine 1.53 GFR 39 Chloride 94 Alk Phos 155  Patient on Prednisone 10 mg daily and Arava 10 mg daily.

## 2020-03-19 DIAGNOSIS — M961 Postlaminectomy syndrome, not elsewhere classified: Secondary | ICD-10-CM | POA: Diagnosis not present

## 2020-03-19 DIAGNOSIS — M48062 Spinal stenosis, lumbar region with neurogenic claudication: Secondary | ICD-10-CM | POA: Diagnosis not present

## 2020-03-19 DIAGNOSIS — G894 Chronic pain syndrome: Secondary | ICD-10-CM | POA: Diagnosis not present

## 2020-03-19 DIAGNOSIS — I130 Hypertensive heart and chronic kidney disease with heart failure and stage 1 through stage 4 chronic kidney disease, or unspecified chronic kidney disease: Secondary | ICD-10-CM | POA: Diagnosis not present

## 2020-03-19 DIAGNOSIS — N183 Chronic kidney disease, stage 3 unspecified: Secondary | ICD-10-CM | POA: Diagnosis not present

## 2020-03-19 DIAGNOSIS — I5042 Chronic combined systolic (congestive) and diastolic (congestive) heart failure: Secondary | ICD-10-CM | POA: Diagnosis not present

## 2020-03-22 ENCOUNTER — Other Ambulatory Visit: Payer: Self-pay | Admitting: Rheumatology

## 2020-03-23 DIAGNOSIS — N183 Chronic kidney disease, stage 3 unspecified: Secondary | ICD-10-CM | POA: Diagnosis not present

## 2020-03-23 DIAGNOSIS — M48062 Spinal stenosis, lumbar region with neurogenic claudication: Secondary | ICD-10-CM | POA: Diagnosis not present

## 2020-03-23 DIAGNOSIS — I130 Hypertensive heart and chronic kidney disease with heart failure and stage 1 through stage 4 chronic kidney disease, or unspecified chronic kidney disease: Secondary | ICD-10-CM | POA: Diagnosis not present

## 2020-03-23 DIAGNOSIS — M961 Postlaminectomy syndrome, not elsewhere classified: Secondary | ICD-10-CM | POA: Diagnosis not present

## 2020-03-23 DIAGNOSIS — G894 Chronic pain syndrome: Secondary | ICD-10-CM | POA: Diagnosis not present

## 2020-03-23 DIAGNOSIS — I5042 Chronic combined systolic (congestive) and diastolic (congestive) heart failure: Secondary | ICD-10-CM | POA: Diagnosis not present

## 2020-03-23 NOTE — Telephone Encounter (Signed)
Last Visit: 03/01/2020 Next Visit: 03/30/2020  Current Dose per office note on 03/01/2020: prednisone 10 mg daily  Okay to refill per Dr. Estanislado Pandy

## 2020-03-24 DIAGNOSIS — M8588 Other specified disorders of bone density and structure, other site: Secondary | ICD-10-CM | POA: Diagnosis not present

## 2020-03-24 DIAGNOSIS — G9589 Other specified diseases of spinal cord: Secondary | ICD-10-CM | POA: Diagnosis not present

## 2020-03-24 DIAGNOSIS — M5136 Other intervertebral disc degeneration, lumbar region: Secondary | ICD-10-CM | POA: Diagnosis not present

## 2020-03-24 DIAGNOSIS — Z9889 Other specified postprocedural states: Secondary | ICD-10-CM | POA: Diagnosis not present

## 2020-03-24 DIAGNOSIS — M4186 Other forms of scoliosis, lumbar region: Secondary | ICD-10-CM | POA: Diagnosis not present

## 2020-03-24 DIAGNOSIS — M419 Scoliosis, unspecified: Secondary | ICD-10-CM | POA: Diagnosis not present

## 2020-03-24 DIAGNOSIS — Z6841 Body Mass Index (BMI) 40.0 and over, adult: Secondary | ICD-10-CM | POA: Diagnosis not present

## 2020-03-24 DIAGNOSIS — M4185 Other forms of scoliosis, thoracolumbar region: Secondary | ICD-10-CM | POA: Diagnosis not present

## 2020-03-24 DIAGNOSIS — M5416 Radiculopathy, lumbar region: Secondary | ICD-10-CM | POA: Diagnosis not present

## 2020-03-25 DIAGNOSIS — M48062 Spinal stenosis, lumbar region with neurogenic claudication: Secondary | ICD-10-CM | POA: Diagnosis not present

## 2020-03-25 DIAGNOSIS — I130 Hypertensive heart and chronic kidney disease with heart failure and stage 1 through stage 4 chronic kidney disease, or unspecified chronic kidney disease: Secondary | ICD-10-CM | POA: Diagnosis not present

## 2020-03-25 DIAGNOSIS — I5042 Chronic combined systolic (congestive) and diastolic (congestive) heart failure: Secondary | ICD-10-CM | POA: Diagnosis not present

## 2020-03-25 DIAGNOSIS — N183 Chronic kidney disease, stage 3 unspecified: Secondary | ICD-10-CM | POA: Diagnosis not present

## 2020-03-25 DIAGNOSIS — M961 Postlaminectomy syndrome, not elsewhere classified: Secondary | ICD-10-CM | POA: Diagnosis not present

## 2020-03-25 DIAGNOSIS — G894 Chronic pain syndrome: Secondary | ICD-10-CM | POA: Diagnosis not present

## 2020-03-26 DIAGNOSIS — N183 Chronic kidney disease, stage 3 unspecified: Secondary | ICD-10-CM | POA: Diagnosis not present

## 2020-03-26 DIAGNOSIS — M961 Postlaminectomy syndrome, not elsewhere classified: Secondary | ICD-10-CM | POA: Diagnosis not present

## 2020-03-26 DIAGNOSIS — I5042 Chronic combined systolic (congestive) and diastolic (congestive) heart failure: Secondary | ICD-10-CM | POA: Diagnosis not present

## 2020-03-26 DIAGNOSIS — G894 Chronic pain syndrome: Secondary | ICD-10-CM | POA: Diagnosis not present

## 2020-03-26 DIAGNOSIS — M48062 Spinal stenosis, lumbar region with neurogenic claudication: Secondary | ICD-10-CM | POA: Diagnosis not present

## 2020-03-26 DIAGNOSIS — I130 Hypertensive heart and chronic kidney disease with heart failure and stage 1 through stage 4 chronic kidney disease, or unspecified chronic kidney disease: Secondary | ICD-10-CM | POA: Diagnosis not present

## 2020-03-29 ENCOUNTER — Ambulatory Visit: Payer: TRICARE For Life (TFL) | Admitting: Physician Assistant

## 2020-03-30 ENCOUNTER — Other Ambulatory Visit: Payer: Self-pay

## 2020-03-30 ENCOUNTER — Ambulatory Visit (INDEPENDENT_AMBULATORY_CARE_PROVIDER_SITE_OTHER): Payer: Medicare Other | Admitting: Physician Assistant

## 2020-03-30 ENCOUNTER — Encounter: Payer: Self-pay | Admitting: Physician Assistant

## 2020-03-30 VITALS — BP 128/78 | HR 93 | Resp 13 | Ht 60.0 in | Wt 241.0 lb

## 2020-03-30 DIAGNOSIS — Z8679 Personal history of other diseases of the circulatory system: Secondary | ICD-10-CM

## 2020-03-30 DIAGNOSIS — M316 Other giant cell arteritis: Secondary | ICD-10-CM

## 2020-03-30 DIAGNOSIS — Z8719 Personal history of other diseases of the digestive system: Secondary | ICD-10-CM

## 2020-03-30 DIAGNOSIS — M4804 Spinal stenosis, thoracic region: Secondary | ICD-10-CM

## 2020-03-30 DIAGNOSIS — G894 Chronic pain syndrome: Secondary | ICD-10-CM | POA: Diagnosis not present

## 2020-03-30 DIAGNOSIS — M353 Polymyalgia rheumatica: Secondary | ICD-10-CM

## 2020-03-30 DIAGNOSIS — Z79899 Other long term (current) drug therapy: Secondary | ICD-10-CM | POA: Diagnosis not present

## 2020-03-30 DIAGNOSIS — M5136 Other intervertebral disc degeneration, lumbar region: Secondary | ICD-10-CM | POA: Diagnosis not present

## 2020-03-30 DIAGNOSIS — E559 Vitamin D deficiency, unspecified: Secondary | ICD-10-CM | POA: Diagnosis not present

## 2020-03-30 DIAGNOSIS — R768 Other specified abnormal immunological findings in serum: Secondary | ICD-10-CM

## 2020-03-30 DIAGNOSIS — M51369 Other intervertebral disc degeneration, lumbar region without mention of lumbar back pain or lower extremity pain: Secondary | ICD-10-CM

## 2020-03-30 DIAGNOSIS — M170 Bilateral primary osteoarthritis of knee: Secondary | ICD-10-CM

## 2020-03-30 DIAGNOSIS — Z7952 Long term (current) use of systemic steroids: Secondary | ICD-10-CM

## 2020-03-30 NOTE — Patient Instructions (Signed)
Continue taking prednisone 5 mg 2 tablets by mouth daily (10 mg total)  Continue taking Arava 10 mg 1 tablet by mouth daily.

## 2020-04-01 DIAGNOSIS — M961 Postlaminectomy syndrome, not elsewhere classified: Secondary | ICD-10-CM | POA: Diagnosis not present

## 2020-04-01 DIAGNOSIS — I130 Hypertensive heart and chronic kidney disease with heart failure and stage 1 through stage 4 chronic kidney disease, or unspecified chronic kidney disease: Secondary | ICD-10-CM | POA: Diagnosis not present

## 2020-04-01 DIAGNOSIS — N183 Chronic kidney disease, stage 3 unspecified: Secondary | ICD-10-CM | POA: Diagnosis not present

## 2020-04-01 DIAGNOSIS — M48062 Spinal stenosis, lumbar region with neurogenic claudication: Secondary | ICD-10-CM | POA: Diagnosis not present

## 2020-04-01 DIAGNOSIS — I5042 Chronic combined systolic (congestive) and diastolic (congestive) heart failure: Secondary | ICD-10-CM | POA: Diagnosis not present

## 2020-04-01 DIAGNOSIS — G894 Chronic pain syndrome: Secondary | ICD-10-CM | POA: Diagnosis not present

## 2020-04-02 DIAGNOSIS — M069 Rheumatoid arthritis, unspecified: Secondary | ICD-10-CM | POA: Diagnosis not present

## 2020-04-02 DIAGNOSIS — I10 Essential (primary) hypertension: Secondary | ICD-10-CM | POA: Diagnosis not present

## 2020-04-02 DIAGNOSIS — M353 Polymyalgia rheumatica: Secondary | ICD-10-CM | POA: Diagnosis not present

## 2020-04-02 DIAGNOSIS — R945 Abnormal results of liver function studies: Secondary | ICD-10-CM | POA: Diagnosis not present

## 2020-04-05 DIAGNOSIS — M961 Postlaminectomy syndrome, not elsewhere classified: Secondary | ICD-10-CM | POA: Diagnosis not present

## 2020-04-05 DIAGNOSIS — I5042 Chronic combined systolic (congestive) and diastolic (congestive) heart failure: Secondary | ICD-10-CM | POA: Diagnosis not present

## 2020-04-05 DIAGNOSIS — N183 Chronic kidney disease, stage 3 unspecified: Secondary | ICD-10-CM | POA: Diagnosis not present

## 2020-04-05 DIAGNOSIS — M48062 Spinal stenosis, lumbar region with neurogenic claudication: Secondary | ICD-10-CM | POA: Diagnosis not present

## 2020-04-05 DIAGNOSIS — G894 Chronic pain syndrome: Secondary | ICD-10-CM | POA: Diagnosis not present

## 2020-04-05 DIAGNOSIS — I130 Hypertensive heart and chronic kidney disease with heart failure and stage 1 through stage 4 chronic kidney disease, or unspecified chronic kidney disease: Secondary | ICD-10-CM | POA: Diagnosis not present

## 2020-04-08 ENCOUNTER — Ambulatory Visit (INDEPENDENT_AMBULATORY_CARE_PROVIDER_SITE_OTHER): Payer: Medicare Other

## 2020-04-08 DIAGNOSIS — M961 Postlaminectomy syndrome, not elsewhere classified: Secondary | ICD-10-CM | POA: Diagnosis not present

## 2020-04-08 DIAGNOSIS — R011 Cardiac murmur, unspecified: Secondary | ICD-10-CM | POA: Diagnosis not present

## 2020-04-08 DIAGNOSIS — I130 Hypertensive heart and chronic kidney disease with heart failure and stage 1 through stage 4 chronic kidney disease, or unspecified chronic kidney disease: Secondary | ICD-10-CM | POA: Diagnosis not present

## 2020-04-08 DIAGNOSIS — M48062 Spinal stenosis, lumbar region with neurogenic claudication: Secondary | ICD-10-CM | POA: Diagnosis not present

## 2020-04-08 DIAGNOSIS — M353 Polymyalgia rheumatica: Secondary | ICD-10-CM | POA: Diagnosis not present

## 2020-04-08 DIAGNOSIS — G894 Chronic pain syndrome: Secondary | ICD-10-CM | POA: Diagnosis not present

## 2020-04-08 DIAGNOSIS — I5042 Chronic combined systolic (congestive) and diastolic (congestive) heart failure: Secondary | ICD-10-CM | POA: Diagnosis not present

## 2020-04-08 DIAGNOSIS — M6281 Muscle weakness (generalized): Secondary | ICD-10-CM | POA: Diagnosis not present

## 2020-04-08 DIAGNOSIS — Z6841 Body Mass Index (BMI) 40.0 and over, adult: Secondary | ICD-10-CM | POA: Diagnosis not present

## 2020-04-08 DIAGNOSIS — R262 Difficulty in walking, not elsewhere classified: Secondary | ICD-10-CM | POA: Diagnosis not present

## 2020-04-08 DIAGNOSIS — N183 Chronic kidney disease, stage 3 unspecified: Secondary | ICD-10-CM | POA: Diagnosis not present

## 2020-04-13 ENCOUNTER — Telehealth: Payer: Self-pay | Admitting: *Deleted

## 2020-04-13 NOTE — Telephone Encounter (Signed)
Patient informed. Copy sent to PCP °

## 2020-04-13 NOTE — Telephone Encounter (Signed)
-----   Message from Verta Ellen., NP sent at 04/09/2020  2:11 PM EDT ----- Please call the patient and let her know the echocardiogram showed the pumping function of her heart was good. Her heart is a little stiff and has trouble relaxing when trying to fill. Management is taking you blood pressure medication. Two mildly leaking valves but just very mild. This normal the older we get.

## 2020-04-14 ENCOUNTER — Other Ambulatory Visit: Payer: Self-pay | Admitting: Rheumatology

## 2020-04-14 NOTE — Telephone Encounter (Signed)
Last Visit: 03/30/2020 Next Visit: 06/01/2020  Current Dose per office note on 03/30/2020: prednisone 10 mg by mouth daily.  Okay to refill per Dr. Estanislado Pandy

## 2020-04-15 ENCOUNTER — Encounter: Payer: Self-pay | Admitting: Nutrition

## 2020-04-15 ENCOUNTER — Other Ambulatory Visit: Payer: Self-pay

## 2020-04-15 ENCOUNTER — Encounter: Payer: Medicare Other | Attending: Family Medicine | Admitting: Nutrition

## 2020-04-15 DIAGNOSIS — R739 Hyperglycemia, unspecified: Secondary | ICD-10-CM | POA: Insufficient documentation

## 2020-04-15 DIAGNOSIS — N1832 Chronic kidney disease, stage 3b: Secondary | ICD-10-CM | POA: Diagnosis present

## 2020-04-15 DIAGNOSIS — I130 Hypertensive heart and chronic kidney disease with heart failure and stage 1 through stage 4 chronic kidney disease, or unspecified chronic kidney disease: Secondary | ICD-10-CM | POA: Diagnosis not present

## 2020-04-15 DIAGNOSIS — Z6841 Body Mass Index (BMI) 40.0 and over, adult: Secondary | ICD-10-CM | POA: Diagnosis present

## 2020-04-15 DIAGNOSIS — M961 Postlaminectomy syndrome, not elsewhere classified: Secondary | ICD-10-CM | POA: Diagnosis not present

## 2020-04-15 DIAGNOSIS — I10 Essential (primary) hypertension: Secondary | ICD-10-CM | POA: Insufficient documentation

## 2020-04-15 DIAGNOSIS — G894 Chronic pain syndrome: Secondary | ICD-10-CM | POA: Diagnosis not present

## 2020-04-15 DIAGNOSIS — M48062 Spinal stenosis, lumbar region with neurogenic claudication: Secondary | ICD-10-CM | POA: Diagnosis not present

## 2020-04-15 NOTE — Patient Instructions (Addendum)
Goals  Follow My Plate Eat 2 carbs choices per meal Add eggs for breakfast Increase fresh fruit and vegetables Eat meals on times Do not eat after 7 pm Do not eat when bored between meals. Lose 2 lbs per month.

## 2020-04-15 NOTE — Progress Notes (Signed)
  Medical Nutrition Therapy:  Appt start time: 1500 end time:  1600.   Assessment:  Primary concerns today: Obesity, CKD 3B, HTN. Her aide  is here with her,  Wells Guiles. She has sons and family that are encouraging her to eat healthier and lose weight. Her son is very health conscious and has helped her work on eating better food choices. She lives by herself. Her 74 yr old nephew stays a few hours a day.  Has back issues and hip issues. Not able to get up and walk for exercise much. Willing to do some chair exercises.  She is very limited with walking and mainly in wheelchair most of the time.  She has been getting some therapy.  She is motivated to make lifestyle changes to help her lose weight and improve her health with the support of her aide and family.   CMP Latest Ref Rng & Units 10/30/2019 09/12/2019 07/15/2019  Glucose 65 - 99 mg/dL 94 97 81  BUN 8 - 27 mg/dL 14 23 33(H)  Creatinine 0.57 - 1.00 mg/dL 1.40(H) 1.64(H) 1.57(H)  Sodium 134 - 144 mmol/L 139 144 142  Potassium 3.5 - 5.2 mmol/L 4.6 4.2 4.5  Chloride 96 - 106 mmol/L 99 99 99  CO2 20 - 29 mmol/L 24 28 30   Calcium 8.7 - 10.3 mg/dL 9.6 9.9 9.4  Total Protein 6.0 - 8.5 g/dL 7.1 7.1 6.9  Total Bilirubin 0.0 - 1.2 mg/dL 0.3 0.2 0.4  Alkaline Phos 39 - 117 IU/L 158(H) 153(H) -  AST 0 - 40 IU/L 36 24 22  ALT 0 - 32 IU/L 36(H) 31 19   Lab Results  Component Value Date   HGBA1C 5.7 (H) 04/04/2017    Preferred Learning Style:   No preference indicated   Learning Readiness:   Ready  Change in progress   MEDICATIONS:   DIETARY INTAKE:   24-hr recall:  6-730: Oatmeal and coffee with stevia, yogurt and 1/2 banana,    L) salad spinach,mix, ranch dressing with apple cider vinegar, water or crystal light  Snack: whole wheat snacks wheat thins, water  D) Healthy Choice, water  Usual physical activity: ADL   Estimated energy needs: 1200  calories 135 g carbohydrates 90 g protein 33 g fat  Progress  Towards Goal(s):  In progress.   Nutritional Diagnosis:  NB-1.1 Food and nutrition-related knowledge deficit As related to obesity.  As evidenced by BMI 47.    Intervention:  Nutrition and obesity education provided on My Plate, CHO counting, meal planning, portion sizes, timing of meals, avoiding snacks between meals,  taking medications as prescribed, benefits of chair exercising 15 minutes per day and prevention of DM.  [place] KitchenGoals  Follow My Plate Eat 2 carbs choices per meal Add eggs for breakfast Increase fresh fruit and vegetables Eat meals on times Do not eat after 7 pm Do not eat when bored between meals. Lose 2 lbs per month.  Teaching Method Utilized:  Visual Auditory Hands on  Handouts given during visit include:  The Plate Method    Meal Plan Card  Weight loss tips   Barriers to learning/adherence to lifestyle change: limited mobility  Demonstrated degree of understanding via:  Teach Back   Monitoring/Evaluation:  Dietary intake, exercise, , and body weight in 1 month(s).

## 2020-04-16 DIAGNOSIS — I5042 Chronic combined systolic (congestive) and diastolic (congestive) heart failure: Secondary | ICD-10-CM | POA: Diagnosis not present

## 2020-04-16 DIAGNOSIS — M961 Postlaminectomy syndrome, not elsewhere classified: Secondary | ICD-10-CM | POA: Diagnosis not present

## 2020-04-16 DIAGNOSIS — N183 Chronic kidney disease, stage 3 unspecified: Secondary | ICD-10-CM | POA: Diagnosis not present

## 2020-04-16 DIAGNOSIS — I130 Hypertensive heart and chronic kidney disease with heart failure and stage 1 through stage 4 chronic kidney disease, or unspecified chronic kidney disease: Secondary | ICD-10-CM | POA: Diagnosis not present

## 2020-04-16 DIAGNOSIS — G894 Chronic pain syndrome: Secondary | ICD-10-CM | POA: Diagnosis not present

## 2020-04-16 DIAGNOSIS — M48062 Spinal stenosis, lumbar region with neurogenic claudication: Secondary | ICD-10-CM | POA: Diagnosis not present

## 2020-04-22 ENCOUNTER — Telehealth: Payer: Self-pay | Admitting: Rheumatology

## 2020-04-22 DIAGNOSIS — N183 Chronic kidney disease, stage 3 unspecified: Secondary | ICD-10-CM | POA: Diagnosis not present

## 2020-04-22 DIAGNOSIS — M961 Postlaminectomy syndrome, not elsewhere classified: Secondary | ICD-10-CM | POA: Diagnosis not present

## 2020-04-22 DIAGNOSIS — I130 Hypertensive heart and chronic kidney disease with heart failure and stage 1 through stage 4 chronic kidney disease, or unspecified chronic kidney disease: Secondary | ICD-10-CM | POA: Diagnosis not present

## 2020-04-22 DIAGNOSIS — M48062 Spinal stenosis, lumbar region with neurogenic claudication: Secondary | ICD-10-CM | POA: Diagnosis not present

## 2020-04-22 DIAGNOSIS — G894 Chronic pain syndrome: Secondary | ICD-10-CM | POA: Diagnosis not present

## 2020-04-22 DIAGNOSIS — I5042 Chronic combined systolic (congestive) and diastolic (congestive) heart failure: Secondary | ICD-10-CM | POA: Diagnosis not present

## 2020-04-22 NOTE — Telephone Encounter (Addendum)
Patient advised she is due for lab work in August 2021.

## 2020-04-22 NOTE — Telephone Encounter (Signed)
Patient is requesting a return call to let her know when she is due for labwork.

## 2020-04-23 DIAGNOSIS — M961 Postlaminectomy syndrome, not elsewhere classified: Secondary | ICD-10-CM | POA: Diagnosis not present

## 2020-04-23 DIAGNOSIS — I5042 Chronic combined systolic (congestive) and diastolic (congestive) heart failure: Secondary | ICD-10-CM | POA: Diagnosis not present

## 2020-04-23 DIAGNOSIS — N183 Chronic kidney disease, stage 3 unspecified: Secondary | ICD-10-CM | POA: Diagnosis not present

## 2020-04-23 DIAGNOSIS — M48062 Spinal stenosis, lumbar region with neurogenic claudication: Secondary | ICD-10-CM | POA: Diagnosis not present

## 2020-04-23 DIAGNOSIS — I130 Hypertensive heart and chronic kidney disease with heart failure and stage 1 through stage 4 chronic kidney disease, or unspecified chronic kidney disease: Secondary | ICD-10-CM | POA: Diagnosis not present

## 2020-04-23 DIAGNOSIS — G894 Chronic pain syndrome: Secondary | ICD-10-CM | POA: Diagnosis not present

## 2020-04-27 DIAGNOSIS — I5042 Chronic combined systolic (congestive) and diastolic (congestive) heart failure: Secondary | ICD-10-CM | POA: Diagnosis not present

## 2020-04-27 DIAGNOSIS — M48062 Spinal stenosis, lumbar region with neurogenic claudication: Secondary | ICD-10-CM | POA: Diagnosis not present

## 2020-04-27 DIAGNOSIS — M961 Postlaminectomy syndrome, not elsewhere classified: Secondary | ICD-10-CM | POA: Diagnosis not present

## 2020-04-27 DIAGNOSIS — G894 Chronic pain syndrome: Secondary | ICD-10-CM | POA: Diagnosis not present

## 2020-04-27 DIAGNOSIS — N183 Chronic kidney disease, stage 3 unspecified: Secondary | ICD-10-CM | POA: Diagnosis not present

## 2020-04-27 DIAGNOSIS — I130 Hypertensive heart and chronic kidney disease with heart failure and stage 1 through stage 4 chronic kidney disease, or unspecified chronic kidney disease: Secondary | ICD-10-CM | POA: Diagnosis not present

## 2020-04-29 DIAGNOSIS — I130 Hypertensive heart and chronic kidney disease with heart failure and stage 1 through stage 4 chronic kidney disease, or unspecified chronic kidney disease: Secondary | ICD-10-CM | POA: Diagnosis not present

## 2020-04-29 DIAGNOSIS — M961 Postlaminectomy syndrome, not elsewhere classified: Secondary | ICD-10-CM | POA: Diagnosis not present

## 2020-04-29 DIAGNOSIS — N183 Chronic kidney disease, stage 3 unspecified: Secondary | ICD-10-CM | POA: Diagnosis not present

## 2020-04-29 DIAGNOSIS — M48062 Spinal stenosis, lumbar region with neurogenic claudication: Secondary | ICD-10-CM | POA: Diagnosis not present

## 2020-04-29 DIAGNOSIS — I5042 Chronic combined systolic (congestive) and diastolic (congestive) heart failure: Secondary | ICD-10-CM | POA: Diagnosis not present

## 2020-04-29 DIAGNOSIS — G894 Chronic pain syndrome: Secondary | ICD-10-CM | POA: Diagnosis not present

## 2020-05-03 DIAGNOSIS — I5042 Chronic combined systolic (congestive) and diastolic (congestive) heart failure: Secondary | ICD-10-CM | POA: Diagnosis not present

## 2020-05-03 DIAGNOSIS — N183 Chronic kidney disease, stage 3 unspecified: Secondary | ICD-10-CM | POA: Diagnosis not present

## 2020-05-03 DIAGNOSIS — I130 Hypertensive heart and chronic kidney disease with heart failure and stage 1 through stage 4 chronic kidney disease, or unspecified chronic kidney disease: Secondary | ICD-10-CM | POA: Diagnosis not present

## 2020-05-03 DIAGNOSIS — M48062 Spinal stenosis, lumbar region with neurogenic claudication: Secondary | ICD-10-CM | POA: Diagnosis not present

## 2020-05-03 DIAGNOSIS — G894 Chronic pain syndrome: Secondary | ICD-10-CM | POA: Diagnosis not present

## 2020-05-03 DIAGNOSIS — M961 Postlaminectomy syndrome, not elsewhere classified: Secondary | ICD-10-CM | POA: Diagnosis not present

## 2020-05-05 DIAGNOSIS — N183 Chronic kidney disease, stage 3 unspecified: Secondary | ICD-10-CM | POA: Diagnosis not present

## 2020-05-05 DIAGNOSIS — M48062 Spinal stenosis, lumbar region with neurogenic claudication: Secondary | ICD-10-CM | POA: Diagnosis not present

## 2020-05-05 DIAGNOSIS — I130 Hypertensive heart and chronic kidney disease with heart failure and stage 1 through stage 4 chronic kidney disease, or unspecified chronic kidney disease: Secondary | ICD-10-CM | POA: Diagnosis not present

## 2020-05-05 DIAGNOSIS — G894 Chronic pain syndrome: Secondary | ICD-10-CM | POA: Diagnosis not present

## 2020-05-05 DIAGNOSIS — I5042 Chronic combined systolic (congestive) and diastolic (congestive) heart failure: Secondary | ICD-10-CM | POA: Diagnosis not present

## 2020-05-05 DIAGNOSIS — M961 Postlaminectomy syndrome, not elsewhere classified: Secondary | ICD-10-CM | POA: Diagnosis not present

## 2020-05-08 DIAGNOSIS — Z6841 Body Mass Index (BMI) 40.0 and over, adult: Secondary | ICD-10-CM | POA: Diagnosis not present

## 2020-05-08 DIAGNOSIS — R262 Difficulty in walking, not elsewhere classified: Secondary | ICD-10-CM | POA: Diagnosis not present

## 2020-05-08 DIAGNOSIS — G894 Chronic pain syndrome: Secondary | ICD-10-CM | POA: Diagnosis not present

## 2020-05-08 DIAGNOSIS — I5042 Chronic combined systolic (congestive) and diastolic (congestive) heart failure: Secondary | ICD-10-CM | POA: Diagnosis not present

## 2020-05-08 DIAGNOSIS — I130 Hypertensive heart and chronic kidney disease with heart failure and stage 1 through stage 4 chronic kidney disease, or unspecified chronic kidney disease: Secondary | ICD-10-CM | POA: Diagnosis not present

## 2020-05-08 DIAGNOSIS — M353 Polymyalgia rheumatica: Secondary | ICD-10-CM | POA: Diagnosis not present

## 2020-05-08 DIAGNOSIS — N183 Chronic kidney disease, stage 3 unspecified: Secondary | ICD-10-CM | POA: Diagnosis not present

## 2020-05-08 DIAGNOSIS — M48062 Spinal stenosis, lumbar region with neurogenic claudication: Secondary | ICD-10-CM | POA: Diagnosis not present

## 2020-05-08 DIAGNOSIS — M961 Postlaminectomy syndrome, not elsewhere classified: Secondary | ICD-10-CM | POA: Diagnosis not present

## 2020-05-08 DIAGNOSIS — M6281 Muscle weakness (generalized): Secondary | ICD-10-CM | POA: Diagnosis not present

## 2020-05-10 DIAGNOSIS — M533 Sacrococcygeal disorders, not elsewhere classified: Secondary | ICD-10-CM | POA: Diagnosis not present

## 2020-05-10 DIAGNOSIS — M7061 Trochanteric bursitis, right hip: Secondary | ICD-10-CM | POA: Diagnosis not present

## 2020-05-10 DIAGNOSIS — Z79899 Other long term (current) drug therapy: Secondary | ICD-10-CM | POA: Diagnosis not present

## 2020-05-10 DIAGNOSIS — G8929 Other chronic pain: Secondary | ICD-10-CM | POA: Diagnosis not present

## 2020-05-10 DIAGNOSIS — Z9889 Other specified postprocedural states: Secondary | ICD-10-CM | POA: Diagnosis not present

## 2020-05-10 DIAGNOSIS — Z791 Long term (current) use of non-steroidal anti-inflammatories (NSAID): Secondary | ICD-10-CM | POA: Diagnosis not present

## 2020-05-10 DIAGNOSIS — M545 Low back pain: Secondary | ICD-10-CM | POA: Diagnosis not present

## 2020-05-10 DIAGNOSIS — Z87891 Personal history of nicotine dependence: Secondary | ICD-10-CM | POA: Diagnosis not present

## 2020-05-11 DIAGNOSIS — M7061 Trochanteric bursitis, right hip: Secondary | ICD-10-CM | POA: Diagnosis not present

## 2020-05-11 DIAGNOSIS — M533 Sacrococcygeal disorders, not elsewhere classified: Secondary | ICD-10-CM | POA: Diagnosis not present

## 2020-05-12 DIAGNOSIS — M48062 Spinal stenosis, lumbar region with neurogenic claudication: Secondary | ICD-10-CM | POA: Diagnosis not present

## 2020-05-12 DIAGNOSIS — I130 Hypertensive heart and chronic kidney disease with heart failure and stage 1 through stage 4 chronic kidney disease, or unspecified chronic kidney disease: Secondary | ICD-10-CM | POA: Diagnosis not present

## 2020-05-12 DIAGNOSIS — N183 Chronic kidney disease, stage 3 unspecified: Secondary | ICD-10-CM | POA: Diagnosis not present

## 2020-05-12 DIAGNOSIS — I5042 Chronic combined systolic (congestive) and diastolic (congestive) heart failure: Secondary | ICD-10-CM | POA: Diagnosis not present

## 2020-05-12 DIAGNOSIS — M961 Postlaminectomy syndrome, not elsewhere classified: Secondary | ICD-10-CM | POA: Diagnosis not present

## 2020-05-12 DIAGNOSIS — G894 Chronic pain syndrome: Secondary | ICD-10-CM | POA: Diagnosis not present

## 2020-05-13 DIAGNOSIS — M48062 Spinal stenosis, lumbar region with neurogenic claudication: Secondary | ICD-10-CM | POA: Diagnosis not present

## 2020-05-13 DIAGNOSIS — G894 Chronic pain syndrome: Secondary | ICD-10-CM | POA: Diagnosis not present

## 2020-05-13 DIAGNOSIS — I130 Hypertensive heart and chronic kidney disease with heart failure and stage 1 through stage 4 chronic kidney disease, or unspecified chronic kidney disease: Secondary | ICD-10-CM | POA: Diagnosis not present

## 2020-05-13 DIAGNOSIS — M961 Postlaminectomy syndrome, not elsewhere classified: Secondary | ICD-10-CM | POA: Diagnosis not present

## 2020-05-13 DIAGNOSIS — N183 Chronic kidney disease, stage 3 unspecified: Secondary | ICD-10-CM | POA: Diagnosis not present

## 2020-05-13 DIAGNOSIS — I5042 Chronic combined systolic (congestive) and diastolic (congestive) heart failure: Secondary | ICD-10-CM | POA: Diagnosis not present

## 2020-05-17 ENCOUNTER — Ambulatory Visit: Payer: TRICARE For Life (TFL) | Admitting: Nutrition

## 2020-05-18 DIAGNOSIS — M48062 Spinal stenosis, lumbar region with neurogenic claudication: Secondary | ICD-10-CM | POA: Diagnosis not present

## 2020-05-18 DIAGNOSIS — G894 Chronic pain syndrome: Secondary | ICD-10-CM | POA: Diagnosis not present

## 2020-05-18 DIAGNOSIS — I130 Hypertensive heart and chronic kidney disease with heart failure and stage 1 through stage 4 chronic kidney disease, or unspecified chronic kidney disease: Secondary | ICD-10-CM | POA: Diagnosis not present

## 2020-05-18 DIAGNOSIS — M961 Postlaminectomy syndrome, not elsewhere classified: Secondary | ICD-10-CM | POA: Diagnosis not present

## 2020-05-18 DIAGNOSIS — N183 Chronic kidney disease, stage 3 unspecified: Secondary | ICD-10-CM | POA: Diagnosis not present

## 2020-05-18 DIAGNOSIS — I5042 Chronic combined systolic (congestive) and diastolic (congestive) heart failure: Secondary | ICD-10-CM | POA: Diagnosis not present

## 2020-05-20 ENCOUNTER — Other Ambulatory Visit: Payer: Self-pay | Admitting: Rheumatology

## 2020-05-20 ENCOUNTER — Telehealth: Payer: Self-pay | Admitting: Rheumatology

## 2020-05-20 MED ORDER — LEFLUNOMIDE 10 MG PO TABS: 10.0000 mg | ORAL_TABLET | Freq: Every day | ORAL | 0 refills | Status: DC

## 2020-05-20 MED ORDER — PREDNISONE 5 MG PO TABS: ORAL_TABLET | ORAL | 0 refills | Status: DC

## 2020-05-20 NOTE — Telephone Encounter (Signed)
Patient left a voicemail requesting prescription refill of Prednisone and Arava.

## 2020-05-20 NOTE — Telephone Encounter (Signed)
Last Visit: 03/30/2020 Next Visit: 06/01/2020 Labs 03/03/2020 stable  Current Dose per office note on 03/30/2020: Arava 10 mg 1 tablet by mouth daily and prednisone 10 mg by mouth daily.

## 2020-05-26 DIAGNOSIS — M48062 Spinal stenosis, lumbar region with neurogenic claudication: Secondary | ICD-10-CM | POA: Diagnosis not present

## 2020-05-26 DIAGNOSIS — I5042 Chronic combined systolic (congestive) and diastolic (congestive) heart failure: Secondary | ICD-10-CM | POA: Diagnosis not present

## 2020-05-26 DIAGNOSIS — M961 Postlaminectomy syndrome, not elsewhere classified: Secondary | ICD-10-CM | POA: Diagnosis not present

## 2020-05-26 DIAGNOSIS — I130 Hypertensive heart and chronic kidney disease with heart failure and stage 1 through stage 4 chronic kidney disease, or unspecified chronic kidney disease: Secondary | ICD-10-CM | POA: Diagnosis not present

## 2020-05-26 DIAGNOSIS — G894 Chronic pain syndrome: Secondary | ICD-10-CM | POA: Diagnosis not present

## 2020-05-26 DIAGNOSIS — N183 Chronic kidney disease, stage 3 unspecified: Secondary | ICD-10-CM | POA: Diagnosis not present

## 2020-05-27 DIAGNOSIS — Z0000 Encounter for general adult medical examination without abnormal findings: Secondary | ICD-10-CM | POA: Diagnosis not present

## 2020-05-27 DIAGNOSIS — M5136 Other intervertebral disc degeneration, lumbar region: Secondary | ICD-10-CM | POA: Diagnosis not present

## 2020-05-27 DIAGNOSIS — M25551 Pain in right hip: Secondary | ICD-10-CM | POA: Diagnosis not present

## 2020-05-27 DIAGNOSIS — Z1389 Encounter for screening for other disorder: Secondary | ICD-10-CM | POA: Diagnosis not present

## 2020-05-27 DIAGNOSIS — Z6841 Body Mass Index (BMI) 40.0 and over, adult: Secondary | ICD-10-CM | POA: Diagnosis not present

## 2020-05-27 DIAGNOSIS — E039 Hypothyroidism, unspecified: Secondary | ICD-10-CM | POA: Diagnosis not present

## 2020-05-27 DIAGNOSIS — I10 Essential (primary) hypertension: Secondary | ICD-10-CM | POA: Diagnosis not present

## 2020-05-27 DIAGNOSIS — E782 Mixed hyperlipidemia: Secondary | ICD-10-CM | POA: Diagnosis not present

## 2020-06-01 ENCOUNTER — Ambulatory Visit: Payer: TRICARE For Life (TFL) | Admitting: Physician Assistant

## 2020-06-03 DIAGNOSIS — I130 Hypertensive heart and chronic kidney disease with heart failure and stage 1 through stage 4 chronic kidney disease, or unspecified chronic kidney disease: Secondary | ICD-10-CM | POA: Diagnosis not present

## 2020-06-03 DIAGNOSIS — G894 Chronic pain syndrome: Secondary | ICD-10-CM | POA: Diagnosis not present

## 2020-06-03 DIAGNOSIS — I5042 Chronic combined systolic (congestive) and diastolic (congestive) heart failure: Secondary | ICD-10-CM | POA: Diagnosis not present

## 2020-06-03 DIAGNOSIS — M48062 Spinal stenosis, lumbar region with neurogenic claudication: Secondary | ICD-10-CM | POA: Diagnosis not present

## 2020-06-03 DIAGNOSIS — M961 Postlaminectomy syndrome, not elsewhere classified: Secondary | ICD-10-CM | POA: Diagnosis not present

## 2020-06-03 DIAGNOSIS — N183 Chronic kidney disease, stage 3 unspecified: Secondary | ICD-10-CM | POA: Diagnosis not present

## 2020-06-07 ENCOUNTER — Other Ambulatory Visit: Payer: Self-pay

## 2020-06-07 ENCOUNTER — Encounter: Payer: Self-pay | Admitting: Physician Assistant

## 2020-06-07 ENCOUNTER — Ambulatory Visit (INDEPENDENT_AMBULATORY_CARE_PROVIDER_SITE_OTHER): Payer: Medicare Other | Admitting: Physician Assistant

## 2020-06-07 VITALS — BP 125/79 | HR 88 | Resp 16 | Ht 60.0 in | Wt 239.6 lb

## 2020-06-07 DIAGNOSIS — M316 Other giant cell arteritis: Secondary | ICD-10-CM | POA: Diagnosis not present

## 2020-06-07 DIAGNOSIS — M5136 Other intervertebral disc degeneration, lumbar region: Secondary | ICD-10-CM

## 2020-06-07 DIAGNOSIS — R413 Other amnesia: Secondary | ICD-10-CM

## 2020-06-07 DIAGNOSIS — M48062 Spinal stenosis, lumbar region with neurogenic claudication: Secondary | ICD-10-CM | POA: Diagnosis not present

## 2020-06-07 DIAGNOSIS — Z7952 Long term (current) use of systemic steroids: Secondary | ICD-10-CM

## 2020-06-07 DIAGNOSIS — M353 Polymyalgia rheumatica: Secondary | ICD-10-CM

## 2020-06-07 DIAGNOSIS — M170 Bilateral primary osteoarthritis of knee: Secondary | ICD-10-CM

## 2020-06-07 DIAGNOSIS — M4804 Spinal stenosis, thoracic region: Secondary | ICD-10-CM | POA: Diagnosis not present

## 2020-06-07 DIAGNOSIS — I5042 Chronic combined systolic (congestive) and diastolic (congestive) heart failure: Secondary | ICD-10-CM | POA: Diagnosis not present

## 2020-06-07 DIAGNOSIS — Z8719 Personal history of other diseases of the digestive system: Secondary | ICD-10-CM

## 2020-06-07 DIAGNOSIS — N183 Chronic kidney disease, stage 3 unspecified: Secondary | ICD-10-CM | POA: Diagnosis not present

## 2020-06-07 DIAGNOSIS — Z8679 Personal history of other diseases of the circulatory system: Secondary | ICD-10-CM

## 2020-06-07 DIAGNOSIS — E559 Vitamin D deficiency, unspecified: Secondary | ICD-10-CM

## 2020-06-07 DIAGNOSIS — M6281 Muscle weakness (generalized): Secondary | ICD-10-CM | POA: Diagnosis not present

## 2020-06-07 DIAGNOSIS — R262 Difficulty in walking, not elsewhere classified: Secondary | ICD-10-CM | POA: Diagnosis not present

## 2020-06-07 DIAGNOSIS — M51369 Other intervertebral disc degeneration, lumbar region without mention of lumbar back pain or lower extremity pain: Secondary | ICD-10-CM

## 2020-06-07 DIAGNOSIS — R768 Other specified abnormal immunological findings in serum: Secondary | ICD-10-CM

## 2020-06-07 DIAGNOSIS — Z79899 Other long term (current) drug therapy: Secondary | ICD-10-CM | POA: Diagnosis not present

## 2020-06-07 DIAGNOSIS — G894 Chronic pain syndrome: Secondary | ICD-10-CM | POA: Diagnosis not present

## 2020-06-07 DIAGNOSIS — M0579 Rheumatoid arthritis with rheumatoid factor of multiple sites without organ or systems involvement: Secondary | ICD-10-CM

## 2020-06-07 DIAGNOSIS — Z6841 Body Mass Index (BMI) 40.0 and over, adult: Secondary | ICD-10-CM | POA: Diagnosis not present

## 2020-06-07 DIAGNOSIS — I130 Hypertensive heart and chronic kidney disease with heart failure and stage 1 through stage 4 chronic kidney disease, or unspecified chronic kidney disease: Secondary | ICD-10-CM | POA: Diagnosis not present

## 2020-06-07 DIAGNOSIS — M961 Postlaminectomy syndrome, not elsewhere classified: Secondary | ICD-10-CM | POA: Diagnosis not present

## 2020-06-07 NOTE — Progress Notes (Signed)
Office Visit Note  Patient: Nicole [name]             Date of Birth: 03-18-1946           MRN: [id number]             PCP: Sharilyn Sites, MD Referring: Sharilyn Sites, MD Visit Date: 06/07/2020 Occupation: @GUAROCC @  Subjective:  Low back pain  History of Present Illness: Nicole [name] is a 74 y.o. female with history of polymyalgia rheumatica and osteoarthritis. She is taking prednisone 10 mg by mouth daily and arava 10 mg 1 tablet daily.   She is tolerating the reduced dose of Arava without any side effects.  She denies any recent infections.  She states that she continues to have severe lower back pain which has been radiating down the right lower extremity.  She states that she has been primarily wheelchair-bound and continues to work with a home therapist.  She has been using a TENS unit as needed for pain relief.  She is also been using a compounded topical agent and taking Nucynta as prescribed by her pain management specialist.  According to the patient her dose of Nucynta was increased at her last office visit.  She states that her pain is most severe first thing in the morning but states of the last several days her discomfort has been milder.  She has been having increased difficulty with ADLs due to the severity of her lower back pain.  She has been having increased difficulty with grooming and personal hygiene activities.  She states that her fatigue has been significant. She reports   Activities of Daily Living:  Patient reports morning stiffness for 2  hours.   Patient Denies nocturnal pain.  Difficulty dressing/grooming: Reports Difficulty climbing stairs: Reports Difficulty getting out of chair: Reports Difficulty using hands for taps, buttons, cutlery, and/or writing: Reports  Review of Systems  Constitutional: Positive for fatigue.  HENT: Positive for mouth dryness. Negative for mouth sores and nose dryness.   Eyes: Positive for visual disturbance and dryness.    Respiratory: Positive for shortness of breath. Negative for difficulty breathing.   Cardiovascular: Negative for chest pain and palpitations.  Gastrointestinal: Positive for diarrhea. Negative for blood in stool and constipation.  Endocrine: Negative for increased urination.  Genitourinary: Negative for difficulty urinating.  Musculoskeletal: Positive for arthralgias, joint pain, joint swelling, myalgias, morning stiffness, muscle tenderness and myalgias.  Skin: Positive for color change. Negative for rash and redness.  Allergic/Immunologic: Positive for susceptible to infections.  Neurological: Positive for numbness, headaches, memory loss and weakness. Negative for dizziness.  Hematological: Positive for bruising/bleeding tendency.  Psychiatric/Behavioral: Negative for confusion.    PMFS History:  Patient Active Problem List   Diagnosis Date Noted  . Diverticulitis 05/02/2018  . Diverticulitis of colon 05/01/2018  . Morbid obesity with BMI of 50.0-59.9, adult ([place]) 05/01/2018  . Chronic diastolic HF (heart failure) ([place]) 05/01/2018  . On prednisone therapy 05/31/2017  . Positive anti-CCP test 05/31/2017  . Rheumatoid factor positive 05/31/2017  . Memory loss 04/04/2017  . Polymyalgia rheumatica ([place]) 04/04/2017  . High risk medication use 12/07/2016  . DDD (degenerative disc disease), lumbar 09/27/2016  . HTN (hypertension) 06/05/2016  . Chronic pain syndrome 06/05/2016  . Acute diverticulitis 06/02/2016  . Diverticulitis large intestine 06/02/2016  . Spinal stenosis of lumbosacral region 02/29/2016  . Joint pain 02/29/2016  . Endometrial polyp 02/06/2014  . Postmenopausal bleeding 01/30/2014  . GERD (gastroesophageal reflux disease) 09/29/2013  .  Spinal stenosis, thoracic 09/29/2013  . Shingles 09/29/2013  . Thoracic or lumbosacral neuritis or radiculitis, unspecified 05/04/2011  . Abnormality of gait 05/04/2011  . Muscle weakness (generalized) 05/04/2011  . Bilateral  primary osteoarthritis of knee 04/07/2009  . KNEE PAIN 04/07/2009    Past Medical History:  Diagnosis Date  . AC (acromioclavicular) joint bone spurs    lt shoulder  . Anemia   . Arthritis   . Carpal tunnel syndrome, bilateral   . CHF (congestive heart failure) ([place])   . Collagen vascular disease ([place])   . Gastroesophageal reflux   . Headache    recent visit to ER @ [facility] for severe headache  . Hypertension   . Lumbar stenosis    Hx of ESIs by Dr. Nelva Bush  . Polyarthralgia   . Polymyalgia ([place])   . Shingles   . Spinal stenosis     Family History  Problem Relation Age of Onset  . Hypertension Mother   . Pneumonia Father   . Arthritis Sister   . Diabetes Paternal Grandmother    Past Surgical History:  Procedure Laterality Date  . BACK SURGERY  07/05/2018, 07/2018   x2   . CHOLECYSTECTOMY    . COLONOSCOPY  06/11/2012   Procedure: COLONOSCOPY;  Surgeon: Jamesetta So, MD;  Location: [facility];  Service: Gastroenterology;  Laterality: N/A;  . HYSTEROSCOPY WITH D & C N/A 02/25/2014   Procedure: DILATATION AND CURETTAGE /HYSTEROSCOPY;  Surgeon: Florian Buff, MD;  Location: [facility];  Service: Gynecology;  Laterality: N/A;  . POLYPECTOMY N/A 02/25/2014   Procedure: POLYPECTOMY;  Surgeon: Florian Buff, MD;  Location: [facility];  Service: Gynecology;  Laterality: N/A;  . RESECTION DISTAL CLAVICAL Right 03/26/2015   Procedure: OPEN DISTAL CLAVICAL RESECTION ;  Surgeon: Netta Cedars, MD;  Location: [facility];  Service: Orthopedics;  Laterality: Right;   Social History   Social History Narrative   Lives at home alone.   Right-handed.   1-2 cups caffeine daily.   Immunization History  Administered Date(s) Administered  . Influenza,inj,quad, With Preservative 07/23/2017  . Unspecified SARS-COV-2 Vaccination 06/02/2020     Objective: Vital Signs: BP 125/79 (BP Location: Left Wrist, Patient Position: Sitting, Cuff Size: Normal)   Pulse 88   Resp 16   Ht 5' (1.524 m)   Wt 239 lb  9.6 oz (108.7 kg) Comment: per patient  BMI 46.79 kg/m    Physical Exam Vitals and nursing note reviewed.  Constitutional:      Appearance: She is well-developed.  HENT:     Head: Normocephalic and atraumatic.  Eyes:     Conjunctiva/sclera: Conjunctivae normal.  Musculoskeletal:     Cervical back: Normal range of motion.  Lymphadenopathy:     Cervical: No cervical adenopathy.  Skin:    General: Skin is warm and dry.     Capillary Refill: Capillary refill takes less than 2 seconds.  Neurological:     Mental Status: She is alert and oriented to person, place, and time.  Psychiatric:        Behavior: Behavior normal.      Musculoskeletal Exam: C-spine limited ROM.  Shoulder abduction to about 120 degrees bilaterally.  Elbow joints good ROM.  Tenderness and extensor tenosynovitis of the right wrist noted.  Hip joints difficult to assess in seated position.  Knee joints good ROM with no warmth or effusion.  Pedal edema noted bilaterally.   CDAI Exam: CDAI Score: -- Patient Global: --; Provider Global: --  Swollen: 1 ; Tender: 1  Joint Exam 06/07/2020      Right  Left  Wrist  Swollen Tender        Investigation: No additional findings.  Imaging: No results found.  Recent Labs: Lab Results  Component Value Date   WBC 11.3 (H) 10/30/2019   HGB 12.7 10/30/2019   PLT 359 10/30/2019   NA 139 10/30/2019   K 4.6 10/30/2019   CL 99 10/30/2019   CO2 24 10/30/2019   GLUCOSE 94 10/30/2019   BUN 14 10/30/2019   CREATININE 1.40 (H) 10/30/2019   BILITOT 0.3 10/30/2019   ALKPHOS 158 (H) 10/30/2019   AST 36 10/30/2019   ALT 36 (H) 10/30/2019   PROT 7.1 10/30/2019   ALBUMIN 4.1 10/30/2019   CALCIUM 9.6 10/30/2019   GFRAA 43 (L) 10/30/2019    Speciality Comments: PLQ Eye Exam: 12/13/17 WNl @ [facility]  Procedures:  No procedures performed Allergies: Oxycontin [oxycodone hcl], Sulfa antibiotics, Tramadol, Penicillins, and Sulfasalazine   Assessment / Plan:      Visit Diagnoses: Temporal arteritis ([place]): Declined biopsy.  No recurrence of symptoms.  She is currently taking prednisone 5 mg 2 tablets daily and Arava 10 mg 1 tablet by mouth daily.  She will continue on the current treatment regimen.  She was advised to notify us if she develops signs or symptoms of a flare.   Polymyalgia rheumatica ([place]):  She continues to have chronic pain and stiffness in multiple joints. She is primarily wheelchair bound due to chronic lower back pain. She has ongoing muscle weakness due to muscle deconditioning.  She is currently taking Prednisone 10 mg daily and Arava 10 mg daily.  She presents today with extensor tenosynovitis of the right wrist joint.  We discussed adding on Actemra sq weekly injections for management of RA and history of temporal arteritis and PMR.  Indications, contraindications, and potential side effects of Actemra were discussed today.  She would like to review the informational handout and further discuss with her son prior to making a decision.  She will continue on the current treatment regimen for now.  Rheumatoid arthritis involving multiple sites with positive rheumatoid factor ([place]): +RF, +CCP: She presents today with extensor tenosynovitis of the right wrist joint.  She has chronic pain in multiple joints including both shoulder joints, both wrist joints, and both knee joints.  She has a history of positive rheumatoid factor and positive anti-CCP.  She is currently on Arava 10 mg 1 tablet by mouth daily and Prednisone 10 mg daily.  We discussed adding on Actemra 162 mg sq injections every week for the management of rheumatoid arthritis and due to her history of PMR and temporal arteritis we feel that Actemra would be a good treatment option.  Indications, contraindications, potential side effects of Actemra were discussed today.  All questions were addressed.  She was given a handout of information to review with her son and will notify us if she  would like to proceed with applying for Actemra through her insurance.  Medication counseling:   Baseline Immunosuppressant Therapy Labs        No results found for: HIV     Serum Protein Electrophoresis Latest Ref Rng & Units 10/30/2019  Total Protein 6.0 - 8.5 g/dL 7.1  Albumin 3.8 - 4.8 g/dL -  Alpha-1 0.2 - 0.3 g/dL -  Alpha-2 0.5 - 0.9 g/dL -  Beta Globulin 0.4 - 0.6 g/dL -  Beta 2 0.2 - 0.5  g/dL -  Gamma Globulin 0.8 - 1.7 g/dL -    No results found for: G6PDH  No results found for: TPMT   Lipid Panel No results found for: CHOL, HDL, LDLCALC, LDLDIRECT, TRIG, CHOLHDL   Chest x-ray: 04/16/18  Counseled patient that Actemra is an IL-6 blocking agent.   Counseled patient on purpose, proper use, and adverse effects of Actemra.  Reviewed the most common adverse effects including infections, injection site reaction, bowel injury, and rarely cancer and conditions of the nervous system.  Reviewed that the medication should be held during infections.  Discussed that there is the possibility of an increased risk of malignancy but it is not well understood if this increased risk is due to the medication or the disease state.  Counseled patient that Actemra should be held prior to scheduled surgery.  Counseled patient to avoid live vaccines while on Actemra.  Recommend annual influenza, Pneumovax 23, Prevnar 13, and Shingrix as indicated.   Reviewed the importance of regular labs while on Actemra therapy including the need for routine lipid panel.  Advised patient to get standing labs one month after starting Actemra.  Provided patient with standing lab orders.    Provided patient with medication education material and answered all questions.  Patient voiced understanding.  Patient consented to Actemra.  Will upload consent into the media tab.  Reviewed storage instructions of Actemra with patient.  Advised patient that initial Actemra injection must be given in the office.  Will apply for  Actemra through patient's insurance.    Patient dose will be 162 mg every 7 days based on weight >100 kg .  Prescription pending lab results and/or insurance approval.  High risk medication use: Arava 10 mg 1 tablet by mouth daily and prednisone 10 mg po daily. CBC and CMP drawn on 03/16/20.  She is due to update lab work.   On prednisone therapy: She is currently taking prednisone 10 mg daily.   Rheumatoid factor positive  Positive anti-CCP test  Chronic pain syndrome: She is followed by pain management.  She is currently taking nucynta as prescribed for pain relief.   Primary osteoarthritis of both knees: She has good ROM of both knee joints.  No warmth or effusion of knee joints noted.   DDD (degenerative disc disease), lumbar: Chronic pain.  She is no longer a surgical candidate.  She is followed by pain management. She continues to work with home therapy.   Spinal stenosis, thoracic: Chronic pain  Other medical conditions are listed as follows:   Vitamin D deficiency  History of diverticulitis  History of hypertension  History of gastroesophageal reflux (GERD)  Memory loss  Orders: No orders of the defined types were placed in this encounter.  No orders of the defined types were placed in this encounter.   Face-to-face time spent with patient was 30 minutes. Greater than 50% of time was spent in counseling and coordination of care.  Follow-Up Instructions: Return in 3 months (on 09/07/2020) for Polymyalgia Rheumatica, Osteoarthritis.   Ofilia Neas, PA-C  Note - This record has been created using Dragon software.  Chart creation errors have been sought, but may not always  have been located. Such creation errors do not reflect on  the standard of medical care.

## 2020-06-07 NOTE — Patient Instructions (Addendum)
Tocilizumab injection °What is this medicine? °TOCILIZUMAB (TOE si LIZ ue mab) is used to treat rheumatoid arthritis and juvenile idiopathic arthritis. It is also used to treat giant cell arteritis and cytokine release syndrome. °This medicine may be used for other purposes; ask your health care provider or pharmacist if you have questions. °COMMON BRAND NAME(S): Actemra °What should I tell my health care provider before I take this medicine? °They need to know if you have any of these conditions: °· cancer °· diabetes °· heart disease °· hepatitis B or history of hepatitis B infection °· high blood pressure °· high cholesterol °· immune system problems °· infection (especially a virus infection such as chickenpox, cold sores, or herpes) °· liver disease °· low blood counts, like low white cell, platelet, or red cell counts °· multiple sclerosis °· recently received or scheduled to receive a vaccination °· scheduled to have surgery °· stomach or intestine problems °· stroke °· tuberculosis, a positive skin test for tuberculosis, or have recently been in close contact with someone who has tuberculosis °· an unusual or allergic reaction to tocilizumab, other medicines, foods, dyes, or preservatives °· pregnant or trying to get pregnant °· breast-feeding °How should I use this medicine? °This medicine is for infusion into a vein or for injection under the skin. It is usually given by a health care professional in a hospital or clinic setting. If you get this medicine at home, you will be taught how to prepare and give this medicine. Use exactly as directed. Take your medicine at regular intervals. Do not take your medicine more often than directed. °It is important that you put your used needles and syringes in a special sharps container. Do not put them in a trash can. If you do not have a sharps container, call your pharmacist or healthcare provider to get one. °A special MedGuide will be given to you by the  pharmacist with each prescription and refill. Be sure to read this information carefully each time. °Talk to your pediatrician regarding the use of this medicine in children. While the drug may be prescribed for children as young as 2 years for selected conditions, precautions do apply. °Overdosage: If you think you have taken too much of this medicine contact a poison control center or emergency room at once. °NOTE: This medicine is only for you. Do not share this medicine with others. °What if I miss a dose? °It is important not to miss your dose. Call your doctor or health care professional if you are unable to keep an appointment. If you give yourself the medicine and you miss a dose, take it as soon as you can. If it is almost time for your next dose, take only that dose. Do not take double or extra doses. °What may interact with this medicine? °Do not take this medicine with any of the following medications: °· live virus vaccines °This medicine may also interact with the following medications: °· biologic medicines such as abatacept, adalimumab, anakinra, certolizumab, etanercept, golimumab, infliximab, rituximab, secukinumab, ustekinumab °· birth control pills °· certain medicines for cholesterol like atorvastatin, lovastatin, and simvastatin °· cyclosporine °· omeprazole °· steroid medicines like prednisone or cortisone °· theophylline °· vaccines °· warfarin °This list may not describe all possible interactions. Give your health care provider a list of all the medicines, herbs, non-prescription drugs, or dietary supplements you use. Also tell them if you smoke, drink alcohol, or use illegal drugs. Some items may interact with your medicine. °  What should I watch for while using this medicine? Your condition will be monitored carefully while you are receiving this medicine. Tell your doctor or healthcare professional if your symptoms do not start to get better or if they get worse. You may need blood work  done while you are taking this medicine. You will be tested for tuberculosis (TB) before you start this medicine. If your doctor prescribes any medicine for TB, you should start taking the TB medicine before starting this medicine. Make sure to finish the full course of TB medicine. This medicine may increase your risk of getting an infection. Call your doctor or health care professional for advice if you get a fever, chills or sore throat, or other symptoms of a cold or flu. Do not treat yourself. Try to avoid being around people who are sick. Talk to your doctor about your risk of cancer. You may be more at risk for certain types of cancers if you take this medicine. What side effects may I notice from receiving this medicine? Side effects that you should report to your doctor or health care professional as soon as possible:  allergic reactions like skin rash, itching or hives, swelling of the face, lips, or tongue  breathing problems  changes in vision  feeling faint or lightheaded, falls  high blood pressure  signs and symptoms of infection like fever or chills; cough; sore throat; pain or trouble passing urine  signs and symptoms of liver injury like dark yellow or brown urine; general ill feeling or flu-like symptoms; light-colored stools; loss of appetite; nausea; right upper belly pain; unusually weak or tired; yellowing of the eyes or skin  stomach pain  tingling, numbness in the hands or feet  unusual bleeding or bruising  unusually weak or tired Side effects that usually do not require medical attention (report to your doctor or health care professional if they continue or are bothersome):  dizziness  headache  pain, redness, or irritation at site where injected This list may not describe all possible side effects. Call your doctor for medical advice about side effects. You may report side effects to FDA at 1-800-FDA-1088. Where should I keep my medicine? Keep out of  the reach of children. If you are using this medicine at home, you will be instructed on how to store this medicine. Throw away any unused medicine after the expiration date on the label. NOTE: This sheet is a summary. It may not cover all possible information. If you have questions about this medicine, talk to your doctor, pharmacist, or health care provider.  2020 Elsevier/Gold Standard (2019-01-14 20:51:04)  COVID-19 vaccine recommendations:   COVID-19 vaccine is recommended for everyone (unless you are allergic to a vaccine component), even if you are on a medication that suppresses your immune system.   If you are on Methotrexate, Cellcept (mycophenolate), Rinvoq, Nicole [name], and Olumiant- hold the medication for 1 week after each vaccine. Hold Methotrexate for 2 weeks after the single dose COVID-19 vaccine.   If you are on Orencia subcutaneous injection - hold medication one week prior to and one week after the first COVID-19 vaccine dose (only).   If you are on Orencia IV infusions- time vaccination administration so that the first COVID-19 vaccination will occur four weeks after the infusion and postpone the subsequent infusion by one week.   If you are on Cyclophosphamide or Rituxan infusions please contact your doctor prior to receiving the COVID-19 vaccine.   Do not take Tylenol or  ant anti-inflammatory medications (NSAIDs) 24 hours prior to the COVID-19 vaccination.   There is no direct evidence about the efficacy of the COVID-19 vaccine in individuals who are on medications that suppress the immune system.   Even if you are fully vaccinated, and you are on any medications that suppress your immune system, please continue to wear a mask, maintain at least six feet social distance and practice hand hygiene.   If you develop a COVID-19 infection, please contact your PCP or our office to determine if you need antibody infusion.  We anticipate that a booster vaccine will be available  soon for immunosuppressed individuals. Please call our office before receiving your booster dose to make adjustments to your medication regimen.   [email or web address]

## 2020-06-08 DIAGNOSIS — I130 Hypertensive heart and chronic kidney disease with heart failure and stage 1 through stage 4 chronic kidney disease, or unspecified chronic kidney disease: Secondary | ICD-10-CM | POA: Diagnosis not present

## 2020-06-08 DIAGNOSIS — M48062 Spinal stenosis, lumbar region with neurogenic claudication: Secondary | ICD-10-CM | POA: Diagnosis not present

## 2020-06-08 DIAGNOSIS — M961 Postlaminectomy syndrome, not elsewhere classified: Secondary | ICD-10-CM | POA: Diagnosis not present

## 2020-06-08 DIAGNOSIS — G894 Chronic pain syndrome: Secondary | ICD-10-CM | POA: Diagnosis not present

## 2020-06-08 DIAGNOSIS — N183 Chronic kidney disease, stage 3 unspecified: Secondary | ICD-10-CM | POA: Diagnosis not present

## 2020-06-08 DIAGNOSIS — I5042 Chronic combined systolic (congestive) and diastolic (congestive) heart failure: Secondary | ICD-10-CM | POA: Diagnosis not present

## 2020-06-10 DIAGNOSIS — N183 Chronic kidney disease, stage 3 unspecified: Secondary | ICD-10-CM | POA: Diagnosis not present

## 2020-06-10 DIAGNOSIS — M48062 Spinal stenosis, lumbar region with neurogenic claudication: Secondary | ICD-10-CM | POA: Diagnosis not present

## 2020-06-10 DIAGNOSIS — G894 Chronic pain syndrome: Secondary | ICD-10-CM | POA: Diagnosis not present

## 2020-06-10 DIAGNOSIS — I130 Hypertensive heart and chronic kidney disease with heart failure and stage 1 through stage 4 chronic kidney disease, or unspecified chronic kidney disease: Secondary | ICD-10-CM | POA: Diagnosis not present

## 2020-06-10 DIAGNOSIS — I5042 Chronic combined systolic (congestive) and diastolic (congestive) heart failure: Secondary | ICD-10-CM | POA: Diagnosis not present

## 2020-06-10 DIAGNOSIS — M961 Postlaminectomy syndrome, not elsewhere classified: Secondary | ICD-10-CM | POA: Diagnosis not present

## 2020-06-11 DIAGNOSIS — W19XXXA Unspecified fall, initial encounter: Secondary | ICD-10-CM | POA: Diagnosis not present

## 2020-06-11 DIAGNOSIS — R69 Illness, unspecified: Secondary | ICD-10-CM | POA: Diagnosis not present

## 2020-06-14 DIAGNOSIS — N183 Chronic kidney disease, stage 3 unspecified: Secondary | ICD-10-CM | POA: Diagnosis not present

## 2020-06-14 DIAGNOSIS — M48062 Spinal stenosis, lumbar region with neurogenic claudication: Secondary | ICD-10-CM | POA: Diagnosis not present

## 2020-06-14 DIAGNOSIS — I130 Hypertensive heart and chronic kidney disease with heart failure and stage 1 through stage 4 chronic kidney disease, or unspecified chronic kidney disease: Secondary | ICD-10-CM | POA: Diagnosis not present

## 2020-06-14 DIAGNOSIS — M961 Postlaminectomy syndrome, not elsewhere classified: Secondary | ICD-10-CM | POA: Diagnosis not present

## 2020-06-14 DIAGNOSIS — I5042 Chronic combined systolic (congestive) and diastolic (congestive) heart failure: Secondary | ICD-10-CM | POA: Diagnosis not present

## 2020-06-14 DIAGNOSIS — G894 Chronic pain syndrome: Secondary | ICD-10-CM | POA: Diagnosis not present

## 2020-06-16 DIAGNOSIS — M961 Postlaminectomy syndrome, not elsewhere classified: Secondary | ICD-10-CM | POA: Diagnosis not present

## 2020-06-16 DIAGNOSIS — M48062 Spinal stenosis, lumbar region with neurogenic claudication: Secondary | ICD-10-CM | POA: Diagnosis not present

## 2020-06-16 DIAGNOSIS — I130 Hypertensive heart and chronic kidney disease with heart failure and stage 1 through stage 4 chronic kidney disease, or unspecified chronic kidney disease: Secondary | ICD-10-CM | POA: Diagnosis not present

## 2020-06-16 DIAGNOSIS — I5042 Chronic combined systolic (congestive) and diastolic (congestive) heart failure: Secondary | ICD-10-CM | POA: Diagnosis not present

## 2020-06-16 DIAGNOSIS — N183 Chronic kidney disease, stage 3 unspecified: Secondary | ICD-10-CM | POA: Diagnosis not present

## 2020-06-16 DIAGNOSIS — G894 Chronic pain syndrome: Secondary | ICD-10-CM | POA: Diagnosis not present

## 2020-06-17 ENCOUNTER — Other Ambulatory Visit: Payer: Self-pay | Admitting: Rheumatology

## 2020-06-17 ENCOUNTER — Ambulatory Visit: Payer: TRICARE For Life (TFL) | Admitting: Nutrition

## 2020-06-17 NOTE — Telephone Encounter (Signed)
Last visit: 06/07/2020 Next Visit: 09/07/2020  Current Dose per office note on 06/07/2020:  prednisone 5 mg 2 tablets   Okay to refill per Dr. Estanislado Pandy

## 2020-06-21 DIAGNOSIS — G894 Chronic pain syndrome: Secondary | ICD-10-CM | POA: Diagnosis not present

## 2020-06-21 DIAGNOSIS — M48062 Spinal stenosis, lumbar region with neurogenic claudication: Secondary | ICD-10-CM | POA: Diagnosis not present

## 2020-06-21 DIAGNOSIS — N183 Chronic kidney disease, stage 3 unspecified: Secondary | ICD-10-CM | POA: Diagnosis not present

## 2020-06-21 DIAGNOSIS — I130 Hypertensive heart and chronic kidney disease with heart failure and stage 1 through stage 4 chronic kidney disease, or unspecified chronic kidney disease: Secondary | ICD-10-CM | POA: Diagnosis not present

## 2020-06-21 DIAGNOSIS — M961 Postlaminectomy syndrome, not elsewhere classified: Secondary | ICD-10-CM | POA: Diagnosis not present

## 2020-06-21 DIAGNOSIS — I5042 Chronic combined systolic (congestive) and diastolic (congestive) heart failure: Secondary | ICD-10-CM | POA: Diagnosis not present

## 2020-06-22 DIAGNOSIS — M533 Sacrococcygeal disorders, not elsewhere classified: Secondary | ICD-10-CM | POA: Diagnosis not present

## 2020-06-22 DIAGNOSIS — M5416 Radiculopathy, lumbar region: Secondary | ICD-10-CM | POA: Diagnosis not present

## 2020-06-25 DIAGNOSIS — N183 Chronic kidney disease, stage 3 unspecified: Secondary | ICD-10-CM | POA: Diagnosis not present

## 2020-06-25 DIAGNOSIS — G894 Chronic pain syndrome: Secondary | ICD-10-CM | POA: Diagnosis not present

## 2020-06-25 DIAGNOSIS — M48062 Spinal stenosis, lumbar region with neurogenic claudication: Secondary | ICD-10-CM | POA: Diagnosis not present

## 2020-06-25 DIAGNOSIS — I5042 Chronic combined systolic (congestive) and diastolic (congestive) heart failure: Secondary | ICD-10-CM | POA: Diagnosis not present

## 2020-06-25 DIAGNOSIS — M961 Postlaminectomy syndrome, not elsewhere classified: Secondary | ICD-10-CM | POA: Diagnosis not present

## 2020-06-25 DIAGNOSIS — I130 Hypertensive heart and chronic kidney disease with heart failure and stage 1 through stage 4 chronic kidney disease, or unspecified chronic kidney disease: Secondary | ICD-10-CM | POA: Diagnosis not present

## 2020-06-29 DIAGNOSIS — M48062 Spinal stenosis, lumbar region with neurogenic claudication: Secondary | ICD-10-CM | POA: Diagnosis not present

## 2020-06-29 DIAGNOSIS — N183 Chronic kidney disease, stage 3 unspecified: Secondary | ICD-10-CM | POA: Diagnosis not present

## 2020-06-29 DIAGNOSIS — I130 Hypertensive heart and chronic kidney disease with heart failure and stage 1 through stage 4 chronic kidney disease, or unspecified chronic kidney disease: Secondary | ICD-10-CM | POA: Diagnosis not present

## 2020-06-29 DIAGNOSIS — G894 Chronic pain syndrome: Secondary | ICD-10-CM | POA: Diagnosis not present

## 2020-06-29 DIAGNOSIS — M961 Postlaminectomy syndrome, not elsewhere classified: Secondary | ICD-10-CM | POA: Diagnosis not present

## 2020-06-29 DIAGNOSIS — I5042 Chronic combined systolic (congestive) and diastolic (congestive) heart failure: Secondary | ICD-10-CM | POA: Diagnosis not present

## 2020-07-01 DIAGNOSIS — N183 Chronic kidney disease, stage 3 unspecified: Secondary | ICD-10-CM | POA: Diagnosis not present

## 2020-07-01 DIAGNOSIS — I5042 Chronic combined systolic (congestive) and diastolic (congestive) heart failure: Secondary | ICD-10-CM | POA: Diagnosis not present

## 2020-07-01 DIAGNOSIS — G894 Chronic pain syndrome: Secondary | ICD-10-CM | POA: Diagnosis not present

## 2020-07-01 DIAGNOSIS — I130 Hypertensive heart and chronic kidney disease with heart failure and stage 1 through stage 4 chronic kidney disease, or unspecified chronic kidney disease: Secondary | ICD-10-CM | POA: Diagnosis not present

## 2020-07-01 DIAGNOSIS — M48062 Spinal stenosis, lumbar region with neurogenic claudication: Secondary | ICD-10-CM | POA: Diagnosis not present

## 2020-07-01 DIAGNOSIS — M961 Postlaminectomy syndrome, not elsewhere classified: Secondary | ICD-10-CM | POA: Diagnosis not present

## 2020-07-02 DIAGNOSIS — M48062 Spinal stenosis, lumbar region with neurogenic claudication: Secondary | ICD-10-CM | POA: Diagnosis not present

## 2020-07-02 DIAGNOSIS — I130 Hypertensive heart and chronic kidney disease with heart failure and stage 1 through stage 4 chronic kidney disease, or unspecified chronic kidney disease: Secondary | ICD-10-CM | POA: Diagnosis not present

## 2020-07-02 DIAGNOSIS — M961 Postlaminectomy syndrome, not elsewhere classified: Secondary | ICD-10-CM | POA: Diagnosis not present

## 2020-07-02 DIAGNOSIS — G894 Chronic pain syndrome: Secondary | ICD-10-CM | POA: Diagnosis not present

## 2020-07-05 DIAGNOSIS — N183 Chronic kidney disease, stage 3 unspecified: Secondary | ICD-10-CM | POA: Diagnosis not present

## 2020-07-05 DIAGNOSIS — I5042 Chronic combined systolic (congestive) and diastolic (congestive) heart failure: Secondary | ICD-10-CM | POA: Diagnosis not present

## 2020-07-05 DIAGNOSIS — I130 Hypertensive heart and chronic kidney disease with heart failure and stage 1 through stage 4 chronic kidney disease, or unspecified chronic kidney disease: Secondary | ICD-10-CM | POA: Diagnosis not present

## 2020-07-05 DIAGNOSIS — M961 Postlaminectomy syndrome, not elsewhere classified: Secondary | ICD-10-CM | POA: Diagnosis not present

## 2020-07-05 DIAGNOSIS — G894 Chronic pain syndrome: Secondary | ICD-10-CM | POA: Diagnosis not present

## 2020-07-05 DIAGNOSIS — M48062 Spinal stenosis, lumbar region with neurogenic claudication: Secondary | ICD-10-CM | POA: Diagnosis not present

## 2020-07-07 DIAGNOSIS — G894 Chronic pain syndrome: Secondary | ICD-10-CM | POA: Diagnosis not present

## 2020-07-07 DIAGNOSIS — M6281 Muscle weakness (generalized): Secondary | ICD-10-CM | POA: Diagnosis not present

## 2020-07-07 DIAGNOSIS — I5042 Chronic combined systolic (congestive) and diastolic (congestive) heart failure: Secondary | ICD-10-CM | POA: Diagnosis not present

## 2020-07-07 DIAGNOSIS — I130 Hypertensive heart and chronic kidney disease with heart failure and stage 1 through stage 4 chronic kidney disease, or unspecified chronic kidney disease: Secondary | ICD-10-CM | POA: Diagnosis not present

## 2020-07-07 DIAGNOSIS — N183 Chronic kidney disease, stage 3 unspecified: Secondary | ICD-10-CM | POA: Diagnosis not present

## 2020-07-07 DIAGNOSIS — M353 Polymyalgia rheumatica: Secondary | ICD-10-CM | POA: Diagnosis not present

## 2020-07-07 DIAGNOSIS — Z6841 Body Mass Index (BMI) 40.0 and over, adult: Secondary | ICD-10-CM | POA: Diagnosis not present

## 2020-07-07 DIAGNOSIS — M48062 Spinal stenosis, lumbar region with neurogenic claudication: Secondary | ICD-10-CM | POA: Diagnosis not present

## 2020-07-07 DIAGNOSIS — M961 Postlaminectomy syndrome, not elsewhere classified: Secondary | ICD-10-CM | POA: Diagnosis not present

## 2020-07-07 DIAGNOSIS — R262 Difficulty in walking, not elsewhere classified: Secondary | ICD-10-CM | POA: Diagnosis not present

## 2020-07-12 DIAGNOSIS — I130 Hypertensive heart and chronic kidney disease with heart failure and stage 1 through stage 4 chronic kidney disease, or unspecified chronic kidney disease: Secondary | ICD-10-CM | POA: Diagnosis not present

## 2020-07-12 DIAGNOSIS — M48062 Spinal stenosis, lumbar region with neurogenic claudication: Secondary | ICD-10-CM | POA: Diagnosis not present

## 2020-07-12 DIAGNOSIS — N183 Chronic kidney disease, stage 3 unspecified: Secondary | ICD-10-CM | POA: Diagnosis not present

## 2020-07-12 DIAGNOSIS — I5042 Chronic combined systolic (congestive) and diastolic (congestive) heart failure: Secondary | ICD-10-CM | POA: Diagnosis not present

## 2020-07-12 DIAGNOSIS — G894 Chronic pain syndrome: Secondary | ICD-10-CM | POA: Diagnosis not present

## 2020-07-12 DIAGNOSIS — M961 Postlaminectomy syndrome, not elsewhere classified: Secondary | ICD-10-CM | POA: Diagnosis not present

## 2020-07-14 DIAGNOSIS — M48062 Spinal stenosis, lumbar region with neurogenic claudication: Secondary | ICD-10-CM | POA: Diagnosis not present

## 2020-07-14 DIAGNOSIS — I5042 Chronic combined systolic (congestive) and diastolic (congestive) heart failure: Secondary | ICD-10-CM | POA: Diagnosis not present

## 2020-07-14 DIAGNOSIS — M961 Postlaminectomy syndrome, not elsewhere classified: Secondary | ICD-10-CM | POA: Diagnosis not present

## 2020-07-14 DIAGNOSIS — N183 Chronic kidney disease, stage 3 unspecified: Secondary | ICD-10-CM | POA: Diagnosis not present

## 2020-07-14 DIAGNOSIS — G894 Chronic pain syndrome: Secondary | ICD-10-CM | POA: Diagnosis not present

## 2020-07-14 DIAGNOSIS — I130 Hypertensive heart and chronic kidney disease with heart failure and stage 1 through stage 4 chronic kidney disease, or unspecified chronic kidney disease: Secondary | ICD-10-CM | POA: Diagnosis not present

## 2020-07-16 DIAGNOSIS — N1832 Chronic kidney disease, stage 3b: Secondary | ICD-10-CM | POA: Diagnosis not present

## 2020-07-16 DIAGNOSIS — E211 Secondary hyperparathyroidism, not elsewhere classified: Secondary | ICD-10-CM | POA: Diagnosis not present

## 2020-07-16 DIAGNOSIS — I5032 Chronic diastolic (congestive) heart failure: Secondary | ICD-10-CM | POA: Diagnosis not present

## 2020-07-16 DIAGNOSIS — I129 Hypertensive chronic kidney disease with stage 1 through stage 4 chronic kidney disease, or unspecified chronic kidney disease: Secondary | ICD-10-CM | POA: Diagnosis not present

## 2020-07-19 DIAGNOSIS — F54 Psychological and behavioral factors associated with disorders or diseases classified elsewhere: Secondary | ICD-10-CM | POA: Diagnosis not present

## 2020-07-19 DIAGNOSIS — M533 Sacrococcygeal disorders, not elsewhere classified: Secondary | ICD-10-CM | POA: Diagnosis not present

## 2020-07-19 DIAGNOSIS — M5416 Radiculopathy, lumbar region: Secondary | ICD-10-CM | POA: Diagnosis not present

## 2020-07-21 DIAGNOSIS — I5042 Chronic combined systolic (congestive) and diastolic (congestive) heart failure: Secondary | ICD-10-CM | POA: Diagnosis not present

## 2020-07-21 DIAGNOSIS — I130 Hypertensive heart and chronic kidney disease with heart failure and stage 1 through stage 4 chronic kidney disease, or unspecified chronic kidney disease: Secondary | ICD-10-CM | POA: Diagnosis not present

## 2020-07-21 DIAGNOSIS — N183 Chronic kidney disease, stage 3 unspecified: Secondary | ICD-10-CM | POA: Diagnosis not present

## 2020-07-21 DIAGNOSIS — M48062 Spinal stenosis, lumbar region with neurogenic claudication: Secondary | ICD-10-CM | POA: Diagnosis not present

## 2020-07-21 DIAGNOSIS — M961 Postlaminectomy syndrome, not elsewhere classified: Secondary | ICD-10-CM | POA: Diagnosis not present

## 2020-07-21 DIAGNOSIS — G894 Chronic pain syndrome: Secondary | ICD-10-CM | POA: Diagnosis not present

## 2020-07-22 ENCOUNTER — Other Ambulatory Visit: Payer: Self-pay | Admitting: Rheumatology

## 2020-07-22 DIAGNOSIS — Z79899 Other long term (current) drug therapy: Secondary | ICD-10-CM

## 2020-07-22 NOTE — Telephone Encounter (Signed)
Last visit: 06/07/2020 Next Visit: 09/07/2020 Labs: 03/16/2020 WBC 12.1, Neutrophils (Absolute) 9.1 BUN 51, Creat. 1.53. GFR 39, Chloride 94, Alk. Phos. 155  Current Dose per office note on 06/07/2020: Prednisone 10 mg daily and Arava 10 mg daily  Patient advised she is due to update labs. Faxed order to [facility] Nurse to update.  Okay to refill Prednisone and Arava?

## 2020-07-26 DIAGNOSIS — M533 Sacrococcygeal disorders, not elsewhere classified: Secondary | ICD-10-CM | POA: Diagnosis not present

## 2020-07-26 DIAGNOSIS — M5416 Radiculopathy, lumbar region: Secondary | ICD-10-CM | POA: Diagnosis not present

## 2020-07-26 DIAGNOSIS — F54 Psychological and behavioral factors associated with disorders or diseases classified elsewhere: Secondary | ICD-10-CM | POA: Diagnosis not present

## 2020-07-26 DIAGNOSIS — M48061 Spinal stenosis, lumbar region without neurogenic claudication: Secondary | ICD-10-CM | POA: Diagnosis not present

## 2020-07-27 DIAGNOSIS — Z79899 Other long term (current) drug therapy: Secondary | ICD-10-CM | POA: Diagnosis not present

## 2020-07-27 DIAGNOSIS — I130 Hypertensive heart and chronic kidney disease with heart failure and stage 1 through stage 4 chronic kidney disease, or unspecified chronic kidney disease: Secondary | ICD-10-CM | POA: Diagnosis not present

## 2020-07-27 DIAGNOSIS — M48062 Spinal stenosis, lumbar region with neurogenic claudication: Secondary | ICD-10-CM | POA: Diagnosis not present

## 2020-07-27 DIAGNOSIS — I5042 Chronic combined systolic (congestive) and diastolic (congestive) heart failure: Secondary | ICD-10-CM | POA: Diagnosis not present

## 2020-07-27 DIAGNOSIS — N183 Chronic kidney disease, stage 3 unspecified: Secondary | ICD-10-CM | POA: Diagnosis not present

## 2020-07-27 DIAGNOSIS — G894 Chronic pain syndrome: Secondary | ICD-10-CM | POA: Diagnosis not present

## 2020-07-27 DIAGNOSIS — M961 Postlaminectomy syndrome, not elsewhere classified: Secondary | ICD-10-CM | POA: Diagnosis not present

## 2020-07-28 DIAGNOSIS — G894 Chronic pain syndrome: Secondary | ICD-10-CM | POA: Diagnosis not present

## 2020-07-28 DIAGNOSIS — I5042 Chronic combined systolic (congestive) and diastolic (congestive) heart failure: Secondary | ICD-10-CM | POA: Diagnosis not present

## 2020-07-28 DIAGNOSIS — M48062 Spinal stenosis, lumbar region with neurogenic claudication: Secondary | ICD-10-CM | POA: Diagnosis not present

## 2020-07-28 DIAGNOSIS — M961 Postlaminectomy syndrome, not elsewhere classified: Secondary | ICD-10-CM | POA: Diagnosis not present

## 2020-07-28 DIAGNOSIS — N183 Chronic kidney disease, stage 3 unspecified: Secondary | ICD-10-CM | POA: Diagnosis not present

## 2020-07-28 DIAGNOSIS — I130 Hypertensive heart and chronic kidney disease with heart failure and stage 1 through stage 4 chronic kidney disease, or unspecified chronic kidney disease: Secondary | ICD-10-CM | POA: Diagnosis not present

## 2020-07-29 ENCOUNTER — Telehealth: Payer: Self-pay | Admitting: *Deleted

## 2020-07-29 NOTE — Telephone Encounter (Signed)
Labs sent by [facility]. Drawn on 07/27/2020 Reviewed by Hazel Sams, PA-C  CBC/CMP  Glucose 129 BUN 34 Creatinine 1.26 GFR 48 Calcium 8.5 Alk Phos 133  Patient on Arava 10 mg daily and Prednisone 10 mg daily.

## 2020-08-05 DIAGNOSIS — M961 Postlaminectomy syndrome, not elsewhere classified: Secondary | ICD-10-CM | POA: Diagnosis not present

## 2020-08-05 DIAGNOSIS — M069 Rheumatoid arthritis, unspecified: Secondary | ICD-10-CM | POA: Diagnosis not present

## 2020-08-05 DIAGNOSIS — I10 Essential (primary) hypertension: Secondary | ICD-10-CM | POA: Diagnosis not present

## 2020-08-05 DIAGNOSIS — R7309 Other abnormal glucose: Secondary | ICD-10-CM | POA: Diagnosis not present

## 2020-08-05 DIAGNOSIS — M48061 Spinal stenosis, lumbar region without neurogenic claudication: Secondary | ICD-10-CM | POA: Diagnosis not present

## 2020-08-05 DIAGNOSIS — G894 Chronic pain syndrome: Secondary | ICD-10-CM | POA: Diagnosis not present

## 2020-08-05 DIAGNOSIS — I5042 Chronic combined systolic (congestive) and diastolic (congestive) heart failure: Secondary | ICD-10-CM | POA: Diagnosis not present

## 2020-08-05 DIAGNOSIS — N183 Chronic kidney disease, stage 3 unspecified: Secondary | ICD-10-CM | POA: Diagnosis not present

## 2020-08-05 DIAGNOSIS — I130 Hypertensive heart and chronic kidney disease with heart failure and stage 1 through stage 4 chronic kidney disease, or unspecified chronic kidney disease: Secondary | ICD-10-CM | POA: Diagnosis not present

## 2020-08-05 DIAGNOSIS — G8929 Other chronic pain: Secondary | ICD-10-CM | POA: Diagnosis not present

## 2020-08-05 DIAGNOSIS — I509 Heart failure, unspecified: Secondary | ICD-10-CM | POA: Diagnosis not present

## 2020-08-05 DIAGNOSIS — M48062 Spinal stenosis, lumbar region with neurogenic claudication: Secondary | ICD-10-CM | POA: Diagnosis not present

## 2020-08-05 DIAGNOSIS — Z6841 Body Mass Index (BMI) 40.0 and over, adult: Secondary | ICD-10-CM | POA: Diagnosis not present

## 2020-08-05 DIAGNOSIS — Z23 Encounter for immunization: Secondary | ICD-10-CM | POA: Diagnosis not present

## 2020-08-06 DIAGNOSIS — M6281 Muscle weakness (generalized): Secondary | ICD-10-CM | POA: Diagnosis not present

## 2020-08-06 DIAGNOSIS — N183 Chronic kidney disease, stage 3 unspecified: Secondary | ICD-10-CM | POA: Diagnosis not present

## 2020-08-06 DIAGNOSIS — M48062 Spinal stenosis, lumbar region with neurogenic claudication: Secondary | ICD-10-CM | POA: Diagnosis not present

## 2020-08-06 DIAGNOSIS — I5042 Chronic combined systolic (congestive) and diastolic (congestive) heart failure: Secondary | ICD-10-CM | POA: Diagnosis not present

## 2020-08-06 DIAGNOSIS — Z6841 Body Mass Index (BMI) 40.0 and over, adult: Secondary | ICD-10-CM | POA: Diagnosis not present

## 2020-08-06 DIAGNOSIS — R262 Difficulty in walking, not elsewhere classified: Secondary | ICD-10-CM | POA: Diagnosis not present

## 2020-08-06 DIAGNOSIS — I130 Hypertensive heart and chronic kidney disease with heart failure and stage 1 through stage 4 chronic kidney disease, or unspecified chronic kidney disease: Secondary | ICD-10-CM | POA: Diagnosis not present

## 2020-08-06 DIAGNOSIS — M961 Postlaminectomy syndrome, not elsewhere classified: Secondary | ICD-10-CM | POA: Diagnosis not present

## 2020-08-06 DIAGNOSIS — G894 Chronic pain syndrome: Secondary | ICD-10-CM | POA: Diagnosis not present

## 2020-08-06 DIAGNOSIS — M353 Polymyalgia rheumatica: Secondary | ICD-10-CM | POA: Diagnosis not present

## 2020-08-11 DIAGNOSIS — N183 Chronic kidney disease, stage 3 unspecified: Secondary | ICD-10-CM | POA: Diagnosis not present

## 2020-08-11 DIAGNOSIS — I5042 Chronic combined systolic (congestive) and diastolic (congestive) heart failure: Secondary | ICD-10-CM | POA: Diagnosis not present

## 2020-08-11 DIAGNOSIS — M961 Postlaminectomy syndrome, not elsewhere classified: Secondary | ICD-10-CM | POA: Diagnosis not present

## 2020-08-11 DIAGNOSIS — M48062 Spinal stenosis, lumbar region with neurogenic claudication: Secondary | ICD-10-CM | POA: Diagnosis not present

## 2020-08-11 DIAGNOSIS — I130 Hypertensive heart and chronic kidney disease with heart failure and stage 1 through stage 4 chronic kidney disease, or unspecified chronic kidney disease: Secondary | ICD-10-CM | POA: Diagnosis not present

## 2020-08-11 DIAGNOSIS — G894 Chronic pain syndrome: Secondary | ICD-10-CM | POA: Diagnosis not present

## 2020-08-12 DIAGNOSIS — K219 Gastro-esophageal reflux disease without esophagitis: Secondary | ICD-10-CM | POA: Diagnosis not present

## 2020-08-12 DIAGNOSIS — M069 Rheumatoid arthritis, unspecified: Secondary | ICD-10-CM | POA: Diagnosis not present

## 2020-08-12 DIAGNOSIS — K625 Hemorrhage of anus and rectum: Secondary | ICD-10-CM | POA: Diagnosis not present

## 2020-08-12 DIAGNOSIS — I10 Essential (primary) hypertension: Secondary | ICD-10-CM | POA: Diagnosis not present

## 2020-08-12 DIAGNOSIS — K5730 Diverticulosis of large intestine without perforation or abscess without bleeding: Secondary | ICD-10-CM | POA: Diagnosis not present

## 2020-08-12 DIAGNOSIS — M353 Polymyalgia rheumatica: Secondary | ICD-10-CM | POA: Diagnosis not present

## 2020-08-12 DIAGNOSIS — M48061 Spinal stenosis, lumbar region without neurogenic claudication: Secondary | ICD-10-CM | POA: Diagnosis not present

## 2020-08-12 DIAGNOSIS — I5032 Chronic diastolic (congestive) heart failure: Secondary | ICD-10-CM | POA: Diagnosis not present

## 2020-08-12 DIAGNOSIS — Z6841 Body Mass Index (BMI) 40.0 and over, adult: Secondary | ICD-10-CM | POA: Diagnosis not present

## 2020-08-17 DIAGNOSIS — M48062 Spinal stenosis, lumbar region with neurogenic claudication: Secondary | ICD-10-CM | POA: Diagnosis not present

## 2020-08-17 DIAGNOSIS — N183 Chronic kidney disease, stage 3 unspecified: Secondary | ICD-10-CM | POA: Diagnosis not present

## 2020-08-17 DIAGNOSIS — M961 Postlaminectomy syndrome, not elsewhere classified: Secondary | ICD-10-CM | POA: Diagnosis not present

## 2020-08-17 DIAGNOSIS — I130 Hypertensive heart and chronic kidney disease with heart failure and stage 1 through stage 4 chronic kidney disease, or unspecified chronic kidney disease: Secondary | ICD-10-CM | POA: Diagnosis not present

## 2020-08-17 DIAGNOSIS — I5042 Chronic combined systolic (congestive) and diastolic (congestive) heart failure: Secondary | ICD-10-CM | POA: Diagnosis not present

## 2020-08-17 DIAGNOSIS — G894 Chronic pain syndrome: Secondary | ICD-10-CM | POA: Diagnosis not present

## 2020-08-23 ENCOUNTER — Other Ambulatory Visit: Payer: Self-pay | Admitting: Rheumatology

## 2020-08-23 DIAGNOSIS — M961 Postlaminectomy syndrome, not elsewhere classified: Secondary | ICD-10-CM | POA: Diagnosis not present

## 2020-08-23 DIAGNOSIS — M48062 Spinal stenosis, lumbar region with neurogenic claudication: Secondary | ICD-10-CM | POA: Diagnosis not present

## 2020-08-23 DIAGNOSIS — I130 Hypertensive heart and chronic kidney disease with heart failure and stage 1 through stage 4 chronic kidney disease, or unspecified chronic kidney disease: Secondary | ICD-10-CM | POA: Diagnosis not present

## 2020-08-23 DIAGNOSIS — G894 Chronic pain syndrome: Secondary | ICD-10-CM | POA: Diagnosis not present

## 2020-08-23 NOTE — Telephone Encounter (Signed)
Last visit: 06/07/2020 Next Visit: 09/07/2020 Labs: 07/27/2020 Glucose 129, BUN 34, Creatinine 1.26, GFR 48, Calcium 8.5, Alk Phos 133  Current Dose per office note on 06/07/2020:Prednisone 10 mg daily and Arava 10 mg daily  Okay to refill Arava and Prednisone?

## 2020-08-24 DIAGNOSIS — G894 Chronic pain syndrome: Secondary | ICD-10-CM | POA: Diagnosis not present

## 2020-08-24 DIAGNOSIS — M961 Postlaminectomy syndrome, not elsewhere classified: Secondary | ICD-10-CM | POA: Diagnosis not present

## 2020-08-24 DIAGNOSIS — M48062 Spinal stenosis, lumbar region with neurogenic claudication: Secondary | ICD-10-CM | POA: Diagnosis not present

## 2020-08-24 DIAGNOSIS — I130 Hypertensive heart and chronic kidney disease with heart failure and stage 1 through stage 4 chronic kidney disease, or unspecified chronic kidney disease: Secondary | ICD-10-CM | POA: Diagnosis not present

## 2020-08-24 DIAGNOSIS — I5042 Chronic combined systolic (congestive) and diastolic (congestive) heart failure: Secondary | ICD-10-CM | POA: Diagnosis not present

## 2020-08-24 DIAGNOSIS — N183 Chronic kidney disease, stage 3 unspecified: Secondary | ICD-10-CM | POA: Diagnosis not present

## 2020-08-24 NOTE — Progress Notes (Deleted)
Office Visit Note  Patient: Nicole [name]             Date of Birth: Jul 29, 1946           MRN: [id number]             PCP: Sharilyn Sites, MD Referring: Sharilyn Sites, MD Visit Date: 09/07/2020 Occupation: @GUAROCC @  Subjective:  No chief complaint on file.   History of Present Illness: Nicole [name] is a 74 y.o. female ***   Activities of Daily Living:  Patient reports morning stiffness for *** {minute/hour:19697}.   Patient {ACTIONS;DENIES/REPORTS:21021675::"Denies"} nocturnal pain.  Difficulty dressing/grooming: {ACTIONS;DENIES/REPORTS:21021675::"Denies"} Difficulty climbing stairs: {ACTIONS;DENIES/REPORTS:21021675::"Denies"} Difficulty getting out of chair: {ACTIONS;DENIES/REPORTS:21021675::"Denies"} Difficulty using hands for taps, buttons, cutlery, and/or writing: {ACTIONS;DENIES/REPORTS:21021675::"Denies"}  No Rheumatology ROS completed.   PMFS History:  Patient Active Problem List   Diagnosis Date Noted  . Diverticulitis 05/02/2018  . Diverticulitis of colon 05/01/2018  . Morbid obesity with BMI of 50.0-59.9, adult ([place]) 05/01/2018  . Chronic diastolic HF (heart failure) ([place]) 05/01/2018  . On prednisone therapy 05/31/2017  . Positive anti-CCP test 05/31/2017  . Rheumatoid factor positive 05/31/2017  . Memory loss 04/04/2017  . Polymyalgia rheumatica ([place]) 04/04/2017  . High risk medication use 12/07/2016  . DDD (degenerative disc disease), lumbar 09/27/2016  . HTN (hypertension) 06/05/2016  . Chronic pain syndrome 06/05/2016  . Acute diverticulitis 06/02/2016  . Diverticulitis large intestine 06/02/2016  . Spinal stenosis of lumbosacral region 02/29/2016  . Joint pain 02/29/2016  . Endometrial polyp 02/06/2014  . Postmenopausal bleeding 01/30/2014  . GERD (gastroesophageal reflux disease) 09/29/2013  . Spinal stenosis, thoracic 09/29/2013  . Shingles 09/29/2013  . Thoracic or lumbosacral neuritis or radiculitis, unspecified 05/04/2011  . Abnormality of gait  05/04/2011  . Muscle weakness (generalized) 05/04/2011  . Bilateral primary osteoarthritis of knee 04/07/2009  . KNEE PAIN 04/07/2009    Past Medical History:  Diagnosis Date  . AC (acromioclavicular) joint bone spurs    lt shoulder  . Anemia   . Arthritis   . Carpal tunnel syndrome, bilateral   . CHF (congestive heart failure) ([place])   . Collagen vascular disease ([place])   . Gastroesophageal reflux   . Headache    recent visit to ER @ [facility] for severe headache  . Hypertension   . Lumbar stenosis    Hx of ESIs by Dr. Nelva Bush  . Polyarthralgia   . Polymyalgia ([place])   . Shingles   . Spinal stenosis     Family History  Problem Relation Age of Onset  . Hypertension Mother   . Pneumonia Father   . Arthritis Sister   . Diabetes Paternal Grandmother    Past Surgical History:  Procedure Laterality Date  . BACK SURGERY  07/05/2018, 07/2018   x2   . CHOLECYSTECTOMY    . COLONOSCOPY  06/11/2012   Procedure: COLONOSCOPY;  Surgeon: Jamesetta So, MD;  [facility];  Service: Gastroenterology;  Laterality: N/A;  . HYSTEROSCOPY WITH D & C N/A 02/25/2014   Procedure: DILATATION AND CURETTAGE /HYSTEROSCOPY;  Surgeon: Florian Buff, MD;  Location: [facility];  Service: Gynecology;  Laterality: N/A;  . POLYPECTOMY N/A 02/25/2014   Procedure: POLYPECTOMY;  Surgeon: Florian Buff, MD;  Location: [facility];  Service: Gynecology;  Laterality: N/A;  . RESECTION DISTAL CLAVICAL Right 03/26/2015   Procedure: OPEN DISTAL CLAVICAL RESECTION ;  Surgeon: Netta Cedars, MD;  Location: [place];  Service: Orthopedics;  Laterality: Right;   Social History  Social History Narrative   Lives at home alone.   Right-handed.   1-2 cups caffeine daily.   Immunization History  Administered Date(s) Administered  . Influenza,inj,quad, With Preservative 07/23/2017  . Unspecified SARS-COV-2 Vaccination 06/02/2020     Objective: Vital Signs: There were no vitals taken for this visit.   Physical Exam    Musculoskeletal Exam: ***  CDAI Exam: CDAI Score: -- Patient Global: --; Provider Global: -- Swollen: --; Tender: -- Joint Exam 09/07/2020   No joint exam has been documented for this visit   There is currently no information documented on the homunculus. Go to the Rheumatology activity and complete the homunculus joint exam.  Investigation: No additional findings.  Imaging: No results found.  Recent Labs: Lab Results  Component Value Date   WBC 11.3 (H) 10/30/2019   HGB 12.7 10/30/2019   PLT 359 10/30/2019   NA 139 10/30/2019   K 4.6 10/30/2019   CL 99 10/30/2019   CO2 24 10/30/2019   GLUCOSE 94 10/30/2019   BUN 14 10/30/2019   CREATININE 1.40 (H) 10/30/2019   BILITOT 0.3 10/30/2019   ALKPHOS 158 (H) 10/30/2019   AST 36 10/30/2019   ALT 36 (H) 10/30/2019   PROT 7.1 10/30/2019   ALBUMIN 4.1 10/30/2019   CALCIUM 9.6 10/30/2019   GFRAA 43 (L) 10/30/2019    Speciality Comments: PLQ Eye Exam: 12/13/17 WNl @ [facility]  Procedures:  No procedures performed Allergies: Oxycontin [oxycodone hcl], Sulfa antibiotics, Tramadol, Penicillins, and Sulfasalazine   Assessment / Plan:     Visit Diagnoses: Polymyalgia rheumatica ([place])  Temporal arteritis (HCC)  Rheumatoid arthritis involving multiple sites with positive rheumatoid factor ([place])  High risk medication use - Actemra-consent at last visit, arava 10 mg daily, and prednisone 10 mg daily  On prednisone therapy  Chronic pain syndrome  Primary osteoarthritis of both knees  DDD (degenerative disc disease), lumbar  Spinal stenosis, thoracic  Vitamin D deficiency  History of diverticulitis  History of hypertension  History of gastroesophageal reflux (GERD)  Memory loss  Orders: No orders of the defined types were placed in this encounter.  No orders of the defined types were placed in this encounter.   Face-to-face time spent with patient was *** minutes. Greater than 50% of time was spent  in counseling and coordination of care.  Follow-Up Instructions: No follow-ups on file.   Ofilia Neas, PA-C  Note - This record has been created using Dragon software.  Chart creation errors have been sought, but may not always  have been located. Such creation errors do not reflect on  the standard of medical care.

## 2020-08-26 ENCOUNTER — Ambulatory Visit (INDEPENDENT_AMBULATORY_CARE_PROVIDER_SITE_OTHER): Payer: TRICARE For Life (TFL) | Admitting: Gastroenterology

## 2020-08-26 NOTE — Progress Notes (Deleted)
Patient profile: Nicole [name] is a 74 y.o. female seen for evaluation of rectal bleeding.  Last seen 2019 for rectal bleeding.  History of Present Illness: Nicole [name] is seen today for   Wt Readings from Last 3 Encounters:  06/07/20 239 lb 9.6 oz (108.7 kg)  04/15/20 243 lb 6.4 oz (110.4 kg)  03/30/20 241 lb (109.3 kg)     Last Colonoscopy: 2013-screening colonoscopy-diverticulosis descending. No polyps   Last Endoscopy:    Past Medical History:  Past Medical History:  Diagnosis Date  . AC (acromioclavicular) joint bone spurs    lt shoulder  . Anemia   . Arthritis   . Carpal tunnel syndrome, bilateral   . CHF (congestive heart failure) ([place])   . Collagen vascular disease ([place])   . Gastroesophageal reflux   . Headache    recent visit to ER @ [facility] for severe headache  . Hypertension   . Lumbar stenosis    Hx of ESIs by Dr. Nelva Bush  . Polyarthralgia   . Polymyalgia ([place])   . Shingles   . Spinal stenosis     Problem List: Patient Active Problem List   Diagnosis Date Noted  . Diverticulitis 05/02/2018  . Diverticulitis of colon 05/01/2018  . Morbid obesity with BMI of 50.0-59.9, adult ([place]) 05/01/2018  . Chronic diastolic HF (heart failure) ([place]) 05/01/2018  . On prednisone therapy 05/31/2017  . Positive anti-CCP test 05/31/2017  . Rheumatoid factor positive 05/31/2017  . Memory loss 04/04/2017  . Polymyalgia rheumatica ([place]) 04/04/2017  . High risk medication use 12/07/2016  . DDD (degenerative disc disease), lumbar 09/27/2016  . HTN (hypertension) 06/05/2016  . Chronic pain syndrome 06/05/2016  . Acute diverticulitis 06/02/2016  . Diverticulitis large intestine 06/02/2016  . Spinal stenosis of lumbosacral region 02/29/2016  . Joint pain 02/29/2016  . Endometrial polyp 02/06/2014  . Postmenopausal bleeding 01/30/2014  . GERD (gastroesophageal reflux disease) 09/29/2013  . Spinal stenosis, thoracic 09/29/2013  . Shingles 09/29/2013  . Thoracic or  lumbosacral neuritis or radiculitis, unspecified 05/04/2011  . Abnormality of gait 05/04/2011  . Muscle weakness (generalized) 05/04/2011  . Bilateral primary osteoarthritis of knee 04/07/2009  . KNEE PAIN 04/07/2009    Past Surgical History: Past Surgical History:  Procedure Laterality Date  . BACK SURGERY  07/05/2018, 07/2018   x2   . CHOLECYSTECTOMY    . COLONOSCOPY  06/11/2012   Procedure: COLONOSCOPY;  Surgeon: Jamesetta So, MD;  [facility];  Service: Gastroenterology;  Laterality: N/A;  . HYSTEROSCOPY WITH D & C N/A 02/25/2014   Procedure: DILATATION AND CURETTAGE /HYSTEROSCOPY;  Surgeon: Florian Buff, MD;  [facility];  Service: Gynecology;  Laterality: N/A;  . POLYPECTOMY N/A 02/25/2014   Procedure: POLYPECTOMY;  Surgeon: Florian Buff, MD;  [facility];  Service: Gynecology;  Laterality: N/A;  . RESECTION DISTAL CLAVICAL Right 03/26/2015   Procedure: OPEN DISTAL CLAVICAL RESECTION ;  Surgeon: Netta Cedars, MD;  Location: [place];  Service: Orthopedics;  Laterality: Right;    Allergies: Allergies  Allergen Reactions  . Oxycontin [Oxycodone Hcl] Swelling    Pt tolerates hydromorphone.  . Sulfa Antibiotics Other (See Comments)    Sores  . Tramadol     "Makes me feel like I am falling"  . Penicillins Rash  . Sulfasalazine Other (See Comments)    Sores      Home Medications:  Current Outpatient Medications:  .  acetaminophen (TYLENOL) 325 MG tablet, Take 2 tablets (650  mg total) by mouth every 6 (six) hours as needed for mild pain or headache (or Fever >/= 101)., Disp: , Rfl:  .  Ascorbic Acid (VITAMIN C PO), Take by mouth daily., Disp: , Rfl:  .  calcitRIOL (ROCALTROL) 0.25 MCG capsule, TAKE ONE CAPSULE BY MOUTH THREE TIMES PER WEEK ON MONDAY, WEDNESDAY AND FRIDAY, Disp: , Rfl:  .  clotrimazole-betamethasone (LOTRISONE) cream, , Disp: , Rfl:  .  cyclobenzaprine (FLEXERIL) 5 MG tablet, SMARTSIG:1 Tablet(s) By Mouth Every 12 Hours PRN, Disp: , Rfl:  .   diclofenac (VOLTAREN) 75 MG EC tablet, 2 (two) times daily., Disp: , Rfl:  .  diclofenac sodium (VOLTAREN) 1 % GEL, as needed. , Disp: , Rfl:  .  docusate sodium (COLACE) 100 MG capsule, Take 100 mg by mouth as needed. , Disp: , Rfl:  .  gabapentin (NEURONTIN) 400 MG capsule, 3 (three) times daily., Disp: , Rfl:  .  leflunomide (ARAVA) 10 MG tablet, TAKE (1) TABLET BY MOUTH DAILY., Disp: 30 tablet, Rfl: 0 .  lidocaine (LIDODERM) 5 %, SMARTSIG:1-2 Patch(s) Topical Every 12 Hours PRN, Disp: , Rfl:  .  Multiple Vitamins-Minerals (MULTIVITAMIN PO), Take by mouth daily., Disp: , Rfl:  .  mupirocin ointment (BACTROBAN) 2 %, Apply 1 application topically 2 (two) times daily., Disp: 22 g, Rfl: 0 .  nystatin (MYCOSTATIN/NYSTOP) powder, nystatin 100,000 unit/gram topical powder, Disp: , Rfl:  .  pantoprazole (PROTONIX) 40 MG tablet, Take 40 mg by mouth as needed. , Disp: , Rfl:  .  potassium chloride (MICRO-K) 10 MEQ CR capsule, Take 10 mEq by mouth daily., Disp: , Rfl:  .  predniSONE (DELTASONE) 5 MG tablet, TAKE 2 TABLETS BY MOUTH DAILY WITH BREAKFAST., Disp: 60 tablet, Rfl: 0 .  Simethicone (GAS-X PO), Take by mouth as needed., Disp: , Rfl:  .  Tapentadol HCl (NUCYNTA ER) 200 MG TB12, Take by mouth every 12 (twelve) hours., Disp: , Rfl:  .  torsemide (DEMADEX) 20 MG tablet, Take 40 mg by mouth in the morning, at noon, and at bedtime. Only did 2 pills once a day the last few days., Disp: , Rfl:  .  VITAMIN K PO, Take by mouth daily., Disp: , Rfl:    Family History: family history includes Arthritis in her sister; Diabetes in her paternal grandmother; Hypertension in her mother; Pneumonia in her father.    Social History:   reports that she has never smoked. She has never used smokeless tobacco. She reports that she does not drink alcohol and does not use drugs.   Review of Systems: Constitutional: Denies weight loss/weight gain  Eyes: No changes in vision. ENT: No oral lesions, sore throat.  GI:  see HPI.  Heme/Lymph: No easy bruising.  CV: No chest pain.  GU: No hematuria.  Integumentary: No rashes.  Neuro: No headaches.  Psych: No depression/anxiety.  Endocrine: No heat/cold intolerance.  Allergic/Immunologic: No urticaria.  Resp: No cough, SOB.  Musculoskeletal: No joint swelling.    Physical Examination: There were no vitals taken for this visit. Gen: NAD, alert and oriented x 4 HEENT: PEERLA, EOMI, Neck: supple, no JVD Chest: CTA bilaterally, no wheezes, crackles, or other adventitious sounds CV: RRR, no m/g/c/r Abd: soft, NT, ND, +BS in all four quadrants; no HSM, guarding, ridigity, or rebound tenderness Ext: no edema, well perfused with 2+ pulses, Skin: no rash or lesions noted on observed skin Lymph: no noted LAD  Data Reviewed:     Assessment/Plan: Ms. Quijas is a  74 y.o. female    There are no diagnoses linked to this encounter.   Recommendations:   I personally performed the service, non-incident to. (WP)  Laurine Blazer, [facility] for [facility]astro[facility]

## 2020-08-30 ENCOUNTER — Other Ambulatory Visit (INDEPENDENT_AMBULATORY_CARE_PROVIDER_SITE_OTHER): Payer: Self-pay | Admitting: *Deleted

## 2020-08-30 ENCOUNTER — Other Ambulatory Visit: Payer: Self-pay

## 2020-08-30 ENCOUNTER — Encounter (INDEPENDENT_AMBULATORY_CARE_PROVIDER_SITE_OTHER): Payer: Self-pay | Admitting: Gastroenterology

## 2020-08-30 ENCOUNTER — Telehealth (INDEPENDENT_AMBULATORY_CARE_PROVIDER_SITE_OTHER): Payer: Self-pay | Admitting: *Deleted

## 2020-08-30 ENCOUNTER — Encounter (INDEPENDENT_AMBULATORY_CARE_PROVIDER_SITE_OTHER): Payer: Self-pay | Admitting: *Deleted

## 2020-08-30 ENCOUNTER — Ambulatory Visit (INDEPENDENT_AMBULATORY_CARE_PROVIDER_SITE_OTHER): Payer: Medicare Other | Admitting: Gastroenterology

## 2020-08-30 VITALS — BP 139/70 | HR 99 | Temp 98.7°F | Ht 62.0 in | Wt 240.6 lb

## 2020-08-30 DIAGNOSIS — K625 Hemorrhage of anus and rectum: Secondary | ICD-10-CM | POA: Diagnosis not present

## 2020-08-30 DIAGNOSIS — K5732 Diverticulitis of large intestine without perforation or abscess without bleeding: Secondary | ICD-10-CM | POA: Diagnosis not present

## 2020-08-30 DIAGNOSIS — K219 Gastro-esophageal reflux disease without esophagitis: Secondary | ICD-10-CM

## 2020-08-30 MED ORDER — PLENVU 140 G PO SOLR: 1.0000 | Freq: Once | ORAL | 0 refills | Status: AC

## 2020-08-30 NOTE — Progress Notes (Signed)
Patient profile: Nicole [name] is a 74 y.o. female seen for follow up of rectal bleeding. Last seen 04/2018 for rectal bleeding.   History of Present Illness: Nicole [name] is seen today for evaluation of rectal bleeding.  Patient reports in October 2021 waking up with significant issues with rectal bleeding, this occurred about 3 days and had mild lower abdominal pain associated with rectal bleeding.  She was seen by her primary care and diagnosed with possible diverticulitis and hemorrhoids.  She was started on 10 days of Cipro and Flagyl.  The abdominal pain and the bleeding both resolved and have not recurred.  She had a prior episode in 2019 of diverticulitis w/ bleeding requiring hospitalization.  She currently feels like she is back to normal bowel habits with a stool most days.  She is no longer needing stool softener.  Typically has bowel movement every morning.  She has chronic back pain which often radiates to her abdomen especially on the right side.  She is on Protonix twice a day still gets occasional GERD symptoms requiring Gas-X or Tums as needed.  She denies any nausea vomiting or dysphagia.  Appetite is good.  She takes diclofenac with food.  Lives alone but has family close by that check on her often. No nsaids, tobacco, alcohol. Has a lot of mobility issues since prior back surgeries. Family member accompanies today and helps w/ hx.   Wt Readings from Last 3 Encounters:  08/30/20 240 lb 9.6 oz (109.1 kg)  06/07/20 239 lb 9.6 oz (108.7 kg)  04/15/20 243 lb 6.4 oz (110.4 kg)    Last Colonoscopy: 2013 by Dr. Arnoldo Morale (screening) Normal.  Diverticulosis in descending colon.     Past Medical History:  Past Medical History:  Diagnosis Date  . AC (acromioclavicular) joint bone spurs    lt shoulder  . Anemia   . Arthritis   . Carpal tunnel syndrome, bilateral   . CHF (congestive heart failure) ([place])   . Collagen vascular disease ([place])   . Gastroesophageal reflux   .  Headache    recent visit to ER @ [facility] for severe headache  . Hypertension   . Lumbar stenosis    Hx of ESIs by Dr. Nelva Bush  . Polyarthralgia   . Polymyalgia ([place])   . Shingles   . Spinal stenosis     Problem List: Patient Active Problem List   Diagnosis Date Noted  . Diverticulitis 05/02/2018  . Diverticulitis of colon 05/01/2018  . Morbid obesity with BMI of 50.0-59.9, adult ([place]) 05/01/2018  . Chronic diastolic HF (heart failure) ([place]) 05/01/2018  . On prednisone therapy 05/31/2017  . Positive anti-CCP test 05/31/2017  . Rheumatoid factor positive 05/31/2017  . Memory loss 04/04/2017  . Polymyalgia rheumatica ([place]) 04/04/2017  . High risk medication use 12/07/2016  . DDD (degenerative disc disease), lumbar 09/27/2016  . HTN (hypertension) 06/05/2016  . Chronic pain syndrome 06/05/2016  . Acute diverticulitis 06/02/2016  . Diverticulitis large intestine 06/02/2016  . Spinal stenosis of lumbosacral region 02/29/2016  . Joint pain 02/29/2016  . Endometrial polyp 02/06/2014  . Postmenopausal bleeding 01/30/2014  . GERD (gastroesophageal reflux disease) 09/29/2013  . Spinal stenosis, thoracic 09/29/2013  . Shingles 09/29/2013  . Thoracic or lumbosacral neuritis or radiculitis, unspecified 05/04/2011  . Abnormality of gait 05/04/2011  . Muscle weakness (generalized) 05/04/2011  . Bilateral primary osteoarthritis of knee 04/07/2009  . KNEE PAIN 04/07/2009    Past Surgical History: Past Surgical History:  Procedure Laterality  Date  . BACK SURGERY  07/05/2018, 07/2018   x2   . CHOLECYSTECTOMY    . COLONOSCOPY  06/11/2012   Procedure: COLONOSCOPY;  Surgeon: Jamesetta So, MD;  [facility];  Service: Gastroenterology;  Laterality: N/A;  . HYSTEROSCOPY WITH D & C N/A 02/25/2014   Procedure: DILATATION AND CURETTAGE /HYSTEROSCOPY;  Surgeon: Florian Buff, MD;  [facility];  Service: Gynecology;  Laterality: N/A;  . POLYPECTOMY N/A 02/25/2014   Procedure:  POLYPECTOMY;  Surgeon: Florian Buff, MD;  Location: [facility];  Service: Gynecology;  Laterality: N/A;  . RESECTION DISTAL CLAVICAL Right 03/26/2015   Procedure: OPEN DISTAL CLAVICAL RESECTION ;  Surgeon: Netta Cedars, MD;  Location: [place];  Service: Orthopedics;  Laterality: Right;    Allergies: Allergies  Allergen Reactions  . Oxycontin [Oxycodone Hcl] Swelling    Pt tolerates hydromorphone.  . Sulfa Antibiotics Other (See Comments)    Sores  . Tramadol     "Makes me feel like I am falling"  . Penicillins Rash  . Sulfasalazine Other (See Comments)    Sores      Home Medications:  Current Outpatient Medications:  .  acetaminophen (TYLENOL) 325 MG tablet, Take 2 tablets (650 mg total) by mouth every 6 (six) hours as needed for mild pain or headache (or Fever >/= 101)., Disp: , Rfl:  .  Ascorbic Acid (VITAMIN C PO), Take by mouth daily., Disp: , Rfl:  .  cyclobenzaprine (FLEXERIL) 5 MG tablet, SMARTSIG:1 Tablet(s) By Mouth Every 12 Hours PRN, Disp: , Rfl:  .  diclofenac (VOLTAREN) 75 MG EC tablet, 2 (two) times daily., Disp: , Rfl:  .  gabapentin (NEURONTIN) 400 MG capsule, 3 (three) times daily., Disp: , Rfl:  .  HYDROcodone-Acetaminophen 10-325 MG/15ML SOLN, Take 10 mg by mouth daily., Disp: , Rfl:  .  leflunomide (ARAVA) 10 MG tablet, TAKE (1) TABLET BY MOUTH DAILY., Disp: 30 tablet, Rfl: 0 .  lidocaine (LIDODERM) 5 %, SMARTSIG:1-2 Patch(s) Topical Every 12 Hours PRN, Disp: , Rfl:  .  Multiple Vitamins-Minerals (MULTIVITAMIN PO), Take by mouth daily., Disp: , Rfl:  .  pantoprazole (PROTONIX) 40 MG tablet, Take 40 mg by mouth as needed. , Disp: , Rfl:  .  predniSONE (DELTASONE) 5 MG tablet, TAKE 2 TABLETS BY MOUTH DAILY WITH BREAKFAST., Disp: 60 tablet, Rfl: 0 .  torsemide (DEMADEX) 20 MG tablet, Take 40 mg by mouth in the morning, at noon, and at bedtime. Only did 2 pills once a day the last few days., Disp: , Rfl:  .  calcitRIOL (ROCALTROL) 0.25 MCG capsule, TAKE ONE CAPSULE BY MOUTH  THREE TIMES PER WEEK ON MONDAY, WEDNESDAY AND FRIDAY (Patient not taking: Reported on 08/30/2020), Disp: , Rfl:  .  clotrimazole-betamethasone (LOTRISONE) cream, , Disp: , Rfl:  .  diclofenac sodium (VOLTAREN) 1 % GEL, as needed.  (Patient not taking: Reported on 08/30/2020), Disp: , Rfl:  .  docusate sodium (COLACE) 100 MG capsule, Take 100 mg by mouth as needed.  (Patient not taking: Reported on 08/30/2020), Disp: , Rfl:  .  mupirocin ointment (BACTROBAN) 2 %, Apply 1 application topically 2 (two) times daily. (Patient not taking: Reported on 08/30/2020), Disp: 22 g, Rfl: 0 .  nystatin (MYCOSTATIN/NYSTOP) powder, nystatin 100,000 unit/gram topical powder (Patient not taking: Reported on 08/30/2020), Disp: , Rfl:  .  potassium chloride (MICRO-K) 10 MEQ CR capsule, Take 10 mEq by mouth daily. (Patient not taking: Reported on 08/30/2020), Disp: , Rfl:  .  Simethicone (GAS-X PO), Take by mouth as needed. (Patient not taking: Reported on 08/30/2020), Disp: , Rfl:  .  Tapentadol HCl (NUCYNTA ER) 200 MG TB12, Take by mouth every 12 (twelve) hours. (Patient not taking: Reported on 08/30/2020), Disp: , Rfl:  .  VITAMIN K PO, Take by mouth daily. (Patient not taking: Reported on 08/30/2020), Disp: , Rfl:    Family History: family history includes Arthritis in her sister; Diabetes in her paternal grandmother; Hypertension in her mother; Pneumonia in her father.    Social History:   reports that she has never smoked. She has never used smokeless tobacco. She reports that she does not drink alcohol and does not use drugs.   Review of Systems: Constitutional: Denies weight loss/weight gain  Eyes: No changes in vision. ENT: No oral lesions, sore throat.  GI: see HPI.  Heme/Lymph: No easy bruising.  CV: No chest pain.  GU: No hematuria.  Integumentary: No rashes.  Neuro: No headaches.  Psych: No depression/anxiety.  Endocrine: No heat/cold intolerance.  Allergic/Immunologic: No urticaria.  Resp: No cough,  SOB.  Musculoskeletal: + back pain   Physical Examination: BP 139/70 (BP Location: Left Arm, Patient Position: Sitting, Cuff Size: Large)   Pulse 99   Temp 98.7 F (37.1 C) (Oral)   Ht 5\' 2"  (1.575 m)   Wt 240 lb 9.6 oz (109.1 kg)   BMI 44.01 kg/m  Gen: NAD, alert and oriented x 4, obese, in wheelchair. HEENT: PEERLA, EOMI, Neck: supple, no JVD Chest: CTA bilaterally, no wheezes, crackles, or other adventitious sounds CV: RRR, no m/g/c/r Abd: soft, NT, ND, +BS in all four quadrants; no HSM, guarding, ridigity, or rebound tenderness Ext: no edema, well perfused with 2+ pulses, brace on left lower extremity Skin: no rash or lesions noted on observed skin Lymph: no noted LAD  Data Reviewed:  May 2021 labs reviewed in Care Everywhere-CBC with WBC 11.8, hemoglobin 12.4, normal MCV, CMP with creatinine 1.27, GFR 48, LFTs normal. A1c 5.4 - hep B and hep C testing. Ferritin 72. Iron 50. ANA positive at 1-40.   IMPRESSION: 05/01/2018 CT a/p  1. Acute uncomplicated sigmoid diverticulitis. 2. Aortic atherosclerosis without aneurysm Aortic Atherosclerosis (ICD10-I70.0). Coronary artery calcifications, incidental. 3. Suggestion of nodular hepatic contours raising concern for cirrhosis. Recommend correlation with cirrhosis risk factors  Assessment/Plan: Ms. Clift is a 74 y.o. female    Nicole [name] was seen today for rectal bleeding.  Diagnoses and all orders for this visit:  Diverticulitis of colon  Rectal bleeding  Chronic GERD    1.  Rectal bleeding-seen by PCP and diagnosed with hemorrhoids versus diverticulitis. Bleeding is now resolved over the past few weeks and non tender on exam. Last colonoscopy was 8 years ago and will schedule repeat at this time. Last episode of rectal bleeding & diverticulitis prior to Oct 2021 was CT confirmed in 2019.  She will contact me with any return of symptoms prior to colonoscopy. We did discuss avoiding seeds, nuts. She will start a fiber  supplement.  2.  GERD-on PPI twice daily. No dysphagia, n/v. Continue diet modifications.   Patient denies CP, SOB, and use of blood thinners. I discussed the risks and benefits of procedure including bleeding, perforation, infection, missed lesions, medication reactions and possible hospitalization or surgery if complications. All questions answered.  Denies prior issues with sedation.   I personally performed the service, non-incident to. (WP)  Laurine Blazer, [facility] for Gastrointestinal Disease

## 2020-08-30 NOTE — Patient Instructions (Addendum)
Start fiber supplement over the counter - benefiber, citrucel or metamucil are good ones. Make sure you are getting plenty of fluids. We are scheduling colonoscopy for evaluation

## 2020-08-30 NOTE — Telephone Encounter (Signed)
Patient needs Plenvu (copay card) ° °

## 2020-08-31 DIAGNOSIS — I5042 Chronic combined systolic (congestive) and diastolic (congestive) heart failure: Secondary | ICD-10-CM | POA: Diagnosis not present

## 2020-08-31 DIAGNOSIS — M48062 Spinal stenosis, lumbar region with neurogenic claudication: Secondary | ICD-10-CM | POA: Diagnosis not present

## 2020-08-31 DIAGNOSIS — G894 Chronic pain syndrome: Secondary | ICD-10-CM | POA: Diagnosis not present

## 2020-08-31 DIAGNOSIS — M961 Postlaminectomy syndrome, not elsewhere classified: Secondary | ICD-10-CM | POA: Diagnosis not present

## 2020-08-31 DIAGNOSIS — I130 Hypertensive heart and chronic kidney disease with heart failure and stage 1 through stage 4 chronic kidney disease, or unspecified chronic kidney disease: Secondary | ICD-10-CM | POA: Diagnosis not present

## 2020-08-31 DIAGNOSIS — N183 Chronic kidney disease, stage 3 unspecified: Secondary | ICD-10-CM | POA: Diagnosis not present

## 2020-09-01 MED ORDER — PLENVU 140 G PO SOLR: 1.0000 | Freq: Once | ORAL | 0 refills | Status: AC

## 2020-09-01 NOTE — Addendum Note (Signed)
Addended by: Rogene Houston on: 09/01/2020 08:45 PM   Modules accepted: Orders

## 2020-09-05 DIAGNOSIS — R262 Difficulty in walking, not elsewhere classified: Secondary | ICD-10-CM | POA: Diagnosis not present

## 2020-09-05 DIAGNOSIS — M6281 Muscle weakness (generalized): Secondary | ICD-10-CM | POA: Diagnosis not present

## 2020-09-05 DIAGNOSIS — I5042 Chronic combined systolic (congestive) and diastolic (congestive) heart failure: Secondary | ICD-10-CM | POA: Diagnosis not present

## 2020-09-05 DIAGNOSIS — M48062 Spinal stenosis, lumbar region with neurogenic claudication: Secondary | ICD-10-CM | POA: Diagnosis not present

## 2020-09-05 DIAGNOSIS — G894 Chronic pain syndrome: Secondary | ICD-10-CM | POA: Diagnosis not present

## 2020-09-05 DIAGNOSIS — I130 Hypertensive heart and chronic kidney disease with heart failure and stage 1 through stage 4 chronic kidney disease, or unspecified chronic kidney disease: Secondary | ICD-10-CM | POA: Diagnosis not present

## 2020-09-05 DIAGNOSIS — N183 Chronic kidney disease, stage 3 unspecified: Secondary | ICD-10-CM | POA: Diagnosis not present

## 2020-09-05 DIAGNOSIS — M961 Postlaminectomy syndrome, not elsewhere classified: Secondary | ICD-10-CM | POA: Diagnosis not present

## 2020-09-05 DIAGNOSIS — Z6841 Body Mass Index (BMI) 40.0 and over, adult: Secondary | ICD-10-CM | POA: Diagnosis not present

## 2020-09-05 DIAGNOSIS — M353 Polymyalgia rheumatica: Secondary | ICD-10-CM | POA: Diagnosis not present

## 2020-09-07 ENCOUNTER — Ambulatory Visit: Payer: TRICARE For Life (TFL) | Admitting: Physician Assistant

## 2020-09-07 DIAGNOSIS — M170 Bilateral primary osteoarthritis of knee: Secondary | ICD-10-CM

## 2020-09-07 DIAGNOSIS — Z7952 Long term (current) use of systemic steroids: Secondary | ICD-10-CM

## 2020-09-07 DIAGNOSIS — M5136 Other intervertebral disc degeneration, lumbar region: Secondary | ICD-10-CM

## 2020-09-07 DIAGNOSIS — Z8719 Personal history of other diseases of the digestive system: Secondary | ICD-10-CM

## 2020-09-07 DIAGNOSIS — Z8679 Personal history of other diseases of the circulatory system: Secondary | ICD-10-CM

## 2020-09-07 DIAGNOSIS — Z79899 Other long term (current) drug therapy: Secondary | ICD-10-CM

## 2020-09-07 DIAGNOSIS — M4804 Spinal stenosis, thoracic region: Secondary | ICD-10-CM

## 2020-09-07 DIAGNOSIS — G894 Chronic pain syndrome: Secondary | ICD-10-CM

## 2020-09-07 DIAGNOSIS — M353 Polymyalgia rheumatica: Secondary | ICD-10-CM

## 2020-09-07 DIAGNOSIS — M0579 Rheumatoid arthritis with rheumatoid factor of multiple sites without organ or systems involvement: Secondary | ICD-10-CM

## 2020-09-07 DIAGNOSIS — M316 Other giant cell arteritis: Secondary | ICD-10-CM

## 2020-09-07 DIAGNOSIS — R413 Other amnesia: Secondary | ICD-10-CM

## 2020-09-07 DIAGNOSIS — E559 Vitamin D deficiency, unspecified: Secondary | ICD-10-CM

## 2020-09-08 DIAGNOSIS — N183 Chronic kidney disease, stage 3 unspecified: Secondary | ICD-10-CM | POA: Diagnosis not present

## 2020-09-08 DIAGNOSIS — I5042 Chronic combined systolic (congestive) and diastolic (congestive) heart failure: Secondary | ICD-10-CM | POA: Diagnosis not present

## 2020-09-08 DIAGNOSIS — M48062 Spinal stenosis, lumbar region with neurogenic claudication: Secondary | ICD-10-CM | POA: Diagnosis not present

## 2020-09-08 DIAGNOSIS — G894 Chronic pain syndrome: Secondary | ICD-10-CM | POA: Diagnosis not present

## 2020-09-08 DIAGNOSIS — M961 Postlaminectomy syndrome, not elsewhere classified: Secondary | ICD-10-CM | POA: Diagnosis not present

## 2020-09-08 DIAGNOSIS — I130 Hypertensive heart and chronic kidney disease with heart failure and stage 1 through stage 4 chronic kidney disease, or unspecified chronic kidney disease: Secondary | ICD-10-CM | POA: Diagnosis not present

## 2020-09-09 ENCOUNTER — Other Ambulatory Visit: Payer: Self-pay

## 2020-09-09 ENCOUNTER — Encounter: Payer: Self-pay | Admitting: Physician Assistant

## 2020-09-09 ENCOUNTER — Ambulatory Visit (INDEPENDENT_AMBULATORY_CARE_PROVIDER_SITE_OTHER): Payer: Medicare Other | Admitting: Physician Assistant

## 2020-09-09 VITALS — BP 107/74 | HR 102 | Resp 18 | Ht 60.0 in

## 2020-09-09 DIAGNOSIS — Z79899 Other long term (current) drug therapy: Secondary | ICD-10-CM | POA: Diagnosis not present

## 2020-09-09 DIAGNOSIS — M5136 Other intervertebral disc degeneration, lumbar region: Secondary | ICD-10-CM | POA: Diagnosis not present

## 2020-09-09 DIAGNOSIS — Z8719 Personal history of other diseases of the digestive system: Secondary | ICD-10-CM | POA: Diagnosis not present

## 2020-09-09 DIAGNOSIS — G894 Chronic pain syndrome: Secondary | ICD-10-CM | POA: Diagnosis not present

## 2020-09-09 DIAGNOSIS — M353 Polymyalgia rheumatica: Secondary | ICD-10-CM

## 2020-09-09 DIAGNOSIS — E559 Vitamin D deficiency, unspecified: Secondary | ICD-10-CM

## 2020-09-09 DIAGNOSIS — Z7952 Long term (current) use of systemic steroids: Secondary | ICD-10-CM | POA: Diagnosis not present

## 2020-09-09 DIAGNOSIS — M170 Bilateral primary osteoarthritis of knee: Secondary | ICD-10-CM | POA: Diagnosis not present

## 2020-09-09 DIAGNOSIS — M316 Other giant cell arteritis: Secondary | ICD-10-CM | POA: Diagnosis not present

## 2020-09-09 DIAGNOSIS — Z8679 Personal history of other diseases of the circulatory system: Secondary | ICD-10-CM

## 2020-09-09 DIAGNOSIS — M0579 Rheumatoid arthritis with rheumatoid factor of multiple sites without organ or systems involvement: Secondary | ICD-10-CM

## 2020-09-09 DIAGNOSIS — R413 Other amnesia: Secondary | ICD-10-CM

## 2020-09-09 DIAGNOSIS — M4804 Spinal stenosis, thoracic region: Secondary | ICD-10-CM

## 2020-09-09 NOTE — Patient Instructions (Signed)
Standing Labs We placed an order today for your standing lab work.   Please have your standing labs drawn in 1 month then every 3 months   If possible, please have your labs drawn 2 weeks prior to your appointment so that the provider can discuss your results at your appointment.  We have open lab daily Monday through Thursday from 8:30-12:30 PM and 1:30-4:30 PM and Friday from 8:30-12:30 PM and 1:30-4:00 PM at the office of Dr. Bo Merino, [facility].   Please be advised, patients with office appointments requiring lab work will take precedents over walk-in lab work.  If possible, please come for your lab work on Monday and Friday afternoons, as you may experience shorter wait times. The office is located at [address], [place], [place], [place] [address] No appointment is necessary.   Labs are drawn by [facility]. Please bring your co-pay at the time of your lab draw.  You may receive a bill from [facility] for your lab work.  If you wish to have your labs drawn at another location, please call the office 24 hours in advance to send orders.  If you have any questions regarding directions or hours of operation,  please call [phone].   As a reminder, please drink plenty of water prior to coming for your lab work. Thanks!    Tocilizumab injection What is this medicine? TOCILIZUMAB (TOE si LIZ ue mab) is used to treat rheumatoid arthritis and juvenile idiopathic arthritis. It is also used to treat giant cell arteritis and cytokine release syndrome. This medicine may be used for other purposes; ask your health care provider or pharmacist if you have questions. COMMON BRAND NAME(S): Actemra What should I tell my health care provider before I take this medicine? They need to know if you have any of these conditions:  cancer  diabetes  heart disease  hepatitis B or history of hepatitis B infection  high blood pressure  high cholesterol  immune system  problems  infection (especially a virus infection such as chickenpox, cold sores, or herpes)  liver disease  low blood counts, like low white cell, platelet, or red cell counts  multiple sclerosis  recently received or scheduled to receive a vaccination  scheduled to have surgery  stomach or intestine problems  stroke  tuberculosis, a positive skin test for tuberculosis, or have recently been in close contact with someone who has tuberculosis  an unusual or allergic reaction to tocilizumab, other medicines, foods, dyes, or preservatives  pregnant or trying to get pregnant  breast-feeding How should I use this medicine? This medicine is for infusion into a vein or for injection under the skin. It is usually given by a health care professional in a hospital or clinic setting. If you get this medicine at home, you will be taught how to prepare and give this medicine. Use exactly as directed. Take your medicine at regular intervals. Do not take your medicine more often than directed. It is important that you put your used needles and syringes in a special sharps container. Do not put them in a trash can. If you do not have a sharps container, call your pharmacist or healthcare provider to get one. A special MedGuide will be given to you by the pharmacist with each prescription and refill. Be sure to read this information carefully each time. Talk to your pediatrician regarding the use of this medicine in children. While the drug may be prescribed for children as young as  2 years for selected conditions, precautions do apply. Overdosage: If you think you have taken too much of this medicine contact a poison control center or emergency room at once. NOTE: This medicine is only for you. Do not share this medicine with others. What if I miss a dose? It is important not to miss your dose. Call your doctor or health care professional if you are unable to keep an appointment. If you give  yourself the medicine and you miss a dose, take it as soon as you can. If it is almost time for your next dose, take only that dose. Do not take double or extra doses. What may interact with this medicine? Do not take this medicine with any of the following medications:  live virus vaccines This medicine may also interact with the following medications:  biologic medicines such as abatacept, adalimumab, anakinra, certolizumab, etanercept, golimumab, infliximab, rituximab, secukinumab, ustekinumab  birth control pills  certain medicines for cholesterol like atorvastatin, lovastatin, and simvastatin  cyclosporine  omeprazole  steroid medicines like prednisone or cortisone  theophylline  vaccines  warfarin This list may not describe all possible interactions. Give your health care provider a list of all the medicines, herbs, non-prescription drugs, or dietary supplements you use. Also tell them if you smoke, drink alcohol, or use illegal drugs. Some items may interact with your medicine. What should I watch for while using this medicine? Your condition will be monitored carefully while you are receiving this medicine. Tell your doctor or healthcare professional if your symptoms do not start to get better or if they get worse. You may need blood work done while you are taking this medicine. You will be tested for tuberculosis (TB) before you start this medicine. If your doctor prescribes any medicine for TB, you should start taking the TB medicine before starting this medicine. Make sure to finish the full course of TB medicine. This medicine may increase your risk of getting an infection. Call your doctor or health care professional for advice if you get a fever, chills or sore throat, or other symptoms of a cold or flu. Do not treat yourself. Try to avoid being around people who are sick. Talk to your doctor about your risk of cancer. You may be more at risk for certain types of cancers if  you take this medicine. What side effects may I notice from receiving this medicine? Side effects that you should report to your doctor or health care professional as soon as possible:  allergic reactions like skin rash, itching or hives, swelling of the face, lips, or tongue  breathing problems  changes in vision  feeling faint or lightheaded, falls  high blood pressure  signs and symptoms of infection like fever or chills; cough; sore throat; pain or trouble passing urine  signs and symptoms of liver injury like dark yellow or brown urine; general ill feeling or flu-like symptoms; light-colored stools; loss of appetite; nausea; right upper belly pain; unusually weak or tired; yellowing of the eyes or skin  stomach pain  tingling, numbness in the hands or feet  unusual bleeding or bruising  unusually weak or tired Side effects that usually do not require medical attention (report to your doctor or health care professional if they continue or are bothersome):  dizziness  headache  pain, redness, or irritation at site where injected This list may not describe all possible side effects. Call your doctor for medical advice about side effects. You may report side effects  to FDA at 1-800-FDA-1088. Where should I keep my medicine? Keep out of the reach of children. If you are using this medicine at home, you will be instructed on how to store this medicine. Throw away any unused medicine after the expiration date on the label. NOTE: This sheet is a summary. It may not cover all possible information. If you have questions about this medicine, talk to your doctor, pharmacist, or health care provider.  2020 [employer] Standard (2019-01-14 20:51:04)

## 2020-09-09 NOTE — Telephone Encounter (Signed)
Please apply for subcutaneous actemra. Per Hazel Sams, PA-C. Thanks!   Consent obtained and sent to the scan center. Also provided patient with patient assistance application. Provider portion has been placed on Devki's desk.

## 2020-09-09 NOTE — Progress Notes (Signed)
Office Visit Note  Patient: Nicole [name]             Date of Birth: Sep 27, 1946           MRN: [id number]             PCP: Sharilyn Sites, MD Referring: Sharilyn Sites, MD Visit Date: 09/09/2020 Occupation: @GUAROCC @  Subjective:  Discuss medication options   History of Present Illness: Nicole [name] is a 74 y.o. female with history of's seropositive rheumatoid thrice, PMR, and temporal arteritis.  Patient is currently taking Arava 10 mg 1 tablet by mouth daily and prednisone 10 mg by mouth daily.  She could not tolerate the higher dose of Arava due to experiencing GI side effects.  She was apprehensive to start on Actemra after her last office visit but would like to further discuss the potential side effects today. She continues to have generalized myalgias and and weakness due to muscular deconditioning.  She is primarily in a wheelchair and has difficulty with mobility.  She continues to have pain and swelling in her right wrist joint and has noticed increased deafness in both hands and both knee joints.  Patient reports she is also been experiencing increased pedal edema despite taking torsemide as prescribed.   Activities of Daily Living:  Patient reports morning stiffness for 2.5 hours.   Patient Denies nocturnal pain.  Difficulty dressing/grooming: Reports Difficulty climbing stairs: Reports Difficulty getting out of chair: Reports Difficulty using hands for taps, buttons, cutlery, and/or writing: Reports  Review of Systems  Constitutional: Positive for fatigue.  HENT: Positive for mouth dryness. Negative for mouth sores and nose dryness.   Eyes: Positive for dryness. Negative for pain and visual disturbance.  Respiratory: Negative for cough, hemoptysis, shortness of breath and difficulty breathing.   Cardiovascular: Positive for swelling in legs/feet. Negative for chest pain, palpitations and hypertension.  Gastrointestinal: Negative for blood in stool, constipation and diarrhea.   Endocrine: Negative for increased urination.  Genitourinary: Negative for difficulty urinating and painful urination.  Musculoskeletal: Positive for arthralgias, gait problem, joint pain, joint swelling, muscle weakness, morning stiffness and muscle tenderness. Negative for myalgias and myalgias.  Skin: Negative for color change, pallor, rash, hair loss, nodules/bumps, skin tightness, ulcers and sensitivity to sunlight.  Allergic/Immunologic: Negative for susceptible to infections.  Neurological: Positive for numbness and weakness. Negative for dizziness and headaches.  Hematological: Negative for bruising/bleeding tendency and swollen glands.  Psychiatric/Behavioral: Negative for depressed mood and sleep disturbance. The patient is not nervous/anxious.     PMFS History:  Patient Active Problem List   Diagnosis Date Noted   Diverticulitis 05/02/2018   Diverticulitis of colon 05/01/2018   Morbid obesity with BMI of 50.0-59.9, adult ([place]) 05/01/2018   Chronic diastolic HF (heart failure) ([place]) 05/01/2018   On prednisone therapy 05/31/2017   Positive anti-CCP test 05/31/2017   Rheumatoid factor positive 05/31/2017   Memory loss 04/04/2017   Polymyalgia rheumatica ([place]) 04/04/2017   High risk medication use 12/07/2016   DDD (degenerative disc disease), lumbar 09/27/2016   HTN (hypertension) 06/05/2016   Chronic pain syndrome 06/05/2016   Acute diverticulitis 06/02/2016   Diverticulitis large intestine 06/02/2016   Spinal stenosis of lumbosacral region 02/29/2016   Joint pain 02/29/2016   Endometrial polyp 02/06/2014   Postmenopausal bleeding 01/30/2014   GERD (gastroesophageal reflux disease) 09/29/2013   Spinal stenosis, thoracic 09/29/2013   Shingles 09/29/2013   Thoracic or lumbosacral neuritis or radiculitis, unspecified 05/04/2011   Abnormality of gait 05/04/2011  Muscle weakness (generalized) 05/04/2011   Bilateral primary osteoarthritis of knee  04/07/2009   KNEE PAIN 04/07/2009    Past Medical History:  Diagnosis Date   AC (acromioclavicular) joint bone spurs    lt shoulder   Anemia    Arthritis    Carpal tunnel syndrome, bilateral    CHF (congestive heart failure) (HCC)    Collagen vascular disease (HCC)    Gastroesophageal reflux    Headache    recent visit to ER @ [facility] for severe headache   Hypertension    Lumbar stenosis    Hx of ESIs by Dr. Nelva Bush   Polyarthralgia    Polymyalgia [facility])    Shingles    Spinal stenosis     Family History  Problem Relation Age of Onset   Hypertension Mother    Pneumonia Father    Arthritis Sister    Diabetes Paternal Grandmother    Past Surgical History:  Procedure Laterality Date   BACK SURGERY  07/05/2018, 07/2018   x2    CHOLECYSTECTOMY     COLONOSCOPY  06/11/2012   Procedure: COLONOSCOPY;  Surgeon: Jamesetta So, MD;  Location[facility];  Service: Gastroenterology;  Laterality: N/A;   HYSTEROSCOPY WITH D & C N/A 02/25/2014   Procedure: DILATATION AND CURETTAGE /HYSTEROSCOPY;  Surgeon: Florian Buff, MD;  Location: [facility] OR[facility];  Service: Gynecology;  Laterality: N/A;   POLYPECTOMY N/A 02/25/2014   Procedure: POLYPECTOMY;  Surgeon: Florian Buff, MD;  Location: [facility];  Service: Gynecology;  Laterality: N/A;   RESECTION DISTAL CLAVICAL Right 03/26/2015   Procedure: OPEN DISTAL CLAVICAL RESECTION ;  Surgeon: Netta Cedars, MD;  Location: [place];  Service: Orthopedics;  Laterality: Right;   Social History   Social History Narrative   Lives at home alone.   Right-handed.   1-2 cups caffeine daily.   Immunization History  Administered Date(s) Administered   Influenza,inj,quad, With Preservative 07/23/2017   Unspecified SARS-COV-2 Vaccination 06/02/2020, 06/23/2020     Objective: Vital Signs: BP 107/74 (BP Location: Right Arm, Patient Position: Sitting, Cuff Size: Normal)    Pulse (!) 102    Resp 18    Ht 5' (1.524 m)    BMI 46.99 kg/m     Physical Exam Vitals and nursing note reviewed.  Constitutional:      Appearance: She is well-developed.  HENT:     Head: Normocephalic and atraumatic.  Eyes:     Conjunctiva/sclera: Conjunctivae normal.  Pulmonary:     Effort: Pulmonary effort is normal.  Abdominal:     Palpations: Abdomen is soft.  Musculoskeletal:     Cervical back: Normal range of motion.  Skin:    General: Skin is warm and dry.     Capillary Refill: Capillary refill takes less than 2 seconds.  Neurological:     Mental Status: She is alert and oriented to person, place, and time.  Psychiatric:        Behavior: Behavior normal.      Musculoskeletal Exam:  Patient in wheelchair during the exam.  Generalized hyperalgesia noted.  C-spine has limited range of motion with discomfort.  Trapezius muscle tension and muscle tenderness bilaterally.  Painful limited range of motion of the shoulder joints to about 90 degrees bilaterally.  Elbow joints have good range of motion with no tenderness or inflammation.  She has extensor tenosynovitis of the right wrist joint.  Tenderness of both wrist joints noted.  She has tenderness of the right second and  third MCP joints.  She is able to make a complete fist bilaterally.  Hip joints difficult to assess while in the wheelchair.  Knee joints have good range of motion with no warmth or effusion.  Ankle joints have good range of motion.  She has pedal edema bilaterally.  CDAI Exam: CDAI Score: 10  Patient Global: 5 mm; Provider Global: 5 mm Swollen: 1 ; Tender: 8  Joint Exam 09/09/2020      Right  Left  Glenohumeral   Tender   Tender  Wrist  Swollen Tender   Tender  MCP 2   Tender     MCP 3   Tender     Knee   Tender   Tender     Investigation: No additional findings.  Imaging: No results found.  Recent Labs: Lab Results  Component Value Date   WBC 9.3 09/09/2020   HGB 11.9 09/09/2020   PLT 405 (H) 09/09/2020   NA 139 09/09/2020   K 4.4 09/09/2020   CL 100  09/09/2020   CO2 26 09/09/2020   GLUCOSE 101 09/09/2020   BUN 24 09/09/2020   CREATININE 1.29 (H) 09/09/2020   BILITOT 0.2 09/09/2020   ALKPHOS 158 (H) 10/30/2019   AST 19 09/09/2020   ALT 21 09/09/2020   PROT 6.6 09/09/2020   ALBUMIN 4.1 10/30/2019   CALCIUM 9.1 09/09/2020   GFRAA 47 (L) 09/09/2020    Speciality Comments: PLQ Eye Exam: 12/13/17 WNl @ [facility]  Procedures:  No procedures performed Allergies: Oxycontin [oxycodone hcl], Sulfa antibiotics, Tramadol, Penicillins, and Sulfasalazine   Assessment / Plan:     Visit Diagnoses: Polymyalgia rheumatica ([place]): She has persistent generalized myalgias and muscle weakness due to deconditioning.  She continues to work with a home therapist on a regular basis but has limited mobility due to the severity of lower back pain and right-sided radiculopathy.  She is primarily wheelchair-bound.  She is unable to taper the dose of prednisone and continues to take 10 mg by mouth daily and Arava 10 mg by mouth daily.  She is unable to increase the dose of Arava due to GI side effects.  We discussed different treatment options today.  Indications, contraindications, potential side effects of Actemra were discussed in detail.  All questions were addressed and consent was obtained.  We will obtain baseline immunosuppressive lab work.  We are hopeful that she will benefit from starting on Actemra to manage PMR and rheumatoid arthritis.  We are aware that her tumor is currently on back order and we will notify the patient once we have clarification on timing of the shipment.  She is aware that she will return to the office for the administration of the first injection once that has been approved.  She will follow-up in the office in 2 months.  Temporal arteritis ([place]): She has not had any recurrence of symptoms recently.  She will continue on prednisone 10 mg daily and arava 10 mg daily.  Applying for actremra 162 mg sq injections every 14 days.    Rheumatoid arthritis involving multiple sites with positive rheumatoid factor ([place]): She has not ongoing signs or symptoms synovitis of the right wrist joint.  She is currently having pain in both shoulder joints, both wrist joints, both hands, and both knee joints.  She has difficulty ambulating due to the pain in her lower back, right hip, and both knee joints.  She is currently taking Arava 10 mg 1 tablet by mouth daily and  prednisone 10 mg by mouth daily.  She is tolerating both medications without any side effects.  We discussed since she continues to have persistent pain and inflammation she would likely benefit from biologic therapy.  We discussed the indications, contraindications, potential side effects of Actemra.  All questions were addressed and consent was obtained today.  We will apply for Actemra 162 mg subcutaneous injections every other week.  She will remain on injections every 14 days due to the risk of immunosuppression.  She will no longer be able to take diclofenac tablets while on long-term prednisone.  She will be advised to discontinue diclofenac tablets and follow-up with pain management to further discuss.  Once Actemra has been approved she will return the office for the administration of the first injection.  She will continue on Arava and prednisone for now.  She will follow-up in the office in 3 months to assess her response.  Medication counseling:   Baseline Immunosuppressant Therapy Labs Immunoglobulin Electrophoresis Latest Ref Rng & Units 09/09/2020  IgA  70 - 320 mg/dL 269  IgG 600 - 1,540 mg/dL 868  IgM 50 - 300 mg/dL 242    Serum Protein Electrophoresis Latest Ref Rng & Units 09/09/2020  Total Protein 6.1 - 8.1 g/dL 6.6  Albumin 3.8 - 4.8 g/dL -  Alpha-1 0.2 - 0.3 g/dL -  Alpha-2 0.5 - 0.9 g/dL -  Beta Globulin 0.4 - 0.6 g/dL -  Beta 2 0.2 - 0.5 g/dL -  Gamma Globulin 0.8 - 1.7 g/dL -    No results found for: G6PDH  No results found for: TPMT    Lipid Panel No results found for: CHOL, HDL, LDLCALC, LDLDIRECT, TRIG, CHOLHDL   Chest x-ray: 04/17/18: Cardiomegaly with mild bilateral pulmonary interstitial prominence suggesting interstitial edema/CHF  Counseled patient that Actemra is an IL-6 blocking agent.   Counseled patient on purpose, proper use, and adverse effects of Actemra.  Reviewed the most common adverse effects including infections, injection site reaction, bowel injury, and rarely cancer and conditions of the nervous system.  Reviewed that the medication should be held during infections.  Discussed that there is the possibility of an increased risk of malignancy but it is not well understood if this increased risk is due to the medication or the disease state.  Counseled patient that Actemra should be held prior to scheduled surgery.  Counseled patient to avoid live vaccines while on Actemra.  Recommend annual influenza, Pneumovax 23, Prevnar 13, and Shingrix as indicated.   Reviewed the importance of regular labs while on Actemra therapy including the need for routine lipid panel.  Advised patient to get standing labs one month after starting Actemra.  Provided patient with standing lab orders. P rovided patient with medication education material and answered all questions.  Patient voiced understanding.  Patient consented to Actemra.  Will upload consent into the media tab.  Reviewed storage instructions of Actemra with patient.  Advised patient that initial Actemra injection must be given in the office.  Will apply for Actemra through patient's insurance.    Patient dose will be  162 mg every 14 days.  Reduced dose due to concern for immunosuppression. She was advised to monitor for infections closely and monitor her clinical response to this dose of actemra.  Prescription pending lab results and/or insurance approval.  High risk medication use - Arava 20 mg 1 tablet by mouth daily, prednisone 10 mg daily, and applying for actemra  162 mg sq injections every 14 days.  Actemra is currently on backorder and we are unsure when it will return in stock.  Patient was notified of the backorder.  We will update the patient once we have more clarification on the timing of the shipment.  We will apply for Actemra through her insurance as previously discussed.  We will obtain the following baseline immunosuppressive labs prior to starting her on Actemra.  I called the patient today on 09/10/2020 and reviewed CBC, CMP, HIV, hepatitis B, hepatitis C, and immunoglobulin lab results.  All questions were addressed.  Plan: CBC with Differential/Platelet, COMPLETE METABOLIC PANEL WITH GFR, Serum protein electrophoresis with reflex, IgG, IgA, IgM, QuantiFERON-TB Gold Plus, HIV Antibody (routine testing w rflx), Hepatitis B core antibody, IgM, Hepatitis B surface antigen, Hepatitis C antibody I discussed the importance of holding Actemra and Arava anytime she develops signs or symptoms of an infection and to resume once the infection has completely cleared.  She remain on the lower dose of Actemra due to her being high risk for being on immunosuppressive agents.  On prednisone therapy: She is taking prednisone 10 mg a mouth daily.  She has been unable to taper the dose of prednisone further.  She will be starting on Actemra and continue on [employer] as prescribed. We discussed the importance of discontinuing diclofenac while on long-term prednisone.  We discussed the risk of GI perforation.  She was advised to take prednisone with breakfast every morning. Patient reports that 3 to 4 weeks ago she noticed blood in her stool for 3 to 4 days.  She was evaluated by her PCP who felt that her symptoms were likely due to hemorrhoids and/or diverticulitis.  She is scheduled for a colonoscopy on 09/22/2020.  She was advised to notify us of the results and have the results faxed to our office. Order for DEXA remains in place.  Chronic pain syndrome: She is taking  Nucynta, hydrocodone, and diclofenac.  As prescribed.  She continues to have generalized pain and does not feel as though it is well controlled.  She will schedule an appointment with her PCP to discuss discontinuing diclofenac as well as other treatment options.  Primary osteoarthritis of both knees: She has chronic pain in both knee joints.  She has difficulty ambulating due to the pain in her knee joints as well as her lower back.  She has good range of motion of both knees on exam with discomfort bilaterally.  DDD (degenerative disc disease), lumbar: Chronic pain.  She underwent an open lumbar laminectomy of L5-S1 in September 2019 and had a revision performed in October 2019.  She developed a wound infection after surgery at which time she was rehospitalized.  She established care with pain management but it has been difficult for her to be evaluated so regularly due to it being hard for her to ride in the car for prolonged periods of time due to her back pain.  Her PCP has been prescribing Nucynta and diclofenac twice daily for pain relief.  We discussed that she will need to discontinue diclofenac since she is on long-term prednisone 10 mg daily.  Spinal stenosis, thoracic: Chronic pain.  She is followed by pain management.  She is taking nucynta and hydrocodone as needed for pain relief. She will need to discontinue oral diclofenac tablets due to the increased risk of GI perforation while on long term prednisone.  I stressed the importance of following up with Dr. Glennon Mac to discuss other options besides NSAID use while on long term  prednisone.   Other medical conditions are listed as follows:   Vitamin D deficiency  History of diverticulitis: I called the patient today on 09/11/2020 she discuss the diagnosis of diverticulitis upon further chart review.  Patient reports that about 3-4 weeks ago she noticed blood in her stool for about 3 to 4 days.  She states that she was evaluated by her PCP Dr.  Glennon Mac who felt as though her symptoms were likely due to hemorrhoids or diverticulitis flare.  She is scheduled for an upcoming colonoscopy on 09/22/2020.  She was advised to notify us of the results and have the report faxed to our office.   We discussed the increased risk of GI perforations while on medications like Actemra especially when used in combination with prednisone.  We discussed the importance of discontinuing the use of diclofenac while on long-term prednisone.  We discussed the risks of continuing on diclofenac and prednisone in combination.  She was advised to schedule an appointment with her PCP to discuss other treatment options besides diclofenac or any other NSAIDs.  She voiced understanding.  History of hypertension: Blood pressure was 107/74 today in the office.  History of gastroesophageal reflux (GERD)  Memory loss  Orders: Orders Placed This Encounter  Procedures   CBC with Differential/Platelet   COMPLETE METABOLIC PANEL WITH GFR   Serum protein electrophoresis with reflex   IgG, IgA, IgM   QuantiFERON-TB Gold Plus   HIV Antibody (routine testing w rflx)   Hepatitis B core antibody, IgM   Hepatitis B surface antigen   Hepatitis C antibody   No orders of the defined types were placed in this encounter.   Follow-Up Instructions: Return in about 3 months (around 12/10/2020) for Rheumatoid arthritis, Polymyalgia Rheumatica.   Ofilia Neas, PA-C  Note - This record has been created using Dragon software.  Chart creation errors have been sought, but may not always  have been located. Such creation errors do not reflect on  the standard of medical care.

## 2020-09-10 NOTE — Telephone Encounter (Signed)
I called [facility] to see how they are affected by the Actemra shortage. They advised that they are processing applications, but they have been placing patients on hold until medication is in stock. They are not able to provide ETA and states shortage has been going on since August 2021.   Specialty Pharmacies are also affected by this [employer] shortage due to Covid 19.  Please advise if you would like to continue with this option.

## 2020-09-10 NOTE — Telephone Encounter (Signed)
I called the patient to update her on the shortage of Actemra.  We would still like to proceed with applying for Actemra through her insurance and completing the patient assistance application.  Please reach out to the patient once you have any clarification on the timing of shipment or any updates on the shortage.  Thank you!

## 2020-09-10 NOTE — Telephone Encounter (Signed)
Submitted a Prior Authorization request to [employer] for [place] via Cover My Meds. Will update once we receive a response.  PA Key: [id number]  Provider portion of Patient Assistance Application for [place] for [place] completed. Will update when we receive patient portion of application.  Fax# [phone] Phone# [phone]

## 2020-09-10 NOTE — Progress Notes (Signed)
I called the patient to discuss CBC, CMP, immunoglobulins, HIV, hepatitis B, and hepatitis C results.  All questions were addressed.

## 2020-09-13 MED ORDER — ACTEMRA ACTPEN 162 MG/0.9ML ~~LOC~~ SOAJ: 0.9000 mL | SUBCUTANEOUS | 0 refills | Status: DC

## 2020-09-13 NOTE — Telephone Encounter (Signed)
Received notification from [employer] regarding a prior authorization for ACTEMRA ACTPen. Authorization has been APPROVED from 08/14/20 to 10/22/2098 (will likely expire require renewal before this date provided).  Authorization # [id number] Cover My Meds [place] Key: [id number]  Will followup about copay once rx is sent. We've started the [place] application just in case of unaffordable copay and are awaiting patient portion.   [facility]: Phone: [phone] Fax: [phone] (to fax a prescription)  Knox Saliva, PharmD, MPH Clinical Pharmacist (Rheumatology and Pulmonology)

## 2020-09-14 DIAGNOSIS — I5042 Chronic combined systolic (congestive) and diastolic (congestive) heart failure: Secondary | ICD-10-CM | POA: Diagnosis not present

## 2020-09-14 DIAGNOSIS — G894 Chronic pain syndrome: Secondary | ICD-10-CM | POA: Diagnosis not present

## 2020-09-14 DIAGNOSIS — N183 Chronic kidney disease, stage 3 unspecified: Secondary | ICD-10-CM | POA: Diagnosis not present

## 2020-09-14 DIAGNOSIS — M961 Postlaminectomy syndrome, not elsewhere classified: Secondary | ICD-10-CM | POA: Diagnosis not present

## 2020-09-14 DIAGNOSIS — I130 Hypertensive heart and chronic kidney disease with heart failure and stage 1 through stage 4 chronic kidney disease, or unspecified chronic kidney disease: Secondary | ICD-10-CM | POA: Diagnosis not present

## 2020-09-14 DIAGNOSIS — M48062 Spinal stenosis, lumbar region with neurogenic claudication: Secondary | ICD-10-CM | POA: Diagnosis not present

## 2020-09-14 LAB — COMPLETE METABOLIC PANEL WITH GFR
AG Ratio: 1.2 (calc) (ref 1.0–2.5)
ALT: 21 U/L (ref 6–29)
AST: 19 U/L (ref 10–35)
Albumin: 3.6 g/dL (ref 3.6–5.1)
Alkaline phosphatase (APISO): 103 U/L (ref 37–153)
BUN/Creatinine Ratio: 19 (calc) (ref 6–22)
BUN: 24 mg/dL (ref 7–25)
CO2: 26 mmol/L (ref 20–32)
Calcium: 9.1 mg/dL (ref 8.6–10.4)
Chloride: 100 mmol/L (ref 98–110)
Creat: 1.29 mg/dL — ABNORMAL HIGH (ref 0.60–0.93)
GFR, Est African American: 47 mL/min/{1.73_m2} — ABNORMAL LOW (ref >=60)
GFR, Est Non African American: 41 mL/min/{1.73_m2} — ABNORMAL LOW (ref >=60)
Globulin: 3 g/dL (calc) (ref 1.9–3.7)
Glucose, Bld: 101 mg/dL (ref 65–139)
Potassium: 4.4 mmol/L (ref 3.5–5.3)
Sodium: 139 mmol/L (ref 135–146)
Total Bilirubin: 0.2 mg/dL (ref 0.2–1.2)
Total Protein: 6.6 g/dL (ref 6.1–8.1)

## 2020-09-14 LAB — IFE INTERPRETATION: Immunofix Electr Int: NOT DETECTED

## 2020-09-14 LAB — QUANTIFERON-TB GOLD PLUS
Mitogen-NIL: 9.34 IU/mL
NIL: 0.04 IU/mL
QuantiFERON-TB Gold Plus: NEGATIVE
TB1-NIL: 0.08 IU/mL
TB2-NIL: 0.1 IU/mL

## 2020-09-14 LAB — HEPATITIS B CORE ANTIBODY, IGM: Hep B C IgM: NONREACTIVE

## 2020-09-14 LAB — PROTEIN ELECTROPHORESIS, SERUM, WITH REFLEX
Albumin ELP: 3.6 g/dL — ABNORMAL LOW (ref 3.8–4.8)
Alpha 1: 0.4 g/dL — ABNORMAL HIGH (ref 0.2–0.3)
Alpha 2: 1.2 g/dL — ABNORMAL HIGH (ref 0.5–0.9)
Beta 2: 0.4 g/dL (ref 0.2–0.5)
Beta Globulin: 0.4 g/dL (ref 0.4–0.6)
Gamma Globulin: 1 g/dL (ref 0.8–1.7)
Total Protein: 6.9 g/dL (ref 6.1–8.1)

## 2020-09-14 LAB — HEPATITIS C ANTIBODY
Hepatitis C Ab: NONREACTIVE
SIGNAL TO CUT-OFF: 0.02 (ref <1.00)

## 2020-09-14 LAB — CBC WITH DIFFERENTIAL/PLATELET
Absolute Monocytes: 688 cells/uL (ref 200–950)
Basophils Absolute: 56 cells/uL (ref 0–200)
Basophils Relative: 0.6 %
Eosinophils Absolute: 112 cells/uL (ref 15–500)
Eosinophils Relative: 1.2 %
HCT: 36.1 % (ref 35.0–45.0)
Hemoglobin: 11.9 g/dL (ref 11.7–15.5)
Lymphs Abs: 2325 cells/uL (ref 850–3900)
MCH: 29.2 pg (ref 27.0–33.0)
MCHC: 33 g/dL (ref 32.0–36.0)
MCV: 88.7 fL (ref 80.0–100.0)
MPV: 9.7 fL (ref 7.5–12.5)
Monocytes Relative: 7.4 %
Neutro Abs: 6119 cells/uL (ref 1500–7800)
Neutrophils Relative %: 65.8 %
Platelets: 405 10*3/uL — ABNORMAL HIGH (ref 140–400)
RBC: 4.07 10*6/uL (ref 3.80–5.10)
RDW: 14 % (ref 11.0–15.0)
Total Lymphocyte: 25 %
WBC: 9.3 10*3/uL (ref 3.8–10.8)

## 2020-09-14 LAB — HEPATITIS B SURFACE ANTIGEN: Hepatitis B Surface Ag: NONREACTIVE

## 2020-09-14 LAB — IGG, IGA, IGM
IgG (Immunoglobin G), Serum: 868 mg/dL (ref 600–1540)
IgM, Serum: 242 mg/dL (ref 50–300)
Immunoglobulin A: 269 mg/dL (ref 70–320)

## 2020-09-14 LAB — HIV ANTIBODY (ROUTINE TESTING W REFLEX): HIV 1&2 Ab, 4th Generation: NONREACTIVE

## 2020-09-15 ENCOUNTER — Telehealth: Payer: Self-pay | Admitting: Pharmacist

## 2020-09-15 NOTE — Telephone Encounter (Signed)
Received notification from [employer] regarding a prior authorization for ACTEMRA ACTPen. Authorization has been APPROVED from 08/14/20 to 10/22/2098 (will likely expire require renewal before this date provided).  Authorization # [id number] Cover My Meds [place] Key: [id number]  Will followup about copay with [facility]. We've started the [place] application just in case of unaffordable copay and are awaiting patient portion.   [facility]: Phone: [phone] Fax: [phone] (to fax a prescription)  Knox Saliva, PharmD, MPH Clinical Pharmacist (Rheumatology and Pulmonology)

## 2020-09-15 NOTE — Patient Instructions (Signed)
Your procedure is scheduled on: 09/22/2020  Report to [facility] at    6:15 AM.  Call this number if you have problems the morning of surgery: [phone]   Remember:              Follow Directions on the letter you received from Your Physician's office regarding the Bowel Prep              No Smoking the day of Procedure :   Take these medicines the morning of surgery with A SIP OF WATER:Flexeril, Arava, Protonix and Prednisone    Do not wear jewelry, make-up or nail polish.    Do not bring valuables to the hospital.  Contacts, dentures or bridgework may not be worn into surgery.  .   Patients discharged the day of surgery will not be allowed to drive home.     Colonoscopy, Adult, Care After This sheet gives you information about how to care for yourself after your procedure. Your health care provider may also give you more specific instructions. If you have problems or questions, contact your health care provider. What can I expect after the procedure? After the procedure, it is common to have:  A small amount of blood in your stool for 24 hours after the procedure.  Some gas.  Mild abdominal cramping or bloating.  Follow these instructions at home: General instructions   For the first 24 hours after the procedure: ? Do not drive or use machinery. ? Do not sign important documents. ? Do not drink alcohol. ? Do your regular daily activities at a slower pace than normal. ? Eat soft, easy-to-digest foods. ? Rest often.  Take over-the-counter or prescription medicines only as told by your health care provider.  It is up to you to get the results of your procedure. Ask your health care provider, or the department performing the procedure, when your results will be ready. Relieving cramping and bloating  Try walking around when you have cramps or feel bloated.  Apply heat to your abdomen as told by your health care provider. Use a heat source that your health care  provider recommends, such as a moist heat pack or a heating pad. ? Place a towel between your skin and the heat source. ? Leave the heat on for 20-30 minutes. ? Remove the heat if your skin turns bright red. This is especially important if you are unable to feel pain, heat, or cold. You may have a greater risk of getting burned. Eating and drinking  Drink enough fluid to keep your urine clear or pale yellow.  Resume your normal diet as instructed by your health care provider. Avoid heavy or fried foods that are hard to digest.  Avoid drinking alcohol for as long as instructed by your health care provider. Contact a health care provider if:  You have blood in your stool 2-3 days after the procedure. Get help right away if:  You have more than a small spotting of blood in your stool.  You pass large blood clots in your stool.  Your abdomen is swollen.  You have nausea or vomiting.  You have a fever.  You have increasing abdominal pain that is not relieved with medicine. This information is not intended to replace advice given to you by your health care provider. Make sure you discuss any questions you have with your health care provider. Document Released: 05/23/2004 Document Revised: 07/03/2016 Document Reviewed: 12/21/2015 Elsevier Interactive Patient Education  2018 [place].

## 2020-09-19 ENCOUNTER — Other Ambulatory Visit: Payer: Self-pay | Admitting: Physician Assistant

## 2020-09-20 ENCOUNTER — Encounter (HOSPITAL_COMMUNITY): Payer: Self-pay

## 2020-09-20 ENCOUNTER — Other Ambulatory Visit: Payer: Self-pay

## 2020-09-20 ENCOUNTER — Encounter (HOSPITAL_COMMUNITY)
Admission: RE | Admit: 2020-09-20 | Discharge: 2020-09-20 | Disposition: A | Discharge status: Home / Self Care | Payer: Medicare Other | Source: Ambulatory Visit | Attending: Internal Medicine | Admitting: Internal Medicine

## 2020-09-20 ENCOUNTER — Other Ambulatory Visit (HOSPITAL_COMMUNITY)
Admission: RE | Admit: 2020-09-20 | Discharge: 2020-09-20 | Disposition: A | Discharge status: Home / Self Care | Payer: Medicare Other | Source: Ambulatory Visit | Attending: Internal Medicine | Admitting: Internal Medicine

## 2020-09-20 DIAGNOSIS — Z20822 Contact with and (suspected) exposure to covid-19: Secondary | ICD-10-CM | POA: Diagnosis not present

## 2020-09-20 DIAGNOSIS — Z01818 Encounter for other preprocedural examination: Secondary | ICD-10-CM | POA: Insufficient documentation

## 2020-09-20 NOTE — Telephone Encounter (Signed)
Last Visit: 09/09/2020 Next Visit: 11/11/2020 Labs: 09/09/2020 Platelets 405, Creat. 1.29, GFR 47  Current Dose per office note 09/09/2020: prednisone 10 mg daily and arava 10 mg daily DX:  Polymyalgia rheumatica   Okay to refill Prednisone and Arava?

## 2020-09-20 NOTE — Telephone Encounter (Signed)
Contacted [facility] ([facility] preferred pharmacy). Patient's copay is $60 for 30 days.  Pharmacy unable to order Actemra (national backorder due to use for COVID19 treatment). Discussed with Hazel Sams, PA-C, and she's discussed with patient regarding this situation.  Still waiting for patient's portion of [facility] patient assistance application.  Knox Saliva, PharmD, MPH Clinical Pharmacist (Rheumatology and Pulmonology)

## 2020-09-21 LAB — SARS CORONAVIRUS 2 (TAT 6-24 HRS): SARS Coronavirus 2: NEGATIVE

## 2020-09-22 ENCOUNTER — Encounter (HOSPITAL_COMMUNITY): Payer: Self-pay | Admitting: Internal Medicine

## 2020-09-22 ENCOUNTER — Encounter (HOSPITAL_COMMUNITY)
Admission: RE | Disposition: A | Discharge status: Home / Self Care | Payer: Self-pay | Source: Home / Self Care | Attending: Internal Medicine

## 2020-09-22 ENCOUNTER — Ambulatory Visit (HOSPITAL_COMMUNITY)
Admission: RE | Admit: 2020-09-22 | Discharge: 2020-09-22 | Disposition: A | Discharge status: Home / Self Care | Payer: Medicare Other | Attending: Internal Medicine | Admitting: Internal Medicine

## 2020-09-22 ENCOUNTER — Ambulatory Visit (HOSPITAL_COMMUNITY): Payer: Medicare Other | Admitting: Certified Registered"

## 2020-09-22 DIAGNOSIS — K219 Gastro-esophageal reflux disease without esophagitis: Secondary | ICD-10-CM | POA: Diagnosis not present

## 2020-09-22 DIAGNOSIS — K5731 Diverticulosis of large intestine without perforation or abscess with bleeding: Secondary | ICD-10-CM | POA: Insufficient documentation

## 2020-09-22 DIAGNOSIS — K633 Ulcer of intestine: Secondary | ICD-10-CM | POA: Insufficient documentation

## 2020-09-22 DIAGNOSIS — K644 Residual hemorrhoidal skin tags: Secondary | ICD-10-CM | POA: Insufficient documentation

## 2020-09-22 DIAGNOSIS — Z79899 Other long term (current) drug therapy: Secondary | ICD-10-CM | POA: Insufficient documentation

## 2020-09-22 DIAGNOSIS — Z79891 Long term (current) use of opiate analgesic: Secondary | ICD-10-CM | POA: Insufficient documentation

## 2020-09-22 DIAGNOSIS — K5989 Other specified functional intestinal disorders: Secondary | ICD-10-CM | POA: Diagnosis not present

## 2020-09-22 DIAGNOSIS — Z7952 Long term (current) use of systemic steroids: Secondary | ICD-10-CM | POA: Diagnosis not present

## 2020-09-22 DIAGNOSIS — M47816 Spondylosis without myelopathy or radiculopathy, lumbar region: Secondary | ICD-10-CM | POA: Diagnosis not present

## 2020-09-22 DIAGNOSIS — K625 Hemorrhage of anus and rectum: Secondary | ICD-10-CM | POA: Diagnosis not present

## 2020-09-22 DIAGNOSIS — M069 Rheumatoid arthritis, unspecified: Secondary | ICD-10-CM | POA: Insufficient documentation

## 2020-09-22 DIAGNOSIS — K5732 Diverticulitis of large intestine without perforation or abscess without bleeding: Secondary | ICD-10-CM | POA: Diagnosis not present

## 2020-09-22 DIAGNOSIS — Z8719 Personal history of other diseases of the digestive system: Secondary | ICD-10-CM | POA: Diagnosis not present

## 2020-09-22 DIAGNOSIS — K5730 Diverticulosis of large intestine without perforation or abscess without bleeding: Secondary | ICD-10-CM | POA: Diagnosis not present

## 2020-09-22 DIAGNOSIS — I509 Heart failure, unspecified: Secondary | ICD-10-CM | POA: Diagnosis not present

## 2020-09-22 DIAGNOSIS — I110 Hypertensive heart disease with heart failure: Secondary | ICD-10-CM | POA: Diagnosis not present

## 2020-09-22 DIAGNOSIS — M353 Polymyalgia rheumatica: Secondary | ICD-10-CM | POA: Diagnosis not present

## 2020-09-22 DIAGNOSIS — M48061 Spinal stenosis, lumbar region without neurogenic claudication: Secondary | ICD-10-CM | POA: Insufficient documentation

## 2020-09-22 HISTORY — PX: BIOPSY: SHX5522

## 2020-09-22 HISTORY — PX: COLONOSCOPY WITH PROPOFOL: SHX5780

## 2020-09-22 SURGERY — COLONOSCOPY WITH PROPOFOL
Anesthesia: General

## 2020-09-22 MED ORDER — FENTANYL CITRATE (PF) 100 MCG/2ML IJ SOLN
INTRAMUSCULAR | Status: AC
  Filled 2020-09-22: qty 2

## 2020-09-22 MED ORDER — LIDOCAINE HCL (CARDIAC) PF 100 MG/5ML IV SOSY
PREFILLED_SYRINGE | INTRAVENOUS | Status: DC | PRN
  Administered 2020-09-22: 50 mg via INTRAVENOUS

## 2020-09-22 MED ORDER — PROPOFOL 500 MG/50ML IV EMUL
INTRAVENOUS | Status: DC | PRN
  Administered 2020-09-22: 100 ug/kg/min via INTRAVENOUS

## 2020-09-22 MED ORDER — FENTANYL CITRATE (PF) 100 MCG/2ML IJ SOLN
INTRAMUSCULAR | Status: DC | PRN
Start: 2020-09-22 — End: 2020-09-22
  Administered 2020-09-22 (×2): 50 ug via INTRAVENOUS

## 2020-09-22 MED ORDER — LACTATED RINGERS IV SOLN: INTRAVENOUS | Status: DC | PRN

## 2020-09-22 MED ORDER — PROPOFOL 10 MG/ML IV BOLUS
INTRAVENOUS | Status: DC | PRN
  Administered 2020-09-22: 30 mg via INTRAVENOUS
  Administered 2020-09-22: 20 mg via INTRAVENOUS
  Administered 2020-09-22: 50 mg via INTRAVENOUS

## 2020-09-22 MED ORDER — KETAMINE HCL 50 MG/5ML IJ SOSY
PREFILLED_SYRINGE | INTRAMUSCULAR | Status: AC
  Filled 2020-09-22: qty 5

## 2020-09-22 MED ORDER — KETAMINE HCL 10 MG/ML IJ SOLN
INTRAMUSCULAR | Status: DC | PRN
  Administered 2020-09-22: 10 mg via INTRAVENOUS
  Administered 2020-09-22: 20 mg via INTRAVENOUS

## 2020-09-22 MED ORDER — LACTATED RINGERS IV SOLN: INTRAVENOUS | Status: DC

## 2020-09-22 NOTE — Transfer of Care (Signed)
Immediate Anesthesia Transfer of Care Note  Patient: [name]  Procedure(s) Performed: COLONOSCOPY WITH PROPOFOL (N/A ) BIOPSY  Patient Location: PACU  Anesthesia Type:General  Level of Consciousness: awake, alert  and oriented  Airway & Oxygen Therapy: Patient Spontanous Breathing  Post-op Assessment: Report given to RN and Post -op Vital signs reviewed and stable  Post vital signs: Reviewed and stable  Last Vitals:  Vitals Value Taken Time  BP 139/87 09/22/20 0755  Temp    Pulse 100 09/22/20 0755  Resp 19 09/22/20 0755  SpO2 99 % 09/22/20 0755  Vitals shown include unvalidated device data.  Last Pain:  Vitals:   09/22/20 0729  PainSc: 9          Complications: No complications documented.

## 2020-09-22 NOTE — Anesthesia Postprocedure Evaluation (Signed)
Anesthesia Post Note  Patient: Nicole [name]  Procedure(s) Performed: COLONOSCOPY WITH PROPOFOL (N/A ) BIOPSY  Patient location during evaluation: Phase II Anesthesia Type: General Level of consciousness: awake and alert and oriented Pain management: pain level controlled Respiratory status: spontaneous breathing, nonlabored ventilation and respiratory function stable Cardiovascular status: blood pressure returned to baseline and stable Postop Assessment: no apparent nausea or vomiting Anesthetic complications: no   No complications documented.   Last Vitals:  Vitals:   09/22/20 0800 09/22/20 0838  BP: (!) 149/88 (!) 150/85  Pulse: 94 89  Resp: 17 17  Temp:  36.4 C  SpO2: 96% 96%    Last Pain:  Vitals:   09/22/20 0838  TempSrc: Oral  PainSc: [place]

## 2020-09-22 NOTE — Discharge Instructions (Signed)
Resume usual medications as before. Consider stopping diclofenac if possible otherwise use it on as-needed basis.  Do not take it today. High-fiber diet. No driving for 24 hours. Physician will call with biopsy results.      Colonoscopy, Adult, Care After This sheet gives you information about how to care for yourself after your procedure. Your doctor may also give you more specific instructions. If you have problems or questions, call your doctor. What can I expect after the procedure? After the procedure, it is common to have:  A small amount of blood in your poop (stool) for 24 hours.  Some gas.  Mild cramping or bloating in your belly (abdomen). Follow these instructions at home: Eating and drinking   Drink enough fluid to keep your pee (urine) pale yellow.  Follow instructions from your doctor about what you cannot eat or drink.  Return to your normal diet as told by your doctor. Avoid heavy or fried foods that are hard to digest. Activity  Rest as told by your doctor.  Do not sit for a long time without moving. Get up to take short walks every 1-2 hours. This is important. Ask for help if you feel weak or unsteady.  Return to your normal activities as told by your doctor. Ask your doctor what activities are safe for you. To help cramping and bloating:   Try walking around.  Put heat on your belly as told by your doctor. Use the heat source that your doctor recommends, such as a moist heat pack or a heating pad. ? Put a towel between your skin and the heat source. ? Leave the heat on for 20-30 minutes. ? Remove the heat if your skin turns bright red. This is very important if you are unable to feel pain, heat, or cold. You may have a greater risk of getting burned. General instructions  For the first 24 hours after the procedure: ? Do not drive or use machinery. ? Do not sign important documents. ? Do not drink alcohol. ? Do your daily activities more slowly  than normal. ? Eat foods that are soft and easy to digest.  Take over-the-counter or prescription medicines only as told by your doctor.  Keep all follow-up visits as told by your doctor. This is important. Contact a doctor if:  You have blood in your poop 2-3 days after the procedure. Get help right away if:  You have more than a small amount of blood in your poop.  You see large clumps of tissue (blood clots) in your poop.  Your belly is swollen.  You feel like you may vomit (nauseous).  You vomit.  You have a fever.  You have belly pain that gets worse, and medicine does not help your pain. Summary  After the procedure, it is common to have a small amount of blood in your poop. You may also have mild cramping and bloating in your belly.  For the first 24 hours after the procedure, do not drive or use machinery, do not sign important documents, and do not drink alcohol.  Get help right away if you have a lot of blood in your poop, feel like you may vomit, have a fever, or have more belly pain. This information is not intended to replace advice given to you by your health care provider. Make sure you discuss any questions you have with your health care provider. Document Revised: 05/05/2019 Document Reviewed: 05/05/2019 Elsevier Patient Education  [place].  High-Fiber Diet Fiber, also called dietary fiber, is a type of carbohydrate that is found in fruits, vegetables, whole grains, and beans. A high-fiber diet can have many health benefits. Your health care provider may recommend a high-fiber diet to help:  Prevent constipation. Fiber can make your bowel movements more regular.  Lower your cholesterol.  Relieve the following conditions: ? Swelling of veins in the anus (hemorrhoids). ? Swelling and irritation (inflammation) of specific areas of the digestive tract (uncomplicated diverticulosis). ? A problem of the large intestine (colon) that sometimes  causes pain and diarrhea (irritable bowel syndrome, IBS).  Prevent overeating as part of a weight-loss plan.  Prevent heart disease, type 2 diabetes, and certain cancers. What is my plan? The recommended daily fiber intake in grams (g) includes:  38 g for men age 46 or younger.  30 g for men over age 7.  38 g for women age 81 or younger.  21 g for women over age 47. You can get the recommended daily intake of dietary fiber by:  Eating a variety of fruits, vegetables, grains, and beans.  Taking a fiber supplement, if it is not possible to get enough fiber through your diet. What do I need to know about a high-fiber diet?  It is better to get fiber through food sources rather than from fiber supplements. There is not a lot of research about how effective supplements are.  Always check the fiber content on the nutrition facts label of any prepackaged food. Look for foods that contain 5 g of fiber or more per serving.  Talk with a diet and nutrition specialist (dietitian) if you have questions about specific foods that are recommended or not recommended for your medical condition, especially if those foods are not listed below.  Gradually increase how much fiber you consume. If you increase your intake of dietary fiber too quickly, you may have bloating, cramping, or gas.  Drink plenty of water. Water helps you to digest fiber. What are tips for following this plan?  Eat a wide variety of high-fiber foods.  Make sure that half of the grains that you eat each day are whole grains.  Eat breads and cereals that are made with whole-grain flour instead of refined flour or white flour.  Eat brown rice, bulgur wheat, or millet instead of white rice.  Start the day with a breakfast that is high in fiber, such as a cereal that contains 5 g of fiber or more per serving.  Use beans in place of meat in soups, salads, and pasta dishes.  Eat high-fiber snacks, such as berries, raw  vegetables, nuts, and popcorn.  Choose whole fruits and vegetables instead of processed forms like juice or sauce. What foods can I eat?  Fruits Berries. Pears. Apples. Oranges. Avocado. Prunes and raisins. Dried figs. Vegetables Sweet potatoes. Spinach. Kale. Artichokes. Cabbage. Broccoli. Cauliflower. Green peas. Carrots. Squash. Grains Whole-grain breads. Multigrain cereal. Oats and oatmeal. Brown rice. Barley. Bulgur wheat. [place]. Quinoa. Bran muffins. Popcorn. Rye wafer crackers. Meats and other proteins Navy, kidney, and pinto beans. Soybeans. Split peas. Lentils. Nuts and seeds. Dairy Fiber-fortified yogurt. Beverages Fiber-fortified soy milk. Fiber-fortified orange juice. Other foods Fiber bars. The items listed above may not be a complete list of recommended foods and beverages. Contact a dietitian for more options. What foods are not recommended? Fruits Fruit juice. Cooked, strained fruit. Vegetables Fried potatoes. Canned vegetables. Well-cooked vegetables. Grains White bread. Pasta made with refined flour. White rice. Meats and  other proteins Fatty cuts of meat. Fried chicken or fried fish. Dairy Milk. Yogurt. Cream cheese. Sour cream. Fats and oils Butters. Beverages Soft drinks. Other foods Cakes and pastries. The items listed above may not be a complete list of foods and beverages to avoid. Contact a dietitian for more information. Summary  Fiber is a type of carbohydrate. It is found in fruits, vegetables, whole grains, and beans.  There are many health benefits of eating a high-fiber diet, such as preventing constipation, lowering blood cholesterol, helping with weight loss, and reducing your risk of heart disease, diabetes, and certain cancers.  Gradually increase your intake of fiber. Increasing too fast can result in cramping, bloating, and gas. Drink plenty of water while you increase your fiber.  The best sources of fiber include whole fruits and  vegetables, whole grains, nuts, seeds, and beans. This information is not intended to replace advice given to you by your health care provider. Make sure you discuss any questions you have with your health care provider. Document Revised: 08/13/2017 Document Reviewed: 08/13/2017 Elsevier Patient Education  2020 [place] After These instructions provide you with information about caring for yourself after your procedure. Your health care provider may also give you more specific instructions. Your treatment has been planned according to current medical practices, but problems sometimes occur. Call your health care provider if you have any problems or questions after your procedure. What can I expect after the procedure? After your procedure, you may:  Feel sleepy for several hours.  Feel clumsy and have poor balance for several hours.  Feel forgetful about what happened after the procedure.  Have poor judgment for several hours.  Feel nauseous or vomit.  Have a sore throat if you had a breathing tube during the procedure. Follow these instructions at home: For at least 24 hours after the procedure:      Have a responsible adult stay with you. It is important to have someone help care for you until you are awake and alert.  Rest as needed.  Do not: ? Participate in activities in which you could fall or become injured. ? Drive. ? Use heavy machinery. ? Drink alcohol. ? Take sleeping pills or medicines that cause drowsiness. ? Make important decisions or sign legal documents. ? Take care of children on your own. Eating and drinking  Follow the diet that is recommended by your health care provider.  If you vomit, drink water, juice, or soup when you can drink without vomiting.  Make sure you have little or no nausea before eating solid foods. General instructions  Take over-the-counter and prescription medicines only as told by  your health care provider.  If you have sleep apnea, surgery and certain medicines can increase your risk for breathing problems. Follow instructions from your health care provider about wearing your sleep device: ? Anytime you are sleeping, including during daytime naps. ? While taking prescription pain medicines, sleeping medicines, or medicines that make you drowsy.  If you smoke, do not smoke without supervision.  Keep all follow-up visits as told by your health care provider. This is important. Contact a health care provider if:  You keep feeling nauseous or you keep vomiting.  You feel light-headed.  You develop a rash.  You have a fever. Get help right away if:  You have trouble breathing. Summary  For several hours after your procedure, you may feel sleepy and have poor judgment.  Have a responsible adult stay with you for at least 24 hours or until you are awake and alert. This information is not intended to replace advice given to you by your health care provider. Make sure you discuss any questions you have with your health care provider. Document Revised: 01/07/2018 Document Reviewed: 01/30/2016 Elsevier Patient Education  [place].

## 2020-09-22 NOTE — H&P (Signed)
Nicole [name] is an 74 y.o. female.   Chief Complaint: Patient is here for colonoscopy. HPI: Patient is 74 year old African-American female with multiple medical problems including chronic low back pain secondary to DJD and spinal stenosis polymyalgia rheumatica rheumatoid arthritis who is here for diagnostic colonoscopy.  About 2 months ago she had an episode of lower abdominal pain and rectal bleeding.  She was treated with Cipro and metronidazole by her primary care physician with a thought that she had diverticulitis.  She says bleeding lasted about 3 to 4 days.  She has not had any more episodes of bleeding.  Pain also resolved.  She does notice some soreness in left lower quadrant since she took the prep yesterday.  Her main complaint is 1 of back pain because she has not taken her pain medications.  Patient does not take any anticoagulant she does take diclofenac. Last colonoscopy was about 8 years ago. Family history is negative for CRC.  Past Medical History:  Diagnosis Date  . AC (acromioclavicular) joint bone spurs    lt shoulder  . Anemia   . Arthritis   . Carpal tunnel syndrome, bilateral   . CHF (congestive heart failure) ([place])   . Collagen vascular disease ([place])   . Gastroesophageal reflux   . Headache    recent visit to ER @ [facility] for severe headache  . Hypertension   . Lumbar stenosis    Hx of ESIs by Dr. Nelva Bush  . Polyarthralgia   . Polymyalgia ([place])   . Shingles   . Spinal stenosis     Past Surgical History:  Procedure Laterality Date  . BACK SURGERY  07/05/2018, 07/2018   x2   . CHOLECYSTECTOMY    . COLONOSCOPY  06/11/2012   Procedure: COLONOSCOPY;  Surgeon: Jamesetta So, MD;  Location: [facility];  Service: Gastroenterology;  Laterality: N/A;  . HYSTEROSCOPY WITH D & C N/A 02/25/2014   Procedure: DILATATION AND CURETTAGE /HYSTEROSCOPY;  Surgeon: Florian Buff, MD;  Location: [facility] ORS;  Service: Gynecology;  Laterality: N/A;  . POLYPECTOMY N/A 02/25/2014    Procedure: POLYPECTOMY;  Surgeon: Florian Buff, MD;  Location: [facility];  Service: Gynecology;  Laterality: N/A;  . RESECTION DISTAL CLAVICAL Right 03/26/2015   Procedure: OPEN DISTAL CLAVICAL RESECTION ;  Surgeon: Netta Cedars, MD;  Location: [facility];  Service: Orthopedics;  Laterality: Right;    Family History  Problem Relation Age of Onset  . Hypertension Mother   . Pneumonia Father   . Arthritis Sister   . Diabetes Paternal Grandmother    Social History:  reports that she has never smoked. She has never used smokeless tobacco. She reports that she does not drink alcohol and does not use drugs.  Allergies:  Allergies  Allergen Reactions  . Oxycontin [Oxycodone Hcl] Swelling    Pt tolerates hydromorphone.  . Sulfa Antibiotics Other (See Comments)    Sores  . Tramadol     "Makes me feel like I am falling"  . Penicillins Rash  . Sulfasalazine Other (See Comments)    Sores    Medications Prior to Admission  Medication Sig Dispense Refill  . acetaminophen (TYLENOL) 325 MG tablet Take 2 tablets (650 mg total) by mouth every 6 (six) hours as needed for mild pain or headache (or Fever >/= 101).    . calcitRIOL (ROCALTROL) 0.25 MCG capsule Take 0.25 mcg by mouth 3 (three) times a week.     . cyclobenzaprine (FLEXERIL) 5 MG tablet  Take 5 mg by mouth every 12 (twelve) hours as needed for muscle spasms.     . diclofenac (VOLTAREN) 75 MG EC tablet Take 75 mg by mouth 2 (two) times daily.     . diclofenac Sodium (VOLTAREN) 1 % GEL Apply 2 g topically 4 (four) times daily as needed (pain).     [name] gabapentin (NEURONTIN) 400 MG capsule Take 400 mg by mouth 3 (three) times daily.     [name] leflunomide (ARAVA) 10 MG tablet TAKE (1) TABLET BY MOUTH DAILY. 30 tablet 2  . lidocaine (LIDODERM) 5 % Place 1-2 patches onto the skin every 12 (twelve) hours as needed (pain).     . Multiple Vitamins-Minerals (MULTIVITAMIN PO) Take 1 tablet by mouth daily.     . pantoprazole (PROTONIX) 40 MG tablet Take 40 mg by  mouth 2 (two) times daily.     . potassium chloride (KLOR-CON) 10 MEQ tablet Take 10 mEq by mouth daily.    . predniSONE (DELTASONE) 5 MG tablet TAKE 2 TABLETS BY MOUTH DAILY WITH BREAKFAST. 60 tablet 2  . Simethicone (GAS-X PO) Take 1 tablet by mouth 3 (three) times daily as needed (flatulence).     . Tapentadol HCl (NUCYNTA ER) 200 MG TB12 Take 200 mg by mouth in the morning and at bedtime.     . torsemide (DEMADEX) 20 MG tablet Take 40 mg by mouth in the morning, at noon, and at bedtime.     . Ascorbic Acid (VITAMIN C PO) Take by mouth daily. (Patient not taking: Reported on 09/13/2020)    . HYDROcodone-Acetaminophen 10-325 MG/15ML SOLN Take 10 mg by mouth daily. (Patient not taking: Reported on 09/13/2020)    . Tocilizumab (ACTEMRA ACTPEN) 162 MG/0.9ML SOAJ Inject 0.9 mLs into the skin every 14 (fourteen) days. 5.4 mL 0    Results for orders placed or performed during the hospital encounter of 09/20/20 (from the past 48 hour(s))  SARS CORONAVIRUS 2 (TAT 6-24 HRS) Nasopharyngeal Nasopharyngeal Swab     Status: None   Collection Time: 09/20/20 12:47 PM   Specimen: Nasopharyngeal Swab  Result Value Ref Range   SARS Coronavirus 2 NEGATIVE NEGATIVE    Comment: (NOTE) SARS-CoV-2 target nucleic acids are NOT DETECTED.  The SARS-CoV-2 RNA is generally detectable in upper and lower respiratory specimens during the acute phase of infection. Negative results do not preclude SARS-CoV-2 infection, do not rule out co-infections with other pathogens, and should not be used as the sole basis for treatment or other patient management decisions. Negative results must be combined with clinical observations, patient history, and epidemiological information. The expected result is Negative.  Fact Sheet for Patients: SugarRoll.be  Fact Sheet for Healthcare Providers: https://www.woods-mathews.com/  This test is not yet approved or cleared by the [place]  FDA and  has been authorized for detection and/or diagnosis of SARS-CoV-2 by FDA under an Emergency Use Authorization (EUA). This EUA will remain  in effect (meaning this test can be used) for the duration of the COVID-19 declaration under Se ction 564(b)(1) of the Act, 21 U.S.C. section 360bbb-3(b)(1), unless the authorization is terminated or revoked sooner.  Performed at [facility], [place] [address] [place], [place] [address]    No results found.  Review of Systems  Blood pressure (!) 143/87, pulse (!) 102, temperature 98 F (36.7 C), resp. rate 20, SpO2 96 %. Physical Exam HENT:     Mouth/Throat:     Mouth: Mucous membranes are moist.  Pharynx: Oropharynx is clear.  Eyes:     General: No scleral icterus.    Conjunctiva/sclera: Conjunctivae normal.  Cardiovascular:     Rate and Rhythm: Normal rate and regular rhythm.     Heart sounds: Normal heart sounds. No murmur heard.   Pulmonary:     Effort: Pulmonary effort is normal.     Breath sounds: Normal breath sounds.  Abdominal:     Comments: Abdomen is full.  On palpation is soft.  She has mild tenderness at LLQ.  No organomegaly or masses.  Musculoskeletal:        General: Swelling present.     Cervical back: Neck supple.     Comments: She has both pitting and nonpitting edema involving both legs.  Female appears to be predominantly nonpitting.  Lymphadenopathy:     Cervical: No cervical adenopathy.  Skin:    General: Skin is warm and dry.  Neurological:     Mental Status: She is alert.      Assessment/Plan  Rectal bleeding. Recent bout of diverticulitis. Diagnostic colonoscopy.   Hildred Laser, MD 09/22/2020, 7:22 AM

## 2020-09-22 NOTE — Op Note (Signed)
[facility] Patient Name: Nicole [name] Procedure Date: 09/22/2020 7:09 AM MRN: [id number] Date of Birth: [birth year] Attending MD: Hildred Laser , MD CSN: [id number] Age: 74 Admit Type: Outpatient Procedure:                Colonoscopy Indications:              Rectal bleeding, Follow-up of diverticulitis Providers:                Hildred Laser, MD, Otis Peak B. Sharon Seller, RN, Aram Candela, Kristine L. Risa Grill, Technician Referring MD:             Halford Chessman, MD Medicines:                Propofol per Anesthesia Complications:            No immediate complications. Estimated Blood Loss:     Estimated blood loss was minimal. Procedure:                Pre-Anesthesia Assessment:                           - Prior to the procedure, a History and Physical                            was performed, and patient medications and                            allergies were reviewed. The patient's tolerance of                            previous anesthesia was also reviewed. The risks                            and benefits of the procedure and the sedation                            options and risks were discussed with the patient.                            All questions were answered, and informed consent                            was obtained. Prior Anticoagulants: The patient has                            taken no previous anticoagulant or antiplatelet                            agents except for NSAID medication. ASA Grade                            Assessment: III - A patient with severe systemic  disease. After reviewing the risks and benefits,                            the patient was deemed in satisfactory condition to                            undergo the procedure.                           After obtaining informed consent, the colonoscope                            was passed under direct vision. Throughout the                             procedure, the patient's blood pressure, pulse, and                            oxygen saturations were monitored continuously. The                            [id number]-[id number] ([id number]) scope was introduced through                            the anus and advanced to the the cecum, identified                            by appendiceal orifice and ileocecal valve. The                            colonoscopy was performed without difficulty. The                            patient tolerated the procedure well. The quality                            of the bowel preparation was good. The ileocecal                            valve, appendiceal orifice, and rectum were                            photographed. Scope In: 7:34:08 AM Scope Out: 7:50:46 AM Scope Withdrawal Time: 0 hours 11 minutes 15 seconds  Total Procedure Duration: 0 hours 16 minutes 38 seconds  Findings:      Skin tags were found on perianal exam.      A single (solitary) seven mm ulcer was found at the splenic flexure. No       bleeding was present. No stigmata of recent bleeding were seen. Biopsies       were taken with a cold forceps for histology.      Multiple diverticula were found in the sigmoid colon.      External hemorrhoids were found during retroflexion. The hemorrhoids       were small. Impression:               -  Perianal skin tags found on perianal exam.                           - A single (solitary) ulcer at the splenic flexure.                            Biopsied.                           - Diverticulosis in the sigmoid colon.                           - External hemorrhoids.                           Comment: Based on endoscopic findings I wonder                            episode was ischemic colitis rather than                            diverticulitis. Moderate Sedation:      Per Anesthesia Care Recommendation:           - Patient has a contact number available for                             emergencies. The signs and symptoms of potential                            delayed complications were discussed with the                            patient. Return to normal activities tomorrow.                            Written discharge instructions were provided to the                            patient.                           - High fiber diet today.                           - Continue present medications.                           - No aspirin, ibuprofen, naproxen, or other                            non-steroidal anti-inflammatory drugs for 1 day.                           - Await pathology results.                           -  No recommendation at this time regarding repeat                            colonoscopy. Procedure Code(s):        --- Professional ---                           [phone], Colonoscopy, flexible; with biopsy, single                            or multiple Diagnosis Code(s):        --- Professional ---                           K63.3, Ulcer of intestine                           K64.4, Residual hemorrhoidal skin tags                           K62.5, Hemorrhage of anus and rectum                           K57.32, Diverticulitis of large intestine without                            perforation or abscess without bleeding                           K57.30, Diverticulosis of large intestine without                            perforation or abscess without bleeding CPT copyright 2019 American Medical Association. All rights reserved. The codes documented in this report are preliminary and upon coder review may  be revised to meet current compliance requirements. Hildred Laser, MD Hildred Laser, MD 09/22/2020 8:02:10 AM This report has been signed electronically. Number of Addenda: 0

## 2020-09-22 NOTE — Anesthesia Procedure Notes (Signed)
Date/Time: 09/22/2020 7:38 AM Performed by: Orlie Dakin, CRNA Pre-anesthesia Checklist: Patient identified, Emergency Drugs available, Suction available and Patient being monitored Patient Re-evaluated:Patient Re-evaluated prior to induction Oxygen Delivery Method: Non-rebreather mask Induction Type: IV induction Placement Confirmation: positive ETCO2

## 2020-09-22 NOTE — Anesthesia Preprocedure Evaluation (Signed)
Anesthesia Evaluation  Patient identified by MRN, date of birth, ID band Patient awake    Reviewed: Allergy & Precautions, H&P , NPO status , Patient's Chart, lab work & pertinent test results, reviewed documented beta blocker date and time   Airway Mallampati: II  TM Distance: >3 FB Neck ROM: full    Dental no notable dental hx.    Pulmonary neg pulmonary ROS,    Pulmonary exam normal breath sounds clear to auscultation       Cardiovascular Exercise Tolerance: Good hypertension, +CHF   Rhythm:regular Rate:Normal     Neuro/Psych  Headaches,  Neuromuscular disease negative psych ROS   GI/Hepatic Neg liver ROS, GERD  Medicated,  Endo/Other  negative endocrine ROS  Renal/GU negative Renal ROS  negative genitourinary   Musculoskeletal   Abdominal   Peds  Hematology  (+) Blood dyscrasia, anemia ,   Anesthesia Other Findings   Reproductive/Obstetrics negative OB ROS                             Anesthesia Physical Anesthesia Plan  ASA: III  Anesthesia Plan: General   Post-op Pain Management:    Induction:   PONV Risk Score and Plan: Propofol infusion  Airway Management Planned:   Additional Equipment:   Intra-op Plan:   Post-operative Plan:   Informed Consent: I have reviewed the patients History and Physical, chart, labs and discussed the procedure including the risks, benefits and alternatives for the proposed anesthesia with the patient or authorized representative who has indicated his/her understanding and acceptance.     Dental Advisory Given  Plan Discussed with: CRNA  Anesthesia Plan Comments:         Anesthesia Quick Evaluation

## 2020-09-23 LAB — SURGICAL PATHOLOGY

## 2020-09-24 ENCOUNTER — Telehealth (INDEPENDENT_AMBULATORY_CARE_PROVIDER_SITE_OTHER): Payer: Self-pay | Admitting: Internal Medicine

## 2020-09-24 DIAGNOSIS — M48062 Spinal stenosis, lumbar region with neurogenic claudication: Secondary | ICD-10-CM | POA: Diagnosis not present

## 2020-09-24 DIAGNOSIS — M961 Postlaminectomy syndrome, not elsewhere classified: Secondary | ICD-10-CM | POA: Diagnosis not present

## 2020-09-24 DIAGNOSIS — I130 Hypertensive heart and chronic kidney disease with heart failure and stage 1 through stage 4 chronic kidney disease, or unspecified chronic kidney disease: Secondary | ICD-10-CM | POA: Diagnosis not present

## 2020-09-24 DIAGNOSIS — G894 Chronic pain syndrome: Secondary | ICD-10-CM | POA: Diagnosis not present

## 2020-09-24 DIAGNOSIS — I5042 Chronic combined systolic (congestive) and diastolic (congestive) heart failure: Secondary | ICD-10-CM | POA: Diagnosis not present

## 2020-09-24 DIAGNOSIS — N183 Chronic kidney disease, stage 3 unspecified: Secondary | ICD-10-CM | POA: Diagnosis not present

## 2020-09-24 NOTE — Telephone Encounter (Signed)
Patient left message stating she has some questions about the colonoscopy she just had - please advise - ph# [phone]

## 2020-09-27 ENCOUNTER — Encounter (HOSPITAL_COMMUNITY): Payer: Self-pay | Admitting: Internal Medicine

## 2020-09-29 DIAGNOSIS — M48062 Spinal stenosis, lumbar region with neurogenic claudication: Secondary | ICD-10-CM | POA: Diagnosis not present

## 2020-09-29 DIAGNOSIS — G894 Chronic pain syndrome: Secondary | ICD-10-CM | POA: Diagnosis not present

## 2020-09-29 DIAGNOSIS — I5042 Chronic combined systolic (congestive) and diastolic (congestive) heart failure: Secondary | ICD-10-CM | POA: Diagnosis not present

## 2020-09-29 DIAGNOSIS — M961 Postlaminectomy syndrome, not elsewhere classified: Secondary | ICD-10-CM | POA: Diagnosis not present

## 2020-09-29 DIAGNOSIS — N183 Chronic kidney disease, stage 3 unspecified: Secondary | ICD-10-CM | POA: Diagnosis not present

## 2020-09-29 DIAGNOSIS — I130 Hypertensive heart and chronic kidney disease with heart failure and stage 1 through stage 4 chronic kidney disease, or unspecified chronic kidney disease: Secondary | ICD-10-CM | POA: Diagnosis not present

## 2020-10-01 DIAGNOSIS — M48062 Spinal stenosis, lumbar region with neurogenic claudication: Secondary | ICD-10-CM | POA: Diagnosis not present

## 2020-10-01 DIAGNOSIS — N183 Chronic kidney disease, stage 3 unspecified: Secondary | ICD-10-CM | POA: Diagnosis not present

## 2020-10-01 DIAGNOSIS — M961 Postlaminectomy syndrome, not elsewhere classified: Secondary | ICD-10-CM | POA: Diagnosis not present

## 2020-10-01 DIAGNOSIS — I5042 Chronic combined systolic (congestive) and diastolic (congestive) heart failure: Secondary | ICD-10-CM | POA: Diagnosis not present

## 2020-10-01 DIAGNOSIS — I130 Hypertensive heart and chronic kidney disease with heart failure and stage 1 through stage 4 chronic kidney disease, or unspecified chronic kidney disease: Secondary | ICD-10-CM | POA: Diagnosis not present

## 2020-10-01 DIAGNOSIS — G894 Chronic pain syndrome: Secondary | ICD-10-CM | POA: Diagnosis not present

## 2020-10-05 DIAGNOSIS — Z6841 Body Mass Index (BMI) 40.0 and over, adult: Secondary | ICD-10-CM | POA: Diagnosis not present

## 2020-10-05 DIAGNOSIS — M6281 Muscle weakness (generalized): Secondary | ICD-10-CM | POA: Diagnosis not present

## 2020-10-05 DIAGNOSIS — R262 Difficulty in walking, not elsewhere classified: Secondary | ICD-10-CM | POA: Diagnosis not present

## 2020-10-05 DIAGNOSIS — M961 Postlaminectomy syndrome, not elsewhere classified: Secondary | ICD-10-CM | POA: Diagnosis not present

## 2020-10-05 DIAGNOSIS — N183 Chronic kidney disease, stage 3 unspecified: Secondary | ICD-10-CM | POA: Diagnosis not present

## 2020-10-05 DIAGNOSIS — I130 Hypertensive heart and chronic kidney disease with heart failure and stage 1 through stage 4 chronic kidney disease, or unspecified chronic kidney disease: Secondary | ICD-10-CM | POA: Diagnosis not present

## 2020-10-05 DIAGNOSIS — I5042 Chronic combined systolic (congestive) and diastolic (congestive) heart failure: Secondary | ICD-10-CM | POA: Diagnosis not present

## 2020-10-05 DIAGNOSIS — M48062 Spinal stenosis, lumbar region with neurogenic claudication: Secondary | ICD-10-CM | POA: Diagnosis not present

## 2020-10-05 DIAGNOSIS — M353 Polymyalgia rheumatica: Secondary | ICD-10-CM | POA: Diagnosis not present

## 2020-10-05 DIAGNOSIS — G894 Chronic pain syndrome: Secondary | ICD-10-CM | POA: Diagnosis not present

## 2020-10-07 DIAGNOSIS — I130 Hypertensive heart and chronic kidney disease with heart failure and stage 1 through stage 4 chronic kidney disease, or unspecified chronic kidney disease: Secondary | ICD-10-CM | POA: Diagnosis not present

## 2020-10-07 DIAGNOSIS — I5042 Chronic combined systolic (congestive) and diastolic (congestive) heart failure: Secondary | ICD-10-CM | POA: Diagnosis not present

## 2020-10-07 DIAGNOSIS — I5032 Chronic diastolic (congestive) heart failure: Secondary | ICD-10-CM | POA: Diagnosis not present

## 2020-10-07 DIAGNOSIS — E211 Secondary hyperparathyroidism, not elsewhere classified: Secondary | ICD-10-CM | POA: Diagnosis not present

## 2020-10-07 DIAGNOSIS — N183 Chronic kidney disease, stage 3 unspecified: Secondary | ICD-10-CM | POA: Diagnosis not present

## 2020-10-07 DIAGNOSIS — M48062 Spinal stenosis, lumbar region with neurogenic claudication: Secondary | ICD-10-CM | POA: Diagnosis not present

## 2020-10-07 DIAGNOSIS — G894 Chronic pain syndrome: Secondary | ICD-10-CM | POA: Diagnosis not present

## 2020-10-07 DIAGNOSIS — M961 Postlaminectomy syndrome, not elsewhere classified: Secondary | ICD-10-CM | POA: Diagnosis not present

## 2020-10-07 DIAGNOSIS — N1832 Chronic kidney disease, stage 3b: Secondary | ICD-10-CM | POA: Diagnosis not present

## 2020-10-08 DIAGNOSIS — M961 Postlaminectomy syndrome, not elsewhere classified: Secondary | ICD-10-CM | POA: Diagnosis not present

## 2020-10-08 DIAGNOSIS — I130 Hypertensive heart and chronic kidney disease with heart failure and stage 1 through stage 4 chronic kidney disease, or unspecified chronic kidney disease: Secondary | ICD-10-CM | POA: Diagnosis not present

## 2020-10-08 DIAGNOSIS — G894 Chronic pain syndrome: Secondary | ICD-10-CM | POA: Diagnosis not present

## 2020-10-08 DIAGNOSIS — M48062 Spinal stenosis, lumbar region with neurogenic claudication: Secondary | ICD-10-CM | POA: Diagnosis not present

## 2020-10-11 DIAGNOSIS — I5032 Chronic diastolic (congestive) heart failure: Secondary | ICD-10-CM | POA: Diagnosis not present

## 2020-10-11 DIAGNOSIS — E559 Vitamin D deficiency, unspecified: Secondary | ICD-10-CM | POA: Diagnosis not present

## 2020-10-11 DIAGNOSIS — I129 Hypertensive chronic kidney disease with stage 1 through stage 4 chronic kidney disease, or unspecified chronic kidney disease: Secondary | ICD-10-CM | POA: Diagnosis not present

## 2020-10-11 DIAGNOSIS — M48061 Spinal stenosis, lumbar region without neurogenic claudication: Secondary | ICD-10-CM | POA: Diagnosis not present

## 2020-10-11 DIAGNOSIS — F54 Psychological and behavioral factors associated with disorders or diseases classified elsewhere: Secondary | ICD-10-CM | POA: Diagnosis not present

## 2020-10-11 DIAGNOSIS — E211 Secondary hyperparathyroidism, not elsewhere classified: Secondary | ICD-10-CM | POA: Diagnosis not present

## 2020-10-11 DIAGNOSIS — M533 Sacrococcygeal disorders, not elsewhere classified: Secondary | ICD-10-CM | POA: Diagnosis not present

## 2020-10-11 DIAGNOSIS — N1832 Chronic kidney disease, stage 3b: Secondary | ICD-10-CM | POA: Diagnosis not present

## 2020-10-12 DIAGNOSIS — M48062 Spinal stenosis, lumbar region with neurogenic claudication: Secondary | ICD-10-CM | POA: Diagnosis not present

## 2020-10-12 DIAGNOSIS — G894 Chronic pain syndrome: Secondary | ICD-10-CM | POA: Diagnosis not present

## 2020-10-12 DIAGNOSIS — I5042 Chronic combined systolic (congestive) and diastolic (congestive) heart failure: Secondary | ICD-10-CM | POA: Diagnosis not present

## 2020-10-12 DIAGNOSIS — I130 Hypertensive heart and chronic kidney disease with heart failure and stage 1 through stage 4 chronic kidney disease, or unspecified chronic kidney disease: Secondary | ICD-10-CM | POA: Diagnosis not present

## 2020-10-12 DIAGNOSIS — N183 Chronic kidney disease, stage 3 unspecified: Secondary | ICD-10-CM | POA: Diagnosis not present

## 2020-10-12 DIAGNOSIS — M961 Postlaminectomy syndrome, not elsewhere classified: Secondary | ICD-10-CM | POA: Diagnosis not present

## 2020-10-14 DIAGNOSIS — I130 Hypertensive heart and chronic kidney disease with heart failure and stage 1 through stage 4 chronic kidney disease, or unspecified chronic kidney disease: Secondary | ICD-10-CM | POA: Diagnosis not present

## 2020-10-14 DIAGNOSIS — G894 Chronic pain syndrome: Secondary | ICD-10-CM | POA: Diagnosis not present

## 2020-10-14 DIAGNOSIS — M961 Postlaminectomy syndrome, not elsewhere classified: Secondary | ICD-10-CM | POA: Diagnosis not present

## 2020-10-14 DIAGNOSIS — N183 Chronic kidney disease, stage 3 unspecified: Secondary | ICD-10-CM | POA: Diagnosis not present

## 2020-10-14 DIAGNOSIS — M48062 Spinal stenosis, lumbar region with neurogenic claudication: Secondary | ICD-10-CM | POA: Diagnosis not present

## 2020-10-14 DIAGNOSIS — I5042 Chronic combined systolic (congestive) and diastolic (congestive) heart failure: Secondary | ICD-10-CM | POA: Diagnosis not present

## 2020-10-19 DIAGNOSIS — I5042 Chronic combined systolic (congestive) and diastolic (congestive) heart failure: Secondary | ICD-10-CM | POA: Diagnosis not present

## 2020-10-19 DIAGNOSIS — I130 Hypertensive heart and chronic kidney disease with heart failure and stage 1 through stage 4 chronic kidney disease, or unspecified chronic kidney disease: Secondary | ICD-10-CM | POA: Diagnosis not present

## 2020-10-19 DIAGNOSIS — M961 Postlaminectomy syndrome, not elsewhere classified: Secondary | ICD-10-CM | POA: Diagnosis not present

## 2020-10-19 DIAGNOSIS — M48062 Spinal stenosis, lumbar region with neurogenic claudication: Secondary | ICD-10-CM | POA: Diagnosis not present

## 2020-10-19 DIAGNOSIS — N183 Chronic kidney disease, stage 3 unspecified: Secondary | ICD-10-CM | POA: Diagnosis not present

## 2020-10-19 DIAGNOSIS — G894 Chronic pain syndrome: Secondary | ICD-10-CM | POA: Diagnosis not present

## 2020-10-20 ENCOUNTER — Telehealth: Payer: Self-pay

## 2020-10-20 DIAGNOSIS — M316 Other giant cell arteritis: Secondary | ICD-10-CM

## 2020-10-20 DIAGNOSIS — M353 Polymyalgia rheumatica: Secondary | ICD-10-CM

## 2020-10-20 DIAGNOSIS — M0579 Rheumatoid arthritis with rheumatoid factor of multiple sites without organ or systems involvement: Secondary | ICD-10-CM

## 2020-10-20 NOTE — Telephone Encounter (Signed)
Patient called to check the status of her Actemra medication.  Patient is requesting a return call.

## 2020-10-21 DIAGNOSIS — N183 Chronic kidney disease, stage 3 unspecified: Secondary | ICD-10-CM | POA: Diagnosis not present

## 2020-10-21 DIAGNOSIS — G894 Chronic pain syndrome: Secondary | ICD-10-CM | POA: Diagnosis not present

## 2020-10-21 DIAGNOSIS — I130 Hypertensive heart and chronic kidney disease with heart failure and stage 1 through stage 4 chronic kidney disease, or unspecified chronic kidney disease: Secondary | ICD-10-CM | POA: Diagnosis not present

## 2020-10-21 DIAGNOSIS — M48062 Spinal stenosis, lumbar region with neurogenic claudication: Secondary | ICD-10-CM | POA: Diagnosis not present

## 2020-10-21 DIAGNOSIS — I5042 Chronic combined systolic (congestive) and diastolic (congestive) heart failure: Secondary | ICD-10-CM | POA: Diagnosis not present

## 2020-10-21 DIAGNOSIS — M961 Postlaminectomy syndrome, not elsewhere classified: Secondary | ICD-10-CM | POA: Diagnosis not present

## 2020-10-21 NOTE — Telephone Encounter (Signed)
ATC patient. Line rings busy. Unable to leave VM. Will try again on Monday.  Discussed with Hazel Sams, PA-C. May consider trying Kevzara since Actemra is indefinitely backordered. No pharmacy is able to acquire enough supply for new starts. Limited stock/supply received is being reserved for patients already established on Actemra Actpen or syringes.

## 2020-10-27 NOTE — Telephone Encounter (Signed)
Received notification from [employer] regarding a prior authorization for [facility]. Authorization has been APPROVED from 09/27/20 to 10/22/98.   Authorization # [id number]   Per plan patient must fill through [place] home delivery. Test claim shows $68.00 copay for 1 month supply.

## 2020-10-27 NOTE — Telephone Encounter (Signed)
Submitted a Prior Authorization request to [employer] for [employer] via Cover My Meds.   Awaiting clinical questions   (Key: [id number])

## 2020-10-27 NOTE — Telephone Encounter (Signed)
Please start Kevzara BIV.  Dose: 200mg  every 14 days  Indication: M05.71  We are trying to avoid methotrexate d/t kidney function. She was established on Actemra ActPens but unable to access d/t indefinite supply shortage.  Knox Saliva, PharmD, MPH Clinical Pharmacist (Rheumatology and Pulmonology)

## 2020-10-27 NOTE — Telephone Encounter (Signed)
Submitted PA. Awaiting response.  (Key: [place]) - [id number]

## 2020-10-28 DIAGNOSIS — M961 Postlaminectomy syndrome, not elsewhere classified: Secondary | ICD-10-CM | POA: Diagnosis not present

## 2020-10-28 DIAGNOSIS — I5042 Chronic combined systolic (congestive) and diastolic (congestive) heart failure: Secondary | ICD-10-CM | POA: Diagnosis not present

## 2020-10-28 DIAGNOSIS — I130 Hypertensive heart and chronic kidney disease with heart failure and stage 1 through stage 4 chronic kidney disease, or unspecified chronic kidney disease: Secondary | ICD-10-CM | POA: Diagnosis not present

## 2020-10-28 DIAGNOSIS — G894 Chronic pain syndrome: Secondary | ICD-10-CM | POA: Diagnosis not present

## 2020-10-28 DIAGNOSIS — M48062 Spinal stenosis, lumbar region with neurogenic claudication: Secondary | ICD-10-CM | POA: Diagnosis not present

## 2020-10-28 DIAGNOSIS — N183 Chronic kidney disease, stage 3 unspecified: Secondary | ICD-10-CM | POA: Diagnosis not present

## 2020-10-28 NOTE — Telephone Encounter (Signed)
Mailed Kevzara PAP to patient. Advised that it may take 7-10 days to reach her.   She has another medication with pain management that was too expensive. Cost is a significant burden for her and she sees many specialists. Therefore, we will try for Kevzara through PAP rather $68/month through insurance.

## 2020-10-29 NOTE — Telephone Encounter (Signed)
Actemra ActPen is on indefinite backorder. After discussion with Hazel Sams, PA-C, we are moving forward with [facility]. Kevzara BIV f/u will occur in separate encounter.

## 2020-11-02 DIAGNOSIS — I5042 Chronic combined systolic (congestive) and diastolic (congestive) heart failure: Secondary | ICD-10-CM | POA: Diagnosis not present

## 2020-11-02 DIAGNOSIS — M48062 Spinal stenosis, lumbar region with neurogenic claudication: Secondary | ICD-10-CM | POA: Diagnosis not present

## 2020-11-02 DIAGNOSIS — M961 Postlaminectomy syndrome, not elsewhere classified: Secondary | ICD-10-CM | POA: Diagnosis not present

## 2020-11-02 DIAGNOSIS — N183 Chronic kidney disease, stage 3 unspecified: Secondary | ICD-10-CM | POA: Diagnosis not present

## 2020-11-02 DIAGNOSIS — G894 Chronic pain syndrome: Secondary | ICD-10-CM | POA: Diagnosis not present

## 2020-11-02 DIAGNOSIS — I130 Hypertensive heart and chronic kidney disease with heart failure and stage 1 through stage 4 chronic kidney disease, or unspecified chronic kidney disease: Secondary | ICD-10-CM | POA: Diagnosis not present

## 2020-11-03 DIAGNOSIS — M48062 Spinal stenosis, lumbar region with neurogenic claudication: Secondary | ICD-10-CM | POA: Diagnosis not present

## 2020-11-03 DIAGNOSIS — I130 Hypertensive heart and chronic kidney disease with heart failure and stage 1 through stage 4 chronic kidney disease, or unspecified chronic kidney disease: Secondary | ICD-10-CM | POA: Diagnosis not present

## 2020-11-03 DIAGNOSIS — N183 Chronic kidney disease, stage 3 unspecified: Secondary | ICD-10-CM | POA: Diagnosis not present

## 2020-11-03 DIAGNOSIS — G894 Chronic pain syndrome: Secondary | ICD-10-CM | POA: Diagnosis not present

## 2020-11-03 DIAGNOSIS — I5042 Chronic combined systolic (congestive) and diastolic (congestive) heart failure: Secondary | ICD-10-CM | POA: Diagnosis not present

## 2020-11-03 DIAGNOSIS — M961 Postlaminectomy syndrome, not elsewhere classified: Secondary | ICD-10-CM | POA: Diagnosis not present

## 2020-11-03 NOTE — Progress Notes (Deleted)
Office Visit Note  Patient: Nicole [name]             Date of Birth: Jan 13, 1946           MRN: [id number]             PCP: Sharilyn Sites, MD Referring: Sharilyn Sites, MD Visit Date: 11/11/2020 Occupation: @GUAROCC @  Subjective:  No chief complaint on file.   History of Present Illness: Nicole [name] is a 75 y.o. female ***   Activities of Daily Living:  Patient reports morning stiffness for *** {minute/hour:19697}.   Patient {ACTIONS;DENIES/REPORTS:21021675::"Denies"} nocturnal pain.  Difficulty dressing/grooming: {ACTIONS;DENIES/REPORTS:21021675::"Denies"} Difficulty climbing stairs: {ACTIONS;DENIES/REPORTS:21021675::"Denies"} Difficulty getting out of chair: {ACTIONS;DENIES/REPORTS:21021675::"Denies"} Difficulty using hands for taps, buttons, cutlery, and/or writing: {ACTIONS;DENIES/REPORTS:21021675::"Denies"}  No Rheumatology ROS completed.   PMFS History:  Patient Active Problem List   Diagnosis Date Noted  . Diverticulitis 05/02/2018  . Diverticulitis of colon 05/01/2018  . Morbid obesity with BMI of 50.0-59.9, adult ([facility]) 05/01/2018  . Chronic diastolic HF (heart failure) ([place]) 05/01/2018  . On prednisone therapy 05/31/2017  . Positive anti-CCP test 05/31/2017  . Rheumatoid factor positive 05/31/2017  . Memory loss 04/04/2017  . Polymyalgia rheumatica ([place]) 04/04/2017  . High risk medication use 12/07/2016  . DDD (degenerative disc disease), lumbar 09/27/2016  . HTN (hypertension) 06/05/2016  . Chronic pain syndrome 06/05/2016  . Acute diverticulitis 06/02/2016  . Diverticulitis large intestine 06/02/2016  . Spinal stenosis of lumbosacral region 02/29/2016  . Joint pain 02/29/2016  . Endometrial polyp 02/06/2014  . Postmenopausal bleeding 01/30/2014  . GERD (gastroesophageal reflux disease) 09/29/2013  . Spinal stenosis, thoracic 09/29/2013  . Shingles 09/29/2013  . Thoracic or lumbosacral neuritis or radiculitis, unspecified 05/04/2011  . Abnormality of gait  05/04/2011  . Muscle weakness (generalized) 05/04/2011  . Bilateral primary osteoarthritis of knee 04/07/2009  . KNEE PAIN 04/07/2009    Past Medical History:  Diagnosis Date  . AC (acromioclavicular) joint bone spurs    lt shoulder  . Anemia   . Arthritis   . Carpal tunnel syndrome, bilateral   . CHF (congestive heart failure) ([place])   . Collagen vascular disease ([place])   . Gastroesophageal reflux   . Headache    recent visit to ER @ [facility] for severe headache  . Hypertension   . Lumbar stenosis    Hx of ESIs by Dr. Nelva Bush  . Polyarthralgia   . Polymyalgia ([place])   . Shingles   . Spinal stenosis     Family History  Problem Relation Age of Onset  . Hypertension Mother   . Pneumonia Father   . Arthritis Sister   . Diabetes Paternal Grandmother    Past Surgical History:  Procedure Laterality Date  . BACK SURGERY  07/05/2018, 07/2018   x2   . BIOPSY  09/22/2020   Procedure: BIOPSY;  Surgeon: Rogene Houston, MD;  Location: [facility];  Service: Endoscopy;;  . CHOLECYSTECTOMY    . COLONOSCOPY  06/11/2012   Procedure: COLONOSCOPY;  Surgeon: Jamesetta So, MD;  Location: [facility];  Service: Gastroenterology;  Laterality: N/A;  . COLONOSCOPY WITH PROPOFOL N/A 09/22/2020   Procedure: COLONOSCOPY WITH PROPOFOL;  Surgeon: Rogene Houston, MD;  Location: [facility];  Service: Endoscopy;  Laterality: N/A;  730  . HYSTEROSCOPY WITH D & C N/A 02/25/2014   Procedure: DILATATION AND CURETTAGE /HYSTEROSCOPY;  Surgeon: Florian Buff, MD;  Location: [facility];  Service: Gynecology;  Laterality: N/A;  . POLYPECTOMY N/A 02/25/2014  Procedure: POLYPECTOMY;  Surgeon: Florian Buff, MD;  Location: [facility];  Service: Gynecology;  Laterality: N/A;  . RESECTION DISTAL CLAVICAL Right 03/26/2015   Procedure: OPEN DISTAL CLAVICAL RESECTION ;  Surgeon: Netta Cedars, MD;  Location: [place];  Service: Orthopedics;  Laterality: Right;   Social History   Social History Narrative   Lives at home  alone.   Right-handed.   1-2 cups caffeine daily.   Immunization History  Administered Date(s) Administered  . Influenza,inj,quad, With Preservative 07/23/2017  . Unspecified SARS-COV-2 Vaccination 06/02/2020, 06/23/2020     Objective: Vital Signs: There were no vitals taken for this visit.   Physical Exam   Musculoskeletal Exam: ***  CDAI Exam: CDAI Score: -- Patient Global: --; Provider Global: -- Swollen: --; Tender: -- Joint Exam 11/11/2020   No joint exam has been documented for this visit   There is currently no information documented on the homunculus. Go to the Rheumatology activity and complete the homunculus joint exam.  Investigation: No additional findings.  Imaging: No results found.  Recent Labs: Lab Results  Component Value Date   WBC 9.3 09/09/2020   HGB 11.9 09/09/2020   PLT 405 (H) 09/09/2020   NA 139 09/09/2020   K 4.4 09/09/2020   CL 100 09/09/2020   CO2 26 09/09/2020   GLUCOSE 101 09/09/2020   BUN 24 09/09/2020   CREATININE 1.29 (H) 09/09/2020   BILITOT 0.2 09/09/2020   ALKPHOS 158 (H) 10/30/2019   AST 19 09/09/2020   ALT 21 09/09/2020   PROT 6.6 09/09/2020   PROT 6.9 09/09/2020   ALBUMIN 4.1 10/30/2019   CALCIUM 9.1 09/09/2020   GFRAA 47 (L) 09/09/2020   QFTBGOLDPLUS NEGATIVE 09/09/2020    Speciality Comments: PLQ Eye Exam: 12/13/17 WNl @ [facility]  Procedures:  No procedures performed Allergies: Oxycontin [oxycodone hcl], Sulfa antibiotics, Tramadol, Penicillins, and Sulfasalazine   Assessment / Plan:     Visit Diagnoses: No diagnosis found.  Orders: No orders of the defined types were placed in this encounter.  No orders of the defined types were placed in this encounter.   Face-to-face time spent with patient was *** minutes. Greater than 50% of time was spent in counseling and coordination of care.  Follow-Up Instructions: No follow-ups on file.   Earnestine Mealing, CMA  Note - This record has been created  using Editor, commissioning.  Chart creation errors have been sought, but may not always  have been located. Such creation errors do not reflect on  the standard of medical care.

## 2020-11-04 DIAGNOSIS — M961 Postlaminectomy syndrome, not elsewhere classified: Secondary | ICD-10-CM | POA: Diagnosis not present

## 2020-11-04 DIAGNOSIS — M150 Primary generalized (osteo)arthritis: Secondary | ICD-10-CM | POA: Diagnosis not present

## 2020-11-04 DIAGNOSIS — M069 Rheumatoid arthritis, unspecified: Secondary | ICD-10-CM | POA: Diagnosis not present

## 2020-11-04 DIAGNOSIS — R262 Difficulty in walking, not elsewhere classified: Secondary | ICD-10-CM | POA: Diagnosis not present

## 2020-11-04 DIAGNOSIS — Z6841 Body Mass Index (BMI) 40.0 and over, adult: Secondary | ICD-10-CM | POA: Diagnosis not present

## 2020-11-04 DIAGNOSIS — G8929 Other chronic pain: Secondary | ICD-10-CM | POA: Diagnosis not present

## 2020-11-04 DIAGNOSIS — G894 Chronic pain syndrome: Secondary | ICD-10-CM | POA: Diagnosis not present

## 2020-11-04 DIAGNOSIS — M6281 Muscle weakness (generalized): Secondary | ICD-10-CM | POA: Diagnosis not present

## 2020-11-04 DIAGNOSIS — M353 Polymyalgia rheumatica: Secondary | ICD-10-CM | POA: Diagnosis not present

## 2020-11-04 DIAGNOSIS — I5042 Chronic combined systolic (congestive) and diastolic (congestive) heart failure: Secondary | ICD-10-CM | POA: Diagnosis not present

## 2020-11-04 DIAGNOSIS — N183 Chronic kidney disease, stage 3 unspecified: Secondary | ICD-10-CM | POA: Diagnosis not present

## 2020-11-04 DIAGNOSIS — I130 Hypertensive heart and chronic kidney disease with heart failure and stage 1 through stage 4 chronic kidney disease, or unspecified chronic kidney disease: Secondary | ICD-10-CM | POA: Diagnosis not present

## 2020-11-04 DIAGNOSIS — M48062 Spinal stenosis, lumbar region with neurogenic claudication: Secondary | ICD-10-CM | POA: Diagnosis not present

## 2020-11-09 DIAGNOSIS — M961 Postlaminectomy syndrome, not elsewhere classified: Secondary | ICD-10-CM | POA: Diagnosis not present

## 2020-11-09 DIAGNOSIS — M48062 Spinal stenosis, lumbar region with neurogenic claudication: Secondary | ICD-10-CM | POA: Diagnosis not present

## 2020-11-09 DIAGNOSIS — N183 Chronic kidney disease, stage 3 unspecified: Secondary | ICD-10-CM | POA: Diagnosis not present

## 2020-11-09 DIAGNOSIS — G894 Chronic pain syndrome: Secondary | ICD-10-CM | POA: Diagnosis not present

## 2020-11-09 DIAGNOSIS — I130 Hypertensive heart and chronic kidney disease with heart failure and stage 1 through stage 4 chronic kidney disease, or unspecified chronic kidney disease: Secondary | ICD-10-CM | POA: Diagnosis not present

## 2020-11-09 DIAGNOSIS — I5042 Chronic combined systolic (congestive) and diastolic (congestive) heart failure: Secondary | ICD-10-CM | POA: Diagnosis not present

## 2020-11-11 ENCOUNTER — Ambulatory Visit: Payer: TRICARE For Life (TFL) | Admitting: Rheumatology

## 2020-11-11 ENCOUNTER — Ambulatory Visit (INDEPENDENT_AMBULATORY_CARE_PROVIDER_SITE_OTHER): Payer: TRICARE For Life (TFL) | Admitting: Gastroenterology

## 2020-11-11 ENCOUNTER — Ambulatory Visit: Payer: TRICARE For Life (TFL) | Admitting: Physician Assistant

## 2020-11-11 DIAGNOSIS — G894 Chronic pain syndrome: Secondary | ICD-10-CM

## 2020-11-11 DIAGNOSIS — E559 Vitamin D deficiency, unspecified: Secondary | ICD-10-CM

## 2020-11-11 DIAGNOSIS — Z79899 Other long term (current) drug therapy: Secondary | ICD-10-CM

## 2020-11-11 DIAGNOSIS — M170 Bilateral primary osteoarthritis of knee: Secondary | ICD-10-CM

## 2020-11-11 DIAGNOSIS — M0579 Rheumatoid arthritis with rheumatoid factor of multiple sites without organ or systems involvement: Secondary | ICD-10-CM

## 2020-11-11 DIAGNOSIS — M316 Other giant cell arteritis: Secondary | ICD-10-CM

## 2020-11-11 DIAGNOSIS — Z8719 Personal history of other diseases of the digestive system: Secondary | ICD-10-CM

## 2020-11-11 DIAGNOSIS — M5136 Other intervertebral disc degeneration, lumbar region: Secondary | ICD-10-CM

## 2020-11-11 DIAGNOSIS — Z7952 Long term (current) use of systemic steroids: Secondary | ICD-10-CM

## 2020-11-11 DIAGNOSIS — M4804 Spinal stenosis, thoracic region: Secondary | ICD-10-CM

## 2020-11-11 DIAGNOSIS — M353 Polymyalgia rheumatica: Secondary | ICD-10-CM

## 2020-11-11 DIAGNOSIS — R413 Other amnesia: Secondary | ICD-10-CM

## 2020-11-11 DIAGNOSIS — Z8679 Personal history of other diseases of the circulatory system: Secondary | ICD-10-CM

## 2020-11-15 DIAGNOSIS — G894 Chronic pain syndrome: Secondary | ICD-10-CM | POA: Diagnosis not present

## 2020-11-15 DIAGNOSIS — I5042 Chronic combined systolic (congestive) and diastolic (congestive) heart failure: Secondary | ICD-10-CM | POA: Diagnosis not present

## 2020-11-15 DIAGNOSIS — M961 Postlaminectomy syndrome, not elsewhere classified: Secondary | ICD-10-CM | POA: Diagnosis not present

## 2020-11-15 DIAGNOSIS — M48062 Spinal stenosis, lumbar region with neurogenic claudication: Secondary | ICD-10-CM | POA: Diagnosis not present

## 2020-11-15 DIAGNOSIS — N183 Chronic kidney disease, stage 3 unspecified: Secondary | ICD-10-CM | POA: Diagnosis not present

## 2020-11-15 DIAGNOSIS — I130 Hypertensive heart and chronic kidney disease with heart failure and stage 1 through stage 4 chronic kidney disease, or unspecified chronic kidney disease: Secondary | ICD-10-CM | POA: Diagnosis not present

## 2020-11-19 ENCOUNTER — Other Ambulatory Visit: Payer: Self-pay

## 2020-11-19 ENCOUNTER — Encounter: Payer: Self-pay | Admitting: Physician Assistant

## 2020-11-19 ENCOUNTER — Ambulatory Visit (INDEPENDENT_AMBULATORY_CARE_PROVIDER_SITE_OTHER): Payer: Medicare Other | Admitting: Physician Assistant

## 2020-11-19 ENCOUNTER — Telehealth: Payer: Self-pay

## 2020-11-19 VITALS — BP 123/94 | HR 98 | Resp 18 | Ht 60.0 in | Wt 242.0 lb

## 2020-11-19 DIAGNOSIS — Z79899 Other long term (current) drug therapy: Secondary | ICD-10-CM

## 2020-11-19 DIAGNOSIS — G894 Chronic pain syndrome: Secondary | ICD-10-CM

## 2020-11-19 DIAGNOSIS — M4804 Spinal stenosis, thoracic region: Secondary | ICD-10-CM | POA: Diagnosis not present

## 2020-11-19 DIAGNOSIS — M170 Bilateral primary osteoarthritis of knee: Secondary | ICD-10-CM | POA: Diagnosis not present

## 2020-11-19 DIAGNOSIS — K633 Ulcer of intestine: Secondary | ICD-10-CM

## 2020-11-19 DIAGNOSIS — M0579 Rheumatoid arthritis with rheumatoid factor of multiple sites without organ or systems involvement: Secondary | ICD-10-CM

## 2020-11-19 DIAGNOSIS — M353 Polymyalgia rheumatica: Secondary | ICD-10-CM | POA: Diagnosis not present

## 2020-11-19 DIAGNOSIS — Z8719 Personal history of other diseases of the digestive system: Secondary | ICD-10-CM

## 2020-11-19 DIAGNOSIS — E559 Vitamin D deficiency, unspecified: Secondary | ICD-10-CM

## 2020-11-19 DIAGNOSIS — Z8679 Personal history of other diseases of the circulatory system: Secondary | ICD-10-CM

## 2020-11-19 DIAGNOSIS — Z7952 Long term (current) use of systemic steroids: Secondary | ICD-10-CM | POA: Diagnosis not present

## 2020-11-19 DIAGNOSIS — R413 Other amnesia: Secondary | ICD-10-CM

## 2020-11-19 DIAGNOSIS — M5136 Other intervertebral disc degeneration, lumbar region: Secondary | ICD-10-CM

## 2020-11-19 DIAGNOSIS — M316 Other giant cell arteritis: Secondary | ICD-10-CM | POA: Diagnosis not present

## 2020-11-19 NOTE — Progress Notes (Signed)
Office Visit Note  Patient: Nicole [name]             Date of Birth: 1946/10/21           MRN: [id number]             PCP: Sharilyn Sites, MD Referring: Sharilyn Sites, MD Visit Date: 11/19/2020 Occupation: '@GUAROCC' @  Subjective:  Low back pain   History of Present Illness: [name] is a 75 y.o. female with history of seropositive rheumatoid arthritis, polymyalgia rheumatica, and osteoarthritis.  She is currently taking arava 10 mg 1 tablet by mouth daily and prednisone 5 mg 2 tablets daily.  She continues to have pain in multiple joints including both hands, wrist joints, both shoulders, both knee joints, and her lower back.  She has ongoing severe lower back pain with right sided radiculopathy.  She continues to follow up with pain management on a regular basis. She reports they are trying to get her a back brace to wear for support.  She remains primarily wheelchair bound and continues to work with home therapy. She would like to renew PT.  She has been taking nucynta 200 mg BID, gabapentin 400 mg TID, and diclofenac 75 mg BID for pain relief.  She has also been using ice topically on her back and right hip for pain relief.  She denies any recent infections.   She states she had an updated colonoscopy on 09/22/20.  She denies any flares of diverticulitis.  She states she was notified that she had a ulcer in her colon but it was not bleeding.       Activities of Daily Living:  Patient reports morning stiffness for 2  hours.   Patient Denies nocturnal pain.  Difficulty dressing/grooming: Reports Difficulty climbing stairs: Reports Difficulty getting out of chair: Reports Difficulty using hands for taps, buttons, cutlery, and/or writing: Reports  Review of Systems  Constitutional: Positive for fatigue.  HENT: Positive for mouth dryness and nose dryness. Negative for mouth sores.   Eyes: Positive for visual disturbance. Negative for pain, itching and dryness.  Respiratory: Positive  for shortness of breath. Negative for difficulty breathing.   Cardiovascular: Positive for swelling in legs/feet. Negative for chest pain and palpitations.  Gastrointestinal: Negative for blood in stool, constipation and diarrhea.  Endocrine: Negative for increased urination.  Genitourinary: Negative for difficulty urinating.  Musculoskeletal: Positive for arthralgias, gait problem, joint pain, joint swelling, myalgias, muscle weakness, morning stiffness, muscle tenderness and myalgias.  Skin: Positive for color change. Negative for rash and redness.  Allergic/Immunologic: Negative for susceptible to infections.  Neurological: Positive for numbness and headaches. Negative for dizziness.  Hematological: Positive for bruising/bleeding tendency.  Psychiatric/Behavioral: Negative for depressed mood and sleep disturbance. The patient is not nervous/anxious.     PMFS History:  Patient Active Problem List   Diagnosis Date Noted  . Diverticulitis 05/02/2018  . Diverticulitis of colon 05/01/2018  . Morbid obesity with BMI of 50.0-59.9, adult ([place]) 05/01/2018  . Chronic diastolic HF (heart failure) ([place]) 05/01/2018  . On prednisone therapy 05/31/2017  . Positive anti-CCP test 05/31/2017  . Rheumatoid factor positive 05/31/2017  . Memory loss 04/04/2017  . Polymyalgia rheumatica ([place]) 04/04/2017  . High risk medication use 12/07/2016  . DDD (degenerative disc disease), lumbar 09/27/2016  . HTN (hypertension) 06/05/2016  . Chronic pain syndrome 06/05/2016  . Acute diverticulitis 06/02/2016  . Diverticulitis large intestine 06/02/2016  . Spinal stenosis of lumbosacral region 02/29/2016  . Joint pain 02/29/2016  .  Endometrial polyp 02/06/2014  . Postmenopausal bleeding 01/30/2014  . GERD (gastroesophageal reflux disease) 09/29/2013  . Spinal stenosis, thoracic 09/29/2013  . Shingles 09/29/2013  . Thoracic or lumbosacral neuritis or radiculitis, unspecified 05/04/2011  . Abnormality of gait  05/04/2011  . Muscle weakness (generalized) 05/04/2011  . Bilateral primary osteoarthritis of knee 04/07/2009  . KNEE PAIN 04/07/2009    Past Medical History:  Diagnosis Date  . AC (acromioclavicular) joint bone spurs    lt shoulder  . Anemia   . Arthritis   . Carpal tunnel syndrome, bilateral   . CHF (congestive heart failure) ([place])   . Collagen vascular disease ([place])   . Gastroesophageal reflux   . Headache    recent visit to ER @ [facility] for severe headache  . Hypertension   . Lumbar stenosis    Hx of ESIs by Dr. Nelva Bush  . Polyarthralgia   . Polymyalgia ([place])   . Shingles   . Spinal stenosis     Family History  Problem Relation Age of Onset  . Hypertension Mother   . Pneumonia Father   . Arthritis Sister   . Diabetes Paternal Grandmother    Past Surgical History:  Procedure Laterality Date  . BACK SURGERY  07/05/2018, 07/2018   x2   . BIOPSY  09/22/2020   Procedure: BIOPSY;  Surgeon: Rogene Houston, MD;  [facility];  Service: Endoscopy;;  . CHOLECYSTECTOMY    . COLONOSCOPY  06/11/2012   Procedure: COLONOSCOPY;  Surgeon: Jamesetta So, MD;  Location: [facility];  Service: Gastroenterology;  Laterality: N/A;  . COLONOSCOPY WITH PROPOFOL N/A 09/22/2020   Procedure: COLONOSCOPY WITH PROPOFOL;  Surgeon: Rogene Houston, MD;  Location: [facility];  Service: Endoscopy;  Laterality: N/A;  730  . HYSTEROSCOPY WITH D & C N/A 02/25/2014   Procedure: DILATATION AND CURETTAGE /HYSTEROSCOPY;  Surgeon: Florian Buff, MD;  Location: [facility];  Service: Gynecology;  Laterality: N/A;  . POLYPECTOMY N/A 02/25/2014   Procedure: POLYPECTOMY;  Surgeon: Florian Buff, MD;  Location: [facility];  Service: Gynecology;  Laterality: N/A;  . RESECTION DISTAL CLAVICAL Right 03/26/2015   Procedure: OPEN DISTAL CLAVICAL RESECTION ;  Surgeon: Netta Cedars, MD;  Location: [facility];  Service: Orthopedics;  Laterality: Right;   Social History   Social History Narrative   Lives at home  alone.   Right-handed.   1-2 cups caffeine daily.   Immunization History  Administered Date(s) Administered  . Influenza,inj,quad, With Preservative 07/23/2017  . Unspecified SARS-COV-2 Vaccination 06/02/2020, 06/23/2020     Objective: Vital Signs: BP (!) 123/94 (BP Location: Left Wrist, Patient Position: Sitting, Cuff Size: Normal)   Pulse 98   Resp 18   Ht 5' (1.524 m)   Wt 242 lb (109.8 kg) Comment: per patient, patient in wheelchair  BMI 47.26 kg/m    Physical Exam Vitals and nursing note reviewed.  Constitutional:      Appearance: She is well-developed and well-nourished.  HENT:     Head: Normocephalic and atraumatic.  Eyes:     Extraocular Movements: EOM normal.     Conjunctiva/sclera: Conjunctivae normal.  Cardiovascular:     Pulses: Intact distal pulses.  Pulmonary:     Effort: Pulmonary effort is normal.  Abdominal:     Palpations: Abdomen is soft.  Musculoskeletal:     Cervical back: Normal range of motion.  Skin:    General: Skin is warm and dry.     Capillary Refill:  Capillary refill takes less than 2 seconds.  Neurological:     Mental Status: She is alert and oriented to person, place, and time.  Psychiatric:        Mood and Affect: Mood and affect normal.        Behavior: Behavior normal.      Musculoskeletal Exam: Patient in a wheelchair throughout the duration of the exam. Generalized hyperalgesia.  C-spine limited ROM with lateral rotation.  Postural thoracic kyphosis. Severe pain in lumbar spine. Painful and limited ROM of both shoulders.  Limited abduction to about 90 degrees.  Weakness of both upper and lower extremities noted.  Elbow joints good ROM with no tenderness.  Extensor tenosynovitis of right wrist.   Warmth of both knee joints noted.  Pedal edema noted bilaterally.    CDAI Exam: CDAI Score: 21.6  Patient Global: 10 mm; Provider Global: 6 mm Swollen: 5 ; Tender: 16  Joint Exam 11/19/2020      Right  Left  Glenohumeral   Tender    Tender  Wrist  Swollen Tender   Tender  MCP 2  Swollen Tender   Tender  MCP 3  Swollen Tender   Tender  MCP 4   Tender   Tender  PIP 2   Tender     PIP 3   Tender     PIP 4   Tender     Lumbar Spine   Tender     Knee  Swollen Tender  Swollen Tender     Investigation: No additional findings.  Imaging: No results found.  Recent Labs: Lab Results  Component Value Date   WBC 9.3 09/09/2020   HGB 11.9 09/09/2020   PLT 405 (H) 09/09/2020   NA 139 09/09/2020   K 4.4 09/09/2020   CL 100 09/09/2020   CO2 26 09/09/2020   GLUCOSE 101 09/09/2020   BUN 24 09/09/2020   CREATININE 1.29 (H) 09/09/2020   BILITOT 0.2 09/09/2020   ALKPHOS 158 (H) 10/30/2019   AST 19 09/09/2020   ALT 21 09/09/2020   PROT 6.6 09/09/2020   PROT 6.9 09/09/2020   ALBUMIN 4.1 10/30/2019   CALCIUM 9.1 09/09/2020   GFRAA 47 (L) 09/09/2020   QFTBGOLDPLUS NEGATIVE 09/09/2020    Speciality Comments: PLQ Eye Exam: 12/13/17 WNl @ [facility]  Procedures:  No procedures performed Allergies: Oxycontin [oxycodone hcl], Sulfa antibiotics, Tramadol, Penicillins, and Sulfasalazine     Assessment / Plan:     Visit Diagnoses: Polymyalgia rheumatica ([place]): She has persistent pain and stiffness in both shoulder joints and both hip joints. ESR has remained elevated. She has painful and limited ROM of both shoulder joints noted on exam. She has generalized muscle weakness secondary to muscular deconditioning.  She continues to take arava 10 mg 1 tablet by mouth daily and prednisone 5 mg 2 tablets daily.  Our initial plan was to initiate actemra to try to better manage PMR and RA but actemra is unavailable at this time due to the national shortage.  We are in the process of applying for kevzara 200 mg sq injections every 14 days.  The plan will be to try to taper prednisone as Darcus Pester begins to start managing her pain and inflammation. She is aware of the long term risks of prednisone especially when used concurrently  with oral diclofenac. We will notify her once Darcus Pester has been approved and she will return to the office for the administration of the first injection.  She will continue  to take arava as prescribed.    Temporal arteritis ([place]): No temporal artery tenderness.  No recurrence of symptoms. She remains on prednisone 5 mg 2 tablets daily. She was unable to start on actemra due to the national shortage.  Applying for kevzara.    Rheumatoid arthritis involving multiple sites with positive rheumatoid factor ([place]): She has joint tenderness and synovitis of multiple joints as described above.  She has ongoing extensor tenosynovitis of the right wrist joint and synovitis of the right 2nd and 3rd MCP joints.  She has difficulty making a complete fist due to the severity of pain and stiffness.  She continues to have chronic pain in both knee joints and is primarily wheelchair bound.  She has painful ROM of both knees with warmth but no effusion on exam.  A referral to extend home therapy was completed today. Her rheumatoid arthritis remains uncontrolled on arava 10 mg 1 tablet daily and prednisone 5 mg 2 tablets daily.  Indications, contraindications, and potential side effects of kevzara were reviewed today.  All questions were addressed and consent was obtained.  Discussed the importance of trying to discontinue oral diclofenac due to the risk of GI perforation while on prednisone and with the initiation of kevzara.  She has known non-bleeding colonic ulcer evident on colonoscopy from 09/22/20.  She has been taking protonix 40 mg BID and taking prednisone with food.  The office visit note from today will be forwarded to her gastroenterologist Dr. Laural Golden, so he will be aware of these medication modifications.   We will notify her once Darcus Pester has been approved through her insurance and she will return to the office for administration of the first injection.  She will return lab work 1 month then every 3 months after  starting on [place]. She will follow up 6 weeks after starting on kevzara to assess her response.   Medication counseling:  TB Test: Negative on 09/09/20 and will continue to be monitored yearly.  Hepatitis panel: Negative on 09/09/20 HIV: Negative on 09/09/20 SPEP: IFE did not reveal any monoclonal proteins on 09/09/20  Immunoglobulins: WNL 09/09/20 Lipid panel: Pt not fasting today.  Future order placed.  Chest x-ray: 04/16/18 Counseled patient that Darcus Pester is an IL-6 blocking agent.  Reviewed Kevzara dose of 200 mg sq injections once every 2 weeks.  Counseled patient on purpose, proper use, and adverse effects of Kevzara.  Reviewed the most common adverse effects including infections, injection site reaction, bowel injury, and rarely cancer.  Reviewed that the medication should be held during infections.  Discussed that there is the possibility of an increased risk of malignancy but it is not well understood if this increased risk is due to the medication or the disease state.  Reviewed the importance of regular labs while on Kevzara therapy including the need for routine lipid panel.  Advised patient to get standing labs one month after starting Kevzara.  Provided patient with standing lab orders.  Counseled patient that Darcus Pester should be held prior to scheduled surgery.  Counseled patient to avoid live vaccines while on Kevzara.  Advised patient to get annual influenza vaccine and the pneumococcal vaccine.  Provided patient with medication education material and answered all questions.  Patient voiced understanding.  Patient consented to [facility].  Will upload consent into the media tab.  Reviewed storage instructions of Darcus Pester with patient.  Advised it should be kept in the refrigerator until she is ready for use.  Advised patient that initial Kevzara injection must  be given in the office.  Will apply for Kevzara through patient's insurance.    High risk medication use -Applying for Kevzara 200 mg  sq injections every 2 weeks through her insurance.  Actemra not available due to national shortage. She will continue taking Arava 10 mg 1 tablet by mouth daily and prednisone 5 mg 2 tablets by mouth daily. CBC and CMP updated on 09/09/20.  CBC and CMP updated today while she was in the office.  TB gold negative on 09/09/20 and will continue to be monitored yearly.  Rest of baseline immunosuppressive lab work updated on 09/09/20. - Plan: CBC with Differential/Platelet, COMPLETE METABOLIC PANEL WITH GFR She has not had any recent infections.  Discussed the importance of holding arava and kevzara if she develops signs or symptoms of an infection and to resume once the infection has completely cleared.    On prednisone therapy - She is long term prednisone 5 mg  2 tablets by mouth daily. She is aware of the long term risks of prednisone use.  Chronic pain syndrome - She is following along closely with pain management. The dose of nucynta was recently increased to 200 mg BID and she remains on diclofenac 75 mg BID.  Discussed the risk of concurrent use of oral diclofenac and prednisone.  Primary osteoarthritis of both knees: Chronic pain.  She has warmth of both knee joints on exam.   DDD (degenerative disc disease), lumbar: Chronic pain.  She is primarily wheelchair bound due to the severity of pain and muscular deconditioning.  She has been following up closely with pain management.  She remains on Gabapentin 400 mg TID, diclofenac 75 mg 1 tablet BID, and recently increased the dose of nucynta to 200 mg BID for pain relief.  She was in significant discomfort today while in the office.  She has difficulty siting in her wheelchair for prolonged periods of time. A referral for home physical therapy was placed today to continue muscle strengthening.   Spinal stenosis, thoracic - She is followed by pain management.  She is taking nucynta, gabapentin, and diclofenac tablets as prescribed for pain relief.  She  uses lidoderm 5% patches as needed.    Colonic ulcer: Colonoscopy updated on 09/22/20 performed by Dr. Laural Golden.  Results revealed a single solitary 7 mm ulcer at splenic flexure.  No bleeding present at that time.  Multiple diverticula in sigmoid.  She has not had any recent flares of diverticulitis.  She is not experiencing any abdominal pain or blood in her stool at this time.  She has been taking protonix 40 mg BID as prescribed. Reviewed colonoscopy results and medication list with Dr. Estanislado Pandy today.  Discussed the risk of concurrent use of diclofenac 75 mg tablet BID and prednisone 10 mg daily.  She has been taking protonix 40 mg BID and taking prednisone with food. We have advised her to discontinue diclofenac several times in the past and to discuss options other than NSAIDs with her pain management specialist. Discussed the associated risk of GI perforation in patients on kevzara as well.  The plan will be to start tapering the prednisone dose once kevzara begins to be effective. She voiced understanding and consent to start on kevzara was obtained today. Note will be forwarded to Dr. Laural Golden today to review and make any further recommendations if necessary.    History of diverticulitis: No recent flares.  Multiple diverticula noted on colonoscopy on 09/22/20.  Advised to notify us if she has any  recurrence of diverticulitis.  She was advised to continue to follow up with her GI specialist closely.   If she develops severe abdominal pain she was advised to be evaluated in the ED urgently and hold kevzara until she has been cleared to resume.   Other medical conditions are listed as follows:   Vitamin D deficiency   History of gastroesophageal reflux (GERD)  History of hypertension  Memory loss  Orders: Orders Placed This Encounter  Procedures  . CBC with Differential/Platelet  . COMPLETE METABOLIC PANEL WITH GFR  . Lipid panel   No orders of the defined types were placed in this  encounter.    Follow-Up Instructions: Return in about 2 months (around 01/17/2021) for Rheumatoid arthritis, Polymyalgia Rheumatica, Osteoarthritis.   Ofilia Neas, PA-C  Note - This record has been created using Dragon software.  Chart creation errors have been sought, but may not always  have been located. Such creation errors do not reflect on  the standard of medical care.

## 2020-11-19 NOTE — Telephone Encounter (Signed)
New prescription for physical therapy was faxed to [facility] to continue physical therapy. (rx signed by Hazel Sams, PA-C)   [facility]  Phone # [phone] Fax # [phone]

## 2020-11-19 NOTE — Patient Instructions (Addendum)
Standing Labs We placed an order today for your standing lab work.   Please have your standing labs drawn in 1 month then every 3 months AFTER starting on kevzara  If possible, please have your labs drawn 2 weeks prior to your appointment so that the provider can discuss your results at your appointment.  We have open lab daily Monday through Thursday from 8:30-12:30 PM and 1:30-4:30 PM and Friday from 8:30-12:30 PM and 1:30-4:00 PM at the office of Dr. Bo Merino, [facility].   Please be advised, all patients with office appointments requiring lab work will take precedents over walk-in lab work.  If possible, please come for your lab work on Monday and Friday afternoons, as you may experience shorter wait times. The office is located at [address] [place], [place], [place] [address] No appointment is necessary.   Labs are drawn by [facility]. Please bring your co-pay at the time of your lab draw.  You may receive a bill from [facility] for your lab work.  If you wish to have your labs drawn at another location, please call the office 24 hours in advance to send orders.  If you have any questions regarding directions or hours of operation,  please call [phone].   As a reminder, please drink plenty of water prior to coming for your lab work. Thanks!   Sarilumab injection What is this medicine? SARILUMAB (sar IL ue mab) is used to treat adults with rheumatoid arthritis (RA). This medicine helps reduce joint pain and swelling. This medicine is often used with other medicines. This medicine may be used for other purposes; ask your health care provider or pharmacist if you have questions. COMMON BRAND NAME(S): KEVZARA What should I tell my health care provider before I take this medicine? They need to know if you have any of these conditions:  cancer  diabetes  diverticulitis  hepatitis B or history of hepatitis B infection  high cholesterol  immune system  problems  infection (especially a virus infection such as chickenpox, cold sores, or herpes)  liver disease  low blood counts, like low white cell, platelets, or red cell counts  recently received or scheduled to receive a vaccine  scheduled to have surgery  stomach or intestinal problems  tuberculosis, a positive skin test for tuberculosis or have recently been in close contact with someone who has tuberculosis  an unusual or allergic reaction to sarilumab, other medicines, foods, dyes or preservatives  pregnant or trying to get pregnant  breast-feeding How should I use this medicine? This medicine is for injection under the skin. You will be taught how to prepare and give this medicine. Use exactly as directed. Take your medicine at regular intervals. Do not take your medicine more often than directed. It is important that you put your used needles, syringes, or pens in a special sharps container. Do not put them in a trash can. If you do not have a sharps container, call your pharmacist or healthcare provider to get one. A special MedGuide will be given to you by the pharmacist with each prescription and refill. Be sure to read this information carefully each time. Talk to your pediatrician regarding the use of this medicine in children. Special care may be needed. Overdosage: If you think you have taken too much of this medicine contact a poison control center or emergency room at once. NOTE: This medicine is only for you. Do not share this medicine with others. What if I  miss a dose? If you miss a dose, take it as soon as you can. If it is almost time for your next dose, take only that dose. Do not take double or extra doses. What may interact with this medicine? Do not take this medicine with any of the following medications:  live virus vaccines This medicine may also interact with the following medications:  biologic medicines such as abatacept, adalimumab, anakinra,  certolizumab, etanercept, golimumab, infliximab, ofatumumab, rituximab, secukinumab, tocilizumab, tofacitinib, and ustekinumab  birth control pills  certain medicines for cholesterol like atorvastatin, lovastatin, and simvastatin  certain medicines that treat or prevent blood clots like warfarin, apixaban, and rivaroxaban  medicines that lower your chance of fighting infection  steroid medicines like prednisone or cortisone  theophylline This list may not describe all possible interactions. Give your health care provider a list of all the medicines, herbs, non-prescription drugs, or dietary supplements you use. Also tell them if you smoke, drink alcohol, or use illegal drugs. Some items may interact with your medicine. What should I watch for while using this medicine? Tell your doctor or healthcare professional if your symptoms do not start to get better or if they get worse. You may need blood work done while you are taking this medicine. This medicine may increase your risk of getting an infection. Call your health care professional for advice if you get a fever, chills, or sore throat, or other symptoms of a cold or flu. Do not treat yourself. This medicine decreases your body's ability to fight infections. Try to avoid being around people who are sick. Call your health care professional if you are around anyone with measles, chickenpox, or if you develop sores or blisters that do not heal properly. Talk to your health care professional if you have not had chickenpox or the vaccine for chickenpox. If you are going to need surgery or other procedure, tell your health care professional that you are using this medicine. Talk to your doctor about your risk of cancer. You may be at risk for certain types of cancers if you take this medicine. What side effects may I notice from receiving this medicine? Side effects that you should report to your doctor or health care professional as soon as  possible:  allergic reactions like skin rash, itching or hives, swelling of the face, lips, or tongue  breathing problems  chest pain  feeling faint or lightheaded, falls  low blood counts - this medicine may decrease the number of white blood cells, red blood cells, and platelets. You may be at increased risk for infections and bleeding  signs and symptoms of infection like fever or chills; cough; sore throat; pain or trouble passing urine  signs and symptoms of liver injury like dark yellow or brown urine; general ill feeling or flu-like symptoms; light-colored stools; loss of appetite; nausea; right upper belly pain; unusually weak or tired; yellowing of the eyes or skin  sudden abdominal pain. vomiting Side effects that usually do not require medical attention (report these to your doctor or health care professional if they continue or are bothersome):  pain, redness, or irritation at site where injected  runny or stuffy nose This list may not describe all possible side effects. Call your doctor for medical advice about side effects. You may report side effects to FDA at 1-800-FDA-1088. Where should I keep my medicine? Keep out of the reach of children. Store in the prefilled syringes or pens in the refrigerator at 2 to  8 degrees C (36 to 46 degrees F). Do not freeze. Do not shake. Keep in the original container to protect from light. Throw away any unopened and unused medicine that has been stored in the refrigerator after the expiration date. If needed, the syringe or pen may be stored at room temperature up to 25 degrees C (77 degrees F) for up to 14 days. Keep in the original carton. Do not shake. Throw away any unused medicine that is stored at room temperature after 14 days. NOTE: This sheet is a summary. It may not cover all possible information. If you have questions about this medicine, talk to your doctor, pharmacist, or health care provider.  2021 Elsevier/Gold Standard  (2019-01-27 18:56:57)

## 2020-11-20 ENCOUNTER — Other Ambulatory Visit: Payer: Self-pay | Admitting: Physician Assistant

## 2020-11-20 LAB — COMPLETE METABOLIC PANEL WITH GFR
AG Ratio: 1.3 (calc) (ref 1.0–2.5)
ALT: 21 U/L (ref 6–29)
AST: 18 U/L (ref 10–35)
Albumin: 3.7 g/dL (ref 3.6–5.1)
Alkaline phosphatase (APISO): 106 U/L (ref 37–153)
BUN/Creatinine Ratio: 22 (calc) (ref 6–22)
BUN: 29 mg/dL — ABNORMAL HIGH (ref 7–25)
CO2: 30 mmol/L (ref 20–32)
Calcium: 9.4 mg/dL (ref 8.6–10.4)
Chloride: 100 mmol/L (ref 98–110)
Creat: 1.3 mg/dL — ABNORMAL HIGH (ref 0.60–0.93)
GFR, Est African American: 47 mL/min/{1.73_m2} — ABNORMAL LOW (ref >=60)
GFR, Est Non African American: 40 mL/min/{1.73_m2} — ABNORMAL LOW (ref >=60)
Globulin: 2.9 g/dL (calc) (ref 1.9–3.7)
Glucose, Bld: 99 mg/dL (ref 65–99)
Potassium: 4.1 mmol/L (ref 3.5–5.3)
Sodium: 141 mmol/L (ref 135–146)
Total Bilirubin: 0.4 mg/dL (ref 0.2–1.2)
Total Protein: 6.6 g/dL (ref 6.1–8.1)

## 2020-11-20 LAB — CBC WITH DIFFERENTIAL/PLATELET
Absolute Monocytes: 901 cells/uL (ref 200–950)
Basophils Absolute: 46 cells/uL (ref 0–200)
Basophils Relative: 0.4 %
Eosinophils Absolute: 194 cells/uL (ref 15–500)
Eosinophils Relative: 1.7 %
HCT: 37.4 % (ref 35.0–45.0)
Hemoglobin: 12.4 g/dL (ref 11.7–15.5)
Lymphs Abs: 1744 cells/uL (ref 850–3900)
MCH: 29.6 pg (ref 27.0–33.0)
MCHC: 33.2 g/dL (ref 32.0–36.0)
MCV: 89.3 fL (ref 80.0–100.0)
MPV: 9.7 fL (ref 7.5–12.5)
Monocytes Relative: 7.9 %
Neutro Abs: 8516 cells/uL — ABNORMAL HIGH (ref 1500–7800)
Neutrophils Relative %: 74.7 %
Platelets: 357 10*3/uL (ref 140–400)
RBC: 4.19 10*6/uL (ref 3.80–5.10)
RDW: 15.1 % — ABNORMAL HIGH (ref 11.0–15.0)
Total Lymphocyte: 15.3 %
WBC: 11.4 10*3/uL — ABNORMAL HIGH (ref 3.8–10.8)

## 2020-11-22 NOTE — Progress Notes (Signed)
WBC count is mildly elevated, likely due to prednisone use.  Please clarify if she has had any recent infections.   Creatinine remains elevated-1.30 and GFR low-47.  She is taking diclofenac 75 mg BID.  She has been strongly encouraged to discontinue diclofenac due to renal function and long term prednisone use with known colonic ulcer.  She has been unable to discontinue diclofenac.  Please advise her to follow up with pain management regarding other options besides NSAIDs.   We are in the process of applying for kevzara as discussed at her office visit on 11/19/20.

## 2020-11-24 ENCOUNTER — Encounter (INDEPENDENT_AMBULATORY_CARE_PROVIDER_SITE_OTHER): Payer: Self-pay

## 2020-11-24 DIAGNOSIS — M48062 Spinal stenosis, lumbar region with neurogenic claudication: Secondary | ICD-10-CM | POA: Diagnosis not present

## 2020-11-24 DIAGNOSIS — N183 Chronic kidney disease, stage 3 unspecified: Secondary | ICD-10-CM | POA: Diagnosis not present

## 2020-11-24 DIAGNOSIS — I130 Hypertensive heart and chronic kidney disease with heart failure and stage 1 through stage 4 chronic kidney disease, or unspecified chronic kidney disease: Secondary | ICD-10-CM | POA: Diagnosis not present

## 2020-11-24 DIAGNOSIS — I5042 Chronic combined systolic (congestive) and diastolic (congestive) heart failure: Secondary | ICD-10-CM | POA: Diagnosis not present

## 2020-11-24 DIAGNOSIS — M961 Postlaminectomy syndrome, not elsewhere classified: Secondary | ICD-10-CM | POA: Diagnosis not present

## 2020-11-24 DIAGNOSIS — G894 Chronic pain syndrome: Secondary | ICD-10-CM | POA: Diagnosis not present

## 2020-11-25 NOTE — Telephone Encounter (Signed)
Submitted Patient Assistance Application to [place] for [facility] along with provider portion, signed patient portion, and income documents. Will update patient when we receive a response.  Fax# [phone] Phone# [phone]  Knox Saliva, PharmD, MPH Clinical Pharmacist (Rheumatology and Pulmonology)

## 2020-11-29 NOTE — Telephone Encounter (Signed)
Re-faxed [employer] application with rx date, diagnosis code, and Personnel officer.  Fax [phone] Phone [phone]  Knox Saliva, PharmD, MPH Clinical Pharmacist (Rheumatology and Pulmonology)

## 2020-12-02 DIAGNOSIS — N183 Chronic kidney disease, stage 3 unspecified: Secondary | ICD-10-CM | POA: Diagnosis not present

## 2020-12-02 DIAGNOSIS — M150 Primary generalized (osteo)arthritis: Secondary | ICD-10-CM | POA: Diagnosis not present

## 2020-12-02 DIAGNOSIS — G894 Chronic pain syndrome: Secondary | ICD-10-CM | POA: Diagnosis not present

## 2020-12-02 DIAGNOSIS — M961 Postlaminectomy syndrome, not elsewhere classified: Secondary | ICD-10-CM | POA: Diagnosis not present

## 2020-12-02 DIAGNOSIS — I130 Hypertensive heart and chronic kidney disease with heart failure and stage 1 through stage 4 chronic kidney disease, or unspecified chronic kidney disease: Secondary | ICD-10-CM | POA: Diagnosis not present

## 2020-12-02 DIAGNOSIS — M48061 Spinal stenosis, lumbar region without neurogenic claudication: Secondary | ICD-10-CM | POA: Diagnosis not present

## 2020-12-02 DIAGNOSIS — I5042 Chronic combined systolic (congestive) and diastolic (congestive) heart failure: Secondary | ICD-10-CM | POA: Diagnosis not present

## 2020-12-02 DIAGNOSIS — M48062 Spinal stenosis, lumbar region with neurogenic claudication: Secondary | ICD-10-CM | POA: Diagnosis not present

## 2020-12-02 DIAGNOSIS — R252 Cramp and spasm: Secondary | ICD-10-CM | POA: Diagnosis not present

## 2020-12-03 DIAGNOSIS — M48062 Spinal stenosis, lumbar region with neurogenic claudication: Secondary | ICD-10-CM | POA: Diagnosis not present

## 2020-12-03 DIAGNOSIS — N183 Chronic kidney disease, stage 3 unspecified: Secondary | ICD-10-CM | POA: Diagnosis not present

## 2020-12-03 DIAGNOSIS — M961 Postlaminectomy syndrome, not elsewhere classified: Secondary | ICD-10-CM | POA: Diagnosis not present

## 2020-12-03 DIAGNOSIS — I130 Hypertensive heart and chronic kidney disease with heart failure and stage 1 through stage 4 chronic kidney disease, or unspecified chronic kidney disease: Secondary | ICD-10-CM | POA: Diagnosis not present

## 2020-12-03 DIAGNOSIS — I5042 Chronic combined systolic (congestive) and diastolic (congestive) heart failure: Secondary | ICD-10-CM | POA: Diagnosis not present

## 2020-12-03 DIAGNOSIS — G894 Chronic pain syndrome: Secondary | ICD-10-CM | POA: Diagnosis not present

## 2020-12-03 MED ORDER — KEVZARA 200 MG/1.14ML ~~LOC~~ SOSY: PREFILLED_SYRINGE | SUBCUTANEOUS | 0 refills | Status: DC

## 2020-12-03 MED ORDER — KEVZARA 200 MG/1.14ML ~~LOC~~ SOAJ: 200.0000 mg | SUBCUTANEOUS | 0 refills | Status: DC

## 2020-12-03 NOTE — Addendum Note (Signed)
Addended by: Cassandria Anger on: 12/03/2020 03:30 PM   Modules accepted: Orders

## 2020-12-03 NOTE — Telephone Encounter (Signed)
Rx sent to [facility] for 4 pens (56 days). Advised patient that we will schedule shipment of medication to clinic then send her home with the rest of the medications. Advised that she can bring her caregiver with her if they will be administering the medication.  Advised by [facility] again that copay is $68 for 8 week supply. Rx re-sent for pens instead of pre-filled syringes. Confirmed delivery address to clinic.  Will f/u on Tuesday 12/07/20.  Knox Saliva, PharmD, MPH Clinical Pharmacist (Rheumatology and Pulmonology)

## 2020-12-03 NOTE — Telephone Encounter (Signed)
Received notification from [employer]. Patient has been denied patient assistance application due to insurance coverage through [place] - patient is also not eligible for [facility] copay card. Will send rx to [facility] to see if copay is cheaper than $68.

## 2020-12-03 NOTE — Addendum Note (Signed)
Addended by: Cassandria Anger on: 12/03/2020 08:30 AM   Modules accepted: Orders

## 2020-12-04 DIAGNOSIS — N183 Chronic kidney disease, stage 3 unspecified: Secondary | ICD-10-CM | POA: Diagnosis not present

## 2020-12-04 DIAGNOSIS — I130 Hypertensive heart and chronic kidney disease with heart failure and stage 1 through stage 4 chronic kidney disease, or unspecified chronic kidney disease: Secondary | ICD-10-CM | POA: Diagnosis not present

## 2020-12-04 DIAGNOSIS — M961 Postlaminectomy syndrome, not elsewhere classified: Secondary | ICD-10-CM | POA: Diagnosis not present

## 2020-12-04 DIAGNOSIS — M6281 Muscle weakness (generalized): Secondary | ICD-10-CM | POA: Diagnosis not present

## 2020-12-04 DIAGNOSIS — M48062 Spinal stenosis, lumbar region with neurogenic claudication: Secondary | ICD-10-CM | POA: Diagnosis not present

## 2020-12-04 DIAGNOSIS — I5042 Chronic combined systolic (congestive) and diastolic (congestive) heart failure: Secondary | ICD-10-CM | POA: Diagnosis not present

## 2020-12-04 DIAGNOSIS — R262 Difficulty in walking, not elsewhere classified: Secondary | ICD-10-CM | POA: Diagnosis not present

## 2020-12-04 DIAGNOSIS — M353 Polymyalgia rheumatica: Secondary | ICD-10-CM | POA: Diagnosis not present

## 2020-12-04 DIAGNOSIS — Z6841 Body Mass Index (BMI) 40.0 and over, adult: Secondary | ICD-10-CM | POA: Diagnosis not present

## 2020-12-04 DIAGNOSIS — G894 Chronic pain syndrome: Secondary | ICD-10-CM | POA: Diagnosis not present

## 2020-12-06 DIAGNOSIS — G894 Chronic pain syndrome: Secondary | ICD-10-CM | POA: Diagnosis not present

## 2020-12-06 DIAGNOSIS — M48062 Spinal stenosis, lumbar region with neurogenic claudication: Secondary | ICD-10-CM | POA: Diagnosis not present

## 2020-12-06 DIAGNOSIS — I5042 Chronic combined systolic (congestive) and diastolic (congestive) heart failure: Secondary | ICD-10-CM | POA: Diagnosis not present

## 2020-12-06 DIAGNOSIS — M961 Postlaminectomy syndrome, not elsewhere classified: Secondary | ICD-10-CM | POA: Diagnosis not present

## 2020-12-06 DIAGNOSIS — I130 Hypertensive heart and chronic kidney disease with heart failure and stage 1 through stage 4 chronic kidney disease, or unspecified chronic kidney disease: Secondary | ICD-10-CM | POA: Diagnosis not present

## 2020-12-06 DIAGNOSIS — N183 Chronic kidney disease, stage 3 unspecified: Secondary | ICD-10-CM | POA: Diagnosis not present

## 2020-12-07 ENCOUNTER — Telehealth: Payer: Self-pay

## 2020-12-07 MED ORDER — KEVZARA 200 MG/1.14ML ~~LOC~~ SOAJ: 200.0000 mg | SUBCUTANEOUS | 0 refills | Status: DC

## 2020-12-07 NOTE — Telephone Encounter (Signed)
Called [facility] pharmacy for [employer] order status. Pharmacy cancelled pen rx and kept syringe. Requested syringe rx be cancelled. Resent Kevzara pen rx with note to discontinue syringe rx. Also requested that pharmacy call patient to collect copay.  Will f/u tomorrow 12/08/20  Knox Saliva, PharmD, MPH Clinical Pharmacist (Rheumatology and Pulmonology)

## 2020-12-07 NOTE — Telephone Encounter (Signed)
Patient called stating she forgot to mention that she is planning on getting her booster vaccine in March and isn't sure if this will affect when she starts her new medication.

## 2020-12-07 NOTE — Telephone Encounter (Signed)
Returned patient's call regarding Nicole [name]. Medication does not need to be held or dosing altered based on COVID19 vaccine.  Advised patient about [facility] - cost is $68 for 8 weeks. Advised that pharmacy will reach out to confirm that medication needs to be shipped to clinic as well as collect copay.  Patient verbalized understanding.  Knox Saliva, PharmD, MPH Clinical Pharmacist (Rheumatology and Pulmonology)

## 2020-12-07 NOTE — Addendum Note (Signed)
Addended by: Cassandria Anger on: 12/07/2020 08:28 AM   Modules accepted: Orders

## 2020-12-08 DIAGNOSIS — I130 Hypertensive heart and chronic kidney disease with heart failure and stage 1 through stage 4 chronic kidney disease, or unspecified chronic kidney disease: Secondary | ICD-10-CM | POA: Diagnosis not present

## 2020-12-08 DIAGNOSIS — M48062 Spinal stenosis, lumbar region with neurogenic claudication: Secondary | ICD-10-CM | POA: Diagnosis not present

## 2020-12-08 DIAGNOSIS — G894 Chronic pain syndrome: Secondary | ICD-10-CM | POA: Diagnosis not present

## 2020-12-08 DIAGNOSIS — N183 Chronic kidney disease, stage 3 unspecified: Secondary | ICD-10-CM | POA: Diagnosis not present

## 2020-12-08 DIAGNOSIS — I5042 Chronic combined systolic (congestive) and diastolic (congestive) heart failure: Secondary | ICD-10-CM | POA: Diagnosis not present

## 2020-12-08 DIAGNOSIS — M961 Postlaminectomy syndrome, not elsewhere classified: Secondary | ICD-10-CM | POA: Diagnosis not present

## 2020-12-09 NOTE — Telephone Encounter (Signed)
Called [facility] to check the status of Kevzara shipment,was transferred to fulfillment department who stated they didn't patient consent to ship med to office.  Called patient and conference in with [place], shipment confirmed to office - should deliver Friday 12/10/20, or Tuesday 12/14/20.  Advised patient, that office will contact her once medication is received to schedule [facility] visit, then she will call to set up her travel to the office.

## 2020-12-13 DIAGNOSIS — G894 Chronic pain syndrome: Secondary | ICD-10-CM | POA: Diagnosis not present

## 2020-12-13 DIAGNOSIS — M48062 Spinal stenosis, lumbar region with neurogenic claudication: Secondary | ICD-10-CM | POA: Diagnosis not present

## 2020-12-13 DIAGNOSIS — N183 Chronic kidney disease, stage 3 unspecified: Secondary | ICD-10-CM | POA: Diagnosis not present

## 2020-12-13 DIAGNOSIS — I5042 Chronic combined systolic (congestive) and diastolic (congestive) heart failure: Secondary | ICD-10-CM | POA: Diagnosis not present

## 2020-12-13 DIAGNOSIS — M961 Postlaminectomy syndrome, not elsewhere classified: Secondary | ICD-10-CM | POA: Diagnosis not present

## 2020-12-13 DIAGNOSIS — I130 Hypertensive heart and chronic kidney disease with heart failure and stage 1 through stage 4 chronic kidney disease, or unspecified chronic kidney disease: Secondary | ICD-10-CM | POA: Diagnosis not present

## 2020-12-14 ENCOUNTER — Telehealth: Payer: Self-pay

## 2020-12-14 NOTE — Telephone Encounter (Signed)
Called [facility], patient's shipment was held due to $21 remaining balance for her copay.  Conferenced patient into call with pharmacy. Patient made $68 copay payment on 2/16 and the pharmacy's accounting department deducted $21 for an outstanding balance, resulting in her shipment being held.  Call was escalated to a supervisor, patient made additional $21 payment. Shipment will be expedited, rep Butch Penny could not provide estimated delivery date.   Will call tomorrow to check on shipment status and get tracking information if possible.

## 2020-12-14 NOTE — Telephone Encounter (Signed)
Patient left a voicemail stating she was "under the impression" that her new medicine should have been delivered to the office yesterday.  Patient requested a return call.

## 2020-12-14 NOTE — Telephone Encounter (Signed)
Addressed in previous encounter.  Closing encounter.

## 2020-12-15 NOTE — Telephone Encounter (Signed)
Called [facility], patient's order is still in process. Manager Ann sent request to rush shipment asap. Should deliver to clinic tomorrow or Friday the latest.  Will call tomorrow to check status.

## 2020-12-16 DIAGNOSIS — M961 Postlaminectomy syndrome, not elsewhere classified: Secondary | ICD-10-CM | POA: Diagnosis not present

## 2020-12-16 DIAGNOSIS — I130 Hypertensive heart and chronic kidney disease with heart failure and stage 1 through stage 4 chronic kidney disease, or unspecified chronic kidney disease: Secondary | ICD-10-CM | POA: Diagnosis not present

## 2020-12-16 DIAGNOSIS — N183 Chronic kidney disease, stage 3 unspecified: Secondary | ICD-10-CM | POA: Diagnosis not present

## 2020-12-16 DIAGNOSIS — I5042 Chronic combined systolic (congestive) and diastolic (congestive) heart failure: Secondary | ICD-10-CM | POA: Diagnosis not present

## 2020-12-16 DIAGNOSIS — M48062 Spinal stenosis, lumbar region with neurogenic claudication: Secondary | ICD-10-CM | POA: Diagnosis not present

## 2020-12-16 DIAGNOSIS — G894 Chronic pain syndrome: Secondary | ICD-10-CM | POA: Diagnosis not present

## 2020-12-16 NOTE — Telephone Encounter (Signed)
Called [facility], manager Vicente Males advised that patient's Nicole [name] was shipped this morning at 10am. She provided tracking number and confirmed it was processed to mail to [facility] office.  [employer] tracking# [id number]  Called patient and advised office will call to schedule appointment once we receive her medication. Will track shipment in the morning.

## 2020-12-17 NOTE — Telephone Encounter (Signed)
Tracked shipment, label has been created but has not left the pharmacy. Will likely ship on Monday. Will continue to track shipment.

## 2020-12-20 ENCOUNTER — Other Ambulatory Visit: Payer: Self-pay | Admitting: Physician Assistant

## 2020-12-20 DIAGNOSIS — G894 Chronic pain syndrome: Secondary | ICD-10-CM | POA: Diagnosis not present

## 2020-12-20 DIAGNOSIS — I5042 Chronic combined systolic (congestive) and diastolic (congestive) heart failure: Secondary | ICD-10-CM | POA: Diagnosis not present

## 2020-12-20 DIAGNOSIS — M48062 Spinal stenosis, lumbar region with neurogenic claudication: Secondary | ICD-10-CM | POA: Diagnosis not present

## 2020-12-20 DIAGNOSIS — N183 Chronic kidney disease, stage 3 unspecified: Secondary | ICD-10-CM | POA: Diagnosis not present

## 2020-12-20 DIAGNOSIS — I130 Hypertensive heart and chronic kidney disease with heart failure and stage 1 through stage 4 chronic kidney disease, or unspecified chronic kidney disease: Secondary | ICD-10-CM | POA: Diagnosis not present

## 2020-12-20 DIAGNOSIS — M961 Postlaminectomy syndrome, not elsewhere classified: Secondary | ICD-10-CM | POA: Diagnosis not present

## 2020-12-20 NOTE — Telephone Encounter (Addendum)
Received [employer] shipment of 2 boxes (4 pens) from [employer]. Placed in clinic fridge.  Patient scheduled for Kevzara new stat 12/21/20 @ 10:30am with pharmacy clinic.  Knox Saliva, PharmD, MPH Clinical Pharmacist (Rheumatology and Pulmonology)

## 2020-12-21 ENCOUNTER — Ambulatory Visit: Payer: Medicare Other | Admitting: Pharmacist

## 2020-12-21 DIAGNOSIS — Z79899 Other long term (current) drug therapy: Secondary | ICD-10-CM

## 2020-12-21 DIAGNOSIS — M0579 Rheumatoid arthritis with rheumatoid factor of multiple sites without organ or systems involvement: Secondary | ICD-10-CM

## 2020-12-21 DIAGNOSIS — M353 Polymyalgia rheumatica: Secondary | ICD-10-CM

## 2020-12-21 NOTE — Patient Instructions (Addendum)
Next dose Kevzara dose is due 3/15, then 3/29, then 4/12, then every 2 weeks after. Mark in your calendar.  We will draw labs at your follow-up visit with Hazel Sams! Please monitor for signs of diverticulitis as you start this medicine.  Remember the 5 C's:  COUNTER - leave on the counter at least 30 minutes but up to overnight to bring medication to room temperature. This may help prevent stinging  COLD - place something cold (like an ice gel pack or cold water bottle) on the injection site just before cleansing with alcohol. This may help reduce pain  CLARITIN - use Claritin (generic name is loratadine) for the first two weeks of treatment or the day of, the day before, and the day after injecting. This will help to minimize injection site reactions  CORTISONE CREAM - apply if injection site is irritated and itching  CALL ME - if injection site reaction is bigger than the size of your fist, looks infected, blisters, or if you develop hives  Standing Labs Please have your standing labs drawn in 1 month then every 3 months  We have open lab daily Monday through Thursday from 1:30-4:30 PM and Friday from 1:30-4:00 PM at the office of Dr. Bo Merino, [facility].   Please be advised, all patients with office appointments requiring lab work will take precedents over walk-in lab work.  If possible, please come for your lab work on Monday and Friday afternoons, as you may experience shorter wait times. The office is located at [address] [place], [place], [place] [address] No appointment is necessary.   Labs are drawn by [facility]. Please bring your co-pay at the time of your lab draw.  You may receive a bill from [facility] for your lab work.  If you wish to have your labs drawn at another location, please call the office 24 hours in advance to send orders.  If you have any questions regarding directions or hours of operation,  please call [phone].   As a reminder,  please drink plenty of water prior to coming for your lab work. Thanks!

## 2020-12-21 NOTE — Telephone Encounter (Signed)
Last Visit: 11/19/2020 Next Visit: 01/20/2021  Current Dose per office note on 11/19/2020, prednisone 5 mg 2 tablets daily Dx:  Polymyalgia rheumatica   Last Fill:09/20/2020  Okay to refill Prednisone?

## 2020-12-21 NOTE — Progress Notes (Signed)
Pharmacy Note  Subjective:   Nicole [name] presents to clinic today to receive first dose of Kevzara.  Patient presents with a caregiver, Romie Minus. PMH significant for PMR, RA, and temporal arteritis. Plan was for patient to start Actemra, however given Actemra supply shortage d/t use in [place], we are moving forward with Darcus Pester which has the same mechanism of action as Actemra, though not well-studied in patients with hx of PMR.  Patient has history of diverticulitis which should be closely monitored. GI provider, Dr. Hildred Laser notified of these med changes.  Methotrexate was not an option given her kidney function.   Patient running a fever or have signs/symptoms of infection? No  Patient currently on antibiotics for the treatment of infection? No  Patient have any upcoming invasive procedures/surgeries? No  Objective: ANC >1000, platelets wnl, and LFTs wnl  CMP     Component Value Date/Time   NA 141 11/19/2020 1101   NA 139 10/30/2019 1249   K 4.1 11/19/2020 1101   CL 100 11/19/2020 1101   CO2 30 11/19/2020 1101   GLUCOSE 99 11/19/2020 1101   BUN 29 (H) 11/19/2020 1101   BUN 14 10/30/2019 1249   CREATININE 1.30 (H) 11/19/2020 1101   CALCIUM 9.4 11/19/2020 1101   PROT 6.6 11/19/2020 1101   PROT 7.1 10/30/2019 1249   ALBUMIN 4.1 10/30/2019 1249   AST 18 11/19/2020 1101   ALT 21 11/19/2020 1101   ALKPHOS 158 (H) 10/30/2019 1249   BILITOT 0.4 11/19/2020 1101   BILITOT 0.3 10/30/2019 1249   GFRNONAA 40 (L) 11/19/2020 1101   GFRAA 47 (L) 11/19/2020 1101    CBC    Component Value Date/Time   WBC 11.4 (H) 11/19/2020 1101   RBC 4.19 11/19/2020 1101   HGB 12.4 11/19/2020 1101   HGB 12.7 10/30/2019 1249   HCT 37.4 11/19/2020 1101   HCT 38.2 10/30/2019 1249   PLT 357 11/19/2020 1101   PLT 359 10/30/2019 1249   MCV 89.3 11/19/2020 1101   MCV 90 10/30/2019 1249   MCH 29.6 11/19/2020 1101   MCHC 33.2 11/19/2020 1101   RDW 15.1 (H) 11/19/2020 1101   RDW 14.5 10/30/2019 1249    LYMPHSABS 1,744 11/19/2020 1101   LYMPHSABS 2.5 10/30/2019 1249   MONOABS 1.1 (H) 05/01/2018 0619   EOSABS 194 11/19/2020 1101   EOSABS 0.2 10/30/2019 1249   BASOSABS 46 11/19/2020 1101   BASOSABS 0.1 10/30/2019 1249    Baseline Immunosuppressant Therapy Labs TB GOLD Quantiferon TB Gold Latest Ref Rng & Units 09/09/2020  Quantiferon TB Gold Plus NEGATIVE NEGATIVE   Hepatitis Panel Hepatitis Latest Ref Rng & Units 09/09/2020  Hep B Surface Ag NON-REACTI NON-REACTIVE  Hep B IgM NON-REACTI NON-REACTIVE  Hep C Ab NON-REACTI NON-REACTIVE  Hep C Ab NON-REACTI NON-REACTIVE   HIV Lab Results  Component Value Date   HIV NON-REACTIVE 09/09/2020   Immunoglobulins Immunoglobulin Electrophoresis Latest Ref Rng & Units 09/09/2020  IgA  70 - 320 mg/dL 269  IgG 600 - 1,540 mg/dL 868  IgM 50 - 300 mg/dL 242   SPEP Serum Protein Electrophoresis Latest Ref Rng & Units 11/19/2020  Total Protein 6.1 - 8.1 g/dL 6.6  Albumin 3.8 - 4.8 g/dL -  Alpha-1 0.2 - 0.3 g/dL -  Alpha-2 0.5 - 0.9 g/dL -  Beta Globulin 0.4 - 0.6 g/dL -  Beta 2 0.2 - 0.5 g/dL -  Gamma Globulin 0.8 - 1.7 g/dL -   Chest x-ray: 04/16/18 "Cardiomegaly with mild bilateral  pulmonary interstitial prominence suggesting interstitial edema/CHF."  Assessment/Plan:  Counseled patient that Darcus Pester is an IL-6 blocking agent.  Counseled patient on purpose, proper use, and adverse effects of Kevzara.  Reviewed the most common adverse effects including infections, injection site reaction, bowel injury, and rarely cancer.  Reviewed that the medication should be held during infections.  Discussed that there is the possibility of an increased risk of malignancy but it is not well understood if this increased risk is due to the medication or the disease state.  Counseled patient that Darcus Pester should be held prior to scheduled surgery.    Reviewed the importance of regular labs while on Kevzara therapy including the need for routine lipid  panel.  Standing orders placed.  Provided patient with medication education material and answered all questions.  Patient voiced understanding.  Patient consented to [facility].  Will upload consent into the media tab.  Reviewed storage instructions of Darcus Pester with patient.  Advised it should be kept in the refrigerator until she is ready for use.  Advised patient that initial Kevzara injection must be given in the office.  Will apply for Kevzara through patient's insurance.    Patient dose will be Kevzara 200 mg every 14 days.  Prescription pending lab results and/or insurance approval.  Demonstrated proper injection technique with [employer] demo device.  Patient's caregiver, Romie Minus, able to demonstrate proper injection technique using the teach back method.  Patient injected in the left abdomen with:  Sample Medication: Kevzara 200mg /1.14 mL NDC: 6144-3154-00 Lot: [id number] Expiration: 02/19/21  Patient tolerated well.  Observed for 30 mins in office for adverse reaction and tolerability.   Patient's dose will be Kevzara 200mg  (1 pen) every 14 days. Next dose due 3/15, 3/29, then every 14 days thereafter. Patient and caregiver provided with year calendar with circled dates when doses are due. Patient/caregiver will mark in her calendar at home.  She will continue leflunomide 10 mg daily, and continue prednisone 10 mg daily until f/u. We discussed risk of diclofenac and NSAID use with hx of diverticulitis as well as additional risk with Kevzara. Recommended minimizing use of diclofenac and monitor for signs of diverticulitis. Emphasized that the Darcus Pester is a risk vs benefit option and we will have to monitor neutrophils, platelets, and liver function closely. Patient verbalized understanding.    Patient is to return in 1 month for labs. R/u with Hazel Sams, PA-C scheduled .  Standing orders for CBC with diff/platelet and CMP with GFR already in place.   Kevzara approved through insurance.  Copay is $68 for  56 days (8 weeks). Patient did not qualify for patient assistance d/t [employer] coverage. Patient's first shipment of 4 pens (56 days supply) was sent to clinic by pharmacy. Patient provided with cooler with 4 pens + 1 sample pen (enough doses through 03/01/21 dose). Rx should be sent to [facility] for 8 weeks whenever possible for cost-effectiveness.  F/u visit with Hazel Sams, PA-C scheduled on 01/20/21.  Will draw labs at that visit. All questions encouraged and answered.  Instructed patient to call with any further questions or concerns.  Knox Saliva, PharmD, MPH Clinical Pharmacist (Rheumatology and Pulmonology)

## 2020-12-22 DIAGNOSIS — N183 Chronic kidney disease, stage 3 unspecified: Secondary | ICD-10-CM | POA: Diagnosis not present

## 2020-12-22 DIAGNOSIS — M961 Postlaminectomy syndrome, not elsewhere classified: Secondary | ICD-10-CM | POA: Diagnosis not present

## 2020-12-22 DIAGNOSIS — M48062 Spinal stenosis, lumbar region with neurogenic claudication: Secondary | ICD-10-CM | POA: Diagnosis not present

## 2020-12-22 DIAGNOSIS — I5042 Chronic combined systolic (congestive) and diastolic (congestive) heart failure: Secondary | ICD-10-CM | POA: Diagnosis not present

## 2020-12-22 DIAGNOSIS — I130 Hypertensive heart and chronic kidney disease with heart failure and stage 1 through stage 4 chronic kidney disease, or unspecified chronic kidney disease: Secondary | ICD-10-CM | POA: Diagnosis not present

## 2020-12-22 DIAGNOSIS — G894 Chronic pain syndrome: Secondary | ICD-10-CM | POA: Diagnosis not present

## 2020-12-26 ENCOUNTER — Other Ambulatory Visit: Payer: Self-pay | Admitting: Physician Assistant

## 2020-12-27 NOTE — Telephone Encounter (Signed)
Last Visit: 11/19/2020 Next Visit: 01/20/2021 Labs: 11/19/2020, WBC count is mildly elevated, likely due to prednisone use. Please clarify if she has had any recent infections.  Creatinine remains elevated-1.30 and GFR low-47. She is taking diclofenac 75 mg BID. She has been strongly encouraged to discontinue diclofenac due to renal function and long term prednisone use with known colonic ulcer. She has been unable to discontinue diclofenac. Please advise her to follow up with pain management regarding other options besides NSAIDs.   We are in the process of applying for kevzara as discussed at her office visit on 11/19/20.   Current Dose per office note 11/19/2020, arava 10 mg 1 tablet by mouth daily DX:   Polymyalgia rheumatica   Last Fill: 09/20/2020  Okay to refill Arava?

## 2020-12-28 ENCOUNTER — Telehealth: Payer: Self-pay

## 2020-12-28 DIAGNOSIS — N183 Chronic kidney disease, stage 3 unspecified: Secondary | ICD-10-CM | POA: Diagnosis not present

## 2020-12-28 DIAGNOSIS — I5042 Chronic combined systolic (congestive) and diastolic (congestive) heart failure: Secondary | ICD-10-CM | POA: Diagnosis not present

## 2020-12-28 DIAGNOSIS — G894 Chronic pain syndrome: Secondary | ICD-10-CM | POA: Diagnosis not present

## 2020-12-28 DIAGNOSIS — I130 Hypertensive heart and chronic kidney disease with heart failure and stage 1 through stage 4 chronic kidney disease, or unspecified chronic kidney disease: Secondary | ICD-10-CM | POA: Diagnosis not present

## 2020-12-28 DIAGNOSIS — M961 Postlaminectomy syndrome, not elsewhere classified: Secondary | ICD-10-CM | POA: Diagnosis not present

## 2020-12-28 DIAGNOSIS — M48062 Spinal stenosis, lumbar region with neurogenic claudication: Secondary | ICD-10-CM | POA: Diagnosis not present

## 2020-12-28 NOTE — Telephone Encounter (Signed)
Lenell Antu, physical therapist at [facility] left a voicemail stating she is required to let the doctor know immediately that there is a level 2 warning regarding her Darcus Pester and its interaction with Leflunomide.  Marzetta Board requested a return call at [phone] to let her know how to proceed in educating the patient.

## 2020-12-28 NOTE — Telephone Encounter (Signed)
Patient is aware of the increased risk for immunosuppression on combination therapy.  She is aware that she is to hold both medications if she develops signs or symptoms of an infection.

## 2020-12-28 NOTE — Telephone Encounter (Signed)
I called Nicole [name] to advise.

## 2020-12-28 NOTE — Telephone Encounter (Signed)
Please advise 

## 2020-12-29 ENCOUNTER — Telehealth: Payer: Self-pay

## 2020-12-29 NOTE — Telephone Encounter (Signed)
Please call patient. Thank you.  

## 2020-12-29 NOTE — Telephone Encounter (Signed)
Returned patient's call regarding Darcus Pester. Advised we have communicated with Marzetta Board about her leflunomide and Kevzara interaction. Advised we will continue to monitor her labs closely as she takes both medications.   She reported pain when walking this past Friday, and I recommended that she reach out to her pain management provider for guidance.  Nothing further needed.  Knox Saliva, PharmD, MPH Clinical Pharmacist (Rheumatology and Pulmonology)

## 2020-12-29 NOTE — Telephone Encounter (Signed)
Patient called stating her therapist told her there is a warning taking the two medications Dr. Estanislado Pandy prescribed.  Patient requested a return call to let her know if she needs to discontinue taking the Leflunomide.

## 2020-12-30 DIAGNOSIS — G894 Chronic pain syndrome: Secondary | ICD-10-CM | POA: Diagnosis not present

## 2020-12-30 DIAGNOSIS — N183 Chronic kidney disease, stage 3 unspecified: Secondary | ICD-10-CM | POA: Diagnosis not present

## 2020-12-30 DIAGNOSIS — M961 Postlaminectomy syndrome, not elsewhere classified: Secondary | ICD-10-CM | POA: Diagnosis not present

## 2020-12-30 DIAGNOSIS — I130 Hypertensive heart and chronic kidney disease with heart failure and stage 1 through stage 4 chronic kidney disease, or unspecified chronic kidney disease: Secondary | ICD-10-CM | POA: Diagnosis not present

## 2020-12-30 DIAGNOSIS — M48062 Spinal stenosis, lumbar region with neurogenic claudication: Secondary | ICD-10-CM | POA: Diagnosis not present

## 2020-12-30 DIAGNOSIS — I5042 Chronic combined systolic (congestive) and diastolic (congestive) heart failure: Secondary | ICD-10-CM | POA: Diagnosis not present

## 2021-01-03 DIAGNOSIS — M961 Postlaminectomy syndrome, not elsewhere classified: Secondary | ICD-10-CM | POA: Diagnosis not present

## 2021-01-03 DIAGNOSIS — G894 Chronic pain syndrome: Secondary | ICD-10-CM | POA: Diagnosis not present

## 2021-01-03 DIAGNOSIS — N183 Chronic kidney disease, stage 3 unspecified: Secondary | ICD-10-CM | POA: Diagnosis not present

## 2021-01-03 DIAGNOSIS — M6281 Muscle weakness (generalized): Secondary | ICD-10-CM | POA: Diagnosis not present

## 2021-01-03 DIAGNOSIS — M353 Polymyalgia rheumatica: Secondary | ICD-10-CM | POA: Diagnosis not present

## 2021-01-03 DIAGNOSIS — Z6841 Body Mass Index (BMI) 40.0 and over, adult: Secondary | ICD-10-CM | POA: Diagnosis not present

## 2021-01-03 DIAGNOSIS — M48062 Spinal stenosis, lumbar region with neurogenic claudication: Secondary | ICD-10-CM | POA: Diagnosis not present

## 2021-01-03 DIAGNOSIS — I130 Hypertensive heart and chronic kidney disease with heart failure and stage 1 through stage 4 chronic kidney disease, or unspecified chronic kidney disease: Secondary | ICD-10-CM | POA: Diagnosis not present

## 2021-01-03 DIAGNOSIS — R262 Difficulty in walking, not elsewhere classified: Secondary | ICD-10-CM | POA: Diagnosis not present

## 2021-01-03 DIAGNOSIS — I5042 Chronic combined systolic (congestive) and diastolic (congestive) heart failure: Secondary | ICD-10-CM | POA: Diagnosis not present

## 2021-01-04 ENCOUNTER — Telehealth: Payer: Self-pay

## 2021-01-04 NOTE — Telephone Encounter (Signed)
Please advise 

## 2021-01-04 NOTE — Telephone Encounter (Signed)
Patient left a voicemail stating she was giving herself the injection and "the medicine spilled all over her hand."  Patient requested a return call to let her know if she should give herself another injection.

## 2021-01-05 NOTE — Telephone Encounter (Signed)
Returned patient's call - she initially stated she felt needle enter her skin but later stated that she wasn't sure. She was not self-administered - caregiver administered.  Advised her not to double up on dose and take next dose as scheduled. Recommended that she lay down for next dose to make administration more comfortable for her and caregiver. Recommended that she allow caregiver to pinch the skin rather than patient doing it to keep administration area clear. Patient also states there is a small red spot where the first dose was administered - does not feel itchy and is not swollen, but she only saw it when she was getting ready for this week's dose. Recommended she rotate site to left side of abdomen for next dose.   Encouraged her and caregiver to reach out to me if further guidance is needed for the next dose.  Knox Saliva, PharmD, MPH Clinical Pharmacist (Rheumatology and Pulmonology)

## 2021-01-06 DIAGNOSIS — M069 Rheumatoid arthritis, unspecified: Secondary | ICD-10-CM | POA: Diagnosis not present

## 2021-01-06 DIAGNOSIS — G894 Chronic pain syndrome: Secondary | ICD-10-CM | POA: Diagnosis not present

## 2021-01-06 DIAGNOSIS — M353 Polymyalgia rheumatica: Secondary | ICD-10-CM | POA: Diagnosis not present

## 2021-01-06 DIAGNOSIS — M316 Other giant cell arteritis: Secondary | ICD-10-CM | POA: Diagnosis not present

## 2021-01-07 DIAGNOSIS — E039 Hypothyroidism, unspecified: Secondary | ICD-10-CM | POA: Diagnosis not present

## 2021-01-07 DIAGNOSIS — K219 Gastro-esophageal reflux disease without esophagitis: Secondary | ICD-10-CM | POA: Diagnosis not present

## 2021-01-07 DIAGNOSIS — Z1389 Encounter for screening for other disorder: Secondary | ICD-10-CM | POA: Diagnosis not present

## 2021-01-07 DIAGNOSIS — E211 Secondary hyperparathyroidism, not elsewhere classified: Secondary | ICD-10-CM | POA: Diagnosis not present

## 2021-01-07 DIAGNOSIS — R7309 Other abnormal glucose: Secondary | ICD-10-CM | POA: Diagnosis not present

## 2021-01-07 DIAGNOSIS — M48061 Spinal stenosis, lumbar region without neurogenic claudication: Secondary | ICD-10-CM | POA: Diagnosis not present

## 2021-01-07 DIAGNOSIS — M353 Polymyalgia rheumatica: Secondary | ICD-10-CM | POA: Diagnosis not present

## 2021-01-07 DIAGNOSIS — I5032 Chronic diastolic (congestive) heart failure: Secondary | ICD-10-CM | POA: Diagnosis not present

## 2021-01-07 DIAGNOSIS — I129 Hypertensive chronic kidney disease with stage 1 through stage 4 chronic kidney disease, or unspecified chronic kidney disease: Secondary | ICD-10-CM | POA: Diagnosis not present

## 2021-01-07 DIAGNOSIS — N1832 Chronic kidney disease, stage 3b: Secondary | ICD-10-CM | POA: Diagnosis not present

## 2021-01-07 DIAGNOSIS — B0229 Other postherpetic nervous system involvement: Secondary | ICD-10-CM | POA: Diagnosis not present

## 2021-01-07 DIAGNOSIS — E559 Vitamin D deficiency, unspecified: Secondary | ICD-10-CM | POA: Diagnosis not present

## 2021-01-07 DIAGNOSIS — M150 Primary generalized (osteo)arthritis: Secondary | ICD-10-CM | POA: Diagnosis not present

## 2021-01-07 DIAGNOSIS — I10 Essential (primary) hypertension: Secondary | ICD-10-CM | POA: Diagnosis not present

## 2021-01-07 DIAGNOSIS — E538 Deficiency of other specified B group vitamins: Secondary | ICD-10-CM | POA: Diagnosis not present

## 2021-01-07 DIAGNOSIS — M069 Rheumatoid arthritis, unspecified: Secondary | ICD-10-CM | POA: Diagnosis not present

## 2021-01-07 DIAGNOSIS — Z1331 Encounter for screening for depression: Secondary | ICD-10-CM | POA: Diagnosis not present

## 2021-01-07 DIAGNOSIS — Z6841 Body Mass Index (BMI) 40.0 and over, adult: Secondary | ICD-10-CM | POA: Diagnosis not present

## 2021-01-07 DIAGNOSIS — R739 Hyperglycemia, unspecified: Secondary | ICD-10-CM | POA: Diagnosis not present

## 2021-01-10 DIAGNOSIS — M961 Postlaminectomy syndrome, not elsewhere classified: Secondary | ICD-10-CM | POA: Diagnosis not present

## 2021-01-10 DIAGNOSIS — I130 Hypertensive heart and chronic kidney disease with heart failure and stage 1 through stage 4 chronic kidney disease, or unspecified chronic kidney disease: Secondary | ICD-10-CM | POA: Diagnosis not present

## 2021-01-10 DIAGNOSIS — G894 Chronic pain syndrome: Secondary | ICD-10-CM | POA: Diagnosis not present

## 2021-01-10 DIAGNOSIS — N183 Chronic kidney disease, stage 3 unspecified: Secondary | ICD-10-CM | POA: Diagnosis not present

## 2021-01-10 DIAGNOSIS — I5042 Chronic combined systolic (congestive) and diastolic (congestive) heart failure: Secondary | ICD-10-CM | POA: Diagnosis not present

## 2021-01-10 DIAGNOSIS — M48062 Spinal stenosis, lumbar region with neurogenic claudication: Secondary | ICD-10-CM | POA: Diagnosis not present

## 2021-01-12 DIAGNOSIS — E211 Secondary hyperparathyroidism, not elsewhere classified: Secondary | ICD-10-CM | POA: Diagnosis not present

## 2021-01-12 DIAGNOSIS — N1832 Chronic kidney disease, stage 3b: Secondary | ICD-10-CM | POA: Diagnosis not present

## 2021-01-12 DIAGNOSIS — I5033 Acute on chronic diastolic (congestive) heart failure: Secondary | ICD-10-CM | POA: Diagnosis not present

## 2021-01-12 DIAGNOSIS — I129 Hypertensive chronic kidney disease with stage 1 through stage 4 chronic kidney disease, or unspecified chronic kidney disease: Secondary | ICD-10-CM | POA: Diagnosis not present

## 2021-01-17 DIAGNOSIS — G894 Chronic pain syndrome: Secondary | ICD-10-CM | POA: Diagnosis not present

## 2021-01-17 DIAGNOSIS — M48062 Spinal stenosis, lumbar region with neurogenic claudication: Secondary | ICD-10-CM | POA: Diagnosis not present

## 2021-01-17 DIAGNOSIS — N183 Chronic kidney disease, stage 3 unspecified: Secondary | ICD-10-CM | POA: Diagnosis not present

## 2021-01-17 DIAGNOSIS — M961 Postlaminectomy syndrome, not elsewhere classified: Secondary | ICD-10-CM | POA: Diagnosis not present

## 2021-01-17 DIAGNOSIS — I130 Hypertensive heart and chronic kidney disease with heart failure and stage 1 through stage 4 chronic kidney disease, or unspecified chronic kidney disease: Secondary | ICD-10-CM | POA: Diagnosis not present

## 2021-01-17 DIAGNOSIS — I5042 Chronic combined systolic (congestive) and diastolic (congestive) heart failure: Secondary | ICD-10-CM | POA: Diagnosis not present

## 2021-01-19 DIAGNOSIS — F54 Psychological and behavioral factors associated with disorders or diseases classified elsewhere: Secondary | ICD-10-CM | POA: Diagnosis not present

## 2021-01-19 DIAGNOSIS — M5416 Radiculopathy, lumbar region: Secondary | ICD-10-CM | POA: Diagnosis not present

## 2021-01-19 DIAGNOSIS — M48061 Spinal stenosis, lumbar region without neurogenic claudication: Secondary | ICD-10-CM | POA: Diagnosis not present

## 2021-01-19 DIAGNOSIS — M533 Sacrococcygeal disorders, not elsewhere classified: Secondary | ICD-10-CM | POA: Diagnosis not present

## 2021-01-19 NOTE — Progress Notes (Deleted)
Office Visit Note  Patient: Nicole [name]             Date of Birth: 02-Dec-1945           MRN: [id number]             PCP: Sharilyn Sites, MD Referring: Sharilyn Sites, MD Visit Date: 01/20/2021 Occupation: @GUAROCC @  Subjective:    History of Present Illness: Nicole [name] is a 75 y.o. female with history of polymyalgia rheumatica and rheumatoid arthritis.  Patient is currently on Kevzara 200 mg subcutaneous injections every 14 days, Arava 10 mg by mouth daily, and prednisone 10 mg by mouth daily.  She was started on Kevzara on 12/21/2020.  CBC and CMP were updated on 11/19/2020.  She is due to update CBC and CMP today.  Orders were released.  Her next lab work will be due in June and every 3 months to monitor for drug toxicity. Standing orders for CBC and CMP are placed today. TB gold negative on 09/09/2020.  Activities of Daily Living:  Patient reports morning stiffness for *** {minute/hour:19697}.   Patient {ACTIONS;DENIES/REPORTS:21021675::"Denies"} nocturnal pain.  Difficulty dressing/grooming: {ACTIONS;DENIES/REPORTS:21021675::"Denies"} Difficulty climbing stairs: {ACTIONS;DENIES/REPORTS:21021675::"Denies"} Difficulty getting out of chair: {ACTIONS;DENIES/REPORTS:21021675::"Denies"} Difficulty using hands for taps, buttons, cutlery, and/or writing: {ACTIONS;DENIES/REPORTS:21021675::"Denies"}  No Rheumatology ROS completed.   PMFS History:  Patient Active Problem List   Diagnosis Date Noted  . Diverticulitis 05/02/2018  . Diverticulitis of colon 05/01/2018  . Morbid obesity with BMI of 50.0-59.9, adult ([place]) 05/01/2018  . Chronic diastolic HF (heart failure) ([place]) 05/01/2018  . On prednisone therapy 05/31/2017  . Positive anti-CCP test 05/31/2017  . Rheumatoid factor positive 05/31/2017  . Memory loss 04/04/2017  . Polymyalgia rheumatica ([place]) 04/04/2017  . High risk medication use 12/07/2016  . DDD (degenerative disc disease), lumbar 09/27/2016  . HTN (hypertension)  06/05/2016  . Chronic pain syndrome 06/05/2016  . Acute diverticulitis 06/02/2016  . Diverticulitis large intestine 06/02/2016  . Spinal stenosis of lumbosacral region 02/29/2016  . Joint pain 02/29/2016  . Endometrial polyp 02/06/2014  . Postmenopausal bleeding 01/30/2014  . GERD (gastroesophageal reflux disease) 09/29/2013  . Spinal stenosis, thoracic 09/29/2013  . Shingles 09/29/2013  . Thoracic or lumbosacral neuritis or radiculitis, unspecified 05/04/2011  . Abnormality of gait 05/04/2011  . Muscle weakness (generalized) 05/04/2011  . Bilateral primary osteoarthritis of knee 04/07/2009  . KNEE PAIN 04/07/2009    Past Medical History:  Diagnosis Date  . AC (acromioclavicular) joint bone spurs    lt shoulder  . Anemia   . Arthritis   . Carpal tunnel syndrome, bilateral   . CHF (congestive heart failure) ([place])   . Collagen vascular disease ([place])   . Gastroesophageal reflux   . Headache    recent visit to ER @ [facility] for severe headache  . Hypertension   . Lumbar stenosis    Hx of ESIs by Dr. Nelva Bush  . Polyarthralgia   . Polymyalgia ([place])   . Shingles   . Spinal stenosis     Family History  Problem Relation Age of Onset  . Hypertension Mother   . Pneumonia Father   . Arthritis Sister   . Diabetes Paternal Grandmother    Past Surgical History:  Procedure Laterality Date  . BACK SURGERY  07/05/2018, 07/2018   x2   . BIOPSY  09/22/2020   Procedure: BIOPSY;  Surgeon: Rogene Houston, MD;  [facility];  Service: Endoscopy;;  . CHOLECYSTECTOMY    . COLONOSCOPY  06/11/2012  Procedure: COLONOSCOPY;  Surgeon: Jamesetta So, MD;  Location: [facility];  Service: Gastroenterology;  Laterality: N/A;  . COLONOSCOPY WITH PROPOFOL N/A 09/22/2020   Procedure: COLONOSCOPY WITH PROPOFOL;  Surgeon: Rogene Houston, MD;  Location: [facility];  Service: Endoscopy;  Laterality: N/A;  730  . HYSTEROSCOPY WITH D & C N/A 02/25/2014   Procedure: DILATATION AND  CURETTAGE /HYSTEROSCOPY;  Surgeon: Florian Buff, MD;  Location: [facility];  Service: Gynecology;  Laterality: N/A;  . POLYPECTOMY N/A 02/25/2014   Procedure: POLYPECTOMY;  Surgeon: Florian Buff, MD;  Location: [facility];  Service: Gynecology;  Laterality: N/A;  . RESECTION DISTAL CLAVICAL Right 03/26/2015   Procedure: OPEN DISTAL CLAVICAL RESECTION ;  Surgeon: Netta Cedars, MD;  Location: [facility];  Service: Orthopedics;  Laterality: Right;   Social History   Social History Narrative   Lives at home alone.   Right-handed.   1-2 cups caffeine daily.   Immunization History  Administered Date(s) Administered  . Influenza,inj,quad, With Preservative 07/23/2017  . Unspecified SARS-COV-2 Vaccination 06/02/2020, 06/23/2020     Objective: Vital Signs: There were no vitals taken for this visit.   Physical Exam   Musculoskeletal Exam: ***  CDAI Exam: CDAI Score: -- Patient Global: --; Provider Global: -- Swollen: --; Tender: -- Joint Exam 01/20/2021   No joint exam has been documented for this visit   There is currently no information documented on the homunculus. Go to the Rheumatology activity and complete the homunculus joint exam.  Investigation: No additional findings.  Imaging: No results found.  Recent Labs: Lab Results  Component Value Date   WBC 11.4 (H) 11/19/2020   HGB 12.4 11/19/2020   PLT 357 11/19/2020   NA 141 11/19/2020   K 4.1 11/19/2020   CL 100 11/19/2020   CO2 30 11/19/2020   GLUCOSE 99 11/19/2020   BUN 29 (H) 11/19/2020   CREATININE 1.30 (H) 11/19/2020   BILITOT 0.4 11/19/2020   ALKPHOS 158 (H) 10/30/2019   AST 18 11/19/2020   ALT 21 11/19/2020   PROT 6.6 11/19/2020   ALBUMIN 4.1 10/30/2019   CALCIUM 9.4 11/19/2020   GFRAA 47 (L) 11/19/2020   QFTBGOLDPLUS NEGATIVE 09/09/2020    Speciality Comments: PLQ Eye Exam: 12/13/17 W[facility] @ [facility]  Procedures:  No procedures performed Allergies: Oxycontin [oxycodone hcl], Sulfa antibiotics, Tramadol,  Penicillins, and Sulfasalazine   Assessment / Plan:     Visit Diagnoses: No diagnosis found.  Orders: No orders of the defined types were placed in this encounter.  No orders of the defined types were placed in this encounter.   Face-to-face time spent with patient was *** minutes. Greater than 50% of time was spent in counseling and coordination of care.  Follow-Up Instructions: No follow-ups on file.   Earnestine Mealing, CMA  Note - This record has been created using Editor, commissioning.  Chart creation errors have been sought, but may not always  have been located. Such creation errors do not reflect on  the standard of medical care.

## 2021-01-20 ENCOUNTER — Ambulatory Visit: Payer: TRICARE For Life (TFL) | Admitting: Physician Assistant

## 2021-01-20 DIAGNOSIS — M0579 Rheumatoid arthritis with rheumatoid factor of multiple sites without organ or systems involvement: Secondary | ICD-10-CM

## 2021-01-20 DIAGNOSIS — M353 Polymyalgia rheumatica: Secondary | ICD-10-CM

## 2021-01-20 DIAGNOSIS — Z7952 Long term (current) use of systemic steroids: Secondary | ICD-10-CM

## 2021-01-20 DIAGNOSIS — M316 Other giant cell arteritis: Secondary | ICD-10-CM

## 2021-01-20 DIAGNOSIS — Z8679 Personal history of other diseases of the circulatory system: Secondary | ICD-10-CM

## 2021-01-20 DIAGNOSIS — M4804 Spinal stenosis, thoracic region: Secondary | ICD-10-CM

## 2021-01-20 DIAGNOSIS — M170 Bilateral primary osteoarthritis of knee: Secondary | ICD-10-CM

## 2021-01-20 DIAGNOSIS — Z79899 Other long term (current) drug therapy: Secondary | ICD-10-CM

## 2021-01-20 DIAGNOSIS — Z8719 Personal history of other diseases of the digestive system: Secondary | ICD-10-CM

## 2021-01-20 DIAGNOSIS — M5136 Other intervertebral disc degeneration, lumbar region: Secondary | ICD-10-CM

## 2021-01-20 DIAGNOSIS — G894 Chronic pain syndrome: Secondary | ICD-10-CM

## 2021-01-20 DIAGNOSIS — E559 Vitamin D deficiency, unspecified: Secondary | ICD-10-CM

## 2021-01-20 DIAGNOSIS — R413 Other amnesia: Secondary | ICD-10-CM

## 2021-01-20 DIAGNOSIS — K633 Ulcer of intestine: Secondary | ICD-10-CM

## 2021-01-21 ENCOUNTER — Other Ambulatory Visit: Payer: Self-pay | Admitting: Physician Assistant

## 2021-01-21 NOTE — Telephone Encounter (Signed)
Next Visit: 01/27/2021  Last Visit: 11/19/2020  Last Fill: 12/21/2020  Dx: Polymyalgia rheumatica   Current Dose per office note on 11/19/2020, prednisone 5 mg 2 tablets daily  Okay to refill prednisone?

## 2021-01-24 ENCOUNTER — Telehealth: Payer: Self-pay | Admitting: Pharmacist

## 2021-01-24 NOTE — Telephone Encounter (Signed)
-----   Message from Shona Needles, RT sent at 01/24/2021 11:17 AM EDT ----- Regarding: KEVZARA Please call patient regarding KEVZARA.

## 2021-01-24 NOTE — Telephone Encounter (Signed)
Patient reports she wasn't aware that medication box had two pens. States medication may or may not have been out for an hour. Advised that if it was out for 15-30 min, she could place it back in the fridge and use it. However, she said she'd have to wait for caregiver to come back to know exactly how long.  Patient advised when she tried to fill Kevzara early, she will need an override from her insurance.  Knox Saliva, PharmD, MPH Clinical Pharmacist (Rheumatology and Pulmonology)

## 2021-01-25 DIAGNOSIS — M961 Postlaminectomy syndrome, not elsewhere classified: Secondary | ICD-10-CM | POA: Diagnosis not present

## 2021-01-25 DIAGNOSIS — I5042 Chronic combined systolic (congestive) and diastolic (congestive) heart failure: Secondary | ICD-10-CM | POA: Diagnosis not present

## 2021-01-25 DIAGNOSIS — N183 Chronic kidney disease, stage 3 unspecified: Secondary | ICD-10-CM | POA: Diagnosis not present

## 2021-01-25 DIAGNOSIS — I130 Hypertensive heart and chronic kidney disease with heart failure and stage 1 through stage 4 chronic kidney disease, or unspecified chronic kidney disease: Secondary | ICD-10-CM | POA: Diagnosis not present

## 2021-01-25 DIAGNOSIS — M5416 Radiculopathy, lumbar region: Secondary | ICD-10-CM | POA: Diagnosis not present

## 2021-01-25 DIAGNOSIS — M48062 Spinal stenosis, lumbar region with neurogenic claudication: Secondary | ICD-10-CM | POA: Diagnosis not present

## 2021-01-25 DIAGNOSIS — G894 Chronic pain syndrome: Secondary | ICD-10-CM | POA: Diagnosis not present

## 2021-01-27 ENCOUNTER — Other Ambulatory Visit: Payer: Self-pay

## 2021-01-27 ENCOUNTER — Ambulatory Visit: Payer: Self-pay

## 2021-01-27 ENCOUNTER — Ambulatory Visit (INDEPENDENT_AMBULATORY_CARE_PROVIDER_SITE_OTHER): Payer: Medicare Other | Admitting: Physician Assistant

## 2021-01-27 ENCOUNTER — Encounter: Payer: Self-pay | Admitting: Physician Assistant

## 2021-01-27 VITALS — BP 139/94 | HR 103 | Resp 16 | Ht 60.0 in | Wt 247.0 lb

## 2021-01-27 DIAGNOSIS — G894 Chronic pain syndrome: Secondary | ICD-10-CM

## 2021-01-27 DIAGNOSIS — Z7952 Long term (current) use of systemic steroids: Secondary | ICD-10-CM

## 2021-01-27 DIAGNOSIS — M170 Bilateral primary osteoarthritis of knee: Secondary | ICD-10-CM

## 2021-01-27 DIAGNOSIS — M25552 Pain in left hip: Secondary | ICD-10-CM

## 2021-01-27 DIAGNOSIS — Z79899 Other long term (current) drug therapy: Secondary | ICD-10-CM | POA: Diagnosis not present

## 2021-01-27 DIAGNOSIS — M4804 Spinal stenosis, thoracic region: Secondary | ICD-10-CM | POA: Diagnosis not present

## 2021-01-27 DIAGNOSIS — E559 Vitamin D deficiency, unspecified: Secondary | ICD-10-CM | POA: Diagnosis not present

## 2021-01-27 DIAGNOSIS — K633 Ulcer of intestine: Secondary | ICD-10-CM | POA: Diagnosis not present

## 2021-01-27 DIAGNOSIS — M353 Polymyalgia rheumatica: Secondary | ICD-10-CM | POA: Diagnosis not present

## 2021-01-27 DIAGNOSIS — M0579 Rheumatoid arthritis with rheumatoid factor of multiple sites without organ or systems involvement: Secondary | ICD-10-CM | POA: Diagnosis not present

## 2021-01-27 DIAGNOSIS — M316 Other giant cell arteritis: Secondary | ICD-10-CM

## 2021-01-27 DIAGNOSIS — Z8719 Personal history of other diseases of the digestive system: Secondary | ICD-10-CM

## 2021-01-27 DIAGNOSIS — Z87448 Personal history of other diseases of urinary system: Secondary | ICD-10-CM

## 2021-01-27 DIAGNOSIS — M5136 Other intervertebral disc degeneration, lumbar region: Secondary | ICD-10-CM

## 2021-01-27 DIAGNOSIS — R413 Other amnesia: Secondary | ICD-10-CM

## 2021-01-27 DIAGNOSIS — Z8679 Personal history of other diseases of the circulatory system: Secondary | ICD-10-CM

## 2021-01-27 NOTE — Progress Notes (Signed)
Office Visit Note  Patient: Nicole [name]             Date of Birth: Jul 09, 1946           MRN: [id number]             PCP: Sharilyn Sites, MD Referring: Sharilyn Sites, MD Visit Date: 01/27/2021 Occupation: @GUAROCC @  Subjective:  Medication monitoring   History of Present Illness: Nicole [name] is a 75 y.o. female with history of seropositive rheumatoid arthritis, PMR, and temporal arteritis.  Patient is currently on Kevzara 200 mg every 14 days, Arava 10 mg by mouth daily, and prednisone 10 mg by mouth daily. She was started on kevzara on 12/21/20. She states with one of her injections some of the medication leaked down her leg.  She states her pain management specialist recently changed her dose of nucynta and her pain has been considerably better managed.  She continues to have some lower back, pelvis, and left hip discomfort intermittently.  She states at times she feels a bulge under the left side of her abdomen which can be very painful.  She states she is unable to feel the bulge at this time. She has an upcoming back injection on 02/16/21.    Activities of Daily Living:  Patient reports morning stiffness for several  hours.   Patient Denies nocturnal pain.  Difficulty dressing/grooming: Reports Difficulty climbing stairs: Reports Difficulty getting out of chair: Reports Difficulty using hands for taps, buttons, cutlery, and/or writing: Reports  Review of Systems  Constitutional: Positive for fatigue.  HENT: Positive for mouth dryness and nose dryness. Negative for mouth sores.   Eyes: Positive for dryness. Negative for pain and visual disturbance.  Respiratory: Positive for shortness of breath. Negative for cough, hemoptysis and difficulty breathing.   Cardiovascular: Positive for swelling in legs/feet. Negative for chest pain, palpitations and hypertension.  Gastrointestinal: Positive for diarrhea. Negative for blood in stool and constipation.  Endocrine: Negative for increased  urination.  Genitourinary: Negative for painful urination.  Musculoskeletal: Positive for arthralgias, joint pain, joint swelling, myalgias, muscle weakness, morning stiffness, muscle tenderness and myalgias.  Skin: Negative for color change, pallor, rash, hair loss, nodules/bumps, skin tightness, ulcers and sensitivity to sunlight.  Allergic/Immunologic: Negative for susceptible to infections.  Neurological: Positive for dizziness, numbness, headaches and parasthesias. Negative for memory loss and weakness.  Hematological: Negative for swollen glands.  Psychiatric/Behavioral: Negative for depressed mood, confusion and sleep disturbance. The patient is not nervous/anxious.     PMFS History:  Patient Active Problem List   Diagnosis Date Noted  . Diverticulitis 05/02/2018  . Diverticulitis of colon 05/01/2018  . Morbid obesity with BMI of 50.0-59.9, adult ([place]) 05/01/2018  . Chronic diastolic HF (heart failure) ([place]) 05/01/2018  . On prednisone therapy 05/31/2017  . Positive anti-CCP test 05/31/2017  . Rheumatoid factor positive 05/31/2017  . Memory loss 04/04/2017  . Polymyalgia rheumatica ([place]) 04/04/2017  . High risk medication use 12/07/2016  . DDD (degenerative disc disease), lumbar 09/27/2016  . HTN (hypertension) 06/05/2016  . Chronic pain syndrome 06/05/2016  . Acute diverticulitis 06/02/2016  . Diverticulitis large intestine 06/02/2016  . Spinal stenosis of lumbosacral region 02/29/2016  . Joint pain 02/29/2016  . Endometrial polyp 02/06/2014  . Postmenopausal bleeding 01/30/2014  . GERD (gastroesophageal reflux disease) 09/29/2013  . Spinal stenosis, thoracic 09/29/2013  . Shingles 09/29/2013  . Thoracic or lumbosacral neuritis or radiculitis, unspecified 05/04/2011  . Abnormality of gait 05/04/2011  . Muscle weakness (generalized) 05/04/2011  .  Bilateral primary osteoarthritis of knee 04/07/2009  . KNEE PAIN 04/07/2009    Past Medical History:  Diagnosis Date  . AC  (acromioclavicular) joint bone spurs    lt shoulder  . Anemia   . Arthritis   . Carpal tunnel syndrome, bilateral   . CHF (congestive heart failure) ([place])   . Collagen vascular disease ([place])   . Gastroesophageal reflux   . Headache    recent visit to ER @ [facility] for severe headache  . Hypertension   . Lumbar stenosis    Hx of ESIs by Dr. Nelva Bush  . Polyarthralgia   . Polymyalgia ([place])   . Shingles   . Spinal stenosis     Family History  Problem Relation Age of Onset  . Hypertension Mother   . Pneumonia Father   . Arthritis Sister   . Diabetes Paternal Grandmother    Past Surgical History:  Procedure Laterality Date  . BACK SURGERY  07/05/2018, 07/2018   x2   . BIOPSY  09/22/2020   Procedure: BIOPSY;  Surgeon: Rogene Houston, MD;  [facility];  Service: Endoscopy;;  . CHOLECYSTECTOMY    . COLONOSCOPY  06/11/2012   Procedure: COLONOSCOPY;  Surgeon: Jamesetta So, MD;  Location: [facility];  Service: Gastroenterology;  Laterality: N/A;  . COLONOSCOPY WITH PROPOFOL N/A 09/22/2020   Procedure: COLONOSCOPY WITH PROPOFOL;  Surgeon: Rogene Houston, MD;  Location: [facility];  Service: Endoscopy;  Laterality: N/A;  730  . HYSTEROSCOPY WITH D & C N/A 02/25/2014   Procedure: DILATATION AND CURETTAGE /HYSTEROSCOPY;  Surgeon: Florian Buff, MD;  Location: [facility];  Service: Gynecology;  Laterality: N/A;  . POLYPECTOMY N/A 02/25/2014   Procedure: POLYPECTOMY;  Surgeon: Florian Buff, MD;  Location: [facility];  Service: Gynecology;  Laterality: N/A;  . RESECTION DISTAL CLAVICAL Right 03/26/2015   Procedure: OPEN DISTAL CLAVICAL RESECTION ;  Surgeon: Netta Cedars, MD;  Location: [place];  Service: Orthopedics;  Laterality: Right;   Social History   Social History Narrative   Lives at home alone.   Right-handed.   1-2 cups caffeine daily.   Immunization History  Administered Date(s) Administered  . Influenza,inj,quad, With Preservative 07/23/2017  . Unspecified SARS-COV-2  Vaccination 06/02/2020, 06/23/2020     Objective: Vital Signs: BP (!) 139/94 (BP Location: Left Wrist, Patient Position: Sitting, Cuff Size: Normal)   Pulse (!) 103   Resp 16   Ht 5' (1.524 m)   Wt 247 lb (112 kg) Comment: per patient  BMI 48.24 kg/m    Physical Exam Vitals and nursing note reviewed.  Constitutional:      Appearance: She is well-developed.  HENT:     Head: Normocephalic and atraumatic.  Eyes:     Conjunctiva/sclera: Conjunctivae normal.  Pulmonary:     Effort: Pulmonary effort is normal.  Abdominal:     Palpations: Abdomen is soft.  Musculoskeletal:     Cervical back: Normal range of motion.  Skin:    General: Skin is warm and dry.     Capillary Refill: Capillary refill takes less than 2 seconds.  Neurological:     Mental Status: She is alert and oriented to person, place, and time.  Psychiatric:        Behavior: Behavior normal.      Musculoskeletal Exam: Patient remained in wheelchair during the exam.  C-spine slightly limited ROM with lateral rotation.  Postural thoracic kyphosis.  Difficult to assess lumbar ROM.  Shoulder  joint abduction to about 110 degrees. Elbow joints good ROM with no tenderness.  Mild extensor tenosynovitis of the right wrist.  No tenderness or synovitis of MCP joints.  Complete fist formation bilaterally.  PIP and DIP thickening consistent with osteoarthritis of both hands.  Painful and limited ROM of the left hip.  Knee joint ROM has improved.  Warmth of the left knee. No knee joint effusions noted.  Pedal edema noted bilaterally.   CDAI Exam: CDAI Score: -- Patient Global: --; Provider Global: -- Swollen: --; Tender: -- Joint Exam 01/27/2021   No joint exam has been documented for this visit   There is currently no information documented on the homunculus. Go to the Rheumatology activity and complete the homunculus joint exam.  Investigation: No additional findings.  Imaging: No results found.  Recent Labs: Lab  Results  Component Value Date   WBC 11.4 (H) 11/19/2020   HGB 12.4 11/19/2020   PLT 357 11/19/2020   NA 141 11/19/2020   K 4.1 11/19/2020   CL 100 11/19/2020   CO2 30 11/19/2020   GLUCOSE 99 11/19/2020   BUN 29 (H) 11/19/2020   CREATININE 1.30 (H) 11/19/2020   BILITOT 0.4 11/19/2020   ALKPHOS 158 (H) 10/30/2019   AST 18 11/19/2020   ALT 21 11/19/2020   PROT 6.6 11/19/2020   ALBUMIN 4.1 10/30/2019   CALCIUM 9.4 11/19/2020   GFRAA 47 (L) 11/19/2020   QFTBGOLDPLUS NEGATIVE 09/09/2020    Speciality Comments: PLQ Eye Exam: 12/13/17 WNl @ [facility]  Procedures:  No procedures performed Allergies: Oxycontin [oxycodone hcl], Sulfa antibiotics, Tramadol, Penicillins, and Sulfasalazine   Assessment / Plan:     Visit Diagnoses: Rheumatoid arthritis involving multiple sites with positive rheumatoid factor ([place]): She has mild extensor tenosynovitis of the right wrist.  No tenderness or synovitis over MCP joints.  She has been experiencing increased discomfort in the left hip joint and has limited range of motion on exam. X-rays of the left hip were obtained today. Warmth in the left knee noted.  She is currently on Kevzara 200 mg subcu injections every 14 days, Arava 10 mg 1 tablet by mouth daily, and prednisone 10 mg by mouth daily.  She was started on Kevzara on 12/21/2020.  With one of the injections the medication leaked down her leg so proper injection technique was discussed.  We discussed continuing on the current treatment regimen.  At her follow-up visit we will discuss discontinuing her [place] at that time once she has had time for [facility] to start working.  In the future we would also like to try to start tapering the dose of prednisone gradually once her disease is better controlled.  She will continue to seeing pain management on a regular basis.  I also would highly recommend continuing physical therapy 2-3 times per week since she has noticed a much improvement in her strength and  mobility.  She was advised to notify us if she develops any new or worsening symptoms.  She will follow-up in the office in 3 months.  High risk medication use - Kevzara 200 mg sq injections every 14 days-started on 12/21/20. She continues to take Arava 10 mg 1 tablet by mouth daily and prednisone 5 mg 2 tablets daily.  CBC and CMP were drawn on 11/19/2020.  We will update CBC and CMP today.  Orders were released.  TB gold negative on 09/09/2020 and will continue to be monitored yearly.- Plan: CBC with Differential/Platelet, COMPLETE METABOLIC PANEL  WITH GFR She has not had any recent infections.  We discussed the importance of holding [place] if she develops signs or symptoms of an infection and to resume once the infection has completely cleared.  She voiced understanding. Discussed the importance of trying to discontinue oral diclofenac due to chronic kidney disease, colonic ulcer, and long term prednisone use.  Reviewed the risks of long-term prednisone use as well as the potential side effects/adverse effects of Kevzara.  Polymyalgia rheumatica ([place]):   Temporal arteritis ([place]): No recurrence of symptoms.  Advised to notify us if she develops signs or symptoms of a flare.   On prednisone therapy: She is currently taking prednisone 5 mg 2 tablets by mouth daily she is aware of the risks of long-term systemic prednisone use. Encouraged patient to always take prednisone with food.  Advised to discontinue diclofenac while on long term prednisone.   Chronic pain syndrome: She has been following up closely with pain management.  She is taking Nucynta as prescribed for pain relief.  Since changing the dose of Nucynta as she has noticed a significant improvement in her generalized pain.  Her mobility has started to improve as well.  Primary osteoarthritis of both knees: She has chronic pain in both knee joints.  The range of motion in her knees has improved since her last office visit.  She has  mild warmth in the left knee but no effusion was noted on examination today.  I believe that she will continue to benefit from physical therapy 2-3 times a week for strengthening and stretching exercises.  Pain in left hip: She presents today with increased pain in the left hip.  She is primarily wheelchair-bound.  She has painful and limited range of motion of the left hip on examination today.  Three views of the pelvis were obtained.  She has severe end-stage osteoarthritis of the left hip. - Plan: XR Pelvis 1-2 Views  DDD (degenerative disc disease), lumbar: Chronic pain. She is primarily wheelchair bound.  She continues to follow up with pain management.  Her dose of Nucynta was recently changed which has alleviated her lower back pain and symptoms of radiculopathy.  She has an upcoming injection on 02/16/2021.  She was strongly encouraged to continue physical therapy 2-3 times per week to help improve her muscular deconditioning.  Spinal stenosis, thoracic: Chronic pain.  Colonic ulcer: Evident on colonoscopy 09/22/2021. Followed by Dr. Laural Golden. Nonbleeding ulcer noted at splenic flexure.  She continues to take Protonix as prescribed.  We discussed the importance of avoiding the use of diclofenac.  She is aware of the risks of long-term systemic prednisone use as well as the potential side effects of GI perforation while on Kevzara.  We discussed the importance of being evaluated urgently if she develops abdominal pain or notices blood in her stool.  Vitamin D deficiency  History of diverticulitis: Patient is aware of the risk for GI perforations while on kevzara and prednisone.   History of chronic kidney disease: Stage 3b CKD.  Followed by Dr. Theador Hawthorne.  Office visit note from 01/12/21 was reviewed today in the office. We will forward lab work to him to review.  History of gastroesophageal reflux (GERD): She is taking protonix as prescribed.   History of hypertension: BP was 139/94 today in the  office.   Memory loss    Orders: Orders Placed This Encounter  Procedures  . XR HIP UNILAT W OR W/O PELVIS 2-3 VIEWS LEFT  . CBC with  Differential/Platelet  . COMPLETE METABOLIC PANEL WITH GFR   No orders of the defined types were placed in this encounter.    Follow-Up Instructions: Return in about 3 months (around 04/28/2021) for Rheumatoid arthritis, Polymyalgia Rheumatica.   Ofilia Neas, PA-C  Note - This record has been created using Dragon software.  Chart creation errors have been sought, but may not always  have been located. Such creation errors do not reflect on  the standard of medical care.

## 2021-01-27 NOTE — Progress Notes (Signed)
Please call the patient with x-ray results.   She has severe left hip OA and OA in both SI joints.   Patient was provided a printout of the x-rays while in the office today.

## 2021-01-28 LAB — CBC WITH DIFFERENTIAL/PLATELET
Absolute Monocytes: 427 cells/uL (ref 200–950)
Basophils Absolute: 47 cells/uL (ref 0–200)
Basophils Relative: 0.6 %
Eosinophils Absolute: 71 cells/uL (ref 15–500)
Eosinophils Relative: 0.9 %
HCT: 41.1 % (ref 35.0–45.0)
Hemoglobin: 13.6 g/dL (ref 11.7–15.5)
Lymphs Abs: 1793 cells/uL (ref 850–3900)
MCH: 30 pg (ref 27.0–33.0)
MCHC: 33.1 g/dL (ref 32.0–36.0)
MCV: 90.5 fL (ref 80.0–100.0)
MPV: 9.4 fL (ref 7.5–12.5)
Monocytes Relative: 5.4 %
Neutro Abs: 5562 cells/uL (ref 1500–7800)
Neutrophils Relative %: 70.4 %
Platelets: 396 10*3/uL (ref 140–400)
RBC: 4.54 10*6/uL (ref 3.80–5.10)
RDW: 15.3 % — ABNORMAL HIGH (ref 11.0–15.0)
Total Lymphocyte: 22.7 %
WBC: 7.9 10*3/uL (ref 3.8–10.8)

## 2021-01-28 LAB — COMPLETE METABOLIC PANEL WITH GFR
AG Ratio: 1.6 (calc) (ref 1.0–2.5)
ALT: 19 U/L (ref 6–29)
AST: 19 U/L (ref 10–35)
Albumin: 4.2 g/dL (ref 3.6–5.1)
Alkaline phosphatase (APISO): 87 U/L (ref 37–153)
BUN/Creatinine Ratio: 21 (calc) (ref 6–22)
BUN: 28 mg/dL — ABNORMAL HIGH (ref 7–25)
CO2: 28 mmol/L (ref 20–32)
Calcium: 9.3 mg/dL (ref 8.6–10.4)
Chloride: 101 mmol/L (ref 98–110)
Creat: 1.36 mg/dL — ABNORMAL HIGH (ref 0.60–0.93)
GFR, Est African American: 44 mL/min/{1.73_m2} — ABNORMAL LOW (ref >=60)
GFR, Est Non African American: 38 mL/min/{1.73_m2} — ABNORMAL LOW (ref >=60)
Globulin: 2.7 g/dL (calc) (ref 1.9–3.7)
Glucose, Bld: 101 mg/dL — ABNORMAL HIGH (ref 65–99)
Potassium: 4.2 mmol/L (ref 3.5–5.3)
Sodium: 141 mmol/L (ref 135–146)
Total Bilirubin: 0.4 mg/dL (ref 0.2–1.2)
Total Protein: 6.9 g/dL (ref 6.1–8.1)

## 2021-01-28 NOTE — Progress Notes (Signed)
Labs are stable.  Creatinine remains elevated and GFR is low-44.  Please advise the patient to try to cut back on diclofenac tablets.   Please forward lab work to the patients nephrologist.

## 2021-01-31 DIAGNOSIS — M961 Postlaminectomy syndrome, not elsewhere classified: Secondary | ICD-10-CM | POA: Diagnosis not present

## 2021-01-31 DIAGNOSIS — N1832 Chronic kidney disease, stage 3b: Secondary | ICD-10-CM | POA: Diagnosis not present

## 2021-01-31 DIAGNOSIS — G894 Chronic pain syndrome: Secondary | ICD-10-CM | POA: Diagnosis not present

## 2021-01-31 DIAGNOSIS — E211 Secondary hyperparathyroidism, not elsewhere classified: Secondary | ICD-10-CM | POA: Diagnosis not present

## 2021-01-31 DIAGNOSIS — I130 Hypertensive heart and chronic kidney disease with heart failure and stage 1 through stage 4 chronic kidney disease, or unspecified chronic kidney disease: Secondary | ICD-10-CM | POA: Diagnosis not present

## 2021-01-31 DIAGNOSIS — N183 Chronic kidney disease, stage 3 unspecified: Secondary | ICD-10-CM | POA: Diagnosis not present

## 2021-01-31 DIAGNOSIS — M48062 Spinal stenosis, lumbar region with neurogenic claudication: Secondary | ICD-10-CM | POA: Diagnosis not present

## 2021-01-31 DIAGNOSIS — I5042 Chronic combined systolic (congestive) and diastolic (congestive) heart failure: Secondary | ICD-10-CM | POA: Diagnosis not present

## 2021-01-31 DIAGNOSIS — I129 Hypertensive chronic kidney disease with stage 1 through stage 4 chronic kidney disease, or unspecified chronic kidney disease: Secondary | ICD-10-CM | POA: Diagnosis not present

## 2021-01-31 DIAGNOSIS — I5032 Chronic diastolic (congestive) heart failure: Secondary | ICD-10-CM | POA: Diagnosis not present

## 2021-02-01 ENCOUNTER — Telehealth: Payer: Self-pay

## 2021-02-01 DIAGNOSIS — M4804 Spinal stenosis, thoracic region: Secondary | ICD-10-CM

## 2021-02-01 DIAGNOSIS — M170 Bilateral primary osteoarthritis of knee: Secondary | ICD-10-CM

## 2021-02-01 DIAGNOSIS — G894 Chronic pain syndrome: Secondary | ICD-10-CM

## 2021-02-01 DIAGNOSIS — M5136 Other intervertebral disc degeneration, lumbar region: Secondary | ICD-10-CM

## 2021-02-01 DIAGNOSIS — M25552 Pain in left hip: Secondary | ICD-10-CM

## 2021-02-01 NOTE — Telephone Encounter (Signed)
Shanon Rosser, physical therapist with [facility] left a voicemail requesting a referral for the patient for out-patient physical therapy at [facility].  She states patient has been doing home health and they are not making good enough progress.  Patient might be better suited for her to progress with the use of modalities and aides that they have there.  Erline Levine states patient chose [facility] because she has been there before and had a good experience.  If you have any questions, please call back at [phone]

## 2021-02-01 NOTE — Telephone Encounter (Signed)
PT referral placed for [facility]

## 2021-02-01 NOTE — Telephone Encounter (Signed)
Ok to place referral to PT at [facility].

## 2021-02-01 NOTE — Telephone Encounter (Signed)
Please advise. Thank you

## 2021-02-02 DIAGNOSIS — I129 Hypertensive chronic kidney disease with stage 1 through stage 4 chronic kidney disease, or unspecified chronic kidney disease: Secondary | ICD-10-CM | POA: Diagnosis not present

## 2021-02-02 DIAGNOSIS — I5033 Acute on chronic diastolic (congestive) heart failure: Secondary | ICD-10-CM | POA: Diagnosis not present

## 2021-02-02 DIAGNOSIS — N1832 Chronic kidney disease, stage 3b: Secondary | ICD-10-CM | POA: Diagnosis not present

## 2021-02-02 DIAGNOSIS — E870 Hyperosmolality and hypernatremia: Secondary | ICD-10-CM | POA: Diagnosis not present

## 2021-02-08 ENCOUNTER — Other Ambulatory Visit: Payer: Self-pay | Admitting: Rheumatology

## 2021-02-15 DIAGNOSIS — M5416 Radiculopathy, lumbar region: Secondary | ICD-10-CM | POA: Diagnosis not present

## 2021-02-17 ENCOUNTER — Ambulatory Visit (HOSPITAL_COMMUNITY): Payer: Medicare Other | Admitting: Physical Therapy

## 2021-02-17 ENCOUNTER — Other Ambulatory Visit: Payer: Self-pay | Admitting: Physician Assistant

## 2021-02-18 NOTE — Telephone Encounter (Signed)
Next Visit: 05/12/2021  Last Visit: 01/27/2021  Last Fill: 01/21/2021  Dx:  Rheumatoid arthritis involving multiple sites with positive rheumatoid factor   Current Dose per office note on 01/27/2021, prednisone 5 mg 2 tablets daily  Okay to refill prednisone?

## 2021-02-23 ENCOUNTER — Telehealth: Payer: Self-pay

## 2021-02-23 ENCOUNTER — Other Ambulatory Visit: Payer: Self-pay | Admitting: *Deleted

## 2021-02-23 DIAGNOSIS — M353 Polymyalgia rheumatica: Secondary | ICD-10-CM

## 2021-02-23 DIAGNOSIS — M0579 Rheumatoid arthritis with rheumatoid factor of multiple sites without organ or systems involvement: Secondary | ICD-10-CM

## 2021-02-23 DIAGNOSIS — M316 Other giant cell arteritis: Secondary | ICD-10-CM

## 2021-02-23 MED ORDER — KEVZARA 200 MG/1.14ML ~~LOC~~ SOAJ: 200.0000 mg | SUBCUTANEOUS | 0 refills | Status: DC

## 2021-02-23 NOTE — Telephone Encounter (Signed)
I called patient, patient stated she injected 02/15/2021, patient should inject again 03/01/2021, RX for [place] sent to [facility].

## 2021-02-23 NOTE — Telephone Encounter (Signed)
Patient called stating the woman who takes care of her and organizes her medications on her calendar had shoulder surgery.  Patient states she received a call from her today and told her she should have taken her Kevzara injection yesterday.  Patient states she is very confused and overwhelmed and is not sure how many injections she has remaining or if she needs to order more medication.  Patient requested a return call.

## 2021-02-23 NOTE — Telephone Encounter (Signed)
Next Visit: 05/12/2021  Last Visit: 01/27/2021  Last Fill: 12/07/2020  DX: Rheumatoid arthritis involving multiple sites with positive rheumatoid factor   Current Dose per office note 01/27/2021, Kevzara 200 mg sq injections every 14 days  Labs:  01/27/2021, Labs are stable. Creatinine remains elevated and GFR is low-44. Please advise the patient to try to cut back on diclofenac tablets.  Please forward lab work to the patients nephrologist.   TB Gold: 09/09/2020, negative  Okay to refill Darcus Pester?

## 2021-03-02 DIAGNOSIS — I129 Hypertensive chronic kidney disease with stage 1 through stage 4 chronic kidney disease, or unspecified chronic kidney disease: Secondary | ICD-10-CM | POA: Diagnosis not present

## 2021-03-02 DIAGNOSIS — N1832 Chronic kidney disease, stage 3b: Secondary | ICD-10-CM | POA: Diagnosis not present

## 2021-03-02 DIAGNOSIS — I5033 Acute on chronic diastolic (congestive) heart failure: Secondary | ICD-10-CM | POA: Diagnosis not present

## 2021-03-02 DIAGNOSIS — E870 Hyperosmolality and hypernatremia: Secondary | ICD-10-CM | POA: Diagnosis not present

## 2021-03-03 ENCOUNTER — Ambulatory Visit (HOSPITAL_COMMUNITY): Payer: Medicare Other | Attending: Physician Assistant | Admitting: Physical Therapy

## 2021-03-03 ENCOUNTER — Other Ambulatory Visit: Payer: Self-pay

## 2021-03-03 ENCOUNTER — Encounter (HOSPITAL_COMMUNITY): Payer: Self-pay | Admitting: Physical Therapy

## 2021-03-03 DIAGNOSIS — M25552 Pain in left hip: Secondary | ICD-10-CM | POA: Diagnosis not present

## 2021-03-03 DIAGNOSIS — R29898 Other symptoms and signs involving the musculoskeletal system: Secondary | ICD-10-CM | POA: Diagnosis not present

## 2021-03-03 DIAGNOSIS — M6281 Muscle weakness (generalized): Secondary | ICD-10-CM | POA: Diagnosis not present

## 2021-03-03 DIAGNOSIS — M069 Rheumatoid arthritis, unspecified: Secondary | ICD-10-CM | POA: Diagnosis not present

## 2021-03-03 DIAGNOSIS — M25562 Pain in left knee: Secondary | ICD-10-CM | POA: Diagnosis not present

## 2021-03-03 DIAGNOSIS — M545 Low back pain, unspecified: Secondary | ICD-10-CM | POA: Diagnosis not present

## 2021-03-03 DIAGNOSIS — M25551 Pain in right hip: Secondary | ICD-10-CM

## 2021-03-03 DIAGNOSIS — G894 Chronic pain syndrome: Secondary | ICD-10-CM | POA: Diagnosis not present

## 2021-03-03 DIAGNOSIS — M25561 Pain in right knee: Secondary | ICD-10-CM

## 2021-03-03 DIAGNOSIS — R2689 Other abnormalities of gait and mobility: Secondary | ICD-10-CM | POA: Diagnosis not present

## 2021-03-03 NOTE — Therapy (Signed)
[name], [place], [address] Phone: [phone]   Fax:  [phone]  Physical Therapy Evaluation  Patient Details  Name: Nicole [name] MRN: [id number] Date of Birth: 12-08-1945 Referring Provider (PT): Hazel Sams PA-C   Encounter Date: 03/03/2021   PT End of Session - 03/03/21 1608    Visit Number 1    Number of Visits 12    Date for PT Re-Evaluation 04/14/21    Authorization Type Primary Medicare Secondary Tricare (no auth, no VL)    Progress Note Due on Visit 10    PT Start Time 1400    PT Stop Time 1447    PT Time Calculation (min) 47 min    Activity Tolerance Patient limited by fatigue    Behavior During Therapy [facility] for tasks assessed/performed;Anxious           Past Medical History:  Diagnosis Date  . AC (acromioclavicular) joint bone spurs    lt shoulder  . Anemia   . Arthritis   . Carpal tunnel syndrome, bilateral   . CHF (congestive heart failure) ([place])   . Collagen vascular disease ([place])   . Gastroesophageal reflux   . Headache    recent visit to ER @ [facility] for severe headache  . Hypertension   . Lumbar stenosis    Hx of ESIs by Dr. Nelva Bush  . Polyarthralgia   . Polymyalgia ([place])   . Shingles   . Spinal stenosis     Past Surgical History:  Procedure Laterality Date  . BACK SURGERY  07/05/2018, 07/2018   x2   . BIOPSY  09/22/2020   Procedure: BIOPSY;  Surgeon: Rogene Houston, MD;  [facility];  Service: Endoscopy;;  . CHOLECYSTECTOMY    . COLONOSCOPY  06/11/2012   Procedure: COLONOSCOPY;  Surgeon: Jamesetta So, MD;  [facility];  Service: Gastroenterology;  Laterality: N/A;  . COLONOSCOPY WITH PROPOFOL N/A 09/22/2020   Procedure: COLONOSCOPY WITH PROPOFOL;  Surgeon: Rogene Houston, MD;  [facility];  Service: Endoscopy;  Laterality: N/A;  730  . HYSTEROSCOPY WITH D & C N/A 02/25/2014   Procedure: DILATATION AND CURETTAGE /HYSTEROSCOPY;   Surgeon: Florian Buff, MD;  Location: [facility];  Service: Gynecology;  Laterality: N/A;  . POLYPECTOMY N/A 02/25/2014   Procedure: POLYPECTOMY;  Surgeon: Florian Buff, MD;  Location: [facility];  Service: Gynecology;  Laterality: N/A;  . RESECTION DISTAL CLAVICAL Right 03/26/2015   Procedure: OPEN DISTAL CLAVICAL RESECTION ;  Surgeon: Netta Cedars, MD;  Location: [facility];  Service: Orthopedics;  Laterality: Right;    There were no vitals filed for this visit.    Subjective Assessment - 03/03/21 1422    Subjective Patient is a 75 y.o. female who presents to physical therapy with c/o back pain, knee pain and hip pain. They readjusted her pain medicine and rheumatologist did injection in her stomach. Pain in her hip and lower back run into her leg. She has swelling in her legs. She has CHF. She is somewhat out of breath a little more than normal. She sometimes wears compression garments. She wears AFO for drop foot. She has had increase in LE swelling over last few weeks. She is a little more short of breath. She was getting HHPT and they thought she was ready for d/c to OPPT. She does her own transfers and can walk with RW for about 7 feet. She is having trouble  with walking, stairs, endurance.    Patient is accompained by: Family member    Limitations House hold activities;Lifting;Standing;Walking    How long can you walk comfortably? 10 feet    Patient Stated Goals walk and drive car and lose weight    Currently in Pain? Yes    Pain Score 7     Pain Location Generalized    Pain Descriptors / Indicators Aching;Burning;Tingling    Pain Type Chronic pain    Pain Onset More than a month ago    Pain Frequency Constant              [facility] PT Assessment - 03/03/21 0001      Assessment   Medical Diagnosis OA both knees, DDD, spinal stenosis thoracic, hip pain    Referring Provider (PT) Hazel Sams PA-C    Prior Therapy HHPT      Precautions   Precautions Fall      Balance Screen   Has the  patient fallen in the past 6 months No    Has the patient had a decrease in activity level because of a fear of falling?  No    Is the patient reluctant to leave their home because of a fear of falling?  No      Prior Function   Level of Independence Independent with homemaking with wheelchair      Cognition   Overall Cognitive Status Within Functional Limits for tasks assessed      Observation/Other Assessments   Observations uses WC for mobility, L AFO    Skin Integrity severe bilateral pitting edema, LLE weeping    Focus on Therapeutic Outcomes (FOTO)  n/a      ROM / Strength   AROM / PROM / Strength Strength      Strength   Strength Assessment Site Hip;Knee;Ankle    Right/Left Hip Right;Left    Right Hip Flexion 2+/5    Left Hip Flexion 2+/5    Right/Left Knee Right;Left    Right Knee Flexion 4/5    Right Knee Extension 3+/5    Left Knee Flexion 4/5    Left Knee Extension 3/5    Right/Left Ankle Right;Left    Right Ankle Dorsiflexion 4-/5    Left Ankle Dorsiflexion 2/5      Transfers   Comments slow, labored, requires multiple attempts needed, difficulty coming to full standing, fatigued following, SOB increasing, stand pivot transfer to/from chair      Ambulation/Gait   Ambulation/Gait Yes    Ambulation Distance (Feet) 2 Feet    Gait Comments slow, labored shuffled steps with UE support on plinth to ambulate short distance from [facility] to plint, unable to come to upright position                      Objective measurements completed on examination: See above findings.               PT Education - 03/03/21 1420    Education Details Patient educated on exam findings, POC, scope of PT, f/u with MD/ going to ED regarding LE swelling, compression garments    Person(s) Educated Patient;Other (comment);Child(ren)   Niece with patient, son on phone   Methods Explanation;Demonstration    Comprehension Verbalized understanding;Returned demonstration             PT Short Term Goals - 03/03/21 1626      PT SHORT TERM GOAL #1   Title Patient will be independent  with HEP in order to improve functional outcomes.    Time 3    Period Weeks    Status New    Target Date 03/24/21      PT SHORT TERM GOAL #2   Title Patient will report at least 25% improvement in symptoms for improved quality of life.    Time 3    Period Weeks    Status New    Target Date 03/24/21             PT Long Term Goals - 03/03/21 1627      PT LONG TERM GOAL #1   Title Patient to demonsrate improvement of at least 1 MMT grade in all tested groups in order to improve gait and mobility     Time 6    Period Weeks    Status New    Target Date 04/14/21      PT LONG TERM GOAL #2   Title Patient will report at least 75% improvement in symptoms for improved quality of life.    Time 6    Period Weeks    Status New    Target Date 04/14/21      PT LONG TERM GOAL #3   Title Patient will be able to ambulate at least 50 feet to assist with household ambulation.    Time 6    Period Weeks    Status New    Target Date 04/14/21                  Plan - 03/03/21 1611    Clinical Impression Statement Patient is a 75 y.o. female who presents to physical therapy with c/o back pain, knee pain and hip pain and overall weakness. Upon further examination, patient showing severe bilateral LE pitting edema and tenderness which she states has been gradually increasing over last several weeks. She has not been using her compression stocking and also notes increasing SOB recently which may be related to her CHF. Patient and her niece are educated on follow up with MD/ going to ED today regarding symptoms. Also, discussed patient's symptoms and session with patient's son on the phone. Patient increasing edema has been making it increasingly difficult for mobility. She presents with pain limited deficits in lumbar and bilateral LE strength, ROM, endurance, postural  impairments, pitting edema, gait, transfers, and functional mobility with ADL. She is having to modify and restrict ADL as indicated by subjective information and objective measures which is affecting overall participation. Patient will benefit from skilled physical therapy in order to improve function and reduce impairment.    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    Surveyor, mining    Rehab Potential Fair    PT Frequency 2x / week    PT Duration 6 weeks    PT Treatment/Interventions ADLs/Self Care Home Management;Aquatic Therapy;Canalith Repostioning;Cryotherapy;Electrical Stimulation;Iontophoresis 4mg /ml Dexamethasone;Moist Heat;Traction;Ultrasound;Parrafin;DME Instruction;Gait training;Stair training;Functional mobility training;Therapeutic activities;Therapeutic exercise;Balance training;Neuromuscular re-education;Patient/family education;Orthotic Fit/Training;Manual techniques;Manual lymph drainage;Compression bandaging;Scar mobilization;Passive range of motion;Dry needling;Energy conservation;Splinting;Taping;Vasopneumatic Device;Spinal Manipulations;Joint Manipulations    PT Next Visit Plan f/u with pitting edema and SOB symptoms. begin bilateral LE strengthening in seated and table exercises, begin standing exercise and gait as able, improve endurance/ activity tolerance as able  PT Home Exercise Plan begin next session    Consulted and Agree with Plan of Care  Patient;Family member/caregiver    Family Member Consulted niece and son           Patient will benefit from skilled therapeutic intervention in order to improve the following deficits and impairments:  Abnormal gait,Difficulty walking,Decreased range of motion,Decreased endurance,Increased muscle spasms,Pain,Decreased activity tolerance,Decreased skin integrity,Decreased balance,Impaired flexibility,Improper body mechanics,Postural dysfunction,Increased edema,Decreased strength,Decreased mobility  Visit Diagnosis: Muscle weakness (generalized)  Other abnormalities of gait and mobility  Other symptoms and signs involving the musculoskeletal system  Low back pain, unspecified back pain laterality, unspecified chronicity, unspecified whether sciatica present  Bilateral hip pain  Pain in both knees, unspecified chronicity     Problem List Patient Active Problem List   Diagnosis Date Noted  . Diverticulitis 05/02/2018  . Diverticulitis of colon 05/01/2018  . Morbid obesity with BMI of 50.0-59.9, adult ([place]) 05/01/2018  . Chronic diastolic HF (heart failure) ([place]) 05/01/2018  . On prednisone therapy 05/31/2017  . Positive anti-CCP test 05/31/2017  . Rheumatoid factor positive 05/31/2017  . Memory loss 04/04/2017  . Polymyalgia rheumatica ([place]) 04/04/2017  . High risk medication use 12/07/2016  . DDD (degenerative disc disease), lumbar 09/27/2016  . HTN (hypertension) 06/05/2016  . Chronic pain syndrome 06/05/2016  . Acute diverticulitis 06/02/2016  . Diverticulitis large intestine 06/02/2016  . Spinal stenosis of lumbosacral region 02/29/2016  . Joint pain 02/29/2016  . Endometrial polyp 02/06/2014  . Postmenopausal bleeding 01/30/2014  . GERD (gastroesophageal reflux disease) 09/29/2013  . Spinal stenosis, thoracic 09/29/2013  . Shingles 09/29/2013  . Thoracic or lumbosacral neuritis or radiculitis, unspecified 05/04/2011  . Abnormality of gait 05/04/2011  . Muscle  weakness (generalized) 05/04/2011  . Bilateral primary osteoarthritis of knee 04/07/2009  . KNEE PAIN 04/07/2009   4:30 PM, 03/03/21 Mearl Latin PT, DPT Physical Therapist at [place], [place], [address] Phone: [phone]   Fax:  [phone]  Name: Conny Situ MRN: [id number] Date of Birth: [birth year]

## 2021-03-04 DIAGNOSIS — N1832 Chronic kidney disease, stage 3b: Secondary | ICD-10-CM | POA: Diagnosis not present

## 2021-03-04 DIAGNOSIS — E870 Hyperosmolality and hypernatremia: Secondary | ICD-10-CM | POA: Diagnosis not present

## 2021-03-04 DIAGNOSIS — I129 Hypertensive chronic kidney disease with stage 1 through stage 4 chronic kidney disease, or unspecified chronic kidney disease: Secondary | ICD-10-CM | POA: Diagnosis not present

## 2021-03-04 DIAGNOSIS — I5033 Acute on chronic diastolic (congestive) heart failure: Secondary | ICD-10-CM | POA: Diagnosis not present

## 2021-03-08 ENCOUNTER — Ambulatory Visit: Payer: TRICARE For Life (TFL) | Admitting: Family Medicine

## 2021-03-08 ENCOUNTER — Other Ambulatory Visit: Payer: Self-pay

## 2021-03-08 ENCOUNTER — Ambulatory Visit (HOSPITAL_COMMUNITY): Payer: Medicare Other | Admitting: Physical Therapy

## 2021-03-08 DIAGNOSIS — R2689 Other abnormalities of gait and mobility: Secondary | ICD-10-CM

## 2021-03-08 DIAGNOSIS — M6281 Muscle weakness (generalized): Secondary | ICD-10-CM

## 2021-03-08 DIAGNOSIS — M25552 Pain in left hip: Secondary | ICD-10-CM | POA: Diagnosis not present

## 2021-03-08 DIAGNOSIS — M25551 Pain in right hip: Secondary | ICD-10-CM

## 2021-03-08 DIAGNOSIS — M545 Low back pain, unspecified: Secondary | ICD-10-CM | POA: Diagnosis not present

## 2021-03-08 DIAGNOSIS — R29898 Other symptoms and signs involving the musculoskeletal system: Secondary | ICD-10-CM | POA: Diagnosis not present

## 2021-03-08 NOTE — Patient Instructions (Signed)
TRUNK ROTATIONS    Lying on back, with knees bent and feet flat on floor, arms outstretched to sides, slowly roll both knees to side, hold__10__ seconds. Then to opposite side, hold__10__seconds. Keep shoulders and arms in contact with floor.  Repeat 10 times   Heel Slides    With neutral spine, tighten pelvic floor and abdominals and hold. Alternating legs, slide heel to bottom. Repeat _10__ times. Do _2__ times a day.  Bridge    Lie back, legs bent. Inhale, pressing hips up. Keeping ribs in, lengthen lower back. Exhale, rolling down along spine from top. Repeat __10__ times. Do __2__ sessions per day.

## 2021-03-08 NOTE — Therapy (Signed)
[place], [place], [address] Phone: [phone]   Fax:  [phone]  Physical Therapy Treatment  Patient Details  Name: Nicole [name] MRN: [id number] Date of Birth: 03-04-1946 Referring Provider (PT): Hazel Sams PA-C   Encounter Date: 03/08/2021   PT End of Session - 03/08/21 1438    Visit Number 2    Number of Visits 12    Date for PT Re-Evaluation 04/14/21    Authorization Type Primary Medicare Secondary Tricare (no auth, no VL)    Progress Note Due on Visit 10    PT Start Time 1052    PT Stop Time 1140    PT Time Calculation (min) 48 min    Activity Tolerance Patient limited by fatigue    Behavior During Therapy [facility] for tasks assessed/performed;Anxious           Past Medical History:  Diagnosis Date  . AC (acromioclavicular) joint bone spurs    lt shoulder  . Anemia   . Arthritis   . Carpal tunnel syndrome, bilateral   . CHF (congestive heart failure) ([place])   . Collagen vascular disease ([place])   . Gastroesophageal reflux   . Headache    recent visit to ER @ [facility] for severe headache  . Hypertension   . Lumbar stenosis    Hx of ESIs by Dr. Nelva Bush  . Polyarthralgia   . Polymyalgia ([place])   . Shingles   . Spinal stenosis     Past Surgical History:  Procedure Laterality Date  . BACK SURGERY  07/05/2018, 07/2018   x2   . BIOPSY  09/22/2020   Procedure: BIOPSY;  Surgeon: Rogene Houston, MD;  [facility];  Service: Endoscopy;;  . CHOLECYSTECTOMY    . COLONOSCOPY  06/11/2012   Procedure: COLONOSCOPY;  Surgeon: Jamesetta So, MD;  [facility];  Service: Gastroenterology;  Laterality: N/A;  . COLONOSCOPY WITH PROPOFOL N/A 09/22/2020   Procedure: COLONOSCOPY WITH PROPOFOL;  Surgeon: Rogene Houston, MD;  [facility];  Service: Endoscopy;  Laterality: N/A;  730  . HYSTEROSCOPY WITH D & C N/A 02/25/2014   Procedure: DILATATION AND CURETTAGE /HYSTEROSCOPY;   Surgeon: Florian Buff, MD;  [facility]: [facility];  Service: Gynecology;  Laterality: N/A;  . POLYPECTOMY N/A 02/25/2014   Procedure: POLYPECTOMY;  Surgeon: Florian Buff, MD;  Location: [facility];  Service: Gynecology;  Laterality: N/A;  . RESECTION DISTAL CLAVICAL Right 03/26/2015   Procedure: OPEN DISTAL CLAVICAL RESECTION ;  Surgeon: Netta Cedars, MD;  Location: [facility];  Service: Orthopedics;  Laterality: Right;    There were no vitals filed for this visit.   Subjective Assessment - 03/08/21 1104    Subjective Pt states she went to her primary following her last appt due to her increased edema in LE and breathing.  STates they went up on her fluid pills and her aide is now wrapping her Lt LE due to weeping.  States her Rt hip/LB and into buttock is painful today at 8/10.    Currently in Pain? Yes    Pain Score 8     Pain Location Buttocks    Pain Orientation Left    Pain Descriptors / Indicators Aching;Sore                             [facility] Adult PT Treatment/Exercise - 03/08/21 0001  Knee/Hip Exercises: Stretches   Piriformis Stretch Right;2 reps;20 seconds    Piriformis Stretch Limitations supine with towel and PT assist    Other Knee/Hip Stretches lower trunk rotations 5X10" holds      Knee/Hip Exercises: Supine   Heel Slides 1 set;10 reps    Bridges 10 reps                  PT Education - 03/08/21 1125    Education Details Reviewed goals, HEP and POC moving forward.  Updated HEP to include bridge, heelslides and trunk rotations.  Educated on compression and woundcare    Person(s) Educated Patient;Caregiver(s)    Methods Explanation    Comprehension Verbalized understanding            PT Short Term Goals - 03/03/21 1626      PT SHORT TERM GOAL #1   Title Patient will be independent with HEP in order to improve functional outcomes.    Time 3    Period Weeks    Status New    Target Date 03/24/21      PT SHORT TERM GOAL #2   Title Patient will  report at least 25% improvement in symptoms for improved quality of life.    Time 3    Period Weeks    Status New    Target Date 03/24/21             PT Long Term Goals - 03/03/21 1627      PT LONG TERM GOAL #1   Title Patient to demonsrate improvement of at least 1 MMT grade in all tested groups in order to improve gait and mobility     Time 6    Period Weeks    Status New    Target Date 04/14/21      PT LONG TERM GOAL #2   Title Patient will report at least 75% improvement in symptoms for improved quality of life.    Time 6    Period Weeks    Status New    Target Date 04/14/21      PT LONG TERM GOAL #3   Title Patient will be able to ambulate at least 50 feet to assist with household ambulation.    Time 6    Period Weeks    Status New    Target Date 04/14/21                 Plan - 03/08/21 1436    Clinical Impression Statement Pt comes today in wheelchair.  Able to transfer to mat with CGA and required mod assist with sit to supine transfer due to inability to lift LE's up into bed independently.  Edema and tenderness in Lt LE and wrapped with kerlix and coban appropriately from base of MTP's to patellar tendon.  Educated on compression (changing every 6 months) and on availability of woundcare here at this clinic if needed.  Reviewed goals and POC moving forward.  Established HEP completed in supine positioning.   Noted challenge with all exercises due to weakness and immobility. Need for rest break between each exercise due to fatigue and SOB.  Discussed wound and dressings with nursing aide at [facility]    Personal Factors and Comorbidities Age;Fitness;Behavior Pattern;Past/Current Experience;Comorbidity 3+;Social Background;Time since onset of injury/illness/exacerbation    Comorbidities CHF, RA, pitting edema, polymyalgia, sedentary, obesity    Examination-Activity Limitations Locomotion Level;Transfers;Bathing;Bed  Mobility;Stand;Stairs;Squat;Lift;Sit;Dressing;Hygiene/Grooming;Carry;Bend    Examination-Participation Restrictions Church;Meal Prep;Cleaning;Community Activity;Volunteer;Shop;Yard Work  Stability/Clinical Decision Making Unstable/Unpredictable    Rehab Potential Fair    PT Frequency 2x / week    PT Duration 6 weeks    PT Treatment/Interventions ADLs/Self Care Home Management;Aquatic Therapy;Canalith Repostioning;Cryotherapy;Electrical Stimulation;Iontophoresis 4mg /ml Dexamethasone;Moist Heat;Traction;Ultrasound;Parrafin;DME Instruction;Gait training;Stair training;Functional mobility training;Therapeutic activities;Therapeutic exercise;Balance training;Neuromuscular re-education;Patient/family education;Orthotic Fit/Training;Manual techniques;Manual lymph drainage;Compression bandaging;Scar mobilization;Passive range of motion;Dry needling;Energy conservation;Splinting;Taping;Vasopneumatic Device;Spinal Manipulations;Joint Manipulations    PT Next Visit Plan f/u with wound and how it is progressing/need for woundcare order.  Progress bilateral LE strengthening in seated and table exercises, begin standing exercise and gait as able, improve endurance/ activity tolerance as able    PT Home Exercise Plan 5/17:  supine bridge, heelslides, lower trunk rotations    Consulted and Agree with Plan of Care Patient;Family member/caregiver    Family Member Consulted niece and son           Patient will benefit from skilled therapeutic intervention in order to improve the following deficits and impairments:  Abnormal gait,Difficulty walking,Decreased range of motion,Decreased endurance,Increased muscle spasms,Pain,Decreased activity tolerance,Decreased skin integrity,Decreased balance,Impaired flexibility,Improper body mechanics,Postural dysfunction,Increased edema,Decreased strength,Decreased mobility  Visit Diagnosis: Muscle weakness (generalized)  Other abnormalities of gait and mobility  Low back  pain, unspecified back pain laterality, unspecified chronicity, unspecified whether sciatica present  Other symptoms and signs involving the musculoskeletal system  Bilateral hip pain     Problem List Patient Active Problem List   Diagnosis Date Noted  . Diverticulitis 05/02/2018  . Diverticulitis of colon 05/01/2018  . Morbid obesity with BMI of 50.0-59.9, adult ([place]) 05/01/2018  . Chronic diastolic HF (heart failure) ([place]) 05/01/2018  . On prednisone therapy 05/31/2017  . Positive anti-CCP test 05/31/2017  . Rheumatoid factor positive 05/31/2017  . Memory loss 04/04/2017  . Polymyalgia rheumatica ([place]) 04/04/2017  . High risk medication use 12/07/2016  . DDD (degenerative disc disease), lumbar 09/27/2016  . HTN (hypertension) 06/05/2016  . Chronic pain syndrome 06/05/2016  . Acute diverticulitis 06/02/2016  . Diverticulitis large intestine 06/02/2016  . Spinal stenosis of lumbosacral region 02/29/2016  . Joint pain 02/29/2016  . Endometrial polyp 02/06/2014  . Postmenopausal bleeding 01/30/2014  . GERD (gastroesophageal reflux disease) 09/29/2013  . Spinal stenosis, thoracic 09/29/2013  . Shingles 09/29/2013  . Thoracic or lumbosacral neuritis or radiculitis, unspecified 05/04/2011  . Abnormality of gait 05/04/2011  . Muscle weakness (generalized) 05/04/2011  . Bilateral primary osteoarthritis of knee 04/07/2009  . KNEE PAIN 04/07/2009   Teena Irani, PTA/CLT [phone]  Teena Irani 03/08/2021, 2:39 PM  [place] [address] [place], [place], [address] Phone: [phone]   Fax:  [phone]  Name: Nicole [name] MRN: [id number] Date of Birth: April 30, 1946

## 2021-03-11 ENCOUNTER — Other Ambulatory Visit: Payer: Self-pay

## 2021-03-11 ENCOUNTER — Encounter (HOSPITAL_COMMUNITY): Payer: Self-pay

## 2021-03-11 ENCOUNTER — Ambulatory Visit (HOSPITAL_COMMUNITY): Payer: Medicare Other

## 2021-03-11 DIAGNOSIS — R29898 Other symptoms and signs involving the musculoskeletal system: Secondary | ICD-10-CM | POA: Diagnosis not present

## 2021-03-11 DIAGNOSIS — M25551 Pain in right hip: Secondary | ICD-10-CM

## 2021-03-11 DIAGNOSIS — R2689 Other abnormalities of gait and mobility: Secondary | ICD-10-CM | POA: Diagnosis not present

## 2021-03-11 DIAGNOSIS — M25561 Pain in right knee: Secondary | ICD-10-CM

## 2021-03-11 DIAGNOSIS — M545 Low back pain, unspecified: Secondary | ICD-10-CM | POA: Diagnosis not present

## 2021-03-11 DIAGNOSIS — M6281 Muscle weakness (generalized): Secondary | ICD-10-CM

## 2021-03-11 DIAGNOSIS — M25552 Pain in left hip: Secondary | ICD-10-CM | POA: Diagnosis not present

## 2021-03-11 NOTE — Therapy (Signed)
[place], [place], [address] Phone: [phone]   Fax:  [phone]  Physical Therapy Treatment  Patient Details  Name: Nicole [name] MRN: [id number] Date of Birth: 12/26/1945 Referring Provider (PT): Hazel Sams PA-C   Encounter Date: 03/11/2021   PT End of Session - 03/11/21 0938    Visit Number 3    Number of Visits 12    Date for PT Re-Evaluation 04/14/21    Authorization Type Primary Medicare Secondary T[employer] (no auth, no VL)    Progress Note Due on Visit 10    PT Start Time 0915    PT Stop Time 1000    PT Time Calculation (min) 45 min    Equipment Utilized During Treatment Gait belt    Activity Tolerance Patient limited by fatigue;Patient tolerated treatment well    Behavior During Therapy [facility] for tasks assessed/performed;Anxious           Past Medical History:  Diagnosis Date  . AC (acromioclavicular) joint bone spurs    lt shoulder  . Anemia   . Arthritis   . Carpal tunnel syndrome, bilateral   . CHF (congestive heart failure) ([place])   . Collagen vascular disease ([place])   . Gastroesophageal reflux   . Headache    recent visit to ER @ [facility] for severe headache  . Hypertension   . Lumbar stenosis    Hx of ESIs by Dr. Nelva Bush  . Polyarthralgia   . Polymyalgia ([place])   . Shingles   . Spinal stenosis     Past Surgical History:  Procedure Laterality Date  . BACK SURGERY  07/05/2018, 07/2018   x2   . BIOPSY  09/22/2020   Procedure: BIOPSY;  Surgeon: Rogene Houston, MD;  [facility];  Service: Endoscopy;;  . CHOLECYSTECTOMY    . COLONOSCOPY  06/11/2012   Procedure: COLONOSCOPY;  Surgeon: Jamesetta So, MD;  [facility];  Service: Gastroenterology;  Laterality: N/A;  . COLONOSCOPY WITH PROPOFOL N/A 09/22/2020   Procedure: COLONOSCOPY WITH PROPOFOL;  Surgeon: Rogene Houston, MD;  [facility];  Service: Endoscopy;  Laterality: N/A;  730  . HYSTEROSCOPY  WITH D & C N/A 02/25/2014   Procedure: DILATATION AND CURETTAGE /HYSTEROSCOPY;  Surgeon: Florian Buff, MD;  [facility]: [facility];  Service: Gynecology;  Laterality: N/A;  . POLYPECTOMY N/A 02/25/2014   Procedure: POLYPECTOMY;  Surgeon: Florian Buff, MD;  Location: [facility];  Service: Gynecology;  Laterality: N/A;  . RESECTION DISTAL CLAVICAL Right 03/26/2015   Procedure: OPEN DISTAL CLAVICAL RESECTION ;  Surgeon: Netta Cedars, MD;  Location: [facility];  Service: Orthopedics;  Laterality: Right;    There were no vitals filed for this visit.   Subjective Assessment - 03/11/21 0923    Subjective Pt stated she has shooting pain from back to Rt groin/private areas, pain scale 7/10 today.  Stated her MD sent order for wound on Lt LE, arrived with coban and kerlix on LE, MD instructed her care giver to change dressings every two days.    Patient Stated Goals walk and drive car and lose weight    Currently in Pain? Yes    Pain Score 7     Pain Location Back    Pain Orientation Right    Pain Descriptors / Indicators Shooting    Pain Type Chronic pain    Pain Onset More than a month ago    Pain Frequency  Constant    Aggravating Factors  transfers    Pain Relieving Factors exercises, pain medication                             [facility] Adult PT Treatment/Exercise - 03/11/21 0001      Transfers   Transfers Stand Pivot Transfers    Stand Pivot Transfers 4: Min guard    Transfer Cueing CGA    Comments slow, labored, requires multiple attempts needed, difficulty coming to full standing, fatigued following, SOB increasing, stand pivot transfer to/from chair      Knee/Hip Exercises: Stretches   Other Knee/Hip Stretches lower trunk rotations 5X10" holds      Knee/Hip Exercises: Standing   Other Standing Knee Exercises Standing tolerance 2 times: 36" then 51"      Knee/Hip Exercises: Seated   Sit to Sand 2 sets;with UE support   standing tolerance 36" then 51"     Knee/Hip Exercises: Supine    Heel Slides 5 reps    Bridges 10 reps    Bridges Limitations 2 sets      Knee/Hip Exercises: Sidelying   Clams 5x5" assistance wiht Lt LE                    PT Short Term Goals - 03/03/21 1626      PT SHORT TERM GOAL #1   Title Patient will be independent with HEP in order to improve functional outcomes.    Time 3    Period Weeks    Status New    Target Date 03/24/21      PT SHORT TERM GOAL #2   Title Patient will report at least 25% improvement in symptoms for improved quality of life.    Time 3    Period Weeks    Status New    Target Date 03/24/21             PT Long Term Goals - 03/03/21 1627      PT LONG TERM GOAL #1   Title Patient to demonsrate improvement of at least 1 MMT grade in all tested groups in order to improve gait and mobility     Time 6    Period Weeks    Status New    Target Date 04/14/21      PT LONG TERM GOAL #2   Title Patient will report at least 75% improvement in symptoms for improved quality of life.    Time 6    Period Weeks    Status New    Target Date 04/14/21      PT LONG TERM GOAL #3   Title Patient will be able to ambulate at least 50 feet to assist with household ambulation.    Time 6    Period Weeks    Status New    Target Date 04/14/21                 Plan - 03/11/21 1007    Clinical Impression Statement Pt arrived in wheelchair with coban dressings on Lt LE.  Reports she talked to MD and order sent, checked and no referral received.  Gave pt. business card and instructed to call MD for wound referral.  Able to transfer to mat from [facility] with CGA and min A with BLE to mat.  Session focus on proximal strengthening and additional education on importance of posture, added tall sitting and scapular retraction  to HEP.  Timed standing tolerance x 2 with ability to stand for 36" then 51", cueing for posture as stands with trunk flexed over walker.  EOS pt reports pain reduced, no longer has pain in privates.     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    Surveyor, mining    Rehab Potential Fair    PT Frequency 2x / week    PT Duration 6 weeks    PT Treatment/Interventions ADLs/Self Care Home Management;Aquatic Therapy;Canalith Repostioning;Cryotherapy;Electrical Stimulation;Iontophoresis 4mg /ml Dexamethasone;Moist Heat;Traction;Ultrasound;Parrafin;DME Instruction;Gait training;Stair training;Functional mobility training;Therapeutic activities;Therapeutic exercise;Balance training;Neuromuscular re-education;Patient/family education;Orthotic Fit/Training;Manual techniques;Manual lymph drainage;Compression bandaging;Scar mobilization;Passive range of motion;Dry needling;Energy conservation;Splinting;Taping;Vasopneumatic Device;Spinal Manipulations;Joint Manipulations    PT Next Visit Plan f/u with wound and how it is progressing/need for woundcare order.  Progress bilateral LE strengthening in seated and table exercises, begin standing exercise and gait as able, improve endurance/ activity tolerance as able    PT Home Exercise Plan 5/17:  supine bridge, heelslides, lower trunk rotations; 5/20: sitting tall, scapular retraction    Consulted and Agree with Plan of Care Patient           Patient will benefit from skilled therapeutic intervention in order to improve the following deficits and impairments:  Abnormal gait,Difficulty walking,Decreased range of motion,Decreased  endurance,Increased muscle spasms,Pain,Decreased activity tolerance,Decreased skin integrity,Decreased balance,Impaired flexibility,Improper body mechanics,Postural dysfunction,Increased edema,Decreased strength,Decreased mobility  Visit Diagnosis: Muscle weakness (generalized)  Other abnormalities of gait and mobility  Low back pain, unspecified back pain laterality, unspecified chronicity, unspecified whether sciatica present  Other symptoms and signs involving the musculoskeletal system  Bilateral hip pain  Pain in both knees, unspecified chronicity     Problem List Patient Active Problem List   Diagnosis Date Noted  . Diverticulitis 05/02/2018  . Diverticulitis of colon 05/01/2018  . Morbid obesity with BMI of 50.0-59.9, adult ([place]) 05/01/2018  . Chronic diastolic HF (heart failure) ([place]) 05/01/2018  . On prednisone therapy 05/31/2017  . Positive anti-CCP test 05/31/2017  . Rheumatoid factor positive 05/31/2017  . Memory loss 04/04/2017  . Polymyalgia rheumatica ([place]) 04/04/2017  . High risk medication use 12/07/2016  . DDD (degenerative disc disease), lumbar 09/27/2016  . HTN (hypertension) 06/05/2016  . Chronic pain syndrome 06/05/2016  . Acute diverticulitis 06/02/2016  . Diverticulitis large intestine 06/02/2016  . Spinal stenosis of lumbosacral region 02/29/2016  . Joint pain 02/29/2016  . Endometrial polyp 02/06/2014  . Postmenopausal bleeding 01/30/2014  . GERD (gastroesophageal reflux disease) 09/29/2013  . Spinal stenosis, thoracic 09/29/2013  . Shingles 09/29/2013  . Thoracic or lumbosacral neuritis or radiculitis, unspecified 05/04/2011  . Abnormality of gait 05/04/2011  . Muscle weakness (generalized) 05/04/2011  . Bilateral primary osteoarthritis of knee 04/07/2009  . KNEE PAIN 04/07/2009   Ihor Austin, LPTA/CLT; CBIS [phone]  Aldona Lento 03/11/2021, 10:51 AM  [place], [place], [address] Phone: [phone]   Fax:  [phone]  Name: Nicole [name] MRN: [id number] Date of Birth: Jun 14, 1946

## 2021-03-14 ENCOUNTER — Ambulatory Visit (HOSPITAL_COMMUNITY): Payer: Medicare Other | Admitting: Physical Therapy

## 2021-03-14 NOTE — Progress Notes (Signed)
Cardiology Office Note  Date: 03/15/2021   ID: Jermisha Hoffart, DOB February 16, 1946, MRN [id number]  PCP:  Sharilyn Sites, MD  Cardiologist:  None Electrophysiologist:  None   Chief Complaint: Follow-up, CHF, near syncope, HTN  History of Present Illness: Avalynn Bowe is a 75 y.o. female with a history of CHF, near syncope, HTN, polymyalgia rheumatica.  Last seen by Dr. Bronson Ing on 04/17/2018 for evaluation of near syncope and CHF at request of PCP.  She is experiencing bilateral leg edema and exertional dyspnea.  She was taking Lasix 20 mg daily, losartan hydrochlorothiazide, with potassium supplementation.  An echocardiogram was orderedShowing EF 65 to 70%.  Grade 1 DD.  Mild concentric and moderate basal septal hypertrophy.  At last visit she was having a lot of trouble with her back with sciatica-like pain.  She had a recent emergency room visit to [facility] for the same complaints.  She was on Nucynta extended release 150 mg every 12 hours.  We do not have the records from that visit.   Patient is here for follow-up.  She states in the interim since last visit she has had back surgery twice.  She states also during the hospital visit for her back surgery she had a pulmonary embolism.  States she is continuing with some physical therapy but still having problems walking and weakness in lower legs..  Still has some issues with decreased sensation and function of her left leg.  She is in a wheelchair.  She is complaining of some lower extremity edema with some weeping in her left lower extremity.  She currently has a dressing on the left lower extremity and compression hose on her right lower extremity.  Her blood pressure is well controlled today at 110/70.   Past Medical History:  Diagnosis Date  . AC (acromioclavicular) joint bone spurs    lt shoulder  . Anemia   . Arthritis   . Carpal tunnel syndrome, bilateral   . CHF (congestive heart failure) ([place])   . Collagen vascular  disease ([place])   . Gastroesophageal reflux   . Headache    recent visit to ER @ [facility] for severe headache  . Hypertension   . Lumbar stenosis    Hx of ESIs by Dr. Nelva Bush  . Polyarthralgia   . Polymyalgia ([place])   . Shingles   . Spinal stenosis     Past Surgical History:  Procedure Laterality Date  . BACK SURGERY  07/05/2018, 07/2018   x2   . BIOPSY  09/22/2020   Procedure: BIOPSY;  Surgeon: Rogene Houston, MD;  [facility];  Service: Endoscopy;;  . CHOLECYSTECTOMY    . COLONOSCOPY  06/11/2012   Procedure: COLONOSCOPY;  Surgeon: Jamesetta So, MD;  [facility];  Service: Gastroenterology;  Laterality: N/A;  . COLONOSCOPY WITH PROPOFOL N/A 09/22/2020   Procedure: COLONOSCOPY WITH PROPOFOL;  Surgeon: Rogene Houston, MD;  [facility];  Service: Endoscopy;  Laterality: N/A;  730  . HYSTEROSCOPY WITH D & C N/A 02/25/2014   Procedure: DILATATION AND CURETTAGE /HYSTEROSCOPY;  Surgeon: Florian Buff, MD;  Location[facility];  Service: Gynecology;  Laterality: N/A;  . POLYPECTOMY N/A 02/25/2014   Procedure: POLYPECTOMY;  Surgeon: Florian Buff, MD;  Location[facility];  Service: Gynecology;  Laterality: N/A;  . RESECTION DISTAL CLAVICAL Right 03/26/2015   Procedure: OPEN DISTAL CLAVICAL RESECTION ;  Surgeon: Netta Cedars, MD;  Location: [facility];  Service: Orthopedics;  Laterality: Right;    Current Outpatient Medications  Medication Sig Dispense Refill  . acetaminophen (TYLENOL) 325 MG tablet Take 2 tablets (650 mg total) by mouth every 6 (six) hours as needed for mild pain or headache (or Fever >/= 101).    [name] atorvastatin (LIPITOR) 20 MG tablet SMARTSIG:1 Tablet(s) By Mouth Every Evening    . calcitRIOL (ROCALTROL) 0.25 MCG capsule Take 0.25 mcg by mouth 3 (three) times a week.     . cyclobenzaprine (FLEXERIL) 5 MG tablet Take 5 mg by mouth every 12 (twelve) hours as needed for muscle spasms.     . diclofenac (VOLTAREN) 75 MG EC tablet Take 75 mg by mouth 2  (two) times daily.     . diclofenac Sodium (VOLTAREN) 1 % GEL Apply 2 g topically 4 (four) times daily as needed (pain).     [name] gabapentin (NEURONTIN) 400 MG capsule Take 400 mg by mouth 3 (three) times daily.     [name] leflunomide (ARAVA) 10 MG tablet TAKE (1) TABLET BY MOUTH DAILY. 90 tablet 1  . lidocaine (LIDODERM) 5 % Place 1-2 patches onto the skin every 12 (twelve) hours as needed (pain).     . Multiple Vitamins-Minerals (MULTIVITAMIN PO) Take 1 tablet by mouth daily.     . pantoprazole (PROTONIX) 40 MG tablet Take 40 mg by mouth 2 (two) times daily.     . potassium chloride (KLOR-CON) 10 MEQ tablet Take 10 mEq by mouth daily.    . predniSONE (DELTASONE) 5 MG tablet TAKE 2 TABLETS BY MOUTH ONCE DAILY WITH BREAKFAST. 60 tablet 0  . Sarilumab (KEVZARA) 200 MG/1.14ML SOAJ Inject 200 mg into the skin every 14 (fourteen) days. 6 mL 0  . Simethicone (GAS-X PO) Take 1 tablet by mouth 3 (three) times daily as needed (flatulence).     . tapentadol (NUCYNTA) 50 MG tablet Take 50 mg by mouth daily as needed.    . Tapentadol HCl (NUCYNTA ER) 200 MG TB12 Take 200 mg by mouth every 12 (twelve) hours.    . torsemide (DEMADEX) 100 MG tablet Take 100mg  by mouth in the morning and 50mg  in the evening.    . torsemide (DEMADEX) 20 MG tablet Take 40 mg by mouth in the morning, at noon, and at bedtime.     No current facility-administered medications for this visit.   Allergies:  Oxycontin [oxycodone hcl], Sulfa antibiotics, Tramadol, Penicillins, and Sulfasalazine   Social History: The patient  reports that she has never smoked. She has never used smokeless tobacco. She reports that she does not drink alcohol and does not use drugs.   Family History: The patient's family history includes Arthritis in her sister; Diabetes in her paternal grandmother; Hypertension in her mother; Pneumonia in her father.   ROS:  Please see the history of present illness. Otherwise, complete review of systems is positive for none.   All other systems are reviewed and negative.   Physical Exam: VS:  BP 110/70   Pulse 94   Ht 5' (1.524 m)   Wt 254 lb 12.8 oz (115.6 kg)   SpO2 95%   BMI 49.76 kg/m , BMI Body mass index is 49.76 kg/m.  Wt Readings from Last 3 Encounters:  03/15/21 254 lb 12.8 oz (115.6 kg)  01/27/21 247 lb (112 kg)  11/19/20 242 lb (109.8 kg)    General: Morbidly obese patient appears comfortable at rest. Neck: Supple, no elevated JVP or carotid bruits, no thyromegaly. Lungs: Clear to auscultation, nonlabored breathing  at rest. Cardiac: Regular rate and rhythm, no S3 or significant systolic murmur, no pericardial rub. Extremities: Has bilateral lower extremity edema, distal pulses 2+. Skin: Warm and dry. Musculoskeletal: No kyphosis. Neuropsychiatric: Alert and oriented x3, affect grossly appropriate.  ECG:  An ECG dated 03/15/2021 was personally reviewed today and demonstrated:  Normal sinus rhythm rate of 94, left axis deviation, inferior infarct, age undetermined.  Recent Labwork: 01/27/2021: ALT 19; AST 19; BUN 28; Creat 1.36; Hemoglobin 13.6; Platelets 396; Potassium 4.2; Sodium 141  No results found for: CHOL, TRIG, HDL, CHOLHDL, VLDL, LDLCALC, LDLDIRECT  Other Studies Reviewed Today:  Echocardiogram 04/08/2020 1. Left ventricular ejection fraction, by estimation, is 60 to 65%. The left ventricle has normal function. The left ventricle has no regional wall motion abnormalities. There is mild left ventricular hypertrophy. Left ventricular diastolic parameters are consistent with Grade I diastolic dysfunction (impaired relaxation). 2. Right ventricular systolic function is normal. The right ventricular size is normal. There is normal pulmonary artery systolic pressure. 3. The mitral valve is normal in structure. Trivial mitral valve regurgitation. No evidence of mitral stenosis. 4. The aortic valve is tricuspid. Aortic valve regurgitation is not visualized. No aortic stenosis  is present. Comparison(s): Echocardiogram done 04/24/18 showed an EF of 70%.    Echocardiogram 04/24/2018 Study Conclusions  - Left ventricle: The cavity size was normal. Systolic function was  vigorous. The estimated ejection fraction was in the range of 65%  to 70%. Wall motion was normal; there were no regional wall  motion abnormalities. Doppler parameters are consistent with  abnormal left ventricular relaxation (grade 1 diastolic  dysfunction). Doppler parameters are consistent with high  ventricular filling pressure. Mild concentric and moderate basal  septal hypertrophy.  - Aortic valve: Moderately calcified annulus. Trileaflet. Valve  area (VTI): 2.08 cm^2. Valve area (Vmax): 2.11 cm^2. Valve area  (Vmean): 2.4 cm^2.  - Mitral valve: Calcified annulus. Mildly thickened leaflets .   Assessment and Plan:  1. Chronic diastolic heart failure ([place]) States she has had some recent increase in dyspnea.  She has not been active recently due to back surgery and subsequent PE.  She has gained some weight.  Today's weight was 254 compared to weight in January of 242.  Lungs are CTA all fields.  States she recently saw her nephrologist who had adjusted her torsemide dosage.  Current dosage is 100 mg a.m. and 50 mg p.m.  Please perform follow-up echocardiogram due to increased dyspnea.  2. Essential hypertension Blood pressure today 110/70.  Continue torsemide 100 mg a.m. and 50 mg p.m. per nephrology.  Continue potassium supplementation 10 mEq daily.  3.  Venous insufficiency. Patient has lower extremity edema and prominent varicosities with some weeping in her left lower extremity.  She currently has a dressing on her left lower extremity and compression stocking on the right lower extremity.  Offered to refer her to vein and vascular for assessment for possible treatment of venous insufficiency.  She wants to defer for now and will let us know at follow-up  Medication  Adjustments/Labs and Tests Ordered: Current medicines are reviewed at length with the patient today.  Concerns regarding medicines are outlined above.   Disposition: Follow-up with Dr Domenic Polite or APP 6 to 8 weeks Signed, Levell July, NP 03/15/2021 12:30 PM    [facility] [place], [place], [place] [address] Phone: [phone]; Fax: [phone]

## 2021-03-15 ENCOUNTER — Ambulatory Visit (INDEPENDENT_AMBULATORY_CARE_PROVIDER_SITE_OTHER): Payer: Medicare Other | Admitting: Family Medicine

## 2021-03-15 ENCOUNTER — Other Ambulatory Visit: Payer: Self-pay

## 2021-03-15 ENCOUNTER — Encounter: Payer: Self-pay | Admitting: Family Medicine

## 2021-03-15 ENCOUNTER — Ambulatory Visit (HOSPITAL_COMMUNITY): Payer: Medicare Other | Admitting: Physical Therapy

## 2021-03-15 ENCOUNTER — Encounter (HOSPITAL_COMMUNITY): Payer: Self-pay | Admitting: Physical Therapy

## 2021-03-15 VITALS — BP 110/70 | HR 94 | Ht 60.0 in | Wt 254.8 lb

## 2021-03-15 DIAGNOSIS — M25561 Pain in right knee: Secondary | ICD-10-CM

## 2021-03-15 DIAGNOSIS — R0602 Shortness of breath: Secondary | ICD-10-CM

## 2021-03-15 DIAGNOSIS — R29898 Other symptoms and signs involving the musculoskeletal system: Secondary | ICD-10-CM | POA: Diagnosis not present

## 2021-03-15 DIAGNOSIS — I10 Essential (primary) hypertension: Secondary | ICD-10-CM

## 2021-03-15 DIAGNOSIS — I872 Venous insufficiency (chronic) (peripheral): Secondary | ICD-10-CM

## 2021-03-15 DIAGNOSIS — M6281 Muscle weakness (generalized): Secondary | ICD-10-CM | POA: Diagnosis not present

## 2021-03-15 DIAGNOSIS — M25552 Pain in left hip: Secondary | ICD-10-CM | POA: Diagnosis not present

## 2021-03-15 DIAGNOSIS — R2689 Other abnormalities of gait and mobility: Secondary | ICD-10-CM | POA: Diagnosis not present

## 2021-03-15 DIAGNOSIS — M25562 Pain in left knee: Secondary | ICD-10-CM

## 2021-03-15 DIAGNOSIS — M545 Low back pain, unspecified: Secondary | ICD-10-CM | POA: Diagnosis not present

## 2021-03-15 DIAGNOSIS — M25551 Pain in right hip: Secondary | ICD-10-CM | POA: Diagnosis not present

## 2021-03-15 NOTE — Therapy (Signed)
[place], [place], [address] Phone: [phone]   Fax:  [phone]  Physical Therapy Treatment  Patient Details  Name: Nicole [name] MRN: [id number] Date of Birth: 1945/12/11 Referring Provider (PT): Hazel Sams PA-C   Encounter Date: 03/15/2021   PT End of Session - 03/15/21 1400    Visit Number 4    Number of Visits 12    Date for PT Re-Evaluation 04/14/21    Authorization Type Primary Medicare Secondary Tricare (no auth, no VL)    Progress Note Due on Visit 10    PT Start Time 1400    PT Stop Time 1440    PT Time Calculation (min) 40 min    Equipment Utilized During Treatment Gait belt    Activity Tolerance Patient limited by fatigue;Patient tolerated treatment well    Behavior During Therapy [facility] for tasks assessed/performed;Anxious           Past Medical History:  Diagnosis Date  . AC (acromioclavicular) joint bone spurs    lt shoulder  . Anemia   . Arthritis   . Carpal tunnel syndrome, bilateral   . CHF (congestive heart failure) ([place])   . Collagen vascular disease ([place])   . Gastroesophageal reflux   . Headache    recent visit to ER @ [facility] for severe headache  . Hypertension   . Lumbar stenosis    Hx of ESIs by Dr. Nelva Bush  . Polyarthralgia   . Polymyalgia ([place])   . Shingles   . Spinal stenosis     Past Surgical History:  Procedure Laterality Date  . BACK SURGERY  07/05/2018, 07/2018   x2   . BIOPSY  09/22/2020   Procedure: BIOPSY;  Surgeon: Rogene Houston, MD;  [facility];  Service: Endoscopy;;  . CHOLECYSTECTOMY    . COLONOSCOPY  06/11/2012   Procedure: COLONOSCOPY;  Surgeon: Jamesetta So, MD;  [facility];  Service: Gastroenterology;  Laterality: N/A;  . COLONOSCOPY WITH PROPOFOL N/A 09/22/2020   Procedure: COLONOSCOPY WITH PROPOFOL;  Surgeon: Rogene Houston, MD;  [facility];  Service: Endoscopy;  Laterality: N/A;  730  . HYSTEROSCOPY  WITH D & C N/A 02/25/2014   Procedure: DILATATION AND CURETTAGE /HYSTEROSCOPY;  Surgeon: Florian Buff, MD;  Location[facility];  Service: Gynecology;  Laterality: N/A;  . POLYPECTOMY N/A 02/25/2014   Procedure: POLYPECTOMY;  Surgeon: Florian Buff, MD;  Location: [facility];  Service: Gynecology;  Laterality: N/A;  . RESECTION DISTAL CLAVICAL Right 03/26/2015   Procedure: OPEN DISTAL CLAVICAL RESECTION ;  Surgeon: Netta Cedars, MD;  Location: [facility];  Service: Orthopedics;  Laterality: Right;    There were no vitals filed for this visit.   Subjective Assessment - 03/15/21 1401    Subjective She states her leg swelling has been going down. She went to heart MD today who stated her kidney function was down a bit and she will be getting EKG. Her blood pressure was down to normal. They increased her fluid meds. She is using compression on her RLE. She was able to walk about 26 feet today.    Patient Stated Goals walk and drive car and lose weight    Currently in Pain? Yes    Pain Location Back    Pain Orientation Right    Pain Descriptors / Indicators Burning    Pain Type Chronic pain    Pain Onset More than a month  ago    Pain Frequency Constant                             [facility] Adult PT Treatment/Exercise - 03/15/21 0001      Transfers   Transfers Sit to Stand    Sit to Stand 4: Min guard;4: Min assist      Knee/Hip Exercises: Standing   Other Standing Knee Exercises Standing tolerance 55 seconds, 30 seconds      Knee/Hip Exercises: Seated   Long Arc Quad Both;2 sets;5 reps    [employer] Limitations 5 second holds    Other Seated Knee/Hip Exercises hip add iso 5 x 10 second    Marching 10 reps;Both                  PT Education - 03/15/21 1401    Education Details HEP, exercise mechanics    Person(s) Educated Patient;Caregiver(s)    Methods Explanation;Demonstration    Comprehension Verbalized understanding;Returned demonstration            PT Short  Term Goals - 03/03/21 1626      PT SHORT TERM GOAL #1   Title Patient will be independent with HEP in order to improve functional outcomes.    Time 3    Period Weeks    Status New    Target Date 03/24/21      PT SHORT TERM GOAL #2   Title Patient will report at least 25% improvement in symptoms for improved quality of life.    Time 3    Period Weeks    Status New    Target Date 03/24/21             PT Long Term Goals - 03/03/21 1627      PT LONG TERM GOAL #1   Title Patient to demonsrate improvement of at least 1 MMT grade in all tested groups in order to improve gait and mobility     Time 6    Period Weeks    Status New    Target Date 04/14/21      PT LONG TERM GOAL #2   Title Patient will report at least 75% improvement in symptoms for improved quality of life.    Time 6    Period Weeks    Status New    Target Date 04/14/21      PT LONG TERM GOAL #3   Title Patient will be able to ambulate at least 50 feet to assist with household ambulation.    Time 6    Period Weeks    Status New    Target Date 04/14/21                 Plan - 03/15/21 1401    Clinical Impression Statement Patient with quick fatigue with both sitting and standing exercises. Patient highly motivated to improve function. Patient demonstrates limited but improving standing tolerance. Patient requires frequent cueing for rest breaks due to fatigue. Patient fatigued to 8-9/10 at end of session. Patient will continue to benefit from skilled physical therapy in order to reduce impairment and improve function.    Personal Factors and Comorbidities Age;Fitness;Behavior Pattern;Past/Current Experience;Comorbidity 3+;Social Background;Time since onset of injury/illness/exacerbation    Comorbidities CHF, RA, pitting edema, polymyalgia, sedentary, obesity    Examination-Activity Limitations Locomotion Level;Transfers;Bathing;Bed Mobility;Stand;Stairs;Squat;Lift;Sit;Dressing;Hygiene/Grooming;Carry;Bend     Examination-Participation Restrictions Church;Meal Prep;Cleaning;Community Activity;Volunteer;Shop;Yard Work    [employer]chant [employer] Unstable/Unpredictable  Rehab Potential Fair    PT Frequency 2x / week    PT Duration 6 weeks    PT Treatment/Interventions ADLs/Self Care Home Management;Aquatic Therapy;Canalith Repostioning;Cryotherapy;Electrical Stimulation;Iontophoresis 4mg /ml Dexamethasone;Moist Heat;Traction;Ultrasound;Parrafin;DME Instruction;Gait training;Stair training;Functional mobility training;Therapeutic activities;Therapeutic exercise;Balance training;Neuromuscular re-education;Patient/family education;Orthotic Fit/Training;Manual techniques;Manual lymph drainage;Compression bandaging;Scar mobilization;Passive range of motion;Dry needling;Energy conservation;Splinting;Taping;Vasopneumatic Device;Spinal Manipulations;Joint Manipulations    PT Next Visit Plan f/u with wound and how it is progressing/need for woundcare order.  Progress bilateral LE strengthening in seated and table exercises, begin standing exercise and gait as able, improve endurance/ activity tolerance as able    PT Home Exercise Plan 5/17:  supine bridge, heelslides, lower trunk rotations; 5/20: sitting tall, scapular retraction 5/24 marching, LAQ, hip add iso    Consulted and Agree with Plan of Care Patient           Patient will benefit from skilled therapeutic intervention in order to improve the following deficits and impairments:  Abnormal gait,Difficulty walking,Decreased range of motion,Decreased endurance,Increased muscle spasms,Pain,Decreased activity tolerance,Decreased skin integrity,Decreased balance,Impaired flexibility,Improper body mechanics,Postural dysfunction,Increased edema,Decreased strength,Decreased mobility  Visit Diagnosis: Muscle weakness (generalized)  Other abnormalities of gait and mobility  Low back pain, unspecified back pain laterality, unspecified chronicity,  unspecified whether sciatica present  Other symptoms and signs involving the musculoskeletal system  Bilateral hip pain  Pain in both knees, unspecified chronicity     Problem List Patient Active Problem List   Diagnosis Date Noted  . Diverticulitis 05/02/2018  . Diverticulitis of colon 05/01/2018  . Morbid obesity with BMI of 50.0-59.9, adult ([place]) 05/01/2018  . Chronic diastolic HF (heart failure) ([place]) 05/01/2018  . On prednisone therapy 05/31/2017  . Positive anti-CCP test 05/31/2017  . Rheumatoid factor positive 05/31/2017  . Memory loss 04/04/2017  . Polymyalgia rheumatica ([place]) 04/04/2017  . High risk medication use 12/07/2016  . DDD (degenerative disc disease), lumbar 09/27/2016  . HTN (hypertension) 06/05/2016  . Chronic pain syndrome 06/05/2016  . Acute diverticulitis 06/02/2016  . Diverticulitis large intestine 06/02/2016  . Spinal stenosis of lumbosacral region 02/29/2016  . Joint pain 02/29/2016  . Endometrial polyp 02/06/2014  . Postmenopausal bleeding 01/30/2014  . GERD (gastroesophageal reflux disease) 09/29/2013  . Spinal stenosis, thoracic 09/29/2013  . Shingles 09/29/2013  . Thoracic or lumbosacral neuritis or radiculitis, unspecified 05/04/2011  . Abnormality of gait 05/04/2011  . Muscle weakness (generalized) 05/04/2011  . Bilateral primary osteoarthritis of knee 04/07/2009  . KNEE PAIN 04/07/2009   2:44 PM, 03/15/21 Mearl Latin PT, DPT Physical Therapist at [facility] [place], [place], [address] Phone: [phone]   Fax:  [phone]  Name: Nicole [name] MRN: [id number] Date of Birth: Jul 05, 1946

## 2021-03-15 NOTE — Patient Instructions (Addendum)
Medication Instructions:    Your physician recommends that you continue on your current medications as directed. Please refer to the Current Medication list given to you today.  Labwork:  none  Testing/Procedures: Your physician has requested that you have an echocardiogram. Echocardiography is a painless test that uses sound waves to create images of your heart. It provides your doctor with information about the size and shape of your heart and how well your heart's chambers and valves are working. This procedure takes approximately one hour. There are no restrictions for this procedure  Follow-Up:  Your physician recommends that you schedule a follow-up appointment in: 6-8 weeks.  Any Other Special Instructions Will Be Listed Below (If Applicable).  If you need a refill on your cardiac medications before your next appointment, please call your pharmacy.

## 2021-03-15 NOTE — Patient Instructions (Signed)
Access Code: [email or web address] URL: [email or web address] Date: 03/15/2021 Prepared by: Margie Billet  Exercises Seated March - 2 x daily - 7 x weekly - 1 sets - 10 reps Seated Long Arc Quad - 2 x daily - 7 x weekly - 1 sets - 10 reps - 5 second hold Seated Hip Adduction Isometrics with Ball - 2 x daily - 7 x weekly - 5 reps - 10 second hold

## 2021-03-17 ENCOUNTER — Telehealth: Payer: Self-pay | Admitting: Rheumatology

## 2021-03-17 ENCOUNTER — Ambulatory Visit (HOSPITAL_COMMUNITY): Payer: Medicare Other | Admitting: Physical Therapy

## 2021-03-17 ENCOUNTER — Telehealth: Payer: Self-pay | Admitting: Family Medicine

## 2021-03-17 ENCOUNTER — Encounter (HOSPITAL_COMMUNITY): Payer: Self-pay | Admitting: Physical Therapy

## 2021-03-17 ENCOUNTER — Other Ambulatory Visit: Payer: Self-pay

## 2021-03-17 DIAGNOSIS — Z6841 Body Mass Index (BMI) 40.0 and over, adult: Secondary | ICD-10-CM | POA: Diagnosis not present

## 2021-03-17 DIAGNOSIS — M25561 Pain in right knee: Secondary | ICD-10-CM

## 2021-03-17 DIAGNOSIS — R2689 Other abnormalities of gait and mobility: Secondary | ICD-10-CM

## 2021-03-17 DIAGNOSIS — Z91048 Other nonmedicinal substance allergy status: Secondary | ICD-10-CM | POA: Diagnosis not present

## 2021-03-17 DIAGNOSIS — M25551 Pain in right hip: Secondary | ICD-10-CM | POA: Diagnosis not present

## 2021-03-17 DIAGNOSIS — M25562 Pain in left knee: Secondary | ICD-10-CM

## 2021-03-17 DIAGNOSIS — M6281 Muscle weakness (generalized): Secondary | ICD-10-CM | POA: Diagnosis not present

## 2021-03-17 DIAGNOSIS — M25552 Pain in left hip: Secondary | ICD-10-CM | POA: Diagnosis not present

## 2021-03-17 DIAGNOSIS — M545 Low back pain, unspecified: Secondary | ICD-10-CM

## 2021-03-17 DIAGNOSIS — R29898 Other symptoms and signs involving the musculoskeletal system: Secondary | ICD-10-CM

## 2021-03-17 NOTE — Telephone Encounter (Signed)
Opened in error

## 2021-03-17 NOTE — Therapy (Signed)
[place], [place], [address] Phone: [phone]   Fax:  [phone]  Physical Therapy Treatment  Patient Details  Name: Nicole [name] MRN: [id number] Date of Birth: 17-Nov-1945 Referring Provider (PT): Hazel Sams PA-C   Encounter Date: 03/17/2021   PT End of Session - 03/17/21 1530    Visit Number 5    Number of Visits 12    Date for PT Re-Evaluation 04/14/21    Authorization Type Primary Medicare Secondary Tricare (no auth, no VL)    Progress Note Due on Visit 10    PT Start Time 1530    PT Stop Time 1610    PT Time Calculation (min) 40 min    Equipment Utilized During Treatment Gait belt    Activity Tolerance Patient limited by fatigue;Patient tolerated treatment well    Behavior During Therapy [facility] for tasks assessed/performed;Anxious           Past Medical History:  Diagnosis Date  . AC (acromioclavicular) joint bone spurs    lt shoulder  . Anemia   . Arthritis   . Carpal tunnel syndrome, bilateral   . CHF (congestive heart failure) ([place])   . Collagen vascular disease ([place])   . Gastroesophageal reflux   . Headache    recent visit to ER @ [facility] for severe headache  . Hypertension   . Lumbar stenosis    Hx of ESIs by Dr. Nelva Bush  . Polyarthralgia   . Polymyalgia ([place])   . Shingles   . Spinal stenosis     Past Surgical History:  Procedure Laterality Date  . BACK SURGERY  07/05/2018, 07/2018   x2   . BIOPSY  09/22/2020   Procedure: BIOPSY;  Surgeon: Rogene Houston, MD;  [facility];  Service: Endoscopy;;  . CHOLECYSTECTOMY    . COLONOSCOPY  06/11/2012   Procedure: COLONOSCOPY;  Surgeon: Jamesetta So, MD;  [facility];  Service: Gastroenterology;  Laterality: N/A;  . COLONOSCOPY WITH PROPOFOL N/A 09/22/2020   Procedure: COLONOSCOPY WITH PROPOFOL;  Surgeon: Rogene Houston, MD;  [facility];  Service: Endoscopy;  Laterality: N/A;  730  . HYSTEROSCOPY  WITH D & C N/A 02/25/2014   Procedure: DILATATION AND CURETTAGE /HYSTEROSCOPY;  Surgeon: Florian Buff, MD;  [facility];  Service: Gynecology;  Laterality: N/A;  . POLYPECTOMY N/A 02/25/2014   Procedure: POLYPECTOMY;  Surgeon: Florian Buff, MD;  Location[facility];  Service: Gynecology;  Laterality: N/A;  . RESECTION DISTAL CLAVICAL Right 03/26/2015   Procedure: OPEN DISTAL CLAVICAL RESECTION ;  Surgeon: Netta Cedars, MD;  Location: [facility];  Service: Orthopedics;  Laterality: Right;    There were no vitals filed for this visit.   Subjective Assessment - 03/17/21 1532    Subjective Patient states today has been a long day. She went to get an EKG today and had an allergic reaction from the adhesive on her leads.    Patient Stated Goals walk and drive car and lose weight    Currently in Pain? Yes    Pain Score 6     Pain Location Back    Pain Orientation Right    Pain Descriptors / Indicators Burning    Pain Type Chronic pain    Pain Onset More than a month ago    Pain Frequency Constant  [facility] Adult PT Treatment/Exercise - 03/17/21 0001      Knee/Hip Exercises: Stretches   Other Knee/Hip Stretches lower trunk rotations 5X10" holds      Knee/Hip Exercises: Seated   Long Arc Quad Both;1 set;5 reps    Long [employer] Limitations 5 second holds      Knee/Hip Exercises: Supine   Bridges 10 reps    Bridges Limitations 2 sets    Other Supine Knee/Hip Exercises marching 1x 10 bilateral    Other Supine Knee/Hip Exercises clams with green band 10x 5 second holds; hamstring iso 10x 5 second holds                  PT Education - 03/17/21 1531    Education Details HEP, exercise mechanics    Person(s) Educated Patient    Methods Explanation;Demonstration    Comprehension Verbalized understanding;Returned demonstration            PT Short Term Goals - 03/03/21 1626      PT SHORT TERM GOAL #1   Title Patient will be independent with  HEP in order to improve functional outcomes.    Time 3    Period Weeks    Status New    Target Date 03/24/21      PT SHORT TERM GOAL #2   Title Patient will report at least 25% improvement in symptoms for improved quality of life.    Time 3    Period Weeks    Status New    Target Date 03/24/21             PT Long Term Goals - 03/03/21 1627      PT LONG TERM GOAL #1   Title Patient to demonsrate improvement of at least 1 MMT grade in all tested groups in order to improve gait and mobility     Time 6    Period Weeks    Status New    Target Date 04/14/21      PT LONG TERM GOAL #2   Title Patient will report at least 75% improvement in symptoms for improved quality of life.    Time 6    Period Weeks    Status New    Target Date 04/14/21      PT LONG TERM GOAL #3   Title Patient will be able to ambulate at least 50 feet to assist with household ambulation.    Time 6    Period Weeks    Status New    Target Date 04/14/21                 Plan - 03/17/21 1530    Clinical Impression Statement Completed table exercises as patient has been having difficulty with bed mobility and also has c/o fatigue from her day today. Patient with difficulty with marching secondary to c/o R anterior hip pain. Continued with hip strengthening. Patient requires frequent LE repositioning with LEs on green ball with hamstring isometrics. Patient fatigued at end of session. Patient will continue to benefit from skilled physical therapy in order to reduce impairment and improve function.    Personal Factors and Comorbidities Age;Fitness;Behavior Pattern;Past/Current Experience;Comorbidity 3+;Social Background;Time since onset of injury/illness/exacerbation    Comorbidities CHF, RA, pitting edema, polymyalgia, sedentary, obesity    Examination-Activity Limitations Locomotion Level;Transfers;Bathing;Bed Mobility;Stand;Stairs;Squat;Lift;Sit;Dressing;Hygiene/Grooming;Carry;Bend     Examination-Participation Restrictions Church;Meal Prep;Cleaning;Community Activity;Volunteer;Shop;Yard Work    Stability/Clinical Decision Making Unstable/Unpredictable    Rehab Potential Fair    PT Frequency 2x /  week    PT Duration 6 weeks    PT Treatment/Interventions ADLs/Self Care Home Management;Aquatic Therapy;Canalith Repostioning;Cryotherapy;Electrical Stimulation;Iontophoresis 4mg /ml Dexamethasone;Moist Heat;Traction;Ultrasound;Parrafin;DME Instruction;Gait training;Stair training;Functional mobility training;Therapeutic activities;Therapeutic exercise;Balance training;Neuromuscular re-education;Patient/family education;Orthotic Fit/Training;Manual techniques;Manual lymph drainage;Compression bandaging;Scar mobilization;Passive range of motion;Dry needling;Energy conservation;Splinting;Taping;Vasopneumatic Device;Spinal Manipulations;Joint Manipulations    PT Next Visit Plan f/u with wound and how it is progressing/need for woundcare order.  Progress bilateral LE strengthening in seated and table exercises, begin standing exercise and gait as able, improve endurance/ activity tolerance as able    PT Home Exercise Plan 5/17:  supine bridge, heelslides, lower trunk rotations; 5/20: sitting tall, scapular retraction 5/24 marching, LAQ, hip add iso    Consulted and Agree with Plan of Care Patient           Patient will benefit from skilled therapeutic intervention in order to improve the following deficits and impairments:  Abnormal gait,Difficulty walking,Decreased range of motion,Decreased endurance,Increased muscle spasms,Pain,Decreased activity tolerance,Decreased skin integrity,Decreased balance,Impaired flexibility,Improper body mechanics,Postural dysfunction,Increased edema,Decreased strength,Decreased mobility  Visit Diagnosis: Muscle weakness (generalized)  Other abnormalities of gait and mobility  Low back pain, unspecified back pain laterality, unspecified chronicity,  unspecified whether sciatica present  Other symptoms and signs involving the musculoskeletal system  Bilateral hip pain  Pain in both knees, unspecified chronicity     Problem List Patient Active Problem List   Diagnosis Date Noted  . Diverticulitis 05/02/2018  . Diverticulitis of colon 05/01/2018  . Morbid obesity with BMI of 50.0-59.9, adult ([place]) 05/01/2018  . Chronic diastolic HF (heart failure) ([place]) 05/01/2018  . On prednisone therapy 05/31/2017  . Positive anti-CCP test 05/31/2017  . Rheumatoid factor positive 05/31/2017  . Memory loss 04/04/2017  . Polymyalgia rheumatica ([place]) 04/04/2017  . High risk medication use 12/07/2016  . DDD (degenerative disc disease), lumbar 09/27/2016  . HTN (hypertension) 06/05/2016  . Chronic pain syndrome 06/05/2016  . Acute diverticulitis 06/02/2016  . Diverticulitis large intestine 06/02/2016  . Spinal stenosis of lumbosacral region 02/29/2016  . Joint pain 02/29/2016  . Endometrial polyp 02/06/2014  . Postmenopausal bleeding 01/30/2014  . GERD (gastroesophageal reflux disease) 09/29/2013  . Spinal stenosis, thoracic 09/29/2013  . Shingles 09/29/2013  . Thoracic or lumbosacral neuritis or radiculitis, unspecified 05/04/2011  . Abnormality of gait 05/04/2011  . Muscle weakness (generalized) 05/04/2011  . Bilateral primary osteoarthritis of knee 04/07/2009  . KNEE PAIN 04/07/2009    4:07 PM, 03/17/21 Mearl Latin PT, DPT Physical Therapist at [facility] [place], [place], [address] Phone: [phone]   Fax:  [phone]  Name: Neziah Vogelgesang MRN: [id number] Date of Birth: [birth year]

## 2021-03-17 NOTE — Telephone Encounter (Signed)
Patient called in regards to the EKG she had the other day. States that where the strips were placed on her that she thinks she has had a reaction . States that her skin was red but now its like she has blisters on her skin.

## 2021-03-17 NOTE — Telephone Encounter (Signed)
Reports rash under both breasts and left is worse than right. Reports having some open blisters under left breast. Advised to keep hands and fingers clean before touching. Keep rash under breast clean and dry and apply gauze areas with blisters. Advised to contact PCP for an evaluation. Verbalized understanding of plan.

## 2021-03-17 NOTE — Telephone Encounter (Signed)
Agree with your assessment.  Thank you 

## 2021-03-23 ENCOUNTER — Ambulatory Visit (HOSPITAL_COMMUNITY): Payer: Medicare Other | Attending: Physician Assistant

## 2021-03-23 ENCOUNTER — Other Ambulatory Visit: Payer: Self-pay

## 2021-03-23 ENCOUNTER — Encounter (HOSPITAL_COMMUNITY): Payer: Self-pay

## 2021-03-23 DIAGNOSIS — I890 Lymphedema, not elsewhere classified: Secondary | ICD-10-CM | POA: Insufficient documentation

## 2021-03-23 DIAGNOSIS — M25562 Pain in left knee: Secondary | ICD-10-CM | POA: Insufficient documentation

## 2021-03-23 DIAGNOSIS — M25561 Pain in right knee: Secondary | ICD-10-CM | POA: Diagnosis not present

## 2021-03-23 DIAGNOSIS — M545 Low back pain, unspecified: Secondary | ICD-10-CM | POA: Diagnosis not present

## 2021-03-23 DIAGNOSIS — M25552 Pain in left hip: Secondary | ICD-10-CM | POA: Insufficient documentation

## 2021-03-23 DIAGNOSIS — M25551 Pain in right hip: Secondary | ICD-10-CM | POA: Diagnosis not present

## 2021-03-23 DIAGNOSIS — M6281 Muscle weakness (generalized): Secondary | ICD-10-CM

## 2021-03-23 DIAGNOSIS — R2689 Other abnormalities of gait and mobility: Secondary | ICD-10-CM | POA: Diagnosis not present

## 2021-03-23 DIAGNOSIS — R29898 Other symptoms and signs involving the musculoskeletal system: Secondary | ICD-10-CM | POA: Diagnosis not present

## 2021-03-23 NOTE — Therapy (Signed)
[facility] [address] [place], [place], [address] Phone: [phone]   Fax:  [phone]  Physical Therapy Treatment  Patient Details  Name: Nicole [name] MRN: [id number] Date of Birth: 06-Aug-1946 Referring Provider (PT): Hazel Sams PA-C   Encounter Date: 03/23/2021   PT End of Session - 03/23/21 1120    Visit Number 6    Number of Visits 12    Date for PT Re-Evaluation 04/14/21    Authorization Type Primary Medicare Secondary Tricare (no auth, no VL)    Progress Note Due on Visit 10    PT Start Time 1048    PT Stop Time 1138    PT Time Calculation (min) 50 min    Equipment Utilized During Treatment Gait belt    Activity Tolerance Patient limited by fatigue;Patient tolerated treatment well    Behavior During Therapy [facility] for tasks assessed/performed           Past Medical History:  Diagnosis Date  . AC (acromioclavicular) joint bone spurs    lt shoulder  . Anemia   . Arthritis   . Carpal tunnel syndrome, bilateral   . CHF (congestive heart failure) ([place])   . Collagen vascular disease ([place])   . Gastroesophageal reflux   . Headache    recent visit to ER @ [facility] for severe headache  . Hypertension   . Lumbar stenosis    Hx of ESIs by Dr. Nelva Bush  . Polyarthralgia   . Polymyalgia ([place])   . Shingles   . Spinal stenosis     Past Surgical History:  Procedure Laterality Date  . BACK SURGERY  07/05/2018, 07/2018   x2   . BIOPSY  09/22/2020   Procedure: BIOPSY;  Surgeon: Rogene Houston, MD;  [facility];  Service: Endoscopy;;  . CHOLECYSTECTOMY    . COLONOSCOPY  06/11/2012   Procedure: COLONOSCOPY;  Surgeon: Jamesetta So, MD;  [facility];  Service: Gastroenterology;  Laterality: N/A;  . COLONOSCOPY WITH PROPOFOL N/A 09/22/2020   Procedure: COLONOSCOPY WITH PROPOFOL;  Surgeon: Rogene Houston, MD;  [facility];  Service: Endoscopy;  Laterality: N/A;  730  . HYSTEROSCOPY WITH D & C  N/A 02/25/2014   Procedure: DILATATION AND CURETTAGE /HYSTEROSCOPY;  Surgeon: Florian Buff, MD;  Location: [facility];  Service: Gynecology;  Laterality: N/A;  . POLYPECTOMY N/A 02/25/2014   Procedure: POLYPECTOMY;  Surgeon: Florian Buff, MD;  Location: [facility];  Service: Gynecology;  Laterality: N/A;  . RESECTION DISTAL CLAVICAL Right 03/26/2015   Procedure: OPEN DISTAL CLAVICAL RESECTION ;  Surgeon: Netta Cedars, MD;  Location: [facility];  Service: Orthopedics;  Laterality: Right;    There were no vitals filed for this visit.   Subjective Assessment - 03/23/21 1100    Subjective Reports some pain lower back that crosses across Rt groin.  Reoprts she had an EKG and had a allergice reaction from the adhesive under both her breast.  Arrived with wound and reports of wheeping Lt LE.  Reports recent pain following pop in Lt toe.    Patient Stated Goals walk and drive car and lose weight    Currently in Pain? Yes    Pain Score 7     Pain Location Back   LBP crossing into Rt groin   Pain Orientation Lower;Right    Pain Descriptors / Indicators Pressure;Sore    Pain Type Chronic pain    Pain Onset More than  a month ago    Pain Frequency Constant    Aggravating Factors  transfers    Pain Relieving Factors exercises, pain medicatoin    Effect of Pain on Daily Activities limits                             [facility] Adult PT Treatment/Exercise - 03/23/21 0001      Transfers   Transfers Sit to Stand    Sit to Stand 4: Min guard    Transfer Cueing CGA      Ambulation/Gait   Ambulation Distance (Feet) 28 Feet    Assistive device Rolling walker    Gait Pattern Decreased stride length;Trunk flexed;Poor foot clearance - left;Poor foot clearance - right    Ambulation Surface Level;Indoor      Knee/Hip Exercises: Stretches   Other Knee/Hip Stretches lower trunk rotations 5X10" holds      Knee/Hip Exercises: Standing   Other Standing Knee Exercises standing tolerance x1 min    Other  Standing Knee Exercises Sidestep front of mat      Knee/Hip Exercises: Supine   Bridges 10 reps    Bridges Limitations 2 sets    Other Supine Knee/Hip Exercises marching 1x 10 bilateral                    PT Short Term Goals - 03/03/21 1626      PT SHORT TERM GOAL #1   Title Patient will be independent with HEP in order to improve functional outcomes.    Time 3    Period Weeks    Status New    Target Date 03/24/21      PT SHORT TERM GOAL #2   Title Patient will report at least 25% improvement in symptoms for improved quality of life.    Time 3    Period Weeks    Status New    Target Date 03/24/21             PT Long Term Goals - 03/03/21 1627      PT LONG TERM GOAL #1   Title Patient to demonsrate improvement of at least 1 MMT grade in all tested groups in order to improve gait and mobility     Time 6    Period Weeks    Status New    Target Date 04/14/21      PT LONG TERM GOAL #2   Title Patient will report at least 75% improvement in symptoms for improved quality of life.    Time 6    Period Weeks    Status New    Target Date 04/14/21      PT LONG TERM GOAL #3   Title Patient will be able to ambulate at least 50 feet to assist with household ambulation.    Time 6    Period Weeks    Status New    Target Date 04/14/21                 Plan - 03/23/21 1205    Clinical Impression Statement Discussed beginning wound care on Lt LE, pt reports wheeping.  Neice currently does wound dressings with gauze and coban, pt stated she would like to begin wound care possible lymphedema care if appropriate as well as continue strengthening exercises.  Session focus wiht hip strengthening and mobility.  Pt demonstrate significant weakness with therex as well as decreased activity tolerance with ability  to stand for 1 min prior need to rest.  Improved gait distance with RW today, cueing for posture with all standing exercises today.  Was limited by fatigue at EOS.     Comorbidities CHF, RA, pitting edema, polymyalgia, sedentary, obesity    Examination-Activity Limitations Locomotion Level;Transfers;Bathing;Bed Mobility;Stand;Stairs;Squat;Lift;Sit;Dressing;Hygiene/Grooming;Carry;Bend    Examination-Participation Restrictions Church;Meal Prep;Cleaning;Community Activity;Volunteer;Shop;Yard Work    Merchant navy officer Unstable/Unpredictable    Surveyor, mining    Rehab Potential Fair    PT Frequency 2x / week    PT Duration 6 weeks    PT Treatment/Interventions ADLs/Self Care Home Management;Aquatic Therapy;Canalith Repostioning;Cryotherapy;Electrical Stimulation;Iontophoresis 4mg /ml Dexamethasone;Moist Heat;Traction;Ultrasound;Parrafin;DME Instruction;Gait training;Stair training;Functional mobility training;Therapeutic activities;Therapeutic exercise;Balance training;Neuromuscular re-education;Patient/family education;Orthotic Fit/Training;Manual techniques;Manual lymph drainage;Compression bandaging;Scar mobilization;Passive range of motion;Dry needling;Energy conservation;Splinting;Taping;Vasopneumatic Device;Spinal Manipulations;Joint Manipulations    PT Next Visit Plan Wound/lymph eval next session.  Progress LE strengthening, standing tolerance, gait, improve activity tolerance as able.    PT Home Exercise Plan 5/17:  supine bridge, heelslides, lower trunk rotations; 5/20: sitting tall, scapular retraction 5/24 marching, LAQ, hip add iso    Consulted and Agree with Plan of Care Patient           Patient will benefit from skilled therapeutic intervention in order to improve the following deficits and impairments:  Abnormal gait,Difficulty walking,Decreased range of motion,Decreased endurance,Increased muscle spasms,Pain,Decreased activity tolerance,Decreased skin integrity,Decreased balance,Impaired flexibility,Improper body mechanics,Postural dysfunction,Increased edema,Decreased strength,Decreased mobility  Visit  Diagnosis: Muscle weakness (generalized)  Other abnormalities of gait and mobility  Low back pain, unspecified back pain laterality, unspecified chronicity, unspecified whether sciatica present  Other symptoms and signs involving the musculoskeletal system  Bilateral hip pain  Pain in both knees, unspecified chronicity     Problem List Patient Active Problem List   Diagnosis Date Noted  . Diverticulitis 05/02/2018  . Diverticulitis of colon 05/01/2018  . Morbid obesity with BMI of 50.0-59.9, adult ([place]) 05/01/2018  . Chronic diastolic HF (heart failure) ([place]) 05/01/2018  . On prednisone therapy 05/31/2017  . Positive anti-CCP test 05/31/2017  . Rheumatoid factor positive 05/31/2017  . Memory loss 04/04/2017  . Polymyalgia rheumatica ([place]) 04/04/2017  . High risk medication use 12/07/2016  . DDD (degenerative disc disease), lumbar 09/27/2016  . HTN (hypertension) 06/05/2016  . Chronic pain syndrome 06/05/2016  . Acute diverticulitis 06/02/2016  . Diverticulitis large intestine 06/02/2016  . Spinal stenosis of lumbosacral region 02/29/2016  . Joint pain 02/29/2016  . Endometrial polyp 02/06/2014  . Postmenopausal bleeding 01/30/2014  . GERD (gastroesophageal reflux disease) 09/29/2013  . Spinal stenosis, thoracic 09/29/2013  . Shingles 09/29/2013  . Thoracic or lumbosacral neuritis or radiculitis, unspecified 05/04/2011  . Abnormality of gait 05/04/2011  . Muscle weakness (generalized) 05/04/2011  . Bilateral primary osteoarthritis of knee 04/07/2009  . KNEE PAIN 04/07/2009   Ihor Austin, LPTA/CLT; CBIS [phone]  Aldona Lento 03/23/2021, 1:14 PM  [place] [place], [place], [address] Phone: [phone]   Fax:  [phone]  Name: Nicole [name] MRN: [id number] Date of Birth: [birth year]

## 2021-03-25 ENCOUNTER — Encounter (HOSPITAL_COMMUNITY): Payer: Self-pay | Admitting: Physical Therapy

## 2021-03-25 ENCOUNTER — Ambulatory Visit (HOSPITAL_COMMUNITY): Payer: Medicare Other | Admitting: Physical Therapy

## 2021-03-25 ENCOUNTER — Other Ambulatory Visit: Payer: Self-pay

## 2021-03-25 ENCOUNTER — Other Ambulatory Visit: Payer: Self-pay | Admitting: Rheumatology

## 2021-03-25 DIAGNOSIS — M545 Low back pain, unspecified: Secondary | ICD-10-CM | POA: Diagnosis not present

## 2021-03-25 DIAGNOSIS — M25552 Pain in left hip: Secondary | ICD-10-CM | POA: Diagnosis not present

## 2021-03-25 DIAGNOSIS — M6281 Muscle weakness (generalized): Secondary | ICD-10-CM | POA: Diagnosis not present

## 2021-03-25 DIAGNOSIS — I890 Lymphedema, not elsewhere classified: Secondary | ICD-10-CM

## 2021-03-25 DIAGNOSIS — M25551 Pain in right hip: Secondary | ICD-10-CM | POA: Diagnosis not present

## 2021-03-25 DIAGNOSIS — R29898 Other symptoms and signs involving the musculoskeletal system: Secondary | ICD-10-CM | POA: Diagnosis not present

## 2021-03-25 DIAGNOSIS — R2689 Other abnormalities of gait and mobility: Secondary | ICD-10-CM | POA: Diagnosis not present

## 2021-03-25 NOTE — Telephone Encounter (Signed)
Next Visit: 05/12/2021  Last Visit: 01/27/2021  Last Fill: 02/18/2021  Dx:  Rheumatoid arthritis involving multiple sites with positive rheumatoid factor   Current Dose per office note on 01/27/2021, prednisone 5 mg 2 tablets daily  Okay to refill prednisone?

## 2021-03-25 NOTE — Therapy (Signed)
[facility] [address] [place], [place], [address] Phone: [phone]   Fax:  [phone]  Physical Therapy Evaluation  Patient Details  Name: Nicole [name] MRN: [id number] Date of Birth: 1946-07-04 Referring Provider (PT): Maren Reamer   Encounter Date: 03/25/2021   PT End of Session - 03/25/21 1328    Visit Number 1    Number of Visits 9    Date for PT Re-Evaluation 04/15/21    Authorization Type Primary Medicare Secondary Tricare (no auth, no VL)    Progress Note Due on Visit 9    PT Start Time 1050    PT Stop Time 1135    PT Time Calculation (min) 45 min    Activity Tolerance Patient tolerated treatment well    Behavior During Therapy [facility] for tasks assessed/performed           Past Medical History:  Diagnosis Date  . AC (acromioclavicular) joint bone spurs    lt shoulder  . Anemia   . Arthritis   . Carpal tunnel syndrome, bilateral   . CHF (congestive heart failure) ([place])   . Collagen vascular disease ([place])   . Gastroesophageal reflux   . Headache    recent visit to ER @ [facility] for severe headache  . Hypertension   . Lumbar stenosis    Hx of ESIs by Dr. Nelva Bush  . Polyarthralgia   . Polymyalgia ([place])   . Shingles   . Spinal stenosis     Past Surgical History:  Procedure Laterality Date  . BACK SURGERY  07/05/2018, 07/2018   x2   . BIOPSY  09/22/2020   Procedure: BIOPSY;  Surgeon: Rogene Houston, MD;  [facility];  Service: Endoscopy;;  . CHOLECYSTECTOMY    . COLONOSCOPY  06/11/2012   Procedure: COLONOSCOPY;  Surgeon: Jamesetta So, MD;  [facility];  Service: Gastroenterology;  Laterality: N/A;  . COLONOSCOPY WITH PROPOFOL N/A 09/22/2020   Procedure: COLONOSCOPY WITH PROPOFOL;  Surgeon: Rogene Houston, MD;  [facility];  Service: Endoscopy;  Laterality: N/A;  730  . HYSTEROSCOPY WITH D & C N/A 02/25/2014   Procedure: DILATATION AND CURETTAGE /HYSTEROSCOPY;  Surgeon:  Florian Buff, MD;  Location[facility];  Service: Gynecology;  Laterality: N/A;  . POLYPECTOMY N/A 02/25/2014   Procedure: POLYPECTOMY;  Surgeon: Florian Buff, MD;  Location: [facility];  Service: Gynecology;  Laterality: N/A;  . RESECTION DISTAL CLAVICAL Right 03/26/2015   Procedure: OPEN DISTAL CLAVICAL RESECTION ;  Surgeon: Netta Cedars, MD;  Location: [facility];  Service: Orthopedics;  Laterality: Right;    There were no vitals filed for this visit.    Subjective Assessment - 03/25/21 1316    Subjective Pt is here for wound and lymphedema evaluation.  She states that her legs have been swollen for about a year.  She has been having weeping in her Lt leg but that has stopped this week.  She states that she has compresion garments but she does not wear them all the time.  She does not have a pump nor is she completing any exercises on a regular basis despite being in therapy for her back.    Pertinent History Patient is being seen in  physical therapy for  c/o back pain, knee pain and hip pain. Lumbar stenosis with 2 back surgeries.    Limitations House hold activities;Lifting;Standing;Walking    How long can you walk comfortably? 10 feet  Patient Stated Goals walk and drive car and lose weight    Currently in Pain? Yes    Pain Score 6     Pain Location Leg    Pain Orientation Left;Right    Pain Descriptors / Indicators Burning;Tightness    Pain Type Chronic pain    Pain Onset More than a month ago    Pain Frequency Intermittent    Pain Relieving Factors compression garments    Multiple Pain Sites --   back and hips but will this will be addressed in a different therapy session             [facility] PT Assessment - 03/25/21 0001      Assessment   Medical Diagnosis lymphedema with LT LE weeping    Referring Provider (PT) Maren Reamer    Prior Therapy none      Precautions   Precautions Fall      Balance Screen   Has the patient fallen in the past 6 months No    Has the patient had a  decrease in activity level because of a fear of falling?  No    Is the patient reluctant to leave their home because of a fear of falling?  No      Prior Function   Level of Independence Independent with homemaking with wheelchair      Observation/Other Assessments   Observations uses WC for mobility, L AFO             LYMPHEDEMA/ONCOLOGY QUESTIONNAIRE - 03/25/21 0001      What other symptoms do you have   Are you Having Heaviness or Tightness Yes    Are you having Pain Yes    Are you having pitting edema Yes    Body Site legs    Is it Hard or Difficult finding clothes that fit Yes    Do you have infections No    Is there Decreased scar mobility No    Other Symptoms weeping of Lt LE but not at this time.      Lymphedema Stage   Stage STAGE 2 SPONTANEOUSLY IRREVERSIBLE      Lymphedema Assessments   Lymphedema Assessments Lower extremities      Right Lower Extremity Lymphedema   10 cm Proximal to Suprapatella 60 cm    At Midpatella/Popliteal Crease 54.2 cm    30 cm Proximal to Floor at Lateral Plantar Foot 41.3 cm    20 cm Proximal to Floor at Lateral Plantar Foot 37.3 1    10  cm Proximal to Floor at Lateral Malleoli 27.7 cm    Circumference of ankle/heel 35.7 cm.    5 cm Proximal to 1st MTP Joint 27.5 cm    Across MTP Joint 25.4 cm    Around Proximal Great Toe 9.7 cm      Left Lower Extremity Lymphedema   10 cm Proximal to Suprapatella 62.3 cm    At Midpatella/Popliteal Crease 51.8 cm    30 cm Proximal to Floor at Lateral Plantar Foot 44.5 cm    20 cm Proximal to Floor at Lateral Plantar Foot 35.5 cm    10 cm Proximal to Floor at Lateral Malleoli 29.2 cm    Circumference of ankle/heel 36.2 cm.    5 cm Proximal to 1st MTP Joint 28.8 cm    Across MTP Joint 26.8 cm    Around Proximal Great Toe 9.4 cm  Objective measurements completed on examination: See above findings.       [facility] Adult PT Treatment/Exercise - 03/25/21 0001       Knee/Hip Exercises: Seated   Long Arc Quad Both;5 reps    Other Seated Knee/Hip Exercises lymph squeeze and diaphragmic breathing x 5 each    Marching Both;10 reps    Abduction/Adduction  Both;5 reps                  PT Education - 03/25/21 1325    Education Details HEP, the fact that lymphedema can be controlled but not cured.  The importance of wearing compression everyday all day long.    Person(s) Educated Patient    Methods Explanation;Demonstration;Handout    Comprehension Verbalized understanding            PT Short Term Goals - 03/25/21 1338      PT SHORT TERM GOAL #1   Title Pt to have lost 1.5-2 cm in LE to reduce risk of cellulitis    Time 10    Period Days    Status New    Target Date 04/06/21      PT SHORT TERM GOAL #2   Title PT to be completing her exercises on a daily basis to improve lymphatic circulation    Time 10    Period Days    Status New      PT SHORT TERM GOAL #3   Title Pt to verbalize the fact that she will need to be donning compression garments and using pump on a daily basis.    Time 10    Period Days    Status New             PT Long Term Goals - 03/25/21 1339      PT LONG TERM GOAL #1   Title Pt to have lost 2-3 cm to have leg pain decrease to no greater than a 3/10    Time 3    Period Weeks    Status New    Target Date 04/15/21      PT LONG TERM GOAL #2   Title Pt to have obtained a Melina Copa and be able to don her compression garment with ease with the assistance of her aide.    Time 3    Period Weeks    Status New      PT LONG TERM GOAL #3   Title Pt to have obtained and be have used her compression pump    Time 3    Period Weeks    Status New                  Plan - 03/25/21 1329    Clinical Impression Statement Nicole [name] is a 75 yo female who states that she has had increasing swelling in her legs for about a year.  She started having weeping in her LT LE and that is when her MD referred her to  skilled PT for wound care and lymphedema services. She states that her Lt LE is no longer weeping. She states that she has obtained compression garments from [employer] but she does not wear them all the time as they are difficult to get on.  She also is not sure when she pucharsed them.  The therapist demonstrated the use of a Melina Copa for donning and recommended that the pt purchase one.  The therapist explained that she needs to buy compression garments every six months and  then throw all the current compression garments away.  The pt is not completing any exercises on a regular basis, the therapist reviewed lymphedema exercises with pt and stressed the importance of keeping up the exercises for lymphatic circulation.  The pt does not have a compression pump.  The treatment process of manual decongestion and compresssion bandaging was explained to the pt who is willing to participate in lymphedema treatment.  She will decide if she wants to finish her back therapy and then start lymphedema therapy or if she wants to begin lymphedema therapy right away.    Personal Factors and Comorbidities Age;Fitness;Behavior Pattern;Past/Current Experience;Comorbidity 3+;Social Background;Time since onset of injury/illness/exacerbation    Comorbidities CHF, RA, , polymyalgia, sedentary, obesity    Examination-Activity Limitations Locomotion Level;Transfers;Bathing;Bed Mobility;Stand;Stairs;Squat;Lift;Sit;Dressing;Hygiene/Grooming;Carry;Bend    Examination-Participation Restrictions Church;Meal Prep;Cleaning;Community Activity;Volunteer;Shop;Yard Work    Stability/Clinical Decision Making Unstable/Unpredictable    Clinical Decision Making Moderate    Rehab Potential Good    PT Frequency 3x / week    PT Duration 3 weeks    PT Treatment/Interventions Patient/family education;Manual lymph drainage;Compression bandaging    PT Next Visit Plan Cut foam and begin total decongestive techniques    PT Home Exercise Plan 5/17:   supine bridge, heelslides, lower trunk rotations; 5/20: sitting tall, scapular retraction 5/24 marching, LAQ, hip add iso; 6/3:  ankle pumps, LAQ, sitting marching, hip ab/adduction, diaphragmic breathing, and lymphatic squeeze.           Patient will benefit from skilled therapeutic intervention in order to improve the following deficits and impairments:  Decreased endurance,Obesity,Pain,Increased edema  Visit Diagnosis: Lymphedema, not elsewhere classified     Problem List Patient Active Problem List   Diagnosis Date Noted  . Diverticulitis 05/02/2018  . Diverticulitis of colon 05/01/2018  . Morbid obesity with BMI of 50.0-59.9, adult ([place]) 05/01/2018  . Chronic diastolic HF (heart failure) ([place]) 05/01/2018  . On prednisone therapy 05/31/2017  . Positive anti-CCP test 05/31/2017  . Rheumatoid factor positive 05/31/2017  . Memory loss 04/04/2017  . Polymyalgia rheumatica ([place]) 04/04/2017  . High risk medication use 12/07/2016  . DDD (degenerative disc disease), lumbar 09/27/2016  . HTN (hypertension) 06/05/2016  . Chronic pain syndrome 06/05/2016  . Acute diverticulitis 06/02/2016  . Diverticulitis large intestine 06/02/2016  . Spinal stenosis of lumbosacral region 02/29/2016  . Joint pain 02/29/2016  . Endometrial polyp 02/06/2014  . Postmenopausal bleeding 01/30/2014  . GERD (gastroesophageal reflux disease) 09/29/2013  . Spinal stenosis, thoracic 09/29/2013  . Shingles 09/29/2013  . Thoracic or lumbosacral neuritis or radiculitis, unspecified 05/04/2011  . Abnormality of gait 05/04/2011  . Muscle weakness (generalized) 05/04/2011  . Bilateral primary osteoarthritis of knee 04/07/2009  . KNEE PAIN 04/07/2009    Rayetta Humphrey, PT CLT [phone] 03/25/2021, 1:43 PM  [facility] [place], [place], [address] Phone: [phone]   Fax:  [phone]  Name: Nicole [name] MRN: [id number] Date of Birth:  Jun 09, 1946

## 2021-03-28 ENCOUNTER — Telehealth (HOSPITAL_COMMUNITY): Payer: Self-pay | Admitting: Physical Therapy

## 2021-03-28 NOTE — Telephone Encounter (Signed)
Sent CR a note that Nicole [name] @ Nicole [name] has requested a note from patients cardiologist to clear her to use the lymphedma pumb.

## 2021-03-29 ENCOUNTER — Encounter (HOSPITAL_COMMUNITY): Payer: Self-pay

## 2021-03-29 ENCOUNTER — Other Ambulatory Visit: Payer: Self-pay

## 2021-03-29 ENCOUNTER — Ambulatory Visit (HOSPITAL_COMMUNITY): Payer: Medicare Other

## 2021-03-29 DIAGNOSIS — M6281 Muscle weakness (generalized): Secondary | ICD-10-CM

## 2021-03-29 DIAGNOSIS — R2689 Other abnormalities of gait and mobility: Secondary | ICD-10-CM | POA: Diagnosis not present

## 2021-03-29 DIAGNOSIS — R29898 Other symptoms and signs involving the musculoskeletal system: Secondary | ICD-10-CM

## 2021-03-29 DIAGNOSIS — M25562 Pain in left knee: Secondary | ICD-10-CM

## 2021-03-29 DIAGNOSIS — M25551 Pain in right hip: Secondary | ICD-10-CM | POA: Diagnosis not present

## 2021-03-29 DIAGNOSIS — M545 Low back pain, unspecified: Secondary | ICD-10-CM

## 2021-03-29 DIAGNOSIS — M25552 Pain in left hip: Secondary | ICD-10-CM | POA: Diagnosis not present

## 2021-03-29 DIAGNOSIS — M25561 Pain in right knee: Secondary | ICD-10-CM

## 2021-03-29 NOTE — Therapy (Addendum)
[facility] [address] [place], [place], [address] Phone: [phone]   Fax:  [phone]  Physical Therapy Treatment  Patient Details  Name: Nicole [name] MRN: [id number] Date of Birth: 02-26-1946 Referring Provider (PT): Maren Reamer   Encounter Date: 03/29/2021   PT End of Session - 03/29/21 1238     Visit Number 7    Number of Visits 12    Date for PT Re-Evaluation 04/14/21    Authorization Type Primary Medicare Secondary [employer] (no auth, no VL)    Progress Note Due on Visit 10    PT Start Time 1133    PT Stop Time 1216    PT Time Calculation (min) 43 min    Equipment Utilized During Treatment Gait belt    Activity Tolerance Patient limited by fatigue;Patient limited by pain;Patient tolerated treatment well    Behavior During Therapy [facility] for tasks assessed/performed             Past Medical History:  Diagnosis Date   AC (acromioclavicular) joint bone spurs    lt shoulder   Anemia    Arthritis    Carpal tunnel syndrome, bilateral    CHF (congestive heart failure) (HCC)    Collagen vascular disease (HCC)    Gastroesophageal reflux    Headache    recent visit to ER @ [facility] for severe headache   Hypertension    Lumbar stenosis    Hx of ESIs by Dr. Nelva Bush   Polyarthralgia    Polymyalgia [facility])    Shingles    Spinal stenosis     Past Surgical History:  Procedure Laterality Date   BACK SURGERY  07/05/2018, 07/2018   x2    BIOPSY  09/22/2020   Procedure: BIOPSY;  Surgeon: Rogene Houston, MD;  [facility];  Service: Endoscopy;;   CHOLECYSTECTOMY     COLONOSCOPY  06/11/2012   Procedure: COLONOSCOPY;  Surgeon: Jamesetta So, MD;  [facility]: [facility];  Service: Gastroenterology;  Laterality: N/A;   COLONOSCOPY WITH PROPOFOL N/A 09/22/2020   Procedure: COLONOSCOPY WITH PROPOFOL;  Surgeon: Rogene Houston, MD;  [facility];  Service: Endoscopy;  Laterality: N/A;  730   HYSTEROSCOPY  WITH D & C N/A 02/25/2014   Procedure: DILATATION AND CURETTAGE /HYSTEROSCOPY;  Surgeon: Florian Buff, MD;  Location: [facility];  Service: Gynecology;  Laterality: N/A;   POLYPECTOMY N/A 02/25/2014   Procedure: POLYPECTOMY;  Surgeon: Florian Buff, MD;  Location: [facility];  Service: Gynecology;  Laterality: N/A;   RESECTION DISTAL CLAVICAL Right 03/26/2015   Procedure: OPEN DISTAL CLAVICAL RESECTION ;  Surgeon: Netta Cedars, MD;  Location: [facility];  Service: Orthopedics;  Laterality: Right;    There were no vitals filed for this visit.   Subjective Assessment - 03/29/21 1137     Subjective Pt reports her pain began to resolve this morning.  Reports she slept with LE elevated, woke up with LE swollen early this morning made it difficult to get up and out of bed.    Pertinent History Patient is being seen in  physical therapy for  c/o back pain, knee pain and hip pain. Lumbar stenosis with 2 back surgeries.    Patient Stated Goals walk and drive car and lose weight    Currently in Pain? Yes    Pain Score 6     Pain Location Leg    Pain Orientation Right;Proximal    Pain Descriptors /  Indicators Shooting    Pain Type Chronic pain    Pain Onset More than a month ago    Pain Frequency Intermittent    Aggravating Factors  transfers    Pain Relieving Factors compression garments    Effect of Pain on Daily Activities limits                               [facility] Adult PT Treatment/Exercise - 03/29/21 0001       Transfers   Transfers Sit to Stand    Sit to Stand 4: Min guard      Ambulation/Gait   Ambulation/Gait Yes    Ambulation Distance (Feet) 34 Feet   34, 28   Assistive device Rolling walker    Gait Pattern Decreased stride length;Trunk flexed;Poor foot clearance - left;Poor foot clearance - right    Ambulation Surface Level;Indoor    Gait Comments slow, labored, forward flex over RW      Knee/Hip Exercises: Standing   Other Standing Knee Exercises standing tolerance 3x  37" tops prior fatigue    Other Standing Knee Exercises Sidestep front of mat      Knee/Hip Exercises: Seated   Long Arc Quad Both;10 reps    Marching Both;10 reps    Abduction/Adduction  Both;10 reps                      PT Short Term Goals - 03/25/21 1338       PT SHORT TERM GOAL #1   Title Patient will be independent with HEP in order to improve functional outcomes   Time 4   Period weeks   Status New    Target Date 04/06/21      PT SHORT TERM GOAL #2   Title Patient will be able to ambulate x 50 ft with RW and RPE no greater than 7/10   Time 4    Period weeks   Status New      PT SHORT TERM GOAL #3   Title Patient will improve standing tolerance to 45 sec while performing dynamic upper body movements (reaching overhead to [employer] tiles) to improve effiiciency and safety with ADL performance   Time 4   Period Weeks   Status New               PT Long Term Goals - 03/25/21 1339       PT LONG TERM GOAL #1   Title Patient will improve standing tolerance to 60 sec while performing dynamic upper body movements (reaching overhead to retrive sticker tiles) to improve effiiciency and safety with ADL performance   Time 8   Period Weeks    Status New    Target Date 04/15/21      PT LONG TERM GOAL #2   Title Patient will be able to ambulate x 50 ft with RW and RPE not exceeding 5/10 to manifest improve ambulation tolerance   Time 8   Period Weeks    Status New      PT LONG TERM GOAL #3   Title    Time    Period Weeks    Status New                   Plan - 03/29/21 1239     Clinical Impression Statement Session focus on therapeutic activities to improve gait and balance.  Pt limited by fatigue  quickly required seated rest breaks throughout session.  Pt limited by pain wiht standing and reports increased gluteal pain while standing erect.  Pt required less assistance with sit to standing and able to increased distance with gait RW,  min A for safety.    Personal Factors and Comorbidities Age;Fitness;Behavior Pattern;Past/Current Experience;Comorbidity 3+;Social Background;Time since onset of injury/illness/exacerbation    Comorbidities CHF, RA, , polymyalgia, sedentary, obesity    Examination-Activity Limitations Locomotion Level;Transfers;Bathing;Bed Mobility;Stand;Stairs;Squat;Lift;Sit;Dressing;Hygiene/Grooming;Carry;Bend    Examination-Participation Restrictions Church;Meal Prep;Cleaning;Community Activity;Volunteer;Shop;Yard Work    Stability/Clinical Decision Making Unstable/Unpredictable    Clinical Decision Making Moderate    Rehab Potential Good    PT Frequency 2x / week    PT Duration 6 weeks    PT Treatment/Interventions Patient/family education;Manual lymph drainage;Compression bandaging;ADLs/Self Care Home Management;Aquatic Therapy;Biofeedback;Cryotherapy;Electrical Stimulation;Moist Heat;Traction;Ultrasound;Gait training;Stair training;Functional mobility training;Therapeutic activities;Therapeutic exercise;Balance training;Neuromuscular re-education;Manual techniques;Passive range of motion;Energy conservation;Joint Manipulations    PT Next Visit Plan Continue activity tolerance, strength and balance training.  Begin lymphedema session next week.  Cut foam and begin total decongestive techniques    PT Home Exercise Plan 5/17:  supine bridge, heelslides, lower trunk rotations; 5/20: sitting tall, scapular retraction 5/24 marching, LAQ, hip add iso; 6/3:  ankle pumps, LAQ, sitting marching, hip ab/adduction, diaphragmic breathing, and lymphatic squeeze.    Consulted and Agree with Plan of Care Patient             Patient will benefit from skilled therapeutic intervention in order to improve the following deficits and impairments:  Abnormal gait,Difficulty walking,Decreased range of motion,Decreased endurance,Increased muscle spasms,Pain,Decreased activity tolerance,Decreased skin integrity,Impaired  flexibility,Improper body mechanics,Postural dysfunction,Increased edema,Decreased mobility,Decreased strength  Visit Diagnosis: Muscle weakness (generalized)  Other abnormalities of gait and mobility  Low back pain, unspecified back pain laterality, unspecified chronicity, unspecified whether sciatica present  Other symptoms and signs involving the musculoskeletal system  Bilateral hip pain  Pain in both knees, unspecified chronicity     Problem List Patient Active Problem List   Diagnosis Date Noted   Diverticulitis 05/02/2018   Diverticulitis of colon 05/01/2018   Morbid obesity with BMI of 50.0-59.9, adult ([place]) 05/01/2018   Chronic diastolic HF (heart failure) ([place]) 05/01/2018   On prednisone therapy 05/31/2017   Positive anti-CCP test 05/31/2017   Rheumatoid factor positive 05/31/2017   Memory loss 04/04/2017   Polymyalgia rheumatica ([place]) 04/04/2017   High risk medication use 12/07/2016   DDD (degenerative disc disease), lumbar 09/27/2016   HTN (hypertension) 06/05/2016   Chronic pain syndrome 06/05/2016   Acute diverticulitis 06/02/2016   Diverticulitis large intestine 06/02/2016   Spinal stenosis of lumbosacral region 02/29/2016   Joint pain 02/29/2016   Endometrial polyp 02/06/2014   Postmenopausal bleeding 01/30/2014   GERD (gastroesophageal reflux disease) 09/29/2013   Spinal stenosis, thoracic 09/29/2013   Shingles 09/29/2013   Thoracic or lumbosacral neuritis or radiculitis, unspecified 05/04/2011   Abnormality of gait 05/04/2011   Muscle weakness (generalized) 05/04/2011   Bilateral primary osteoarthritis of knee 04/07/2009   KNEE PAIN 04/07/2009   Ihor Austin, LPTA/CLT; CBIS [phone]  Aldona Lento 03/29/2021, 1:22 PM  [facility] [place], [place], [address] Phone: [phone]   Fax:  [phone]  Name: Nicole [name] MRN: [id number] Date of Birth: 19-Nov-1945

## 2021-03-30 ENCOUNTER — Encounter (HOSPITAL_COMMUNITY): Payer: TRICARE For Life (TFL)

## 2021-03-30 ENCOUNTER — Ambulatory Visit (HOSPITAL_COMMUNITY): Admission: RE | Admit: 2021-03-30 | Payer: Non-veteran care | Source: Ambulatory Visit

## 2021-03-31 ENCOUNTER — Emergency Department (HOSPITAL_COMMUNITY)
Admission: EM | Admit: 2021-03-31 | Discharge: 2021-03-31 | Disposition: A | Discharge status: Home / Self Care | Payer: Medicare Other | Attending: Emergency Medicine | Admitting: Emergency Medicine

## 2021-03-31 ENCOUNTER — Emergency Department (HOSPITAL_COMMUNITY): Payer: Medicare Other

## 2021-03-31 ENCOUNTER — Other Ambulatory Visit: Payer: Self-pay

## 2021-03-31 ENCOUNTER — Encounter (HOSPITAL_COMMUNITY): Payer: Self-pay | Admitting: Emergency Medicine

## 2021-03-31 ENCOUNTER — Encounter (HOSPITAL_COMMUNITY): Payer: TRICARE For Life (TFL) | Admitting: Physical Therapy

## 2021-03-31 DIAGNOSIS — R52 Pain, unspecified: Secondary | ICD-10-CM

## 2021-03-31 DIAGNOSIS — M25519 Pain in unspecified shoulder: Secondary | ICD-10-CM | POA: Diagnosis not present

## 2021-03-31 DIAGNOSIS — I5032 Chronic diastolic (congestive) heart failure: Secondary | ICD-10-CM | POA: Insufficient documentation

## 2021-03-31 DIAGNOSIS — G8929 Other chronic pain: Secondary | ICD-10-CM | POA: Diagnosis not present

## 2021-03-31 DIAGNOSIS — M542 Cervicalgia: Secondary | ICD-10-CM | POA: Diagnosis not present

## 2021-03-31 DIAGNOSIS — I110 Hypertensive heart disease with heart failure: Secondary | ICD-10-CM | POA: Insufficient documentation

## 2021-03-31 DIAGNOSIS — M25512 Pain in left shoulder: Secondary | ICD-10-CM | POA: Diagnosis not present

## 2021-03-31 DIAGNOSIS — I10 Essential (primary) hypertension: Secondary | ICD-10-CM | POA: Diagnosis not present

## 2021-03-31 DIAGNOSIS — M19012 Primary osteoarthritis, left shoulder: Secondary | ICD-10-CM | POA: Diagnosis not present

## 2021-03-31 MED ORDER — CYCLOBENZAPRINE HCL 10 MG PO TABS
10.0000 mg | ORAL_TABLET | Freq: Once | ORAL | Status: AC
  Administered 2021-03-31: 10 mg via ORAL
  Filled 2021-03-31: qty 1

## 2021-03-31 MED ORDER — HYDROMORPHONE HCL 1 MG/ML IJ SOLN
0.5000 mg | Freq: Once | INTRAMUSCULAR | Status: AC
  Administered 2021-03-31: 0.5 mg via INTRAMUSCULAR
  Filled 2021-03-31: qty 1

## 2021-03-31 MED ORDER — ACETAMINOPHEN 500 MG PO TABS
1000.0000 mg | ORAL_TABLET | Freq: Once | ORAL | Status: AC
  Administered 2021-03-31: 1000 mg via ORAL
  Filled 2021-03-31: qty 2

## 2021-03-31 NOTE — ED Provider Notes (Signed)
[facility] Provider Note   CSN: [id number] Arrival date & time: 03/31/21  1300     History Chief Complaint  Patient presents with   Shoulder Pain    Nicole [name] is a 75 y.o. female.  HPI  Patient with significant medical history of AC joint spurs, anemia, arthritis, CHF, hypertension, presents to the emergency department with chief complaint of left shoulder pain.  Patient states it started yesterday, pain came on gradually, and has gotten worse.  She states it went away during the middle of the day but then came back at nighttime and has stayed consistent.  She states the pain worsened with movement, she is unable to lift up her shoulder due to pain, she has occasional paresthesias in her fingers and wrist but states this is normal for her.  She denies recent trauma to the area, denies IV drug use history, denies fevers or chills.  She endorses that she has had chronic shoulder pain in the past but it has not bothered her in a while. she also has some neck pain but does not think this is the cause of her shoulder pain she denies fusion trauma to her neck.  Patient's been taking Tylenol without significant relief.  Patient denies chest pain, shortness of breath, becoming diaphoretic, nausea, vomiting, lightheaded or dizziness.  Past Medical History:  Diagnosis Date   AC (acromioclavicular) joint bone spurs    lt shoulder   Anemia    Arthritis    Carpal tunnel syndrome, bilateral    CHF (congestive heart failure) (HCC)    Collagen vascular disease (HCC)    Gastroesophageal reflux    Headache    recent visit to ER @ [facility] for severe headache   Hypertension    Lumbar stenosis    Hx of ESIs by Dr. Nelva Bush   Polyarthralgia    Polymyalgia [facility])    Shingles    Spinal stenosis     Patient Active Problem List   Diagnosis Date Noted   Diverticulitis 05/02/2018   Diverticulitis of colon 05/01/2018   Morbid obesity with BMI of 50.0-59.9, adult ([place])  05/01/2018   Chronic diastolic HF (heart failure) ([place]) 05/01/2018   On prednisone therapy 05/31/2017   Positive anti-CCP test 05/31/2017   Rheumatoid factor positive 05/31/2017   Memory loss 04/04/2017   Polymyalgia rheumatica ([place]) 04/04/2017   High risk medication use 12/07/2016   DDD (degenerative disc disease), lumbar 09/27/2016   HTN (hypertension) 06/05/2016   Chronic pain syndrome 06/05/2016   Acute diverticulitis 06/02/2016   Diverticulitis large intestine 06/02/2016   Spinal stenosis of lumbosacral region 02/29/2016   Joint pain 02/29/2016   Endometrial polyp 02/06/2014   Postmenopausal bleeding 01/30/2014   GERD (gastroesophageal reflux disease) 09/29/2013   Spinal stenosis, thoracic 09/29/2013   Shingles 09/29/2013   Thoracic or lumbosacral neuritis or radiculitis, unspecified 05/04/2011   Abnormality of gait 05/04/2011   Muscle weakness (generalized) 05/04/2011   Bilateral primary osteoarthritis of knee 04/07/2009   KNEE PAIN 04/07/2009    Past Surgical History:  Procedure Laterality Date   BACK SURGERY  07/05/2018, 07/2018   x2    BIOPSY  09/22/2020   Procedure: BIOPSY;  Surgeon: Rogene Houston, MD;  [facility];  Service: Endoscopy;;   CHOLECYSTECTOMY     COLONOSCOPY  06/11/2012   Procedure: COLONOSCOPY;  Surgeon: Jamesetta So, MD;  Location: [facility];  Service: Gastroenterology;  Laterality: N/A;   COLONOSCOPY WITH PROPOFOL N/A 09/22/2020  Procedure: COLONOSCOPY WITH PROPOFOL;  Surgeon: Rogene Houston, MD;  Location: [facility];  Service: Endoscopy;  Laterality: N/A;  730   HYSTEROSCOPY WITH D & C N/A 02/25/2014   Procedure: DILATATION AND CURETTAGE /HYSTEROSCOPY;  Surgeon: Florian Buff, MD;  Location: [facility];  Service: Gynecology;  Laterality: N/A;   POLYPECTOMY N/A 02/25/2014   Procedure: POLYPECTOMY;  Surgeon: Florian Buff, MD;  Location: [facility];  Service: Gynecology;  Laterality: N/A;   RESECTION DISTAL CLAVICAL Right 03/26/2015    Procedure: OPEN DISTAL CLAVICAL RESECTION ;  Surgeon: Netta Cedars, MD;  Location: [facility];  Service: Orthopedics;  Laterality: Right;     OB History     Gravida  2   Para      Term      Preterm      AB      Living  2      SAB      IAB      Ectopic      Multiple      Live Births              Family History  Problem Relation Age of Onset   Hypertension Mother    Pneumonia Father    Arthritis Sister    Diabetes Paternal Grandmother     Social History   Tobacco Use   Smoking status: Never   Smokeless tobacco: Never  Vaping Use   Vaping Use: Never used  Substance Use Topics   Alcohol use: No   Drug use: No    Home Medications Prior to Admission medications   Medication Sig Start Date End Date Taking? Authorizing Provider  acetaminophen (TYLENOL) 325 MG tablet Take 2 tablets (650 mg total) by mouth every 6 (six) hours as needed for mild pain or headache (or Fever >/= 101). 05/06/18  Yes Barton Dubois, MD  atorvastatin (LIPITOR) 20 MG tablet Take 20 mg by mouth daily. 01/13/21  Yes [provider]  calcitRIOL (ROCALTROL) 0.25 MCG capsule Take 0.25 mcg by mouth See admin instructions. Take 0.25 every Mon, Wed, Fri. 09/06/20  Yes [provider]  cyclobenzaprine (FLEXERIL) 5 MG tablet Take 5 mg by mouth every 12 (twelve) hours as needed for muscle spasms.  02/20/20  Yes [provider]  diclofenac (VOLTAREN) 75 MG EC tablet Take 75 mg by mouth daily. 07/09/19  Yes [provider]  diclofenac Sodium (VOLTAREN) 1 % GEL Apply 2 g topically 4 (four) times daily as needed (pain).    Yes [provider]  gabapentin (NEURONTIN) 400 MG capsule Take 400 mg by mouth 3 (three) times daily.  06/17/19  Yes [provider]  leflunomide (ARAVA) 10 MG tablet TAKE (1) TABLET BY MOUTH DAILY. Patient taking differently: Take 10 mg by mouth daily. 12/27/20  Yes Ofilia Neas, PA-C  lidocaine (LIDODERM) 5 % Place 1-2 patches onto the  skin every 12 (twelve) hours as needed (pain).  02/19/20  Yes [provider]  Multiple Vitamins-Minerals (MULTIVITAMIN PO) Take 1 tablet by mouth daily.    Yes [provider]  pantoprazole (PROTONIX) 40 MG tablet Take 40 mg by mouth 2 (two) times daily.  08/20/13  Yes Milton Ferguson, MD  potassium chloride (KLOR-CON) 10 MEQ tablet Take 10 mEq by mouth daily. 09/06/20  Yes [provider]  predniSONE (DELTASONE) 5 MG tablet TAKE 2 TABLETS BY MOUTH ONCE DAILY WITH BREAKFAST. Patient taking differently: Take 10 mg by mouth daily with breakfast. TAKE  2 TABLETS BY MOUTH ONCE DAILY WITH BREAKFAST. 03/28/21  Yes Ofilia Neas, PA-C  Sarilumab [facility]) 200 MG/1.14ML SOAJ Inject 200 mg into the skin every 14 (fourteen) days. 02/23/21  Yes Ofilia Neas, PA-C  silver sulfADIAZINE (SILVADENE) 1 % cream Apply 1 application topically 2 (two) times daily. 03/17/21  Yes [provider]  Simethicone (GAS-X PO) Take 1 tablet by mouth 3 (three) times daily as needed (flatulence).    Yes [provider]  tapentadol (NUCYNTA) 50 MG tablet Take 50 mg by mouth daily as needed for moderate pain.   Yes [provider]  Tapentadol HCl (NUCYNTA ER) 200 MG TB12 Take 200 mg by mouth every 12 (twelve) hours.   Yes [provider]  torsemide (DEMADEX) 100 MG tablet Take 100 mg by mouth 2 (two) times daily. Take 100mg  by mouth in the morning and 50mg  in the evening.   Yes [provider]  torsemide (DEMADEX) 20 MG tablet Take 40 mg by mouth in the morning, at noon, and at bedtime. Patient not taking: No sig reported    [provider]    Allergies    Oxycontin [oxycodone hcl], Sulfa antibiotics, Tramadol, Penicillins, and Sulfasalazine  Review of Systems   Review of Systems  Constitutional:  Negative for chills and fever.  HENT:  Negative for congestion.   Respiratory:  Negative for shortness of breath.   Cardiovascular:  Negative for chest  pain.  Gastrointestinal:  Negative for abdominal pain, diarrhea, nausea and vomiting.  Genitourinary:  Negative for enuresis.  Musculoskeletal:  Negative for back pain.       Left shoulder pain and neck pain.  Skin:  Negative for rash.  Neurological:  Negative for dizziness and headaches.  Hematological:  Does not bruise/bleed easily.   Physical Exam Updated Vital Signs BP 126/76   Pulse 93   Temp 98.6 F (37 C) (Oral)   Resp 17   Ht 5' (1.524 m)   Wt 113.4 kg   SpO2 97%   BMI 48.82 kg/m   Physical Exam Vitals and nursing note reviewed.  Constitutional:      General: She is not in acute distress.    Appearance: She is not ill-appearing.  HENT:     Head: Normocephalic and atraumatic.     Nose: No congestion.  Eyes:     Extraocular Movements: Extraocular movements intact.     Conjunctiva/sclera: Conjunctivae normal.     Pupils: Pupils are equal, round, and reactive to light.  Neck:     Comments: Neck was palpated there is no tenderness along her lumbar spine but she was tender on the paraspinal muscles on the left side.  Cardiovascular:     Rate and Rhythm: Normal rate and regular rhythm.     Pulses: Normal pulses.     Heart sounds: No murmur heard.   No friction rub. No gallop.  Pulmonary:     Effort: No respiratory distress.     Breath sounds: No wheezing, rhonchi or rales.  Musculoskeletal:     Comments: Patient's left shoulder was visualized there is no gross deformities present, she has full range of motion at her fingers, wrist, elbow, she was unable to move her left shoulder very much due to pain.  She had passive range of motion up to her neck, unable to extend past this. she was tender to palpation around the anterior aspect of her deltoid, pain rating down into her bicep.  Upper extremities were neurovascular intact.  Patient had full range of motion in her lower legs bilaterally, she does have noted left foot drop, this is normal for her.  Skin:    General:  Skin is warm and dry.  Neurological:     Mental Status: She is alert.     GCS: GCS eye subscore is 4. GCS verbal subscore is 5. GCS motor subscore is 6.     Comments: Cranial nerves II through XII are grossly intact  Patient have no difficulty with word finding.  Psychiatric:        Mood and Affect: Mood normal.    ED Results / Procedures / Treatments   Labs (all labs ordered are listed, but only abnormal results are displayed) Labs Reviewed - No data to display  EKG EKG Interpretation  Date/Time:  Thursday March 31 2021 14:30:15 EDT Ventricular Rate:  90 PR Interval:  178 QRS Duration: 99 QT Interval:  398 QTC Calculation: 487 R Axis:   -50 Text Interpretation: Sinus rhythm Inferior infarct, old Lateral leads are also involved NSR, no STEMI Confirmed by Lavenia Atlas [phone]) on 03/31/2021 2:55:06 PM  Radiology DG Chest 1 View  Result Date: 03/31/2021 CLINICAL DATA:  Left shoulder pain EXAM: CHEST  1 VIEW COMPARISON:  04/16/2018 FINDINGS: Heart size is upper limits of normal. Linear scarring or atelectasis within the left mid lung. Low lung volumes. No pleural effusion or pneumothorax. Severe degenerative changes of the bilateral glenohumeral joints. IMPRESSION: 1. Linear scarring or atelectasis within the left mid lung. Low lung volumes. 2. Severe degenerative changes of the bilateral glenohumeral joints. Electronically Signed   By: Davina Poke D.O.   On: 03/31/2021 15:24   CT Cervical Spine Wo Contrast  Result Date: 03/31/2021 CLINICAL DATA:  Chronic neck pain EXAM: CT CERVICAL SPINE WITHOUT CONTRAST TECHNIQUE: Multidetector CT imaging of the cervical spine was performed without intravenous contrast. Multiplanar CT image reconstructions were also generated. COMPARISON:  Cervical spine MRI 05/30/2016 FINDINGS: Alignment: 2 mm of degenerative anterolisthesis at C4-5. Straightening of the normal cervical lordosis. Skull base and vertebrae: Spurring and loss of articular space at  the anterior C1-2 articulation. Loss of disc height at all levels between C3 and C7 and also at the T2-3 level so seated with mild endplate sclerosis most notable at C5-6 and C6-7. Multilevel degenerative facet arthropathy. No fracture or acute bony findings. Soft tissues and spinal canal: Unremarkable Disc levels: There is osseous foraminal narrowing due to uncinate and facet spurring on the right at C3-4, C4-5, C5-6, and C6-7; and on the left at C3-4, C4-5, C5-6, and C6-7. There is likely borderline to mild central narrowing of the thecal sac at C3-4 and C6-7 due to posterior intervertebral spurring Upper chest: Atherosclerotic calcification of the aortic arch. Other: No supplemental non-categorized findings. IMPRESSION: 1. Cervical spondylosis and degenerative disc disease causing impingement at all levels between C3 and C7. 2. No acute bony findings. 3.  Aortic Atherosclerosis (ICD10-I70.0). Electronically Signed   By: Van Clines M.D.   On: 03/31/2021 15:13   DG Shoulder Left  Result Date: 03/31/2021 CLINICAL DATA:  Left shoulder pain since yesterday. No trauma history submitted. EXAM: LEFT SHOULDER - 2+ VIEW COMPARISON:  Left shoulder MRI of 10/14/2011. FINDINGS: Two views of the left shoulder demonstrate advanced, marked glenohumeral joint osteoarthritis with joint space narrowing and osteophyte formation. Mild to moderate acromioclavicular joint degenerative change. High riding humeral head, suggesting chronic rotator cuff insufficiency. No acute fracture or dislocation. IMPRESSION: Marked degenerative changes, without acute osseous abnormality.  Electronically Signed   By: Abigail Miyamoto M.D.   On: 03/31/2021 15:25    Procedures Procedures   Medications Ordered in ED Medications  cyclobenzaprine (FLEXERIL) tablet 10 mg (10 mg Oral Given 03/31/21 1434)  acetaminophen (TYLENOL) tablet 1,000 mg (1,000 mg Oral Given 03/31/21 1435)  HYDROmorphone (DILAUDID) injection 0.5 mg (0.5 mg Intramuscular  Given 03/31/21 1707)    ED Course  I have reviewed the triage vital signs and the nursing notes.  Pertinent labs & imaging results that were available during my care of the patient were reviewed by me and considered in my medical decision making (see chart for details).    MDM Rules/Calculators/A&P                         Initial impression-patient presents with left shoulder pain.  She is alert, does not appear in acute distress, vital signs reassuring.  Will obtain imaging of her neck, shoulder and chest for further evaluation.  Work-up-left shoulder reveals marked degeneration changes without acute osseous abnormalities.  Chest x-ray reveals linear scarring or atelectasis within the left midlung, severe degeneration of the bilateral glenohumeral joints, CT C-spine reveals cervical spondylosis and degenerative disc disease causing impingement at all levels between C3 and C7.  EKG sinus rhythm not signs of ischemia  Reassessment-patient was reassessed, continue has no complaints this time, vital signs remained stable, patient is agreeable for discharge.  Rule out-low suspicion for ACS as patient denies chest pain, shortness of breath, presentation is atypical as she just has left shoulder pain worse with arm movement and neck movement, pain was reproducible, EKG without signs of ischemia.  Low suspicion for spinal cord abnormality or spinal fracture as there is no fractures present on imaging, patient has full range of motion in her upper extremities except for left shoulder which she can move but was limited due to pain.  Low suspicion for CVA or intracranial head bleed as patient denies recent head trauma, denies change in vision, paresthesia or weakness in the upper or lower extremities, there is no focal deficits present on my exam  Plan- Left shoulder pain suspect multifactorial suspect radiating pain from impingement from cervical spine as well as degeneration in her shoulder.  will have her  follow-up with neurosurgery for her C-spine and orthopedics for her shoulder. will provide her with muscle relaxers as she is on narcotics and steroids at this time.  Vital signs have remained stable, no indication for hospital admission.  Patient discussed with attending and they agreed with assessment and plan.  Patient given at home care as well strict return precautions.  Patient verbalized that they understood agreed to said plan.  Final Clinical Impression(s) / ED Diagnoses Final diagnoses:  Acute pain of left shoulder    Rx / DC Orders ED Discharge Orders     None        Marcello Fennel, PA-C 03/31/21 1717    Horton, Alvin Critchley, DO 04/02/21 [phone]

## 2021-03-31 NOTE — Discharge Instructions (Signed)
You have been seen here for shoulder pain.  Given you a muscle relaxer please take as prescribed, please bewear this medication make you drowsy do not consume alcohol or operate heavy machinery when taking this medication.  I want to continue taking your home dose of pain medications as well.  You may also supplement this with ibuprofen and Tylenol.  Please follow dosage and on the back of bottle.  I also recommend applying heat to the area and stretching out the muscles as this will help decrease stiffness and pain.  I have given you information on exercises please follow.  Please follow-up with neurosurgery in regards to your impingement of your neck, also recommend follow-up with orthopedic surgery for your shoulder.   Come back to the emergency department if you develop chest pain, shortness of breath, severe abdominal pain, uncontrolled nausea, vomiting, diarrhea.

## 2021-03-31 NOTE — ED Triage Notes (Signed)
Pt has left shoulder pain since yesterday.   Denies chest pain.

## 2021-04-04 ENCOUNTER — Encounter (HOSPITAL_COMMUNITY): Payer: TRICARE For Life (TFL) | Admitting: Physical Therapy

## 2021-04-04 DIAGNOSIS — M069 Rheumatoid arthritis, unspecified: Secondary | ICD-10-CM | POA: Diagnosis not present

## 2021-04-05 ENCOUNTER — Encounter (HOSPITAL_COMMUNITY): Payer: TRICARE For Life (TFL) | Admitting: Physical Therapy

## 2021-04-06 ENCOUNTER — Other Ambulatory Visit: Payer: Self-pay

## 2021-04-06 ENCOUNTER — Ambulatory Visit (HOSPITAL_COMMUNITY): Payer: Medicare Other

## 2021-04-06 ENCOUNTER — Encounter (HOSPITAL_COMMUNITY): Payer: TRICARE For Life (TFL)

## 2021-04-06 ENCOUNTER — Encounter (HOSPITAL_COMMUNITY): Payer: Self-pay

## 2021-04-06 DIAGNOSIS — M545 Low back pain, unspecified: Secondary | ICD-10-CM | POA: Diagnosis not present

## 2021-04-06 DIAGNOSIS — I890 Lymphedema, not elsewhere classified: Secondary | ICD-10-CM

## 2021-04-06 DIAGNOSIS — M25552 Pain in left hip: Secondary | ICD-10-CM | POA: Diagnosis not present

## 2021-04-06 DIAGNOSIS — M25551 Pain in right hip: Secondary | ICD-10-CM | POA: Diagnosis not present

## 2021-04-06 DIAGNOSIS — R29898 Other symptoms and signs involving the musculoskeletal system: Secondary | ICD-10-CM | POA: Diagnosis not present

## 2021-04-06 DIAGNOSIS — M6281 Muscle weakness (generalized): Secondary | ICD-10-CM | POA: Diagnosis not present

## 2021-04-06 DIAGNOSIS — R2689 Other abnormalities of gait and mobility: Secondary | ICD-10-CM | POA: Diagnosis not present

## 2021-04-06 NOTE — Therapy (Signed)
[place], [place], [address] Phone: [phone]   Fax:  [phone]  Physical Therapy Treatment  Patient Details  Name: Nicole [name] MRN: [id number] Date of Birth: Feb 25, 1946 Referring Provider (PT): Maren Reamer   Encounter Date: 04/06/2021   PT End of Session - 04/06/21 1654     Visit Number 2    Number of Visits 9    Date for PT Re-Evaluation 04/15/21    Authorization Type Primary Medicare Secondary T[employer] (no auth, no VL)    Progress Note Due on Visit 9    PT Start Time 1545   late arrival   PT Stop Time 1640    PT Time Calculation (min) 55 min    Equipment Utilized During Treatment Gait belt    Activity Tolerance Patient limited by fatigue;Patient limited by pain;Patient tolerated treatment well    Behavior During Therapy [facility] for tasks assessed/performed             Past Medical History:  Diagnosis Date   AC (acromioclavicular) joint bone spurs    lt shoulder   Anemia    Arthritis    Carpal tunnel syndrome, bilateral    CHF (congestive heart failure) (HCC)    Collagen vascular disease (HCC)    Gastroesophageal reflux    Headache    recent visit to ER @ [facility] for severe headache   Hypertension    Lumbar stenosis    Hx of ESIs by Dr. Nelva Bush   Polyarthralgia    Polymyalgia [facility])    Shingles    Spinal stenosis     Past Surgical History:  Procedure Laterality Date   BACK SURGERY  07/05/2018, 07/2018   x2    BIOPSY  09/22/2020   Procedure: BIOPSY;  Surgeon: Rogene Houston, MD;  [facility];  Service: Endoscopy;;   CHOLECYSTECTOMY     COLONOSCOPY  06/11/2012   Procedure: COLONOSCOPY;  Surgeon: Jamesetta So, MD;  [facility];  Service: Gastroenterology;  Laterality: N/A;   COLONOSCOPY WITH PROPOFOL N/A 09/22/2020   Procedure: COLONOSCOPY WITH PROPOFOL;  Surgeon: Rogene Houston, MD;  [facility];  Service: Endoscopy;  Laterality: N/A;  730    HYSTEROSCOPY WITH D & C N/A 02/25/2014   Procedure: DILATATION AND CURETTAGE /HYSTEROSCOPY;  Surgeon: Florian Buff, MD;  Location: [facility];  Service: Gynecology;  Laterality: N/A;   POLYPECTOMY N/A 02/25/2014   Procedure: POLYPECTOMY;  Surgeon: Florian Buff, MD;  Location: [facility];  Service: Gynecology;  Laterality: N/A;   RESECTION DISTAL CLAVICAL Right 03/26/2015   Procedure: OPEN DISTAL CLAVICAL RESECTION ;  Surgeon: Netta Cedars, MD;  Location: [place];  Service: Orthopedics;  Laterality: Right;    There were no vitals filed for this visit.   Subjective Assessment - 04/06/21 1650     Subjective Pt went to ER concerning increased Lt UE pain.  Reoprts she has butler but still required help from family to put compression garment on.  LBP pain scale 7/10 today.  Admits that increased arm pain has kept her from doing her exercises regularly.    Patient is accompained by: Family member    Pertinent History Patient is being seen in  physical therapy for  c/o back pain, knee pain and hip pain. Lumbar stenosis with 2 back surgeries.    Patient Stated Goals walk and drive car and lose weight    Currently in Pain? Yes  Pain Score 5     Pain Location Shoulder    Pain Orientation Left    Pain Descriptors / Indicators Aching    Pain Type Acute pain    Pain Onset More than a month ago    Pain Frequency Intermittent    Aggravating Factors  transfers    Pain Relieving Factors compression garments    Effect of Pain on Daily Activities limits    Multiple Pain Sites Yes    Pain Score 7    Pain Location Back    Pain Orientation Lower    Pain Descriptors / Indicators Aching;Dull;Sore    Pain Type Chronic pain    Pain Radiating Towards Wraps around Rt lower back to groin area    Pain Onset More than a month ago    Pain Frequency Intermittent                               [facility] Adult PT Treatment/Exercise - 04/06/21 0001       Knee/Hip Exercises: Seated   Long Arc Quad Both;2  sets;10 reps    Other Seated Knee/Hip Exercises lymph squeeze and diaphragmic breathing x 5 each    Other Seated Knee/Hip Exercises ankle pumps 20x    Marching Both;10 reps    Abduction/Adduction  Both;10 reps      Manual Therapy   Manual Therapy Compression Bandaging    Manual therapy comments Manual complete separate than rest of tx    Compression Bandaging Cut foam and application of multilayer short stretch bandages with 1/2in foam knee high                    PT Education - 04/06/21 1708     Education Details Educated 4 components with lymphedema treatment (skin care, exercise, manual, bandages), instructed self care with rolling bandages prior apt, importance of exercises regularly    Person(s) Educated Patient    Methods Explanation    Comprehension Verbalized understanding;Returned demonstration              PT Short Term Goals - 03/25/21 1338       PT SHORT TERM GOAL #1   Title Pt to have lost 1.5-2 cm in LE to reduce risk of cellulitis    Time 10    Period Days    Status New    Target Date 04/06/21      PT SHORT TERM GOAL #2   Title PT to be completing her exercises on a daily basis to improve lymphatic circulation    Time 10    Period Days    Status New      PT SHORT TERM GOAL #3   Title Pt to verbalize the fact that she will need to be donning compression garments and using pump on a daily basis.    Time 10    Period Days    Status New               PT Long Term Goals - 03/25/21 1339       PT LONG TERM GOAL #1   Title Pt to have lost 2-3 cm to have leg pain decrease to no greater than a 3/10    Time 3    Period Weeks    Status New    Target Date 04/15/21      PT LONG TERM GOAL #2   Title Pt to have obtained  a Melina Copa and be able to don her compression garment with ease with the assistance of her aide.    Time 3    Period Weeks    Status New      PT LONG TERM GOAL #3   Title Pt to have obtained and be have used her  compression pump    Time 3    Period Weeks    Status New                   Plan - 04/06/21 1654     Clinical Impression Statement Educated pt with lymphedema care including skin care, exercises, manual and bandages for compression to assist with edema.  Reviewed importance of exercises in regular routine and reviewed current exercise program.  Pt late arrival so limited time for manual.  Reviewed schedule and made some arrangements for ability to come 3 days/week, assured family member transportation is available for scheduled times.   This session cut foam and application of multilayer short stretch bandages with 1/2in foam.  Instructed to remove bandages prior tx, cleanse, moisturize and re-roll bandages prior next session.  Reports of comfort at EOS.  Wound is fully healed, required no wound care this session.    Personal Factors and Comorbidities Age;Fitness;Behavior Pattern;Past/Current Experience;Comorbidity 3+;Social Background;Time since onset of injury/illness/exacerbation    Comorbidities CHF, RA, , polymyalgia, sedentary, obesity    Examination-Activity Limitations Locomotion Level;Transfers;Bathing;Bed Mobility;Stand;Stairs;Squat;Lift;Sit;Dressing;Hygiene/Grooming;Carry;Bend    Examination-Participation Restrictions Church;Meal Prep;Cleaning;Community Activity;Volunteer;Shop;Yard Work    Stability/Clinical Decision Making Unstable/Unpredictable    Clinical Decision Making Moderate    Rehab Potential Good    PT Frequency 2x / week    PT Duration 6 weeks    PT Treatment/Interventions Patient/family education;Manual lymph drainage;Compression bandaging;Moist Heat;Traction;Ultrasound;Gait training;Stair training;Functional mobility training;Therapeutic activities;Therapeutic exercise;Balance training;Neuromuscular re-education;Manual techniques;Passive range of motion;Energy conservation;Joint Manipulations;ADLs/Self Care Home Management;Aquatic Therapy;Electrical Stimulation     PT Next Visit Plan Begin total manual decongestive techniuqes next session.    PT Home Exercise Plan 5/17:  supine bridge, heelslides, lower trunk rotations; 5/20: sitting tall, scapular retraction 5/24 marching, LAQ, hip add iso; 6/3:  ankle pumps, LAQ, sitting marching, hip ab/adduction, diaphragmic breathing, and lymphatic squeeze.    Consulted and Agree with Plan of Care Patient             Patient will benefit from skilled therapeutic intervention in order to improve the following deficits and impairments:  Decreased endurance, Obesity, Pain, Increased edema  Visit Diagnosis: Lymphedema, not elsewhere classified     Problem List Patient Active Problem List   Diagnosis Date Noted   Diverticulitis 05/02/2018   Diverticulitis of colon 05/01/2018   Morbid obesity with BMI of 50.0-59.9, adult ([place]) 05/01/2018   Chronic diastolic HF (heart failure) ([place]) 05/01/2018   On prednisone therapy 05/31/2017   Positive anti-CCP test 05/31/2017   Rheumatoid factor positive 05/31/2017   Memory loss 04/04/2017   Polymyalgia rheumatica ([place]) 04/04/2017   High risk medication use 12/07/2016   DDD (degenerative disc disease), lumbar 09/27/2016   HTN (hypertension) 06/05/2016   Chronic pain syndrome 06/05/2016   Acute diverticulitis 06/02/2016   Diverticulitis large intestine 06/02/2016   Spinal stenosis of lumbosacral region 02/29/2016   Joint pain 02/29/2016   Endometrial polyp 02/06/2014   Postmenopausal bleeding 01/30/2014   GERD (gastroesophageal reflux disease) 09/29/2013   Spinal stenosis, thoracic 09/29/2013   Shingles 09/29/2013   Thoracic or lumbosacral neuritis or radiculitis, unspecified 05/04/2011   Abnormality of gait 05/04/2011   Muscle weakness (generalized)  05/04/2011   Bilateral primary osteoarthritis of knee 04/07/2009   KNEE PAIN 04/07/2009   Ihor Austin, LPTA/CLT; CBIS [phone]  Aldona Lento 04/06/2021, 5:09 PM  [place] [address] [place], [place], [address] Phone: [phone]   Fax:  [phone]  Name: Tiearra Colwell MRN: [id number] Date of Birth: 02-05-1946

## 2021-04-07 ENCOUNTER — Encounter (HOSPITAL_COMMUNITY): Payer: TRICARE For Life (TFL)

## 2021-04-08 ENCOUNTER — Other Ambulatory Visit: Payer: Self-pay

## 2021-04-08 ENCOUNTER — Encounter (HOSPITAL_COMMUNITY): Payer: Self-pay

## 2021-04-08 ENCOUNTER — Ambulatory Visit (HOSPITAL_COMMUNITY): Payer: Medicare Other

## 2021-04-08 ENCOUNTER — Encounter (HOSPITAL_COMMUNITY): Payer: TRICARE For Life (TFL)

## 2021-04-08 DIAGNOSIS — M25551 Pain in right hip: Secondary | ICD-10-CM | POA: Diagnosis not present

## 2021-04-08 DIAGNOSIS — M545 Low back pain, unspecified: Secondary | ICD-10-CM | POA: Diagnosis not present

## 2021-04-08 DIAGNOSIS — R2689 Other abnormalities of gait and mobility: Secondary | ICD-10-CM | POA: Diagnosis not present

## 2021-04-08 DIAGNOSIS — R29898 Other symptoms and signs involving the musculoskeletal system: Secondary | ICD-10-CM | POA: Diagnosis not present

## 2021-04-08 DIAGNOSIS — M25552 Pain in left hip: Secondary | ICD-10-CM | POA: Diagnosis not present

## 2021-04-08 DIAGNOSIS — M6281 Muscle weakness (generalized): Secondary | ICD-10-CM | POA: Diagnosis not present

## 2021-04-08 DIAGNOSIS — I890 Lymphedema, not elsewhere classified: Secondary | ICD-10-CM

## 2021-04-08 NOTE — Therapy (Signed)
[place], [place], [address] Phone: [phone]   Fax:  [phone]  Physical Therapy Treatment  Patient Details  Name: Nicole [name] MRN: [id number] Date of Birth: May 19, 1946 Referring Provider (PT): Maren Reamer   Encounter Date: 04/08/2021   PT End of Session - 04/08/21 1308     Visit Number 3    Number of Visits 9    Date for PT Re-Evaluation 04/15/21    Authorization Type Primary Medicare Secondary Tricare (no auth, no VL)    Progress Note Due on Visit 9    PT Start Time 1050    PT Stop Time 1208    PT Time Calculation (min) 78 min    Equipment Utilized During Treatment Gait belt    Activity Tolerance Patient limited by fatigue;Patient limited by pain;Patient tolerated treatment well    Behavior During Therapy [facility] for tasks assessed/performed             Past Medical History:  Diagnosis Date   AC (acromioclavicular) joint bone spurs    lt shoulder   Anemia    Arthritis    Carpal tunnel syndrome, bilateral    CHF (congestive heart failure) (HCC)    Collagen vascular disease (HCC)    Gastroesophageal reflux    Headache    recent visit to ER @ [facility] for severe headache   Hypertension    Lumbar stenosis    Hx of ESIs by Dr. Nelva Bush   Polyarthralgia    Polymyalgia [facility])    Shingles    Spinal stenosis     Past Surgical History:  Procedure Laterality Date   BACK SURGERY  07/05/2018, 07/2018   x2    BIOPSY  09/22/2020   Procedure: BIOPSY;  Surgeon: Rogene Houston, MD;  [facility];  Service: Endoscopy;;   CHOLECYSTECTOMY     COLONOSCOPY  06/11/2012   Procedure: COLONOSCOPY;  Surgeon: Jamesetta So, MD;  [facility];  Service: Gastroenterology;  Laterality: N/A;   COLONOSCOPY WITH PROPOFOL N/A 09/22/2020   Procedure: COLONOSCOPY WITH PROPOFOL;  Surgeon: Rogene Houston, MD;  [facility];  Service: Endoscopy;  Laterality: N/A;  730   HYSTEROSCOPY WITH  D & C N/A 02/25/2014   Procedure: DILATATION AND CURETTAGE /HYSTEROSCOPY;  Surgeon: Florian Buff, MD;  [facility];  Service: Gynecology;  Laterality: N/A;   POLYPECTOMY N/A 02/25/2014   Procedure: POLYPECTOMY;  Surgeon: Florian Buff, MD;  Location: [facility];  Service: Gynecology;  Laterality: N/A;   RESECTION DISTAL CLAVICAL Right 03/26/2015   Procedure: OPEN DISTAL CLAVICAL RESECTION ;  Surgeon: Netta Cedars, MD;  Location: [place];  Service: Orthopedics;  Laterality: Right;    There were no vitals filed for this visit.   Subjective Assessment - 04/08/21 1253     Subjective Pt stated she has not been walking as much due to Lt UE pain.  Stated difficulty to step up and down step at home due to arm pain.  Reports family member bought her new shoes and cut the top to fit.  Stated she has been exercises some.  Family members have been asking what lymphedema is.    Pertinent History Patient is being seen in  physical therapy for  c/o back pain, knee pain and hip pain. Lumbar stenosis with 2 back surgeries.    Patient Stated Goals walk and drive car and lose weight    Currently in Pain? Yes  Pain Score 6     Pain Location Shoulder    Pain Orientation Left    Pain Descriptors / Indicators Aching    Pain Type Acute pain    Pain Onset More than a month ago    Pain Frequency Intermittent    Aggravating Factors  transfers    Pain Relieving Factors compression garments                   LYMPHEDEMA/ONCOLOGY QUESTIONNAIRE - 04/08/21 0001       Right Lower Extremity Lymphedema   10 cm Proximal to Suprapatella 60 cm   03/25/21 initial eval was: 60   At Midpatella/Popliteal Crease 54 cm   03/25/21 initial eval was: 54..2   30 cm Proximal to Floor at Lateral Plantar Foot 38.6 cm   03/25/21 initial eval was: 41.3   20 cm Proximal to Floor at Lateral Plantar Foot 30.2 1   03/25/21 initial eval was: 37.3   10 cm Proximal to Floor at Lateral Malleoli 27 cm   03/25/21 initial eval was: 27.7    Circumference of ankle/heel 33.4 cm.   03/25/21 initial eval was: 35.7   5 cm Proximal to 1st MTP Joint 27 cm   03/25/21 initial eval was: 27.5   Across MTP Joint 25 cm   03/25/21 initial eval was: 25.4   Around Proximal Great Toe 8.8 cm   03/25/21 initial eval was: 9.7     Left Lower Extremity Lymphedema   10 cm Proximal to Suprapatella 62 cm   03/25/21 initial eval was: 62.3   At Midpatella/Popliteal Crease 51 cm   03/25/21 initial eval was: 51.8   30 cm Proximal to Floor at Lateral Plantar Foot 42 cm   03/25/21 initial eval was: 44.5   20 cm Proximal to Floor at Lateral Plantar Foot 34.2 cm   03/25/21 initial eval was: 35.5   10 cm Proximal to Floor at Lateral Malleoli 28.4 cm   03/25/21 initial eval was: 29   Circumference of ankle/heel 35.4 cm.   03/25/21 initial eval was: 36.2   5 cm Proximal to 1st MTP Joint 26.5 cm   03/25/21 initial eval was: 28.8   Across MTP Joint 23 cm   03/25/21 initial eval was: 26.8   Around Proximal Great Toe 8.8 cm   03/25/21 initial eval was: 9.4                       [facility] Adult PT Treatment/Exercise - 04/08/21 0001       Ambulation/Gait   Ambulation/Gait Yes    Ambulation Distance (Feet) 20 Feet    Assistive device Rolling walker    Gait Pattern Decreased stride length;Trunk flexed;Poor foot clearance - left;Poor foot clearance - right    Ambulation Surface Level;Indoor    Gait Comments slow, labored, forward flex over RW      Manual Therapy   Manual Therapy Manual Lymphatic Drainage (MLD);Compression Bandaging    Manual therapy comments Manual complete separate than rest of tx    Manual Lymphatic Drainage (MLD) Decongestive lymphedema technqiues including neck, superficial and deep abdominal, ingunial to axillary anastomosis    Compression Bandaging Multilayer short stretch bandages with 1/2in foam knee high                    PT Education - 04/08/21 1313     Education Details Educated on what lymphedema is, handout given to read and share  with family.  Person(s) Educated Patient    Methods Explanation;Handout    Comprehension Verbalized understanding              PT Short Term Goals - 03/25/21 1338       PT SHORT TERM GOAL #1   Title Pt to have lost 1.5-2 cm in LE to reduce risk of cellulitis    Time 10    Period Days    Status New    Target Date 04/06/21      PT SHORT TERM GOAL #2   Title PT to be completing her exercises on a daily basis to improve lymphatic circulation    Time 10    Period Days    Status New      PT SHORT TERM GOAL #3   Title Pt to verbalize the fact that she will need to be donning compression garments and using pump on a daily basis.    Time 10    Period Days    Status New               PT Long Term Goals - 03/25/21 1339       PT LONG TERM GOAL #1   Title Pt to have lost 2-3 cm to have leg pain decrease to no greater than a 3/10    Time 3    Period Weeks    Status New    Target Date 04/15/21      PT LONG TERM GOAL #2   Title Pt to have obtained a Melina Copa and be able to don her compression garment with ease with the assistance of her aide.    Time 3    Period Weeks    Status New      PT LONG TERM GOAL #3   Title Pt to have obtained and be have used her compression pump    Time 3    Period Weeks    Status New                   Plan - 04/08/21 1309     Clinical Impression Statement Educated pt on lymphedema disease and discussed ways to control it, not a disease that can be fixed or gone.  Pt given handout to share with family members.  Began manual decongestive techqniues followed by application of multilayer short stretch bandages with additional 1/2in foam.  Pt stated she has not been walking as much due to Lt UE pain.  Encouraged pt to continue with exercises and to stand hourly.    Personal Factors and Comorbidities Age;Fitness;Behavior Pattern;Past/Current Experience;Comorbidity 3+;Social Background;Time since onset of injury/illness/exacerbation     Comorbidities CHF, RA, , polymyalgia, sedentary, obesity    Examination-Activity Limitations Locomotion Level;Transfers;Bathing;Bed Mobility;Stand;Stairs;Squat;Lift;Sit;Dressing;Hygiene/Grooming;Carry;Bend    Examination-Participation Restrictions Church;Meal Prep;Cleaning;Community Activity;Volunteer;Shop;Yard Work    Stability/Clinical Decision Making Unstable/Unpredictable    Clinical Decision Making Moderate    Rehab Potential Good    PT Frequency 2x / week    PT Duration 6 weeks    PT Treatment/Interventions Patient/family education;Manual lymph drainage;Compression bandaging;Moist Heat;Traction;Ultrasound;Gait training;Stair training;Functional mobility training;Therapeutic activities;Therapeutic exercise;Balance training;Neuromuscular re-education;Manual techniques;Passive range of motion;Energy conservation;Joint Manipulations;ADLs/Self Care Home Management;Aquatic Therapy;Electrical Stimulation    PT Next Visit Plan Begin total manual decongestive techniuqes next session.    PT Home Exercise Plan 5/17:  supine bridge, heelslides, lower trunk rotations; 5/20: sitting tall, scapular retraction 5/24 marching, LAQ, hip add iso; 6/3:  ankle pumps, LAQ, sitting marching, hip ab/adduction, diaphragmic breathing, and lymphatic squeeze.  Consulted and Agree with Plan of Care Patient             Patient will benefit from skilled therapeutic intervention in order to improve the following deficits and impairments:  Decreased endurance, Obesity, Pain, Increased edema  Visit Diagnosis: Lymphedema, not elsewhere classified     Problem List Patient Active Problem List   Diagnosis Date Noted   Diverticulitis 05/02/2018   Diverticulitis of colon 05/01/2018   Morbid obesity with BMI of 50.0-59.9, adult ([place]) 05/01/2018   Chronic diastolic HF (heart failure) ([place]) 05/01/2018   On prednisone therapy 05/31/2017   Positive anti-CCP test 05/31/2017   Rheumatoid factor positive 05/31/2017    Memory loss 04/04/2017   Polymyalgia rheumatica ([place]) 04/04/2017   High risk medication use 12/07/2016   DDD (degenerative disc disease), lumbar 09/27/2016   HTN (hypertension) 06/05/2016   Chronic pain syndrome 06/05/2016   Acute diverticulitis 06/02/2016   Diverticulitis large intestine 06/02/2016   Spinal stenosis of lumbosacral region 02/29/2016   Joint pain 02/29/2016   Endometrial polyp 02/06/2014   Postmenopausal bleeding 01/30/2014   GERD (gastroesophageal reflux disease) 09/29/2013   Spinal stenosis, thoracic 09/29/2013   Shingles 09/29/2013   Thoracic or lumbosacral neuritis or radiculitis, unspecified 05/04/2011   Abnormality of gait 05/04/2011   Muscle weakness (generalized) 05/04/2011   Bilateral primary osteoarthritis of knee 04/07/2009   KNEE PAIN 04/07/2009   Ihor Austin, LPTA/CLT; CBIS [phone]  Aldona Lento 04/08/2021, 1:17 PM  [place], [place], [address] Phone: [phone]   Fax:  [phone]  Name: Nicole [name] MRN: [id number] Date of Birth: Feb 26, 1946

## 2021-04-11 ENCOUNTER — Ambulatory Visit (HOSPITAL_COMMUNITY): Payer: Medicare Other | Admitting: Physical Therapy

## 2021-04-11 ENCOUNTER — Other Ambulatory Visit: Payer: Self-pay

## 2021-04-11 DIAGNOSIS — M25552 Pain in left hip: Secondary | ICD-10-CM | POA: Diagnosis not present

## 2021-04-11 DIAGNOSIS — I890 Lymphedema, not elsewhere classified: Secondary | ICD-10-CM

## 2021-04-11 DIAGNOSIS — M6281 Muscle weakness (generalized): Secondary | ICD-10-CM | POA: Diagnosis not present

## 2021-04-11 DIAGNOSIS — R2689 Other abnormalities of gait and mobility: Secondary | ICD-10-CM

## 2021-04-11 DIAGNOSIS — M545 Low back pain, unspecified: Secondary | ICD-10-CM | POA: Diagnosis not present

## 2021-04-11 DIAGNOSIS — M25551 Pain in right hip: Secondary | ICD-10-CM | POA: Diagnosis not present

## 2021-04-11 DIAGNOSIS — R29898 Other symptoms and signs involving the musculoskeletal system: Secondary | ICD-10-CM | POA: Diagnosis not present

## 2021-04-11 NOTE — Therapy (Signed)
[place], [place], [address] Phone: [phone]   Fax:  [phone]  Physical Therapy Treatment  Patient Details  Name: Nicole [name] MRN: [id number] Date of Birth: Oct 28, 1945 Referring Provider (PT): Maren Reamer   Encounter Date: 04/11/2021   PT End of Session - 04/11/21 1642     Visit Number 4    Number of Visits 9    Date for PT Re-Evaluation 04/15/21    Authorization Type Primary Medicare Secondary Tricare (no auth, no VL)    Progress Note Due on Visit 9    PT Start Time 1455    PT Stop Time 1605    PT Time Calculation (min) 70 min    Equipment Utilized During Treatment Gait belt    Activity Tolerance Patient limited by fatigue;Patient limited by pain;Patient tolerated treatment well    Behavior During Therapy [facility] for tasks assessed/performed             Past Medical History:  Diagnosis Date   AC (acromioclavicular) joint bone spurs    lt shoulder   Anemia    Arthritis    Carpal tunnel syndrome, bilateral    CHF (congestive heart failure) (HCC)    Collagen vascular disease (HCC)    Gastroesophageal reflux    Headache    recent visit to ER @ [facility] for severe headache   Hypertension    Lumbar stenosis    Hx of ESIs by Dr. Nelva Bush   Polyarthralgia    Polymyalgia [facility])    Shingles    Spinal stenosis     Past Surgical History:  Procedure Laterality Date   BACK SURGERY  07/05/2018, 07/2018   x2    BIOPSY  09/22/2020   Procedure: BIOPSY;  Surgeon: Rogene Houston, MD;  [facility];  Service: Endoscopy;;   CHOLECYSTECTOMY     COLONOSCOPY  06/11/2012   Procedure: COLONOSCOPY;  Surgeon: Jamesetta So, MD;  [facility];  Service: Gastroenterology;  Laterality: N/A;   COLONOSCOPY WITH PROPOFOL N/A 09/22/2020   Procedure: COLONOSCOPY WITH PROPOFOL;  Surgeon: Rogene Houston, MD;  [facility];  Service: Endoscopy;  Laterality: N/A;  730   HYSTEROSCOPY WITH  D & C N/A 02/25/2014   Procedure: DILATATION AND CURETTAGE /HYSTEROSCOPY;  Surgeon: Florian Buff, MD;  Location: [facility];  Service: Gynecology;  Laterality: N/A;   POLYPECTOMY N/A 02/25/2014   Procedure: POLYPECTOMY;  Surgeon: Florian Buff, MD;  Location: [facility];  Service: Gynecology;  Laterality: N/A;   RESECTION DISTAL CLAVICAL Right 03/26/2015   Procedure: OPEN DISTAL CLAVICAL RESECTION ;  Surgeon: Netta Cedars, MD;  Location: [place];  Service: Orthopedics;  Laterality: Right;    There were no vitals filed for this visit.   Subjective Assessment - 04/11/21 1652     Subjective pt states she wants to get back to walking and driving.  States her legs are sore to the touch but can tell the swelling is down.  Just removed her bandages at 1pm    Currently in Pain? No/denies                               [facility] Adult PT Treatment/Exercise - 04/11/21 0001       Ambulation/Gait   Ambulation/Gait Yes    Ambulation Distance (Feet) --   44'X1, 7' X1 with RW   Assistive device Rolling  walker    Gait Pattern Decreased stride length;Trunk flexed;Poor foot clearance - left;Poor foot clearance - right    Ambulation Surface Level;Indoor    Gait Comments slow, labored, forward flex over RW      Manual Therapy   Manual Therapy Manual Lymphatic Drainage (MLD);Compression Bandaging    Manual therapy comments Manual complete separate than rest of tx    Manual Lymphatic Drainage (MLD) Decongestive lymphedema technqiues including neck, superficial and deep abdominal, ingunial to axillary anastomosis    Compression Bandaging Multilayer short stretch bandages with 1/2in foam knee high                      PT Short Term Goals - 03/25/21 1338       PT SHORT TERM GOAL #1   Title Pt to have lost 1.5-2 cm in LE to reduce risk of cellulitis    Time 10    Period Days    Status New    Target Date 04/06/21      PT SHORT TERM GOAL #2   Title PT to be completing her exercises on  a daily basis to improve lymphatic circulation    Time 10    Period Days    Status New      PT SHORT TERM GOAL #3   Title Pt to verbalize the fact that she will need to be donning compression garments and using pump on a daily basis.    Time 10    Period Days    Status New               PT Long Term Goals - 03/25/21 1339       PT LONG TERM GOAL #1   Title Pt to have lost 2-3 cm to have leg pain decrease to no greater than a 3/10    Time 3    Period Weeks    Status New    Target Date 04/15/21      PT LONG TERM GOAL #2   Title Pt to have obtained a Melina Copa and be able to don her compression garment with ease with the assistance of her aide.    Time 3    Period Weeks    Status New      PT LONG TERM GOAL #3   Title Pt to have obtained and be have used her compression pump    Time 3    Period Weeks    Status New                   Plan - 04/11/21 1644     Clinical Impression Statement Began with ambulation and able to increase distance to 29' first trial and 95' second using RW.  Pt  required encouragement and with overall fatigue heeding further distance.  encouraged pt to walk at least 3X daily wiht assistance and to stand throughout the day to help reduce lumbar discomfort.  Answered all questions regarding compression and lymphedema.  Requested pt bring her compression garment next visit so therapist can see what mmHg she was using prior to beginning to therapy.  Pt seems to have decongested well and anticipate short need of lymph and more therapy to get patient walking/stronger.  REmeasure next session.    Personal Factors and Comorbidities Age;Fitness;Behavior Pattern;Past/Current Experience;Comorbidity 3+;Social Background;Time since onset of injury/illness/exacerbation    Comorbidities CHF, RA, , polymyalgia, sedentary, obesity    Examination-Activity Limitations Locomotion Level;Transfers;Bathing;Bed  Mobility;Stand;Stairs;Squat;Lift;Sit;Dressing;Hygiene/Grooming;Carry;[employer]  Examination-Participation Restrictions Church;Meal Prep;Cleaning;Community Activity;Volunteer;Shop;Yard Work    Stability/Clinical Decision Making Unstable/Unpredictable    Rehab Potential Good    PT Frequency 2x / week    PT Duration 6 weeks    PT Treatment/Interventions Patient/family education;Manual lymph drainage;Compression bandaging;Moist Heat;Traction;Ultrasound;Gait training;Stair training;Functional mobility training;Therapeutic activities;Therapeutic exercise;Balance training;Neuromuscular re-education;Manual techniques;Passive range of motion;Energy conservation;Joint Manipulations;ADLs/Self Care Home Management;Aquatic Therapy;Electrical Stimulation    PT Next Visit Plan continue total manual decongestive techniques.  Inspect current compression garments and advise if more is needed or what is needed for discharge.  Measure weekly (Wednesdays)F/U on how many times pt is walking daily and standing.    PT Home Exercise Plan 5/17:  supine bridge, heelslides, lower trunk rotations; 5/20: sitting tall, scapular retraction 5/24 marching, LAQ, hip add iso; 6/3:  ankle pumps, LAQ, sitting marching, hip ab/adduction, diaphragmic breathing, and lymphatic squeeze.    Consulted and Agree with Plan of Care Patient             Patient will benefit from skilled therapeutic intervention in order to improve the following deficits and impairments:  Decreased endurance, Obesity, Pain, Increased edema  Visit Diagnosis: Lymphedema, not elsewhere classified  Muscle weakness (generalized)  Other abnormalities of gait and mobility     Problem List Patient Active Problem List   Diagnosis Date Noted   Diverticulitis 05/02/2018   Diverticulitis of colon 05/01/2018   Morbid obesity with BMI of 50.0-59.9, adult ([place]) 05/01/2018   Chronic diastolic HF (heart failure) ([place]) 05/01/2018   On prednisone therapy 05/31/2017    Positive anti-CCP test 05/31/2017   Rheumatoid factor positive 05/31/2017   Memory loss 04/04/2017   Polymyalgia rheumatica ([place]) 04/04/2017   High risk medication use 12/07/2016   DDD (degenerative disc disease), lumbar 09/27/2016   HTN (hypertension) 06/05/2016   Chronic pain syndrome 06/05/2016   Acute diverticulitis 06/02/2016   Diverticulitis large intestine 06/02/2016   Spinal stenosis of lumbosacral region 02/29/2016   Joint pain 02/29/2016   Endometrial polyp 02/06/2014   Postmenopausal bleeding 01/30/2014   GERD (gastroesophageal reflux disease) 09/29/2013   Spinal stenosis, thoracic 09/29/2013   Shingles 09/29/2013   Thoracic or lumbosacral neuritis or radiculitis, unspecified 05/04/2011   Abnormality of gait 05/04/2011   Muscle weakness (generalized) 05/04/2011   Bilateral primary osteoarthritis of knee 04/07/2009   KNEE PAIN 04/07/2009   Teena Irani, PTA/CLT [phone]  Teena Irani 04/11/2021, 4:53 PM  [place], [place], [address] Phone: [phone]   Fax:  [phone]  Name: Nicole [name] MRN: [id number] Date of Birth: 1946-09-17

## 2021-04-12 ENCOUNTER — Encounter (HOSPITAL_COMMUNITY): Payer: TRICARE For Life (TFL) | Admitting: Physical Therapy

## 2021-04-12 ENCOUNTER — Telehealth: Payer: Self-pay

## 2021-04-12 NOTE — Telephone Encounter (Signed)
Patient called requesting a prescription refill of Kevzara to be sent to [facility].  Patient states she took her last injection today, 04/12/21.  Patient has questions on the co-pay process and doesn't want her prescription to be held up.  Patient requested a return call.

## 2021-04-12 NOTE — Telephone Encounter (Signed)
90 day supply sent to the pharmacy on 02/23/2021.   Attempted to contact the patient and no answer, unable to leave a message.

## 2021-04-12 NOTE — Telephone Encounter (Signed)
Patient advised we sent a 90 day supply on 02/23/2021. Patient will contact [facility] and set up shipment and pay the [facility]ay [facility].

## 2021-04-13 ENCOUNTER — Ambulatory Visit (HOSPITAL_COMMUNITY): Payer: Medicare Other | Admitting: Physical Therapy

## 2021-04-13 ENCOUNTER — Other Ambulatory Visit: Payer: Self-pay

## 2021-04-13 ENCOUNTER — Encounter (HOSPITAL_COMMUNITY): Payer: TRICARE For Life (TFL) | Admitting: Physical Therapy

## 2021-04-13 DIAGNOSIS — M545 Low back pain, unspecified: Secondary | ICD-10-CM | POA: Diagnosis not present

## 2021-04-13 DIAGNOSIS — M25551 Pain in right hip: Secondary | ICD-10-CM | POA: Diagnosis not present

## 2021-04-13 DIAGNOSIS — M6281 Muscle weakness (generalized): Secondary | ICD-10-CM | POA: Diagnosis not present

## 2021-04-13 DIAGNOSIS — R2689 Other abnormalities of gait and mobility: Secondary | ICD-10-CM | POA: Diagnosis not present

## 2021-04-13 DIAGNOSIS — M25552 Pain in left hip: Secondary | ICD-10-CM | POA: Diagnosis not present

## 2021-04-13 DIAGNOSIS — R29898 Other symptoms and signs involving the musculoskeletal system: Secondary | ICD-10-CM | POA: Diagnosis not present

## 2021-04-13 DIAGNOSIS — I890 Lymphedema, not elsewhere classified: Secondary | ICD-10-CM

## 2021-04-13 NOTE — Therapy (Signed)
[facility] [address] [place], [place], [address] Phone: [phone]   Fax:  [phone]  Physical Therapy Treatment  Patient Details  Name: Nicole [name] MRN: [id number] Date of Birth: [birth year] Referring Provider (PT): Maren Reamer   Encounter Date: 04/13/2021   PT End of Session - 04/13/21 1558     Visit Number 5    Number of Visits 9    Date for PT Re-Evaluation 04/15/21    Authorization Type Primary Medicare Secondary Tricare (no auth, no VL)    Progress Note Due on Visit 9    PT Start Time 1530    PT Stop Time 1700    PT Time Calculation (min) 90 min    Equipment Utilized During Treatment Gait belt    Activity Tolerance Patient limited by fatigue;Patient limited by pain;Patient tolerated treatment well    Behavior During Therapy [facility] for tasks assessed/performed             Past Medical History:  Diagnosis Date   AC (acromioclavicular) joint bone spurs    lt shoulder   Anemia    Arthritis    Carpal tunnel syndrome, bilateral    CHF (congestive heart failure) (HCC)    Collagen vascular disease (HCC)    Gastroesophageal reflux    Headache    recent visit to ER @ [facility] for severe headache   Hypertension    Lumbar stenosis    Hx of ESIs by Dr. Nelva Bush   Polyarthralgia    Polymyalgia [facility])    Shingles    Spinal stenosis     Past Surgical History:  Procedure Laterality Date   BACK SURGERY  07/05/2018, 07/2018   x2    BIOPSY  09/22/2020   Procedure: BIOPSY;  Surgeon: Rogene Houston, MD;  [facility];  Service: Endoscopy;;   CHOLECYSTECTOMY     COLONOSCOPY  06/11/2012   Procedure: COLONOSCOPY;  Surgeon: Jamesetta So, MD;  [facility];  Service: Gastroenterology;  Laterality: N/A;   COLONOSCOPY WITH PROPOFOL N/A 09/22/2020   Procedure: COLONOSCOPY WITH PROPOFOL;  Surgeon: Rogene Houston, MD;  [facility];  Service: Endoscopy;  Laterality: N/A;  730   HYSTEROSCOPY WITH  D & C N/A 02/25/2014   Procedure: DILATATION AND CURETTAGE /HYSTEROSCOPY;  Surgeon: Florian Buff, MD;  Location[facility];  Service: Gynecology;  Laterality: N/A;   POLYPECTOMY N/A 02/25/2014   Procedure: POLYPECTOMY;  Surgeon: Florian Buff, MD;  Location: [facility];  Service: Gynecology;  Laterality: N/A;   RESECTION DISTAL CLAVICAL Right 03/26/2015   Procedure: OPEN DISTAL CLAVICAL RESECTION ;  Surgeon: Netta Cedars, MD;  Location: [place];  Service: Orthopedics;  Laterality: Right;    There were no vitals filed for this visit.   Subjective Assessment - 04/13/21 1526     Subjective Pt continues to have increased pain in her shoulder.  Pt states she has been walking at home and doing her exercises.  States that Erlene Quan is coming to deliver his pump tomorrow and she will have an echo tomorrow.    Pertinent History Patient is being seen in  physical therapy for  c/o back pain, knee pain and hip pain. Lumbar stenosis with 2 back surgeries.    Limitations House hold activities;Lifting;Standing;Walking    How long can you walk comfortably? 10 feet    Pain Score 7     Pain Location Shoulder    Pain Orientation Left  Pain Descriptors / Indicators Aching    Pain Type Acute pain    Pain Onset More than a month ago    Pain Frequency Constant    Aggravating Factors  using her arm    Pain Relieving Factors compression garments                   LYMPHEDEMA/ONCOLOGY QUESTIONNAIRE - 04/13/21 0001       Right Lower Extremity Lymphedema   10 cm Proximal to Suprapatella 61 cm    At Midpatella/Popliteal Crease 46 cm    30 cm Proximal to Floor at Lateral Plantar Foot 40 cm    20 cm Proximal to Floor at Lateral Plantar Foot 28 1    10  cm Proximal to Floor at Lateral Malleoli 23 cm    Circumference of ankle/heel 33.3 cm.    5 cm Proximal to 1st MTP Joint 24.3 cm    Across MTP Joint 23.7 cm    Around Proximal Great Toe 9.5 cm      Left Lower Extremity Lymphedema   10 cm Proximal to Suprapatella  63 cm    At Midpatella/Popliteal Crease 49.8 cm    30 cm Proximal to Floor at Lateral Plantar Foot 39.5 cm    20 cm Proximal to Floor at Lateral Plantar Foot 30.2 cm    10 cm Proximal to Floor at Lateral Malleoli 26.7 cm    Circumference of ankle/heel 33.8 cm.    5 cm Proximal to 1st MTP Joint 25.6 cm    Across MTP Joint 23.7 cm    Around Proximal Great Toe 9 cm                        [facility] Adult PT Treatment/Exercise - 04/13/21 0001       Ambulation/Gait   Ambulation/Gait Yes    Ambulation Distance (Feet) 26 Feet   x2     Knee/Hip Exercises: Seated   Other Seated Knee/Hip Exercises sti tall and abdominal set      Manual Therapy   Manual Therapy Manual Lymphatic Drainage (MLD);Compression Bandaging    Manual therapy comments Manual complete separate than rest of tx    Manual Lymphatic Drainage (MLD) Decongestive lymphedema technqiues including neck, superficial and deep abdominal, ingunial to axillary anastomosis    Compression Bandaging Multilayer short stretch bandages with 1/2in foam knee high                      PT Short Term Goals - 04/13/21 1702       PT SHORT TERM GOAL #1   Title Pt to have lost 1.5-2 cm in LE to reduce risk of cellulitis    Time 10    Period Days    Status Achieved    Target Date 04/06/21      PT SHORT TERM GOAL #2   Title PT to be completing her exercises on a daily basis to improve lymphatic circulation    Time 10    Period Days    Status Achieved      PT SHORT TERM GOAL #3   Title Pt to verbalize the fact that she will need to be donning compression garments and using pump on a daily basis.    Time 10    Period Days    Status Achieved               PT Long Term Goals - 04/13/21 9371  PT LONG TERM GOAL #1   Title Pt to have lost 2-3 cm to have leg pain decrease to no greater than a 3/10    Time 3    Period Weeks    Status Achieved      PT LONG TERM GOAL #2   Title Pt to have obtained a  Melina Copa and be able to don her compression garment with ease with the assistance of her aide.    Time 3    Period Weeks    Status On-going      PT LONG TERM GOAL #3   Title Pt to have obtained and be have used her compression pump    Time 3    Period Weeks    Status On-going                   Plan - 04/13/21 1659     Clinical Impression Statement Pt measured with noted reduction in volume.  It is unlikely that there will be anymore loss of volume.  PT has compression socks Therapist measured and gave pt order form to obtain compression garment.  Therapist continued to encourage pt to walk at home with her aide as she has an aide everyday from 9-3.  PT instructed in ab set and sitting up tall.  Pt will be getting her compression pump tomorrow.  Pt will be discharged once she has her compression garments.    Personal Factors and Comorbidities Age;Fitness;Behavior Pattern;Past/Current Experience;Comorbidity 3+;Social Background;Time since onset of injury/illness/exacerbation    Comorbidities CHF, RA, , polymyalgia, sedentary, obesity    Examination-Activity Limitations Locomotion Level;Transfers;Bathing;Bed Mobility;Stand;Stairs;Squat;Lift;Sit;Dressing;Hygiene/Grooming;Carry;Bend    Examination-Participation Restrictions Church;Meal Prep;Cleaning;Community Activity;Volunteer;Shop;Yard Work    Stability/Clinical Decision Making Unstable/Unpredictable    Rehab Potential Good    PT Frequency 2x / week    PT Duration 6 weeks    PT Treatment/Interventions Patient/family education;Manual lymph drainage;Compression bandaging;Moist Heat;Traction;Ultrasound;Gait training;Stair training;Functional mobility training;Therapeutic activities;Therapeutic exercise;Balance training;Neuromuscular re-education;Manual techniques;Passive range of motion;Energy conservation;Joint Manipulations;ADLs/Self Care Home Management;Aquatic Therapy;Electrical Stimulation    PT Next Visit Plan continue total manual  decongestive techniques.  Inspect current compression garments and advise if more is needed or what is needed for discharge.  Measure weekly (Wednesdays)F/U on how many times pt is walking daily and standing.    PT Home Exercise Plan 5/17:  supine bridge, heelslides, lower trunk rotations; 5/20: sitting tall, scapular retraction 5/24 marching, LAQ, hip add iso; 6/3:  ankle pumps, LAQ, sitting marching, hip ab/adduction, diaphragmic breathing, and lymphatic squeeze.; 6/22:  ab set, sitting tall.    Consulted and Agree with Plan of Care Patient             Patient will benefit from skilled therapeutic intervention in order to improve the following deficits and impairments:  Decreased endurance, Obesity, Pain, Increased edema  Visit Diagnosis: Lymphedema, not elsewhere classified     Problem List Patient Active Problem List   Diagnosis Date Noted   Diverticulitis 05/02/2018   Diverticulitis of colon 05/01/2018   Morbid obesity with BMI of 50.0-59.9, adult ([place]) 05/01/2018   Chronic diastolic HF (heart failure) ([place]) 05/01/2018   On prednisone therapy 05/31/2017   Positive anti-CCP test 05/31/2017   Rheumatoid factor positive 05/31/2017   Memory loss 04/04/2017   Polymyalgia rheumatica ([place]) 04/04/2017   High risk medication use 12/07/2016   DDD (degenerative disc disease), lumbar 09/27/2016   HTN (hypertension) 06/05/2016   Chronic pain syndrome 06/05/2016   Acute diverticulitis 06/02/2016   Diverticulitis large intestine 06/02/2016  Spinal stenosis of lumbosacral region 02/29/2016   Joint pain 02/29/2016   Endometrial polyp 02/06/2014   Postmenopausal bleeding 01/30/2014   GERD (gastroesophageal reflux disease) 09/29/2013   Spinal stenosis, thoracic 09/29/2013   Shingles 09/29/2013   Thoracic or lumbosacral neuritis or radiculitis, unspecified 05/04/2011   Abnormality of gait 05/04/2011   Muscle weakness (generalized) 05/04/2011   Bilateral primary osteoarthritis of knee  04/07/2009   KNEE PAIN 04/07/2009   Rayetta Humphrey, PT CLT [phone]  04/13/2021, 5:04 PM  [facility] [place], [place], [address] Phone: [phone]   Fax:  [phone]  Name: Nicole [name] MRN: [id number] Date of Birth: [birth year]

## 2021-04-14 ENCOUNTER — Ambulatory Visit (HOSPITAL_COMMUNITY)
Admission: RE | Admit: 2021-04-14 | Discharge: 2021-04-14 | Disposition: A | Discharge status: Home / Self Care | Payer: Medicare Other | Source: Ambulatory Visit | Attending: Family Medicine | Admitting: Family Medicine

## 2021-04-14 ENCOUNTER — Encounter (HOSPITAL_COMMUNITY): Payer: TRICARE For Life (TFL) | Admitting: Physical Therapy

## 2021-04-14 DIAGNOSIS — R0602 Shortness of breath: Secondary | ICD-10-CM | POA: Diagnosis not present

## 2021-04-14 LAB — ECHOCARDIOGRAM COMPLETE
AR max vel: 1.29 cm2
AV Area VTI: 1.53 cm2
AV Area mean vel: 1.54 cm2
AV Mean grad: 6 mmHg
AV Peak grad: 14.9 mmHg
Ao pk vel: 1.93 m/s
Area-P 1/2: 2.56 cm2
S' Lateral: 2 cm

## 2021-04-14 NOTE — Progress Notes (Signed)
*  PRELIMINARY RESULTS* Echocardiogram 2D Echocardiogram has been performed.  Nicole [name] 04/14/2021, 3:19 PM

## 2021-04-15 ENCOUNTER — Telehealth: Payer: Self-pay

## 2021-04-15 ENCOUNTER — Encounter (HOSPITAL_COMMUNITY): Payer: Self-pay | Admitting: Physical Therapy

## 2021-04-15 ENCOUNTER — Encounter (HOSPITAL_COMMUNITY): Payer: TRICARE For Life (TFL) | Admitting: Physical Therapy

## 2021-04-15 ENCOUNTER — Ambulatory Visit (HOSPITAL_COMMUNITY): Payer: Medicare Other | Admitting: Physical Therapy

## 2021-04-15 ENCOUNTER — Other Ambulatory Visit: Payer: Self-pay

## 2021-04-15 DIAGNOSIS — M545 Low back pain, unspecified: Secondary | ICD-10-CM | POA: Diagnosis not present

## 2021-04-15 DIAGNOSIS — M6281 Muscle weakness (generalized): Secondary | ICD-10-CM

## 2021-04-15 DIAGNOSIS — I890 Lymphedema, not elsewhere classified: Secondary | ICD-10-CM

## 2021-04-15 DIAGNOSIS — E870 Hyperosmolality and hypernatremia: Secondary | ICD-10-CM | POA: Diagnosis not present

## 2021-04-15 DIAGNOSIS — N1832 Chronic kidney disease, stage 3b: Secondary | ICD-10-CM | POA: Diagnosis not present

## 2021-04-15 DIAGNOSIS — M25551 Pain in right hip: Secondary | ICD-10-CM | POA: Diagnosis not present

## 2021-04-15 DIAGNOSIS — R2689 Other abnormalities of gait and mobility: Secondary | ICD-10-CM | POA: Diagnosis not present

## 2021-04-15 DIAGNOSIS — I5033 Acute on chronic diastolic (congestive) heart failure: Secondary | ICD-10-CM | POA: Diagnosis not present

## 2021-04-15 DIAGNOSIS — I129 Hypertensive chronic kidney disease with stage 1 through stage 4 chronic kidney disease, or unspecified chronic kidney disease: Secondary | ICD-10-CM | POA: Diagnosis not present

## 2021-04-15 DIAGNOSIS — M25552 Pain in left hip: Secondary | ICD-10-CM | POA: Diagnosis not present

## 2021-04-15 DIAGNOSIS — R29898 Other symptoms and signs involving the musculoskeletal system: Secondary | ICD-10-CM | POA: Diagnosis not present

## 2021-04-15 NOTE — Telephone Encounter (Signed)
Pt notified and voiced understanding. Pt had no questions or concerns at this time. 

## 2021-04-15 NOTE — Therapy (Signed)
[place], [place], [address] Phone: [phone]   Fax:  [phone]  Physical Therapy Treatment  Patient Details  Name: Nicole [name] MRN: [id number] Date of Birth: 18-Dec-1945 Referring Provider (PT): [place] SUMMARY  Visits from Start of Care: 6  Current functional level related to goals / functional outcomes: Decreased swelling   Remaining deficits: Strength, gt and pain will continue ortho treatment for this    Education / Equipment: Don garments every morning; pump daily    Patient agrees to discharge. Patient goals were met. Patient is being discharged due to meeting the stated rehab goals. 6 Encounter Date: 04/15/2021   PT End of Session - 04/15/21 1619     Visit Number 6    Number of Visits 6    Date for PT Re-Evaluation 04/15/21    Authorization Type Primary Medicare Secondary T[employer] (no auth, no VL)    Progress Note Due on Visit 6    PT Start Time 1530    PT Stop Time 1638    PT Time Calculation (min) 68 min    Equipment Utilized During Treatment Gait belt    Activity Tolerance Patient limited by fatigue;Patient limited by pain;Patient tolerated treatment well    Behavior During Therapy [facility] for tasks assessed/performed             Past Medical History:  Diagnosis Date   AC (acromioclavicular) joint bone spurs    lt shoulder   Anemia    Arthritis    Carpal tunnel syndrome, bilateral    CHF (congestive heart failure) (HCC)    Collagen vascular disease (HCC)    Gastroesophageal reflux    Headache    recent visit to ER @ [facility] for severe headache   Hypertension    Lumbar stenosis    Hx of ESIs by Dr. Nelva Bush   Polyarthralgia    Polymyalgia [facility])    Shingles    Spinal stenosis     Past Surgical History:  Procedure Laterality Date   BACK SURGERY  07/05/2018, 07/2018   x2    BIOPSY  09/22/2020   Procedure: BIOPSY;  Surgeon: Rogene Houston,  MD;  [facility];  Service: Endoscopy;;   CHOLECYSTECTOMY     COLONOSCOPY  06/11/2012   Procedure: COLONOSCOPY;  Surgeon: Jamesetta So, MD;  Location[facility];  Service: Gastroenterology;  Laterality: N/A;   COLONOSCOPY WITH PROPOFOL N/A 09/22/2020   Procedure: COLONOSCOPY WITH PROPOFOL;  Surgeon: Rogene Houston, MD;  [facility];  Service: Endoscopy;  Laterality: N/A;  730   HYSTEROSCOPY WITH D & C N/A 02/25/2014   Procedure: DILATATION AND CURETTAGE /HYSTEROSCOPY;  Surgeon: Florian Buff, MD;  Location: [facility] OR[facility];  Service: Gynecology;  Laterality: N/A;   POLYPECTOMY N/A 02/25/2014   Procedure: POLYPECTOMY;  Surgeon: Florian Buff, MD;  Location: [facility];  Service: Gynecology;  Laterality: N/A;   RESECTION DISTAL CLAVICAL Right 03/26/2015   Procedure: OPEN DISTAL CLAVICAL RESECTION ;  Surgeon: Netta Cedars, MD;  Location: [facility];  Service: Orthopedics;  Laterality: Right;    There were no vitals filed for this visit.   Subjective Assessment - 04/15/21 1613     Subjective Pt states that her shoulder is sore but feeling better.  Pt comes to department with compression garment.  Pt will have someone to assist her to don the garments.  Pt recieved her compression pump yesterday.  Pt feels she is ready for discharge for lymphedema but would like to continue her therapy for her mobility.    Pertinent History Patient is being seen in  physical therapy for  c/o back pain, knee pain and hip pain. Lumbar stenosis with 2 back surgeries.    Limitations House hold activities;Lifting;Standing;Walking    How long can you walk comfortably? 10 feet    Currently in Pain? Yes    Pain Score 5     Pain Location Back    Pain Orientation Lower    Pain Descriptors / Indicators Aching    Pain Type Chronic pain    Pain Onset More than a month ago    Pain Frequency Intermittent    Aggravating Factors  walking    Pain Relieving Factors not sure    Effect of Pain on Daily Activities limits                    LYMPHEDEMA/ONCOLOGY QUESTIONNAIRE - 04/15/21 0001       Right Lower Extremity Lymphedema   10 cm Proximal to Suprapatella 61 cm    At Midpatella/Popliteal Crease 46 cm    30 cm Proximal to Floor at Lateral Plantar Foot 40 cm    20 cm Proximal to Floor at Lateral Plantar Foot '28 1    10 ' cm Proximal to Floor at Lateral Malleoli 23 cm    Circumference of ankle/heel 33.3 cm.    5 cm Proximal to 1st MTP Joint 24.3 cm    Across MTP Joint 23.7 cm    Around Proximal Great Toe 9.5 cm      Left Lower Extremity Lymphedema   10 cm Proximal to Suprapatella 63 cm    At Midpatella/Popliteal Crease 49.8 cm    30 cm Proximal to Floor at Lateral Plantar Foot 39.5 cm    20 cm Proximal to Floor at Lateral Plantar Foot 30.2 cm    10 cm Proximal to Floor at Lateral Malleoli 26.7 cm    Circumference of ankle/heel 33.8 cm.    5 cm Proximal to 1st MTP Joint 25.6 cm    Across MTP Joint 23.7 cm    Around Proximal Great Toe 9 cm                        [facility] Adult PT Treatment/Exercise - 04/15/21 0001       Ambulation/Gait   Ambulation/Gait Yes    Ambulation Distance (Feet) 42 Feet   44'X1, 90' X1 with RW   Assistive device Rolling walker      Exercises   Exercises Lumbar      Lumbar Exercises: Supine   Ab Set 10 reps    Glut Set 10 reps    Bent Knee Raise 5 reps    Bridge 10 reps    Other Supine Lumbar Exercises hip isometric exercises x 10      Manual Therapy   Manual Therapy Manual Lymphatic Drainage (MLD)    Manual therapy comments Manual complete separate than rest of tx    Manual Lymphatic Drainage (MLD) Decongestive lymphedema technqiues including neck, superficial and deep abdominal, ingunial to axillary anastomosis    Compression Bandaging donning of compression garment                      PT Short Term Goals - 04/13/21 1702       PT SHORT TERM GOAL #1   Title Pt  to have lost 1.5-2 cm in LE to reduce risk of cellulitis     Time 10    Period Days    Status Achieved    Target Date 04/06/21      PT SHORT TERM GOAL #2   Title PT to be completing her exercises on a daily basis to improve lymphatic circulation    Time 10    Period Days    Status Achieved      PT SHORT TERM GOAL #3   Title Pt to verbalize the fact that she will need to be donning compression garments and using pump on a daily basis.    Time 10    Period Days    Status Achieved               PT Long Term Goals - 04/15/21 1646       PT LONG TERM GOAL #1   Title Pt to have lost 2-3 cm to have leg pain decrease to no greater than a 3/10    Time 3    Period Weeks    Status Achieved      PT LONG TERM GOAL #2   Title Pt to have obtained a Melina Copa and be able to don her compression garment with ease with the assistance of her aide.    Baseline Pt will have an aide put garments on    Time 3    Period Weeks    Status Revised      PT LONG TERM GOAL #3   Title Pt to have obtained and be have used her compression pump    Time 3    Period Weeks    Status Achieved                   Plan - 04/15/21 1632     Clinical Impression Statement Pt brought her compression garment in which fit well. She is ready to be discharged from lymphedema treatment and resume therapy for her back and gait.    Personal Factors and Comorbidities Age;Fitness;Behavior Pattern;Past/Current Experience;Comorbidity 3+;Social Background;Time since onset of injury/illness/exacerbation    Comorbidities CHF, RA, , polymyalgia, sedentary, obesity    Examination-Activity Limitations Locomotion Level;Transfers;Bathing;Bed Mobility;Stand;Stairs;Squat;Lift;Sit;Dressing;Hygiene/Grooming;Carry;Bend    Examination-Participation Restrictions Church;Meal Prep;Cleaning;Community Activity;Volunteer;Shop;Yard Work    Stability/Clinical Decision Making Unstable/Unpredictable    Rehab Potential Good    PT Frequency 2x / week    PT Duration 6 weeks    PT  Treatment/Interventions Patient/family education;Manual lymph drainage;Compression bandaging;Moist Heat;Traction;Ultrasound;Gait training;Stair training;Functional mobility training;Therapeutic activities;Therapeutic exercise;Balance training;Neuromuscular re-education;Manual techniques;Passive range of motion;Energy conservation;Joint Manipulations;ADLs/Self Care Home Management;Aquatic Therapy;Electrical Stimulation    PT Next Visit Plan Discharge from lymphedema treatment resume back therapy.  .    PT Home Exercise Plan 5/17:  supine bridge, heelslides, lower trunk rotations; 5/20: sitting tall, scapular retraction 5/24 marching, LAQ, hip add iso; 6/3:  ankle pumps, LAQ, sitting marching, hip ab/adduction, diaphragmic breathing, and lymphatic squeeze.; 6/22:  ab set, sitting tall.    Consulted and Agree with Plan of Care Patient             Patient will benefit from skilled therapeutic intervention in order to improve the following deficits and impairments:  Decreased endurance, Obesity, Pain, Increased edema  Visit Diagnosis: Lymphedema, not elsewhere classified  Muscle weakness (generalized)     Problem List Patient Active Problem List   Diagnosis Date Noted   Diverticulitis 05/02/2018   Diverticulitis of colon 05/01/2018   Morbid obesity with BMI  of 50.0-59.9, adult ([place]) 05/01/2018   Chronic diastolic HF (heart failure) ([place]) 05/01/2018   On prednisone therapy 05/31/2017   Positive anti-CCP test 05/31/2017   Rheumatoid factor positive 05/31/2017   Memory loss 04/04/2017   Polymyalgia rheumatica ([place]) 04/04/2017   High risk medication use 12/07/2016   DDD (degenerative disc disease), lumbar 09/27/2016   HTN (hypertension) 06/05/2016   Chronic pain syndrome 06/05/2016   Acute diverticulitis 06/02/2016   Diverticulitis large intestine 06/02/2016   Spinal stenosis of lumbosacral region 02/29/2016   Joint pain 02/29/2016   Endometrial polyp 02/06/2014   Postmenopausal  bleeding 01/30/2014   GERD (gastroesophageal reflux disease) 09/29/2013   Spinal stenosis, thoracic 09/29/2013   Shingles 09/29/2013   Thoracic or lumbosacral neuritis or radiculitis, unspecified 05/04/2011   Abnormality of gait 05/04/2011   Muscle weakness (generalized) 05/04/2011   Bilateral primary osteoarthritis of knee 04/07/2009   KNEE PAIN 04/07/2009   Rayetta Humphrey, PT CLT [phone]  04/15/2021, 4:47 PM  [place], [place], [address] Phone: [phone]   Fax:  [phone]  Name: Nicole [name] MRN: [id number] Date of Birth: [birth year]

## 2021-04-15 NOTE — Telephone Encounter (Signed)
-----   Message from Theora Gianotti, NP sent at 04/15/2021  8:16 AM EDT ----- Heart squeezing function is supranormal @ > 75%. In that setting, the heart muscle is mildly thickened.  No significant valvular disease.  Overall, similar to prior echo.

## 2021-04-18 ENCOUNTER — Encounter (HOSPITAL_COMMUNITY): Payer: Medicare Other | Admitting: Physical Therapy

## 2021-04-18 ENCOUNTER — Ambulatory Visit (HOSPITAL_COMMUNITY): Payer: Medicare Other | Admitting: Physical Therapy

## 2021-04-18 ENCOUNTER — Telehealth (HOSPITAL_COMMUNITY): Payer: Self-pay | Admitting: Physical Therapy

## 2021-04-18 NOTE — Telephone Encounter (Signed)
Cx due to weather - r/s for tomorrow

## 2021-04-19 ENCOUNTER — Ambulatory Visit (HOSPITAL_COMMUNITY): Payer: Medicare Other

## 2021-04-19 ENCOUNTER — Encounter (HOSPITAL_COMMUNITY): Payer: Self-pay

## 2021-04-19 ENCOUNTER — Other Ambulatory Visit: Payer: Self-pay

## 2021-04-19 DIAGNOSIS — M25561 Pain in right knee: Secondary | ICD-10-CM

## 2021-04-19 DIAGNOSIS — R2689 Other abnormalities of gait and mobility: Secondary | ICD-10-CM | POA: Diagnosis not present

## 2021-04-19 DIAGNOSIS — M25562 Pain in left knee: Secondary | ICD-10-CM

## 2021-04-19 DIAGNOSIS — M25552 Pain in left hip: Secondary | ICD-10-CM

## 2021-04-19 DIAGNOSIS — M6281 Muscle weakness (generalized): Secondary | ICD-10-CM

## 2021-04-19 DIAGNOSIS — M25551 Pain in right hip: Secondary | ICD-10-CM

## 2021-04-19 DIAGNOSIS — R29898 Other symptoms and signs involving the musculoskeletal system: Secondary | ICD-10-CM | POA: Diagnosis not present

## 2021-04-19 DIAGNOSIS — M545 Low back pain, unspecified: Secondary | ICD-10-CM | POA: Diagnosis not present

## 2021-04-19 NOTE — Therapy (Signed)
[place], [place], [address] Phone: [phone]   Fax:  [phone]  Physical Therapy Treatment, Progress Note, and Recertification  Patient Details  Name: Nicole [name] MRN: [id number] Date of Birth: 05/02/1946 Referring Provider (PT): Ofilia Neas, PA-C  Progress Note Reporting Period 03/03/21 to 04/19/21  See note below for Objective Data and Assessment of Progress/Goals.     Encounter Date: 04/19/2021   PT End of Session - 04/19/21 1042     Visit Number 5    Number of Visits 18    Date for PT Re-Evaluation 06/14/21    Authorization Type Primary Medicare Secondary Tricare (no auth, no VL)    Progress Note Due on Visit 16    PT Start Time 1000    PT Stop Time 1039    PT Time Calculation (min) 39 min    Equipment Utilized During Treatment Gait belt    Activity Tolerance Patient limited by fatigue;Patient limited by pain;Patient tolerated treatment well    Behavior During Therapy [facility] for tasks assessed/performed             Past Medical History:  Diagnosis Date   AC (acromioclavicular) joint bone spurs    lt shoulder   Anemia    Arthritis    Carpal tunnel syndrome, bilateral    CHF (congestive heart failure) (HCC)    Collagen vascular disease (HCC)    Gastroesophageal reflux    Headache    recent visit to ER @ [facility] for severe headache   Hypertension    Lumbar stenosis    Hx of ESIs by Dr. Nelva Bush   Polyarthralgia    Polymyalgia [facility])    Shingles    Spinal stenosis     Past Surgical History:  Procedure Laterality Date   BACK SURGERY  07/05/2018, 07/2018   x2    BIOPSY  09/22/2020   Procedure: BIOPSY;  Surgeon: Rogene Houston, MD;  [facility];  Service: Endoscopy;;   CHOLECYSTECTOMY     COLONOSCOPY  06/11/2012   Procedure: COLONOSCOPY;  Surgeon: Jamesetta So, MD;  [facility];  Service: Gastroenterology;  Laterality: N/A;   COLONOSCOPY WITH PROPOFOL N/A  09/22/2020   Procedure: COLONOSCOPY WITH PROPOFOL;  Surgeon: Rogene Houston, MD;  [facility];  Service: Endoscopy;  Laterality: N/A;  730   HYSTEROSCOPY WITH D & C N/A 02/25/2014   Procedure: DILATATION AND CURETTAGE /HYSTEROSCOPY;  Surgeon: Florian Buff, MD;  Location: [facility];  Service: Gynecology;  Laterality: N/A;   POLYPECTOMY N/A 02/25/2014   Procedure: POLYPECTOMY;  Surgeon: Florian Buff, MD;  Location: [facility];  Service: Gynecology;  Laterality: N/A;   RESECTION DISTAL CLAVICAL Right 03/26/2015   Procedure: OPEN DISTAL CLAVICAL RESECTION ;  Surgeon: Netta Cedars, MD;  Location: [facility];  Service: Orthopedics;  Laterality: Right;    There were no vitals filed for this visit.   Subjective Assessment - 04/19/21 1040     Subjective Pt notes left ankle swelling has progressed over the past few days.    Pertinent History Patient is being seen in  physical therapy for  c/o back pain, knee pain and hip pain. Lumbar stenosis with 2 back surgeries.    Limitations House hold activities;Lifting;Standing;Walking    How long can you walk comfortably? 10 feet    Currently in Pain? Yes    Pain Score 5     Pain Location Hip  Pain Orientation Right    Pain Descriptors / Indicators Aching;Sharp    Pain Type Chronic pain    Pain Onset More than a month ago    Pain Score 7    Pain Location Back    Pain Orientation Lower    Pain Descriptors / Indicators Aching;Dull;Sore    Pain Type Chronic pain    Pain Radiating Towards radiates around right buttocks to groin    Pain Onset More than a month ago    Pain Frequency Intermittent                [facility] PT Assessment - 04/19/21 0001       Assessment   Medical Diagnosis knee OA, DDD    Referring Provider (PT) Ofilia Neas, PA-C      Precautions   Required Braces or Orthoses Other Brace/Splint   AFO     Strength   Right Hip Flexion 3-/5    Right Hip ABduction 3-/5    Right Hip ADduction 3/5    Left Hip Flexion 2+/5    Left  Hip ABduction 3-/5    Right Knee Flexion 3+/5    Right Knee Extension 3+/5    Left Knee Flexion 3+/5    Left Knee Extension 3/5      Transfers   Transfers Sit to Stand    Sit to Stand 4: Min guard      Ambulation/Gait   Ambulation/Gait Yes    Ambulation Distance (Feet) 48 Feet    Assistive device Rolling walker    Gait Pattern Decreased stride length;Trunk flexed;Poor foot clearance - left;Poor foot clearance - right    Gait Comments RPE 9/10 with ambulation      High Level Balance   High Level Balance Comments Standing with walker and performing reaching overhead task: 20 sec tolerance                                   PT Education - 04/19/21 1042     Education Details education on updated POC, assessment findings, RPE for ambulation    Person(s) Educated Patient;Caregiver(s)    Methods Explanation    Comprehension Verbalized understanding              PT Short Term Goals - 04/19/21 1045       PT SHORT TERM GOAL #1   Title Patient will be independent with HEP in order to improve functional outcomes.    Time 4    Period Weeks    Status New    Target Date 05/17/21      PT SHORT TERM GOAL #2   Title Patient will be able to ambulate x 50 ft with RW and RPE no greater than 7/10    Baseline 9/10 RPE with ambulation x 48 ft    Period Weeks    Status New    Target Date 05/17/21      PT SHORT TERM GOAL #3   Title Patient will improve standing tolerance to 45 sec while performing dynamic upper body movements (reaching overhead to retrive sticker tiles) to improve effiiciency and safety with ADL performance    Baseline 20 sec with unilateral support    Time 4    Period Weeks    Status New    Target Date 05/17/21               PT Long Term Goals -  04/19/21 1048       PT LONG TERM GOAL #1   Title Patient will improve standing tolerance to 60 sec while performing dynamic upper body movements (reaching overhead to retrive sticker  tiles) to improve effiiciency and safety with ADL performance    Baseline 20 sec    Time 8    Period Weeks    Status New    Target Date 06/14/21      PT LONG TERM GOAL #2   Title Patient will be able to ambulate x 50 ft with RW and RPE not exceeding 5/10 to manifest improve ambulation tolerance    Baseline 48 ft with RPE 9/10    Time 8    Period Weeks    Status New    Target Date 06/14/21                   Plan - 04/19/21 1043     Clinical Impression Statement Re-assessment on this date with continuing demonstration of BLE weakness, reduced functional activity tolerance, reduced ability to safely ambulate, balance deficits, and increased exertion with functional activities.  Continued PT sessions indicated to improve BLE strength, facilitate increased independence with mobility, and improve activity tolerance    Personal Factors and Comorbidities Age;Fitness;Behavior Pattern;Past/Current Experience;Comorbidity 3+;Social Background;Time since onset of injury/illness/exacerbation    Comorbidities CHF, RA, , polymyalgia, sedentary, obesity    Examination-Activity Limitations Locomotion Level;Transfers;Bathing;Bed Mobility;Stand;Stairs;Squat;Lift;Sit;Dressing;Hygiene/Grooming;Carry;Bend;Reach Overhead    Examination-Participation Restrictions Church;Meal Prep;Cleaning;Community Activity;Volunteer;Shop;Yard Work    Rehab Potential Good    PT Frequency 2x / week    PT Duration 8 weeks    PT Treatment/Interventions Patient/family education;Manual lymph drainage;Compression bandaging;Moist Heat;Traction;Ultrasound;Gait training;Stair training;Functional mobility training;Therapeutic activities;Therapeutic exercise;Balance training;Neuromuscular re-education;Manual techniques;Passive range of motion;Energy conservation;Joint Manipulations;ADLs/Self Care Home Management;Aquatic Therapy;Electrical Stimulation    PT Next Visit Plan trunk/LE strength    Consulted and Agree with Plan of Care  Patient             Patient will benefit from skilled therapeutic intervention in order to improve the following deficits and impairments:  Decreased endurance, Obesity, Pain, Increased edema, Abnormal gait, Decreased activity tolerance, Decreased balance, Decreased mobility, Decreased strength, Difficulty walking  Visit Diagnosis: Muscle weakness (generalized)  Other abnormalities of gait and mobility  Low back pain, unspecified back pain laterality, unspecified chronicity, unspecified whether sciatica present  Other symptoms and signs involving the musculoskeletal system  Bilateral hip pain  Pain in both knees, unspecified chronicity     Problem List Patient Active Problem List   Diagnosis Date Noted   Diverticulitis 05/02/2018   Diverticulitis of colon 05/01/2018   Morbid obesity with BMI of 50.0-59.9, adult ([place]) 05/01/2018   Chronic diastolic HF (heart failure) (HCC) 05/01/2018   On prednisone therapy 05/31/2017   Positive anti-CCP test 05/31/2017   Rheumatoid factor positive 05/31/2017   Memory loss 04/04/2017   Polymyalgia rheumatica ([place]) 04/04/2017   High risk medication use 12/07/2016   DDD (degenerative disc disease), lumbar 09/27/2016   HTN (hypertension) 06/05/2016   Chronic pain syndrome 06/05/2016   Acute diverticulitis 06/02/2016   Diverticulitis large intestine 06/02/2016   Spinal stenosis of lumbosacral region 02/29/2016   Joint pain 02/29/2016   Endometrial polyp 02/06/2014   Postmenopausal bleeding 01/30/2014   GERD (gastroesophageal reflux disease) 09/29/2013   Spinal stenosis, thoracic 09/29/2013   Shingles 09/29/2013   Thoracic or lumbosacral neuritis or radiculitis, unspecified 05/04/2011   Abnormality of gait 05/04/2011   Muscle weakness (generalized) 05/04/2011   Bilateral primary osteoarthritis of  knee 04/07/2009   KNEE PAIN 04/07/2009   10:53 AM, 04/19/21 M. Sherlyn Lees, PT, DPT Physical Therapist- [place] Office Number:  [phone]   [place] [address] [place], [place], [address] Phone: [phone]   Fax:  [phone]  Name: Nicole [name] MRN: [id number] Date of Birth: [birth year]

## 2021-04-20 ENCOUNTER — Ambulatory Visit (HOSPITAL_COMMUNITY): Payer: Medicare Other | Admitting: Physical Therapy

## 2021-04-20 ENCOUNTER — Encounter (HOSPITAL_COMMUNITY): Payer: TRICARE For Life (TFL) | Admitting: Physical Therapy

## 2021-04-20 DIAGNOSIS — M25551 Pain in right hip: Secondary | ICD-10-CM | POA: Diagnosis not present

## 2021-04-20 DIAGNOSIS — R29898 Other symptoms and signs involving the musculoskeletal system: Secondary | ICD-10-CM | POA: Diagnosis not present

## 2021-04-20 DIAGNOSIS — R2689 Other abnormalities of gait and mobility: Secondary | ICD-10-CM

## 2021-04-20 DIAGNOSIS — M545 Low back pain, unspecified: Secondary | ICD-10-CM | POA: Diagnosis not present

## 2021-04-20 DIAGNOSIS — M25552 Pain in left hip: Secondary | ICD-10-CM | POA: Diagnosis not present

## 2021-04-20 DIAGNOSIS — M6281 Muscle weakness (generalized): Secondary | ICD-10-CM

## 2021-04-20 NOTE — Therapy (Signed)
[place], [place], [address] Phone: [phone]   Fax:  [phone]  Physical Therapy Treatment  Patient Details  Name: Nicole [name] MRN: [id number] Date of Birth: 16-Oct-1946 Referring Provider (PT): Ofilia Neas, PA-C   Encounter Date: 04/20/2021   PT End of Session - 04/20/21 1045     Visit Number 6    Number of Visits 18    Date for PT Re-Evaluation 06/14/21    Authorization Type Primary Medicare Secondary T[employer] (no auth, no VL)    Progress Note Due on Visit 16    PT Start Time 0920    PT Stop Time 1005    PT Time Calculation (min) 45 min    Equipment Utilized During Treatment Gait belt    Activity Tolerance Patient limited by fatigue;Patient limited by pain;Patient tolerated treatment well    Behavior During Therapy [facility] for tasks assessed/performed             Past Medical History:  Diagnosis Date   AC (acromioclavicular) joint bone spurs    lt shoulder   Anemia    Arthritis    Carpal tunnel syndrome, bilateral    CHF (congestive heart failure) (HCC)    Collagen vascular disease (HCC)    Gastroesophageal reflux    Headache    recent visit to ER @ [facility] for severe headache   Hypertension    Lumbar stenosis    Hx of ESIs by Dr. Nelva Bush   Polyarthralgia    Polymyalgia [facility])    Shingles    Spinal stenosis     Past Surgical History:  Procedure Laterality Date   BACK SURGERY  07/05/2018, 07/2018   x2    BIOPSY  09/22/2020   Procedure: BIOPSY;  Surgeon: Rogene Houston, MD;  [facility];  Service: Endoscopy;;   CHOLECYSTECTOMY     COLONOSCOPY  06/11/2012   Procedure: COLONOSCOPY;  Surgeon: Jamesetta So, MD;  [facility];  Service: Gastroenterology;  Laterality: N/A;   COLONOSCOPY WITH PROPOFOL N/A 09/22/2020   Procedure: COLONOSCOPY WITH PROPOFOL;  Surgeon: Rogene Houston, MD;  [facility];  Service: Endoscopy;  Laterality: N/A;  730    HYSTEROSCOPY WITH D & C N/A 02/25/2014   Procedure: DILATATION AND CURETTAGE /HYSTEROSCOPY;  Surgeon: Florian Buff, MD;  Location: [facility];  Service: Gynecology;  Laterality: N/A;   POLYPECTOMY N/A 02/25/2014   Procedure: POLYPECTOMY;  Surgeon: Florian Buff, MD;  Location: [facility];  Service: Gynecology;  Laterality: N/A;   RESECTION DISTAL CLAVICAL Right 03/26/2015   Procedure: OPEN DISTAL CLAVICAL RESECTION ;  Surgeon: Netta Cedars, MD;  Location: [place];  Service: Orthopedics;  Laterality: Right;    There were no vitals filed for this visit.                      [facility] Adult PT Treatment/Exercise - 04/20/21 0001       Ambulation/Gait   Ambulation/Gait Yes    Ambulation Distance (Feet) 146 Feet   3 bouts 60', 50', 36'   Assistive device Rolling walker    Gait Pattern Decreased stride length;Trunk flexed;Poor foot clearance - left;Poor foot clearance - right    Ambulation Surface Level;Indoor    Gait Comments 3 bouts with rest between      Knee/Hip Exercises: Standing   Hip Flexion Both;2 sets;10 reps    Hip Flexion Limitations in // bars with  rest between sets    Other Standing Knee Exercises side stepping 4RT in // bars with seated rests between each bout                      PT Short Term Goals - 04/19/21 1045       PT SHORT TERM GOAL #1   Title Patient will be independent with HEP in order to improve functional outcomes.    Time 4    Period Weeks    Status New    Target Date 05/17/21      PT SHORT TERM GOAL #2   Title Patient will be able to ambulate x 50 ft with RW and RPE no greater than 7/10    Baseline 9/10 RPE with ambulation x 48 ft    Period Weeks    Status New    Target Date 05/17/21      PT SHORT TERM GOAL #3   Title Patient will improve standing tolerance to 45 sec while performing dynamic upper body movements (reaching overhead to retrive sticker tiles) to improve effiiciency and safety with ADL performance    Baseline 20 sec with  unilateral support    Time 4    Period Weeks    Status New    Target Date 05/17/21               PT Long Term Goals - 04/19/21 1048       PT LONG TERM GOAL #1   Title Patient will improve standing tolerance to 60 sec while performing dynamic upper body movements (reaching overhead to retrive sticker tiles) to improve effiiciency and safety with ADL performance    Baseline 20 sec    Time 8    Period Weeks    Status New    Target Date 06/14/21      PT LONG TERM GOAL #2   Title Patient will be able to ambulate x 50 ft with RW and RPE not exceeding 5/10 to manifest improve ambulation tolerance    Baseline 48 ft with RPE 9/10    Time 8    Period Weeks    Status New    Target Date 06/14/21                   Plan - 04/20/21 1121     Clinical Impression Statement Pt comes today without her compression stockings donned.  STates she was in a hurry and did not get them on.  Stressed importance of donning these everyday and if runs out of time next time to bring them with her.  Explained importance of wearing garments while exercising.  Pt verbalized understanding.  Worked first on ambulation with abilitiy to complete 3 bouts with small rest break between each.  60 feet, 50 feet and 36 feet completed with RW.  Alternating march with limited ROM and sidestepping completed in bars with rest break between each set.  pt mostly limited by poor endurance with cues needed for breathing and pacing self with activities.  ENcouraged to ambulate more at home.    Personal Factors and Comorbidities Age;Fitness;Behavior Pattern;Past/Current Experience;Comorbidity 3+;Social Background;Time since onset of injury/illness/exacerbation    Comorbidities CHF, RA, , polymyalgia, sedentary, obesity    Examination-Activity Limitations Locomotion Level;Transfers;Bathing;Bed Mobility;Stand;Stairs;Squat;Lift;Sit;Dressing;Hygiene/Grooming;Carry;Bend;Reach Overhead    Examination-Participation Restrictions  Church;Meal Prep;Cleaning;Community Activity;Volunteer;Shop;Yard Work    Rehab Potential Good    PT Frequency 2x / week    PT Duration 8 weeks  PT Treatment/Interventions Patient/family education;Manual lymph drainage;Compression bandaging;Moist Heat;Traction;Ultrasound;Gait training;Stair training;Functional mobility training;Therapeutic activities;Therapeutic exercise;Balance training;Neuromuscular re-education;Manual techniques;Passive range of motion;Energy conservation;Joint Manipulations;ADLs/Self Care Home Management;Aquatic Therapy;Electrical Stimulation    PT Next Visit Plan continue to progress functional strength and mobility.    Consulted and Agree with Plan of Care Patient             Patient will benefit from skilled therapeutic intervention in order to improve the following deficits and impairments:  Decreased endurance, Obesity, Pain, Increased edema, Abnormal gait, Decreased activity tolerance, Decreased balance, Decreased mobility, Decreased strength, Difficulty walking  Visit Diagnosis: Muscle weakness (generalized)  Other abnormalities of gait and mobility  Low back pain, unspecified back pain laterality, unspecified chronicity, unspecified whether sciatica present     Problem List Patient Active Problem List   Diagnosis Date Noted   Diverticulitis 05/02/2018   Diverticulitis of colon 05/01/2018   Morbid obesity with BMI of 50.0-59.9, adult ([place]) 05/01/2018   Chronic diastolic HF (heart failure) ([place]) 05/01/2018   On prednisone therapy 05/31/2017   Positive anti-CCP test 05/31/2017   Rheumatoid factor positive 05/31/2017   Memory loss 04/04/2017   Polymyalgia rheumatica ([place]) 04/04/2017   High risk medication use 12/07/2016   DDD (degenerative disc disease), lumbar 09/27/2016   HTN (hypertension) 06/05/2016   Chronic pain syndrome 06/05/2016   Acute diverticulitis 06/02/2016   Diverticulitis large intestine 06/02/2016   Spinal stenosis of lumbosacral  region 02/29/2016   Joint pain 02/29/2016   Endometrial polyp 02/06/2014   Postmenopausal bleeding 01/30/2014   GERD (gastroesophageal reflux disease) 09/29/2013   Spinal stenosis, thoracic 09/29/2013   Shingles 09/29/2013   Thoracic or lumbosacral neuritis or radiculitis, unspecified 05/04/2011   Abnormality of gait 05/04/2011   Muscle weakness (generalized) 05/04/2011   Bilateral primary osteoarthritis of knee 04/07/2009   KNEE PAIN 04/07/2009   Teena Irani, PTA/CLT [phone]  Teena Irani 04/20/2021, 11:26 AM  [facility] [address] [place], [place], [address] Phone: [phone]   Fax:  [phone]  Name: Nicole [name] MRN: [id number] Date of Birth: 1946/03/27

## 2021-04-21 ENCOUNTER — Encounter (HOSPITAL_COMMUNITY): Payer: Self-pay | Admitting: Physical Therapy

## 2021-04-21 DIAGNOSIS — E211 Secondary hyperparathyroidism, not elsewhere classified: Secondary | ICD-10-CM | POA: Diagnosis not present

## 2021-04-21 DIAGNOSIS — I5033 Acute on chronic diastolic (congestive) heart failure: Secondary | ICD-10-CM | POA: Diagnosis not present

## 2021-04-21 DIAGNOSIS — I129 Hypertensive chronic kidney disease with stage 1 through stage 4 chronic kidney disease, or unspecified chronic kidney disease: Secondary | ICD-10-CM | POA: Diagnosis not present

## 2021-04-21 DIAGNOSIS — N1832 Chronic kidney disease, stage 3b: Secondary | ICD-10-CM | POA: Diagnosis not present

## 2021-04-21 DIAGNOSIS — E559 Vitamin D deficiency, unspecified: Secondary | ICD-10-CM | POA: Diagnosis not present

## 2021-04-22 ENCOUNTER — Other Ambulatory Visit: Payer: Self-pay

## 2021-04-22 ENCOUNTER — Encounter (HOSPITAL_COMMUNITY): Payer: Self-pay

## 2021-04-22 ENCOUNTER — Ambulatory Visit (HOSPITAL_COMMUNITY): Payer: Medicare Other

## 2021-04-22 ENCOUNTER — Ambulatory Visit (HOSPITAL_COMMUNITY): Payer: Medicare Other | Attending: Physician Assistant

## 2021-04-22 DIAGNOSIS — M6281 Muscle weakness (generalized): Secondary | ICD-10-CM | POA: Diagnosis not present

## 2021-04-22 DIAGNOSIS — M25562 Pain in left knee: Secondary | ICD-10-CM | POA: Diagnosis not present

## 2021-04-22 DIAGNOSIS — R2689 Other abnormalities of gait and mobility: Secondary | ICD-10-CM | POA: Diagnosis not present

## 2021-04-22 DIAGNOSIS — M25561 Pain in right knee: Secondary | ICD-10-CM | POA: Insufficient documentation

## 2021-04-22 DIAGNOSIS — M25551 Pain in right hip: Secondary | ICD-10-CM | POA: Insufficient documentation

## 2021-04-22 DIAGNOSIS — R29898 Other symptoms and signs involving the musculoskeletal system: Secondary | ICD-10-CM

## 2021-04-22 DIAGNOSIS — M545 Low back pain, unspecified: Secondary | ICD-10-CM | POA: Insufficient documentation

## 2021-04-22 DIAGNOSIS — M25552 Pain in left hip: Secondary | ICD-10-CM | POA: Insufficient documentation

## 2021-04-22 NOTE — Therapy (Signed)
[place], [place], [address] Phone: [phone]   Fax:  [phone]  Physical Therapy Treatment  Patient Details  Name: Nicole [name] MRN: [id number] Date of Birth: 05/05/1946 Referring Provider (PT): Ofilia Neas, PA-C   Encounter Date: 04/22/2021   PT End of Session - 04/22/21 1533     Visit Number 7    Number of Visits 18    Date for PT Re-Evaluation 06/14/21    Authorization Type Primary Medicare Secondary T[employer] (no auth, no VL)    Progress Note Due on Visit 16    PT Start Time 1450    PT Stop Time 1528    PT Time Calculation (min) 38 min    Equipment Utilized During Treatment Gait belt    Activity Tolerance Patient limited by fatigue;Patient tolerated treatment well    Behavior During Therapy WFL for tasks assessed/performed             Past Medical History:  Diagnosis Date   AC (acromioclavicular) joint bone spurs    lt shoulder   Anemia    Arthritis    Carpal tunnel syndrome, bilateral    CHF (congestive heart failure) (HCC)    Collagen vascular disease ([place])    Gastroesophageal reflux    Headache    recent visit to ER @ [facility] for severe headache   Hypertension    Lumbar stenosis    Hx of ESIs by Dr. Nelva Bush   Polyarthralgia    Polymyalgia [facility])    Shingles    Spinal stenosis     Past Surgical History:  Procedure Laterality Date   BACK SURGERY  07/05/2018, 07/2018   x2    BIOPSY  09/22/2020   Procedure: BIOPSY;  Surgeon: Rogene Houston, MD;  [facility];  Service: Endoscopy;;   CHOLECYSTECTOMY     COLONOSCOPY  06/11/2012   Procedure: COLONOSCOPY;  Surgeon: Jamesetta So, MD;  [facility];  Service: Gastroenterology;  Laterality: N/A;   COLONOSCOPY WITH PROPOFOL N/A 09/22/2020   Procedure: COLONOSCOPY WITH PROPOFOL;  Surgeon: Rogene Houston, MD;  [facility];  Service: Endoscopy;  Laterality: N/A;  730   HYSTEROSCOPY WITH D & C N/A  02/25/2014   Procedure: DILATATION AND CURETTAGE /HYSTEROSCOPY;  Surgeon: Florian Buff, MD;  [facility];  Service: Gynecology;  Laterality: N/A;   POLYPECTOMY N/A 02/25/2014   Procedure: POLYPECTOMY;  Surgeon: Florian Buff, MD;  Location: [facility];  Service: Gynecology;  Laterality: N/A;   RESECTION DISTAL CLAVICAL Right 03/26/2015   Procedure: OPEN DISTAL CLAVICAL RESECTION ;  Surgeon: Netta Cedars, MD;  Location: [place];  Service: Orthopedics;  Laterality: Right;    There were no vitals filed for this visit.   Subjective Assessment - 04/22/21 1512     Subjective Pt stated she went to kidney MD yesterday who believes increased fluid is causing SOB.  Might refer her to lymphedema treatments again.  [name] [employer] engineer reports she received call from nurse and discussed rational for DC.    Pertinent History Patient is being seen in  physical therapy for  c/o back pain, knee pain and hip pain. Lumbar stenosis with 2 back surgeries.    Patient Stated Goals walk and drive car and lose weight    Currently in Pain? Yes    Pain Location Hip    Pain Orientation Right    Pain Onset More than a month ago  Pain Frequency Intermittent    Aggravating Factors  walking    Pain Relieving Factors not sure    Effect of Pain on Daily Activities limits                               [facility] Adult PT Treatment/Exercise - 04/22/21 0001       Ambulation/Gait   Ambulation/Gait Yes    Ambulation Distance (Feet) 171 Feet   3 bouts: 78, 58, 78ft   Assistive device Rolling walker    Gait Pattern Decreased stride length;Trunk flexed;Poor foot clearance - left;Poor foot clearance - right    Ambulation Surface Level;Indoor    Gait Comments 3 bouts with rest between      Knee/Hip Exercises: Standing   Hip Flexion 2 sets;5 reps    Hip Flexion Limitations in // bars with rest between sets    Other Standing Knee Exercises side stepping 2T in // bars with seated rests between each bout       Knee/Hip Exercises: Seated   Sit to Sand 5 reps;with UE support                      PT Short Term Goals - 04/19/21 1045       PT SHORT TERM GOAL #1   Title Patient will be independent with HEP in order to improve functional outcomes.    Time 4    Period Weeks    Status New    Target Date 05/17/21      PT SHORT TERM GOAL #2   Title Patient will be able to ambulate x 50 ft with RW and RPE no greater than 7/10    Baseline 9/10 RPE with ambulation x 48 ft    Period Weeks    Status New    Target Date 05/17/21      PT SHORT TERM GOAL #3   Title Patient will improve standing tolerance to 45 sec while performing dynamic upper body movements (reaching overhead to retrive sticker tiles) to improve effiiciency and safety with ADL performance    Baseline 20 sec with unilateral support    Time 4    Period Weeks    Status New    Target Date 05/17/21               PT Long Term Goals - 04/19/21 1048       PT LONG TERM GOAL #1   Title Patient will improve standing tolerance to 60 sec while performing dynamic upper body movements (reaching overhead to retrive sticker tiles) to improve effiiciency and safety with ADL performance    Baseline 20 sec    Time 8    Period Weeks    Status New    Target Date 06/14/21      PT LONG TERM GOAL #2   Title Patient will be able to ambulate x 50 ft with RW and RPE not exceeding 5/10 to manifest improve ambulation tolerance    Baseline 48 ft with RPE 9/10    Time 8    Period Weeks    Status New    Target Date 06/14/21                   Plan - 04/22/21 1543     Clinical Impression Statement Kidney doctor apt yesterday and reports of fluids causing increased SOB, pt concerned with this.  Reviewed lymphedema is  a disease not something that can be solved.  Pt wearing compression stocking and reports daily use of pump without compression.  Session focus on therapeutic activities and exercises for strengthening and activity  tolerance.  Pt required rest breaks between all exercises.  Was able to ambulate further distance prior request for rest break.  Cueing for posture, pacing herself with activity and to improve breathing.    Personal Factors and Comorbidities Age;Fitness;Behavior Pattern;Past/Current Experience;Comorbidity 3+;Social Background;Time since onset of injury/illness/exacerbation    Comorbidities CHF, RA, , polymyalgia, sedentary, obesity    Examination-Activity Limitations Locomotion Level;Transfers;Bathing;Bed Mobility;Stand;Stairs;Squat;Lift;Sit;Dressing;Hygiene/Grooming;Carry;Bend;Reach Overhead    Examination-Participation Restrictions Church;Meal Prep;Cleaning;Community Activity;Volunteer;Shop;Yard Work    Stability/Clinical Decision Making Unstable/Unpredictable    Clinical Decision Making Moderate    Rehab Potential Good    PT Frequency 2x / week    PT Duration 8 weeks    PT Treatment/Interventions Patient/family education;Manual lymph drainage;Compression bandaging;Moist Heat;Traction;Ultrasound;Gait training;Stair training;Functional mobility training;Therapeutic activities;Therapeutic exercise;Balance training;Neuromuscular re-education;Manual techniques;Passive range of motion;Energy conservation;Joint Manipulations;ADLs/Self Care Home Management;Aquatic Therapy;Electrical Stimulation    PT Next Visit Plan continue to progress functional strength and mobility.    PT Home Exercise Plan 5/17:  supine bridge, heelslides, lower trunk rotations; 5/20: sitting tall, scapular retraction 5/24 marching, LAQ, hip add iso; 6/3:  ankle pumps, LAQ, sitting marching, hip ab/adduction, diaphragmic breathing, and lymphatic squeeze.; 6/22:  ab set, sitting tall.    Consulted and Agree with Plan of Care Patient             Patient will benefit from skilled therapeutic intervention in order to improve the following deficits and impairments:  Decreased endurance, Obesity, Pain, Increased edema, Abnormal gait,  Decreased activity tolerance, Decreased balance, Decreased mobility, Decreased strength, Difficulty walking  Visit Diagnosis: Muscle weakness (generalized)  Other abnormalities of gait and mobility  Low back pain, unspecified back pain laterality, unspecified chronicity, unspecified whether sciatica present  Other symptoms and signs involving the musculoskeletal system  Bilateral hip pain  Pain in both knees, unspecified chronicity     Problem List Patient Active Problem List   Diagnosis Date Noted   Diverticulitis 05/02/2018   Diverticulitis of colon 05/01/2018   Morbid obesity with BMI of 50.0-59.9, adult ([place]) 05/01/2018   Chronic diastolic HF (heart failure) ([place]) 05/01/2018   On prednisone therapy 05/31/2017   Positive anti-CCP test 05/31/2017   Rheumatoid factor positive 05/31/2017   Memory loss 04/04/2017   Polymyalgia rheumatica ([place]) 04/04/2017   High risk medication use 12/07/2016   DDD (degenerative disc disease), lumbar 09/27/2016   HTN (hypertension) 06/05/2016   Chronic pain syndrome 06/05/2016   Acute diverticulitis 06/02/2016   Diverticulitis large intestine 06/02/2016   Spinal stenosis of lumbosacral region 02/29/2016   Joint pain 02/29/2016   Endometrial polyp 02/06/2014   Postmenopausal bleeding 01/30/2014   GERD (gastroesophageal reflux disease) 09/29/2013   Spinal stenosis, thoracic 09/29/2013   Shingles 09/29/2013   Thoracic or lumbosacral neuritis or radiculitis, unspecified 05/04/2011   Abnormality of gait 05/04/2011   Muscle weakness (generalized) 05/04/2011   Bilateral primary osteoarthritis of knee 04/07/2009   KNEE PAIN 04/07/2009   Ihor Austin, LPTA/CLT; CBIS [phone]  Aldona Lento 04/22/2021, 5:26 PM  [facility] [address] [place], [place], [address] Phone: [phone]   Fax:  [phone]  Name: Nicole [name] MRN: [id number] Date of Birth: 1946-03-06

## 2021-04-26 ENCOUNTER — Other Ambulatory Visit: Payer: Self-pay

## 2021-04-26 ENCOUNTER — Encounter (HOSPITAL_COMMUNITY): Payer: TRICARE For Life (TFL) | Admitting: Physical Therapy

## 2021-04-26 ENCOUNTER — Ambulatory Visit (HOSPITAL_COMMUNITY): Payer: Medicare Other | Admitting: Physical Therapy

## 2021-04-26 ENCOUNTER — Encounter (HOSPITAL_COMMUNITY): Payer: Self-pay | Admitting: Physical Therapy

## 2021-04-26 DIAGNOSIS — M6281 Muscle weakness (generalized): Secondary | ICD-10-CM

## 2021-04-26 DIAGNOSIS — R29898 Other symptoms and signs involving the musculoskeletal system: Secondary | ICD-10-CM | POA: Diagnosis not present

## 2021-04-26 DIAGNOSIS — R2689 Other abnormalities of gait and mobility: Secondary | ICD-10-CM | POA: Diagnosis not present

## 2021-04-26 DIAGNOSIS — M545 Low back pain, unspecified: Secondary | ICD-10-CM | POA: Diagnosis not present

## 2021-04-26 DIAGNOSIS — M25552 Pain in left hip: Secondary | ICD-10-CM | POA: Diagnosis not present

## 2021-04-26 DIAGNOSIS — M25561 Pain in right knee: Secondary | ICD-10-CM

## 2021-04-26 DIAGNOSIS — M25551 Pain in right hip: Secondary | ICD-10-CM | POA: Diagnosis not present

## 2021-04-26 NOTE — Therapy (Signed)
[place], [place], [address] Phone: [phone]   Fax:  [phone]  Physical Therapy Treatment  Patient Details  Name: Nicole [name] MRN: [id number] Date of Birth: Aug 05, 1946 Referring Provider (PT): Ofilia Neas, PA-C   Encounter Date: 04/26/2021   PT End of Session - 04/26/21 0933     Visit Number 8    Number of Visits 18    Date for PT Re-Evaluation 06/14/21    Authorization Type Primary Medicare Secondary Tricare (no auth, no VL)    Progress Note Due on Visit 1    PT Start Time 0932   arrives late   PT Stop Time 0955    PT Time Calculation (min) 23 min    Equipment Utilized During Treatment Gait belt    Activity Tolerance Patient limited by fatigue;Patient tolerated treatment well    Behavior During Therapy [facility] for tasks assessed/performed             Past Medical History:  Diagnosis Date   AC (acromioclavicular) joint bone spurs    lt shoulder   Anemia    Arthritis    Carpal tunnel syndrome, bilateral    CHF (congestive heart failure) (HCC)    Collagen vascular disease ([place])    Gastroesophageal reflux    Headache    recent visit to ER @ [facility] for severe headache   Hypertension    Lumbar stenosis    Hx of ESIs by Dr. Nelva Bush   Polyarthralgia    Polymyalgia [facility])    Shingles    Spinal stenosis     Past Surgical History:  Procedure Laterality Date   BACK SURGERY  07/05/2018, 07/2018   x2    BIOPSY  09/22/2020   Procedure: BIOPSY;  Surgeon: Rogene Houston, MD;  [facility];  Service: Endoscopy;;   CHOLECYSTECTOMY     COLONOSCOPY  06/11/2012   Procedure: COLONOSCOPY;  Surgeon: Jamesetta So, MD;  [facility];  Service: Gastroenterology;  Laterality: N/A;   COLONOSCOPY WITH PROPOFOL N/A 09/22/2020   Procedure: COLONOSCOPY WITH PROPOFOL;  Surgeon: Rogene Houston, MD;  [facility];  Service: Endoscopy;  Laterality: N/A;  730   HYSTEROSCOPY WITH D &  C N/A 02/25/2014   Procedure: DILATATION AND CURETTAGE /HYSTEROSCOPY;  Surgeon: Florian Buff, MD;  Location: [facility];  Service: Gynecology;  Laterality: N/A;   POLYPECTOMY N/A 02/25/2014   Procedure: POLYPECTOMY;  Surgeon: Florian Buff, MD;  Location: [facility];  Service: Gynecology;  Laterality: N/A;   RESECTION DISTAL CLAVICAL Right 03/26/2015   Procedure: OPEN DISTAL CLAVICAL RESECTION ;  Surgeon: Netta Cedars, MD;  Location: [place];  Service: Orthopedics;  Laterality: Right;    There were no vitals filed for this visit.   Subjective Assessment - 04/26/21 0934     Subjective Patient states out of no where here legs are sore and giving away some. She is not feeling the best today but she didn't want to miss session.    Pertinent History Patient is being seen in  physical therapy for  c/o back pain, knee pain and hip pain. Lumbar stenosis with 2 back surgeries.    Patient Stated Goals walk and drive car and lose weight    Currently in Pain? Yes    Pain Score 3     Pain Location Leg    Pain Orientation Right    Pain Descriptors / Indicators Aching  Pain Type Chronic pain    Pain Onset More than a month ago                               [facility] Adult PT Treatment/Exercise - 04/26/21 0001       Knee/Hip Exercises: Standing   Hip Flexion 2 sets;5 reps    Hip Flexion Limitations in // bars with rest between sets      Knee/Hip Exercises: Seated   Other Seated Knee/Hip Exercises hip isometrics abd/add 10x 5 second holds    Sit to Sand 5 reps;with UE support                    PT Education - 04/26/21 0934     Education Details HEP    Person(s) Educated Patient    Methods Explanation    Comprehension Verbalized understanding              PT Short Term Goals - 04/19/21 1045       PT SHORT TERM GOAL #1   Title Patient will be independent with HEP in order to improve functional outcomes.    Time 4    Period Weeks    Status New    Target Date  05/17/21      PT SHORT TERM GOAL #2   Title Patient will be able to ambulate x 50 ft with RW and RPE no greater than 7/10    Baseline 9/10 RPE with ambulation x 48 ft    Period Weeks    Status New    Target Date 05/17/21      PT SHORT TERM GOAL #3   Title Patient will improve standing tolerance to 45 sec while performing dynamic upper body movements (reaching overhead to retrive sticker tiles) to improve effiiciency and safety with ADL performance    Baseline 20 sec with unilateral support    Time 4    Period Weeks    Status New    Target Date 05/17/21               PT Long Term Goals - 04/19/21 1048       PT LONG TERM GOAL #1   Title Patient will improve standing tolerance to 60 sec while performing dynamic upper body movements (reaching overhead to retrive sticker tiles) to improve effiiciency and safety with ADL performance    Baseline 20 sec    Time 8    Period Weeks    Status New    Target Date 06/14/21      PT LONG TERM GOAL #2   Title Patient will be able to ambulate x 50 ft with RW and RPE not exceeding 5/10 to manifest improve ambulation tolerance    Baseline 48 ft with RPE 9/10    Time 8    Period Weeks    Status New    Target Date 06/14/21                   Plan - 04/26/21 0933     Clinical Impression Statement Session limited due to patient's late arrival. Patient fatigues quickly with standing exercises requiring frequent seated rest breaks. Patient given frequent cueing for posture with standing exercises with limited carry over. Patient fatigued at end of session. Patient will continue to benefit from skilled physical therapy in order to reduce impairment and improve function.    Personal Factors and Comorbidities Age;Fitness;Behavior  Pattern;Past/Current Experience;Comorbidity 3+;Social Background;Time since onset of injury/illness/exacerbation    Comorbidities CHF, RA, , polymyalgia, sedentary, obesity    Examination-Activity Limitations  Locomotion Level;Transfers;Bathing;Bed Mobility;Stand;Stairs;Squat;Lift;Sit;Dressing;Hygiene/Grooming;Carry;Bend;Reach Overhead    Examination-Participation Restrictions Church;Meal Prep;Cleaning;Community Activity;Volunteer;Shop;Yard Work    Stability/Clinical Decision Making Unstable/Unpredictable    Rehab Potential Good    PT Frequency 2x / week    PT Duration 8 weeks    PT Treatment/Interventions Patient/family education;Manual lymph drainage;Compression bandaging;Moist Heat;Traction;Ultrasound;Gait training;Stair training;Functional mobility training;Therapeutic activities;Therapeutic exercise;Balance training;Neuromuscular re-education;Manual techniques;Passive range of motion;Energy conservation;Joint Manipulations;ADLs/Self Care Home Management;Aquatic Therapy;Electrical Stimulation    PT Next Visit Plan continue to progress functional strength and mobility.    PT Home Exercise Plan 5/17:  supine bridge, heelslides, lower trunk rotations; 5/20: sitting tall, scapular retraction 5/24 marching, LAQ, hip add iso; 6/3:  ankle pumps, LAQ, sitting marching, hip ab/adduction, diaphragmic breathing, and lymphatic squeeze.; 6/22:  ab set, sitting tall.    Consulted and Agree with Plan of Care Patient             Patient will benefit from skilled therapeutic intervention in order to improve the following deficits and impairments:  Decreased endurance, Obesity, Pain, Increased edema, Abnormal gait, Decreased activity tolerance, Decreased balance, Decreased mobility, Decreased strength, Difficulty walking  Visit Diagnosis: Muscle weakness (generalized)  Other abnormalities of gait and mobility  Low back pain, unspecified back pain laterality, unspecified chronicity, unspecified whether sciatica present  Other symptoms and signs involving the musculoskeletal system  Bilateral hip pain  Pain in both knees, unspecified chronicity     Problem List Patient Active Problem List   Diagnosis  Date Noted   Diverticulitis 05/02/2018   Diverticulitis of colon 05/01/2018   Morbid obesity with BMI of 50.0-59.9, adult ([place]) 05/01/2018   Chronic diastolic HF (heart failure) ([place]) 05/01/2018   On prednisone therapy 05/31/2017   Positive anti-CCP test 05/31/2017   Rheumatoid factor positive 05/31/2017   Memory loss 04/04/2017   Polymyalgia rheumatica ([place]) 04/04/2017   High risk medication use 12/07/2016   DDD (degenerative disc disease), lumbar 09/27/2016   HTN (hypertension) 06/05/2016   Chronic pain syndrome 06/05/2016   Acute diverticulitis 06/02/2016   Diverticulitis large intestine 06/02/2016   Spinal stenosis of lumbosacral region 02/29/2016   Joint pain 02/29/2016   Endometrial polyp 02/06/2014   Postmenopausal bleeding 01/30/2014   GERD (gastroesophageal reflux disease) 09/29/2013   Spinal stenosis, thoracic 09/29/2013   Shingles 09/29/2013   Thoracic or lumbosacral neuritis or radiculitis, unspecified 05/04/2011   Abnormality of gait 05/04/2011   Muscle weakness (generalized) 05/04/2011   Bilateral primary osteoarthritis of knee 04/07/2009   KNEE PAIN 04/07/2009    9:57 AM, 04/26/21 Mearl Latin PT, DPT Physical Therapist at [place], [place], [address] Phone: [phone]   Fax:  [phone]  Name: Shaquetta Arcos MRN: [id number] Date of Birth: [birth year]

## 2021-04-26 NOTE — Progress Notes (Signed)
Cardiology Office Note  Date: 04/27/2021   ID: Nicole [name], DOB 1946-06-28, MRN [id number]  PCP:  Jake Samples, PA-C  Cardiologist:  None Electrophysiologist:  None   Chief Complaint: Follow-up, CHF, near syncope, HTN  History of Present Illness: Nicole [name] is a 75 y.o. female with a history of CHF, near syncope, HTN, polymyalgia rheumatica.  Last seen by Dr. Bronson Ing on 04/17/2018 for evaluation of near syncope and CHF at request of PCP.  She is experiencing bilateral leg edema and exertional dyspnea.  She was taking Lasix 20 mg daily, losartan hydrochlorothiazide, with potassium supplementation.  An echocardiogram was orderedShowing EF 65 to 70%.  Grade 1 DD.  Mild concentric and moderate basal septal hypertrophy.  At last visit she was having a lot of trouble with her back with sciatica-like pain.  She had a recent emergency room visit to [facility] for the same complaints.  She was on Nucynta extended release 150 mg every 12 hours.  We do not have the records from that visit.   Patient was last here for follow-up and stated she had an interim back surgery x2 since prior visit.   She states also during the hospital visit for her back surgery she had a pulmonary embolism.  She was continuing with some physical therapy but still having problems walking and weakness in lower legs..  Was still having some issues with decreased sensation and function of her left leg.  She is in a wheelchair.  Had continued complaints of some lower extremity edema with some weeping in her left lower extremity.  She had a dressing on the left lower extremity and compression hose on her right lower extremity.  Her blood pressure was well controlled at 110/70.   She is here today for follow-up for recent echocardiogram for complaints of shortness of breath.  Echocardiogram 04/14/2021 showed EF of greater than 75%.  No WMA's.  Mild LVH.  Indeterminate diastolic function due to EA fusion.  Trivial MR.  Continues to complain of some mild shortness of breath.  Also states she has had some right inframammary pain which sometimes radiates over to midsternal area not necessarily related to activity.  She denies any radiation to neck, arm, back or jaw.  Denies any diaphoresis or nausea associated.  She states sometimes the pain occurs after she eats with some associated after eating.  She states a few weeks back she had issue with constipation and the day after she had some what she described as dark maroon stools but since that time she is voiced no other issues with evidence of blood in her stool.  She does take PPI for GERD.  She states she is undergoing compression therapy for lymphedema at home.  She is also undergoing orthopedic rehab at [facility] outpatient rehab center.   Past Medical History:  Diagnosis Date   AC (acromioclavicular) joint bone spurs    lt shoulder   Anemia    Arthritis    Carpal tunnel syndrome, bilateral    CHF (congestive heart failure) (HCC)    Collagen vascular disease (HCC)    Gastroesophageal reflux    Headache    recent visit to ER @ [facility] for severe headache   Hypertension    Lumbar stenosis    Hx of ESIs by Dr. Nelva Bush   Polyarthralgia    Polymyalgia [facility])    Shingles    Spinal stenosis     Past Surgical History:  Procedure Laterality Date  BACK SURGERY  07/05/2018, 07/2018   x2    BIOPSY  09/22/2020   Procedure: BIOPSY;  Surgeon: Rogene Houston, MD;  [facility];  Service: Endoscopy;;   CHOLECYSTECTOMY     COLONOSCOPY  06/11/2012   Procedure: COLONOSCOPY;  Surgeon: Jamesetta So, MD;  [facility];  Service: Gastroenterology;  Laterality: N/A;   COLONOSCOPY WITH PROPOFOL N/A 09/22/2020   Procedure: COLONOSCOPY WITH PROPOFOL;  Surgeon: Rogene Houston, MD;  [facility];  Service: Endoscopy;  Laterality: N/A;  730   HYSTEROSCOPY WITH D & C N/A 02/25/2014   Procedure: DILATATION AND CURETTAGE /HYSTEROSCOPY;   Surgeon: Florian Buff, MD;  Location: [facility];  Service: Gynecology;  Laterality: N/A;   POLYPECTOMY N/A 02/25/2014   Procedure: POLYPECTOMY;  Surgeon: Florian Buff, MD;  Location: [facility];  Service: Gynecology;  Laterality: N/A;   RESECTION DISTAL CLAVICAL Right 03/26/2015   Procedure: OPEN DISTAL CLAVICAL RESECTION ;  Surgeon: Netta Cedars, MD;  Location: [place];  Service: Orthopedics;  Laterality: Right;    Current Outpatient Medications  Medication Sig Dispense Refill   acetaminophen (TYLENOL) 325 MG tablet Take 2 tablets (650 mg total) by mouth every 6 (six) hours as needed for mild pain or headache (or Fever >/= 101).     atorvastatin (LIPITOR) 20 MG tablet Take 20 mg by mouth daily.     calcitRIOL (ROCALTROL) 0.25 MCG capsule Take 0.25 mcg by mouth See admin instructions. Take 0.25 every Mon, Wed, Fri.     cyclobenzaprine (FLEXERIL) 5 MG tablet Take 5 mg by mouth every 12 (twelve) hours as needed for muscle spasms.      diclofenac (VOLTAREN) 75 MG EC tablet Take 75 mg by mouth daily.     diclofenac Sodium (VOLTAREN) 1 % GEL Apply 2 g topically 4 (four) times daily as needed (pain).      gabapentin (NEURONTIN) 400 MG capsule Take 400 mg by mouth 3 (three) times daily.      leflunomide (ARAVA) 10 MG tablet TAKE (1) TABLET BY MOUTH DAILY. (Patient taking differently: Take 10 mg by mouth daily.) 90 tablet 1   lidocaine (LIDODERM) 5 % Place 1-2 patches onto the skin every 12 (twelve) hours as needed (pain).      Multiple Vitamins-Minerals (MULTIVITAMIN PO) Take 1 tablet by mouth daily.      pantoprazole (PROTONIX) 40 MG tablet Take 40 mg by mouth 2 (two) times daily.      potassium chloride (KLOR-CON) 10 MEQ tablet Take 10 mEq by mouth daily.     predniSONE (DELTASONE) 5 MG tablet TAKE 2 TABLETS BY MOUTH ONCE DAILY WITH BREAKFAST. (Patient taking differently: Take 10 mg by mouth daily with breakfast. TAKE 2 TABLETS BY MOUTH ONCE DAILY WITH BREAKFAST.) 60 tablet 0   Sarilumab (KEVZARA) 200  MG/1.14ML SOAJ Inject 200 mg into the skin every 14 (fourteen) days. 6 mL 0   silver sulfADIAZINE (SILVADENE) 1 % cream Apply 1 application topically 2 (two) times daily.     Simethicone (GAS-X PO) Take 1 tablet by mouth 3 (three) times daily as needed (flatulence).      tapentadol (NUCYNTA) 50 MG tablet Take 50 mg by mouth daily as needed for moderate pain.     Tapentadol HCl (NUCYNTA ER) 200 MG TB12 Take 200 mg by mouth every 12 (twelve) hours.     torsemide (DEMADEX) 100 MG tablet Take 100 mg by mouth 2 (two) times daily. Take 100mg  by mouth  in the morning and 50mg  in the evening.     torsemide (DEMADEX) 20 MG tablet Take 40 mg by mouth in the morning, at noon, and at bedtime.     No current facility-administered medications for this visit.   Allergies:  Oxycontin [oxycodone hcl], Sulfa antibiotics, Tramadol, Penicillins, and Sulfasalazine   Social History: The patient  reports that she has never smoked. She has never used smokeless tobacco. She reports that she does not drink alcohol and does not use drugs.   Family History: The patient's family history includes Arthritis in her sister; Diabetes in her paternal grandmother; Hypertension in her mother; Pneumonia in her father.   ROS:  Please see the history of present illness. Otherwise, complete review of systems is positive for none.  All other systems are reviewed and negative.   Physical Exam: VS:  BP (!) 134/92   Pulse (!) 106   Ht 5' (1.524 m)   Wt 250 lb 12.8 oz (113.8 kg)   SpO2 95%   BMI 48.98 kg/m , BMI Body mass index is 48.98 kg/m.  Wt Readings from Last 3 Encounters:  04/27/21 250 lb 12.8 oz (113.8 kg)  03/31/21 250 lb (113.4 kg)  03/15/21 254 lb 12.8 oz (115.6 kg)    General: Morbidly obese patient appears comfortable at rest. Neck: Supple, no elevated JVP or carotid bruits, no thyromegaly. Lungs: Clear to auscultation, nonlabored breathing at rest. Cardiac: Regular rate and rhythm, no S3 or significant systolic  murmur, no pericardial rub. Extremities: Has bilateral lower extremity edema, distal pulses 2+. Skin: Warm and dry. Musculoskeletal: No kyphosis. Neuropsychiatric: Alert and oriented x3, affect grossly appropriate.  ECG:  An ECG dated 03/15/2021 was personally reviewed today and demonstrated:  Normal sinus rhythm rate of 94, left axis deviation, inferior infarct, age undetermined.  Recent Labwork: 01/27/2021: ALT 19; AST 19; BUN 28; Creat 1.36; Hemoglobin 13.6; Platelets 396; Potassium 4.2; Sodium 141  No results found for: CHOL, TRIG, HDL, CHOLHDL, VLDL, LDLCALC, LDLDIRECT  Other Studies Reviewed Today:   Echocardiogram 04/14/2021 IMPRESSIONS     1. Left ventricular ejection fraction, by estimation, is >75%. The left  ventricle has hyperdynamic function. The left ventricle has no regional  wall motion abnormalities. There is mild left ventricular hypertrophy.  Indeterminate diastolic filling due to   E-A fusion.   2. Right ventricular systolic function is normal. The right ventricular  size is normal.   3. Trivial mitral valve regurgitation.   4. The aortic valve is tricuspid. Aortic valve regurgitation is not  visualized. Mild to moderate aortic valve sclerosis/calcification is  present, without any evidence of aortic stenosis.   5. The inferior vena cava is normal in size with greater than 50%  respiratory variability, suggesting right atrial pressure of 3 mmHg.    Echocardiogram 04/08/2020 1. Left ventricular ejection fraction, by estimation, is 60 to 65%. The left ventricle has normal function. The left ventricle has no regional wall motion abnormalities. There is mild left ventricular hypertrophy. Left ventricular diastolic parameters are consistent with Grade I diastolic dysfunction (impaired relaxation). 2. Right ventricular systolic function is normal. The right ventricular size is normal. There is normal pulmonary artery systolic pressure. 3. The mitral valve is normal  in structure. Trivial mitral valve regurgitation. No evidence of mitral stenosis. 4. The aortic valve is tricuspid. Aortic valve regurgitation is not visualized. No aortic stenosis is present. Comparison(s): Echocardiogram done 04/24/18 showed an EF of 70%.    Echocardiogram 04/24/2018 Study Conclusions  - Left  ventricle: The cavity size was normal. Systolic function was    vigorous. The estimated ejection fraction was in the range of 65%    to 70%. Wall motion was normal; there were no regional wall    motion abnormalities. Doppler parameters are consistent with    abnormal left ventricular relaxation (grade 1 diastolic    dysfunction). Doppler parameters are consistent with high    ventricular filling pressure. Mild concentric and moderate basal    septal hypertrophy.  - Aortic valve: Moderately calcified annulus. Trileaflet. Valve    area (VTI): 2.08 cm^2. Valve area (Vmax): 2.11 cm^2. Valve area    (Vmean): 2.4 cm^2.  - Mitral valve: Calcified annulus. Mildly thickened leaflets .   Assessment and Plan:  1. Chronic diastolic heart failure ([place]) States she has had some recent increase in dyspnea.  She has not been active recently due to back surgery and subsequent PE.  She has gained some weight.  Today's weight is 250..  Lungs are CTA all fields.  She is currently taking torsemide for edema..  Torsemide dosage is 100 mg a.m. and 50 mg p.m..  Recent echocardiogram 04/14/2021 showed EF of greater than 75%.  No WMA's.  Mild LVH.  Indeterminate diastolic function due to EA fusion.  Trivial MR. Continues to complain of some mild shortness of breath.  Continue torsemide 100 mg a.m. and 50 mg p.m.  Continue potassium supplementation.  2. Essential hypertension Blood pressure today 110/70.  Continue torsemide 100 mg a.m. and 50 mg p.m. per nephrology.  Continue potassium supplementation 10 mEq daily.  3.  Venous insufficiency/lymphedema Patient states he is now on home compression therapy for  lymphedema/venous insufficiency.  4.  Chest discomfort/pain She complains of sporadic right precordial pain which sometimes radiates over to the mid epigastric/midsternal area.  States is not necessarily associated with exertion.  Sometimes aggravated by eating.  She does have GERD on PPI.  Advised her if symptoms become more frequent with longer duration, increasing intensity, or associated symptoms such as nausea, vomiting, or diaphoresis we would likely need to perform nuclear Lexiscan stress test.  She verbalized understanding.   Medication Adjustments/Labs and Tests Ordered: Current medicines are reviewed at length with the patient today.  Concerns regarding medicines are outlined above.   Disposition: Follow-up with Dr Domenic Polite or APP 6 to 8 weeks Signed, Levell July, NP 04/27/2021 1:36 PM    [facility] at [place], [place], [place] [address] Phone: [phone]; Fax: [phone]

## 2021-04-27 ENCOUNTER — Encounter: Payer: Self-pay | Admitting: Family Medicine

## 2021-04-27 ENCOUNTER — Ambulatory Visit (HOSPITAL_COMMUNITY): Payer: Medicare Other

## 2021-04-27 ENCOUNTER — Telehealth (HOSPITAL_COMMUNITY): Payer: Self-pay

## 2021-04-27 ENCOUNTER — Ambulatory Visit (INDEPENDENT_AMBULATORY_CARE_PROVIDER_SITE_OTHER): Payer: Medicare Other | Admitting: Family Medicine

## 2021-04-27 ENCOUNTER — Encounter (HOSPITAL_COMMUNITY): Payer: TRICARE For Life (TFL)

## 2021-04-27 VITALS — BP 134/92 | HR 106 | Ht 60.0 in | Wt 250.8 lb

## 2021-04-27 DIAGNOSIS — I5032 Chronic diastolic (congestive) heart failure: Secondary | ICD-10-CM | POA: Diagnosis not present

## 2021-04-27 DIAGNOSIS — I872 Venous insufficiency (chronic) (peripheral): Secondary | ICD-10-CM | POA: Diagnosis not present

## 2021-04-27 DIAGNOSIS — R0789 Other chest pain: Secondary | ICD-10-CM

## 2021-04-27 DIAGNOSIS — I10 Essential (primary) hypertension: Secondary | ICD-10-CM | POA: Diagnosis not present

## 2021-04-27 NOTE — Patient Instructions (Signed)
Medication Instructions:  Your physician recommends that you continue on your current medications as directed. Please refer to the Current Medication list given to you today.  *If you need a refill on your cardiac medications before your next appointment, please call your pharmacy*   Lab Work: None If you have labs (blood work) drawn today and your tests are completely normal, you will receive your results only by: [place] (if you have MyChart) OR A paper copy in the mail If you have any lab test that is abnormal or we need to change your treatment, we will call you to review the results.   Testing/Procedures: None   Follow-Up: At [facility], you and your health needs are our priority.  As part of our continuing mission to provide you with exceptional heart care, we have created designated Provider Care Teams.  These Care Teams include your primary Cardiologist (physician) and Advanced Practice Providers (APPs -  Physician Assistants and Nurse Practitioners) who all work together to provide you with the care you need, when you need it.  We recommend signing up for the patient portal called "MyChart".  Sign up information is provided on this After Visit Summary.  MyChart is used to connect with patients for Virtual Visits (Telemedicine).  Patients are able to view lab/test results, encounter notes, upcoming appointments, etc.  Non-urgent messages can be sent to your provider as well.   To learn more about what you can do with MyChart, go to [email or web address].    Your next appointment:   6 month(s)  The format for your next appointment:   In Person  Provider:   Katina Dung, NP   Other Instructions

## 2021-04-27 NOTE — Telephone Encounter (Signed)
Pt called she just got back from the MD and is very tired - she cx and r/s for tomorrow

## 2021-04-28 ENCOUNTER — Other Ambulatory Visit: Payer: Self-pay

## 2021-04-28 ENCOUNTER — Ambulatory Visit (HOSPITAL_COMMUNITY): Payer: Medicare Other | Admitting: Physical Therapy

## 2021-04-28 DIAGNOSIS — M6281 Muscle weakness (generalized): Secondary | ICD-10-CM | POA: Diagnosis not present

## 2021-04-28 DIAGNOSIS — R2689 Other abnormalities of gait and mobility: Secondary | ICD-10-CM

## 2021-04-28 DIAGNOSIS — R29898 Other symptoms and signs involving the musculoskeletal system: Secondary | ICD-10-CM

## 2021-04-28 DIAGNOSIS — M25552 Pain in left hip: Secondary | ICD-10-CM | POA: Diagnosis not present

## 2021-04-28 DIAGNOSIS — M545 Low back pain, unspecified: Secondary | ICD-10-CM | POA: Diagnosis not present

## 2021-04-28 DIAGNOSIS — M25551 Pain in right hip: Secondary | ICD-10-CM | POA: Diagnosis not present

## 2021-04-28 NOTE — Progress Notes (Signed)
Office Visit Note  Patient: Nicole [name]             Date of Birth: [birth year]           MRN: [id number]             PCP: Jake Samples, PA-C Referring: Sharilyn Sites, MD Visit Date: 05/12/2021 Occupation: @GUAROCC @  Subjective:  Medication management.   History of Present Illness: Nicole [name] is a 75 y.o. female with history of polymyalgia, temporal arteritis, rheumatoid arthritis and osteoarthritis.  She was accompanied by her son today.  She states that she has not noticed great improvement on Kevzara because when she stopped prednisone for 2 days she started experiencing increased pain.  She has not noticed any joint swelling.  She continues to have joint pain.  She states she also has been struggling from hemorrhoids and constipation for which she has been seeing gastroenterologist.  She has been seeing Dr. Theador Hawthorne for low GFR.  Activities of Daily Living:  Patient reports morning stiffness for 1-2 hours.   Patient Denies nocturnal pain.  Difficulty dressing/grooming: Denies Difficulty climbing stairs: Reports Difficulty getting out of chair: Reports Difficulty using hands for taps, buttons, cutlery, and/or writing: Reports  Review of Systems  Constitutional:  Positive for fatigue.  HENT:  Positive for ear ringing and mouth dryness. Negative for mouth sores and nose dryness.   Eyes:  Negative for pain, itching and dryness.  Respiratory:  Positive for shortness of breath. Negative for difficulty breathing.   Cardiovascular:  Negative for chest pain and palpitations.  Gastrointestinal:  Negative for blood in stool, constipation and diarrhea.  Endocrine: Negative for increased urination.  Genitourinary:  Negative for difficulty urinating.  Musculoskeletal:  Positive for joint pain, joint pain, joint swelling, myalgias, morning stiffness, muscle tenderness and myalgias.  Skin:  Negative for color change, rash and redness.  Allergic/Immunologic: Negative for susceptible to  infections.  Neurological:  Positive for numbness, headaches and weakness. Negative for dizziness.  Hematological:  Positive for bruising/bleeding tendency.  Psychiatric/Behavioral:  Negative for sleep disturbance.    PMFS History:  Patient Active Problem List   Diagnosis Date Noted   Diverticulitis 05/02/2018   Diverticulitis of colon 05/01/2018   Morbid obesity with BMI of 50.0-59.9, adult ([place]) 05/01/2018   Chronic diastolic HF (heart failure) ([place]) 05/01/2018   On prednisone therapy 05/31/2017   Positive anti-CCP test 05/31/2017   Rheumatoid factor positive 05/31/2017   Memory loss 04/04/2017   Polymyalgia rheumatica ([place]) 04/04/2017   High risk medication use 12/07/2016   DDD (degenerative disc disease), lumbar 09/27/2016   HTN (hypertension) 06/05/2016   Chronic pain syndrome 06/05/2016   Acute diverticulitis 06/02/2016   Diverticulitis large intestine 06/02/2016   Spinal stenosis of lumbosacral region 02/29/2016   Joint pain 02/29/2016   Endometrial polyp 02/06/2014   Postmenopausal bleeding 01/30/2014   GERD (gastroesophageal reflux disease) 09/29/2013   Spinal stenosis, thoracic 09/29/2013   Shingles 09/29/2013   Thoracic or lumbosacral neuritis or radiculitis, unspecified 05/04/2011   Abnormality of gait 05/04/2011   Muscle weakness (generalized) 05/04/2011   Bilateral primary osteoarthritis of knee 04/07/2009   KNEE PAIN 04/07/2009    Past Medical History:  Diagnosis Date   AC (acromioclavicular) joint bone spurs    lt shoulder   Anemia    Arthritis    Carpal tunnel syndrome, bilateral    CHF (congestive heart failure) (HCC)    Collagen vascular disease (HCC)    Gastroesophageal reflux    Headache  recent visit to ER @ [facility] for severe headache   Hypertension    Lumbar stenosis    Hx of ESIs by Dr. Nelva Bush   Polyarthralgia    Polymyalgia [facility])    Shingles    Spinal stenosis     Family History  Problem Relation Age of Onset   Hypertension  Mother    Pneumonia Father    Arthritis Sister    Diabetes Paternal Grandmother    Past Surgical History:  Procedure Laterality Date   BACK SURGERY  07/05/2018, 07/2018   x2    BIOPSY  09/22/2020   Procedure: BIOPSY;  Surgeon: Rogene Houston, MD;  [facility];  Service: Endoscopy;;   CHOLECYSTECTOMY     COLONOSCOPY  06/11/2012   Procedure: COLONOSCOPY;  Surgeon: Jamesetta So, MD;  Location: [facility];  Service: Gastroenterology;  Laterality: N/A;   COLONOSCOPY WITH PROPOFOL N/A 09/22/2020   Procedure: COLONOSCOPY WITH PROPOFOL;  Surgeon: Rogene Houston, MD;  Location: [facility];  Service: Endoscopy;  Laterality: N/A;  730   HYSTEROSCOPY WITH D & C N/A 02/25/2014   Procedure: DILATATION AND CURETTAGE /HYSTEROSCOPY;  Surgeon: Florian Buff, MD;  Location: [facility];  Service: Gynecology;  Laterality: N/A;   POLYPECTOMY N/A 02/25/2014   Procedure: POLYPECTOMY;  Surgeon: Florian Buff, MD;  Location: [facility];  Service: Gynecology;  Laterality: N/A;   RESECTION DISTAL CLAVICAL Right 03/26/2015   Procedure: OPEN DISTAL CLAVICAL RESECTION ;  Surgeon: Netta Cedars, MD;  Location: [facility];  Service: Orthopedics;  Laterality: Right;   Social History   Social History Narrative   Lives at home alone.   Right-handed.   1-2 cups caffeine daily.   Immunization History  Administered Date(s) Administered   Influenza,inj,quad, With Preservative 07/23/2017   Unspecified SARS-COV-2 Vaccination 06/02/2020, 06/23/2020     Objective: Vital Signs: BP 139/89 (BP Location: Left Wrist, Patient Position: Sitting, Cuff Size: Normal)   Pulse 96   Ht 5' (1.524 m)   Wt 251 lb 12.8 oz (114.2 kg) Comment: per patient  BMI 49.18 kg/m    Physical Exam Vitals and nursing note reviewed. Chaperone present: Patient is in wheelchair.  Constitutional:      Appearance: She is well-developed.  HENT:     Head: Normocephalic and atraumatic.  Eyes:     Conjunctiva/sclera: Conjunctivae normal.   Cardiovascular:     Rate and Rhythm: Normal rate and regular rhythm.     Heart sounds: Normal heart sounds.  Pulmonary:     Effort: Pulmonary effort is normal.     Breath sounds: Normal breath sounds.  Abdominal:     General: Bowel sounds are normal.     Palpations: Abdomen is soft.  Musculoskeletal:     Cervical back: Normal range of motion.  Lymphadenopathy:     Cervical: No cervical adenopathy.  Skin:    General: Skin is warm and dry.     Capillary Refill: Capillary refill takes less than 2 seconds.  Neurological:     Mental Status: She is alert and oriented to person, place, and time.     Comments: Left foot drop  Psychiatric:        Behavior: Behavior normal.     Musculoskeletal Exam: She has good range of motion of her cervical spine.  Left shoulder joint abduction was limited to 90 degrees.  Right shoulder joint was in full range of motion.  Joints with good range of motion.  She had  no synovitis over MCPs or PIPs.  Hip joints were difficult to assess in the wheelchair.  Knee joints in good range of motion.  No warmth swelling or effusion was noted.  CDAI Exam: CDAI Score: 0.8  Patient Global: 4 mm; Provider Global: 4 mm Swollen: 0 ; Tender: 0  Joint Exam 05/12/2021   No joint exam has been documented for this visit   There is currently no information documented on the homunculus. Go to the Rheumatology activity and complete the homunculus joint exam.  Investigation: No additional findings.  Imaging: ECHOCARDIOGRAM COMPLETE  Result Date: 04/14/2021    ECHOCARDIOGRAM REPORT   Patient Name:   KYLE STANSELL Date of Exam: 04/14/2021 Medical Rec #:  [id number]    Height:       60.0 in Accession #:    [id number]   Weight:       250.0 lb Date of Birth:  06-05-1946     BSA:          2.052 m Patient Age:    68 years     BP:           133/84 mmHg Patient Gender: F            HR:           104 bpm. Exam Location:  [facility] Procedure: 2D Echo, Cardiac Doppler and Color Doppler  Indications:    R06.02 (ICD-10-CM) - SOB (shortness of breath)  History:        Patient has prior history of Echocardiogram examinations, most                 recent 04/08/2020. CHF; Risk Factors:Hypertension and Non-Smoker.                 Morbidly obese.  Sonographer:    Alvino Chapel RCS Referring Phys: [phone] Verta Ellen. IMPRESSIONS  1. Left ventricular ejection fraction, by estimation, is >75%. The left ventricle has hyperdynamic function. The left ventricle has no regional wall motion abnormalities. There is mild left ventricular hypertrophy. Indeterminate diastolic filling due to  E-A fusion.  2. Right ventricular systolic function is normal. The right ventricular size is normal.  3. Trivial mitral valve regurgitation.  4. The aortic valve is tricuspid. Aortic valve regurgitation is not visualized. Mild to moderate aortic valve sclerosis/calcification is present, without any evidence of aortic stenosis.  5. The inferior vena cava is normal in size with greater than 50% respiratory variability, suggesting right atrial pressure of 3 mmHg. FINDINGS  Left Ventricle: Left ventricular ejection fraction, by estimation, is >75%. The left ventricle has hyperdynamic function. The left ventricle has no regional wall motion abnormalities. The left ventricular internal cavity size was normal in size. There is mild left ventricular hypertrophy. Indeterminate diastolic filling due to E-A fusion. Right Ventricle: The right ventricular size is normal. Right vetricular wall thickness was not assessed. Right ventricular systolic function is normal. Left Atrium: Left atrial size was normal in size. Right Atrium: Right atrial size was normal in size. Pericardium: There is no evidence of pericardial effusion. Mitral Valve: There is mild thickening of the mitral valve leaflet(s). There is mild calcification of the mitral valve leaflet(s). Mild to moderate mitral annular calcification. Trivial mitral valve regurgitation.  Tricuspid Valve: The tricuspid valve is normal in structure. Tricuspid valve regurgitation is trivial. Aortic Valve: The aortic valve is tricuspid. Aortic valve regurgitation is not visualized. Mild to moderate aortic valve sclerosis/calcification is present, without any evidence of  aortic stenosis. Aortic valve mean gradient measures 6.0 mmHg. Aortic valve peak gradient measures 14.9 mmHg. Aortic valve area, by VTI measures 1.53 cm. Pulmonic Valve: The pulmonic valve was not well visualized. Aorta: The aortic root is normal in size and structure. Venous: The inferior vena cava is normal in size with greater than 50% respiratory variability, suggesting right atrial pressure of 3 mmHg. IAS/Shunts: No atrial level shunt detected by color flow Doppler.  LEFT VENTRICLE PLAX 2D LVIDd:         3.90 cm LVIDs:         2.00 cm LV PW:         1.20 cm LV IVS:        1.50 cm LVOT diam:     1.70 cm LV SV:         44 LV SV Index:   22 LVOT Area:     2.27 cm  RIGHT VENTRICLE RV S prime:     16.90 cm/s TAPSE (M-mode): 2.0 cm LEFT ATRIUM             Index LA diam:        3.00 cm 1.46 cm/m LA Vol (A2C):   25.5 ml 12.43 ml/m LA Vol (A4C):   33.5 ml 16.32 ml/m LA Biplane Vol: 29.7 ml 14.47 ml/m  AORTIC VALVE AV Area (Vmax):    1.29 cm AV Area (Vmean):   1.54 cm AV Area (VTI):     1.53 cm AV Vmax:           193.00 cm/s AV Vmean:          116.000 cm/s AV VTI:            0.289 m AV Peak Grad:      14.9 mmHg AV Mean Grad:      6.0 mmHg LVOT Vmax:         110.00 cm/s LVOT Vmean:        78.700 cm/s LVOT VTI:          0.195 m LVOT/AV VTI ratio: 0.67  AORTA Ao Root diam: 3.20 cm MITRAL VALVE MV Area (PHT): 2.56 cm     SHUNTS MV Decel Time: 296 msec     Systemic VTI:  0.20 m MV E velocity: 70.00 cm/s   Systemic Diam: 1.70 cm MV A velocity: 128.00 cm/s MV E/A ratio:  0.55 Dorris Carnes MD Electronically signed by Dorris Carnes MD Signature Date/Time: 04/14/2021/5:23:58 PM    Final     Recent Labs: Lab Results  Component Value Date   WBC  9.0 05/09/2021   HGB 12.8 05/09/2021   PLT 283 05/09/2021   NA 143 05/09/2021   K 3.6 05/09/2021   CL 101 05/09/2021   CO2 33 (H) 05/09/2021   GLUCOSE 87 05/09/2021   BUN 45 (H) 05/09/2021   CREATININE 1.60 (H) 05/09/2021   BILITOT 0.4 05/09/2021   ALKPHOS 158 (H) 10/30/2019   AST 22 05/09/2021   ALT 25 05/09/2021   PROT 6.4 05/09/2021   ALBUMIN 4.1 10/30/2019   CALCIUM 9.5 05/09/2021   GFRAA 44 (L) 01/27/2021   QFTBGOLDPLUS NEGATIVE 09/09/2020    Speciality Comments: PLQ Eye Exam: 12/13/17 [facility] @ [place]  Procedures:  No procedures performed Allergies: Oxycontin [oxycodone hcl], Sulfa antibiotics, Tramadol, Penicillins, and Sulfasalazine   Assessment / Plan:     Visit Diagnoses: Rheumatoid arthritis involving multiple sites with positive rheumatoid factor (HCC)-patient had no synovitis on my examination.  She has  been on Kevzara since March 2022.  I believe the combination has been working well for her.  She continues to have joint pain and discomfort.  She states that she missed 2 doses of prednisone and started experiencing increased pain.  I had detailed discussion with the patient and her son that she is not supposed to skip prednisone as she has been on prednisone for a long time.  She would have to taper prednisone gradually.  High risk medication use - Kevzara 200 mg sq injections every 14 days-started on 12/21/20,  Arava 10 mg 1 tablet by mouth daily and prednisone 5 mg 2 tablets daily.  Instructions were placed in the AVS to stop Steroid and Areva in case she develops an infection and resume after infection resolves.  Recommendations regarding immunization was also placed in the AVS.  Polymyalgia rheumatica (HCC)-she had no increased muscular weakness and tenderness.  Temporal arteritis (HCC)-she had no temporal artery tenderness.  She is currently on prednisone 10 mg p.o. daily.  We discussed tapering prednisone by 1 mg every month until she reaches 7 mg p.o.  daily.  On prednisone therapy - prednisone 5 mg 2 tablets by mouth daily  Pain in left hip - She has severe end-stage osteoarthritis of the left hip.  She continues to have discomfort in her hip joint.  Primary osteoarthritis of both knees-she has chronic discomfort in her knee joints.  No synovitis was noted.  Spinal stenosis, thoracic-chronic pain  DDD (degenerative disc disease), lumbar-chronic pain  Chronic pain syndrome-she continues to have chronic pain and discomfort.  She takes Tylenol and Nucynta.  History of chronic kidney disease - Stage 3b CKD.  Followed by Dr. Theador Hawthorne.  Her GFR is lower than before which could be due to the use of Demadex.  I have advised her to discuss GFR with Dr. Theador Hawthorne.  History of hypertension-her blood pressure was mildly elevated.  Colonic ulcer - Evident on colonoscopy 09/22/2021. Followed by Dr. Laural Golden. Nonbleeding ulcer noted at splenic flexure.   History of diverticulitis  History of gastroesophageal reflux (GERD)  Vitamin D deficiency  Memory loss  Orders: No orders of the defined types were placed in this encounter.  Meds ordered this encounter  Medications   predniSONE (DELTASONE) 1 MG tablet    Sig: 4 tabs QD x 1 month (total 9 mg daily), then 3 tabs QD x 1 month (total 8 mg daily), then 2 tabs QD x 1 month (total 7 mg daily), then 1 tab QD x 1 month (total 6mg  daily). Take in combination with one 5mg  tablet.    Dispense:  300 tablet    Refill:  0      Follow-Up Instructions: Return in about 3 months (around 08/12/2021) for Rheumatoid arthritis.   Bo Merino, MD  Note - This record has been created using Editor, commissioning.  Chart creation errors have been sought, but may not always  have been located. Such creation errors do not reflect on  the standard of medical care.

## 2021-04-28 NOTE — Therapy (Signed)
[place], [place], [address] Phone: [phone]   Fax:  [phone]  Physical Therapy Treatment  Patient Details  Name: Nicole [name] MRN: [id number] Date of Birth: 12-04-1945 Referring Provider (PT): Ofilia Neas, PA-C   Encounter Date: 04/28/2021   PT End of Session - 04/28/21 1259     Visit Number 9    Number of Visits 18    Date for PT Re-Evaluation 06/14/21    Authorization Type Primary Medicare Secondary Tric[employer] (no auth, no VL)    Progress Note Due on Visit 16    PT Start Time 1135    PT Stop Time 1218    PT Time Calculation (min) 43 min    Equipment Utilized During Treatment Gait belt    Activity Tolerance Patient limited by fatigue;Patient tolerated treatment well    Behavior During Therapy [facility] for tasks assessed/performed             Past Medical History:  Diagnosis Date   AC (acromioclavicular) joint bone spurs    lt shoulder   Anemia    Arthritis    Carpal tunnel syndrome, bilateral    CHF (congestive heart failure) (HCC)    Collagen vascular disease ([place])    Gastroesophageal reflux    Headache    recent visit to ER @ [facility] for severe headache   Hypertension    Lumbar stenosis    Hx of ESIs by Dr. Nelva Bush   Polyarthralgia    Polymyalgia [facility])    Shingles    Spinal stenosis     Past Surgical History:  Procedure Laterality Date   BACK SURGERY  07/05/2018, 07/2018   x2    BIOPSY  09/22/2020   Procedure: BIOPSY;  Surgeon: Rogene Houston, MD;  [facility];  Service: Endoscopy;;   CHOLECYSTECTOMY     COLONOSCOPY  06/11/2012   Procedure: COLONOSCOPY;  Surgeon: Jamesetta So, MD;  [facility];  Service: Gastroenterology;  Laterality: N/A;   COLONOSCOPY WITH PROPOFOL N/A 09/22/2020   Procedure: COLONOSCOPY WITH PROPOFOL;  Surgeon: Rogene Houston, MD;  [facility];  Service: Endoscopy;  Laterality: N/A;  730   HYSTEROSCOPY WITH D & C N/A  02/25/2014   Procedure: DILATATION AND CURETTAGE /HYSTEROSCOPY;  Surgeon: Florian Buff, MD;  Location: [facility];  Service: Gynecology;  Laterality: N/A;   POLYPECTOMY N/A 02/25/2014   Procedure: POLYPECTOMY;  Surgeon: Florian Buff, MD;  Location: [facility];  Service: Gynecology;  Laterality: N/A;   RESECTION DISTAL CLAVICAL Right 03/26/2015   Procedure: OPEN DISTAL CLAVICAL RESECTION ;  Surgeon: Netta Cedars, MD;  Location: [place];  Service: Orthopedics;  Laterality: Right;    There were no vitals filed for this visit.   Subjective Assessment - 04/28/21 1146     Subjective pt states she was tired after leaving the doctors yesterday and that is why she cancelled her appt here.  STates she had increased Rt lateral thigh pain following using her compression pump this morning but states it was not because of the compression pump.  Pt also comes with a new referral for lymphedema from her kidney MD (last referred by primary) and discussed this with patient.    Currently in Pain? Yes    Pain Score 8     Pain Location Leg    Pain Orientation Right  [place] Adult PT Treatment/Exercise - 04/28/21 0001       Ambulation/Gait   Ambulation/Gait Yes    Ambulation Distance (Feet) 50 Feet    Assistive device Rolling walker    Gait Pattern Decreased stride length;Trunk flexed;Poor foot clearance - left;Poor foot clearance - right    Ambulation Surface Indoor;Level    Gait Comments 1 bout only      Lumbar Exercises: Seated   Other Seated Lumbar Exercises LAQ and hip flexion 10X each with cues      Knee/Hip Exercises: Standing   Hip Flexion 2 sets;5 reps    Hip Flexion Limitations in // bars with rest between sets                    PT Education - 04/28/21 1249     Education Details see below; importance of exercise for optimal results/improvement; discussed with CG Romie Minus) who states pt mostly likes to sit in her chair; encouraged pt and CG to  increase mobility, i.e. walk to bathroom/table, HEP 2X daily.    Person(s) Educated Patient;Caregiver(s)    Methods Explanation    Comprehension Verbalized understanding              PT Short Term Goals - 04/19/21 1045       PT SHORT TERM GOAL #1   Title Patient will be independent with HEP in order to improve functional outcomes.    Time 4    Period Weeks    Status New    Target Date 05/17/21      PT SHORT TERM GOAL #2   Title Patient will be able to ambulate x 50 ft with RW and RPE no greater than 7/10    Baseline 9/10 RPE with ambulation x 48 ft    Period Weeks    Status New    Target Date 05/17/21      PT SHORT TERM GOAL #3   Title Patient will improve standing tolerance to 45 sec while performing dynamic upper body movements (reaching overhead to retrive sticker tiles) to improve effiiciency and safety with ADL performance    Baseline 20 sec with unilateral support    Time 4    Period Weeks    Status New    Target Date 05/17/21               PT Long Term Goals - 04/19/21 1048       PT LONG TERM GOAL #1   Title Patient will improve standing tolerance to 60 sec while performing dynamic upper body movements (reaching overhead to retrive sticker tiles) to improve effiiciency and safety with ADL performance    Baseline 20 sec    Time 8    Period Weeks    Status New    Target Date 06/14/21      PT LONG TERM GOAL #2   Title Patient will be able to ambulate x 50 ft with RW and RPE not exceeding 5/10 to manifest improve ambulation tolerance    Baseline 48 ft with RPE 9/10    Time 8    Period Weeks    Status New    Target Date 06/14/21                   Plan - 04/28/21 1304     Clinical Impression Statement Discussed new referral for lymphedema with patient and both agreed she does not need to come back for this.  States she is pumping  daily and using her compression stockings daily.  Educated patient on when she may need to resume therapy for her  lymphedema and the difference between lymphatic fluid and normal edema.  Discussed her pain down her lateral Rt LE is likely coming from her back and that sitting aggravates lumbar most than other positions; encouraged walking more during the day.  Began with seated therex with cues needed to complete therex more slowly/controlled and increase hold times.  Also tends to substitute with body positions and use UE to assist LE's with therex.  Discussed with CG, Romie Minus (see above) and encouraged both patient the CG to increase activity at home.  Secretary [name]) made aware that pt does not need to resume lymphedema therapy and will contact referring MD.    Personal Factors and Comorbidities Age;Fitness;Behavior Pattern;Past/Current Experience;Comorbidity 3+;Social Background;Time since onset of injury/illness/exacerbation    Comorbidities CHF, RA, , polymyalgia, sedentary, obesity    Examination-Activity Limitations Locomotion Level;Transfers;Bathing;Bed Mobility;Stand;Stairs;Squat;Lift;Sit;Dressing;Hygiene/Grooming;Carry;Bend;Reach Overhead    Examination-Participation Restrictions Church;Meal Prep;Cleaning;Community Activity;Volunteer;Shop;Yard Work    Stability/Clinical Decision Making Unstable/Unpredictable    Rehab Potential Good    PT Frequency 2x / week    PT Duration 8 weeks    PT Treatment/Interventions Patient/family education;Manual lymph drainage;Compression bandaging;Moist Heat;Traction;Ultrasound;Gait training;Stair training;Functional mobility training;Therapeutic activities;Therapeutic exercise;Balance training;Neuromuscular re-education;Manual techniques;Passive range of motion;Energy conservation;Joint Manipulations;ADLs/Self Care Home Management;Aquatic Therapy;Electrical Stimulation    PT Next Visit Plan continue to progress functional strength and mobility.  F/U on walking at home and completion of HEP    PT Home Exercise Plan 5/17:  supine bridge, heelslides, lower trunk rotations; 5/20:  sitting tall, scapular retraction 5/24 marching, LAQ, hip add iso; 6/3:  ankle pumps, LAQ, sitting marching, hip ab/adduction, diaphragmic breathing, and lymphatic squeeze.; 6/22:  ab set, sitting tall.    Consulted and Agree with Plan of Care Patient             Patient will benefit from skilled therapeutic intervention in order to improve the following deficits and impairments:  Decreased endurance, Obesity, Pain, Increased edema, Abnormal gait, Decreased activity tolerance, Decreased balance, Decreased mobility, Decreased strength, Difficulty walking  Visit Diagnosis: Other abnormalities of gait and mobility  Muscle weakness (generalized)  Other symptoms and signs involving the musculoskeletal system  Low back pain, unspecified back pain laterality, unspecified chronicity, unspecified whether sciatica present     Problem List Patient Active Problem List   Diagnosis Date Noted   Diverticulitis 05/02/2018   Diverticulitis of colon 05/01/2018   Morbid obesity with BMI of 50.0-59.9, adult ([place]) 05/01/2018   Chronic diastolic HF (heart failure) ([place]) 05/01/2018   On prednisone therapy 05/31/2017   Positive anti-CCP test 05/31/2017   Rheumatoid factor positive 05/31/2017   Memory loss 04/04/2017   Polymyalgia rheumatica ([place]) 04/04/2017   High risk medication use 12/07/2016   DDD (degenerative disc disease), lumbar 09/27/2016   HTN (hypertension) 06/05/2016   Chronic pain syndrome 06/05/2016   Acute diverticulitis 06/02/2016   Diverticulitis large intestine 06/02/2016   Spinal stenosis of lumbosacral region 02/29/2016   Joint pain 02/29/2016   Endometrial polyp 02/06/2014   Postmenopausal bleeding 01/30/2014   GERD (gastroesophageal reflux disease) 09/29/2013   Spinal stenosis, thoracic 09/29/2013   Shingles 09/29/2013   Thoracic or lumbosacral neuritis or radiculitis, unspecified 05/04/2011   Abnormality of gait 05/04/2011   Muscle weakness (generalized) 05/04/2011    Bilateral primary osteoarthritis of knee 04/07/2009   KNEE PAIN 04/07/2009   Teena Irani, PTA/CLT [place], Nikkol Pai B 04/28/2021, 1:15 PM  [place], [place], [address] Phone: [phone]   Fax:  [phone]  Name: Nicole [name] MRN: [id number] Date of Birth: November 03, 1945

## 2021-04-29 ENCOUNTER — Encounter (HOSPITAL_COMMUNITY): Payer: TRICARE For Life (TFL) | Admitting: Physical Therapy

## 2021-04-29 ENCOUNTER — Ambulatory Visit (HOSPITAL_COMMUNITY): Payer: Medicare Other | Admitting: Physical Therapy

## 2021-05-02 ENCOUNTER — Encounter (HOSPITAL_COMMUNITY): Payer: Medicare Other | Admitting: Physical Therapy

## 2021-05-02 ENCOUNTER — Other Ambulatory Visit: Payer: Self-pay

## 2021-05-02 ENCOUNTER — Ambulatory Visit (HOSPITAL_COMMUNITY): Payer: Medicare Other | Admitting: Physical Therapy

## 2021-05-02 MED ORDER — PREDNISONE 5 MG PO TABS: 10.0000 mg | ORAL_TABLET | Freq: Every day | ORAL | 2 refills | Status: DC

## 2021-05-02 NOTE — Telephone Encounter (Signed)
Next Visit: 05/12/2021   Last Visit: 01/27/2021   Last Fill: 02/18/2021   Dx:  Rheumatoid arthritis involving multiple sites with positive rheumatoid factor    Current Dose per office note on 01/27/2021, prednisone 5 mg 2 tablets daily   Okay to refill prednisone?

## 2021-05-02 NOTE — Telephone Encounter (Signed)
Patient called requesting prescription refill of Prednisone 5 mg to be sent to [facility] in [place].  Patient states she is out of medication and is in a lot of pain in her arms, chest, legs, and feet.

## 2021-05-04 ENCOUNTER — Other Ambulatory Visit: Payer: Self-pay

## 2021-05-04 ENCOUNTER — Encounter (HOSPITAL_COMMUNITY): Payer: Medicare Other | Admitting: Physical Therapy

## 2021-05-04 ENCOUNTER — Encounter (HOSPITAL_COMMUNITY): Payer: Self-pay | Admitting: Physical Therapy

## 2021-05-04 ENCOUNTER — Ambulatory Visit (HOSPITAL_COMMUNITY): Payer: Medicare Other | Admitting: Physical Therapy

## 2021-05-04 DIAGNOSIS — M25551 Pain in right hip: Secondary | ICD-10-CM

## 2021-05-04 DIAGNOSIS — M25552 Pain in left hip: Secondary | ICD-10-CM

## 2021-05-04 DIAGNOSIS — R2689 Other abnormalities of gait and mobility: Secondary | ICD-10-CM

## 2021-05-04 DIAGNOSIS — R29898 Other symptoms and signs involving the musculoskeletal system: Secondary | ICD-10-CM | POA: Diagnosis not present

## 2021-05-04 DIAGNOSIS — M25562 Pain in left knee: Secondary | ICD-10-CM

## 2021-05-04 DIAGNOSIS — M6281 Muscle weakness (generalized): Secondary | ICD-10-CM | POA: Diagnosis not present

## 2021-05-04 DIAGNOSIS — M545 Low back pain, unspecified: Secondary | ICD-10-CM

## 2021-05-04 DIAGNOSIS — M25561 Pain in right knee: Secondary | ICD-10-CM

## 2021-05-04 NOTE — Therapy (Signed)
[place], [place], [address] Phone: [phone]   Fax:  [phone]  Physical Therapy Treatment  Patient Details  Name: Nicole [name] MRN: [id number] Date of Birth: 04/05/1946 Referring Provider (PT): Ofilia Neas, PA-C   Encounter Date: 05/04/2021   PT End of Session - 05/04/21 1454     Visit Number 12   corrected count due to prior error   Number of Visits 28   corrected count   Date for PT Re-Evaluation 06/14/21    Authorization Type Primary Medicare Secondary Tricare (no auth, no VL)    Progress Note Due on Visit 16    PT Start Time 1455    PT Stop Time 1533    PT Time Calculation (min) 38 min    Equipment Utilized During Treatment Gait belt    Activity Tolerance Patient limited by fatigue;Patient tolerated treatment well    Behavior During Therapy WFL for tasks assessed/performed             Past Medical History:  Diagnosis Date   AC (acromioclavicular) joint bone spurs    lt shoulder   Anemia    Arthritis    Carpal tunnel syndrome, bilateral    CHF (congestive heart failure) (HCC)    Collagen vascular disease ([place])    Gastroesophageal reflux    Headache    recent visit to ER @ [facility] for severe headache   Hypertension    Lumbar stenosis    Hx of ESIs by Dr. Nelva Bush   Polyarthralgia    Polymyalgia [facility])    Shingles    Spinal stenosis     Past Surgical History:  Procedure Laterality Date   BACK SURGERY  07/05/2018, 07/2018   x2    BIOPSY  09/22/2020   Procedure: BIOPSY;  Surgeon: Rogene Houston, MD;  [facility];  Service: Endoscopy;;   CHOLECYSTECTOMY     COLONOSCOPY  06/11/2012   Procedure: COLONOSCOPY;  Surgeon: Jamesetta So, MD;  [facility];  Service: Gastroenterology;  Laterality: N/A;   COLONOSCOPY WITH PROPOFOL N/A 09/22/2020   Procedure: COLONOSCOPY WITH PROPOFOL;  Surgeon: Rogene Houston, MD;  [facility];  Service: Endoscopy;   Laterality: N/A;  730   HYSTEROSCOPY WITH D & C N/A 02/25/2014   Procedure: DILATATION AND CURETTAGE /HYSTEROSCOPY;  Surgeon: Florian Buff, MD;  Location: [facility];  Service: Gynecology;  Laterality: N/A;   POLYPECTOMY N/A 02/25/2014   Procedure: POLYPECTOMY;  Surgeon: Florian Buff, MD;  Location: [facility];  Service: Gynecology;  Laterality: N/A;   RESECTION DISTAL CLAVICAL Right 03/26/2015   Procedure: OPEN DISTAL CLAVICAL RESECTION ;  Surgeon: Netta Cedars, MD;  Location: [place];  Service: Orthopedics;  Laterality: Right;    There were no vitals filed for this visit.   Subjective Assessment - 05/04/21 1455     Subjective Patient states she is feeling pretty good today.    Currently in Pain? Yes    Pain Score 6     Pain Location Leg    Pain Orientation Right    Pain Descriptors / Indicators Aching    Pain Type Chronic pain                               [facility] Adult PT Treatment/Exercise - 05/04/21 0001       Ambulation/Gait   Ambulation/Gait Yes  Ambulation Distance (Feet) 150 Feet    Assistive device Rolling walker    Gait Pattern Decreased stride length;Trunk flexed;Poor foot clearance - left;Poor foot clearance - right    Ambulation Surface Level;Indoor    Gait Comments 2 bouts      Lumbar Exercises: Standing   Row Both;10 reps    Theraband Level (Row) Level 2 (Red)    Row Limitations 3 sets      Lumbar Exercises: Seated   Other Seated Lumbar Exercises LAQ and hip flexion 10X each with cues    Other Seated Lumbar Exercises bicep curls 2x 10 1#      Knee/Hip Exercises: Standing   Hip Flexion 2 sets;5 reps    Hip Flexion Limitations in // bars with rest between sets                    PT Education - 05/04/21 1455     Education Details HEP    Person(s) Educated Patient    Methods Explanation    Comprehension Verbalized understanding              PT Short Term Goals - 04/19/21 1045       PT SHORT TERM GOAL #1   Title Patient will  be independent with HEP in order to improve functional outcomes.    Time 4    Period Weeks    Status New    Target Date 05/17/21      PT SHORT TERM GOAL #2   Title Patient will be able to ambulate x 50 ft with RW and RPE no greater than 7/10    Baseline 9/10 RPE with ambulation x 48 ft    Period Weeks    Status New    Target Date 05/17/21      PT SHORT TERM GOAL #3   Title Patient will improve standing tolerance to 45 sec while performing dynamic upper body movements (reaching overhead to retrive sticker tiles) to improve effiiciency and safety with ADL performance    Baseline 20 sec with unilateral support    Time 4    Period Weeks    Status New    Target Date 05/17/21               PT Long Term Goals - 04/19/21 1048       PT LONG TERM GOAL #1   Title Patient will improve standing tolerance to 60 sec while performing dynamic upper body movements (reaching overhead to retrive sticker tiles) to improve effiiciency and safety with ADL performance    Baseline 20 sec    Time 8    Period Weeks    Status New    Target Date 06/14/21      PT LONG TERM GOAL #2   Title Patient will be able to ambulate x 50 ft with RW and RPE not exceeding 5/10 to manifest improve ambulation tolerance    Baseline 48 ft with RPE 9/10    Time 8    Period Weeks    Status New    Target Date 06/14/21                   Plan - 05/04/21 1455     Clinical Impression Statement Patient able to ambulate 150 feet in 2 bouts with fatigue following limiting ability to continue. Added UE strengthening during rest breaks to improve activity tolerance and endurance. Patient fatigues quickly with standing and requires frequent rest breaks. Patient overall  limited by pain and impaired endurance. Patient educated on importance of increasing mobility at home. Patient will continue to benefit from skilled physical therapy in order to reduce impairment and improve function.    Personal Factors and  Comorbidities Age;Fitness;Behavior Pattern;Past/Current Experience;Comorbidity 3+;Social Background;Time since onset of injury/illness/exacerbation    Comorbidities CHF, RA, , polymyalgia, sedentary, obesity    Examination-Activity Limitations Locomotion Level;Transfers;Bathing;Bed Mobility;Stand;Stairs;Squat;Lift;Sit;Dressing;Hygiene/Grooming;Carry;Bend;Reach Overhead    Examination-Participation Restrictions Church;Meal Prep;Cleaning;Community Activity;Volunteer;Shop;Yard Work    Stability/Clinical Decision Making Unstable/Unpredictable    Rehab Potential Good    PT Frequency 2x / week    PT Duration 8 weeks    PT Treatment/Interventions Patient/family education;Manual lymph drainage;Compression bandaging;Moist Heat;Traction;Ultrasound;Gait training;Stair training;Functional mobility training;Therapeutic activities;Therapeutic exercise;Balance training;Neuromuscular re-education;Manual techniques;Passive range of motion;Energy conservation;Joint Manipulations;ADLs/Self Care Home Management;Aquatic Therapy;Electrical Stimulation    PT Next Visit Plan continue to progress functional strength and mobility.  F/U on walking at home and completion of HEP    PT Home Exercise Plan 5/17:  supine bridge, heelslides, lower trunk rotations; 5/20: sitting tall, scapular retraction 5/24 marching, LAQ, hip add iso; 6/3:  ankle pumps, LAQ, sitting marching, hip ab/adduction, diaphragmic breathing, and lymphatic squeeze.; 6/22:  ab set, sitting tall.    Consulted and Agree with Plan of Care Patient             Patient will benefit from skilled therapeutic intervention in order to improve the following deficits and impairments:  Decreased endurance, Obesity, Pain, Increased edema, Abnormal gait, Decreased activity tolerance, Decreased balance, Decreased mobility, Decreased strength, Difficulty walking  Visit Diagnosis: Other abnormalities of gait and mobility  Muscle weakness (generalized)  Other symptoms  and signs involving the musculoskeletal system  Low back pain, unspecified back pain laterality, unspecified chronicity, unspecified whether sciatica present  Bilateral hip pain  Pain in both knees, unspecified chronicity     Problem List Patient Active Problem List   Diagnosis Date Noted   Diverticulitis 05/02/2018   Diverticulitis of colon 05/01/2018   Morbid obesity with BMI of 50.0-59.9, adult ([place]) 05/01/2018   Chronic diastolic HF (heart failure) ([place]) 05/01/2018   On prednisone therapy 05/31/2017   Positive anti-CCP test 05/31/2017   Rheumatoid factor positive 05/31/2017   Memory loss 04/04/2017   Polymyalgia rheumatica ([place]) 04/04/2017   High risk medication use 12/07/2016   DDD (degenerative disc disease), lumbar 09/27/2016   HTN (hypertension) 06/05/2016   Chronic pain syndrome 06/05/2016   Acute diverticulitis 06/02/2016   Diverticulitis large intestine 06/02/2016   Spinal stenosis of lumbosacral region 02/29/2016   Joint pain 02/29/2016   Endometrial polyp 02/06/2014   Postmenopausal bleeding 01/30/2014   GERD (gastroesophageal reflux disease) 09/29/2013   Spinal stenosis, thoracic 09/29/2013   Shingles 09/29/2013   Thoracic or lumbosacral neuritis or radiculitis, unspecified 05/04/2011   Abnormality of gait 05/04/2011   Muscle weakness (generalized) 05/04/2011   Bilateral primary osteoarthritis of knee 04/07/2009   KNEE PAIN 04/07/2009    3:32 PM, 05/04/21 Mearl Latin PT, DPT Physical Therapist at [place], [place], [address] Phone: [phone]   Fax:  [phone]  Name: Latanga Nedrow MRN: [id number] Date of Birth: 08-17-1946

## 2021-05-05 DIAGNOSIS — G894 Chronic pain syndrome: Secondary | ICD-10-CM | POA: Diagnosis not present

## 2021-05-06 ENCOUNTER — Encounter (HOSPITAL_COMMUNITY): Payer: Self-pay | Admitting: Physical Therapy

## 2021-05-06 ENCOUNTER — Ambulatory Visit (HOSPITAL_COMMUNITY): Payer: Medicare Other | Admitting: Physical Therapy

## 2021-05-06 ENCOUNTER — Other Ambulatory Visit: Payer: Self-pay

## 2021-05-06 ENCOUNTER — Encounter (HOSPITAL_COMMUNITY): Payer: Medicare Other | Admitting: Physical Therapy

## 2021-05-06 DIAGNOSIS — R29898 Other symptoms and signs involving the musculoskeletal system: Secondary | ICD-10-CM

## 2021-05-06 DIAGNOSIS — M6281 Muscle weakness (generalized): Secondary | ICD-10-CM

## 2021-05-06 DIAGNOSIS — M545 Low back pain, unspecified: Secondary | ICD-10-CM | POA: Diagnosis not present

## 2021-05-06 DIAGNOSIS — M25562 Pain in left knee: Secondary | ICD-10-CM

## 2021-05-06 DIAGNOSIS — M25552 Pain in left hip: Secondary | ICD-10-CM | POA: Diagnosis not present

## 2021-05-06 DIAGNOSIS — M25551 Pain in right hip: Secondary | ICD-10-CM

## 2021-05-06 DIAGNOSIS — R2689 Other abnormalities of gait and mobility: Secondary | ICD-10-CM | POA: Diagnosis not present

## 2021-05-06 DIAGNOSIS — M25561 Pain in right knee: Secondary | ICD-10-CM

## 2021-05-06 NOTE — Therapy (Signed)
[place], [place], [address] Phone: [phone]   Fax:  [phone]  Physical Therapy Treatment  Patient Details  Name: Nicole [name] MRN: [id number] Date of Birth: Jun 01, 1946 Referring Provider (PT): Ofilia Neas, PA-C   Encounter Date: 05/06/2021   PT End of Session - 05/06/21 1451     Visit Number 13    Number of Visits 28   corrected count   Date for PT Re-Evaluation 06/14/21    Authorization Type Primary Medicare Secondary Tricare (no auth, no VL)    Progress Note Due on Visit 16    PT Start Time 1452    PT Stop Time 1533    PT Time Calculation (min) 41 min    Equipment Utilized During Treatment Gait belt    Activity Tolerance Patient limited by fatigue;Patient tolerated treatment well    Behavior During Therapy WFL for tasks assessed/performed             Past Medical History:  Diagnosis Date   AC (acromioclavicular) joint bone spurs    lt shoulder   Anemia    Arthritis    Carpal tunnel syndrome, bilateral    CHF (congestive heart failure) (HCC)    Collagen vascular disease ([place])    Gastroesophageal reflux    Headache    recent visit to ER @ [facility] for severe headache   Hypertension    Lumbar stenosis    Hx of ESIs by Dr. Nelva Bush   Polyarthralgia    Polymyalgia [facility])    Shingles    Spinal stenosis     Past Surgical History:  Procedure Laterality Date   BACK SURGERY  07/05/2018, 07/2018   x2    BIOPSY  09/22/2020   Procedure: BIOPSY;  Surgeon: Rogene Houston, MD;  [facility];  Service: Endoscopy;;   CHOLECYSTECTOMY     COLONOSCOPY  06/11/2012   Procedure: COLONOSCOPY;  Surgeon: Jamesetta So, MD;  [facility];  Service: Gastroenterology;  Laterality: N/A;   COLONOSCOPY WITH PROPOFOL N/A 09/22/2020   Procedure: COLONOSCOPY WITH PROPOFOL;  Surgeon: Rogene Houston, MD;  [facility];  Service: Endoscopy;  Laterality: N/A;  730   HYSTEROSCOPY  WITH D & C N/A 02/25/2014   Procedure: DILATATION AND CURETTAGE /HYSTEROSCOPY;  Surgeon: Florian Buff, MD;  [facility];  Service: Gynecology;  Laterality: N/A;   POLYPECTOMY N/A 02/25/2014   Procedure: POLYPECTOMY;  Surgeon: Florian Buff, MD;  Location: [facility];  Service: Gynecology;  Laterality: N/A;   RESECTION DISTAL CLAVICAL Right 03/26/2015   Procedure: OPEN DISTAL CLAVICAL RESECTION ;  Surgeon: Netta Cedars, MD;  Location: [place];  Service: Orthopedics;  Laterality: Right;    There were no vitals filed for this visit.   Subjective Assessment - 05/06/21 1452     Subjective Pt has no complaints    Pertinent History Patient is being seen in  physical therapy for  c/o back pain, knee pain and hip pain. Lumbar stenosis with 2 back surgeries.    Limitations House hold activities;Lifting;Standing;Walking    How long can you walk comfortably? 50 ft was 10    Patient Stated Goals walk and drive car and lose weight                               [facility] Adult PT Treatment/Exercise - 05/06/21 0001  Ambulation/Gait   Ambulation/Gait Yes    Ambulation Distance (Feet) 170 Feet    Assistive device Rolling walker    Gait Comments with 2 rest      Lumbar Exercises: Seated   Long Arc Quad on Chair Strengthening;Both;10 reps    LAQ on Chair Weights (lbs) 5    Sit to Stand 10 reps    Other Seated Lumbar Exercises ab set with tall sitting and scapular retraction x10    Other Seated Lumbar Exercises t band postural exercises with green band x 10 each of scapular retraction, row and extension                      PT Short Term Goals - 04/19/21 1045       PT SHORT TERM GOAL #1   Title Patient will be independent with HEP in order to improve functional outcomes.    Time 4    Period Weeks    Status New    Target Date 05/17/21      PT SHORT TERM GOAL #2   Title Patient will be able to ambulate x 50 ft with RW and RPE no greater than 7/10    Baseline 9/10  RPE with ambulation x 48 ft    Period Weeks    Status New    Target Date 05/17/21      PT SHORT TERM GOAL #3   Title Patient will improve standing tolerance to 45 sec while performing dynamic upper body movements (reaching overhead to retrive sticker tiles) to improve effiiciency and safety with ADL performance    Baseline 20 sec with unilateral support    Time 4    Period Weeks    Status New    Target Date 05/17/21               PT Long Term Goals - 04/19/21 1048       PT LONG TERM GOAL #1   Title Patient will improve standing tolerance to 60 sec while performing dynamic upper body movements (reaching overhead to retrive sticker tiles) to improve effiiciency and safety with ADL performance    Baseline 20 sec    Time 8    Period Weeks    Status New    Target Date 06/14/21      PT LONG TERM GOAL #2   Title Patient will be able to ambulate x 50 ft with RW and RPE not exceeding 5/10 to manifest improve ambulation tolerance    Baseline 48 ft with RPE 9/10    Time 8    Period Weeks    Status New    Target Date 06/14/21                   Plan - 05/06/21 1524     Clinical Impression Statement Pt increased ambulation to 170 ft with 2 rest breaks.  Pt continues to be fatigued after each activity performed.  PT is doing HEP as able.    Personal Factors and Comorbidities Age;Fitness;Behavior Pattern;Past/Current Experience;Comorbidity 3+;Social Background;Time since onset of injury/illness/exacerbation    Comorbidities CHF, RA, , polymyalgia, sedentary, obesity    Examination-Activity Limitations Locomotion Level;Transfers;Bathing;Bed Mobility;Stand;Stairs;Squat;Lift;Sit;Dressing;Hygiene/Grooming;Carry;Bend;Reach Overhead    Examination-Participation Restrictions Church;Meal Prep;Cleaning;Community Activity;Volunteer;Shop;Yard Work    Stability/Clinical Decision Making Unstable/Unpredictable    Rehab Potential Good    PT Frequency 2x / week    PT Duration 8 weeks     PT Treatment/Interventions Patient/family education;Manual lymph drainage;Compression bandaging;Moist Heat;Traction;Ultrasound;Gait training;Stair  training;Functional mobility training;Therapeutic activities;Therapeutic exercise;Balance training;Neuromuscular re-education;Manual techniques;Passive range of motion;Energy conservation;Joint Manipulations;ADLs/Self Care Home Management;Aquatic Therapy;Electrical Stimulation    PT Next Visit Plan continue to progress functional strength and mobility.  F/U on walking at home and completion of HEP    PT Home Exercise Plan 5/17:  supine bridge, heelslides, lower trunk rotations; 5/20: sitting tall, scapular retraction 5/24 marching, LAQ, hip add iso; 6/3:  ankle pumps, LAQ, sitting marching, hip ab/adduction, diaphragmic breathing, and lymphatic squeeze.; 6/22:  ab set, sitting tall.    Consulted and Agree with Plan of Care Patient             Patient will benefit from skilled therapeutic intervention in order to improve the following deficits and impairments:  Decreased endurance, Obesity, Pain, Increased edema, Abnormal gait, Decreased activity tolerance, Decreased balance, Decreased mobility, Decreased strength, Difficulty walking  Visit Diagnosis: Other abnormalities of gait and mobility  Muscle weakness (generalized)  Other symptoms and signs involving the musculoskeletal system  Low back pain, unspecified back pain laterality, unspecified chronicity, unspecified whether sciatica present  Bilateral hip pain  Pain in both knees, unspecified chronicity     Problem List Patient Active Problem List   Diagnosis Date Noted   Diverticulitis 05/02/2018   Diverticulitis of colon 05/01/2018   Morbid obesity with BMI of 50.0-59.9, adult ([place]) 05/01/2018   Chronic diastolic HF (heart failure) ([place]) 05/01/2018   On prednisone therapy 05/31/2017   Positive anti-CCP test 05/31/2017   Rheumatoid factor positive 05/31/2017   Memory loss  04/04/2017   Polymyalgia rheumatica ([place]) 04/04/2017   High risk medication use 12/07/2016   DDD (degenerative disc disease), lumbar 09/27/2016   HTN (hypertension) 06/05/2016   Chronic pain syndrome 06/05/2016   Acute diverticulitis 06/02/2016   Diverticulitis large intestine 06/02/2016   Spinal stenosis of lumbosacral region 02/29/2016   Joint pain 02/29/2016   Endometrial polyp 02/06/2014   Postmenopausal bleeding 01/30/2014   GERD (gastroesophageal reflux disease) 09/29/2013   Spinal stenosis, thoracic 09/29/2013   Shingles 09/29/2013   Thoracic or lumbosacral neuritis or radiculitis, unspecified 05/04/2011   Abnormality of gait 05/04/2011   Muscle weakness (generalized) 05/04/2011   Bilateral primary osteoarthritis of knee 04/07/2009   KNEE PAIN 04/07/2009   Rayetta Humphrey, PT CLT [phone]  05/06/2021, 3:30 PM  [place], [place], [address] Phone: [phone]   Fax:  [phone]  Name: Nicole [name] MRN: [id number] Date of Birth: 08-Feb-1946

## 2021-05-09 ENCOUNTER — Other Ambulatory Visit: Payer: Self-pay

## 2021-05-09 ENCOUNTER — Encounter (HOSPITAL_COMMUNITY): Payer: Medicare Other | Admitting: Physical Therapy

## 2021-05-09 ENCOUNTER — Ambulatory Visit (HOSPITAL_COMMUNITY): Payer: Medicare Other | Admitting: Physical Therapy

## 2021-05-09 ENCOUNTER — Telehealth (HOSPITAL_COMMUNITY): Payer: Self-pay | Admitting: Physical Therapy

## 2021-05-09 ENCOUNTER — Other Ambulatory Visit: Payer: Self-pay | Admitting: Physician Assistant

## 2021-05-09 DIAGNOSIS — Z79899 Other long term (current) drug therapy: Secondary | ICD-10-CM | POA: Diagnosis not present

## 2021-05-09 DIAGNOSIS — M353 Polymyalgia rheumatica: Secondary | ICD-10-CM

## 2021-05-09 DIAGNOSIS — M316 Other giant cell arteritis: Secondary | ICD-10-CM

## 2021-05-09 DIAGNOSIS — M0579 Rheumatoid arthritis with rheumatoid factor of multiple sites without organ or systems involvement: Secondary | ICD-10-CM

## 2021-05-09 LAB — CBC WITH DIFFERENTIAL/PLATELET
Absolute Monocytes: 837 cells/uL (ref 200–950)
Basophils Absolute: 54 cells/uL (ref 0–200)
Basophils Relative: 0.6 %
Eosinophils Absolute: 126 cells/uL (ref 15–500)
Eosinophils Relative: 1.4 %
HCT: 38.3 % (ref 35.0–45.0)
Hemoglobin: 12.8 g/dL (ref 11.7–15.5)
Lymphs Abs: 1872 cells/uL (ref 850–3900)
MCH: 32.1 pg (ref 27.0–33.0)
MCHC: 33.4 g/dL (ref 32.0–36.0)
MCV: 96 fL (ref 80.0–100.0)
MPV: 9.3 fL (ref 7.5–12.5)
Monocytes Relative: 9.3 %
Neutro Abs: 6111 cells/uL (ref 1500–7800)
Neutrophils Relative %: 67.9 %
Platelets: 283 10*3/uL (ref 140–400)
RBC: 3.99 10*6/uL (ref 3.80–5.10)
RDW: 14.7 % (ref 11.0–15.0)
Total Lymphocyte: 20.8 %
WBC: 9 10*3/uL (ref 3.8–10.8)

## 2021-05-09 LAB — COMPLETE METABOLIC PANEL WITH GFR
AG Ratio: 1.7 (calc) (ref 1.0–2.5)
ALT: 25 U/L (ref 6–29)
AST: 22 U/L (ref 10–35)
Albumin: 4 g/dL (ref 3.6–5.1)
Alkaline phosphatase (APISO): 66 U/L (ref 37–153)
BUN/Creatinine Ratio: 28 (calc) — ABNORMAL HIGH (ref 6–22)
BUN: 45 mg/dL — ABNORMAL HIGH (ref 7–25)
CO2: 33 mmol/L — ABNORMAL HIGH (ref 20–32)
Calcium: 9.5 mg/dL (ref 8.6–10.4)
Chloride: 101 mmol/L (ref 98–110)
Creat: 1.6 mg/dL — ABNORMAL HIGH (ref 0.60–1.00)
Globulin: 2.4 g/dL (calc) (ref 1.9–3.7)
Glucose, Bld: 87 mg/dL (ref 65–99)
Potassium: 3.6 mmol/L (ref 3.5–5.3)
Sodium: 143 mmol/L (ref 135–146)
Total Bilirubin: 0.4 mg/dL (ref 0.2–1.2)
Total Protein: 6.4 g/dL (ref 6.1–8.1)
eGFR: 34 mL/min/{1.73_m2} — ABNORMAL LOW (ref >=60)

## 2021-05-09 MED ORDER — KEVZARA 200 MG/1.14ML ~~LOC~~ SOAJ: 200.0000 mg | SUBCUTANEOUS | 0 refills | Status: DC

## 2021-05-09 NOTE — Telephone Encounter (Signed)
Pt did not show for appointment.  Per Network engineer, called 15 minutes prior to appt time to ask what time her appt was and then called back 15 minutes past time and resceduled appt for tomorrow.  Teena Irani, PTA/CLT [phone]

## 2021-05-09 NOTE — Telephone Encounter (Signed)
Patient states she called [facility] to request a refill of Kevzara.  Patient was told they haven't received prescription from Dr. Estanislado Pandy.  Patient states she is due to take her injection tomorrow, 05/10/21.  Patient requested a return call.

## 2021-05-09 NOTE — Telephone Encounter (Signed)
Patient is coming to the office to pick up a sample. Patient will also update her labs.    Next Visit: 05/12/2021  Last Visit: 01/27/2021  Last Fill: 02/23/2021  LM:[id number] arthritis involving multiple sites with positive rheumatoid factor  Current Dose per office note 01/27/2021: Kevzara 200 mg sq injections every 14 days  Labs: 01/27/2021 Labs are stable.  Creatinine remains elevated and GFR is low-44.    TB Gold: 09/09/2020 Neg   Patient advised she is due to update labs.   Okay to refill Darcus Pester?

## 2021-05-10 ENCOUNTER — Other Ambulatory Visit: Payer: Self-pay

## 2021-05-10 ENCOUNTER — Encounter (HOSPITAL_COMMUNITY): Payer: Self-pay

## 2021-05-10 ENCOUNTER — Ambulatory Visit (HOSPITAL_COMMUNITY): Payer: Medicare Other

## 2021-05-10 DIAGNOSIS — M545 Low back pain, unspecified: Secondary | ICD-10-CM | POA: Diagnosis not present

## 2021-05-10 DIAGNOSIS — R2689 Other abnormalities of gait and mobility: Secondary | ICD-10-CM

## 2021-05-10 DIAGNOSIS — M6281 Muscle weakness (generalized): Secondary | ICD-10-CM

## 2021-05-10 DIAGNOSIS — M25551 Pain in right hip: Secondary | ICD-10-CM | POA: Diagnosis not present

## 2021-05-10 DIAGNOSIS — R29898 Other symptoms and signs involving the musculoskeletal system: Secondary | ICD-10-CM | POA: Diagnosis not present

## 2021-05-10 DIAGNOSIS — M25552 Pain in left hip: Secondary | ICD-10-CM | POA: Diagnosis not present

## 2021-05-10 DIAGNOSIS — M25562 Pain in left knee: Secondary | ICD-10-CM

## 2021-05-10 DIAGNOSIS — M25561 Pain in right knee: Secondary | ICD-10-CM

## 2021-05-10 NOTE — Therapy (Signed)
[place], [place], [address] Phone: [phone]   Fax:  [phone]  Physical Therapy Treatment  Patient Details  Name: Nicole [name] MRN: [id number] Date of Birth: Nov 18, 1945 Referring Provider (PT): Ofilia Neas, PA-C   Encounter Date: 05/10/2021   PT End of Session - 05/10/21 1212     Visit Number 14    Number of Visits 28    Date for PT Re-Evaluation 06/14/21    Authorization Type Primary Medicare Secondary Tricare (no auth, no VL)    Progress Note Due on Visit 16    PT Start Time 1134    PT Stop Time 1212    PT Time Calculation (min) 38 min    Equipment Utilized During Treatment Gait belt    Activity Tolerance Patient limited by fatigue;Patient tolerated treatment well    Behavior During Therapy WFL for tasks assessed/performed             Past Medical History:  Diagnosis Date   AC (acromioclavicular) joint bone spurs    lt shoulder   Anemia    Arthritis    Carpal tunnel syndrome, bilateral    CHF (congestive heart failure) (HCC)    Collagen vascular disease ([place])    Gastroesophageal reflux    Headache    recent visit to ER @ [facility] for severe headache   Hypertension    Lumbar stenosis    Hx of ESIs by Dr. Nelva Bush   Polyarthralgia    Polymyalgia [facility])    Shingles    Spinal stenosis     Past Surgical History:  Procedure Laterality Date   BACK SURGERY  07/05/2018, 07/2018   x2    BIOPSY  09/22/2020   Procedure: BIOPSY;  Surgeon: Rogene Houston, MD;  [facility];  Service: Endoscopy;;   CHOLECYSTECTOMY     COLONOSCOPY  06/11/2012   Procedure: COLONOSCOPY;  Surgeon: Jamesetta So, MD;  [facility];  Service: Gastroenterology;  Laterality: N/A;   COLONOSCOPY WITH PROPOFOL N/A 09/22/2020   Procedure: COLONOSCOPY WITH PROPOFOL;  Surgeon: Rogene Houston, MD;  [facility];  Service: Endoscopy;  Laterality: N/A;  730   HYSTEROSCOPY WITH D & C N/A  02/25/2014   Procedure: DILATATION AND CURETTAGE /HYSTEROSCOPY;  Surgeon: Florian Buff, MD;  [facility];  Service: Gynecology;  Laterality: N/A;   POLYPECTOMY N/A 02/25/2014   Procedure: POLYPECTOMY;  Surgeon: Florian Buff, MD;  [facility];  Service: Gynecology;  Laterality: N/A;   RESECTION DISTAL CLAVICAL Right 03/26/2015   Procedure: OPEN DISTAL CLAVICAL RESECTION ;  Surgeon: Netta Cedars, MD;  Location: [place];  Service: Orthopedics;  Laterality: Right;    There were no vitals filed for this visit.   Subjective Assessment - 05/10/21 1200     Subjective Reports she has not been walking much at home due to pain.  Pt reports increased challenge yesterday with increased difficulty to stand from chair.  Feels she has increased pain with rain.    Pertinent History Patient is being seen in  physical therapy for  c/o back pain, knee pain and hip pain. Lumbar stenosis with 2 back surgeries.    Patient Stated Goals walk and drive car and lose weight    Currently in Pain? Yes    Pain Score 6     Pain Location Leg    Pain Orientation Right    Pain Descriptors / Indicators Aching;Sore  Pain Type Chronic pain    Pain Onset More than a month ago    Pain Frequency Intermittent    Aggravating Factors  walking    Pain Relieving Factors not sure    Effect of Pain on Daily Activities limits    Pain Score 7    Pain Location Back    Pain Orientation Lower;Right   increased pain following gait to Rt groin   Pain Descriptors / Indicators Aching;Dull    Pain Type Chronic pain    Pain Radiating Towards radiated around right buttocks to groin    Pain Onset More than a month ago    Pain Frequency Intermittent    Aggravating Factors  gait                               [facility] Adult PT Treatment/Exercise - 05/10/21 0001       Ambulation/Gait   Ambulation/Gait Yes    Ambulation Distance (Feet) 170 Feet    Assistive device Rolling walker    Gait Comments with 3 rest       Lumbar Exercises: Standing   Row Both;10 reps    Theraband Level (Row) Level 2 (Red)    Row Limitations 2 sets    Other Standing Lumbar Exercises toe tapping 5x alternating 2in step height    Other Standing Lumbar Exercises standing tolerance x 1 min prior fatigue      Lumbar Exercises: Seated   Other Seated Lumbar Exercises ab set with tall sitting and scapular retraction x10; glut sets    Other Seated Lumbar Exercises t band postural exercises with green band x 10 each of scapular retraction, row and extension                      PT Short Term Goals - 04/19/21 1045       PT SHORT TERM GOAL #1   Title Patient will be independent with HEP in order to improve functional outcomes.    Time 4    Period Weeks    Status New    Target Date 05/17/21      PT SHORT TERM GOAL #2   Title Patient will be able to ambulate x 50 ft with RW and RPE no greater than 7/10    Baseline 9/10 RPE with ambulation x 48 ft    Period Weeks    Status New    Target Date 05/17/21      PT SHORT TERM GOAL #3   Title Patient will improve standing tolerance to 45 sec while performing dynamic upper body movements (reaching overhead to retrive sticker tiles) to improve effiiciency and safety with ADL performance    Baseline 20 sec with unilateral support    Time 4    Period Weeks    Status New    Target Date 05/17/21               PT Long Term Goals - 04/19/21 1048       PT LONG TERM GOAL #1   Title Patient will improve standing tolerance to 60 sec while performing dynamic upper body movements (reaching overhead to retrive sticker tiles) to improve effiiciency and safety with ADL performance    Baseline 20 sec    Time 8    Period Weeks    Status New    Target Date 06/14/21      PT LONG TERM GOAL #  2   Title Patient will be able to ambulate x 50 ft with RW and RPE not exceeding 5/10 to manifest improve ambulation tolerance    Baseline 48 ft with RPE 9/10    Time 8    Period Weeks     Status New    Target Date 06/14/21                   Plan - 05/10/21 1311     Clinical Impression Statement Able to ambulate 118ft with RW with 3 rest breaks requires.  Increaded cadence though required increased recovery time due to SOB and fatigue.  Pt educated on energy conservation techniques to improve activity tolerance, verbalized understanding.    Personal Factors and Comorbidities Age;Fitness;Behavior Pattern;Past/Current Experience;Comorbidity 3+;Social Background;Time since onset of injury/illness/exacerbation    Comorbidities CHF, RA, , polymyalgia, sedentary, obesity    Examination-Activity Limitations Locomotion Level;Transfers;Bathing;Bed Mobility;Stand;Stairs;Squat;Lift;Sit;Dressing;Hygiene/Grooming;Carry;Bend;Reach Overhead    Examination-Participation Restrictions Church;Meal Prep;Cleaning;Community Activity;Volunteer;Shop;Yard Work    Stability/Clinical Decision Making Unstable/Unpredictable    Clinical Decision Making Moderate    Rehab Potential Good    PT Frequency 2x / week    PT Duration 8 weeks    PT Treatment/Interventions Patient/family education;Manual lymph drainage;Compression bandaging;Moist Heat;Traction;Ultrasound;Gait training;Stair training;Functional mobility training;Therapeutic activities;Therapeutic exercise;Balance training;Neuromuscular re-education;Manual techniques;Passive range of motion;Energy conservation;Joint Manipulations;ADLs/Self Care Home Management;Aquatic Therapy;Electrical Stimulation    PT Next Visit Plan continue to progress functional strength and mobility.  F/U on walking at home and completion of HEP    PT Home Exercise Plan 5/17:  supine bridge, heelslides, lower trunk rotations; 5/20: sitting tall, scapular retraction 5/24 marching, LAQ, hip add iso; 6/3:  ankle pumps, LAQ, sitting marching, hip ab/adduction, diaphragmic breathing, and lymphatic squeeze.; 6/22:  ab set, sitting tall.    Consulted and Agree with Plan of Care  Patient             Patient will benefit from skilled therapeutic intervention in order to improve the following deficits and impairments:  Decreased endurance, Obesity, Pain, Increased edema, Abnormal gait, Decreased activity tolerance, Decreased balance, Decreased mobility, Decreased strength, Difficulty walking  Visit Diagnosis: Other abnormalities of gait and mobility  Other symptoms and signs involving the musculoskeletal system  Muscle weakness (generalized)  Bilateral hip pain  Low back pain, unspecified back pain laterality, unspecified chronicity, unspecified whether sciatica present  Pain in both knees, unspecified chronicity     Problem List Patient Active Problem List   Diagnosis Date Noted   Diverticulitis 05/02/2018   Diverticulitis of colon 05/01/2018   Morbid obesity with BMI of 50.0-59.9, adult ([place]) 05/01/2018   Chronic diastolic HF (heart failure) ([place]) 05/01/2018   On prednisone therapy 05/31/2017   Positive anti-CCP test 05/31/2017   Rheumatoid factor positive 05/31/2017   Memory loss 04/04/2017   Polymyalgia rheumatica ([place]) 04/04/2017   High risk medication use 12/07/2016   DDD (degenerative disc disease), lumbar 09/27/2016   HTN (hypertension) 06/05/2016   Chronic pain syndrome 06/05/2016   Acute diverticulitis 06/02/2016   Diverticulitis large intestine 06/02/2016   Spinal stenosis of lumbosacral region 02/29/2016   Joint pain 02/29/2016   Endometrial polyp 02/06/2014   Postmenopausal bleeding 01/30/2014   GERD (gastroesophageal reflux disease) 09/29/2013   Spinal stenosis, thoracic 09/29/2013   Shingles 09/29/2013   Thoracic or lumbosacral neuritis or radiculitis, unspecified 05/04/2011   Abnormality of gait 05/04/2011   Muscle weakness (generalized) 05/04/2011   Bilateral primary osteoarthritis of knee 04/07/2009   KNEE PAIN 04/07/2009   Ihor Austin, LPTA/CLT; Delana Meyer  [phone]  Aldona Lento 05/10/2021, 1:29 PM  [place] [address] [place], [place], [address] Phone: [phone]   Fax:  [phone]  Name: Nicole [name] MRN: [id number] Date of Birth: [birth year]

## 2021-05-10 NOTE — Telephone Encounter (Signed)
Medication Samples have been provided to the patient.  Drug name: Darcus Pester       Strength: 200 mg/1.14 mL        Qty: 1 LOT: [id number]  Exp.Date: 01/20/2022  Dosing instructions: Inject 200 mg into skin every 14 days.

## 2021-05-11 ENCOUNTER — Encounter (HOSPITAL_COMMUNITY): Payer: Medicare Other | Admitting: Physical Therapy

## 2021-05-12 ENCOUNTER — Telehealth: Payer: Self-pay

## 2021-05-12 ENCOUNTER — Other Ambulatory Visit: Payer: Self-pay

## 2021-05-12 ENCOUNTER — Ambulatory Visit (INDEPENDENT_AMBULATORY_CARE_PROVIDER_SITE_OTHER): Payer: Medicare Other | Admitting: Rheumatology

## 2021-05-12 ENCOUNTER — Encounter: Payer: Self-pay | Admitting: Rheumatology

## 2021-05-12 VITALS — BP 139/89 | HR 96 | Ht 60.0 in | Wt 251.8 lb

## 2021-05-12 DIAGNOSIS — E559 Vitamin D deficiency, unspecified: Secondary | ICD-10-CM

## 2021-05-12 DIAGNOSIS — Z7952 Long term (current) use of systemic steroids: Secondary | ICD-10-CM

## 2021-05-12 DIAGNOSIS — M25552 Pain in left hip: Secondary | ICD-10-CM

## 2021-05-12 DIAGNOSIS — G894 Chronic pain syndrome: Secondary | ICD-10-CM

## 2021-05-12 DIAGNOSIS — M170 Bilateral primary osteoarthritis of knee: Secondary | ICD-10-CM

## 2021-05-12 DIAGNOSIS — M316 Other giant cell arteritis: Secondary | ICD-10-CM

## 2021-05-12 DIAGNOSIS — Z87448 Personal history of other diseases of urinary system: Secondary | ICD-10-CM | POA: Diagnosis not present

## 2021-05-12 DIAGNOSIS — Z79899 Other long term (current) drug therapy: Secondary | ICD-10-CM | POA: Diagnosis not present

## 2021-05-12 DIAGNOSIS — M353 Polymyalgia rheumatica: Secondary | ICD-10-CM

## 2021-05-12 DIAGNOSIS — M4804 Spinal stenosis, thoracic region: Secondary | ICD-10-CM

## 2021-05-12 DIAGNOSIS — M5136 Other intervertebral disc degeneration, lumbar region: Secondary | ICD-10-CM | POA: Diagnosis not present

## 2021-05-12 DIAGNOSIS — Z8719 Personal history of other diseases of the digestive system: Secondary | ICD-10-CM

## 2021-05-12 DIAGNOSIS — M0579 Rheumatoid arthritis with rheumatoid factor of multiple sites without organ or systems involvement: Secondary | ICD-10-CM

## 2021-05-12 DIAGNOSIS — R413 Other amnesia: Secondary | ICD-10-CM

## 2021-05-12 DIAGNOSIS — Z8679 Personal history of other diseases of the circulatory system: Secondary | ICD-10-CM | POA: Diagnosis not present

## 2021-05-12 DIAGNOSIS — K633 Ulcer of intestine: Secondary | ICD-10-CM

## 2021-05-12 MED ORDER — PREDNISONE 1 MG PO TABS: ORAL_TABLET | ORAL | 0 refills | Status: DC

## 2021-05-12 NOTE — Telephone Encounter (Signed)
Patient called stating her therapist wanted a copy of the x-ray she had of her knee.  Patient states can't remember if she had the x-ray at our office.  Patient requested a return call.

## 2021-05-12 NOTE — Telephone Encounter (Signed)
Patient advised we do not have a knee x-ray on file for her. Patient expressed understanding.

## 2021-05-12 NOTE — Patient Instructions (Addendum)
Please reduce dose of prednisone to 9 mg by mouth daily for 1 month then 8 mg by mouth daily for 1 month then stay on 7 mg daily    Standing Labs We placed an order today for your standing lab work.   Please have your standing labs drawn in October and every 3 months  If possible, please have your labs drawn 2 weeks prior to your appointment so that the provider can discuss your results at your appointment.  Please note that you may see your imaging and lab results in [place] before we have reviewed them. We may be awaiting multiple results to interpret others before contacting you. Please allow our office up to 72 hours to thoroughly review all of the results before contacting the office for clarification of your results.  We have open lab daily: Monday through Thursday from 1:30-4:30 PM and Friday from 1:30-4:00 PM at the office of Dr. Bo Merino, [facility].   Please be advised, all patients with office appointments requiring lab work will take precedent over walk-in lab work.  If possible, please come for your lab work on Monday and Friday afternoons, as you may experience shorter wait times. The office is located at [address], [place], [place], [place] [address] No appointment is necessary.   Labs are drawn by [facility]. Please bring your co-pay at the time of your lab draw.  You may receive a bill from [facility] for your lab work.  If you wish to have your labs drawn at another location, please call the office 24 hours in advance to send orders.  If you have any questions regarding directions or hours of operation,  please call [phone].   As a reminder, please drink plenty of water prior to coming for your lab work. Thanks!   Vaccines You are taking a medication(s) that can suppress your immune system.  The following immunizations are recommended: Flu annually Covid-19  Td/Tdap (tetanus, diphtheria, pertussis) every 10 years Pneumonia (Prevnar 15  then Pneumovax 23 at least 1 year apart.  Alternatively, can take Prevnar 20 without needing additional dose) Shingrix (after age 55): 2 doses from 4 weeks to 6 months apart  Please check with your PCP to make sure you are up to date.   If you test POSITIVE for COVID19 and have MILD to MODERATE symptoms: First, call your PCP if you would like to receive COVID19 treatment AND Hold your medications during the infection and for at least 1 week after your symptoms have resolved: Injectable medication (Benlysta, Cimzia, Cosentyx, Enbrel, Humira, Orencia, Remicade, Simponi, Stelara, Taltz, Tremfya) Methotrexate Leflunomide (Arava) Mycophenolate (Cellcept) Morrie Sheldon, Olumiant, or Rinvoq If you take Actemra or Kevzara, you DO NOT need to hold these for COVID19 infection.  If you test POSITIVE for COVID19 and have NO symptoms: First, call your PCP if you would like to receive COVID19 treatment AND Hold your medications for at least 10 days after the day that you tested positive Injectable medication (Benlysta, Cimzia, Cosentyx, Enbrel, Humira, Orencia, Remicade, Simponi, Stelara, Taltz, Tremfya) Methotrexate Leflunomide (Arava) Mycophenolate (Cellcept) Morrie Sheldon, Olumiant, or Rinvoq If you take Actemra or Kevzara, you DO NOT need to hold these for COVID19 infection.  If you have signs or symptoms of an infection or start antibiotics: First, call your PCP for workup of your infection. Hold your medication through the infection, until you complete your antibiotics, and until symptoms resolve if you take the following: Injectable medication (Actemra, Benlysta, Cimzia, Cosentyx, Enbrel, Humira, Kevzara,  Orencia, Remicade, Simponi, Stelara, Taltz, Tremfya) Methotrexate Leflunomide (Arava) Mycophenolate (Cellcept) Roma Kayser, or Rinvoq  Heart Disease Prevention   Your inflammatory disease increases your risk of heart disease which includes heart attack, stroke, atrial fibrillation (irregular  heartbeats), high blood pressure, heart failure and atherosclerosis (plaque in the arteries).  It is important to reduce your risk by:   Keep blood pressure, cholesterol, and blood sugar at healthy levels   Smoking Cessation   Maintain a healthy weight  BMI 20-25   Eat a healthy diet  Plenty of fresh fruit, vegetables, and whole grains  Limit saturated fats, foods high in sodium, and added sugars  DASH and Mediterranean diet   Increase physical activity  Recommend moderate physically activity for 150 minutes per week/ 30 minutes a day for five days a week These can be broken up into three separate ten-minute sessions during the day.   Reduce Stress  Meditation, slow breathing exercises, yoga, coloring books  Dental visits twice a year    COVID-19 vaccine recommendations:   COVID-19 vaccine is recommended for everyone (unless you are allergic to a vaccine component), even if you are on a medication that suppresses your immune system.   If you are on Methotrexate, Cellcept (mycophenolate), Rinvoq, Morrie Sheldon, and Olumiant- hold the medication for 1 week after each vaccine. Hold Methotrexate for 2 weeks after the single dose COVID-19 vaccine.   If you are on Orencia subcutaneous injection - hold medication one week prior to and one week after the first COVID-19 vaccine dose (only).   If you are on Orencia IV infusions- time vaccination administration so that the first COVID-19 vaccination will occur four weeks after the infusion and postpone the subsequent infusion by one week.   If you are on Cyclophosphamide or Rituxan infusions please contact your doctor prior to receiving the COVID-19 vaccine.   Do not take Tylenol or any anti-inflammatory medications (NSAIDs) 24 hours prior to the COVID-19 vaccination.   There is no direct evidence about the efficacy of the COVID-19 vaccine in individuals who are on medications that suppress the immune system.   Even if you are fully vaccinated,  and you are on any medications that suppress your immune system, please continue to wear a mask, maintain at least six feet social distance and practice hand hygiene.   If you develop a COVID-19 infection, please contact your PCP or our office to determine if you need monoclonal antibody infusion.  The booster vaccine is now available for immunocompromised patients.   Please see the following web sites for updated information.   [email or web address]

## 2021-05-13 ENCOUNTER — Encounter (HOSPITAL_COMMUNITY): Payer: Self-pay

## 2021-05-13 ENCOUNTER — Ambulatory Visit (HOSPITAL_COMMUNITY): Payer: Medicare Other

## 2021-05-13 ENCOUNTER — Encounter (HOSPITAL_COMMUNITY): Payer: Medicare Other

## 2021-05-13 DIAGNOSIS — M6281 Muscle weakness (generalized): Secondary | ICD-10-CM | POA: Diagnosis not present

## 2021-05-13 DIAGNOSIS — R29898 Other symptoms and signs involving the musculoskeletal system: Secondary | ICD-10-CM | POA: Diagnosis not present

## 2021-05-13 DIAGNOSIS — M545 Low back pain, unspecified: Secondary | ICD-10-CM

## 2021-05-13 DIAGNOSIS — M25552 Pain in left hip: Secondary | ICD-10-CM

## 2021-05-13 DIAGNOSIS — M25551 Pain in right hip: Secondary | ICD-10-CM | POA: Diagnosis not present

## 2021-05-13 DIAGNOSIS — R2689 Other abnormalities of gait and mobility: Secondary | ICD-10-CM

## 2021-05-13 DIAGNOSIS — M25561 Pain in right knee: Secondary | ICD-10-CM

## 2021-05-13 NOTE — Therapy (Signed)
[place], [place], [address] Phone: [phone]   Fax:  [phone]  Physical Therapy Treatment  Patient Details  Name: Nicole [name] MRN: [id number] Date of Birth: 09-06-1946 Referring Provider (PT): Ofilia Neas, PA-C   Encounter Date: 05/13/2021   PT End of Session - 05/13/21 1543     Visit Number 15    Number of Visits 28    Date for PT Re-Evaluation 06/14/21    Authorization Type Primary Medicare Secondary Tric[employer] (no auth, no VL)    Progress Note Due on Visit 16    PT Start Time 1448    PT Stop Time 1532    PT Time Calculation (min) 44 min    Equipment Utilized During Treatment Gait belt    Activity Tolerance Patient limited by fatigue;Patient tolerated treatment well    Behavior During Therapy WFL for tasks assessed/performed             Past Medical History:  Diagnosis Date   AC (acromioclavicular) joint bone spurs    lt shoulder   Anemia    Arthritis    Carpal tunnel syndrome, bilateral    CHF (congestive heart failure) (HCC)    Collagen vascular disease ([place])    Gastroesophageal reflux    Headache    recent visit to ER @ [facility] for severe headache   Hypertension    Lumbar stenosis    Hx of ESIs by Dr. Nelva Bush   Polyarthralgia    Polymyalgia [facility]    Shingles    Spinal stenosis     Past Surgical History:  Procedure Laterality Date   BACK SURGERY  07/05/2018, 07/2018   x2    BIOPSY  09/22/2020   Procedure: BIOPSY;  Surgeon: Rogene Houston, MD;  [facility];  Service: Endoscopy;;   CHOLECYSTECTOMY     COLONOSCOPY  06/11/2012   Procedure: COLONOSCOPY;  Surgeon: Jamesetta So, MD;  [facility];  Service: Gastroenterology;  Laterality: N/A;   COLONOSCOPY WITH PROPOFOL N/A 09/22/2020   Procedure: COLONOSCOPY WITH PROPOFOL;  Surgeon: Rogene Houston, MD;  [facility];  Service: Endoscopy;  Laterality: N/A;  730   HYSTEROSCOPY WITH D & C N/A  02/25/2014   Procedure: DILATATION AND CURETTAGE /HYSTEROSCOPY;  Surgeon: Florian Buff, MD;  Location: [facility];  Service: Gynecology;  Laterality: N/A;   POLYPECTOMY N/A 02/25/2014   Procedure: POLYPECTOMY;  Surgeon: Florian Buff, MD;  Location: [facility];  Service: Gynecology;  Laterality: N/A;   RESECTION DISTAL CLAVICAL Right 03/26/2015   Procedure: OPEN DISTAL CLAVICAL RESECTION ;  Surgeon: Netta Cedars, MD;  Location: [facility];  Service: Orthopedics;  Laterality: Right;    There were no vitals filed for this visit.   Subjective Assessment - 05/13/21 1503     Subjective Pt stated she has been trying to walk in morning.  Feels she has to go fast while walking or else she won't make it to destination.    Patient is accompained by: Family member    Pertinent History Patient is being seen in  physical therapy for  c/o back pain, knee pain and hip pain. Lumbar stenosis with 2 back surgeries.    Patient Stated Goals walk and drive car and lose weight    Currently in Pain? No/denies  [place] Adult PT Treatment/Exercise - 05/13/21 0001       Ambulation/Gait   Ambulation/Gait Yes    Ambulation Distance (Feet) 186 Feet    Assistive device Rolling walker    Gait Comments 2 rests 166ft then 67ft      Lumbar Exercises: Standing   Other Standing Lumbar Exercises Step up 4in then 6in 3 reps per LE each height      Lumbar Exercises: Seated   Long Arc Quad on Chair Strengthening;Both;10 reps    LAQ on Chair Weights (lbs) 3    Sit to Stand 10 reps    Sit to Stand Limitations with HHA    Other Seated Lumbar Exercises hamstring curls GTB 10x BLE                      PT Short Term Goals - 04/19/21 1045       PT SHORT TERM GOAL #1   Title Patient will be independent with HEP in order to improve functional outcomes.    Time 4    Period Weeks    Status New    Target Date 05/17/21      PT SHORT TERM GOAL #2   Title Patient will be able to  ambulate x 50 ft with RW and RPE no greater than 7/10    Baseline 9/10 RPE with ambulation x 48 ft    Period Weeks    Status New    Target Date 05/17/21      PT SHORT TERM GOAL #3   Title Patient will improve standing tolerance to 45 sec while performing dynamic upper body movements (reaching overhead to retrive sticker tiles) to improve effiiciency and safety with ADL performance    Baseline 20 sec with unilateral support    Time 4    Period Weeks    Status New    Target Date 05/17/21               PT Long Term Goals - 04/19/21 1048       PT LONG TERM GOAL #1   Title Patient will improve standing tolerance to 60 sec while performing dynamic upper body movements (reaching overhead to retrive sticker tiles) to improve effiiciency and safety with ADL performance    Baseline 20 sec    Time 8    Period Weeks    Status New    Target Date 06/14/21      PT LONG TERM GOAL #2   Title Patient will be able to ambulate x 50 ft with RW and RPE not exceeding 5/10 to manifest improve ambulation tolerance    Baseline 48 ft with RPE 9/10    Time 8    Period Weeks    Status New    Target Date 06/14/21                   Plan - 05/13/21 1548     Clinical Impression Statement Discussed slow controlled cadence for enegry conservation with ability to ambulate 170ft with 2 rest breaks, pt continues to require recovery time due to SOB and fatigue.  Began step up training for functional strengthening, able to complete 2-3 reps prior need for seated rest break.  Pt able to complete 4 in step BLE with Bil UE support, increased difficulty with Lt LE on 6in step height.  No reports of increased pain through session.    Personal Factors and Comorbidities Age;Fitness;Behavior Pattern;Past/Current Experience;Comorbidity 3+;Social Background;Time since onset  of injury/illness/exacerbation    Comorbidities CHF, RA, , polymyalgia, sedentary, obesity    Examination-Activity Limitations Locomotion  Level;Transfers;Bathing;Bed Mobility;Stand;Stairs;Squat;Lift;Sit;Dressing;Hygiene/Grooming;Carry;Bend;Reach Overhead    Examination-Participation Restrictions Church;Meal Prep;Cleaning;Community Activity;Volunteer;Shop;Yard Work    Stability/Clinical Decision Making Unstable/Unpredictable    Clinical Decision Making Moderate    Rehab Potential Good    PT Frequency 2x / week    PT Duration 8 weeks    PT Treatment/Interventions Patient/family education;Manual lymph drainage;Compression bandaging;Moist Heat;Traction;Ultrasound;Gait training;Stair training;Functional mobility training;Therapeutic activities;Therapeutic exercise;Balance training;Neuromuscular re-education;Manual techniques;Passive range of motion;Energy conservation;Joint Manipulations;ADLs/Self Care Home Management;Aquatic Therapy;Electrical Stimulation    PT Next Visit Plan Reassessment next session.  continue to progress functional strength and mobility.  F/U on walking at home and completion of HEP    PT Home Exercise Plan 5/17:  supine bridge, heelslides, lower trunk rotations; 5/20: sitting tall, scapular retraction 5/24 marching, LAQ, hip add iso; 6/3:  ankle pumps, LAQ, sitting marching, hip ab/adduction, diaphragmic breathing, and lymphatic squeeze.; 6/22:  ab set, sitting tall.    Consulted and Agree with Plan of Care Patient;Family member/caregiver    Family Member Consulted Jessica             Patient will benefit from skilled therapeutic intervention in order to improve the following deficits and impairments:  Decreased endurance, Obesity, Pain, Increased edema, Abnormal gait, Decreased activity tolerance, Decreased balance, Decreased mobility, Decreased strength, Difficulty walking  Visit Diagnosis: Other abnormalities of gait and mobility  Other symptoms and signs involving the musculoskeletal system  Muscle weakness (generalized)  Bilateral hip pain  Low back pain, unspecified back pain laterality,  unspecified chronicity, unspecified whether sciatica present  Pain in both knees, unspecified chronicity     Problem List Patient Active Problem List   Diagnosis Date Noted   Diverticulitis 05/02/2018   Diverticulitis of colon 05/01/2018   Morbid obesity with BMI of 50.0-59.9, adult ([place]) 05/01/2018   Chronic diastolic HF (heart failure) ([place]) 05/01/2018   On prednisone therapy 05/31/2017   Positive anti-CCP test 05/31/2017   Rheumatoid factor positive 05/31/2017   Memory loss 04/04/2017   Polymyalgia rheumatica ([place]) 04/04/2017   High risk medication use 12/07/2016   DDD (degenerative disc disease), lumbar 09/27/2016   HTN (hypertension) 06/05/2016   Chronic pain syndrome 06/05/2016   Acute diverticulitis 06/02/2016   Diverticulitis large intestine 06/02/2016   Spinal stenosis of lumbosacral region 02/29/2016   Joint pain 02/29/2016   Endometrial polyp 02/06/2014   Postmenopausal bleeding 01/30/2014   GERD (gastroesophageal reflux disease) 09/29/2013   Spinal stenosis, thoracic 09/29/2013   Shingles 09/29/2013   Thoracic or lumbosacral neuritis or radiculitis, unspecified 05/04/2011   Abnormality of gait 05/04/2011   Muscle weakness (generalized) 05/04/2011   Bilateral primary osteoarthritis of knee 04/07/2009   KNEE PAIN 04/07/2009    Ihor Austin, LPTA/CLT; CBIS [phone]  Aldona Lento 05/13/2021, 4:26 PM  [place], [place], [address] Phone: [phone]   Fax:  [phone]  Name: Nicole [name] MRN: [id number] Date of Birth: September 22, 1946

## 2021-05-16 ENCOUNTER — Ambulatory Visit (HOSPITAL_COMMUNITY): Payer: Medicare Other | Admitting: Physical Therapy

## 2021-05-16 ENCOUNTER — Encounter (HOSPITAL_COMMUNITY): Payer: Medicare Other | Admitting: Physical Therapy

## 2021-05-16 ENCOUNTER — Other Ambulatory Visit: Payer: Self-pay

## 2021-05-16 ENCOUNTER — Encounter (HOSPITAL_COMMUNITY): Payer: Self-pay | Admitting: Physical Therapy

## 2021-05-16 DIAGNOSIS — M25551 Pain in right hip: Secondary | ICD-10-CM

## 2021-05-16 DIAGNOSIS — M545 Low back pain, unspecified: Secondary | ICD-10-CM

## 2021-05-16 DIAGNOSIS — M6281 Muscle weakness (generalized): Secondary | ICD-10-CM | POA: Diagnosis not present

## 2021-05-16 DIAGNOSIS — M25552 Pain in left hip: Secondary | ICD-10-CM | POA: Diagnosis not present

## 2021-05-16 DIAGNOSIS — R29898 Other symptoms and signs involving the musculoskeletal system: Secondary | ICD-10-CM

## 2021-05-16 DIAGNOSIS — R2689 Other abnormalities of gait and mobility: Secondary | ICD-10-CM | POA: Diagnosis not present

## 2021-05-16 NOTE — Therapy (Signed)
[facility] [address] [place], [place], [address] Phone: [phone]   Fax:  [phone]  Physical Therapy Treatment and Progress Note  Patient Details  Name: Nicole [name] MRN: [id number] Date of Birth: 15-Jun-1946 Referring Provider (PT): Ofilia Neas, PA-C   Progress Note Reporting Period 04/19/21 to 05/16/21  See note below for Objective Data and Assessment of Progress/Goals.      Encounter Date: 05/16/2021   PT End of Session - 05/16/21 1456     Visit Number 16    Number of Visits 28    Date for PT Re-Evaluation 06/14/21    Authorization Type Primary Medicare Secondary Tric[employer] (no auth, no VL)    Progress Note Due on Visit 26    PT Start Time 1457   late to apt   PT Stop Time 1527    PT Time Calculation (min) 30 min    Equipment Utilized During Treatment Gait belt    Activity Tolerance Patient limited by fatigue;Patient tolerated treatment well    Behavior During Therapy WFL for tasks assessed/performed             Past Medical History:  Diagnosis Date   AC (acromioclavicular) joint bone spurs    lt shoulder   Anemia    Arthritis    Carpal tunnel syndrome, bilateral    CHF (congestive heart failure) (HCC)    Collagen vascular disease ([place])    Gastroesophageal reflux    Headache    recent visit to ER @ [facility] for severe headache   Hypertension    Lumbar stenosis    Hx of ESIs by Dr. Nelva Bush   Polyarthralgia    Polymyalgia [facility])    Shingles    Spinal stenosis     Past Surgical History:  Procedure Laterality Date   BACK SURGERY  07/05/2018, 07/2018   x2    BIOPSY  09/22/2020   Procedure: BIOPSY;  Surgeon: Rogene Houston, MD;  [facility];  Service: Endoscopy;;   CHOLECYSTECTOMY     COLONOSCOPY  06/11/2012   Procedure: COLONOSCOPY;  Surgeon: Jamesetta So, MD;  [facility];  Service: Gastroenterology;  Laterality: N/A;   COLONOSCOPY WITH PROPOFOL N/A 09/22/2020   Procedure:  COLONOSCOPY WITH PROPOFOL;  Surgeon: Rogene Houston, MD;  [facility];  Service: Endoscopy;  Laterality: N/A;  730   HYSTEROSCOPY WITH D & C N/A 02/25/2014   Procedure: DILATATION AND CURETTAGE /HYSTEROSCOPY;  Surgeon: Florian Buff, MD;  Location: [facility];  Service: Gynecology;  Laterality: N/A;   POLYPECTOMY N/A 02/25/2014   Procedure: POLYPECTOMY;  Surgeon: Florian Buff, MD;  Location: [facility];  Service: Gynecology;  Laterality: N/A;   RESECTION DISTAL CLAVICAL Right 03/26/2015   Procedure: OPEN DISTAL CLAVICAL RESECTION ;  Surgeon: Netta Cedars, MD;  Location: [place];  Service: Orthopedics;  Laterality: Right;    There were no vitals filed for this visit.   Subjective Assessment - 05/16/21 1501     Subjective States that she has about 6/10 pain in her right hip. States that she has been walking more at home. States that she thinks she is getting stronger but she feels weaker.    Patient is accompained by: Family member    Pertinent History Patient is being seen in  physical therapy for  c/o back pain, knee pain and hip pain. Lumbar stenosis with 2 back surgeries.    Patient Stated Goals walk and drive  car and lose weight    Currently in Pain? Yes    Pain Score 6     Pain Location Leg    Pain Orientation Right    Pain Descriptors / Indicators Aching                [facility] PT Assessment - 05/16/21 0001       Assessment   Medical Diagnosis knee OA, DDD    Referring Provider (PT) Ofilia Neas, PA-C      Ambulation/Gait   Ambulation/Gait Yes    Ambulation Distance (Feet) 78 Feet    Assistive device Rolling walker    Gait Comments no rest break; RPE 6/10      Balance   Balance Assessed Yes      Static Standing Balance   Static Standing - Comment/# of Minutes standing with no UE support and reaching forward - max about of time 14 seconds- 3 trials, with 2 UE support able to stand max of 45 seconds                                   PT  Education - 05/16/21 1529     Education Details on progress, on importance of adherence to HEP throuhgout the day, on needing to meet goals and show demonstrated progress    Person(s) Educated Patient    Methods Explanation    Comprehension Verbalized understanding              PT Short Term Goals - 05/16/21 1502       PT SHORT TERM GOAL #1   Title Patient will be independent with HEP in order to improve functional outcomes.    Baseline states she tries to do exercises daily    Time 4    Period Weeks    Status On-going    Target Date 05/17/21      PT SHORT TERM GOAL #2   Title Patient will be able to ambulate x 50 ft with RW and RPE no greater than 7/10    Baseline 6/10 RPE with ambulation x 78 ft    Period Weeks    Status Achieved    Target Date 05/17/21      PT SHORT TERM GOAL #3   Title Patient will improve standing tolerance to 45 sec while performing dynamic upper body movements (reaching overhead to [employer] tiles) to improve effiiciency and safety with ADL performance    Baseline 14 seconds with no support, 45 seconds with bilateral upper extremity support    Time 4    Period Weeks    Status On-going    Target Date 05/17/21               PT Long Term Goals - 05/16/21 1525       PT LONG TERM GOAL #1   Title Patient will improve standing tolerance to 60 sec while performing dynamic upper body movements (reaching overhead to retrive sticker tiles) to improve effiiciency and safety with ADL performance    Baseline 14 seconds    Time 8    Period Weeks    Status On-going      PT LONG TERM GOAL #2   Title Patient will be able to ambulate x 50 ft with RW and RPE not exceeding 5/10 to manifest improve ambulation tolerance    Baseline 78 feet with 6/10 RPE    Time  8    Period Weeks    Status On-going                   Plan - 05/16/21 1457     Clinical Impression Statement Patient present for a progress note on this date. Overall one  short term goal met on this date. Session limited secondary to late arrival. Discussed and educated patient and caregiver importance of performing exercises daily throughout the day to expect improvements in stretch. Instructed patient to practice standing at the counter without upper extremity support for as long as she can and with support for as long as she can and to report these numbs back to the clinician each session. Will continue to focus on standing endurance, walking endurance and overall functional mobility.    Personal Factors and Comorbidities Age;Fitness;Behavior Pattern;Past/Current Experience;Comorbidity 3+;Social Background;Time since onset of injury/illness/exacerbation    Comorbidities CHF, RA, , polymyalgia, sedentary, obesity    Examination-Activity Limitations Locomotion Level;Transfers;Bathing;Bed Mobility;Stand;Stairs;Squat;Lift;Sit;Dressing;Hygiene/Grooming;Carry;Bend;Reach Overhead    Examination-Participation Restrictions Church;Meal Prep;Cleaning;Community Activity;Volunteer;Shop;Yard Work    Stability/Clinical Decision Making Unstable/Unpredictable    Rehab Potential Good    PT Frequency 2x / week    PT Duration 8 weeks    PT Treatment/Interventions Patient/family education;Manual lymph drainage;Compression bandaging;Moist Heat;Traction;Ultrasound;Gait training;Stair training;Functional mobility training;Therapeutic activities;Therapeutic exercise;Balance training;Neuromuscular re-education;Manual techniques;Passive range of motion;Energy conservation;Joint Manipulations;ADLs/Self Care Home Management;Aquatic Therapy;Electrical Stimulation    PT Next Visit Plan continue to progress functional strength and mobility.  F/U on walking at home and completion of HEP    PT Home Exercise Plan 5/17:  supine bridge, heelslides, lower trunk rotations; 5/20: sitting tall, scapular retraction 5/24 marching, LAQ, hip add iso; 6/3:  ankle pumps, LAQ, sitting marching, hip ab/adduction,  diaphragmic breathing, and lymphatic squeeze.; 6/22:  ab set, sitting tall.    Consulted and Agree with Plan of Care Patient;Family member/caregiver    Family Member Consulted Jessica             Patient will benefit from skilled therapeutic intervention in order to improve the following deficits and impairments:  Decreased endurance, Obesity, Pain, Increased edema, Abnormal gait, Decreased activity tolerance, Decreased balance, Decreased mobility, Decreased strength, Difficulty walking  Visit Diagnosis: Other abnormalities of gait and mobility  Other symptoms and signs involving the musculoskeletal system  Muscle weakness (generalized)  Bilateral hip pain  Low back pain, unspecified back pain laterality, unspecified chronicity, unspecified whether sciatica present     Problem List Patient Active Problem List   Diagnosis Date Noted   Diverticulitis 05/02/2018   Diverticulitis of colon 05/01/2018   Morbid obesity with BMI of 50.0-59.9, adult ([place]) 05/01/2018   Chronic diastolic HF (heart failure) ([place]) 05/01/2018   On prednisone therapy 05/31/2017   Positive anti-CCP test 05/31/2017   Rheumatoid factor positive 05/31/2017   Memory loss 04/04/2017   Polymyalgia rheumatica ([place]) 04/04/2017   High risk medication use 12/07/2016   DDD (degenerative disc disease), lumbar 09/27/2016   HTN (hypertension) 06/05/2016   Chronic pain syndrome 06/05/2016   Acute diverticulitis 06/02/2016   Diverticulitis large intestine 06/02/2016   Spinal stenosis of lumbosacral region 02/29/2016   Joint pain 02/29/2016   Endometrial polyp 02/06/2014   Postmenopausal bleeding 01/30/2014   GERD (gastroesophageal reflux disease) 09/29/2013   Spinal stenosis, thoracic 09/29/2013   Shingles 09/29/2013   Thoracic or lumbosacral neuritis or radiculitis, unspecified 05/04/2011   Abnormality of gait 05/04/2011   Muscle weakness (generalized) 05/04/2011   Bilateral primary osteoarthritis of knee  04/07/2009   KNEE  PAIN 04/07/2009    3:56 PM, 05/16/21 Jerene Pitch, DPT Physical Therapy with [facility]  [phone] office   [facility] [address] [place], [place], [address] Phone: [phone]   Fax:  [phone]  Name: Nicole [name] MRN: [id number] Date of Birth: 1946-06-21

## 2021-05-18 ENCOUNTER — Ambulatory Visit (HOSPITAL_COMMUNITY): Payer: Medicare Other | Admitting: Physical Therapy

## 2021-05-18 ENCOUNTER — Encounter (HOSPITAL_COMMUNITY): Payer: Medicare Other | Admitting: Physical Therapy

## 2021-05-18 ENCOUNTER — Encounter (HOSPITAL_COMMUNITY): Payer: Self-pay | Admitting: Physical Therapy

## 2021-05-18 ENCOUNTER — Other Ambulatory Visit: Payer: Self-pay

## 2021-05-18 DIAGNOSIS — R29898 Other symptoms and signs involving the musculoskeletal system: Secondary | ICD-10-CM

## 2021-05-18 DIAGNOSIS — R2689 Other abnormalities of gait and mobility: Secondary | ICD-10-CM

## 2021-05-18 DIAGNOSIS — N1832 Chronic kidney disease, stage 3b: Secondary | ICD-10-CM | POA: Diagnosis not present

## 2021-05-18 DIAGNOSIS — M25551 Pain in right hip: Secondary | ICD-10-CM | POA: Diagnosis not present

## 2021-05-18 DIAGNOSIS — M545 Low back pain, unspecified: Secondary | ICD-10-CM | POA: Diagnosis not present

## 2021-05-18 DIAGNOSIS — E211 Secondary hyperparathyroidism, not elsewhere classified: Secondary | ICD-10-CM | POA: Diagnosis not present

## 2021-05-18 DIAGNOSIS — M25552 Pain in left hip: Secondary | ICD-10-CM | POA: Diagnosis not present

## 2021-05-18 DIAGNOSIS — M6281 Muscle weakness (generalized): Secondary | ICD-10-CM | POA: Diagnosis not present

## 2021-05-18 DIAGNOSIS — I129 Hypertensive chronic kidney disease with stage 1 through stage 4 chronic kidney disease, or unspecified chronic kidney disease: Secondary | ICD-10-CM | POA: Diagnosis not present

## 2021-05-18 NOTE — Therapy (Signed)
[facility] [place], [place], [address] Phone: [phone]   Fax:  [phone]  Physical Therapy Treatment  Patient Details  Name: Nicole [name] MRN: [id number] Date of Birth: Feb 14, 1946 Referring Provider (PT): Ofilia Neas, PA-C   Encounter Date: 05/18/2021   PT End of Session - 05/18/21 1542     Visit Number 17    Number of Visits 28    Date for PT Re-Evaluation 06/14/21    Authorization Type Primary Medicare Secondary Tricare (no auth, no VL)    Progress Note Due on Visit 26    PT Start Time 1455   pt late   PT Stop Time 1540    PT Time Calculation (min) 45 min    Equipment Utilized During Treatment Gait belt    Activity Tolerance Patient limited by fatigue;Patient tolerated treatment well    Behavior During Therapy WFL for tasks assessed/performed             Past Medical History:  Diagnosis Date   AC (acromioclavicular) joint bone spurs    lt shoulder   Anemia    Arthritis    Carpal tunnel syndrome, bilateral    CHF (congestive heart failure) (HCC)    Collagen vascular disease ([place])    Gastroesophageal reflux    Headache    recent visit to ER @ [facility] for severe headache   Hypertension    Lumbar stenosis    Hx of ESIs by Dr. Nelva Bush   Polyarthralgia    Polymyalgia [facility])    Shingles    Spinal stenosis     Past Surgical History:  Procedure Laterality Date   BACK SURGERY  07/05/2018, 07/2018   x2    BIOPSY  09/22/2020   Procedure: BIOPSY;  Surgeon: Rogene Houston, MD;  [facility];  Service: Endoscopy;;   CHOLECYSTECTOMY     COLONOSCOPY  06/11/2012   Procedure: COLONOSCOPY;  Surgeon: Jamesetta So, MD;  [facility];  Service: Gastroenterology;  Laterality: N/A;   COLONOSCOPY WITH PROPOFOL N/A 09/22/2020   Procedure: COLONOSCOPY WITH PROPOFOL;  Surgeon: Rogene Houston, MD;  [facility];  Service: Endoscopy;  Laterality: N/A;  730   HYSTEROSCOPY WITH D & C  N/A 02/25/2014   Procedure: DILATATION AND CURETTAGE /HYSTEROSCOPY;  Surgeon: Florian Buff, MD;  [facility];  Service: Gynecology;  Laterality: N/A;   POLYPECTOMY N/A 02/25/2014   Procedure: POLYPECTOMY;  Surgeon: Florian Buff, MD;  [facility];  Service: Gynecology;  Laterality: N/A;   RESECTION DISTAL CLAVICAL Right 03/26/2015   Procedure: OPEN DISTAL CLAVICAL RESECTION ;  Surgeon: Netta Cedars, MD;  Location: [facility];  Service: Orthopedics;  Laterality: Right;    There were no vitals filed for this visit.   Subjective Assessment - 05/18/21 1458     Subjective Pt states that the bottom of her LT foot going up to her knee has increased pain.  She is hurting in her low back    Patient is accompained by: Family member    Pertinent History Patient is being seen in  physical therapy for  c/o back pain, knee pain and hip pain. Lumbar stenosis with 2 back surgeries.    Patient Stated Goals walk and drive car and lose weight    Currently in Pain? Yes    Pain Score 6     Pain Location Leg    Pain Orientation Left    Pain  Descriptors / Indicators Burning    Pain Type Acute pain    Pain Onset Yesterday    Pain Frequency Constant    Aggravating Factors  wt bearing    Pain Relieving Factors not sure    Pain Score 7    Pain Location Back    Pain Orientation Lower    Pain Descriptors / Indicators Aching    Pain Type Chronic pain    Pain Onset More than a month ago    Pain Frequency Intermittent    Aggravating Factors  WB                               [facility] Adult PT Treatment/Exercise - 05/18/21 0001       Ambulation/Gait   Ambulation/Gait Yes    Ambulation Distance (Feet) 74 Feet   2nd time x 60 ft   Assistive device Rolling walker    Gait Comments no rest break; RPE 6/10      Exercises   Exercises Knee/Hip      Lumbar Exercises: Standing   Functional Squats 10 reps    Functional Squats Limitations mini    Other Standing Lumbar Exercises star gaze 12  and 3 x 10 each      Lumbar Exercises: Seated   Sit to Stand 10 reps                      PT Short Term Goals - 05/16/21 1502       PT SHORT TERM GOAL #1   Title Patient will be independent with HEP in order to improve functional outcomes.    Baseline states she tries to do exercises daily    Time 4    Period Weeks    Status On-going    Target Date 05/17/21      PT SHORT TERM GOAL #2   Title Patient will be able to ambulate x 50 ft with RW and RPE no greater than 7/10    Baseline 6/10 RPE with ambulation x 78 ft    Period Weeks    Status Achieved    Target Date 05/17/21      PT SHORT TERM GOAL #3   Title Patient will improve standing tolerance to 45 sec while performing dynamic upper body movements (reaching overhead to [employer] tiles) to improve effiiciency and safety with ADL performance    Baseline 14 seconds with no support, 45 seconds with bilateral upper extremity support    Time 4    Period Weeks    Status On-going    Target Date 05/17/21               PT Long Term Goals - 05/16/21 1525       PT LONG TERM GOAL #1   Title Patient will improve standing tolerance to 60 sec while performing dynamic upper body movements (reaching overhead to retrive sticker tiles) to improve effiiciency and safety with ADL performance    Baseline 14 seconds    Time 8    Period Weeks    Status On-going      PT LONG TERM GOAL #2   Title Patient will be able to ambulate x 50 ft with RW and RPE not exceeding 5/10 to manifest improve ambulation tolerance    Baseline 78 feet with 6/10 RPE    Time 8    Period Weeks    Status On-going  Plan - 05/18/21 1544     Clinical Impression Statement Continued to work on balance and strengthening with pt needing only one rest break throughout treatment today.  PT continues to panic easily when standing due to fear of falling.    Personal Factors and Comorbidities Age;Fitness;Behavior  Pattern;Past/Current Experience;Comorbidity 3+;Social Background;Time since onset of injury/illness/exacerbation    Comorbidities CHF, RA, , polymyalgia, sedentary, obesity    Examination-Activity Limitations Locomotion Level;Transfers;Bathing;Bed Mobility;Stand;Stairs;Squat;Lift;Sit;Dressing;Hygiene/Grooming;Carry;Bend;Reach Overhead    Examination-Participation Restrictions Church;Meal Prep;Cleaning;Community Activity;Volunteer;Shop;Yard Work    Stability/Clinical Decision Making Unstable/Unpredictable    Rehab Potential Good    PT Frequency 2x / week    PT Duration 8 weeks    PT Treatment/Interventions Patient/family education;Manual lymph drainage;Compression bandaging;Moist Heat;Traction;Ultrasound;Gait training;Stair training;Functional mobility training;Therapeutic activities;Therapeutic exercise;Balance training;Neuromuscular re-education;Manual techniques;Passive range of motion;Energy conservation;Joint Manipulations;ADLs/Self Care Home Management;Aquatic Therapy;Electrical Stimulation    PT Next Visit Plan continue to progress functional strength and mobility.    PT Home Exercise Plan 5/17:  supine bridge, heelslides, lower trunk rotations; 5/20: sitting tall, scapular retraction 5/24 marching, LAQ, hip add iso; 6/3:  ankle pumps, LAQ, sitting marching, hip ab/adduction, diaphragmic breathing, and lymphatic squeeze.; 6/22:  ab set, sitting tall.    Consulted and Agree with Plan of Care Patient;Family member/caregiver    Family Member Consulted Jessica             Patient will benefit from skilled therapeutic intervention in order to improve the following deficits and impairments:  Decreased endurance, Obesity, Pain, Increased edema, Abnormal gait, Decreased activity tolerance, Decreased balance, Decreased mobility, Decreased strength, Difficulty walking  Visit Diagnosis: Other abnormalities of gait and mobility  Other symptoms and signs involving the musculoskeletal  system  Muscle weakness (generalized)     Problem List Patient Active Problem List   Diagnosis Date Noted   Diverticulitis 05/02/2018   Diverticulitis of colon 05/01/2018   Morbid obesity with BMI of 50.0-59.9, adult ([place]) 05/01/2018   Chronic diastolic HF (heart failure) ([place]) 05/01/2018   On prednisone therapy 05/31/2017   Positive anti-CCP test 05/31/2017   Rheumatoid factor positive 05/31/2017   Memory loss 04/04/2017   Polymyalgia rheumatica ([place]) 04/04/2017   High risk medication use 12/07/2016   DDD (degenerative disc disease), lumbar 09/27/2016   HTN (hypertension) 06/05/2016   Chronic pain syndrome 06/05/2016   Acute diverticulitis 06/02/2016   Diverticulitis large intestine 06/02/2016   Spinal stenosis of lumbosacral region 02/29/2016   Joint pain 02/29/2016   Endometrial polyp 02/06/2014   Postmenopausal bleeding 01/30/2014   GERD (gastroesophageal reflux disease) 09/29/2013   Spinal stenosis, thoracic 09/29/2013   Shingles 09/29/2013   Thoracic or lumbosacral neuritis or radiculitis, unspecified 05/04/2011   Abnormality of gait 05/04/2011   Muscle weakness (generalized) 05/04/2011   Bilateral primary osteoarthritis of knee 04/07/2009   KNEE PAIN 04/07/2009   Rayetta Humphrey, PT CLT [phone]  05/18/2021, 3:46 PM  [place], [place], [address] Phone: [phone]   Fax:  [phone]  Name: Nicole [name] MRN: [id number] Date of Birth: 03-Sep-1946

## 2021-05-20 ENCOUNTER — Telehealth (HOSPITAL_COMMUNITY): Payer: Self-pay | Admitting: Physical Therapy

## 2021-05-20 ENCOUNTER — Encounter (HOSPITAL_COMMUNITY): Payer: Medicare Other | Admitting: Physical Therapy

## 2021-05-20 ENCOUNTER — Ambulatory Visit (HOSPITAL_COMMUNITY): Payer: Medicare Other | Admitting: Physical Therapy

## 2021-05-20 DIAGNOSIS — E211 Secondary hyperparathyroidism, not elsewhere classified: Secondary | ICD-10-CM | POA: Diagnosis not present

## 2021-05-20 DIAGNOSIS — N1832 Chronic kidney disease, stage 3b: Secondary | ICD-10-CM | POA: Diagnosis not present

## 2021-05-20 DIAGNOSIS — I129 Hypertensive chronic kidney disease with stage 1 through stage 4 chronic kidney disease, or unspecified chronic kidney disease: Secondary | ICD-10-CM | POA: Diagnosis not present

## 2021-05-20 DIAGNOSIS — N170 Acute kidney failure with tubular necrosis: Secondary | ICD-10-CM | POA: Diagnosis not present

## 2021-05-20 DIAGNOSIS — I5033 Acute on chronic diastolic (congestive) heart failure: Secondary | ICD-10-CM | POA: Diagnosis not present

## 2021-05-20 NOTE — Telephone Encounter (Signed)
Pt called stating she could not make it b/c she was at her [facility] apptment and they keep her a long time.

## 2021-05-20 NOTE — Telephone Encounter (Signed)
PT states that she had a 12:45 appointment at the MD and she just got out.  This will not be counted as a no show.  Rayetta Humphrey, [place] CLT [phone]

## 2021-05-23 ENCOUNTER — Other Ambulatory Visit: Payer: Self-pay

## 2021-05-23 ENCOUNTER — Ambulatory Visit (HOSPITAL_COMMUNITY): Payer: Medicare Other | Attending: Physician Assistant | Admitting: Physical Therapy

## 2021-05-23 DIAGNOSIS — M25552 Pain in left hip: Secondary | ICD-10-CM | POA: Insufficient documentation

## 2021-05-23 DIAGNOSIS — M25551 Pain in right hip: Secondary | ICD-10-CM | POA: Diagnosis not present

## 2021-05-23 DIAGNOSIS — M25562 Pain in left knee: Secondary | ICD-10-CM | POA: Insufficient documentation

## 2021-05-23 DIAGNOSIS — M25561 Pain in right knee: Secondary | ICD-10-CM | POA: Diagnosis not present

## 2021-05-23 DIAGNOSIS — R2689 Other abnormalities of gait and mobility: Secondary | ICD-10-CM | POA: Diagnosis not present

## 2021-05-23 DIAGNOSIS — R29898 Other symptoms and signs involving the musculoskeletal system: Secondary | ICD-10-CM | POA: Insufficient documentation

## 2021-05-23 DIAGNOSIS — M6281 Muscle weakness (generalized): Secondary | ICD-10-CM | POA: Diagnosis not present

## 2021-05-23 DIAGNOSIS — M545 Low back pain, unspecified: Secondary | ICD-10-CM | POA: Diagnosis not present

## 2021-05-23 NOTE — Therapy (Signed)
[place], [place], [address] Phone: [phone]   Fax:  [phone]  Physical Therapy Treatment  Patient Details  Name: Nicole [name] MRN: [id number] Date of Birth: August 14, 1946 Referring Provider (PT): Ofilia Neas, PA-C   Encounter Date: 05/23/2021   PT End of Session - 05/23/21 1459     Visit Number 18    Number of Visits 28    Date for PT Re-Evaluation 06/14/21    Authorization Type Primary Medicare Secondary Tricare (no auth, no VL)    Progress Note Due on Visit 26    PT Start Time 1330    PT Stop Time 1400    PT Time Calculation (min) 30 min    Equipment Utilized During Treatment Gait belt    Activity Tolerance Patient limited by fatigue;Patient tolerated treatment well    Behavior During Therapy WFL for tasks assessed/performed             Past Medical History:  Diagnosis Date   AC (acromioclavicular) joint bone spurs    lt shoulder   Anemia    Arthritis    Carpal tunnel syndrome, bilateral    CHF (congestive heart failure) (HCC)    Collagen vascular disease ([place])    Gastroesophageal reflux    Headache    recent visit to ER @ [facility] for severe headache   Hypertension    Lumbar stenosis    Hx of ESIs by Dr. Nelva Bush   Polyarthralgia    Polymyalgia [facility])    Shingles    Spinal stenosis     Past Surgical History:  Procedure Laterality Date   BACK SURGERY  07/05/2018, 07/2018   x2    BIOPSY  09/22/2020   Procedure: BIOPSY;  Surgeon: Rogene Houston, MD;  [facility];  Service: Endoscopy;;   CHOLECYSTECTOMY     COLONOSCOPY  06/11/2012   Procedure: COLONOSCOPY;  Surgeon: Jamesetta So, MD;  [facility];  Service: Gastroenterology;  Laterality: N/A;   COLONOSCOPY WITH PROPOFOL N/A 09/22/2020   Procedure: COLONOSCOPY WITH PROPOFOL;  Surgeon: Rogene Houston, MD;  [facility];  Service: Endoscopy;  Laterality: N/A;  730   HYSTEROSCOPY WITH D & C N/A  02/25/2014   Procedure: DILATATION AND CURETTAGE /HYSTEROSCOPY;  Surgeon: Florian Buff, MD;  [facility]: [facility];  Service: Gynecology;  Laterality: N/A;   POLYPECTOMY N/A 02/25/2014   Procedure: POLYPECTOMY;  Surgeon: Florian Buff, MD;  [facility]: [facility];  Service: Gynecology;  Laterality: N/A;   RESECTION DISTAL CLAVICAL Right 03/26/2015   Procedure: OPEN DISTAL CLAVICAL RESECTION ;  Surgeon: Netta Cedars, MD;  Location: [place];  Service: Orthopedics;  Laterality: Right;    There were no vitals filed for this visit.   Subjective Assessment - 05/23/21 1404     Subjective pt late for appt.  Spoke with CG regarding recurrent tardiness;  Pt appologizes for not doing much as her new fluid pill is making her too tired.    Currently in Pain? Yes    Pain Score 5     Pain Location Leg    Pain Orientation Left    Pain Descriptors / Indicators Burning                               [facility] Adult PT Treatment/Exercise - 05/23/21 0001       Ambulation/Gait   Ambulation/Gait  Yes    Ambulation Distance (Feet) 180 Feet   80'X80'x30"   Assistive device Rolling walker    Gait Comments completed 3 bouts of walking, 80', 80', 30' all with shot seated rest break.      Lumbar Exercises: Standing   Functional Squats 10 reps    Functional Squats Limitations mini    Other Standing Lumbar Exercises standing abduction Rt 5X, Lt unable to lift      Lumbar Exercises: Seated   Long Arc Quad on Chair Both;10 reps    LAQ on Chair Weights (lbs) while resting    Sit to Stand 5 reps                      PT Short Term Goals - 05/16/21 1502       PT SHORT TERM GOAL #1   Title Patient will be independent with HEP in order to improve functional outcomes.    Baseline states she tries to do exercises daily    Time 4    Period Weeks    Status On-going    Target Date 05/17/21      PT SHORT TERM GOAL #2   Title Patient will be able to ambulate x 50 ft with RW and RPE no greater than  7/10    Baseline 6/10 RPE with ambulation x 78 ft    Period Weeks    Status Achieved    Target Date 05/17/21      PT SHORT TERM GOAL #3   Title Patient will improve standing tolerance to 45 sec while performing dynamic upper body movements (reaching overhead to [employer] tiles) to improve effiiciency and safety with ADL performance    Baseline 14 seconds with no support, 45 seconds with bilateral upper extremity support    Time 4    Period Weeks    Status On-going    Target Date 05/17/21               PT Long Term Goals - 05/16/21 1525       PT LONG TERM GOAL #1   Title Patient will improve standing tolerance to 60 sec while performing dynamic upper body movements (reaching overhead to retrive sticker tiles) to improve effiiciency and safety with ADL performance    Baseline 14 seconds    Time 8    Period Weeks    Status On-going      PT LONG TERM GOAL #2   Title Patient will be able to ambulate x 50 ft with RW and RPE not exceeding 5/10 to manifest improve ambulation tolerance    Baseline 78 feet with 6/10 RPE    Time 8    Period Weeks    Status On-going                   Plan - 05/23/21 1454     Clinical Impression Statement Treatment limited by tardiness and redirection to task.  Pt completing 3 bouts of ambulation with 3 bouts of 80 feet and 1 of 30 feet towards end due to fatigue. Pt began complaining of  shoulder more today and Rt knee buckling.  Educated on importance of being complaint with HEP; Pt did ask if her pump would make her LE's stronger and discussed the purpose of this as related to her LE lymphedema    Personal Factors and Comorbidities Age;Fitness;Behavior Pattern;Past/Current Experience;Comorbidity 3+;Social Background;Time since onset of injury/illness/exacerbation    Comorbidities CHF, RA, , polymyalgia,  sedentary, obesity    Examination-Activity Limitations Locomotion Level;Transfers;Bathing;Bed  Mobility;Stand;Stairs;Squat;Lift;Sit;Dressing;Hygiene/Grooming;Carry;Bend;Reach Overhead    Examination-Participation Restrictions Church;Meal Prep;Cleaning;Community Activity;Volunteer;Shop;Yard Work    Stability/Clinical Decision Making Unstable/Unpredictable    Rehab Potential Good    PT Frequency 2x / week    PT Duration 8 weeks    PT Treatment/Interventions Patient/family education;Manual lymph drainage;Compression bandaging;Moist Heat;Traction;Ultrasound;Gait training;Stair training;Functional mobility training;Therapeutic activities;Therapeutic exercise;Balance training;Neuromuscular re-education;Manual techniques;Passive range of motion;Energy conservation;Joint Manipulations;ADLs/Self Care Home Management;Aquatic Therapy;Electrical Stimulation    PT Next Visit Plan continue to progress functional strength and mobility.    PT Home Exercise Plan 5/17:  supine bridge, heelslides, lower trunk rotations; 5/20: sitting tall, scapular retraction 5/24 marching, LAQ, hip add iso; 6/3:  ankle pumps, LAQ, sitting marching, hip ab/adduction, diaphragmic breathing, and lymphatic squeeze.; 6/22:  ab set, sitting tall.    Consulted and Agree with Plan of Care Patient;Family member/caregiver    Family Member Consulted Jessica             Patient will benefit from skilled therapeutic intervention in order to improve the following deficits and impairments:  Decreased endurance, Obesity, Pain, Increased edema, Abnormal gait, Decreased activity tolerance, Decreased balance, Decreased mobility, Decreased strength, Difficulty walking  Visit Diagnosis: Other abnormalities of gait and mobility  Other symptoms and signs involving the musculoskeletal system  Muscle weakness (generalized)     Problem List Patient Active Problem List   Diagnosis Date Noted   Diverticulitis 05/02/2018   Diverticulitis of colon 05/01/2018   Morbid obesity with BMI of 50.0-59.9, adult ([place]) 05/01/2018   Chronic  diastolic HF (heart failure) ([place]) 05/01/2018   On prednisone therapy 05/31/2017   Positive anti-CCP test 05/31/2017   Rheumatoid factor positive 05/31/2017   Memory loss 04/04/2017   Polymyalgia rheumatica ([place]) 04/04/2017   High risk medication use 12/07/2016   DDD (degenerative disc disease), lumbar 09/27/2016   HTN (hypertension) 06/05/2016   Chronic pain syndrome 06/05/2016   Acute diverticulitis 06/02/2016   Diverticulitis large intestine 06/02/2016   Spinal stenosis of lumbosacral region 02/29/2016   Joint pain 02/29/2016   Endometrial polyp 02/06/2014   Postmenopausal bleeding 01/30/2014   GERD (gastroesophageal reflux disease) 09/29/2013   Spinal stenosis, thoracic 09/29/2013   Shingles 09/29/2013   Thoracic or lumbosacral neuritis or radiculitis, unspecified 05/04/2011   Abnormality of gait 05/04/2011   Muscle weakness (generalized) 05/04/2011   Bilateral primary osteoarthritis of knee 04/07/2009   KNEE PAIN 04/07/2009    Teena Irani 05/23/2021, 3:00 PM  [place], [place], [address] Phone: [phone]   Fax:  [phone]  Name: Nicole [name] MRN: [id number] Date of Birth: 03-Nov-1945

## 2021-05-25 ENCOUNTER — Ambulatory Visit (HOSPITAL_COMMUNITY): Payer: Medicare Other | Admitting: Physical Therapy

## 2021-05-25 ENCOUNTER — Other Ambulatory Visit: Payer: Self-pay

## 2021-05-25 ENCOUNTER — Encounter (HOSPITAL_COMMUNITY): Payer: Self-pay | Admitting: Physical Therapy

## 2021-05-25 DIAGNOSIS — M6281 Muscle weakness (generalized): Secondary | ICD-10-CM

## 2021-05-25 DIAGNOSIS — M25551 Pain in right hip: Secondary | ICD-10-CM | POA: Diagnosis not present

## 2021-05-25 DIAGNOSIS — M545 Low back pain, unspecified: Secondary | ICD-10-CM

## 2021-05-25 DIAGNOSIS — M25562 Pain in left knee: Secondary | ICD-10-CM

## 2021-05-25 DIAGNOSIS — R2689 Other abnormalities of gait and mobility: Secondary | ICD-10-CM | POA: Diagnosis not present

## 2021-05-25 DIAGNOSIS — M25552 Pain in left hip: Secondary | ICD-10-CM

## 2021-05-25 DIAGNOSIS — R29898 Other symptoms and signs involving the musculoskeletal system: Secondary | ICD-10-CM | POA: Diagnosis not present

## 2021-05-25 DIAGNOSIS — M25561 Pain in right knee: Secondary | ICD-10-CM

## 2021-05-25 NOTE — Therapy (Signed)
[place], [place], [address] Phone: [phone]   Fax:  [phone]  Physical Therapy Treatment  Patient Details  Name: Nicole [name] MRN: [id number] Date of Birth: 03-01-1946 Referring Provider (PT): Ofilia Neas, PA-C   Encounter Date: 05/25/2021   PT End of Session - 05/25/21 1000     Visit Number 19    Number of Visits 28    Date for PT Re-Evaluation 06/14/21    Authorization Type Primary Medicare Secondary Tricare (no auth, no VL)    Progress Note Due on Visit 26    PT Start Time 1001    PT Stop Time 1034    PT Time Calculation (min) 33 min    Equipment Utilized During Treatment Gait belt    Activity Tolerance Patient limited by fatigue;Patient tolerated treatment well    Behavior During Therapy WFL for tasks assessed/performed             Past Medical History:  Diagnosis Date   AC (acromioclavicular) joint bone spurs    lt shoulder   Anemia    Arthritis    Carpal tunnel syndrome, bilateral    CHF (congestive heart failure) (HCC)    Collagen vascular disease ([place])    Gastroesophageal reflux    Headache    recent visit to ER @ [facility] for severe headache   Hypertension    Lumbar stenosis    Hx of ESIs by Dr. Nelva Bush   Polyarthralgia    Polymyalgia [facility])    Shingles    Spinal stenosis     Past Surgical History:  Procedure Laterality Date   BACK SURGERY  07/05/2018, 07/2018   x2    BIOPSY  09/22/2020   Procedure: BIOPSY;  Surgeon: Rogene Houston, MD;  [facility];  Service: Endoscopy;;   CHOLECYSTECTOMY     COLONOSCOPY  06/11/2012   Procedure: COLONOSCOPY;  Surgeon: Jamesetta So, MD;  [facility];  Service: Gastroenterology;  Laterality: N/A;   COLONOSCOPY WITH PROPOFOL N/A 09/22/2020   Procedure: COLONOSCOPY WITH PROPOFOL;  Surgeon: Rogene Houston, MD;  [facility];  Service: Endoscopy;  Laterality: N/A;  730   HYSTEROSCOPY WITH D & C N/A  02/25/2014   Procedure: DILATATION AND CURETTAGE /HYSTEROSCOPY;  Surgeon: Florian Buff, MD;  [facility];  Service: Gynecology;  Laterality: N/A;   POLYPECTOMY N/A 02/25/2014   Procedure: POLYPECTOMY;  Surgeon: Florian Buff, MD;  [facility];  Service: Gynecology;  Laterality: N/A;   RESECTION DISTAL CLAVICAL Right 03/26/2015   Procedure: OPEN DISTAL CLAVICAL RESECTION ;  Surgeon: Netta Cedars, MD;  Location: [place];  Service: Orthopedics;  Laterality: Right;    There were no vitals filed for this visit.   Subjective Assessment - 05/25/21 1001     Subjective She had a rough day yesterday. they changed some of her medications.    Currently in Pain? Yes    Pain Score 8     Pain Location Leg    Pain Orientation Right    Pain Descriptors / Indicators Burning    Pain Type Chronic pain    Pain Onset More than a month ago    Pain Frequency Constant                               [facility] Adult PT Treatment/Exercise - 05/25/21 0001  Ambulation/Gait   Ambulation/Gait Yes    Ambulation Distance (Feet) 160 Feet    Assistive device Rolling walker    Gait Comments 2 bouts of 80 feet with seated rest break, limited due to fatigue and LE pain      Lumbar Exercises: Standing   Functional Squats 10 reps    Functional Squats Limitations mini    Other Standing Lumbar Exercises standing march x 3 bilateral    Other Standing Lumbar Exercises standing hip abduction 1x 5 bilateral      Lumbar Exercises: Seated   Long Arc Quad on Chair Both;10 reps    LAQ on Chair Weights (lbs) 3    LAQ on Chair Limitations 3 second holds    Sit to Stand 5 reps    Other Seated Lumbar Exercises marching 10x 3# bilateral                    PT Education - 05/25/21 1001     Education Details HEP    Person(s) Educated Patient    Methods Explanation    Comprehension Verbalized understanding              PT Short Term Goals - 05/16/21 1502       PT SHORT TERM GOAL  #1   Title Patient will be independent with HEP in order to improve functional outcomes.    Baseline states she tries to do exercises daily    Time 4    Period Weeks    Status On-going    Target Date 05/17/21      PT SHORT TERM GOAL #2   Title Patient will be able to ambulate x 50 ft with RW and RPE no greater than 7/10    Baseline 6/10 RPE with ambulation x 78 ft    Period Weeks    Status Achieved    Target Date 05/17/21      PT SHORT TERM GOAL #3   Title Patient will improve standing tolerance to 45 sec while performing dynamic upper body movements (reaching overhead to [employer] tiles) to improve effiiciency and safety with ADL performance    Baseline 14 seconds with no support, 45 seconds with bilateral upper extremity support    Time 4    Period Weeks    Status On-going    Target Date 05/17/21               PT Long Term Goals - 05/16/21 1525       PT LONG TERM GOAL #1   Title Patient will improve standing tolerance to 60 sec while performing dynamic upper body movements (reaching overhead to [employer] tiles) to improve effiiciency and safety with ADL performance    Baseline 14 seconds    Time 8    Period Weeks    Status On-going      PT LONG TERM GOAL #2   Title Patient will be able to ambulate x 50 ft with RW and RPE not exceeding 5/10 to manifest improve ambulation tolerance    Baseline 78 feet with 6/10 RPE    Time 8    Period Weeks    Status On-going                   Plan - 05/25/21 1001     Clinical Impression Statement Patient ambulating 2 bouts today but is limited by c/o LE pain, fatigue, and impaired activity tolerance. Patient requires frequent seated rest breaks for fatigue  following all exercises. Patient with intermittent LE buckling with standing secondary to weakness. Patient requires min g/ assist throughout session. Patient limited by fatigue today. Patient will continue to benefit from skilled physical therapy in order to  reduce impairment and improve function.    Personal Factors and Comorbidities Age;Fitness;Behavior Pattern;Past/Current Experience;Comorbidity 3+;Social Background;Time since onset of injury/illness/exacerbation    Comorbidities CHF, RA, , polymyalgia, sedentary, obesity    Examination-Activity Limitations Locomotion Level;Transfers;Bathing;Bed Mobility;Stand;Stairs;Squat;Lift;Sit;Dressing;Hygiene/Grooming;Carry;Bend;Reach Overhead    Examination-Participation Restrictions Church;Meal Prep;Cleaning;Community Activity;Volunteer;Shop;Yard Work    Stability/Clinical Decision Making Unstable/Unpredictable    Rehab Potential Good    PT Frequency 2x / week    PT Duration 8 weeks    PT Treatment/Interventions Patient/family education;Manual lymph drainage;Compression bandaging;Moist Heat;Traction;Ultrasound;Gait training;Stair training;Functional mobility training;Therapeutic activities;Therapeutic exercise;Balance training;Neuromuscular re-education;Manual techniques;Passive range of motion;Energy conservation;Joint Manipulations;ADLs/Self Care Home Management;Aquatic Therapy;Electrical Stimulation    PT Next Visit Plan continue to progress functional strength and mobility.    PT Home Exercise Plan 5/17:  supine bridge, heelslides, lower trunk rotations; 5/20: sitting tall, scapular retraction 5/24 marching, LAQ, hip add iso; 6/3:  ankle pumps, LAQ, sitting marching, hip ab/adduction, diaphragmic breathing, and lymphatic squeeze.; 6/22:  ab set, sitting tall.    Consulted and Agree with Plan of Care Patient;Family member/caregiver    Family Member Consulted Jessica             Patient will benefit from skilled therapeutic intervention in order to improve the following deficits and impairments:  Decreased endurance, Obesity, Pain, Increased edema, Abnormal gait, Decreased activity tolerance, Decreased balance, Decreased mobility, Decreased strength, Difficulty walking  Visit Diagnosis: Other  abnormalities of gait and mobility  Other symptoms and signs involving the musculoskeletal system  Muscle weakness (generalized)  Bilateral hip pain  Low back pain, unspecified back pain laterality, unspecified chronicity, unspecified whether sciatica present  Pain in both knees, unspecified chronicity     Problem List Patient Active Problem List   Diagnosis Date Noted   Diverticulitis 05/02/2018   Diverticulitis of colon 05/01/2018   Morbid obesity with BMI of 50.0-59.9, adult ([place]) 05/01/2018   Chronic diastolic HF (heart failure) ([place]) 05/01/2018   On prednisone therapy 05/31/2017   Positive anti-CCP test 05/31/2017   Rheumatoid factor positive 05/31/2017   Memory loss 04/04/2017   Polymyalgia rheumatica ([place]) 04/04/2017   High risk medication use 12/07/2016   DDD (degenerative disc disease), lumbar 09/27/2016   HTN (hypertension) 06/05/2016   Chronic pain syndrome 06/05/2016   Acute diverticulitis 06/02/2016   Diverticulitis large intestine 06/02/2016   Spinal stenosis of lumbosacral region 02/29/2016   Joint pain 02/29/2016   Endometrial polyp 02/06/2014   Postmenopausal bleeding 01/30/2014   GERD (gastroesophageal reflux disease) 09/29/2013   Spinal stenosis, thoracic 09/29/2013   Shingles 09/29/2013   Thoracic or lumbosacral neuritis or radiculitis, unspecified 05/04/2011   Abnormality of gait 05/04/2011   Muscle weakness (generalized) 05/04/2011   Bilateral primary osteoarthritis of knee 04/07/2009   KNEE PAIN 04/07/2009    10:35 AM, 05/25/21 Mearl Latin PT, DPT Physical Therapist at [place], [place], [address] Phone: [phone]   Fax:  [phone]  Name: Nicole [name] MRN: [id number] Date of Birth: 1946-05-02

## 2021-05-30 ENCOUNTER — Ambulatory Visit (HOSPITAL_COMMUNITY): Payer: Medicare Other | Admitting: Physical Therapy

## 2021-05-30 ENCOUNTER — Other Ambulatory Visit: Payer: Self-pay

## 2021-05-30 DIAGNOSIS — M6281 Muscle weakness (generalized): Secondary | ICD-10-CM | POA: Diagnosis not present

## 2021-05-30 DIAGNOSIS — R2689 Other abnormalities of gait and mobility: Secondary | ICD-10-CM | POA: Diagnosis not present

## 2021-05-30 DIAGNOSIS — M545 Low back pain, unspecified: Secondary | ICD-10-CM | POA: Diagnosis not present

## 2021-05-30 DIAGNOSIS — M25551 Pain in right hip: Secondary | ICD-10-CM | POA: Diagnosis not present

## 2021-05-30 DIAGNOSIS — R29898 Other symptoms and signs involving the musculoskeletal system: Secondary | ICD-10-CM

## 2021-05-30 DIAGNOSIS — M25552 Pain in left hip: Secondary | ICD-10-CM | POA: Diagnosis not present

## 2021-05-30 NOTE — Therapy (Signed)
[place], [place], [address] Phone: [phone]   Fax:  [phone]  Physical Therapy Treatment  Patient Details  Name: Nicole [name] MRN: [id number] Date of Birth: 07/26/1946 Referring Provider (PT): Ofilia Neas, PA-C   Encounter Date: 05/30/2021    Past Medical History:  Diagnosis Date   AC (acromioclavicular) joint bone spurs    lt shoulder   Anemia    Arthritis    Carpal tunnel syndrome, bilateral    CHF (congestive heart failure) ([place])    Collagen vascular disease ([place])    Gastroesophageal reflux    Headache    recent visit to ER @ [facility] for severe headache   Hypertension    Lumbar stenosis    Hx of ESIs by Dr. Nelva Bush   Polyarthralgia    Polymyalgia [facility])    Shingles    Spinal stenosis     Past Surgical History:  Procedure Laterality Date   BACK SURGERY  07/05/2018, 07/2018   x2    BIOPSY  09/22/2020   Procedure: BIOPSY;  Surgeon: Rogene Houston, MD;  [facility];  Service: Endoscopy;;   CHOLECYSTECTOMY     COLONOSCOPY  06/11/2012   Procedure: COLONOSCOPY;  Surgeon: Jamesetta So, MD;  [facility];  Service: Gastroenterology;  Laterality: N/A;   COLONOSCOPY WITH PROPOFOL N/A 09/22/2020   Procedure: COLONOSCOPY WITH PROPOFOL;  Surgeon: Rogene Houston, MD;  [facility];  Service: Endoscopy;  Laterality: N/A;  730   HYSTEROSCOPY WITH D & C N/A 02/25/2014   Procedure: DILATATION AND CURETTAGE /HYSTEROSCOPY;  Surgeon: Florian Buff, MD;  [facility]: [facility];  Service: Gynecology;  Laterality: N/A;   POLYPECTOMY N/A 02/25/2014   Procedure: POLYPECTOMY;  Surgeon: Florian Buff, MD;  [facility]: [facility];  Service: Gynecology;  Laterality: N/A;   RESECTION DISTAL CLAVICAL Right 03/26/2015   Procedure: OPEN DISTAL CLAVICAL RESECTION ;  Surgeon: Netta Cedars, MD;  Location: [facility];  Service: Orthopedics;  Laterality: Right;    There were no vitals filed for this visit.    Subjective Assessment - 05/30/21 1534     Subjective pt states she really feels no better.  CG reports she has been walking more at home with assistance.    Currently in Pain? Yes    Pain Location Leg    Pain Orientation Right    Pain Descriptors / Indicators Aching;Burning                               [facility] Adult PT Treatment/Exercise - 05/30/21 0001       Ambulation/Gait   Ambulation/Gait Yes    Ambulation Distance (Feet) 190 Feet   3 bouts; 70', 60', 60'   Gait Comments 3 bouts of 70', 60', 60'      Lumbar Exercises: Standing   Other Standing Lumbar Exercises standing march 5X 2 sets bilateral                      PT Short Term Goals - 05/16/21 1502       PT SHORT TERM GOAL #1   Title Patient will be independent with HEP in order to improve functional outcomes.    Baseline states she tries to do exercises daily    Time 4    Period Weeks    Status On-going    Target Date 05/17/21  PT SHORT TERM GOAL #2   Title Patient will be able to ambulate x 50 ft with RW and RPE no greater than 7/10    Baseline 6/10 RPE with ambulation x 78 ft    Period Weeks    Status Achieved    Target Date 05/17/21      PT SHORT TERM GOAL #3   Title Patient will improve standing tolerance to 45 sec while performing dynamic upper body movements (reaching overhead to retrive sticker tiles) to improve effiiciency and safety with ADL performance    Baseline 14 seconds with no support, 45 seconds with bilateral upper extremity support    Time 4    Period Weeks    Status On-going    Target Date 05/17/21               PT Long Term Goals - 05/16/21 1525       PT LONG TERM GOAL #1   Title Patient will improve standing tolerance to 60 sec while performing dynamic upper body movements (reaching overhead to retrive sticker tiles) to improve effiiciency and safety with ADL performance    Baseline 14 seconds    Time 8    Period Weeks    Status On-going       PT LONG TERM GOAL #2   Title Patient will be able to ambulate x 50 ft with RW and RPE not exceeding 5/10 to manifest improve ambulation tolerance    Baseline 78 feet with 6/10 RPE    Time 8    Period Weeks    Status On-going                   Plan - 05/30/21 1530     Clinical Impression Statement Pt reported not feeling much better from last visit but wanting to try.  Ambulated approx. 70 feet before needing rest then 2 additional bouts of 60 feet following that.  Noted with increased difficulty with breathing this session.  Noted that pulse ox at rest was 98-99% but dropped to 86-87% with activity.  Educated on importance of utilizing good breathing techniques to get most oxygen uptake. Pt continues to breathe rapidly and hold her breath at times.  Pt only able to complete 5 reps at a time of marching and after 2nd set c/o chest pain.  BP taken at 158/113 mmHg.  Exercise stopped at this point.   Pt has appt with primary this week and recorded measures for her to discuss at this appointment.    Personal Factors and Comorbidities Age;Fitness;Behavior Pattern;Past/Current Experience;Comorbidity 3+;Social Background;Time since onset of injury/illness/exacerbation    Comorbidities CHF, RA, , polymyalgia, sedentary, obesity    Examination-Activity Limitations Locomotion Level;Transfers;Bathing;Bed Mobility;Stand;Stairs;Squat;Lift;Sit;Dressing;Hygiene/Grooming;Carry;Bend;Reach Overhead    Examination-Participation Restrictions Church;Meal Prep;Cleaning;Community Activity;Volunteer;Shop;Yard Work    Stability/Clinical Decision Making Unstable/Unpredictable    Rehab Potential Good    PT Frequency 2x / week    PT Duration 8 weeks    PT Treatment/Interventions Patient/family education;Manual lymph drainage;Compression bandaging;Moist Heat;Traction;Ultrasound;Gait training;Stair training;Functional mobility training;Therapeutic activities;Therapeutic exercise;Balance training;Neuromuscular  re-education;Manual techniques;Passive range of motion;Energy conservation;Joint Manipulations;ADLs/Self Care Home Management;Aquatic Therapy;Electrical Stimulation    PT Next Visit Plan follow up with MD appointment.  Pt will need oxygen and BP monitored during sessions.    PT Home Exercise Plan 5/17:  supine bridge, heelslides, lower trunk rotations; 5/20: sitting tall, scapular retraction 5/24 marching, LAQ, hip add iso; 6/3:  ankle pumps, LAQ, sitting marching, hip ab/adduction, diaphragmic breathing, and lymphatic squeeze.; 6/22:  ab  set, sitting tall.    Consulted and Agree with Plan of Care Patient;Family member/caregiver    Family Member Consulted Jessica             Patient will benefit from skilled therapeutic intervention in order to improve the following deficits and impairments:  Decreased endurance, Obesity, Pain, Increased edema, Abnormal gait, Decreased activity tolerance, Decreased balance, Decreased mobility, Decreased strength, Difficulty walking  Visit Diagnosis: No diagnosis found.     Problem List Patient Active Problem List   Diagnosis Date Noted   Diverticulitis 05/02/2018   Diverticulitis of colon 05/01/2018   Morbid obesity with BMI of 50.0-59.9, adult ([place]) 05/01/2018   Chronic diastolic HF (heart failure) ([place]) 05/01/2018   On prednisone therapy 05/31/2017   Positive anti-CCP test 05/31/2017   Rheumatoid factor positive 05/31/2017   Memory loss 04/04/2017   Polymyalgia rheumatica ([place]) 04/04/2017   High risk medication use 12/07/2016   DDD (degenerative disc disease), lumbar 09/27/2016   HTN (hypertension) 06/05/2016   Chronic pain syndrome 06/05/2016   Acute diverticulitis 06/02/2016   Diverticulitis large intestine 06/02/2016   Spinal stenosis of lumbosacral region 02/29/2016   Joint pain 02/29/2016   Endometrial polyp 02/06/2014   Postmenopausal bleeding 01/30/2014   GERD (gastroesophageal reflux disease) 09/29/2013   Spinal stenosis, thoracic  09/29/2013   Shingles 09/29/2013   Thoracic or lumbosacral neuritis or radiculitis, unspecified 05/04/2011   Abnormality of gait 05/04/2011   Muscle weakness (generalized) 05/04/2011   Bilateral primary osteoarthritis of knee 04/07/2009   KNEE PAIN 04/07/2009   Teena Irani, PTA/CLT [id number]  Teena Irani 05/30/2021, 3:35 PM  [place], [place], [address] Phone: [phone]   Fax:  [phone]  Name: Nicole [name] MRN: [id number] Date of Birth: [birth year]

## 2021-06-02 ENCOUNTER — Ambulatory Visit (HOSPITAL_COMMUNITY): Payer: Medicare Other | Admitting: Physical Therapy

## 2021-06-02 ENCOUNTER — Other Ambulatory Visit: Payer: Self-pay

## 2021-06-02 DIAGNOSIS — M545 Low back pain, unspecified: Secondary | ICD-10-CM

## 2021-06-02 DIAGNOSIS — M25551 Pain in right hip: Secondary | ICD-10-CM | POA: Diagnosis not present

## 2021-06-02 DIAGNOSIS — R2689 Other abnormalities of gait and mobility: Secondary | ICD-10-CM | POA: Diagnosis not present

## 2021-06-02 DIAGNOSIS — R29898 Other symptoms and signs involving the musculoskeletal system: Secondary | ICD-10-CM

## 2021-06-02 DIAGNOSIS — M25552 Pain in left hip: Secondary | ICD-10-CM

## 2021-06-02 DIAGNOSIS — M6281 Muscle weakness (generalized): Secondary | ICD-10-CM

## 2021-06-02 NOTE — Therapy (Signed)
[place], [place], [address] Phone: [phone]   Fax:  [phone]  Physical Therapy Treatment  Patient Details  Name: Nicole [name] MRN: [id number] Date of Birth: [birth year] Referring Provider (PT): Ofilia Neas, PA-C   Encounter Date: 06/02/2021   PT End of Session - 06/02/21 1159     Visit Number 21    Number of Visits 28    Date for PT Re-Evaluation 06/14/21    Authorization Type Primary Medicare Secondary [employer] (no auth, no VL)    Progress Note Due on Visit 26    PT Start Time 1137    PT Stop Time 1217    PT Time Calculation (min) 40 min             Past Medical History:  Diagnosis Date   AC (acromioclavicular) joint bone spurs    lt shoulder   Anemia    Arthritis    Carpal tunnel syndrome, bilateral    CHF (congestive heart failure) ([facility])    Collagen vascular disease ([place])    Gastroesophageal reflux    Headache    recent visit to ER @ [facility] for severe headache   Hypertension    Lumbar stenosis    Hx of ESIs by Dr. Nelva Bush   Polyarthralgia    Polymyalgia [facility])    Shingles    Spinal stenosis     Past Surgical History:  Procedure Laterality Date   BACK SURGERY  07/05/2018, 07/2018   x2    BIOPSY  09/22/2020   Procedure: BIOPSY;  Surgeon: Rogene Houston, MD;  [facility];  Service: Endoscopy;;   CHOLECYSTECTOMY     COLONOSCOPY  06/11/2012   Procedure: COLONOSCOPY;  Surgeon: Jamesetta So, MD;  [facility];  Service: Gastroenterology;  Laterality: N/A;   COLONOSCOPY WITH PROPOFOL N/A 09/22/2020   Procedure: COLONOSCOPY WITH PROPOFOL;  Surgeon: Rogene Houston, MD;  [facility];  Service: Endoscopy;  Laterality: N/A;  730   HYSTEROSCOPY WITH D & C N/A 02/25/2014   Procedure: DILATATION AND CURETTAGE /HYSTEROSCOPY;  Surgeon: Florian Buff, MD;  [facility];  Service: Gynecology;  Laterality: N/A;   POLYPECTOMY N/A 02/25/2014   Procedure:  POLYPECTOMY;  Surgeon: Florian Buff, MD;  [facility];  Service: Gynecology;  Laterality: N/A;   RESECTION DISTAL CLAVICAL Right 03/26/2015   Procedure: OPEN DISTAL CLAVICAL RESECTION ;  Surgeon: Netta Cedars, MD;  Location: [facility];  Service: Orthopedics;  Laterality: Right;    There were no vitals filed for this visit.   Subjective Assessment - 06/02/21 1139     Subjective Pt states that she was coming to therapy and lost her balance she did not hit the ground she managed to catch the seat of the car.  She is now having pain in her Left arm and right buttock    Patient is accompained by: Family member    Pertinent History Patient is being seen in  physical therapy for  c/o back pain, knee pain and hip pain. Lumbar stenosis with 2 back surgeries.    Patient Stated Goals walk and drive car and lose weight    Currently in Pain? Yes    Pain Score 7     Pain Location Buttocks    Pain Orientation Right    Pain Descriptors / Indicators Aching    Pain Type Chronic pain;Acute pain   has chronic pain but recently  had a near miss fall and landed on her Rt buttock   Pain Onset Today    Aggravating Factors  wt bearing    Pain Relieving Factors not sure    Effect of Pain on Daily Activities limits    Pain Onset More than a month ago                               [facility] Adult PT Treatment/Exercise - 06/02/21 0001       Bed Mobility   Bed Mobility Rolling Right;Rolling Left;Sit to Supine;Supine to Sit      Exercises   Exercises Lumbar      Lumbar Exercises: Supine   Ab Set 15 reps    Glut Set 10 reps    Clam 10 reps    Heel Slides 10 reps    Bent Knee Raise 5 reps      Lumbar Exercises: Sidelying   Other Sidelying Lumbar Exercises hip adduction with pillow x 10    Other Sidelying Lumbar Exercises isometric abduction x 10                      PT Short Term Goals - 05/16/21 1502       PT SHORT TERM GOAL #1   Title Patient will be independent with  HEP in order to improve functional outcomes.    Baseline states she tries to do exercises daily    Time 4    Period Weeks    Status On-going    Target Date 05/17/21      PT SHORT TERM GOAL #2   Title Patient will be able to ambulate x 50 ft with RW and RPE no greater than 7/10    Baseline 6/10 RPE with ambulation x 78 ft    Period Weeks    Status Achieved    Target Date 05/17/21      PT SHORT TERM GOAL #3   Title Patient will improve standing tolerance to 45 sec while performing dynamic upper body movements (reaching overhead to [employer] tiles) to improve effiiciency and safety with ADL performance    Baseline 14 seconds with no support, 45 seconds with bilateral upper extremity support    Time 4    Period Weeks    Status On-going    Target Date 05/17/21               PT Long Term Goals - 05/16/21 1525       PT LONG TERM GOAL #1   Title Patient will improve standing tolerance to 60 sec while performing dynamic upper body movements (reaching overhead to [employer] tiles) to improve effiiciency and safety with ADL performance    Baseline 14 seconds    Time 8    Period Weeks    Status On-going      PT LONG TERM GOAL #2   Title Patient will be able to ambulate x 50 ft with RW and RPE not exceeding 5/10 to manifest improve ambulation tolerance    Baseline 78 feet with 6/10 RPE    Time 8    Period Weeks    Status On-going                   Plan - 06/02/21 1159     Clinical Impression Statement PT continues to have decreased tolerance and increased pain.  Changed treatment to focus more on  core to give pt stability when standing.  PT had difficulty completing all exercsise due to recent near miss fall and increased buttock pain    Personal Factors and Comorbidities Age;Fitness;Behavior Pattern;Past/Current Experience;Comorbidity 3+;Social Background;Time since onset of injury/illness/exacerbation    Comorbidities CHF, RA, , polymyalgia, sedentary,  obesity    Examination-Activity Limitations Locomotion Level;Transfers;Bathing;Bed Mobility;Stand;Stairs;Squat;Lift;Sit;Dressing;Hygiene/Grooming;Carry;Bend;Reach Overhead    Examination-Participation Restrictions Church;Meal Prep;Cleaning;Community Activity;Volunteer;Shop;Yard Work    Stability/Clinical Decision Making Unstable/Unpredictable    Clinical Decision Making Moderate    Rehab Potential Fair    PT Frequency 2x / week    PT Duration 8 weeks    PT Treatment/Interventions Patient/family education;Manual lymph drainage;Compression bandaging;Moist Heat;Traction;Ultrasound;Gait training;Stair training;Functional mobility training;Therapeutic activities;Therapeutic exercise;Balance training;Neuromuscular re-education;Manual techniques;Passive range of motion;Energy conservation;Joint Manipulations;ADLs/Self Care Home Management;Aquatic Therapy;Electrical Stimulation    PT Next Visit Plan continue with stab exercises to see if this makes a difference in pt treatment.    PT Home Exercise Plan 5/17:  supine bridge, heelslides, lower trunk rotations; 5/20: sitting tall, scapular retraction 5/24 marching, LAQ, hip add iso; 6/3:  ankle pumps, LAQ, sitting marching, hip ab/adduction, diaphragmic breathing, and lymphatic squeeze.; 6/22:  ab set, sitting tall.; 8/10:  ab set, glut set, bent knee raise, heel slides,             Patient will benefit from skilled therapeutic intervention in order to improve the following deficits and impairments:  Decreased endurance, Obesity, Pain, Increased edema, Abnormal gait, Decreased activity tolerance, Decreased balance, Decreased mobility, Decreased strength, Difficulty walking  Visit Diagnosis: Other abnormalities of gait and mobility  Other symptoms and signs involving the musculoskeletal system  Muscle weakness (generalized)  Low back pain, unspecified back pain laterality, unspecified chronicity, unspecified whether sciatica present  Bilateral hip  pain     Problem List Patient Active Problem List   Diagnosis Date Noted   Diverticulitis 05/02/2018   Diverticulitis of colon 05/01/2018   Morbid obesity with BMI of 50.0-59.9, adult ([place]) 05/01/2018   Chronic diastolic HF (heart failure) ([place]) 05/01/2018   On prednisone therapy 05/31/2017   Positive anti-CCP test 05/31/2017   Rheumatoid factor positive 05/31/2017   Memory loss 04/04/2017   Polymyalgia rheumatica ([place]) 04/04/2017   High risk medication use 12/07/2016   DDD (degenerative disc disease), lumbar 09/27/2016   HTN (hypertension) 06/05/2016   Chronic pain syndrome 06/05/2016   Acute diverticulitis 06/02/2016   Diverticulitis large intestine 06/02/2016   Spinal stenosis of lumbosacral region 02/29/2016   Joint pain 02/29/2016   Endometrial polyp 02/06/2014   Postmenopausal bleeding 01/30/2014   GERD (gastroesophageal reflux disease) 09/29/2013   Spinal stenosis, thoracic 09/29/2013   Shingles 09/29/2013   Thoracic or lumbosacral neuritis or radiculitis, unspecified 05/04/2011   Abnormality of gait 05/04/2011   Muscle weakness (generalized) 05/04/2011   Bilateral primary osteoarthritis of knee 04/07/2009   KNEE PAIN 04/07/2009   Rayetta Humphrey, PT CLT [phone]  06/02/2021, 12:15 PM  [place], [place], [address] Phone: [phone]   Fax:  [phone]  Name: Nimah Uphoff MRN: [id number] Date of Birth: [birth year]

## 2021-06-07 ENCOUNTER — Encounter (HOSPITAL_COMMUNITY): Payer: Medicare Other

## 2021-06-08 DIAGNOSIS — N1832 Chronic kidney disease, stage 3b: Secondary | ICD-10-CM | POA: Diagnosis not present

## 2021-06-08 DIAGNOSIS — N170 Acute kidney failure with tubular necrosis: Secondary | ICD-10-CM | POA: Diagnosis not present

## 2021-06-08 DIAGNOSIS — E211 Secondary hyperparathyroidism, not elsewhere classified: Secondary | ICD-10-CM | POA: Diagnosis not present

## 2021-06-08 DIAGNOSIS — I5033 Acute on chronic diastolic (congestive) heart failure: Secondary | ICD-10-CM | POA: Diagnosis not present

## 2021-06-08 DIAGNOSIS — I129 Hypertensive chronic kidney disease with stage 1 through stage 4 chronic kidney disease, or unspecified chronic kidney disease: Secondary | ICD-10-CM | POA: Diagnosis not present

## 2021-06-09 ENCOUNTER — Encounter (HOSPITAL_COMMUNITY): Payer: Self-pay | Admitting: Physical Therapy

## 2021-06-09 ENCOUNTER — Other Ambulatory Visit: Payer: Self-pay

## 2021-06-09 ENCOUNTER — Ambulatory Visit (HOSPITAL_COMMUNITY): Payer: Medicare Other | Admitting: Physical Therapy

## 2021-06-09 DIAGNOSIS — R29898 Other symptoms and signs involving the musculoskeletal system: Secondary | ICD-10-CM

## 2021-06-09 DIAGNOSIS — M6281 Muscle weakness (generalized): Secondary | ICD-10-CM | POA: Diagnosis not present

## 2021-06-09 DIAGNOSIS — R2689 Other abnormalities of gait and mobility: Secondary | ICD-10-CM

## 2021-06-09 DIAGNOSIS — M545 Low back pain, unspecified: Secondary | ICD-10-CM | POA: Diagnosis not present

## 2021-06-09 DIAGNOSIS — M25551 Pain in right hip: Secondary | ICD-10-CM | POA: Diagnosis not present

## 2021-06-09 DIAGNOSIS — M25552 Pain in left hip: Secondary | ICD-10-CM | POA: Diagnosis not present

## 2021-06-09 NOTE — Therapy (Signed)
[place], [place], [address] Phone: [phone]   Fax:  [phone]  Physical Therapy Treatment  Patient Details  Name: Nicole [name] MRN: [id number] Date of Birth: [birth year] Referring Provider (PT): Ofilia Neas, PA-C   Encounter Date: 06/09/2021   PT End of Session - 06/09/21 1331     Visit Number 22    Number of Visits 28    Date for PT Re-Evaluation 06/14/21    Authorization Type Primary Medicare Secondary Tricare (no auth, no VL)    Progress Note Due on Visit 26    PT Start Time 1332   late to session   PT Stop Time 1357    PT Time Calculation (min) 25 min    Equipment Utilized During Treatment Gait belt    Activity Tolerance Patient limited by lethargy             Past Medical History:  Diagnosis Date   AC (acromioclavicular) joint bone spurs    lt shoulder   Anemia    Arthritis    Carpal tunnel syndrome, bilateral    CHF (congestive heart failure) (HCC)    Collagen vascular disease ([place])    Gastroesophageal reflux    Headache    recent visit to ER @ [facility] for severe headache   Hypertension    Lumbar stenosis    Hx of ESIs by Dr. Nelva Bush   Polyarthralgia    Polymyalgia [facility])    Shingles    Spinal stenosis     Past Surgical History:  Procedure Laterality Date   BACK SURGERY  07/05/2018, 07/2018   x2    BIOPSY  09/22/2020   Procedure: BIOPSY;  Surgeon: Rogene Houston, MD;  [facility];  Service: Endoscopy;;   CHOLECYSTECTOMY     COLONOSCOPY  06/11/2012   Procedure: COLONOSCOPY;  Surgeon: Jamesetta So, MD;  [facility];  Service: Gastroenterology;  Laterality: N/A;   COLONOSCOPY WITH PROPOFOL N/A 09/22/2020   Procedure: COLONOSCOPY WITH PROPOFOL;  Surgeon: Rogene Houston, MD;  [facility];  Service: Endoscopy;  Laterality: N/A;  730   HYSTEROSCOPY WITH D & C N/A 02/25/2014   Procedure: DILATATION AND CURETTAGE /HYSTEROSCOPY;  Surgeon: Florian Buff, MD;  Location: [facility];  Service: Gynecology;  Laterality: N/A;   POLYPECTOMY N/A 02/25/2014   Procedure: POLYPECTOMY;  Surgeon: Florian Buff, MD;  Location: [facility];  Service: Gynecology;  Laterality: N/A;   RESECTION DISTAL CLAVICAL Right 03/26/2015   Procedure: OPEN DISTAL CLAVICAL RESECTION ;  Surgeon: Netta Cedars, MD;  Location: [facility];  Service: Orthopedics;  Laterality: Right;    There were no vitals filed for this visit.   Subjective Assessment - 06/09/21 1348     Subjective Patient reports her buttocks pain is better. States they forgot to take her BP this morning. States her current pain is 7/10 in her buttocks.    Patient is accompained by: Family member    Pertinent History Patient is being seen in  physical therapy for  c/o back pain, knee pain and hip pain. Lumbar stenosis with 2 back surgeries.    Patient Stated Goals walk and drive car and lose weight    Currently in Pain? Yes    Pain Score 7     Pain Location Buttocks    Pain Orientation Right    Pain Onset Today    Pain Onset More than a month ago  [facility] PT Assessment - 06/09/21 0001       Assessment   Medical Diagnosis knee OA, DDD    Referring Provider (PT) Ofilia Neas, PA-C                    At start of session 162/110 HR 107  After marching in place for < 1 minutes 179/108 HR 116       [facility] Adult PT Treatment/Exercise - 06/09/21 0001       Lumbar Exercises: Standing   Other Standing Lumbar Exercises standing x4 with up to 5 steps in place B - B UE support      Lumbar Exercises: Seated   Sit to Stand 5 reps                    PT Education - 06/09/21 1348     Education Details on BP readings, on execise, on drinking water and staying hydrated on risks associated with elevated BP, on signs and symptoms of elevated BP and to go to ER if symptoms present/persist    Person(s) Educated Patient    Methods Explanation    Comprehension Verbalized  understanding              PT Short Term Goals - 05/16/21 1502       PT SHORT TERM GOAL #1   Title Patient will be independent with HEP in order to improve functional outcomes.    Baseline states she tries to do exercises daily    Time 4    Period Weeks    Status On-going    Target Date 05/17/21      PT SHORT TERM GOAL #2   Title Patient will be able to ambulate x 50 ft with RW and RPE no greater than 7/10    Baseline 6/10 RPE with ambulation x 78 ft    Period Weeks    Status Achieved    Target Date 05/17/21      PT SHORT TERM GOAL #3   Title Patient will improve standing tolerance to 45 sec while performing dynamic upper body movements (reaching overhead to [employer] tiles) to improve effiiciency and safety with ADL performance    Baseline 14 seconds with no support, 45 seconds with bilateral upper extremity support    Time 4    Period Weeks    Status On-going    Target Date 05/17/21               PT Long Term Goals - 05/16/21 1525       PT LONG TERM GOAL #1   Title Patient will improve standing tolerance to 60 sec while performing dynamic upper body movements (reaching overhead to [employer] tiles) to improve effiiciency and safety with ADL performance    Baseline 14 seconds    Time 8    Period Weeks    Status On-going      PT LONG TERM GOAL #2   Title Patient will be able to ambulate x 50 ft with RW and RPE not exceeding 5/10 to manifest improve ambulation tolerance    Baseline 78 feet with 6/10 RPE    Time 8    Period Weeks    Status On-going                   Plan - 06/09/21 1359     Clinical Impression Statement Session limited secondary to late arrival. Increased BP noted and at  end of session patient reported symptoms of nausea and blurred vision. These abated but educated patient about symptoms and what to do if they return/do not resolve. Will continue with current POC as tolerated.    Personal Factors and Comorbidities  Age;Fitness;Behavior Pattern;Past/Current Experience;Comorbidity 3+;Social Background;Time since onset of injury/illness/exacerbation    Comorbidities CHF, RA, , polymyalgia, sedentary, obesity    Examination-Activity Limitations Locomotion Level;Transfers;Bathing;Bed Mobility;Stand;Stairs;Squat;Lift;Sit;Dressing;Hygiene/Grooming;Carry;Bend;Reach Overhead    Examination-Participation Restrictions Church;Meal Prep;Cleaning;Community Activity;Volunteer;Shop;Yard Work    Stability/Clinical Decision Making Unstable/Unpredictable    Rehab Potential Fair    PT Frequency 2x / week    PT Duration 8 weeks    PT Treatment/Interventions Patient/family education;Manual lymph drainage;Compression bandaging;Moist Heat;Traction;Ultrasound;Gait training;Stair training;Functional mobility training;Therapeutic activities;Therapeutic exercise;Balance training;Neuromuscular re-education;Manual techniques;Passive range of motion;Energy conservation;Joint Manipulations;ADLs/Self Care Home Management;Aquatic Therapy;Electrical Stimulation    PT Next Visit Plan continue with stab exercises to see if this makes a difference in pt treatment.    PT Home Exercise Plan 5/17:  supine bridge, heelslides, lower trunk rotations; 5/20: sitting tall, scapular retraction 5/24 marching, LAQ, hip add iso; 6/3:  ankle pumps, LAQ, sitting marching, hip ab/adduction, diaphragmic breathing, and lymphatic squeeze.; 6/22:  ab set, sitting tall.; 8/10:  ab set, glut set, bent knee raise, heel slides,             Patient will benefit from skilled therapeutic intervention in order to improve the following deficits and impairments:  Decreased endurance, Obesity, Pain, Increased edema, Abnormal gait, Decreased activity tolerance, Decreased balance, Decreased mobility, Decreased strength, Difficulty walking  Visit Diagnosis: Other abnormalities of gait and mobility  Other symptoms and signs involving the musculoskeletal system  Muscle  weakness (generalized)     Problem List Patient Active Problem List   Diagnosis Date Noted   Diverticulitis 05/02/2018   Diverticulitis of colon 05/01/2018   Morbid obesity with BMI of 50.0-59.9, adult ([place]) 05/01/2018   Chronic diastolic HF (heart failure) ([place]) 05/01/2018   On prednisone therapy 05/31/2017   Positive anti-CCP test 05/31/2017   Rheumatoid factor positive 05/31/2017   Memory loss 04/04/2017   Polymyalgia rheumatica ([place]) 04/04/2017   High risk medication use 12/07/2016   DDD (degenerative disc disease), lumbar 09/27/2016   HTN (hypertension) 06/05/2016   Chronic pain syndrome 06/05/2016   Acute diverticulitis 06/02/2016   Diverticulitis large intestine 06/02/2016   Spinal stenosis of lumbosacral region 02/29/2016   Joint pain 02/29/2016   Endometrial polyp 02/06/2014   Postmenopausal bleeding 01/30/2014   GERD (gastroesophageal reflux disease) 09/29/2013   Spinal stenosis, thoracic 09/29/2013   Shingles 09/29/2013   Thoracic or lumbosacral neuritis or radiculitis, unspecified 05/04/2011   Abnormality of gait 05/04/2011   Muscle weakness (generalized) 05/04/2011   Bilateral primary osteoarthritis of knee 04/07/2009   KNEE PAIN 04/07/2009   2:01 PM, 06/09/21 Jerene Pitch, DPT Physical Therapy with [facility]  [phone] office   [place], [place], [address] Phone: [phone]   Fax:  [phone]  Name: Nicole [name] MRN: [id number] Date of Birth: 08-May-1946

## 2021-06-10 DIAGNOSIS — R7309 Other abnormal glucose: Secondary | ICD-10-CM | POA: Diagnosis not present

## 2021-06-10 DIAGNOSIS — M25512 Pain in left shoulder: Secondary | ICD-10-CM | POA: Diagnosis not present

## 2021-06-10 DIAGNOSIS — Z6841 Body Mass Index (BMI) 40.0 and over, adult: Secondary | ICD-10-CM | POA: Diagnosis not present

## 2021-06-10 DIAGNOSIS — Z0001 Encounter for general adult medical examination with abnormal findings: Secondary | ICD-10-CM | POA: Diagnosis not present

## 2021-06-10 DIAGNOSIS — N170 Acute kidney failure with tubular necrosis: Secondary | ICD-10-CM | POA: Diagnosis not present

## 2021-06-10 DIAGNOSIS — E211 Secondary hyperparathyroidism, not elsewhere classified: Secondary | ICD-10-CM | POA: Diagnosis not present

## 2021-06-10 DIAGNOSIS — N1832 Chronic kidney disease, stage 3b: Secondary | ICD-10-CM | POA: Diagnosis not present

## 2021-06-10 DIAGNOSIS — E559 Vitamin D deficiency, unspecified: Secondary | ICD-10-CM | POA: Diagnosis not present

## 2021-06-10 DIAGNOSIS — Z1331 Encounter for screening for depression: Secondary | ICD-10-CM | POA: Diagnosis not present

## 2021-06-10 DIAGNOSIS — I129 Hypertensive chronic kidney disease with stage 1 through stage 4 chronic kidney disease, or unspecified chronic kidney disease: Secondary | ICD-10-CM | POA: Diagnosis not present

## 2021-06-10 DIAGNOSIS — I10 Essential (primary) hypertension: Secondary | ICD-10-CM | POA: Diagnosis not present

## 2021-06-14 ENCOUNTER — Encounter (HOSPITAL_COMMUNITY): Payer: Self-pay | Admitting: Physical Therapy

## 2021-06-14 ENCOUNTER — Other Ambulatory Visit: Payer: Self-pay

## 2021-06-14 ENCOUNTER — Ambulatory Visit (HOSPITAL_COMMUNITY): Payer: Medicare Other | Admitting: Physical Therapy

## 2021-06-14 DIAGNOSIS — R29898 Other symptoms and signs involving the musculoskeletal system: Secondary | ICD-10-CM | POA: Diagnosis not present

## 2021-06-14 DIAGNOSIS — M545 Low back pain, unspecified: Secondary | ICD-10-CM

## 2021-06-14 DIAGNOSIS — M6281 Muscle weakness (generalized): Secondary | ICD-10-CM

## 2021-06-14 DIAGNOSIS — M25551 Pain in right hip: Secondary | ICD-10-CM

## 2021-06-14 DIAGNOSIS — M25552 Pain in left hip: Secondary | ICD-10-CM

## 2021-06-14 DIAGNOSIS — R2689 Other abnormalities of gait and mobility: Secondary | ICD-10-CM

## 2021-06-14 DIAGNOSIS — M25562 Pain in left knee: Secondary | ICD-10-CM

## 2021-06-14 NOTE — Therapy (Signed)
[facility] [address] [place], [place], [address] Phone: [phone]   Fax:  [phone]  Physical Therapy Treatment/Discharge Summary  Patient Details  Name: Nicole [name] MRN: [id number] Date of Birth: 12-24-1945 Referring Provider (PT): Ofilia Neas, PA-C   Encounter Date: 06/14/2021  PHYSICAL THERAPY DISCHARGE SUMMARY  Visits from Start of Care: 23  Current functional level related to goals / functional outcomes: See below   Remaining deficits: See below   Education / Equipment: See  Below   Patient agrees to discharge. Patient goals were partially met. Patient is being discharged due to maximized rehab potential.     PT End of Session - 06/14/21 1438     Visit Number 23    Number of Visits 28    Date for PT Re-Evaluation 06/14/21    Authorization Type Primary Medicare Secondary Tricare (no auth, no VL)    Progress Note Due on Visit 26    PT Start Time 1440    PT Stop Time 1527    PT Time Calculation (min) 47 min    Equipment Utilized During Treatment Gait belt    Activity Tolerance Patient limited by fatigue    Behavior During Therapy WFL for tasks assessed/performed             Past Medical History:  Diagnosis Date   AC (acromioclavicular) joint bone spurs    lt shoulder   Anemia    Arthritis    Carpal tunnel syndrome, bilateral    CHF (congestive heart failure) (HCC)    Collagen vascular disease ([place])    Gastroesophageal reflux    Headache    recent visit to ER @ [facility] for severe headache   Hypertension    Lumbar stenosis    Hx of ESIs by Dr. Nelva Bush   Polyarthralgia    Polymyalgia [facility])    Shingles    Spinal stenosis     Past Surgical History:  Procedure Laterality Date   BACK SURGERY  07/05/2018, 07/2018   x2    BIOPSY  09/22/2020   Procedure: BIOPSY;  Surgeon: Rogene Houston, MD;  [facility];  Service: Endoscopy;;   CHOLECYSTECTOMY     COLONOSCOPY  06/11/2012    Procedure: COLONOSCOPY;  Surgeon: Jamesetta So, MD;  [facility];  Service: Gastroenterology;  Laterality: N/A;   COLONOSCOPY WITH PROPOFOL N/A 09/22/2020   Procedure: COLONOSCOPY WITH PROPOFOL;  Surgeon: Rogene Houston, MD;  [facility];  Service: Endoscopy;  Laterality: N/A;  730   HYSTEROSCOPY WITH D & C N/A 02/25/2014   Procedure: DILATATION AND CURETTAGE /HYSTEROSCOPY;  Surgeon: Florian Buff, MD;  Location[facility];  Service: Gynecology;  Laterality: N/A;   POLYPECTOMY N/A 02/25/2014   Procedure: POLYPECTOMY;  Surgeon: Florian Buff, MD;  Location: [facility];  Service: Gynecology;  Laterality: N/A;   RESECTION DISTAL CLAVICAL Right 03/26/2015   Procedure: OPEN DISTAL CLAVICAL RESECTION ;  Surgeon: Netta Cedars, MD;  Location: [facility];  Service: Orthopedics;  Laterality: Right;    There were no vitals filed for this visit.   Subjective Assessment - 06/14/21 1440     Subjective Patient states she is beginning to engage with her HEP a little more. She has been walking more with family. No falls recently. Continues to feel weakness.    Patient is accompained by: Family member    Pertinent History Patient is being seen in  physical therapy for  c/o back pain, knee pain and hip pain. Lumbar stenosis with 2 back surgeries.    Patient Stated Goals walk and drive car and lose weight    Currently in Pain? Yes    Pain Location Ankle    Pain Orientation Right    Pain Descriptors / Indicators Burning    Pain Onset Today    Pain Frequency Constant    Pain Onset More than a month ago                [facility] PT Assessment - 06/14/21 0001       Assessment   Medical Diagnosis knee OA, DDD    Referring Provider (PT) Ofilia Neas, PA-C    Prior Therapy none      Precautions   Precautions Fall      Restrictions   Weight Bearing Restrictions No      Balance Screen   Has the patient fallen in the past 6 months No    Has the patient had a decrease in activity level  because of a fear of falling?  No    Is the patient reluctant to leave their home because of a fear of falling?  No      Prior Function   Level of Independence Independent with homemaking with wheelchair      Cognition   Overall Cognitive Status Within Functional Limits for tasks assessed      Observation/Other Assessments   Observations uses WC for mobility, L AFO    Focus on Therapeutic Outcomes (FOTO)  n/a      Strength   Right Hip Flexion 3/5    Left Hip Flexion 2+/5    Right Knee Flexion 4-/5    Right Knee Extension 4-/5    Left Knee Flexion 4-/5    Left Knee Extension 4-/5    Right Ankle Dorsiflexion 4/5    Left Ankle Dorsiflexion 2/5      Ambulation/Gait   Ambulation/Gait Yes    Ambulation Distance (Feet) 70 Feet    Assistive device Rolling walker    Gait Pattern Decreased stride length;Trunk flexed;Poor foot clearance - left;Poor foot clearance - right    Ambulation Surface Level;Indoor    Gait Comments 1 bout 70 feet with RW, RPE 5/10 at 50 feet                                   PT Education - 06/14/21 1439     Education Details HEP, reassessment findings, objectives of PT, beginning walking/standing program with family, consistancy of exercise for progress, returning to PT if needed following further progress, how to progress properly    Person(s) Educated Patient;Caregiver(s)    Methods Explanation    Comprehension Verbalized understanding              PT Short Term Goals - 06/14/21 1449       PT SHORT TERM GOAL #1   Title Patient will be independent with HEP in order to improve functional outcomes.    Baseline states she tries to do exercises daily, working on getting better with them    Time 4    Period Weeks    Status Not Met    Target Date 05/17/21      PT SHORT TERM GOAL #2   Title Patient will be able to ambulate x 50 ft with RW and RPE no greater than 7/10  Baseline 6/10 RPE with ambulation x 78 ft    Period  Weeks    Status Achieved    Target Date 05/17/21      PT SHORT TERM GOAL #3   Title Patient will improve standing tolerance to 45 sec while performing dynamic upper body movements (reaching overhead to retrive sticker tiles) to improve effiiciency and safety with ADL performance    Baseline 14 seconds with no support, 45 seconds with bilateral upper extremity support; 8/23 20 seconds wthout UE support, 55 seconds with UE support    Time 4    Period Weeks    Status Not Met    Target Date 05/17/21               PT Long Term Goals - 06/14/21 1449       PT LONG TERM GOAL #1   Title Patient will improve standing tolerance to 60 sec while performing dynamic upper body movements (reaching overhead to retrive sticker tiles) to improve effiiciency and safety with ADL performance    Baseline 14 seconds 8/23 20 seconds wthout UE support, 55 seconds with UE support    Time 8    Period Weeks    Status Not Met      PT LONG TERM GOAL #2   Title Patient will be able to ambulate x 50 ft with RW and RPE not exceeding 5/10 to manifest improve ambulation tolerance    Baseline 78 feet with 6/10 RPE 8/23 70 feet with RPE >5    Time 8    Period Weeks    Status Not Met                   Plan - 06/14/21 1438     Clinical Impression Statement Patient began physical therapy in May 2022. Patient has met 1/3 short term goals and 0/2 long term goals with improved ambulation ability. Remaining goals not met due to inconstant performance of HEP despite frequent education on performance for improving functional ability. Good progress also toward remaining goals with improving standing ability, ambulation ability, and strength but patient continues to remain limited at this time. Patient and caregiver, Janett Billow, educated on patient's progress and current functional status. Educated them on importance of completing exercises, ambulation, and standing while at home in order to further improve functional  status and ability. Discussed with them on making further progress as home and returning further deficits emerge. Patient discharged from skilled physical therapy at this time.    Personal Factors and Comorbidities Age;Fitness;Behavior Pattern;Past/Current Experience;Comorbidity 3+;Social Background;Time since onset of injury/illness/exacerbation    Comorbidities CHF, RA, , polymyalgia, sedentary, obesity    Examination-Activity Limitations Locomotion Level;Transfers;Bathing;Bed Mobility;Stand;Stairs;Squat;Lift;Sit;Dressing;Hygiene/Grooming;Carry;Bend;Reach Overhead    Examination-Participation Restrictions Church;Meal Prep;Cleaning;Community Activity;Volunteer;Shop;Yard Work    Stability/Clinical Decision Making Unstable/Unpredictable    Rehab Potential Fair    PT Frequency --    PT Duration --    PT Treatment/Interventions Patient/family education;Manual lymph drainage;Compression bandaging;Moist Heat;Traction;Ultrasound;Gait training;Stair training;Functional mobility training;Therapeutic activities;Therapeutic exercise;Balance training;Neuromuscular re-education;Manual techniques;Passive range of motion;Energy conservation;Joint Manipulations;ADLs/Self Care Home Management;Aquatic Therapy;Electrical Stimulation    PT Next Visit Plan n/a    PT Home Exercise Plan 5/17:  supine bridge, heelslides, lower trunk rotations; 5/20: sitting tall, scapular retraction 5/24 marching, LAQ, hip add iso; 6/3:  ankle pumps, LAQ, sitting marching, hip ab/adduction, diaphragmic breathing, and lymphatic squeeze.; 6/22:  ab set, sitting tall.; 8/10:  ab set, glut set, bent knee raise, heel slides,    Consulted and Agree with Plan  of Care Patient;Family member/caregiver    Family Member Consulted Jessica             Patient will benefit from skilled therapeutic intervention in order to improve the following deficits and impairments:  Decreased endurance, Obesity, Pain, Increased edema, Abnormal gait, Decreased  activity tolerance, Decreased balance, Decreased mobility, Decreased strength, Difficulty walking  Visit Diagnosis: Other abnormalities of gait and mobility  Other symptoms and signs involving the musculoskeletal system  Muscle weakness (generalized)  Low back pain, unspecified back pain laterality, unspecified chronicity, unspecified whether sciatica present  Bilateral hip pain  Pain in both knees, unspecified chronicity     Problem List Patient Active Problem List   Diagnosis Date Noted   Diverticulitis 05/02/2018   Diverticulitis of colon 05/01/2018   Morbid obesity with BMI of 50.0-59.9, adult ([place]) 05/01/2018   Chronic diastolic HF (heart failure) ([place]) 05/01/2018   On prednisone therapy 05/31/2017   Positive anti-CCP test 05/31/2017   Rheumatoid factor positive 05/31/2017   Memory loss 04/04/2017   Polymyalgia rheumatica ([place]) 04/04/2017   High risk medication use 12/07/2016   DDD (degenerative disc disease), lumbar 09/27/2016   HTN (hypertension) 06/05/2016   Chronic pain syndrome 06/05/2016   Acute diverticulitis 06/02/2016   Diverticulitis large intestine 06/02/2016   Spinal stenosis of lumbosacral region 02/29/2016   Joint pain 02/29/2016   Endometrial polyp 02/06/2014   Postmenopausal bleeding 01/30/2014   GERD (gastroesophageal reflux disease) 09/29/2013   Spinal stenosis, thoracic 09/29/2013   Shingles 09/29/2013   Thoracic or lumbosacral neuritis or radiculitis, unspecified 05/04/2011   Abnormality of gait 05/04/2011   Muscle weakness (generalized) 05/04/2011   Bilateral primary osteoarthritis of knee 04/07/2009   KNEE PAIN 04/07/2009    3:46 PM, 06/14/21 Mearl Latin PT, DPT Physical Therapist at [facility] [place], [place], [address] Phone: [phone]   Fax:  [phone]  Name: Nicole [name] MRN: [id number] Date of Birth: April 08, 1946

## 2021-06-16 ENCOUNTER — Ambulatory Visit (HOSPITAL_COMMUNITY): Payer: Medicare Other | Admitting: Physical Therapy

## 2021-06-16 ENCOUNTER — Telehealth: Payer: Self-pay | Admitting: *Deleted

## 2021-06-16 DIAGNOSIS — M170 Bilateral primary osteoarthritis of knee: Secondary | ICD-10-CM

## 2021-06-16 DIAGNOSIS — M25552 Pain in left hip: Secondary | ICD-10-CM

## 2021-06-16 DIAGNOSIS — M5136 Other intervertebral disc degeneration, lumbar region: Secondary | ICD-10-CM

## 2021-06-16 NOTE — Telephone Encounter (Signed)
Patient states she was in physical therapy. Patient states they stopped it and she would like to have a new referral to extend her physical therapy. Please advise.

## 2021-06-16 NOTE — Telephone Encounter (Signed)
Spoke with patient and she is open to trying a new physical therapy place.

## 2021-06-16 NOTE — Telephone Encounter (Signed)
We received a discharge summary from her physical therapist on 06/14/21.  It appears she was not making any further progress in PT.  Please see if she is open to trying a new physical therapy place.

## 2021-06-16 NOTE — Telephone Encounter (Signed)
She lives in [place] so a PT place close to home would be best since she will need someone to drive her.

## 2021-06-17 NOTE — Addendum Note (Signed)
Addended by: Carole Binning on: 06/17/2021 08:50 AM   Modules accepted: Orders

## 2021-06-17 NOTE — Telephone Encounter (Signed)
Referral placed.

## 2021-06-21 DIAGNOSIS — M48061 Spinal stenosis, lumbar region without neurogenic claudication: Secondary | ICD-10-CM | POA: Diagnosis not present

## 2021-06-21 DIAGNOSIS — I10 Essential (primary) hypertension: Secondary | ICD-10-CM | POA: Diagnosis not present

## 2021-06-21 DIAGNOSIS — E876 Hypokalemia: Secondary | ICD-10-CM | POA: Diagnosis not present

## 2021-06-21 DIAGNOSIS — M47817 Spondylosis without myelopathy or radiculopathy, lumbosacral region: Secondary | ICD-10-CM | POA: Diagnosis not present

## 2021-06-21 DIAGNOSIS — M79605 Pain in left leg: Secondary | ICD-10-CM | POA: Diagnosis not present

## 2021-06-21 DIAGNOSIS — M79652 Pain in left thigh: Secondary | ICD-10-CM | POA: Diagnosis not present

## 2021-06-21 DIAGNOSIS — Z9889 Other specified postprocedural states: Secondary | ICD-10-CM | POA: Diagnosis not present

## 2021-06-21 DIAGNOSIS — M4807 Spinal stenosis, lumbosacral region: Secondary | ICD-10-CM | POA: Diagnosis not present

## 2021-06-21 DIAGNOSIS — M1712 Unilateral primary osteoarthritis, left knee: Secondary | ICD-10-CM | POA: Diagnosis not present

## 2021-06-21 DIAGNOSIS — M545 Low back pain, unspecified: Secondary | ICD-10-CM | POA: Diagnosis not present

## 2021-06-21 DIAGNOSIS — G8929 Other chronic pain: Secondary | ICD-10-CM | POA: Diagnosis not present

## 2021-06-21 DIAGNOSIS — M1612 Unilateral primary osteoarthritis, left hip: Secondary | ICD-10-CM | POA: Diagnosis not present

## 2021-06-21 DIAGNOSIS — M25462 Effusion, left knee: Secondary | ICD-10-CM | POA: Diagnosis not present

## 2021-06-21 DIAGNOSIS — M47816 Spondylosis without myelopathy or radiculopathy, lumbar region: Secondary | ICD-10-CM | POA: Diagnosis not present

## 2021-06-21 DIAGNOSIS — M549 Dorsalgia, unspecified: Secondary | ICD-10-CM | POA: Diagnosis not present

## 2021-06-21 DIAGNOSIS — Z9049 Acquired absence of other specified parts of digestive tract: Secondary | ICD-10-CM | POA: Diagnosis not present

## 2021-06-21 DIAGNOSIS — M2548 Effusion, other site: Secondary | ICD-10-CM | POA: Diagnosis not present

## 2021-06-21 DIAGNOSIS — M5136 Other intervertebral disc degeneration, lumbar region: Secondary | ICD-10-CM | POA: Diagnosis not present

## 2021-06-21 DIAGNOSIS — R0689 Other abnormalities of breathing: Secondary | ICD-10-CM | POA: Diagnosis not present

## 2021-06-21 DIAGNOSIS — I700 Atherosclerosis of aorta: Secondary | ICD-10-CM | POA: Diagnosis not present

## 2021-06-21 DIAGNOSIS — M4804 Spinal stenosis, thoracic region: Secondary | ICD-10-CM | POA: Diagnosis not present

## 2021-06-21 DIAGNOSIS — R1032 Left lower quadrant pain: Secondary | ICD-10-CM | POA: Diagnosis not present

## 2021-06-21 DIAGNOSIS — R0902 Hypoxemia: Secondary | ICD-10-CM | POA: Diagnosis not present

## 2021-06-22 DIAGNOSIS — R279 Unspecified lack of coordination: Secondary | ICD-10-CM | POA: Diagnosis not present

## 2021-06-22 DIAGNOSIS — Z7401 Bed confinement status: Secondary | ICD-10-CM | POA: Diagnosis not present

## 2021-06-22 DIAGNOSIS — R52 Pain, unspecified: Secondary | ICD-10-CM | POA: Diagnosis not present

## 2021-06-22 DIAGNOSIS — R0902 Hypoxemia: Secondary | ICD-10-CM | POA: Diagnosis not present

## 2021-06-23 DIAGNOSIS — M069 Rheumatoid arthritis, unspecified: Secondary | ICD-10-CM | POA: Diagnosis not present

## 2021-06-23 DIAGNOSIS — R52 Pain, unspecified: Secondary | ICD-10-CM | POA: Diagnosis not present

## 2021-06-23 DIAGNOSIS — M48061 Spinal stenosis, lumbar region without neurogenic claudication: Secondary | ICD-10-CM | POA: Diagnosis not present

## 2021-06-24 ENCOUNTER — Telehealth: Payer: Self-pay

## 2021-06-24 DIAGNOSIS — G8929 Other chronic pain: Secondary | ICD-10-CM | POA: Diagnosis not present

## 2021-06-24 DIAGNOSIS — M47817 Spondylosis without myelopathy or radiculopathy, lumbosacral region: Secondary | ICD-10-CM | POA: Diagnosis not present

## 2021-06-24 DIAGNOSIS — G629 Polyneuropathy, unspecified: Secondary | ICD-10-CM | POA: Diagnosis not present

## 2021-06-24 DIAGNOSIS — M4804 Spinal stenosis, thoracic region: Secondary | ICD-10-CM | POA: Diagnosis not present

## 2021-06-24 DIAGNOSIS — M159 Polyosteoarthritis, unspecified: Secondary | ICD-10-CM | POA: Diagnosis not present

## 2021-06-24 DIAGNOSIS — Z79891 Long term (current) use of opiate analgesic: Secondary | ICD-10-CM | POA: Diagnosis not present

## 2021-06-24 DIAGNOSIS — M069 Rheumatoid arthritis, unspecified: Secondary | ICD-10-CM | POA: Diagnosis not present

## 2021-06-24 DIAGNOSIS — M48061 Spinal stenosis, lumbar region without neurogenic claudication: Secondary | ICD-10-CM | POA: Diagnosis not present

## 2021-06-24 DIAGNOSIS — Z9181 History of falling: Secondary | ICD-10-CM | POA: Diagnosis not present

## 2021-06-24 DIAGNOSIS — E785 Hyperlipidemia, unspecified: Secondary | ICD-10-CM | POA: Diagnosis not present

## 2021-06-24 DIAGNOSIS — E876 Hypokalemia: Secondary | ICD-10-CM | POA: Diagnosis not present

## 2021-06-24 DIAGNOSIS — M5136 Other intervertebral disc degeneration, lumbar region: Secondary | ICD-10-CM | POA: Diagnosis not present

## 2021-06-24 DIAGNOSIS — M21372 Foot drop, left foot: Secondary | ICD-10-CM | POA: Diagnosis not present

## 2021-06-24 DIAGNOSIS — I509 Heart failure, unspecified: Secondary | ICD-10-CM | POA: Diagnosis not present

## 2021-06-24 NOTE — Telephone Encounter (Signed)
Patient states she was seen in the emergency room for pain in her lower back and thigh. Patient states she is still having pain. Patient states she has been flat on her back for about 1 week. Patient states the emergency room was unable to advise her what was causing all the pain. Patient states she is unable to sit up in a chair. Patient states they are talking about putting her in home care therapy or possibly putting her in a rehab facility. Patient was due to take the [facility] on 06/21/2021,. Patient states she was not placed on any antibiotics. Patient states she had her daughter give her the injection.

## 2021-06-24 NOTE — Telephone Encounter (Signed)
She should follow up with her neurosurgeon and pain management specialist if her discomfort persists or worsens.    Please advise the patient to hold kevazara in the future if she develops signs or symptoms of an infection and to resume once the infection has completely cleared.

## 2021-06-24 NOTE — Telephone Encounter (Signed)
Patient left a voicemail stating she was due to take her Kevzara injection on Tuesday, 06/21/21, but was sick and taken to the ER.  Patient requested a return call to let her know if she should take her injection today.

## 2021-06-24 NOTE — Telephone Encounter (Signed)
Spoke with patient's son and advised patient should follow up with her neurosurgeon and pain management specialist if her discomfort persists or worsens.     Advisd patient to hold kevazara in the future if she develops signs or symptoms of an infection and to resume once the infection has completely cleared.

## 2021-06-28 DIAGNOSIS — M4804 Spinal stenosis, thoracic region: Secondary | ICD-10-CM | POA: Diagnosis not present

## 2021-06-28 DIAGNOSIS — G8929 Other chronic pain: Secondary | ICD-10-CM | POA: Diagnosis not present

## 2021-06-28 DIAGNOSIS — M47817 Spondylosis without myelopathy or radiculopathy, lumbosacral region: Secondary | ICD-10-CM | POA: Diagnosis not present

## 2021-06-28 DIAGNOSIS — M48061 Spinal stenosis, lumbar region without neurogenic claudication: Secondary | ICD-10-CM | POA: Diagnosis not present

## 2021-06-28 DIAGNOSIS — E876 Hypokalemia: Secondary | ICD-10-CM | POA: Diagnosis not present

## 2021-06-28 DIAGNOSIS — M5136 Other intervertebral disc degeneration, lumbar region: Secondary | ICD-10-CM | POA: Diagnosis not present

## 2021-06-29 DIAGNOSIS — M48061 Spinal stenosis, lumbar region without neurogenic claudication: Secondary | ICD-10-CM | POA: Diagnosis not present

## 2021-06-29 DIAGNOSIS — M4804 Spinal stenosis, thoracic region: Secondary | ICD-10-CM | POA: Diagnosis not present

## 2021-06-29 DIAGNOSIS — M5136 Other intervertebral disc degeneration, lumbar region: Secondary | ICD-10-CM | POA: Diagnosis not present

## 2021-06-29 DIAGNOSIS — M47817 Spondylosis without myelopathy or radiculopathy, lumbosacral region: Secondary | ICD-10-CM | POA: Diagnosis not present

## 2021-06-29 DIAGNOSIS — E876 Hypokalemia: Secondary | ICD-10-CM | POA: Diagnosis not present

## 2021-06-29 DIAGNOSIS — G8929 Other chronic pain: Secondary | ICD-10-CM | POA: Diagnosis not present

## 2021-06-30 DIAGNOSIS — M4804 Spinal stenosis, thoracic region: Secondary | ICD-10-CM | POA: Diagnosis not present

## 2021-06-30 DIAGNOSIS — M47817 Spondylosis without myelopathy or radiculopathy, lumbosacral region: Secondary | ICD-10-CM | POA: Diagnosis not present

## 2021-06-30 DIAGNOSIS — M48061 Spinal stenosis, lumbar region without neurogenic claudication: Secondary | ICD-10-CM | POA: Diagnosis not present

## 2021-06-30 DIAGNOSIS — M5136 Other intervertebral disc degeneration, lumbar region: Secondary | ICD-10-CM | POA: Diagnosis not present

## 2021-06-30 DIAGNOSIS — G8929 Other chronic pain: Secondary | ICD-10-CM | POA: Diagnosis not present

## 2021-06-30 DIAGNOSIS — E876 Hypokalemia: Secondary | ICD-10-CM | POA: Diagnosis not present

## 2021-07-01 DIAGNOSIS — E876 Hypokalemia: Secondary | ICD-10-CM | POA: Diagnosis not present

## 2021-07-01 DIAGNOSIS — M5136 Other intervertebral disc degeneration, lumbar region: Secondary | ICD-10-CM | POA: Diagnosis not present

## 2021-07-01 DIAGNOSIS — M47817 Spondylosis without myelopathy or radiculopathy, lumbosacral region: Secondary | ICD-10-CM | POA: Diagnosis not present

## 2021-07-01 DIAGNOSIS — G8929 Other chronic pain: Secondary | ICD-10-CM | POA: Diagnosis not present

## 2021-07-01 DIAGNOSIS — M4804 Spinal stenosis, thoracic region: Secondary | ICD-10-CM | POA: Diagnosis not present

## 2021-07-01 DIAGNOSIS — M48061 Spinal stenosis, lumbar region without neurogenic claudication: Secondary | ICD-10-CM | POA: Diagnosis not present

## 2021-07-04 DIAGNOSIS — E876 Hypokalemia: Secondary | ICD-10-CM | POA: Diagnosis not present

## 2021-07-04 DIAGNOSIS — M5136 Other intervertebral disc degeneration, lumbar region: Secondary | ICD-10-CM | POA: Diagnosis not present

## 2021-07-04 DIAGNOSIS — G8929 Other chronic pain: Secondary | ICD-10-CM | POA: Diagnosis not present

## 2021-07-04 DIAGNOSIS — M4804 Spinal stenosis, thoracic region: Secondary | ICD-10-CM | POA: Diagnosis not present

## 2021-07-04 DIAGNOSIS — M47817 Spondylosis without myelopathy or radiculopathy, lumbosacral region: Secondary | ICD-10-CM | POA: Diagnosis not present

## 2021-07-04 DIAGNOSIS — M48061 Spinal stenosis, lumbar region without neurogenic claudication: Secondary | ICD-10-CM | POA: Diagnosis not present

## 2021-07-05 DIAGNOSIS — E876 Hypokalemia: Secondary | ICD-10-CM | POA: Diagnosis not present

## 2021-07-05 DIAGNOSIS — M5136 Other intervertebral disc degeneration, lumbar region: Secondary | ICD-10-CM | POA: Diagnosis not present

## 2021-07-05 DIAGNOSIS — G8929 Other chronic pain: Secondary | ICD-10-CM | POA: Diagnosis not present

## 2021-07-05 DIAGNOSIS — M47817 Spondylosis without myelopathy or radiculopathy, lumbosacral region: Secondary | ICD-10-CM | POA: Diagnosis not present

## 2021-07-05 DIAGNOSIS — M48061 Spinal stenosis, lumbar region without neurogenic claudication: Secondary | ICD-10-CM | POA: Diagnosis not present

## 2021-07-05 DIAGNOSIS — M4804 Spinal stenosis, thoracic region: Secondary | ICD-10-CM | POA: Diagnosis not present

## 2021-07-06 DIAGNOSIS — M48061 Spinal stenosis, lumbar region without neurogenic claudication: Secondary | ICD-10-CM | POA: Diagnosis not present

## 2021-07-06 DIAGNOSIS — G8929 Other chronic pain: Secondary | ICD-10-CM | POA: Diagnosis not present

## 2021-07-06 DIAGNOSIS — M47817 Spondylosis without myelopathy or radiculopathy, lumbosacral region: Secondary | ICD-10-CM | POA: Diagnosis not present

## 2021-07-06 DIAGNOSIS — M5136 Other intervertebral disc degeneration, lumbar region: Secondary | ICD-10-CM | POA: Diagnosis not present

## 2021-07-06 DIAGNOSIS — M4804 Spinal stenosis, thoracic region: Secondary | ICD-10-CM | POA: Diagnosis not present

## 2021-07-06 DIAGNOSIS — E876 Hypokalemia: Secondary | ICD-10-CM | POA: Diagnosis not present

## 2021-07-08 DIAGNOSIS — M4804 Spinal stenosis, thoracic region: Secondary | ICD-10-CM | POA: Diagnosis not present

## 2021-07-08 DIAGNOSIS — E876 Hypokalemia: Secondary | ICD-10-CM | POA: Diagnosis not present

## 2021-07-08 DIAGNOSIS — M48061 Spinal stenosis, lumbar region without neurogenic claudication: Secondary | ICD-10-CM | POA: Diagnosis not present

## 2021-07-08 DIAGNOSIS — M5136 Other intervertebral disc degeneration, lumbar region: Secondary | ICD-10-CM | POA: Diagnosis not present

## 2021-07-08 DIAGNOSIS — M47817 Spondylosis without myelopathy or radiculopathy, lumbosacral region: Secondary | ICD-10-CM | POA: Diagnosis not present

## 2021-07-08 DIAGNOSIS — G8929 Other chronic pain: Secondary | ICD-10-CM | POA: Diagnosis not present

## 2021-07-11 DIAGNOSIS — M5136 Other intervertebral disc degeneration, lumbar region: Secondary | ICD-10-CM | POA: Diagnosis not present

## 2021-07-11 DIAGNOSIS — G8929 Other chronic pain: Secondary | ICD-10-CM | POA: Diagnosis not present

## 2021-07-11 DIAGNOSIS — M48061 Spinal stenosis, lumbar region without neurogenic claudication: Secondary | ICD-10-CM | POA: Diagnosis not present

## 2021-07-11 DIAGNOSIS — M47817 Spondylosis without myelopathy or radiculopathy, lumbosacral region: Secondary | ICD-10-CM | POA: Diagnosis not present

## 2021-07-11 DIAGNOSIS — E876 Hypokalemia: Secondary | ICD-10-CM | POA: Diagnosis not present

## 2021-07-11 DIAGNOSIS — M4804 Spinal stenosis, thoracic region: Secondary | ICD-10-CM | POA: Diagnosis not present

## 2021-07-12 DIAGNOSIS — M4804 Spinal stenosis, thoracic region: Secondary | ICD-10-CM | POA: Diagnosis not present

## 2021-07-12 DIAGNOSIS — G8929 Other chronic pain: Secondary | ICD-10-CM | POA: Diagnosis not present

## 2021-07-12 DIAGNOSIS — E876 Hypokalemia: Secondary | ICD-10-CM | POA: Diagnosis not present

## 2021-07-12 DIAGNOSIS — M47817 Spondylosis without myelopathy or radiculopathy, lumbosacral region: Secondary | ICD-10-CM | POA: Diagnosis not present

## 2021-07-12 DIAGNOSIS — M5136 Other intervertebral disc degeneration, lumbar region: Secondary | ICD-10-CM | POA: Diagnosis not present

## 2021-07-12 DIAGNOSIS — M48061 Spinal stenosis, lumbar region without neurogenic claudication: Secondary | ICD-10-CM | POA: Diagnosis not present

## 2021-07-13 DIAGNOSIS — M47817 Spondylosis without myelopathy or radiculopathy, lumbosacral region: Secondary | ICD-10-CM | POA: Diagnosis not present

## 2021-07-13 DIAGNOSIS — M48061 Spinal stenosis, lumbar region without neurogenic claudication: Secondary | ICD-10-CM | POA: Diagnosis not present

## 2021-07-13 DIAGNOSIS — G8929 Other chronic pain: Secondary | ICD-10-CM | POA: Diagnosis not present

## 2021-07-13 DIAGNOSIS — M5136 Other intervertebral disc degeneration, lumbar region: Secondary | ICD-10-CM | POA: Diagnosis not present

## 2021-07-13 DIAGNOSIS — M4804 Spinal stenosis, thoracic region: Secondary | ICD-10-CM | POA: Diagnosis not present

## 2021-07-13 DIAGNOSIS — E876 Hypokalemia: Secondary | ICD-10-CM | POA: Diagnosis not present

## 2021-07-14 DIAGNOSIS — G8929 Other chronic pain: Secondary | ICD-10-CM | POA: Diagnosis not present

## 2021-07-14 DIAGNOSIS — E876 Hypokalemia: Secondary | ICD-10-CM | POA: Diagnosis not present

## 2021-07-14 DIAGNOSIS — M4804 Spinal stenosis, thoracic region: Secondary | ICD-10-CM | POA: Diagnosis not present

## 2021-07-14 DIAGNOSIS — M48061 Spinal stenosis, lumbar region without neurogenic claudication: Secondary | ICD-10-CM | POA: Diagnosis not present

## 2021-07-14 DIAGNOSIS — M47817 Spondylosis without myelopathy or radiculopathy, lumbosacral region: Secondary | ICD-10-CM | POA: Diagnosis not present

## 2021-07-14 DIAGNOSIS — M5136 Other intervertebral disc degeneration, lumbar region: Secondary | ICD-10-CM | POA: Diagnosis not present

## 2021-07-15 DIAGNOSIS — M4804 Spinal stenosis, thoracic region: Secondary | ICD-10-CM | POA: Diagnosis not present

## 2021-07-15 DIAGNOSIS — M5136 Other intervertebral disc degeneration, lumbar region: Secondary | ICD-10-CM | POA: Diagnosis not present

## 2021-07-15 DIAGNOSIS — E876 Hypokalemia: Secondary | ICD-10-CM | POA: Diagnosis not present

## 2021-07-15 DIAGNOSIS — M47817 Spondylosis without myelopathy or radiculopathy, lumbosacral region: Secondary | ICD-10-CM | POA: Diagnosis not present

## 2021-07-15 DIAGNOSIS — G8929 Other chronic pain: Secondary | ICD-10-CM | POA: Diagnosis not present

## 2021-07-15 DIAGNOSIS — M48061 Spinal stenosis, lumbar region without neurogenic claudication: Secondary | ICD-10-CM | POA: Diagnosis not present

## 2021-07-19 DIAGNOSIS — M4804 Spinal stenosis, thoracic region: Secondary | ICD-10-CM | POA: Diagnosis not present

## 2021-07-19 DIAGNOSIS — G8929 Other chronic pain: Secondary | ICD-10-CM | POA: Diagnosis not present

## 2021-07-19 DIAGNOSIS — M5136 Other intervertebral disc degeneration, lumbar region: Secondary | ICD-10-CM | POA: Diagnosis not present

## 2021-07-19 DIAGNOSIS — M47817 Spondylosis without myelopathy or radiculopathy, lumbosacral region: Secondary | ICD-10-CM | POA: Diagnosis not present

## 2021-07-19 DIAGNOSIS — M48061 Spinal stenosis, lumbar region without neurogenic claudication: Secondary | ICD-10-CM | POA: Diagnosis not present

## 2021-07-19 DIAGNOSIS — E876 Hypokalemia: Secondary | ICD-10-CM | POA: Diagnosis not present

## 2021-07-20 ENCOUNTER — Observation Stay (HOSPITAL_COMMUNITY): Payer: Medicare Other

## 2021-07-20 ENCOUNTER — Emergency Department (HOSPITAL_COMMUNITY): Payer: Medicare Other

## 2021-07-20 ENCOUNTER — Other Ambulatory Visit: Payer: Self-pay

## 2021-07-20 ENCOUNTER — Observation Stay (HOSPITAL_BASED_OUTPATIENT_CLINIC_OR_DEPARTMENT_OTHER): Payer: Medicare Other

## 2021-07-20 ENCOUNTER — Encounter (HOSPITAL_COMMUNITY): Payer: Self-pay | Admitting: Emergency Medicine

## 2021-07-20 ENCOUNTER — Observation Stay (HOSPITAL_COMMUNITY)
Admission: EM | Admit: 2021-07-20 | Discharge: 2021-07-21 | Disposition: A | Discharge status: Home / Self Care | Payer: Medicare Other | Attending: Family Medicine | Admitting: Family Medicine

## 2021-07-20 DIAGNOSIS — Z79899 Other long term (current) drug therapy: Secondary | ICD-10-CM | POA: Insufficient documentation

## 2021-07-20 DIAGNOSIS — I509 Heart failure, unspecified: Secondary | ICD-10-CM | POA: Insufficient documentation

## 2021-07-20 DIAGNOSIS — M6281 Muscle weakness (generalized): Secondary | ICD-10-CM | POA: Insufficient documentation

## 2021-07-20 DIAGNOSIS — E876 Hypokalemia: Secondary | ICD-10-CM | POA: Diagnosis not present

## 2021-07-20 DIAGNOSIS — I110 Hypertensive heart disease with heart failure: Secondary | ICD-10-CM | POA: Insufficient documentation

## 2021-07-20 DIAGNOSIS — K219 Gastro-esophageal reflux disease without esophagitis: Secondary | ICD-10-CM | POA: Diagnosis present

## 2021-07-20 DIAGNOSIS — E782 Mixed hyperlipidemia: Secondary | ICD-10-CM | POA: Diagnosis not present

## 2021-07-20 DIAGNOSIS — R4701 Aphasia: Secondary | ICD-10-CM | POA: Diagnosis not present

## 2021-07-20 DIAGNOSIS — I517 Cardiomegaly: Secondary | ICD-10-CM | POA: Diagnosis not present

## 2021-07-20 DIAGNOSIS — G894 Chronic pain syndrome: Secondary | ICD-10-CM | POA: Diagnosis not present

## 2021-07-20 DIAGNOSIS — I10 Essential (primary) hypertension: Secondary | ICD-10-CM | POA: Diagnosis present

## 2021-07-20 DIAGNOSIS — R4781 Slurred speech: Secondary | ICD-10-CM | POA: Diagnosis not present

## 2021-07-20 DIAGNOSIS — R718 Other abnormality of red blood cells: Secondary | ICD-10-CM

## 2021-07-20 DIAGNOSIS — G9341 Metabolic encephalopathy: Principal | ICD-10-CM | POA: Insufficient documentation

## 2021-07-20 DIAGNOSIS — R531 Weakness: Secondary | ICD-10-CM | POA: Diagnosis not present

## 2021-07-20 DIAGNOSIS — Z20822 Contact with and (suspected) exposure to covid-19: Secondary | ICD-10-CM | POA: Diagnosis not present

## 2021-07-20 DIAGNOSIS — G319 Degenerative disease of nervous system, unspecified: Secondary | ICD-10-CM | POA: Diagnosis not present

## 2021-07-20 DIAGNOSIS — I639 Cerebral infarction, unspecified: Secondary | ICD-10-CM | POA: Diagnosis not present

## 2021-07-20 LAB — DIFFERENTIAL
Abs Immature Granulocytes: 0.02 10*3/uL (ref 0.00–0.07)
Basophils Absolute: 0 10*3/uL (ref 0.0–0.1)
Basophils Relative: 0 %
Eosinophils Absolute: 0.2 10*3/uL (ref 0.0–0.5)
Eosinophils Relative: 2 %
Immature Granulocytes: 0 %
Lymphocytes Relative: 45 %
Lymphs Abs: 3 10*3/uL (ref 0.7–4.0)
Monocytes Absolute: 0.7 10*3/uL (ref 0.1–1.0)
Monocytes Relative: 10 %
Neutro Abs: 3 10*3/uL (ref 1.7–7.7)
Neutrophils Relative %: 43 %

## 2021-07-20 LAB — PHOSPHORUS: Phosphorus: 3.6 mg/dL (ref 2.5–4.6)

## 2021-07-20 LAB — COMPREHENSIVE METABOLIC PANEL
ALT: 23 U/L (ref 0–44)
AST: 20 U/L (ref 15–41)
Albumin: 3.8 g/dL (ref 3.5–5.0)
Alkaline Phosphatase: 68 U/L (ref 38–126)
Anion gap: 10 (ref 5–15)
BUN: 15 mg/dL (ref 8–23)
CO2: 24 mmol/L (ref 22–32)
Calcium: 9 mg/dL (ref 8.9–10.3)
Chloride: 103 mmol/L (ref 98–111)
Creatinine, Ser: 0.96 mg/dL (ref 0.44–1.00)
GFR, Estimated: >60 mL/min (ref >60)
Glucose, Bld: 84 mg/dL (ref 70–99)
Potassium: 3.4 mmol/L — ABNORMAL LOW (ref 3.5–5.1)
Sodium: 137 mmol/L (ref 135–145)
Total Bilirubin: 0.6 mg/dL (ref 0.3–1.2)
Total Protein: 6.3 g/dL — ABNORMAL LOW (ref 6.5–8.1)

## 2021-07-20 LAB — RAPID URINE DRUG SCREEN, HOSP PERFORMED
Amphetamines: NOT DETECTED
Barbiturates: NOT DETECTED
Benzodiazepines: NOT DETECTED
Cocaine: NOT DETECTED
Opiates: NOT DETECTED
Tetrahydrocannabinol: NOT DETECTED

## 2021-07-20 LAB — URINALYSIS, ROUTINE W REFLEX MICROSCOPIC
Bilirubin Urine: NEGATIVE
Glucose, UA: NEGATIVE mg/dL
Hgb urine dipstick: NEGATIVE
Ketones, ur: NEGATIVE mg/dL
Leukocytes,Ua: NEGATIVE
Nitrite: NEGATIVE
Protein, ur: NEGATIVE mg/dL
Specific Gravity, Urine: 1.021 (ref 1.005–1.030)
pH: 5 (ref 5.0–8.0)

## 2021-07-20 LAB — CBC
HCT: 43.1 % (ref 36.0–46.0)
Hemoglobin: 14.1 g/dL (ref 12.0–15.0)
MCH: 33.7 pg (ref 26.0–34.0)
MCHC: 32.7 g/dL (ref 30.0–36.0)
MCV: 102.9 fL — ABNORMAL HIGH (ref 80.0–100.0)
Platelets: 208 10*3/uL (ref 150–400)
RBC: 4.19 MIL/uL (ref 3.87–5.11)
RDW: 14.5 % (ref 11.5–15.5)
WBC: 6.9 10*3/uL (ref 4.0–10.5)
nRBC: 0 % (ref 0.0–0.2)

## 2021-07-20 LAB — HEMOGLOBIN A1C
Hgb A1c MFr Bld: 5.5 % (ref 4.8–5.6)
Mean Plasma Glucose: 111.15 mg/dL

## 2021-07-20 LAB — ETHANOL: Alcohol, Ethyl (B): <10 mg/dL (ref <10)

## 2021-07-20 LAB — LIPID PANEL
Cholesterol: 195 mg/dL (ref 0–200)
HDL: 106 mg/dL (ref >40)
LDL Cholesterol: 62 mg/dL (ref 0–99)
Total CHOL/HDL Ratio: 1.8 RATIO
Triglycerides: 136 mg/dL (ref <150)
VLDL: 27 mg/dL (ref 0–40)

## 2021-07-20 LAB — ECHOCARDIOGRAM LIMITED
Height: 61 in
S' Lateral: 2.6 cm
Weight: 4201.09 oz

## 2021-07-20 LAB — FOLATE: Folate: 22.3 ng/mL (ref >5.9)

## 2021-07-20 LAB — VITAMIN B12: Vitamin B-12: 513 pg/mL (ref 180–914)

## 2021-07-20 LAB — AMMONIA: Ammonia: 13 umol/L (ref 9–35)

## 2021-07-20 LAB — RESP PANEL BY RT-PCR (FLU A&B, COVID) ARPGX2
Influenza A by PCR: NEGATIVE
Influenza B by PCR: NEGATIVE
SARS Coronavirus 2 by RT PCR: NEGATIVE

## 2021-07-20 LAB — GLUCOSE, CAPILLARY: Glucose-Capillary: 84 mg/dL (ref 70–99)

## 2021-07-20 LAB — PROTIME-INR
INR: 0.9 (ref 0.8–1.2)
Prothrombin Time: 12 seconds (ref 11.4–15.2)

## 2021-07-20 LAB — MAGNESIUM: Magnesium: 1.8 mg/dL (ref 1.7–2.4)

## 2021-07-20 LAB — APTT: aPTT: 22 seconds — ABNORMAL LOW (ref 24–36)

## 2021-07-20 MED ORDER — HEPARIN SODIUM (PORCINE) 5000 UNIT/ML IJ SOLN
5000.0000 [IU] | Freq: Three times a day (TID) | INTRAMUSCULAR | Status: DC
  Administered 2021-07-20 – 2021-07-21 (×3): 5000 [IU] via SUBCUTANEOUS
  Filled 2021-07-20 (×3): qty 1

## 2021-07-20 MED ORDER — HYDRALAZINE HCL 20 MG/ML IJ SOLN
5.0000 mg | Freq: Four times a day (QID) | INTRAMUSCULAR | Status: DC | PRN
  Administered 2021-07-20: 5 mg via INTRAVENOUS
  Filled 2021-07-20: qty 1

## 2021-07-20 MED ORDER — DICLOFENAC SODIUM 75 MG PO TBEC
75.0000 mg | DELAYED_RELEASE_TABLET | Freq: Every day | ORAL | Status: DC
  Administered 2021-07-20 – 2021-07-21 (×2): 75 mg via ORAL
  Filled 2021-07-20 (×2): qty 1

## 2021-07-20 MED ORDER — POTASSIUM CHLORIDE 10 MEQ/100ML IV SOLN
INTRAVENOUS | Status: AC
  Filled 2021-07-20: qty 100

## 2021-07-20 MED ORDER — CALCITRIOL 0.25 MCG PO CAPS
0.2500 ug | ORAL_CAPSULE | ORAL | Status: DC
  Administered 2021-07-20: 0.25 ug via ORAL
  Filled 2021-07-20 (×2): qty 1

## 2021-07-20 MED ORDER — CYCLOBENZAPRINE HCL 10 MG PO TABS
5.0000 mg | ORAL_TABLET | Freq: Two times a day (BID) | ORAL | Status: DC | PRN
  Administered 2021-07-20: 5 mg via ORAL
  Filled 2021-07-20: qty 1

## 2021-07-20 MED ORDER — ACETAMINOPHEN 325 MG PO TABS: 650.0000 mg | ORAL_TABLET | Freq: Four times a day (QID) | ORAL | Status: DC | PRN

## 2021-07-20 MED ORDER — PANTOPRAZOLE SODIUM 40 MG IV SOLR
40.0000 mg | Freq: Every day | INTRAVENOUS | Status: DC
  Administered 2021-07-20 – 2021-07-21 (×2): 40 mg via INTRAVENOUS
  Filled 2021-07-20 (×2): qty 40

## 2021-07-20 MED ORDER — LIDOCAINE 5 % EX PTCH: 1.0000 | MEDICATED_PATCH | Freq: Two times a day (BID) | CUTANEOUS | Status: DC | PRN

## 2021-07-20 MED ORDER — GABAPENTIN 400 MG PO CAPS
400.0000 mg | ORAL_CAPSULE | Freq: Three times a day (TID) | ORAL | Status: DC
  Administered 2021-07-20 – 2021-07-21 (×4): 400 mg via ORAL
  Filled 2021-07-20 (×4): qty 1

## 2021-07-20 MED ORDER — LEFLUNOMIDE 10 MG PO TABS
10.0000 mg | ORAL_TABLET | Freq: Every day | ORAL | Status: DC
  Administered 2021-07-20 – 2021-07-21 (×2): 10 mg via ORAL
  Filled 2021-07-20 (×3): qty 1

## 2021-07-20 MED ORDER — POTASSIUM CHLORIDE 10 MEQ/100ML IV SOLN
10.0000 meq | INTRAVENOUS | Status: AC
Start: 2021-07-20 — End: 2021-07-20
  Administered 2021-07-20: 10 meq via INTRAVENOUS
  Filled 2021-07-20: qty 100

## 2021-07-20 MED ORDER — ATORVASTATIN CALCIUM 20 MG PO TABS
20.0000 mg | ORAL_TABLET | Freq: Every day | ORAL | Status: DC
  Administered 2021-07-20 – 2021-07-21 (×2): 20 mg via ORAL
  Filled 2021-07-20 (×2): qty 1

## 2021-07-20 MED ORDER — METOPROLOL TARTRATE 5 MG/5ML IV SOLN
5.0000 mg | Freq: Three times a day (TID) | INTRAVENOUS | Status: DC
  Administered 2021-07-20 – 2021-07-21 (×3): 5 mg via INTRAVENOUS
  Filled 2021-07-20 (×3): qty 5

## 2021-07-20 MED ORDER — LORAZEPAM 2 MG/ML IJ SOLN
2.0000 mg | Freq: Once | INTRAMUSCULAR | Status: AC
  Administered 2021-07-20: 2 mg via INTRAVENOUS
  Filled 2021-07-20: qty 1

## 2021-07-20 MED ORDER — PREDNISONE 5 MG PO TABS
9.0000 mg | ORAL_TABLET | Freq: Every day | ORAL | Status: DC
  Administered 2021-07-21: 9 mg via ORAL
  Filled 2021-07-20 (×2): qty 4

## 2021-07-20 MED ORDER — DICLOFENAC SODIUM 1 % EX GEL
2.0000 g | Freq: Four times a day (QID) | CUTANEOUS | Status: DC | PRN
  Filled 2021-07-20: qty 100

## 2021-07-20 MED ORDER — POTASSIUM CHLORIDE 10 MEQ/100ML IV SOLN
10.0000 meq | INTRAVENOUS | Status: AC
  Administered 2021-07-20 (×5): 10 meq via INTRAVENOUS
  Filled 2021-07-20 (×3): qty 100

## 2021-07-20 MED ORDER — TRAMADOL HCL 50 MG PO TABS
50.0000 mg | ORAL_TABLET | Freq: Four times a day (QID) | ORAL | Status: DC | PRN
  Administered 2021-07-20 – 2021-07-21 (×5): 50 mg via ORAL
  Filled 2021-07-20 (×5): qty 1

## 2021-07-20 NOTE — H&P (Addendum)
History and Physical  Nicole [name] IWL:[id number] DOB: 04-Nov-1945 DOA: 07/20/2021  Referring physician: Merrily Pew, MD PCP: Jake Samples, PA-C  Patient coming from: Home  Chief Complaint: Altered mental status  HPI: Nicole [name] is a 75 y.o. female with medical history significant for chronic pain syndrome, hypertension, hyperlipidemia, GERD who presents to the emergency department due to altered mental status.  Patient was last well-known at 8 PM on Monday (9/26).  Patient was seen by son around 2 PM yesterday (9/27) and she was more confused, lethargic and slow to respond, BP was checked and was noted to be elevated.  It was uncertain if this was related to patient incorrectly taking meds.  At baseline, patient is wheelchair-bound.  There was no report of fever, chills, nausea, vomiting, chest pain or shortness of breath.   ED Course:  In the emergency department, BP was 154/119, other vital signs are within normal range.  Work-up in the ED showed normal CBC except for elevated MCV at 102.9 and BMP except for mild hypokalemia, urinalysis was unimpressive for UTI, urine drug screen was negative, ammonia was 13.  Influenza A, B, SARS coronavirus 2 was negative. Chest x-ray showed no evidence of acute cardiopulmonary disease  CT of head without contrast showed normal head CT Tele-neurology was consulted and suspected metabolic encephalopathy, but also indicated further stroke work-up.  Hospitalist was asked to admit patient for further evaluation and management  Review of Systems: Constitutional: Negative for chills and fever.  HENT: Negative for ear pain and sore throat.   Eyes: Negative for pain and visual disturbance.  Respiratory: Negative for cough, chest tightness and shortness of breath.   Cardiovascular: Negative for chest pain and palpitations.  Gastrointestinal: Negative for abdominal pain and vomiting.  Endocrine: Negative for polyphagia and polyuria.  Genitourinary:  Negative for decreased urine volume, dysuria, enuresis Musculoskeletal: Negative for arthralgias and back pain.  Skin: Negative for color change and rash.  Allergic/Immunologic: Negative for immunocompromised state.  Neurological: Positive for speech difficulty.  Negative for tremors, syncope.   Hematological: Does not bruise/bleed easily.  All other systems reviewed and are negative  Past Medical History:  Diagnosis Date   AC (acromioclavicular) joint bone spurs    lt shoulder   Anemia    Arthritis    Carpal tunnel syndrome, bilateral    CHF (congestive heart failure) (HCC)    Collagen vascular disease (HCC)    Gastroesophageal reflux    Headache    recent visit to ER @ [facility] for severe headache   Hypertension    Lumbar stenosis    Hx of ESIs by Dr. Nelva Bush   Polyarthralgia    Polymyalgia [facility])    Shingles    Spinal stenosis    Past Surgical History:  Procedure Laterality Date   BACK SURGERY  07/05/2018, 07/2018   x2    BIOPSY  09/22/2020   Procedure: BIOPSY;  Surgeon: Rogene Houston, MD;  [facility];  Service: Endoscopy;;   CHOLECYSTECTOMY     COLONOSCOPY  06/11/2012   Procedure: COLONOSCOPY;  Surgeon: Jamesetta So, MD;  Location: [facility];  Service: Gastroenterology;  Laterality: N/A;   COLONOSCOPY WITH PROPOFOL N/A 09/22/2020   Procedure: COLONOSCOPY WITH PROPOFOL;  Surgeon: Rogene Houston, MD;  Location: [facility];  Service: Endoscopy;  Laterality: N/A;  730   HYSTEROSCOPY WITH D & C N/A 02/25/2014   Procedure: DILATATION AND CURETTAGE /HYSTEROSCOPY;  Surgeon: Florian Buff, MD;  Location:  [facility];  Service: Gynecology;  Laterality: N/A;   POLYPECTOMY N/A 02/25/2014   Procedure: POLYPECTOMY;  Surgeon: Florian Buff, MD;  Location: [facility];  Service: Gynecology;  Laterality: N/A;   RESECTION DISTAL CLAVICAL Right 03/26/2015   Procedure: OPEN DISTAL CLAVICAL RESECTION ;  Surgeon: Netta Cedars, MD;  Location: [place];  Service: Orthopedics;  Laterality:  Right;    Social History:  reports that she has never smoked. She has never used smokeless tobacco. She reports that she does not drink alcohol and does not use drugs.   Allergies  Allergen Reactions   Oxycontin [Oxycodone Hcl] Swelling    Pt tolerates hydromorphone.   Sulfa Antibiotics Other (See Comments)    Sores   Tramadol     "Makes me feel like I am falling"   Penicillins Rash   Sulfasalazine Other (See Comments)    Sores    Family History  Problem Relation Age of Onset   Hypertension Mother    Pneumonia Father    Arthritis Sister    Diabetes Paternal Grandmother      Prior to Admission medications   Medication Sig Start Date End Date Taking? Authorizing Provider  acetaminophen (TYLENOL) 325 MG tablet Take 2 tablets (650 mg total) by mouth every 6 (six) hours as needed for mild pain or headache (or Fever >/= 101). 05/06/18   Barton Dubois, MD  atorvastatin (LIPITOR) 20 MG tablet Take 20 mg by mouth daily. 01/13/21   [provider]  calcitRIOL (ROCALTROL) 0.25 MCG capsule Take 0.25 mcg by mouth See admin instructions. Take 0.25 every Mon, Wed, Fri. 09/06/20   [provider]  cyclobenzaprine (FLEXERIL) 5 MG tablet Take 5 mg by mouth every 12 (twelve) hours as needed for muscle spasms.  02/20/20   [provider]  diclofenac (VOLTAREN) 75 MG EC tablet Take 75 mg by mouth daily. 07/09/19   [provider]  diclofenac Sodium (VOLTAREN) 1 % GEL Apply 2 g topically 4 (four) times daily as needed (pain).     [provider]  gabapentin (NEURONTIN) 400 MG capsule Take 400 mg by mouth 3 (three) times daily.  06/17/19   [provider]  leflunomide (ARAVA) 10 MG tablet TAKE (1) TABLET BY MOUTH DAILY. Patient taking differently: Take 10 mg by mouth daily. 12/27/20   Ofilia Neas, PA-C  lidocaine (LIDODERM) 5 % Place 1-2 patches onto the skin every 12 (twelve) hours as needed (pain).  02/19/20   [provider]  metolazone  (ZAROXOLYN) 5 MG tablet Take 5 mg by mouth 2 (two) times a week.    [provider]  Multiple Vitamins-Minerals (MULTIVITAMIN PO) Take 1 tablet by mouth daily.     [provider]  pantoprazole (PROTONIX) 40 MG tablet Take 40 mg by mouth 2 (two) times daily.  08/20/13   Milton Ferguson, MD  potassium chloride (KLOR-CON) 10 MEQ tablet Take 10 mEq by mouth daily. 09/06/20   [provider]  predniSONE (DELTASONE) 1 MG tablet 4 tabs QD x 1 month (total 9 mg daily), then 3 tabs QD x 1 month (total 8 mg daily), then 2 tabs QD x 1 month (total 7 mg daily), then 1 tab QD x 1 month (total 6mg  daily). Take in combination with one 5mg  tablet. 05/12/21   Bo Merino, MD  Sarilumab [facility]) 200 MG/1.14ML SOAJ Inject 200 mg into the skin every 14 (fourteen) days. 05/09/21   Ofilia Neas, PA-C  Simethicone (GAS-X PO) Take 1  tablet by mouth 3 (three) times daily as needed (flatulence).     [provider]  tapentadol (NUCYNTA) 50 MG tablet Take 50 mg by mouth daily as needed for moderate pain.    [provider]  Tapentadol HCl (NUCYNTA ER) 200 MG TB12 Take 200 mg by mouth every 12 (twelve) hours.    [provider]  torsemide (DEMADEX) 100 MG tablet Take 100 mg by mouth 2 (two) times daily.    [provider]    Physical Exam: BP (!) 152/102   Pulse 95   Temp 98.1 F (36.7 C) (Oral)   Resp (!) 22   SpO2 95%   General: 75 y.o. year-old female well developed well nourished in no acute distress.   HEENT: NCAT, EOMI Neck: Supple, trachea medial Cardiovascular: Regular rate and rhythm with no rubs or gallops.  No thyromegaly or JVD noted.  No lower extremity edema. 2/4 pulses in all 4 extremities. Respiratory: Clear to auscultation with no wheezes or rales. Good inspiratory effort. Abdomen: Soft, nontender nondistended with normal bowel sounds x4 quadrants. Muskuloskeletal: No cyanosis, clubbing or edema noted bilaterally Neuro: CN II-XII  intact, strength 5/5 x 4, sensation, reflexes intact.NIHSS 7 (left leg motor drift-some effort against gravity(+2).  Right leg motor drift-some effort against gravity(+2), language/aphasia-severe aphasia (+2), dysarthria-mild to moderate dysarthria-slurred but can be understood (+1). Skin: No ulcerative lesions noted or rashes Psychiatry: Mood is appropriate for condition and setting          Labs on Admission:  Basic Metabolic Panel: Recent Labs  Lab 07/20/21 0056  NA 137  K 3.4*  CL 103  CO2 24  GLUCOSE 84  BUN 15  CREATININE 0.96  CALCIUM 9.0   Liver Function Tests: Recent Labs  Lab 07/20/21 0056  AST 20  ALT 23  ALKPHOS 68  BILITOT 0.6  PROT 6.3*  ALBUMIN 3.8   No results for input(s): LIPASE, AMYLASE in the last 168 hours. Recent Labs  Lab 07/20/21 0101  AMMONIA 13   CBC: Recent Labs  Lab 07/20/21 0056  WBC 6.9  NEUTROABS 3.0  HGB 14.1  HCT 43.1  MCV 102.9*  PLT 208   Cardiac Enzymes: No results for input(s): CKTOTAL, CKMB, CKMBINDEX, TROPONINI in the last 168 hours.  BNP (last 3 results) No results for input(s): BNP in the last 8760 hours.  ProBNP (last 3 results) No results for input(s): PROBNP in the last 8760 hours.  CBG: No results for input(s): GLUCAP in the last 168 hours.  Radiological Exams on Admission: DG Chest Portable 1 View  Result Date: 07/20/2021 CLINICAL DATA:  Weakness, slurred speech EXAM: PORTABLE CHEST 1 VIEW COMPARISON:  03/31/2021 FINDINGS: Lingular scarring/atelectasis. Right lung is clear. No pleural effusion or pneumothorax. Mild cardiomegaly. Degenerative changes of the bilateral shoulders. IMPRESSION: No evidence of acute cardiopulmonary disease. Mild cardiomegaly. Electronically Signed   By: Julian Hy M.D.   On: 07/20/2021 02:01   CT HEAD CODE STROKE WO CONTRAST  Result Date: 07/20/2021 CLINICAL DATA:  Code stroke.  Speech changes EXAM: CT HEAD WITHOUT CONTRAST TECHNIQUE: Contiguous axial images were obtained  from the base of the skull through the vertex without intravenous contrast. COMPARISON:  None. FINDINGS: Brain: There is no mass, hemorrhage or extra-axial collection. The size and configuration of the ventricles and extra-axial CSF spaces are normal. The brain parenchyma is normal, without evidence of acute or chronic infarction. Vascular: No abnormal hyperdensity of the major intracranial arteries or dural venous sinuses. No intracranial  atherosclerosis. Skull: The visualized skull base, calvarium and extracranial soft tissues are normal. Sinuses/Orbits: No fluid levels or advanced mucosal thickening of the visualized paranasal sinuses. No mastoid or middle ear effusion. The orbits are normal. ASPECTS [facility] Stroke Program Early CT Score) - Ganglionic level infarction (caudate, lentiform nuclei, internal capsule, insula, M1-M3 cortex): 7 - Supraganglionic infarction (M4-M6 cortex): 3 Total score (0-10 with 10 being normal): 10 IMPRESSION: 1. Normal head CT. 2. ASPECTS is 10. These results were called by telephone at the time of interpretation on 07/20/2021 at 1:06 am to provider [facility] , who verbally acknowledged these results. Electronically Signed   By: Ulyses Jarred M.D.   On: 07/20/2021 01:06    EKG: I independently viewed the EKG done and my findings are as followed: No EKG was done in the ED  Assessment/Plan Present on Admission:  Acute metabolic encephalopathy  Chronic pain syndrome  HTN (hypertension)  GERD (gastroesophageal reflux disease)  Principal Problem:   Acute metabolic encephalopathy Active Problems:   GERD (gastroesophageal reflux disease)   HTN (hypertension)   Chronic pain syndrome   Hypokalemia   Elevated MCV   Mixed hyperlipidemia  Acute metabolic encephalopathy possibly secondary to polypharmacy, rule out acute ischemic stroke CT of head without contrast showed normal head CT Patient has chronic pain syndrome and is on several pain and immunosuppressant  drugs-these will be held at this time Patient will be admitted to telemetry unit  Echocardiogram in the morning MRI of brain without contrast in the morning Consider starting aspirin 325mg  daily when patient is alert enough to tolerate oral intake per teleneurology recommendation Continue statin when patient is alert enough to be able to take oral meds Continue fall precautions and neuro checks Lipid panel and hemoglobin A1c will be checked Continue PT/SLP/OT eval and treat Bedside swallow eval by nursing prior to diet Neurologist will be consulted and we shall await further recommendation t  Hypokalemia K+ is 3.4 K+ will be replenished Please monitor for AM K+ for further replenishmemnt  Elevated MCV (102.9) Vitamin B84 and folic acid will be done  Essential hypertension Permissive hypertension will be allowed at this time except for BP > 220/120  Mixed hyperlipidemia Continue statin when patient is able to tolerate oral intake  GERD Continue Protonix  Chronic pain syndrome Home meds will be held at this time due to acute metabolic encephalopathy which may be due to polypharmacy Continue Tylenol as needed   DVT prophylaxis: SCDs  Code Status: Full code  Family Communication: None at bedside  Disposition Plan:  Patient is from:                        home Anticipated DC to:                   SNF or family members home Anticipated DC date:               2-3 days Anticipated DC barriers:          Patient requires inpatient admission due to possible acute ischemic stroke requiring further stroke work-up   Consults called: Neurology  Admission status: Observation    Bernadette Hoit MD [facility]  07/20/2021, 5:10 AM

## 2021-07-20 NOTE — Evaluation (Signed)
Occupational Therapy Evaluation Patient Details Name: Nicole [name] MRN: [id number] DOB: June 17, 1946 Today's Date: 07/20/2021   History of Present Illness Nicole [name] is a 75 y.o. female with medical history significant for chronic pain syndrome, hypertension, hyperlipidemia, GERD who presents to the emergency department due to altered mental status.  Patient was last well-known at 8 PM on Monday (9/26).  Patient was seen by son around 2 PM yesterday (9/27) and she was more confused, lethargic and slow to respond, BP was checked and was noted to be elevated.  It was uncertain if this was related to patient incorrectly taking meds.  At baseline, patient is wheelchair-bound.  There was no report of fever, chills, nausea, vomiting, chest pain or shortness of breath.   Clinical Impression   Pt agreeable to OT/PT co-evaluation. Pt presents with expressive difficulties this date. Receptive communication appears to be intact. Pt required min A for supine to sit and rolling bed mobility. Min to mod A needed for sit to supine. Pt did not attempt a transfer today due to crying and reports of pain in B LE. Pt required total assist to don socks at bed level this date. Pt able to bear weight through B UE but does demonstrate shoulder A/ROM limitation bilaterally. Pt will benefit from continued OT in the hospital and recommended venue below to increase strength, balance, and endurance for safe ADL's.       Recommendations for follow up therapy are one component of a multi-disciplinary discharge planning process, led by the attending physician.  Recommendations may be updated based on patient status, additional functional criteria and insurance authorization.   Follow Up Recommendations  SNF    Equipment Recommendations  None recommended by OT           Precautions / Restrictions Precautions Precautions: Fall Restrictions Weight Bearing Restrictions: No      Mobility Bed Mobility Overal bed mobility:  Needs Assistance Bed Mobility: Supine to Sit;Sit to Supine;Rolling Rolling: Min assist   Supine to sit: Min assist Sit to supine: Mod assist;Min assist   General bed mobility comments: slow labored movement; grimacing in pain    Transfers                      Balance Overall balance assessment: Needs assistance Sitting-balance support: Feet supported;No upper extremity supported Sitting balance-Leahy Scale: Fair (Pt seeking B UE support at EOB.) Sitting balance - Comments: Seated at EOB.                                   ADL either performed or assessed with clinical judgement   ADL Overall ADL's : Needs assistance/impaired                     Lower Body Dressing: Total assistance;Bed level Lower Body Dressing Details (indicate cue type and reason): total asist to don socks at bed level               General ADL Comments: Pt did not attempt transfer due to pain in B LE.     Vision Patient Visual Report: Other (comment) (Unable to obtain vision history due to expressive difficulties.) Vision Assessment?: Yes Tracking/Visual Pursuits: Other (comment) (Mild nystagmus noted when tracking in R upper quadrant.)                Pertinent Vitals/Pain Pain Assessment: Faces Faces Pain Scale: Hurts  whole lot Pain Location: Bilateral LE; pt also reported pain in middle back Pain Descriptors / Indicators: Moaning;Guarding;Grimacing Pain Intervention(s): Limited activity within patient's tolerance;Monitored during session;Repositioned     Hand Dominance     Extremity/Trunk Assessment Upper Extremity Assessment Upper Extremity Assessment: RUE deficits/detail;LUE deficits/detail RUE Deficits / Details: A/ROM of shoulder flexion WFL in supine with HOB elevated. WFL P/ROM shoulder flexion and abduction. Pt able to bear weight through B UE when seated at EOB. LUE Deficits / Details: ~90* shoulder flexion A/ROM supine with HOB elevated. P/ROm  limited to ~145* for shoulder flexion and abduction. Pt able to bear weight through B UE when seated at EOB.   Lower Extremity Assessment Lower Extremity Assessment: Defer to PT evaluation   Cervical / Trunk Assessment Cervical / Trunk Assessment: Normal   Communication Communication Communication: Expressive difficulties   Cognition Arousal/Alertness: Awake/alert Behavior During Therapy: WFL for tasks assessed/performed Overall Cognitive Status: No family/caregiver present to determine baseline cognitive functioning                                 General Comments: Pt appeared to be cognitively sound but was having trouble with expressive communication.   General Comments                  Home Living Family/patient expects to be discharged to:: Skilled nursing facility (Pt unable to report history at this time due to expressive difficulties. Likely d/c is to SNF) Living Arrangements: Alone                               Additional Comments: Unable to obtain history due to pt expressive communication deficits      Prior Functioning/Environment Level of Independence: Needs assistance  Gait / Transfers Assistance Needed: Pt shook head as to answer "no" when asked if she used a RW. ADL's / Homemaking Assistance Needed: Pt reportedly has a sitter at baseline. Likely requires assist.            OT Problem List: Decreased range of motion;Decreased strength;Decreased activity tolerance;Impaired balance (sitting and/or standing);Impaired vision/perception;Pain;Obesity      OT Treatment/Interventions: Self-care/ADL training;Therapeutic exercise;Neuromuscular education;DME and/or AE instruction;Therapeutic activities;Patient/family education;Visual/perceptual remediation/compensation;Balance training    OT Goals(Current goals can be found in the care plan section) Acute Rehab OT Goals Patient Stated Goal: no goal stated OT Goal Formulation: Patient  unable to participate in goal setting Time For Goal Achievement: 08/03/21 Potential to Achieve Goals: Fair  OT Frequency: Min 2X/week               Co-evaluation PT/OT/SLP Co-Evaluation/Treatment: Yes Reason for Co-Treatment: Complexity of the patient's impairments (multi-system involvement)   OT goals addressed during session: ADL's and self-care;Strengthening/ROM      AM-PAC OT "6 Clicks" Daily Activity     Outcome Measure Help from another person eating meals?: None Help from another person taking care of personal grooming?: A Lot Help from another person toileting, which includes using toliet, bedpan, or urinal?: A Lot Help from another person bathing (including washing, rinsing, drying)?: A Lot Help from another person to put on and taking off regular upper body clothing?: A Little Help from another person to put on and taking off regular lower body clothing?: Total 6 Click Score: 14   End of Session    Activity Tolerance: Patient limited by pain Patient left: in bed;with  call bell/phone within reach;with bed alarm set  OT Visit Diagnosis: Unsteadiness on feet (R26.81);Pain;Cognitive communication deficit (R41.841) Pain - Right/Left:  (bilateral) Pain - part of body: Leg                Time: 5859-2924 OT Time Calculation (min): 17 min Charges:  OT General Charges $OT Visit: 1 Visit OT Evaluation $OT Eval Moderate Complexity: 1 Mod  Keyshon Stein OT, MOT  Larey Seat 07/20/2021, 10:29 AM

## 2021-07-20 NOTE — ED Notes (Signed)
Neurologist spoke with son, determined that it was Monday around 8pm the last time pt was "her normal self"- pt son talked with her on phone Then Tuesday at 2 pm pt son saw pt and she was groggy and slow to respond, but has gotten progressively worse since.

## 2021-07-20 NOTE — Progress Notes (Addendum)
PROGRESS NOTE    Nicole [name]  FYB:[id number] DOB: 10-May-1946 DOA: 07/20/2021 PCP: Jake Samples, PA-C   Brief Narrative:  75 y.o. BF PMHx chronic pain syndrome, hypertension, hyperlipidemia, GERD   Presents to the emergency department due to altered mental status.  Patient was last well-known at 8 PM on Monday (9/26).  Patient was seen by son around 2 PM yesterday (9/27) and she was more confused, lethargic and slow to respond, BP was checked and was noted to be elevated.  It was uncertain if this was related to patient incorrectly taking meds.  At baseline, patient is wheelchair-bound.  There was no report of fever, chills, nausea, vomiting, chest pain or shortness of breath.    ED Course:  In the emergency department, BP was 154/119, other vital signs are within normal range.  Work-up in the ED showed normal CBC except for elevated MCV at 102.9 and BMP except for mild hypokalemia, urinalysis was unimpressive for UTI, urine drug screen was negative, ammonia was 13.  Influenza A, B, SARS coronavirus 2 was negative. Chest x-ray showed no evidence of acute cardiopulmonary disease  CT of head without contrast showed normal head CT Tele-neurology was consulted and suspected metabolic encephalopathy, but also indicated further stroke work-up.  Hospitalist was asked to admit patient for further evaluation and management   Subjective: Alert severe expressive aphasia, attempts to answer questions appropriately.  Patient admitted this A.m. no charge   Assessment & Plan:  Covid vaccination;  Principal Problem:   Acute metabolic encephalopathy Active Problems:   GERD (gastroesophageal reflux disease)   HTN (hypertension)   Chronic pain syndrome   Hypokalemia   Elevated MCV   Mixed hyperlipidemia   Acute metabolic encephalopathy possibly secondary to polypharmacy, rule out acute ischemic stroke CT of head without contrast showed normal head CT Patient has chronic pain syndrome and is  on several pain and immunosuppressant drugs-these will be held at this time Patient will be admitted to telemetry unit  Echocardiogram in the morning MRI of brain without contrast in the morning Consider starting aspirin 325mg  daily when patient is alert enough to tolerate oral intake per teleneurology recommendation Continue statin when patient is alert enough to be able to take oral meds Continue fall precautions and neuro checks Lipid panel and hemoglobin A1c will be checked Continue PT/SLP/OT eval and treat Bedside swallow eval by nursing prior to diet Neurologist will be consulted and we shall await further recommendation -9/28 EtOH negative -9/28 urine tox screen negative -9/28 MRI brain negative for acute stroke see results below  Aphasia - 9/28 swallow study pending   Hypokalemia - Potassium goal> 4 - 9/28 potassium IV 50 mEq  Elevated MCV (102.9) Vitamin N27 and folic acid will be done  Essential hypertension -MRI negative for acute stroke - 9/28 Metoprolol IV 5 mg TID -9/28 Hydralazine PRN  Mixed hyperlipidemia Continue statin when patient is able to tolerate oral intake  GERD Continue Protonix  Chronic pain syndrome/chronic RA -Restarted some of patient's home pain medications. - Judicious management given patient's continued aphasia and altered mental status  -9/28 air mattress -Prior to discharge patient needs to be referred to a pain clinic given her significant amount of pain medication, age, and altered mental status. -Discussed case with pharmacy patient on Prednisone 9 mg daily   DVT prophylaxis: Heparin subcu Code Status:  Family Communication:  Status is: Inpatient    Dispo: The patient is from:  Anticipated d/c is to:               Anticipated d/c date is:               Patient currently       Consultants:    Procedures/Significant Events:  9/28 MRI brain W0 contrast: No evidence of acute intracranial abnormality on  this motion limited exam. 2. Moderate chronic microvascular ischemic disease and mild atrophy.  I have personally reviewed and interpreted all radiology studies and my findings are as above.  VENTILATOR SETTINGS:    Cultures   Antimicrobials:    Devices    LINES / TUBES:      Continuous Infusions:  potassium chloride 10 mEq (07/20/21 0657)     Objective: Vitals:   07/20/21 0330 07/20/21 0417 07/20/21 0500 07/20/21 0604  BP: (!) 167/105 (!) 152/102 (!) 146/100 (!) 163/98  Pulse: 93 95 91 93  Resp: 17 (!) 22 18 18   Temp:   98.1 F (36.7 C) 98.3 F (36.8 C)  TempSrc:   Oral Oral  SpO2: 97% 95% 98% (P) 95%   No intake or output data in the 24 hours ending 07/20/21 0735 There were no vitals filed for this visit.  Examination:  Patient admitted this A.m. no charge Central nervous system:  Cranial nerves II through XII intact, tongue/uvula midline, all extremities muscle strength 5/5, sensation intact throughout, positive dysarthria, positive expressive aphasia, negative receptive aphasia.  .     Data Reviewed: Care during the described time interval was provided by me .  I have reviewed this patient's available data, including medical history, events of note, physical examination, and all test results as part of my evaluation.   CBC: Recent Labs  Lab 07/20/21 0056  WBC 6.9  NEUTROABS 3.0  HGB 14.1  HCT 43.1  MCV 102.9*  PLT 614   Basic Metabolic Panel: Recent Labs  Lab 07/20/21 0056  NA 137  K 3.4*  CL 103  CO2 24  GLUCOSE 84  BUN 15  CREATININE 0.96  CALCIUM 9.0  MG 1.8  PHOS 3.6   GFR: CrCl cannot be calculated (Unknown ideal weight.). Liver Function Tests: Recent Labs  Lab 07/20/21 0056  AST 20  ALT 23  ALKPHOS 68  BILITOT 0.6  PROT 6.3*  ALBUMIN 3.8   No results for input(s): LIPASE, AMYLASE in the last 168 hours. Recent Labs  Lab 07/20/21 0101  AMMONIA 13   Coagulation Profile: Recent Labs  Lab 07/20/21 0056  INR  0.9   Cardiac Enzymes: No results for input(s): CKTOTAL, CKMB, CKMBINDEX, TROPONINI in the last 168 hours. BNP (last 3 results) No results for input(s): PROBNP in the last 8760 hours. HbA1C: No results for input(s): HGBA1C in the last 72 hours. CBG: No results for input(s): GLUCAP in the last 168 hours. Lipid Profile: Recent Labs    07/20/21 0056  CHOL 195  HDL 106  LDLCALC 62  TRIG 136  CHOLHDL 1.8   Thyroid Function Tests: No results for input(s): TSH, T4TOTAL, FREET4, T3FREE, THYROIDAB in the last 72 hours. Anemia Panel: No results for input(s): VITAMINB12, FOLATE, FERRITIN, TIBC, IRON, RETICCTPCT in the last 72 hours. Urine analysis:    Component Value Date/Time   COLORURINE YELLOW 07/20/2021 0320   APPEARANCEUR CLEAR 07/20/2021 0320   LABSPEC 1.021 07/20/2021 0320   PHURINE 5.0 07/20/2021 0320   GLUCOSEU NEGATIVE 07/20/2021 0320   HGBUR NEGATIVE 07/20/2021 0320   BILIRUBINUR NEGATIVE 07/20/2021 0320  BILIRUBINUR negative 02/14/2020 1328   KETONESUR NEGATIVE 07/20/2021 0320   PROTEINUR NEGATIVE 07/20/2021 0320   UROBILINOGEN 0.2 02/14/2020 1328   UROBILINOGEN 0.2 02/20/2014 1116   NITRITE NEGATIVE 07/20/2021 0320   LEUKOCYTESUR NEGATIVE 07/20/2021 0320   Sepsis Labs: @LABRCNTIP (procalcitonin:4,lacticidven:4)  ) Recent Results (from the past 240 hour(s))  Resp Panel by RT-PCR (Flu A&B, Covid) Nasopharyngeal Swab     Status: None   Collection Time: 07/20/21 12:46 AM   Specimen: Nasopharyngeal Swab; Nasopharyngeal(NP) swabs in vial transport medium  Result Value Ref Range Status   SARS Coronavirus 2 by RT PCR NEGATIVE NEGATIVE Final    Comment: (NOTE) SARS-CoV-2 target nucleic acids are NOT DETECTED.  The SARS-CoV-2 RNA is generally detectable in upper respiratory specimens during the acute phase of infection. The lowest concentration of SARS-CoV-2 viral copies this assay can detect is 138 copies/mL. A negative result does not preclude SARS-Cov-2 infection  and should not be used as the sole basis for treatment or other patient management decisions. A negative result may occur with  improper specimen collection/handling, submission of specimen other than nasopharyngeal swab, presence of viral mutation(s) within the areas targeted by this assay, and inadequate number of viral copies(<138 copies/mL). A negative result must be combined with clinical observations, patient history, and epidemiological information. The expected result is Negative.  Fact Sheet for Patients:  EntrepreneurPulse.com.au  Fact Sheet for Healthcare Providers:  IncredibleEmployment.be  This test is no t yet approved or cleared by the [place] FDA and  has been authorized for detection and/or diagnosis of SARS-CoV-2 by FDA under an Emergency Use Authorization (EUA). This EUA will remain  in effect (meaning this test can be used) for the duration of the COVID-19 declaration under Section 564(b)(1) of the Act, 21 U.S.C.section 360bbb-3(b)(1), unless the authorization is terminated  or revoked sooner.       Influenza A by PCR NEGATIVE NEGATIVE Final   Influenza B by PCR NEGATIVE NEGATIVE Final    Comment: (NOTE) The Xpert Xpress SARS-CoV-2/FLU/RSV plus assay is intended as an aid in the diagnosis of influenza from Nasopharyngeal swab specimens and should not be used as a sole basis for treatment. Nasal washings and aspirates are unacceptable for Xpert Xpress SARS-CoV-2/FLU/RSV testing.  Fact Sheet for Patients: EntrepreneurPulse.com.au  Fact Sheet for Healthcare Providers: IncredibleEmployment.be  This test is not yet approved or cleared by the [place] FDA and has been authorized for detection and/or diagnosis of SARS-CoV-2 by FDA under an Emergency Use Authorization (EUA). This EUA will remain in effect (meaning this test can be used) for the duration of the COVID-19 declaration  under Section 564(b)(1) of the Act, 21 U.S.C. section 360bbb-3(b)(1), unless the authorization is terminated or revoked.  Performed at [facility], [address] [place], [place] [address]          Radiology Studies: DG Chest Portable 1 View  Result Date: 07/20/2021 CLINICAL DATA:  Weakness, slurred speech EXAM: PORTABLE CHEST 1 VIEW COMPARISON:  03/31/2021 FINDINGS: Lingular scarring/atelectasis. Right lung is clear. No pleural effusion or pneumothorax. Mild cardiomegaly. Degenerative changes of the bilateral shoulders. IMPRESSION: No evidence of acute cardiopulmonary disease. Mild cardiomegaly. Electronically Signed   By: Julian Hy M.D.   On: 07/20/2021 02:01   CT HEAD CODE STROKE WO CONTRAST  Result Date: 07/20/2021 CLINICAL DATA:  Code stroke.  Speech changes EXAM: CT HEAD WITHOUT CONTRAST TECHNIQUE: Contiguous axial images were obtained from the base of the skull through the vertex without intravenous contrast. COMPARISON:  None.  FINDINGS: Brain: There is no mass, hemorrhage or extra-axial collection. The size and configuration of the ventricles and extra-axial CSF spaces are normal. The brain parenchyma is normal, without evidence of acute or chronic infarction. Vascular: No abnormal hyperdensity of the major intracranial arteries or dural venous sinuses. No intracranial atherosclerosis. Skull: The visualized skull base, calvarium and extracranial soft tissues are normal. Sinuses/Orbits: No fluid levels or advanced mucosal thickening of the visualized paranasal sinuses. No mastoid or middle ear effusion. The orbits are normal. ASPECTS [facility] Stroke Program Early CT Score) - Ganglionic level infarction (caudate, lentiform nuclei, internal capsule, insula, M1-M3 cortex): 7 - Supraganglionic infarction (M4-M6 cortex): 3 Total score (0-10 with 10 being normal): 10 IMPRESSION: 1. Normal head CT. 2. ASPECTS is 10. These results were called by telephone at the time of interpretation on  07/20/2021 at 1:06 am to provider [facility] , who verbally acknowledged these results. Electronically Signed   By: Ulyses Jarred M.D.   On: 07/20/2021 01:06        Scheduled Meds:  pantoprazole (PROTONIX) IV  40 mg Intravenous Daily   Continuous Infusions:  potassium chloride 10 mEq (07/20/21 0657)     LOS: 0 days   The patient is critically ill with multiple organ systems failure and requires high complexity decision making for assessment and support, frequent evaluation and titration of therapies, application of advanced monitoring technologies and extensive interpretation of multiple databases. Critical Care Time devoted to patient care services described in this note  Time spent: 25 minutes     Harlei Lehrmann, Geraldo Docker, MD [facility]   If 7PM-7AM, please contact night-coverage 07/20/2021, 7:35 AM

## 2021-07-20 NOTE — TOC Initial Note (Signed)
Transition of Care [facility]) - Initial/Assessment Note    Patient Details  Name: Nicole [name] MRN: [id number] Date of Birth: 1945-11-04  [facility] CM/SW Contact:    Ihor Gully, LCSW Phone Number: 07/20/2021, 2:27 PM  Clinical Narrative:                 Patient from home alone. At baseline, uses wheelchair, transfers independently, and uses walker in the bathroom to complete ADLs independently. Patient has paid to move in to [facility] the end of the month. Discussed PT recommendation. Agreeable to STR prior to moving to ALF.  Referral sent to agencies requested.    Expected Discharge Plan: Skilled Nursing Facility Barriers to Discharge: Continued Medical Work up   Patient Goals and CMS Choice Patient states their goals for this hospitalization and ongoing recovery are:: rehab then go to ALF   Choice offered to / list presented to : Adult Children  Expected Discharge Plan and Services Expected Discharge Plan: [place] Acute Care Choice: [place] Living arrangements for the past 2 months: Single Family Home                                      Prior Living Arrangements/Services Living arrangements for the past 2 months: Single Family Home Lives with:: Self Patient language and need for interpreter reviewed:: Yes Do you feel safe going back to the place where you live?: Yes      Need for Family Participation in Patient Care: Yes (Comment) Care giver support system in place?: Yes (comment) Current home services: DME Criminal Activity/Legal Involvement Pertinent to Current Situation/Hospitalization: No - Comment as needed  Activities of Daily Living Home Assistive Devices/Equipment: Walker (specify type) ADL Screening (condition at time of admission) Patient's cognitive ability adequate to safely complete daily activities?: No Is the patient deaf or  have difficulty hearing?: No Does the patient have difficulty seeing, even when wearing glasses/contacts?: No Does the patient have difficulty concentrating, remembering, or making decisions?: Yes Patient able to express need for assistance with ADLs?: No Does the patient have difficulty dressing or bathing?: Yes Independently performs ADLs?: Yes (appropriate for developmental age) Does the patient have difficulty walking or climbing stairs?: Yes Weakness of Legs: Both Weakness of Arms/Hands: None  Permission Sought/Granted Permission sought to share information with : Family Supports    Share Information with NAME: son and daughter           Emotional Assessment       Orientation: : Oriented to Self Alcohol / Substance Use: Not Applicable Psych Involvement: No (comment)  Admission diagnosis:  Aphasia [O17.51] Acute metabolic encephalopathy [W25.85] Patient Active Problem List   Diagnosis Date Noted   Acute metabolic encephalopathy 27/78/2423   Hypokalemia 07/20/2021   Elevated MCV 07/20/2021   Mixed hyperlipidemia 07/20/2021   Diverticulitis 05/02/2018   Diverticulitis of colon 05/01/2018   Morbid obesity with BMI of 50.0-59.9, adult ([place]) 05/01/2018   Chronic diastolic HF (heart failure) ([place]) 05/01/2018   On prednisone therapy 05/31/2017   Positive anti-CCP test 05/31/2017   Rheumatoid factor positive 05/31/2017   Memory loss 04/04/2017   Polymyalgia rheumatica ([place]) 04/04/2017   High risk medication use 12/07/2016   DDD (degenerative disc disease), lumbar 09/27/2016   HTN (hypertension) 06/05/2016   Chronic pain syndrome 06/05/2016  Acute diverticulitis 06/02/2016   Diverticulitis large intestine 06/02/2016   Spinal stenosis of lumbosacral region 02/29/2016   Joint pain 02/29/2016   Endometrial polyp 02/06/2014   Postmenopausal bleeding 01/30/2014   GERD (gastroesophageal reflux disease) 09/29/2013   Spinal stenosis, thoracic 09/29/2013   Shingles  09/29/2013   Thoracic or lumbosacral neuritis or radiculitis, unspecified 05/04/2011   Abnormality of gait 05/04/2011   Muscle weakness (generalized) 05/04/2011   Bilateral primary osteoarthritis of knee 04/07/2009   KNEE PAIN 04/07/2009   PCP:  Jake Samples, PA-C Pharmacy:   [facility], [place] [address] Phone: [phone] Fax: [phone]  [facility] [place], [place] [address] [place] [address] Phone: [phone] Fax: [phone]     Social Determinants of Health (SDOH) Interventions    Readmission Risk Interventions No flowsheet data found.

## 2021-07-20 NOTE — Progress Notes (Signed)
SLP Cancellation Note  Patient Details Name: Malka Bocek MRN: [id number] DOB: 1946-02-15   Cancelled treatment:       Reason Eval/Treat Not Completed: SLP screened, no needs identified, will sign off (Pt passed [facility] swallow screen this AM and diet has been ordered. SLP discussed with MD/RN and will cancel order at this time. Reconsult if needs arise.)  Thank you,  Genene Churn, [place]  [place] 07/20/2021, 11:59 AM

## 2021-07-20 NOTE — Plan of Care (Signed)
  Problem: Acute Rehab PT Goals(only PT should resolve) Goal: Pt Will Go Supine/Side To Sit Outcome: Progressing Flowsheets (Taken 07/20/2021 1215) Pt will go Supine/Side to Sit: with supervision Goal: Patient Will Transfer Sit To/From Stand Outcome: Progressing Flowsheets (Taken 07/20/2021 1215) Patient will transfer sit to/from stand: with minimal assist Goal: Pt Will Transfer Bed To Chair/Chair To Bed Outcome: Progressing Flowsheets (Taken 07/20/2021 1215) Pt will Transfer Bed to Chair/Chair to Bed: with min assist Goal: Pt Will Ambulate Outcome: Progressing Flowsheets (Taken 07/20/2021 1215) Pt will Ambulate:  15 feet  with minimal assist  with moderate assist  with rolling walker   12:15 PM, 07/20/21 Lonell Grandchild, MPT Physical Therapist with [facility] [phone] office [phone] mobile phone

## 2021-07-20 NOTE — ED Triage Notes (Signed)
Per family pt started not talking like normal around 2230.

## 2021-07-20 NOTE — Progress Notes (Signed)
*  PRELIMINARY RESULTS* Echocardiogram 2D Echocardiogram has been performed.  Elpidio Anis 07/20/2021, 9:30 AM

## 2021-07-20 NOTE — Consult Note (Signed)
[facility] Tele[facility]urology Consult Services   Date of Service:   07/20/2021 00:49:41  Diagnosis:       G93.49 - Encephalopathy Multifactorial  Impression:      Pt with AMS, LKW 9/26 at 2000 and was noted to be lethargic and slow to respond today. She is wheelchairbound at baseline. She was more confused tonight (and some question about incorrectly taking meds). She is very confused and cannot cooperate with exam. No other focal findings, chronic LE weakness is present.  Encephalopathy suspected, though ddx includes stroke. Not candidate for thrombolytics or NIR due to LKW and baseline functional status.  Recommend admission, medical workup, MRI brain w/o on a routine basis, ASA 325.  Metrics: Last Known Well: 07/18/2021 20:00:00 [facility] Notification Time: 07/20/2021 00:49:41 Arrival Time: 07/20/2021 00:43:00 Stamp Time: 07/20/2021 00:49:41 Initial Response Time: 07/20/2021 00:50:51 Symptoms: confusion. NIHSS Start Assessment Time: 07/20/2021 01:00:44 Patient is not a candidate for Thrombolytic. Thrombolytic Medical Decision: 07/20/2021 01:00:00 Patient was not deemed candidate for Thrombolytic because of following reasons: Last Well Known Above 4.5 Hours.  CT head was reviewed and results were: I personally Reviewed the CT Head and it Showed no acute process  ED Physician notified of diagnostic impression and management plan on 07/20/2021 01:19:45  Advanced Imaging: Advanced Imaging Not Completed because:  LKW > 24 hours, baseline mrS 4   Our recommendations are outlined below.  Recommendations:        Stroke/Telemetry Floor       Neuro Checks       Bedside Swallow Eval       DVT Prophylaxis       IV Fluids, Normal Saline       Head of Bed 30 Degrees       Euglycemia and Avoid Hyperthermia (PRN Acetaminophen)       Initiate or continue Aspirin 325 MG daily       Antihypertensives PRN if Blood pressure is greater than 220/120 or there is a  concern for End organ damage/contraindications for permissive HTN. If blood pressure is greater than 220/120 give labetalol PO or IV or Vasotec IV with a goal of 15% reduction in BP during the first 24 hours.  Routine Consultation with [facility] for Follow up Care  Sign Out:       Discussed with Emergency Department Provider    ------------------------------------------------------------------------------  History of Present Illness: Patient is a 75 year old Female.  Patient was brought by EMS for symptoms of confusion.  Pt has a h/o HTN, GERD . Her son noted that throughout the day on Tuesday she was slower to respond and groggy. She later was noted to have elelvated BP when family checked on her. She was more confused throughout the day Tuesday. She told her family member this evening that she thought she might have taken her medication incorrectly (including pain pills). EMS also noted a smell of urine. She has a h/o HTN, CHF. She   Past Medical History:      Hypertension      Coronary Artery Disease      There is NO history of Diabetes Mellitus      There is NO history of Hyperlipidemia      There is NO history of Atrial Fibrillation      There is NO history of Stroke  Anticoagulant use:  No  Antiplatelet use: No  Allergies:  Reviewed    Examination: BP(169/108), Pulse(95), Blood Glucose(84) 1A: Level of Consciousness - Arouses to minor stimulation +  1 1B: Ask Month and Age - Could Not Answer Either Question Correctly + 2 1C: Blink Eyes & Squeeze Hands - Performs 1 Task + 1 2: Test Horizontal Extraocular Movements - Normal + 0 3: Test Visual Fields - No Visual Loss + 0 4: Test Facial Palsy (Use Grimace if Obtunded) - Normal symmetry + 0 5A: Test Left Arm Motor Drift - No Drift for 10 Seconds + 0 5B: Test Right Arm Motor Drift - No Drift for 10 Seconds + 0 6A: Test Left Leg Motor Drift - No Effort Against Gravity + 3 6B: Test Right Leg Motor Drift - No Effort  Against Gravity + 3 7: Test Limb Ataxia (FNF/Heel-Shin) - No Ataxia + 0 8: Test Sensation - Normal; No sensory loss + 0 9: Test Language/Aphasia - Severe Aphasia: Fragmentary Expression, Inference Needed, Cannot Identify Materials + 2 10: Test Dysarthria - Mild-Moderate Dysarthria: Slurring but can be understood + 1 11: Test Extinction/Inattention - No abnormality + 0  NIHSS Score: 13   Pre-Morbid Modified Rankin Scale: 4 Points = Moderately severe disability; unable to walk and attend to bodily needs without assistance   Patient/Family was informed the Neurology Consult would occur via TeleHealth consult by way of interactive audio and video telecommunications and consented to receiving care in this manner.   Patient is being evaluated for possible acute neurologic impairment and high probability of imminent or life-threatening deterioration. I spent total of 36 minutes providing care to this patient, including time for face to face visit via telemedicine, review of medical records, imaging studies and discussion of findings with providers, the patient and/or family.   Dr Jeralene Huff   [facility] [phone]  Case [id number]

## 2021-07-20 NOTE — Progress Notes (Signed)
Code stroke  Call time   [address]    [place] Start    [place] End    [place] [facility]    [employer] Epic    [place] [facility]    [phone]

## 2021-07-20 NOTE — Plan of Care (Signed)
  Problem: Acute Rehab OT Goals (only OT should resolve) Goal: Pt. Will Perform Grooming Flowsheets (Taken 07/20/2021 1030) Pt Will Perform Grooming:  standing  with mod assist Goal: Pt. Will Perform Lower Body Bathing Flowsheets (Taken 07/20/2021 1030) Pt Will Perform Lower Body Bathing:  with min assist  sitting/lateral leans  with adaptive equipment Goal: Pt. Will Perform Upper Body Dressing Flowsheets (Taken 07/20/2021 1030) Pt Will Perform Upper Body Dressing:  with modified independence  sitting Goal: Pt. Will Perform Lower Body Dressing Flowsheets (Taken 07/20/2021 1030) Pt Will Perform Lower Body Dressing:  with min assist  with adaptive equipment  sitting/lateral leans Goal: Pt. Will Transfer To Toilet Flowsheets (Taken 07/20/2021 1030) Pt Will Transfer to Toilet:  with min assist  with mod assist  stand pivot transfer  bedside commode Goal: Pt/Caregiver Will Perform Home Exercise Program Flowsheets (Taken 07/20/2021 1030) Pt/caregiver will Perform Home Exercise Program:  Increased ROM  Both right and left upper extremity  Increased strength  With Supervision  Charlii Yost OT, MOT

## 2021-07-20 NOTE — NC FL2 (Signed)
[place] LEVEL OF CARE SCREENING TOOL     IDENTIFICATION  Patient Name: Nicole [name] Birthdate: July 13, 1946 Sex: female Admission Date (Current Location): 07/20/2021  [facility] and [place] Number:  [employer] and Address:  [facility] [address] [place]      Provider Number: [id number]  Attending Physician Name and Address:  Allie Bossier, MD  Relative Name and Phone Number:  Felicitas Sine [facility])            [phone]    Current Level of Care: Hospital (observation) Recommended Level of Care: [place] Prior Approval Number:    Date Approved/Denied:   PASRR Number: [id number]  Discharge Plan: SNF    Current Diagnoses: Patient Active Problem List   Diagnosis Date Noted   Acute metabolic encephalopathy 42/70/6237   Hypokalemia 07/20/2021   Elevated MCV 07/20/2021   Mixed hyperlipidemia 07/20/2021   Diverticulitis 05/02/2018   Diverticulitis of colon 05/01/2018   Morbid obesity with BMI of 50.0-59.9, adult ([place]) 05/01/2018   Chronic diastolic HF (heart failure) ([place]) 05/01/2018   On prednisone therapy 05/31/2017   Positive anti-CCP test 05/31/2017   Rheumatoid factor positive 05/31/2017   Memory loss 04/04/2017   Polymyalgia rheumatica ([place]) 04/04/2017   High risk medication use 12/07/2016   DDD (degenerative disc disease), lumbar 09/27/2016   HTN (hypertension) 06/05/2016   Chronic pain syndrome 06/05/2016   Acute diverticulitis 06/02/2016   Diverticulitis large intestine 06/02/2016   Spinal stenosis of lumbosacral region 02/29/2016   Joint pain 02/29/2016   Endometrial polyp 02/06/2014   Postmenopausal bleeding 01/30/2014   GERD (gastroesophageal reflux disease) 09/29/2013   Spinal stenosis, thoracic 09/29/2013   Shingles 09/29/2013   Thoracic or lumbosacral neuritis or radiculitis, unspecified 05/04/2011   Abnormality of gait 05/04/2011   Muscle weakness (generalized) 05/04/2011    Bilateral primary osteoarthritis of knee 04/07/2009   KNEE PAIN 04/07/2009    Orientation RESPIRATION BLADDER Height & Weight     Self  Normal Continent Weight: 262 lb 9.1 oz (119.1 kg) Height:  5\' 1"  (154.9 cm)  BEHAVIORAL SYMPTOMS/MOOD NEUROLOGICAL BOWEL NUTRITION STATUS      Continent Diet (heart healthy/carb modified)  AMBULATORY STATUS COMMUNICATION OF NEEDS Skin   Extensive Assist Verbally Normal                       Personal Care Assistance Level of Assistance  Bathing, Feeding, Dressing Bathing Assistance: Limited assistance Feeding assistance: Independent Dressing Assistance: Limited assistance     Functional Limitations Info  Sight, Hearing, Speech Sight Info: Adequate Hearing Info: Adequate Speech Info: Adequate    SPECIAL CARE FACTORS FREQUENCY  PT (By licensed PT), OT (By licensed OT)     PT Frequency: 5x/week OT Frequency: 3x/week            Contractures Contractures Info: Not present    Additional Factors Info  Code Status, Allergies Code Status Info: Full code Allergies Info: Oxycontin, Sulfa Antibiotics, Tramadol, Penicillins, Sulfasalazine           Current Medications (07/20/2021):  This is the current hospital active medication list Current Facility-Administered Medications  Medication Dose Route Frequency Provider Last Rate Last Admin   acetaminophen (TYLENOL) tablet 650 mg  650 mg Oral Q6H PRN Adefeso, Oladapo, DO       pantoprazole (PROTONIX) injection 40 mg  40 mg Intravenous Daily Adefeso, Oladapo, DO   40 mg at 07/20/21 1004   traMADol (ULTRAM)  tablet 50 mg  50 mg Oral Q6H PRN Allie Bossier, MD   50 mg at 07/20/21 1003     Discharge Medications: Please see discharge summary for a list of discharge medications.  Relevant Imaging Results:  Relevant Lab Results:   Additional Information SS# [id number]  Ihor Gully, LCSW

## 2021-07-20 NOTE — ED Provider Notes (Signed)
[place] UNIT Provider Note   CSN: [id number] Arrival date & time: 07/20/21  0043     History Chief Complaint  Patient presents with  . Code Stroke    Nicole [name] is a 75 y.o. female.  75 year old female who was initially seen as a code stroke secondary to her aphasia.  Initially thought that she was last known well at 230.  And she had a arrived here a couple hours after that.  Code stroke was continued.  When other family arrived they stated that actually her last known well was Monday at 2000 and she actually was talking abnormally earlier in the day but it progressively worsened.  At the time of family arrival they also state that she might of had a mixup with her medications and taken too much of her pain medication.  Her voice and speech does seem to be improving significantly.        Past Medical History:  Diagnosis Date  . AC (acromioclavicular) joint bone spurs    lt shoulder  . Anemia   . Arthritis   . Carpal tunnel syndrome, bilateral   . CHF (congestive heart failure) ([place])   . Collagen vascular disease ([place])   . Gastroesophageal reflux   . Headache    recent visit to ER @ [facility] for severe headache  . Hypertension   . Lumbar stenosis    Hx of ESIs by Dr. Nelva Bush  . Polyarthralgia   . Polymyalgia ([place])   . Shingles   . Spinal stenosis     Patient Active Problem List   Diagnosis Date Noted  . Acute metabolic encephalopathy 51/11/5850  . Hypokalemia 07/20/2021  . Elevated MCV 07/20/2021  . Mixed hyperlipidemia 07/20/2021  . Diverticulitis 05/02/2018  . Diverticulitis of colon 05/01/2018  . Morbid obesity with BMI of 50.0-59.9, adult ([place]) 05/01/2018  . Chronic diastolic HF (heart failure) ([place]) 05/01/2018  . On prednisone therapy 05/31/2017  . Positive anti-CCP test 05/31/2017  . Rheumatoid factor positive 05/31/2017  . Memory loss 04/04/2017  . Polymyalgia rheumatica ([place]) 04/04/2017  . High risk medication use 12/07/2016  .  DDD (degenerative disc disease), lumbar 09/27/2016  . HTN (hypertension) 06/05/2016  . Chronic pain syndrome 06/05/2016  . Acute diverticulitis 06/02/2016  . Diverticulitis large intestine 06/02/2016  . Spinal stenosis of lumbosacral region 02/29/2016  . Joint pain 02/29/2016  . Endometrial polyp 02/06/2014  . Postmenopausal bleeding 01/30/2014  . GERD (gastroesophageal reflux disease) 09/29/2013  . Spinal stenosis, thoracic 09/29/2013  . Shingles 09/29/2013  . Thoracic or lumbosacral neuritis or radiculitis, unspecified 05/04/2011  . Abnormality of gait 05/04/2011  . Muscle weakness (generalized) 05/04/2011  . Bilateral primary osteoarthritis of knee 04/07/2009  . KNEE PAIN 04/07/2009    Past Surgical History:  Procedure Laterality Date  . BACK SURGERY  07/05/2018, 07/2018   x2   . BIOPSY  09/22/2020   Procedure: BIOPSY;  Surgeon: Rogene Houston, MD;  [facility];  Service: Endoscopy;;  . CHOLECYSTECTOMY    . COLONOSCOPY  06/11/2012   Procedure: COLONOSCOPY;  Surgeon: Jamesetta So, MD;  Location: [facility];  Service: Gastroenterology;  Laterality: N/A;  . COLONOSCOPY WITH PROPOFOL N/A 09/22/2020   Procedure: COLONOSCOPY WITH PROPOFOL;  Surgeon: Rogene Houston, MD;  Location: [facility];  Service: Endoscopy;  Laterality: N/A;  730  . HYSTEROSCOPY WITH D & C N/A 02/25/2014   Procedure: DILATATION AND CURETTAGE /HYSTEROSCOPY;  Surgeon: Florian Buff, MD;  Location: [facility];  Service: Gynecology;  Laterality: N/A;  . POLYPECTOMY N/A 02/25/2014   Procedure: POLYPECTOMY;  Surgeon: Florian Buff, MD;  Location: [facility];  Service: Gynecology;  Laterality: N/A;  . RESECTION DISTAL CLAVICAL Right 03/26/2015   Procedure: OPEN DISTAL CLAVICAL RESECTION ;  Surgeon: Netta Cedars, MD;  Location: [facility];  Service: Orthopedics;  Laterality: Right;     OB History     Gravida  2   Para      Term      Preterm      AB      Living  2      SAB      IAB      Ectopic       Multiple      Live Births              Family History  Problem Relation Age of Onset  . Hypertension Mother   . Pneumonia Father   . Arthritis Sister   . Diabetes Paternal Grandmother     Social History   Tobacco Use  . Smoking status: Never  . Smokeless tobacco: Never  Vaping Use  . Vaping Use: Never used  Substance Use Topics  . Alcohol use: No  . Drug use: No    Home Medications Prior to Admission medications   Medication Sig Start Date End Date Taking? Authorizing Provider  acetaminophen (TYLENOL) 325 MG tablet Take 2 tablets (650 mg total) by mouth every 6 (six) hours as needed for mild pain or headache (or Fever >/= 101). 05/06/18   Barton Dubois, MD  atorvastatin (LIPITOR) 20 MG tablet Take 20 mg by mouth daily. 01/13/21   [provider]  calcitRIOL (ROCALTROL) 0.25 MCG capsule Take 0.25 mcg by mouth See admin instructions. Take 0.25 every Mon, Wed, Fri. 09/06/20   [provider]  cyclobenzaprine (FLEXERIL) 5 MG tablet Take 5 mg by mouth every 12 (twelve) hours as needed for muscle spasms.  02/20/20   [provider]  diclofenac (VOLTAREN) 75 MG EC tablet Take 75 mg by mouth daily. 07/09/19   [provider]  diclofenac Sodium (VOLTAREN) 1 % GEL Apply 2 g topically 4 (four) times daily as needed (pain).     [provider]  gabapentin (NEURONTIN) 400 MG capsule Take 400 mg by mouth 3 (three) times daily.  06/17/19   [provider]  leflunomide (ARAVA) 10 MG tablet TAKE (1) TABLET BY MOUTH DAILY. Patient taking differently: Take 10 mg by mouth daily. 12/27/20   Ofilia Neas, PA-C  lidocaine (LIDODERM) 5 % Place 1-2 patches onto the skin every 12 (twelve) hours as needed (pain).  02/19/20   [provider]  metolazone (ZAROXOLYN) 5 MG tablet Take 5 mg by mouth 2 (two) times a week.    [provider]  Multiple Vitamins-Minerals (MULTIVITAMIN PO) Take 1 tablet by mouth daily.     [provider]  pantoprazole (PROTONIX) 40 MG tablet Take 40 mg by mouth 2 (two) times daily.  08/20/13   Milton Ferguson, MD  potassium chloride (KLOR-CON) 10 MEQ tablet Take 10 mEq by mouth daily. 09/06/20   [provider]  predniSONE (DELTASONE) 1 MG tablet 4 tabs QD x 1 month (total 9 mg daily), then 3 tabs QD x 1 month (total 8 mg daily), then 2 tabs QD x 1 month (total 7 mg daily), then 1 tab QD x 1 month (total 6mg  daily). Take in combination  with one 5mg  tablet. 05/12/21   Bo Merino, MD  Sarilumab [facility]) 200 MG/1.14ML SOAJ Inject 200 mg into the skin every 14 (fourteen) days. 05/09/21   Ofilia Neas, PA-C  Simethicone (GAS-X PO) Take 1 tablet by mouth 3 (three) times daily as needed (flatulence).     [provider]  tapentadol (NUCYNTA) 50 MG tablet Take 50 mg by mouth daily as needed for moderate pain.    [provider]  Tapentadol HCl (NUCYNTA ER) 200 MG TB12 Take 200 mg by mouth every 12 (twelve) hours.    [provider]  torsemide (DEMADEX) 100 MG tablet Take 100 mg by mouth 2 (two) times daily.    [provider]    Allergies    Oxycontin [oxycodone hcl], Sulfa antibiotics, Tramadol, Penicillins, and Sulfasalazine  Review of Systems   Review of Systems  All other systems reviewed and are negative.  Physical Exam Updated Vital Signs BP (!) 163/98 (BP Location: Right Arm)   Pulse 93   Temp 98.3 F (36.8 C) (Oral)   Resp 18   SpO2 (P) 95%   Physical Exam Vitals and nursing note reviewed.  Constitutional:      Appearance: She is well-developed.  HENT:     Head: Normocephalic and atraumatic.     Mouth/Throat:     Mouth: Mucous membranes are moist.     Pharynx: Oropharynx is clear.  Eyes:     Pupils: Pupils are equal, round, and reactive to light.  Cardiovascular:     Rate and Rhythm: Normal rate and regular rhythm.  Pulmonary:     Effort: No respiratory distress.     Breath sounds: No stridor.  Abdominal:      General: Abdomen is flat. There is no distension.  Musculoskeletal:        General: No swelling or tenderness. Normal range of motion.     Cervical back: Normal range of motion.  Skin:    General: Skin is warm and dry.  Neurological:     General: No focal deficit present.     Mental Status: She is alert.    ED Results / Procedures / Treatments   Labs (all labs ordered are listed, but only abnormal results are displayed) Labs Reviewed  APTT - Abnormal; Notable for the following components:      Result Value   aPTT 22 (*)    All other components within normal limits  CBC - Abnormal; Notable for the following components:   MCV 102.9 (*)    All other components within normal limits  COMPREHENSIVE METABOLIC PANEL - Abnormal; Notable for the following components:   Potassium 3.4 (*)    Total Protein 6.3 (*)    All other components within normal limits  RESP PANEL BY RT-PCR (FLU A&B, COVID) ARPGX2  ETHANOL  PROTIME-INR  DIFFERENTIAL  RAPID URINE DRUG SCREEN, HOSP PERFORMED  URINALYSIS, ROUTINE W REFLEX MICROSCOPIC  AMMONIA  LIPID PANEL  MAGNESIUM  PHOSPHORUS  HEMOGLOBIN A1C  VITAMIN B12  FOLATE    EKG None  Radiology DG Chest Portable 1 View  Result Date: 07/20/2021 CLINICAL DATA:  Weakness, slurred speech EXAM: PORTABLE CHEST 1 VIEW COMPARISON:  03/31/2021 FINDINGS: Lingular scarring/atelectasis. Right lung is clear. No pleural effusion or pneumothorax. Mild cardiomegaly. Degenerative changes of the bilateral shoulders. IMPRESSION: No evidence of acute cardiopulmonary disease. Mild cardiomegaly. Electronically Signed   By: Julian Hy M.D.   On: 07/20/2021 02:01   CT HEAD CODE STROKE WO CONTRAST  Result Date: 07/20/2021 CLINICAL DATA:  Code stroke.  Speech changes EXAM: CT HEAD WITHOUT CONTRAST TECHNIQUE: Contiguous axial images were obtained from the base of the skull through the vertex without intravenous contrast. COMPARISON:  None. FINDINGS: Brain: There is no  mass, hemorrhage or extra-axial collection. The size and configuration of the ventricles and extra-axial CSF spaces are normal. The brain parenchyma is normal, without evidence of acute or chronic infarction. Vascular: No abnormal hyperdensity of the major intracranial arteries or dural venous sinuses. No intracranial atherosclerosis. Skull: The visualized skull base, calvarium and extracranial soft tissues are normal. Sinuses/Orbits: No fluid levels or advanced mucosal thickening of the visualized paranasal sinuses. No mastoid or middle ear effusion. The orbits are normal. ASPECTS [facility] Stroke Program Early CT Score) - Ganglionic level infarction (caudate, lentiform nuclei, internal capsule, insula, M1-M3 cortex): 7 - Supraganglionic infarction (M4-M6 cortex): 3 Total score (0-10 with 10 being normal): 10 IMPRESSION: 1. Normal head CT. 2. ASPECTS is 10. These results were called by telephone at the time of interpretation on 07/20/2021 at 1:06 am to provider [facility] , who verbally acknowledged these results. Electronically Signed   By: Ulyses Jarred M.D.   On: 07/20/2021 01:06    Procedures Procedures   Medications Ordered in ED Medications  potassium chloride 10 mEq in 100 mL IVPB (10 mEq Intravenous New Bag/Given 07/20/21 0657)  pantoprazole (PROTONIX) injection 40 mg (has no administration in time range)  acetaminophen (TYLENOL) tablet 650 mg (has no administration in time range)    ED Course  I have reviewed the triage vital signs and the nursing notes.  Pertinent labs & imaging results that were available during my care of the patient were reviewed by me and considered in my medical decision making (see chart for details).    MDM Rules/Calculators/A&P                         Teleneurology thought this is more likely to be encephalopathic however on her work-up here is negative.  She still needs MRIs and further monitoring so I discussed with the hospitalist for observation to complete  the same.   Final Clinical Impression(s) / ED Diagnoses Final diagnoses:  Aphasia    Rx / DC Orders ED Discharge Orders     None        Chima Astorino, Corene Cornea, MD 07/20/21 [phone]

## 2021-07-20 NOTE — Evaluation (Signed)
Physical Therapy Evaluation Patient Details Name: Nicole [name] MRN: [id number] DOB: 01/16/1946 Today's Date: 07/20/2021  History of Present Illness  Nicole [name] is a 75 y.o. female with medical history significant for chronic pain syndrome, hypertension, hyperlipidemia, GERD who presents to the emergency department due to altered mental status.  Patient was last well-known at 8 PM on Monday (9/26).  Patient was seen by son around 2 PM yesterday (9/27) and she was more confused, lethargic and slow to respond, BP was checked and was noted to be elevated.  It was uncertain if this was related to patient incorrectly taking meds.  At baseline, patient is wheelchair-bound.  There was no report of fever, chills, nausea, vomiting, chest pain or shortness of breath.   Clinical Impression  Patient demonstrates labored movement for sitting up at bedside with good return for using BUE, once seated patient c/o severe in BLE and unable to attempt sit to standing or transferring to chair.  Patient put back to bed with Max assist to reposition mostly due to BLE pain.  Patient will benefit from continued physical therapy in hospital and recommended venue below to increase strength, balance, endurance for safe ADLs and gait.        Recommendations for follow up therapy are one component of a multi-disciplinary discharge planning process, led by the attending physician.  Recommendations may be updated based on patient status, additional functional criteria and insurance authorization.  Follow Up Recommendations SNF;Supervision for mobility/OOB;Supervision - Intermittent    Equipment Recommendations  None recommended by PT    Recommendations for Other Services       Precautions / Restrictions Precautions Precautions: Fall Restrictions Weight Bearing Restrictions: No      Mobility  Bed Mobility Overal bed mobility: Needs Assistance Bed Mobility: Supine to Sit;Sit to Supine;Rolling Rolling: Min assist    Supine to sit: Min assist Sit to supine: Mod assist;Min assist   General bed mobility comments: slow labored movement, limited mostly due to c/o severe pain in BLE    Transfers                    Ambulation/Gait                Stairs            Wheelchair Mobility    Modified Rankin (Stroke Patients Only)       Balance Overall balance assessment: Needs assistance Sitting-balance support: Feet supported;No upper extremity supported Sitting balance-Leahy Scale: Fair Sitting balance - Comments: Seated at EOB.                                     Pertinent Vitals/Pain Pain Assessment: Faces Faces Pain Scale: Hurts whole lot Pain Location: Bilateral LE; pt also reported pain in middle back Pain Descriptors / Indicators: Moaning;Guarding;Grimacing Pain Intervention(s): Limited activity within patient's tolerance;Monitored during session;Repositioned;Patient requesting pain meds-RN notified    Home Living Family/patient expects to be discharged to:: Private residence Living Arrangements: Alone Available Help at Discharge: Family;Personal care attendant;Available 24 hours/day Type of Home: House Home Access: Stairs to enter Entrance Stairs-Rails: None Entrance Stairs-Number of Steps: 1 Home Layout: One level Home Equipment: Walker - 4 wheels;Bedside commode;Wheelchair - manual Additional Comments: info taken mostly from previous admission    Prior Function Level of Independence: Needs assistance   Gait / Transfers Assistance Needed: short household distances using RW, uses w/c mostly  ADL's / Homemaking Assistance Needed: sitters 24/7 per patient        Hand Dominance   Dominant Hand: Right    Extremity/Trunk Assessment   Upper Extremity Assessment Upper Extremity Assessment: Defer to OT evaluation RUE Deficits / Details: A/ROM of shoulder flexion WFL in supine with HOB elevated. WFL P/ROM shoulder flexion and abduction. Pt  able to bear weight through B UE when seated at EOB. LUE Deficits / Details: ~90* shoulder flexion A/ROM supine with HOB elevated. P/ROm limited to ~145* for shoulder flexion and abduction. Pt able to bear weight through B UE when seated at EOB.    Lower Extremity Assessment Lower Extremity Assessment: Generalized weakness    Cervical / Trunk Assessment Cervical / Trunk Assessment: Normal  Communication   Communication: Expressive difficulties  Cognition Arousal/Alertness: Awake/alert Behavior During Therapy: WFL for tasks assessed/performed Overall Cognitive Status: No family/caregiver present to determine baseline cognitive functioning                                 General Comments: Pt appeared to be cognitively sound but was having trouble with expressive communication.      General Comments      Exercises     Assessment/Plan    PT Assessment Patient needs continued PT services  PT Problem List Decreased strength;Decreased activity tolerance;Decreased balance;Decreased mobility       PT Treatment Interventions DME instruction;Gait training;Stair training;Functional mobility training;Therapeutic activities;Therapeutic exercise;Patient/family education;Balance training    PT Goals (Current goals can be found in the Care Plan section)  Acute Rehab PT Goals Patient Stated Goal: return home PT Goal Formulation: With patient Time For Goal Achievement: 08/03/21 Potential to Achieve Goals: Good    Frequency Min 3X/week   Barriers to discharge        Co-evaluation PT/OT/SLP Co-Evaluation/Treatment: Yes Reason for Co-Treatment: Complexity of the patient's impairments (multi-system involvement);To address functional/ADL transfers PT goals addressed during session: Mobility/safety with mobility;Balance OT goals addressed during session: ADL's and self-care;Strengthening/ROM       AM-PAC PT "6 Clicks" Mobility  Outcome Measure Help needed turning from  your back to your side while in a flat bed without using bedrails?: A Little Help needed moving from lying on your back to sitting on the side of a flat bed without using bedrails?: A Little Help needed moving to and from a bed to a chair (including a wheelchair)?: Total Help needed standing up from a chair using your arms (e.g., wheelchair or bedside chair)?: Total Help needed to walk in hospital room?: Total Help needed climbing 3-5 steps with a railing? : Total 6 Click Score: 10    End of Session   Activity Tolerance: Patient limited by fatigue;Patient limited by pain;Patient tolerated treatment well Patient left: in bed;with call bell/phone within reach;with bed alarm set Nurse Communication: Mobility status PT Visit Diagnosis: Unsteadiness on feet (R26.81);Other abnormalities of gait and mobility (R26.89);Muscle weakness (generalized) (M62.81)    Time: 4401-0272 PT Time Calculation (min) (ACUTE ONLY): 20 min   Charges:   PT Evaluation $PT Eval Moderate Complexity: 1 Mod PT Treatments $Therapeutic Activity: 8-22 mins        12:13 PM, 07/20/21 Lonell Grandchild, MPT Physical Therapist with [facility] [phone] office [phone] mobile phone

## 2021-07-21 DIAGNOSIS — G894 Chronic pain syndrome: Secondary | ICD-10-CM | POA: Diagnosis not present

## 2021-07-21 DIAGNOSIS — G9341 Metabolic encephalopathy: Secondary | ICD-10-CM | POA: Diagnosis not present

## 2021-07-21 DIAGNOSIS — R52 Pain, unspecified: Secondary | ICD-10-CM | POA: Diagnosis not present

## 2021-07-21 DIAGNOSIS — M353 Polymyalgia rheumatica: Secondary | ICD-10-CM | POA: Diagnosis not present

## 2021-07-21 DIAGNOSIS — E870 Hyperosmolality and hypernatremia: Secondary | ICD-10-CM | POA: Diagnosis not present

## 2021-07-21 DIAGNOSIS — E785 Hyperlipidemia, unspecified: Secondary | ICD-10-CM | POA: Diagnosis not present

## 2021-07-21 DIAGNOSIS — M6281 Muscle weakness (generalized): Secondary | ICD-10-CM | POA: Diagnosis not present

## 2021-07-21 DIAGNOSIS — I110 Hypertensive heart disease with heart failure: Secondary | ICD-10-CM | POA: Diagnosis not present

## 2021-07-21 DIAGNOSIS — G9340 Encephalopathy, unspecified: Secondary | ICD-10-CM | POA: Diagnosis not present

## 2021-07-21 DIAGNOSIS — R5381 Other malaise: Secondary | ICD-10-CM | POA: Diagnosis not present

## 2021-07-21 DIAGNOSIS — E211 Secondary hyperparathyroidism, not elsewhere classified: Secondary | ICD-10-CM | POA: Diagnosis not present

## 2021-07-21 DIAGNOSIS — E876 Hypokalemia: Secondary | ICD-10-CM | POA: Diagnosis not present

## 2021-07-21 DIAGNOSIS — F4321 Adjustment disorder with depressed mood: Secondary | ICD-10-CM | POA: Diagnosis not present

## 2021-07-21 DIAGNOSIS — Z20822 Contact with and (suspected) exposure to covid-19: Secondary | ICD-10-CM | POA: Diagnosis not present

## 2021-07-21 DIAGNOSIS — I129 Hypertensive chronic kidney disease with stage 1 through stage 4 chronic kidney disease, or unspecified chronic kidney disease: Secondary | ICD-10-CM | POA: Diagnosis not present

## 2021-07-21 DIAGNOSIS — R4701 Aphasia: Secondary | ICD-10-CM | POA: Diagnosis not present

## 2021-07-21 DIAGNOSIS — E873 Alkalosis: Secondary | ICD-10-CM | POA: Diagnosis not present

## 2021-07-21 DIAGNOSIS — N1832 Chronic kidney disease, stage 3b: Secondary | ICD-10-CM | POA: Diagnosis not present

## 2021-07-21 DIAGNOSIS — M79662 Pain in left lower leg: Secondary | ICD-10-CM | POA: Diagnosis not present

## 2021-07-21 DIAGNOSIS — M1380 Other specified arthritis, unspecified site: Secondary | ICD-10-CM | POA: Diagnosis not present

## 2021-07-21 DIAGNOSIS — Z79899 Other long term (current) drug therapy: Secondary | ICD-10-CM | POA: Diagnosis not present

## 2021-07-21 DIAGNOSIS — I509 Heart failure, unspecified: Secondary | ICD-10-CM | POA: Diagnosis not present

## 2021-07-21 DIAGNOSIS — I10 Essential (primary) hypertension: Secondary | ICD-10-CM | POA: Diagnosis not present

## 2021-07-21 DIAGNOSIS — G8929 Other chronic pain: Secondary | ICD-10-CM | POA: Diagnosis not present

## 2021-07-21 DIAGNOSIS — K219 Gastro-esophageal reflux disease without esophagitis: Secondary | ICD-10-CM | POA: Diagnosis not present

## 2021-07-21 DIAGNOSIS — I5032 Chronic diastolic (congestive) heart failure: Secondary | ICD-10-CM | POA: Diagnosis not present

## 2021-07-21 LAB — COMPREHENSIVE METABOLIC PANEL
ALT: 24 U/L (ref 0–44)
AST: 20 U/L (ref 15–41)
Albumin: 3.4 g/dL — ABNORMAL LOW (ref 3.5–5.0)
Alkaline Phosphatase: 53 U/L (ref 38–126)
Anion gap: 9 (ref 5–15)
BUN: 9 mg/dL (ref 8–23)
CO2: 24 mmol/L (ref 22–32)
Calcium: 9.1 mg/dL (ref 8.9–10.3)
Chloride: 106 mmol/L (ref 98–111)
Creatinine, Ser: 0.76 mg/dL (ref 0.44–1.00)
GFR, Estimated: >60 mL/min (ref >60)
Glucose, Bld: 84 mg/dL (ref 70–99)
Potassium: 3.7 mmol/L (ref 3.5–5.1)
Sodium: 139 mmol/L (ref 135–145)
Total Bilirubin: 0.8 mg/dL (ref 0.3–1.2)
Total Protein: 5.6 g/dL — ABNORMAL LOW (ref 6.5–8.1)

## 2021-07-21 LAB — PROTIME-INR
INR: 1 (ref 0.8–1.2)
Prothrombin Time: 12.9 seconds (ref 11.4–15.2)

## 2021-07-21 LAB — CBC WITH DIFFERENTIAL/PLATELET
Abs Immature Granulocytes: 0.01 10*3/uL (ref 0.00–0.07)
Basophils Absolute: 0 10*3/uL (ref 0.0–0.1)
Basophils Relative: 1 %
Eosinophils Absolute: 0.2 10*3/uL (ref 0.0–0.5)
Eosinophils Relative: 4 %
HCT: 41.8 % (ref 36.0–46.0)
Hemoglobin: 13.5 g/dL (ref 12.0–15.0)
Immature Granulocytes: 0 %
Lymphocytes Relative: 42 %
Lymphs Abs: 2.1 10*3/uL (ref 0.7–4.0)
MCH: 32.7 pg (ref 26.0–34.0)
MCHC: 32.3 g/dL (ref 30.0–36.0)
MCV: 101.2 fL — ABNORMAL HIGH (ref 80.0–100.0)
Monocytes Absolute: 0.7 10*3/uL (ref 0.1–1.0)
Monocytes Relative: 14 %
Neutro Abs: 1.9 10*3/uL (ref 1.7–7.7)
Neutrophils Relative %: 39 %
Platelets: 210 10*3/uL (ref 150–400)
RBC: 4.13 MIL/uL (ref 3.87–5.11)
RDW: 14.6 % (ref 11.5–15.5)
WBC: 4.8 10*3/uL (ref 4.0–10.5)
nRBC: 0 % (ref 0.0–0.2)

## 2021-07-21 LAB — APTT: aPTT: 24 seconds (ref 24–36)

## 2021-07-21 LAB — PHOSPHORUS: Phosphorus: 3.1 mg/dL (ref 2.5–4.6)

## 2021-07-21 LAB — MAGNESIUM: Magnesium: 1.7 mg/dL (ref 1.7–2.4)

## 2021-07-21 MED ORDER — TAPENTADOL HCL ER 100 MG PO TB12: 100.0000 mg | ORAL_TABLET | Freq: Two times a day (BID) | ORAL | 0 refills | Status: AC

## 2021-07-21 MED ORDER — PREDNISONE 1 MG PO TABS: 9.0000 mg | ORAL_TABLET | Freq: Every day | ORAL | 1 refills | Status: DC

## 2021-07-21 MED ORDER — POTASSIUM CHLORIDE CRYS ER 20 MEQ PO TBCR
40.0000 meq | EXTENDED_RELEASE_TABLET | Freq: Once | ORAL | Status: AC
  Administered 2021-07-21: 40 meq via ORAL
  Filled 2021-07-21: qty 2

## 2021-07-21 MED ORDER — MAGNESIUM SULFATE 2 GM/50ML IV SOLN
2.0000 g | Freq: Once | INTRAVENOUS | Status: AC
  Administered 2021-07-21: 2 g via INTRAVENOUS
  Filled 2021-07-21: qty 50

## 2021-07-21 MED ORDER — OXYCODONE HCL 5 MG PO TABS: 5.0000 mg | ORAL_TABLET | ORAL | 0 refills | Status: DC | PRN

## 2021-07-21 MED ORDER — HYDROCODONE-ACETAMINOPHEN 5-325 MG PO TABS
1.0000 | ORAL_TABLET | Freq: Three times a day (TID) | ORAL | Status: DC | PRN
  Administered 2021-07-21: 1 via ORAL
  Filled 2021-07-21 (×2): qty 1

## 2021-07-21 MED ORDER — METOPROLOL SUCCINATE ER 25 MG PO TB24: 25.0000 mg | ORAL_TABLET | Freq: Every day | ORAL | 2 refills | Status: DC

## 2021-07-21 MED ORDER — TORSEMIDE 40 MG PO TABS: 40.0000 mg | ORAL_TABLET | Freq: Two times a day (BID) | ORAL | 2 refills | Status: DC

## 2021-07-21 MED ORDER — DICLOFENAC SODIUM 75 MG PO TBEC: 75.0000 mg | DELAYED_RELEASE_TABLET | Freq: Every day | ORAL | 2 refills | Status: DC

## 2021-07-21 NOTE — Care Management Obs Status (Signed)
[place] NOTIFICATION   Patient Details  Name: Nicole [name] MRN: [id number] Date of Birth: 1946/06/03   Medicare Observation Status Notification Given:  Yes    Ihor Gully, LCSW 07/21/2021, 10:56 AM

## 2021-07-21 NOTE — Discharge Summary (Addendum)
Alexya Mcdaris, is a 75 y.o. female  DOB 06-15-1946  MRN [id number].  Admission date:  07/20/2021  Admitting Physician  Bernadette Hoit, DO  Discharge Date:  07/21/2021   Primary MD  Jake Samples, PA-C  Recommendations for primary care physician for things to follow:   1)Repeat CBC and BMP in 1 week   Admission Diagnosis  Aphasia [U38.45] Acute metabolic encephalopathy [X64.68]   Discharge Diagnosis  Aphasia [E32.12] Acute metabolic encephalopathy [Y48.25]    Principal Problem:   Acute metabolic encephalopathy Active Problems:   GERD (gastroesophageal reflux disease)   HTN (hypertension)   Chronic pain syndrome   Hypokalemia   Elevated MCV   Mixed hyperlipidemia      Past Medical History:  Diagnosis Date   AC (acromioclavicular) joint bone spurs    lt shoulder   Anemia    Arthritis    Carpal tunnel syndrome, bilateral    CHF (congestive heart failure) (HCC)    Collagen vascular disease ([place])    Gastroesophageal reflux    Headache    recent visit to ER @ [facility] for severe headache   Hypertension    Lumbar stenosis    Hx of ESIs by Dr. Nelva Bush   Polyarthralgia    Polymyalgia [facility])    Shingles    Spinal stenosis     Past Surgical History:  Procedure Laterality Date   BACK SURGERY  07/05/2018, 07/2018   x2    BIOPSY  09/22/2020   Procedure: BIOPSY;  Surgeon: Rogene Houston, MD;  [facility];  Service: Endoscopy;;   CHOLECYSTECTOMY     COLONOSCOPY  06/11/2012   Procedure: COLONOSCOPY;  Surgeon: Jamesetta So, MD;  [facility];  Service: Gastroenterology;  Laterality: N/A;   COLONOSCOPY WITH PROPOFOL N/A 09/22/2020   Procedure: COLONOSCOPY WITH PROPOFOL;  Surgeon: Rogene Houston, MD;  [facility];  Service: Endoscopy;  Laterality: N/A;  730   HYSTEROSCOPY WITH D & C N/A 02/25/2014   Procedure: DILATATION AND CURETTAGE /HYSTEROSCOPY;   Surgeon: Florian Buff, MD;  Location: [facility];  Service: Gynecology;  Laterality: N/A;   POLYPECTOMY N/A 02/25/2014   Procedure: POLYPECTOMY;  Surgeon: Florian Buff, MD;  Location: [facility];  Service: Gynecology;  Laterality: N/A;   RESECTION DISTAL CLAVICAL Right 03/26/2015   Procedure: OPEN DISTAL CLAVICAL RESECTION ;  Surgeon: Netta Cedars, MD;  Location: [place];  Service: Orthopedics;  Laterality: Right;       HPI  from the history and physical done on the day of admission:     Chief Complaint: Altered mental status   HPI: Payton Moder is a 75 y.o. female with medical history significant for chronic pain syndrome, hypertension, hyperlipidemia, GERD who presents to the emergency department due to altered mental status.  Patient was last well-known at 8 PM on Monday (9/26).  Patient was seen by son around 2 PM yesterday (9/27) and she was more confused, lethargic and slow to respond, BP was checked and  was noted to be elevated.  It was uncertain if this was related to patient incorrectly taking meds.  At baseline, patient is wheelchair-bound.  There was no report of fever, chills, nausea, vomiting, chest pain or shortness of breath.    ED Course:  In the emergency department, BP was 154/119, other vital signs are within normal range.  Work-up in the ED showed normal CBC except for elevated MCV at 102.9 and BMP except for mild hypokalemia, urinalysis was unimpressive for UTI, urine drug screen was negative, ammonia was 13.  Influenza A, B, SARS coronavirus 2 was negative. Chest x-ray showed no evidence of acute cardiopulmonary disease  CT of head without contrast showed normal head CT Tele-neurology was consulted and suspected metabolic encephalopathy, but also indicated further stroke work-up.  Hospitalist was asked to admit patient for further evaluation and management     Hospital Course:    Acute metabolic encephalopathy possibly secondary to polypharmacy-- --speech appears back to  baseline, mentation improving- -No evidence of acute stroke, no evidence of acute infection - opiate medications adjusted--patient may need further medication adjustments  CT of head without contrast showed normal head CT Patient has chronic pain syndrome and is on several pain and immunosuppressant drugs-  -Echocardiogram with EF of 65 to 70%, with moderate LVH but no regional wall motion abnormalities MRI of brain without contrast --does not show any acute strokes PT/OT eval -appreciated recommended SNF rehab Neurologist will be consulted and we shall await further recommendation -9/28/222- EtOH negative -07/20/21-- urine tox screen negative -Acute stroke ruled out, A1c is 5.5, LDL is 62 with HDL of 106 -UA is not suggestive of UTI Ammonia--WNL  No Leukocytosis, Creatinine 0.76   Hypokalemia - Normalized with replacement  Elevated MCV (102.9) Vitamin E42 and folic acid are not low  Essential hypertension -MRI negative for acute stroke --Toprol-XL as ordered  Mixed hyperlipidemia -LDL is 62 with HDL of 106 Continue statin    GERD Continue PPI  Chronic pain syndrome/chronic RA ---Altered mentation/acute toxic metabolic encephalopathy was probably secondary to polypharmacy including narcotics -Opiate medications adjusted -Continue PTA rheumatoid arthritis medications -Pain clinic follow-up for management of pain medication strongly advised -Discussed case with pharmacy patient on Prednisone 9 mg daily  Morbid Obesity--- A1c is 5.5 -Low calorie diet, portion control and increase physical activity discussed with patient -Body mass index is 49.61 kg/m.  Social/Ethics--- I called and spoke with patient's son Hanny Elsberry,  . His number is [phone]--- questions answered - Generalized weakness/deconditioning and ambulatory dysfunction--- will benefit from SNF rehab -up to 1 month ago was able to transfer from bed to wheelchair and perform most ADLs independently while in  the wheelchair -Increasing weakness apparently according to son over the last few weeks   Disposition: The patient is from: Home              Anticipated d/c is to: SNF            Procedures/Significant Events:  9/28 MRI brain W0 contrast: No evidence of acute intracranial abnormality on this motion limited exam. 2. Moderate chronic microvascular ischemic disease and mild atrophy.    Discharge Condition: stable  Follow UP   Contact information for after-discharge care     [facility] .   Service: Skilled Chiropodist information: [address] [place] [phone]  Diet and Activity recommendation:  As advised  Discharge Instructions    Discharge Instructions     Call MD for:  difficulty breathing, headache or visual disturbances   Complete by: As directed    Call MD for:  persistant dizziness or light-headedness   Complete by: As directed    Call MD for:  persistant nausea and vomiting   Complete by: As directed    Call MD for:  temperature >100.4   Complete by: As directed    Diet - low sodium heart healthy   Complete by: As directed    Discharge instructions   Complete by: As directed    1)Repeat CBC and BMP in 1 week   Increase activity slowly   Complete by: As directed        Discharge Medications     Allergies as of 07/21/2021       Reactions   Oxycontin [oxycodone Hcl] Swelling   Pt tolerates hydromorphone.   Sulfa Antibiotics Other (See Comments)   Sores   Tramadol    "Makes me feel like I am falling"   Penicillins Rash   Sulfasalazine Other (See Comments)   Sores        Medication List     STOP taking these medications    TRAMADOL HCL PO       TAKE these medications    acetaminophen 325 MG tablet Commonly known as: TYLENOL Take 2 tablets (650 mg total) by mouth every 6 (six) hours as needed for mild pain or headache  (or Fever >/= 101).   atorvastatin 20 MG tablet Commonly known as: LIPITOR Take 20 mg by mouth daily.   calcitRIOL 0.25 MCG capsule Commonly known as: ROCALTROL Take 0.25 mcg by mouth See admin instructions. Take 0.25 every Mon, Wed, Fri.   cyclobenzaprine 5 MG tablet Commonly known as: FLEXERIL Take 5 mg by mouth every 12 (twelve) hours as needed for muscle spasms.   diclofenac 75 MG EC tablet Commonly known as: VOLTAREN Take 1 tablet (75 mg total) by mouth daily with breakfast. What changed: when to take this   diclofenac Sodium 1 % Gel Commonly known as: VOLTAREN Apply 2 g topically 4 (four) times daily as needed (pain).   gabapentin 400 MG capsule Commonly known as: NEURONTIN Take 400 mg by mouth 3 (three) times daily.   GAS-X PO Take 1 tablet by mouth 3 (three) times daily as needed (flatulence).   Kevzara 200 MG/1.14ML Soaj Generic drug: Sarilumab Inject 200 mg into the skin every 14 (fourteen) days.   leflunomide 10 MG tablet Commonly known as: ARAVA TAKE (1) TABLET BY MOUTH DAILY. What changed: See the new instructions.   lidocaine 5 % Commonly known as: LIDODERM Place 1-2 patches onto the skin every 12 (twelve) hours as needed (pain).   metolazone 5 MG tablet Commonly known as: ZAROXOLYN Take 5 mg by mouth 2 (two) times a week.   metoprolol succinate 25 MG 24 hr tablet Commonly known as: Toprol XL Take 1 tablet (25 mg total) by mouth daily.   MULTIVITAMIN PO Take 1 tablet by mouth daily.   oxyCODONE 5 MG immediate release tablet Commonly known as: Roxicodone Take 1 tablet (5 mg total) by mouth every 4 (four) hours as needed for breakthrough pain.   pantoprazole 40 MG tablet Commonly known as: PROTONIX Take 40 mg by mouth 2 (two) times daily.   potassium chloride 10 MEQ tablet Commonly known as: KLOR-CON Take 10 mEq by mouth daily.  predniSONE 1 MG tablet Commonly known as: DELTASONE Take 9 tablets (9 mg total) by mouth daily with  breakfast. Start taking on: July 22, 2021 What changed:  how much to take how to take this when to take this additional instructions   tapentadol 100 MG 12 hr tablet Commonly known as: NUCYNTA Take 1 tablet (100 mg total) by mouth every 12 (twelve) hours for 5 days. What changed:  medication strength how much to take Another medication with the same name was removed. Continue taking this medication, and follow the directions you see here.   Torsemide 40 MG Tabs Take 40 mg by mouth 2 (two) times daily. What changed:  medication strength how much to take       Major procedures and Radiology Reports - PLEASE review detailed and final reports for all details, in brief -   MR BRAIN WO CONTRAST  Result Date: 07/20/2021 CLINICAL DATA:  Neuro deficit, acute, stroke suspected EXAM: MRI HEAD WITHOUT CONTRAST TECHNIQUE: Multiplanar, multiecho pulse sequences of the brain and surrounding structures were obtained without intravenous contrast. COMPARISON:  None. FINDINGS: Moderately motion limited exam.  Within this limitation: Brain: No evidence of acute infarction, acute hemorrhage, hydrocephalus, extra-axial collection or mass lesion. Moderate patchy confluent T2 hyperintensity within the supratentorial and pontine white matter, nonspecific but compatible with chronic microvascular ischemic disease. Mild atrophy. Vascular: Major arterial flow voids are maintained at the skull base. Skull and upper cervical spine: Poorly evaluated due to motion without obvious focal marrow replacing lesion. Sinuses/Orbits: Mild paranasal sinus mucosal thickening. No acute orbital findings. Other: Small bilateral mastoid effusions. IMPRESSION: 1. No evidence of acute intracranial abnormality on this motion limited exam. 2. Moderate chronic microvascular ischemic disease and mild atrophy. Electronically Signed   By: Margaretha Sheffield M.D.   On: 07/20/2021 11:50   DG Chest Portable 1 View  Result Date:  07/20/2021 CLINICAL DATA:  Weakness, slurred speech EXAM: PORTABLE CHEST 1 VIEW COMPARISON:  03/31/2021 FINDINGS: Lingular scarring/atelectasis. Right lung is clear. No pleural effusion or pneumothorax. Mild cardiomegaly. Degenerative changes of the bilateral shoulders. IMPRESSION: No evidence of acute cardiopulmonary disease. Mild cardiomegaly. Electronically Signed   By: Julian Hy M.D.   On: 07/20/2021 02:01   CT HEAD CODE STROKE WO CONTRAST  Result Date: 07/20/2021 CLINICAL DATA:  Code stroke.  Speech changes EXAM: CT HEAD WITHOUT CONTRAST TECHNIQUE: Contiguous axial images were obtained from the base of the skull through the vertex without intravenous contrast. COMPARISON:  None. FINDINGS: Brain: There is no mass, hemorrhage or extra-axial collection. The size and configuration of the ventricles and extra-axial CSF spaces are normal. The brain parenchyma is normal, without evidence of acute or chronic infarction. Vascular: No abnormal hyperdensity of the major intracranial arteries or dural venous sinuses. No intracranial atherosclerosis. Skull: The visualized skull base, calvarium and extracranial soft tissues are normal. Sinuses/Orbits: No fluid levels or advanced mucosal thickening of the visualized paranasal sinuses. No mastoid or middle ear effusion. The orbits are normal. ASPECTS [facility] Stroke Program Early CT Score) - Ganglionic level infarction (caudate, lentiform nuclei, internal capsule, insula, M1-M3 cortex): 7 - Supraganglionic infarction (M4-M6 cortex): 3 Total score (0-10 with 10 being normal): 10 IMPRESSION: 1. Normal head CT. 2. ASPECTS is 10. These results were called by telephone at the time of interpretation on 07/20/2021 at 1:06 am to provider [place] , who verbally acknowledged these results. Electronically Signed   By: Ulyses Jarred M.D.   On: 07/20/2021 01:06   ECHOCARDIOGRAM LIMITED  Result Date: 07/20/2021    ECHOCARDIOGRAM LIMITED REPORT   Patient Name:   ZNIYA COTTONE Date of Exam: 07/20/2021 Medical Rec #:  [id number]    Height:       61.0 in Accession #:    [id number]   Weight:       262.6 lb Date of Birth:  [birth year]     BSA:          2.120 m Patient Age:    78 years     BP:           174/91 mmHg Patient Gender: F            HR:           94 bpm. Exam Location:  [facility] Procedure: Limited Echo Indications:    Stroke  History:        Patient has prior history of Echocardiogram examinations, most                 recent 04/14/2021. CHF; Risk Factors:Hypertension and                 Dyslipidemia. Morbid Obesity.  Sonographer:    Wenda Low Referring Phys: [id number] OLADAPO ADEFESO IMPRESSIONS  1. Limited study, no Doppler performed.  2. Left ventricular ejection fraction, by estimation, is 65 to 70%. The left ventricle has normal function. The left ventricle has no regional wall motion abnormalities. There is moderate left ventricular hypertrophy.  3. Right ventricular systolic function is normal. The right ventricular size is normal.  4. There is a trivial pericardial effusion anterior to the right ventricle.  5. The mitral valve is grossly normal.  6. The aortic valve is tricuspid. Aortic valve regurgitation is not visualized. Mild to moderate aortic valve sclerosis/calcification is present, without any evidence of aortic stenosis.  7. Aortic dilatation noted. There is mild dilatation of the ascending aorta, measuring 41 mm.  8. The inferior vena cava is normal in size with greater than 50% respiratory variability, suggesting right atrial pressure of 3 mmHg. Comparison(s): Prior images reviewed side by side. LVEF remains vigorous. FINDINGS  Left Ventricle: Left ventricular ejection fraction, by estimation, is 65 to 70%. The left ventricle has normal function. The left ventricle has no regional wall motion abnormalities. The left ventricular internal cavity size was normal in size. There is  moderate left ventricular hypertrophy. Right Ventricle: The right ventricular  size is normal. No increase in right ventricular wall thickness. Right ventricular systolic function is normal. Pericardium: Trivial pericardial effusion is present. The pericardial effusion is anterior to the right ventricle. Presence of pericardial fat pad. Mitral Valve: The mitral valve is grossly normal. Mild mitral annular calcification. Aortic Valve: The aortic valve is tricuspid. There is mild aortic valve annular calcification. Aortic valve regurgitation is not visualized. Mild to moderate aortic valve sclerosis/calcification is present, without any evidence of aortic stenosis. Pulmonic Valve: The pulmonic valve was grossly normal. Aorta: Aortic dilatation noted. There is mild dilatation of the ascending aorta, measuring 41 mm. Venous: The inferior vena cava is normal in size with greater than 50% respiratory variability, suggesting right atrial pressure of 3 mmHg. LEFT VENTRICLE PLAX 2D LVIDd:         4.00 cm LVIDs:         2.60 cm LV PW:         1.30 cm LV IVS:        1.50 cm LVOT diam:     2.00 cm LVOT  Area:     3.14 cm  LEFT ATRIUM         Index LA diam:    3.90 cm 1.84 cm/m   AORTA Ao Asc diam: 4.10 cm  SHUNTS Systemic Diam: 2.00 cm Rozann Lesches MD Electronically signed by Rozann Lesches MD Signature Date/Time: 07/20/2021/11:20:06 AM    Final     Micro Results   Recent Results (from the past 240 hour(s))  Resp Panel by RT-PCR (Flu A&B, Covid) Nasopharyngeal Swab     Status: None   Collection Time: 07/20/21 12:46 AM   Specimen: Nasopharyngeal Swab; Nasopharyngeal(NP) swabs in vial transport medium  Result Value Ref Range Status   SARS Coronavirus 2 by RT PCR NEGATIVE NEGATIVE Final    Comment: (NOTE) SARS-CoV-2 target nucleic acids are NOT DETECTED.  The SARS-CoV-2 RNA is generally detectable in upper respiratory specimens during the acute phase of infection. The lowest concentration of SARS-CoV-2 viral copies this assay can detect is 138 copies/mL. A negative result does not  preclude SARS-Cov-2 infection and should not be used as the sole basis for treatment or other patient management decisions. A negative result may occur with  improper specimen collection/handling, submission of specimen other than nasopharyngeal swab, presence of viral mutation(s) within the areas targeted by this assay, and inadequate number of viral copies(<138 copies/mL). A negative result must be combined with clinical observations, patient history, and epidemiological information. The expected result is Negative.  Fact Sheet for Patients:  EntrepreneurPulse.com.au  Fact Sheet for Healthcare Providers:  IncredibleEmployment.be  This test is no t yet approved or cleared by the [place] FDA and  has been authorized for detection and/or diagnosis of SARS-CoV-2 by FDA under an Emergency Use Authorization (EUA). This EUA will remain  in effect (meaning this test can be used) for the duration of the COVID-19 declaration under Section 564(b)(1) of the Act, 21 U.S.C.section 360bbb-3(b)(1), unless the authorization is terminated  or revoked sooner.       Influenza A by PCR NEGATIVE NEGATIVE Final   Influenza B by PCR NEGATIVE NEGATIVE Final    Comment: (NOTE) The Xpert Xpress SARS-CoV-2/FLU/RSV plus assay is intended as an aid in the diagnosis of influenza from Nasopharyngeal swab specimens and should not be used as a sole basis for treatment. Nasal washings and aspirates are unacceptable for Xpert Xpress SARS-CoV-2/FLU/RSV testing.  Fact Sheet for Patients: EntrepreneurPulse.com.au  Fact Sheet for Healthcare Providers: IncredibleEmployment.be  This test is not yet approved or cleared by the [place] FDA and has been authorized for detection and/or diagnosis of SARS-CoV-2 by FDA under an Emergency Use Authorization (EUA). This EUA will remain in effect (meaning this test can be used) for the  duration of the COVID-19 declaration under Section 564(b)(1) of the Act, 21 U.S.C. section 360bbb-3(b)(1), unless the authorization is terminated or revoked.  Performed at [facility], [address], [place], [place] [address]     Today   Subjective    Alaya Iverson today has no new complaints  No fever  Or chills   No Nausea, Vomiting or Diarrhea        Patient has been seen and examined prior to discharge   Objective   Blood pressure 131/84, pulse 86, temperature 98.1 F (36.7 C), resp. rate 19, height 5\' 1"  (1.549 m), weight 119.1 kg, SpO2 98 %.   Intake/Output Summary (Last 24 hours) at 07/21/2021 1310 Last data filed at 07/21/2021 0900 Gross per 24 hour  Intake 460 ml  Output 1000  ml  Net -540 ml   Exam Gen:- Awake Alert, no acute distress, morbidly obese, in no acute distress HEENT:- [place].AT, No sclera icterus Neck-Supple Neck,No JVD,.  Lungs-  CTAB , good air movement bilaterally  CV- S1, S2 normal, regular Abd-  +ve B.Sounds, Abd Soft, No tenderness, increased truncal adiposity extremity/Skin:- No  edema,   good pulses Psych-affect is appropriate, oriented x3 Neuro-generalized weakness, no new focal deficits, no tremors    Data Review   CBC w Diff:  Lab Results  Component Value Date   WBC 4.8 07/21/2021   HGB 13.5 07/21/2021   HGB 12.7 10/30/2019   HCT 41.8 07/21/2021   HCT 38.2 10/30/2019   PLT 210 07/21/2021   PLT 359 10/30/2019   LYMPHOPCT 42 07/21/2021   MONOPCT 14 07/21/2021   EOSPCT 4 07/21/2021   BASOPCT 1 07/21/2021    CMP:  Lab Results  Component Value Date   NA 139 07/21/2021   NA 139 10/30/2019   K 3.7 07/21/2021   CL 106 07/21/2021   CO2 24 07/21/2021   BUN 9 07/21/2021   BUN 14 10/30/2019   CREATININE 0.76 07/21/2021   CREATININE 1.60 (H) 05/09/2021   PROT 5.6 (L) 07/21/2021   PROT 7.1 10/30/2019   ALBUMIN 3.4 (L) 07/21/2021   ALBUMIN 4.1 10/30/2019   BILITOT 0.8 07/21/2021   BILITOT 0.3 10/30/2019   ALKPHOS 53  07/21/2021   AST 20 07/21/2021   ALT 24 07/21/2021    Total Discharge time is about 33 minutes  Roxan Hockey M.D on 07/21/2021 at 1:10 PM  Go to [email or web address] -  for contact info  [facility] - Office  [phone]

## 2021-07-21 NOTE — Progress Notes (Signed)
Report given to Arboriculturist at [facility]. Patient waiting for [facility] for transport.

## 2021-07-21 NOTE — Discharge Instructions (Signed)
1)Repeat CBC and BMP in 1 week

## 2021-07-21 NOTE — TOC Transition Note (Signed)
Transition of Care [facility]) - CM/SW Discharge Note   Patient Details  Name: Dashanique Brownstein MRN: [id number] Date of Birth: 15-Jan-1946  [facility] CM/SW Contact:  Ihor Gully, LCSW Phone Number: 07/21/2021, 3:32 PM   Clinical Narrative:    Discharge clinicals sent to facility. TOC signing off.    Final next level of care: Skilled Nursing Facility Barriers to Discharge: No Barriers Identified   Patient Goals and CMS Choice Patient states their goals for this hospitalization and ongoing recovery are:: rehab then go to ALF   Choice offered to / list presented to : Adult Children  Discharge Placement              Patient chooses bed at: Other - please specify in the comment section below: ([facility]) Patient to be transferred to facility by: [facility] Name of family member notified: Son and daughter Patient and family notified of of transfer: 07/21/21  Discharge Plan and Services     Post Acute Care Choice: [facility]                               Social Determinants of Health (SDOH) Interventions     Readmission Risk Interventions No flowsheet data found.

## 2021-07-23 DIAGNOSIS — K219 Gastro-esophageal reflux disease without esophagitis: Secondary | ICD-10-CM | POA: Diagnosis present

## 2021-07-23 DIAGNOSIS — E785 Hyperlipidemia, unspecified: Secondary | ICD-10-CM | POA: Diagnosis present

## 2021-07-23 DIAGNOSIS — M79662 Pain in left lower leg: Secondary | ICD-10-CM | POA: Diagnosis not present

## 2021-07-23 DIAGNOSIS — G8929 Other chronic pain: Secondary | ICD-10-CM | POA: Diagnosis not present

## 2021-07-23 DIAGNOSIS — E876 Hypokalemia: Secondary | ICD-10-CM | POA: Diagnosis present

## 2021-07-23 DIAGNOSIS — G9340 Encephalopathy, unspecified: Secondary | ICD-10-CM | POA: Diagnosis present

## 2021-07-23 DIAGNOSIS — G9341 Metabolic encephalopathy: Secondary | ICD-10-CM | POA: Diagnosis present

## 2021-07-23 DIAGNOSIS — I509 Heart failure, unspecified: Secondary | ICD-10-CM | POA: Diagnosis present

## 2021-07-23 DIAGNOSIS — M6281 Muscle weakness (generalized): Secondary | ICD-10-CM | POA: Diagnosis present

## 2021-07-23 DIAGNOSIS — G894 Chronic pain syndrome: Secondary | ICD-10-CM | POA: Diagnosis present

## 2021-07-23 DIAGNOSIS — M1380 Other specified arthritis, unspecified site: Secondary | ICD-10-CM | POA: Diagnosis present

## 2021-07-23 DIAGNOSIS — R52 Pain, unspecified: Secondary | ICD-10-CM | POA: Diagnosis not present

## 2021-07-23 DIAGNOSIS — R5381 Other malaise: Secondary | ICD-10-CM | POA: Diagnosis not present

## 2021-07-23 DIAGNOSIS — I10 Essential (primary) hypertension: Secondary | ICD-10-CM | POA: Diagnosis present

## 2021-07-23 DIAGNOSIS — M353 Polymyalgia rheumatica: Secondary | ICD-10-CM | POA: Diagnosis present

## 2021-07-23 DIAGNOSIS — F4321 Adjustment disorder with depressed mood: Secondary | ICD-10-CM | POA: Diagnosis not present

## 2021-07-23 DIAGNOSIS — Z79899 Other long term (current) drug therapy: Secondary | ICD-10-CM | POA: Diagnosis not present

## 2021-07-25 DIAGNOSIS — Z79899 Other long term (current) drug therapy: Secondary | ICD-10-CM | POA: Diagnosis not present

## 2021-07-25 DIAGNOSIS — I509 Heart failure, unspecified: Secondary | ICD-10-CM | POA: Diagnosis not present

## 2021-07-25 DIAGNOSIS — E785 Hyperlipidemia, unspecified: Secondary | ICD-10-CM | POA: Diagnosis not present

## 2021-07-25 DIAGNOSIS — I10 Essential (primary) hypertension: Secondary | ICD-10-CM | POA: Diagnosis not present

## 2021-07-25 DIAGNOSIS — K219 Gastro-esophageal reflux disease without esophagitis: Secondary | ICD-10-CM | POA: Diagnosis not present

## 2021-07-25 DIAGNOSIS — G894 Chronic pain syndrome: Secondary | ICD-10-CM | POA: Diagnosis not present

## 2021-07-28 DIAGNOSIS — R5381 Other malaise: Secondary | ICD-10-CM | POA: Diagnosis not present

## 2021-07-28 DIAGNOSIS — G9341 Metabolic encephalopathy: Secondary | ICD-10-CM | POA: Diagnosis not present

## 2021-08-01 DIAGNOSIS — G894 Chronic pain syndrome: Secondary | ICD-10-CM | POA: Diagnosis not present

## 2021-08-01 DIAGNOSIS — R5381 Other malaise: Secondary | ICD-10-CM | POA: Diagnosis not present

## 2021-08-01 DIAGNOSIS — Z79899 Other long term (current) drug therapy: Secondary | ICD-10-CM | POA: Diagnosis not present

## 2021-08-01 DIAGNOSIS — E785 Hyperlipidemia, unspecified: Secondary | ICD-10-CM | POA: Diagnosis not present

## 2021-08-01 DIAGNOSIS — G9341 Metabolic encephalopathy: Secondary | ICD-10-CM | POA: Diagnosis not present

## 2021-08-01 DIAGNOSIS — K219 Gastro-esophageal reflux disease without esophagitis: Secondary | ICD-10-CM | POA: Diagnosis not present

## 2021-08-01 DIAGNOSIS — I10 Essential (primary) hypertension: Secondary | ICD-10-CM | POA: Diagnosis not present

## 2021-08-04 DIAGNOSIS — R52 Pain, unspecified: Secondary | ICD-10-CM | POA: Diagnosis not present

## 2021-08-04 DIAGNOSIS — I10 Essential (primary) hypertension: Secondary | ICD-10-CM | POA: Diagnosis not present

## 2021-08-04 DIAGNOSIS — Z79899 Other long term (current) drug therapy: Secondary | ICD-10-CM | POA: Diagnosis not present

## 2021-08-04 DIAGNOSIS — I509 Heart failure, unspecified: Secondary | ICD-10-CM | POA: Diagnosis not present

## 2021-08-04 DIAGNOSIS — R5381 Other malaise: Secondary | ICD-10-CM | POA: Diagnosis not present

## 2021-08-04 DIAGNOSIS — K219 Gastro-esophageal reflux disease without esophagitis: Secondary | ICD-10-CM | POA: Diagnosis not present

## 2021-08-04 DIAGNOSIS — G894 Chronic pain syndrome: Secondary | ICD-10-CM | POA: Diagnosis not present

## 2021-08-04 NOTE — Progress Notes (Deleted)
Office Visit Note  Patient: Nicole [name]             Date of Birth: 25-Sep-1946           MRN: [id number]             PCP: Jake Samples, PA-C Referring: Jake Samples, [place]* Visit Date: 08/18/2021 Occupation: @GUAROCC @  Subjective:    History of Present Illness: Nicole [name] is a 75 y.o. female with history of seropositive rheumatoid arthritis, PMR, and temporal arteritis. She is on kevzara 200 mg sq injections every 2 weeks, prednisone 5 mg  2 tablets daily, and Arava 10 mg 1 tablet daily.    She had a recent hospitalization 9/28-9/29 for management of altered mental status/acute toxic metabolic encephalopathy secondary to polypharmacy including narcotics. She was transferred to SNF for 1 month.   CBC and CMP updated on 07/21/21.  TB gold negative on 09/09/20.  Future order for TB gold placed today.   Activities of Daily Living:  Patient reports morning stiffness for *** {minute/hour:19697}.   Patient {ACTIONS;DENIES/REPORTS:21021675::"Denies"} nocturnal pain.  Difficulty dressing/grooming: {ACTIONS;DENIES/REPORTS:21021675::"Denies"} Difficulty climbing stairs: {ACTIONS;DENIES/REPORTS:21021675::"Denies"} Difficulty getting out of chair: {ACTIONS;DENIES/REPORTS:21021675::"Denies"} Difficulty using hands for taps, buttons, cutlery, and/or writing: {ACTIONS;DENIES/REPORTS:21021675::"Denies"}  No Rheumatology ROS completed.   PMFS History:  Patient Active Problem List   Diagnosis Date Noted   Acute metabolic encephalopathy 93/81/0175   Hypokalemia 07/20/2021   Elevated MCV 07/20/2021   Mixed hyperlipidemia 07/20/2021   Diverticulitis 05/02/2018   Diverticulitis of colon 05/01/2018   Morbid obesity with BMI of 50.0-59.9, adult ([place]) 05/01/2018   Chronic diastolic HF (heart failure) ([place]) 05/01/2018   On prednisone therapy 05/31/2017   Positive anti-CCP test 05/31/2017   Rheumatoid factor positive 05/31/2017   Memory loss 04/04/2017   Polymyalgia rheumatica ([place])  04/04/2017   High risk medication use 12/07/2016   DDD (degenerative disc disease), lumbar 09/27/2016   HTN (hypertension) 06/05/2016   Chronic pain syndrome 06/05/2016   Acute diverticulitis 06/02/2016   Diverticulitis large intestine 06/02/2016   Spinal stenosis of lumbosacral region 02/29/2016   Joint pain 02/29/2016   Endometrial polyp 02/06/2014   Postmenopausal bleeding 01/30/2014   GERD (gastroesophageal reflux disease) 09/29/2013   Spinal stenosis, thoracic 09/29/2013   Shingles 09/29/2013   Thoracic or lumbosacral neuritis or radiculitis, unspecified 05/04/2011   Abnormality of gait 05/04/2011   Muscle weakness (generalized) 05/04/2011   Bilateral primary osteoarthritis of knee 04/07/2009   KNEE PAIN 04/07/2009    Past Medical History:  Diagnosis Date   AC (acromioclavicular) joint bone spurs    lt shoulder   Anemia    Arthritis    Carpal tunnel syndrome, bilateral    CHF (congestive heart failure) (HCC)    Collagen vascular disease ([place])    Gastroesophageal reflux    Headache    recent visit to ER @ [facility] for severe headache   Hypertension    Lumbar stenosis    Hx of ESIs by Dr. Nelva Bush   Polyarthralgia    Polymyalgia [facility])    Shingles    Spinal stenosis     Family History  Problem Relation Age of Onset   Hypertension Mother    Pneumonia Father    Arthritis Sister    Diabetes Paternal Grandmother    Past Surgical History:  Procedure Laterality Date   BACK SURGERY  07/05/2018, 07/2018   x2    BIOPSY  09/22/2020   Procedure: BIOPSY;  Surgeon: Rogene Houston, MD;  [facility];  Service: Endoscopy;;   CHOLECYSTECTOMY     COLONOSCOPY  06/11/2012   Procedure: COLONOSCOPY;  Surgeon: Jamesetta So, MD;  Location[facility];  Service: Gastroenterology;  Laterality: N/A;   COLONOSCOPY WITH PROPOFOL N/A 09/22/2020   Procedure: COLONOSCOPY WITH PROPOFOL;  Surgeon: Rogene Houston, MD;  Location[facility];  Service: Endoscopy;  Laterality:  N/A;  730   HYSTEROSCOPY WITH D & C N/A 02/25/2014   Procedure: DILATATION AND CURETTAGE /HYSTEROSCOPY;  Surgeon: Florian Buff, MD;  Location: [facility];  Service: Gynecology;  Laterality: N/A;   POLYPECTOMY N/A 02/25/2014   Procedure: POLYPECTOMY;  Surgeon: Florian Buff, MD;  Location: [facility];  Service: Gynecology;  Laterality: N/A;   RESECTION DISTAL CLAVICAL Right 03/26/2015   Procedure: OPEN DISTAL CLAVICAL RESECTION ;  Surgeon: Netta Cedars, MD;  Location: [facility];  Service: Orthopedics;  Laterality: Right;   Social History   Social History Narrative   Lives at home alone.   Right-handed.   1-2 cups caffeine daily.   Immunization History  Administered Date(s) Administered   Influenza,inj,quad, With Preservative 07/23/2017   Unspecified SARS-COV-2 Vaccination 06/02/2020, 06/23/2020     Objective: Vital Signs: There were no vitals taken for this visit.   Physical Exam Vitals and nursing note reviewed.  Constitutional:      Appearance: She is well-developed.  HENT:     Head: Normocephalic and atraumatic.  Eyes:     Conjunctiva/sclera: Conjunctivae normal.  Pulmonary:     Effort: Pulmonary effort is normal.  Abdominal:     Palpations: Abdomen is soft.  Musculoskeletal:     Cervical back: Normal range of motion.  Skin:    General: Skin is warm and dry.     Capillary Refill: Capillary refill takes less than 2 seconds.  Neurological:     Mental Status: She is alert and oriented to person, place, and time.  Psychiatric:        Behavior: Behavior normal.     Musculoskeletal Exam:   CDAI Exam: CDAI Score: -- Patient Global: --; Provider Global: -- Swollen: --; Tender: -- Joint Exam 08/18/2021   No joint exam has been documented for this visit   There is currently no information documented on the homunculus. Go to the Rheumatology activity and complete the homunculus joint exam.  Investigation: No additional findings.  Imaging: MR BRAIN WO CONTRAST  Result Date:  07/20/2021 CLINICAL DATA:  Neuro deficit, acute, stroke suspected EXAM: MRI HEAD WITHOUT CONTRAST TECHNIQUE: Multiplanar, multiecho pulse sequences of the brain and surrounding structures were obtained without intravenous contrast. COMPARISON:  None. FINDINGS: Moderately motion limited exam.  Within this limitation: Brain: No evidence of acute infarction, acute hemorrhage, hydrocephalus, extra-axial collection or mass lesion. Moderate patchy confluent T2 hyperintensity within the supratentorial and pontine white matter, nonspecific but compatible with chronic microvascular ischemic disease. Mild atrophy. Vascular: Major arterial flow voids are maintained at the skull base. Skull and upper cervical spine: Poorly evaluated due to motion without obvious focal marrow replacing lesion. Sinuses/Orbits: Mild paranasal sinus mucosal thickening. No acute orbital findings. Other: Small bilateral mastoid effusions. IMPRESSION: 1. No evidence of acute intracranial abnormality on this motion limited exam. 2. Moderate chronic microvascular ischemic disease and mild atrophy. Electronically Signed   By: Margaretha Sheffield M.D.   On: 07/20/2021 11:50   DG Chest Portable 1 View  Result Date: 07/20/2021 CLINICAL DATA:  Weakness, slurred speech EXAM: PORTABLE CHEST 1 VIEW COMPARISON:  03/31/2021 FINDINGS: Lingular scarring/atelectasis. Right  lung is clear. No pleural effusion or pneumothorax. Mild cardiomegaly. Degenerative changes of the bilateral shoulders. IMPRESSION: No evidence of acute cardiopulmonary disease. Mild cardiomegaly. Electronically Signed   By: Julian Hy M.D.   On: 07/20/2021 02:01   CT HEAD CODE STROKE WO CONTRAST  Result Date: 07/20/2021 CLINICAL DATA:  Code stroke.  Speech changes EXAM: CT HEAD WITHOUT CONTRAST TECHNIQUE: Contiguous axial images were obtained from the base of the skull through the vertex without intravenous contrast. COMPARISON:  None. FINDINGS: Brain: There is no mass, hemorrhage or  extra-axial collection. The size and configuration of the ventricles and extra-axial CSF spaces are normal. The brain parenchyma is normal, without evidence of acute or chronic infarction. Vascular: No abnormal hyperdensity of the major intracranial arteries or dural venous sinuses. No intracranial atherosclerosis. Skull: The visualized skull base, calvarium and extracranial soft tissues are normal. Sinuses/Orbits: No fluid levels or advanced mucosal thickening of the visualized paranasal sinuses. No mastoid or middle ear effusion. The orbits are normal. ASPECTS [facility] Stroke Program Early CT Score) - Ganglionic level infarction (caudate, lentiform nuclei, internal capsule, insula, M1-M3 cortex): 7 - Supraganglionic infarction (M4-M6 cortex): 3 Total score (0-10 with 10 being normal): 10 IMPRESSION: 1. Normal head CT. 2. ASPECTS is 10. These results were called by telephone at the time of interpretation on 07/20/2021 at 1:06 am to provider [facility] , who verbally acknowledged these results. Electronically Signed   By: Ulyses Jarred M.D.   On: 07/20/2021 01:06   ECHOCARDIOGRAM LIMITED  Result Date: 07/20/2021    ECHOCARDIOGRAM LIMITED REPORT   Patient Name:   FALYNN AILEY Date of Exam: 07/20/2021 Medical Rec #:  [id number]    Height:       61.0 in Accession #:    [id number]   Weight:       262.6 lb Date of Birth:  [birth year]     BSA:          2.120 m Patient Age:    60 years     BP:           174/91 mmHg Patient Gender: F            HR:           94 bpm. Exam Location:  [facility] Procedure: Limited Echo Indications:    Stroke  History:        Patient has prior history of Echocardiogram examinations, most                 recent 04/14/2021. CHF; Risk Factors:Hypertension and                 Dyslipidemia. Morbid Obesity.  Sonographer:    Wenda Low Referring Phys: [id number] OLADAPO ADEFESO IMPRESSIONS  1. Limited study, no Doppler performed.  2. Left ventricular ejection fraction, by estimation, is 65 to 70%.  The left ventricle has normal function. The left ventricle has no regional wall motion abnormalities. There is moderate left ventricular hypertrophy.  3. Right ventricular systolic function is normal. The right ventricular size is normal.  4. There is a trivial pericardial effusion anterior to the right ventricle.  5. The mitral valve is grossly normal.  6. The aortic valve is tricuspid. Aortic valve regurgitation is not visualized. Mild to moderate aortic valve sclerosis/calcification is present, without any evidence of aortic stenosis.  7. Aortic dilatation noted. There is mild dilatation of the ascending aorta, measuring 41 mm.  8. The inferior vena cava is normal in size  with greater than 50% respiratory variability, suggesting right atrial pressure of 3 mmHg. Comparison(s): Prior images reviewed side by side. LVEF remains vigorous. FINDINGS  Left Ventricle: Left ventricular ejection fraction, by estimation, is 65 to 70%. The left ventricle has normal function. The left ventricle has no regional wall motion abnormalities. The left ventricular internal cavity size was normal in size. There is  moderate left ventricular hypertrophy. Right Ventricle: The right ventricular size is normal. No increase in right ventricular wall thickness. Right ventricular systolic function is normal. Pericardium: Trivial pericardial effusion is present. The pericardial effusion is anterior to the right ventricle. Presence of pericardial fat pad. Mitral Valve: The mitral valve is grossly normal. Mild mitral annular calcification. Aortic Valve: The aortic valve is tricuspid. There is mild aortic valve annular calcification. Aortic valve regurgitation is not visualized. Mild to moderate aortic valve sclerosis/calcification is present, without any evidence of aortic stenosis. Pulmonic Valve: The pulmonic valve was grossly normal. Aorta: Aortic dilatation noted. There is mild dilatation of the ascending aorta, measuring 41 mm. Venous: The  inferior vena cava is normal in size with greater than 50% respiratory variability, suggesting right atrial pressure of 3 mmHg. LEFT VENTRICLE PLAX 2D LVIDd:         4.00 cm LVIDs:         2.60 cm LV PW:         1.30 cm LV IVS:        1.50 cm LVOT diam:     2.00 cm LVOT Area:     3.14 cm  LEFT ATRIUM         Index LA diam:    3.90 cm 1.84 cm/m   AORTA Ao Asc diam: 4.10 cm  SHUNTS Systemic Diam: 2.00 cm Rozann Lesches MD Electronically signed by Rozann Lesches MD Signature Date/Time: 07/20/2021/11:20:06 AM    Final     Recent Labs: Lab Results  Component Value Date   WBC 4.8 07/21/2021   HGB 13.5 07/21/2021   PLT 210 07/21/2021   NA 139 07/21/2021   K 3.7 07/21/2021   CL 106 07/21/2021   CO2 24 07/21/2021   GLUCOSE 84 07/21/2021   BUN 9 07/21/2021   CREATININE 0.76 07/21/2021   BILITOT 0.8 07/21/2021   ALKPHOS 53 07/21/2021   AST 20 07/21/2021   ALT 24 07/21/2021   PROT 5.6 (L) 07/21/2021   ALBUMIN 3.4 (L) 07/21/2021   CALCIUM 9.1 07/21/2021   GFRAA 44 (L) 01/27/2021   QFTBGOLDPLUS NEGATIVE 09/09/2020    Speciality Comments: PLQ Eye Exam: 12/13/17 WNl @ [facility]  Procedures:  No procedures performed Allergies: Oxycontin [oxycodone hcl], Sulfa antibiotics, Tramadol, Penicillins, and Sulfasalazine   Assessment / Plan:     Visit Diagnoses: Rheumatoid arthritis involving multiple sites with positive rheumatoid factor (HCC)  High risk medication use - Kevzara 200 mg sq injections every 14 days-started on 12/21/20,  Arava 10 mg 1 tablet by mouth daily and prednisone 5 mg 2 tablets daily.    Polymyalgia rheumatica (HCC)  Temporal arteritis (HCC)  On prednisone therapy - prednisone 5 mg 2 tablets by mouth daily  Pain in left hip - She has severe end-stage osteoarthritis of the left hip.   Primary osteoarthritis of both knees  DDD (degenerative disc disease), lumbar  Chronic pain syndrome  History of chronic kidney disease - Stage 3b CKD.  Followed by Dr. Theador Hawthorne.   Her GFR is lower than before which could be due to the use of Demadex.  Spinal stenosis, thoracic  History of diverticulitis  Colonic ulcer - Evident on colonoscopy 09/22/2021. Followed by Dr. Laural Golden. Nonbleeding ulcer noted at splenic flexure.   Vitamin D deficiency  History of gastroesophageal reflux (GERD)  History of hypertension  Memory loss  Orders: No orders of the defined types were placed in this encounter.  No orders of the defined types were placed in this encounter.     Follow-Up Instructions: No follow-ups on file.   Ofilia Neas, PA-C  Note - This record has been created using Dragon software.  Chart creation errors have been sought, but may not always  have been located. Such creation errors do not reflect on  the standard of medical care.

## 2021-08-08 DIAGNOSIS — Z79899 Other long term (current) drug therapy: Secondary | ICD-10-CM | POA: Diagnosis not present

## 2021-08-08 DIAGNOSIS — R52 Pain, unspecified: Secondary | ICD-10-CM | POA: Diagnosis not present

## 2021-08-08 DIAGNOSIS — K219 Gastro-esophageal reflux disease without esophagitis: Secondary | ICD-10-CM | POA: Diagnosis not present

## 2021-08-08 DIAGNOSIS — G894 Chronic pain syndrome: Secondary | ICD-10-CM | POA: Diagnosis not present

## 2021-08-08 DIAGNOSIS — E785 Hyperlipidemia, unspecified: Secondary | ICD-10-CM | POA: Diagnosis not present

## 2021-08-15 DIAGNOSIS — R5381 Other malaise: Secondary | ICD-10-CM | POA: Diagnosis not present

## 2021-08-15 DIAGNOSIS — I10 Essential (primary) hypertension: Secondary | ICD-10-CM | POA: Diagnosis not present

## 2021-08-15 DIAGNOSIS — G8929 Other chronic pain: Secondary | ICD-10-CM | POA: Diagnosis not present

## 2021-08-15 DIAGNOSIS — I509 Heart failure, unspecified: Secondary | ICD-10-CM | POA: Diagnosis not present

## 2021-08-15 DIAGNOSIS — G894 Chronic pain syndrome: Secondary | ICD-10-CM | POA: Diagnosis not present

## 2021-08-15 DIAGNOSIS — F4321 Adjustment disorder with depressed mood: Secondary | ICD-10-CM | POA: Diagnosis not present

## 2021-08-15 DIAGNOSIS — Z79899 Other long term (current) drug therapy: Secondary | ICD-10-CM | POA: Diagnosis not present

## 2021-08-18 ENCOUNTER — Ambulatory Visit: Payer: Medicare Other | Admitting: Physician Assistant

## 2021-08-18 DIAGNOSIS — Z8679 Personal history of other diseases of the circulatory system: Secondary | ICD-10-CM

## 2021-08-18 DIAGNOSIS — K633 Ulcer of intestine: Secondary | ICD-10-CM

## 2021-08-18 DIAGNOSIS — E785 Hyperlipidemia, unspecified: Secondary | ICD-10-CM | POA: Diagnosis not present

## 2021-08-18 DIAGNOSIS — M353 Polymyalgia rheumatica: Secondary | ICD-10-CM

## 2021-08-18 DIAGNOSIS — M0579 Rheumatoid arthritis with rheumatoid factor of multiple sites without organ or systems involvement: Secondary | ICD-10-CM

## 2021-08-18 DIAGNOSIS — Z8719 Personal history of other diseases of the digestive system: Secondary | ICD-10-CM

## 2021-08-18 DIAGNOSIS — I509 Heart failure, unspecified: Secondary | ICD-10-CM | POA: Diagnosis not present

## 2021-08-18 DIAGNOSIS — E559 Vitamin D deficiency, unspecified: Secondary | ICD-10-CM

## 2021-08-18 DIAGNOSIS — Z79899 Other long term (current) drug therapy: Secondary | ICD-10-CM

## 2021-08-18 DIAGNOSIS — K219 Gastro-esophageal reflux disease without esophagitis: Secondary | ICD-10-CM | POA: Diagnosis not present

## 2021-08-18 DIAGNOSIS — I10 Essential (primary) hypertension: Secondary | ICD-10-CM | POA: Diagnosis not present

## 2021-08-18 DIAGNOSIS — R5381 Other malaise: Secondary | ICD-10-CM | POA: Diagnosis not present

## 2021-08-18 DIAGNOSIS — M25552 Pain in left hip: Secondary | ICD-10-CM

## 2021-08-18 DIAGNOSIS — R413 Other amnesia: Secondary | ICD-10-CM

## 2021-08-18 DIAGNOSIS — Z7952 Long term (current) use of systemic steroids: Secondary | ICD-10-CM

## 2021-08-18 DIAGNOSIS — M5136 Other intervertebral disc degeneration, lumbar region: Secondary | ICD-10-CM

## 2021-08-18 DIAGNOSIS — Z87448 Personal history of other diseases of urinary system: Secondary | ICD-10-CM

## 2021-08-18 DIAGNOSIS — M316 Other giant cell arteritis: Secondary | ICD-10-CM

## 2021-08-18 DIAGNOSIS — G894 Chronic pain syndrome: Secondary | ICD-10-CM

## 2021-08-18 DIAGNOSIS — G9341 Metabolic encephalopathy: Secondary | ICD-10-CM | POA: Diagnosis not present

## 2021-08-18 DIAGNOSIS — M4804 Spinal stenosis, thoracic region: Secondary | ICD-10-CM

## 2021-08-18 DIAGNOSIS — M170 Bilateral primary osteoarthritis of knee: Secondary | ICD-10-CM

## 2021-08-21 DIAGNOSIS — M79662 Pain in left lower leg: Secondary | ICD-10-CM | POA: Diagnosis not present

## 2021-08-23 DIAGNOSIS — I509 Heart failure, unspecified: Secondary | ICD-10-CM | POA: Diagnosis present

## 2021-08-23 DIAGNOSIS — M1380 Other specified arthritis, unspecified site: Secondary | ICD-10-CM | POA: Diagnosis present

## 2021-08-23 DIAGNOSIS — I129 Hypertensive chronic kidney disease with stage 1 through stage 4 chronic kidney disease, or unspecified chronic kidney disease: Secondary | ICD-10-CM | POA: Diagnosis not present

## 2021-08-23 DIAGNOSIS — I5032 Chronic diastolic (congestive) heart failure: Secondary | ICD-10-CM | POA: Diagnosis not present

## 2021-08-23 DIAGNOSIS — I10 Essential (primary) hypertension: Secondary | ICD-10-CM | POA: Diagnosis present

## 2021-08-23 DIAGNOSIS — E876 Hypokalemia: Secondary | ICD-10-CM | POA: Diagnosis present

## 2021-08-23 DIAGNOSIS — G894 Chronic pain syndrome: Secondary | ICD-10-CM | POA: Diagnosis present

## 2021-08-23 DIAGNOSIS — G9340 Encephalopathy, unspecified: Secondary | ICD-10-CM | POA: Diagnosis present

## 2021-08-23 DIAGNOSIS — K219 Gastro-esophageal reflux disease without esophagitis: Secondary | ICD-10-CM | POA: Diagnosis present

## 2021-08-23 DIAGNOSIS — Z79899 Other long term (current) drug therapy: Secondary | ICD-10-CM | POA: Diagnosis not present

## 2021-08-23 DIAGNOSIS — E211 Secondary hyperparathyroidism, not elsewhere classified: Secondary | ICD-10-CM | POA: Diagnosis not present

## 2021-08-23 DIAGNOSIS — E873 Alkalosis: Secondary | ICD-10-CM | POA: Diagnosis not present

## 2021-08-23 DIAGNOSIS — E870 Hyperosmolality and hypernatremia: Secondary | ICD-10-CM | POA: Diagnosis not present

## 2021-08-23 DIAGNOSIS — R5381 Other malaise: Secondary | ICD-10-CM | POA: Diagnosis not present

## 2021-08-23 DIAGNOSIS — N1832 Chronic kidney disease, stage 3b: Secondary | ICD-10-CM | POA: Diagnosis not present

## 2021-08-23 DIAGNOSIS — M6281 Muscle weakness (generalized): Secondary | ICD-10-CM | POA: Diagnosis present

## 2021-08-23 DIAGNOSIS — G9341 Metabolic encephalopathy: Secondary | ICD-10-CM | POA: Diagnosis present

## 2021-08-23 DIAGNOSIS — E785 Hyperlipidemia, unspecified: Secondary | ICD-10-CM | POA: Diagnosis present

## 2021-08-23 DIAGNOSIS — R52 Pain, unspecified: Secondary | ICD-10-CM | POA: Diagnosis not present

## 2021-08-23 DIAGNOSIS — M353 Polymyalgia rheumatica: Secondary | ICD-10-CM | POA: Diagnosis present

## 2021-08-25 DIAGNOSIS — Z79899 Other long term (current) drug therapy: Secondary | ICD-10-CM | POA: Diagnosis not present

## 2021-08-25 DIAGNOSIS — K219 Gastro-esophageal reflux disease without esophagitis: Secondary | ICD-10-CM | POA: Diagnosis not present

## 2021-08-25 DIAGNOSIS — I509 Heart failure, unspecified: Secondary | ICD-10-CM | POA: Diagnosis not present

## 2021-08-25 DIAGNOSIS — I10 Essential (primary) hypertension: Secondary | ICD-10-CM | POA: Diagnosis not present

## 2021-08-25 DIAGNOSIS — R52 Pain, unspecified: Secondary | ICD-10-CM | POA: Diagnosis not present

## 2021-08-25 DIAGNOSIS — G894 Chronic pain syndrome: Secondary | ICD-10-CM | POA: Diagnosis not present

## 2021-08-25 DIAGNOSIS — R5381 Other malaise: Secondary | ICD-10-CM | POA: Diagnosis not present

## 2021-08-29 DIAGNOSIS — R5381 Other malaise: Secondary | ICD-10-CM | POA: Diagnosis not present

## 2021-08-29 DIAGNOSIS — R52 Pain, unspecified: Secondary | ICD-10-CM | POA: Diagnosis not present

## 2021-08-29 DIAGNOSIS — I10 Essential (primary) hypertension: Secondary | ICD-10-CM | POA: Diagnosis not present

## 2021-08-29 DIAGNOSIS — Z79899 Other long term (current) drug therapy: Secondary | ICD-10-CM | POA: Diagnosis not present

## 2021-08-29 DIAGNOSIS — E785 Hyperlipidemia, unspecified: Secondary | ICD-10-CM | POA: Diagnosis not present

## 2021-08-29 DIAGNOSIS — G894 Chronic pain syndrome: Secondary | ICD-10-CM | POA: Diagnosis not present

## 2021-08-31 DIAGNOSIS — E873 Alkalosis: Secondary | ICD-10-CM | POA: Diagnosis not present

## 2021-08-31 DIAGNOSIS — E211 Secondary hyperparathyroidism, not elsewhere classified: Secondary | ICD-10-CM | POA: Diagnosis not present

## 2021-08-31 DIAGNOSIS — E870 Hyperosmolality and hypernatremia: Secondary | ICD-10-CM | POA: Diagnosis not present

## 2021-08-31 DIAGNOSIS — E876 Hypokalemia: Secondary | ICD-10-CM | POA: Diagnosis not present

## 2021-08-31 DIAGNOSIS — I129 Hypertensive chronic kidney disease with stage 1 through stage 4 chronic kidney disease, or unspecified chronic kidney disease: Secondary | ICD-10-CM | POA: Diagnosis not present

## 2021-08-31 DIAGNOSIS — I5032 Chronic diastolic (congestive) heart failure: Secondary | ICD-10-CM | POA: Diagnosis not present

## 2021-08-31 DIAGNOSIS — N1832 Chronic kidney disease, stage 3b: Secondary | ICD-10-CM | POA: Diagnosis not present

## 2021-09-01 DIAGNOSIS — I10 Essential (primary) hypertension: Secondary | ICD-10-CM | POA: Diagnosis not present

## 2021-09-01 DIAGNOSIS — R52 Pain, unspecified: Secondary | ICD-10-CM | POA: Diagnosis not present

## 2021-09-01 DIAGNOSIS — G894 Chronic pain syndrome: Secondary | ICD-10-CM | POA: Diagnosis not present

## 2021-09-01 DIAGNOSIS — K219 Gastro-esophageal reflux disease without esophagitis: Secondary | ICD-10-CM | POA: Diagnosis not present

## 2021-09-01 DIAGNOSIS — E785 Hyperlipidemia, unspecified: Secondary | ICD-10-CM | POA: Diagnosis not present

## 2021-09-01 DIAGNOSIS — I509 Heart failure, unspecified: Secondary | ICD-10-CM | POA: Diagnosis not present

## 2021-09-05 DIAGNOSIS — Z79899 Other long term (current) drug therapy: Secondary | ICD-10-CM | POA: Diagnosis not present

## 2021-09-05 DIAGNOSIS — G9341 Metabolic encephalopathy: Secondary | ICD-10-CM | POA: Diagnosis not present

## 2021-09-05 DIAGNOSIS — I10 Essential (primary) hypertension: Secondary | ICD-10-CM | POA: Diagnosis not present

## 2021-09-05 DIAGNOSIS — I509 Heart failure, unspecified: Secondary | ICD-10-CM | POA: Diagnosis not present

## 2021-09-05 DIAGNOSIS — R5381 Other malaise: Secondary | ICD-10-CM | POA: Diagnosis not present

## 2021-09-05 DIAGNOSIS — R52 Pain, unspecified: Secondary | ICD-10-CM | POA: Diagnosis not present

## 2021-09-05 DIAGNOSIS — G894 Chronic pain syndrome: Secondary | ICD-10-CM | POA: Diagnosis not present

## 2021-09-12 DIAGNOSIS — I509 Heart failure, unspecified: Secondary | ICD-10-CM | POA: Diagnosis not present

## 2021-09-12 DIAGNOSIS — G894 Chronic pain syndrome: Secondary | ICD-10-CM | POA: Diagnosis not present

## 2021-09-12 DIAGNOSIS — I10 Essential (primary) hypertension: Secondary | ICD-10-CM | POA: Diagnosis not present

## 2021-09-12 DIAGNOSIS — E785 Hyperlipidemia, unspecified: Secondary | ICD-10-CM | POA: Diagnosis not present

## 2021-09-12 DIAGNOSIS — G9341 Metabolic encephalopathy: Secondary | ICD-10-CM | POA: Diagnosis not present

## 2021-09-12 DIAGNOSIS — R52 Pain, unspecified: Secondary | ICD-10-CM | POA: Diagnosis not present

## 2021-09-12 DIAGNOSIS — K219 Gastro-esophageal reflux disease without esophagitis: Secondary | ICD-10-CM | POA: Diagnosis not present

## 2021-09-12 DIAGNOSIS — Z79899 Other long term (current) drug therapy: Secondary | ICD-10-CM | POA: Diagnosis not present

## 2021-09-13 DIAGNOSIS — G894 Chronic pain syndrome: Secondary | ICD-10-CM | POA: Diagnosis not present

## 2021-09-15 ENCOUNTER — Other Ambulatory Visit: Payer: Self-pay

## 2021-09-15 ENCOUNTER — Encounter (HOSPITAL_COMMUNITY): Payer: Self-pay | Admitting: Emergency Medicine

## 2021-09-15 ENCOUNTER — Emergency Department (HOSPITAL_COMMUNITY): Payer: Medicare Other

## 2021-09-15 ENCOUNTER — Inpatient Hospital Stay (HOSPITAL_COMMUNITY)
Admission: EM | Admit: 2021-09-15 | Discharge: 2021-09-19 | DRG: 372 | Disposition: A | Discharge status: Nursing Home (Includes ICF) | Payer: Medicare Other | Attending: Family Medicine | Admitting: Family Medicine

## 2021-09-15 DIAGNOSIS — M609 Myositis, unspecified: Secondary | ICD-10-CM | POA: Diagnosis present

## 2021-09-15 DIAGNOSIS — K7460 Unspecified cirrhosis of liver: Secondary | ICD-10-CM | POA: Diagnosis not present

## 2021-09-15 DIAGNOSIS — G5603 Carpal tunnel syndrome, bilateral upper limbs: Secondary | ICD-10-CM | POA: Diagnosis present

## 2021-09-15 DIAGNOSIS — Z20822 Contact with and (suspected) exposure to covid-19: Secondary | ICD-10-CM | POA: Diagnosis not present

## 2021-09-15 DIAGNOSIS — I5032 Chronic diastolic (congestive) heart failure: Secondary | ICD-10-CM | POA: Diagnosis not present

## 2021-09-15 DIAGNOSIS — R102 Pelvic and perineal pain: Secondary | ICD-10-CM | POA: Diagnosis not present

## 2021-09-15 DIAGNOSIS — E782 Mixed hyperlipidemia: Secondary | ICD-10-CM | POA: Diagnosis present

## 2021-09-15 DIAGNOSIS — N179 Acute kidney failure, unspecified: Secondary | ICD-10-CM | POA: Diagnosis not present

## 2021-09-15 DIAGNOSIS — Z7952 Long term (current) use of systemic steroids: Secondary | ICD-10-CM

## 2021-09-15 DIAGNOSIS — M353 Polymyalgia rheumatica: Secondary | ICD-10-CM | POA: Diagnosis present

## 2021-09-15 DIAGNOSIS — E869 Volume depletion, unspecified: Secondary | ICD-10-CM | POA: Diagnosis present

## 2021-09-15 DIAGNOSIS — K6812 Psoas muscle abscess: Principal | ICD-10-CM | POA: Diagnosis present

## 2021-09-15 DIAGNOSIS — Z888 Allergy status to other drugs, medicaments and biological substances status: Secondary | ICD-10-CM

## 2021-09-15 DIAGNOSIS — Z9049 Acquired absence of other specified parts of digestive tract: Secondary | ICD-10-CM

## 2021-09-15 DIAGNOSIS — E876 Hypokalemia: Secondary | ICD-10-CM | POA: Diagnosis not present

## 2021-09-15 DIAGNOSIS — Z79899 Other long term (current) drug therapy: Secondary | ICD-10-CM

## 2021-09-15 DIAGNOSIS — M1990 Unspecified osteoarthritis, unspecified site: Secondary | ICD-10-CM | POA: Diagnosis present

## 2021-09-15 DIAGNOSIS — I10 Essential (primary) hypertension: Secondary | ICD-10-CM | POA: Diagnosis not present

## 2021-09-15 DIAGNOSIS — R5381 Other malaise: Secondary | ICD-10-CM | POA: Diagnosis present

## 2021-09-15 DIAGNOSIS — I110 Hypertensive heart disease with heart failure: Secondary | ICD-10-CM | POA: Diagnosis present

## 2021-09-15 DIAGNOSIS — I700 Atherosclerosis of aorta: Secondary | ICD-10-CM | POA: Diagnosis not present

## 2021-09-15 DIAGNOSIS — R112 Nausea with vomiting, unspecified: Secondary | ICD-10-CM | POA: Diagnosis not present

## 2021-09-15 DIAGNOSIS — K529 Noninfective gastroenteritis and colitis, unspecified: Secondary | ICD-10-CM | POA: Diagnosis present

## 2021-09-15 DIAGNOSIS — Z6841 Body Mass Index (BMI) 40.0 and over, adult: Secondary | ICD-10-CM

## 2021-09-15 DIAGNOSIS — Z8249 Family history of ischemic heart disease and other diseases of the circulatory system: Secondary | ICD-10-CM

## 2021-09-15 DIAGNOSIS — K7689 Other specified diseases of liver: Secondary | ICD-10-CM | POA: Diagnosis not present

## 2021-09-15 DIAGNOSIS — Z8619 Personal history of other infectious and parasitic diseases: Secondary | ICD-10-CM

## 2021-09-15 DIAGNOSIS — Z79891 Long term (current) use of opiate analgesic: Secondary | ICD-10-CM

## 2021-09-15 DIAGNOSIS — R109 Unspecified abdominal pain: Secondary | ICD-10-CM

## 2021-09-15 DIAGNOSIS — M47816 Spondylosis without myelopathy or radiculopathy, lumbar region: Secondary | ICD-10-CM | POA: Diagnosis not present

## 2021-09-15 DIAGNOSIS — Z882 Allergy status to sulfonamides status: Secondary | ICD-10-CM

## 2021-09-15 DIAGNOSIS — Z7960 Long term (current) use of unspecified immunomodulators and immunosuppressants: Secondary | ICD-10-CM

## 2021-09-15 DIAGNOSIS — Z885 Allergy status to narcotic agent status: Secondary | ICD-10-CM

## 2021-09-15 DIAGNOSIS — M21372 Foot drop, left foot: Secondary | ICD-10-CM | POA: Diagnosis present

## 2021-09-15 DIAGNOSIS — Z880 Allergy status to penicillin: Secondary | ICD-10-CM

## 2021-09-15 DIAGNOSIS — Z8261 Family history of arthritis: Secondary | ICD-10-CM

## 2021-09-15 DIAGNOSIS — G894 Chronic pain syndrome: Secondary | ICD-10-CM | POA: Diagnosis present

## 2021-09-15 DIAGNOSIS — K219 Gastro-esophageal reflux disease without esophagitis: Secondary | ICD-10-CM | POA: Diagnosis present

## 2021-09-15 LAB — URINALYSIS, ROUTINE W REFLEX MICROSCOPIC
Bilirubin Urine: NEGATIVE
Glucose, UA: NEGATIVE mg/dL
Hgb urine dipstick: NEGATIVE
Ketones, ur: NEGATIVE mg/dL
Nitrite: NEGATIVE
Protein, ur: NEGATIVE mg/dL
Specific Gravity, Urine: <1.005 — ABNORMAL LOW (ref 1.005–1.030)
pH: 7 (ref 5.0–8.0)

## 2021-09-15 LAB — PROCALCITONIN: Procalcitonin: <0.1 ng/mL

## 2021-09-15 LAB — CBC WITH DIFFERENTIAL/PLATELET
Abs Immature Granulocytes: 0.08 10*3/uL — ABNORMAL HIGH (ref 0.00–0.07)
Basophils Absolute: 0.1 10*3/uL (ref 0.0–0.1)
Basophils Relative: 0 %
Eosinophils Absolute: 0.1 10*3/uL (ref 0.0–0.5)
Eosinophils Relative: 1 %
HCT: 40.9 % (ref 36.0–46.0)
Hemoglobin: 14.1 g/dL (ref 12.0–15.0)
Immature Granulocytes: 1 %
Lymphocytes Relative: 22 %
Lymphs Abs: 2.6 10*3/uL (ref 0.7–4.0)
MCH: 31.9 pg (ref 26.0–34.0)
MCHC: 34.5 g/dL (ref 30.0–36.0)
MCV: 92.5 fL (ref 80.0–100.0)
Monocytes Absolute: 1 10*3/uL (ref 0.1–1.0)
Monocytes Relative: 8 %
Neutro Abs: 8.2 10*3/uL — ABNORMAL HIGH (ref 1.7–7.7)
Neutrophils Relative %: 68 %
Platelets: 414 10*3/uL — ABNORMAL HIGH (ref 150–400)
RBC: 4.42 MIL/uL (ref 3.87–5.11)
RDW: 13.1 % (ref 11.5–15.5)
WBC: 12.1 10*3/uL — ABNORMAL HIGH (ref 4.0–10.5)
nRBC: 0 % (ref 0.0–0.2)

## 2021-09-15 LAB — COMPREHENSIVE METABOLIC PANEL
ALT: 21 U/L (ref 0–44)
AST: 30 U/L (ref 15–41)
Albumin: 3.5 g/dL (ref 3.5–5.0)
Alkaline Phosphatase: 107 U/L (ref 38–126)
Anion gap: 17 — ABNORMAL HIGH (ref 5–15)
BUN: 24 mg/dL — ABNORMAL HIGH (ref 8–23)
CO2: 27 mmol/L (ref 22–32)
Calcium: 9.1 mg/dL (ref 8.9–10.3)
Chloride: 94 mmol/L — ABNORMAL LOW (ref 98–111)
Creatinine, Ser: 1.49 mg/dL — ABNORMAL HIGH (ref 0.44–1.00)
GFR, Estimated: 36 mL/min — ABNORMAL LOW (ref >60)
Glucose, Bld: 145 mg/dL — ABNORMAL HIGH (ref 70–99)
Potassium: 2.3 mmol/L — CL (ref 3.5–5.1)
Sodium: 138 mmol/L (ref 135–145)
Total Bilirubin: 0.8 mg/dL (ref 0.3–1.2)
Total Protein: 7.8 g/dL (ref 6.5–8.1)

## 2021-09-15 LAB — RESP PANEL BY RT-PCR (FLU A&B, COVID) ARPGX2
Influenza A by PCR: NEGATIVE
Influenza B by PCR: NEGATIVE
SARS Coronavirus 2 by RT PCR: NEGATIVE

## 2021-09-15 LAB — LACTIC ACID, PLASMA: Lactic Acid, Venous: 1.4 mmol/L (ref 0.5–1.9)

## 2021-09-15 LAB — URINALYSIS, MICROSCOPIC (REFLEX)

## 2021-09-15 LAB — MAGNESIUM: Magnesium: 1.4 mg/dL — ABNORMAL LOW (ref 1.7–2.4)

## 2021-09-15 LAB — BASIC METABOLIC PANEL
Anion gap: 15 (ref 5–15)
BUN: 23 mg/dL (ref 8–23)
CO2: 22 mmol/L (ref 22–32)
Calcium: 8.4 mg/dL — ABNORMAL LOW (ref 8.9–10.3)
Chloride: 98 mmol/L (ref 98–111)
Creatinine, Ser: 1.53 mg/dL — ABNORMAL HIGH (ref 0.44–1.00)
GFR, Estimated: 35 mL/min — ABNORMAL LOW (ref >60)
Glucose, Bld: 118 mg/dL — ABNORMAL HIGH (ref 70–99)
Potassium: 3.3 mmol/L — ABNORMAL LOW (ref 3.5–5.1)
Sodium: 135 mmol/L (ref 135–145)

## 2021-09-15 LAB — SEDIMENTATION RATE: Sed Rate: 97 mm/hr — ABNORMAL HIGH (ref 0–22)

## 2021-09-15 LAB — LIPASE, BLOOD: Lipase: 34 U/L (ref 11–51)

## 2021-09-15 MED ORDER — METOPROLOL SUCCINATE ER 25 MG PO TB24: 25.0000 mg | ORAL_TABLET | Freq: Every day | ORAL | Status: DC

## 2021-09-15 MED ORDER — SODIUM CHLORIDE 0.9 % IV SOLN
INTRAVENOUS | Status: DC
  Administered 2021-09-15: 19:00:00 500 mL via INTRAVENOUS

## 2021-09-15 MED ORDER — POTASSIUM CHLORIDE 10 MEQ/100ML IV SOLN
10.0000 meq | INTRAVENOUS | Status: AC
  Administered 2021-09-15 (×3): 10 meq via INTRAVENOUS
  Filled 2021-09-15 (×4): qty 100

## 2021-09-15 MED ORDER — SODIUM CHLORIDE 0.9 % IV SOLN: INTRAVENOUS | Status: DC

## 2021-09-15 MED ORDER — MAGNESIUM SULFATE 2 GM/50ML IV SOLN
2.0000 g | Freq: Once | INTRAVENOUS | Status: AC
  Administered 2021-09-15: 2 g via INTRAVENOUS

## 2021-09-15 MED ORDER — POTASSIUM CHLORIDE CRYS ER 20 MEQ PO TBCR
40.0000 meq | EXTENDED_RELEASE_TABLET | Freq: Four times a day (QID) | ORAL | Status: AC
  Administered 2021-09-15 (×2): 40 meq via ORAL
  Filled 2021-09-15 (×2): qty 2

## 2021-09-15 MED ORDER — TAPENTADOL HCL 50 MG PO TABS: 100.0000 mg | ORAL_TABLET | Freq: Four times a day (QID) | ORAL | Status: DC

## 2021-09-15 MED ORDER — SODIUM CHLORIDE 0.9 % IV BOLUS
500.0000 mL | Freq: Once | INTRAVENOUS | Status: AC
  Administered 2021-09-15: 500 mL via INTRAVENOUS

## 2021-09-15 MED ORDER — TAPENTADOL HCL ER 50 MG PO TB12
200.0000 mg | ORAL_TABLET | Freq: Two times a day (BID) | ORAL | Status: DC
  Administered 2021-09-15 – 2021-09-19 (×8): 200 mg via ORAL
  Filled 2021-09-15 (×8): qty 4

## 2021-09-15 MED ORDER — MAGNESIUM SULFATE 2 GM/50ML IV SOLN
2.0000 g | Freq: Once | INTRAVENOUS | Status: AC
  Administered 2021-09-15: 2 g via INTRAVENOUS
  Filled 2021-09-15: qty 50

## 2021-09-15 MED ORDER — ONDANSETRON HCL 4 MG/2ML IJ SOLN
4.0000 mg | Freq: Four times a day (QID) | INTRAMUSCULAR | Status: DC | PRN
  Administered 2021-09-17 – 2021-09-18 (×4): 4 mg via INTRAVENOUS
  Filled 2021-09-15 (×4): qty 2

## 2021-09-15 MED ORDER — PANTOPRAZOLE SODIUM 40 MG PO TBEC
40.0000 mg | DELAYED_RELEASE_TABLET | Freq: Two times a day (BID) | ORAL | Status: DC
  Administered 2021-09-15 – 2021-09-19 (×8): 40 mg via ORAL
  Filled 2021-09-15 (×8): qty 1

## 2021-09-15 MED ORDER — PREDNISONE 5 MG PO TABS
9.0000 mg | ORAL_TABLET | Freq: Every day | ORAL | Status: DC
  Administered 2021-09-16 – 2021-09-19 (×4): 9 mg via ORAL
  Filled 2021-09-15 (×6): qty 4

## 2021-09-15 MED ORDER — IOHEXOL 300 MG/ML  SOLN
100.0000 mL | Freq: Once | INTRAMUSCULAR | Status: AC | PRN
  Administered 2021-09-15: 80 mL via INTRAVENOUS

## 2021-09-15 MED ORDER — POTASSIUM CHLORIDE 10 MEQ/100ML IV SOLN
10.0000 meq | INTRAVENOUS | Status: AC
Start: 2021-09-15 — End: 2021-09-15
  Administered 2021-09-15 (×3): 10 meq via INTRAVENOUS
  Filled 2021-09-15 (×2): qty 100

## 2021-09-15 MED ORDER — METOPROLOL SUCCINATE ER 25 MG PO TB24
25.0000 mg | ORAL_TABLET | Freq: Every day | ORAL | Status: DC
  Administered 2021-09-16 – 2021-09-19 (×4): 25 mg via ORAL
  Filled 2021-09-15 (×4): qty 1

## 2021-09-15 MED ORDER — ACETAMINOPHEN 325 MG PO TABS
650.0000 mg | ORAL_TABLET | Freq: Four times a day (QID) | ORAL | Status: DC | PRN
  Administered 2021-09-15 – 2021-09-16 (×2): 650 mg via ORAL
  Filled 2021-09-15 (×2): qty 2

## 2021-09-15 MED ORDER — MAGNESIUM SULFATE 2 GM/50ML IV SOLN: 2.0000 g | Freq: Once | INTRAVENOUS | Status: DC

## 2021-09-15 MED ORDER — TAPENTADOL HCL ER 200 MG PO TB12: 1.0000 | ORAL_TABLET | Freq: Two times a day (BID) | ORAL | Status: DC

## 2021-09-15 MED ORDER — MAGNESIUM SULFATE 2 GM/50ML IV SOLN
2.0000 g | Freq: Once | INTRAVENOUS | Status: DC
  Filled 2021-09-15: qty 50

## 2021-09-15 MED ORDER — CYCLOBENZAPRINE HCL 10 MG PO TABS
5.0000 mg | ORAL_TABLET | Freq: Three times a day (TID) | ORAL | Status: DC | PRN
  Administered 2021-09-15 – 2021-09-19 (×6): 5 mg via ORAL
  Filled 2021-09-15 (×7): qty 1

## 2021-09-15 MED ORDER — ATORVASTATIN CALCIUM 20 MG PO TABS
20.0000 mg | ORAL_TABLET | Freq: Every day | ORAL | Status: DC
  Administered 2021-09-16 – 2021-09-18 (×3): 20 mg via ORAL
  Filled 2021-09-15 (×4): qty 1

## 2021-09-15 MED ORDER — ATORVASTATIN CALCIUM 10 MG PO TABS: 20.0000 mg | ORAL_TABLET | Freq: Every day | ORAL | Status: DC

## 2021-09-15 MED ORDER — ENOXAPARIN SODIUM 40 MG/0.4ML IJ SOSY
40.0000 mg | PREFILLED_SYRINGE | INTRAMUSCULAR | Status: DC
  Administered 2021-09-15 – 2021-09-18 (×4): 40 mg via SUBCUTANEOUS
  Filled 2021-09-15 (×4): qty 0.4

## 2021-09-15 NOTE — ED Notes (Signed)
Lactic and bloodcultures x2 obtained at 1327

## 2021-09-15 NOTE — H&P (Signed)
TRH H&P   Patient Demographics:    Nicole [name], is a 75 y.o. female  MRN: [id number]   DOB - [birth year]  Admit Date - 09/15/2021  Outpatient Primary MD for the patient is Jake Samples, PA-C  Referring MD/NP/PA: Marcina Millard    Patient coming from: ALF([facility]), just moved this Monday from [place] rehab  Chief Complaint  Patient presents with   Emesis      HPI:    Nicole [name]  is a 75 y.o. female, with medical history significant for chronic pain syndrome, hypertension, hyperlipidemia, GERD. -Patient was sent from ALF for evaluation for nausea, vomiting and diarrhea for 2 days, patient is a good historian, she is able to provide history, son at bedside assists with the history as well, patient with significant debility, she was discharged from [facility] in September of this year, she stays at [facility] rehab, where she she was discharged to ALF this Monday, patient reports nausea/vomiting/diarrhea with abdominal pain for last 2 days, she is with poor appetite, denies any blood in the stool, no coffee-ground emesis, she does report fever and chills at home, but she is afebrile here. - in ED she was afebrile, but her work-up significant for severe hypokalemia at 2.3, magnesium of 1.4, and AKI with creatinine of 1.49, she has CT abdomen pelvis to evaluate for her nausea/vomiting/diarrhea, with no significant GI findings, but was significant for few gas foci in left psoas muscle most likely related to severe vacuum disc arthritis, [facility] consulted to admit.    Review of systems:    In addition to the HPI above,  No Fever-chills, weakness and fatigue, poor appetite No Headache, No changes with Vision or hearing, No problems swallowing food or Liquids, No Chest pain, Cough or Shortness of Breath, Reports abdominal pain, nausea, vomiting and diarrhea No Blood in  stool or Urine, No dysuria, No new skin rashes or bruises, No new joints pains-aches,  No new weakness, tingling, numbness in any extremity, she has left foot drop has severe chronic lower back pain at baseline No recent weight gain or loss, No polyuria, polydypsia or polyphagia, No significant Mental Stressors.  A full 10 point Review of Systems was done, except as stated above, all other Review of Systems were negative.   With Past History of the following :    Past Medical History:  Diagnosis Date   AC (acromioclavicular) joint bone spurs    lt shoulder   Anemia    Arthritis    Carpal tunnel syndrome, bilateral    CHF (congestive heart failure) (HCC)    Collagen vascular disease (HCC)    Gastroesophageal reflux    Headache    recent visit to ER @ [facility] for severe headache   Hypertension    Lumbar stenosis    Hx of ESIs by Dr. Nelva Bush   Polyarthralgia  Polymyalgia ([place])    Shingles    Spinal stenosis       Past Surgical History:  Procedure Laterality Date   BACK SURGERY  07/05/2018, 07/2018   x2    BIOPSY  09/22/2020   Procedure: BIOPSY;  Surgeon: Rogene Houston, MD;  [facility];  Service: Endoscopy;;   CHOLECYSTECTOMY     COLONOSCOPY  06/11/2012   Procedure: COLONOSCOPY;  Surgeon: Jamesetta So, MD;  Location: [facility];  Service: Gastroenterology;  Laterality: N/A;   COLONOSCOPY WITH PROPOFOL N/A 09/22/2020   Procedure: COLONOSCOPY WITH PROPOFOL;  Surgeon: Rogene Houston, MD;  Location: [facility];  Service: Endoscopy;  Laterality: N/A;  730   HYSTEROSCOPY WITH D & C N/A 02/25/2014   Procedure: DILATATION AND CURETTAGE /HYSTEROSCOPY;  Surgeon: Florian Buff, MD;  Location: [facility];  Service: Gynecology;  Laterality: N/A;   POLYPECTOMY N/A 02/25/2014   Procedure: POLYPECTOMY;  Surgeon: Florian Buff, MD;  Location: [facility];  Service: Gynecology;  Laterality: N/A;   RESECTION DISTAL CLAVICAL Right 03/26/2015   Procedure: OPEN DISTAL CLAVICAL  RESECTION ;  Surgeon: Netta Cedars, MD;  Location: [place];  Service: Orthopedics;  Laterality: Right;      Social History:     Social History   Tobacco Use   Smoking status: Never   Smokeless tobacco: Never  Substance Use Topics   Alcohol use: No     Lives - at ALF  Mobility -wheelchair, walker with PT/OT     Family History :     Family History  Problem Relation Age of Onset   Hypertension Mother    Pneumonia Father    Arthritis Sister    Diabetes Paternal Grandmother       Home Medications:   Prior to Admission medications   Medication Sig Start Date End Date Taking? Authorizing Provider  acetaminophen (TYLENOL) 325 MG tablet Take 2 tablets (650 mg total) by mouth every 6 (six) hours as needed for mild pain or headache (or Fever >/= 101). 05/06/18  Yes Barton Dubois, MD  atorvastatin (LIPITOR) 20 MG tablet Take 20 mg by mouth daily. 01/13/21  Yes [provider]  calcitRIOL (ROCALTROL) 0.25 MCG capsule Take 0.25 mcg by mouth See admin instructions. Take 0.25 every Mon, Wed, Fri. 09/06/20  Yes [provider]  cyclobenzaprine (FLEXERIL) 10 MG tablet Take by mouth every 6 (six) hours as needed for muscle spasms.   Yes [provider]  diclofenac (VOLTAREN) 75 MG EC tablet Take 1 tablet (75 mg total) by mouth daily with breakfast. 07/21/21  Yes Emokpae, Courage, MD  leflunomide (ARAVA) 10 MG tablet TAKE (1) TABLET BY MOUTH DAILY. Patient taking differently: Take 10 mg by mouth daily. 12/27/20  Yes Ofilia Neas, PA-C  Lidocaine 4 % PTCH Apply topically.   Yes [provider]  metolazone (ZAROXOLYN) 5 MG tablet Take 5 mg by mouth 2 (two) times a week. Tuesday and Fridays   Yes [provider]  metoprolol succinate (TOPROL XL) 25 MG 24 hr tablet Take 1 tablet (25 mg total) by mouth daily. 07/21/21 07/21/22 Yes Emokpae, Courage, MD  Multiple Vitamins-Minerals (MULTIVITAMIN PO) Take 1 tablet by mouth daily.    Yes [provider]  NUCYNTA ER 200 MG TB12 SMARTSIG:1 Tablet(s) By Mouth Every 12 Hours 09/14/21  Yes [provider]  pantoprazole (PROTONIX) 40 MG tablet Take 40 mg by mouth 2 (two) times daily.  08/20/13  Yes Milton Ferguson,  MD  potassium chloride (KLOR-CON) 10 MEQ tablet Take 10 mEq by mouth daily. 09/06/20  Yes [provider]  predniSONE (DELTASONE) 1 MG tablet Take 9 tablets (9 mg total) by mouth daily with breakfast. 07/22/21  Yes Emokpae, Courage, MD  Simethicone (GAS-X PO) Take 1 tablet by mouth 3 (three) times daily as needed (flatulence).    Yes [provider]  torsemide 40 MG TABS Take 40 mg by mouth 2 (two) times daily. 07/21/21  Yes Emokpae, Courage, MD  cyclobenzaprine (FLEXERIL) 5 MG tablet Take 5 mg by mouth every 12 (twelve) hours as needed for muscle spasms.  02/20/20   [provider]  diclofenac (VOLTAREN) 75 MG EC tablet Take by mouth. Patient not taking: Reported on 09/15/2021    [provider]  diclofenac Sodium (VOLTAREN) 1 % GEL Apply 2 g topically 4 (four) times daily as needed (pain).     [provider]  furosemide (LASIX) 40 MG tablet Take by mouth. Patient not taking: Reported on 09/15/2021    [provider]  gabapentin (NEURONTIN) 400 MG capsule Take 400 mg by mouth 3 (three) times daily.  Patient not taking: Reported on 09/15/2021 06/17/19   [provider]  HYDROmorphone (DILAUDID) 4 MG tablet  06/23/21   [provider]  leflunomide (ARAVA) 10 MG tablet Take by mouth. Patient not taking: Reported on 09/15/2021    [provider]  lidocaine (LIDODERM) 5 % Place 1-2 patches onto the skin every 12 (twelve) hours as needed (pain).  Patient not taking: Reported on 09/15/2021 02/19/20   [provider]  methylPREDNISolone (MEDROL DOSEPAK) 4 MG TBPK tablet  06/20/21   [provider]  oxyCODONE (ROXICODONE) 5 MG immediate release tablet Take 1 tablet (5 mg total) by mouth every 4  (four) hours as needed for breakthrough pain. Patient not taking: Reported on 09/15/2021 07/21/21   Roxan Hockey, MD  pantoprazole (PROTONIX) 40 MG tablet Take by mouth. Patient not taking: Reported on 09/15/2021    [provider]  potassium chloride (KLOR-CON) 10 MEQ tablet Take by mouth. Patient not taking: Reported on 09/15/2021    [provider]  Sarilumab [facility]) 200 MG/1.14ML SOAJ Inject 200 mg into the skin every 14 (fourteen) days. Patient not taking: Reported on 09/15/2021 05/09/21   Ofilia Neas, PA-C     Allergies:     Allergies  Allergen Reactions   Oxycontin [Oxycodone Hcl] Swelling    Pt tolerates hydromorphone.   Sulfa Antibiotics Other (See Comments)    Sores   Tramadol     "Makes me feel like I am falling"   Penicillins Rash   Sulfasalazine Other (See Comments)    Sores     Physical Exam:   Vitals  Blood pressure (!) 135/98, pulse (!) 102, temperature 98.3 F (36.8 C), temperature source Oral, resp. rate (!) 22, height 5\' 1"  (1.549 m), weight 108.9 kg, SpO2 96 %.   1. General developed female, laying in bed in no apparent distress  2. Normal affect and insight, Not Suicidal or Homicidal, Awake Alert, Oriented X 3.  3. No F.N deficits, ALL C.Nerves Intact, Strength 5/5 all 4 extremities, Sensation intact all 4 extremities, Plantars down going.  4. Ears and Eyes appear Normal, Conjunctivae clear, PERRLA. Moist Oral Mucosa.  5. Supple Neck, No JVD, No cervical lymphadenopathy appriciated, No Carotid Bruits.  6. Symmetrical Chest wall movement, Good air movement bilaterally, CTAB.  7. RRR, No Gallops, Rubs or Murmurs, No Parasternal Heave.  8. Positive Bowel Sounds, Abdomen Soft, left abdomen tenderness to palpation,, No organomegaly appriciated,No rebound -guarding or rigidity.  9.  No Cyanosis, Normal Skin Turgor, No Skin Rash or Bruise.  10. Good muscle tone,  joints appear normal , no effusions, Normal ROM.  She has left  foot drop  11. No Palpable Lymph Nodes in Neck or Axillae     Data Review:    CBC Recent Labs  Lab 09/15/21 1055  WBC 12.1*  HGB 14.1  HCT 40.9  PLT 414*  MCV 92.5  MCH 31.9  MCHC 34.5  RDW 13.1  LYMPHSABS 2.6  MONOABS 1.0  EOSABS 0.1  BASOSABS 0.1   ------------------------------------------------------------------------------------------------------------------  Chemistries  Recent Labs  Lab 09/15/21 1055 09/15/21 1225  NA 138  --   K 2.3*  --   CL 94*  --   CO2 27  --   GLUCOSE 145*  --   BUN 24*  --   CREATININE 1.49*  --   CALCIUM 9.1  --   MG  --  1.4*  AST 30  --   ALT 21  --   ALKPHOS 107  --   BILITOT 0.8  --    ------------------------------------------------------------------------------------------------------------------ estimated creatinine clearance is 37.2 mL/min (A) (by C-G formula based on SCr of 1.49 mg/dL (H)). ------------------------------------------------------------------------------------------------------------------ No results for input(s): TSH, T4TOTAL, T3FREE, THYROIDAB in the last 72 hours.  Invalid input(s): FREET3  Coagulation profile No results for input(s): INR, PROTIME in the last 168 hours. ------------------------------------------------------------------------------------------------------------------- No results for input(s): DDIMER in the last 72 hours. -------------------------------------------------------------------------------------------------------------------  Cardiac Enzymes No results for input(s): CKMB, TROPONINI, MYOGLOBIN in the last 168 hours.  Invalid input(s): CK ------------------------------------------------------------------------------------------------------------------ No results found for: BNP   ---------------------------------------------------------------------------------------------------------------  Urinalysis    Component Value Date/Time   COLORURINE YELLOW 09/15/2021 [place] 09/15/2021 1348   LABSPEC <1.005 (L) 09/15/2021 1348   PHURINE 7.0 09/15/2021 1348   GLUCOSEU NEGATIVE 09/15/2021 1348   HGBUR NEGATIVE 09/15/2021 [place] 09/15/2021 1348   BILIRUBINUR negative 02/14/2020 [place] 09/15/2021 1348   PROTEINUR NEGATIVE 09/15/2021 1348   UROBILINOGEN 0.2 02/14/2020 1328   UROBILINOGEN 0.2 02/20/2014 1116   NITRITE NEGATIVE 09/15/2021 1348   LEUKOCYTESUR TRACE (A) 09/15/2021 1348    ----------------------------------------------------------------------------------------------------------------   Imaging Results:    CT Abdomen Pelvis W Contrast  Result Date: 09/15/2021 CLINICAL DATA:  75 year old female with acute LEFT abdominal and pelvic pain for 2 days. EXAM: CT ABDOMEN AND PELVIS WITH CONTRAST TECHNIQUE: Multidetector CT imaging of the abdomen and pelvis was performed using the standard protocol following bolus administration of intravenous contrast. CONTRAST:  99mL OMNIPAQUE IOHEXOL 300 MG/ML  SOLN COMPARISON:  06/21/2021 CT and prior studies FINDINGS: Lower chest: No acute abnormality Hepatobiliary: Mildly nodular hepatic contour or likely represent cirrhosis. No other hepatic abnormalities are noted. Patient is status post cholecystectomy. No biliary dilatation. Pancreas: Unremarkable Spleen: Unremarkable Adrenals/Urinary Tract: The kidneys, adrenal glands and bladder are unremarkable. No hydronephrosis or renal calculi. Stomach/Bowel: Scattered diverticula within the descending and sigmoid colon noted without evidence of acute diverticulitis. There is no evidence of bowel obstruction or focal collection. The appendix is normal. Vascular/Lymphatic: Aortic atherosclerosis. No enlarged abdominal or pelvic lymph nodes. Reproductive: No new abnormalities. A 4 cm LEFT adnexal mass appears unchanged from remote studies. Other: No ascites or pneumoperitoneum. Musculoskeletal: Again noted is moderate to  severe multilevel degenerative disc disease, spondylosis and facet arthropathy throughout the lumbar spine with  vacuum disc phenomenon. However, now noted are foci of gas within the LEFT psoas muscle which appears to extend from the L3-4 or L4-5 vacuum discs. No definite LEFT psoas focal collection or enhancement is identified. IMPRESSION: 1. New foci of gas within the LEFT psoas muscle which appears to extend from L3-4 or L4-5 degenerative vacuum discs. Although this may represent noninfectious extension of gas from the disc space, infection is not excluded although there is no definite evidence of focal collection/enhancement of the LEFT psoas muscle. Recommend MRI with and without contrast for further evaluation as clinically indicated. 2. Mildly nodular hepatic contour suspicious for cirrhosis. 3. Aortic Atherosclerosis (ICD10-I70.0). Electronically Signed   By: Margarette Canada M.D.   On: 09/15/2021 12:46     EKG:  Vent. rate 114 BPM PR interval 166 ms QRS duration 108 ms QT/QTcB 355/489 ms P-R-T axes 26 -69 10 Sinus tachycardia Probable left atrial enlargement LAD, consider LAFB or inferio  Assessment & Plan:    Principal Problem:   AKI (acute kidney injury) ([place]) Active Problems:   GERD (gastroesophageal reflux disease)   HTN (hypertension)   Chronic diastolic HF (heart failure) (HCC)   Hypokalemia  AKI -No, due to volume depletion from nausea/vomiting/diarrhea and poor oral intake, will keep on gentle hydration, hold Lasix we will monitor closely for volume overload given her history of chronic CHF. -Hold p.o. Voltaren as well  Nausea, vomiting,and diarrhea -this Is most likely due to gastroenteritis, no concerning GI finding and CT abdomen and pelvis, she will be kept on clear liquid diet, as needed nausea medications, will advance diet as tolerated  Hypokalemia -Due to nausea, vomiting and diarrhea, will replete, monitor closely  Haypomagnesemia -No at 1.4, will  replete  Chronic diastolic CHF -We will hold Lasix and metolazone given her AKI, will monitor closely for volume overload as on IV fluids   Abnormal MRI finding left psoas muscle gas foci -This is most likely due to vacuum generative disc disease as ED physician discussed with Dr. Trenton Gammon from [facility]surgery at [facility], recommendation to check sed rate, and consider MRI only if is extremely elevated.  Essential hypertension -Continue with Toprol-XL  Mixed hyperlipidemia - Continue statin   Chronic pain syndrome -Continue with home medications but will hold Voltaren -She is chronically on steroids as well  GERD Continue PPI   Debility/deconditioning/left foot drop -Continue with PT/OT  DVT Prophylaxis Lovenox  AM Labs Ordered, also please review Full Orders  Family Communication: Admission, patients condition and plan of care including tests being ordered have been discussed with the patient and son at bedside who indicate understanding and agree with the plan and Code Status.  Code Status Full  Likely DC to  back to ALF  Condition GUARDED    Consults called: None    Admission status: observation    Time spent in minutes : 60 minutes   Phillips Climes M.D on 09/15/2021 at 3:42 PM   [facility] - Office  [phone]

## 2021-09-15 NOTE — Plan of Care (Signed)
  Problem: Education: Goal: Knowledge of General Education information will improve Description Including pain rating scale, medication(s)/side effects and non-pharmacologic comfort measures Outcome: Progressing   

## 2021-09-15 NOTE — ED Triage Notes (Signed)
Pt to the ED with nausea,vomiting and diarrhea for the past 2 days.

## 2021-09-15 NOTE — ED Provider Notes (Signed)
[facility] Provider Note   CSN: [id number] Arrival date & time: 09/15/21  1029     History Chief Complaint  Patient presents with   Emesis    Nicole [name] is a 75 y.o. female.  HPI She presents for evaluation of nausea, vomiting and diarrhea for several days.  She has not seen any blood in her stool.  She is here with her son who gives most of the history.  She is debilitated and was recently admitted to an assisted living facility.  She apparently had a fever, this morning.  She had decreased appetite and some vomiting, yesterday.  She has a history of diverticulitis.  There is been no recent cough, shortness of breath, change in behavior.  There are no other known active modifying factors.    Past Medical History:  Diagnosis Date   AC (acromioclavicular) joint bone spurs    lt shoulder   Anemia    Arthritis    Carpal tunnel syndrome, bilateral    CHF (congestive heart failure) (HCC)    Collagen vascular disease (HCC)    Gastroesophageal reflux    Headache    recent visit to ER @ [facility] for severe headache   Hypertension    Lumbar stenosis    Hx of ESIs by Dr. Nelva Bush   Polyarthralgia    Polymyalgia [facility])    Shingles    Spinal stenosis     Patient Active Problem List   Diagnosis Date Noted   Acute metabolic encephalopathy 56/43/3295   Hypokalemia 07/20/2021   Elevated MCV 07/20/2021   Mixed hyperlipidemia 07/20/2021   Diverticulitis 05/02/2018   Diverticulitis of colon 05/01/2018   Morbid obesity with BMI of 50.0-59.9, adult ([place]) 05/01/2018   Chronic diastolic HF (heart failure) ([place]) 05/01/2018   On prednisone therapy 05/31/2017   Positive anti-CCP test 05/31/2017   Rheumatoid factor positive 05/31/2017   Memory loss 04/04/2017   Polymyalgia rheumatica ([place]) 04/04/2017   High risk medication use 12/07/2016   DDD (degenerative disc disease), lumbar 09/27/2016   HTN (hypertension) 06/05/2016   Chronic pain syndrome 06/05/2016    Acute diverticulitis 06/02/2016   Diverticulitis large intestine 06/02/2016   Spinal stenosis of lumbosacral region 02/29/2016   Joint pain 02/29/2016   Endometrial polyp 02/06/2014   Postmenopausal bleeding 01/30/2014   GERD (gastroesophageal reflux disease) 09/29/2013   Spinal stenosis, thoracic 09/29/2013   Shingles 09/29/2013   Thoracic or lumbosacral neuritis or radiculitis, unspecified 05/04/2011   Abnormality of gait 05/04/2011   Muscle weakness (generalized) 05/04/2011   Bilateral primary osteoarthritis of knee 04/07/2009   KNEE PAIN 04/07/2009    Past Surgical History:  Procedure Laterality Date   BACK SURGERY  07/05/2018, 07/2018   x2    BIOPSY  09/22/2020   Procedure: BIOPSY;  Surgeon: Rogene Houston, MD;  [facility];  Service: Endoscopy;;   CHOLECYSTECTOMY     COLONOSCOPY  06/11/2012   Procedure: COLONOSCOPY;  Surgeon: Jamesetta So, MD;  [facility];  Service: Gastroenterology;  Laterality: N/A;   COLONOSCOPY WITH PROPOFOL N/A 09/22/2020   Procedure: COLONOSCOPY WITH PROPOFOL;  Surgeon: Rogene Houston, MD;  [facility];  Service: Endoscopy;  Laterality: N/A;  730   HYSTEROSCOPY WITH D & C N/A 02/25/2014   Procedure: DILATATION AND CURETTAGE /HYSTEROSCOPY;  Surgeon: Florian Buff, MD;  Location: [facility];  Service: Gynecology;  Laterality: N/A;   POLYPECTOMY N/A 02/25/2014   Procedure: POLYPECTOMY;  Surgeon: Mertie Clause  Elonda Husky, MD;  Location: [facility];  Service: Gynecology;  Laterality: N/A;   RESECTION DISTAL CLAVICAL Right 03/26/2015   Procedure: OPEN DISTAL CLAVICAL RESECTION ;  Surgeon: Netta Cedars, MD;  Location: [facility];  Service: Orthopedics;  Laterality: Right;     OB History     Gravida  2   Para      Term      Preterm      AB      Living  2      SAB      IAB      Ectopic      Multiple      Live Births              Family History  Problem Relation Age of Onset   Hypertension Mother    Pneumonia Father     Arthritis Sister    Diabetes Paternal Grandmother     Social History   Tobacco Use   Smoking status: Never   Smokeless tobacco: Never  Vaping Use   Vaping Use: Never used  Substance Use Topics   Alcohol use: No   Drug use: No    Home Medications Prior to Admission medications   Medication Sig Start Date End Date Taking? Authorizing Provider  acetaminophen (TYLENOL) 325 MG tablet Take 2 tablets (650 mg total) by mouth every 6 (six) hours as needed for mild pain or headache (or Fever >/= 101). 05/06/18  Yes Barton Dubois, MD  atorvastatin (LIPITOR) 20 MG tablet Take 20 mg by mouth daily. 01/13/21  Yes [provider]  calcitRIOL (ROCALTROL) 0.25 MCG capsule Take 0.25 mcg by mouth See admin instructions. Take 0.25 every Mon, Wed, Fri. 09/06/20  Yes [provider]  cyclobenzaprine (FLEXERIL) 10 MG tablet Take by mouth every 6 (six) hours as needed for muscle spasms.   Yes [provider]  diclofenac (VOLTAREN) 75 MG EC tablet Take 1 tablet (75 mg total) by mouth daily with breakfast. 07/21/21  Yes Emokpae, Courage, MD  leflunomide (ARAVA) 10 MG tablet TAKE (1) TABLET BY MOUTH DAILY. Patient taking differently: Take 10 mg by mouth daily. 12/27/20  Yes Ofilia Neas, PA-C  Lidocaine 4 % PTCH Apply topically.   Yes [provider]  metolazone (ZAROXOLYN) 5 MG tablet Take 5 mg by mouth 2 (two) times a week. Tuesday and Fridays   Yes [provider]  metoprolol succinate (TOPROL XL) 25 MG 24 hr tablet Take 1 tablet (25 mg total) by mouth daily. 07/21/21 07/21/22 Yes Emokpae, Courage, MD  Multiple Vitamins-Minerals (MULTIVITAMIN PO) Take 1 tablet by mouth daily.    Yes [provider]  NUCYNTA ER 200 MG TB12 SMARTSIG:1 Tablet(s) By Mouth Every 12 Hours 09/14/21  Yes [provider]  pantoprazole (PROTONIX) 40 MG tablet Take 40 mg by mouth 2 (two) times daily.  08/20/13  Yes Milton Ferguson, MD  potassium chloride (KLOR-CON) 10 MEQ tablet  Take 10 mEq by mouth daily. 09/06/20  Yes [provider]  predniSONE (DELTASONE) 1 MG tablet Take 9 tablets (9 mg total) by mouth daily with breakfast. 07/22/21  Yes Emokpae, Courage, MD  Simethicone (GAS-X PO) Take 1 tablet by mouth 3 (three) times daily as needed (flatulence).    Yes [provider]  torsemide 40 MG TABS Take 40 mg by mouth 2 (two) times daily. 07/21/21  Yes Emokpae, Courage, MD  cyclobenzaprine (FLEXERIL) 5 MG tablet Take 5 mg by mouth every 12 (twelve) hours as needed  for muscle spasms.  02/20/20   [provider]  diclofenac (VOLTAREN) 75 MG EC tablet Take by mouth. Patient not taking: Reported on 09/15/2021    [provider]  diclofenac Sodium (VOLTAREN) 1 % GEL Apply 2 g topically 4 (four) times daily as needed (pain).     [provider]  furosemide (LASIX) 40 MG tablet Take by mouth. Patient not taking: Reported on 09/15/2021    [provider]  gabapentin (NEURONTIN) 400 MG capsule Take 400 mg by mouth 3 (three) times daily.  Patient not taking: Reported on 09/15/2021 06/17/19   [provider]  HYDROmorphone (DILAUDID) 4 MG tablet  06/23/21   [provider]  leflunomide (ARAVA) 10 MG tablet Take by mouth. Patient not taking: Reported on 09/15/2021    [provider]  lidocaine (LIDODERM) 5 % Place 1-2 patches onto the skin every 12 (twelve) hours as needed (pain).  Patient not taking: Reported on 09/15/2021 02/19/20   [provider]  methylPREDNISolone (MEDROL DOSEPAK) 4 MG TBPK tablet  06/20/21   [provider]  oxyCODONE (ROXICODONE) 5 MG immediate release tablet Take 1 tablet (5 mg total) by mouth every 4 (four) hours as needed for breakthrough pain. Patient not taking: Reported on 09/15/2021 07/21/21   Roxan Hockey, MD  pantoprazole (PROTONIX) 40 MG tablet Take by mouth. Patient not taking: Reported on 09/15/2021    [provider]  potassium chloride  (KLOR-CON) 10 MEQ tablet Take by mouth. Patient not taking: Reported on 09/15/2021    [provider]  Sarilumab [facility]) 200 MG/1.14ML SOAJ Inject 200 mg into the skin every 14 (fourteen) days. Patient not taking: Reported on 09/15/2021 05/09/21   Ofilia Neas, PA-C    Allergies    Oxycontin [oxycodone hcl], Sulfa antibiotics, Tramadol, Penicillins, and Sulfasalazine  Review of Systems   Review of Systems  All other systems reviewed and are negative.  Physical Exam Updated Vital Signs BP (!) 135/98   Pulse (!) 102   Temp 98.3 F (36.8 C) (Oral)   Resp (!) 22   Ht 5\' 1"  (1.549 m)   Wt 108.9 kg   SpO2 96%   BMI 45.35 kg/m   Physical Exam Vitals and nursing note reviewed.  Constitutional:      General: She is not in acute distress.    Appearance: She is well-developed. She is obese. She is ill-appearing. She is not toxic-appearing or diaphoretic.  HENT:     Head: Normocephalic and atraumatic.     Right Ear: External ear normal.     Left Ear: External ear normal.  Eyes:     Conjunctiva/sclera: Conjunctivae normal.     Pupils: Pupils are equal, round, and reactive to light.  Neck:     Trachea: Phonation normal.  Cardiovascular:     Rate and Rhythm: Regular rhythm. Tachycardia present.     Heart sounds: Normal heart sounds.  Pulmonary:     Effort: Pulmonary effort is normal. No respiratory distress.     Breath sounds: Normal breath sounds. No stridor.  Abdominal:     General: There is no distension.     Palpations: Abdomen is soft. There is no mass.     Tenderness: There is abdominal tenderness (Left lower quadrant, mild). There is no guarding or rebound.  Musculoskeletal:        General: No tenderness.     Cervical back: Normal range of motion and neck supple.  Skin:    General: Skin is  warm and dry.  Neurological:     Mental Status: She is alert.     Cranial Nerves: No cranial nerve deficit.     Sensory: No sensory deficit.     Motor: No abnormal  muscle tone.     Coordination: Coordination normal.  Psychiatric:        Mood and Affect: Mood normal.        Behavior: Behavior normal.    ED Results / Procedures / Treatments   Labs (all labs ordered are listed, but only abnormal results are displayed) Labs Reviewed  COMPREHENSIVE METABOLIC PANEL - Abnormal; Notable for the following components:      Result Value   Potassium 2.3 (*)    Chloride 94 (*)    Glucose, Bld 145 (*)    BUN 24 (*)    Creatinine, Ser 1.49 (*)    GFR, Estimated 36 (*)    Anion gap 17 (*)    All other components within normal limits  CBC WITH DIFFERENTIAL/PLATELET - Abnormal; Notable for the following components:   WBC 12.1 (*)    Platelets 414 (*)    Neutro Abs 8.2 (*)    Abs Immature Granulocytes 0.08 (*)    All other components within normal limits  MAGNESIUM - Abnormal; Notable for the following components:   Magnesium 1.4 (*)    All other components within normal limits  URINALYSIS, ROUTINE W REFLEX MICROSCOPIC - Abnormal; Notable for the following components:   Specific Gravity, Urine <1.005 (*)    Leukocytes,Ua TRACE (*)    All other components within normal limits  URINALYSIS, MICROSCOPIC (REFLEX) - Abnormal; Notable for the following components:   Bacteria, UA RARE (*)    Non Squamous Epithelial PRESENT (*)    All other components within normal limits  RESP PANEL BY RT-PCR (FLU A&B, COVID) ARPGX2  CULTURE, BLOOD (ROUTINE X 2)  CULTURE, BLOOD (ROUTINE X 2)  LIPASE, BLOOD  LACTIC ACID, PLASMA    EKG EKG Interpretation  Date/Time:  Thursday September 15 2021 10:48:37 EST Ventricular Rate:  114 PR Interval:  166 QRS Duration: 108 QT Interval:  355 QTC Calculation: 489 R Axis:   -69 Text Interpretation: Sinus tachycardia Probable left atrial enlargement LAD, consider LAFB or inferior infarct Probable lateral infarct, old Since last tracing rate faster Otherwise no significant change Confirmed by Daleen Bo [phone]) on 09/15/2021  12:29:15 PM  Radiology CT Abdomen Pelvis W Contrast  Result Date: 09/15/2021 CLINICAL DATA:  75 year old female with acute LEFT abdominal and pelvic pain for 2 days. EXAM: CT ABDOMEN AND PELVIS WITH CONTRAST TECHNIQUE: Multidetector CT imaging of the abdomen and pelvis was performed using the standard protocol following bolus administration of intravenous contrast. CONTRAST:  67mL OMNIPAQUE IOHEXOL 300 MG/ML  SOLN COMPARISON:  06/21/2021 CT and prior studies FINDINGS: Lower chest: No acute abnormality Hepatobiliary: Mildly nodular hepatic contour or likely represent cirrhosis. No other hepatic abnormalities are noted. Patient is status post cholecystectomy. No biliary dilatation. Pancreas: Unremarkable Spleen: Unremarkable Adrenals/Urinary Tract: The kidneys, adrenal glands and bladder are unremarkable. No hydronephrosis or renal calculi. Stomach/Bowel: Scattered diverticula within the descending and sigmoid colon noted without evidence of acute diverticulitis. There is no evidence of bowel obstruction or focal collection. The appendix is normal. Vascular/Lymphatic: Aortic atherosclerosis. No enlarged abdominal or pelvic lymph nodes. Reproductive: No new abnormalities. A 4 cm LEFT adnexal mass appears unchanged from remote studies. Other: No ascites or pneumoperitoneum. Musculoskeletal: Again noted is moderate to severe multilevel degenerative disc disease, spondylosis  and facet arthropathy throughout the lumbar spine with vacuum disc phenomenon. However, now noted are foci of gas within the LEFT psoas muscle which appears to extend from the L3-4 or L4-5 vacuum discs. No definite LEFT psoas focal collection or enhancement is identified. IMPRESSION: 1. New foci of gas within the LEFT psoas muscle which appears to extend from L3-4 or L4-5 degenerative vacuum discs. Although this may represent noninfectious extension of gas from the disc space, infection is not excluded although there is no definite evidence of  focal collection/enhancement of the LEFT psoas muscle. Recommend MRI with and without contrast for further evaluation as clinically indicated. 2. Mildly nodular hepatic contour suspicious for cirrhosis. 3. Aortic Atherosclerosis (ICD10-I70.0). Electronically Signed   By: Margarette Canada M.D.   On: 09/15/2021 12:46    Procedures .Critical Care Performed by: Daleen Bo, MD Authorized by: Daleen Bo, MD   Critical care provider statement:    Critical care time (minutes):  35   Critical care start time:  09/15/2021 11:30 AM   Critical care end time:  09/15/2021 3:00 PM   Critical care time was exclusive of:  Separately billable procedures and treating other patients   Critical care was necessary to treat or prevent imminent or life-threatening deterioration of the following conditions:  Metabolic crisis   Critical care was time spent personally by me on the following activities:  Blood draw for specimens, development of treatment plan with patient or surrogate, discussions with consultants, evaluation of patient's response to treatment, examination of patient, ordering and performing treatments and interventions, ordering and review of laboratory studies, ordering and review of radiographic studies, pulse oximetry, re-evaluation of patient's condition and review of old charts   Medications Ordered in ED Medications  potassium chloride 10 mEq in 100 mL IVPB (10 mEq Intravenous New Bag/Given 09/15/21 1423)  magnesium sulfate IVPB 2 g 50 mL (has no administration in time range)  sodium chloride 0.9 % bolus 500 mL (0 mLs Intravenous Stopped 09/15/21 1205)  iohexol (OMNIPAQUE) 300 MG/ML solution 100 mL (80 mLs Intravenous Contrast Given 09/15/21 1217)    ED Course  I have reviewed the triage vital signs and the nursing notes.  Pertinent labs & imaging results that were available during my care of the patient were reviewed by me and considered in my medical decision making (see chart for  details).    MDM Rules/Calculators/A&P                            Patient Vitals for the past 24 hrs:  BP Temp Temp src Pulse Resp SpO2 Height Weight  09/15/21 1300 (!) 135/98 98.3 F (36.8 C) Oral (!) 102 (!) 22 96 % -- --  09/15/21 1039 -- -- -- -- -- -- 5\' 1"  (1.549 m) 108.9 kg  09/15/21 1038 (!) 132/96 98.1 F (36.7 C) Oral (!) 122 18 94 % -- --      Medical Decision Making:  This patient is presenting for evaluation of vomiting diarrhea and abdominal pain with fever, which does require a range of treatment options, and is a complaint that involves a high risk of morbidity and mortality. The differential diagnoses include diverticulitis, enteritis, viral illness, secondary disorders including metabolic. I decided to review old records, and in summary elderly female, debilitated, presenting with signs and symptoms of recurrent diverticulitis.  I did not require additional historical information from anyone.  Clinical Laboratory Tests Ordered, included CBC, Metabolic panel, and  is, magnesium, viral panel . Review indicates normal except potassium low, magnesium low, chloride low, glucose high, BUN high, creatinine high. Radiologic Tests Ordered, included CT abdomen pelvis.  Questionable back cumene tests abdominal and lumbar with release of gas into the left psoas muscle.  Nonspecific findings and may require further evaluation with advanced imaging..  I independently Visualized: Radiologic images, which show as mentioned above  Cardiac Monitor Tracing which shows sinus tachycardia    Critical Interventions-clinical evaluation, laboratory testing, IV fluids, CT imaging, observation, reassessment, medication management  After These Interventions, the Patient was reevaluated and was found with nonspecific abdominal pain, possibly from lumbar source, no evidence for diverticulitis.  Incidental moderate metabolic crisis, requiring intervention with medications to stabilize cardiac  status.  No cardiac instability during ED evaluation.  Patient is critically ill and requires close observation in the inpatient setting.  Referral for further management of left psoas gas to oncoming team including admitting doctor, after consultation with neurosurgery.  CRITICAL CARE-yes Performed by: Daleen Bo  Nursing Notes Reviewed/ Care Coordinated Applicable Imaging Reviewed Interpretation of Laboratory Data incorporated into ED treatment   Plan admission following consultation with neurosurgery.  Dr. Melina Copa to manage this.    Final Clinical Impression(s) / ED Diagnoses Final diagnoses:  Hypokalemia  Hypomagnesemia  Enteritis  AKI (acute kidney injury) [facility])    Rx / [place] Orders ED Discharge Orders     None        Daleen Bo, MD 09/17/21 [phone]

## 2021-09-15 NOTE — ED Provider Notes (Signed)
Signout from Dr. Eulis Foster.  75 year old female recent transitioning from rehab to assisted living here with a few days of fever nausea vomiting.  Has low potassium low magnesium AKI.  Will need admission.  Abnormal finding will need to be reviewed by neurosurgery.  Dr. Irven [place] been paged. Physical Exam  BP (!) 135/98   Pulse (!) 102   Temp 98.3 F (36.8 C) (Oral)   Resp (!) 22   Ht 5\' 1"  (1.549 m)   Wt 108.9 kg   SpO2 96%   BMI 45.35 kg/m   Physical Exam  ED Course/Procedures     Procedures  MDM  Dr. Trenton Gammon neurosurgery reviewed the CT.  He feels this is all just related to her degenerative disc disease and does not think this is infectious in etiology.  Recommends getting a sed rate and if this is extremely elevated such as over 100 then consider getting the MRI to exclude such.  Otherwise does not feel an MRI indicated.  He felt that otherwise getting an MRI tomorrow would be reasonable and she does not need transfer.  Discussed with [facility] hospitalist Dr. Waldron Labs who will evaluate patient for admission.       Hayden Rasmussen, MD 09/16/21 [phone]

## 2021-09-16 ENCOUNTER — Observation Stay (HOSPITAL_COMMUNITY): Payer: Medicare Other

## 2021-09-16 ENCOUNTER — Inpatient Hospital Stay (HOSPITAL_COMMUNITY): Payer: Medicare Other

## 2021-09-16 DIAGNOSIS — R109 Unspecified abdominal pain: Secondary | ICD-10-CM | POA: Diagnosis not present

## 2021-09-16 DIAGNOSIS — M609 Myositis, unspecified: Secondary | ICD-10-CM | POA: Diagnosis present

## 2021-09-16 DIAGNOSIS — I5032 Chronic diastolic (congestive) heart failure: Secondary | ICD-10-CM | POA: Diagnosis present

## 2021-09-16 DIAGNOSIS — E869 Volume depletion, unspecified: Secondary | ICD-10-CM | POA: Diagnosis present

## 2021-09-16 DIAGNOSIS — M545 Low back pain, unspecified: Secondary | ICD-10-CM | POA: Diagnosis not present

## 2021-09-16 DIAGNOSIS — Z9049 Acquired absence of other specified parts of digestive tract: Secondary | ICD-10-CM | POA: Diagnosis not present

## 2021-09-16 DIAGNOSIS — I10 Essential (primary) hypertension: Secondary | ICD-10-CM | POA: Diagnosis not present

## 2021-09-16 DIAGNOSIS — R5381 Other malaise: Secondary | ICD-10-CM | POA: Diagnosis present

## 2021-09-16 DIAGNOSIS — K6812 Psoas muscle abscess: Secondary | ICD-10-CM | POA: Diagnosis present

## 2021-09-16 DIAGNOSIS — N838 Other noninflammatory disorders of ovary, fallopian tube and broad ligament: Secondary | ICD-10-CM | POA: Diagnosis not present

## 2021-09-16 DIAGNOSIS — N179 Acute kidney failure, unspecified: Secondary | ICD-10-CM | POA: Diagnosis present

## 2021-09-16 DIAGNOSIS — K567 Ileus, unspecified: Secondary | ICD-10-CM | POA: Diagnosis not present

## 2021-09-16 DIAGNOSIS — Z8619 Personal history of other infectious and parasitic diseases: Secondary | ICD-10-CM | POA: Diagnosis not present

## 2021-09-16 DIAGNOSIS — M160 Bilateral primary osteoarthritis of hip: Secondary | ICD-10-CM | POA: Diagnosis not present

## 2021-09-16 DIAGNOSIS — G5603 Carpal tunnel syndrome, bilateral upper limbs: Secondary | ICD-10-CM | POA: Diagnosis present

## 2021-09-16 DIAGNOSIS — Z20822 Contact with and (suspected) exposure to covid-19: Secondary | ICD-10-CM | POA: Diagnosis present

## 2021-09-16 DIAGNOSIS — M47816 Spondylosis without myelopathy or radiculopathy, lumbar region: Secondary | ICD-10-CM | POA: Diagnosis not present

## 2021-09-16 DIAGNOSIS — Z8261 Family history of arthritis: Secondary | ICD-10-CM | POA: Diagnosis not present

## 2021-09-16 DIAGNOSIS — Z8249 Family history of ischemic heart disease and other diseases of the circulatory system: Secondary | ICD-10-CM | POA: Diagnosis not present

## 2021-09-16 DIAGNOSIS — E782 Mixed hyperlipidemia: Secondary | ICD-10-CM | POA: Diagnosis present

## 2021-09-16 DIAGNOSIS — Z6841 Body Mass Index (BMI) 40.0 and over, adult: Secondary | ICD-10-CM | POA: Diagnosis not present

## 2021-09-16 DIAGNOSIS — K7460 Unspecified cirrhosis of liver: Secondary | ICD-10-CM | POA: Diagnosis present

## 2021-09-16 DIAGNOSIS — M21372 Foot drop, left foot: Secondary | ICD-10-CM | POA: Diagnosis present

## 2021-09-16 DIAGNOSIS — G894 Chronic pain syndrome: Secondary | ICD-10-CM | POA: Diagnosis present

## 2021-09-16 DIAGNOSIS — I110 Hypertensive heart disease with heart failure: Secondary | ICD-10-CM | POA: Diagnosis present

## 2021-09-16 DIAGNOSIS — K529 Noninfective gastroenteritis and colitis, unspecified: Secondary | ICD-10-CM | POA: Diagnosis present

## 2021-09-16 DIAGNOSIS — E876 Hypokalemia: Secondary | ICD-10-CM | POA: Diagnosis present

## 2021-09-16 DIAGNOSIS — M353 Polymyalgia rheumatica: Secondary | ICD-10-CM | POA: Diagnosis present

## 2021-09-16 DIAGNOSIS — K219 Gastro-esophageal reflux disease without esophagitis: Secondary | ICD-10-CM | POA: Diagnosis present

## 2021-09-16 DIAGNOSIS — R112 Nausea with vomiting, unspecified: Secondary | ICD-10-CM | POA: Diagnosis not present

## 2021-09-16 LAB — BASIC METABOLIC PANEL
Anion gap: 14 (ref 5–15)
BUN: 23 mg/dL (ref 8–23)
CO2: 25 mmol/L (ref 22–32)
Calcium: 8.3 mg/dL — ABNORMAL LOW (ref 8.9–10.3)
Chloride: 100 mmol/L (ref 98–111)
Creatinine, Ser: 1.52 mg/dL — ABNORMAL HIGH (ref 0.44–1.00)
GFR, Estimated: 36 mL/min — ABNORMAL LOW (ref >60)
Glucose, Bld: 88 mg/dL (ref 70–99)
Potassium: 3.1 mmol/L — ABNORMAL LOW (ref 3.5–5.1)
Sodium: 139 mmol/L (ref 135–145)

## 2021-09-16 LAB — CBC
HCT: 36.5 % (ref 36.0–46.0)
Hemoglobin: 12.2 g/dL (ref 12.0–15.0)
MCH: 32 pg (ref 26.0–34.0)
MCHC: 33.4 g/dL (ref 30.0–36.0)
MCV: 95.8 fL (ref 80.0–100.0)
Platelets: 371 10*3/uL (ref 150–400)
RBC: 3.81 MIL/uL — ABNORMAL LOW (ref 3.87–5.11)
RDW: 13.4 % (ref 11.5–15.5)
WBC: 7.5 10*3/uL (ref 4.0–10.5)
nRBC: 0 % (ref 0.0–0.2)

## 2021-09-16 LAB — PROCALCITONIN: Procalcitonin: <0.1 ng/mL

## 2021-09-16 MED ORDER — SODIUM CHLORIDE 0.9 % IV SOLN
1.0000 g | INTRAVENOUS | Status: DC
  Administered 2021-09-16: 1 g via INTRAVENOUS
  Filled 2021-09-16: qty 10

## 2021-09-16 MED ORDER — VANCOMYCIN HCL 1250 MG/250ML IV SOLN: 1250.0000 mg | INTRAVENOUS | Status: DC

## 2021-09-16 MED ORDER — VANCOMYCIN HCL 2000 MG/400ML IV SOLN
2000.0000 mg | Freq: Once | INTRAVENOUS | Status: AC
  Administered 2021-09-16: 2000 mg via INTRAVENOUS
  Filled 2021-09-16 (×2): qty 400

## 2021-09-16 MED ORDER — FENTANYL CITRATE PF 50 MCG/ML IJ SOSY
25.0000 ug | PREFILLED_SYRINGE | Freq: Once | INTRAMUSCULAR | Status: AC
  Administered 2021-09-16: 25 ug via INTRAVENOUS
  Filled 2021-09-16: qty 1

## 2021-09-16 MED ORDER — HYDROCODONE-ACETAMINOPHEN 7.5-325 MG PO TABS
1.0000 | ORAL_TABLET | Freq: Four times a day (QID) | ORAL | Status: DC | PRN
  Administered 2021-09-16 – 2021-09-19 (×6): 1 via ORAL
  Filled 2021-09-16 (×7): qty 1

## 2021-09-16 MED ORDER — POTASSIUM CHLORIDE CRYS ER 20 MEQ PO TBCR
40.0000 meq | EXTENDED_RELEASE_TABLET | ORAL | Status: AC
  Administered 2021-09-16 (×2): 40 meq via ORAL
  Filled 2021-09-16: qty 4
  Filled 2021-09-16: qty 2

## 2021-09-16 MED ORDER — FENTANYL CITRATE PF 50 MCG/ML IJ SOSY
25.0000 ug | PREFILLED_SYRINGE | INTRAMUSCULAR | Status: DC | PRN
  Administered 2021-09-16 – 2021-09-19 (×8): 25 ug via INTRAVENOUS
  Filled 2021-09-16 (×8): qty 1

## 2021-09-16 NOTE — Evaluation (Signed)
Occupational Therapy Evaluation Patient Details Name: Nicole [name] MRN: [id number] DOB: 12-21-1945 Today's Date: 09/16/2021   History of Present Illness Patient was sent from ALF for evaluation for nausea, vomiting and diarrhea for 2 days. in ED she was afebrile, but her work-up significant for severe hypokalemia at 2.3, magnesium of 1.4, and AKI with creatinine of 1.49, she has CT abdomen pelvis to evaluate for her nausea/vomiting/diarrhea, with no significant GI findings, but was significant for few gas foci in left psoas muscle most likely related to severe vacuum disc arthritis.   Clinical Impression   Pt agreeable to OT/PT co-evaluation, pt reporting pain in back and abdomen, unable to get comfortable in any position except lying flat with legs slightly elevated. Pt primarily using wheelchair at baseline, ALF staff assist with mobility in bathroom using RW. Pt requires assistance with ADLs at baseline, more limited now due to pt's abdominal pain. Recommend [facility] OT services upon return to ALF to improve pt's independence and safety with ADL completion. No further acute OT services required at this time.       Recommendations for follow up therapy are one component of a multi-disciplinary discharge planning process, led by the attending physician.  Recommendations may be updated based on patient status, additional functional criteria and insurance authorization.   Follow Up Recommendations  Home health OT    Assistance Recommended at Discharge Intermittent Supervision/Assistance  Functional Status Assessment  Patient has had a recent decline in their functional status and/or demonstrates limited ability to make significant improvements in function in a reasonable and predictable amount of time  Equipment Recommendations  None recommended by OT       Precautions / Restrictions Precautions Precautions: Fall Restrictions Weight Bearing Restrictions: No      Mobility Bed Mobility Overal  bed mobility: Needs Assistance Bed Mobility: Supine to Sit;Sit to Supine           General bed mobility comments: Defer to PT note    Transfers                   General transfer comment: Defer to PT note          ADL either performed or assessed with clinical judgement   ADL Overall ADL's : Needs assistance/impaired Eating/Feeding: Set up;Bed level   Grooming: Set up;Bed level   Upper Body Bathing: Maximal assistance;Bed level   Lower Body Bathing: Maximal assistance;Bed level   Upper Body Dressing : Maximal assistance;Sitting;Bed level   Lower Body Dressing: Maximal assistance;Bed level   Toilet Transfer: Minimal assistance;Stand-pivot;Rolling walker (2 wheels) Toilet Transfer Details (indicate cue type and reason): simulated with transfer to chair Toileting- Clothing Manipulation and Hygiene: Maximal assistance;Sit to/from stand;Sitting/lateral lean         General ADL Comments: Pt requires signficant assistance for ADLs, suspect at baseline from pt description of assist at [place] Vision/History: 0 No visual deficits Ability to See in Adequate Light: 0 Adequate Patient Visual Report: No change from baseline Vision Assessment?: No apparent visual deficits            Pertinent Vitals/Pain Pain Assessment: Faces Faces Pain Scale: Hurts whole lot Pain Location: back and abdomen Pain Descriptors / Indicators: Grimacing;Guarding;Moaning Pain Intervention(s): Limited activity within patient's tolerance;Monitored during session;Repositioned     Hand Dominance Right   Extremity/Trunk Assessment Upper Extremity Assessment Upper Extremity Assessment: Generalized weakness   Lower Extremity Assessment Lower Extremity Assessment: Defer to PT evaluation   Cervical /  Trunk Assessment Cervical / Trunk Assessment: Kyphotic   Communication Communication Communication: No difficulties   Cognition Arousal/Alertness:  Awake/alert Behavior During Therapy: WFL for tasks assessed/performed Overall Cognitive Status: Within Functional Limits for tasks assessed                                                  Home Living Family/patient expects to be discharged to:: Assisted living                                        Prior Functioning/Environment Prior Level of Function : Needs assist       Physical Assist : Mobility (physical);ADLs (physical) Mobility (physical): Bed mobility;Transfers ADLs (physical): Grooming;Bathing;Dressing;Toileting;IADLs Mobility Comments: Pt is wheelchair bound at baseline, uses RW for bathroom accessibility. ADLs Comments: ALF staff assist with all ADLs        OT Problem List: Decreased strength;Decreased activity tolerance;Pain                       Co-evaluation PT/OT/SLP Co-Evaluation/Treatment: Yes Reason for Co-Treatment: Complexity of the patient's impairments (multi-system involvement);To address functional/ADL transfers   OT goals addressed during session: ADL's and self-care;Proper use of Adaptive equipment and DME      AM-PAC OT "6 Clicks" Daily Activity     Outcome Measure Help from another person eating meals?: None Help from another person taking care of personal grooming?: A Little Help from another person toileting, which includes using toliet, bedpan, or urinal?: A Lot Help from another person bathing (including washing, rinsing, drying)?: A Lot Help from another person to put on and taking off regular upper body clothing?: A Lot Help from another person to put on and taking off regular lower body clothing?: A Lot 6 Click Score: 15   End of Session Equipment Utilized During Treatment: Rolling walker (2 wheels)  Activity Tolerance: Patient limited by pain Patient left: in bed;with call bell/phone within reach;with bed alarm set  OT Visit Diagnosis: Muscle weakness (generalized) (M62.81);Pain Pain -  Right/Left: Right Pain - part of body:  (back and abdomen)                Time: 3354-5625 OT Time Calculation (min): 20 min Charges:  OT General Charges $OT Visit: 1 Visit OT Evaluation $OT Eval Low Complexity: 1 Low   Guadelupe Sabin, OTR/L  [phone] 09/16/2021, 9:45 AM

## 2021-09-16 NOTE — Progress Notes (Signed)
PROGRESS NOTE     Nicole [name], is a 75 y.o. female, DOB - 29-Oct-1945, YDX:[id number]  Admit date - 09/15/2021   Admitting Physician Rawan Riendeau Denton Brick, MD  Outpatient Primary MD for the patient is Jake Samples, PA-C  LOS - 0  Chief Complaint  Patient presents with   Emesis        Brief Narrative:  75 y.o. female, with medical history significant for chronic pain syndrome, hypertension, hyperlipidemia, GERD--- admitted with Lt Psoas Muscle Abscess and N/V/D on 09/15/21  Assessment & Plan:   Principal Problem:   AKI (acute kidney injury) ([place]) Active Problems:   GERD (gastroesophageal reflux disease)   HTN (hypertension)   Chronic diastolic HF (heart failure) (HCC)   Hypokalemia   Myositis   1)Lt Psoas Muscle Abscess--- MRI of the pelvis and lumbar spine reveals 2.0 cm fluid collection in the left psoas muscle at the L5 level concerning for abscess with a possible additional smaller abscess at the L4 level. -Discussed with general surgeon Dr. Arnoldo Morale abscess is too small for drainage -Treat empirically with Vanco and Rocephin WBC 12.1 >> 7.5 ESR 97 Fevers are Resolving   2)HTN--continue metoprolol  3) chronic pain syndrome--- continue Nucynta, may use IV fentanyl as needed breakthrough pain  4)HLD--continue Lipitor  5) hypokalemia/hypomagnesemia----presumably due to diarrhea replace and recheck  6)HFpEF--chronic diastolic dysfunction CHF, Lasix and metolazone on hold due to vomiting and diarrhea -Continue Toprol-XL  7)Generalized weakness/Debility/deconditioning/left foot drop  8)Possible Liver Cirrhosis--on CT abd/pelvis , will get Liver Cirrhosis   9)Social/Ethics--- from ALF at [facility], recently dced from SNF ,  -will need PT  eval to see if she can return to ALF  Disposition/Need for in-Hospital Stay- patient unable to be discharged at this time due to left psoas abscess requiring IV antibiotics -Possible discharge to ALF Vs SNF  in 1 to 2 days if  improves Discussed with Son -[name] Status is: Inpatient  Remains inpatient appropriate because: Please see disposition above  Disposition: The patient is from: ALF              Anticipated d/c is to: ALF              Anticipated d/c date is: 2 days              Patient currently is not medically stable to d/c. Barriers: Not Clinically Stable-   Code Status :  -  Code Status: Full Code   Family Communication:   (patient is alert, awake and coherent)  Discussed with son [name] Consults  :    DVT Prophylaxis  :   - SCDs   enoxaparin (LOVENOX) injection 40 mg Start: 09/15/21 1800 SCDs Start: 09/15/21 1624    Lab Results  Component Value Date   PLT 371 09/16/2021    Inpatient Medications  Scheduled Meds:  atorvastatin  20 mg Oral q1800   enoxaparin (LOVENOX) injection  40 mg Subcutaneous Q24H   metoprolol succinate  25 mg Oral Daily   pantoprazole  40 mg Oral BID   predniSONE  9 mg Oral Q breakfast   tapentadol  200 mg Oral Q12H   Continuous Infusions:  sodium chloride 50 mL/hr at 09/16/21 0727   PRN Meds:.acetaminophen, cyclobenzaprine, fentaNYL (SUBLIMAZE) injection, HYDROcodone-acetaminophen, ondansetron (ZOFRAN) IV   Anti-infectives (From admission, onward)    None         Subjective: Nicole [name] today has no fevers, no emesis,  No chest pain,     Objective: Vitals:  09/15/21 1745 09/15/21 2145 09/16/21 0420 09/16/21 1359  BP: 123/71 117/70 133/84 110/76  Pulse: 95 91 98 87  Resp: '20 18 18   ' Temp: 98.3 F (36.8 C) 97.9 F (36.6 C) 98.1 F (36.7 C) 98.4 F (36.9 C)  TempSrc: Oral Oral  Oral  SpO2: 97% 97% 92% 97%  Weight:      Height:        Intake/Output Summary (Last 24 hours) at 09/16/2021 1558 Last data filed at 09/16/2021 1300 Gross per 24 hour  Intake 1000.52 ml  Output --  Net 1000.52 ml   Filed Weights   09/15/21 1039  Weight: 108.9 kg     Physical Exam  Gen:- Awake Alert, morbidly obese HEENT:- Nicole [name].AT, No sclera  icterus Neck-Supple Neck,No JVD,.  Lungs-  CTAB , fair symmetrical air movement CV- S1, S2 normal, regular  Abd-  +ve B.Sounds, Abd Soft, No tenderness,    Extremity/Skin: pedal pulses present  Psych-affect is appropriate, oriented x3 Neuro-no new focal deficits, no tremors MSK-bilateral thigh pain left more than right, overlying skin without erythema or streaking  Data Reviewed: I have personally reviewed following labs and imaging studies  CBC: Recent Labs  Lab 09/15/21 1055 09/16/21 0601  WBC 12.1* 7.5  NEUTROABS 8.2*  --   HGB 14.1 12.2  HCT 40.9 36.5  MCV 92.5 95.8  PLT 414* 838   Basic Metabolic Panel: Recent Labs  Lab 09/15/21 1055 09/15/21 1225 09/15/21 2131 09/16/21 0601  NA 138  --  135 139  K 2.3*  --  3.3* 3.1*  CL 94*  --  98 100  CO2 27  --  22 25  GLUCOSE 145*  --  118* 88  BUN 24*  --  23 23  CREATININE 1.49*  --  1.53* 1.52*  CALCIUM 9.1  --  8.4* 8.3*  MG  --  1.4*  --   --    GFR: Estimated Creatinine Clearance: 36.4 mL/min (A) (by C-G formula based on SCr of 1.52 mg/dL (H)). Liver Function Tests: Recent Labs  Lab 09/15/21 1055  AST 30  ALT 21  ALKPHOS 107  BILITOT 0.8  PROT 7.8  ALBUMIN 3.5   Recent Labs  Lab 09/15/21 1055  LIPASE 34   No results for input(s): AMMONIA in the last 168 hours. Coagulation Profile: No results for input(s): INR, PROTIME in the last 168 hours. Cardiac Enzymes: No results for input(s): CKTOTAL, CKMB, CKMBINDEX, TROPONINI in the last 168 hours. BNP (last 3 results) No results for input(s): PROBNP in the last 8760 hours. HbA1C: No results for input(s): HGBA1C in the last 72 hours. CBG: No results for input(s): GLUCAP in the last 168 hours. Lipid Profile: No results for input(s): CHOL, HDL, LDLCALC, TRIG, CHOLHDL, LDLDIRECT in the last 72 hours. Thyroid Function Tests: No results for input(s): TSH, T4TOTAL, FREET4, T3FREE, THYROIDAB in the last 72 hours. Anemia Panel: No results for input(s):  VITAMINB12, FOLATE, FERRITIN, TIBC, IRON, RETICCTPCT in the last 72 hours. Urine analysis:    Component Value Date/Time   COLORURINE YELLOW 09/15/2021 [place] 09/15/2021 1348   LABSPEC <1.005 (L) 09/15/2021 1348   PHURINE 7.0 09/15/2021 1348   GLUCOSEU NEGATIVE 09/15/2021 1348   HGBUR NEGATIVE 09/15/2021 [place] 09/15/2021 1348   BILIRUBINUR negative 02/14/2020 [place] 09/15/2021 1348   PROTEINUR NEGATIVE 09/15/2021 1348   UROBILINOGEN 0.2 02/14/2020 1328   UROBILINOGEN 0.2 02/20/2014 1116   NITRITE NEGATIVE 09/15/2021  [place] (A) 09/15/2021 1348   Sepsis Labs: '@LABRCNTIP' (procalcitonin:4,lacticidven:4)  ) Recent Results (from the past 240 hour(s))  Resp Panel by RT-PCR (Flu A&B, Covid) Nasopharyngeal Swab     Status: None   Collection Time: 09/15/21 11:08 AM   Specimen: Nasopharyngeal Swab; Nasopharyngeal(NP) swabs in vial transport medium  Result Value Ref Range Status   SARS Coronavirus 2 by RT PCR NEGATIVE NEGATIVE Final    Comment: (NOTE) SARS-CoV-2 target nucleic acids are NOT DETECTED.  The SARS-CoV-2 RNA is generally detectable in upper respiratory specimens during the acute phase of infection. The lowest concentration of SARS-CoV-2 viral copies this assay can detect is 138 copies/mL. A negative result does not preclude SARS-Cov-2 infection and should not be used as the sole basis for treatment or other patient management decisions. A negative result may occur with  improper specimen collection/handling, submission of specimen other than nasopharyngeal swab, presence of viral mutation(s) within the areas targeted by this assay, and inadequate number of viral copies(<138 copies/mL). A negative result must be combined with clinical observations, patient history, and epidemiological information. The expected result is Negative.  Fact Sheet for Patients:   EntrepreneurPulse.com.au  Fact Sheet for Healthcare Providers:  IncredibleEmployment.be  This test is no t yet approved or cleared by the [place] FDA and  has been authorized for detection and/or diagnosis of SARS-CoV-2 by FDA under an Emergency Use Authorization (EUA). This EUA will remain  in effect (meaning this test can be used) for the duration of the COVID-19 declaration under Section 564(b)(1) of the Act, 21 U.S.C.section 360bbb-3(b)(1), unless the authorization is terminated  or revoked sooner.       Influenza A by PCR NEGATIVE NEGATIVE Final   Influenza B by PCR NEGATIVE NEGATIVE Final    Comment: (NOTE) The Xpert Xpress SARS-CoV-2/FLU/RSV plus assay is intended as an aid in the diagnosis of influenza from Nasopharyngeal swab specimens and should not be used as a sole basis for treatment. Nasal washings and aspirates are unacceptable for Xpert Xpress SARS-CoV-2/FLU/RSV testing.  Fact Sheet for Patients: EntrepreneurPulse.com.au  Fact Sheet for Healthcare Providers: IncredibleEmployment.be  This test is not yet approved or cleared by the [place] FDA and has been authorized for detection and/or diagnosis of SARS-CoV-2 by FDA under an Emergency Use Authorization (EUA). This EUA will remain in effect (meaning this test can be used) for the duration of the COVID-19 declaration under Section 564(b)(1) of the Act, 21 U.S.C. section 360bbb-3(b)(1), unless the authorization is terminated or revoked.  Performed at [facility], [address], [place], [place] [address]   Culture, blood (routine x 2)     Status: None (Preliminary result)   Collection Time: 09/15/21  1:28 PM   Specimen: BLOOD RIGHT HAND  Result Value Ref Range Status   Specimen Description BLOOD RIGHT HAND  Final   Special Requests   Final    Blood Culture adequate volume BOTTLES DRAWN AEROBIC AND ANAEROBIC   Culture    Final    NO GROWTH < 24 HOURS Performed at [facility], [address], [place], [place] [address]    Report Status PENDING  Incomplete  Culture, blood (routine x 2)     Status: None (Preliminary result)   Collection Time: 09/15/21  1:28 PM   Specimen: BLOOD LEFT ARM  Result Value Ref Range Status   Specimen Description BLOOD LEFT ARM  Final   Special Requests   Final    Blood Culture adequate volume BOTTLES DRAWN  AEROBIC AND ANAEROBIC   Culture   Final    NO GROWTH < 24 HOURS Performed at [facility], [address], [place], [place] [address]    Report Status PENDING  Incomplete      Radiology Studies: MR LUMBAR SPINE WO CONTRAST  Result Date: 09/16/2021 CLINICAL DATA:  Back pain common gas seen in psoas muscle on recent CT EXAM: MRI LUMBAR SPINE WITHOUT CONTRAST TECHNIQUE: Multiplanar, multisequence MR imaging of the lumbar spine was performed. No intravenous contrast was administered. COMPARISON:  CT abdomen/pelvis dated 1 day prior, lumbar spine MRI 06/21/2021 FINDINGS: Segmentation: There is transitional anatomy with lumbarization of the S1 vertebral body. For the purposes of this report, there is a rudimentary S1-S2 disc space. Alignment:  There is trace retrolisthesis of L1 on L2 and L2 on L3. Vertebrae: There are postsurgical changes reflecting posterior decompression from L2 through L5. Vertebral body heights are preserved. There is multilevel degenerative endplate marrow signal abnormality throughout the lumbar spine. There is no suspicious marrow signal abnormality. There is no marrow edema to suggest infection. There is no definite evidence of septic arthritis. Conus medullaris and cauda equina: Conus extends to the L1-L2 level. Conus and cauda equina appear normal. Paraspinal and other soft tissues: There is a 2.0 cm x 0.8 cm by 1.5 cm fluid collection in the left psoas muscle at the L5 level. There may be an additional smaller abscess measuring up to 0.8 cm more superiorly at  the L4 level (7-15, 11-18). The paraspinal soft tissues are otherwise unremarkable. Disc levels: There is multilevel intervertebral disc space narrowing. Marked T2 hypointensity in the anterior aspect of the L3-L4 and L4-L5 disc spaces is consistent with vacuum disc phenomenon. There is advanced multilevel facet arthropathy with prominent effusions at L4-L5 and L5-S1, similar to the prior study. T12-L1: Is a mild disc bulge and ligamentum flavum thickening resulting in mild spinal canal stenosis and mild-to-moderate bilateral neural foraminal stenosis. L1-L2: Status post posterior decompression. There is a prominent diffuse disc bulge, degenerative endplate change, and bilateral facet arthropathy resulting in mild spinal canal stenosis and severe bilateral neural foraminal stenosis, unchanged. L2-L3: Status post posterior decompression. There is a mild disc bulge, degenerative endplate change, and bilateral facet arthropathy resulting in severe bilateral neural foraminal stenosis without significant spinal canal stenosis, unchanged. L3-L4: Status post posterior decompression. There is a prominent diffuse disc bulge, degenerative endplate change, and bilateral facet arthropathy resulting in severe bilateral neural foraminal stenosis without significant spinal canal stenosis, unchanged. L4-L5: Status post posterior decompression. There is a prominent diffuse disc bulge, degenerative endplate change, and bilateral facet arthropathy resulting in mild spinal canal stenosis and severe bilateral neural foraminal stenosis, unchanged. L5-S1: There is a diffuse disc bulge with a left subarticular zone annular fissure, degenerative endplate change, and bilateral facet arthropathy resulting in severe left worse than right neural foraminal stenosis without significant spinal canal stenosis, unchanged. IMPRESSION: 1. Postcontrast images were not obtained as the patient could not complete the study. Within this confines: 2. 2.0 cm  fluid collection in the left psoas muscle at the L5 level concerning for abscess with a possible additional smaller abscess at the L4 level. 3. No abnormal marrow edema to suggest adjacent discitis/osteomyelitis, though as above, postcontrast images were not obtained. 4. Severe multilevel degenerative changes as above, not significantly changed since 06/21/2021. Electronically Signed   By: Valetta Mole M.D.   On: 09/16/2021 14:07   MR PELVIS WO CONTRAST  Result Date: 09/16/2021 CLINICAL DATA:  Intramuscular  gas within the left iliopsoas muscle on CT. Severe back pain. EXAM: MRI PELVIS WITHOUT CONTRAST TECHNIQUE: Multiplanar multisequence MR imaging of the pelvis was performed. No intravenous contrast was administered. Patient was unable to continue the examination. No contrast was administered. Lumbar spine findings are dictated separately. COMPARISON:  Abdominopelvic CT 09/15/2021 and 06/21/2021. FINDINGS: Bones/Joint/Cartilage Within the pelvis, there is no evidence of acute fracture or dislocation. There are mild degenerative changes of both hips. No evidence of avascular necrosis or significant hip joint effusion. The sacroiliac joints are intact with mild degenerative changes on the left. There is no sacroiliac joint effusion or suspicious subchondral signal abnormality. There is multilevel lumbar spondylosis status post multilevel posterior decompression. Lumbar spine findings are dictated separately. Ligaments Not relevant for exam/indication. Muscles and Tendons Associated with the gas in the left iliopsoas muscle on recent CT is a small amount of muscular edema as well as a small fluid collection measuring up to 1.8 cm posterior to the muscle at the L5 level. No drainable fluid collection identified. Muscular edema extends into the pelvis without associated focal fluid collection. There is no significant fluid in the iliopsoas bursa. There is asymmetric fatty atrophy of the left gluteus, piriformis and  hamstring muscles. No acute tendon tear identified. Soft tissues There is a chronic left adnexal mass measuring 3.7 cm on image 29/7. This demonstrates low T1 and T2 signal, corresponding with calcification on CT. This mass appears grossly unchanged from CTs dating back to 08/20/2013. As above, left paraspinal inflammatory changes and gas within the ileus psoas muscle. No other focal fluid collections are identified. IMPRESSION: 1. There is a small amount of muscular edema within the left iliopsoas muscle, associated with a small paraspinal fluid collection at the L5 level. See separate examination of the lumbar spine. 2. No intrapelvic fluid collections. 3. Mild degenerative changes of the hips and sacroiliac joints. 4. Stable partially calcified left adnexal mass from at least 2014, consistent with a benign finding. Electronically Signed   By: Richardean Sale M.D.   On: 09/16/2021 13:50   CT Abdomen Pelvis W Contrast  Result Date: 09/15/2021 CLINICAL DATA:  75 year old female with acute LEFT abdominal and pelvic pain for 2 days. EXAM: CT ABDOMEN AND PELVIS WITH CONTRAST TECHNIQUE: Multidetector CT imaging of the abdomen and pelvis was performed using the standard protocol following bolus administration of intravenous contrast. CONTRAST:  3m OMNIPAQUE IOHEXOL 300 MG/ML  SOLN COMPARISON:  06/21/2021 CT and prior studies FINDINGS: Lower chest: No acute abnormality Hepatobiliary: Mildly nodular hepatic contour or likely represent cirrhosis. No other hepatic abnormalities are noted. Patient is status post cholecystectomy. No biliary dilatation. Pancreas: Unremarkable Spleen: Unremarkable Adrenals/Urinary Tract: The kidneys, adrenal glands and bladder are unremarkable. No hydronephrosis or renal calculi. Stomach/Bowel: Scattered diverticula within the descending and sigmoid colon noted without evidence of acute diverticulitis. There is no evidence of bowel obstruction or focal collection. The appendix is normal.  Vascular/Lymphatic: Aortic atherosclerosis. No enlarged abdominal or pelvic lymph nodes. Reproductive: No new abnormalities. A 4 cm LEFT adnexal mass appears unchanged from remote studies. Other: No ascites or pneumoperitoneum. Musculoskeletal: Again noted is moderate to severe multilevel degenerative disc disease, spondylosis and facet arthropathy throughout the lumbar spine with vacuum disc phenomenon. However, now noted are foci of gas within the LEFT psoas muscle which appears to extend from the L3-4 or L4-5 vacuum discs. No definite LEFT psoas focal collection or enhancement is identified. IMPRESSION: 1. New foci of gas within the LEFT psoas muscle which appears  to extend from L3-4 or L4-5 degenerative vacuum discs. Although this may represent noninfectious extension of gas from the disc space, infection is not excluded although there is no definite evidence of focal collection/enhancement of the LEFT psoas muscle. Recommend MRI with and without contrast for further evaluation as clinically indicated. 2. Mildly nodular hepatic contour suspicious for cirrhosis. 3. Aortic Atherosclerosis (ICD10-I70.0). Electronically Signed   By: Margarette Canada M.D.   On: 09/15/2021 12:46     Scheduled Meds:  atorvastatin  20 mg Oral q1800   enoxaparin (LOVENOX) injection  40 mg Subcutaneous Q24H   metoprolol succinate  25 mg Oral Daily   pantoprazole  40 mg Oral BID   predniSONE  9 mg Oral Q breakfast   tapentadol  200 mg Oral Q12H   Continuous Infusions:  sodium chloride 50 mL/hr at 09/16/21 0727     LOS: 0 days    Roxan Hockey M.D on 09/16/2021 at 3:58 PM  Go to [email or web address] - for contact info  [facility] - Office  [phone]  If 7PM-7AM, please contact night-coverage [email or web address] Password [facility] 09/16/2021, 3:58 PM

## 2021-09-16 NOTE — TOC Initial Note (Signed)
Transition of Care [facility]) - Initial/Assessment Note    Patient Details  Name: Nicole [name] MRN: [id number] Date of Birth: 1946/08/01  Transition of Care [facility]) CM/SW Contact:    Salome Arnt, [place] Phone Number: 09/16/2021, 11:26 AM  Clinical Narrative:  Pt admitted due to acute kidney injury. Pt has been a resident at [facility] since Monday. She discharged from [facility] to [facility]. LCSW completed assessment with pt. She indicates everything was going okay so far at ALF and plans to return when medically stable. Pt uses wheelchair. Per Colbert, lead supervisor-in-charge at [facility], pt is okay to return. Aware of possible weekend d/c. Pt was active with in house PT, but hadn't started yet due to recent admission. Jasmine indicates nothing is needed for pt to have PT upon return. TOC will continue to follow.                 Expected Discharge Plan: Assisted Living Barriers to Discharge: Continued Medical Work up   Patient Goals and CMS Choice Patient states their goals for this hospitalization and ongoing recovery are:: return to ALF   Choice offered to / list presented to : Patient  Expected Discharge Plan and Services Expected Discharge Plan: Assisted Living In-house Referral: Clinical Social Work   Post Acute Care Choice: Resumption of Svcs/PTA Provider Living arrangements for the past 2 months: [place]                 DME Arranged: N/A         HH Arranged: PT   Date [place]: 09/16/21   Representative spoke with at [place]: Delana Meyer- [employer] systems engineer  Prior Living Arrangements/Services Living arrangements for the past 2 months: [place] Lives with:: Facility Resident Patient language and need for interpreter reviewed:: Yes Do you feel safe going back to the place where you live?: Yes      Need for Family Participation in Patient Care: No (Comment) Care giver support system in place?: Yes  (comment) Current home services: DME (wheelchair) Criminal Activity/Legal Involvement Pertinent to Current Situation/Hospitalization: No - Comment as needed  Activities of Daily Living Home Assistive Devices/Equipment: Wheelchair ADL Screening (condition at time of admission) Patient's cognitive ability adequate to safely complete daily activities?: Yes Is the patient deaf or have difficulty hearing?: No Does the patient have difficulty seeing, even when wearing glasses/contacts?: No Does the patient have difficulty concentrating, remembering, or making decisions?: No Patient able to express need for assistance with ADLs?: Yes Does the patient have difficulty dressing or bathing?: Yes Independently performs ADLs?: No Communication: Needs assistance Is this a change from baseline?: Pre-admission baseline Dressing (OT): Needs assistance Is this a change from baseline?: Pre-admission baseline Grooming: Needs assistance Is this a change from baseline?: Pre-admission baseline Feeding: Independent Bathing: Needs assistance Is this a change from baseline?: Pre-admission baseline Toileting: Needs assistance Is this a change from baseline?: Pre-admission baseline In/Out Bed: Needs assistance Is this a change from baseline?: Pre-admission baseline Walks in Home: Dependent Is this a change from baseline?: Pre-admission baseline Does the patient have difficulty walking or climbing stairs?: Yes Weakness of Legs: Both Weakness of Arms/Hands: Both  Permission Sought/Granted         Permission granted to share info w AGENCY: [facility]  Permission granted to share info w Relationship: ALF     Emotional Assessment     Affect (typically observed): Appropriate Orientation: : Oriented to Self, Oriented to Place, Oriented  to  Time, Oriented to Situation Alcohol / Substance Use: Not Applicable Psych Involvement: No (comment)  Admission diagnosis:  Hypokalemia [E87.6] Hypomagnesemia  [E83.42] Enteritis [K52.9] AKI (acute kidney injury) ([place]) [N17.9] Patient Active Problem List   Diagnosis Date Noted   AKI (acute kidney injury) ([place]) 82/95/6213   Acute metabolic encephalopathy 08/65/7846   Hypokalemia 07/20/2021   Elevated MCV 07/20/2021   Mixed hyperlipidemia 07/20/2021   Diverticulitis 05/02/2018   Diverticulitis of colon 05/01/2018   Morbid obesity with BMI of 50.0-59.9, adult ([place]) 05/01/2018   Chronic diastolic HF (heart failure) ([place]) 05/01/2018   On prednisone therapy 05/31/2017   Positive anti-CCP test 05/31/2017   Rheumatoid factor positive 05/31/2017   Memory loss 04/04/2017   Polymyalgia rheumatica ([place]) 04/04/2017   High risk medication use 12/07/2016   DDD (degenerative disc disease), lumbar 09/27/2016   HTN (hypertension) 06/05/2016   Chronic pain syndrome 06/05/2016   Acute diverticulitis 06/02/2016   Diverticulitis large intestine 06/02/2016   Spinal stenosis of lumbosacral region 02/29/2016   Joint pain 02/29/2016   Endometrial polyp 02/06/2014   Postmenopausal bleeding 01/30/2014   GERD (gastroesophageal reflux disease) 09/29/2013   Spinal stenosis, thoracic 09/29/2013   Shingles 09/29/2013   Thoracic or lumbosacral neuritis or radiculitis, unspecified 05/04/2011   Abnormality of gait 05/04/2011   Muscle weakness (generalized) 05/04/2011   Bilateral primary osteoarthritis of knee 04/07/2009   KNEE PAIN 04/07/2009   PCP:  Jake Samples, PA-C Pharmacy:   [facility], [address] [place] [address] Phone: [phone] Fax: [phone]     Social Determinants of Health (SDOH) Interventions    Readmission Risk Interventions No flowsheet data found.

## 2021-09-16 NOTE — Evaluation (Addendum)
Physical Therapy Evaluation Patient Details Name: Nicole [name] MRN: [id number] DOB: Apr 04, 1946 Today's Date: 09/16/2021  History of Present Illness  Patient was sent from ALF for evaluation for nausea, vomiting and diarrhea for 2 days. in ED she was afebrile, but her work-up significant for severe hypokalemia at 2.3, magnesium of 1.4, and AKI with creatinine of 1.49, she has CT abdomen pelvis to evaluate for her nausea/vomiting/diarrhea, with no significant GI findings, but was significant for few gas foci in left psoas muscle most likely related to severe vacuum disc arthritis.   Clinical Impression  Patient presents in bed awake, alert, and agreeable for therapy in pain w/ nurse at bedside. Patient functioning near baseline for functional mobility and gait other than generalized weakness and c/o back and abdominal pain that is limiting her mobility. Patient transferred from bed to chair using a RW, demonstrated a forward leaning posture and c/o abdominal and back pain, required Min assist, limited to a few steps due to pain, weakness, and fatigue (pain>weakness and fatigue). Patient discharged to care of nursing for ambulation daily as tolerated for length of stay.      Recommendations for follow up therapy are one component of a multi-disciplinary discharge planning process, led by the attending physician.  Recommendations may be updated based on patient status, additional functional criteria and insurance authorization.  Follow Up Recommendations Home health PT    Assistance Recommended at Discharge Intermittent Supervision/Assistance  Functional Status Assessment Patient has had a recent decline in their functional status and demonstrates the ability to make significant improvements in function in a reasonable and predictable amount of time.  Equipment Recommendations  None recommended by PT    Recommendations for Other Services       Precautions / Restrictions Precautions Precautions:  Fall Restrictions Weight Bearing Restrictions: No      Mobility  Bed Mobility Overal bed mobility: Needs Assistance Bed Mobility: Supine to Sit;Sit to Supine     Supine to sit: Mod assist Sit to supine: Mod assist   General bed mobility comments: HOB flat. Patient demonstrated slow labored movments, c/o pain in low back and abdomen, used log roll method    Transfers Overall transfer level: Needs assistance Equipment used: Rolling walker (2 wheels) Transfers: Sit to/from Stand;Bed to chair/wheelchair/BSC Sit to Stand: Min assist   Step pivot transfers: Min assist       General transfer comment: Slow labored movement, c/o pain in low back and abdomen    Ambulation/Gait Ambulation/Gait assistance: Min assist   Assistive device: Rolling walker (2 wheels) Gait Pattern/deviations: Decreased step length - right;Decreased step length - left;Decreased stride length Gait velocity: Decreased     General Gait Details: Slow movement, increased time, forward leaning in in standing position due to pain, c/o pain in low back and abdomen  Stairs            Wheelchair Mobility    Modified Rankin (Stroke Patients Only)       Balance Overall balance assessment: Needs assistance Sitting-balance support: Bilateral upper extremity supported;Feet supported Sitting balance-Leahy Scale: Fair Sitting balance - Comments: seated at EOB Postural control: Other (comment) (Forward lean due to back pain) Standing balance support: Bilateral upper extremity supported;During functional activity;Reliant on assistive device for balance Standing balance-Leahy Scale: Fair Standing balance comment: using RW for bed to chair transfer                             Pertinent  Vitals/Pain Pain Assessment: Faces Faces Pain Scale: Hurts whole lot Pain Location: back and abdomen Pain Descriptors / Indicators: Grimacing;Guarding;Moaning Pain Intervention(s): Limited activity within  patient's tolerance;Monitored during session;Repositioned    Home Living Family/patient expects to be discharged to:: Assisted living                 Home Equipment: BSC/3in1;Wheelchair - manual;Rollator (4 wheels) Additional Comments: Information taken from chart    Prior Function Prior Level of Function : Needs assist       Physical Assist : Mobility (physical);ADLs (physical) Mobility (physical): Bed mobility;Transfers ADLs (physical): Grooming;Bathing;Dressing;Toileting;IADLs Mobility Comments: Pt is wheelchair bound at baseline, uses RW for bathroom accessibility and transfers to wheelchair/bed. ADLs Comments: ALF staff assist with all ADLs     Hand Dominance   Dominant Hand: Right    Extremity/Trunk Assessment   Upper Extremity Assessment Upper Extremity Assessment: Defer to OT evaluation    Lower Extremity Assessment Lower Extremity Assessment: Generalized weakness    Cervical / Trunk Assessment Cervical / Trunk Assessment: Kyphotic  Communication   Communication: No difficulties  Cognition Arousal/Alertness: Awake/alert Behavior During Therapy: WFL for tasks assessed/performed Overall Cognitive Status: Within Functional Limits for tasks assessed                                          General Comments      Exercises     Assessment/Plan    PT Assessment Patient needs continued PT services  PT Problem List Decreased strength;Decreased mobility;Decreased safety awareness;Decreased range of motion;Decreased coordination;Decreased activity tolerance;Decreased balance;Decreased knowledge of use of DME;Pain       PT Treatment Interventions DME instruction;Gait training;Functional mobility training;Therapeutic activities;Therapeutic exercise;Balance training;Patient/family education;Wheelchair mobility training    PT Goals (Current goals can be found in the Care Plan section)  Acute Rehab PT Goals Patient Stated Goal: Return to  ALF PT Goal Formulation: With patient Time For Goal Achievement: 09/20/21 Potential to Achieve Goals: Good    Frequency Min 3X/week   Barriers to discharge        Co-evaluation PT/OT/SLP Co-Evaluation/Treatment: Yes Reason for Co-Treatment: To address functional/ADL transfers PT goals addressed during session: Mobility/safety with mobility OT goals addressed during session: ADL's and self-care;Proper use of Adaptive equipment and DME       AM-PAC PT "6 Clicks" Mobility  Outcome Measure Help needed turning from your back to your side while in a flat bed without using bedrails?: A Little Help needed moving from lying on your back to sitting on the side of a flat bed without using bedrails?: A Little Help needed moving to and from a bed to a chair (including a wheelchair)?: A Little Help needed standing up from a chair using your arms (e.g., wheelchair or bedside chair)?: A Little Help needed to walk in hospital room?: A Little Help needed climbing 3-5 steps with a railing? : A Lot 6 Click Score: 17    End of Session   Activity Tolerance: Patient tolerated treatment well;Patient limited by pain;Patient limited by fatigue Patient left: in bed;with call bell/phone within reach Nurse Communication: Mobility status PT Visit Diagnosis: Unsteadiness on feet (R26.81);Other abnormalities of gait and mobility (R26.89);Muscle weakness (generalized) (M62.81)    Time: 7616-0737 PT Time Calculation (min) (ACUTE ONLY): 30 min   Charges:   PT Evaluation $PT Eval Moderate Complexity: 1 Mod PT Treatments $Therapeutic Activity: 23-37 mins  John Giovanni, SPT  During this treatment session, the therapist was present, participating in and directing the treatment.  12:22 PM, 09/16/21 Lonell Grandchild, MPT Physical Therapist with [facility] [phone] office [phone] mobile phone

## 2021-09-16 NOTE — Plan of Care (Signed)
  Problem: Acute Rehab PT Goals(only PT should resolve) Goal: Pt will Roll Supine to Side Outcome: Progressing Goal: Pt Will Go Supine/Side To Sit Outcome: Progressing Flowsheets (Taken 09/16/2021 1223) Pt will go Supine/Side to Sit: with supervision Goal: Pt Will Go Sit To Supine/Side Outcome: Progressing Flowsheets (Taken 09/16/2021 1223) Pt will go Sit to Supine/Side: with supervision Goal: Patient Will Transfer Sit To/From Stand Outcome: Progressing [place] (Taken 09/16/2021 1223) Patient will transfer sit to/from stand:  with supervision  with min guard assist Goal: Pt Will Transfer Bed To Chair/Chair To Bed Outcome: Progressing Flowsheets (Taken 09/16/2021 1223) Pt will Transfer Bed to Chair/Chair to Bed:  with supervision  min guard assist Goal: Pt Will Ambulate Outcome: Progressing Flowsheets (Taken 09/16/2021 1223) Pt will Ambulate:  10 feet  with min guard assist  with minimal assist  with rolling walker   12:23 PM, 09/16/21 Lonell Grandchild, MPT Physical Therapist with [facility] [phone] office [phone] mobile phone

## 2021-09-16 NOTE — Progress Notes (Signed)
Pharmacy Antibiotic Note  Aprile Dickenson is a 75 y.o. female admitted on 09/15/2021 with  abscess .  Pharmacy has been consulted for Vancomycin dosing.  Plan: Vancomycin 2000 mg IV x 1 dose. Vancomycin 1250 mg iV every 48 hours. Monitor labs, c/s, and vanco level as indicated.  Height: 5\' 1"  (154.9 cm) Weight: 108.9 kg (240 lb) IBW/kg (Calculated) : 47.8  Temp (24hrs), Avg:98.3 F (36.8 C), Min:97.9 F (36.6 C), Max:98.9 F (37.2 C)  Recent Labs  Lab 09/15/21 1055 09/15/21 1328 09/15/21 2131 09/16/21 0601  WBC 12.1*  --   --  7.5  CREATININE 1.49*  --  1.53* 1.52*  LATICACIDVEN  --  1.4  --   --     Estimated Creatinine Clearance: 36.4 mL/min (A) (by C-G formula based on SCr of 1.52 mg/dL (H)).    Allergies  Allergen Reactions   Oxycontin [Oxycodone Hcl] Swelling    Pt tolerates hydromorphone.   Sulfa Antibiotics Other (See Comments)    Sores   Tramadol     "Makes me feel like I am falling"   Penicillins Rash   Sulfasalazine Other (See Comments)    Sores    Antimicrobials this admission: Vanco 11/25 >> CTX 11/25 >>  Microbiology results: 11/24 BCx: pending Cdiff: pending  Thank you for allowing pharmacy to be a part of this patient's care.  Ramond Craver 09/16/2021 4:39 PM

## 2021-09-17 DIAGNOSIS — K6812 Psoas muscle abscess: Secondary | ICD-10-CM | POA: Diagnosis not present

## 2021-09-17 LAB — RENAL FUNCTION PANEL
Albumin: 2.9 g/dL — ABNORMAL LOW (ref 3.5–5.0)
Anion gap: 10 (ref 5–15)
BUN: 17 mg/dL (ref 8–23)
CO2: 24 mmol/L (ref 22–32)
Calcium: 8.6 mg/dL — ABNORMAL LOW (ref 8.9–10.3)
Chloride: 106 mmol/L (ref 98–111)
Creatinine, Ser: 1.16 mg/dL — ABNORMAL HIGH (ref 0.44–1.00)
GFR, Estimated: 49 mL/min — ABNORMAL LOW (ref >60)
Glucose, Bld: 86 mg/dL (ref 70–99)
Phosphorus: 2.7 mg/dL (ref 2.5–4.6)
Potassium: 3.2 mmol/L — ABNORMAL LOW (ref 3.5–5.1)
Sodium: 140 mmol/L (ref 135–145)

## 2021-09-17 LAB — C DIFFICILE QUICK SCREEN W PCR REFLEX
C Diff antigen: NEGATIVE
C Diff interpretation: NOT DETECTED
C Diff toxin: NEGATIVE

## 2021-09-17 LAB — MAGNESIUM: Magnesium: 2.2 mg/dL (ref 1.7–2.4)

## 2021-09-17 MED ORDER — DIPHENHYDRAMINE HCL 50 MG/ML IJ SOLN
25.0000 mg | Freq: Every evening | INTRAMUSCULAR | Status: DC | PRN
  Administered 2021-09-17 – 2021-09-18 (×2): 25 mg via INTRAVENOUS
  Filled 2021-09-17 (×2): qty 1

## 2021-09-17 MED ORDER — SODIUM CHLORIDE 0.9 % IV SOLN
2.0000 g | INTRAVENOUS | Status: DC
  Administered 2021-09-17 – 2021-09-18 (×2): 2 g via INTRAVENOUS
  Filled 2021-09-17 (×2): qty 20

## 2021-09-17 MED ORDER — POTASSIUM CHLORIDE CRYS ER 20 MEQ PO TBCR
40.0000 meq | EXTENDED_RELEASE_TABLET | ORAL | Status: AC
  Administered 2021-09-17 (×2): 40 meq via ORAL
  Filled 2021-09-17 (×2): qty 2

## 2021-09-17 MED ORDER — VANCOMYCIN HCL 1500 MG/300ML IV SOLN
1500.0000 mg | INTRAVENOUS | Status: DC
  Administered 2021-09-18: 11:00:00 1500 mg via INTRAVENOUS
  Filled 2021-09-17: qty 300

## 2021-09-17 NOTE — Progress Notes (Signed)
PROGRESS NOTE     Nicole [name], is a 75 y.o. female, DOB - 11/13/1945, MRN:[id number]  Admit date - 09/15/2021   Admitting Physician Courage Emokpae, MD  Outpatient Primary MD for the patient is Jackson, Samantha J, PA-C  LOS - 1  Chief Complaint  Patient presents with   Emesis        Brief Narrative:  75 y.o. female, with medical history significant for chronic pain syndrome, hypertension, hyperlipidemia, GERD--- admitted with Lt Psoas Muscle Abscess and N/V/D on 09/15/21  Assessment & Plan:   Principal Problem:   Psoas abscess, left (2.0 cm on MRI)  Active Problems:   HTN (hypertension)   Chronic diastolic HF (heart failure) (HCC)   Myositis   GERD (gastroesophageal reflux disease)   Hypokalemia   AKI (acute kidney injury) (HCC)   1)Lt Psoas Muscle Abscess--- MRI of the pelvis and lumbar spine reveals 2.0 cm fluid collection in the left psoas muscle at the L5 level concerning for abscess with a possible additional smaller abscess at the L4 level. -Discussed with general surgeon Dr. Jenkins abscess is too small for drainage -Treat empirically with Vanco and Rocephin WBC 12.1 >> 7.5 ESR 97 Fevers are Resolved -Recheck CRP, ESR and CBC in a.m.  2)HTN--continue metoprolol  3) chronic pain syndrome--- continue Nucynta, may use IV fentanyl as needed breakthrough pain  4)HLD--continue Lipitor  5) hypokalemia/hypomagnesemia----presumably due to diarrhea replace and recheck  6)HFpEF--chronic diastolic dysfunction CHF, Lasix and metolazone on hold due to vomiting and diarrhea -Continue Toprol-XL  7)Generalized weakness/Debility/deconditioning/left foot drop---  --PT eval appreciated recommends return to ALF with home health PT  8)Possible Liver Cirrhosis--on CT abd/pelvis , liver ultrasound also suspicious for liver cirrhosis -Outpatient GI work-up advised -Denies significant EtOH use -Check Viral hepatitis profile and lipid profile in a.m.  9)Social/Ethics---  from ALF at [facility], recently dced from SNF ,    10) vomiting and diarrhea--- CT abdomen and pelvis without acute bowel findings, -Stool for C. difficile is negative -GI symptoms improving  Disposition/Need for in-Hospital Stay- patient unable to be discharged at this time due to left psoas abscess requiring IV antibiotics -Possible discharge to ALF with HH PT in 1 to 2 days if continues to improve  Discussed with Son -Carlos Status is: Inpatient  Remains inpatient appropriate because: Please see disposition above  Disposition: The patient is from: ALF              Anticipated d/c is to: ALF              Anticipated d/c date is: 2 days              Patient currently is not medically stable to d/c. Barriers: Not Clinically Stable-   Code Status :  -  Code Status: Full Code   Family Communication:   (patient is alert, awake and coherent)  Discussed with son Carlos Consults  :    DVT Prophylaxis  :   - SCDs   enoxaparin (LOVENOX) injection 40 mg Start: 09/15/21 1800 SCDs Start: 09/15/21 1624    Lab Results  Component Value Date   PLT 371 09/16/2021    Inpatient Medications  Scheduled Meds:  atorvastatin  20 mg Oral q1800   enoxaparin (LOVENOX) injection  40 mg Subcutaneous Q24H   metoprolol succinate  25 mg Oral Daily   pantoprazole  40 mg Oral BID   predniSONE  9 mg Oral Q breakfast   tapentadol  200 mg Oral Q12H     Continuous Infusions:  sodium chloride 50 mL/hr at 09/17/21 0216   cefTRIAXone (ROCEPHIN)  IV 200 mL/hr at 09/17/21 1348   [START ON 09/18/2021] vancomycin     PRN Meds:.acetaminophen, cyclobenzaprine, fentaNYL (SUBLIMAZE) injection, HYDROcodone-acetaminophen, ondansetron (ZOFRAN) IV   Anti-infectives (From admission, onward)    Start     Dose/Rate Route Frequency Ordered Stop   09/18/21 1200  vancomycin (VANCOREADY) IVPB 1250 mg/250 mL  Status:  Discontinued        1,250 mg 166.7 mL/hr over 90 Minutes Intravenous Every 48 hours 09/16/21 1638  09/17/21 0844   09/18/21 1200  vancomycin (VANCOREADY) IVPB 1500 mg/300 mL        1,500 mg 150 mL/hr over 120 Minutes Intravenous Every 48 hours 09/17/21 0844     09/17/21 1500  cefTRIAXone (ROCEPHIN) 2 g in sodium chloride 0.9 % 100 mL IVPB        2 g 200 mL/hr over 30 Minutes Intravenous Every 24 hours 09/17/21 0808     09/16/21 1700  cefTRIAXone (ROCEPHIN) 1 g in sodium chloride 0.9 % 100 mL IVPB  Status:  Discontinued        1 g 200 mL/hr over 30 Minutes Intravenous Every 24 hours 09/16/21 1601 09/17/21 0808   09/16/21 1700  vancomycin (VANCOREADY) IVPB 2000 mg/400 mL        2,000 mg 200 mL/hr over 120 Minutes Intravenous  Once 09/16/21 1609 09/16/21 1934         Subjective: Nicole [name] today has no fevers, no further emesis,  No chest pain,   -Complains of bilateral thigh and psoas muscle area discomfort -abdominal discomfort improving  Objective: Vitals:   09/16/21 2116 09/17/21 0449 09/17/21 0754 09/17/21 1327  BP: 127/76 139/74 130/76 127/74  Pulse: 86 96 94 86  Resp: 19 19    Temp: 98.2 F (36.8 C) 98 F (36.7 C) 98.1 F (36.7 C) 98.1 F (36.7 C)  TempSrc:  Oral Oral Oral  SpO2: 92% 93% 93% 93%  Weight:      Height:        Intake/Output Summary (Last 24 hours) at 09/17/2021 1617 Last data filed at 09/17/2021 1348 Gross per 24 hour  Intake 1430.71 ml  Output 900 ml  Net 530.71 ml   Filed Weights   09/15/21 1039  Weight: 108.9 kg   Physical Exam  Gen:- Awake Alert, morbidly obese HEENT:- [place].AT, No sclera icterus Neck-Supple Neck,No JVD,.  Lungs-  CTAB , fair symmetrical air movement CV- S1, S2 normal, regular  Abd-  +ve B.Sounds, Abd Soft, No tenderness, increased truncal adiposity Extremity/Skin: pedal pulses present  Psych-affect is appropriate, oriented x3 Neuro-generalized weakness, no new focal deficits, no tremors MSK-bilateral thigh pain left more than right, overlying skin without erythema or streaking  Data Reviewed: I have personally  reviewed following labs and imaging studies  CBC: Recent Labs  Lab 09/15/21 1055 09/16/21 0601  WBC 12.1* 7.5  NEUTROABS 8.2*  --   HGB 14.1 12.2  HCT 40.9 36.5  MCV 92.5 95.8  PLT 414* 371   Basic Metabolic Panel: Recent Labs  Lab 09/15/21 1055 09/15/21 1225 09/15/21 2131 09/16/21 0601 09/17/21 0534  NA 138  --  135 139 140  K 2.3*  --  3.3* 3.1* 3.2*  CL 94*  --  98 100 106  CO2 27  --  22 25 24  GLUCOSE 145*  --  118* 88 86  BUN 24*  --  23 23 17  CREATININE 1.49*  --    1.53* 1.52* 1.16*  CALCIUM 9.1  --  8.4* 8.3* 8.6*  MG  --  1.4*  --   --  2.2  PHOS  --   --   --   --  2.7   GFR: Estimated Creatinine Clearance: 47.8 mL/min (A) (by C-G formula based on SCr of 1.16 mg/dL (H)). Liver Function Tests: Recent Labs  Lab 09/15/21 1055 09/17/21 0534  AST 30  --   ALT 21  --   ALKPHOS 107  --   BILITOT 0.8  --   PROT 7.8  --   ALBUMIN 3.5 2.9*   Recent Labs  Lab 09/15/21 1055  LIPASE 34   No results for input(s): AMMONIA in the last 168 hours. Coagulation Profile: No results for input(s): INR, PROTIME in the last 168 hours. Cardiac Enzymes: No results for input(s): CKTOTAL, CKMB, CKMBINDEX, TROPONINI in the last 168 hours. BNP (last 3 results) No results for input(s): PROBNP in the last 8760 hours. HbA1C: No results for input(s): HGBA1C in the last 72 hours. CBG: No results for input(s): GLUCAP in the last 168 hours. Lipid Profile: No results for input(s): CHOL, HDL, LDLCALC, TRIG, CHOLHDL, LDLDIRECT in the last 72 hours. Thyroid Function Tests: No results for input(s): TSH, T4TOTAL, FREET4, T3FREE, THYROIDAB in the last 72 hours. Anemia Panel: No results for input(s): VITAMINB12, FOLATE, FERRITIN, TIBC, IRON, RETICCTPCT in the last 72 hours. Urine analysis:    Component Value Date/Time   COLORURINE YELLOW 09/15/2021 1348   APPEARANCEUR CLEAR 09/15/2021 1348   LABSPEC <1.005 (L) 09/15/2021 1348   PHURINE 7.0 09/15/2021 1348   GLUCOSEU NEGATIVE  09/15/2021 1348   HGBUR NEGATIVE 09/15/2021 1348   BILIRUBINUR NEGATIVE 09/15/2021 1348   BILIRUBINUR negative 02/14/2020 1328   KETONESUR NEGATIVE 09/15/2021 1348   PROTEINUR NEGATIVE 09/15/2021 1348   UROBILINOGEN 0.2 02/14/2020 1328   UROBILINOGEN 0.2 02/20/2014 1116   NITRITE NEGATIVE 09/15/2021 1348   LEUKOCYTESUR TRACE (A) 09/15/2021 1348   Sepsis Labs: @LABRCNTIP(procalcitonin:4,lacticidven:4)  ) Recent Results (from the past 240 hour(s))  Resp Panel by RT-PCR (Flu A&B, Covid) Nasopharyngeal Swab     Status: None   Collection Time: 09/15/21 11:08 AM   Specimen: Nasopharyngeal Swab; Nasopharyngeal(NP) swabs in vial transport medium  Result Value Ref Range Status   SARS Coronavirus 2 by RT PCR NEGATIVE NEGATIVE Final    Comment: (NOTE) SARS-CoV-2 target nucleic acids are NOT DETECTED.  The SARS-CoV-2 RNA is generally detectable in upper respiratory specimens during the acute phase of infection. The lowest concentration of SARS-CoV-2 viral copies this assay can detect is 138 copies/mL. A negative result does not preclude SARS-Cov-2 infection and should not be used as the sole basis for treatment or other patient management decisions. A negative result may occur with  improper specimen collection/handling, submission of specimen other than nasopharyngeal swab, presence of viral mutation(s) within the areas targeted by this assay, and inadequate number of viral copies(<138 copies/mL). A negative result must be combined with clinical observations, patient history, and epidemiological information. The expected result is Negative.  Fact Sheet for Patients:  https://www.fda.gov/media/152166/download  Fact Sheet for Healthcare Providers:  https://www.fda.gov/media/152162/download  This test is no t yet approved or cleared by the [place] FDA and  has been authorized for detection and/or diagnosis of SARS-CoV-2 by FDA under an Emergency Use Authorization (EUA). This EUA  will remain  in effect (meaning this test can be used) for the duration of the COVID-19 declaration under Section 564(b)(1) of the Act, 21   U.S.C.section 360bbb-3(b)(1), unless the authorization is terminated  or revoked sooner.       Influenza A by PCR NEGATIVE NEGATIVE Final   Influenza B by PCR NEGATIVE NEGATIVE Final    Comment: (NOTE) The Xpert Xpress SARS-CoV-2/FLU/RSV plus assay is intended as an aid in the diagnosis of influenza from Nasopharyngeal swab specimens and should not be used as a sole basis for treatment. Nasal washings and aspirates are unacceptable for Xpert Xpress SARS-CoV-2/FLU/RSV testing.  Fact Sheet for Patients: https://www.fda.gov/media/152166/download  Fact Sheet for Healthcare Providers: https://www.fda.gov/media/152162/download  This test is not yet approved or cleared by the [place] FDA and has been authorized for detection and/or diagnosis of SARS-CoV-2 by FDA under an Emergency Use Authorization (EUA). This EUA will remain in effect (meaning this test can be used) for the duration of the COVID-19 declaration under Section 564(b)(1) of the Act, 21 U.S.C. section 360bbb-3(b)(1), unless the authorization is terminated or revoked.  Performed at [facility], [address] [place], [place] [address]   Culture, blood (routine x 2)     Status: None (Preliminary result)   Collection Time: 09/15/21  1:28 PM   Specimen: BLOOD RIGHT HAND  Result Value Ref Range Status   Specimen Description BLOOD RIGHT HAND  Final   Special Requests   Final    Blood Culture adequate volume BOTTLES DRAWN AEROBIC AND ANAEROBIC   Culture   Final    NO GROWTH 2 DAYS Performed at [facility], [address] [place], [place] [address]    Report Status PENDING  Incomplete  Culture, blood (routine x 2)     Status: None (Preliminary result)   Collection Time: 09/15/21  1:28 PM   Specimen: BLOOD LEFT ARM  Result Value Ref Range Status   Specimen Description BLOOD  LEFT ARM  Final   Special Requests   Final    Blood Culture adequate volume BOTTLES DRAWN AEROBIC AND ANAEROBIC   Culture   Final    NO GROWTH 2 DAYS Performed at [facility], [address] [place], [place] [address]    Report Status PENDING  Incomplete  C Difficile Quick Screen w PCR reflex     Status: None   Collection Time: 09/16/21 12:57 PM   Specimen: STOOL  Result Value Ref Range Status   C Diff antigen NEGATIVE NEGATIVE Final   C Diff toxin NEGATIVE NEGATIVE Final   C Diff interpretation No C. difficile detected.  Final    Comment: Performed at [facility], [address] [place], [place] [address]      Radiology Studies: MR LUMBAR SPINE WO CONTRAST  Result Date: 09/16/2021 CLINICAL DATA:  Back pain common gas seen in psoas muscle on recent CT EXAM: MRI LUMBAR SPINE WITHOUT CONTRAST TECHNIQUE: Multiplanar, multisequence MR imaging of the lumbar spine was performed. No intravenous contrast was administered. COMPARISON:  CT abdomen/pelvis dated 1 day prior, lumbar spine MRI 06/21/2021 FINDINGS: Segmentation: There is transitional anatomy with lumbarization of the S1 vertebral body. For the purposes of this report, there is a rudimentary S1-S2 disc space. Alignment:  There is trace retrolisthesis of L1 on L2 and L2 on L3. Vertebrae: There are postsurgical changes reflecting posterior decompression from L2 through L5. Vertebral body heights are preserved. There is multilevel degenerative endplate marrow signal abnormality throughout the lumbar spine. There is no suspicious marrow signal abnormality. There is no marrow edema to suggest infection. There is no definite evidence of septic arthritis. Conus medullaris and cauda equina: Conus extends to   the L1-L2 level. Conus and cauda equina appear normal. Paraspinal and other soft tissues: There is a 2.0 cm x 0.8 cm by 1.5 cm fluid collection in the left psoas muscle at the L5 level. There may be an additional smaller abscess measuring up to  0.8 cm more superiorly at the L4 level (7-15, 11-18). The paraspinal soft tissues are otherwise unremarkable. Disc levels: There is multilevel intervertebral disc space narrowing. Marked T2 hypointensity in the anterior aspect of the L3-L4 and L4-L5 disc spaces is consistent with vacuum disc phenomenon. There is advanced multilevel facet arthropathy with prominent effusions at L4-L5 and L5-S1, similar to the prior study. T12-L1: Is a mild disc bulge and ligamentum flavum thickening resulting in mild spinal canal stenosis and mild-to-moderate bilateral neural foraminal stenosis. L1-L2: Status post posterior decompression. There is a prominent diffuse disc bulge, degenerative endplate change, and bilateral facet arthropathy resulting in mild spinal canal stenosis and severe bilateral neural foraminal stenosis, unchanged. L2-L3: Status post posterior decompression. There is a mild disc bulge, degenerative endplate change, and bilateral facet arthropathy resulting in severe bilateral neural foraminal stenosis without significant spinal canal stenosis, unchanged. L3-L4: Status post posterior decompression. There is a prominent diffuse disc bulge, degenerative endplate change, and bilateral facet arthropathy resulting in severe bilateral neural foraminal stenosis without significant spinal canal stenosis, unchanged. L4-L5: Status post posterior decompression. There is a prominent diffuse disc bulge, degenerative endplate change, and bilateral facet arthropathy resulting in mild spinal canal stenosis and severe bilateral neural foraminal stenosis, unchanged. L5-S1: There is a diffuse disc bulge with a left subarticular zone annular fissure, degenerative endplate change, and bilateral facet arthropathy resulting in severe left worse than right neural foraminal stenosis without significant spinal canal stenosis, unchanged. IMPRESSION: 1. Postcontrast images were not obtained as the patient could not complete the study.  Within this confines: 2. 2.0 cm fluid collection in the left psoas muscle at the L5 level concerning for abscess with a possible additional smaller abscess at the L4 level. 3. No abnormal marrow edema to suggest adjacent discitis/osteomyelitis, though as above, postcontrast images were not obtained. 4. Severe multilevel degenerative changes as above, not significantly changed since 06/21/2021. Electronically Signed   By: Peter  Noone M.D.   On: 09/16/2021 14:07   MR PELVIS WO CONTRAST  Result Date: 09/16/2021 CLINICAL DATA:  Intramuscular gas within the left iliopsoas muscle on CT. Severe back pain. EXAM: MRI PELVIS WITHOUT CONTRAST TECHNIQUE: Multiplanar multisequence MR imaging of the pelvis was performed. No intravenous contrast was administered. Patient was unable to continue the examination. No contrast was administered. Lumbar spine findings are dictated separately. COMPARISON:  Abdominopelvic CT 09/15/2021 and 06/21/2021. FINDINGS: Bones/Joint/Cartilage Within the pelvis, there is no evidence of acute fracture or dislocation. There are mild degenerative changes of both hips. No evidence of avascular necrosis or significant hip joint effusion. The sacroiliac joints are intact with mild degenerative changes on the left. There is no sacroiliac joint effusion or suspicious subchondral signal abnormality. There is multilevel lumbar spondylosis status post multilevel posterior decompression. Lumbar spine findings are dictated separately. Ligaments Not relevant for exam/indication. Muscles and Tendons Associated with the gas in the left iliopsoas muscle on recent CT is a small amount of muscular edema as well as a small fluid collection measuring up to 1.8 cm posterior to the muscle at the L5 level. No drainable fluid collection identified. Muscular edema extends into the pelvis without associated focal fluid collection. There is no significant fluid in the iliopsoas bursa. There is   asymmetric fatty atrophy of  the left gluteus, piriformis and hamstring muscles. No acute tendon tear identified. Soft tissues There is a chronic left adnexal mass measuring 3.7 cm on image 29/7. This demonstrates low T1 and T2 signal, corresponding with calcification on CT. This mass appears grossly unchanged from CTs dating back to 08/20/2013. As above, left paraspinal inflammatory changes and gas within the ileus psoas muscle. No other focal fluid collections are identified. IMPRESSION: 1. There is a small amount of muscular edema within the left iliopsoas muscle, associated with a small paraspinal fluid collection at the L5 level. See separate examination of the lumbar spine. 2. No intrapelvic fluid collections. 3. Mild degenerative changes of the hips and sacroiliac joints. 4. Stable partially calcified left adnexal mass from at least 2014, consistent with a benign finding. Electronically Signed   By: William  Veazey M.D.   On: 09/16/2021 13:50   US Abdomen Limited RUQ (LIVER/GB)  Result Date: 09/16/2021 CLINICAL DATA:  Abdomen pain EXAM: ULTRASOUND ABDOMEN LIMITED RIGHT UPPER QUADRANT COMPARISON:  CT 09/15/2021. FINDINGS: Gallbladder: Status post cholecystectomy. Common bile duct: Diameter: 1.8 mm Liver: Slightly nodular hepatic contour. No focal hepatic abnormality. Liver is slightly echogenic. Portal vein is patent on color Doppler imaging with normal direction of blood flow towards the liver. Other: None. IMPRESSION: 1. Status post cholecystectomy without biliary dilatation 2. Slightly echogenic nodular liver suspicious for cirrhosis Electronically Signed   By: Kim  Fujinaga M.D.   On: 09/16/2021 19:53     Scheduled Meds:  atorvastatin  20 mg Oral q1800   enoxaparin (LOVENOX) injection  40 mg Subcutaneous Q24H   metoprolol succinate  25 mg Oral Daily   pantoprazole  40 mg Oral BID   predniSONE  9 mg Oral Q breakfast   tapentadol  200 mg Oral Q12H   Continuous Infusions:  sodium chloride 50 mL/hr at 09/17/21 0216    cefTRIAXone (ROCEPHIN)  IV 200 mL/hr at 09/17/21 1348   [START ON 09/18/2021] vancomycin       LOS: 1 day    Courage Emokpae M.D on 09/17/2021 at 4:17 PM  Go to [email or web address] - for contact info  [facility] - Office  [phone]  If 7PM-7AM, please contact night-coverage [email or web address] Password [email or web address] 09/17/2021, 4:17 PM    

## 2021-09-18 DIAGNOSIS — N179 Acute kidney failure, unspecified: Secondary | ICD-10-CM | POA: Diagnosis not present

## 2021-09-18 DIAGNOSIS — K219 Gastro-esophageal reflux disease without esophagitis: Secondary | ICD-10-CM

## 2021-09-18 DIAGNOSIS — E876 Hypokalemia: Secondary | ICD-10-CM | POA: Diagnosis not present

## 2021-09-18 DIAGNOSIS — K6812 Psoas muscle abscess: Secondary | ICD-10-CM | POA: Diagnosis not present

## 2021-09-18 LAB — HEPATITIS PANEL, ACUTE
HCV Ab: NONREACTIVE
Hep A IgM: NONREACTIVE
Hep B C IgM: NONREACTIVE
Hepatitis B Surface Ag: NONREACTIVE

## 2021-09-18 LAB — LIPID PANEL
Cholesterol: 135 mg/dL (ref 0–200)
HDL: 46 mg/dL (ref >40)
LDL Cholesterol: 55 mg/dL (ref 0–99)
Total CHOL/HDL Ratio: 2.9 RATIO
Triglycerides: 169 mg/dL — ABNORMAL HIGH (ref <150)
VLDL: 34 mg/dL (ref 0–40)

## 2021-09-18 LAB — BASIC METABOLIC PANEL
Anion gap: 8 (ref 5–15)
BUN: 10 mg/dL (ref 8–23)
CO2: 24 mmol/L (ref 22–32)
Calcium: 8.5 mg/dL — ABNORMAL LOW (ref 8.9–10.3)
Chloride: 108 mmol/L (ref 98–111)
Creatinine, Ser: 1.05 mg/dL — ABNORMAL HIGH (ref 0.44–1.00)
GFR, Estimated: 55 mL/min — ABNORMAL LOW (ref >60)
Glucose, Bld: 81 mg/dL (ref 70–99)
Potassium: 3.4 mmol/L — ABNORMAL LOW (ref 3.5–5.1)
Sodium: 140 mmol/L (ref 135–145)

## 2021-09-18 LAB — CBC
HCT: 34.8 % — ABNORMAL LOW (ref 36.0–46.0)
Hemoglobin: 11.1 g/dL — ABNORMAL LOW (ref 12.0–15.0)
MCH: 31.2 pg (ref 26.0–34.0)
MCHC: 31.9 g/dL (ref 30.0–36.0)
MCV: 97.8 fL (ref 80.0–100.0)
Platelets: 328 10*3/uL (ref 150–400)
RBC: 3.56 MIL/uL — ABNORMAL LOW (ref 3.87–5.11)
RDW: 13.3 % (ref 11.5–15.5)
WBC: 7.1 10*3/uL (ref 4.0–10.5)
nRBC: 0 % (ref 0.0–0.2)

## 2021-09-18 LAB — SEDIMENTATION RATE: Sed Rate: 60 mm/hr — ABNORMAL HIGH (ref 0–22)

## 2021-09-18 LAB — C-REACTIVE PROTEIN: CRP: 0.8 mg/dL (ref <1.0)

## 2021-09-18 MED ORDER — POTASSIUM CHLORIDE CRYS ER 20 MEQ PO TBCR
40.0000 meq | EXTENDED_RELEASE_TABLET | Freq: Once | ORAL | Status: AC
  Administered 2021-09-18: 11:00:00 40 meq via ORAL
  Filled 2021-09-18: qty 2

## 2021-09-18 NOTE — Progress Notes (Signed)
PROGRESS NOTE     Nicole [name], is a 75 y.o. female, DOB - Oct 21, 1946, TEK:[id number]  Admit date - 09/15/2021   Admitting Physician Courage Mariea Clonts, MD  Outpatient Primary MD for the patient is [name], PA-C  LOS - 2  Chief Complaint  Patient presents with   Emesis       Brief Narrative:  75 y.o. female, with medical history significant for chronic pain syndrome, hypertension, hyperlipidemia, GERD--- admitted with Lt Psoas Muscle Abscess and N/V/D on 09/15/21  Assessment & Plan:   Principal Problem:   Psoas abscess, left (2.0 cm on MRI)  Active Problems:   GERD (gastroesophageal reflux disease)   HTN (hypertension)   Chronic diastolic HF (heart failure) (HCC)   Hypokalemia   AKI (acute kidney injury) (HCC)   Myositis  1)Lt Psoas Muscle Abscess--- MRI of the pelvis and lumbar spine reveals 2.0 cm fluid collection in the left psoas muscle at the L5 level concerning for abscess with a possible additional smaller abscess at the L4 level. -Discussed with general surgeon Dr. Lovell Sheehan abscess is too small for drainage -Treat empirically with Vanco and Rocephin WBC 12.1 >> 7.5 ESR 97 Fevers are Resolved -ESR is trending down. . .   2)HTN--stable, controlled, continue metoprolol  3) chronic pain syndrome--- continue Nucynta, may use IV fentanyl as needed breakthrough pain  4)HLD--continue Lipitor  5) hypokalemia/hypomagnesemia----repleted.   6)HFpEF--chronic diastolic dysfunction CHF, Lasix and metolazone on hold due to vomiting and diarrhea -Continue Toprol-XL, likely can resume diuretics at discharge 11/28.    7)Generalized weakness/Debility/deconditioning/left foot drop---  --PT eval appreciated recommends return to ALF with home health PT  8)Possible Liver Cirrhosis--on CT abd/pelvis , liver ultrasound also suspicious for liver cirrhosis.  -Outpatient GI work-up advised, suspect steatosis as underlying cause.  -Denies significant EtOH use -Check Viral  hepatitis profile and lipid profile in a.m.  9)Social/Ethics--- from ALF at [facility], recently dced from SNF  10) vomiting and diarrhea--- CT abdomen and pelvis without acute bowel findings, -Stool for C. difficile is negative -GI symptoms improved  Disposition/Need for in-Hospital Stay- patient unable to be discharged at this time due to left psoas abscess requiring IV antibiotics -Possible discharge to ALF with [facility] PT 11/28.   Discussed with Son -[name] Status is: Inpatient  Remains inpatient appropriate because: Please see disposition above  Disposition: The patient is from: ALF              Anticipated d/c is to: ALF              Anticipated d/c date is: 2 days              Patient currently is not medically stable to d/c.  Anticipate DC to ALF 11/28.   Barriers: Not Clinically Stable-   Code Status :  -  Code Status: Full Code   Family Communication:   (patient is alert, awake and coherent)  Discussed with son [name] Consults  :    DVT Prophylaxis  :   - SCDs   enoxaparin (LOVENOX) injection 40 mg Start: 09/15/21 1800 SCDs Start: 09/15/21 1624   Lab Results  Component Value Date   PLT 328 09/18/2021   Inpatient Medications  Scheduled Meds:  atorvastatin  20 mg Oral q1800   enoxaparin (LOVENOX) injection  40 mg Subcutaneous Q24H   metoprolol succinate  25 mg Oral Daily   pantoprazole  40 mg Oral BID   predniSONE  9 mg Oral Q breakfast  tapentadol  200 mg Oral Q12H   Continuous Infusions:  sodium chloride 50 mL/hr at 09/18/21 0716   cefTRIAXone (ROCEPHIN)  IV 2 g (09/18/21 1318)   vancomycin 1,500 mg (09/18/21 1056)   PRN Meds:.acetaminophen, cyclobenzaprine, diphenhydrAMINE, fentaNYL (SUBLIMAZE) injection, HYDROcodone-acetaminophen, ondansetron (ZOFRAN) IV   Anti-infectives (From admission, onward)    Start     Dose/Rate Route Frequency Ordered Stop   09/18/21 1200  vancomycin (VANCOREADY) IVPB 1250 mg/250 mL  Status:  Discontinued        1,250  mg 166.7 mL/hr over 90 Minutes Intravenous Every 48 hours 09/16/21 1638 09/17/21 0844   09/18/21 1200  vancomycin (VANCOREADY) IVPB 1500 mg/300 mL        1,500 mg 150 mL/hr over 120 Minutes Intravenous Every 48 hours 09/17/21 0844     09/17/21 1500  cefTRIAXone (ROCEPHIN) 2 g in sodium chloride 0.9 % 100 mL IVPB        2 g 200 mL/hr over 30 Minutes Intravenous Every 24 hours 09/17/21 0808     09/16/21 1700  cefTRIAXone (ROCEPHIN) 1 g in sodium chloride 0.9 % 100 mL IVPB  Status:  Discontinued        1 g 200 mL/hr over 30 Minutes Intravenous Every 24 hours 09/16/21 1601 09/17/21 0808   09/16/21 1700  vancomycin (VANCOREADY) IVPB 2000 mg/400 mL        2,000 mg 200 mL/hr over 120 Minutes Intravenous  Once 09/16/21 1609 09/16/21 1934         Subjective: Keily Lepp reports she has not been ambulating much lately.  She says that the pain is getting better.    Objective: Vitals:   09/17/21 0754 09/17/21 1327 09/17/21 2121 09/18/21 0338  BP: 130/76 127/74 (!) 142/79 (!) 157/86  Pulse: 94 86 83 100  Resp:   17 16  Temp: 98.1 F (36.7 C) 98.1 F (36.7 C) 97.9 F (36.6 C) 98.6 F (37 C)  TempSrc: Oral Oral Oral Oral  SpO2: 93% 93% 95% 96%  Weight:      Height:        Intake/Output Summary (Last 24 hours) at 09/18/2021 1349 Last data filed at 09/18/2021 0716 Gross per 24 hour  Intake 1604.98 ml  Output --  Net 1604.98 ml   Filed Weights   09/15/21 1039  Weight: 108.9 kg   Physical Exam  Gen:- Awake Alert, morbidly obese, cooperative, NAD.  HEENT:- NCAT, PERRL, nares clear, No sclera icterus Neck- no JVD, no thyromegaly appreciated.   Lungs-  no increased work of breathing.   CV- normal S1, S2 sounds.  No M/R/G.   Abd-  +ve B.Sounds, Abd Soft, No tenderness, increased truncal adiposity Extremity/Skin: pedal pulses present  Psych-affect is appropriate, oriented x3 Neuro-generalized weakness, no new focal deficits, no tremors MSK-bilateral thigh pain left more than  right, overlying skin without erythema or streaking  Data Reviewed: I have personally reviewed following labs and imaging studies  CBC: Recent Labs  Lab 09/15/21 1055 09/16/21 0601 09/18/21 0503  WBC 12.1* 7.5 7.1  NEUTROABS 8.2*  --   --   HGB 14.1 12.2 11.1*  HCT 40.9 36.5 34.8*  MCV 92.5 95.8 97.8  PLT 414* 371 469   Basic Metabolic Panel: Recent Labs  Lab 09/15/21 1055 09/15/21 1225 09/15/21 2131 09/16/21 0601 09/17/21 0534 09/18/21 0503  NA 138  --  135 139 140 140  K 2.3*  --  3.3* 3.1* 3.2* 3.4*  CL 94*  --  98 100 106  108  CO2 27  --  _0 GLUCOSE 145*  --  118* 88 86 81  BUN 24*  --  _1 CREATININE 1.49*  --  1.53* 1.52* 1.16* 1.05*  CALCIUM 9.1  --  8.4* 8.3* 8.6* 8.5*  MG  --  1.4*  --   --  2.2  --   PHOS  --   --   --   --  2.7  --    GFR: Estimated Creatinine Clearance: 52.8 mL/min (A) (by C-G formula based on SCr of 1.05 mg/dL (H)). Liver Function Tests: Recent Labs  Lab 09/15/21 1055 09/17/21 0534  AST 30  --   ALT 21  --   ALKPHOS 107  --   BILITOT 0.8  --   PROT 7.8  --   ALBUMIN 3.5 2.9*   Recent Labs  Lab 09/15/21 1055  LIPASE 34   No results for input(s): AMMONIA in the last 168 hours. Coagulation Profile: No results for input(s): INR, PROTIME in the last 168 hours. Cardiac Enzymes: No results for input(s): CKTOTAL, CKMB, CKMBINDEX, TROPONINI in the last 168 hours. BNP (last 3 results) No results for input(s): PROBNP in the last 8760 hours. HbA1C: No results for input(s): HGBA1C in the last 72 hours. CBG: No results for input(s): GLUCAP in the last 168 hours. Lipid Profile: Recent Labs    09/18/21 0503  CHOL 135  HDL 46  LDLCALC 55  TRIG 169*  CHOLHDL 2.9   Thyroid Function Tests: No results for input(s): TSH, T4TOTAL, FREET4, T3FREE, THYROIDAB in the last 72 hours. Anemia Panel: No results for input(s): VITAMINB12, FOLATE, FERRITIN, TIBC, IRON, RETICCTPCT in the last 72 hours. Urine analysis:     Component Value Date/Time   COLORURINE YELLOW 09/15/2021 [place] 09/15/2021 1348   LABSPEC <1.005 (L) 09/15/2021 1348   PHURINE 7.0 09/15/2021 1348   GLUCOSEU NEGATIVE 09/15/2021 1348   HGBUR NEGATIVE 09/15/2021 [place] 09/15/2021 1348   BILIRUBINUR negative 02/14/2020 1328   KETONESUR NEGATIVE 09/15/2021 1348   PROTEINUR NEGATIVE 09/15/2021 1348   UROBILINOGEN 0.2 02/14/2020 1328   UROBILINOGEN 0.2 02/20/2014 1116   NITRITE NEGATIVE 09/15/2021 1348   LEUKOCYTESUR TRACE (A) 09/15/2021 1348    Recent Results (from the past 240 hour(s))  Resp Panel by RT-PCR (Flu A&B, Covid) Nasopharyngeal Swab     Status: None   Collection Time: 09/15/21 11:08 AM   Specimen: Nasopharyngeal Swab; Nasopharyngeal(NP) swabs in vial transport medium  Result Value Ref Range Status   SARS Coronavirus 2 by RT PCR NEGATIVE NEGATIVE Final    Comment: (NOTE) SARS-CoV-2 target nucleic acids are NOT DETECTED.  The SARS-CoV-2 RNA is generally detectable in upper respiratory specimens during the acute phase of infection. The lowest concentration of SARS-CoV-2 viral copies this assay can detect is 138 copies/mL. A negative result does not preclude SARS-Cov-2 infection and should not be used as the sole basis for treatment or other patient management decisions. A negative result may occur with  improper specimen collection/handling, submission of specimen other than nasopharyngeal swab, presence of viral mutation(s) within the areas targeted by this assay, and inadequate number of viral copies(<138 copies/mL). A negative result must be combined with clinical observations, patient history, and epidemiological information. The expected result is Negative.  Fact Sheet for Patients:  EntrepreneurPulse.com.au  Fact Sheet for Healthcare Providers:  IncredibleEmployment.be  This test is no t yet approved or cleared by the  [place] Architectural technologist  and  has been authorized for detection and/or diagnosis of SARS-CoV-2 by FDA under an Print production planner (EUA). This EUA will remain  in effect (meaning this test can be used) for the duration of the COVID-19 declaration under Section 564(b)(1) of the Act, 21 U.S.C.section 360bbb-3(b)(1), unless the authorization is terminated  or revoked sooner.       Influenza A by PCR NEGATIVE NEGATIVE Final   Influenza B by PCR NEGATIVE NEGATIVE Final    Comment: (NOTE) The Xpert Xpress SARS-CoV-2/FLU/RSV plus assay is intended as an aid in the diagnosis of influenza from Nasopharyngeal swab specimens and should not be used as a sole basis for treatment. Nasal washings and aspirates are unacceptable for Xpert Xpress SARS-CoV-2/FLU/RSV testing.  Fact Sheet for Patients: EntrepreneurPulse.com.au  Fact Sheet for Healthcare Providers: IncredibleEmployment.be  This test is not yet approved or cleared by the [place] FDA and has been authorized for detection and/or diagnosis of SARS-CoV-2 by FDA under an Emergency Use Authorization (EUA). This EUA will remain in effect (meaning this test can be used) for the duration of the COVID-19 declaration under Section 564(b)(1) of the Act, 21 U.S.C. section 360bbb-3(b)(1), unless the authorization is terminated or revoked.  Performed at [facility], [address], [place], [place] [address]   Culture, blood (routine x 2)     Status: None (Preliminary result)   Collection Time: 09/15/21  1:28 PM   Specimen: BLOOD RIGHT HAND  Result Value Ref Range Status   Specimen Description BLOOD RIGHT HAND  Final   Special Requests   Final    Blood Culture adequate volume BOTTLES DRAWN AEROBIC AND ANAEROBIC   Culture   Final    NO GROWTH 3 DAYS Performed at [facility], [address] [place], [place] [address]    Report Status PENDING  Incomplete  Culture, blood (routine x 2)     Status: None (Preliminary  result)   Collection Time: 09/15/21  1:28 PM   Specimen: BLOOD LEFT ARM  Result Value Ref Range Status   Specimen Description BLOOD LEFT ARM  Final   Special Requests   Final    Blood Culture adequate volume BOTTLES DRAWN AEROBIC AND ANAEROBIC   Culture   Final    NO GROWTH 3 DAYS Performed at [facility], [address] [place], [place] [address]    Report Status PENDING  Incomplete  C Difficile Quick Screen w PCR reflex     Status: None   Collection Time: 09/16/21 12:57 PM   Specimen: STOOL  Result Value Ref Range Status   C Diff antigen NEGATIVE NEGATIVE Final   C Diff toxin NEGATIVE NEGATIVE Final   C Diff interpretation No C. difficile detected.  Final    Comment: Performed at [facility], [address], [place], [place] [address]     Radiology Studies: US Abdomen Limited RUQ (LIVER/GB)  Result Date: 09/16/2021 CLINICAL DATA:  Abdomen pain EXAM: ULTRASOUND ABDOMEN LIMITED RIGHT UPPER QUADRANT COMPARISON:  CT 09/15/2021. FINDINGS: Gallbladder: Status post cholecystectomy. Common bile duct: Diameter: 1.8 mm Liver: Slightly nodular hepatic contour. No focal hepatic abnormality. Liver is slightly echogenic. Portal vein is patent on color Doppler imaging with normal direction of blood flow towards the liver. Other: None. IMPRESSION: 1. Status post cholecystectomy without biliary dilatation 2. Slightly echogenic nodular liver suspicious for cirrhosis Electronically Signed   By: Donavan Foil M.D.   On: 09/16/2021 19:53     Scheduled Meds:  atorvastatin  20  mg Oral q1800   enoxaparin (LOVENOX) injection  40 mg Subcutaneous Q24H   metoprolol succinate  25 mg Oral Daily   pantoprazole  40 mg Oral BID   predniSONE  9 mg Oral Q breakfast   tapentadol  200 mg Oral Q12H   Continuous Infusions:  sodium chloride 50 mL/hr at 09/18/21 0716   cefTRIAXone (ROCEPHIN)  IV 2 g (09/18/21 1318)   vancomycin 1,500 mg (09/18/21 1056)     LOS: 2 days    Irwin Brakeman M.D on 09/18/2021 at  1:49 PM  Go to [email or web address] - for contact info  How to contact the [facility] Attending or Consulting provider [place] or covering provider during after hours [place], for this patient?  Check the care team in [facility] and look for a) attending/consulting [facility] provider listed and b) the [facility] team listed Log into [email or web address] and use [facility] universal password to access. If you do not have the password, please contact the hospital operator. Locate the [facility] provider you are looking for under [facility] and page to a number that you can be directly reached. If you still have difficulty reaching the provider, please page the [facility] (Director on Call) for the Hospitalists listed on amion for assistance.   If 7PM-7AM, please contact night-coverage [email or web address] Password [email or web address] 09/18/2021, 1:49 PM

## 2021-09-18 NOTE — Progress Notes (Signed)
Patient son [name] is at bedside and states he would like to be contacted prior to any discharge planning tomorrow. He will need to have several things set up and in place and would just like a heads up about what is going on. [name] # [phone]

## 2021-09-19 DIAGNOSIS — N179 Acute kidney failure, unspecified: Secondary | ICD-10-CM | POA: Diagnosis not present

## 2021-09-19 DIAGNOSIS — I5032 Chronic diastolic (congestive) heart failure: Secondary | ICD-10-CM | POA: Diagnosis not present

## 2021-09-19 DIAGNOSIS — K6812 Psoas muscle abscess: Secondary | ICD-10-CM | POA: Diagnosis not present

## 2021-09-19 DIAGNOSIS — E876 Hypokalemia: Secondary | ICD-10-CM | POA: Diagnosis not present

## 2021-09-19 LAB — CREATININE, SERUM
Creatinine, Ser: 0.96 mg/dL (ref 0.44–1.00)
GFR, Estimated: >60 mL/min (ref >60)

## 2021-09-19 MED ORDER — TORSEMIDE 40 MG PO TABS: 40.0000 mg | ORAL_TABLET | Freq: Two times a day (BID) | ORAL | 2 refills | Status: DC

## 2021-09-19 MED ORDER — HYDROCODONE-ACETAMINOPHEN 7.5-325 MG PO TABS: 1.0000 | ORAL_TABLET | Freq: Four times a day (QID) | ORAL | 0 refills | Status: AC | PRN

## 2021-09-19 MED ORDER — LIDOCAINE 5 % EX PTCH
1.0000 | MEDICATED_PATCH | CUTANEOUS | Status: DC
  Filled 2021-09-19: qty 1

## 2021-09-19 MED ORDER — DOXYCYCLINE HYCLATE 100 MG PO CAPS: 100.0000 mg | ORAL_CAPSULE | Freq: Two times a day (BID) | ORAL | 0 refills | Status: DC

## 2021-09-19 MED ORDER — PANTOPRAZOLE SODIUM 40 MG PO TBEC: 40.0000 mg | DELAYED_RELEASE_TABLET | Freq: Every day | ORAL | Status: DC

## 2021-09-19 MED ORDER — ALPRAZOLAM 0.25 MG PO TABS: 0.2500 mg | ORAL_TABLET | Freq: Once | ORAL | Status: DC | PRN

## 2021-09-19 NOTE — NC FL2 (Signed)
[place] LEVEL OF CARE SCREENING TOOL     IDENTIFICATION  Patient Name: Nicole [name] Birthdate: 1945/10/31 Sex: female Admission Date (Current Location): 09/15/2021  [facility] and [place] Number:  [employer] and Address:  [facility] [address] [place]      Provider Number: [id number]  Attending Physician Name and Address:  No att. providers found  Relative Name and Phone Number:  Vonzell Schlatter (Daughter)   [phone]    Current Level of Care: Hospital Recommended Level of Care: Assisted Living Facility Prior Approval Number:    Date Approved/Denied:   PASRR Number:    Discharge Plan: Other (Comment) (ALF)    Current Diagnoses: Patient Active Problem List   Diagnosis Date Noted   Myositis 09/16/2021   Psoas abscess, left (2.0 cm on MRI)  09/16/2021   AKI (acute kidney injury) ([place]) 95/06/3266   Acute metabolic encephalopathy 12/45/8099   Hypokalemia 07/20/2021   Elevated MCV 07/20/2021   Mixed hyperlipidemia 07/20/2021   Diverticulitis 05/02/2018   Diverticulitis of colon 05/01/2018   Morbid obesity with BMI of 50.0-59.9, adult ([place]) 05/01/2018   Chronic diastolic HF (heart failure) ([place]) 05/01/2018   On prednisone therapy 05/31/2017   Positive anti-CCP test 05/31/2017   Rheumatoid factor positive 05/31/2017   Memory loss 04/04/2017   Polymyalgia rheumatica ([place]) 04/04/2017   High risk medication use 12/07/2016   DDD (degenerative disc disease), lumbar 09/27/2016   HTN (hypertension) 06/05/2016   Chronic pain syndrome 06/05/2016   Acute diverticulitis 06/02/2016   Diverticulitis large intestine 06/02/2016   Spinal stenosis of lumbosacral region 02/29/2016   Joint pain 02/29/2016   Endometrial polyp 02/06/2014   Postmenopausal bleeding 01/30/2014   GERD (gastroesophageal reflux disease) 09/29/2013   Spinal stenosis, thoracic 09/29/2013   Shingles 09/29/2013   Thoracic or lumbosacral neuritis or  radiculitis, unspecified 05/04/2011   Abnormality of gait 05/04/2011   Muscle weakness (generalized) 05/04/2011   Bilateral primary osteoarthritis of knee 04/07/2009   KNEE PAIN 04/07/2009    Orientation RESPIRATION BLADDER Height & Weight     Self, Time, Situation, Place  Normal Incontinent Weight: 240 lb (108.9 kg) Height:  5\' 1"  (154.9 cm)  BEHAVIORAL SYMPTOMS/MOOD NEUROLOGICAL BOWEL NUTRITION STATUS      Continent Diet (DIET SOFT Room service appropriate? Yes; Fluid consistency: Thin)  AMBULATORY STATUS COMMUNICATION OF NEEDS Skin   Limited Assist Verbally Other (Comment) (Ecchymosis Abdomen arm)                       Personal Care Assistance Level of Assistance  Bathing, Feeding, Dressing Bathing Assistance: Limited assistance Feeding assistance: Independent Dressing Assistance: Independent     Functional Limitations Info  Sight, Hearing, Speech Sight Info: Impaired Hearing Info: Adequate Speech Info: Adequate    SPECIAL CARE FACTORS FREQUENCY  PT (By licensed PT)     PT Frequency: 3x HHPT              Contractures Contractures Info: Not present    Additional Factors Info  Code Status, Allergies Code Status Info: Full Allergies Info: Oxycontin (oxycodone Hcl)  Sulfa Antibiotics  Tramadol  Penicillins  Sulfasalazine           Current Medications (09/19/2021):  This is the current hospital active medication list Current Facility-Administered Medications  Medication Dose Route Frequency Provider Last Rate Last Admin   acetaminophen (TYLENOL) tablet 650 mg  650 mg Oral Q6H PRN Elgergawy, Silver Huguenin, MD   [phone]  mg at 09/16/21 0429   ALPRAZolam (XANAX) tablet 0.25 mg  0.25 mg Oral Once PRN Wynetta Emery, Clanford L, MD       atorvastatin (LIPITOR) tablet 20 mg  20 mg Oral q1800 Elgergawy, Silver Huguenin, MD   20 mg at 09/18/21 1616   cefTRIAXone (ROCEPHIN) 2 g in sodium chloride 0.9 % 100 mL IVPB  2 g Intravenous Q24H Roxan Hockey, MD   Stopped at 09/18/21 1349    cyclobenzaprine (FLEXERIL) tablet 5 mg  5 mg Oral TID PRN Elgergawy, Silver Huguenin, MD   5 mg at 09/19/21 0824   diphenhydrAMINE (BENADRYL) injection 25 mg  25 mg Intravenous QHS PRN Zierle-Ghosh, Asia B, DO   25 mg at 09/18/21 2056   enoxaparin (LOVENOX) injection 40 mg  40 mg Subcutaneous Q24H Elgergawy, Silver Huguenin, MD   40 mg at 09/18/21 1616   fentaNYL (SUBLIMAZE) injection 25 mcg  25 mcg Intravenous Q4H PRN Roxan Hockey, MD   25 mcg at 09/19/21 1105   HYDROcodone-acetaminophen (NORCO) 7.5-325 MG per tablet 1 tablet  1 tablet Oral Q6H PRN Roxan Hockey, MD   1 tablet at 09/19/21 0824   lidocaine (LIDODERM) 5 % 1 patch  1 patch Transdermal Q24H Johnson, Clanford L, MD       metoprolol succinate (TOPROL-XL) 24 hr tablet 25 mg  25 mg Oral Daily Elgergawy, Silver Huguenin, MD   25 mg at 09/19/21 0824   ondansetron (ZOFRAN) injection 4 mg  4 mg Intravenous Q6H PRN Zierle-Ghosh, Asia B, DO   4 mg at 09/18/21 0539   pantoprazole (PROTONIX) EC tablet 40 mg  40 mg Oral BID Elgergawy, Silver Huguenin, MD   40 mg at 09/19/21 0825   predniSONE (DELTASONE) tablet 9 mg  9 mg Oral Q breakfast Elgergawy, Silver Huguenin, MD   9 mg at 09/19/21 0825   tapentadol (NUCYNTA) 12 hr tablet 200 mg  200 mg Oral Q12H Elgergawy, Silver Huguenin, MD   200 mg at 09/19/21 0825   vancomycin (VANCOREADY) IVPB 1500 mg/300 mL  1,500 mg Intravenous Q48H Emokpae, Courage, MD   Stopped at 09/18/21 1256   Current Outpatient Medications  Medication Sig Dispense Refill   acetaminophen (TYLENOL) 325 MG tablet Take 2 tablets (650 mg total) by mouth every 6 (six) hours as needed for mild pain or headache (or Fever >/= 101).     atorvastatin (LIPITOR) 20 MG tablet Take 20 mg by mouth daily.     calcitRIOL (ROCALTROL) 0.25 MCG capsule Take 0.25 mcg by mouth See admin instructions. Take 0.25 every Mon, Wed, Fri.     doxycycline (VIBRAMYCIN) 100 MG capsule Take 1 capsule (100 mg total) by mouth 2 (two) times daily for 7 days. 14 capsule 0   leflunomide (ARAVA) 10 MG  tablet TAKE (1) TABLET BY MOUTH DAILY. (Patient taking differently: Take 10 mg by mouth daily.) 90 tablet 1   metolazone (ZAROXOLYN) 5 MG tablet Take 5 mg by mouth 2 (two) times a week. Tuesday and Fridays     metoprolol succinate (TOPROL XL) 25 MG 24 hr tablet Take 1 tablet (25 mg total) by mouth daily. 30 tablet 2   Multiple Vitamins-Minerals (MULTIVITAMIN PO) Take 1 tablet by mouth daily.      NUCYNTA ER 200 MG TB12 SMARTSIG:1 Tablet(s) By Mouth Every 12 Hours     potassium chloride (KLOR-CON) 10 MEQ tablet Take 10 mEq by mouth daily.     predniSONE (DELTASONE) 1 MG tablet Take 9 tablets (9 mg total) by  mouth daily with breakfast. 30 tablet 1   Simethicone (GAS-X PO) Take 1 tablet by mouth 3 (three) times daily as needed (flatulence).      cyclobenzaprine (FLEXERIL) 5 MG tablet Take 5 mg by mouth every 12 (twelve) hours as needed for muscle spasms.      diclofenac Sodium (VOLTAREN) 1 % GEL Apply 2 g topically 4 (four) times daily as needed (pain).      HYDROcodone-acetaminophen (NORCO) 7.5-325 MG tablet Take 1 tablet by mouth every 6 (six) hours as needed for up to 3 days for severe pain. 10 tablet 0   lidocaine (LIDODERM) 5 % Place 1-2 patches onto the skin every 12 (twelve) hours as needed (pain).  (Patient not taking: Reported on 09/15/2021)     pantoprazole (PROTONIX) 40 MG tablet Take 1 tablet (40 mg total) by mouth daily.     [START ON 09/21/2021] Torsemide 40 MG TABS Take 40 mg by mouth 2 (two) times daily. 60 tablet 2     Discharge Medications:  STOP taking these medications     diclofenac 75 MG EC tablet Commonly known as: VOLTAREN    furosemide 40 MG tablet Commonly known as: LASIX    gabapentin 400 MG capsule Commonly known as: NEURONTIN    HYDROmorphone 4 MG tablet Commonly known as: DILAUDID    Kevzara 200 MG/1.14ML So[place] Generic drug: Sarilumab    methylPREDNISolone 4 MG Tbpk tablet Commonly known as: MEDROL DOSEPAK    oxyCODONE 5 MG immediate release  tablet Commonly known as: Roxicodone    potassium chloride 10 MEQ tablet Commonly known as: KLOR-CON           TAKE these medications     acetaminophen 325 MG tablet Commonly known as: TYLENOL Take 2 tablets (650 mg total) by mouth every 6 (six) hours as needed for mild pain or headache (or Fever >/= 101).    atorvastatin 20 MG tablet Commonly known as: LIPITOR Take 20 mg by mouth daily.    calcitRIOL 0.25 MCG capsule Commonly known as: ROCALTROL Take 0.25 mcg by mouth See admin instructions. Take 0.25 every Mon, Wed, Fri.    cyclobenzaprine 5 MG tablet Commonly known as: FLEXERIL Take 5 mg by mouth every 12 (twelve) hours as needed for muscle spasms. What changed: Another medication with the same name was removed. Continue taking this medication, and follow the directions you see here.    diclofenac Sodium 1 % Gel Commonly known as: VOLTAREN Apply 2 g topically 4 (four) times daily as needed (pain).    doxycycline 100 MG capsule Commonly known as: VIBRAMYCIN Take 1 capsule (100 mg total) by mouth 2 (two) times daily for 7 days.    GAS-X PO Take 1 tablet by mouth 3 (three) times daily as needed (flatulence).    HYDROcodone-acetaminophen 7.5-325 MG tablet Commonly known as: NORCO Take 1 tablet by mouth every 6 (six) hours as needed for up to 3 days for severe pain.    leflunomide 10 MG tablet Commonly known as: ARAVA TAKE (1) TABLET BY MOUTH DAILY. What changed:  See the new instructions. Another medication with the same name was removed. Continue taking this medication, and follow the directions you see here.    lidocaine 5 % Commonly known as: LIDODERM Place 1-2 patches onto the skin every 12 (twelve) hours as needed (pain). What changed: Another medication with the same name was removed. Continue taking this medication, and follow the directions you see here.    metolazone 5 MG  tablet Commonly known as: ZAROXOLYN Take 5 mg by mouth 2 (two) times a week.  Tuesday and Fridays    metoprolol succinate 25 MG 24 hr tablet Commonly known as: Toprol XL Take 1 tablet (25 mg total) by mouth daily.    MULTIVITAMIN PO Take 1 tablet by mouth daily.    Nucynta ER 200 MG Tb12 Generic drug: Tapentadol HCl SMARTSIG:1 Tablet(s) By Mouth Every 12 Hours    pantoprazole 40 MG tablet Commonly known as: PROTONIX Take 1 tablet (40 mg total) by mouth daily. What changed:  when to take this Another medication with the same name was removed. Continue taking this medication, and follow the directions you see here.    potassium chloride 10 MEQ tablet Commonly known as: KLOR-CON Take 10 mEq by mouth daily.    predniSONE 1 MG tablet Commonly known as: DELTASONE Take 9 tablets (9 mg total) by mouth daily with breakfast.    Torsemide 40 MG Tabs Take 40 mg by mouth 2 (two) times daily. Start taking on: September 21, 2021 What changed: These instructions start on September 21, 2021. If you are unsure what to do until then, ask your doctor or other care provider.    Relevant Imaging Results:  Relevant Lab Results:   Additional Information PT SSN: [id number]  Natasha Bence, LCSW

## 2021-09-19 NOTE — TOC Transition Note (Signed)
Transition of Care [facility]) - CM/SW Discharge Note   Patient Details  Name: Nicole [name] MRN: [id number] Date of Birth: 05/31/1946  [facility]) CM/SW Contact:  Natasha Bence, LCSW Phone Number: 09/19/2021, 12:46 PM   Clinical Narrative:    CSW notified of patient's readiness for discharge. CSW faxed discharge summary and FL2. PT to be provided by ALF. Son agreeable to transport patient. TOC signing off.    Final next level of care: Assisted Living Barriers to Discharge: Barriers Resolved   Patient Goals and CMS Choice Patient states their goals for this hospitalization and ongoing recovery are:: Return to ALF CMS Medicare.gov Compare Post Acute Care list provided to:: Patient Choice offered to / list presented to : Patient  Discharge Placement                Patient to be transferred to facility by: Patient's son Name of family member notified: Marianny, Goris [phone] Patient and family notified of of transfer: 09/19/21  Discharge Plan and Services In-house Referral: Clinical Social Work   Post Acute Care Choice: Resumption of Svcs/PTA Provider          DME Arranged: N/A         HH Arranged: PT   Date [place]: 09/16/21   Representative spoke with at [place]: Delana Meyer- [employer] systems [employer]  Social Determinants of Health ([place]) Interventions     Readmission Risk Interventions No flowsheet data found.

## 2021-09-19 NOTE — Progress Notes (Signed)
Physical Therapy Treatment Patient Details Name: Nicole [name] MRN: [id number] DOB: 09/26/1946 Today's Date: 09/19/2021   History of Present Illness Patient was sent from ALF for evaluation for nausea, vomiting and diarrhea for 2 days. in ED she was afebrile, but her work-up significant for severe hypokalemia at 2.3, magnesium of 1.4, and AKI with creatinine of 1.49, she has CT abdomen pelvis to evaluate for her nausea/vomiting/diarrhea, with no significant GI findings, but was significant for few gas foci in left psoas muscle most likely related to severe vacuum disc arthritis.    PT Comments    Present and agreeable for PT treatment, MD had just finished and discussed d/c this afternoon. Pt states she is willing to do what PT asks, but reports 10/10 pain throughout, with increased grimacing especially in rolling. RN provided pain medication at beginning of PT session. Heavy focus on bed mobility with good independent UE and upper trunk movement, increased difficulty with LE  and pelvis movement requiring MiNA to complete consistently. Decreased LUE movement in UE flexion, but discussed continuation of movement in pain free range. Education on benefits of mobility, strength, and general muscle activation to improve functional mobility and strength. RN notified of mobility status and focus on bed mobility. Patient will benefit from continued skilled physical therapy in hospital and recommended venue below to increase strength, balance, endurance for safe ADLs and gait.   Recommendations for follow up therapy are one component of a multi-disciplinary discharge planning process, led by the attending physician.  Recommendations may be updated based on patient status, additional functional criteria and insurance authorization.  Follow Up Recommendations  Home health PT     Assistance Recommended at Discharge Intermittent Supervision/Assistance  Equipment Recommendations  None recommended by PT     Recommendations for Other Services       Precautions / Restrictions Precautions Precautions: Fall Restrictions Weight Bearing Restrictions: No     Mobility  Bed Mobility Overal bed mobility: Needs Assistance Bed Mobility: Rolling Rolling: Min assist;Mod assist         General bed mobility comments: HOB flat. Patient demonstrated slow labored movments, c/o pain in low back and abdomen, used log roll method. B directions with use of rails, good reaching cross body    Transfers                        Ambulation/Gait                   Stairs             Wheelchair Mobility    Modified Rankin (Stroke Patients Only)       Balance                                            Cognition Arousal/Alertness: Awake/alert Behavior During Therapy: WFL for tasks assessed/performed Overall Cognitive Status: Within Functional Limits for tasks assessed                                          Exercises General Exercises - Upper Extremity Shoulder Flexion: AROM;Strengthening;Both;10 reps;Supine General Exercises - Lower Extremity Ankle Circles/Pumps: AROM;Strengthening;Both;5 reps;Supine Gluteal Sets: Strengthening;Both;10 reps;Supine Hip ABduction/ADduction: AROM;Strengthening;Both;10 reps;Supine    General Comments  Pertinent Vitals/Pain Pain Assessment: 0-10 Pain Score: 10-Worst pain ever Faces Pain Scale: Hurts whole lot Pain Location: back and abdomen Pain Descriptors / Indicators: Grimacing;Guarding;Moaning Pain Intervention(s): Limited activity within patient's tolerance;RN gave pain meds during session;Monitored during session    Home Living                          Prior Function            PT Goals (current goals can now be found in the care plan section) Acute Rehab PT Goals Patient Stated Goal: Return to ALF PT Goal Formulation: With patient Time For Goal Achievement:  09/20/21 Potential to Achieve Goals: Good Progress towards PT goals: Progressing toward goals    Frequency    Min 3X/week      PT Plan Current plan remains appropriate    Co-evaluation              AM-PAC PT "6 Clicks" Mobility   Outcome Measure  Help needed turning from your back to your side while in a flat bed without using bedrails?: A Little Help needed moving from lying on your back to sitting on the side of a flat bed without using bedrails?: A Lot Help needed moving to and from a bed to a chair (including a wheelchair)?: A Little Help needed standing up from a chair using your arms (e.g., wheelchair or bedside chair)?: A Little Help needed to walk in hospital room?: A Little Help needed climbing 3-5 steps with a railing? : A Lot 6 Click Score: 16    End of Session   Activity Tolerance: Patient tolerated treatment well;Patient limited by pain Patient left: in bed;with call bell/phone within reach Nurse Communication: Mobility status PT Visit Diagnosis: Unsteadiness on feet (R26.81);Other abnormalities of gait and mobility (R26.89);Muscle weakness (generalized) (M62.81)     Time: 1062-6948 PT Time Calculation (min) (ACUTE ONLY): 25 min  Charges:  $Therapeutic Activity: 8-22 mins $Self Care/Home Management: 8-22                    9:01 AM,09/19/21 Domenic Moras, PT, DPT Physical Therapist at [facility]

## 2021-09-19 NOTE — Discharge Summary (Addendum)
Physician Discharge Summary  Nicole [name] GLO:[id number] DOB: Jan 06, 1946 DOA: 09/15/2021  PCP: Jake Samples, PA-C [facility]   Admit date: 09/15/2021 Discharge date: 09/19/2021  Admitted From:  ALF Disposition:  ALF   Recommendations for Outpatient Follow-up:  Follow up with PCP in 1-2 weeks for recheck Follow up with [facility] regarding liver cirrhosis  Please obtain BMP in 2 weeks to follow up renal function, electrolytes   Home Health:  PT   Discharge Condition: STABLE   CODE STATUS: FULL  DIET: resume prior heart healthy diet    Brief Hospitalization Summary: Please see all hospital notes, images, labs for full details of the hospitalization. ADMISSION HPI:  75 y.o. female, with medical history significant for morbid obesity, chronic pain syndrome, hypertension, hyperlipidemia, GERD.  Patient was sent from ALF for evaluation for nausea, vomiting and diarrhea for 2 days, patient is a good historian, she is able to provide history, son at bedside assists with the history as well, patient with significant debility, she was discharged from [facility] in September of this year, she stayed briefly at [facility] rehab, where she she was discharged to ALF, patient reports nausea/vomiting/diarrhea with abdominal pain for last 2 days, she is with poor appetite, denies any blood in the stool, no coffee-ground emesis, she does report fever and chills at home, but she is afebrile here. - in ED she was afebrile, but her work-up significant for severe hypokalemia at 2.3, magnesium of 1.4, and AKI with creatinine of 1.49, she has CT abdomen pelvis to evaluate for her nausea/vomiting/diarrhea, with no significant GI findings, but was significant for few gas foci in left psoas muscle most likely related to severe vacuum disc arthritis, [facility] hospitalist consulted to admit.  HOSPITAL COURSE BY PROBLEM LIST  1)Small Left Psoas Muscle Abscess--- MRI of the pelvis and lumbar  spine reveals 2.0 cm fluid collection in the left psoas muscle at the L5 level concerning for abscess with a possible additional smaller abscess at the L4 level. -Discussed with general surgeon Dr. Arnoldo Morale abscess is too small for drainage -Treated empirically with IV Vanco and Rocephin WBC 12.1 >> 7.5 ESR 97 Fevers are Resolved -ESR is trending down. . . Discharge on Nicole doxycycline 100 mg BID to complete full 7 days course.    2)HTN--stable, controlled, continue metoprolol.     3) chronic pain syndrome--- resume home Nucynta.     4)HLD--continue Lipitor   5) hypokalemia/hypomagnesemia----repleted.    6)HFpEF--chronic diastolic dysfunction CHF, Lasix and metolazone on hold due to vomiting and diarrhea -Continue Toprol-XL, likely can resume diuretics at discharge 11/28.     7)Generalized weakness/Debility/deconditioning/left foot drop---  --PT eval appreciated recommends return to ALF with home health PT   8)Liver Cirrhosis--on CT abd/pelvis, liver ultrasound also suspicious for liver cirrhosis.  -Outpatient GI work-up advised, suspect steatosis as underlying cause.  -Denies significant EtOH use -Viral hepatitis profile negative.  Likely due to steatosis fatty liver disease -Follow up with [facility] for further work up.  She has seen them in past for endoscopies.    9)Social/Ethics--- from ALF at [facility], will return at discharge with home health PT.    10) vomiting and diarrhea--- CT abdomen and pelvis without acute bowel findings, -Stool for C. difficile is negative -GI symptoms RESOLVED now.   -discharge to ALF with [facility] PT 11/28.   Discussed with Son -[name] Status is: Inpatient   Remains inpatient appropriate because: Please see disposition above   Disposition: The  patient is from: ALF              Anticipated d/c is to: ALF              Anticipated d/c date is: 09/19/2021              Patient currently is medically stable to d/c.  DC to ALF 11/28.   Barriers:  Not Clinically Stable-    Code Status :  -  Code Status: Full Code    Family Communication:   (patient is alert, awake and coherent)  Discussed with son [name] Consults  :     DVT Prophylaxis  :   - SCDs   enoxaparin (LOVENOX) injection 40 mg Start: 09/15/21 1800 SCDs Start: 09/15/21 1624   Discharge Diagnoses:  Principal Problem:   Psoas abscess, left (2.0 cm on MRI)  Active Problems:   GERD (gastroesophageal reflux disease)   HTN (hypertension)   Chronic diastolic HF (heart failure) (HCC)   Hypokalemia   AKI (acute kidney injury) ([place])   Myositis   Discharge Instructions:  Allergies as of 09/19/2021       Reactions   Oxycontin [oxycodone Hcl] Swelling   Pt tolerates hydromorphone.   Sulfa Antibiotics Other (See Comments)   Sores   Tramadol    "Makes me feel like I am falling"   Penicillins Rash   Sulfasalazine Other (See Comments)   Sores        Medication List     STOP taking these medications    diclofenac 75 MG EC tablet Commonly known as: VOLTAREN   furosemide 40 MG tablet Commonly known as: LASIX   gabapentin 400 MG capsule Commonly known as: NEURONTIN   HYDROmorphone 4 MG tablet Commonly known as: DILAUDID   Kevzara 200 MG/1.14ML Soaj Generic drug: Sarilumab   methylPREDNISolone 4 MG Tbpk tablet Commonly known as: MEDROL DOSEPAK   oxyCODONE 5 MG immediate release tablet Commonly known as: Roxicodone   potassium chloride 10 MEQ tablet Commonly known as: KLOR-CON       TAKE these medications    acetaminophen 325 MG tablet Commonly known as: TYLENOL Take 2 tablets (650 mg total) by mouth every 6 (six) hours as needed for mild pain or headache (or Fever >/= 101).   atorvastatin 20 MG tablet Commonly known as: LIPITOR Take 20 mg by mouth daily.   calcitRIOL 0.25 MCG capsule Commonly known as: ROCALTROL Take 0.25 mcg by mouth See admin instructions. Take 0.25 every Mon, Wed, Fri.   cyclobenzaprine 5 MG tablet Commonly known as:  FLEXERIL Take 5 mg by mouth every 12 (twelve) hours as needed for muscle spasms. What changed: Another medication with the same name was removed. Continue taking this medication, and follow the directions you see here.   diclofenac Sodium 1 % Gel Commonly known as: VOLTAREN Apply 2 g topically 4 (four) times daily as needed (pain).   doxycycline 100 MG capsule Commonly known as: VIBRAMYCIN Take 1 capsule (100 mg total) by mouth 2 (two) times daily for 7 days.   GAS-X PO Take 1 tablet by mouth 3 (three) times daily as needed (flatulence).   HYDROcodone-acetaminophen 7.5-325 MG tablet Commonly known as: NORCO Take 1 tablet by mouth every 6 (six) hours as needed for up to 3 days for severe pain.   leflunomide 10 MG tablet Commonly known as: ARAVA TAKE (1) TABLET BY MOUTH DAILY. What changed:  See the new instructions. Another medication with the same name was removed.  Continue taking this medication, and follow the directions you see here.   lidocaine 5 % Commonly known as: LIDODERM Place 1-2 patches onto the skin every 12 (twelve) hours as needed (pain). What changed: Another medication with the same name was removed. Continue taking this medication, and follow the directions you see here.   metolazone 5 MG tablet Commonly known as: ZAROXOLYN Take 5 mg by mouth 2 (two) times a week. Tuesday and Fridays   metoprolol succinate 25 MG 24 hr tablet Commonly known as: Toprol XL Take 1 tablet (25 mg total) by mouth daily.   MULTIVITAMIN PO Take 1 tablet by mouth daily.   Nucynta ER 200 MG Tb12 Generic drug: Tapentadol HCl SMARTSIG:1 Tablet(s) By Mouth Every 12 Hours   pantoprazole 40 MG tablet Commonly known as: PROTONIX Take 1 tablet (40 mg total) by mouth daily. What changed:  when to take this Another medication with the same name was removed. Continue taking this medication, and follow the directions you see here.   potassium chloride 10 MEQ tablet Commonly known as:  KLOR-CON Take 10 mEq by mouth daily.   predniSONE 1 MG tablet Commonly known as: DELTASONE Take 9 tablets (9 mg total) by mouth daily with breakfast.   Torsemide 40 MG Tabs Take 40 mg by mouth 2 (two) times daily. Start taking on: September 21, 2021 What changed: These instructions start on September 21, 2021. If you are unsure what to do until then, ask your doctor or other care provider.        Follow-up Information     Jake Samples, PA-C. Schedule an appointment as soon as possible for a visit in 2 week(s).   Specialty: Family Medicine Why: Hospital Follow Up Contact information: [place] [address] [phone]         [place]. Schedule an appointment as soon as possible for a visit in 1 month(s).   Specialty: Gastroenterology Why: Hospital Follow Up liver cirrhosis Contact information: [address] [place] [phone]               Allergies  Allergen Reactions   Oxycontin [Oxycodone Hcl] Swelling    Pt tolerates hydromorphone.   Sulfa Antibiotics Other (See Comments)    Sores   Tramadol     "Makes me feel like I am falling"   Penicillins Rash   Sulfasalazine Other (See Comments)    Sores   Allergies as of 09/19/2021       Reactions   Oxycontin [oxycodone Hcl] Swelling   Pt tolerates hydromorphone.   Sulfa Antibiotics Other (See Comments)   Sores   Tramadol    "Makes me feel like I am falling"   Penicillins Rash   Sulfasalazine Other (See Comments)   Sores        Medication List     STOP taking these medications    diclofenac 75 MG EC tablet Commonly known as: VOLTAREN   furosemide 40 MG tablet Commonly known as: LASIX   gabapentin 400 MG capsule Commonly known as: NEURONTIN   HYDROmorphone 4 MG tablet Commonly known as: DILAUDID   Kevzara 200 MG/1.14ML Soaj Generic drug: Sarilumab   methylPREDNISolone 4 MG Tbpk  tablet Commonly known as: MEDROL DOSEPAK   oxyCODONE 5 MG immediate release tablet Commonly known as: Roxicodone   potassium chloride 10 MEQ tablet Commonly known as: KLOR-CON       TAKE these medications  acetaminophen 325 MG tablet Commonly known as: TYLENOL Take 2 tablets (650 mg total) by mouth every 6 (six) hours as needed for mild pain or headache (or Fever >/= 101).   atorvastatin 20 MG tablet Commonly known as: LIPITOR Take 20 mg by mouth daily.   calcitRIOL 0.25 MCG capsule Commonly known as: ROCALTROL Take 0.25 mcg by mouth See admin instructions. Take 0.25 every Mon, Wed, Fri.   cyclobenzaprine 5 MG tablet Commonly known as: FLEXERIL Take 5 mg by mouth every 12 (twelve) hours as needed for muscle spasms. What changed: Another medication with the same name was removed. Continue taking this medication, and follow the directions you see here.   diclofenac Sodium 1 % Gel Commonly known as: VOLTAREN Apply 2 g topically 4 (four) times daily as needed (pain).   doxycycline 100 MG capsule Commonly known as: VIBRAMYCIN Take 1 capsule (100 mg total) by mouth 2 (two) times daily for 7 days.   GAS-X PO Take 1 tablet by mouth 3 (three) times daily as needed (flatulence).   HYDROcodone-acetaminophen 7.5-325 MG tablet Commonly known as: NORCO Take 1 tablet by mouth every 6 (six) hours as needed for up to 3 days for severe pain.   leflunomide 10 MG tablet Commonly known as: ARAVA TAKE (1) TABLET BY MOUTH DAILY. What changed:  See the new instructions. Another medication with the same name was removed. Continue taking this medication, and follow the directions you see here.   lidocaine 5 % Commonly known as: LIDODERM Place 1-2 patches onto the skin every 12 (twelve) hours as needed (pain). What changed: Another medication with the same name was removed. Continue taking this medication, and follow the directions you see here.   metolazone 5 MG tablet Commonly  known as: ZAROXOLYN Take 5 mg by mouth 2 (two) times a week. Tuesday and Fridays   metoprolol succinate 25 MG 24 hr tablet Commonly known as: Toprol XL Take 1 tablet (25 mg total) by mouth daily.   MULTIVITAMIN PO Take 1 tablet by mouth daily.   Nucynta ER 200 MG Tb12 Generic drug: Tapentadol HCl SMARTSIG:1 Tablet(s) By Mouth Every 12 Hours   pantoprazole 40 MG tablet Commonly known as: PROTONIX Take 1 tablet (40 mg total) by mouth daily. What changed:  when to take this Another medication with the same name was removed. Continue taking this medication, and follow the directions you see here.   potassium chloride 10 MEQ tablet Commonly known as: KLOR-CON Take 10 mEq by mouth daily.   predniSONE 1 MG tablet Commonly known as: DELTASONE Take 9 tablets (9 mg total) by mouth daily with breakfast.   Torsemide 40 MG Tabs Take 40 mg by mouth 2 (two) times daily. Start taking on: September 21, 2021 What changed: These instructions start on September 21, 2021. If you are unsure what to do until then, ask your doctor or other care provider.       Procedures/Studies: MR LUMBAR SPINE WO CONTRAST  Result Date: 09/16/2021 CLINICAL DATA:  Back pain common gas seen in psoas muscle on recent CT EXAM: MRI LUMBAR SPINE WITHOUT CONTRAST TECHNIQUE: Multiplanar, multisequence MR imaging of the lumbar spine was performed. No intravenous contrast was administered. COMPARISON:  CT abdomen/pelvis dated 1 day prior, lumbar spine MRI 06/21/2021 FINDINGS: Segmentation: There is transitional anatomy with lumbarization of the S1 vertebral body. For the purposes of this report, there is a rudimentary S1-S2 disc space. Alignment:  There is trace retrolisthesis of L1 on L2 and L2 on  L3. Vertebrae: There are postsurgical changes reflecting posterior decompression from L2 through L5. Vertebral body heights are preserved. There is multilevel degenerative endplate marrow signal abnormality throughout the lumbar  spine. There is no suspicious marrow signal abnormality. There is no marrow edema to suggest infection. There is no definite evidence of septic arthritis. Conus medullaris and cauda equina: Conus extends to the L1-L2 level. Conus and cauda equina appear normal. Paraspinal and other soft tissues: There is a 2.0 cm x 0.8 cm by 1.5 cm fluid collection in the left psoas muscle at the L5 level. There may be an additional smaller abscess measuring up to 0.8 cm more superiorly at the L4 level (7-15, 11-18). The paraspinal soft tissues are otherwise unremarkable. Disc levels: There is multilevel intervertebral disc space narrowing. Marked T2 hypointensity in the anterior aspect of the L3-L4 and L4-L5 disc spaces is consistent with vacuum disc phenomenon. There is advanced multilevel facet arthropathy with prominent effusions at L4-L5 and L5-S1, similar to the prior study. T12-L1: Is a mild disc bulge and ligamentum flavum thickening resulting in mild spinal canal stenosis and mild-to-moderate bilateral neural foraminal stenosis. L1-L2: Status post posterior decompression. There is a prominent diffuse disc bulge, degenerative endplate change, and bilateral facet arthropathy resulting in mild spinal canal stenosis and severe bilateral neural foraminal stenosis, unchanged. L2-L3: Status post posterior decompression. There is a mild disc bulge, degenerative endplate change, and bilateral facet arthropathy resulting in severe bilateral neural foraminal stenosis without significant spinal canal stenosis, unchanged. L3-L4: Status post posterior decompression. There is a prominent diffuse disc bulge, degenerative endplate change, and bilateral facet arthropathy resulting in severe bilateral neural foraminal stenosis without significant spinal canal stenosis, unchanged. L4-L5: Status post posterior decompression. There is a prominent diffuse disc bulge, degenerative endplate change, and bilateral facet arthropathy resulting in mild  spinal canal stenosis and severe bilateral neural foraminal stenosis, unchanged. L5-S1: There is a diffuse disc bulge with a left subarticular zone annular fissure, degenerative endplate change, and bilateral facet arthropathy resulting in severe left worse than right neural foraminal stenosis without significant spinal canal stenosis, unchanged. IMPRESSION: 1. Postcontrast images were not obtained as the patient could not complete the study. Within this confines: 2. 2.0 cm fluid collection in the left psoas muscle at the L5 level concerning for abscess with a possible additional smaller abscess at the L4 level. 3. No abnormal marrow edema to suggest adjacent discitis/osteomyelitis, though as above, postcontrast images were not obtained. 4. Severe multilevel degenerative changes as above, not significantly changed since 06/21/2021. Electronically Signed   By: Valetta Mole M.D.   On: 09/16/2021 14:07   MR PELVIS WO CONTRAST  Result Date: 09/16/2021 CLINICAL DATA:  Intramuscular gas within the left iliopsoas muscle on CT. Severe back pain. EXAM: MRI PELVIS WITHOUT CONTRAST TECHNIQUE: Multiplanar multisequence MR imaging of the pelvis was performed. No intravenous contrast was administered. Patient was unable to continue the examination. No contrast was administered. Lumbar spine findings are dictated separately. COMPARISON:  Abdominopelvic CT 09/15/2021 and 06/21/2021. FINDINGS: Bones/Joint/Cartilage Within the pelvis, there is no evidence of acute fracture or dislocation. There are mild degenerative changes of both hips. No evidence of avascular necrosis or significant hip joint effusion. The sacroiliac joints are intact with mild degenerative changes on the left. There is no sacroiliac joint effusion or suspicious subchondral signal abnormality. There is multilevel lumbar spondylosis status post multilevel posterior decompression. Lumbar spine findings are dictated separately. Ligaments Not relevant for  exam/indication. Muscles and Tendons Associated with the gas  in the left iliopsoas muscle on recent CT is a small amount of muscular edema as well as a small fluid collection measuring up to 1.8 cm posterior to the muscle at the L5 level. No drainable fluid collection identified. Muscular edema extends into the pelvis without associated focal fluid collection. There is no significant fluid in the iliopsoas bursa. There is asymmetric fatty atrophy of the left gluteus, piriformis and hamstring muscles. No acute tendon tear identified. Soft tissues There is a chronic left adnexal mass measuring 3.7 cm on image 29/7. This demonstrates low T1 and T2 signal, corresponding with calcification on CT. This mass appears grossly unchanged from CTs dating back to 08/20/2013. As above, left paraspinal inflammatory changes and gas within the ileus psoas muscle. No other focal fluid collections are identified. IMPRESSION: 1. There is a small amount of muscular edema within the left iliopsoas muscle, associated with a small paraspinal fluid collection at the L5 level. See separate examination of the lumbar spine. 2. No intrapelvic fluid collections. 3. Mild degenerative changes of the hips and sacroiliac joints. 4. Stable partially calcified left adnexal mass from at least 2014, consistent with a benign finding. Electronically Signed   By: Richardean Sale M.D.   On: 09/16/2021 13:50   CT Abdomen Pelvis W Contrast  Result Date: 09/15/2021 CLINICAL DATA:  75 year old female with acute LEFT abdominal and pelvic pain for 2 days. EXAM: CT ABDOMEN AND PELVIS WITH CONTRAST TECHNIQUE: Multidetector CT imaging of the abdomen and pelvis was performed using the standard protocol following bolus administration of intravenous contrast. CONTRAST:  33m OMNIPAQUE IOHEXOL 300 MG/ML  SOLN COMPARISON:  06/21/2021 CT and prior studies FINDINGS: Lower chest: No acute abnormality Hepatobiliary: Mildly nodular hepatic contour or likely represent  cirrhosis. No other hepatic abnormalities are noted. Patient is status post cholecystectomy. No biliary dilatation. Pancreas: Unremarkable Spleen: Unremarkable Adrenals/Urinary Tract: The kidneys, adrenal glands and bladder are unremarkable. No hydronephrosis or renal calculi. Stomach/Bowel: Scattered diverticula within the descending and sigmoid colon noted without evidence of acute diverticulitis. There is no evidence of bowel obstruction or focal collection. The appendix is normal. Vascular/Lymphatic: Aortic atherosclerosis. No enlarged abdominal or pelvic lymph nodes. Reproductive: No new abnormalities. A 4 cm LEFT adnexal mass appears unchanged from remote studies. Other: No ascites or pneumoperitoneum. Musculoskeletal: Again noted is moderate to severe multilevel degenerative disc disease, spondylosis and facet arthropathy throughout the lumbar spine with vacuum disc phenomenon. However, now noted are foci of gas within the LEFT psoas muscle which appears to extend from the L3-4 or L4-5 vacuum discs. No definite LEFT psoas focal collection or enhancement is identified. IMPRESSION: 1. New foci of gas within the LEFT psoas muscle which appears to extend from L3-4 or L4-5 degenerative vacuum discs. Although this may represent noninfectious extension of gas from the disc space, infection is not excluded although there is no definite evidence of focal collection/enhancement of the LEFT psoas muscle. Recommend MRI with and without contrast for further evaluation as clinically indicated. 2. Mildly nodular hepatic contour suspicious for cirrhosis. 3. Aortic Atherosclerosis (ICD10-I70.0). Electronically Signed   By: JMargarette CanadaM.D.   On: 09/15/2021 12:46   [place]Abdomen Limited RUQ (LIVER/GB)  Result Date: 09/16/2021 CLINICAL DATA:  Abdomen pain EXAM: ULTRASOUND ABDOMEN LIMITED RIGHT UPPER QUADRANT COMPARISON:  CT 09/15/2021. FINDINGS: Gallbladder: Status post cholecystectomy. Common bile duct: Diameter: 1.8 mm  Liver: Slightly nodular hepatic contour. No focal hepatic abnormality. Liver is slightly echogenic. Portal vein is patent on color Doppler imaging with normal  direction of blood flow towards the liver. Other: None. IMPRESSION: 1. Status post cholecystectomy without biliary dilatation 2. Slightly echogenic nodular liver suspicious for cirrhosis Electronically Signed   By: Donavan Foil M.D.   On: 09/16/2021 19:53     Subjective: Pt reports painful areas in back and legs but relieved with pain medication.  No other complaints.  Pt agreeable to return to her ALF.     Discharge Exam: Vitals:   09/18/21 2121 09/19/21 0537  BP: (!) 143/77 (!) 142/78  Pulse: 77 79  Resp: 16 16  Temp: 98.3 F (36.8 C) (!) 97.5 F (36.4 C)  SpO2: 91% 94%   Vitals:   09/18/21 1421 09/18/21 2038 09/18/21 2121 09/19/21 0537  BP: (!) 150/72  (!) 143/77 (!) 142/78  Pulse: 80  77 79  Resp: '16  16 16  ' Temp: 98.6 F (37 C) 98.1 F (36.7 C) 98.3 F (36.8 C) (!) 97.5 F (36.4 C)  TempSrc: Nicole Nicole Nicole Nicole  SpO2: 96%  91% 94%  Weight:      Height:       Gen:- Awake Alert, morbidly obese, cooperative, NAD.  HEENT:- NCAT, PERRL, nares clear, No sclera icterus Neck- no JVD, no thyromegaly appreciated.   Lungs-  no increased work of breathing.   CV- normal S1, S2 sounds.  No M/R/G.   Abd-  +ve B.Sounds, Abd Soft, No tenderness, increased truncal adiposity Extremity/Skin: pedal pulses present  Psych-affect is appropriate, oriented x3 Neuro-generalized weakness, no new focal deficits, no tremors MSK-bilateral thigh pain left more than right, overlying skin without erythema or streaking  The results of significant diagnostics from this hospitalization (including imaging, microbiology, ancillary and laboratory) are listed below for reference.     Microbiology: Recent Results (from the past 240 hour(s))  Resp Panel by RT-PCR (Flu A&B, Covid) Nasopharyngeal Swab     Status: None   Collection Time: 09/15/21 11:08  AM   Specimen: Nasopharyngeal Swab; Nasopharyngeal(NP) swabs in vial transport medium  Result Value Ref Range Status   SARS Coronavirus 2 by RT PCR NEGATIVE NEGATIVE Final    Comment: (NOTE) SARS-CoV-2 target nucleic acids are NOT DETECTED.  The SARS-CoV-2 RNA is generally detectable in upper respiratory specimens during the acute phase of infection. The lowest concentration of SARS-CoV-2 viral copies this assay can detect is 138 copies/mL. A negative result does not preclude SARS-Cov-2 infection and should not be used as the sole basis for treatment or other patient management decisions. A negative result may occur with  improper specimen collection/handling, submission of specimen other than nasopharyngeal swab, presence of viral mutation(s) within the areas targeted by this assay, and inadequate number of viral copies(<138 copies/mL). A negative result must be combined with clinical observations, patient history, and epidemiological information. The expected result is Negative.  Fact Sheet for Patients:  EntrepreneurPulse.com.au  Fact Sheet for Healthcare Providers:  IncredibleEmployment.be  This test is no t yet approved or cleared by the [place] FDA and  has been authorized for detection and/or diagnosis of SARS-CoV-2 by FDA under an Emergency Use Authorization (EUA). This EUA will remain  in effect (meaning this test can be used) for the duration of the COVID-19 declaration under Section 564(b)(1) of the Act, 21 U.S.C.section 360bbb-3(b)(1), unless the authorization is terminated  or revoked sooner.       Influenza A by PCR NEGATIVE NEGATIVE Final   Influenza B by PCR NEGATIVE NEGATIVE Final    Comment: (NOTE) The Xpert Xpress SARS-CoV-2/FLU/RSV plus assay  is intended as an aid in the diagnosis of influenza from Nasopharyngeal swab specimens and should not be used as a sole basis for treatment. Nasal washings and aspirates are  unacceptable for Xpert Xpress SARS-CoV-2/FLU/RSV testing.  Fact Sheet for Patients: EntrepreneurPulse.com.au  Fact Sheet for Healthcare Providers: IncredibleEmployment.be  This test is not yet approved or cleared by the [place] FDA and has been authorized for detection and/or diagnosis of SARS-CoV-2 by FDA under an Emergency Use Authorization (EUA). This EUA will remain in effect (meaning this test can be used) for the duration of the COVID-19 declaration under Section 564(b)(1) of the Act, 21 U.S.C. section 360bbb-3(b)(1), unless the authorization is terminated or revoked.  Performed at [facility], [address], [place], [place] [address]   Culture, blood (routine x 2)     Status: None (Preliminary result)   Collection Time: 09/15/21  1:28 PM   Specimen: BLOOD RIGHT HAND  Result Value Ref Range Status   Specimen Description BLOOD RIGHT HAND  Final   Special Requests   Final    Blood Culture adequate volume BOTTLES DRAWN AEROBIC AND ANAEROBIC   Culture   Final    NO GROWTH 4 DAYS Performed at [facility], [address], [place], [place] [address]    Report Status PENDING  Incomplete  Culture, blood (routine x 2)     Status: None (Preliminary result)   Collection Time: 09/15/21  1:28 PM   Specimen: BLOOD LEFT ARM  Result Value Ref Range Status   Specimen Description BLOOD LEFT ARM  Final   Special Requests   Final    Blood Culture adequate volume BOTTLES DRAWN AEROBIC AND ANAEROBIC   Culture   Final    NO GROWTH 4 DAYS Performed at [facility], [address], [place], [place] [address]    Report Status PENDING  Incomplete  C Difficile Quick Screen w PCR reflex     Status: None   Collection Time: 09/16/21 12:57 PM   Specimen: STOOL  Result Value Ref Range Status   C Diff antigen NEGATIVE NEGATIVE Final   C Diff toxin NEGATIVE NEGATIVE Final   C Diff interpretation No C. difficile detected.  Final    Comment: Performed at  [facility], [address], [place],  [address]     Labs: BNP (last 3 results) No results for input(s): BNP in the last 8760 hours. Basic Metabolic Panel: Recent Labs  Lab 09/15/21 1055 09/15/21 1225 09/15/21 2131 09/16/21 0601 09/17/21 0534 09/18/21 0503 09/19/21 0355  NA 138  --  135 139 140 140  --   K 2.3*  --  3.3* 3.1* 3.2* 3.4*  --   CL 94*  --  98 100 106 108  --   CO2 27  --  '22 25 24 24  ' --   GLUCOSE 145*  --  118* 88 86 81  --   BUN 24*  --  '23 23 17 10  ' --   CREATININE 1.49*  --  1.53* 1.52* 1.16* 1.05* 0.96  CALCIUM 9.1  --  8.4* 8.3* 8.6* 8.5*  --   MG  --  1.4*  --   --  2.2  --   --   PHOS  --   --   --   --  2.7  --   --    Liver Function Tests: Recent Labs  Lab 09/15/21 1055 09/17/21 0534  AST 30  --   ALT 21  --  ALKPHOS 107  --   BILITOT 0.8  --   PROT 7.8  --   ALBUMIN 3.5 2.9*   Recent Labs  Lab 09/15/21 1055  LIPASE 34   No results for input(s): AMMONIA in the last 168 hours. CBC: Recent Labs  Lab 09/15/21 1055 09/16/21 0601 09/18/21 0503  WBC 12.1* 7.5 7.1  NEUTROABS 8.2*  --   --   HGB 14.1 12.2 11.1*  HCT 40.9 36.5 34.8*  MCV 92.5 95.8 97.8  PLT 414* 371 328   Cardiac Enzymes: No results for input(s): CKTOTAL, CKMB, CKMBINDEX, TROPONINI in the last 168 hours. BNP: Invalid input(s): POCBNP CBG: No results for input(s): GLUCAP in the last 168 hours. D-Dimer No results for input(s): DDIMER in the last 72 hours. Hgb A1c No results for input(s): HGBA1C in the last 72 hours. Lipid Profile Recent Labs    09/18/21 0503  CHOL 135  HDL 46  LDLCALC 55  TRIG 169*  CHOLHDL 2.9   Thyroid function studies No results for input(s): TSH, T4TOTAL, T3FREE, THYROIDAB in the last 72 hours.  Invalid input(s): FREET3 Anemia work up No results for input(s): VITAMINB12, FOLATE, FERRITIN, TIBC, IRON, RETICCTPCT in the last 72 hours. Urinalysis    Component Value Date/Time   COLORURINE YELLOW 09/15/2021 1348   APPEARANCEUR  CLEAR 09/15/2021 1348   LABSPEC <1.005 (L) 09/15/2021 1348   PHURINE 7.0 09/15/2021 1348   GLUCOSEU NEGATIVE 09/15/2021 1348   HGBUR NEGATIVE 09/15/2021 [place] 09/15/2021 1348   BILIRUBINUR negative 02/14/2020 1328   KETONESUR NEGATIVE 09/15/2021 1348   PROTEINUR NEGATIVE 09/15/2021 1348   UROBILINOGEN 0.2 02/14/2020 1328   UROBILINOGEN 0.2 02/20/2014 1116   NITRITE NEGATIVE 09/15/2021 1348   LEUKOCYTESUR TRACE (A) 09/15/2021 1348   Sepsis Labs Invalid input(s): PROCALCITONIN,  WBC,  LACTICIDVEN Microbiology Recent Results (from the past 240 hour(s))  Resp Panel by RT-PCR (Flu A&B, Covid) Nasopharyngeal Swab     Status: None   Collection Time: 09/15/21 11:08 AM   Specimen: Nasopharyngeal Swab; Nasopharyngeal(NP) swabs in vial transport medium  Result Value Ref Range Status   SARS Coronavirus 2 by RT PCR NEGATIVE NEGATIVE Final    Comment: (NOTE) SARS-CoV-2 target nucleic acids are NOT DETECTED.  The SARS-CoV-2 RNA is generally detectable in upper respiratory specimens during the acute phase of infection. The lowest concentration of SARS-CoV-2 viral copies this assay can detect is 138 copies/mL. A negative result does not preclude SARS-Cov-2 infection and should not be used as the sole basis for treatment or other patient management decisions. A negative result may occur with  improper specimen collection/handling, submission of specimen other than nasopharyngeal swab, presence of viral mutation(s) within the areas targeted by this assay, and inadequate number of viral copies(<138 copies/mL). A negative result must be combined with clinical observations, patient history, and epidemiological information. The expected result is Negative.  Fact Sheet for Patients:  EntrepreneurPulse.com.au  Fact Sheet for Healthcare Providers:  IncredibleEmployment.be  This test is no t yet approved or cleared by the [place] FDA  and  has been authorized for detection and/or diagnosis of SARS-CoV-2 by FDA under an Emergency Use Authorization (EUA). This EUA will remain  in effect (meaning this test can be used) for the duration of the COVID-19 declaration under Section 564(b)(1) of the Act, 21 U.S.C.section 360bbb-3(b)(1), unless the authorization is terminated  or revoked sooner.       Influenza A by PCR NEGATIVE NEGATIVE Final   Influenza B by PCR  NEGATIVE NEGATIVE Final    Comment: (NOTE) The Xpert Xpress SARS-CoV-2/FLU/RSV plus assay is intended as an aid in the diagnosis of influenza from Nasopharyngeal swab specimens and should not be used as a sole basis for treatment. Nasal washings and aspirates are unacceptable for Xpert Xpress SARS-CoV-2/FLU/RSV testing.  Fact Sheet for Patients: EntrepreneurPulse.com.au  Fact Sheet for Healthcare Providers: IncredibleEmployment.be  This test is not yet approved or cleared by the [place] FDA and has been authorized for detection and/or diagnosis of SARS-CoV-2 by FDA under an Emergency Use Authorization (EUA). This EUA will remain in effect (meaning this test can be used) for the duration of the COVID-19 declaration under Section 564(b)(1) of the Act, 21 U.S.C. section 360bbb-3(b)(1), unless the authorization is terminated or revoked.  Performed at [facility], [address] [place], [place] [address]   Culture, blood (routine x 2)     Status: None (Preliminary result)   Collection Time: 09/15/21  1:28 PM   Specimen: BLOOD RIGHT HAND  Result Value Ref Range Status   Specimen Description BLOOD RIGHT HAND  Final   Special Requests   Final    Blood Culture adequate volume BOTTLES DRAWN AEROBIC AND ANAEROBIC   Culture   Final    NO GROWTH 4 DAYS Performed at [facility], [address], [place], [place] [address]    Report Status PENDING  Incomplete  Culture, blood (routine x 2)     Status: None (Preliminary  result)   Collection Time: 09/15/21  1:28 PM   Specimen: BLOOD LEFT ARM  Result Value Ref Range Status   Specimen Description BLOOD LEFT ARM  Final   Special Requests   Final    Blood Culture adequate volume BOTTLES DRAWN AEROBIC AND ANAEROBIC   Culture   Final    NO GROWTH 4 DAYS Performed at [facility], [address] [place], [place] [address]    Report Status PENDING  Incomplete  C Difficile Quick Screen w PCR reflex     Status: None   Collection Time: 09/16/21 12:57 PM   Specimen: STOOL  Result Value Ref Range Status   C Diff antigen NEGATIVE NEGATIVE Final   C Diff toxin NEGATIVE NEGATIVE Final   C Diff interpretation No C. difficile detected.  Final    Comment: Performed at [facility], [address] [place], [place] [address]   Time coordinating discharge: 38 minutes   SIGNED:  Irwin Brakeman, MD  [facility] 09/19/2021, 11:12 AM How to contact the [facility] Attending or Consulting provider [place] or covering provider during after hours [place], for this patient?  Check the care team in [facility] and look for a) attending/consulting [facility] provider listed and b) the [facility] team listed Log into [email or web address] and use [place]'s universal password to access. If you do not have the password, please contact the hospital operator. Locate the [facility] provider you are looking for under [facility] and page to a number that you can be directly reached. If you still have difficulty reaching the provider, please page the [facility] (Director on Call) for the Hospitalists listed on amion for assistance.

## 2021-09-19 NOTE — Discharge Instructions (Signed)
IMPORTANT INFORMATION: PAY CLOSE ATTENTION   PHYSICIAN DISCHARGE INSTRUCTIONS  Follow with Primary care provider  Jackson, Samantha J, PA-C  and other consultants as instructed by your Hospitalist Physician  SEEK MEDICAL CARE OR RETURN TO EMERGENCY ROOM IF SYMPTOMS COME BACK, WORSEN OR NEW PROBLEM DEVELOPS   Please note: You were cared for by a hospitalist during your hospital stay. Every effort will be made to forward records to your primary care provider.  You can request that your primary care provider send for your hospital records if they have not received them.  Once you are discharged, your primary care physician will handle any further medical issues. Please note that NO REFILLS for any discharge medications will be authorized once you are discharged, as it is imperative that you return to your primary care physician (or establish a relationship with a primary care physician if you do not have one) for your post hospital discharge needs so that they can reassess your need for medications and monitor your lab values.  Please get a complete blood count and chemistry panel checked by your Primary MD at your next visit, and again as instructed by your Primary MD.  Get Medicines reviewed and adjusted: Please take all your medications with you for your next visit with your Primary MD  Laboratory/radiological data: Please request your Primary MD to go over all hospital tests and procedure/radiological results at the follow up, please ask your primary care provider to get all Hospital records sent to his/her office.  In some cases, they will be blood work, cultures and biopsy results pending at the time of your discharge. Please request that your primary care provider follow up on these results.  If you are diabetic, please bring your blood sugar readings with you to your follow up appointment with primary care.    Please call and make your follow up appointments as soon as possible.    Also  Note the following: If you experience worsening of your admission symptoms, develop shortness of breath, life threatening emergency, suicidal or homicidal thoughts you must seek medical attention immediately by calling 911 or calling your MD immediately  if symptoms less severe.  You must read complete instructions/literature along with all the possible adverse reactions/side effects for all the Medicines you take and that have been prescribed to you. Take any new Medicines after you have completely understood and accpet all the possible adverse reactions/side effects.   Do not drive when taking Pain medications or sleeping medications (Benzodiazepines)  Do not take more than prescribed Pain, Sleep and Anxiety Medications. It is not advisable to combine anxiety,sleep and pain medications without talking with your primary care practitioner  Special Instructions: If you have smoked or chewed Tobacco  in the last 2 yrs please stop smoking, stop any regular Alcohol  and or any Recreational drug use.  Wear Seat belts while driving.  Do not drive if taking any narcotic, mind altering or controlled substances or recreational drugs or alcohol.        

## 2021-09-20 LAB — CULTURE, BLOOD (ROUTINE X 2)
Culture: NO GROWTH
Culture: NO GROWTH
Special Requests: ADEQUATE
Special Requests: ADEQUATE

## 2021-09-22 DIAGNOSIS — M6281 Muscle weakness (generalized): Secondary | ICD-10-CM | POA: Diagnosis not present

## 2021-09-22 DIAGNOSIS — I110 Hypertensive heart disease with heart failure: Secondary | ICD-10-CM | POA: Diagnosis not present

## 2021-09-22 DIAGNOSIS — M353 Polymyalgia rheumatica: Secondary | ICD-10-CM | POA: Diagnosis not present

## 2021-09-22 DIAGNOSIS — I509 Heart failure, unspecified: Secondary | ICD-10-CM | POA: Diagnosis not present

## 2021-09-22 DIAGNOSIS — K6812 Psoas muscle abscess: Secondary | ICD-10-CM | POA: Diagnosis not present

## 2021-09-22 DIAGNOSIS — F32A Depression, unspecified: Secondary | ICD-10-CM | POA: Diagnosis not present

## 2021-09-22 DIAGNOSIS — E785 Hyperlipidemia, unspecified: Secondary | ICD-10-CM | POA: Diagnosis not present

## 2021-09-24 ENCOUNTER — Encounter (HOSPITAL_COMMUNITY): Payer: Self-pay

## 2021-09-24 ENCOUNTER — Emergency Department (HOSPITAL_COMMUNITY): Payer: Medicare Other

## 2021-09-24 ENCOUNTER — Other Ambulatory Visit: Payer: Self-pay

## 2021-09-24 ENCOUNTER — Inpatient Hospital Stay (HOSPITAL_COMMUNITY)
Admission: EM | Admit: 2021-09-24 | Discharge: 2021-09-29 | DRG: 682 | Disposition: A | Discharge status: Nursing Home (Includes ICF) | Payer: Medicare Other | Attending: Family Medicine | Admitting: Family Medicine

## 2021-09-24 DIAGNOSIS — M47812 Spondylosis without myelopathy or radiculopathy, cervical region: Secondary | ICD-10-CM | POA: Diagnosis present

## 2021-09-24 DIAGNOSIS — I5032 Chronic diastolic (congestive) heart failure: Secondary | ICD-10-CM | POA: Diagnosis present

## 2021-09-24 DIAGNOSIS — R627 Adult failure to thrive: Secondary | ICD-10-CM | POA: Diagnosis present

## 2021-09-24 DIAGNOSIS — Z833 Family history of diabetes mellitus: Secondary | ICD-10-CM

## 2021-09-24 DIAGNOSIS — K651 Peritoneal abscess: Secondary | ICD-10-CM | POA: Diagnosis not present

## 2021-09-24 DIAGNOSIS — M353 Polymyalgia rheumatica: Secondary | ICD-10-CM | POA: Diagnosis present

## 2021-09-24 DIAGNOSIS — Z880 Allergy status to penicillin: Secondary | ICD-10-CM | POA: Diagnosis not present

## 2021-09-24 DIAGNOSIS — R112 Nausea with vomiting, unspecified: Secondary | ICD-10-CM | POA: Diagnosis present

## 2021-09-24 DIAGNOSIS — M47816 Spondylosis without myelopathy or radiculopathy, lumbar region: Secondary | ICD-10-CM | POA: Diagnosis present

## 2021-09-24 DIAGNOSIS — I10 Essential (primary) hypertension: Secondary | ICD-10-CM | POA: Diagnosis present

## 2021-09-24 DIAGNOSIS — R531 Weakness: Secondary | ICD-10-CM | POA: Diagnosis not present

## 2021-09-24 DIAGNOSIS — N179 Acute kidney failure, unspecified: Secondary | ICD-10-CM | POA: Diagnosis present

## 2021-09-24 DIAGNOSIS — M419 Scoliosis, unspecified: Secondary | ICD-10-CM | POA: Diagnosis not present

## 2021-09-24 DIAGNOSIS — Z8261 Family history of arthritis: Secondary | ICD-10-CM

## 2021-09-24 DIAGNOSIS — E876 Hypokalemia: Secondary | ICD-10-CM | POA: Diagnosis present

## 2021-09-24 DIAGNOSIS — Z6841 Body Mass Index (BMI) 40.0 and over, adult: Secondary | ICD-10-CM

## 2021-09-24 DIAGNOSIS — Z882 Allergy status to sulfonamides status: Secondary | ICD-10-CM | POA: Diagnosis not present

## 2021-09-24 DIAGNOSIS — K6812 Psoas muscle abscess: Secondary | ICD-10-CM | POA: Diagnosis present

## 2021-09-24 DIAGNOSIS — Z885 Allergy status to narcotic agent status: Secondary | ICD-10-CM

## 2021-09-24 DIAGNOSIS — Z7952 Long term (current) use of systemic steroids: Secondary | ICD-10-CM | POA: Diagnosis not present

## 2021-09-24 DIAGNOSIS — E860 Dehydration: Secondary | ICD-10-CM | POA: Diagnosis present

## 2021-09-24 DIAGNOSIS — K219 Gastro-esophageal reflux disease without esophagitis: Secondary | ICD-10-CM | POA: Diagnosis present

## 2021-09-24 DIAGNOSIS — Z8249 Family history of ischemic heart disease and other diseases of the circulatory system: Secondary | ICD-10-CM | POA: Diagnosis not present

## 2021-09-24 DIAGNOSIS — R52 Pain, unspecified: Secondary | ICD-10-CM | POA: Diagnosis not present

## 2021-09-24 DIAGNOSIS — I110 Hypertensive heart disease with heart failure: Secondary | ICD-10-CM | POA: Diagnosis present

## 2021-09-24 DIAGNOSIS — Z20822 Contact with and (suspected) exposure to covid-19: Secondary | ICD-10-CM | POA: Diagnosis present

## 2021-09-24 DIAGNOSIS — M549 Dorsalgia, unspecified: Secondary | ICD-10-CM | POA: Diagnosis present

## 2021-09-24 DIAGNOSIS — Z79899 Other long term (current) drug therapy: Secondary | ICD-10-CM | POA: Diagnosis not present

## 2021-09-24 DIAGNOSIS — R519 Headache, unspecified: Secondary | ICD-10-CM | POA: Diagnosis present

## 2021-09-24 DIAGNOSIS — Z872 Personal history of diseases of the skin and subcutaneous tissue: Secondary | ICD-10-CM | POA: Diagnosis not present

## 2021-09-24 DIAGNOSIS — M47817 Spondylosis without myelopathy or radiculopathy, lumbosacral region: Secondary | ICD-10-CM | POA: Diagnosis not present

## 2021-09-24 DIAGNOSIS — M1990 Unspecified osteoarthritis, unspecified site: Secondary | ICD-10-CM | POA: Diagnosis present

## 2021-09-24 DIAGNOSIS — R1310 Dysphagia, unspecified: Secondary | ICD-10-CM | POA: Diagnosis present

## 2021-09-24 LAB — BASIC METABOLIC PANEL
Anion gap: 20 — ABNORMAL HIGH (ref 5–15)
BUN: 58 mg/dL — ABNORMAL HIGH (ref 8–23)
CO2: 20 mmol/L — ABNORMAL LOW (ref 22–32)
Calcium: 7.6 mg/dL — ABNORMAL LOW (ref 8.9–10.3)
Chloride: 92 mmol/L — ABNORMAL LOW (ref 98–111)
Creatinine, Ser: 4.37 mg/dL — ABNORMAL HIGH (ref 0.44–1.00)
GFR, Estimated: 10 mL/min — ABNORMAL LOW (ref >60)
Glucose, Bld: 100 mg/dL — ABNORMAL HIGH (ref 70–99)
Potassium: 3 mmol/L — ABNORMAL LOW (ref 3.5–5.1)
Sodium: 132 mmol/L — ABNORMAL LOW (ref 135–145)

## 2021-09-24 LAB — CBC WITH DIFFERENTIAL/PLATELET
Abs Immature Granulocytes: 0.04 10*3/uL (ref 0.00–0.07)
Basophils Absolute: 0.1 10*3/uL (ref 0.0–0.1)
Basophils Relative: 1 %
Eosinophils Absolute: 0.1 10*3/uL (ref 0.0–0.5)
Eosinophils Relative: 1 %
HCT: 39.5 % (ref 36.0–46.0)
Hemoglobin: 13.7 g/dL (ref 12.0–15.0)
Immature Granulocytes: 0 %
Lymphocytes Relative: 25 %
Lymphs Abs: 2.8 10*3/uL (ref 0.7–4.0)
MCH: 31.9 pg (ref 26.0–34.0)
MCHC: 34.7 g/dL (ref 30.0–36.0)
MCV: 91.9 fL (ref 80.0–100.0)
Monocytes Absolute: 1.4 10*3/uL — ABNORMAL HIGH (ref 0.1–1.0)
Monocytes Relative: 12 %
Neutro Abs: 6.8 10*3/uL (ref 1.7–7.7)
Neutrophils Relative %: 61 %
Platelets: 419 10*3/uL — ABNORMAL HIGH (ref 150–400)
RBC: 4.3 MIL/uL (ref 3.87–5.11)
RDW: 14 % (ref 11.5–15.5)
WBC: 11.2 10*3/uL — ABNORMAL HIGH (ref 4.0–10.5)
nRBC: 0 % (ref 0.0–0.2)

## 2021-09-24 LAB — LIPASE, BLOOD: Lipase: 42 U/L (ref 11–51)

## 2021-09-24 LAB — RESP PANEL BY RT-PCR (FLU A&B, COVID) ARPGX2
Influenza A by PCR: NEGATIVE
Influenza B by PCR: NEGATIVE
SARS Coronavirus 2 by RT PCR: NEGATIVE

## 2021-09-24 LAB — HEPATIC FUNCTION PANEL
ALT: 19 U/L (ref 0–44)
AST: 23 U/L (ref 15–41)
Albumin: 3.2 g/dL — ABNORMAL LOW (ref 3.5–5.0)
Alkaline Phosphatase: 85 U/L (ref 38–126)
Bilirubin, Direct: 0.1 mg/dL (ref 0.0–0.2)
Indirect Bilirubin: 0.8 mg/dL (ref 0.3–0.9)
Total Bilirubin: 0.9 mg/dL (ref 0.3–1.2)
Total Protein: 6.3 g/dL — ABNORMAL LOW (ref 6.5–8.1)

## 2021-09-24 LAB — URINALYSIS, ROUTINE W REFLEX MICROSCOPIC
Bilirubin Urine: NEGATIVE
Glucose, UA: NEGATIVE mg/dL
Hgb urine dipstick: NEGATIVE
Ketones, ur: NEGATIVE mg/dL
Leukocytes,Ua: NEGATIVE
Nitrite: NEGATIVE
Protein, ur: NEGATIVE mg/dL
Specific Gravity, Urine: 1.015 (ref 1.005–1.030)
pH: 5.5 (ref 5.0–8.0)

## 2021-09-24 LAB — LACTIC ACID, PLASMA: Lactic Acid, Venous: 1.4 mmol/L (ref 0.5–1.9)

## 2021-09-24 MED ORDER — ENOXAPARIN SODIUM 60 MG/0.6ML IJ SOSY
50.0000 mg | PREFILLED_SYRINGE | INTRAMUSCULAR | Status: DC
  Administered 2021-09-24: 50 mg via SUBCUTANEOUS
  Filled 2021-09-24: qty 0.6

## 2021-09-24 MED ORDER — POTASSIUM CHLORIDE IN NACL 40-0.9 MEQ/L-% IV SOLN: INTRAVENOUS | Status: DC

## 2021-09-24 MED ORDER — HYDROCODONE-ACETAMINOPHEN 5-325 MG PO TABS
1.0000 | ORAL_TABLET | Freq: Four times a day (QID) | ORAL | Status: DC | PRN
  Administered 2021-09-25 – 2021-09-29 (×9): 1 via ORAL
  Filled 2021-09-24 (×10): qty 1

## 2021-09-24 MED ORDER — FOLIC ACID 5 MG/ML IJ SOLN
1.0000 mg | Freq: Every day | INTRAMUSCULAR | Status: DC
  Administered 2021-09-24 – 2021-09-29 (×6): 1 mg via INTRAVENOUS
  Filled 2021-09-24 (×7): qty 0.2

## 2021-09-24 MED ORDER — METOPROLOL SUCCINATE ER 25 MG PO TB24
25.0000 mg | ORAL_TABLET | Freq: Every day | ORAL | Status: DC
  Administered 2021-09-26 – 2021-09-29 (×4): 25 mg via ORAL
  Filled 2021-09-24 (×5): qty 1

## 2021-09-24 MED ORDER — SODIUM CHLORIDE 0.9 % IV SOLN
100.0000 mg | Freq: Two times a day (BID) | INTRAVENOUS | Status: AC
  Administered 2021-09-24 – 2021-09-27 (×7): 100 mg via INTRAVENOUS
  Filled 2021-09-24 (×10): qty 100

## 2021-09-24 MED ORDER — SODIUM CHLORIDE 0.9 % IV BOLUS: 500.0000 mL | Freq: Once | INTRAVENOUS | Status: DC

## 2021-09-24 MED ORDER — CALCITRIOL 0.25 MCG PO CAPS
0.2500 ug | ORAL_CAPSULE | ORAL | Status: DC
  Administered 2021-09-26 – 2021-09-28 (×2): 0.25 ug via ORAL
  Filled 2021-09-24 (×2): qty 1

## 2021-09-24 MED ORDER — SODIUM CHLORIDE 0.9 % IV BOLUS
1000.0000 mL | Freq: Once | INTRAVENOUS | Status: AC
  Administered 2021-09-24: 1000 mL via INTRAVENOUS

## 2021-09-24 MED ORDER — PANTOPRAZOLE SODIUM 40 MG IV SOLR
40.0000 mg | Freq: Two times a day (BID) | INTRAVENOUS | Status: DC
Start: 2021-09-24 — End: 2021-09-29
  Administered 2021-09-24 – 2021-09-29 (×10): 40 mg via INTRAVENOUS
  Filled 2021-09-24 (×10): qty 40

## 2021-09-24 MED ORDER — ONDANSETRON HCL 4 MG/2ML IJ SOLN
4.0000 mg | Freq: Once | INTRAMUSCULAR | Status: AC
  Administered 2021-09-24: 4 mg via INTRAVENOUS
  Filled 2021-09-24: qty 2

## 2021-09-24 MED ORDER — ATORVASTATIN CALCIUM 20 MG PO TABS
20.0000 mg | ORAL_TABLET | Freq: Every day | ORAL | Status: DC
  Administered 2021-09-24 – 2021-09-29 (×6): 20 mg via ORAL
  Filled 2021-09-24 (×6): qty 1

## 2021-09-24 MED ORDER — ONDANSETRON HCL 4 MG PO TABS
4.0000 mg | ORAL_TABLET | Freq: Four times a day (QID) | ORAL | Status: DC | PRN
  Administered 2021-09-27: 4 mg via ORAL
  Filled 2021-09-24: qty 1

## 2021-09-24 MED ORDER — ALPRAZOLAM 0.25 MG PO TABS
0.2500 mg | ORAL_TABLET | Freq: Every day | ORAL | Status: DC | PRN
  Administered 2021-09-25 – 2021-09-29 (×8): 0.25 mg via ORAL
  Filled 2021-09-24 (×9): qty 1

## 2021-09-24 MED ORDER — THIAMINE HCL 100 MG/ML IJ SOLN
100.0000 mg | Freq: Every day | INTRAMUSCULAR | Status: DC
  Administered 2021-09-24 – 2021-09-29 (×6): 100 mg via INTRAVENOUS
  Filled 2021-09-24 (×6): qty 2

## 2021-09-24 MED ORDER — TRAZODONE HCL 50 MG PO TABS
50.0000 mg | ORAL_TABLET | Freq: Every day | ORAL | Status: DC
  Administered 2021-09-24: 50 mg via ORAL
  Filled 2021-09-24: qty 1

## 2021-09-24 MED ORDER — ACETAMINOPHEN 325 MG PO TABS
650.0000 mg | ORAL_TABLET | Freq: Four times a day (QID) | ORAL | Status: DC | PRN
  Administered 2021-09-27 – 2021-09-29 (×3): 650 mg via ORAL
  Filled 2021-09-24 (×3): qty 2

## 2021-09-24 MED ORDER — PREDNISONE 5 MG PO TABS
9.0000 mg | ORAL_TABLET | Freq: Every day | ORAL | Status: DC
  Administered 2021-09-25 – 2021-09-29 (×5): 9 mg via ORAL
  Filled 2021-09-24 (×7): qty 4

## 2021-09-24 MED ORDER — ONDANSETRON HCL 4 MG/2ML IJ SOLN
4.0000 mg | Freq: Four times a day (QID) | INTRAMUSCULAR | Status: DC | PRN
  Administered 2021-09-24 – 2021-09-27 (×4): 4 mg via INTRAVENOUS
  Filled 2021-09-24 (×4): qty 2

## 2021-09-24 NOTE — ED Notes (Signed)
Rip Harbour, [facility] made aware of need for doxycycline from upstairs pharmacy

## 2021-09-24 NOTE — ED Provider Notes (Signed)
[facility] EMERGENCY DEPARTMENT Provider Note   CSN: [id number] Arrival date & time: 09/24/21  1510     History Chief Complaint  Patient presents with   Nausea   Abnormal Lab    Nicole [name] is a 75 y.o. female.  Patient was admitted on November 24th for nausea and diarrhea.  She was discharged on November 28 and since that time she is continued with nausea and not eating or drinking much.  She complains of weakness  The history is provided by the patient and medical records. No language interpreter was used.  Weakness Severity:  Moderate Onset quality:  Sudden Timing:  Constant Progression:  Worsening Chronicity:  New Context: not alcohol use   Relieved by:  Nothing Worsened by:  Nothing Ineffective treatments:  None tried Associated symptoms: nausea   Associated symptoms: no abdominal pain, no chest pain, no cough, no diarrhea, no frequency, no headaches and no seizures   Risk factors: no anemia       Past Medical History:  Diagnosis Date   AC (acromioclavicular) joint bone spurs    lt shoulder   Anemia    Arthritis    Carpal tunnel syndrome, bilateral    CHF (congestive heart failure) (HCC)    Collagen vascular disease ([place])    Gastroesophageal reflux    Headache    recent visit to ER @ [facility] for severe headache   Hypertension    Lumbar stenosis    Hx of ESIs by Dr. Nelva Bush   Polyarthralgia    Polymyalgia [facility])    Shingles    Spinal stenosis     Patient Active Problem List   Diagnosis Date Noted   Myositis 09/16/2021   Psoas abscess, left (2.0 cm on MRI)  09/16/2021   AKI (acute kidney injury) ([place]) 40/98/1191   Acute metabolic encephalopathy 47/82/9562   Hypokalemia 07/20/2021   Elevated MCV 07/20/2021   Mixed hyperlipidemia 07/20/2021   Diverticulitis 05/02/2018   Diverticulitis of colon 05/01/2018   Morbid obesity with BMI of 50.0-59.9, adult ([place]) 05/01/2018   Chronic diastolic HF (heart failure) ([place]) 05/01/2018   On prednisone therapy  05/31/2017   Positive anti-CCP test 05/31/2017   Rheumatoid factor positive 05/31/2017   Memory loss 04/04/2017   Polymyalgia rheumatica ([place]) 04/04/2017   High risk medication use 12/07/2016   DDD (degenerative disc disease), lumbar 09/27/2016   HTN (hypertension) 06/05/2016   Chronic pain syndrome 06/05/2016   Acute diverticulitis 06/02/2016   Diverticulitis large intestine 06/02/2016   Spinal stenosis of lumbosacral region 02/29/2016   Joint pain 02/29/2016   Endometrial polyp 02/06/2014   Postmenopausal bleeding 01/30/2014   GERD (gastroesophageal reflux disease) 09/29/2013   Spinal stenosis, thoracic 09/29/2013   Shingles 09/29/2013   Thoracic or lumbosacral neuritis or radiculitis, unspecified 05/04/2011   Abnormality of gait 05/04/2011   Muscle weakness (generalized) 05/04/2011   Bilateral primary osteoarthritis of knee 04/07/2009   KNEE PAIN 04/07/2009    Past Surgical History:  Procedure Laterality Date   BACK SURGERY  07/05/2018, 07/2018   x2    BIOPSY  09/22/2020   Procedure: BIOPSY;  Surgeon: Rogene Houston, MD;  Location: [facility];  Service: Endoscopy;;   CHOLECYSTECTOMY     COLONOSCOPY  06/11/2012   Procedure: COLONOSCOPY;  Surgeon: Jamesetta So, MD;  Location: [facility];  Service: Gastroenterology;  Laterality: N/A;   COLONOSCOPY WITH PROPOFOL N/A 09/22/2020   Procedure: COLONOSCOPY WITH PROPOFOL;  Surgeon: Rogene Houston, MD;  Location: [facility]  ENDO SUITE;  Service: Endoscopy;  Laterality: N/A;  730   HYSTEROSCOPY WITH D & C N/A 02/25/2014   Procedure: DILATATION AND CURETTAGE /HYSTEROSCOPY;  Surgeon: Florian Buff, MD;  Location: [facility] ORS;  Service: Gynecology;  Laterality: N/A;   POLYPECTOMY N/A 02/25/2014   Procedure: POLYPECTOMY;  Surgeon: Florian Buff, MD;  Location: [facility] ORS;  Service: Gynecology;  Laterality: N/A;   RESECTION DISTAL CLAVICAL Right 03/26/2015   Procedure: OPEN DISTAL CLAVICAL RESECTION ;  Surgeon: Netta Cedars, MD;  Location: [place];   Service: Orthopedics;  Laterality: Right;     OB History     Gravida  2   Para      Term      Preterm      AB      Living  2      SAB      IAB      Ectopic      Multiple      Live Births              Family History  Problem Relation Age of Onset   Hypertension Mother    Pneumonia Father    Arthritis Sister    Diabetes Paternal Grandmother     Social History   Tobacco Use   Smoking status: Never   Smokeless tobacco: Never  Vaping Use   Vaping Use: Never used  Substance Use Topics   Alcohol use: No   Drug use: No    Home Medications Prior to Admission medications   Medication Sig Start Date End Date Taking? Authorizing Provider  acetaminophen (TYLENOL) 325 MG tablet Take 2 tablets (650 mg total) by mouth every 6 (six) hours as needed for mild pain or headache (or Fever >/= 101). 05/06/18   Barton Dubois, MD  ALPRAZolam Duanne Moron) 0.25 MG tablet Take 0.25 mg by mouth daily as needed. 09/22/21   [provider]  amoxicillin-clavulanate (AUGMENTIN) 875-125 MG tablet Take 1 tablet by mouth 2 (two) times daily. 09/23/21   [provider]  atorvastatin (LIPITOR) 20 MG tablet Take 20 mg by mouth daily. 01/13/21   [provider]  calcitRIOL (ROCALTROL) 0.25 MCG capsule Take 0.25 mcg by mouth See admin instructions. Take 0.25 every Mon, Wed, Fri. 09/06/20   [provider]  cyclobenzaprine (FLEXERIL) 5 MG tablet Take 5 mg by mouth every 12 (twelve) hours as needed for muscle spasms.  02/20/20   [provider]  diclofenac Sodium (VOLTAREN) 1 % GEL Apply 2 g topically 4 (four) times daily as needed (pain).     [provider]  doxycycline (VIBRAMYCIN) 100 MG capsule Take 1 capsule (100 mg total) by mouth 2 (two) times daily for 7 days. 09/19/21 09/26/21  Johnson, Clanford L, MD  leflunomide (ARAVA) 10 MG tablet TAKE (1) TABLET BY MOUTH DAILY. Patient taking differently: Take 10 mg by mouth daily. 12/27/20   Ofilia Neas, PA-C  lidocaine (LIDODERM) 5 % Place 1-2 patches onto the skin every 12 (twelve) hours as needed (pain).  Patient not taking: Reported on 09/15/2021 02/19/20   [provider]  metolazone (ZAROXOLYN) 5 MG tablet Take 5 mg by mouth 2 (two) times a week. Tuesday and Fridays    [provider]  metoprolol succinate (TOPROL XL) 25 MG 24 hr tablet Take 1 tablet (25 mg total) by mouth daily. 07/21/21 07/21/22  Roxan Hockey, MD  Multiple Vitamins-Minerals (MULTIVITAMIN PO) Take 1 tablet by mouth daily.  [provider]  NUCYNTA ER 200 MG TB12 SMARTSIG:1 Tablet(s) By Mouth Every 12 Hours 09/14/21   [provider]  ondansetron (ZOFRAN) 4 MG tablet Take by mouth. 09/22/21   [provider]  pantoprazole (PROTONIX) 40 MG tablet Take 1 tablet (40 mg total) by mouth daily. 09/19/21   Johnson, Clanford L, MD  potassium chloride (KLOR-CON) 10 MEQ tablet Take 10 mEq by mouth daily. 09/06/20   [provider]  predniSONE (DELTASONE) 1 MG tablet Take 9 tablets (9 mg total) by mouth daily with breakfast. 07/22/21   Denton Brick, [place], MD  Simethicone (GAS-X PO) Take 1 tablet by mouth 3 (three) times daily as needed (flatulence).     [provider]  Torsemide 40 MG TABS Take 40 mg by mouth 2 (two) times daily. 09/21/21   Johnson, Clanford L, MD  traZODone (DESYREL) 50 MG tablet Take by mouth. 09/22/21   [provider]    Allergies    Oxycontin [oxycodone hcl], Sulfa antibiotics, Tramadol, Penicillins, and Sulfasalazine  Review of Systems   Review of Systems  Constitutional:  Negative for appetite change and fatigue.  HENT:  Negative for congestion, ear discharge and sinus pressure.   Eyes:  Negative for discharge.  Respiratory:  Negative for cough.   Cardiovascular:  Negative for chest pain.  Gastrointestinal:  Positive for nausea. Negative for abdominal pain and diarrhea.  Genitourinary:  Negative for frequency and hematuria.   Musculoskeletal:  Negative for back pain.  Skin:  Negative for rash.  Neurological:  Positive for weakness. Negative for seizures and headaches.  Psychiatric/Behavioral:  Negative for hallucinations.    Physical Exam Updated Vital Signs BP 116/84   Pulse 88   Temp 100 F (37.8 C) (Rectal)   Resp 20   SpO2 94%   Physical Exam Vitals and nursing note reviewed.  Constitutional:      Appearance: She is well-developed.  HENT:     Head: Normocephalic.     Comments: Dry mucous membrane    Nose: Nose normal.  Eyes:     General: No scleral icterus.    Conjunctiva/sclera: Conjunctivae normal.  Neck:     Thyroid: No thyromegaly.  Cardiovascular:     Rate and Rhythm: Normal rate and regular rhythm.     Heart sounds: No murmur heard.   No friction rub. No gallop.  Pulmonary:     Breath sounds: No stridor. No wheezing or rales.  Chest:     Chest wall: No tenderness.  Abdominal:     General: There is no distension.     Tenderness: There is no abdominal tenderness. There is no rebound.  Musculoskeletal:        General: Normal range of motion.     Cervical back: Neck supple.  Lymphadenopathy:     Cervical: No cervical adenopathy.  Skin:    Findings: No erythema or rash.  Neurological:     Mental Status: She is alert and oriented to person, place, and time.     Motor: No abnormal muscle tone.     Coordination: Coordination normal.  Psychiatric:        Behavior: Behavior normal.    ED Results / Procedures / Treatments   Labs (all labs ordered are listed, but only abnormal results are displayed) Labs Reviewed  BASIC METABOLIC PANEL - Abnormal; Notable for the following components:      Result Value   Sodium 132 (*)    Potassium 3.0 (*)    Chloride 92 (*)  CO2 20 (*)    Glucose, Bld 100 (*)    BUN 58 (*)    Creatinine, Ser 4.37 (*)    Calcium 7.6 (*)    GFR, Estimated 10 (*)    Anion gap 20 (*)    All other components within normal limits  URINALYSIS, ROUTINE W  REFLEX MICROSCOPIC - Abnormal; Notable for the following components:   APPearance CLOUDY (*)    All other components within normal limits  CBC WITH DIFFERENTIAL/PLATELET - Abnormal; Notable for the following components:   WBC 11.2 (*)    Platelets 419 (*)    Monocytes Absolute 1.4 (*)    All other components within normal limits  HEPATIC FUNCTION PANEL - Abnormal; Notable for the following components:   Total Protein 6.3 (*)    Albumin 3.2 (*)    All other components within normal limits  RESP PANEL BY RT-PCR (FLU A&B, COVID) ARPGX2  LIPASE, BLOOD  LACTIC ACID, PLASMA    EKG None  Radiology CT ABDOMEN PELVIS WO CONTRAST  Result Date: 09/24/2021 CLINICAL DATA:  Abdominal abscess. EXAM: CT ABDOMEN AND PELVIS WITHOUT CONTRAST TECHNIQUE: Multidetector CT imaging of the abdomen and pelvis was performed following the standard protocol without IV contrast. COMPARISON:  September 15, 2021 FINDINGS: Lower chest: Atelectasis versus scarring in the left lung base. Calcific atherosclerotic disease of the aorta and coronary arteries. Hepatobiliary: No focal liver abnormality is seen. Status post cholecystectomy. No biliary dilatation. Pancreas: Unremarkable. No pancreatic ductal dilatation or surrounding inflammatory changes. Spleen: Normal in size without focal abnormality. Adrenals/Urinary Tract: Adrenal glands are unremarkable. Kidneys are normal, without renal calculi, focal lesion, or hydronephrosis. Bladder is unremarkable. Stomach/Bowel: Stomach is within normal limits. Appendix appears normal. No evidence of bowel wall thickening, distention, or inflammatory changes. Vascular/Lymphatic: Aortic atherosclerosis. No enlarged abdominal or pelvic lymph nodes. Reproductive: Stable partially calcified left pelvic mass. Normal appearance of the right ovary and uterus. Other: No abdominal wall hernia or abnormality. No abdominopelvic ascites. Musculoskeletal: Marked arthritic changes of the lumbosacral  spine. Scoliosis. Prior lumbar spine decompensation. Decreased in size gas and fluid collection adjacent to left L4-L5 disc space and extending into the left psoas muscle. Prominent left-sided L4-L5 disc bulge. IMPRESSION: 1. Decreased in size known gas and fluid collection adjacent to left L4-L5 disc space, extending into the left psoas muscle. 2. Stable partially calcified left pelvic mass. 3. Calcific atherosclerotic disease of the aorta and coronary arteries. Aortic Atherosclerosis (ICD10-I70.0). Electronically Signed   By: Fidela Salisbury M.D.   On: 09/24/2021 18:13    Procedures Procedures   Medications Ordered in ED Medications  ondansetron (ZOFRAN) injection 4 mg (4 mg Intravenous Given 09/24/21 1617)  sodium chloride 0.9 % bolus 1,000 mL (1,000 mLs Intravenous New Bag/Given 09/24/21 1650)   CRITICAL CARE Performed by: Milton Ferguson Total critical care time: 40 minutes Critical care time was exclusive of separately billable procedures and treating other patients. Critical care was necessary to treat or prevent imminent or life-threatening deterioration. Critical care was time spent personally by me on the following activities: development of treatment plan with patient and/or surrogate as well as nursing, discussions with consultants, evaluation of patient's response to treatment, examination of patient, obtaining history from patient or surrogate, ordering and performing treatments and interventions, ordering and review of laboratory studies, ordering and review of radiographic studies, pulse oximetry and re-evaluation of patient's condition.  ED Course  I have reviewed the triage vital signs and the nursing notes.  Pertinent labs & imaging  results that were available during my care of the patient were reviewed by me and considered in my medical decision making (see chart for details).    MDM Rules/Calculators/A&P                           Patient with severe AKI and persistent  nausea.  She will be admitted to medicine Final Clinical Impression(s) / ED Diagnoses Final diagnoses:  AKI (acute kidney injury) [facility])    Rx / [place] Orders ED Discharge Orders     None        Milton Ferguson, MD 09/24/21 [place]

## 2021-09-24 NOTE — ED Notes (Signed)
2 unsuccessful IV attempts.

## 2021-09-24 NOTE — ED Triage Notes (Signed)
BIB EMS from [facility]. Was recently discharged from hospital after having abd pain and low potassium.  Routine labs showed K 2.7 today.  Patient still complaining of nausea, emesis and abd pain/hip pain.  Patient moaning upon arrival.

## 2021-09-24 NOTE — ED Notes (Signed)
Pt c/o nausea- pt coughing and spitting up- no active vomiting.

## 2021-09-24 NOTE — H&P (Signed)
History and Physical  Nicole [name] EHO:[id number] DOB: 1946-07-29 DOA: 09/24/2021  Referring physician: Dr Dewayne Hatch, ED physician PCP: Jake Samples, PA-C  Outpatient Specialists:   Patient Coming From: [facility]  Chief Complaint: Failure to thrive, abnormal labs  HPI: Nicole [name] is a 75 y.o. female with a history of polymyalgia rheumatica with positive Rh factor and on chronic steroids, hypertension, obesity, chronic diastolic heart failure.  Patient recently admitted from 11/24 until 11/28 with nausea, abdominal pain, back pain.  She was found to have a 2 cm psoas abscess.  She was started on antibiotics and had improvement of her inflammatory numbers.  She was discharged on oral doxycycline to finish out a 7-day course which will complete next Monday.  Since that discharge, she has been at the [facility] and has had difficulty eating and drinking.  She puts the food in her mouth but is unable to swallow the food.  She does get quite nauseated and vomits.  They discussed the condition with her PCP who gave her antinausea medications which were not helpful.  They also put her on an acid reducer, tried to get her to drink boost.  The assisted living facility got blood work today which showed some abnormalities and the patient was brought to the hospital for evaluation.  She does have some mild epigastric abdominal pain that is nonradiating and without palliating or provoking factors.  Emergency Department Course: Potassium 3.0, creatinine 4.37.  LFTs normal.  Lactic acid 1.4.  White count 11.2.  Review of Systems:   Pt denies any fevers, chills, diarrhea, constipation, shortness of breath, dyspnea on exertion, orthopnea, cough, wheezing, palpitations, headache, vision changes, lightheadedness, dizziness, melena, rectal bleeding.  Review of systems are otherwise negative  Past Medical History:  Diagnosis Date   AC (acromioclavicular) joint bone spurs    lt shoulder   Anemia     Arthritis    Carpal tunnel syndrome, bilateral    CHF (congestive heart failure) (HCC)    Collagen vascular disease (HCC)    Gastroesophageal reflux    Headache    recent visit to ER @ [facility] for severe headache   Hypertension    Lumbar stenosis    Hx of ESIs by Dr. Nelva Bush   Polyarthralgia    Polymyalgia [facility])    Shingles    Spinal stenosis    Past Surgical History:  Procedure Laterality Date   BACK SURGERY  07/05/2018, 07/2018   x2    BIOPSY  09/22/2020   Procedure: BIOPSY;  Surgeon: Rogene Houston, MD;  [facility];  Service: Endoscopy;;   CHOLECYSTECTOMY     COLONOSCOPY  06/11/2012   Procedure: COLONOSCOPY;  Surgeon: Jamesetta So, MD;  Location[facility];  Service: Gastroenterology;  Laterality: N/A;   COLONOSCOPY WITH PROPOFOL N/A 09/22/2020   Procedure: COLONOSCOPY WITH PROPOFOL;  Surgeon: Rogene Houston, MD;  Location: [facility];  Service: Endoscopy;  Laterality: N/A;  730   HYSTEROSCOPY WITH D & C N/A 02/25/2014   Procedure: DILATATION AND CURETTAGE /HYSTEROSCOPY;  Surgeon: Florian Buff, MD;  Location: [facility] ORS;  Service: Gynecology;  Laterality: N/A;   POLYPECTOMY N/A 02/25/2014   Procedure: POLYPECTOMY;  Surgeon: Florian Buff, MD;  Location: [facility];  Service: Gynecology;  Laterality: N/A;   RESECTION DISTAL CLAVICAL Right 03/26/2015   Procedure: OPEN DISTAL CLAVICAL RESECTION ;  Surgeon: Netta Cedars, MD;  Location: [place];  Service: Orthopedics;  Laterality: Right;   Social  History:  reports that she has never smoked. She has never used smokeless tobacco. She reports that she does not drink alcohol and does not use drugs. Patient lives at home  Allergies  Allergen Reactions   Oxycontin [Oxycodone Hcl] Swelling    Pt tolerates hydromorphone.   Sulfa Antibiotics Other (See Comments)    Sores   Tramadol     "Makes me feel like I am falling"   Penicillins Rash   Sulfasalazine Other (See Comments)    Sores    Family History  Problem Relation  Age of Onset   Hypertension Mother    Pneumonia Father    Arthritis Sister    Diabetes Paternal Grandmother       Prior to Admission medications   Medication Sig Start Date End Date Taking? Authorizing Provider  acetaminophen (TYLENOL) 325 MG tablet Take 2 tablets (650 mg total) by mouth every 6 (six) hours as needed for mild pain or headache (or Fever >/= 101). 05/06/18   Barton Dubois, MD  ALPRAZolam Duanne Moron) 0.25 MG tablet Take 0.25 mg by mouth daily as needed. 09/22/21   [provider]  amoxicillin-clavulanate (AUGMENTIN) 875-125 MG tablet Take 1 tablet by mouth 2 (two) times daily. 09/23/21   [provider]  atorvastatin (LIPITOR) 20 MG tablet Take 20 mg by mouth daily. 01/13/21   [provider]  calcitRIOL (ROCALTROL) 0.25 MCG capsule Take 0.25 mcg by mouth See admin instructions. Take 0.25 every Mon, Wed, Fri. 09/06/20   [provider]  cyclobenzaprine (FLEXERIL) 5 MG tablet Take 5 mg by mouth every 12 (twelve) hours as needed for muscle spasms.  02/20/20   [provider]  diclofenac Sodium (VOLTAREN) 1 % GEL Apply 2 g topically 4 (four) times daily as needed (pain).     [provider]  doxycycline (VIBRAMYCIN) 100 MG capsule Take 1 capsule (100 mg total) by mouth 2 (two) times daily for 7 days. 09/19/21 09/26/21  Johnson, Clanford L, MD  leflunomide (ARAVA) 10 MG tablet TAKE (1) TABLET BY MOUTH DAILY. Patient taking differently: Take 10 mg by mouth daily. 12/27/20   Ofilia Neas, PA-C  lidocaine (LIDODERM) 5 % Place 1-2 patches onto the skin every 12 (twelve) hours as needed (pain).  Patient not taking: Reported on 09/15/2021 02/19/20   [provider]  metolazone (ZAROXOLYN) 5 MG tablet Take 5 mg by mouth 2 (two) times a week. Tuesday and Fridays    [provider]  metoprolol succinate (TOPROL XL) 25 MG 24 hr tablet Take 1 tablet (25 mg total) by mouth daily. 07/21/21 07/21/22  Roxan Hockey, MD  Multiple  Vitamins-Minerals (MULTIVITAMIN PO) Take 1 tablet by mouth daily.     [provider]  NUCYNTA ER 200 MG TB12 SMARTSIG:1 Tablet(s) By Mouth Every 12 Hours 09/14/21   [provider]  ondansetron (ZOFRAN) 4 MG tablet Take by mouth. 09/22/21   [provider]  pantoprazole (PROTONIX) 40 MG tablet Take 1 tablet (40 mg total) by mouth daily. 09/19/21   Johnson, Clanford L, MD  potassium chloride (KLOR-CON) 10 MEQ tablet Take 10 mEq by mouth daily. 09/06/20   [provider]  predniSONE (DELTASONE) 1 MG tablet Take 9 tablets (9 mg total) by mouth daily with breakfast. 07/22/21   Denton Brick, Courage, MD  Simethicone (GAS-X PO) Take 1 tablet by mouth 3 (three) times daily as needed (flatulence).     [provider]  Torsemide 40 MG TABS Take 40 mg by mouth 2 (  two) times daily. 09/21/21   Johnson, Clanford L, MD  traZODone (DESYREL) 50 MG tablet Take by mouth. 09/22/21   [provider]    Physical Exam: BP 116/84   Pulse 88   Temp 98.2 F (36.8 C) (Oral)   Resp 20   SpO2 94%   General: Elderly female. Awake and alert and oriented x3. No acute cardiopulmonary distress.  HEENT: Normocephalic atraumatic.  Right and left ears normal in appearance.  Pupils equal, round, reactive to light. Extraocular muscles are intact. Sclerae anicteric and noninjected.  Moist mucosal membranes. No mucosal lesions.  Neck: Neck supple without lymphadenopathy. No carotid bruits. No masses palpated.  Cardiovascular: Regular rate with normal S1-S2 sounds. No murmurs, rubs, gallops auscultated. No JVD.  Respiratory: Good respiratory effort with no wheezes, rales, rhonchi. Lungs clear to auscultation bilaterally.  No accessory muscle use. Abdomen: Soft, epigastric tenderness without guarding or rebound. Nondistended. Active bowel sounds. No masses or hepatosplenomegaly  Skin: No rashes, lesions, or ulcerations.  Dry, warm to touch. 2+ dorsalis pedis and radial  pulses. Musculoskeletal: No calf or leg pain. All major joints not erythematous nontender.  No upper or lower joint deformation.  Good ROM.  No contractures  Psychiatric: Intact judgment and insight. Pleasant and cooperative. Neurologic: No focal neurological deficits. Strength is 5/5 and symmetric in upper and lower extremities.  Cranial nerves II through XII are grossly intact.           Labs on Admission: I have personally reviewed following labs and imaging studies  CBC: Recent Labs  Lab 09/18/21 0503 09/24/21 1611  WBC 7.1 11.2*  NEUTROABS  --  6.8  HGB 11.1* 13.7  HCT 34.8* 39.5  MCV 97.8 91.9  PLT 328 470*   Basic Metabolic Panel: Recent Labs  Lab 09/18/21 0503 09/19/21 0355 09/24/21 1611  NA 140  --  132*  K 3.4*  --  3.0*  CL 108  --  92*  CO2 24  --  20*  GLUCOSE 81  --  100*  BUN 10  --  58*  CREATININE 1.05* 0.96 4.37*  CALCIUM 8.5*  --  7.6*   GFR: Estimated Creatinine Clearance: 12.7 mL/min (A) (by C-G formula based on SCr of 4.37 mg/dL (H)). Liver Function Tests: Recent Labs  Lab 09/24/21 1746  AST 23  ALT 19  ALKPHOS 85  BILITOT 0.9  PROT 6.3*  ALBUMIN 3.2*   Recent Labs  Lab 09/24/21 1746  LIPASE 42   No results for input(s): AMMONIA in the last 168 hours. Coagulation Profile: No results for input(s): INR, PROTIME in the last 168 hours. Cardiac Enzymes: No results for input(s): CKTOTAL, CKMB, CKMBINDEX, TROPONINI in the last 168 hours. BNP (last 3 results) No results for input(s): PROBNP in the last 8760 hours. HbA1C: No results for input(s): HGBA1C in the last 72 hours. CBG: No results for input(s): GLUCAP in the last 168 hours. Lipid Profile: No results for input(s): CHOL, HDL, LDLCALC, TRIG, CHOLHDL, LDLDIRECT in the last 72 hours. Thyroid Function Tests: No results for input(s): TSH, T4TOTAL, FREET4, T3FREE, THYROIDAB in the last 72 hours. Anemia Panel: No results for input(s): VITAMINB12, FOLATE, FERRITIN, TIBC, IRON,  RETICCTPCT in the last 72 hours. Urine analysis:    Component Value Date/Time   COLORURINE YELLOW 09/24/2021 1646   APPEARANCEUR CLOUDY (A) 09/24/2021 1646   LABSPEC 1.015 09/24/2021 1646   PHURINE 5.5 09/24/2021 1646   GLUCOSEU NEGATIVE 09/24/2021 1646   HGBUR NEGATIVE 09/24/2021 1646  BILIRUBINUR NEGATIVE 09/24/2021 1646   BILIRUBINUR negative 02/14/2020 [place] 09/24/2021 1646   PROTEINUR NEGATIVE 09/24/2021 1646   UROBILINOGEN 0.2 02/14/2020 1328   UROBILINOGEN 0.2 02/20/2014 1116   NITRITE NEGATIVE 09/24/2021 1646   LEUKOCYTESUR NEGATIVE 09/24/2021 1646   Sepsis Labs: @LABRCNTIP (procalcitonin:4,lacticidven:4) ) Recent Results (from the past 240 hour(s))  Resp Panel by RT-PCR (Flu A&B, Covid) Nasopharyngeal Swab     Status: None   Collection Time: 09/15/21 11:08 AM   Specimen: Nasopharyngeal Swab; Nasopharyngeal(NP) swabs in vial transport medium  Result Value Ref Range Status   SARS Coronavirus 2 by RT PCR NEGATIVE NEGATIVE Final    Comment: (NOTE) SARS-CoV-2 target nucleic acids are NOT DETECTED.  The SARS-CoV-2 RNA is generally detectable in upper respiratory specimens during the acute phase of infection. The lowest concentration of SARS-CoV-2 viral copies this assay can detect is 138 copies/mL. A negative result does not preclude SARS-Cov-2 infection and should not be used as the sole basis for treatment or other patient management decisions. A negative result may occur with  improper specimen collection/handling, submission of specimen other than nasopharyngeal swab, presence of viral mutation(s) within the areas targeted by this assay, and inadequate number of viral copies(<138 copies/mL). A negative result must be combined with clinical observations, patient history, and epidemiological information. The expected result is Negative.  Fact Sheet for Patients:  EntrepreneurPulse.com.au  Fact Sheet for Healthcare Providers:   IncredibleEmployment.be  This test is no t yet approved or cleared by the [place] FDA and  has been authorized for detection and/or diagnosis of SARS-CoV-2 by FDA under an Emergency Use Authorization (EUA). This EUA will remain  in effect (meaning this test can be used) for the duration of the COVID-19 declaration under Section 564(b)(1) of the Act, 21 U.S.C.section 360bbb-3(b)(1), unless the authorization is terminated  or revoked sooner.       Influenza A by PCR NEGATIVE NEGATIVE Final   Influenza B by PCR NEGATIVE NEGATIVE Final    Comment: (NOTE) The Xpert Xpress SARS-CoV-2/FLU/RSV plus assay is intended as an aid in the diagnosis of influenza from Nasopharyngeal swab specimens and should not be used as a sole basis for treatment. Nasal washings and aspirates are unacceptable for Xpert Xpress SARS-CoV-2/FLU/RSV testing.  Fact Sheet for Patients: EntrepreneurPulse.com.au  Fact Sheet for Healthcare Providers: IncredibleEmployment.be  This test is not yet approved or cleared by the [place] FDA and has been authorized for detection and/or diagnosis of SARS-CoV-2 by FDA under an Emergency Use Authorization (EUA). This EUA will remain in effect (meaning this test can be used) for the duration of the COVID-19 declaration under Section 564(b)(1) of the Act, 21 U.S.C. section 360bbb-3(b)(1), unless the authorization is terminated or revoked.  Performed at [facility], [address], [place], [place] [address]   Culture, blood (routine x 2)     Status: None   Collection Time: 09/15/21  1:28 PM   Specimen: BLOOD RIGHT HAND  Result Value Ref Range Status   Specimen Description BLOOD RIGHT HAND  Final   Special Requests   Final    Blood Culture adequate volume BOTTLES DRAWN AEROBIC AND ANAEROBIC   Culture   Final    NO GROWTH 5 DAYS Performed at [facility], [address], [place], [place] [address]    Report  Status 09/20/2021 FINAL  Final  Culture, blood (routine x 2)     Status: None   Collection Time: 09/15/21  1:28 PM   Specimen: BLOOD  LEFT ARM  Result Value Ref Range Status   Specimen Description BLOOD LEFT ARM  Final   Special Requests   Final    Blood Culture adequate volume BOTTLES DRAWN AEROBIC AND ANAEROBIC   Culture   Final    NO GROWTH 5 DAYS Performed at [facility], [address], [place], [place] [address]    Report Status 09/20/2021 FINAL  Final  C Difficile Quick Screen w PCR reflex     Status: None   Collection Time: 09/16/21 12:57 PM   Specimen: STOOL  Result Value Ref Range Status   C Diff antigen NEGATIVE NEGATIVE Final   C Diff toxin NEGATIVE NEGATIVE Final   C Diff interpretation No C. difficile detected.  Final    Comment: Performed at [facility], [address] [place], [place] [address]  Resp Panel by RT-PCR (Flu A&B, Covid) Nasopharyngeal Swab     Status: None   Collection Time: 09/24/21  4:20 PM   Specimen: Nasopharyngeal Swab; Nasopharyngeal(NP) swabs in vial transport medium  Result Value Ref Range Status   SARS Coronavirus 2 by RT PCR NEGATIVE NEGATIVE Final    Comment: (NOTE) SARS-CoV-2 target nucleic acids are NOT DETECTED.  The SARS-CoV-2 RNA is generally detectable in upper respiratory specimens during the acute phase of infection. The lowest concentration of SARS-CoV-2 viral copies this assay can detect is 138 copies/mL. A negative result does not preclude SARS-Cov-2 infection and should not be used as the sole basis for treatment or other patient management decisions. A negative result may occur with  improper specimen collection/handling, submission of specimen other than nasopharyngeal swab, presence of viral mutation(s) within the areas targeted by this assay, and inadequate number of viral copies(<138 copies/mL). A negative result must be combined with clinical observations, patient history, and epidemiological information. The expected  result is Negative.  Fact Sheet for Patients:  EntrepreneurPulse.com.au  Fact Sheet for Healthcare Providers:  IncredibleEmployment.be  This test is no t yet approved or cleared by the [place] FDA and  has been authorized for detection and/or diagnosis of SARS-CoV-2 by FDA under an Emergency Use Authorization (EUA). This EUA will remain  in effect (meaning this test can be used) for the duration of the COVID-19 declaration under Section 564(b)(1) of the Act, 21 U.S.C.section 360bbb-3(b)(1), unless the authorization is terminated  or revoked sooner.       Influenza A by PCR NEGATIVE NEGATIVE Final   Influenza B by PCR NEGATIVE NEGATIVE Final    Comment: (NOTE) The Xpert Xpress SARS-CoV-2/FLU/RSV plus assay is intended as an aid in the diagnosis of influenza from Nasopharyngeal swab specimens and should not be used as a sole basis for treatment. Nasal washings and aspirates are unacceptable for Xpert Xpress SARS-CoV-2/FLU/RSV testing.  Fact Sheet for Patients: EntrepreneurPulse.com.au  Fact Sheet for Healthcare Providers: IncredibleEmployment.be  This test is not yet approved or cleared by the [place] FDA and has been authorized for detection and/or diagnosis of SARS-CoV-2 by FDA under an Emergency Use Authorization (EUA). This EUA will remain in effect (meaning this test can be used) for the duration of the COVID-19 declaration under Section 564(b)(1) of the Act, 21 U.S.C. section 360bbb-3(b)(1), unless the authorization is terminated or revoked.  Performed at [facility], [address] [place], [place] [address]      Radiological Exams on Admission: CT ABDOMEN PELVIS WO CONTRAST  Result Date: 09/24/2021 CLINICAL DATA:  Abdominal abscess. EXAM: CT ABDOMEN AND PELVIS WITHOUT CONTRAST TECHNIQUE: Multidetector CT imaging of  the abdomen and pelvis was performed following the standard  protocol without IV contrast. COMPARISON:  September 15, 2021 FINDINGS: Lower chest: Atelectasis versus scarring in the left lung base. Calcific atherosclerotic disease of the aorta and coronary arteries. Hepatobiliary: No focal liver abnormality is seen. Status post cholecystectomy. No biliary dilatation. Pancreas: Unremarkable. No pancreatic ductal dilatation or surrounding inflammatory changes. Spleen: Normal in size without focal abnormality. Adrenals/Urinary Tract: Adrenal glands are unremarkable. Kidneys are normal, without renal calculi, focal lesion, or hydronephrosis. Bladder is unremarkable. Stomach/Bowel: Stomach is within normal limits. Appendix appears normal. No evidence of bowel wall thickening, distention, or inflammatory changes. Vascular/Lymphatic: Aortic atherosclerosis. No enlarged abdominal or pelvic lymph nodes. Reproductive: Stable partially calcified left pelvic mass. Normal appearance of the right ovary and uterus. Other: No abdominal wall hernia or abnormality. No abdominopelvic ascites. Musculoskeletal: Marked arthritic changes of the lumbosacral spine. Scoliosis. Prior lumbar spine decompensation. Decreased in size gas and fluid collection adjacent to left L4-L5 disc space and extending into the left psoas muscle. Prominent left-sided L4-L5 disc bulge. IMPRESSION: 1. Decreased in size known gas and fluid collection adjacent to left L4-L5 disc space, extending into the left psoas muscle. 2. Stable partially calcified left pelvic mass. 3. Calcific atherosclerotic disease of the aorta and coronary arteries. Aortic Atherosclerosis (ICD10-I70.0). Electronically Signed   By: Fidela Salisbury M.D.   On: 09/24/2021 18:13    EKG: Independently reviewed. Sinus rhythm, probable left old anterior infarct.  Left atrial enlargement.  No acute ST changes.  Assessment/Plan: Principal Problem:   Failure to thrive in adult Active Problems:   HTN (hypertension)   On prednisone therapy   Morbid  obesity with BMI of 50.0-59.9, adult (HCC)   Hypokalemia   AKI (acute kidney injury) ([place])   Psoas abscess, left (2.0 cm on MRI)     This patient was discussed with the ED physician, including pertinent vitals, physical exam findings, labs, and imaging.  We also discussed care given by the ED provider.  Failure to thrive in adult With significant electrolyte derangement and dysphagia Admits IV fluids IV potassium We will try boost protein drinks in between meals we will have speech therapy see the patient and evaluate for underlying problems. Hypokalemia IV fluids with potassium Check potassium in the morning Acute kidney injury Continue IV fluids Recheck creatinine in the morning Left psoas abscess Change doxycycline assisted living facility following admission IV CT scan shows improvement of abscess size Prednisone therapy Check a.m. cortisol level Hypertension Continue antihypertensives  DVT prophylaxis: Lovenox Consultants: none Code Status: Full code Family Communication: Son present during interview and exam Disposition Plan: Patient should return to assisted living facility following improvement   Truett Mainland, DO

## 2021-09-25 DIAGNOSIS — R627 Adult failure to thrive: Secondary | ICD-10-CM

## 2021-09-25 LAB — VITAMIN D 25 HYDROXY (VIT D DEFICIENCY, FRACTURES): Vit D, 25-Hydroxy: 59.12 ng/mL (ref 30–100)

## 2021-09-25 LAB — BASIC METABOLIC PANEL
Anion gap: 15 (ref 5–15)
Anion gap: 12 (ref 5–15)
BUN: 54 mg/dL — ABNORMAL HIGH (ref 8–23)
BUN: 50 mg/dL — ABNORMAL HIGH (ref 8–23)
CO2: 21 mmol/L — ABNORMAL LOW (ref 22–32)
CO2: 21 mmol/L — ABNORMAL LOW (ref 22–32)
Calcium: 7.1 mg/dL — ABNORMAL LOW (ref 8.9–10.3)
Calcium: 7.2 mg/dL — ABNORMAL LOW (ref 8.9–10.3)
Chloride: 102 mmol/L (ref 98–111)
Chloride: 102 mmol/L (ref 98–111)
Creatinine, Ser: 3.34 mg/dL — ABNORMAL HIGH (ref 0.44–1.00)
Creatinine, Ser: 2.54 mg/dL — ABNORMAL HIGH (ref 0.44–1.00)
GFR, Estimated: 14 mL/min — ABNORMAL LOW (ref >60)
GFR, Estimated: 19 mL/min — ABNORMAL LOW (ref >60)
Glucose, Bld: 79 mg/dL (ref 70–99)
Glucose, Bld: 97 mg/dL (ref 70–99)
Potassium: 2.3 mmol/L — CL (ref 3.5–5.1)
Potassium: 3.3 mmol/L — ABNORMAL LOW (ref 3.5–5.1)
Sodium: 138 mmol/L (ref 135–145)
Sodium: 135 mmol/L (ref 135–145)

## 2021-09-25 LAB — CBC
HCT: 35.5 % — ABNORMAL LOW (ref 36.0–46.0)
Hemoglobin: 12 g/dL (ref 12.0–15.0)
MCH: 31.5 pg (ref 26.0–34.0)
MCHC: 33.8 g/dL (ref 30.0–36.0)
MCV: 93.2 fL (ref 80.0–100.0)
Platelets: 345 10*3/uL (ref 150–400)
RBC: 3.81 MIL/uL — ABNORMAL LOW (ref 3.87–5.11)
RDW: 13.9 % (ref 11.5–15.5)
WBC: 8.6 10*3/uL (ref 4.0–10.5)
nRBC: 0 % (ref 0.0–0.2)

## 2021-09-25 LAB — CORTISOL-AM, BLOOD: Cortisol - AM: 21.9 ug/dL (ref 6.7–22.6)

## 2021-09-25 MED ORDER — BOOST PO LIQD
237.0000 mL | Freq: Three times a day (TID) | ORAL | Status: DC
  Administered 2021-09-25 – 2021-09-29 (×11): 237 mL via ORAL
  Filled 2021-09-25 (×16): qty 237

## 2021-09-25 MED ORDER — POTASSIUM CHLORIDE CRYS ER 20 MEQ PO TBCR
40.0000 meq | EXTENDED_RELEASE_TABLET | Freq: Once | ORAL | Status: AC
  Administered 2021-09-25: 08:00:00 40 meq via ORAL
  Filled 2021-09-25: qty 2

## 2021-09-25 MED ORDER — TAPENTADOL HCL ER 50 MG PO TB12
100.0000 mg | ORAL_TABLET | Freq: Two times a day (BID) | ORAL | Status: DC
  Administered 2021-09-25 – 2021-09-29 (×9): 100 mg via ORAL
  Filled 2021-09-25 (×9): qty 2

## 2021-09-25 MED ORDER — ENOXAPARIN SODIUM 30 MG/0.3ML IJ SOSY
30.0000 mg | PREFILLED_SYRINGE | INTRAMUSCULAR | Status: DC
  Administered 2021-09-25 – 2021-09-26 (×2): 30 mg via SUBCUTANEOUS
  Filled 2021-09-25 (×2): qty 0.3

## 2021-09-25 MED ORDER — CALCIUM GLUCONATE-NACL 1-0.675 GM/50ML-% IV SOLN
1.0000 g | Freq: Once | INTRAVENOUS | Status: AC
  Administered 2021-09-25: 08:00:00 1000 mg via INTRAVENOUS
  Filled 2021-09-25: qty 50

## 2021-09-25 MED ORDER — LEFLUNOMIDE 20 MG PO TABS
10.0000 mg | ORAL_TABLET | Freq: Every day | ORAL | Status: DC
  Administered 2021-09-25 – 2021-09-29 (×5): 10 mg via ORAL
  Filled 2021-09-25 (×7): qty 0.5

## 2021-09-25 MED ORDER — MELATONIN 3 MG PO TABS
3.0000 mg | ORAL_TABLET | Freq: Every day | ORAL | Status: DC
  Administered 2021-09-25 – 2021-09-28 (×4): 3 mg via ORAL
  Filled 2021-09-25 (×4): qty 1

## 2021-09-25 MED ORDER — POTASSIUM CHLORIDE 10 MEQ/100ML IV SOLN
10.0000 meq | INTRAVENOUS | Status: DC
  Administered 2021-09-25 (×3): 10 meq via INTRAVENOUS
  Filled 2021-09-25 (×3): qty 100

## 2021-09-25 MED ORDER — TRAZODONE HCL 50 MG PO TABS
25.0000 mg | ORAL_TABLET | Freq: Every day | ORAL | Status: DC
  Administered 2021-09-25 – 2021-09-28 (×4): 25 mg via ORAL
  Filled 2021-09-25 (×4): qty 1

## 2021-09-25 MED ORDER — POTASSIUM CHLORIDE 10 MEQ/100ML IV SOLN
10.0000 meq | INTRAVENOUS | Status: AC
  Administered 2021-09-25 (×3): 10 meq via INTRAVENOUS
  Filled 2021-09-25 (×3): qty 100

## 2021-09-25 MED ORDER — SODIUM CHLORIDE 0.9 % IV SOLN: INTRAVENOUS | Status: DC

## 2021-09-25 NOTE — Progress Notes (Signed)
PROGRESS NOTE    Nicole [name]  JFH:[id number] DOB: 13-Nov-1945 DOA: 09/24/2021 PCP: Jake Samples, PA-C   Brief Narrative:    Nicole [name] is a 75 y.o. female with a history of polymyalgia rheumatica with positive Rh factor and on chronic steroids, hypertension, obesity, chronic diastolic heart failure.  Patient recently admitted from 11/24 until 11/28 with nausea, abdominal pain, back pain.  She was found to have a 2 cm psoas abscess.  She was started on antibiotics and had improvement of her inflammatory numbers.  She was discharged on oral doxycycline to finish out a 7-day course which will complete next Monday.  Since that discharge, she has been at the [facility] and has had difficulty eating and drinking.  She continued to have some persistent nausea and vomiting with poor oral intake and developing dehydration and was admitted with the symptoms as well as AKI.  Assessment & Plan:   Principal Problem:   Failure to thrive in adult Active Problems:   HTN (hypertension)   On prednisone therapy   Morbid obesity with BMI of 50.0-59.9, adult (HCC)   Chronic diastolic HF (heart failure) (HCC)   Hypokalemia   AKI (acute kidney injury) ([place])   Psoas abscess, left (2.0 cm on MRI)    Intractable nausea and vomiting -Continue on Zofran as needed -Continue IV fluid  AKI-improving -Prerenal from poor oral intake along with nausea and vomiting -Continue IV fluids -Monitor creatinine  Severe hypokalemia -Replete IV and p.o. -Recheck BMP  Hypocalcemia -Replete and reevaluate -Check ionized calcium, PTH, and vitamin D levels  Left psoas abscess -Continue IV doxycycline day 5/7  Chronic prednisone -Continue  History of hypertension -BP soft this a.m., hold metoprolol   DVT prophylaxis: Lovenox Code Status: Full Family Communication: Tried calling daughter with no response 12/4 Disposition Plan:  Status is: Inpatient  Remains inpatient appropriate because: Continues  on IV fluids and meds.   Consultants:  None  Procedures:  See below  Antimicrobials:  Anti-infectives (From admission, onward)    Start     Dose/Rate Route Frequency Ordered Stop   09/24/21 1900  doxycycline (VIBRAMYCIN) 100 mg in sodium chloride 0.9 % 250 mL IVPB        100 mg 125 mL/hr over 120 Minutes Intravenous Every 12 hours 09/24/21 1846         Subjective: Patient seen and evaluated today with ongoing weakness and nausea. No further vomiting this am. Continues to have back pain and soft bp readings. No diarrhea.  Objective: Vitals:   09/24/21 2051 09/24/21 2057 09/25/21 0609 09/25/21 0830  BP: (!) 109/58  120/68 97/75  Pulse: 84  90 89  Resp: 20  19   Temp: 98.6 F (37 C)  98.1 F (36.7 C)   TempSrc: Oral  Oral   SpO2: 97% 97% 96%   Weight: 104.5 kg     Height: 5\' 1"  (1.549 m)       Intake/Output Summary (Last 24 hours) at 09/25/2021 0940 Last data filed at 09/25/2021 0300 Gross per 24 hour  Intake 1738.28 ml  Output --  Net 1738.28 ml   Filed Weights   09/24/21 2051  Weight: 104.5 kg    Examination:  General exam: Appears calm and comfortable, obese Respiratory system: Clear to auscultation. Respiratory effort normal. Cardiovascular system: S1 & S2 heard, RRR.  Gastrointestinal system: Abdomen is soft Central nervous system: Alert and awake Extremities: No edema Skin: No significant lesions noted Psychiatry: Flat affect.    Data  Reviewed: I have personally reviewed following labs and imaging studies  CBC: Recent Labs  Lab 09/24/21 1611 09/25/21 0508  WBC 11.2* 8.6  NEUTROABS 6.8  --   HGB 13.7 12.0  HCT 39.5 35.5*  MCV 91.9 93.2  PLT 419* 240   Basic Metabolic Panel: Recent Labs  Lab 09/19/21 0355 09/24/21 1611 09/25/21 0508  NA  --  132* 138  K  --  3.0* 2.3*  CL  --  92* 102  CO2  --  20* 21*  GLUCOSE  --  100* 79  BUN  --  58* 54*  CREATININE 0.96 4.37* 3.34*  CALCIUM  --  7.6* 7.1*   GFR: Estimated Creatinine  Clearance: 16.2 mL/min (A) (by C-G formula based on SCr of 3.34 mg/dL (H)). Liver Function Tests: Recent Labs  Lab 09/24/21 1746  AST 23  ALT 19  ALKPHOS 85  BILITOT 0.9  PROT 6.3*  ALBUMIN 3.2*   Recent Labs  Lab 09/24/21 1746  LIPASE 42   No results for input(s): AMMONIA in the last 168 hours. Coagulation Profile: No results for input(s): INR, PROTIME in the last 168 hours. Cardiac Enzymes: No results for input(s): CKTOTAL, CKMB, CKMBINDEX, TROPONINI in the last 168 hours. BNP (last 3 results) No results for input(s): PROBNP in the last 8760 hours. HbA1C: No results for input(s): HGBA1C in the last 72 hours. CBG: No results for input(s): GLUCAP in the last 168 hours. Lipid Profile: No results for input(s): CHOL, HDL, LDLCALC, TRIG, CHOLHDL, LDLDIRECT in the last 72 hours. Thyroid Function Tests: No results for input(s): TSH, T4TOTAL, FREET4, T3FREE, THYROIDAB in the last 72 hours. Anemia Panel: No results for input(s): VITAMINB12, FOLATE, FERRITIN, TIBC, IRON, RETICCTPCT in the last 72 hours. Sepsis Labs: Recent Labs  Lab 09/24/21 1746  LATICACIDVEN 1.4    Recent Results (from the past 240 hour(s))  Resp Panel by RT-PCR (Flu A&B, Covid) Nasopharyngeal Swab     Status: None   Collection Time: 09/15/21 11:08 AM   Specimen: Nasopharyngeal Swab; Nasopharyngeal(NP) swabs in vial transport medium  Result Value Ref Range Status   SARS Coronavirus 2 by RT PCR NEGATIVE NEGATIVE Final    Comment: (NOTE) SARS-CoV-2 target nucleic acids are NOT DETECTED.  The SARS-CoV-2 RNA is generally detectable in upper respiratory specimens during the acute phase of infection. The lowest concentration of SARS-CoV-2 viral copies this assay can detect is 138 copies/mL. A negative result does not preclude SARS-Cov-2 infection and should not be used as the sole basis for treatment or other patient management decisions. A negative result may occur with  improper specimen  collection/handling, submission of specimen other than nasopharyngeal swab, presence of viral mutation(s) within the areas targeted by this assay, and inadequate number of viral copies(<138 copies/mL). A negative result must be combined with clinical observations, patient history, and epidemiological information. The expected result is Negative.  Fact Sheet for Patients:  EntrepreneurPulse.com.au  Fact Sheet for Healthcare Providers:  IncredibleEmployment.be  This test is no t yet approved or cleared by the [place] FDA and  has been authorized for detection and/or diagnosis of SARS-CoV-2 by FDA under an Emergency Use Authorization (EUA). This EUA will remain  in effect (meaning this test can be used) for the duration of the COVID-19 declaration under Section 564(b)(1) of the Act, 21 U.S.C.section 360bbb-3(b)(1), unless the authorization is terminated  or revoked sooner.       Influenza A by PCR NEGATIVE NEGATIVE Final   Influenza B by PCR  NEGATIVE NEGATIVE Final    Comment: (NOTE) The Xpert Xpress SARS-CoV-2/FLU/RSV plus assay is intended as an aid in the diagnosis of influenza from Nasopharyngeal swab specimens and should not be used as a sole basis for treatment. Nasal washings and aspirates are unacceptable for Xpert Xpress SARS-CoV-2/FLU/RSV testing.  Fact Sheet for Patients: EntrepreneurPulse.com.au  Fact Sheet for Healthcare Providers: IncredibleEmployment.be  This test is not yet approved or cleared by the [place] FDA and has been authorized for detection and/or diagnosis of SARS-CoV-2 by FDA under an Emergency Use Authorization (EUA). This EUA will remain in effect (meaning this test can be used) for the duration of the COVID-19 declaration under Section 564(b)(1) of the Act, 21 U.S.C. section 360bbb-3(b)(1), unless the authorization is terminated or revoked.  Performed at [facility], [address] [place], [place] [address]   Culture, blood (routine x 2)     Status: None   Collection Time: 09/15/21  1:28 PM   Specimen: BLOOD RIGHT HAND  Result Value Ref Range Status   Specimen Description BLOOD RIGHT HAND  Final   Special Requests   Final    Blood Culture adequate volume BOTTLES DRAWN AEROBIC AND ANAEROBIC   Culture   Final    NO GROWTH 5 DAYS Performed at [facility], [address], [place], [place] [address]    Report Status 09/20/2021 FINAL  Final  Culture, blood (routine x 2)     Status: None   Collection Time: 09/15/21  1:28 PM   Specimen: BLOOD LEFT ARM  Result Value Ref Range Status   Specimen Description BLOOD LEFT ARM  Final   Special Requests   Final    Blood Culture adequate volume BOTTLES DRAWN AEROBIC AND ANAEROBIC   Culture   Final    NO GROWTH 5 DAYS Performed at [facility], [address], [place], [place] [address]    Report Status 09/20/2021 FINAL  Final  C Difficile Quick Screen w PCR reflex     Status: None   Collection Time: 09/16/21 12:57 PM   Specimen: STOOL  Result Value Ref Range Status   C Diff antigen NEGATIVE NEGATIVE Final   C Diff toxin NEGATIVE NEGATIVE Final   C Diff interpretation No C. difficile detected.  Final    Comment: Performed at [facility], [address] [place], [place] [address]  Resp Panel by RT-PCR (Flu A&B, Covid) Nasopharyngeal Swab     Status: None   Collection Time: 09/24/21  4:20 PM   Specimen: Nasopharyngeal Swab; Nasopharyngeal(NP) swabs in vial transport medium  Result Value Ref Range Status   SARS Coronavirus 2 by RT PCR NEGATIVE NEGATIVE Final    Comment: (NOTE) SARS-CoV-2 target nucleic acids are NOT DETECTED.  The SARS-CoV-2 RNA is generally detectable in upper respiratory specimens during the acute phase of infection. The lowest concentration of SARS-CoV-2 viral copies this assay can detect is 138 copies/mL. A negative result does not preclude SARS-Cov-2 infection and should not  be used as the sole basis for treatment or other patient management decisions. A negative result may occur with  improper specimen collection/handling, submission of specimen other than nasopharyngeal swab, presence of viral mutation(s) within the areas targeted by this assay, and inadequate number of viral copies(<138 copies/mL). A negative result must be combined with clinical observations, patient history, and epidemiological information. The expected result is Negative.  Fact Sheet for Patients:  EntrepreneurPulse.com.au  Fact Sheet for Healthcare Providers:  IncredibleEmployment.be  This test is no t yet  approved or cleared by the [place] and  has been authorized for detection and/or diagnosis of SARS-CoV-2 by FDA under an Emergency Use Authorization (EUA). This EUA will remain  in effect (meaning this test can be used) for the duration of the COVID-19 declaration under Section 564(b)(1) of the Act, 21 U.S.C.section 360bbb-3(b)(1), unless the authorization is terminated  or revoked sooner.       Influenza A by PCR NEGATIVE NEGATIVE Final   Influenza B by PCR NEGATIVE NEGATIVE Final    Comment: (NOTE) The Xpert Xpress SARS-CoV-2/FLU/RSV plus assay is intended as an aid in the diagnosis of influenza from Nasopharyngeal swab specimens and should not be used as a sole basis for treatment. Nasal washings and aspirates are unacceptable for Xpert Xpress SARS-CoV-2/FLU/RSV testing.  Fact Sheet for Patients: EntrepreneurPulse.com.au  Fact Sheet for Healthcare Providers: IncredibleEmployment.be  This test is not yet approved or cleared by the [place] FDA and has been authorized for detection and/or diagnosis of SARS-CoV-2 by FDA under an Emergency Use Authorization (EUA). This EUA will remain in effect (meaning this test can be used) for the duration of the COVID-19 declaration under Section  564(b)(1) of the Act, 21 U.S.C. section 360bbb-3(b)(1), unless the authorization is terminated or revoked.  Performed at [facility], [address] [place], [place] [address]          Radiology Studies: CT ABDOMEN PELVIS WO CONTRAST  Result Date: 09/24/2021 CLINICAL DATA:  Abdominal abscess. EXAM: CT ABDOMEN AND PELVIS WITHOUT CONTRAST TECHNIQUE: Multidetector CT imaging of the abdomen and pelvis was performed following the standard protocol without IV contrast. COMPARISON:  September 15, 2021 FINDINGS: Lower chest: Atelectasis versus scarring in the left lung base. Calcific atherosclerotic disease of the aorta and coronary arteries. Hepatobiliary: No focal liver abnormality is seen. Status post cholecystectomy. No biliary dilatation. Pancreas: Unremarkable. No pancreatic ductal dilatation or surrounding inflammatory changes. Spleen: Normal in size without focal abnormality. Adrenals/Urinary Tract: Adrenal glands are unremarkable. Kidneys are normal, without renal calculi, focal lesion, or hydronephrosis. Bladder is unremarkable. Stomach/Bowel: Stomach is within normal limits. Appendix appears normal. No evidence of bowel wall thickening, distention, or inflammatory changes. Vascular/Lymphatic: Aortic atherosclerosis. No enlarged abdominal or pelvic lymph nodes. Reproductive: Stable partially calcified left pelvic mass. Normal appearance of the right ovary and uterus. Other: No abdominal wall hernia or abnormality. No abdominopelvic ascites. Musculoskeletal: Marked arthritic changes of the lumbosacral spine. Scoliosis. Prior lumbar spine decompensation. Decreased in size gas and fluid collection adjacent to left L4-L5 disc space and extending into the left psoas muscle. Prominent left-sided L4-L5 disc bulge. IMPRESSION: 1. Decreased in size known gas and fluid collection adjacent to left L4-L5 disc space, extending into the left psoas muscle. 2. Stable partially calcified left pelvic mass. 3.  Calcific atherosclerotic disease of the aorta and coronary arteries. Aortic Atherosclerosis (ICD10-I70.0). Electronically Signed   By: Fidela Salisbury M.D.   On: 09/24/2021 18:13        Scheduled Meds:  atorvastatin  20 mg Oral Daily   [START ON 09/26/2021] calcitRIOL  0.25 mcg Oral Once per day on Mon Wed Fri   enoxaparin (LOVENOX) injection  30 mg Subcutaneous Q82N   folic acid  1 mg Intravenous Daily   metoprolol succinate  25 mg Oral Daily   pantoprazole (PROTONIX) IV  40 mg Intravenous Q12H   predniSONE  9 mg Oral Q breakfast   thiamine injection  100 mg Intravenous Daily   traZODone  50 mg Oral QHS  Continuous Infusions:  sodium chloride 100 mL/hr at 09/25/21 0811   doxycycline (VIBRAMYCIN) IV 100 mg (09/25/21 0610)   potassium chloride       LOS: 1 day    Time spent: 35 minutes    Vale Mousseau Darleen Crocker, DO [facility]  If 7PM-7AM, please contact night-coverage [email or web address] 09/25/2021, 9:40 AM

## 2021-09-25 NOTE — Progress Notes (Signed)
Date and time results received: 09/25/21 0718   Test: Potassium Critical Value: 2.3  Name of Provider Notified: Dr. Manuella Ghazi

## 2021-09-25 NOTE — Progress Notes (Signed)
PHARMACIST - PHYSICIAN COMMUNICATION  CONCERNING:  Enoxaparin (Lovenox) for DVT Prophylaxis    RECOMMENDATION: Patient was prescribed enoxaprin 40mg  q24 hours for VTE prophylaxis.   Filed Weights   09/24/21 2051  Weight: 104.5 kg (230 lb 6.1 oz)    Body mass index is 43.53 kg/m.  Estimated Creatinine Clearance: 16.2 mL/min (A) (by C-G formula based on SCr of 3.34 mg/dL (H)).  Patient is candidate for enoxaparin 30mg  every 24 hours based on CrCl <24ml/min or Weight <45kg  DESCRIPTION: Pharmacy has adjusted enoxaparin dose per [facility] policy.  Patient is now receiving enoxaparin 30 mg every 24 hours    Ena Dawley, PharmD Clinical Pharmacist  09/25/2021 8:52 AM

## 2021-09-25 NOTE — Progress Notes (Signed)
Patient was anxious. Paged MD to make aware. MD stated ok to give prn xanax refer to mar for correct dose and strength AS PRN on MAR. Pt  requested PRN for pain stated 10 of 10 in head. Will continue to monitor. Pt lying in bed bed tearful. Able to make needs known. Call bell placed in reach.

## 2021-09-25 NOTE — Progress Notes (Signed)
Patient was anxious. Paged MD to make aware. MD stated ok to give prn xanax refer to mar for correct dose and strength.

## 2021-09-26 LAB — BASIC METABOLIC PANEL
Anion gap: 11 (ref 5–15)
BUN: 44 mg/dL — ABNORMAL HIGH (ref 8–23)
CO2: 20 mmol/L — ABNORMAL LOW (ref 22–32)
Calcium: 7.1 mg/dL — ABNORMAL LOW (ref 8.9–10.3)
Chloride: 105 mmol/L (ref 98–111)
Creatinine, Ser: 1.83 mg/dL — ABNORMAL HIGH (ref 0.44–1.00)
GFR, Estimated: 28 mL/min — ABNORMAL LOW (ref >60)
Glucose, Bld: 78 mg/dL (ref 70–99)
Potassium: 2.2 mmol/L — CL (ref 3.5–5.1)
Sodium: 136 mmol/L (ref 135–145)

## 2021-09-26 LAB — CALCIUM, IONIZED: Calcium, Ionized, Serum: 4.2 mg/dL — ABNORMAL LOW (ref 4.5–5.6)

## 2021-09-26 LAB — PARATHYROID HORMONE, INTACT (NO CA): PTH: 110 pg/mL — ABNORMAL HIGH (ref 15–65)

## 2021-09-26 LAB — MAGNESIUM: Magnesium: 1.2 mg/dL — ABNORMAL LOW (ref 1.7–2.4)

## 2021-09-26 MED ORDER — POTASSIUM CHLORIDE 10 MEQ/100ML IV SOLN
INTRAVENOUS | Status: AC
  Administered 2021-09-26: 10 meq via INTRAVENOUS
  Filled 2021-09-26: qty 100

## 2021-09-26 MED ORDER — CALCIUM GLUCONATE-NACL 1-0.675 GM/50ML-% IV SOLN
1.0000 g | Freq: Once | INTRAVENOUS | Status: AC
  Administered 2021-09-26: 1000 mg via INTRAVENOUS
  Filled 2021-09-26: qty 50

## 2021-09-26 MED ORDER — POTASSIUM CHLORIDE 10 MEQ/100ML IV SOLN
10.0000 meq | INTRAVENOUS | Status: AC
  Administered 2021-09-26 (×5): 10 meq via INTRAVENOUS
  Filled 2021-09-26 (×4): qty 100

## 2021-09-26 MED ORDER — MAGNESIUM SULFATE 4 GM/100ML IV SOLN
4.0000 g | Freq: Once | INTRAVENOUS | Status: AC
  Administered 2021-09-26: 4 g via INTRAVENOUS
  Filled 2021-09-26: qty 100

## 2021-09-26 MED ORDER — POTASSIUM CHLORIDE 10 MEQ/100ML IV SOLN
INTRAVENOUS | Status: AC
  Filled 2021-09-26: qty 100

## 2021-09-26 MED ORDER — POTASSIUM CHLORIDE CRYS ER 20 MEQ PO TBCR
40.0000 meq | EXTENDED_RELEASE_TABLET | Freq: Two times a day (BID) | ORAL | Status: AC
  Administered 2021-09-26: 40 meq via ORAL
  Filled 2021-09-26 (×2): qty 2

## 2021-09-26 NOTE — TOC Initial Note (Signed)
Transition of Care [facility]) - Initial/Assessment Note    Patient Details  Name: Nicole [name] MRN: [id number] Date of Birth: 02/25/1946  Transition of Care [facility]) CM/SW Contact:    Salome Arnt, [place] Phone Number: 09/26/2021, 11:15 AM  Clinical Narrative:  Pt admitted due to hypokalemia and acute kidney injury. Pt known to TOC from recent admission. She is a resident at [facility]. LCSW spoke with pt who confirms plan to return when medically stable. LCSW spoke with Melissa at facility. She reports pt primarily uses wheelchair. She is active with in house PT and will need HHPT order to resume. Melissa plans to assess pt tomorrow morning in anticipate of d/c. TOC will continue to follow.                   Expected Discharge Plan: Assisted Living Barriers to Discharge: Continued Medical Work up   Patient Goals and CMS Choice Patient states their goals for this hospitalization and ongoing recovery are:: Return to ALF   Choice offered to / list presented to : Patient  Expected Discharge Plan and Services Expected Discharge Plan: Assisted Living In-house Referral: Clinical Social Work   Post Acute Care Choice: Resumption of Svcs/PTA Provider Living arrangements for the past 2 months: Assisted Living Facility                 DME Arranged: N/A DME Agency: NA       HH Arranged: PT       Representative spoke with at [place]: In house PT at [place] Arrangements/Services Living arrangements for the past 2 months: [place] Lives with:: Facility Resident Patient language and need for interpreter reviewed:: Yes Do you feel safe going back to the place where you live?: Yes      Need for Family Participation in Patient Care: No (Comment) Care giver support system in place?: Yes (comment) Current home services: DME (wheelchair, 3N1) Criminal Activity/Legal Involvement Pertinent to Current Situation/Hospitalization: No - Comment as  needed  Activities of Daily Living      Permission Sought/Granted         Permission granted to share info w AGENCY: [facility]  Permission granted to share info w Relationship: ALF     Emotional Assessment   Attitude/Demeanor/Rapport: Engaged Affect (typically observed): Appropriate Orientation: : Oriented to Self, Oriented to Place, Oriented to  Time, Oriented to Situation Alcohol / Substance Use: Not Applicable Psych Involvement: No (comment)  Admission diagnosis:  Failure to thrive in adult [R62.7] AKI (acute kidney injury) ([place]) [N17.9] Patient Active Problem List   Diagnosis Date Noted   Failure to thrive in adult 09/24/2021   Myositis 09/16/2021   Psoas abscess, left (2.0 cm on MRI)  09/16/2021   AKI (acute kidney injury) ([place]) 21/30/8657   Acute metabolic encephalopathy 84/69/6295   Hypokalemia 07/20/2021   Elevated MCV 07/20/2021   Mixed hyperlipidemia 07/20/2021   Diverticulitis 05/02/2018   Diverticulitis of colon 05/01/2018   Morbid obesity with BMI of 50.0-59.9, adult ([place]) 05/01/2018   Chronic diastolic HF (heart failure) ([place]) 05/01/2018   On prednisone therapy 05/31/2017   Positive anti-CCP test 05/31/2017   Rheumatoid factor positive 05/31/2017   Memory loss 04/04/2017   Polymyalgia rheumatica ([place]) 04/04/2017   High risk medication use 12/07/2016   DDD (degenerative disc disease), lumbar 09/27/2016   HTN (hypertension) 06/05/2016   Chronic pain syndrome 06/05/2016   Acute diverticulitis 06/02/2016   Diverticulitis large intestine 06/02/2016  Spinal stenosis of lumbosacral region 02/29/2016   Joint pain 02/29/2016   Endometrial polyp 02/06/2014   Postmenopausal bleeding 01/30/2014   GERD (gastroesophageal reflux disease) 09/29/2013   Spinal stenosis, thoracic 09/29/2013   Shingles 09/29/2013   Thoracic or lumbosacral neuritis or radiculitis, unspecified 05/04/2011   Abnormality of gait 05/04/2011   Muscle weakness (generalized) 05/04/2011    Bilateral primary osteoarthritis of knee 04/07/2009   KNEE PAIN 04/07/2009   PCP:  Jake Samples, PA-C Pharmacy:   [facility], [place] [address] Phone: [phone] Fax: [phone]     Social Determinants of Health (SDOH) Interventions    Readmission Risk Interventions Readmission Risk Prevention Plan 09/26/2021  Transportation Screening Complete  HRI or Home Care Consult Complete  Social Work Consult for [place] Planning/Counseling Complete  Palliative Care Screening Not Applicable  Medication Review Press photographer) Complete  Some recent data might be hidden

## 2021-09-26 NOTE — Progress Notes (Signed)
PROGRESS NOTE    Nicole [name]  WRU:[id number] DOB: Feb 18, 1946 DOA: 09/24/2021 PCP: Jake Samples, PA-C   Brief Narrative:   Nicole [name] is a 75 y.o. female with a history of polymyalgia rheumatica with positive Rh factor and on chronic steroids, hypertension, obesity, chronic diastolic heart failure.  Patient recently admitted from 11/24 until 11/28 with nausea, abdominal pain, back pain.  She was found to have a 2 cm psoas abscess.  She was started on antibiotics and had improvement of her inflammatory numbers.  She was discharged on oral doxycycline to finish out a 7-day course which will complete next Monday.  Since that discharge, she has been at the [facility] and has had difficulty eating and drinking.  She continued to have some persistent nausea and vomiting with poor oral intake and developing dehydration and was admitted with the symptoms as well as AKI.  Assessment & Plan:   Principal Problem:   Failure to thrive in adult Active Problems:   HTN (hypertension)   On prednisone therapy   Morbid obesity with BMI of 50.0-59.9, adult (HCC)   Chronic diastolic HF (heart failure) (HCC)   Hypokalemia   AKI (acute kidney injury) ([place])   Psoas abscess, left (2.0 cm on MRI)    Intractable nausea and vomiting; improved -Continue on Zofran as needed -Continue IV fluid   AKI-improving -Prerenal from poor oral intake along with nausea and vomiting -Continue IV fluids -Monitor creatinine   Severe hypokalemia-persistent -Replete IV and p.o. -Recheck BMP  Hypomagnesemia -Replete and reevaluate   Hypocalcemia -Replete and reevaluate -Check ionized calcium, PTH, and vitamin D levels   Left psoas abscess -Continue IV doxycycline day 6/7   Chronic prednisone -Continue   History of hypertension -BP soft this a.m., hold metoprolol  Cervical DJD -With intermittent numbness to hands     DVT prophylaxis: Lovenox Code Status: Full Family Communication: Discussed with  daughter on phone 12/5 Disposition Plan:  Status is: Inpatient   Remains inpatient appropriate because: Continues on IV fluids and meds.     Consultants:  None   Procedures:  See below  Antimicrobials:  Anti-infectives (From admission, onward)    Start     Dose/Rate Route Frequency Ordered Stop   09/24/21 1900  doxycycline (VIBRAMYCIN) 100 mg in sodium chloride 0.9 % 250 mL IVPB        100 mg 125 mL/hr over 120 Minutes Intravenous Every 12 hours 09/24/21 1846         Subjective: Patient seen and evaluated today with no new acute complaints or concerns.  She is noted to have some numbness to her hands yesterday which have now improved.  Objective: Vitals:   09/25/21 1512 09/25/21 2105 09/26/21 0516 09/26/21 0517  BP: 104/68 114/68  118/67  Pulse: 94 91 98 99  Resp: 18 18  18   Temp: 98.5 F (36.9 C) 98.8 F (37.1 C)  98.9 F (37.2 C)  TempSrc: Oral Oral  Oral  SpO2: 96% 90% 94% 94%  Weight:      Height:        Intake/Output Summary (Last 24 hours) at 09/26/2021 1135 Last data filed at 09/26/2021 1100 Gross per 24 hour  Intake 3111.88 ml  Output 2100 ml  Net 1011.88 ml   Filed Weights   09/24/21 2051  Weight: 104.5 kg    Examination:  General exam: Appears calm and comfortable, obese Respiratory system: Clear to auscultation. Respiratory effort normal. Cardiovascular system: S1 & S2 heard, RRR.  Gastrointestinal  system: Abdomen is soft Central nervous system: Alert and awake Extremities: No edema Skin: No significant lesions noted Psychiatry: Flat affect.    Data Reviewed: I have personally reviewed following labs and imaging studies  CBC: Recent Labs  Lab 09/24/21 1611 09/25/21 0508  WBC 11.2* 8.6  NEUTROABS 6.8  --   HGB 13.7 12.0  HCT 39.5 35.5*  MCV 91.9 93.2  PLT 419* 841   Basic Metabolic Panel: Recent Labs  Lab 09/24/21 1611 09/25/21 0508 09/25/21 1658 09/26/21 0356  NA 132* 138 135 136  K 3.0* 2.3* 3.3* 2.2*  CL 92* 102 102  105  CO2 20* 21* 21* 20*  GLUCOSE 100* 79 97 78  BUN 58* 54* 50* 44*  CREATININE 4.37* 3.34* 2.54* 1.83*  CALCIUM 7.6* 7.1* 7.2* 7.1*  MG  --   --   --  1.2*   GFR: Estimated Creatinine Clearance: 29.6 mL/min (A) (by C-G formula based on SCr of 1.83 mg/dL (H)). Liver Function Tests: Recent Labs  Lab 09/24/21 1746  AST 23  ALT 19  ALKPHOS 85  BILITOT 0.9  PROT 6.3*  ALBUMIN 3.2*   Recent Labs  Lab 09/24/21 1746  LIPASE 42   No results for input(s): AMMONIA in the last 168 hours. Coagulation Profile: No results for input(s): INR, PROTIME in the last 168 hours. Cardiac Enzymes: No results for input(s): CKTOTAL, CKMB, CKMBINDEX, TROPONINI in the last 168 hours. BNP (last 3 results) No results for input(s): PROBNP in the last 8760 hours. HbA1C: No results for input(s): HGBA1C in the last 72 hours. CBG: No results for input(s): GLUCAP in the last 168 hours. Lipid Profile: No results for input(s): CHOL, HDL, LDLCALC, TRIG, CHOLHDL, LDLDIRECT in the last 72 hours. Thyroid Function Tests: No results for input(s): TSH, T4TOTAL, FREET4, T3FREE, THYROIDAB in the last 72 hours. Anemia Panel: No results for input(s): VITAMINB12, FOLATE, FERRITIN, TIBC, IRON, RETICCTPCT in the last 72 hours. Sepsis Labs: Recent Labs  Lab 09/24/21 1746  LATICACIDVEN 1.4    Recent Results (from the past 240 hour(s))  C Difficile Quick Screen w PCR reflex     Status: None   Collection Time: 09/16/21 12:57 PM   Specimen: STOOL  Result Value Ref Range Status   C Diff antigen NEGATIVE NEGATIVE Final   C Diff toxin NEGATIVE NEGATIVE Final   C Diff interpretation No C. difficile detected.  Final    Comment: Performed at [facility], [address] [place], [place] [address]  Resp Panel by RT-PCR (Flu A&B, Covid) Nasopharyngeal Swab     Status: None   Collection Time: 09/24/21  4:20 PM   Specimen: Nasopharyngeal Swab; Nasopharyngeal(NP) swabs in vial transport medium  Result Value Ref Range  Status   SARS Coronavirus 2 by RT PCR NEGATIVE NEGATIVE Final    Comment: (NOTE) SARS-CoV-2 target nucleic acids are NOT DETECTED.  The SARS-CoV-2 RNA is generally detectable in upper respiratory specimens during the acute phase of infection. The lowest concentration of SARS-CoV-2 viral copies this assay can detect is 138 copies/mL. A negative result does not preclude SARS-Cov-2 infection and should not be used as the sole basis for treatment or other patient management decisions. A negative result may occur with  improper specimen collection/handling, submission of specimen other than nasopharyngeal swab, presence of viral mutation(s) within the areas targeted by this assay, and inadequate number of viral copies(<138 copies/mL). A negative result must be combined with clinical observations, patient history, and epidemiological information. The expected result is  Negative.  Fact Sheet for Patients:  EntrepreneurPulse.com.au  Fact Sheet for Healthcare Providers:  IncredibleEmployment.be  This test is no t yet approved or cleared by the [place] FDA and  has been authorized for detection and/or diagnosis of SARS-CoV-2 by FDA under an Emergency Use Authorization (EUA). This EUA will remain  in effect (meaning this test can be used) for the duration of the COVID-19 declaration under Section 564(b)(1) of the Act, 21 U.S.C.section 360bbb-3(b)(1), unless the authorization is terminated  or revoked sooner.       Influenza A by PCR NEGATIVE NEGATIVE Final   Influenza B by PCR NEGATIVE NEGATIVE Final    Comment: (NOTE) The Xpert Xpress SARS-CoV-2/FLU/RSV plus assay is intended as an aid in the diagnosis of influenza from Nasopharyngeal swab specimens and should not be used as a sole basis for treatment. Nasal washings and aspirates are unacceptable for Xpert Xpress SARS-CoV-2/FLU/RSV testing.  Fact Sheet for  Patients: EntrepreneurPulse.com.au  Fact Sheet for Healthcare Providers: IncredibleEmployment.be  This test is not yet approved or cleared by the [place] FDA and has been authorized for detection and/or diagnosis of SARS-CoV-2 by FDA under an Emergency Use Authorization (EUA). This EUA will remain in effect (meaning this test can be used) for the duration of the COVID-19 declaration under Section 564(b)(1) of the Act, 21 U.S.C. section 360bbb-3(b)(1), unless the authorization is terminated or revoked.  Performed at [facility], [address] [place], [place] [address]          Radiology Studies: CT ABDOMEN PELVIS WO CONTRAST  Result Date: 09/24/2021 CLINICAL DATA:  Abdominal abscess. EXAM: CT ABDOMEN AND PELVIS WITHOUT CONTRAST TECHNIQUE: Multidetector CT imaging of the abdomen and pelvis was performed following the standard protocol without IV contrast. COMPARISON:  September 15, 2021 FINDINGS: Lower chest: Atelectasis versus scarring in the left lung base. Calcific atherosclerotic disease of the aorta and coronary arteries. Hepatobiliary: No focal liver abnormality is seen. Status post cholecystectomy. No biliary dilatation. Pancreas: Unremarkable. No pancreatic ductal dilatation or surrounding inflammatory changes. Spleen: Normal in size without focal abnormality. Adrenals/Urinary Tract: Adrenal glands are unremarkable. Kidneys are normal, without renal calculi, focal lesion, or hydronephrosis. Bladder is unremarkable. Stomach/Bowel: Stomach is within normal limits. Appendix appears normal. No evidence of bowel wall thickening, distention, or inflammatory changes. Vascular/Lymphatic: Aortic atherosclerosis. No enlarged abdominal or pelvic lymph nodes. Reproductive: Stable partially calcified left pelvic mass. Normal appearance of the right ovary and uterus. Other: No abdominal wall hernia or abnormality. No abdominopelvic ascites. Musculoskeletal:  Marked arthritic changes of the lumbosacral spine. Scoliosis. Prior lumbar spine decompensation. Decreased in size gas and fluid collection adjacent to left L4-L5 disc space and extending into the left psoas muscle. Prominent left-sided L4-L5 disc bulge. IMPRESSION: 1. Decreased in size known gas and fluid collection adjacent to left L4-L5 disc space, extending into the left psoas muscle. 2. Stable partially calcified left pelvic mass. 3. Calcific atherosclerotic disease of the aorta and coronary arteries. Aortic Atherosclerosis (ICD10-I70.0). Electronically Signed   By: Fidela Salisbury M.D.   On: 09/24/2021 18:13        Scheduled Meds:  atorvastatin  20 mg Oral Daily   calcitRIOL  0.25 mcg Oral Once per day on Mon Wed Fri   enoxaparin (LOVENOX) injection  30 mg Subcutaneous N36R   folic acid  1 mg Intravenous Daily   lactose free nutrition  237 mL Oral TID BM   leflunomide  10 mg Oral Daily   melatonin  3 mg Oral QHS  metoprolol succinate  25 mg Oral Daily   pantoprazole (PROTONIX) IV  40 mg Intravenous Q12H   potassium chloride  40 mEq Oral BID   predniSONE  9 mg Oral Q breakfast   tapentadol  100 mg Oral Q12H   thiamine injection  100 mg Intravenous Daily   traZODone  25 mg Oral QHS   Continuous Infusions:  sodium chloride 100 mL/hr at 09/25/21 1954   calcium gluconate     doxycycline (VIBRAMYCIN) IV 100 mg (09/26/21 0532)   potassium chloride 10 mEq (09/26/21 1129)     LOS: 2 days    Time spent: 35 minutes    Gracelin Weisberg Darleen Crocker, DO [facility]  If 7PM-7AM, please contact night-coverage [email or web address] 09/26/2021, 11:35 AM

## 2021-09-26 NOTE — Progress Notes (Signed)
Sent on call message of new critical lab value that was called at 0630 of 2.2 potassium. No new orders at this time. Pt lying in bed with eyes closed. No new complaints of pain or discomfort. Will continue to monitor

## 2021-09-26 NOTE — Evaluation (Signed)
Clinical/Bedside Swallow Evaluation Patient Details  Name: Nicole [name] MRN: [id number] Date of Birth: April 08, 1946  Today's Date: 09/26/2021 Time: SLP Start Time (ACUTE ONLY): 78 SLP Stop Time (ACUTE ONLY): 1552 SLP Time Calculation (min) (ACUTE ONLY): 25 min  Past Medical History:  Past Medical History:  Diagnosis Date   AC (acromioclavicular) joint bone spurs    lt shoulder   Anemia    Arthritis    Carpal tunnel syndrome, bilateral    CHF (congestive heart failure) (HCC)    Collagen vascular disease (HCC)    Gastroesophageal reflux    Headache    recent visit to ER @ [facility] for severe headache   Hypertension    Lumbar stenosis    Hx of ESIs by Dr. Nelva Bush   Polyarthralgia    Polymyalgia [facility])    Shingles    Spinal stenosis    Past Surgical History:  Past Surgical History:  Procedure Laterality Date   BACK SURGERY  07/05/2018, 07/2018   x2    BIOPSY  09/22/2020   Procedure: BIOPSY;  Surgeon: Nicole Houston, MD;  [facility];  Service: Endoscopy;;   CHOLECYSTECTOMY     COLONOSCOPY  06/11/2012   Procedure: COLONOSCOPY;  Surgeon: Nicole So, MD;  Location: [facility];  Service: Gastroenterology;  Laterality: N/A;   COLONOSCOPY WITH PROPOFOL N/A 09/22/2020   Procedure: COLONOSCOPY WITH PROPOFOL;  Surgeon: Nicole Houston, MD;  Location: [facility];  Service: Endoscopy;  Laterality: N/A;  730   HYSTEROSCOPY WITH D & C N/A 02/25/2014   Procedure: DILATATION AND CURETTAGE /HYSTEROSCOPY;  Surgeon: Nicole Buff, MD;  Location: [facility];  Service: Gynecology;  Laterality: N/A;   POLYPECTOMY N/A 02/25/2014   Procedure: POLYPECTOMY;  Surgeon: Nicole Buff, MD;  Location: [facility];  Service: Gynecology;  Laterality: N/A;   RESECTION DISTAL CLAVICAL Right 03/26/2015   Procedure: OPEN DISTAL CLAVICAL RESECTION ;  Surgeon: Nicole Cedars, MD;  Location: [facility];  Service: Orthopedics;  Laterality: Right;   HPI:  Nicole [name] is a 75 y.o. female with a history of  polymyalgia rheumatica with positive Rh factor and on chronic steroids, hypertension, obesity, chronic diastolic heart failure.  Patient recently admitted from 11/24 until 11/28 with nausea, abdominal pain, back pain.  She was found to have a 2 cm psoas abscess.  She was started on antibiotics and had improvement of her inflammatory numbers.  She was discharged on oral doxycycline to finish out a 7-day course which will complete next Monday.  Since that discharge, she has been at the [facility] and has had difficulty eating and drinking.  She continued to have some persistent nausea and vomiting with poor oral intake and developing dehydration and was admitted with the symptoms as well as AKI. BSE requested.    Assessment / Plan / Recommendation  Clinical Impression  Clinical swallow evaluation completed at bedside. Oral motor examination is WNL. Pt has upper dentures. She shows no overt signs or symptoms of aspiration with limited po trials (ice chips, thin water via straw, puree, and graham crackers). She does report globus sensation near sternal notch and says it feels like foods "won't go down". She indicates she feels this started ~2 weeks ago after some vomiting episodes. Pt does not appear to have oropharyngeal swallowing problems, however difficult to determine at bedside due to limited intake. Her symptoms may be more related to esophageal dysphagia given occurrence following emesis. Recommend regular textures and thin liquids from  SLP perspective, however Pt is currently on puree. SLP will follow for diet tolerance, Consider GI consult if symptoms persist. Above to RN. OK for po medications whole with water. SLP Visit Diagnosis: Dysphagia, unspecified (R13.10)    Aspiration Risk  No limitations    Diet Recommendation Regular;Thin liquid   Liquid Administration via: Cup;Straw Medication Administration: Whole meds with liquid Supervision: Patient able to self feed Postural Changes: Seated  upright at 90 degrees;Remain upright for at least 30 minutes after po intake (as able)    Other  Recommendations Recommended Consults: Consider GI evaluation;Consider esophageal assessment Oral Care Recommendations: Oral care BID Other Recommendations: Clarify dietary restrictions    Recommendations for follow up therapy are one component of a multi-disciplinary discharge planning process, led by the attending physician.  Recommendations may be updated based on patient status, additional functional criteria and insurance authorization.  Follow up Recommendations No SLP follow up      Assistance Recommended at Discharge    Functional Status Assessment Patient has had a recent decline in their functional status and demonstrates the ability to make significant improvements in function in a reasonable and predictable amount of time.  Frequency and Duration min 2x/week  1 week       Prognosis Prognosis for Safe Diet Advancement: Good      Swallow Study   General Date of Onset: 09/24/21 HPI: Nicole [name] is a 75 y.o. female with a history of polymyalgia rheumatica with positive Rh factor and on chronic steroids, hypertension, obesity, chronic diastolic heart failure.  Patient recently admitted from 11/24 until 11/28 with nausea, abdominal pain, back pain.  She was found to have a 2 cm psoas abscess.  She was started on antibiotics and had improvement of her inflammatory numbers.  She was discharged on oral doxycycline to finish out a 7-day course which will complete next Monday.  Since that discharge, she has been at the [facility] and has had difficulty eating and drinking.  She continued to have some persistent nausea and vomiting with poor oral intake and developing dehydration and was admitted with the symptoms as well as AKI. BSE requested. Type of Study: Bedside Swallow Evaluation Previous Swallow Assessment: N/A Diet Prior to this Study: Dysphagia 1 (puree);Thin liquids Temperature  Spikes Noted: No Respiratory Status: Room air History of Recent Intubation: No Behavior/Cognition: Alert;Cooperative;Pleasant mood Oral Cavity Assessment: Within Functional Limits Oral Care Completed by SLP: Yes Oral Cavity - Dentition: Dentures, top Vision: Functional for self-feeding Self-Feeding Abilities: Able to feed self Patient Positioning: Partially reclined (due to back pain per Pt) Baseline Vocal Quality: Normal Volitional Cough: Strong Volitional Swallow: Able to elicit    Oral/Motor/Sensory Function Overall Oral Motor/Sensory Function: Within functional limits   Ice Chips Ice chips: Within functional limits Presentation: Spoon   Thin Liquid Thin Liquid: Within functional limits Presentation: Straw;Self Fed Other Comments: Pt reports "bubble" feeling post swallow    Nectar Thick Nectar Thick Liquid: Not tested   Honey Thick Honey Thick Liquid: Not tested   Puree Puree: Within functional limits Presentation: Spoon   Solid     Solid: Within functional limits Presentation: Self Fed (limited intake per Pt preference)     Thank you,  Genene Churn, [place]  Marek Nghiem 09/26/2021,3:54 PM

## 2021-09-27 LAB — BASIC METABOLIC PANEL
Anion gap: 9 (ref 5–15)
BUN: 24 mg/dL — ABNORMAL HIGH (ref 8–23)
CO2: 21 mmol/L — ABNORMAL LOW (ref 22–32)
Calcium: 7.9 mg/dL — ABNORMAL LOW (ref 8.9–10.3)
Chloride: 109 mmol/L (ref 98–111)
Creatinine, Ser: 1.12 mg/dL — ABNORMAL HIGH (ref 0.44–1.00)
GFR, Estimated: 51 mL/min — ABNORMAL LOW (ref >60)
Glucose, Bld: 82 mg/dL (ref 70–99)
Potassium: 2.8 mmol/L — ABNORMAL LOW (ref 3.5–5.1)
Sodium: 139 mmol/L (ref 135–145)

## 2021-09-27 LAB — MAGNESIUM: Magnesium: 2.2 mg/dL (ref 1.7–2.4)

## 2021-09-27 LAB — CBC
HCT: 32.9 % — ABNORMAL LOW (ref 36.0–46.0)
Hemoglobin: 10.8 g/dL — ABNORMAL LOW (ref 12.0–15.0)
MCH: 32.2 pg (ref 26.0–34.0)
MCHC: 32.8 g/dL (ref 30.0–36.0)
MCV: 98.2 fL (ref 80.0–100.0)
Platelets: 259 10*3/uL (ref 150–400)
RBC: 3.35 MIL/uL — ABNORMAL LOW (ref 3.87–5.11)
RDW: 14.5 % (ref 11.5–15.5)
WBC: 6.5 10*3/uL (ref 4.0–10.5)
nRBC: 0 % (ref 0.0–0.2)

## 2021-09-27 MED ORDER — POTASSIUM CHLORIDE CRYS ER 20 MEQ PO TBCR
40.0000 meq | EXTENDED_RELEASE_TABLET | Freq: Once | ORAL | Status: AC
  Administered 2021-09-27: 40 meq via ORAL
  Filled 2021-09-27: qty 2

## 2021-09-27 MED ORDER — ENOXAPARIN SODIUM 40 MG/0.4ML IJ SOSY
40.0000 mg | PREFILLED_SYRINGE | INTRAMUSCULAR | Status: DC
  Administered 2021-09-27 – 2021-09-28 (×2): 40 mg via SUBCUTANEOUS
  Filled 2021-09-27 (×2): qty 0.4

## 2021-09-27 MED ORDER — POTASSIUM CHLORIDE 10 MEQ/100ML IV SOLN
10.0000 meq | INTRAVENOUS | Status: AC
  Administered 2021-09-27 (×6): 10 meq via INTRAVENOUS
  Filled 2021-09-27 (×6): qty 100

## 2021-09-27 NOTE — Plan of Care (Signed)

## 2021-09-27 NOTE — Progress Notes (Signed)
Speech Language Pathology Treatment: Dysphagia  Patient Details Name: Nicole [name] MRN: [id number] DOB: [birth year] Today's Date: 09/27/2021 Time: 7628-3151 SLP Time Calculation (min) (ACUTE ONLY): 11 min  Assessment / Plan / Recommendation Clinical Impression  Pt seen in room for follow up from clinical swallow evaluation yesterday. Pt reports that she received regular textures, but mostly has been consuming grits, soups, water, and coffee. She denies difficulty swallowing and no reports of globus, but has a poor appetite. Pt observed with straw sips thin water and consumed without signs or symptoms of aspiration. Recommend continuing diet as ordered, regular and thin with standard aspiration and reflux precautions. Pt has been consuming meals somewhat reclined due to back pain, however she was encouraged to sit as upright as possible to allow the benefit of gravity to assist with esophageal swallow. SLP will sign off. Reconsult if the need arises.    HPI HPI: Nicole [name] is a 75 y.o. female with a history of polymyalgia rheumatica with positive Rh factor and on chronic steroids, hypertension, obesity, chronic diastolic heart failure.  Patient recently admitted from 11/24 until 11/28 with nausea, abdominal pain, back pain.  She was found to have a 2 cm psoas abscess.  She was started on antibiotics and had improvement of her inflammatory numbers.  She was discharged on oral doxycycline to finish out a 7-day course which will complete next Monday.  Since that discharge, she has been at the [facility] and has had difficulty eating and drinking.  She continued to have some persistent nausea and vomiting with poor oral intake and developing dehydration and was admitted with the symptoms as well as AKI. BSE requested.      SLP Plan  Discharge SLP treatment due to (comment) (Pt on baseline diet)      Recommendations for follow up therapy are one component of a multi-disciplinary discharge planning  process, led by the attending physician.  Recommendations may be updated based on patient status, additional functional criteria and insurance authorization.    Recommendations  Diet recommendations: Regular;Thin liquid Liquids provided via: Cup;Straw Medication Administration: Whole meds with liquid Supervision: Patient able to self feed Compensations: Slow rate Postural Changes and/or Swallow Maneuvers: Seated upright 90 degrees;Upright 30-60 min after meal                Oral Care Recommendations: Oral care BID Follow Up Recommendations: No SLP follow up SLP Visit Diagnosis: Dysphagia, unspecified (R13.10) Plan: Discharge SLP treatment due to (comment) (Pt on baseline diet)       Thank you,  Nicole [name], [place]                 [place]  09/27/2021, 2:22 PM

## 2021-09-27 NOTE — Progress Notes (Signed)
PROGRESS NOTE    Nicole [name]  EGB:[id number] DOB: [birth year] DOA: 09/24/2021 PCP: Jake Samples, PA-C   Brief Narrative:   Nicole [name] is a 75 y.o. female with a history of polymyalgia rheumatica with positive Rh factor and on chronic steroids, hypertension, obesity, chronic diastolic heart failure.  Patient recently admitted from 11/24 until 11/28 with nausea, abdominal pain, back pain.  She was found to have a 2 cm psoas abscess.  She was started on antibiotics and had improvement of her inflammatory numbers.  She was discharged on oral doxycycline to finish out a 7-day course which will complete next Monday.  Since that discharge, she has been at the [facility] and has had difficulty eating and drinking.  She continued to have some persistent nausea and vomiting with poor oral intake and developing dehydration and was admitted with the symptoms as well as AKI.  She continues to require ongoing, aggressive potassium supplementation on a daily basis.  AKI has improved.  Assessment & Plan:   Principal Problem:   Failure to thrive in adult Active Problems:   HTN (hypertension)   On prednisone therapy   Morbid obesity with BMI of 50.0-59.9, adult (HCC)   Chronic diastolic HF (heart failure) (HCC)   Hypokalemia   AKI (acute kidney injury) ([place])   Psoas abscess, left (2.0 cm on MRI)    Intractable nausea and vomiting; resolved -Continue on Zofran as needed -DC IV fluid 12/6   AKI-resolved -Prerenal from poor oral intake along with nausea and vomiting -Monitor creatinine   Severe hypokalemia-persistent -Replete IV and p.o. -Recheck BMP -Will need ongoing PO supplementation on DC   Left psoas abscess -Continue IV doxycycline day 7/7; dc after today   Chronic prednisone -Continue   History of hypertension -BP soft this a.m., hold metoprolol   Cervical DJD -With intermittent numbness to hands     DVT prophylaxis: Lovenox Code Status: Full Family Communication:  Discussed with daughter on phone 12/6 Disposition Plan:  Status is: Inpatient   Remains inpatient appropriate because: Continues on IV fluids and meds.     Consultants:  None   Procedures:  See below  Antimicrobials:  Anti-infectives (From admission, onward)    Start     Dose/Rate Route Frequency Ordered Stop   09/24/21 1900  doxycycline (VIBRAMYCIN) 100 mg in sodium chloride 0.9 % 250 mL IVPB        100 mg 125 mL/hr over 120 Minutes Intravenous Every 12 hours 09/24/21 1846 09/27/21 2359       Subjective: Patient seen and evaluated today with no new acute complaints or concerns. No acute concerns or events noted overnight. No more N/V noted  Objective: Vitals:   09/26/21 0517 09/26/21 1236 09/26/21 2103 09/27/21 0506  BP: 118/67 (!) 128/91 (!) 128/92 123/71  Pulse: 99 84 86 82  Resp: 18 18 18 18   Temp: 98.9 F (37.2 C) 98.4 F (36.9 C) 98.5 F (36.9 C) 97.9 F (36.6 C)  TempSrc: Oral Oral Oral Oral  SpO2: 94% 94% 97% 95%  Weight:      Height:        Intake/Output Summary (Last 24 hours) at 09/27/2021 1018 Last data filed at 09/27/2021 0700 Gross per 24 hour  Intake 4957.14 ml  Output 1800 ml  Net 3157.14 ml   Filed Weights   09/24/21 2051  Weight: 104.5 kg    Examination:  General exam: Appears calm and comfortable, obese Respiratory system: Clear to auscultation. Respiratory effort normal. Cardiovascular system:  S1 & S2 heard, RRR.  Gastrointestinal system: Abdomen is soft Central nervous system: Alert and awake Extremities: No edema Skin: No significant lesions noted Psychiatry: Flat affect.    Data Reviewed: I have personally reviewed following labs and imaging studies  CBC: Recent Labs  Lab 09/24/21 1611 09/25/21 0508 09/27/21 0636  WBC 11.2* 8.6 6.5  NEUTROABS 6.8  --   --   HGB 13.7 12.0 10.8*  HCT 39.5 35.5* 32.9*  MCV 91.9 93.2 98.2  PLT 419* 345 741   Basic Metabolic Panel: Recent Labs  Lab 09/24/21 1611 09/25/21 0508  09/25/21 1658 09/26/21 0356 09/27/21 0636  NA 132* 138 135 136 139  K 3.0* 2.3* 3.3* 2.2* 2.8*  CL 92* 102 102 105 109  CO2 20* 21* 21* 20* 21*  GLUCOSE 100* 79 97 78 82  BUN 58* 54* 50* 44* 24*  CREATININE 4.37* 3.34* 2.54* 1.83* 1.12*  CALCIUM 7.6* 7.1* 7.2* 7.1* 7.9*  MG  --   --   --  1.2* 2.2   GFR: Estimated Creatinine Clearance: 48.3 mL/min (A) (by C-G formula based on SCr of 1.12 mg/dL (H)). Liver Function Tests: Recent Labs  Lab 09/24/21 1746  AST 23  ALT 19  ALKPHOS 85  BILITOT 0.9  PROT 6.3*  ALBUMIN 3.2*   Recent Labs  Lab 09/24/21 1746  LIPASE 42   No results for input(s): AMMONIA in the last 168 hours. Coagulation Profile: No results for input(s): INR, PROTIME in the last 168 hours. Cardiac Enzymes: No results for input(s): CKTOTAL, CKMB, CKMBINDEX, TROPONINI in the last 168 hours. BNP (last 3 results) No results for input(s): PROBNP in the last 8760 hours. HbA1C: No results for input(s): HGBA1C in the last 72 hours. CBG: No results for input(s): GLUCAP in the last 168 hours. Lipid Profile: No results for input(s): CHOL, HDL, LDLCALC, TRIG, CHOLHDL, LDLDIRECT in the last 72 hours. Thyroid Function Tests: No results for input(s): TSH, T4TOTAL, FREET4, T3FREE, THYROIDAB in the last 72 hours. Anemia Panel: No results for input(s): VITAMINB12, FOLATE, FERRITIN, TIBC, IRON, RETICCTPCT in the last 72 hours. Sepsis Labs: Recent Labs  Lab 09/24/21 1746  LATICACIDVEN 1.4    Recent Results (from the past 240 hour(s))  Resp Panel by RT-PCR (Flu A&B, Covid) Nasopharyngeal Swab     Status: None   Collection Time: 09/24/21  4:20 PM   Specimen: Nasopharyngeal Swab; Nasopharyngeal(NP) swabs in vial transport medium  Result Value Ref Range Status   SARS Coronavirus 2 by RT PCR NEGATIVE NEGATIVE Final    Comment: (NOTE) SARS-CoV-2 target nucleic acids are NOT DETECTED.  The SARS-CoV-2 RNA is generally detectable in upper respiratory specimens during the  acute phase of infection. The lowest concentration of SARS-CoV-2 viral copies this assay can detect is 138 copies/mL. A negative result does not preclude SARS-Cov-2 infection and should not be used as the sole basis for treatment or other patient management decisions. A negative result may occur with  improper specimen collection/handling, submission of specimen other than nasopharyngeal swab, presence of viral mutation(s) within the areas targeted by this assay, and inadequate number of viral copies(<138 copies/mL). A negative result must be combined with clinical observations, patient history, and epidemiological information. The expected result is Negative.  Fact Sheet for Patients:  EntrepreneurPulse.com.au  Fact Sheet for Healthcare Providers:  IncredibleEmployment.be  This test is no t yet approved or cleared by the [place] FDA and  has been authorized for detection and/or diagnosis of SARS-CoV-2 by FDA under  an Emergency Use Authorization (EUA). This EUA will remain  in effect (meaning this test can be used) for the duration of the COVID-19 declaration under Section 564(b)(1) of the Act, 21 U.S.C.section 360bbb-3(b)(1), unless the authorization is terminated  or revoked sooner.       Influenza A by PCR NEGATIVE NEGATIVE Final   Influenza B by PCR NEGATIVE NEGATIVE Final    Comment: (NOTE) The Xpert Xpress SARS-CoV-2/FLU/RSV plus assay is intended as an aid in the diagnosis of influenza from Nasopharyngeal swab specimens and should not be used as a sole basis for treatment. Nasal washings and aspirates are unacceptable for Xpert Xpress SARS-CoV-2/FLU/RSV testing.  Fact Sheet for Patients: EntrepreneurPulse.com.au  Fact Sheet for Healthcare Providers: IncredibleEmployment.be  This test is not yet approved or cleared by the [place] FDA and has been authorized for detection and/or  diagnosis of SARS-CoV-2 by FDA under an Emergency Use Authorization (EUA). This EUA will remain in effect (meaning this test can be used) for the duration of the COVID-19 declaration under Section 564(b)(1) of the Act, 21 U.S.C. section 360bbb-3(b)(1), unless the authorization is terminated or revoked.  Performed at [facility], [address], [place], [place] [address]          Radiology Studies: No results found.      Scheduled Meds:  atorvastatin  20 mg Oral Daily   calcitRIOL  0.25 mcg Oral Once per day on Mon Wed Fri   enoxaparin (LOVENOX) injection  40 mg Subcutaneous Y57K   folic acid  1 mg Intravenous Daily   lactose free nutrition  237 mL Oral TID BM   leflunomide  10 mg Oral Daily   melatonin  3 mg Oral QHS   metoprolol succinate  25 mg Oral Daily   pantoprazole (PROTONIX) IV  40 mg Intravenous Q12H   predniSONE  9 mg Oral Q breakfast   tapentadol  100 mg Oral Q12H   thiamine injection  100 mg Intravenous Daily   traZODone  25 mg Oral QHS   Continuous Infusions:  doxycycline (VIBRAMYCIN) IV 100 mg (09/27/21 9355)   potassium chloride       LOS: 3 days    Time spent: 35 minutes    Gyanna Jarema Darleen Crocker, DO [facility]  If 7PM-7AM, please contact night-coverage [email or web address] 09/27/2021, 10:18 AM

## 2021-09-28 DIAGNOSIS — E876 Hypokalemia: Secondary | ICD-10-CM

## 2021-09-28 DIAGNOSIS — I5032 Chronic diastolic (congestive) heart failure: Secondary | ICD-10-CM

## 2021-09-28 DIAGNOSIS — K6812 Psoas muscle abscess: Secondary | ICD-10-CM

## 2021-09-28 DIAGNOSIS — Z6841 Body Mass Index (BMI) 40.0 and over, adult: Secondary | ICD-10-CM

## 2021-09-28 DIAGNOSIS — N179 Acute kidney failure, unspecified: Principal | ICD-10-CM

## 2021-09-28 LAB — CBC
HCT: 33.1 % — ABNORMAL LOW (ref 36.0–46.0)
Hemoglobin: 10.8 g/dL — ABNORMAL LOW (ref 12.0–15.0)
MCH: 31.9 pg (ref 26.0–34.0)
MCHC: 32.6 g/dL (ref 30.0–36.0)
MCV: 97.6 fL (ref 80.0–100.0)
Platelets: 312 10*3/uL (ref 150–400)
RBC: 3.39 MIL/uL — ABNORMAL LOW (ref 3.87–5.11)
RDW: 14.5 % (ref 11.5–15.5)
WBC: 5.7 10*3/uL (ref 4.0–10.5)
nRBC: 0 % (ref 0.0–0.2)

## 2021-09-28 LAB — BASIC METABOLIC PANEL
Anion gap: 8 (ref 5–15)
BUN: 15 mg/dL (ref 8–23)
CO2: 21 mmol/L — ABNORMAL LOW (ref 22–32)
Calcium: 8.4 mg/dL — ABNORMAL LOW (ref 8.9–10.3)
Chloride: 110 mmol/L (ref 98–111)
Creatinine, Ser: 0.94 mg/dL (ref 0.44–1.00)
GFR, Estimated: >60 mL/min (ref >60)
Glucose, Bld: 78 mg/dL (ref 70–99)
Potassium: 3.4 mmol/L — ABNORMAL LOW (ref 3.5–5.1)
Sodium: 139 mmol/L (ref 135–145)

## 2021-09-28 LAB — MAGNESIUM: Magnesium: 2.1 mg/dL (ref 1.7–2.4)

## 2021-09-28 MED ORDER — POTASSIUM CHLORIDE ER 10 MEQ PO TBCR: 10.0000 meq | EXTENDED_RELEASE_TABLET | Freq: Two times a day (BID) | ORAL | 0 refills | Status: DC

## 2021-09-28 NOTE — TOC Progression Note (Signed)
Transition of Care [facility]) - Progression Note    Patient Details  Name: Nicole [name] MRN: [id number] Date of Birth: 17-Sep-1946  Transition of Care [facility]) CM/SW [place], [place] Phone Number: 09/28/2021, 2:55 PM  Clinical Narrative:    CSW spoke to [facility] with [place] who stated she would be visiting pt to complete assessment. CSW just left room after meeting with pt and Melissa. Melissa Conservation officer, nature at facility states they cannot have pt return tonight due to late time and they will not be able to get medications. Melissa asked that Fl2 and D/C summary be sent early tomorrow morning. CSW updated MD of this. TOC to follow.   Expected Discharge Plan: Assisted Living Barriers to Discharge: Continued Medical Work up  Expected Discharge Plan and Services Expected Discharge Plan: Assisted Living In-house Referral: Clinical Social Work   Post Acute Care Choice: Resumption of Svcs/PTA Provider Living arrangements for the past 2 months: Assisted Living Facility                 DME Arranged: N/A DME Agency: NA       HH Arranged: PT       Representative spoke with at [place]: In house PT at [place] ([place]) Interventions    Readmission Risk Interventions Readmission Risk Prevention Plan 09/26/2021  Transportation Screening Complete  HRI or Home Care Consult Complete  Social Work Consult for [place] Planning/Counseling Complete  Palliative Care Screening Not Applicable  Medication Review Press photographer) Complete  Some recent data might be hidden

## 2021-09-28 NOTE — Plan of Care (Signed)
  Problem: Education: Goal: Knowledge of General Education information will improve Description: Including pain rating scale, medication(s)/side effects and non-pharmacologic comfort measures Outcome: Progressing   Problem: Health Behavior/Discharge Planning: Goal: Ability to manage health-related needs will improve Outcome: Progressing   Problem: Nutrition: Goal: Adequate nutrition will be maintained Outcome: Progressing   Problem: Activity: Goal: Risk for activity intolerance will decrease Outcome: Progressing   Problem: Coping: Goal: Level of anxiety will decrease Outcome: Progressing   Problem: Safety: Goal: Ability to remain free from injury will improve Outcome: Progressing

## 2021-09-28 NOTE — Discharge Summary (Signed)
Physician Discharge Summary  Nicole [name] BVQ:[id number] DOB: 15-Apr-1946 DOA: 09/24/2021  PCP: Jake Samples, PA-C  Admit date: 09/24/2021 Discharge date: 09/29/2021  Admitted From: ALF Disposition: ALF  Recommendations for Outpatient Follow-up:  Follow up with PCP in 1 week Please obtain BMP/CBC in one week Outpatient GI follow-up for consideration of upper endoscopy Please follow up on the following pending results: None  Home Health: PT Equipment/Devices: None  Discharge Condition: Stable CODE STATUS: Full code Diet recommendation: Heart healthy   Brief/Interim Summary:  Admission HPI written by Truett Mainland, DO   HPI: Nicole [name] is a 75 y.o. female with a history of polymyalgia rheumatica with positive Rh factor and on chronic steroids, hypertension, obesity, chronic diastolic heart failure.  Patient recently admitted from 11/24 until 11/28 with nausea, abdominal pain, back pain.  She was found to have a 2 cm psoas abscess.  She was started on antibiotics and had improvement of her inflammatory numbers.  She was discharged on oral doxycycline to finish out a 7-day course which will complete next Monday.  Since that discharge, she has been at the [facility] and has had difficulty eating and drinking.  She puts the food in her mouth but is unable to swallow the food.  She does get quite nauseated and vomits.  They discussed the condition with her PCP who gave her antinausea medications which were not helpful.  They also put her on an acid reducer, tried to get her to drink boost.  The assisted living facility got blood work today which showed some abnormalities and the patient was brought to the hospital for evaluation.   She does have some mild epigastric abdominal pain that is nonradiating and without palliating or provoking factors.   Hospital course:  Intractable nausea/vomiting Unknown etiology. Patient managed symptomatically and supportively with Zofran and  IV fluids. Now resolved. If symptoms return, recommend outpatient GI follow-up for consideration of upper endoscopy.   AKI Secondary to nausea/vomiting and resultant dehydration. Resolved with IV fluids.   Hypokalemia Secondary to nausea/vomiting. Previous history of hypokalemia likely secondary to diuretic use as an outpatient. Discharge with potassium supplementation.   Left psoas abscess Treatment completed. Repeat CT this admission with improvement.   Cervical degenerative joint disease Chronic.  Chronic diastolic heart failure Resume home diuretic regimen. Will need close monitoring to ensure no developing dehydration. Repeat BMP to ensure stable creatinine and potassium.   Morbid obesity Body mass index is 43.53 kg/m.   Discharge Diagnoses:  Principal Problem:   Intractable nausea and vomiting Active Problems:   HTN (hypertension)   On prednisone therapy   Morbid obesity with BMI of 50.0-59.9, adult (HCC)   Chronic diastolic HF (heart failure) (HCC)   Hypokalemia   AKI (acute kidney injury) ([place])   Psoas abscess, left (2.0 cm on MRI)     Discharge Instructions   Allergies as of 09/29/2021       Reactions   Oxycontin [oxycodone Hcl] Swelling   Pt tolerates hydromorphone.   Sulfa Antibiotics Other (See Comments)   Sores   Tramadol    "Makes me feel like I am falling"   Penicillins Rash   Sulfasalazine Other (See Comments)   Sores        Medication List     STOP taking these medications    amoxicillin-clavulanate 875-125 MG tablet Commonly known as: AUGMENTIN   doxycycline 100 MG capsule Commonly known as: VIBRAMYCIN       TAKE these medications  acetaminophen 325 MG tablet Commonly known as: TYLENOL Take 2 tablets (650 mg total) by mouth every 6 (six) hours as needed for mild pain or headache (or Fever >/= 101).   ALPRAZolam 0.25 MG tablet Commonly known as: XANAX Take 0.25 mg by mouth daily as needed.   atorvastatin 20 MG  tablet Commonly known as: LIPITOR Take 20 mg by mouth daily.   calcitRIOL 0.25 MCG capsule Commonly known as: ROCALTROL Take 0.25 mcg by mouth See admin instructions. Take 0.25 every Mon, Wed, Fri.   cyclobenzaprine 5 MG tablet Commonly known as: FLEXERIL Take 5 mg by mouth every 12 (twelve) hours as needed for muscle spasms.   diclofenac Sodium 1 % Gel Commonly known as: VOLTAREN Apply 2 g topically 4 (four) times daily as needed (pain).   GAS-X PO Take 1 tablet by mouth 3 (three) times daily as needed (flatulence).   lactose free nutrition Liqd Take 237 mLs by mouth 3 (three) times daily between meals.   leflunomide 10 MG tablet Commonly known as: ARAVA TAKE (1) TABLET BY MOUTH DAILY. What changed: See the new instructions.   lidocaine 5 % Commonly known as: LIDODERM Place 1-2 patches onto the skin every 12 (twelve) hours as needed (pain).   melatonin 3 MG Tabs tablet Take 3 mg by mouth at bedtime.   metolazone 5 MG tablet Commonly known as: ZAROXOLYN Take 5 mg by mouth 2 (two) times a week. Tuesday and Fridays   metoprolol succinate 25 MG 24 hr tablet Commonly known as: Toprol XL Take 1 tablet (25 mg total) by mouth daily.   MULTIVITAMIN PO Take 1 tablet by mouth daily.   Nucynta ER 200 MG Tb12 Generic drug: Tapentadol HCl Take 100 mg by mouth every 12 (twelve) hours.   ondansetron 4 MG tablet Commonly known as: ZOFRAN Take 4 mg by mouth every 8 (eight) hours as needed for nausea or vomiting.   pantoprazole 40 MG tablet Commonly known as: PROTONIX Take 1 tablet (40 mg total) by mouth daily.   potassium chloride 10 MEQ tablet Commonly known as: KLOR-CON Take 1 tablet (10 mEq total) by mouth 2 (two) times daily. While taking torsemide What changed:  when to take this additional instructions   predniSONE 1 MG tablet Commonly known as: DELTASONE Take 9 tablets (9 mg total) by mouth daily with breakfast.   Torsemide 40 MG Tabs Take 40 mg by mouth 2  (two) times daily.   traZODone 50 MG tablet Commonly known as: DESYREL Take 25 mg by mouth at bedtime.        Allergies  Allergen Reactions   Oxycontin [Oxycodone Hcl] Swelling    Pt tolerates hydromorphone.   Sulfa Antibiotics Other (See Comments)    Sores   Tramadol     "Makes me feel like I am falling"   Penicillins Rash   Sulfasalazine Other (See Comments)    Sores    Consultations: None   Procedures/Studies: CT ABDOMEN PELVIS WO CONTRAST  Result Date: 09/24/2021 CLINICAL DATA:  Abdominal abscess. EXAM: CT ABDOMEN AND PELVIS WITHOUT CONTRAST TECHNIQUE: Multidetector CT imaging of the abdomen and pelvis was performed following the standard protocol without IV contrast. COMPARISON:  September 15, 2021 FINDINGS: Lower chest: Atelectasis versus scarring in the left lung base. Calcific atherosclerotic disease of the aorta and coronary arteries. Hepatobiliary: No focal liver abnormality is seen. Status post cholecystectomy. No biliary dilatation. Pancreas: Unremarkable. No pancreatic ductal dilatation or surrounding inflammatory changes. Spleen: Normal in size without focal abnormality.  Adrenals/Urinary Tract: Adrenal glands are unremarkable. Kidneys are normal, without renal calculi, focal lesion, or hydronephrosis. Bladder is unremarkable. Stomach/Bowel: Stomach is within normal limits. Appendix appears normal. No evidence of bowel wall thickening, distention, or inflammatory changes. Vascular/Lymphatic: Aortic atherosclerosis. No enlarged abdominal or pelvic lymph nodes. Reproductive: Stable partially calcified left pelvic mass. Normal appearance of the right ovary and uterus. Other: No abdominal wall hernia or abnormality. No abdominopelvic ascites. Musculoskeletal: Marked arthritic changes of the lumbosacral spine. Scoliosis. Prior lumbar spine decompensation. Decreased in size gas and fluid collection adjacent to left L4-L5 disc space and extending into the left psoas muscle.  Prominent left-sided L4-L5 disc bulge. IMPRESSION: 1. Decreased in size known gas and fluid collection adjacent to left L4-L5 disc space, extending into the left psoas muscle. 2. Stable partially calcified left pelvic mass. 3. Calcific atherosclerotic disease of the aorta and coronary arteries. Aortic Atherosclerosis (ICD10-I70.0). Electronically Signed   By: Fidela Salisbury M.D.   On: 09/24/2021 18:13   MR LUMBAR SPINE WO CONTRAST  Result Date: 09/16/2021 CLINICAL DATA:  Back pain common gas seen in psoas muscle on recent CT EXAM: MRI LUMBAR SPINE WITHOUT CONTRAST TECHNIQUE: Multiplanar, multisequence MR imaging of the lumbar spine was performed. No intravenous contrast was administered. COMPARISON:  CT abdomen/pelvis dated 1 day prior, lumbar spine MRI 06/21/2021 FINDINGS: Segmentation: There is transitional anatomy with lumbarization of the S1 vertebral body. For the purposes of this report, there is a rudimentary S1-S2 disc space. Alignment:  There is trace retrolisthesis of L1 on L2 and L2 on L3. Vertebrae: There are postsurgical changes reflecting posterior decompression from L2 through L5. Vertebral body heights are preserved. There is multilevel degenerative endplate marrow signal abnormality throughout the lumbar spine. There is no suspicious marrow signal abnormality. There is no marrow edema to suggest infection. There is no definite evidence of septic arthritis. Conus medullaris and cauda equina: Conus extends to the L1-L2 level. Conus and cauda equina appear normal. Paraspinal and other soft tissues: There is a 2.0 cm x 0.8 cm by 1.5 cm fluid collection in the left psoas muscle at the L5 level. There may be an additional smaller abscess measuring up to 0.8 cm more superiorly at the L4 level (7-15, 11-18). The paraspinal soft tissues are otherwise unremarkable. Disc levels: There is multilevel intervertebral disc space narrowing. Marked T2 hypointensity in the anterior aspect of the L3-L4 and  L4-L5 disc spaces is consistent with vacuum disc phenomenon. There is advanced multilevel facet arthropathy with prominent effusions at L4-L5 and L5-S1, similar to the prior study. T12-L1: Is a mild disc bulge and ligamentum flavum thickening resulting in mild spinal canal stenosis and mild-to-moderate bilateral neural foraminal stenosis. L1-L2: Status post posterior decompression. There is a prominent diffuse disc bulge, degenerative endplate change, and bilateral facet arthropathy resulting in mild spinal canal stenosis and severe bilateral neural foraminal stenosis, unchanged. L2-L3: Status post posterior decompression. There is a mild disc bulge, degenerative endplate change, and bilateral facet arthropathy resulting in severe bilateral neural foraminal stenosis without significant spinal canal stenosis, unchanged. L3-L4: Status post posterior decompression. There is a prominent diffuse disc bulge, degenerative endplate change, and bilateral facet arthropathy resulting in severe bilateral neural foraminal stenosis without significant spinal canal stenosis, unchanged. L4-L5: Status post posterior decompression. There is a prominent diffuse disc bulge, degenerative endplate change, and bilateral facet arthropathy resulting in mild spinal canal stenosis and severe bilateral neural foraminal stenosis, unchanged. L5-S1: There is a diffuse disc bulge with a left subarticular zone annular  fissure, degenerative endplate change, and bilateral facet arthropathy resulting in severe left worse than right neural foraminal stenosis without significant spinal canal stenosis, unchanged. IMPRESSION: 1. Postcontrast images were not obtained as the patient could not complete the study. Within this confines: 2. 2.0 cm fluid collection in the left psoas muscle at the L5 level concerning for abscess with a possible additional smaller abscess at the L4 level. 3. No abnormal marrow edema to suggest adjacent discitis/osteomyelitis,  though as above, postcontrast images were not obtained. 4. Severe multilevel degenerative changes as above, not significantly changed since 06/21/2021. Electronically Signed   By: Valetta Mole M.D.   On: 09/16/2021 14:07   MR PELVIS WO CONTRAST  Result Date: 09/16/2021 CLINICAL DATA:  Intramuscular gas within the left iliopsoas muscle on CT. Severe back pain. EXAM: MRI PELVIS WITHOUT CONTRAST TECHNIQUE: Multiplanar multisequence MR imaging of the pelvis was performed. No intravenous contrast was administered. Patient was unable to continue the examination. No contrast was administered. Lumbar spine findings are dictated separately. COMPARISON:  Abdominopelvic CT 09/15/2021 and 06/21/2021. FINDINGS: Bones/Joint/Cartilage Within the pelvis, there is no evidence of acute fracture or dislocation. There are mild degenerative changes of both hips. No evidence of avascular necrosis or significant hip joint effusion. The sacroiliac joints are intact with mild degenerative changes on the left. There is no sacroiliac joint effusion or suspicious subchondral signal abnormality. There is multilevel lumbar spondylosis status post multilevel posterior decompression. Lumbar spine findings are dictated separately. Ligaments Not relevant for exam/indication. Muscles and Tendons Associated with the gas in the left iliopsoas muscle on recent CT is a small amount of muscular edema as well as a small fluid collection measuring up to 1.8 cm posterior to the muscle at the L5 level. No drainable fluid collection identified. Muscular edema extends into the pelvis without associated focal fluid collection. There is no significant fluid in the iliopsoas bursa. There is asymmetric fatty atrophy of the left gluteus, piriformis and hamstring muscles. No acute tendon tear identified. Soft tissues There is a chronic left adnexal mass measuring 3.7 cm on image 29/7. This demonstrates low T1 and T2 signal, corresponding with calcification on  CT. This mass appears grossly unchanged from CTs dating back to 08/20/2013. As above, left paraspinal inflammatory changes and gas within the ileus psoas muscle. No other focal fluid collections are identified. IMPRESSION: 1. There is a small amount of muscular edema within the left iliopsoas muscle, associated with a small paraspinal fluid collection at the L5 level. See separate examination of the lumbar spine. 2. No intrapelvic fluid collections. 3. Mild degenerative changes of the hips and sacroiliac joints. 4. Stable partially calcified left adnexal mass from at least 2014, consistent with a benign finding. Electronically Signed   By: Richardean Sale M.D.   On: 09/16/2021 13:50   CT Abdomen Pelvis W Contrast  Result Date: 09/15/2021 CLINICAL DATA:  75 year old female with acute LEFT abdominal and pelvic pain for 2 days. EXAM: CT ABDOMEN AND PELVIS WITH CONTRAST TECHNIQUE: Multidetector CT imaging of the abdomen and pelvis was performed using the standard protocol following bolus administration of intravenous contrast. CONTRAST:  18mL OMNIPAQUE IOHEXOL 300 MG/ML  SOLN COMPARISON:  06/21/2021 CT and prior studies FINDINGS: Lower chest: No acute abnormality Hepatobiliary: Mildly nodular hepatic contour or likely represent cirrhosis. No other hepatic abnormalities are noted. Patient is status post cholecystectomy. No biliary dilatation. Pancreas: Unremarkable Spleen: Unremarkable Adrenals/Urinary Tract: The kidneys, adrenal glands and bladder are unremarkable. No hydronephrosis or renal calculi. Stomach/Bowel: Scattered  diverticula within the descending and sigmoid colon noted without evidence of acute diverticulitis. There is no evidence of bowel obstruction or focal collection. The appendix is normal. Vascular/Lymphatic: Aortic atherosclerosis. No enlarged abdominal or pelvic lymph nodes. Reproductive: No new abnormalities. A 4 cm LEFT adnexal mass appears unchanged from remote studies. Other: No ascites or  pneumoperitoneum. Musculoskeletal: Again noted is moderate to severe multilevel degenerative disc disease, spondylosis and facet arthropathy throughout the lumbar spine with vacuum disc phenomenon. However, now noted are foci of gas within the LEFT psoas muscle which appears to extend from the L3-4 or L4-5 vacuum discs. No definite LEFT psoas focal collection or enhancement is identified. IMPRESSION: 1. New foci of gas within the LEFT psoas muscle which appears to extend from L3-4 or L4-5 degenerative vacuum discs. Although this may represent noninfectious extension of gas from the disc space, infection is not excluded although there is no definite evidence of focal collection/enhancement of the LEFT psoas muscle. Recommend MRI with and without contrast for further evaluation as clinically indicated. 2. Mildly nodular hepatic contour suspicious for cirrhosis. 3. Aortic Atherosclerosis (ICD10-I70.0). Electronically Signed   By: Margarette Canada M.D.   On: 09/15/2021 12:46   US Abdomen Limited RUQ (LIVER/GB)  Result Date: 09/16/2021 CLINICAL DATA:  Abdomen pain EXAM: ULTRASOUND ABDOMEN LIMITED RIGHT UPPER QUADRANT COMPARISON:  CT 09/15/2021. FINDINGS: Gallbladder: Status post cholecystectomy. Common bile duct: Diameter: 1.8 mm Liver: Slightly nodular hepatic contour. No focal hepatic abnormality. Liver is slightly echogenic. Portal vein is patent on color Doppler imaging with normal direction of blood flow towards the liver. Other: None. IMPRESSION: 1. Status post cholecystectomy without biliary dilatation 2. Slightly echogenic nodular liver suspicious for cirrhosis Electronically Signed   By: Donavan Foil M.D.   On: 09/16/2021 19:53      Subjective: Patient without emesis or nausea this morning.  Discharge Exam: Vitals:   09/28/21 2048 09/29/21 0404  BP: 124/67 (!) 146/81  Pulse: 81 89  Resp: 20 20  Temp: 98.7 F (37.1 C) 98.1 F (36.7 C)  SpO2: 96% 97%   Vitals:   09/28/21 1000 09/28/21 1440  09/28/21 2048 09/29/21 0404  BP: 122/69 110/66 124/67 (!) 146/81  Pulse: 91 90 81 89  Resp:  18 20 20   Temp:  97.9 F (36.6 C) 98.7 F (37.1 C) 98.1 F (36.7 C)  TempSrc:  Oral Oral Oral  SpO2:  98% 96% 97%  Weight:      Height:        General: Pt is alert, awake, not in acute distress Cardiovascular: RRR, S1/S2 +, no rubs, no gallops Respiratory: CTA bilaterally, no wheezing, no rhonchi Abdominal: Soft, mild periumbilical tenderness, ND, bowel sounds + Extremities: no cyanosis    The results of significant diagnostics from this hospitalization (including imaging, microbiology, ancillary and laboratory) are listed below for reference.     Microbiology: Recent Results (from the past 240 hour(s))  Resp Panel by RT-PCR (Flu A&B, Covid) Nasopharyngeal Swab     Status: None   Collection Time: 09/24/21  4:20 PM   Specimen: Nasopharyngeal Swab; Nasopharyngeal(NP) swabs in vial transport medium  Result Value Ref Range Status   SARS Coronavirus 2 by RT PCR NEGATIVE NEGATIVE Final    Comment: (NOTE) SARS-CoV-2 target nucleic acids are NOT DETECTED.  The SARS-CoV-2 RNA is generally detectable in upper respiratory specimens during the acute phase of infection. The lowest concentration of SARS-CoV-2 viral copies this assay can detect is 138 copies/mL. A negative result does not preclude  SARS-Cov-2 infection and should not be used as the sole basis for treatment or other patient management decisions. A negative result may occur with  improper specimen collection/handling, submission of specimen other than nasopharyngeal swab, presence of viral mutation(s) within the areas targeted by this assay, and inadequate number of viral copies(<138 copies/mL). A negative result must be combined with clinical observations, patient history, and epidemiological information. The expected result is Negative.  Fact Sheet for Patients:  EntrepreneurPulse.com.au  Fact Sheet for  Healthcare Providers:  IncredibleEmployment.be  This test is no t yet approved or cleared by the [place] FDA and  has been authorized for detection and/or diagnosis of SARS-CoV-2 by FDA under an Emergency Use Authorization (EUA). This EUA will remain  in effect (meaning this test can be used) for the duration of the COVID-19 declaration under Section 564(b)(1) of the Act, 21 U.S.C.section 360bbb-3(b)(1), unless the authorization is terminated  or revoked sooner.       Influenza A by PCR NEGATIVE NEGATIVE Final   Influenza B by PCR NEGATIVE NEGATIVE Final    Comment: (NOTE) The Xpert Xpress SARS-CoV-2/FLU/RSV plus assay is intended as an aid in the diagnosis of influenza from Nasopharyngeal swab specimens and should not be used as a sole basis for treatment. Nasal washings and aspirates are unacceptable for Xpert Xpress SARS-CoV-2/FLU/RSV testing.  Fact Sheet for Patients: EntrepreneurPulse.com.au  Fact Sheet for Healthcare Providers: IncredibleEmployment.be  This test is not yet approved or cleared by the [place] FDA and has been authorized for detection and/or diagnosis of SARS-CoV-2 by FDA under an Emergency Use Authorization (EUA). This EUA will remain in effect (meaning this test can be used) for the duration of the COVID-19 declaration under Section 564(b)(1) of the Act, 21 U.S.C. section 360bbb-3(b)(1), unless the authorization is terminated or revoked.  Performed at [facility], [address], [place], [place] [address]      Labs: BNP (last 3 results) No results for input(s): BNP in the last 8760 hours. Basic Metabolic Panel: Recent Labs  Lab 09/25/21 0508 09/25/21 1658 09/26/21 0356 09/27/21 0636 09/28/21 0456  NA 138 135 136 139 139  K 2.3* 3.3* 2.2* 2.8* 3.4*  CL 102 102 105 109 110  CO2 21* 21* 20* 21* 21*  GLUCOSE 79 97 78 82 78  BUN 54* 50* 44* 24* 15  CREATININE 3.34* 2.54* 1.83*  1.12* 0.94  CALCIUM 7.1* 7.2* 7.1* 7.9* 8.4*  MG  --   --  1.2* 2.2 2.1   Liver Function Tests: Recent Labs  Lab 09/24/21 1746  AST 23  ALT 19  ALKPHOS 85  BILITOT 0.9  PROT 6.3*  ALBUMIN 3.2*   Recent Labs  Lab 09/24/21 1746  LIPASE 42   No results for input(s): AMMONIA in the last 168 hours. CBC: Recent Labs  Lab 09/24/21 1611 09/25/21 0508 09/27/21 0636 09/28/21 0456  WBC 11.2* 8.6 6.5 5.7  NEUTROABS 6.8  --   --   --   HGB 13.7 12.0 10.8* 10.8*  HCT 39.5 35.5* 32.9* 33.1*  MCV 91.9 93.2 98.2 97.6  PLT 419* 345 259 312   Cardiac Enzymes: No results for input(s): CKTOTAL, CKMB, CKMBINDEX, TROPONINI in the last 168 hours. BNP: Invalid input(s): POCBNP CBG: No results for input(s): GLUCAP in the last 168 hours. D-Dimer No results for input(s): DDIMER in the last 72 hours. Hgb A1c No results for input(s): HGBA1C in the last 72 hours. Lipid Profile No results for input(s): CHOL, HDL, LDLCALC, TRIG, CHOLHDL,  LDLDIRECT in the last 72 hours. Thyroid function studies No results for input(s): TSH, T4TOTAL, T3FREE, THYROIDAB in the last 72 hours.  Invalid input(s): FREET3 Anemia work up No results for input(s): VITAMINB12, FOLATE, FERRITIN, TIBC, IRON, RETICCTPCT in the last 72 hours. Urinalysis    Component Value Date/Time   COLORURINE YELLOW 09/24/2021 1646   APPEARANCEUR CLOUDY (A) 09/24/2021 1646   LABSPEC 1.015 09/24/2021 1646   PHURINE 5.5 09/24/2021 1646   GLUCOSEU NEGATIVE 09/24/2021 1646   HGBUR NEGATIVE 09/24/2021 1646   BILIRUBINUR NEGATIVE 09/24/2021 1646   BILIRUBINUR negative 02/14/2020 1328   KETONESUR NEGATIVE 09/24/2021 1646   PROTEINUR NEGATIVE 09/24/2021 1646   UROBILINOGEN 0.2 02/14/2020 1328   UROBILINOGEN 0.2 02/20/2014 1116   NITRITE NEGATIVE 09/24/2021 1646   LEUKOCYTESUR NEGATIVE 09/24/2021 1646   Sepsis Labs Invalid input(s): PROCALCITONIN,  WBC,  LACTICIDVEN Microbiology Recent Results (from the past 240 hour(s))  Resp  Panel by RT-PCR (Flu A&B, Covid) Nasopharyngeal Swab     Status: None   Collection Time: 09/24/21  4:20 PM   Specimen: Nasopharyngeal Swab; Nasopharyngeal(NP) swabs in vial transport medium  Result Value Ref Range Status   SARS Coronavirus 2 by RT PCR NEGATIVE NEGATIVE Final    Comment: (NOTE) SARS-CoV-2 target nucleic acids are NOT DETECTED.  The SARS-CoV-2 RNA is generally detectable in upper respiratory specimens during the acute phase of infection. The lowest concentration of SARS-CoV-2 viral copies this assay can detect is 138 copies/mL. A negative result does not preclude SARS-Cov-2 infection and should not be used as the sole basis for treatment or other patient management decisions. A negative result may occur with  improper specimen collection/handling, submission of specimen other than nasopharyngeal swab, presence of viral mutation(s) within the areas targeted by this assay, and inadequate number of viral copies(<138 copies/mL). A negative result must be combined with clinical observations, patient history, and epidemiological information. The expected result is Negative.  Fact Sheet for Patients:  EntrepreneurPulse.com.au  Fact Sheet for Healthcare Providers:  IncredibleEmployment.be  This test is no t yet approved or cleared by the [place] FDA and  has been authorized for detection and/or diagnosis of SARS-CoV-2 by FDA under an Emergency Use Authorization (EUA). This EUA will remain  in effect (meaning this test can be used) for the duration of the COVID-19 declaration under Section 564(b)(1) of the Act, 21 U.S.C.section 360bbb-3(b)(1), unless the authorization is terminated  or revoked sooner.       Influenza A by PCR NEGATIVE NEGATIVE Final   Influenza B by PCR NEGATIVE NEGATIVE Final    Comment: (NOTE) The Xpert Xpress SARS-CoV-2/FLU/RSV plus assay is intended as an aid in the diagnosis of influenza from Nasopharyngeal  swab specimens and should not be used as a sole basis for treatment. Nasal washings and aspirates are unacceptable for Xpert Xpress SARS-CoV-2/FLU/RSV testing.  Fact Sheet for Patients: EntrepreneurPulse.com.au  Fact Sheet for Healthcare Providers: IncredibleEmployment.be  This test is not yet approved or cleared by the [place] FDA and has been authorized for detection and/or diagnosis of SARS-CoV-2 by FDA under an Emergency Use Authorization (EUA). This EUA will remain in effect (meaning this test can be used) for the duration of the COVID-19 declaration under Section 564(b)(1) of the Act, 21 U.S.C. section 360bbb-3(b)(1), unless the authorization is terminated or revoked.  Performed at [facility], [address], [place], [place] [address]      Time coordinating discharge: 35 minutes  SIGNED:   Cordelia Poche, MD [facility] 09/29/2021, 10:25  AM

## 2021-09-28 NOTE — Discharge Instructions (Signed)
Nicole [name],  You were in the hospital with nausea/vomiting. It is unknown why exactly you had this issue. It is possible it could be related to your infection or even maybe your antibiotics. Thankfully, your symptoms have improved. I have refilled your potassium prescription for you to take while you take your diuretic medication (torsemide and metolazone). Please follow-up with your primary care physician.

## 2021-09-28 NOTE — Progress Notes (Signed)
PROGRESS NOTE    Nicole [name]  NOI:[id number] DOB: [birth year] DOA: 09/24/2021 PCP: Jake Samples, PA-C   Brief Narrative: Nicole [name] is a 75 y.o. female with a history of polymyalgia rheumatica with positive Rh factor and on chronic steroids, hypertension, obesity, chronic diastolic heart failure.  Patient recently admitted from 11/24 until 11/28 with nausea, abdominal pain, back pain.  She was found to have a 2 cm psoas abscess.  She was started on antibiotics and had improvement of her inflammatory numbers.  She was discharged on oral doxycycline to finish out a 7-day course which will complete next Monday.  Since that discharge, she has been at the [facility] and has had difficulty eating and drinking.  She continued to have some persistent nausea and vomiting with poor oral intake and developing dehydration and was admitted with the symptoms as well as AKI.  She continues to require ongoing, aggressive potassium supplementation on a daily basis.  AKI has improved.   Assessment & Plan:   Principal Problem:   Failure to thrive in adult Active Problems:   HTN (hypertension)   On prednisone therapy   Morbid obesity with BMI of 50.0-59.9, adult (HCC)   Chronic diastolic HF (heart failure) (HCC)   Hypokalemia   AKI (acute kidney injury) ([place])   Psoas abscess, left (2.0 cm on MRI)    Intractable nausea/vomiting Unknown etiology. Patient managed symptomatically and supportively with Zofran and IV fluids. Now resolved.  AKI Secondary to nausea/vomiting and resultant dehydration. Resolved with IV fluids.  Hypokalemia Secondary to nausea/vomiting. Previous history of hypokalemia likely secondary to diuretic use as an outpatient.  Left psoas abscess Treatment completed. Repeat CT this admission with improvement.  Cervical degenerative joint disease Chronic.   DVT prophylaxis: Lovenox Code Status:   Code Status: Full Code Family Communication: Son on telephone Disposition  Plan: Discharge back to ALF likely in 24 hours   Consultants:  None  Procedures:  None  Antimicrobials: Doxycycline IV    Subjective: Patient reports no vomiting overnight or this morning. Some mild abdominal pain.  Objective: Vitals:   09/27/21 2136 09/28/21 0552 09/28/21 1000 09/28/21 1440  BP: 116/62 109/64 122/69 110/66  Pulse: 86 91 91 90  Resp: 18 20  18   Temp: 98 F (36.7 C) 97.8 F (36.6 C)  97.9 F (36.6 C)  TempSrc: Oral Oral  Oral  SpO2: 95% 98%  98%  Weight:      Height:        Intake/Output Summary (Last 24 hours) at 09/28/2021 1806 Last data filed at 09/28/2021 1300 Gross per 24 hour  Intake 1003.81 ml  Output 1650 ml  Net -646.19 ml   Filed Weights   09/24/21 2051  Weight: 104.5 kg    Examination:  General exam: Appears calm and comfortable Respiratory system: Clear to auscultation. Respiratory effort normal. Cardiovascular system: S1 & S2 heard, RRR. No murmurs, rubs, gallops or clicks. Gastrointestinal system: Abdomen is nondistended, soft and nontender. No organomegaly or masses felt. Normal bowel sounds heard. Central nervous system: Alert and oriented. No focal neurological deficits. Musculoskeletal: No edema. No calf tenderness Skin: No cyanosis. No rashes Psychiatry: Judgement and insight appear normal. Mood & affect appropriate.     Data Reviewed: I have personally reviewed following labs and imaging studies  CBC Lab Results  Component Value Date   WBC 5.7 09/28/2021   RBC 3.39 (L) 09/28/2021   HGB 10.8 (L) 09/28/2021   HCT 33.1 (L) 09/28/2021   MCV  97.6 09/28/2021   MCH 31.9 09/28/2021   PLT 312 09/28/2021   MCHC 32.6 09/28/2021   RDW 14.5 09/28/2021   LYMPHSABS 2.8 09/24/2021   MONOABS 1.4 (H) 09/24/2021   EOSABS 0.1 09/24/2021   BASOSABS 0.1 56/81/2751     Last metabolic panel Lab Results  Component Value Date   NA 139 09/28/2021   K 3.4 (L) 09/28/2021   CL 110 09/28/2021   CO2 21 (L) 09/28/2021   BUN 15  09/28/2021   CREATININE 0.94 09/28/2021   GLUCOSE 78 09/28/2021   GFRNONAA >60 09/28/2021   GFRAA 44 (L) 01/27/2021   CALCIUM 8.4 (L) 09/28/2021   PHOS 2.7 09/17/2021   PROT 6.3 (L) 09/24/2021   ALBUMIN 3.2 (L) 09/24/2021   LABGLOB 3.0 10/30/2019   AGRATIO 1.4 10/30/2019   BILITOT 0.9 09/24/2021   ALKPHOS 85 09/24/2021   AST 23 09/24/2021   ALT 19 09/24/2021   ANIONGAP 8 09/28/2021    CBG (last 3)  No results for input(s): GLUCAP in the last 72 hours.   GFR: Estimated Creatinine Clearance: 57.6 mL/min (by C-G formula based on SCr of 0.94 mg/dL).  Coagulation Profile: No results for input(s): INR, PROTIME in the last 168 hours.  Recent Results (from the past 240 hour(s))  Resp Panel by RT-PCR (Flu A&B, Covid) Nasopharyngeal Swab     Status: None   Collection Time: 09/24/21  4:20 PM   Specimen: Nasopharyngeal Swab; Nasopharyngeal(NP) swabs in vial transport medium  Result Value Ref Range Status   SARS Coronavirus 2 by RT PCR NEGATIVE NEGATIVE Final    Comment: (NOTE) SARS-CoV-2 target nucleic acids are NOT DETECTED.  The SARS-CoV-2 RNA is generally detectable in upper respiratory specimens during the acute phase of infection. The lowest concentration of SARS-CoV-2 viral copies this assay can detect is 138 copies/mL. A negative result does not preclude SARS-Cov-2 infection and should not be used as the sole basis for treatment or other patient management decisions. A negative result may occur with  improper specimen collection/handling, submission of specimen other than nasopharyngeal swab, presence of viral mutation(s) within the areas targeted by this assay, and inadequate number of viral copies(<138 copies/mL). A negative result must be combined with clinical observations, patient history, and epidemiological information. The expected result is Negative.  Fact Sheet for Patients:  EntrepreneurPulse.com.au  Fact Sheet for Healthcare Providers:   IncredibleEmployment.be  This test is no t yet approved or cleared by the [place] FDA and  has been authorized for detection and/or diagnosis of SARS-CoV-2 by FDA under an Emergency Use Authorization (EUA). This EUA will remain  in effect (meaning this test can be used) for the duration of the COVID-19 declaration under Section 564(b)(1) of the Act, 21 U.S.C.section 360bbb-3(b)(1), unless the authorization is terminated  or revoked sooner.       Influenza A by PCR NEGATIVE NEGATIVE Final   Influenza B by PCR NEGATIVE NEGATIVE Final    Comment: (NOTE) The Xpert Xpress SARS-CoV-2/FLU/RSV plus assay is intended as an aid in the diagnosis of influenza from Nasopharyngeal swab specimens and should not be used as a sole basis for treatment. Nasal washings and aspirates are unacceptable for Xpert Xpress SARS-CoV-2/FLU/RSV testing.  Fact Sheet for Patients: EntrepreneurPulse.com.au  Fact Sheet for Healthcare Providers: IncredibleEmployment.be  This test is not yet approved or cleared by the [place] FDA and has been authorized for detection and/or diagnosis of SARS-CoV-2 by FDA under an Emergency Use Authorization (EUA). This EUA will remain in effect (  meaning this test can be used) for the duration of the COVID-19 declaration under Section 564(b)(1) of the Act, 21 U.S.C. section 360bbb-3(b)(1), unless the authorization is terminated or revoked.  Performed at [facility], [address], [place], [place] [address]         Radiology Studies: No results found.      Scheduled Meds:  atorvastatin  20 mg Oral Daily   calcitRIOL  0.25 mcg Oral Once per day on Mon Wed Fri   enoxaparin (LOVENOX) injection  40 mg Subcutaneous U76L   folic acid  1 mg Intravenous Daily   lactose free nutrition  237 mL Oral TID BM   leflunomide  10 mg Oral Daily   melatonin  3 mg Oral QHS   metoprolol succinate  25 mg Oral Daily    pantoprazole (PROTONIX) IV  40 mg Intravenous Q12H   predniSONE  9 mg Oral Q breakfast   tapentadol  100 mg Oral Q12H   thiamine injection  100 mg Intravenous Daily   traZODone  25 mg Oral QHS   Continuous Infusions:   LOS: 4 days     Cordelia Poche, MD [facility] 09/28/2021, 6:06 PM  If 7PM-7AM, please contact night-coverage [email or web address]

## 2021-09-28 NOTE — Care Management Important Message (Signed)
Important Message  Patient Details  Name: Nicole [name] MRN: [id number] Date of Birth: [birth year]   Medicare Important Message Given:  Yes     Tommy Medal 09/28/2021, 11:46 AM

## 2021-09-29 DIAGNOSIS — R112 Nausea with vomiting, unspecified: Secondary | ICD-10-CM

## 2021-09-29 DIAGNOSIS — I10 Essential (primary) hypertension: Secondary | ICD-10-CM

## 2021-09-29 MED ORDER — ALPRAZOLAM 0.25 MG PO TABS: 0.2500 mg | ORAL_TABLET | Freq: Every day | ORAL | 0 refills | Status: DC | PRN

## 2021-09-29 NOTE — TOC Transition Note (Signed)
Transition of Care [facility]) - CM/SW Discharge Note   Patient Details  Name: Nicole [name] MRN: [id number] Date of Birth: [birth year]  Transition of Care [facility]) CM/SW Contact:  Iona Beard, [place] Phone Number: 09/29/2021, 10:34 AM   Clinical Narrative:    CSW spoke to [place] with [facility] who states they can accept pt today. She asked that CSW send over D/C summary and Fl2 with med list via fax [phone]. CSW to fax paperwork over once completed. Pts son in room and understanding of transition back to facility today. Pts son will be transferring pt to facility via his private vehicle. TOC signing off.   Final next level of care: Assisted Living Barriers to Discharge: Barriers Resolved   Patient Goals and CMS Choice Patient states their goals for this hospitalization and ongoing recovery are:: Return to ALF CMS Medicare.gov Compare Post Acute Care list provided to:: Patient Choice offered to / list presented to : Patient, Adult Children  Discharge Placement                Patient to be transferred to facility by: Patient's son Name of family member notified: Marites, Nath [phone] Patient and family notified of of transfer: 09/29/21  Discharge Plan and Services In-house Referral: Clinical Social Work   Post Acute Care Choice: Resumption of Svcs/PTA Provider          DME Arranged: N/A DME Agency: NA       HH Arranged: PT   Date HH Agency Contacted: 09/29/21   Representative spoke with at [place]: In house PT at [place] ([place]) Interventions     Readmission Risk Interventions Readmission Risk Prevention Plan 09/26/2021  Transportation Screening Complete  HRI or [place] Complete  Social Work Consult for [place] Planning/Counseling Complete  Palliative Care Screening Not Applicable  Medication Review Press photographer) Complete  Some recent data might be hidden

## 2021-09-29 NOTE — Evaluation (Signed)
Physical Therapy Evaluation Patient Details Name: Nicole [name] MRN: [id number] DOB: 1946-10-23 Today's Date: 09/29/2021  History of Present Illness  Nicole [name] is a 75 y.o. female with a history of polymyalgia rheumatica with positive Rh factor and on chronic steroids, hypertension, obesity, chronic diastolic heart failure.  Patient recently admitted from 11/24 until 11/28 with nausea, abdominal pain, back pain.  She was found to have a 2 cm psoas abscess.  She was started on antibiotics and had improvement of her inflammatory numbers.  She was discharged on oral doxycycline to finish out a 7-day course which will complete next Monday.  Since that discharge, she has been at the [facility] and has had difficulty eating and drinking.  She puts the food in her mouth but is unable to swallow the food.  She does get quite nauseated and vomits.  They discussed the condition with her PCP who gave her antinausea medications which were not helpful.  They also put her on an acid reducer, tried to get her to drink boost.  The assisted living facility got blood work today which showed some abnormalities and the patient was brought to the hospital for evaluation.   Clinical Impression  Patient functioning near baseline for functional mobility demonstrating good return for transferring to chair leaning on armrest with Min assist.  Patient non-ambulatory at baseline and uses wheelchair for mobility.  Plan:  Patient discharged from physical therapy to care of nursing for out of bed daily as tolerated for length of stay.          Recommendations for follow up therapy are one component of a multi-disciplinary discharge planning process, led by the attending physician.  Recommendations may be updated based on patient status, additional functional criteria and insurance authorization.  Follow Up Recommendations Home health PT    Assistance Recommended at Discharge Intermittent Supervision/Assistance  Functional  Status Assessment    Equipment Recommendations  None recommended by PT    Recommendations for Other Services       Precautions / Restrictions Precautions Precautions: Fall Restrictions Weight Bearing Restrictions: No      Mobility  Bed Mobility Overal bed mobility: Modified Independent             General bed mobility comments: increased time, with HOB raised    Transfers Overall transfer level: Needs assistance Equipment used: 1 person hand held assist Transfers: Sit to/from Stand;Bed to chair/wheelchair/BSC Sit to Stand: Min assist   Step pivot transfers: Min assist       General transfer comment: increased time labored movement, had to use bed rail and arm rest of chair    Ambulation/Gait Ambulation/Gait assistance: Mod assist Gait Distance (Feet): 3 Feet Assistive device: 1 person hand held assist Gait Pattern/deviations: Decreased step length - right;Decreased step length - left;Decreased stride length Gait velocity: Decreased     General Gait Details: limited to a few slow labored steps leaning on armrest of chair during transfer  Stairs            Wheelchair Mobility    Modified Rankin (Stroke Patients Only)       Balance Overall balance assessment: Needs assistance Sitting-balance support: Feet supported;No upper extremity supported Sitting balance-Leahy Scale: Good Sitting balance - Comments: seated at EOB   Standing balance support: Reliant on assistive device for balance;Bilateral upper extremity supported Standing balance-Leahy Scale: Poor Standing balance comment: leaning on arm rest of chair  Pertinent Vitals/Pain Pain Assessment: No/denies pain Breathing: normal    Home Living Family/patient expects to be discharged to:: Assisted living                 Home Equipment: BSC/3in1;Wheelchair - manual;Rollator (4 wheels)      Prior Function Prior Level of Function : Needs  assist       Physical Assist : Mobility (physical) Mobility (physical): Bed mobility;Transfers   Mobility Comments: Pt is wheelchair bound at baseline, uses RW for bathroom accessibility and transfers to wheelchair/bed. ADLs Comments: ALF staff assist with all ADLs     Hand Dominance   Dominant Hand: Right    Extremity/Trunk Assessment   Upper Extremity Assessment Upper Extremity Assessment: Overall WFL for tasks assessed    Lower Extremity Assessment Lower Extremity Assessment: Generalized weakness    Cervical / Trunk Assessment Cervical / Trunk Assessment: Kyphotic  Communication   Communication: No difficulties  Cognition Arousal/Alertness: Awake/alert Behavior During Therapy: WFL for tasks assessed/performed Overall Cognitive Status: Within Functional Limits for tasks assessed                                          General Comments      Exercises     Assessment/Plan    PT Assessment All further PT needs can be met in the next venue of care  PT Problem List Decreased strength;Decreased activity tolerance;Decreased balance;Decreased mobility       PT Treatment Interventions      PT Goals (Current goals can be found in the Care Plan section)  Acute Rehab PT Goals Patient Stated Goal: Return to ALF PT Goal Formulation: With patient/family Time For Goal Achievement: 09/29/21 Potential to Achieve Goals: Good    Frequency     Barriers to discharge        Co-evaluation               AM-PAC PT "6 Clicks" Mobility  Outcome Measure Help needed turning from your back to your side while in a flat bed without using bedrails?: None Help needed moving from lying on your back to sitting on the side of a flat bed without using bedrails?: A Little Help needed moving to and from a bed to a chair (including a wheelchair)?: A Little Help needed standing up from a chair using your arms (e.g., wheelchair or bedside chair)?: A Little Help  needed to walk in hospital room?: A Little Help needed climbing 3-5 steps with a railing? : A Lot 6 Click Score: 18    End of Session   Activity Tolerance: Patient tolerated treatment well;Patient limited by fatigue Patient left: in chair;with call bell/phone within reach Nurse Communication: Mobility status PT Visit Diagnosis: Unsteadiness on feet (R26.81);Other abnormalities of gait and mobility (R26.89);Muscle weakness (generalized) (M62.81)    Time: 5449-2010 PT Time Calculation (min) (ACUTE ONLY): 13 min   Charges:   PT Evaluation $PT Eval Low Complexity: 1 Low PT Treatments $Therapeutic Activity: 8-22 mins        1:48 PM, 09/29/21 Lonell Grandchild, MPT Physical Therapist with [facility] [phone] office [phone] mobile phone

## 2021-09-29 NOTE — Plan of Care (Signed)
  Problem: Education: Goal: Knowledge of General Education information will improve Description Including pain rating scale, medication(s)/side effects and non-pharmacologic comfort measures Outcome: Progressing   Problem: Health Behavior/Discharge Planning: Goal: Ability to manage health-related needs will improve Outcome: Progressing   

## 2021-09-29 NOTE — NC FL2 (Signed)
[place] LEVEL OF CARE SCREENING TOOL     IDENTIFICATION  Patient Name: Nicole [name] Birthdate: 26-Oct-1945 Sex: female Admission Date (Current Location): 09/24/2021  [facility] and [place] Number:  [employer] and Address:  [place] [address], [place]      Provider Number: [id number]  Attending Physician Name and Address:  Mariel Aloe, MD  Relative Name and Phone Number:  Vonzell Schlatter (Daughter)   [phone]    Current Level of Care: Hospital Recommended Level of Care: Assisted Living Facility Prior Approval Number:    Date Approved/Denied:   PASRR Number:    Discharge Plan: Other (Comment) (ALF)    Current Diagnoses: Patient Active Problem List   Diagnosis Date Noted   Intractable nausea and vomiting 09/24/2021   Myositis 09/16/2021   Psoas abscess, left (2.0 cm on MRI)  09/16/2021   AKI (acute kidney injury) ([place]) 09/38/1829   Acute metabolic encephalopathy 93/71/6967   Hypokalemia 07/20/2021   Elevated MCV 07/20/2021   Mixed hyperlipidemia 07/20/2021   Diverticulitis 05/02/2018   Diverticulitis of colon 05/01/2018   Morbid obesity with BMI of 50.0-59.9, adult ([place]) 05/01/2018   Chronic diastolic HF (heart failure) ([place]) 05/01/2018   On prednisone therapy 05/31/2017   Positive anti-CCP test 05/31/2017   Rheumatoid factor positive 05/31/2017   Memory loss 04/04/2017   Polymyalgia rheumatica ([place]) 04/04/2017   High risk medication use 12/07/2016   DDD (degenerative disc disease), lumbar 09/27/2016   HTN (hypertension) 06/05/2016   Chronic pain syndrome 06/05/2016   Acute diverticulitis 06/02/2016   Diverticulitis large intestine 06/02/2016   Spinal stenosis of lumbosacral region 02/29/2016   Joint pain 02/29/2016   Endometrial polyp 02/06/2014   Postmenopausal bleeding 01/30/2014   GERD (gastroesophageal reflux disease) 09/29/2013   Spinal stenosis, thoracic 09/29/2013   Shingles 09/29/2013    Thoracic or lumbosacral neuritis or radiculitis, unspecified 05/04/2011   Abnormality of gait 05/04/2011   Muscle weakness (generalized) 05/04/2011   Bilateral primary osteoarthritis of knee 04/07/2009   KNEE PAIN 04/07/2009    Orientation RESPIRATION BLADDER Height & Weight     Self, Time, Situation, Place  Normal Continent Weight: 230 lb 6.1 oz (104.5 kg) Height:  5\' 1"  (154.9 cm)  BEHAVIORAL SYMPTOMS/MOOD NEUROLOGICAL BOWEL NUTRITION STATUS      Continent Diet (Dysphagia 1 with thin liquids.)  AMBULATORY STATUS COMMUNICATION OF NEEDS Skin   Limited Assist Verbally Bruising                       Personal Care Assistance Level of Assistance  Bathing, Feeding, Dressing Bathing Assistance: Limited assistance Feeding assistance: Limited assistance Dressing Assistance: Limited assistance     Functional Limitations Info  Sight, Hearing, Speech Sight Info: Impaired Hearing Info: Adequate Speech Info: Adequate    SPECIAL CARE FACTORS FREQUENCY  PT (By licensed PT)     PT Frequency: in house PT              Contractures Contractures Info: Not present    Additional Factors Info  Code Status, Allergies, Psychotropic Code Status Info: FULL Allergies Info: Oxycontin (oxycodone Hcl), Sulfa Antibiotics, Tramadol, Penicillins, Sulfasalazine Psychotropic Info: Xanax, Trazodone, Nucynta         Current Medications (09/29/2021):  This is the current hospital active medication list Current Facility-Administered Medications  Medication Dose Route Frequency Provider Last Rate Last Admin   acetaminophen (TYLENOL) tablet 650 mg  650 mg Oral Q6H PRN Truett Mainland,  DO   650 mg at 09/29/21 1025   ALPRAZolam (XANAX) tablet 0.25 mg  0.25 mg Oral Daily PRN Truett Mainland, DO   0.25 mg at 09/28/21 1011   atorvastatin (LIPITOR) tablet 20 mg  20 mg Oral Daily Truett Mainland, DO   20 mg at 09/29/21 1700   calcitRIOL (ROCALTROL) capsule 0.25 mcg  0.25 mcg Oral Once per day on Mon  Wed Fri Truett Mainland, DO   0.25 mcg at 09/28/21 1013   enoxaparin (LOVENOX) injection 40 mg  40 mg Subcutaneous Q24H Heath Lark D, DO   40 mg at 17/49/44 9675   folic acid injection 1 mg  1 mg Intravenous Daily Truett Mainland, DO   1 mg at 09/29/21 9163   HYDROcodone-acetaminophen (NORCO/VICODIN) 5-325 MG per tablet 1 tablet  1 tablet Oral Q6H PRN Truett Mainland, DO   1 tablet at 09/29/21 0522   lactose free nutrition (Boost) liquid 237 mL  237 mL Oral TID BM Manuella Ghazi, Pratik D, DO   237 mL at 09/29/21 0920   leflunomide (ARAVA) tablet 10 mg  10 mg Oral Daily Heath Lark D, DO   10 mg at 09/29/21 8466   melatonin tablet 3 mg  3 mg Oral QHS Manuella Ghazi, Pratik D, DO   3 mg at 09/28/21 2152   metoprolol succinate (TOPROL-XL) 24 hr tablet 25 mg  25 mg Oral Daily Truett Mainland, DO   25 mg at 09/29/21 0914   ondansetron (ZOFRAN) tablet 4 mg  4 mg Oral Q6H PRN Truett Mainland, DO   4 mg at 09/27/21 5993   Or   ondansetron (ZOFRAN) injection 4 mg  4 mg Intravenous Q6H PRN Truett Mainland, DO   4 mg at 09/27/21 1950   pantoprazole (PROTONIX) injection 40 mg  40 mg Intravenous Q12H Truett Mainland, DO   40 mg at 09/29/21 5701   predniSONE (DELTASONE) tablet 9 mg  9 mg Oral Q breakfast Truett Mainland, DO   9 mg at 09/29/21 7793   tapentadol (NUCYNTA) 12 hr tablet 100 mg  100 mg Oral Q12H Shah, Pratik D, DO   100 mg at 09/29/21 9030   thiamine (B-1) injection 100 mg  100 mg Intravenous Daily Truett Mainland, DO   100 mg at 09/29/21 0923   traZODone (DESYREL) tablet 25 mg  25 mg Oral QHS Shah, Pratik D, DO   25 mg at 09/28/21 2153     Discharge Medications: TAKE these medications     acetaminophen 325 MG tablet Commonly known as: TYLENOL Take 2 tablets (650 mg total) by mouth every 6 (six) hours as needed for mild pain or headache (or Fever >/= 101).    ALPRAZolam 0.25 MG tablet Commonly known as: XANAX Take 0.25 mg by mouth daily as needed.    atorvastatin 20 MG tablet Commonly known as:  LIPITOR Take 20 mg by mouth daily.    calcitRIOL 0.25 MCG capsule Commonly known as: ROCALTROL Take 0.25 mcg by mouth See admin instructions. Take 0.25 every Mon, Wed, Fri.    cyclobenzaprine 5 MG tablet Commonly known as: FLEXERIL Take 5 mg by mouth every 12 (twelve) hours as needed for muscle spasms.    diclofenac Sodium 1 % Gel Commonly known as: VOLTAREN Apply 2 g topically 4 (four) times daily as needed (pain).    GAS-X PO Take 1 tablet by mouth 3 (three) times daily as needed (flatulence).    lactose free  nutrition Liqd Take 237 mLs by mouth 3 (three) times daily between meals.    leflunomide 10 MG tablet Commonly known as: ARAVA TAKE (1) TABLET BY MOUTH DAILY. What changed: See the new instructions.    lidocaine 5 % Commonly known as: LIDODERM Place 1-2 patches onto the skin every 12 (twelve) hours as needed (pain).    melatonin 3 MG Tabs tablet Take 3 mg by mouth at bedtime.    metolazone 5 MG tablet Commonly known as: ZAROXOLYN Take 5 mg by mouth 2 (two) times a week. Tuesday and Fridays    metoprolol succinate 25 MG 24 hr tablet Commonly known as: Toprol XL Take 1 tablet (25 mg total) by mouth daily.    MULTIVITAMIN PO Take 1 tablet by mouth daily.    Nucynta ER 200 MG Tb12 Generic drug: Tapentadol HCl Take 100 mg by mouth every 12 (twelve) hours.    ondansetron 4 MG tablet Commonly known as: ZOFRAN Take 4 mg by mouth every 8 (eight) hours as needed for nausea or vomiting.    pantoprazole 40 MG tablet Commonly known as: PROTONIX Take 1 tablet (40 mg total) by mouth daily.    potassium chloride 10 MEQ tablet Commonly known as: KLOR-CON Take 1 tablet (10 mEq total) by mouth 2 (two) times daily. While taking torsemide What changed:  when to take this additional instructions    predniSONE 1 MG tablet Commonly known as: DELTASONE Take 9 tablets (9 mg total) by mouth daily with breakfast.    Torsemide 40 MG Tabs Take 40 mg by mouth 2 (two) times  daily.    traZODone 50 MG tablet Commonly known as: DESYREL Take 25 mg by mouth at bedtime.    Relevant Imaging Results:  Relevant Lab Results:   Additional Information PT SSN: [id number]  Iona Beard, [place]

## 2021-09-29 NOTE — Progress Notes (Signed)
Patient to be discharged to [facility]. Report called and given to [facility]. All questions were answered and no further questions at this time. Patient is in stable condition and in no acute distress at this time. Patient will be transported to facility by her son.

## 2021-09-30 DIAGNOSIS — M353 Polymyalgia rheumatica: Secondary | ICD-10-CM | POA: Diagnosis not present

## 2021-09-30 DIAGNOSIS — M6281 Muscle weakness (generalized): Secondary | ICD-10-CM | POA: Diagnosis not present

## 2021-09-30 DIAGNOSIS — G894 Chronic pain syndrome: Secondary | ICD-10-CM | POA: Diagnosis not present

## 2021-09-30 DIAGNOSIS — M1380 Other specified arthritis, unspecified site: Secondary | ICD-10-CM | POA: Diagnosis not present

## 2021-10-03 ENCOUNTER — Inpatient Hospital Stay (HOSPITAL_COMMUNITY): Payer: Medicare Other

## 2021-10-03 ENCOUNTER — Other Ambulatory Visit: Payer: Self-pay

## 2021-10-03 ENCOUNTER — Inpatient Hospital Stay (HOSPITAL_COMMUNITY)
Admission: EM | Admit: 2021-10-03 | Discharge: 2021-10-08 | DRG: 682 | Disposition: A | Discharge status: Skilled Nursing Facility | Payer: Medicare Other | Attending: Internal Medicine | Admitting: Internal Medicine

## 2021-10-03 DIAGNOSIS — K921 Melena: Secondary | ICD-10-CM | POA: Diagnosis not present

## 2021-10-03 DIAGNOSIS — I517 Cardiomegaly: Secondary | ICD-10-CM | POA: Diagnosis not present

## 2021-10-03 DIAGNOSIS — R1032 Left lower quadrant pain: Secondary | ICD-10-CM | POA: Diagnosis not present

## 2021-10-03 DIAGNOSIS — R1013 Epigastric pain: Secondary | ICD-10-CM | POA: Diagnosis not present

## 2021-10-03 DIAGNOSIS — G4700 Insomnia, unspecified: Secondary | ICD-10-CM | POA: Diagnosis not present

## 2021-10-03 DIAGNOSIS — K224 Dyskinesia of esophagus: Secondary | ICD-10-CM | POA: Diagnosis present

## 2021-10-03 DIAGNOSIS — N179 Acute kidney failure, unspecified: Principal | ICD-10-CM | POA: Diagnosis present

## 2021-10-03 DIAGNOSIS — D649 Anemia, unspecified: Secondary | ICD-10-CM | POA: Diagnosis not present

## 2021-10-03 DIAGNOSIS — E782 Mixed hyperlipidemia: Secondary | ICD-10-CM | POA: Diagnosis present

## 2021-10-03 DIAGNOSIS — Z20822 Contact with and (suspected) exposure to covid-19: Secondary | ICD-10-CM | POA: Diagnosis present

## 2021-10-03 DIAGNOSIS — Z79899 Other long term (current) drug therapy: Secondary | ICD-10-CM

## 2021-10-03 DIAGNOSIS — E860 Dehydration: Secondary | ICD-10-CM | POA: Diagnosis present

## 2021-10-03 DIAGNOSIS — R102 Pelvic and perineal pain: Secondary | ICD-10-CM | POA: Diagnosis not present

## 2021-10-03 DIAGNOSIS — M48061 Spinal stenosis, lumbar region without neurogenic claudication: Secondary | ICD-10-CM | POA: Diagnosis not present

## 2021-10-03 DIAGNOSIS — K449 Diaphragmatic hernia without obstruction or gangrene: Secondary | ICD-10-CM | POA: Diagnosis not present

## 2021-10-03 DIAGNOSIS — R627 Adult failure to thrive: Secondary | ICD-10-CM | POA: Diagnosis not present

## 2021-10-03 DIAGNOSIS — M069 Rheumatoid arthritis, unspecified: Secondary | ICD-10-CM | POA: Diagnosis not present

## 2021-10-03 DIAGNOSIS — R112 Nausea with vomiting, unspecified: Secondary | ICD-10-CM | POA: Diagnosis present

## 2021-10-03 DIAGNOSIS — Z8249 Family history of ischemic heart disease and other diseases of the circulatory system: Secondary | ICD-10-CM

## 2021-10-03 DIAGNOSIS — I5032 Chronic diastolic (congestive) heart failure: Secondary | ICD-10-CM | POA: Diagnosis present

## 2021-10-03 DIAGNOSIS — R197 Diarrhea, unspecified: Secondary | ICD-10-CM | POA: Diagnosis not present

## 2021-10-03 DIAGNOSIS — R194 Change in bowel habit: Secondary | ICD-10-CM | POA: Diagnosis present

## 2021-10-03 DIAGNOSIS — F411 Generalized anxiety disorder: Secondary | ICD-10-CM | POA: Diagnosis not present

## 2021-10-03 DIAGNOSIS — K7689 Other specified diseases of liver: Secondary | ICD-10-CM | POA: Diagnosis not present

## 2021-10-03 DIAGNOSIS — R279 Unspecified lack of coordination: Secondary | ICD-10-CM | POA: Diagnosis not present

## 2021-10-03 DIAGNOSIS — Z833 Family history of diabetes mellitus: Secondary | ICD-10-CM

## 2021-10-03 DIAGNOSIS — E785 Hyperlipidemia, unspecified: Secondary | ICD-10-CM | POA: Diagnosis not present

## 2021-10-03 DIAGNOSIS — E861 Hypovolemia: Secondary | ICD-10-CM | POA: Diagnosis present

## 2021-10-03 DIAGNOSIS — Z888 Allergy status to other drugs, medicaments and biological substances status: Secondary | ICD-10-CM

## 2021-10-03 DIAGNOSIS — Z885 Allergy status to narcotic agent status: Secondary | ICD-10-CM

## 2021-10-03 DIAGNOSIS — Z9049 Acquired absence of other specified parts of digestive tract: Secondary | ICD-10-CM

## 2021-10-03 DIAGNOSIS — K319 Disease of stomach and duodenum, unspecified: Secondary | ICD-10-CM | POA: Diagnosis present

## 2021-10-03 DIAGNOSIS — K219 Gastro-esophageal reflux disease without esophagitis: Secondary | ICD-10-CM | POA: Diagnosis not present

## 2021-10-03 DIAGNOSIS — E876 Hypokalemia: Secondary | ICD-10-CM | POA: Diagnosis not present

## 2021-10-03 DIAGNOSIS — M1990 Unspecified osteoarthritis, unspecified site: Secondary | ICD-10-CM | POA: Diagnosis not present

## 2021-10-03 DIAGNOSIS — R1310 Dysphagia, unspecified: Secondary | ICD-10-CM | POA: Diagnosis not present

## 2021-10-03 DIAGNOSIS — R0902 Hypoxemia: Secondary | ICD-10-CM | POA: Diagnosis not present

## 2021-10-03 DIAGNOSIS — Z7952 Long term (current) use of systemic steroids: Secondary | ICD-10-CM

## 2021-10-03 DIAGNOSIS — K5790 Diverticulosis of intestine, part unspecified, without perforation or abscess without bleeding: Secondary | ICD-10-CM | POA: Diagnosis not present

## 2021-10-03 DIAGNOSIS — M545 Low back pain, unspecified: Secondary | ICD-10-CM | POA: Diagnosis not present

## 2021-10-03 DIAGNOSIS — M353 Polymyalgia rheumatica: Secondary | ICD-10-CM | POA: Diagnosis present

## 2021-10-03 DIAGNOSIS — R109 Unspecified abdominal pain: Secondary | ICD-10-CM | POA: Diagnosis not present

## 2021-10-03 DIAGNOSIS — E46 Unspecified protein-calorie malnutrition: Secondary | ICD-10-CM | POA: Diagnosis present

## 2021-10-03 DIAGNOSIS — F419 Anxiety disorder, unspecified: Secondary | ICD-10-CM | POA: Diagnosis not present

## 2021-10-03 DIAGNOSIS — G894 Chronic pain syndrome: Secondary | ICD-10-CM | POA: Diagnosis not present

## 2021-10-03 DIAGNOSIS — Z882 Allergy status to sulfonamides status: Secondary | ICD-10-CM

## 2021-10-03 DIAGNOSIS — I110 Hypertensive heart disease with heart failure: Secondary | ICD-10-CM | POA: Diagnosis present

## 2021-10-03 DIAGNOSIS — F332 Major depressive disorder, recurrent severe without psychotic features: Secondary | ICD-10-CM | POA: Diagnosis not present

## 2021-10-03 DIAGNOSIS — K6812 Psoas muscle abscess: Secondary | ICD-10-CM | POA: Diagnosis not present

## 2021-10-03 DIAGNOSIS — K649 Unspecified hemorrhoids: Secondary | ICD-10-CM | POA: Diagnosis present

## 2021-10-03 DIAGNOSIS — Z792 Long term (current) use of antibiotics: Secondary | ICD-10-CM | POA: Diagnosis not present

## 2021-10-03 DIAGNOSIS — R2689 Other abnormalities of gait and mobility: Secondary | ICD-10-CM | POA: Diagnosis not present

## 2021-10-03 DIAGNOSIS — K429 Umbilical hernia without obstruction or gangrene: Secondary | ICD-10-CM | POA: Diagnosis not present

## 2021-10-03 DIAGNOSIS — G9009 Other idiopathic peripheral autonomic neuropathy: Secondary | ICD-10-CM | POA: Diagnosis not present

## 2021-10-03 DIAGNOSIS — I10 Essential (primary) hypertension: Secondary | ICD-10-CM | POA: Diagnosis not present

## 2021-10-03 DIAGNOSIS — M4804 Spinal stenosis, thoracic region: Secondary | ICD-10-CM | POA: Diagnosis not present

## 2021-10-03 DIAGNOSIS — R1312 Dysphagia, oropharyngeal phase: Secondary | ICD-10-CM | POA: Diagnosis not present

## 2021-10-03 DIAGNOSIS — M5136 Other intervertebral disc degeneration, lumbar region: Secondary | ICD-10-CM | POA: Diagnosis not present

## 2021-10-03 DIAGNOSIS — F32A Depression, unspecified: Secondary | ICD-10-CM | POA: Diagnosis not present

## 2021-10-03 DIAGNOSIS — M1380 Other specified arthritis, unspecified site: Secondary | ICD-10-CM | POA: Diagnosis not present

## 2021-10-03 DIAGNOSIS — Z8261 Family history of arthritis: Secondary | ICD-10-CM

## 2021-10-03 DIAGNOSIS — Z880 Allergy status to penicillin: Secondary | ICD-10-CM

## 2021-10-03 DIAGNOSIS — Z959 Presence of cardiac and vascular implant and graft, unspecified: Secondary | ICD-10-CM | POA: Diagnosis not present

## 2021-10-03 DIAGNOSIS — I959 Hypotension, unspecified: Secondary | ICD-10-CM | POA: Diagnosis not present

## 2021-10-03 DIAGNOSIS — Z7401 Bed confinement status: Secondary | ICD-10-CM | POA: Diagnosis not present

## 2021-10-03 DIAGNOSIS — R932 Abnormal findings on diagnostic imaging of liver and biliary tract: Secondary | ICD-10-CM | POA: Diagnosis not present

## 2021-10-03 DIAGNOSIS — I700 Atherosclerosis of aorta: Secondary | ICD-10-CM | POA: Diagnosis not present

## 2021-10-03 DIAGNOSIS — M6281 Muscle weakness (generalized): Secondary | ICD-10-CM | POA: Diagnosis not present

## 2021-10-03 DIAGNOSIS — Z6841 Body Mass Index (BMI) 40.0 and over, adult: Secondary | ICD-10-CM

## 2021-10-03 DIAGNOSIS — E8770 Fluid overload, unspecified: Secondary | ICD-10-CM

## 2021-10-03 LAB — CBC
HCT: 43.9 % (ref 36.0–46.0)
Hemoglobin: 14.5 g/dL (ref 12.0–15.0)
MCH: 31.7 pg (ref 26.0–34.0)
MCHC: 33 g/dL (ref 30.0–36.0)
MCV: 96.1 fL (ref 80.0–100.0)
Platelets: 343 10*3/uL (ref 150–400)
RBC: 4.57 MIL/uL (ref 3.87–5.11)
RDW: 15.1 % (ref 11.5–15.5)
WBC: 10.2 10*3/uL (ref 4.0–10.5)
nRBC: 0 % (ref 0.0–0.2)

## 2021-10-03 LAB — COMPREHENSIVE METABOLIC PANEL
ALT: 25 U/L (ref 0–44)
AST: 30 U/L (ref 15–41)
Albumin: 3.4 g/dL — ABNORMAL LOW (ref 3.5–5.0)
Alkaline Phosphatase: 98 U/L (ref 38–126)
Anion gap: 13 (ref 5–15)
BUN: 27 mg/dL — ABNORMAL HIGH (ref 8–23)
CO2: 29 mmol/L (ref 22–32)
Calcium: 9.1 mg/dL (ref 8.9–10.3)
Chloride: 99 mmol/L (ref 98–111)
Creatinine, Ser: 2.23 mg/dL — ABNORMAL HIGH (ref 0.44–1.00)
GFR, Estimated: 22 mL/min — ABNORMAL LOW (ref >60)
Glucose, Bld: 106 mg/dL — ABNORMAL HIGH (ref 70–99)
Potassium: 3.3 mmol/L — ABNORMAL LOW (ref 3.5–5.1)
Sodium: 141 mmol/L (ref 135–145)
Total Bilirubin: 1 mg/dL (ref 0.3–1.2)
Total Protein: 7.1 g/dL (ref 6.5–8.1)

## 2021-10-03 LAB — RESP PANEL BY RT-PCR (FLU A&B, COVID) ARPGX2
Influenza A by PCR: NEGATIVE
Influenza B by PCR: NEGATIVE
SARS Coronavirus 2 by RT PCR: NEGATIVE

## 2021-10-03 LAB — LACTIC ACID, PLASMA
Lactic Acid, Venous: 1.7 mmol/L (ref 0.5–1.9)
Lactic Acid, Venous: 1.6 mmol/L (ref 0.5–1.9)

## 2021-10-03 LAB — LIPASE, BLOOD: Lipase: 37 U/L (ref 11–51)

## 2021-10-03 MED ORDER — CALCITRIOL 0.25 MCG PO CAPS
0.2500 ug | ORAL_CAPSULE | ORAL | Status: DC
  Administered 2021-10-05 – 2021-10-07 (×2): 0.25 ug via ORAL
  Filled 2021-10-03: qty 1

## 2021-10-03 MED ORDER — ONDANSETRON HCL 4 MG/2ML IJ SOLN: 4.0000 mg | Freq: Four times a day (QID) | INTRAMUSCULAR | Status: DC | PRN

## 2021-10-03 MED ORDER — ATORVASTATIN CALCIUM 20 MG PO TABS
20.0000 mg | ORAL_TABLET | Freq: Every day | ORAL | Status: DC
  Administered 2021-10-04 – 2021-10-08 (×5): 20 mg via ORAL
  Filled 2021-10-03 (×6): qty 1

## 2021-10-03 MED ORDER — SODIUM CHLORIDE 0.9 % IV SOLN
12.5000 mg | Freq: Four times a day (QID) | INTRAVENOUS | Status: DC | PRN
  Administered 2021-10-07: 12.5 mg via INTRAVENOUS
  Filled 2021-10-03: qty 0.5

## 2021-10-03 MED ORDER — ONDANSETRON HCL 4 MG PO TABS: 4.0000 mg | ORAL_TABLET | Freq: Four times a day (QID) | ORAL | Status: DC | PRN

## 2021-10-03 MED ORDER — METOPROLOL SUCCINATE ER 25 MG PO TB24
25.0000 mg | ORAL_TABLET | Freq: Every day | ORAL | Status: DC
  Administered 2021-10-04 – 2021-10-08 (×5): 25 mg via ORAL
  Filled 2021-10-03 (×5): qty 1

## 2021-10-03 MED ORDER — SODIUM CHLORIDE 0.9 % IV BOLUS
1000.0000 mL | Freq: Once | INTRAVENOUS | Status: AC
  Administered 2021-10-03: 1000 mL via INTRAVENOUS

## 2021-10-03 MED ORDER — POTASSIUM CHLORIDE 10 MEQ/100ML IV SOLN
10.0000 meq | INTRAVENOUS | Status: AC
  Administered 2021-10-03 (×3): 10 meq via INTRAVENOUS
  Filled 2021-10-03 (×2): qty 100

## 2021-10-03 MED ORDER — ACETAMINOPHEN 650 MG RE SUPP: 650.0000 mg | Freq: Four times a day (QID) | RECTAL | Status: DC | PRN

## 2021-10-03 MED ORDER — PROMETHAZINE HCL 12.5 MG PO TABS
12.5000 mg | ORAL_TABLET | Freq: Four times a day (QID) | ORAL | Status: DC | PRN
  Administered 2021-10-04: 12.5 mg via ORAL
  Filled 2021-10-03: qty 1

## 2021-10-03 MED ORDER — HEPARIN SODIUM (PORCINE) 5000 UNIT/ML IJ SOLN
5000.0000 [IU] | Freq: Three times a day (TID) | INTRAMUSCULAR | Status: DC
  Administered 2021-10-03 – 2021-10-08 (×12): 5000 [IU] via SUBCUTANEOUS
  Filled 2021-10-03 (×14): qty 1

## 2021-10-03 MED ORDER — TRAZODONE HCL 50 MG PO TABS
25.0000 mg | ORAL_TABLET | Freq: Every day | ORAL | Status: DC
  Administered 2021-10-03 – 2021-10-07 (×5): 25 mg via ORAL
  Filled 2021-10-03 (×5): qty 1

## 2021-10-03 MED ORDER — ONDANSETRON HCL 4 MG/2ML IJ SOLN
4.0000 mg | Freq: Once | INTRAMUSCULAR | Status: AC
  Administered 2021-10-03: 4 mg via INTRAVENOUS
  Filled 2021-10-03: qty 2

## 2021-10-03 MED ORDER — MELATONIN 3 MG PO TABS
6.0000 mg | ORAL_TABLET | Freq: Every day | ORAL | Status: DC
  Administered 2021-10-03 – 2021-10-07 (×5): 6 mg via ORAL
  Filled 2021-10-03 (×5): qty 2

## 2021-10-03 MED ORDER — ACETAMINOPHEN 325 MG PO TABS
650.0000 mg | ORAL_TABLET | Freq: Four times a day (QID) | ORAL | Status: DC | PRN
  Administered 2021-10-04 – 2021-10-07 (×4): 650 mg via ORAL
  Filled 2021-10-03 (×4): qty 2

## 2021-10-03 MED ORDER — ALPRAZOLAM 0.25 MG PO TABS
0.2500 mg | ORAL_TABLET | Freq: Two times a day (BID) | ORAL | Status: DC | PRN
  Administered 2021-10-04 – 2021-10-07 (×4): 0.25 mg via ORAL
  Filled 2021-10-03 (×5): qty 1

## 2021-10-03 MED ORDER — MORPHINE SULFATE (PF) 2 MG/ML IV SOLN
2.0000 mg | INTRAVENOUS | Status: DC | PRN
  Administered 2021-10-04 – 2021-10-08 (×17): 2 mg via INTRAVENOUS
  Filled 2021-10-03 (×20): qty 1

## 2021-10-03 NOTE — ED Notes (Signed)
Son Nicole [name]  [phone]

## 2021-10-03 NOTE — ED Triage Notes (Signed)
Intermittent abd pain x1 mo for which she has been seen prior, increased pain today. Low abd pain with diarrhea, increased weakness, diarrhea, emesis. Denies blood in stool or vomit.

## 2021-10-03 NOTE — ED Provider Notes (Signed)
[place] Provider Note   CSN: [id number] Arrival date & time: 10/03/21  1530     History No chief complaint on file.   Nicole [name] is a 75 y.o. female.  Patient with history of polymyalgia rheumatica with positive Rh factor and on chronic steroids, hypertension, obesity, chronic diastolic heart failure presents today with nausea, vomiting, and diarrhea. She states that same has been ongoing problem for which she has been admitted twice in the past month. The first time at the end of November they found a psoas muscle abscess, she was then treated with doxycycline. Readmitted on 12/03 and discharged on 12/08 for similar findings with intractable nausea and vomiting with AKI. CT scan revealed improving psoas muscle abscess. She states that she was feeling much better when she was discharged but that her condition acutely worsened in the past 2 days and she has had several episodes of nausea and vomiting and diarrhea without relief from her home antiemetic medication.  In the past few weeks for similar symptoms  The history is provided by the patient. No language interpreter was used.      Past Medical History:  Diagnosis Date   AC (acromioclavicular) joint bone spurs    lt shoulder   Anemia    Arthritis    Carpal tunnel syndrome, bilateral    CHF (congestive heart failure) (HCC)    Collagen vascular disease (HCC)    Gastroesophageal reflux    Headache    recent visit to ER @ [facility] for severe headache   Hypertension    Lumbar stenosis    Hx of ESIs by Dr. Nelva Bush   Polyarthralgia    Polymyalgia [facility])    Shingles    Spinal stenosis     Patient Active Problem List   Diagnosis Date Noted   Intractable nausea and vomiting 09/24/2021   Myositis 09/16/2021   Psoas abscess, left (2.0 cm on MRI)  09/16/2021   AKI (acute kidney injury) ([place]) 16/96/7893   Acute metabolic encephalopathy 81/10/7508   Hypokalemia 07/20/2021   Elevated MCV 07/20/2021    Mixed hyperlipidemia 07/20/2021   Diverticulitis 05/02/2018   Diverticulitis of colon 05/01/2018   Morbid obesity with BMI of 50.0-59.9, adult ([place]) 05/01/2018   Chronic diastolic HF (heart failure) ([place]) 05/01/2018   On prednisone therapy 05/31/2017   Positive anti-CCP test 05/31/2017   Rheumatoid factor positive 05/31/2017   Memory loss 04/04/2017   Polymyalgia rheumatica ([place]) 04/04/2017   High risk medication use 12/07/2016   DDD (degenerative disc disease), lumbar 09/27/2016   HTN (hypertension) 06/05/2016   Chronic pain syndrome 06/05/2016   Acute diverticulitis 06/02/2016   Diverticulitis large intestine 06/02/2016   Spinal stenosis of lumbosacral region 02/29/2016   Joint pain 02/29/2016   Endometrial polyp 02/06/2014   Postmenopausal bleeding 01/30/2014   GERD (gastroesophageal reflux disease) 09/29/2013   Spinal stenosis, thoracic 09/29/2013   Shingles 09/29/2013   Thoracic or lumbosacral neuritis or radiculitis, unspecified 05/04/2011   Abnormality of gait 05/04/2011   Muscle weakness (generalized) 05/04/2011   Bilateral primary osteoarthritis of knee 04/07/2009   KNEE PAIN 04/07/2009    Past Surgical History:  Procedure Laterality Date   BACK SURGERY  07/05/2018, 07/2018   x2    BIOPSY  09/22/2020   Procedure: BIOPSY;  Surgeon: Rogene Houston, MD;  [facility]: [facility];  Service: Endoscopy;;   CHOLECYSTECTOMY     COLONOSCOPY  06/11/2012   Procedure: COLONOSCOPY;  Surgeon: Jamesetta So, MD;  Location:  [facility];  Service: Gastroenterology;  Laterality: N/A;   COLONOSCOPY WITH PROPOFOL N/A 09/22/2020   Procedure: COLONOSCOPY WITH PROPOFOL;  Surgeon: Rogene Houston, MD;  Location: [facility];  Service: Endoscopy;  Laterality: N/A;  730   HYSTEROSCOPY WITH D & C N/A 02/25/2014   Procedure: DILATATION AND CURETTAGE /HYSTEROSCOPY;  Surgeon: Florian Buff, MD;  Location: [facility] ORS;  Service: Gynecology;  Laterality: N/A;   POLYPECTOMY N/A 02/25/2014   Procedure:  POLYPECTOMY;  Surgeon: Florian Buff, MD;  Location: [facility];  Service: Gynecology;  Laterality: N/A;   RESECTION DISTAL CLAVICAL Right 03/26/2015   Procedure: OPEN DISTAL CLAVICAL RESECTION ;  Surgeon: Netta Cedars, MD;  Location: [place];  Service: Orthopedics;  Laterality: Right;     OB History     Gravida  2   Para      Term      Preterm      AB      Living  2      SAB      IAB      Ectopic      Multiple      Live Births              Family History  Problem Relation Age of Onset   Hypertension Mother    Pneumonia Father    Arthritis Sister    Diabetes Paternal Grandmother     Social History   Tobacco Use   Smoking status: Never   Smokeless tobacco: Never  Vaping Use   Vaping Use: Never used  Substance Use Topics   Alcohol use: No   Drug use: No    Home Medications Prior to Admission medications   Medication Sig Start Date End Date Taking? Authorizing Provider  acetaminophen (TYLENOL) 325 MG tablet Take 2 tablets (650 mg total) by mouth every 6 (six) hours as needed for mild pain or headache (or Fever >/= 101). 05/06/18   Barton Dubois, MD  ALPRAZolam Duanne Moron) 0.25 MG tablet Take 1 tablet (0.25 mg total) by mouth daily as needed. 09/29/21   Mariel Aloe, MD  atorvastatin (LIPITOR) 20 MG tablet Take 20 mg by mouth daily. 01/13/21   [provider]  calcitRIOL (ROCALTROL) 0.25 MCG capsule Take 0.25 mcg by mouth See admin instructions. Take 0.25 every Mon, Wed, Fri. 09/06/20   [provider]  cyclobenzaprine (FLEXERIL) 5 MG tablet Take 5 mg by mouth every 12 (twelve) hours as needed for muscle spasms.  02/20/20   [provider]  diclofenac Sodium (VOLTAREN) 1 % GEL Apply 2 g topically 4 (four) times daily as needed (pain).     [provider]  lactose free nutrition (BOOST) LIQD Take 237 mLs by mouth 3 (three) times daily between meals.    [provider]  leflunomide (ARAVA) 10 MG tablet TAKE (1) TABLET BY  MOUTH DAILY. Patient taking differently: Take 10 mg by mouth daily. 12/27/20   Ofilia Neas, PA-C  lidocaine (LIDODERM) 5 % Place 1-2 patches onto the skin every 12 (twelve) hours as needed (pain). 02/19/20   [provider]  melatonin 3 MG TABS tablet Take 3 mg by mouth at bedtime.    [provider]  metolazone (ZAROXOLYN) 5 MG tablet Take 5 mg by mouth 2 (two) times a week. Tuesday and Fridays    [provider]  metoprolol succinate (TOPROL XL) 25 MG 24 hr tablet Take 1 tablet (25 mg total) by mouth daily.  07/21/21 07/21/22  Roxan Hockey, MD  Multiple Vitamins-Minerals (MULTIVITAMIN PO) Take 1 tablet by mouth daily.     [provider]  NUCYNTA ER 200 MG TB12 Take 100 mg by mouth every 12 (twelve) hours. 09/14/21   [provider]  ondansetron (ZOFRAN) 4 MG tablet Take 4 mg by mouth every 8 (eight) hours as needed for nausea or vomiting. 09/22/21   [provider]  pantoprazole (PROTONIX) 40 MG tablet Take 1 tablet (40 mg total) by mouth daily. 09/19/21   Johnson, Clanford L, MD  potassium chloride (KLOR-CON) 10 MEQ tablet Take 1 tablet (10 mEq total) by mouth 2 (two) times daily. While taking torsemide 09/28/21 10/28/21  Mariel Aloe, MD  predniSONE (DELTASONE) 1 MG tablet Take 9 tablets (9 mg total) by mouth daily with breakfast. 07/22/21   Roxan Hockey, MD  Simethicone (GAS-X PO) Take 1 tablet by mouth 3 (three) times daily as needed (flatulence).     [provider]  Torsemide 40 MG TABS Take 40 mg by mouth 2 (two) times daily. 09/21/21   Johnson, Clanford L, MD  traZODone (DESYREL) 50 MG tablet Take 25 mg by mouth at bedtime. 09/22/21   [provider]    Allergies    Oxycontin [oxycodone hcl], Sulfa antibiotics, Tramadol, Penicillins, and Sulfasalazine  Review of Systems   Review of Systems  Constitutional:  Negative for chills and fever.  HENT:  Negative for congestion and rhinorrhea.   Respiratory:  Negative  for cough and shortness of breath.   Cardiovascular:  Negative for chest pain.  Gastrointestinal:  Positive for abdominal pain, diarrhea, nausea and vomiting.  Genitourinary:  Negative for dysuria.  Neurological:  Negative for dizziness, tremors, seizures, syncope, facial asymmetry, speech difficulty, weakness, light-headedness, numbness and headaches.  Psychiatric/Behavioral:  Negative for confusion and decreased concentration.   All other systems reviewed and are negative.  Physical Exam Updated Vital Signs BP (!) 129/95   Pulse (!) 109   Temp 98.7 F (37.1 C) (Oral)   Resp 19   SpO2 96%   Physical Exam Vitals and nursing note reviewed.  Constitutional:      Appearance: Normal appearance. She is obese.     Comments: Patient laying in bed somewhat ill appearing in no acute distress  Cardiovascular:     Rate and Rhythm: Regular rhythm. Tachycardia present.     Heart sounds: Normal heart sounds.  Pulmonary:     Effort: Pulmonary effort is normal. No respiratory distress.     Breath sounds: Normal breath sounds.  Abdominal:     General: Abdomen is flat. Bowel sounds are normal.     Palpations: Abdomen is soft.     Tenderness: There is abdominal tenderness in the left lower quadrant.  Musculoskeletal:        General: Normal range of motion.  Skin:    General: Skin is warm and dry.  Neurological:     General: No focal deficit present.     Mental Status: She is alert.  Psychiatric:        Mood and Affect: Mood normal.        Behavior: Behavior normal.    ED Results / Procedures / Treatments   Labs (all labs ordered are listed, but only abnormal results are displayed) Labs Reviewed  COMPREHENSIVE METABOLIC PANEL - Abnormal; Notable for the following components:      Result Value   Potassium 3.3 (*)    Glucose, Bld 106 (*)    BUN  27 (*)    Creatinine, Ser 2.23 (*)    Albumin 3.4 (*)    GFR, Estimated 22 (*)    All other components within normal limits  RESP PANEL BY  RT-PCR (FLU A&B, COVID) ARPGX2  LIPASE, BLOOD  CBC  LACTIC ACID, PLASMA  URINALYSIS, ROUTINE W REFLEX MICROSCOPIC  LACTIC ACID, PLASMA    EKG None  Radiology No results found.  Procedures Procedures   Medications Ordered in ED Medications  potassium chloride 10 mEq in 100 mL IVPB (has no administration in time range)  sodium chloride 0.9 % bolus 1,000 mL (1,000 mLs Intravenous New Bag/Given 10/03/21 1845)  ondansetron (ZOFRAN) injection 4 mg (4 mg Intravenous Given 10/03/21 1850)    ED Course  I have reviewed the triage vital signs and the nursing notes.  Pertinent labs & imaging results that were available during my care of the patient were reviewed by me and considered in my medical decision making (see chart for details).  Clinical Course as of 10/03/21 1915  Mon Oct 04, 2975  1753 74 year old female here with nausea vomiting and left lower quadrant pain that sounds recurrent.  She is tachycardic although nontoxic-appearing.  Labs show new AKI.  She is tender in her left lower quadrant.  No white count no fever.  May need repeat imaging.  Will need admission. [MB]    Clinical Course User Index [MB] Hayden Rasmussen, MD   MDM Rules/Calculators/A&P                         Patient presents today with nausea vomiting and diarrhea with abdominal pain. She also endorses associated weakness and significantly decreased oral intake. She has been admitted twice in the past few weeks for similar symptoms. Had originally found psoas muscle abscess which was treated and has since resolved. When she was discharged on 12/08 she had resolution of symptoms and plan was to reintroduce foods in the following days. Her symptoms returned 2-3 days ago with new worsening LLQ pain. She is afebrile.  Labs reveal that patient has new worsening AKI with BUN 15 --> 27 and creatinine 0.94 --> 2.23. After fluids and IV Zofran patient denies any improvement of symptoms.  Labs reveal no leukocytosis or  anemia. Lactic acid 1.7. She is tachycardic. CT abdomen pelvis wo contrast ordered for further evaluation.  Patient will require admission for AKI, new onset LLQ pain with intractable nausea. CT scan pending.  Discussed this with patient who is amenable with plan.  Called hospitalist who agrees to admit.   This is a shared visit with supervising physician Dr. Melina Copa who has independently evaluated patient & provided guidance in evaluation/management/disposition, in agreement with care     Final Clinical Impression(s) / ED Diagnoses Final diagnoses:  Acute kidney injury ([place])  Intractable nausea and vomiting    Rx / DC Orders ED Discharge Orders     None        Nestor Lewandowsky 10/03/21 1956    Hayden Rasmussen, MD 10/04/21 1103

## 2021-10-04 ENCOUNTER — Ambulatory Visit (INDEPENDENT_AMBULATORY_CARE_PROVIDER_SITE_OTHER): Payer: Medicare Other | Admitting: Gastroenterology

## 2021-10-04 ENCOUNTER — Encounter (HOSPITAL_COMMUNITY): Payer: Self-pay | Admitting: Family Medicine

## 2021-10-04 DIAGNOSIS — K649 Unspecified hemorrhoids: Secondary | ICD-10-CM

## 2021-10-04 DIAGNOSIS — R1032 Left lower quadrant pain: Secondary | ICD-10-CM

## 2021-10-04 DIAGNOSIS — N179 Acute kidney failure, unspecified: Principal | ICD-10-CM

## 2021-10-04 DIAGNOSIS — R932 Abnormal findings on diagnostic imaging of liver and biliary tract: Secondary | ICD-10-CM

## 2021-10-04 LAB — COMPREHENSIVE METABOLIC PANEL
ALT: 21 U/L (ref 0–44)
AST: 23 U/L (ref 15–41)
Albumin: 2.9 g/dL — ABNORMAL LOW (ref 3.5–5.0)
Alkaline Phosphatase: 78 U/L (ref 38–126)
Anion gap: 15 (ref 5–15)
BUN: 27 mg/dL — ABNORMAL HIGH (ref 8–23)
CO2: 22 mmol/L (ref 22–32)
Calcium: 8.3 mg/dL — ABNORMAL LOW (ref 8.9–10.3)
Chloride: 104 mmol/L (ref 98–111)
Creatinine, Ser: 2.28 mg/dL — ABNORMAL HIGH (ref 0.44–1.00)
GFR, Estimated: 22 mL/min — ABNORMAL LOW (ref >60)
Glucose, Bld: 88 mg/dL (ref 70–99)
Potassium: 3 mmol/L — ABNORMAL LOW (ref 3.5–5.1)
Sodium: 141 mmol/L (ref 135–145)
Total Bilirubin: 1 mg/dL (ref 0.3–1.2)
Total Protein: 5.8 g/dL — ABNORMAL LOW (ref 6.5–8.1)

## 2021-10-04 LAB — CBC WITH DIFFERENTIAL/PLATELET
Abs Immature Granulocytes: 0.03 10*3/uL (ref 0.00–0.07)
Basophils Absolute: 0.1 10*3/uL (ref 0.0–0.1)
Basophils Relative: 1 %
Eosinophils Absolute: 0.3 10*3/uL (ref 0.0–0.5)
Eosinophils Relative: 3 %
HCT: 36.9 % (ref 36.0–46.0)
Hemoglobin: 12 g/dL (ref 12.0–15.0)
Immature Granulocytes: 0 %
Lymphocytes Relative: 27 %
Lymphs Abs: 2 10*3/uL (ref 0.7–4.0)
MCH: 31.1 pg (ref 26.0–34.0)
MCHC: 32.5 g/dL (ref 30.0–36.0)
MCV: 95.6 fL (ref 80.0–100.0)
Monocytes Absolute: 0.9 10*3/uL (ref 0.1–1.0)
Monocytes Relative: 12 %
Neutro Abs: 4.1 10*3/uL (ref 1.7–7.7)
Neutrophils Relative %: 57 %
Platelets: 303 10*3/uL (ref 150–400)
RBC: 3.86 MIL/uL — ABNORMAL LOW (ref 3.87–5.11)
RDW: 15 % (ref 11.5–15.5)
WBC: 7.4 10*3/uL (ref 4.0–10.5)
nRBC: 0 % (ref 0.0–0.2)

## 2021-10-04 LAB — PROTIME-INR
INR: 1 (ref 0.8–1.2)
Prothrombin Time: 13.6 seconds (ref 11.4–15.2)

## 2021-10-04 LAB — TSH: TSH: 0.982 u[IU]/mL (ref 0.350–4.500)

## 2021-10-04 LAB — POTASSIUM: Potassium: 3.9 mmol/L (ref 3.5–5.1)

## 2021-10-04 LAB — MAGNESIUM
Magnesium: 1.3 mg/dL — ABNORMAL LOW (ref 1.7–2.4)
Magnesium: 2 mg/dL (ref 1.7–2.4)

## 2021-10-04 MED ORDER — PANTOPRAZOLE SODIUM 40 MG PO TBEC
40.0000 mg | DELAYED_RELEASE_TABLET | Freq: Every day | ORAL | Status: DC
  Administered 2021-10-04 – 2021-10-08 (×5): 40 mg via ORAL
  Filled 2021-10-04 (×4): qty 1

## 2021-10-04 MED ORDER — MAGNESIUM SULFATE 2 GM/50ML IV SOLN
2.0000 g | Freq: Once | INTRAVENOUS | Status: AC
  Administered 2021-10-04: 2 g via INTRAVENOUS
  Filled 2021-10-04: qty 50

## 2021-10-04 MED ORDER — SODIUM CHLORIDE 0.9 % IV SOLN: INTRAVENOUS | Status: DC

## 2021-10-04 MED ORDER — POTASSIUM CHLORIDE 10 MEQ/100ML IV SOLN
10.0000 meq | Freq: Once | INTRAVENOUS | Status: AC
  Administered 2021-10-04: 10 meq via INTRAVENOUS

## 2021-10-04 MED ORDER — HYDROCORTISONE (PERIANAL) 2.5 % EX CREA
1.0000 "application " | TOPICAL_CREAM | Freq: Two times a day (BID) | CUTANEOUS | Status: DC
  Administered 2021-10-04 – 2021-10-08 (×8): 1 via RECTAL
  Filled 2021-10-04: qty 28.35

## 2021-10-04 MED ORDER — POTASSIUM CHLORIDE 10 MEQ/100ML IV SOLN
INTRAVENOUS | Status: AC
  Filled 2021-10-04: qty 100

## 2021-10-04 MED ORDER — POTASSIUM CHLORIDE 10 MEQ/100ML IV SOLN
10.0000 meq | INTRAVENOUS | Status: DC
  Filled 2021-10-04: qty 100

## 2021-10-04 MED ORDER — POTASSIUM CHLORIDE 10 MEQ/100ML IV SOLN
10.0000 meq | INTRAVENOUS | Status: AC
  Administered 2021-10-04 (×3): 10 meq via INTRAVENOUS
  Filled 2021-10-04: qty 100

## 2021-10-04 NOTE — H&P (Signed)
TRH H&P    Patient Demographics:    Nicole [name], is a 75 y.o. female  MRN: [id number]  DOB - 05-30-1946  Admit Date - 10/03/2021  Referring MD/NP/PA: Ronelle Nigh  Outpatient Primary MD for the patient is Jake Samples, PA-C  Patient coming from: [facility]  Chief complaint- abdominal pain   HPI:    Nicole [name]  is a 75 y.o. female, with history of CHF, collagen, GERD, HTN, CHF, collagen vascular disease, and more presents to the ED with a chief complaint of abdominal pain.  At the time of my exam patient would not wake up.  She would open her eyes and look at me and tell me to go away and go back to sleep.  The only question she answered was when I asked her to say her full name.  Apparently she had told the nurse earlier that she was extremely tired and wanted to just go to sleep.  She was given trazodone and melatonin at that time, and she just could not or would not stay awake to discuss her history with me.  Apparently-Per chart review-patient came in with abdominal pain.  She had been admitted in November when she had a psoas muscle abscess.  She was treated with doxycycline.  She ended up being readmitted in December and discharged on December 8 for intractable nausea and vomiting with AKI.  CT scan revealed improving psoas muscle abscess at that time.  She reported to the ED provider that she was feeling better and then her symptoms became acutely worse 2 days ago.  She has had episodes of nausea and vomiting and diarrhea.  She has tried antiemetic medication without relief.  No further history was reported to the ED provider and patient was not providing history to me.  Per chart review and patient was discharged on 8 December, she was advised to follow-up with her PCP in 1 week get a BMP and a CBC and follow-up with GI to consider upper endoscopy.  Her nausea and vomiting was managed with Zofran and IV  fluids.  Her AKI improved with IV fluids.  She did have hypokalemia at that time that was secondary to GI losses.  She was discharged with potassium supplementation.  In the ED today Patient is afebrile, slightly tachycardic, slightly tachypneic, blood pressure stable, maintaining oxygen sats on room air No leukocytosis with white blood cell count 10.2, hemoglobin 14.5 Slightly hypokalemic at 3.3 Initial lactic acid 1.7, repeat 1.6 CT of the abdomen and pelvis showed no acute findings.  Gas and asymmetric soft tissue adjacent to the L4-L5 disc space is similar in appearance when compared with the December 3 image. Admission was requested for abdominal pain nausea and vomiting   Review of systems:     unfortunately review of systems cannot be obtained secondary to patient not participating in medical interview.    Past History of the following :    Past Medical History:  Diagnosis Date   AC (acromioclavicular) joint bone spurs    lt shoulder  Anemia    Arthritis    Carpal tunnel syndrome, bilateral    CHF (congestive heart failure) (HCC)    Collagen vascular disease (HCC)    Gastroesophageal reflux    Headache    recent visit to ER @ [facility] for severe headache   Hypertension    Lumbar stenosis    Hx of ESIs by Dr. Nelva Bush   Polyarthralgia    Polymyalgia [facility])    Shingles    Spinal stenosis       Past Surgical History:  Procedure Laterality Date   BACK SURGERY  07/05/2018, 07/2018   x2    BIOPSY  09/22/2020   Procedure: BIOPSY;  Surgeon: Rogene Houston, MD;  [facility];  Service: Endoscopy;;   CHOLECYSTECTOMY     COLONOSCOPY  06/11/2012   Procedure: COLONOSCOPY;  Surgeon: Jamesetta So, MD;  Location: [facility];  Service: Gastroenterology;  Laterality: N/A;   COLONOSCOPY WITH PROPOFOL N/A 09/22/2020   Procedure: COLONOSCOPY WITH PROPOFOL;  Surgeon: Rogene Houston, MD;  Location[facility];  Service: Endoscopy;  Laterality: N/A;  730    HYSTEROSCOPY WITH D & C N/A 02/25/2014   Procedure: DILATATION AND CURETTAGE /HYSTEROSCOPY;  Surgeon: Florian Buff, MD;  Location: [facility] OR[facility];  Service: Gynecology;  Laterality: N/A;   POLYPECTOMY N/A 02/25/2014   Procedure: POLYPECTOMY;  Surgeon: Florian Buff, MD;  Location: [facility];  Service: Gynecology;  Laterality: N/A;   RESECTION DISTAL CLAVICAL Right 03/26/2015   Procedure: OPEN DISTAL CLAVICAL RESECTION ;  Surgeon: Netta Cedars, MD;  Location: [facility];  Service: Orthopedics;  Laterality: Right;      Social History:      Social History   Tobacco Use   Smoking status: Never   Smokeless tobacco: Never  Substance Use Topics   Alcohol use: No       Family History :     Family History  Problem Relation Age of Onset   Hypertension Mother    Pneumonia Father    Arthritis Sister    Diabetes Paternal Grandmother       Home Medications:   Prior to Admission medications   Medication Sig Start Date End Date Taking? Authorizing Provider  acetaminophen (TYLENOL) 325 MG tablet Take 2 tablets (650 mg total) by mouth every 6 (six) hours as needed for mild pain or headache (or Fever >/= 101). 05/06/18  Yes Barton Dubois, MD  ALPRAZolam Duanne Moron) 0.25 MG tablet Take 1 tablet (0.25 mg total) by mouth daily as needed. Patient taking differently: Take 0.25 mg by mouth at bedtime. 09/29/21  Yes Mariel Aloe, MD  atorvastatin (LIPITOR) 20 MG tablet Take 20 mg by mouth daily. 01/13/21  Yes [provider]  calcitRIOL (ROCALTROL) 0.25 MCG capsule Take 0.25 mcg by mouth See admin instructions. Take 0.25 every Mon, Wed, Fri. 09/06/20  Yes [provider]  cyclobenzaprine (FLEXERIL) 5 MG tablet Take 5 mg by mouth every 12 (twelve) hours as needed for muscle spasms.  02/20/20  Yes [provider]  diclofenac Sodium (VOLTAREN) 1 % GEL Apply 2 g topically 4 (four) times daily as needed (pain).    Yes [provider]  lactose free nutrition (BOOST) LIQD Take 237 mLs by  mouth 3 (three) times daily between meals.   Yes [provider]  leflunomide (ARAVA) 10 MG tablet TAKE (1) TABLET BY MOUTH DAILY. Patient taking differently: Take 10 mg by mouth daily. 12/27/20  Yes Ofilia Neas,  PA-C  lidocaine (LIDODERM) 5 % Place 1-2 patches onto the skin every 12 (twelve) hours as needed (pain). 02/19/20  Yes [provider]  melatonin 3 MG TABS tablet Take 3 mg by mouth at bedtime.   Yes [provider]  metolazone (ZAROXOLYN) 5 MG tablet Take 5 mg by mouth 2 (two) times a week. Tuesday and Fridays   Yes [provider]  metoprolol succinate (TOPROL XL) 25 MG 24 hr tablet Take 1 tablet (25 mg total) by mouth daily. 07/21/21 07/21/22 Yes Emokpae, Courage, MD  Multiple Vitamins-Minerals (MULTIVITAMIN PO) Take 1 tablet by mouth daily.    Yes [provider]  NUCYNTA ER 200 MG TB12 Take 100 mg by mouth every 12 (twelve) hours. 09/14/21  Yes [provider]  ondansetron (ZOFRAN) 4 MG tablet Take 4 mg by mouth every 8 (eight) hours as needed for nausea or vomiting. 09/22/21  Yes [provider]  pantoprazole (PROTONIX) 40 MG tablet Take 1 tablet (40 mg total) by mouth daily. 09/19/21  Yes Johnson, Clanford L, MD  potassium chloride (KLOR-CON) 10 MEQ tablet Take 1 tablet (10 mEq total) by mouth 2 (two) times daily. While taking torsemide 09/28/21 10/28/21 Yes Mariel Aloe, MD  predniSONE (DELTASONE) 1 MG tablet Take 9 tablets (9 mg total) by mouth daily with breakfast. 07/22/21  Yes Emokpae, Courage, MD  Simethicone (GAS-X PO) Take 1 tablet by mouth 3 (three) times daily as needed (flatulence).    Yes [provider]  Torsemide 40 MG TABS Take 40 mg by mouth 2 (two) times daily. 09/21/21  Yes Johnson, Clanford L, MD  traZODone (DESYREL) 50 MG tablet Take 25 mg by mouth at bedtime. 09/22/21  Yes [provider]  trolamine salicylate (ASPERCREME) 10 % cream Apply 1 application topically as needed for muscle pain.    Yes [provider]     Allergies:     Allergies  Allergen Reactions   Oxycontin [Oxycodone Hcl] Swelling    Pt tolerates hydromorphone.   Sulfa Antibiotics Other (See Comments)    Sores   Tramadol     "Makes me feel like I am falling"   Penicillins Rash   Sulfasalazine Other (See Comments)    Sores     Physical Exam:   Vitals  Blood pressure 105/83, pulse (!) 104, temperature 98.3 F (36.8 C), resp. rate 18, height 5\' 1"  (1.549 m), weight 101.9 kg, SpO2 94 %.   1.  General: Patient lying supine in bed,  no acute distress   2. Psychiatric: Somnolent, asking me to go away, not responding to questions  3. Neurologic:  face is symmetric, withdraws from pain in all 4 extremities, at baseline without acute deficits on limited exam   4. HEENMT:  Head is atraumatic, normocephalic, pupils reactive to light, neck is supple, trachea is midline, mucous membranes are moist   5. Respiratory : Lungs are clear to auscultation bilaterally without wheezing, rhonchi, rales, no cyanosis, no increase in work of breathing or accessory muscle use   6. Cardiovascular : Heart rate normal, rhythm is regular, no murmurs, rubs or gallops, no peripheral edema, peripheral pulses palpated   7. Gastrointestinal:  Abdomen is soft, nondistended, nontender to palpation during my exam when she is quite drowsy, bowel sounds active, no masses or organomegaly palpated   8. Skin:  Skin is warm, dry and intact without rashes, acute lesions, or ulcers on limited exam   9.Musculoskeletal:  No acute deformities or trauma, no asymmetry  in tone, no peripheral edema, peripheral pulses palpated, no tenderness to palpation in the extremities     Data Review:    CBC Recent Labs  Lab 09/27/21 0636 09/28/21 0456 10/03/21 1657  WBC 6.5 5.7 10.2  HGB 10.8* 10.8* 14.5  HCT 32.9* 33.1* 43.9  PLT 259 312 343  MCV 98.2 97.6 96.1  MCH 32.2 31.9 31.7  MCHC 32.8 32.6 33.0  RDW 14.5 14.5 15.1    ------------------------------------------------------------------------------------------------------------------  Results for orders placed or performed during the hospital encounter of 10/03/21 (from the past 48 hour(s))  Lipase, blood     Status: None   Collection Time: 10/03/21  4:57 PM  Result Value Ref Range   Lipase 37 11 - 51 U/L    Comment: Performed at [facility], [address] [place], [place] [address]  Comprehensive metabolic panel     Status: Abnormal   Collection Time: 10/03/21  4:57 PM  Result Value Ref Range   Sodium 141 135 - 145 mmol/L   Potassium 3.3 (L) 3.5 - 5.1 mmol/L   Chloride 99 98 - 111 mmol/L   CO2 29 22 - 32 mmol/L   Glucose, Bld 106 (H) 70 - 99 mg/dL    Comment: Glucose reference range applies only to samples taken after fasting for at least 8 hours.   BUN 27 (H) 8 - 23 mg/dL   Creatinine, Ser 2.23 (H) 0.44 - 1.00 mg/dL   Calcium 9.1 8.9 - 10.3 mg/dL   Total Protein 7.1 6.5 - 8.1 g/dL   Albumin 3.4 (L) 3.5 - 5.0 g/dL   AST 30 15 - 41 U/L   ALT 25 0 - 44 U/L   Alkaline Phosphatase 98 38 - 126 U/L   Total Bilirubin 1.0 0.3 - 1.2 mg/dL   GFR, Estimated 22 (L) >60 mL/min    Comment: (NOTE) Calculated using the CKD-EPI Creatinine Equation (2021)    Anion gap 13 5 - 15    Comment: Performed at [facility], [address] [place], [place] [address]  CBC     Status: None   Collection Time: 10/03/21  4:57 PM  Result Value Ref Range   WBC 10.2 4.0 - 10.5 K/uL   RBC 4.57 3.87 - 5.11 MIL/uL   Hemoglobin 14.5 12.0 - 15.0 g/dL   HCT 43.9 36.0 - 46.0 %   MCV 96.1 80.0 - 100.0 fL   MCH 31.7 26.0 - 34.0 pg   MCHC 33.0 30.0 - 36.0 g/dL   RDW 15.1 11.5 - 15.5 %   Platelets 343 150 - 400 K/uL   nRBC 0.0 0.0 - 0.2 %    Comment: Performed at [facility], [address], [place], [place] [address]  Lactic acid, plasma     Status: None   Collection Time: 10/03/21  6:28 PM  Result Value Ref Range   Lactic Acid, Venous 1.7 0.5 - 1.9 mmol/L    Comment:  Performed at [facility], [address], [place], [place] [address]  Resp Panel by RT-PCR (Flu A&B, Covid) Nasopharyngeal Swab     Status: None   Collection Time: 10/03/21  6:50 PM   Specimen: Nasopharyngeal Swab; Nasopharyngeal(NP) swabs in vial transport medium  Result Value Ref Range   SARS Coronavirus 2 by RT PCR NEGATIVE NEGATIVE    Comment: (NOTE) SARS-CoV-2 target nucleic acids are NOT DETECTED.  The SARS-CoV-2 RNA is generally detectable in upper respiratory specimens during the acute phase of infection. The lowest concentration of SARS-CoV-2 viral  copies this assay can detect is 138 copies/mL. A negative result does not preclude SARS-Cov-2 infection and should not be used as the sole basis for treatment or other patient management decisions. A negative result may occur with  improper specimen collection/handling, submission of specimen other than nasopharyngeal swab, presence of viral mutation(s) within the areas targeted by this assay, and inadequate number of viral copies(<138 copies/mL). A negative result must be combined with clinical observations, patient history, and epidemiological information. The expected result is Negative.  Fact Sheet for Patients:  EntrepreneurPulse.com.au  Fact Sheet for Healthcare Providers:  IncredibleEmployment.be  This test is no t yet approved or cleared by the [place] FDA and  has been authorized for detection and/or diagnosis of SARS-CoV-2 by FDA under an Emergency Use Authorization (EUA). This EUA will remain  in effect (meaning this test can be used) for the duration of the COVID-19 declaration under Section 564(b)(1) of the Act, 21 U.S.C.section 360bbb-3(b)(1), unless the authorization is terminated  or revoked sooner.       Influenza A by PCR NEGATIVE NEGATIVE   Influenza B by PCR NEGATIVE NEGATIVE    Comment: (NOTE) The Xpert Xpress SARS-CoV-2/FLU/RSV plus assay is intended as an  aid in the diagnosis of influenza from Nasopharyngeal swab specimens and should not be used as a sole basis for treatment. Nasal washings and aspirates are unacceptable for Xpert Xpress SARS-CoV-2/FLU/RSV testing.  Fact Sheet for Patients: EntrepreneurPulse.com.au  Fact Sheet for Healthcare Providers: IncredibleEmployment.be  This test is not yet approved or cleared by the [place] FDA and has been authorized for detection and/or diagnosis of SARS-CoV-2 by FDA under an Emergency Use Authorization (EUA). This EUA will remain in effect (meaning this test can be used) for the duration of the COVID-19 declaration under Section 564(b)(1) of the Act, 21 U.S.C. section 360bbb-3(b)(1), unless the authorization is terminated or revoked.  Performed at [facility], [address], [place], [place] [address]   Lactic acid, plasma     Status: None   Collection Time: 10/03/21  8:59 PM  Result Value Ref Range   Lactic Acid, Venous 1.6 0.5 - 1.9 mmol/L    Comment: Performed at [facility], [address], [place], [place] [address]    Chemistries  Recent Labs  Lab 09/27/21 0636 09/28/21 0456 10/03/21 1657  NA 139 139 141  K 2.8* 3.4* 3.3*  CL 109 110 99  CO2 21* 21* 29  GLUCOSE 82 78 106*  BUN 24* 15 27*  CREATININE 1.12* 0.94 2.23*  CALCIUM 7.9* 8.4* 9.1  MG 2.2 2.1  --   AST  --   --  30  ALT  --   --  25  ALKPHOS  --   --  98  BILITOT  --   --  1.0   ------------------------------------------------------------------------------------------------------------------  ------------------------------------------------------------------------------------------------------------------ GFR: Estimated Creatinine Clearance: 23.9 mL/min (A) (by C-G formula based on SCr of 2.23 mg/dL (H)). Liver Function Tests: Recent Labs  Lab 10/03/21 1657  AST 30  ALT 25  ALKPHOS 98  BILITOT 1.0  PROT 7.1  ALBUMIN 3.4*   Recent Labs  Lab  10/03/21 1657  LIPASE 37   No results for input(s): AMMONIA in the last 168 hours. Coagulation Profile: No results for input(s): INR, PROTIME in the last 168 hours. Cardiac Enzymes: No results for input(s): CKTOTAL, CKMB, CKMBINDEX, TROPONINI in the last 168 hours. BNP (last 3 results) No results for input(s): PROBNP in the last 8760 hours. HbA1C: No results for  input(s): HGBA1C in the last 72 hours. CBG: No results for input(s): GLUCAP in the last 168 hours. Lipid Profile: No results for input(s): CHOL, HDL, LDLCALC, TRIG, CHOLHDL, LDLDIRECT in the last 72 hours. Thyroid Function Tests: No results for input(s): TSH, T4TOTAL, FREET4, T3FREE, THYROIDAB in the last 72 hours. Anemia Panel: No results for input(s): VITAMINB12, FOLATE, FERRITIN, TIBC, IRON, RETICCTPCT in the last 72 hours.  --------------------------------------------------------------------------------------------------------------- Urine analysis:    Component Value Date/Time   COLORURINE YELLOW 09/24/2021 1646   APPEARANCEUR CLOUDY (A) 09/24/2021 1646   LABSPEC 1.015 09/24/2021 1646   PHURINE 5.5 09/24/2021 1646   GLUCOSEU NEGATIVE 09/24/2021 1646   HGBUR NEGATIVE 09/24/2021 1646   BILIRUBINUR NEGATIVE 09/24/2021 1646   BILIRUBINUR negative 02/14/2020 1328   KETONESUR NEGATIVE 09/24/2021 1646   PROTEINUR NEGATIVE 09/24/2021 1646   UROBILINOGEN 0.2 02/14/2020 1328   UROBILINOGEN 0.2 02/20/2014 1116   NITRITE NEGATIVE 09/24/2021 1646   LEUKOCYTESUR NEGATIVE 09/24/2021 1646      Imaging Results:    CT ABDOMEN PELVIS WO CONTRAST  Result Date: 10/03/2021 CLINICAL DATA:  Left lower quadrant abdominal pain EXAM: CT ABDOMEN AND PELVIS WITHOUT CONTRAST TECHNIQUE: Multidetector CT imaging of the abdomen and pelvis was performed following the standard protocol without IV contrast. COMPARISON:  CT abdomen and pelvis dated September 24, 2021 FINDINGS: Lower chest: Bibasilar atelectasis.  Coronary artery calcifications.  Hepatobiliary: Mildly nodular liver contour. No focal liver abnormality is seen. Status post cholecystectomy. No biliary dilatation. Pancreas: Unremarkable. No pancreatic ductal dilatation or surrounding inflammatory changes. Spleen: Normal in size without focal abnormality. Adrenals/Urinary Tract: Adrenal glands are unremarkable. Kidneys are normal, without renal calculi, focal lesion, or hydronephrosis. Bladder is unremarkable. Stomach/Bowel: Stomach is within normal limits. Appendix appears normal. Diverticulosis. No evidence of bowel wall thickening, distention, or inflammatory changes. Vascular/Lymphatic: Aortic atherosclerosis. No enlarged abdominal or pelvic lymph nodes. Reproductive: Stable partially calcified left pelvic mass. Other: Small fat containing umbilical hernia. No abdominopelvic ascites. Musculoskeletal: Gas and asymmetric soft tissue adjacent to the left L4-L5 disc space is similar in appearance when compared with December 3rd 2022 prior. IMPRESSION: 1. No acute findings in the abdomen or pelvis. 2. Gas and asymmetric soft tissue adjacent to the left L4-L5 disc space is similar in appearance when compared with December 3rd 2022 prior. 3. Stable calcified left pelvic mass. 4. Coronary artery calcifications and aortic Atherosclerosis (ICD10-I70.0). Electronically Signed   By: Yetta Glassman M.D.   On: 10/03/2021 20:36   DG CHEST PORT 1 VIEW  Result Date: 10/03/2021 CLINICAL DATA:  Abdominal pain, nausea, vomiting and diarrhea. EXAM: PORTABLE CHEST 1 VIEW COMPARISON:  Portable chest 07/20/2021 FINDINGS: The heart enlarged but unchanged. No vascular congestion is seen. The aorta is tortuous patchy calcification and mild ectasia. Linear scarring or atelectasis again noted left mid field. The lungs are otherwise clear. No pleural effusion is seen. Advanced bilateral shoulder DJD. IMPRESSION: No evidence of acute chest disease or interval changes. Cardiomegaly. Stable chest. Electronically  Signed   By: Telford Nab M.D.   On: 10/03/2021 20:32      Assessment & Plan:    Principal Problem:   AKI (acute kidney injury) ([place]) Active Problems:   Hypokalemia   Mixed hyperlipidemia   Intractable nausea and vomiting   AKI Creatinine increased from 0.94-2.23 1 L bolus given in the ED Likely secondary to poor p.o. intake in the setting of intractable nausea and vomiting No further fluids given at this time secondary to history of CHF Trend in the  a.m. Abdominal pain CT abdomen pelvis shows no acute findings Patient had psoas muscle abscess that had been treated with Doxy and is no longer a finding on imaging Pain control N.p.o. Continue to monitor Hypokalemia Replace and recheck Hyperlipidemia Continue statin Protein calorie malnutrition Albumin 3.4 When patient is able to tolerate p.o. restart boost shakes    DVT Prophylaxis-   Heparin - SCDs   AM Labs Ordered, also please review Full Orders  Family Communication: No family at bedside  Code Status: Full  Admission status: Inpatient :The appropriate admission status for this patient is INPATIENT. Inpatient status is judged to be reasonable and necessary in order to provide the required intensity of service to ensure the patient's safety. The patient's presenting symptoms, physical exam findings, and initial radiographic and laboratory data in the context of their chronic comorbidities is felt to place them at high risk for further clinical deterioration. Furthermore, it is not anticipated that the patient will be medically stable for discharge from the hospital within 2 midnights of admission. The following factors support the admission status of inpatient.     The patient's presenting symptoms include abdominal pain. The worrisome physical exam findings include tachycardic at presentation. The initial radiographic and laboratory data are worrisome because of AKI. The chronic co-morbidities include  hyperlipidemia, hypertension, protein calorie malnutrition.       * I certify that at the point of admission it is my clinical judgment that the patient will require inpatient hospital care spanning beyond 2 midnights from the point of admission due to high intensity of service, high risk for further deterioration and high frequency of surveillance required.*  Disposition: Anticipated Discharge date 48h Discharge to SNF  Time spent in minutes : [place]

## 2021-10-04 NOTE — TOC Initial Note (Signed)
Transition of Care [facility]) - Initial/Assessment Note    Patient Details  Name: Nicole [name] MRN: [id number] Date of Birth: 02-Jan-1946  Transition of Care [facility]) CM/SW Contact:    Salome Arnt, [place] Phone Number: 10/04/2021, 8:41 AM  Clinical Narrative: Pt admitted due to acute kidney injury and abdominal pain. Pt known to TOC from recent admissions. She is a resident at [facility]. LCSW spoke with pt who confirms plan to return when medically stable. Per Chasity at [facility], pt is active with in house PT. She is not aware of any reason pt cannot return at d/c, but said pt will likely need to be assessed prior to return. TOC will continue to follow.                   Expected Discharge Plan: Assisted Living Barriers to Discharge: Continued Medical Work up   Patient Goals and CMS Choice Patient states their goals for this hospitalization and ongoing recovery are:: Return to ALF   Choice offered to / list presented to : Patient  Expected Discharge Plan and Services Expected Discharge Plan: Assisted Living In-house Referral: Clinical Social Work   Post Acute Care Choice: Resumption of Svcs/PTA Provider Living arrangements for the past 2 months: Assisted Living Facility                 DME Arranged: N/A DME Agency: NA       HH Arranged: PT [place] Agency: Other - See comment (in house PT) Date [place]: 10/04/21 Time HH Agency Contacted: 0840 Representative spoke with at [place]: In house PT at [place] Arrangements/Services Living arrangements for the past 2 months: [place] Lives with:: Facility Resident Patient language and need for interpreter reviewed:: Yes Do you feel safe going back to the place where you live?: Yes      Need for Family Participation in Patient Care: No (Comment) Care giver support system in place?: Yes (comment) Current home services: DME (wheelchair, 3N1) Criminal Activity/Legal  Involvement Pertinent to Current Situation/Hospitalization: No - Comment as needed  Activities of Daily Living Home Assistive Devices/Equipment: Grab bars around toilet ADL Screening (condition at time of admission) Patient's cognitive ability adequate to safely complete daily activities?: Yes Is the patient deaf or have difficulty hearing?: No Does the patient have difficulty seeing, even when wearing glasses/contacts?: No Does the patient have difficulty concentrating, remembering, or making decisions?: No Patient able to express need for assistance with ADLs?: Yes Does the patient have difficulty dressing or bathing?: Yes Independently performs ADLs?: No Communication: Independent Is this a change from baseline?: Pre-admission baseline Dressing (OT): Needs assistance Is this a change from baseline?: Pre-admission baseline Grooming: Needs assistance Is this a change from baseline?: Pre-admission baseline Feeding: Independent with device (comment) Bathing: Needs assistance Is this a change from baseline?: Pre-admission baseline Toileting: Needs assistance Is this a change from baseline?: Pre-admission baseline In/Out Bed: Dependent Is this a change from baseline?: Pre-admission baseline Walks in Home: Needs assistance Is this a change from baseline?: Pre-admission baseline Does the patient have difficulty walking or climbing stairs?: Yes Weakness of Legs: Both Weakness of Arms/Hands: None  Permission Sought/Granted   Permission granted to share information with : Yes, Verbal Permission Granted     Permission granted to share info w AGENCY: [facility]  Permission granted to share info w Relationship: ALF     Emotional Assessment   Attitude/Demeanor/Rapport: Engaged Affect (typically observed): Appropriate  Orientation: : Oriented to Self, Oriented to Place, Oriented to  Time, Oriented to Situation Alcohol / Substance Use: Not Applicable Psych Involvement: No  (comment)  Admission diagnosis:  AKI (acute kidney injury) ([place]) [N17.9] Acute kidney injury ([place]) [N17.9] Intractable nausea and vomiting [R11.2] Fluid overload [E87.70] Patient Active Problem List   Diagnosis Date Noted   Intractable nausea and vomiting 09/24/2021   Myositis 09/16/2021   Psoas abscess, left (2.0 cm on MRI)  09/16/2021   AKI (acute kidney injury) ([place]) 56/31/4970   Acute metabolic encephalopathy 26/37/8588   Hypokalemia 07/20/2021   Elevated MCV 07/20/2021   Mixed hyperlipidemia 07/20/2021   Diverticulitis 05/02/2018   Diverticulitis of colon 05/01/2018   Morbid obesity with BMI of 50.0-59.9, adult ([place]) 05/01/2018   Chronic diastolic HF (heart failure) ([place]) 05/01/2018   On prednisone therapy 05/31/2017   Positive anti-CCP test 05/31/2017   Rheumatoid factor positive 05/31/2017   Memory loss 04/04/2017   Polymyalgia rheumatica ([place]) 04/04/2017   High risk medication use 12/07/2016   DDD (degenerative disc disease), lumbar 09/27/2016   HTN (hypertension) 06/05/2016   Chronic pain syndrome 06/05/2016   Acute diverticulitis 06/02/2016   Diverticulitis large intestine 06/02/2016   Spinal stenosis of lumbosacral region 02/29/2016   Joint pain 02/29/2016   Endometrial polyp 02/06/2014   Postmenopausal bleeding 01/30/2014   GERD (gastroesophageal reflux disease) 09/29/2013   Spinal stenosis, thoracic 09/29/2013   Shingles 09/29/2013   Thoracic or lumbosacral neuritis or radiculitis, unspecified 05/04/2011   Abnormality of gait 05/04/2011   Muscle weakness (generalized) 05/04/2011   Bilateral primary osteoarthritis of knee 04/07/2009   KNEE PAIN 04/07/2009   PCP:  Jake Samples, PA-C Pharmacy:   [facility] [place] [address] Phone: [phone] Fax: [phone]     Social Determinants of Health (SDOH) Interventions    Readmission Risk Interventions Readmission Risk Prevention Plan  10/04/2021 09/26/2021  Transportation Screening Complete Complete  HRI or Home Care Consult Complete Complete  Social Work Consult for [place] Planning/Counseling Complete Complete  Palliative Care Screening Not Applicable Not Applicable  Medication Review Press photographer) Complete Complete  Some recent data might be hidden

## 2021-10-04 NOTE — Consult Note (Signed)
@LOGO @   Referring Provider: [facility] Primary Care Physician:  Scherrie Bateman Primary Gastroenterologist:  Dr. Laural Golden  Date of Admission: 10/03/21 Date of Consultation: 10/04/21  Reason for Consultation: Recurrent nausea and vomiting  HPI:  Nicole [name] is a 75 y.o. year old female   Admitted in November with nausea, abdominal pain, back pain, found to have left psoas muscle abscess at level of L5-L4, treated with IV vancomycin and Rocephin, discharged on doxycycline.  She was discharged to [facility], but continued with nausea, vomiting, difficulty swallowing her food.  PCP gave her antiemetics and an acid reducer which were not helpful.  She was readmitted in early December with intractable nausea/vomiting.  She was managed supportively with Zofran and IV fluids, symptoms resolved.  Recommended outpatient GI follow-up for consideration of upper endoscopy.  She did have repeat CT scan that showed improvement in left psoas abscess.  Nodular liver.  No prior EGD  ED course: Afebrile, slightly tachycardic, slightly tachypneic, blood pressure stable, maintaining oxygen sats on room air No leukocytosis with white blood cell count 10.2, hemoglobin 14.5 Lipase within normal limits. Slightly hypokalemic at 3.3, AKI with creatinine 2.23, LFTs within normal limits. Initial lactic acid 1.7, repeat 1.6 Respiratory panel negative. CT of the abdomen and pelvis showed no acute findings.  Gas and asymmetric soft tissue adjacent to the L4-L5 disc space is similar in appearance when compared with the December 3 image. Admission was requested for abdominal pain nausea and vomiting.  GI consulted for further evaluation.  Today: N/V started more than 3 weeks ago. Started off with inability to eat due decreased appetite and having to chew and chew to swallow her foods. Occasional pill dysphagia. No food dysphagia. Occasionally feels a limp in her throat. Then symptoms progressed to  not being able to force herself to eat. Smelling food or any smells would make her nauseated and gagging and occasional vomiting. Was given antiemetic which helped, but still had gagging. Vomit is like foamy watery. No hematemesis. Associated stabbing abdominal pain. Had some mild epigastric pain, but now pain is more periumbilical and to the left of her belly button. Some burning as well.   No N/V today as she has been NPO.   No heartburn.   Recent change in bowel habits. Stools are more yellow, watery, and with a lot of gas. Started a few days ago. 4-5 Bms per day and some nocturnal stools.  Reports she has a couple spots of blood in her stool with rectal burning. Has history of this about 4 years or so ago. Reports history of hemorrhoids. No melena.   At rehabilitation.   No prior EGD.         Past Medical History:  Diagnosis Date   AC (acromioclavicular) joint bone spurs    lt shoulder   Anemia    Arthritis    Carpal tunnel syndrome, bilateral    CHF (congestive heart failure) (HCC)    Collagen vascular disease (HCC)    Gastroesophageal reflux    Headache    recent visit to ER @ [facility] for severe headache   Hypertension    Lumbar stenosis    Hx of ESIs by Dr. Nelva Bush   Polyarthralgia    Polymyalgia [facility])    Shingles    Spinal stenosis     Past Surgical History:  Procedure Laterality Date   BACK SURGERY  07/05/2018, 07/2018   x2    BIOPSY  09/22/2020   Procedure: BIOPSY;  Surgeon: Rogene Houston, MD;  Location[facility];  Service: Endoscopy;;   CHOLECYSTECTOMY     COLONOSCOPY  06/11/2012   Procedure: COLONOSCOPY;  Surgeon: Jamesetta So, MD;  Location: [facility];  Service: Gastroenterology;  Laterality: N/A;   COLONOSCOPY WITH PROPOFOL N/A 09/22/2020   Procedure: COLONOSCOPY WITH PROPOFOL;  Surgeon: Rogene Houston, MD;  Location: [facility];  Service: Endoscopy;  Laterality: N/A;  730   HYSTEROSCOPY WITH D & C N/A 02/25/2014   Procedure: DILATATION  AND CURETTAGE /HYSTEROSCOPY;  Surgeon: Florian Buff, MD;  Location: [facility] ORS;  Service: Gynecology;  Laterality: N/A;   POLYPECTOMY N/A 02/25/2014   Procedure: POLYPECTOMY;  Surgeon: Florian Buff, MD;  Location: [facility] OR[facility];  Service: Gynecology;  Laterality: N/A;   RESECTION DISTAL CLAVICAL Right 03/26/2015   Procedure: OPEN DISTAL CLAVICAL RESECTION ;  Surgeon: Netta Cedars, MD;  Location: [facility];  Service: Orthopedics;  Laterality: Right;    Prior to Admission medications   Medication Sig Start Date End Date Taking? Authorizing Provider  acetaminophen (TYLENOL) 325 MG tablet Take 2 tablets (650 mg total) by mouth every 6 (six) hours as needed for mild pain or headache (or Fever >/= 101). 05/06/18  Yes Barton Dubois, MD  ALPRAZolam Duanne Moron) 0.25 MG tablet Take 1 tablet (0.25 mg total) by mouth daily as needed. Patient taking differently: Take 0.25 mg by mouth at bedtime. 09/29/21  Yes Mariel Aloe, MD  atorvastatin (LIPITOR) 20 MG tablet Take 20 mg by mouth daily. 01/13/21  Yes [provider]  calcitRIOL (ROCALTROL) 0.25 MCG capsule Take 0.25 mcg by mouth See admin instructions. Take 0.25 every Mon, Wed, Fri. 09/06/20  Yes [provider]  cyclobenzaprine (FLEXERIL) 5 MG tablet Take 5 mg by mouth every 12 (twelve) hours as needed for muscle spasms.  02/20/20  Yes [provider]  diclofenac Sodium (VOLTAREN) 1 % GEL Apply 2 g topically 4 (four) times daily as needed (pain).    Yes [provider]  lactose free nutrition (BOOST) LIQD Take 237 mLs by mouth 3 (three) times daily between meals.   Yes [provider]  leflunomide (ARAVA) 10 MG tablet TAKE (1) TABLET BY MOUTH DAILY. Patient taking differently: Take 10 mg by mouth daily. 12/27/20  Yes Ofilia Neas, PA-C  lidocaine (LIDODERM) 5 % Place 1-2 patches onto the skin every 12 (twelve) hours as needed (pain). 02/19/20  Yes [provider]  melatonin 3 MG TABS tablet Take 3 mg by mouth at bedtime.    Yes [provider]  metolazone (ZAROXOLYN) 5 MG tablet Take 5 mg by mouth 2 (two) times a week. Tuesday and Fridays   Yes [provider]  metoprolol succinate (TOPROL XL) 25 MG 24 hr tablet Take 1 tablet (25 mg total) by mouth daily. 07/21/21 07/21/22 Yes Emokpae, Courage, MD  Multiple Vitamins-Minerals (MULTIVITAMIN PO) Take 1 tablet by mouth daily.    Yes [provider]  NUCYNTA ER 200 MG TB12 Take 100 mg by mouth every 12 (twelve) hours. 09/14/21  Yes [provider]  ondansetron (ZOFRAN) 4 MG tablet Take 4 mg by mouth every 8 (eight) hours as needed for nausea or vomiting. 09/22/21  Yes [provider]  pantoprazole (PROTONIX) 40 MG tablet Take 1 tablet (40 mg total) by mouth daily. 09/19/21  Yes Johnson, Clanford L, MD  potassium chloride (KLOR-CON) 10 MEQ tablet Take 1 tablet (10 mEq total) by mouth 2 (two) times daily. While  taking torsemide 09/28/21 10/28/21 Yes Mariel Aloe, MD  predniSONE (DELTASONE) 1 MG tablet Take 9 tablets (9 mg total) by mouth daily with breakfast. 07/22/21  Yes Emokpae, Courage, MD  Simethicone (GAS-X PO) Take 1 tablet by mouth 3 (three) times daily as needed (flatulence).    Yes [provider]  Torsemide 40 MG TABS Take 40 mg by mouth 2 (two) times daily. 09/21/21  Yes Johnson, Clanford L, MD  traZODone (DESYREL) 50 MG tablet Take 25 mg by mouth at bedtime. 09/22/21  Yes [provider]  trolamine salicylate (ASPERCREME) 10 % cream Apply 1 application topically as needed for muscle pain.   Yes [provider]    Current Facility-Administered Medications  Medication Dose Route Frequency Provider Last Rate Last Admin   acetaminophen (TYLENOL) tablet 650 mg  650 mg Oral Q6H PRN Zierle-Ghosh, Asia B, DO       Or   acetaminophen (TYLENOL) suppository 650 mg  650 mg Rectal Q6H PRN Zierle-Ghosh, Asia B, DO       ALPRAZolam (XANAX) tablet 0.25 mg  0.25 mg Oral BID PRN Zierle-Ghosh, Asia B, DO        atorvastatin (LIPITOR) tablet 20 mg  20 mg Oral Daily Zierle-Ghosh, Asia B, DO       [START ON 10/05/2021] calcitRIOL (ROCALTROL) capsule 0.25 mcg  0.25 mcg Oral Once per day on Mon Wed Fri Zierle-Ghosh, [place] B, DO       heparin injection 5,000 Units  5,000 Units Subcutaneous Q8H Zierle-Ghosh, Asia B, DO   5,000 Units at 10/04/21 9357   magnesium sulfate IVPB 2 g 50 mL  2 g Intravenous Once Mariel Aloe, MD       melatonin tablet 6 mg  6 mg Oral QHS Zierle-Ghosh, Asia B, DO   6 mg at 10/03/21 2203   metoprolol succinate (TOPROL-XL) 24 hr tablet 25 mg  25 mg Oral Daily Zierle-Ghosh, Asia B, DO       morphine 2 MG/ML injection 2 mg  2 mg Intravenous Q2H PRN Zierle-Ghosh, Asia B, DO   2 mg at 10/04/21 0177   potassium chloride 10 mEq in 100 mL IVPB  10 mEq Intravenous Q1 Hr x 4 Mariel Aloe, MD       promethazine (PHENERGAN) tablet 12.5 mg  12.5 mg Oral Q6H PRN Zierle-Ghosh, Asia B, DO   12.5 mg at 10/04/21 9390   Or   promethazine (PHENERGAN) 12.5 mg in sodium chloride 0.9 % 50 mL IVPB  12.5 mg Intravenous Q6H PRN Zierle-Ghosh, Asia B, DO       traZODone (DESYREL) tablet 25 mg  25 mg Oral QHS Zierle-Ghosh, Asia B, DO   25 mg at 10/03/21 2203    Allergies as of 10/03/2021 - Review Complete 10/03/2021  Allergen Reaction Noted   Oxycontin [oxycodone hcl] Swelling 05/09/2016   Sulfa antibiotics Other (See Comments) 05/24/2012   Tramadol  03/18/2015   Penicillins Rash 05/24/2012   Sulfasalazine Other (See Comments) 05/24/2012    Family History  Problem Relation Age of Onset   Hypertension Mother    Pneumonia Father    Arthritis Sister    Diabetes Paternal Grandmother     Social History   Socioeconomic History   Marital status: Widowed    Spouse name: Not on file   Number of children: 2   Years of education: 1 yr coll   Highest education level: Not on file  Occupational History   Occupation: Retired  Tobacco Use  Smoking status: Never   Smokeless tobacco: Never  Vaping Use    Vaping Use: Never used  Substance and Sexual Activity   Alcohol use: No   Drug use: No   Sexual activity: Not Currently    Birth control/protection: Post-menopausal  Other Topics Concern   Not on file  Social History Narrative   Lives at home alone.   Right-handed.   1-2 cups caffeine daily.   Social Determinants of Health   Financial Resource Strain: Not on file  Food Insecurity: Not on file  Transportation Needs: Not on file  Physical Activity: Not on file  Stress: Not on file  Social Connections: Not on file  Intimate Partner Violence: Not on file    Review of Systems: Gen: Denies fever, chills, cold or flu like symptoms, pre-syncope.  CV: Denies chest pain, heart palpitations Resp: Denies shortness of breath or cough.  GI: See HPI GU : Denies urinary burning, urinary frequency, urinary incontinence.  MS: Admits to chronic back pain, at baseline.  Derm: Denies rash Psych: Denies depression, anxiety Heme: See HPI  Physical Exam: Vital signs in last 24 hours: Temp:  [98 F (36.7 C)-98.8 F (37.1 C)] 98.5 F (36.9 C) (12/13 0604) Pulse Rate:  [100-114] 102 (12/13 0604) Resp:  [15-21] 18 (12/13 0604) BP: (105-131)/(66-95) 119/69 (12/13 0604) SpO2:  [92 %-99 %] 92 % (12/13 0604) Weight:  [101.9 kg] 101.9 kg (12/13 0604)   General:   Alert,  Well-developed, well-nourished, pleasant and cooperative in NAD Head:  Normocephalic and atraumatic. Eyes:  Sclera clear, no icterus.   Conjunctiva pink. Ears:  Normal auditory acuity. Lungs:  Clear throughout to auscultation.   No wheezes, crackles, or rhonchi. No acute distress. Heart:  Regular rate and rhythm; no murmurs, clicks, rubs,  or gallops. Abdomen:  Soft, and nondistended. Mild to moderate TTP in LLQ that radiates to suprapubic area with deep palpation. No masses, hepatosplenomegaly or hernias noted. Normal bowel sounds, without guarding, and without rebound.   Rectal:  Hemorrhoids present, internal exam limited  due to discomfort. No appreciable fissure. No red blood or melena.    Msk:  Symmetrical without gross deformities. Normal posture. Extremities:  Without  edema. Neurologic:  Alert and  oriented x4;  grossly normal neurologically. Skin:  Intact without significant lesions or rashes. Psych:  Normal mood and affect.  Intake/Output from previous day: No intake/output data recorded. Intake/Output this shift: No intake/output data recorded.  Lab Results: Recent Labs    10/03/21 1657 10/04/21 0503  WBC 10.2 7.4  HGB 14.5 12.0  HCT 43.9 36.9  PLT 343 303   BMET Recent Labs    10/03/21 1657 10/04/21 0503  NA 141 141  K 3.3* 3.0*  CL 99 104  CO2 29 22  GLUCOSE 106* 88  BUN 27* 27*  CREATININE 2.23* 2.28*  CALCIUM 9.1 8.3*   LFT Recent Labs    10/03/21 1657 10/04/21 0503  PROT 7.1 5.8*  ALBUMIN 3.4* 2.9*  AST 30 23  ALT 25 21  ALKPHOS 98 78  BILITOT 1.0 1.0   Studies/Results: CT ABDOMEN PELVIS WO CONTRAST  Result Date: 10/03/2021 CLINICAL DATA:  Left lower quadrant abdominal pain EXAM: CT ABDOMEN AND PELVIS WITHOUT CONTRAST TECHNIQUE: Multidetector CT imaging of the abdomen and pelvis was performed following the standard protocol without IV contrast. COMPARISON:  CT abdomen and pelvis dated September 24, 2021 FINDINGS: Lower chest: Bibasilar atelectasis.  Coronary artery calcifications. Hepatobiliary: Mildly nodular liver contour. No focal liver abnormality  is seen. Status post cholecystectomy. No biliary dilatation. Pancreas: Unremarkable. No pancreatic ductal dilatation or surrounding inflammatory changes. Spleen: Normal in size without focal abnormality. Adrenals/Urinary Tract: Adrenal glands are unremarkable. Kidneys are normal, without renal calculi, focal lesion, or hydronephrosis. Bladder is unremarkable. Stomach/Bowel: Stomach is within normal limits. Appendix appears normal. Diverticulosis. No evidence of bowel wall thickening, distention, or inflammatory changes.  Vascular/Lymphatic: Aortic atherosclerosis. No enlarged abdominal or pelvic lymph nodes. Reproductive: Stable partially calcified left pelvic mass. Other: Small fat containing umbilical hernia. No abdominopelvic ascites. Musculoskeletal: Gas and asymmetric soft tissue adjacent to the left L4-L5 disc space is similar in appearance when compared with December 3rd 2022 prior. IMPRESSION: 1. No acute findings in the abdomen or pelvis. 2. Gas and asymmetric soft tissue adjacent to the left L4-L5 disc space is similar in appearance when compared with December 3rd 2022 prior. 3. Stable calcified left pelvic mass. 4. Coronary artery calcifications and aortic Atherosclerosis (ICD10-I70.0). Electronically Signed   By: Yetta Glassman M.D.   On: 10/03/2021 20:36   DG CHEST PORT 1 VIEW  Result Date: 10/03/2021 CLINICAL DATA:  Abdominal pain, nausea, vomiting and diarrhea. EXAM: PORTABLE CHEST 1 VIEW COMPARISON:  Portable chest 07/20/2021 FINDINGS: The heart enlarged but unchanged. No vascular congestion is seen. The aorta is tortuous patchy calcification and mild ectasia. Linear scarring or atelectasis again noted left mid field. The lungs are otherwise clear. No pleural effusion is seen. Advanced bilateral shoulder DJD. IMPRESSION: No evidence of acute chest disease or interval changes. Cardiomegaly. Stable chest. Electronically Signed   By: Telford Nab M.D.   On: 10/03/2021 20:32    Impression: 75 year old female with history of polymyalgia rheumatica on chronic steroids, HTN, chronic diastolic heart failure, collagen vascular disease, who presented to the emergency room with abdominal pain, nausea, vomiting, and diarrhea.  N/V: Several week history of decreased appetite, nausea, and gagging, now with intermittent vomiting as well without hematemesis.  Symptoms are postprandial, but can also be triggered by smells, associated sharp periumbilical/LLQ abdominal pain when gagging.  Initially with mild epigastric  pain, but this has since resolved. Notably, symptoms initially started when she was found to have a left psoas muscle abscess which appears improved on her CT this admission with no acute findings. Denies history of reflux or improvement with PPI daily that was started outpatient. Denies NSAIDs, but she is chronically on prednisone. Denies food dysphagia but admits to intermittent pill dysphagia and globus sensation occasionally with liquids.   Etiology unclear. Possible gastritis, duodenitis, PUD, GOO, esophageal motility vs structural issue. Unclear if N/V is related to her LLQ pain, history of psoas muscle abscess, or now diarrhea as discussed below.   At this time, would recommend EGD with possible dilation for further evaluation and therapeutic intervention.   Dysphagia:  Intermittent pill dysphagia and occasional globus sensation to liquids.  Denies food dysphagia.  Planning on EGD this admission.  We will add possible dilation.  LLQ abdominal pain:  Present since November 2020 when she was found to have left psoas muscle abscess s/p treatment with antibiotics as it was too small to drain.  Abscess appears improved on CT this admission with gas and asymmetric soft tissue adjacent to the left L4-L5 disc base similar in appearance in comparison to CT on December 3, also with stable calcified left pelvic mass, no other acute findings. She had some improvement previously, but symptoms worsened over the last few days. She has also developed diarrhea over the last few days as  well which could also be contributing to her abdominal pain. We will check stool studies.  Rectal bleeding with rectal burning:  Low-volume intermittent hematochezia recently with associated rectal burning in the setting of known hemorrhoids and increased stool frequency.  Hemoglobin 14.5 on admission.  Colonoscopy up-to-date  in December 2021. On exam today, she has external hemorrhoids that are tender to palpation.  We will start  her on Anusol rectal cream twice daily.  Diarrhea:  4 to 5-day history of watery, yellow stools.  Reports 4-5 BMs per day and some nocturnal stools as well.  She has had 2 recent hospitalizations and was on antibiotics for left psoas muscle abscess.  Agree with stool studies which have already been ordered by hospitalist.  AKI: Creatinine 2.23 on admission, up from 0.94 on 12/7, increased today to 2.28.  Management per hospitalist.  Electrolyte derangements: Hypokalemia and hypomagnesemia on admission.  This morning, potassium 3.0, magnesium 1.7.  Hospitalist has ordered supplementation.  Nodular liver:  Noted on multiple imaging studies previously, concerning for underlying cirrhosis.  Notably, platelets and LFTs are normal.  No prior EGD to evaluate for evidence of portal hypertension.  No signs or symptoms of decompensated liver disease.  If cirrhosis, would likely be secondary to NAFLD. No significant history of alcohol use and prior acute hepatitis panel negative.  Consider elastography this admission.  We are also pursuing EGD this admission for N/V which will rule out evidence of portal hypertension.   Plan: Clear liquids diet today.  Consider EGD with possible dilation tomorrow with propofol with Dr. Jenetta Downer. The risks, benefits, and alternatives have been discussed with the patient in detail. The patient states understanding and desires to proceed.  Hold heparin tomorrow morning.  Start PPI daily.  Anusol rectal cream BID Continue supportive measures.  Agree with C. Diff and GI pathogen panel ordered by hospitalist.  Consider elastography liver this admission to evaluate liver nodularity.  Will also check INR.  Electrolyte correction and management of AKI per hospitalist.    LOS: 1 day    10/04/2021, 8:37 AM   Aliene Altes, [facility]

## 2021-10-04 NOTE — Progress Notes (Addendum)
° °  Patient seen and examined at bedside, patient admitted after midnight, please see earlier detailed admission note by Somalia B Zierle-Ghosh, DO. Briefly, patient presented secondary to recurrent nausea, vomiting, abdominal pain  Subjective: Continued nausea/vomiting/abdominal pain. Feeling of food not going down all the way at times. Has tried her best to eat. Feels anxious about eating now because of her abdominal pain symptoms.  BP 119/69 (BP Location: Right Wrist)    Pulse (!) 102    Temp 98.5 F (36.9 C) (Oral)    Resp 18    Ht 5\' 1"  (1.549 m)    Wt 101.9 kg    SpO2 92%    BMI 42.45 kg/m   General exam: Appears calm and comfortable Respiratory system: Clear to auscultation. Respiratory effort normal. Cardiovascular system: S1 & S2 heard, RRR. Gastrointestinal system: Abdomen is nondistended, soft and tender in LUQ/LLQ. Normal bowel sounds heard. Central nervous system: Alert and oriented. No focal neurological deficits. Musculoskeletal: No calf tenderness Skin: No cyanosis. No rashes Psychiatry: Judgement and insight appear normal. Mood & affect appropriate.   Brief assessment/Plan:  Intractable nausea/vomiting Abdominal pain Recurrent issue with recurrent admission. History suggests possible obstruction etiology or contributing factor. -Clear liquid diet -GI consulted. Recommendation for upper endoscopy with dilation  AKI Secondary to above and resultant dehydration/hypovolemia. Given IV fluids in the ED -Start NS IV @ 75 mL/hr  Chronic diastolic heart failure Not in exacerbation -Daily weights and strict in/out -Continue Toprol XL  Hypokalemia -Potassium supplementation  Hyperlipidemia -Continue Lipitor  Left psoas muscle abscess Completed antibiotic treatment on previous admission. Fluid collection improved on repeat imaging. MRI in November also with possible L4 abscess. -Repeat MRI lumbar spine w/ contrast once AKI has resolved  Family communication: None at  bedside DVT prophylaxis: Heparin subq Disposition: Discharge back to ALF likely in 2-4 days  Cordelia Poche, MD [facility] 10/04/2021, 9:04 AM

## 2021-10-04 NOTE — Plan of Care (Signed)

## 2021-10-05 ENCOUNTER — Inpatient Hospital Stay (HOSPITAL_COMMUNITY): Payer: Medicare Other | Admitting: Anesthesiology

## 2021-10-05 ENCOUNTER — Encounter (HOSPITAL_COMMUNITY)
Admission: EM | Disposition: A | Discharge status: Skilled Nursing Facility | Payer: Self-pay | Source: Home / Self Care | Attending: Internal Medicine

## 2021-10-05 ENCOUNTER — Other Ambulatory Visit: Payer: Self-pay

## 2021-10-05 ENCOUNTER — Inpatient Hospital Stay (HOSPITAL_COMMUNITY): Payer: Medicare Other

## 2021-10-05 ENCOUNTER — Encounter (HOSPITAL_COMMUNITY): Payer: Self-pay | Admitting: Family Medicine

## 2021-10-05 DIAGNOSIS — R1013 Epigastric pain: Secondary | ICD-10-CM

## 2021-10-05 DIAGNOSIS — R112 Nausea with vomiting, unspecified: Secondary | ICD-10-CM

## 2021-10-05 DIAGNOSIS — E876 Hypokalemia: Secondary | ICD-10-CM

## 2021-10-05 DIAGNOSIS — M48061 Spinal stenosis, lumbar region without neurogenic claudication: Secondary | ICD-10-CM | POA: Diagnosis not present

## 2021-10-05 DIAGNOSIS — K449 Diaphragmatic hernia without obstruction or gangrene: Secondary | ICD-10-CM

## 2021-10-05 DIAGNOSIS — M5136 Other intervertebral disc degeneration, lumbar region: Secondary | ICD-10-CM | POA: Diagnosis not present

## 2021-10-05 DIAGNOSIS — M4804 Spinal stenosis, thoracic region: Secondary | ICD-10-CM | POA: Diagnosis not present

## 2021-10-05 DIAGNOSIS — K6812 Psoas muscle abscess: Secondary | ICD-10-CM

## 2021-10-05 HISTORY — PX: ESOPHAGOGASTRODUODENOSCOPY (EGD) WITH PROPOFOL: SHX5813

## 2021-10-05 HISTORY — PX: BIOPSY: SHX5522

## 2021-10-05 LAB — BASIC METABOLIC PANEL
Anion gap: 11 (ref 5–15)
BUN: 23 mg/dL (ref 8–23)
CO2: 23 mmol/L (ref 22–32)
Calcium: 7.9 mg/dL — ABNORMAL LOW (ref 8.9–10.3)
Chloride: 104 mmol/L (ref 98–111)
Creatinine, Ser: 1.68 mg/dL — ABNORMAL HIGH (ref 0.44–1.00)
GFR, Estimated: 32 mL/min — ABNORMAL LOW (ref >60)
Glucose, Bld: 82 mg/dL (ref 70–99)
Potassium: 3.3 mmol/L — ABNORMAL LOW (ref 3.5–5.1)
Sodium: 138 mmol/L (ref 135–145)

## 2021-10-05 LAB — SEDIMENTATION RATE: Sed Rate: 75 mm/hr — ABNORMAL HIGH (ref 0–22)

## 2021-10-05 LAB — C-REACTIVE PROTEIN: CRP: 1.8 mg/dL — ABNORMAL HIGH (ref <1.0)

## 2021-10-05 SURGERY — ESOPHAGOGASTRODUODENOSCOPY (EGD) WITH PROPOFOL
Anesthesia: General

## 2021-10-05 MED ORDER — NYSTATIN 100000 UNIT/GM EX POWD
Freq: Two times a day (BID) | CUTANEOUS | Status: DC
  Filled 2021-10-05: qty 15

## 2021-10-05 MED ORDER — LACTATED RINGERS IV SOLN: INTRAVENOUS | Status: DC

## 2021-10-05 MED ORDER — LACTATED RINGERS IV SOLN
INTRAVENOUS | Status: DC | PRN
Start: 2021-10-05 — End: 2021-10-05

## 2021-10-05 MED ORDER — LIDOCAINE HCL (CARDIAC) PF 100 MG/5ML IV SOSY
PREFILLED_SYRINGE | INTRAVENOUS | Status: DC | PRN
  Administered 2021-10-05: 50 mg via INTRAVENOUS

## 2021-10-05 MED ORDER — GADOBUTROL 1 MMOL/ML IV SOLN
10.0000 mL | Freq: Once | INTRAVENOUS | Status: AC | PRN
  Administered 2021-10-05: 16:00:00 10 mL via INTRAVENOUS
  Filled 2021-10-05: qty 10

## 2021-10-05 MED ORDER — PROPOFOL 10 MG/ML IV BOLUS
INTRAVENOUS | Status: DC | PRN
  Administered 2021-10-05: 20 mg via INTRAVENOUS
  Administered 2021-10-05: 70 mg via INTRAVENOUS

## 2021-10-05 MED ORDER — POTASSIUM CHLORIDE 10 MEQ/100ML IV SOLN
10.0000 meq | INTRAVENOUS | Status: AC
  Administered 2021-10-05 (×3): 10 meq via INTRAVENOUS
  Filled 2021-10-05 (×3): qty 100

## 2021-10-05 MED ORDER — ONDANSETRON HCL 4 MG/2ML IJ SOLN
4.0000 mg | Freq: Four times a day (QID) | INTRAMUSCULAR | Status: DC | PRN
  Administered 2021-10-05 – 2021-10-08 (×2): 4 mg via INTRAVENOUS
  Filled 2021-10-05 (×2): qty 2

## 2021-10-05 MED ORDER — LORAZEPAM 2 MG/ML IJ SOLN
1.0000 mg | Freq: Once | INTRAMUSCULAR | Status: AC
  Administered 2021-10-05: 09:00:00 1 mg via INTRAVENOUS
  Filled 2021-10-05: qty 1

## 2021-10-05 NOTE — Anesthesia Postprocedure Evaluation (Signed)
Anesthesia Post Note  Patient: Nicole [name]  Procedure(s) Performed: ESOPHAGOGASTRODUODENOSCOPY (EGD) WITH PROPOFOL BIOPSY  Patient location during evaluation: Phase II Anesthesia Type: General Level of consciousness: awake and alert and oriented Pain management: pain level controlled Vital Signs Assessment: post-procedure vital signs reviewed and stable Respiratory status: spontaneous breathing, nonlabored ventilation and respiratory function stable Cardiovascular status: blood pressure returned to baseline and stable Postop Assessment: no apparent nausea or vomiting Anesthetic complications: no   No notable events documented.   Last Vitals:  Vitals:   10/05/21 1415 10/05/21 1430  BP: (!) 117/96 (!) 152/72  Pulse: 82 80  Resp: 19 (!) 22  Temp:    SpO2: 100% 100%    Last Pain:  Vitals:   10/05/21 1430  TempSrc:   PainSc: 7                  Snigdha Howser C Ina Scrivens

## 2021-10-05 NOTE — Transfer of Care (Signed)
Immediate Anesthesia Transfer of Care Note  Patient: Nicole [name]  Procedure(s) Performed: ESOPHAGOGASTRODUODENOSCOPY (EGD) WITH PROPOFOL BIOPSY  Patient Location: PACU  Anesthesia Type:General  Level of Consciousness: drowsy  Airway & Oxygen Therapy: Patient Spontanous Breathing and Patient connected to nasal cannula oxygen  Post-op Assessment: Report given to RN and Post -op Vital signs reviewed and stable  Post vital signs: Reviewed and stable  Last Vitals:  Vitals Value Taken Time  BP    Temp    Pulse    Resp    SpO2      Last Pain:  Vitals:   10/05/21 1356  TempSrc:   PainSc: 5       Patients Stated Pain Goal: 8 (67/01/10 0349)  Complications: No notable events documented.

## 2021-10-05 NOTE — TOC Progression Note (Signed)
Transition of Care [facility]) - Progression Note    Patient Details  Name: Nicole [name] MRN: [id number] Date of Birth: November 14, 1945  Transition of Care [facility]) CM/SW Contact  Shade Flood, LCSW Phone Number: 10/05/2021, 11:51 AM  Clinical Narrative:     TOC following. Per MD, pt having MRI today. Pt may need IV anbx at dc. Spoke with Melissa at pt's ALF to update and Lenna Sciara confirms that if pt needs IV anbx, she will have to go to SNF as they cannot manage that level of care at the ALF. Melissa also asking for PT evaluation as she feels pt may need SNF rehab even if she doesn't need IV anbx at dc.  Assigned TOC will follow and continue to assess and assist with dc planning.  Expected Discharge Plan: Assisted Living Barriers to Discharge: Continued Medical Work up  Expected Discharge Plan and Services Expected Discharge Plan: Assisted Living In-house Referral: Clinical Social Work   Post Acute Care Choice: Resumption of Svcs/PTA Provider Living arrangements for the past 2 months: [place]                 DME Arranged: N/A DME Agency: NA       HH Arranged: PT [place] Agency: Other - See comment (in house PT) Date [place]: 10/04/21 Time HH Agency Contacted: [id number] Representative spoke with at [place]: In house PT at [place] ([place]) Interventions    Readmission Risk Interventions Readmission Risk Prevention Plan 10/04/2021 09/26/2021  Transportation Screening Complete Complete  HRI or [place] Complete Complete  Social Work Consult for [place] Planning/Counseling Complete Complete  Palliative Care Screening Not Applicable Not Applicable  Medication Review Press photographer) Complete Complete  Some recent data might be hidden

## 2021-10-05 NOTE — Progress Notes (Addendum)
PROGRESS NOTE  Nicole [name] GYJ:[id number] DOB: 1946-07-04 DOA: 10/03/2021 PCP: Jake Samples, PA-C  Brief History:  75 year old female with a history of diastolic CHF, hypertension, polymyalgia rheumatica on chronic steroids presenting with 2-day history of left lower quadrant abdominal pain with vomiting.   The patient has had 2 hospital admissions recently for abdominal pain with intractable nausea and vomiting.  She was initially admitted from 09/15/2021 to 09/11/2021 with similar symptoms.  She was diagnosed with a psoas abscess at that time.  It was felt the abscess was too small to be drained.  The patient was treated with IV antibiotics and discharged home with doxycycline.  She was admitted on 09/24/2021 to 09/29/2021 with nausea and poor oral intake and AKI.  Repeat CT of the abdomen pelvis at that time showed improvement of her psoas abscess.  Patient states that she was feeling well before the prior 3 weeks but then she was presenting with nightly episodes of nausea almost on a daily basis and regurgitation of food contents.  She has also presented with decreased appetite.  The patient also endorsed having some persistent pain in her lower abdomen, which is worsening her left lower quadrant.  Has noticed that her stools are more watery and have been more frequent than usual, up to 5 bowel movements per day with some episode of nocturnal defecation. GI was consulted to assist with management.  Assessment/Plan: Acute kidney injury -Baseline creatinine 0.9-1.1 -serum creatinine peaked 2.28 -Secondary to volume depletion -Continue IV fluids  Intractable nausea/vomiting/abdominal pain -GI consulted -10/04/2021 CT abd--gas and asymmetric soft tissue adjacent to the left L4-5 disc space similar to prior CT on 09/24/2021; no bowel wall thickening.  Stable partially calcified left pelvic mass -10/21/2021 CT abd--decreased size of gas and fluid collection adjacent to left L4-5  disc space. -Planning EGD -Continue Protonix -no BM or emesis in last 24 hours  Psoas abscess -The patient apparently completed antibiotics at the time of her discharge on 09/29/2021 -10/04/2021 CT abdomen--still shows gas and asymmetric soft tissue adjacent to the left L4-5 -Blood cultures x2 sets -Restart antibiotics IV after cultures -MR lumbar spine -ESR -CRP  Chronic diastolic CHF -Clinically hypovolemic -07/21/2019 echo EF 65-70; no WMA, trivial pericardial effusion, normal RV  Hypokalemia/hypomagnesemia -Replete  Rectal bleeding -Anusol per GI service  Mixed hyperlipidemia -Continue statin       Family Communication:   Family at bedside  Consultants:  GI  Code Status:  FULL  DVT Prophylaxis:  [place] Heparin    Procedures: As Listed in Progress Note Above  Antibiotics: None        Subjective: She did not have any vomiting overnight.  She has not had any bowel movements in last 24 hours.  She continues to have left lower quadrant abdominal pain.  She denies any chest pain, shortness breath, hematochezia, melena, headache, fevers, chills  Objective: Vitals:   10/04/21 1008 10/04/21 1252 10/04/21 2118 10/05/21 0500  BP: 116/67 123/68 127/69 130/84  Pulse: 91 89 92 97  Resp:  _0 Temp:  99 F (37.2 C) 98 F (36.7 C) 98.2 F (36.8 C)  TempSrc:  Oral    SpO2:  100% 94% 97%  Weight:    103 kg  Height:        Intake/Output Summary (Last 24 hours) at 10/05/2021 0745 Last data filed at 10/05/2021 0500 Gross per 24 hour  Intake 1249.84 ml  Output --  Net 1249.84 ml   Weight change: 1.1 kg Exam:  General:  Pt is alert, follows commands appropriately, not in acute distress HEENT: No icterus, No thrush, No neck mass, [place]/AT Cardiovascular: RRR, S1/S2, no rubs, no gallops Respiratory: CTA bilaterally, no wheezing, no crackles, no rhonchi Abdomen: Soft/+BS, non tender, non distended, no guarding Extremities: No edema, No lymphangitis, No  petechiae, No rashes, no synovitis Neuro:  CN II-XII intact, strength 4/5 in RUE, RLE, strength 4/5 LUE, LLE; sensation intact bilateral; no dysmetria; babinski equivocal    Data Reviewed: I have personally reviewed following labs and imaging studies Basic Metabolic Panel: Recent Labs  Lab 10/03/21 1657 10/04/21 0503 10/04/21 1708 10/05/21 0556  NA 141 141  --  138  K 3.3* 3.0* 3.9 3.3*  CL 99 104  --  104  CO2 29 22  --  23  GLUCOSE 106* 88  --  82  BUN 27* 27*  --  23  CREATININE 2.23* 2.28*  --  1.68*  CALCIUM 9.1 8.3*  --  7.9*  MG  --  1.3* 2.0  --    Liver Function Tests: Recent Labs  Lab 10/03/21 1657 10/04/21 0503  AST 30 23  ALT 25 21  ALKPHOS 98 78  BILITOT 1.0 1.0  PROT 7.1 5.8*  ALBUMIN 3.4* 2.9*   Recent Labs  Lab 10/03/21 1657  LIPASE 37   No results for input(s): AMMONIA in the last 168 hours. Coagulation Profile: Recent Labs  Lab 10/04/21 1106  INR 1.0   CBC: Recent Labs  Lab 10/03/21 1657 10/04/21 0503  WBC 10.2 7.4  NEUTROABS  --  4.1  HGB 14.5 12.0  HCT 43.9 36.9  MCV 96.1 95.6  PLT 343 303   Cardiac Enzymes: No results for input(s): CKTOTAL, CKMB, CKMBINDEX, TROPONINI in the last 168 hours. BNP: Invalid input(s): POCBNP CBG: No results for input(s): GLUCAP in the last 168 hours. HbA1C: No results for input(s): HGBA1C in the last 72 hours. Urine analysis:    Component Value Date/Time   COLORURINE YELLOW 09/24/2021 1646   APPEARANCEUR CLOUDY (A) 09/24/2021 1646   LABSPEC 1.015 09/24/2021 1646   PHURINE 5.5 09/24/2021 1646   GLUCOSEU NEGATIVE 09/24/2021 1646   HGBUR NEGATIVE 09/24/2021 1646   BILIRUBINUR NEGATIVE 09/24/2021 1646   BILIRUBINUR negative 02/14/2020 1328   KETONESUR NEGATIVE 09/24/2021 1646   PROTEINUR NEGATIVE 09/24/2021 1646   UROBILINOGEN 0.2 02/14/2020 1328   UROBILINOGEN 0.2 02/20/2014 1116   NITRITE NEGATIVE 09/24/2021 1646   LEUKOCYTESUR NEGATIVE 09/24/2021 1646   Sepsis  Labs: _0 (procalcitonin:4,lacticidven:4) ) Recent Results (from the past 240 hour(s))  Resp Panel by RT-PCR (Flu A&B, Covid) Nasopharyngeal Swab     Status: None   Collection Time: 10/03/21  6:50 PM   Specimen: Nasopharyngeal Swab; Nasopharyngeal(NP) swabs in vial transport medium  Result Value Ref Range Status   SARS Coronavirus 2 by RT PCR NEGATIVE NEGATIVE Final    Comment: (NOTE) SARS-CoV-2 target nucleic acids are NOT DETECTED.  The SARS-CoV-2 RNA is generally detectable in upper respiratory specimens during the acute phase of infection. The lowest concentration of SARS-CoV-2 viral copies this assay can detect is 138 copies/mL. A negative result does not preclude SARS-Cov-2 infection and should not be used as the sole basis for treatment or other patient management decisions. A negative result may occur with  improper specimen collection/handling, submission of specimen other than nasopharyngeal swab, presence of viral mutation(s) within the areas targeted by this assay, and inadequate number of viral  copies(<138 copies/mL). A negative result must be combined with clinical observations, patient history, and epidemiological information. The expected result is Negative.  Fact Sheet for Patients:  EntrepreneurPulse.com.au  Fact Sheet for Healthcare Providers:  IncredibleEmployment.be  This test is no t yet approved or cleared by the [place] FDA and  has been authorized for detection and/or diagnosis of SARS-CoV-2 by FDA under an Emergency Use Authorization (EUA). This EUA will remain  in effect (meaning this test can be used) for the duration of the COVID-19 declaration under Section 564(b)(1) of the Act, 21 U.S.C.section 360bbb-3(b)(1), unless the authorization is terminated  or revoked sooner.       Influenza A by PCR NEGATIVE NEGATIVE Final   Influenza B by PCR NEGATIVE NEGATIVE Final    Comment: (NOTE) The Xpert Xpress  SARS-CoV-2/FLU/RSV plus assay is intended as an aid in the diagnosis of influenza from Nasopharyngeal swab specimens and should not be used as a sole basis for treatment. Nasal washings and aspirates are unacceptable for Xpert Xpress SARS-CoV-2/FLU/RSV testing.  Fact Sheet for Patients: EntrepreneurPulse.com.au  Fact Sheet for Healthcare Providers: IncredibleEmployment.be  This test is not yet approved or cleared by the [place] FDA and has been authorized for detection and/or diagnosis of SARS-CoV-2 by FDA under an Emergency Use Authorization (EUA). This EUA will remain in effect (meaning this test can be used) for the duration of the COVID-19 declaration under Section 564(b)(1) of the Act, 21 U.S.C. section 360bbb-3(b)(1), unless the authorization is terminated or revoked.  Performed at [facility], [address] [place], [place] [address]      Scheduled Meds:  atorvastatin  20 mg Oral Daily   calcitRIOL  0.25 mcg Oral Once per day on Mon Wed Fri   heparin  5,000 Units Subcutaneous Q8H   hydrocortisone  1 application Rectal BID   melatonin  6 mg Oral QHS   metoprolol succinate  25 mg Oral Daily   nystatin   Topical BID   pantoprazole  40 mg Oral Daily   traZODone  25 mg Oral QHS   Continuous Infusions:  sodium chloride 75 mL/hr at 10/05/21 0206   potassium chloride     promethazine (PHENERGAN) injection (IM or IVPB)      Procedures/Studies: CT ABDOMEN PELVIS WO CONTRAST  Result Date: 10/03/2021 CLINICAL DATA:  Left lower quadrant abdominal pain EXAM: CT ABDOMEN AND PELVIS WITHOUT CONTRAST TECHNIQUE: Multidetector CT imaging of the abdomen and pelvis was performed following the standard protocol without IV contrast. COMPARISON:  CT abdomen and pelvis dated September 24, 2021 FINDINGS: Lower chest: Bibasilar atelectasis.  Coronary artery calcifications. Hepatobiliary: Mildly nodular liver contour. No focal liver abnormality is seen.  Status post cholecystectomy. No biliary dilatation. Pancreas: Unremarkable. No pancreatic ductal dilatation or surrounding inflammatory changes. Spleen: Normal in size without focal abnormality. Adrenals/Urinary Tract: Adrenal glands are unremarkable. Kidneys are normal, without renal calculi, focal lesion, or hydronephrosis. Bladder is unremarkable. Stomach/Bowel: Stomach is within normal limits. Appendix appears normal. Diverticulosis. No evidence of bowel wall thickening, distention, or inflammatory changes. Vascular/Lymphatic: Aortic atherosclerosis. No enlarged abdominal or pelvic lymph nodes. Reproductive: Stable partially calcified left pelvic mass. Other: Small fat containing umbilical hernia. No abdominopelvic ascites. Musculoskeletal: Gas and asymmetric soft tissue adjacent to the left L4-L5 disc space is similar in appearance when compared with December 3rd 2022 prior. IMPRESSION: 1. No acute findings in the abdomen or pelvis. 2. Gas and asymmetric soft tissue adjacent to the left L4-L5 disc space is similar in appearance when  compared with December 3rd 2022 prior. 3. Stable calcified left pelvic mass. 4. Coronary artery calcifications and aortic Atherosclerosis (ICD10-I70.0). Electronically Signed   By: Yetta Glassman M.D.   On: 10/03/2021 20:36   CT ABDOMEN PELVIS WO CONTRAST  Result Date: 09/24/2021 CLINICAL DATA:  Abdominal abscess. EXAM: CT ABDOMEN AND PELVIS WITHOUT CONTRAST TECHNIQUE: Multidetector CT imaging of the abdomen and pelvis was performed following the standard protocol without IV contrast. COMPARISON:  September 15, 2021 FINDINGS: Lower chest: Atelectasis versus scarring in the left lung base. Calcific atherosclerotic disease of the aorta and coronary arteries. Hepatobiliary: No focal liver abnormality is seen. Status post cholecystectomy. No biliary dilatation. Pancreas: Unremarkable. No pancreatic ductal dilatation or surrounding inflammatory changes. Spleen: Normal in size  without focal abnormality. Adrenals/Urinary Tract: Adrenal glands are unremarkable. Kidneys are normal, without renal calculi, focal lesion, or hydronephrosis. Bladder is unremarkable. Stomach/Bowel: Stomach is within normal limits. Appendix appears normal. No evidence of bowel wall thickening, distention, or inflammatory changes. Vascular/Lymphatic: Aortic atherosclerosis. No enlarged abdominal or pelvic lymph nodes. Reproductive: Stable partially calcified left pelvic mass. Normal appearance of the right ovary and uterus. Other: No abdominal wall hernia or abnormality. No abdominopelvic ascites. Musculoskeletal: Marked arthritic changes of the lumbosacral spine. Scoliosis. Prior lumbar spine decompensation. Decreased in size gas and fluid collection adjacent to left L4-L5 disc space and extending into the left psoas muscle. Prominent left-sided L4-L5 disc bulge. IMPRESSION: 1. Decreased in size known gas and fluid collection adjacent to left L4-L5 disc space, extending into the left psoas muscle. 2. Stable partially calcified left pelvic mass. 3. Calcific atherosclerotic disease of the aorta and coronary arteries. Aortic Atherosclerosis (ICD10-I70.0). Electronically Signed   By: Fidela Salisbury M.D.   On: 09/24/2021 18:13   MR LUMBAR SPINE WO CONTRAST  Result Date: 09/16/2021 CLINICAL DATA:  Back pain common gas seen in psoas muscle on recent CT EXAM: MRI LUMBAR SPINE WITHOUT CONTRAST TECHNIQUE: Multiplanar, multisequence MR imaging of the lumbar spine was performed. No intravenous contrast was administered. COMPARISON:  CT abdomen/pelvis dated 1 day prior, lumbar spine MRI 06/21/2021 FINDINGS: Segmentation: There is transitional anatomy with lumbarization of the S1 vertebral body. For the purposes of this report, there is a rudimentary S1-S2 disc space. Alignment:  There is trace retrolisthesis of L1 on L2 and L2 on L3. Vertebrae: There are postsurgical changes reflecting posterior decompression from L2  through L5. Vertebral body heights are preserved. There is multilevel degenerative endplate marrow signal abnormality throughout the lumbar spine. There is no suspicious marrow signal abnormality. There is no marrow edema to suggest infection. There is no definite evidence of septic arthritis. Conus medullaris and cauda equina: Conus extends to the L1-L2 level. Conus and cauda equina appear normal. Paraspinal and other soft tissues: There is a 2.0 cm x 0.8 cm by 1.5 cm fluid collection in the left psoas muscle at the L5 level. There may be an additional smaller abscess measuring up to 0.8 cm more superiorly at the L4 level (7-15, 11-18). The paraspinal soft tissues are otherwise unremarkable. Disc levels: There is multilevel intervertebral disc space narrowing. Marked T2 hypointensity in the anterior aspect of the L3-L4 and L4-L5 disc spaces is consistent with vacuum disc phenomenon. There is advanced multilevel facet arthropathy with prominent effusions at L4-L5 and L5-S1, similar to the prior study. T12-L1: Is a mild disc bulge and ligamentum flavum thickening resulting in mild spinal canal stenosis and mild-to-moderate bilateral neural foraminal stenosis. L1-L2: Status post posterior decompression. There is a prominent diffuse  disc bulge, degenerative endplate change, and bilateral facet arthropathy resulting in mild spinal canal stenosis and severe bilateral neural foraminal stenosis, unchanged. L2-L3: Status post posterior decompression. There is a mild disc bulge, degenerative endplate change, and bilateral facet arthropathy resulting in severe bilateral neural foraminal stenosis without significant spinal canal stenosis, unchanged. L3-L4: Status post posterior decompression. There is a prominent diffuse disc bulge, degenerative endplate change, and bilateral facet arthropathy resulting in severe bilateral neural foraminal stenosis without significant spinal canal stenosis, unchanged. L4-L5: Status post  posterior decompression. There is a prominent diffuse disc bulge, degenerative endplate change, and bilateral facet arthropathy resulting in mild spinal canal stenosis and severe bilateral neural foraminal stenosis, unchanged. L5-S1: There is a diffuse disc bulge with a left subarticular zone annular fissure, degenerative endplate change, and bilateral facet arthropathy resulting in severe left worse than right neural foraminal stenosis without significant spinal canal stenosis, unchanged. IMPRESSION: 1. Postcontrast images were not obtained as the patient could not complete the study. Within this confines: 2. 2.0 cm fluid collection in the left psoas muscle at the L5 level concerning for abscess with a possible additional smaller abscess at the L4 level. 3. No abnormal marrow edema to suggest adjacent discitis/osteomyelitis, though as above, postcontrast images were not obtained. 4. Severe multilevel degenerative changes as above, not significantly changed since 06/21/2021. Electronically Signed   By: Valetta Mole M.D.   On: 09/16/2021 14:07   MR PELVIS WO CONTRAST  Result Date: 09/16/2021 CLINICAL DATA:  Intramuscular gas within the left iliopsoas muscle on CT. Severe back pain. EXAM: MRI PELVIS WITHOUT CONTRAST TECHNIQUE: Multiplanar multisequence MR imaging of the pelvis was performed. No intravenous contrast was administered. Patient was unable to continue the examination. No contrast was administered. Lumbar spine findings are dictated separately. COMPARISON:  Abdominopelvic CT 09/15/2021 and 06/21/2021. FINDINGS: Bones/Joint/Cartilage Within the pelvis, there is no evidence of acute fracture or dislocation. There are mild degenerative changes of both hips. No evidence of avascular necrosis or significant hip joint effusion. The sacroiliac joints are intact with mild degenerative changes on the left. There is no sacroiliac joint effusion or suspicious subchondral signal abnormality. There is multilevel  lumbar spondylosis status post multilevel posterior decompression. Lumbar spine findings are dictated separately. Ligaments Not relevant for exam/indication. Muscles and Tendons Associated with the gas in the left iliopsoas muscle on recent CT is a small amount of muscular edema as well as a small fluid collection measuring up to 1.8 cm posterior to the muscle at the L5 level. No drainable fluid collection identified. Muscular edema extends into the pelvis without associated focal fluid collection. There is no significant fluid in the iliopsoas bursa. There is asymmetric fatty atrophy of the left gluteus, piriformis and hamstring muscles. No acute tendon tear identified. Soft tissues There is a chronic left adnexal mass measuring 3.7 cm on image 29/7. This demonstrates low T1 and T2 signal, corresponding with calcification on CT. This mass appears grossly unchanged from CTs dating back to 08/20/2013. As above, left paraspinal inflammatory changes and gas within the ileus psoas muscle. No other focal fluid collections are identified. IMPRESSION: 1. There is a small amount of muscular edema within the left iliopsoas muscle, associated with a small paraspinal fluid collection at the L5 level. See separate examination of the lumbar spine. 2. No intrapelvic fluid collections. 3. Mild degenerative changes of the hips and sacroiliac joints. 4. Stable partially calcified left adnexal mass from at least 2014, consistent with a benign finding. Electronically Signed  By: Richardean Sale M.D.   On: 09/16/2021 13:50   CT Abdomen Pelvis W Contrast  Result Date: 09/15/2021 CLINICAL DATA:  75 year old female with acute LEFT abdominal and pelvic pain for 2 days. EXAM: CT ABDOMEN AND PELVIS WITH CONTRAST TECHNIQUE: Multidetector CT imaging of the abdomen and pelvis was performed using the standard protocol following bolus administration of intravenous contrast. CONTRAST:  38m OMNIPAQUE IOHEXOL 300 MG/ML  SOLN COMPARISON:   06/21/2021 CT and prior studies FINDINGS: Lower chest: No acute abnormality Hepatobiliary: Mildly nodular hepatic contour or likely represent cirrhosis. No other hepatic abnormalities are noted. Patient is status post cholecystectomy. No biliary dilatation. Pancreas: Unremarkable Spleen: Unremarkable Adrenals/Urinary Tract: The kidneys, adrenal glands and bladder are unremarkable. No hydronephrosis or renal calculi. Stomach/Bowel: Scattered diverticula within the descending and sigmoid colon noted without evidence of acute diverticulitis. There is no evidence of bowel obstruction or focal collection. The appendix is normal. Vascular/Lymphatic: Aortic atherosclerosis. No enlarged abdominal or pelvic lymph nodes. Reproductive: No new abnormalities. A 4 cm LEFT adnexal mass appears unchanged from remote studies. Other: No ascites or pneumoperitoneum. Musculoskeletal: Again noted is moderate to severe multilevel degenerative disc disease, spondylosis and facet arthropathy throughout the lumbar spine with vacuum disc phenomenon. However, now noted are foci of gas within the LEFT psoas muscle which appears to extend from the L3-4 or L4-5 vacuum discs. No definite LEFT psoas focal collection or enhancement is identified. IMPRESSION: 1. New foci of gas within the LEFT psoas muscle which appears to extend from L3-4 or L4-5 degenerative vacuum discs. Although this may represent noninfectious extension of gas from the disc space, infection is not excluded although there is no definite evidence of focal collection/enhancement of the LEFT psoas muscle. Recommend MRI with and without contrast for further evaluation as clinically indicated. 2. Mildly nodular hepatic contour suspicious for cirrhosis. 3. Aortic Atherosclerosis (ICD10-I70.0). Electronically Signed   By: JMargarette CanadaM.D.   On: 09/15/2021 12:46   DG CHEST PORT 1 VIEW  Result Date: 10/03/2021 CLINICAL DATA:  Abdominal pain, nausea, vomiting and diarrhea. EXAM:  PORTABLE CHEST 1 VIEW COMPARISON:  Portable chest 07/20/2021 FINDINGS: The heart enlarged but unchanged. No vascular congestion is seen. The aorta is tortuous patchy calcification and mild ectasia. Linear scarring or atelectasis again noted left mid field. The lungs are otherwise clear. No pleural effusion is seen. Advanced bilateral shoulder DJD. IMPRESSION: No evidence of acute chest disease or interval changes. Cardiomegaly. Stable chest. Electronically Signed   By: KTelford NabM.D.   On: 10/03/2021 20:32   [place]domen Limited RUQ (LIVER/GB)  Result Date: 09/16/2021 CLINICAL DATA:  Abdomen pain EXAM: ULTRASOUND ABDOMEN LIMITED RIGHT UPPER QUADRANT COMPARISON:  CT 09/15/2021. FINDINGS: Gallbladder: Status post cholecystectomy. Common bile duct: Diameter: 1.8 mm Liver: Slightly nodular hepatic contour. No focal hepatic abnormality. Liver is slightly echogenic. Portal vein is patent on color Doppler imaging with normal direction of blood flow towards the liver. Other: None. IMPRESSION: 1. Status post cholecystectomy without biliary dilatation 2. Slightly echogenic nodular liver suspicious for cirrhosis Electronically Signed   By: KDonavan FoilM.D.   On: 09/16/2021 19:53    DOrson Eva DO  [facility]  If 7PM-7AM, please contact night-coverage [email or web address] Password [email or web address] 10/05/2021, 7:45 AM   LOS: 2 days

## 2021-10-05 NOTE — Progress Notes (Signed)
We will proceed with EGD as scheduled.  I thoroughly discussed with the patient his procedure, including the risks involved. Patient understands what the procedure involves including the benefits and any risks. Patient understands alternatives to the proposed procedure. Risks including (but not limited to) bleeding, tearing of the lining (perforation), rupture of adjacent organs, problems with heart and lung function, infection, and medication reactions. A small percentage of complications may require surgery, hospitalization, repeat endoscopic procedure, and/or transfusion.  Patient understood and agreed.  Nicole Harts Castaneda, MD Gastroenterology and Hepatology [facility]  

## 2021-10-05 NOTE — Op Note (Signed)
[facility] Patient Name: Nicole [name] Procedure Date: 10/05/2021 1:43 PM MRN: [id number] Date of Birth: 1946-08-21 Attending MD: Maylon Peppers ,  CSN: [id number] Age: 75 Admit Type: Inpatient Procedure:                Upper GI endoscopy Indications:              Epigastric abdominal pain, Nausea with vomiting Providers:                Maylon Peppers, Lurline Del, RN, Kristine L. Risa Grill, Technician Referring MD:              Medicines:                Monitored Anesthesia Care Complications:            No immediate complications. Estimated Blood Loss:     Estimated blood loss: none. Procedure:                Pre-Anesthesia Assessment:                           - Prior to the procedure, a History and Physical                            was performed, and patient medications, allergies                            and sensitivities were reviewed. The patient's                            tolerance of previous anesthesia was reviewed.                           - The risks and benefits of the procedure and the                            sedation options and risks were discussed with the                            patient. All questions were answered and informed                            consent was obtained.                           - ASA Grade Assessment: III - A patient with severe                            systemic disease.                           After obtaining informed consent, the endoscope was                            passed under direct vision. Throughout  the                            procedure, the patient's blood pressure, pulse, and                            oxygen saturations were monitored continuously. The                            [id number]-H190 ([id number]) scope was introduced through the                            mouth, and advanced to the third part of duodenum.                            The upper GI endoscopy was accomplished  without                            difficulty. The patient tolerated the procedure                            well. Scope In: 1:56:53 PM Scope Out: 2:01:04 PM Total Procedure Duration: 0 hours 4 minutes 11 seconds  Findings:      The examined esophagus was normal.      A 1 cm hiatal hernia was present.      The entire examined stomach was normal. Biopsies were taken with a cold       forceps for Helicobacter pylori testing.      The examined duodenum was normal. Impression:               - Normal esophagus.                           - 1 cm hiatal hernia.                           - Normal stomach. Biopsied.                           - Normal examined duodenum. Moderate Sedation:      Per Anesthesia Care Recommendation:           - Return patient to hospital ward for ongoing care.                           - Advance diet as tolerated.                           - Await pathology results.                           - Zofran as needed for nausea                           - follow up c. diff and GI pathogen studies, Procedure Code(s):        --- Professional ---  42876, Esophagogastroduodenoscopy, flexible,                            transoral; with biopsy, single or multiple Diagnosis Code(s):        --- Professional ---                           K44.9, Diaphragmatic hernia without obstruction or                            gangrene                           R10.13, Epigastric pain                           R11.2, Nausea with vomiting, unspecified CPT copyright 2019 American Medical Association. All rights reserved. The codes documented in this report are preliminary and upon coder review may  be revised to meet current compliance requirements. Maylon Peppers, MD Maylon Peppers,  10/05/2021 2:08:23 PM This report has been signed electronically. Number of Addenda: 0

## 2021-10-05 NOTE — Brief Op Note (Signed)
10/03/2021 - 10/05/2021  2:08 PM  PATIENT:  Nicole [name]  75 y.o. female  PRE-OPERATIVE DIAGNOSIS:  nause vomiting dysphagia  POST-OPERATIVE DIAGNOSIS:  Hiatal Hernia   PROCEDURE:  Procedure(s): ESOPHAGOGASTRODUODENOSCOPY (EGD) WITH PROPOFOL (N/A) BIOPSY  SURGEON:  Surgeon(s) and Role:    * Harvel Quale, MD - Primary  Patient underwent EGD under propofol sedation.  Tolerated the procedure adequately.  Esophagogastroduodenospy showed presence of 1 cm hiatal hernia.  Stomach was normal, was biopsied to rule out H. pylori.  Duodenum was completely normal up to the third portion of the duodenum.  RECOMMENDATIONS: - Return patient to hospital ward for ongoing care.  - Advance diet as tolerated.  - Await pathology results.  - Zofran as needed for nausea - follow up c. diff and GI pathogen studies,  Maylon Peppers, MD Gastroenterology and Hepatology [facility]

## 2021-10-05 NOTE — Anesthesia Preprocedure Evaluation (Addendum)
Anesthesia Evaluation  Patient identified by MRN, date of birth, ID band Patient awake    Reviewed: Allergy & Precautions, NPO status , Patient's Chart, lab work & pertinent test results, reviewed documented beta blocker date and time   Airway Mallampati: III  TM Distance: >3 FB Neck ROM: Full    Dental  (+) Dental Advisory Given, Missing, Edentulous Upper   Pulmonary neg pulmonary ROS,    Pulmonary exam normal breath sounds clear to auscultation       Cardiovascular hypertension, Pt. on medications and Pt. on home beta blockers +CHF  Normal cardiovascular exam Rhythm:Regular Rate:Normal  1. Limited study, no Doppler performed.  2. Left ventricular ejection fraction, by estimation, is 65 to 70%. The left ventricle has normal function. The left ventricle has no regional wall motion abnormalities. There is moderate left ventricular hypertrophy.  3. Right ventricular systolic function is normal. The right ventricular size is normal.  4. There is a trivial pericardial effusion anterior to the right  ventricle.  5. The mitral valve is grossly normal.  6. The aortic valve is tricuspid. Aortic valve regurgitation is not visualized. Mild to moderate aortic valve sclerosis/calcification is  present, without any evidence of aortic stenosis.  7. Aortic dilatation noted. There is mild dilatation of the ascending aorta, measuring 41 mm.  8. The inferior vena cava is normal in size with greater than 50% respiratory variability, suggesting right atrial pressure of 3 mmHg.   Comparison(s): Prior images reviewed side by side. LVEF remains vigorous   Neuro/Psych  Headaches,  Neuromuscular disease    GI/Hepatic GERD  Medicated,  Endo/Other  negative endocrine ROS  Renal/GU Renal InsufficiencyRenal disease (AKI)     Musculoskeletal  (+) Arthritis , Polymyalgia rheumatica    Abdominal   Peds  Hematology  (+) anemia ,    Anesthesia Other Findings On prednisone   Reproductive/Obstetrics negative OB ROS                            Anesthesia Physical Anesthesia Plan  ASA: 3  Anesthesia Plan: General   Post-op Pain Management: Minimal or no pain anticipated   Induction: Intravenous  PONV Risk Score and Plan: TIVA  Airway Management Planned: Nasal Cannula and Natural Airway  Additional Equipment:   Intra-op Plan:   Post-operative Plan: Possible Post-op intubation/ventilation  Informed Consent: I have reviewed the patients History and Physical, chart, labs and discussed the procedure including the risks, benefits and alternatives for the proposed anesthesia with the patient or authorized representative who has indicated his/her understanding and acceptance.     Dental advisory given  Plan Discussed with: CRNA and Surgeon  Anesthesia Plan Comments:        Anesthesia Quick Evaluation

## 2021-10-06 ENCOUNTER — Inpatient Hospital Stay: Payer: Self-pay

## 2021-10-06 LAB — CBC
HCT: 33 % — ABNORMAL LOW (ref 36.0–46.0)
Hemoglobin: 10.7 g/dL — ABNORMAL LOW (ref 12.0–15.0)
MCH: 32.1 pg (ref 26.0–34.0)
MCHC: 32.4 g/dL (ref 30.0–36.0)
MCV: 99.1 fL (ref 80.0–100.0)
Platelets: 263 10*3/uL (ref 150–400)
RBC: 3.33 MIL/uL — ABNORMAL LOW (ref 3.87–5.11)
RDW: 14.6 % (ref 11.5–15.5)
WBC: 6.7 10*3/uL (ref 4.0–10.5)
nRBC: 0 % (ref 0.0–0.2)

## 2021-10-06 LAB — SURGICAL PATHOLOGY

## 2021-10-06 LAB — BASIC METABOLIC PANEL
Anion gap: 11 (ref 5–15)
BUN: 12 mg/dL (ref 8–23)
CO2: 22 mmol/L (ref 22–32)
Calcium: 8.2 mg/dL — ABNORMAL LOW (ref 8.9–10.3)
Chloride: 107 mmol/L (ref 98–111)
Creatinine, Ser: 0.97 mg/dL (ref 0.44–1.00)
GFR, Estimated: >60 mL/min (ref >60)
Glucose, Bld: 69 mg/dL — ABNORMAL LOW (ref 70–99)
Potassium: 3.5 mmol/L (ref 3.5–5.1)
Sodium: 140 mmol/L (ref 135–145)

## 2021-10-06 LAB — MAGNESIUM: Magnesium: 1.7 mg/dL (ref 1.7–2.4)

## 2021-10-06 MED ORDER — SODIUM CHLORIDE 0.9% FLUSH: 10.0000 mL | INTRAVENOUS | Status: DC | PRN

## 2021-10-06 MED ORDER — CHLORHEXIDINE GLUCONATE CLOTH 2 % EX PADS
6.0000 | MEDICATED_PAD | Freq: Every day | CUTANEOUS | Status: DC
  Administered 2021-10-06 – 2021-10-08 (×3): 6 via TOPICAL

## 2021-10-06 MED ORDER — SODIUM CHLORIDE 0.9 % IV SOLN
2.0000 g | INTRAVENOUS | Status: DC
  Administered 2021-10-06 – 2021-10-08 (×3): 2 g via INTRAVENOUS
  Filled 2021-10-06 (×3): qty 20

## 2021-10-06 MED ORDER — ONDANSETRON HCL 4 MG PO TABS
4.0000 mg | ORAL_TABLET | Freq: Three times a day (TID) | ORAL | Status: DC
  Administered 2021-10-06 – 2021-10-08 (×8): 4 mg via ORAL
  Filled 2021-10-06 (×9): qty 1

## 2021-10-06 MED ORDER — SODIUM CHLORIDE 0.9% FLUSH
10.0000 mL | Freq: Two times a day (BID) | INTRAVENOUS | Status: DC
Start: 2021-10-06 — End: 2021-10-08
  Administered 2021-10-07 – 2021-10-08 (×2): 10 mL

## 2021-10-06 MED ORDER — MAGNESIUM SULFATE 2 GM/50ML IV SOLN
2.0000 g | Freq: Once | INTRAVENOUS | Status: AC
  Administered 2021-10-06: 2 g via INTRAVENOUS
  Filled 2021-10-06: qty 50

## 2021-10-06 MED ORDER — VANCOMYCIN HCL 1250 MG/250ML IV SOLN
1250.0000 mg | INTRAVENOUS | Status: DC
  Administered 2021-10-07 – 2021-10-08 (×2): 1250 mg via INTRAVENOUS
  Filled 2021-10-06 (×2): qty 250

## 2021-10-06 MED ORDER — VANCOMYCIN HCL 2000 MG/400ML IV SOLN
2000.0000 mg | Freq: Once | INTRAVENOUS | Status: AC
  Administered 2021-10-06: 2000 mg via INTRAVENOUS
  Filled 2021-10-06 (×2): qty 400

## 2021-10-06 MED ORDER — DIPHENHYDRAMINE HCL 25 MG PO CAPS
25.0000 mg | ORAL_CAPSULE | Freq: Once | ORAL | Status: AC
  Administered 2021-10-06: 25 mg via ORAL
  Filled 2021-10-06: qty 1

## 2021-10-06 NOTE — NC FL2 (Deleted)
[place] LEVEL OF CARE SCREENING TOOL     IDENTIFICATION  Patient Name: Nicole [name] Birthdate: [birth year] Sex: female Admission Date (Current Location): 10/03/2021  [facility] and [place] Number:  [employer] and Address:  [place] [address] [place]      Provider Number: [id number]  Attending Physician Name and Address:  Orson Eva, MD  Relative Name and Phone Number:  Vonzell Schlatter (Daughter)   [phone]    Current Level of Care: Hospital Recommended Level of Care: [place] Prior Approval Number:    Date Approved/Denied:   PASRR Number: [id number]  Discharge Plan: SNF    Current Diagnoses: Patient Active Problem List   Diagnosis Date Noted   Psoas abscess ([place]) 10/05/2021   Intractable nausea and vomiting 09/24/2021   Myositis 09/16/2021   Psoas abscess, left (2.0 cm on MRI)  09/16/2021   AKI (acute kidney injury) ([place]) 50/27/7412   Acute metabolic encephalopathy 87/86/7672   Hypokalemia 07/20/2021   Elevated MCV 07/20/2021   Mixed hyperlipidemia 07/20/2021   Diverticulitis 05/02/2018   Diverticulitis of colon 05/01/2018   Morbid obesity with BMI of 50.0-59.9, adult ([place]) 05/01/2018   Chronic diastolic HF (heart failure) ([place]) 05/01/2018   On prednisone therapy 05/31/2017   Positive anti-CCP test 05/31/2017   Rheumatoid factor positive 05/31/2017   Memory loss 04/04/2017   Polymyalgia rheumatica ([place]) 04/04/2017   High risk medication use 12/07/2016   DDD (degenerative disc disease), lumbar 09/27/2016   HTN (hypertension) 06/05/2016   Chronic pain syndrome 06/05/2016   Acute diverticulitis 06/02/2016   Diverticulitis large intestine 06/02/2016   Spinal stenosis of lumbosacral region 02/29/2016   Joint pain 02/29/2016   Endometrial polyp 02/06/2014   Postmenopausal bleeding 01/30/2014   GERD (gastroesophageal reflux disease) 09/29/2013   Spinal stenosis, thoracic 09/29/2013    Shingles 09/29/2013   Thoracic or lumbosacral neuritis or radiculitis, unspecified 05/04/2011   Abnormality of gait 05/04/2011   Muscle weakness (generalized) 05/04/2011   Bilateral primary osteoarthritis of knee 04/07/2009   KNEE PAIN 04/07/2009    Orientation RESPIRATION BLADDER Height & Weight     Self, Time, Situation, Place  Normal Continent Weight: 234 lb 9.1 oz (106.4 kg) Height:  5\' 1"  (154.9 cm)  BEHAVIORAL SYMPTOMS/MOOD NEUROLOGICAL BOWEL NUTRITION STATUS      Continent Diet (Soft. See d/c for updates.)  AMBULATORY STATUS COMMUNICATION OF NEEDS Skin   Extensive Assist Verbally Bruising                       Personal Care Assistance Level of Assistance  Bathing, Feeding, Dressing Bathing Assistance: Maximum assistance Feeding assistance: Limited assistance Dressing Assistance: Maximum assistance     Functional Limitations Info  Sight, Hearing, Speech Sight Info: Impaired Hearing Info: Adequate Speech Info: Adequate    SPECIAL CARE FACTORS FREQUENCY  PT (By licensed PT)     PT Frequency: 5x weekly              Contractures Contractures Info: Not present    Additional Factors Info  Code Status, Allergies, Psychotropic Code Status Info: Full Allergies Info: Oxycontin (oxycodone Hcl), Sulfa Antibiotics, Tramadol, Penicillins, Sulfasalazine Psychotropic Info: Xanax, Trazodone         Current Medications (10/06/2021):  This is the current hospital active medication list Current Facility-Administered Medications  Medication Dose Route Frequency Provider Last Rate Last Admin   0.9 %  sodium chloride infusion   Intravenous Continuous Mariel Aloe, MD  75 mL/hr at 10/05/21 0206 New Bag at 10/05/21 0206   acetaminophen (TYLENOL) tablet 650 mg  650 mg Oral Q6H PRN Zierle-Ghosh, Asia B, DO   650 mg at 10/05/21 0157   Or   acetaminophen (TYLENOL) suppository 650 mg  650 mg Rectal Q6H PRN Zierle-Ghosh, Asia B, DO       ALPRAZolam (XANAX) tablet 0.25 mg   0.25 mg Oral BID PRN Zierle-Ghosh, Asia B, DO   0.25 mg at 10/05/21 2138   atorvastatin (LIPITOR) tablet 20 mg  20 mg Oral Daily Zierle-Ghosh, Asia B, DO   20 mg at 10/06/21 1016   calcitRIOL (ROCALTROL) capsule 0.25 mcg  0.25 mcg Oral Once per day on Mon Wed Fri Zierle-Ghosh, [place] B, DO   0.25 mcg at 10/05/21 4128   cefTRIAXone (ROCEPHIN) 2 g in sodium chloride 0.9 % 100 mL IVPB  2 g Intravenous Q24H Tat, Shanon Brow, MD 200 mL/hr at 10/06/21 1020 2 g at 10/06/21 1020   heparin injection 5,000 Units  5,000 Units Subcutaneous Q8H Zierle-Ghosh, Asia B, DO   5,000 Units at 10/06/21 0433   hydrocortisone (ANUSOL-HC) 2.5 % rectal cream 1 application  1 application Rectal BID Erenest Rasher, PA-C   1 application at 78/67/67 2137   melatonin tablet 6 mg  6 mg Oral QHS Zierle-Ghosh, Asia B, DO   6 mg at 10/05/21 2138   metoprolol succinate (TOPROL-XL) 24 hr tablet 25 mg  25 mg Oral Daily Zierle-Ghosh, Asia B, DO   25 mg at 10/06/21 1016   morphine 2 MG/ML injection 2 mg  2 mg Intravenous Q2H PRN Zierle-Ghosh, Asia B, DO   2 mg at 10/06/21 1014   nystatin (MYCOSTATIN/NYSTOP) topical powder   Topical BID Zierle-Ghosh, Asia B, DO   Given at 10/06/21 1017   ondansetron (ZOFRAN) injection 4 mg  4 mg Intravenous Q6H PRN Tat, Shanon Brow, MD   4 mg at 10/05/21 1055   ondansetron (ZOFRAN) tablet 4 mg  4 mg Oral TID AC & HS Mahala Menghini, PA-C       pantoprazole (PROTONIX) EC tablet 40 mg  40 mg Oral Daily Harper, Kristen S, PA-C   40 mg at 10/06/21 1016   promethazine (PHENERGAN) tablet 12.5 mg  12.5 mg Oral Q6H PRN Zierle-Ghosh, Asia B, DO   12.5 mg at 10/04/21 2094   Or   promethazine (PHENERGAN) 12.5 mg in sodium chloride 0.9 % 50 mL IVPB  12.5 mg Intravenous Q6H PRN Zierle-Ghosh, Asia B, DO       traZODone (DESYREL) tablet 25 mg  25 mg Oral QHS Zierle-Ghosh, Asia B, DO   25 mg at 10/05/21 2140   vancomycin (VANCOREADY) IVPB 2000 mg/400 mL  2,000 mg Intravenous Once Tat, Shanon Brow, MD 200 mL/hr at 10/06/21 1214 2,000 mg  at 10/06/21 1214     Discharge Medications: Please see discharge summary for a list of discharge medications.  Relevant Imaging Results:  Relevant Lab Results:   Additional Information PT SSN: [id number]. COVID vaccine: 06/02/20, 06/23/20  Salome Arnt, LCSW

## 2021-10-06 NOTE — Evaluation (Signed)
Physical Therapy Evaluation Patient Details Name: Nicole [name] MRN: [id number] DOB: 01/16/1946 Today's Date: 10/06/2021  History of Present Illness  75 year old female with a history of diastolic CHF, hypertension, polymyalgia rheumatica on chronic steroids presenting with 2-day history of left lower quadrant abdominal pain with vomiting.   The patient has had 2 hospital admissions recently for abdominal pain with intractable nausea and vomiting.  She was initially admitted from 09/15/2021 to 09/11/2021 with similar symptoms.  She was diagnosed with a psoas abscess at that time.  It was felt the abscess was too small to be drained.  The patient was treated with IV antibiotics and discharged home with doxycycline.  She was admitted on 09/24/2021 to 09/29/2021 with nausea and poor oral intake and AKI.  Repeat CT of the abdomen pelvis at that time showed improvement of her psoas abscess.     Patient states that she was feeling well before the prior 3 weeks but then she was presenting with nightly episodes of nausea almost on a daily basis and regurgitation of food contents.  She has also presented with decreased appetite.  The patient also endorsed having some persistent pain in her lower abdomen, which is worsening her left lower quadrant.  Has noticed that her stools are more watery and have been more frequent than usual, up to 5 bowel movements per day with some episode of nocturnal defecation.  GI was consulted to assist with management.   Clinical Impression  Nursing in room administering medications. Nursing reports patient received IV morphine approximately 10 minutes ago. Patient agreeable to participating in PT evaluation today. Patient reports she had not been walking with RW at ALF since her last admission as she still has been struggling with her ability to keep food down. Today, patient requires bed rails for bed mobility. She requires assistance for lower extremities for sit to supine. Patient able  to complete sit to stand with min guard for safety but unable to attain a full upright posture due to low back and right leg pain complaints. Ambulated using a slow, labored cadence with RW limited to 4-5 sidesteps to the right along bedside due to pain complaints and patient inability to stand upright with approximately 60 degrees of flexion at the hips and heavy UE use on RW. Patient on room air throughout session. At end of session, patient requested lying down flat until pain eased out.  Patient would continue to benefit from skilled physical therapy in current environment and next venue to continue return to prior function and increase strength, endurance, balance, coordination, and functional mobility and gait skills.       Recommendations for follow up therapy are one component of a multi-disciplinary discharge planning process, led by the attending physician.  Recommendations may be updated based on patient status, additional functional criteria and insurance authorization.  Follow Up Recommendations Skilled nursing-short term rehab (<3 hours/day)    Assistance Recommended at Discharge Intermittent Supervision/Assistance  Functional Status Assessment Patient has had a recent decline in their functional status and demonstrates the ability to make significant improvements in function in a reasonable and predictable amount of time.  Equipment Recommendations  None recommended by PT    Recommendations for Other Services       Precautions / Restrictions Precautions Precautions: Fall Restrictions Weight Bearing Restrictions: No      Mobility  Bed Mobility Overal bed mobility: Needs Assistance Bed Mobility: Supine to Sit;Sit to Supine;Rolling Rolling: Modified independent (Device/Increase time) (heavy UE use on bedrails)  Supine to sit: Supervision;HOB elevated (use of bedrail) Sit to supine: Min assist (assist for lower extremities)        Transfers Overall transfer level:  Needs assistance Equipment used: Rolling walker (2 wheels) Transfers: Sit to/from Stand Sit to Stand: Min guard           General transfer comment: patient unable to attain full upright position due to complaints of LBP and right lower extremity pain    Ambulation/Gait Ambulation/Gait assistance: Min guard Gait Distance (Feet): 3 Feet Assistive device: Rolling walker (2 wheels) Gait Pattern/deviations: Step-to pattern;Decreased stance time - right;Decreased step length - right;Decreased step length - left;Decreased stride length;Trunk flexed;Wide base of support;Antalgic Gait velocity: decreased     General Gait Details: slow, labored cadence with RW limited to 4-5 sidesteps to the right along bedside due to pain complaints and patient inability to stand upright with approximately 60 degrees of flexion at the hips and heavy UE use on RW; on room air.  Stairs            Wheelchair Mobility    Modified Rankin (Stroke Patients Only)       Balance Overall balance assessment: Needs assistance Sitting-balance support: Feet supported;No upper extremity supported Sitting balance-Leahy Scale: Good Sitting balance - Comments: seated at EOB   Standing balance support: Reliant on assistive device for balance;Bilateral upper extremity supported Standing balance-Leahy Scale: Poor Standing balance comment: leaning on RW with approximately 60 of flexion at the hips         Pertinent Vitals/Pain Pain Assessment: 0-10 Pain Score: 7  Pain Location: low back down to right foot Pain Descriptors / Indicators: Sharp;Shooting Pain Intervention(s): Limited activity within patient's tolerance;Premedicated before session (morphine)    Home Living Family/patient expects to be discharged to:: Assisted living     Home Equipment: BSC/3in1;Wheelchair - Publishing copy (2 wheels)      Prior Function Prior Level of Function : Needs assist       Physical Assist : Mobility  (physical) Mobility (physical): Bed mobility;Transfers   Mobility Comments: Pt is wheelchair bound at baseline, uses RW for bathroom accessibility and transfers to wheelchair/bed. ADLs Comments: ALF staff and niece assist with ADLs     Hand Dominance   Dominant Hand: Right    Extremity/Trunk Assessment   Upper Extremity Assessment Upper Extremity Assessment: Generalized weakness    Lower Extremity Assessment Lower Extremity Assessment: Generalized weakness    Cervical / Trunk Assessment Cervical / Trunk Assessment: Kyphotic  Communication   Communication: No difficulties  Cognition Arousal/Alertness: Awake/alert Behavior During Therapy: WFL for tasks assessed/performed Overall Cognitive Status: Within Functional Limits for tasks assessed          General Comments      Exercises     Assessment/Plan    PT Assessment Patient needs continued PT services  PT Problem List Decreased strength;Decreased mobility;Obesity;Decreased activity tolerance;Decreased balance;Pain       PT Treatment Interventions DME instruction;Therapeutic exercise;Gait training;Balance training;Neuromuscular re-education;Functional mobility training;Therapeutic activities;Patient/family education;Wheelchair mobility training    PT Goals (Current goals can be found in the Care Plan section)  Acute Rehab PT Goals Patient Stated Goal: Go to rehab and then ALF PT Goal Formulation: With patient Time For Goal Achievement: 10/20/21 Potential to Achieve Goals: Fair    Frequency Min 2X/week   Barriers to discharge           AM-PAC PT "6 Clicks" Mobility  Outcome Measure Help needed turning from your back to your side while in  a flat bed without using bedrails?: A Little Help needed moving from lying on your back to sitting on the side of a flat bed without using bedrails?: A Little Help needed moving to and from a bed to a chair (including a wheelchair)?: A Lot Help needed standing up from a  chair using your arms (e.g., wheelchair or bedside chair)?: A Little Help needed to walk in hospital room?: A Lot Help needed climbing 3-5 steps with a railing? : Total 6 Click Score: 14    End of Session Equipment Utilized During Treatment: Gait belt Activity Tolerance: Patient limited by pain Patient left: in bed;with call bell/phone within reach;with bed alarm set Nurse Communication: Mobility status PT Visit Diagnosis: Unsteadiness on feet (R26.81);Difficulty in walking, not elsewhere classified (R26.2);Other abnormalities of gait and mobility (R26.89);Muscle weakness (generalized) (M62.81);Pain Pain - Right/Left: Right Pain - part of body: Leg    Time: 1030-1055 PT Time Calculation (min) (ACUTE ONLY): 25 min   Charges:   PT Evaluation $PT Eval Low Complexity: 1 Low PT Treatments $Therapeutic Activity: 8-22 mins        Floria Raveling. Hartnett-Rands, MS, PT Per [place] [phone]  Pamala Hurry  Hartnett-Rands 10/06/2021, 11:26 AM

## 2021-10-06 NOTE — Progress Notes (Signed)
Assisted to bedside commode today severa times with assist of two,  Picc now in left upper arm.  New tubing and NS bag with date sticker applied.

## 2021-10-06 NOTE — TOC Progression Note (Addendum)
Transition of Care [facility]) - Progression Note    Patient Details  Name: Nicole [name] MRN: [id number] Date of Birth: 21-Mar-1946  [facility] CM/SW Contact  Salome Arnt, [place] Phone Number: 10/06/2021, 3:15 PM  Clinical Narrative:   LCSW spoke with pt about need for SNF for IV antibiotics and rehab. Pt defers to son. After conversation with MD, son and pt are agreeable to SNF. Faxed referral to requested facilities. Will follow up with bed offers. Pt's son requests to call [facility] himself to let them know pt will d/c to SNF.     Expected Discharge Plan: Assisted Living Barriers to Discharge: Continued Medical Work up  Expected Discharge Plan and Services Expected Discharge Plan: Assisted Living In-house Referral: Clinical Social Work   Post Acute Care Choice: Resumption of Svcs/PTA Provider Living arrangements for the past 2 months: [place]                 DME Arranged: N/A DME Agency: NA       HH Arranged: PT [place] Agency: Other - See comment (in house PT) Date [place]: 10/04/21 Time HH Agency Contacted: 0840 Representative spoke with at [place]: In house PT at [place] ([place]) Interventions    Readmission Risk Interventions Readmission Risk Prevention Plan 10/04/2021 09/26/2021  Transportation Screening Complete Complete  HRI or [place] Complete Complete  Social Work Consult for [place] Planning/Counseling Complete Complete  Palliative Care Screening Not Applicable Not Applicable  Medication Review [employer]) Complete Complete  Some recent data might be hidden

## 2021-10-06 NOTE — Plan of Care (Signed)
°  Problem: Acute Rehab PT Goals(only PT should resolve) Goal: Pt will Roll Supine to Side Outcome: Progressing Flowsheets (Taken 10/06/2021 1235) Pt will Roll Supine to Side: with modified independence Goal: Pt Will Go Supine/Side To Sit Outcome: Progressing Flowsheets (Taken 10/06/2021 1235) Pt will go Supine/Side to Sit:  with minimal assist  with min guard assist Goal: Pt Will Go Sit To Supine/Side Outcome: Progressing Flowsheets (Taken 10/06/2021 1235) Pt will go Sit to Supine/Side:  with min guard assist  with minimal assist Goal: Patient Will Transfer Sit To/From Stand Outcome: Progressing Flowsheets (Taken 10/06/2021 1235) Patient will transfer sit to/from stand: with min guard assist Note: In more erect posturing <60 degrees flexion at hips Goal: Pt Will Transfer Bed To Chair/Chair To Bed Outcome: Progressing Flowsheets (Taken 10/06/2021 1235) Pt will Transfer Bed to Chair/Chair to Bed: with mod assist Goal: Pt Will Ambulate Outcome: Progressing Flowsheets (Taken 10/06/2021 1235) Pt will Ambulate:  10 feet  with moderate assist  with least restrictive assistive device   Pamala Hurry D. Hartnett-Rands, MS, PT Per [place] [phone] 10/06/2021

## 2021-10-06 NOTE — Progress Notes (Addendum)
PROGRESS NOTE  Nicole [name] HYW:[id number] DOB: [birth year] DOA: 10/03/2021 PCP: Jake Samples, PA-C  Brief History:  75 year old female with a history of diastolic CHF, hypertension, polymyalgia rheumatica on chronic steroids presenting with 2-day history of left lower quadrant abdominal pain with vomiting.   The patient has had 2 hospital admissions recently for abdominal pain with intractable nausea and vomiting.  She was initially admitted from 09/15/2021 to 09/11/2021 with similar symptoms.  She was diagnosed with a psoas abscess at that time.  It was felt the abscess was too small to be drained.  The patient was treated with IV antibiotics and discharged home with doxycycline.  She was admitted on 09/24/2021 to 09/29/2021 with nausea and poor oral intake and AKI.  Repeat CT of the abdomen pelvis at that time showed improvement of her psoas abscess.   Patient states that she was feeling well before the prior 3 weeks but then she was presenting with nightly episodes of nausea almost on a daily basis and regurgitation of food contents.  She has also presented with decreased appetite.  The patient also endorsed having some persistent pain in her lower abdomen, which is worsening her left lower quadrant.  Has noticed that her stools are more watery and have been more frequent than usual, up to 5 bowel movements per day with some episode of nocturnal defecation. GI was consulted to assist with management.  Assessment/Plan: Acute kidney injury -Baseline creatinine 0.9-1.1 -serum creatinine peaked 2.28 -Secondary to volume depletion -Continue IV fluids>>improving   Intractable nausea/vomiting/abdominal pain -GI consult appreciated -10/04/2021 CT abd--gas and asymmetric soft tissue adjacent to the left L4-5 disc space similar to prior CT on 09/24/2021; no bowel wall thickening.  Stable partially calcified left pelvic mass -10/21/2021 CT abd--decreased size of gas and fluid collection  adjacent to left L4-5 disc space. -12/14 EGD--normal esophagus, stomach, duodenum -Continue Protonix--increase to bid -no BM or emesis in last 48 hours -suspect a degree of gastric dysmotility, exacerbated by acute medical illness   Psoas abscess -The patient apparently completed antibiotics at the time of her discharge on 09/29/2021 -10/04/2021 CT abdomen--still shows gas and asymmetric soft tissue adjacent to the left L4-5 -Blood cultures x2 sets--neg to date -Restart antibiotics IV after cultures -12/14--MR lumbar spine--No substantial change in small left psoas abscesses/fluid collections. No evidence discitis/osteomyelitis. -ESR 75 (60) -CRP 1.8 (0.8) -given persistent abd pain, persistent radiographic evidence of abscess and increased inflammatory markers, continue IV abx   Chronic diastolic CHF -Clinically hypovolemic -07/21/2019 echo EF 65-70; no WMA, trivial pericardial effusion, normal RV   Hypokalemia/hypomagnesemia -Replete   Rectal bleeding -Anusol per GI service   Mixed hyperlipidemia -Continue statin   Morbid Obesity -BMI 44.32 -lifestyle modification      Status is: Inpatient  Remains inpatient appropriate because: severity of illness requiring IV abx      Family Communication:   son updated at bedside 12/15  Consultants:  GI  Code Status:  FULL  DVT Prophylaxis:  [place] Heparin    Procedures: As Listed in Progress Note Above  Antibiotics: Vanco 12/15>> Ceftriaxone 12/15>>       Subjective: Patient states abd pain is improving but still mild-mod, primarily in LLQ.  Denies n/v/d.  Denies f/c, cp, sob, cough, headache  Objective: Vitals:   10/05/21 1430 10/05/21 1554 10/05/21 2029 10/06/21 0428  BP: (!) 152/72 128/71 116/67 136/80  Pulse: 80 82 90 (!) 101  Resp: (!) 22 17 19  19  Temp:  97.8 F (36.6 C) 98 F (36.7 C) 98.2 F (36.8 C)  TempSrc:      SpO2: 100% 100% 94% 96%  Weight:    106.4 kg  Height:        Intake/Output  Summary (Last 24 hours) at 10/06/2021 1311 Last data filed at 10/06/2021 0900 Gross per 24 hour  Intake 2323.97 ml  Output --  Net 2323.97 ml   Weight change: 3.4 kg Exam:  General:  Pt is alert, follows commands appropriately, not in acute distress HEENT: No icterus, No thrush, No neck mass, [place]/AT Cardiovascular: RRR, S1/S2, no rubs, no gallops Respiratory: CTA bilaterally, no wheezing, no crackles, no rhonchi Abdomen: Soft/+BS, LLQ tender, non distended, no guarding Extremities: No edema, No lymphangitis, No petechiae, No rashes, no synovitis   Data Reviewed: I have personally reviewed following labs and imaging studies Basic Metabolic Panel: Recent Labs  Lab 10/03/21 1657 10/04/21 0503 10/04/21 1708 10/05/21 0556 10/06/21 0710  NA 141 141  --  138 140  K 3.3* 3.0* 3.9 3.3* 3.5  CL 99 104  --  104 107  CO2 29 22  --  23 22  GLUCOSE 106* 88  --  82 69*  BUN 27* 27*  --  23 12  CREATININE 2.23* 2.28*  --  1.68* 0.97  CALCIUM 9.1 8.3*  --  7.9* 8.2*  MG  --  1.3* 2.0  --  1.7   Liver Function Tests: Recent Labs  Lab 10/03/21 1657 10/04/21 0503  AST 30 23  ALT 25 21  ALKPHOS 98 78  BILITOT 1.0 1.0  PROT 7.1 5.8*  ALBUMIN 3.4* 2.9*   Recent Labs  Lab 10/03/21 1657  LIPASE 37   No results for input(s): AMMONIA in the last 168 hours. Coagulation Profile: Recent Labs  Lab 10/04/21 1106  INR 1.0   CBC: Recent Labs  Lab 10/03/21 1657 10/04/21 0503 10/06/21 0710  WBC 10.2 7.4 6.7  NEUTROABS  --  4.1  --   HGB 14.5 12.0 10.7*  HCT 43.9 36.9 33.0*  MCV 96.1 95.6 99.1  PLT 343 303 263   Cardiac Enzymes: No results for input(s): CKTOTAL, CKMB, CKMBINDEX, TROPONINI in the last 168 hours. BNP: Invalid input(s): POCBNP CBG: No results for input(s): GLUCAP in the last 168 hours. HbA1C: No results for input(s): HGBA1C in the last 72 hours. Urine analysis:    Component Value Date/Time   COLORURINE YELLOW 09/24/2021 1646   APPEARANCEUR CLOUDY (A)  09/24/2021 1646   LABSPEC 1.015 09/24/2021 1646   PHURINE 5.5 09/24/2021 1646   GLUCOSEU NEGATIVE 09/24/2021 1646   HGBUR NEGATIVE 09/24/2021 1646   BILIRUBINUR NEGATIVE 09/24/2021 1646   BILIRUBINUR negative 02/14/2020 1328   KETONESUR NEGATIVE 09/24/2021 1646   PROTEINUR NEGATIVE 09/24/2021 1646   UROBILINOGEN 0.2 02/14/2020 1328   UROBILINOGEN 0.2 02/20/2014 1116   NITRITE NEGATIVE 09/24/2021 1646   LEUKOCYTESUR NEGATIVE 09/24/2021 1646   Sepsis Labs: '@LABRCNTIP' (procalcitonin:4,lacticidven:4) ) Recent Results (from the past 240 hour(s))  Resp Panel by RT-PCR (Flu A&B, Covid) Nasopharyngeal Swab     Status: None   Collection Time: 10/03/21  6:50 PM   Specimen: Nasopharyngeal Swab; Nasopharyngeal(NP) swabs in vial transport medium  Result Value Ref Range Status   SARS Coronavirus 2 by RT PCR NEGATIVE NEGATIVE Final    Comment: (NOTE) SARS-CoV-2 target nucleic acids are NOT DETECTED.  The SARS-CoV-2 RNA is generally detectable in upper respiratory specimens during the acute phase of infection. The lowest  concentration of SARS-CoV-2 viral copies this assay can detect is 138 copies/mL. A negative result does not preclude SARS-Cov-2 infection and should not be used as the sole basis for treatment or other patient management decisions. A negative result may occur with  improper specimen collection/handling, submission of specimen other than nasopharyngeal swab, presence of viral mutation(s) within the areas targeted by this assay, and inadequate number of viral copies(<138 copies/mL). A negative result must be combined with clinical observations, patient history, and epidemiological information. The expected result is Negative.  Fact Sheet for Patients:  EntrepreneurPulse.com.au  Fact Sheet for Healthcare Providers:  IncredibleEmployment.be  This test is no t yet approved or cleared by the [place] FDA and  has been authorized for  detection and/or diagnosis of SARS-CoV-2 by FDA under an Emergency Use Authorization (EUA). This EUA will remain  in effect (meaning this test can be used) for the duration of the COVID-19 declaration under Section 564(b)(1) of the Act, 21 U.S.C.section 360bbb-3(b)(1), unless the authorization is terminated  or revoked sooner.       Influenza A by PCR NEGATIVE NEGATIVE Final   Influenza B by PCR NEGATIVE NEGATIVE Final    Comment: (NOTE) The Xpert Xpress SARS-CoV-2/FLU/RSV plus assay is intended as an aid in the diagnosis of influenza from Nasopharyngeal swab specimens and should not be used as a sole basis for treatment. Nasal washings and aspirates are unacceptable for Xpert Xpress SARS-CoV-2/FLU/RSV testing.  Fact Sheet for Patients: EntrepreneurPulse.com.au  Fact Sheet for Healthcare Providers: IncredibleEmployment.be  This test is not yet approved or cleared by the [place] FDA and has been authorized for detection and/or diagnosis of SARS-CoV-2 by FDA under an Emergency Use Authorization (EUA). This EUA will remain in effect (meaning this test can be used) for the duration of the COVID-19 declaration under Section 564(b)(1) of the Act, 21 U.S.C. section 360bbb-3(b)(1), unless the authorization is terminated or revoked.  Performed at [facility], [address], [place], [place] [address]   Culture, blood (routine x 2)     Status: None (Preliminary result)   Collection Time: 10/05/21  8:55 AM   Specimen: BLOOD RIGHT FOREARM  Result Value Ref Range Status   Specimen Description BLOOD RIGHT FOREARM  Final   Special Requests   Final    BOTTLES DRAWN AEROBIC AND ANAEROBIC Blood Culture adequate volume Performed at [facility], [address] [place], [place] [address]    Culture PENDING  Incomplete   Report Status PENDING  Incomplete  Culture, blood (routine x 2)     Status: None (Preliminary result)   Collection Time: 10/05/21   8:55 AM   Specimen: BLOOD LEFT FOREARM  Result Value Ref Range Status   Specimen Description BLOOD LEFT FOREARM  Final   Special Requests   Final    BOTTLES DRAWN AEROBIC AND ANAEROBIC Blood Culture adequate volume Performed at [facility], [address], [place], [place] [address]    Culture PENDING  Incomplete   Report Status PENDING  Incomplete     Scheduled Meds:  atorvastatin  20 mg Oral Daily   calcitRIOL  0.25 mcg Oral Once per day on Mon Wed Fri   heparin  5,000 Units Subcutaneous Q8H   hydrocortisone  1 application Rectal BID   melatonin  6 mg Oral QHS   metoprolol succinate  25 mg Oral Daily   nystatin   Topical BID   ondansetron  4 mg Oral TID AC & HS   pantoprazole  40 mg  Oral Daily   traZODone  25 mg Oral QHS   Continuous Infusions:  sodium chloride 75 mL/hr at 10/05/21 0206   cefTRIAXone (ROCEPHIN)  IV 2 g (10/06/21 1020)   promethazine (PHENERGAN) injection (IM or IVPB)     vancomycin 2,000 mg (10/06/21 1214)    Procedures/Studies: CT ABDOMEN PELVIS WO CONTRAST  Result Date: 10/03/2021 CLINICAL DATA:  Left lower quadrant abdominal pain EXAM: CT ABDOMEN AND PELVIS WITHOUT CONTRAST TECHNIQUE: Multidetector CT imaging of the abdomen and pelvis was performed following the standard protocol without IV contrast. COMPARISON:  CT abdomen and pelvis dated September 24, 2021 FINDINGS: Lower chest: Bibasilar atelectasis.  Coronary artery calcifications. Hepatobiliary: Mildly nodular liver contour. No focal liver abnormality is seen. Status post cholecystectomy. No biliary dilatation. Pancreas: Unremarkable. No pancreatic ductal dilatation or surrounding inflammatory changes. Spleen: Normal in size without focal abnormality. Adrenals/Urinary Tract: Adrenal glands are unremarkable. Kidneys are normal, without renal calculi, focal lesion, or hydronephrosis. Bladder is unremarkable. Stomach/Bowel: Stomach is within normal limits. Appendix appears normal. Diverticulosis. No evidence  of bowel wall thickening, distention, or inflammatory changes. Vascular/Lymphatic: Aortic atherosclerosis. No enlarged abdominal or pelvic lymph nodes. Reproductive: Stable partially calcified left pelvic mass. Other: Small fat containing umbilical hernia. No abdominopelvic ascites. Musculoskeletal: Gas and asymmetric soft tissue adjacent to the left L4-L5 disc space is similar in appearance when compared with December 3rd 2022 prior. IMPRESSION: 1. No acute findings in the abdomen or pelvis. 2. Gas and asymmetric soft tissue adjacent to the left L4-L5 disc space is similar in appearance when compared with December 3rd 2022 prior. 3. Stable calcified left pelvic mass. 4. Coronary artery calcifications and aortic Atherosclerosis (ICD10-I70.0). Electronically Signed   By: Yetta Glassman M.D.   On: 10/03/2021 20:36   CT ABDOMEN PELVIS WO CONTRAST  Result Date: 09/24/2021 CLINICAL DATA:  Abdominal abscess. EXAM: CT ABDOMEN AND PELVIS WITHOUT CONTRAST TECHNIQUE: Multidetector CT imaging of the abdomen and pelvis was performed following the standard protocol without IV contrast. COMPARISON:  September 15, 2021 FINDINGS: Lower chest: Atelectasis versus scarring in the left lung base. Calcific atherosclerotic disease of the aorta and coronary arteries. Hepatobiliary: No focal liver abnormality is seen. Status post cholecystectomy. No biliary dilatation. Pancreas: Unremarkable. No pancreatic ductal dilatation or surrounding inflammatory changes. Spleen: Normal in size without focal abnormality. Adrenals/Urinary Tract: Adrenal glands are unremarkable. Kidneys are normal, without renal calculi, focal lesion, or hydronephrosis. Bladder is unremarkable. Stomach/Bowel: Stomach is within normal limits. Appendix appears normal. No evidence of bowel wall thickening, distention, or inflammatory changes. Vascular/Lymphatic: Aortic atherosclerosis. No enlarged abdominal or pelvic lymph nodes. Reproductive: Stable partially  calcified left pelvic mass. Normal appearance of the right ovary and uterus. Other: No abdominal wall hernia or abnormality. No abdominopelvic ascites. Musculoskeletal: Marked arthritic changes of the lumbosacral spine. Scoliosis. Prior lumbar spine decompensation. Decreased in size gas and fluid collection adjacent to left L4-L5 disc space and extending into the left psoas muscle. Prominent left-sided L4-L5 disc bulge. IMPRESSION: 1. Decreased in size known gas and fluid collection adjacent to left L4-L5 disc space, extending into the left psoas muscle. 2. Stable partially calcified left pelvic mass. 3. Calcific atherosclerotic disease of the aorta and coronary arteries. Aortic Atherosclerosis (ICD10-I70.0). Electronically Signed   By: Fidela Salisbury M.D.   On: 09/24/2021 18:13   MR LUMBAR SPINE WO CONTRAST  Result Date: 09/16/2021 CLINICAL DATA:  Back pain common gas seen in psoas muscle on recent CT EXAM: MRI LUMBAR SPINE WITHOUT CONTRAST TECHNIQUE: Multiplanar, multisequence MR imaging  of the lumbar spine was performed. No intravenous contrast was administered. COMPARISON:  CT abdomen/pelvis dated 1 day prior, lumbar spine MRI 06/21/2021 FINDINGS: Segmentation: There is transitional anatomy with lumbarization of the S1 vertebral body. For the purposes of this report, there is a rudimentary S1-S2 disc space. Alignment:  There is trace retrolisthesis of L1 on L2 and L2 on L3. Vertebrae: There are postsurgical changes reflecting posterior decompression from L2 through L5. Vertebral body heights are preserved. There is multilevel degenerative endplate marrow signal abnormality throughout the lumbar spine. There is no suspicious marrow signal abnormality. There is no marrow edema to suggest infection. There is no definite evidence of septic arthritis. Conus medullaris and cauda equina: Conus extends to the L1-L2 level. Conus and cauda equina appear normal. Paraspinal and other soft tissues: There is a 2.0  cm x 0.8 cm by 1.5 cm fluid collection in the left psoas muscle at the L5 level. There may be an additional smaller abscess measuring up to 0.8 cm more superiorly at the L4 level (7-15, 11-18). The paraspinal soft tissues are otherwise unremarkable. Disc levels: There is multilevel intervertebral disc space narrowing. Marked T2 hypointensity in the anterior aspect of the L3-L4 and L4-L5 disc spaces is consistent with vacuum disc phenomenon. There is advanced multilevel facet arthropathy with prominent effusions at L4-L5 and L5-S1, similar to the prior study. T12-L1: Is a mild disc bulge and ligamentum flavum thickening resulting in mild spinal canal stenosis and mild-to-moderate bilateral neural foraminal stenosis. L1-L2: Status post posterior decompression. There is a prominent diffuse disc bulge, degenerative endplate change, and bilateral facet arthropathy resulting in mild spinal canal stenosis and severe bilateral neural foraminal stenosis, unchanged. L2-L3: Status post posterior decompression. There is a mild disc bulge, degenerative endplate change, and bilateral facet arthropathy resulting in severe bilateral neural foraminal stenosis without significant spinal canal stenosis, unchanged. L3-L4: Status post posterior decompression. There is a prominent diffuse disc bulge, degenerative endplate change, and bilateral facet arthropathy resulting in severe bilateral neural foraminal stenosis without significant spinal canal stenosis, unchanged. L4-L5: Status post posterior decompression. There is a prominent diffuse disc bulge, degenerative endplate change, and bilateral facet arthropathy resulting in mild spinal canal stenosis and severe bilateral neural foraminal stenosis, unchanged. L5-S1: There is a diffuse disc bulge with a left subarticular zone annular fissure, degenerative endplate change, and bilateral facet arthropathy resulting in severe left worse than right neural foraminal stenosis without  significant spinal canal stenosis, unchanged. IMPRESSION: 1. Postcontrast images were not obtained as the patient could not complete the study. Within this confines: 2. 2.0 cm fluid collection in the left psoas muscle at the L5 level concerning for abscess with a possible additional smaller abscess at the L4 level. 3. No abnormal marrow edema to suggest adjacent discitis/osteomyelitis, though as above, postcontrast images were not obtained. 4. Severe multilevel degenerative changes as above, not significantly changed since 06/21/2021. Electronically Signed   By: Valetta Mole M.D.   On: 09/16/2021 14:07   MR Lumbar Spine W Wo Contrast  Result Date: 10/05/2021 CLINICAL DATA:  Low back pain, infection suspected, positive xray/CT psoas abscess, continued pain EXAM: MRI LUMBAR SPINE WITHOUT AND WITH CONTRAST TECHNIQUE: Multiplanar and multiecho pulse sequences of the lumbar spine were obtained without and with intravenous contrast. CONTRAST:  76m GADAVIST GADOBUTROL 1 MMOL/ML IV SOLN COMPARISON:  09/16/2021 FINDINGS: Segmentation: Numbering is kept consistent with the prior study with transitional anatomy at the lumbosacral junction and partial lumbarization of S1. Alignment:  Stable. Vertebrae: Stable vertebral  body heights. Postsurgical changes of posterior decompression are again identified from L1-L2 through L5-S1. There is no new marrow edema. Conus medullaris and cauda equina: Conus extends to the L1-L2 level. Conus and cauda equina appear normal. No abnormal intrathecal enhancement. Paraspinal and other soft tissues: Postoperative changes. Left paraspinal edema and small peripherally enhancing collection identified within the psoas muscle at the L4 level. A second small peripherally enhancing collection is present in the left psoas muscle at the L5 level. Do not appear substantially changed the prior study. Disc levels: Similar disc edema at several levels. There is no associated enhancement. Degenerative  changes are stable over the short interval. IMPRESSION: No substantial change in small left psoas abscesses/fluid collections. No evidence discitis/osteomyelitis. Electronically Signed   By: Macy Mis M.D.   On: 10/05/2021 17:55   MR PELVIS WO CONTRAST  Result Date: 09/16/2021 CLINICAL DATA:  Intramuscular gas within the left iliopsoas muscle on CT. Severe back pain. EXAM: MRI PELVIS WITHOUT CONTRAST TECHNIQUE: Multiplanar multisequence MR imaging of the pelvis was performed. No intravenous contrast was administered. Patient was unable to continue the examination. No contrast was administered. Lumbar spine findings are dictated separately. COMPARISON:  Abdominopelvic CT 09/15/2021 and 06/21/2021. FINDINGS: Bones/Joint/Cartilage Within the pelvis, there is no evidence of acute fracture or dislocation. There are mild degenerative changes of both hips. No evidence of avascular necrosis or significant hip joint effusion. The sacroiliac joints are intact with mild degenerative changes on the left. There is no sacroiliac joint effusion or suspicious subchondral signal abnormality. There is multilevel lumbar spondylosis status post multilevel posterior decompression. Lumbar spine findings are dictated separately. Ligaments Not relevant for exam/indication. Muscles and Tendons Associated with the gas in the left iliopsoas muscle on recent CT is a small amount of muscular edema as well as a small fluid collection measuring up to 1.8 cm posterior to the muscle at the L5 level. No drainable fluid collection identified. Muscular edema extends into the pelvis without associated focal fluid collection. There is no significant fluid in the iliopsoas bursa. There is asymmetric fatty atrophy of the left gluteus, piriformis and hamstring muscles. No acute tendon tear identified. Soft tissues There is a chronic left adnexal mass measuring 3.7 cm on image 29/7. This demonstrates low T1 and T2 signal, corresponding with  calcification on CT. This mass appears grossly unchanged from CTs dating back to 08/20/2013. As above, left paraspinal inflammatory changes and gas within the ileus psoas muscle. No other focal fluid collections are identified. IMPRESSION: 1. There is a small amount of muscular edema within the left iliopsoas muscle, associated with a small paraspinal fluid collection at the L5 level. See separate examination of the lumbar spine. 2. No intrapelvic fluid collections. 3. Mild degenerative changes of the hips and sacroiliac joints. 4. Stable partially calcified left adnexal mass from at least 2014, consistent with a benign finding. Electronically Signed   By: Richardean Sale M.D.   On: 09/16/2021 13:50   CT Abdomen Pelvis W Contrast  Result Date: 09/15/2021 CLINICAL DATA:  75 year old female with acute LEFT abdominal and pelvic pain for 2 days. EXAM: CT ABDOMEN AND PELVIS WITH CONTRAST TECHNIQUE: Multidetector CT imaging of the abdomen and pelvis was performed using the standard protocol following bolus administration of intravenous contrast. CONTRAST:  31m OMNIPAQUE IOHEXOL 300 MG/ML  SOLN COMPARISON:  06/21/2021 CT and prior studies FINDINGS: Lower chest: No acute abnormality Hepatobiliary: Mildly nodular hepatic contour or likely represent cirrhosis. No other hepatic abnormalities are noted. Patient is status  post cholecystectomy. No biliary dilatation. Pancreas: Unremarkable Spleen: Unremarkable Adrenals/Urinary Tract: The kidneys, adrenal glands and bladder are unremarkable. No hydronephrosis or renal calculi. Stomach/Bowel: Scattered diverticula within the descending and sigmoid colon noted without evidence of acute diverticulitis. There is no evidence of bowel obstruction or focal collection. The appendix is normal. Vascular/Lymphatic: Aortic atherosclerosis. No enlarged abdominal or pelvic lymph nodes. Reproductive: No new abnormalities. A 4 cm LEFT adnexal mass appears unchanged from remote studies.  Other: No ascites or pneumoperitoneum. Musculoskeletal: Again noted is moderate to severe multilevel degenerative disc disease, spondylosis and facet arthropathy throughout the lumbar spine with vacuum disc phenomenon. However, now noted are foci of gas within the LEFT psoas muscle which appears to extend from the L3-4 or L4-5 vacuum discs. No definite LEFT psoas focal collection or enhancement is identified. IMPRESSION: 1. New foci of gas within the LEFT psoas muscle which appears to extend from L3-4 or L4-5 degenerative vacuum discs. Although this may represent noninfectious extension of gas from the disc space, infection is not excluded although there is no definite evidence of focal collection/enhancement of the LEFT psoas muscle. Recommend MRI with and without contrast for further evaluation as clinically indicated. 2. Mildly nodular hepatic contour suspicious for cirrhosis. 3. Aortic Atherosclerosis (ICD10-I70.0). Electronically Signed   By: Margarette Canada M.D.   On: 09/15/2021 12:46   DG CHEST PORT 1 VIEW  Result Date: 10/03/2021 CLINICAL DATA:  Abdominal pain, nausea, vomiting and diarrhea. EXAM: PORTABLE CHEST 1 VIEW COMPARISON:  Portable chest 07/20/2021 FINDINGS: The heart enlarged but unchanged. No vascular congestion is seen. The aorta is tortuous patchy calcification and mild ectasia. Linear scarring or atelectasis again noted left mid field. The lungs are otherwise clear. No pleural effusion is seen. Advanced bilateral shoulder DJD. IMPRESSION: No evidence of acute chest disease or interval changes. Cardiomegaly. Stable chest. Electronically Signed   By: Telford Nab M.D.   On: 10/03/2021 20:32   US Abdomen Limited RUQ (LIVER/GB)  Result Date: 09/16/2021 CLINICAL DATA:  Abdomen pain EXAM: ULTRASOUND ABDOMEN LIMITED RIGHT UPPER QUADRANT COMPARISON:  CT 09/15/2021. FINDINGS: Gallbladder: Status post cholecystectomy. Common bile duct: Diameter: 1.8 mm Liver: Slightly nodular hepatic contour. No  focal hepatic abnormality. Liver is slightly echogenic. Portal vein is patent on color Doppler imaging with normal direction of blood flow towards the liver. Other: None. IMPRESSION: 1. Status post cholecystectomy without biliary dilatation 2. Slightly echogenic nodular liver suspicious for cirrhosis Electronically Signed   By: Donavan Foil M.D.   On: 09/16/2021 19:53    Orson Eva, DO  [facility]  If 7PM-7AM, please contact night-coverage [email or web address] Password [email or web address] 10/06/2021, 1:11 PM   LOS: 3 days

## 2021-10-06 NOTE — Progress Notes (Signed)
Pharmacy Antibiotic Note  Nicole [name] is a 75 y.o. female admitted on 10/03/2021 with  wound infection .  Pharmacy has been consulted for vancomycin dosing.  Plan: Vancomycin 2000 mg IV x 1 dose. Vancomycin 1250 mg IV every 24 hours. Monitor labs, c/s, and vanco level as indicated.  Height: 5\' 1"  (154.9 cm) Weight: 106.4 kg (234 lb 9.1 oz) IBW/kg (Calculated) : 47.8  Temp (24hrs), Avg:98.1 F (36.7 C), Min:97.8 F (36.6 C), Max:98.3 F (36.8 C)  Recent Labs  Lab 10/03/21 1657 10/03/21 1828 10/03/21 2059 10/04/21 0503 10/05/21 0556 10/06/21 0710  WBC 10.2  --   --  7.4  --  6.7  CREATININE 2.23*  --   --  2.28* 1.68* 0.97  LATICACIDVEN  --  1.7 1.6  --   --   --     Estimated Creatinine Clearance: 56.3 mL/min (by C-G formula based on SCr of 0.97 mg/dL).    Allergies  Allergen Reactions   Oxycontin [Oxycodone Hcl] Swelling    Pt tolerates hydromorphone.   Sulfa Antibiotics Other (See Comments)    Sores   Tramadol     "Makes me feel like I am falling"   Penicillins Rash   Sulfasalazine Other (See Comments)    Sores    Antimicrobials this admission: Vanco 12/15 >> CTX 12/15 >>  Microbiology results: 12/15 BCx: pending 12/15 Cdiff: pending  Thank you for allowing pharmacy to be a part of this patients care.  Ramond Craver 10/06/2021 1:14 PM

## 2021-10-06 NOTE — Progress Notes (Signed)
Peripherally Inserted Central Catheter Placement  The IV Nurse has discussed with the patient and/or persons authorized to consent for the patient, the purpose of this procedure and the potential benefits and risks involved with this procedure.  The benefits include less needle sticks, lab draws from the catheter, and the patient may be discharged home with the catheter. Risks include, but not limited to, infection, bleeding, blood clot (thrombus formation), and puncture of an artery; nerve damage and irregular heartbeat and possibility to perform a PICC exchange if needed/ordered by physician.  Alternatives to this procedure were also discussed.  Bard Power PICC patient education guide, fact sheet on infection prevention and patient information card has been provided to patient /or left at bedside.    PICC Placement Documentation  PICC Single Lumen 41/74/08 Left Cephalic 48 cm 0 cm (Active)  Indication for Insertion or Continuance of Line Prolonged intravenous therapies;Home intravenous therapies (PICC only) 10/06/21 1716  Exposed Catheter (cm) 0 cm 10/06/21 1716  Site Assessment Clean;Dry;Intact 10/06/21 1716  Line Status Flushed;Saline locked;Blood return noted 10/06/21 1716  Dressing Type Transparent;Securing device 10/06/21 1716  Dressing Status Clean;Dry;Intact 10/06/21 1716  Antimicrobial disc in place? Yes 10/06/21 [place] Not Applicable 14/48/18 5631  Line Care Connections checked and tightened 10/06/21 1716  Dressing Intervention New dressing 10/06/21 1716  Dressing Change Due 10/13/21 10/06/21 [place] Marylon Verno 10/06/2021, 5:17 PM

## 2021-10-06 NOTE — Progress Notes (Addendum)
Subjective:  Continues to complain of nausea. Smells make her sick. When she tries to chew her food, feels like she chews forever and does not produce enough saliva to help get the food down. Feels like stuff gurgling up her esophagus and going to come up. States she had a soft stool today, could not be collected due to contamination by urine. Still with some LLQ pain. Worse with heaving and movement.   Objective: Vital signs in last 24 hours: Temp:  [97.8 F (36.6 C)-98.3 F (36.8 C)] 98.2 F (36.8 C) (12/15 0428) Pulse Rate:  [80-101] 101 (12/15 0428) Resp:  [17-22] 19 (12/15 0428) BP: (116-152)/(67-96) 136/80 (12/15 0428) SpO2:  [94 %-100 %] 96 % (12/15 0428) Weight:  [106.4 kg] 106.4 kg (12/15 0428) Last BM Date: 10/05/21 General:   Alert,  Well-developed, well-nourished, pleasant and cooperative in NAD Head:  Normocephalic and atraumatic. Eyes:  Sclera clear, no icterus.  Abdomen:  Soft, obese, llq tenderness Normal bowel sounds, without guarding, and without rebound.   Extremities:  Without clubbing, deformity or edema. Neurologic:  Alert and  oriented x4;  grossly normal neurologically. Skin:  Intact without significant lesions or rashes. Psych:  Alert and cooperative. Normal mood and affect.  Intake/Output from previous day: 12/14 0701 - 12/15 0700 In: 2204 [P.O.:420; I.V.:1563.4; IV Piggyback:220.6] Out: -  Intake/Output this shift: Total I/O In: 120 [P.O.:120] Out: -   Lab Results: CBC Recent Labs    10/03/21 1657 10/04/21 0503 10/06/21 0710  WBC 10.2 7.4 6.7  HGB 14.5 12.0 10.7*  HCT 43.9 36.9 33.0*  MCV 96.1 95.6 99.1  PLT 343 303 263   BMET Recent Labs    10/04/21 0503 10/04/21 1708 10/05/21 0556 10/06/21 0710  NA 141  --  138 140  K 3.0* 3.9 3.3* 3.5  CL 104  --  104 107  CO2 22  --  23 22  GLUCOSE 88  --  82 69*  BUN 27*  --  23 12  CREATININE 2.28*  --  1.68* 0.97  CALCIUM 8.3*  --  7.9* 8.2*   LFTs Recent Labs    10/03/21 1657  10/04/21 0503  BILITOT 1.0 1.0  ALKPHOS 98 78  AST 30 23  ALT 25 21  PROT 7.1 5.8*  ALBUMIN 3.4* 2.9*   Recent Labs    10/03/21 1657  LIPASE 37   PT/INR Recent Labs    10/04/21 1106  LABPROT 13.6  INR 1.0      Imaging Studies: CT ABDOMEN PELVIS WO CONTRAST  Result Date: 10/03/2021 CLINICAL DATA:  Left lower quadrant abdominal pain EXAM: CT ABDOMEN AND PELVIS WITHOUT CONTRAST TECHNIQUE: Multidetector CT imaging of the abdomen and pelvis was performed following the standard protocol without IV contrast. COMPARISON:  CT abdomen and pelvis dated September 24, 2021 FINDINGS: Lower chest: Bibasilar atelectasis.  Coronary artery calcifications. Hepatobiliary: Mildly nodular liver contour. No focal liver abnormality is seen. Status post cholecystectomy. No biliary dilatation. Pancreas: Unremarkable. No pancreatic ductal dilatation or surrounding inflammatory changes. Spleen: Normal in size without focal abnormality. Adrenals/Urinary Tract: Adrenal glands are unremarkable. Kidneys are normal, without renal calculi, focal lesion, or hydronephrosis. Bladder is unremarkable. Stomach/Bowel: Stomach is within normal limits. Appendix appears normal. Diverticulosis. No evidence of bowel wall thickening, distention, or inflammatory changes. Vascular/Lymphatic: Aortic atherosclerosis. No enlarged abdominal or pelvic lymph nodes. Reproductive: Stable partially calcified left pelvic mass. Other: Small fat containing umbilical hernia. No abdominopelvic ascites. Musculoskeletal: Gas and asymmetric soft tissue adjacent to the  left L4-L5 disc space is similar in appearance when compared with December 3rd 2022 prior. IMPRESSION: 1. No acute findings in the abdomen or pelvis. 2. Gas and asymmetric soft tissue adjacent to the left L4-L5 disc space is similar in appearance when compared with December 3rd 2022 prior. 3. Stable calcified left pelvic mass. 4. Coronary artery calcifications and aortic Atherosclerosis  (ICD10-I70.0). Electronically Signed   By: Yetta Glassman M.D.   On: 10/03/2021 20:36   CT ABDOMEN PELVIS WO CONTRAST  Result Date: 09/24/2021 CLINICAL DATA:  Abdominal abscess. EXAM: CT ABDOMEN AND PELVIS WITHOUT CONTRAST TECHNIQUE: Multidetector CT imaging of the abdomen and pelvis was performed following the standard protocol without IV contrast. COMPARISON:  September 15, 2021 FINDINGS: Lower chest: Atelectasis versus scarring in the left lung base. Calcific atherosclerotic disease of the aorta and coronary arteries. Hepatobiliary: No focal liver abnormality is seen. Status post cholecystectomy. No biliary dilatation. Pancreas: Unremarkable. No pancreatic ductal dilatation or surrounding inflammatory changes. Spleen: Normal in size without focal abnormality. Adrenals/Urinary Tract: Adrenal glands are unremarkable. Kidneys are normal, without renal calculi, focal lesion, or hydronephrosis. Bladder is unremarkable. Stomach/Bowel: Stomach is within normal limits. Appendix appears normal. No evidence of bowel wall thickening, distention, or inflammatory changes. Vascular/Lymphatic: Aortic atherosclerosis. No enlarged abdominal or pelvic lymph nodes. Reproductive: Stable partially calcified left pelvic mass. Normal appearance of the right ovary and uterus. Other: No abdominal wall hernia or abnormality. No abdominopelvic ascites. Musculoskeletal: Marked arthritic changes of the lumbosacral spine. Scoliosis. Prior lumbar spine decompensation. Decreased in size gas and fluid collection adjacent to left L4-L5 disc space and extending into the left psoas muscle. Prominent left-sided L4-L5 disc bulge. IMPRESSION: 1. Decreased in size known gas and fluid collection adjacent to left L4-L5 disc space, extending into the left psoas muscle. 2. Stable partially calcified left pelvic mass. 3. Calcific atherosclerotic disease of the aorta and coronary arteries. Aortic Atherosclerosis (ICD10-I70.0). Electronically Signed    By: Fidela Salisbury M.D.   On: 09/24/2021 18:13   MR LUMBAR SPINE WO CONTRAST  Result Date: 09/16/2021 CLINICAL DATA:  Back pain common gas seen in psoas muscle on recent CT EXAM: MRI LUMBAR SPINE WITHOUT CONTRAST TECHNIQUE: Multiplanar, multisequence MR imaging of the lumbar spine was performed. No intravenous contrast was administered. COMPARISON:  CT abdomen/pelvis dated 1 day prior, lumbar spine MRI 06/21/2021 FINDINGS: Segmentation: There is transitional anatomy with lumbarization of the S1 vertebral body. For the purposes of this report, there is a rudimentary S1-S2 disc space. Alignment:  There is trace retrolisthesis of L1 on L2 and L2 on L3. Vertebrae: There are postsurgical changes reflecting posterior decompression from L2 through L5. Vertebral body heights are preserved. There is multilevel degenerative endplate marrow signal abnormality throughout the lumbar spine. There is no suspicious marrow signal abnormality. There is no marrow edema to suggest infection. There is no definite evidence of septic arthritis. Conus medullaris and cauda equina: Conus extends to the L1-L2 level. Conus and cauda equina appear normal. Paraspinal and other soft tissues: There is a 2.0 cm x 0.8 cm by 1.5 cm fluid collection in the left psoas muscle at the L5 level. There may be an additional smaller abscess measuring up to 0.8 cm more superiorly at the L4 level (7-15, 11-18). The paraspinal soft tissues are otherwise unremarkable. Disc levels: There is multilevel intervertebral disc space narrowing. Marked T2 hypointensity in the anterior aspect of the L3-L4 and L4-L5 disc spaces is consistent with vacuum disc phenomenon. There is advanced multilevel facet arthropathy with  prominent effusions at L4-L5 and L5-S1, similar to the prior study. T12-L1: Is a mild disc bulge and ligamentum flavum thickening resulting in mild spinal canal stenosis and mild-to-moderate bilateral neural foraminal stenosis. L1-L2: Status post  posterior decompression. There is a prominent diffuse disc bulge, degenerative endplate change, and bilateral facet arthropathy resulting in mild spinal canal stenosis and severe bilateral neural foraminal stenosis, unchanged. L2-L3: Status post posterior decompression. There is a mild disc bulge, degenerative endplate change, and bilateral facet arthropathy resulting in severe bilateral neural foraminal stenosis without significant spinal canal stenosis, unchanged. L3-L4: Status post posterior decompression. There is a prominent diffuse disc bulge, degenerative endplate change, and bilateral facet arthropathy resulting in severe bilateral neural foraminal stenosis without significant spinal canal stenosis, unchanged. L4-L5: Status post posterior decompression. There is a prominent diffuse disc bulge, degenerative endplate change, and bilateral facet arthropathy resulting in mild spinal canal stenosis and severe bilateral neural foraminal stenosis, unchanged. L5-S1: There is a diffuse disc bulge with a left subarticular zone annular fissure, degenerative endplate change, and bilateral facet arthropathy resulting in severe left worse than right neural foraminal stenosis without significant spinal canal stenosis, unchanged. IMPRESSION: 1. Postcontrast images were not obtained as the patient could not complete the study. Within this confines: 2. 2.0 cm fluid collection in the left psoas muscle at the L5 level concerning for abscess with a possible additional smaller abscess at the L4 level. 3. No abnormal marrow edema to suggest adjacent discitis/osteomyelitis, though as above, postcontrast images were not obtained. 4. Severe multilevel degenerative changes as above, not significantly changed since 06/21/2021. Electronically Signed   By: Valetta Mole M.D.   On: 09/16/2021 14:07   MR Lumbar Spine W Wo Contrast  Result Date: 10/05/2021 CLINICAL DATA:  Low back pain, infection suspected, positive xray/CT psoas  abscess, continued pain EXAM: MRI LUMBAR SPINE WITHOUT AND WITH CONTRAST TECHNIQUE: Multiplanar and multiecho pulse sequences of the lumbar spine were obtained without and with intravenous contrast. CONTRAST:  39mL GADAVIST GADOBUTROL 1 MMOL/ML IV SOLN COMPARISON:  09/16/2021 FINDINGS: Segmentation: Numbering is kept consistent with the prior study with transitional anatomy at the lumbosacral junction and partial lumbarization of S1. Alignment:  Stable. Vertebrae: Stable vertebral body heights. Postsurgical changes of posterior decompression are again identified from L1-L2 through L5-S1. There is no new marrow edema. Conus medullaris and cauda equina: Conus extends to the L1-L2 level. Conus and cauda equina appear normal. No abnormal intrathecal enhancement. Paraspinal and other soft tissues: Postoperative changes. Left paraspinal edema and small peripherally enhancing collection identified within the psoas muscle at the L4 level. A second small peripherally enhancing collection is present in the left psoas muscle at the L5 level. Do not appear substantially changed the prior study. Disc levels: Similar disc edema at several levels. There is no associated enhancement. Degenerative changes are stable over the short interval. IMPRESSION: No substantial change in small left psoas abscesses/fluid collections. No evidence discitis/osteomyelitis. Electronically Signed   By: Macy Mis M.D.   On: 10/05/2021 17:55   MR PELVIS WO CONTRAST  Result Date: 09/16/2021 CLINICAL DATA:  Intramuscular gas within the left iliopsoas muscle on CT. Severe back pain. EXAM: MRI PELVIS WITHOUT CONTRAST TECHNIQUE: Multiplanar multisequence MR imaging of the pelvis was performed. No intravenous contrast was administered. Patient was unable to continue the examination. No contrast was administered. Lumbar spine findings are dictated separately. COMPARISON:  Abdominopelvic CT 09/15/2021 and 06/21/2021. FINDINGS: Bones/Joint/Cartilage  Within the pelvis, there is no evidence of acute fracture or dislocation.  There are mild degenerative changes of both hips. No evidence of avascular necrosis or significant hip joint effusion. The sacroiliac joints are intact with mild degenerative changes on the left. There is no sacroiliac joint effusion or suspicious subchondral signal abnormality. There is multilevel lumbar spondylosis status post multilevel posterior decompression. Lumbar spine findings are dictated separately. Ligaments Not relevant for exam/indication. Muscles and Tendons Associated with the gas in the left iliopsoas muscle on recent CT is a small amount of muscular edema as well as a small fluid collection measuring up to 1.8 cm posterior to the muscle at the L5 level. No drainable fluid collection identified. Muscular edema extends into the pelvis without associated focal fluid collection. There is no significant fluid in the iliopsoas bursa. There is asymmetric fatty atrophy of the left gluteus, piriformis and hamstring muscles. No acute tendon tear identified. Soft tissues There is a chronic left adnexal mass measuring 3.7 cm on image 29/7. This demonstrates low T1 and T2 signal, corresponding with calcification on CT. This mass appears grossly unchanged from CTs dating back to 08/20/2013. As above, left paraspinal inflammatory changes and gas within the ileus psoas muscle. No other focal fluid collections are identified. IMPRESSION: 1. There is a small amount of muscular edema within the left iliopsoas muscle, associated with a small paraspinal fluid collection at the L5 level. See separate examination of the lumbar spine. 2. No intrapelvic fluid collections. 3. Mild degenerative changes of the hips and sacroiliac joints. 4. Stable partially calcified left adnexal mass from at least 2014, consistent with a benign finding. Electronically Signed   By: Richardean Sale M.D.   On: 09/16/2021 13:50   CT Abdomen Pelvis W Contrast  Result  Date: 09/15/2021 CLINICAL DATA:  75 year old female with acute LEFT abdominal and pelvic pain for 2 days. EXAM: CT ABDOMEN AND PELVIS WITH CONTRAST TECHNIQUE: Multidetector CT imaging of the abdomen and pelvis was performed using the standard protocol following bolus administration of intravenous contrast. CONTRAST:  59mL OMNIPAQUE IOHEXOL 300 MG/ML  SOLN COMPARISON:  06/21/2021 CT and prior studies FINDINGS: Lower chest: No acute abnormality Hepatobiliary: Mildly nodular hepatic contour or likely represent cirrhosis. No other hepatic abnormalities are noted. Patient is status post cholecystectomy. No biliary dilatation. Pancreas: Unremarkable Spleen: Unremarkable Adrenals/Urinary Tract: The kidneys, adrenal glands and bladder are unremarkable. No hydronephrosis or renal calculi. Stomach/Bowel: Scattered diverticula within the descending and sigmoid colon noted without evidence of acute diverticulitis. There is no evidence of bowel obstruction or focal collection. The appendix is normal. Vascular/Lymphatic: Aortic atherosclerosis. No enlarged abdominal or pelvic lymph nodes. Reproductive: No new abnormalities. A 4 cm LEFT adnexal mass appears unchanged from remote studies. Other: No ascites or pneumoperitoneum. Musculoskeletal: Again noted is moderate to severe multilevel degenerative disc disease, spondylosis and facet arthropathy throughout the lumbar spine with vacuum disc phenomenon. However, now noted are foci of gas within the LEFT psoas muscle which appears to extend from the L3-4 or L4-5 vacuum discs. No definite LEFT psoas focal collection or enhancement is identified. IMPRESSION: 1. New foci of gas within the LEFT psoas muscle which appears to extend from L3-4 or L4-5 degenerative vacuum discs. Although this may represent noninfectious extension of gas from the disc space, infection is not excluded although there is no definite evidence of focal collection/enhancement of the LEFT psoas muscle. Recommend  MRI with and without contrast for further evaluation as clinically indicated. 2. Mildly nodular hepatic contour suspicious for cirrhosis. 3. Aortic Atherosclerosis (ICD10-I70.0). Electronically Signed   By: Dellis Filbert  Hu M.D.   On: 09/15/2021 12:46   DG CHEST PORT 1 VIEW  Result Date: 10/03/2021 CLINICAL DATA:  Abdominal pain, nausea, vomiting and diarrhea. EXAM: PORTABLE CHEST 1 VIEW COMPARISON:  Portable chest 07/20/2021 FINDINGS: The heart enlarged but unchanged. No vascular congestion is seen. The aorta is tortuous patchy calcification and mild ectasia. Linear scarring or atelectasis again noted left mid field. The lungs are otherwise clear. No pleural effusion is seen. Advanced bilateral shoulder DJD. IMPRESSION: No evidence of acute chest disease or interval changes. Cardiomegaly. Stable chest. Electronically Signed   By: Telford Nab M.D.   On: 10/03/2021 20:32   US Abdomen Limited RUQ (LIVER/GB)  Result Date: 09/16/2021 CLINICAL DATA:  Abdomen pain EXAM: ULTRASOUND ABDOMEN LIMITED RIGHT UPPER QUADRANT COMPARISON:  CT 09/15/2021. FINDINGS: Gallbladder: Status post cholecystectomy. Common bile duct: Diameter: 1.8 mm Liver: Slightly nodular hepatic contour. No focal hepatic abnormality. Liver is slightly echogenic. Portal vein is patent on color Doppler imaging with normal direction of blood flow towards the liver. Other: None. IMPRESSION: 1. Status post cholecystectomy without biliary dilatation 2. Slightly echogenic nodular liver suspicious for cirrhosis Electronically Signed   By: Donavan Foil M.D.   On: 09/16/2021 19:53  [2 weeks]   Assessment: 75 year old female with history of polymyalgia rheumatica on chronic steroids, hypertension, chronic diastolic heart failure, collagen vascular disease who presented to the ED with abdominal pain, vomiting, diarrhea.  Nausea/vomiting: Initially with diminished appetite and nausea for few weeks.  Now with intermittent vomiting, occurring  postprandially but can be triggered by smells.  Associated sharp periumbilical left lower quadrant abdominal pain with heaving.  Symptoms started after diagnosis of left psoas muscle abscess which appears to be improved on current CT. Continues to complain of persistent nausea, unable to eat much. Feels like she does better on liquids because her mouth is so dry and she does not have to chew. She has not had antiemetics in over 48 hours.    EGD 10/05/2021: 1 cm hiatal hernia otherwise unremarkable.  Gastric biopsies pending.  Dysphagia: Intermittent pill dysphagia and occasional globus sensation with liquids.  Esophagus normal on EGD this admission.    Rectal bleeding in the setting of hemorrhoid flare: Colonoscopy December 2021 with benign hemorrhoids.  External hemorrhoids noted on exam during admission, started on Anusol.  H&H at her baseline compared to H&H at time of recent discharge.  Diarrhea: Recent onset, 4-5 watery stools daily and nocturnal stools.  Multiple rounds of recent antibiotics.  C. difficile and GI pathogen panel have been ordered, stool not collected.  Nodular liver: Noted on multiple imaging studies previously, concerning for underlying cirrhosis.  LFTs, platelets normal.  No evidence of portal hypertension on EGD.  Possible NAFLD.  Psoas abscess: CT 10/04/21 with gas and asymmetric soft tissue adjacent to the left of L4-L5. Similar appearance to 09/24/21 CT which was improved compared to 08/2021. MR lumbar spine yesterday with Left paraspinal edema and small peripherally enhancing collection identified within the psoas muscle at the L4 level. A second small peripherally enhancing collection is present in the left psoas muscle at the L5 level. Do not appear substantially changed the prior study. Rocephin and Vancomycin started.   Plan: Follow-up path. Continue PPI. Follow-up stool studies as available. Add around the clock zofran for 48 hours.   Laureen Ochs. Bernarda Caffey [facility] [phone] 12/15/202212:30 PM    LOS: 3 days    Addendum: Reached out to son, [name], at the patient's request to discuss her  GI issues. He is requesting a meeting or telephone call with the attending to answer his questions and help him understand what plan is moving forward for patient's care. He is taking a call with social worker at this time. Will pass this information along to attending.   Laureen Ochs. Bernarda Caffey [facility] [phone] 12/15/202212:49 PM

## 2021-10-06 NOTE — NC FL2 (Signed)
[place] LEVEL OF CARE SCREENING TOOL     IDENTIFICATION  Patient Name: Nicole [name] Birthdate: 1946/01/12 Sex: female Admission Date (Current Location): 10/03/2021  [facility] and [place] Number:  [employer] and Address:  [facility] [address] [place]      Provider Number: [id number]  Attending Physician Name and Address:  Orson Eva, MD  Relative Name and Phone Number:  Vonzell Schlatter (Daughter)   [phone]    Current Level of Care: Hospital Recommended Level of Care: [place] Prior Approval Number:    Date Approved/Denied:   PASRR Number: [id number]  Discharge Plan: SNF    Current Diagnoses: Patient Active Problem List   Diagnosis Date Noted   Psoas abscess ([place]) 10/05/2021   Intractable nausea and vomiting 09/24/2021   Myositis 09/16/2021   Psoas abscess, left (2.0 cm on MRI)  09/16/2021   AKI (acute kidney injury) ([place]) 63/89/3734   Acute metabolic encephalopathy 28/76/8115   Hypokalemia 07/20/2021   Elevated MCV 07/20/2021   Mixed hyperlipidemia 07/20/2021   Diverticulitis 05/02/2018   Diverticulitis of colon 05/01/2018   Morbid obesity with BMI of 50.0-59.9, adult ([place]) 05/01/2018   Chronic diastolic HF (heart failure) ([place]) 05/01/2018   On prednisone therapy 05/31/2017   Positive anti-CCP test 05/31/2017   Rheumatoid factor positive 05/31/2017   Memory loss 04/04/2017   Polymyalgia rheumatica ([place]) 04/04/2017   High risk medication use 12/07/2016   DDD (degenerative disc disease), lumbar 09/27/2016   HTN (hypertension) 06/05/2016   Chronic pain syndrome 06/05/2016   Acute diverticulitis 06/02/2016   Diverticulitis large intestine 06/02/2016   Spinal stenosis of lumbosacral region 02/29/2016   Joint pain 02/29/2016   Endometrial polyp 02/06/2014   Postmenopausal bleeding 01/30/2014   GERD (gastroesophageal reflux disease) 09/29/2013   Spinal stenosis, thoracic 09/29/2013    Shingles 09/29/2013   Thoracic or lumbosacral neuritis or radiculitis, unspecified 05/04/2011   Abnormality of gait 05/04/2011   Muscle weakness (generalized) 05/04/2011   Bilateral primary osteoarthritis of knee 04/07/2009   KNEE PAIN 04/07/2009    Orientation RESPIRATION BLADDER Height & Weight     Self, Time, Situation, Place  Normal Continent Weight: 234 lb 9.1 oz (106.4 kg) Height:  5\' 1"  (154.9 cm)  BEHAVIORAL SYMPTOMS/MOOD NEUROLOGICAL BOWEL NUTRITION STATUS      Continent Diet (Soft. See d/c for updates.)  AMBULATORY STATUS COMMUNICATION OF NEEDS Skin   Extensive Assist Verbally Bruising                       Personal Care Assistance Level of Assistance  Bathing, Feeding, Dressing Bathing Assistance: Maximum assistance Feeding assistance: Limited assistance Dressing Assistance: Maximum assistance     Functional Limitations Info  Sight, Hearing, Speech Sight Info: Impaired Hearing Info: Adequate Speech Info: Adequate    SPECIAL CARE FACTORS FREQUENCY  PT (By licensed PT)     PT Frequency: 5x weekly              Contractures Contractures Info: Not present    Additional Factors Info  Code Status, Allergies, Psychotropic Code Status Info: Full Allergies Info: Oxycontin (oxycodone Hcl), Sulfa Antibiotics, Tramadol, Penicillins, Sulfasalazine Psychotropic Info: Xanax, Trazodone         Current Medications (10/06/2021):  This is the current hospital active medication list Current Facility-Administered Medications  Medication Dose Route Frequency Provider Last Rate Last Admin   0.9 %  sodium chloride infusion   Intravenous Continuous Mariel Aloe, MD  75 mL/hr at 10/05/21 0206 New Bag at 10/05/21 0206   acetaminophen (TYLENOL) tablet 650 mg  650 mg Oral Q6H PRN Zierle-Ghosh, Asia B, DO   650 mg at 10/05/21 0157   Or   acetaminophen (TYLENOL) suppository 650 mg  650 mg Rectal Q6H PRN Zierle-Ghosh, Asia B, DO       ALPRAZolam (XANAX) tablet 0.25 mg   0.25 mg Oral BID PRN Zierle-Ghosh, Asia B, DO   0.25 mg at 10/05/21 2138   atorvastatin (LIPITOR) tablet 20 mg  20 mg Oral Daily Zierle-Ghosh, Asia B, DO   20 mg at 10/06/21 1016   calcitRIOL (ROCALTROL) capsule 0.25 mcg  0.25 mcg Oral Once per day on Mon Wed Fri Zierle-Ghosh, [place] B, DO   0.25 mcg at 10/05/21 8325   cefTRIAXone (ROCEPHIN) 2 g in sodium chloride 0.9 % 100 mL IVPB  2 g Intravenous Q24H Tat, Shanon Brow, MD 200 mL/hr at 10/06/21 1020 2 g at 10/06/21 1020   heparin injection 5,000 Units  5,000 Units Subcutaneous Q8H Zierle-Ghosh, Asia B, DO   5,000 Units at 10/06/21 0433   hydrocortisone (ANUSOL-HC) 2.5 % rectal cream 1 application  1 application Rectal BID Erenest Rasher, PA-C   1 application at 49/82/64 2137   melatonin tablet 6 mg  6 mg Oral QHS Zierle-Ghosh, Asia B, DO   6 mg at 10/05/21 2138   metoprolol succinate (TOPROL-XL) 24 hr tablet 25 mg  25 mg Oral Daily Zierle-Ghosh, Asia B, DO   25 mg at 10/06/21 1016   morphine 2 MG/ML injection 2 mg  2 mg Intravenous Q2H PRN Zierle-Ghosh, Asia B, DO   2 mg at 10/06/21 1014   nystatin (MYCOSTATIN/NYSTOP) topical powder   Topical BID Zierle-Ghosh, Asia B, DO   Given at 10/06/21 1017   ondansetron (ZOFRAN) injection 4 mg  4 mg Intravenous Q6H PRN Tat, Shanon Brow, MD   4 mg at 10/05/21 1055   ondansetron (ZOFRAN) tablet 4 mg  4 mg Oral TID AC & HS Mahala Menghini, PA-C       pantoprazole (PROTONIX) EC tablet 40 mg  40 mg Oral Daily Harper, Kristen S, PA-C   40 mg at 10/06/21 1016   promethazine (PHENERGAN) tablet 12.5 mg  12.5 mg Oral Q6H PRN Zierle-Ghosh, Asia B, DO   12.5 mg at 10/04/21 1583   Or   promethazine (PHENERGAN) 12.5 mg in sodium chloride 0.9 % 50 mL IVPB  12.5 mg Intravenous Q6H PRN Zierle-Ghosh, Asia B, DO       traZODone (DESYREL) tablet 25 mg  25 mg Oral QHS Zierle-Ghosh, Asia B, DO   25 mg at 10/05/21 2140   vancomycin (VANCOREADY) IVPB 2000 mg/400 mL  2,000 mg Intravenous Once Tat, Shanon Brow, MD 200 mL/hr at 10/06/21 1214 2,000 mg  at 10/06/21 1214     Discharge Medications: Please see discharge summary for a list of discharge medications.  Relevant Imaging Results:  Relevant Lab Results:   Additional Information PT SSN: [id number]. COVID vaccine: 06/02/20, 06/23/20. PICC for IV antibiotics.  Salome Arnt, LCSW

## 2021-10-07 ENCOUNTER — Encounter (HOSPITAL_COMMUNITY): Payer: Self-pay | Admitting: Gastroenterology

## 2021-10-07 DIAGNOSIS — R1310 Dysphagia, unspecified: Secondary | ICD-10-CM

## 2021-10-07 LAB — BASIC METABOLIC PANEL
Anion gap: 8 (ref 5–15)
BUN: 7 mg/dL — ABNORMAL LOW (ref 8–23)
CO2: 23 mmol/L (ref 22–32)
Calcium: 8.2 mg/dL — ABNORMAL LOW (ref 8.9–10.3)
Chloride: 109 mmol/L (ref 98–111)
Creatinine, Ser: 0.87 mg/dL (ref 0.44–1.00)
GFR, Estimated: >60 mL/min (ref >60)
Glucose, Bld: 77 mg/dL (ref 70–99)
Potassium: 3.4 mmol/L — ABNORMAL LOW (ref 3.5–5.1)
Sodium: 140 mmol/L (ref 135–145)

## 2021-10-07 LAB — RESP PANEL BY RT-PCR (FLU A&B, COVID) ARPGX2
Influenza A by PCR: NEGATIVE
Influenza B by PCR: NEGATIVE
SARS Coronavirus 2 by RT PCR: NEGATIVE

## 2021-10-07 LAB — MAGNESIUM: Magnesium: 2.2 mg/dL (ref 1.7–2.4)

## 2021-10-07 MED ORDER — POTASSIUM CHLORIDE 10 MEQ/100ML IV SOLN
10.0000 meq | INTRAVENOUS | Status: AC
  Administered 2021-10-07 (×3): 10 meq via INTRAVENOUS
  Filled 2021-10-07: qty 100

## 2021-10-07 MED ORDER — PANTOPRAZOLE SODIUM 40 MG PO TBEC: 40.0000 mg | DELAYED_RELEASE_TABLET | Freq: Two times a day (BID) | ORAL | 1 refills | Status: DC

## 2021-10-07 MED ORDER — NUCYNTA ER 200 MG PO TB12: 100.0000 mg | ORAL_TABLET | Freq: Two times a day (BID) | ORAL | 0 refills | Status: DC

## 2021-10-07 MED ORDER — CEFTRIAXONE IV (FOR PTA / DISCHARGE USE ONLY): 2.0000 g | INTRAVENOUS | 0 refills | Status: AC

## 2021-10-07 MED ORDER — PROMETHAZINE HCL 25 MG/ML IJ SOLN
INTRAMUSCULAR | Status: AC
  Filled 2021-10-07: qty 1

## 2021-10-07 MED ORDER — ONDANSETRON HCL 4 MG PO TABS: 4.0000 mg | ORAL_TABLET | Freq: Three times a day (TID) | ORAL | 0 refills | Status: DC

## 2021-10-07 MED ORDER — VANCOMYCIN IV (FOR PTA / DISCHARGE USE ONLY): 1250.0000 mg | INTRAVENOUS | 0 refills | Status: AC

## 2021-10-07 NOTE — TOC Transition Note (Signed)
Transition of Care [facility]) - CM/SW Discharge Note   Patient Details  Name: Nicole [name] MRN: [id number] Date of Birth: 12/21/1945  [facility] CM/SW Contact:  Shade Flood, LCSW Phone Number: 10/07/2021, 2:12 PM   Clinical Narrative:     Pt stable for dc today per MD. Damaris Schooner with pt and son about bed offers and they have selected [facility]. Updated Mardene Celeste at [facility] and they can accept today as long as they get dc orders/summary by 3pm and pt has repeat negative covid test.   Covid test pending. Will send dc clinical electronically as soon as it is complete. RN to call report. Pt will transport with EMS.  There are no other TOC needs for dc.  Final next level of care: Skilled Nursing Facility Barriers to Discharge: Barriers Resolved   Patient Goals and CMS Choice Patient states their goals for this hospitalization and ongoing recovery are:: rehab   Choice offered to / list presented to : Patient  Discharge Placement   Existing PASRR number confirmed : 10/06/21          [facility] Patient to be transferred to facility by: EMS Name of family member notified: Georgia Duff Patient and family notified of of transfer: 10/07/21  Discharge Plan and Services In-house Referral: Clinical Social Work   Post Acute Care Choice: Resumption of Svcs/PTA Provider          DME Arranged: N/A DME Agency: NA       HH Arranged: [place] Agency: Other - See comment (in house PT) Date [place]: 10/04/21 Time HH Agency Contacted: 0840 Representative spoke with at [place]: In house PT at [place] ([place]) Interventions     Readmission Risk Interventions Readmission Risk Prevention Plan 10/04/2021 09/26/2021  Transportation Screening Complete Complete  HRI or Home Care Consult Complete Complete  Social Work Consult for [place] Planning/Counseling Complete Complete  Palliative Care Screening Not  Applicable Not Applicable  Medication Review Press photographer) Complete Complete  Some recent data might be hidden

## 2021-10-07 NOTE — Progress Notes (Signed)
PHARMACY CONSULT NOTE FOR:  OUTPATIENT  PARENTERAL ANTIBIOTIC THERAPY (OPAT)  Indication: abscess Regimen: Vancomycin 1250 mg IV every 24 hours. Next dose due 12/17 1200 Ceftriaxone 2000 mg IV every 24 hours. Next dose due 12/17 1000 End date: Last day of therapy 11/02/2021  IV antibiotic discharge orders are pended. To discharging provider:  please sign these orders via discharge navigator,  Select New Orders & click on the button choice - Manage This Unsigned Work.     Thank you for allowing pharmacy to be a part of this patient's care.  Ramond Craver 10/07/2021, 10:46 AM
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
## 2021-10-07 NOTE — Care Management Important Message (Signed)
Important Message  Patient Details  Name: Nicole [name] MRN: [id number] Date of Birth: April 29, 1946   Medicare Important Message Given:  Yes     Tommy Medal 10/07/2021, 3:33 PM

## 2021-10-07 NOTE — Discharge Summary (Addendum)
Physician Discharge Summary  Nicole [name] LYY:[id number] DOB: 1946/10/09 DOA: 10/03/2021  PCP: Jake Samples, PA-C  Admit date: 10/03/2021 Discharge date: 10/07/2021  Admitted From: Home Disposition:  SNF  Recommendations for Outpatient Follow-up:  Follow up with PCP in 1-2 weeks Please obtain BMP/CBC in one week Please remove PICC line after last dose of IV abx on 11/02/21 Please obtain CT abd/pelvis or MR lumbar spine 1 week before end of IV abx (first week in January 2023) See below regarding labs and instructions for vancomycin and ceftriaxone   Equipment/Devices:PICC placed 10/06/21 Discharge Condition: Stable CODE STATUS:FULL Diet recommendation: soft heart healthy   Brief/Interim Summary: 75 year old female with a history of diastolic CHF, hypertension, polymyalgia rheumatica on chronic steroids presenting with 2-day history of left lower quadrant abdominal pain with vomiting.   The patient has had 2 hospital admissions recently for abdominal pain with intractable nausea and vomiting.  She was initially admitted from 09/15/2021 to 09/11/2021 with similar symptoms.  She was diagnosed with a psoas abscess at that time.  It was felt the abscess was too small to be drained.  The patient was treated with IV antibiotics and discharged home with doxycycline.  She was admitted on 09/24/2021 to 09/29/2021 with nausea and poor oral intake and AKI.  Repeat CT of the abdomen pelvis at that time showed improvement of her psoas abscess.   Patient states that she was feeling well before the prior 3 weeks but then she was presenting with nightly episodes of nausea almost on a daily basis and regurgitation of food contents.  She has also presented with decreased appetite.  The patient also endorsed having some persistent pain in her lower abdomen, which is worsening her left lower quadrant.  Has noticed that her stools are more watery and have been more frequent than usual, up to 5 bowel  movements per day with some episode of nocturnal defecation. GI was consulted to assist with management.  Discharge Diagnoses:  Acute kidney injury -Baseline creatinine 0.9-1.1 -serum creatinine peaked 2.28 -Secondary to volume depletion -Continue IV fluids>>improving -serum creatinine 0.87 on day of d/c   Intractable nausea/vomiting/abdominal pain -GI consult appreciated -10/04/2021 CT abd--gas and asymmetric soft tissue adjacent to the left L4-5 disc space similar to prior CT on 09/24/2021; no bowel wall thickening.  Stable partially calcified left pelvic mass -10/21/2021 CT abd--decreased size of gas and fluid collection adjacent to left L4-5 disc space. -12/14 EGD--normal esophagus, stomach, duodenum -Continue Protonix--increase to bid -no BM or emesis in last 48 hours -suspect a degree of gastric/esophageal dysmotility, exacerbated by acute medical illness -no diarrhea during hospitalization -Encouraged her to stay upright for at least 30 minutes after taking medication. Sit upright for at least 2-3 hours after meals.  -at time of d/c LLQ pain continues to improve and patient was able to tolerate diet -she will need GI follow up after d/c -zofran ac/hs x 3 days scheduled, then prn   Psoas abscess -The patient apparently completed antibiotics at the time of her discharge on 09/29/2021 -10/04/2021 CT abdomen--still shows gas and asymmetric soft tissue adjacent to the left L4-5 -Blood cultures x2 sets--neg to date -Restart antibiotics IV after cultures -12/14--MR lumbar spine--No substantial change in small left psoas abscesses/fluid collections. No evidence discitis/osteomyelitis. -ESR 75 (60) -CRP 1.8 (0.8) -given persistent abd pain, persistent radiographic evidence of abscess and increased inflammatory markers, continue IV abx -plan IV vanc/ceftriaxone x 28 days--last day 11/02/21 -obtain CT abd/pelvis or MR Lumbar spine one week before end  of IV abx to clarify need for  continued abx   Chronic diastolic CHF -Clinically hypovolemic -07/21/2019 echo EF 65-70; no WMA, trivial pericardial effusion, normal RV -restart torsemide after d/c   Hypokalemia/hypomagnesemia -Repleted   Rectal bleeding -Anusol per GI service   Mixed hyperlipidemia -Continue statin   Morbid Obesity -BMI 44.32 -lifestyle modification    Discharge Instructions  Discharge Instructions     [facility] pharmacist to adjust dose for Vancomycin, Aminoglycosides and other anti-infective therapies as requested by physician.   Complete by: As directed    [facility] to provide Cath Flo 96m   Complete by: As directed    Administer for PICC line occlusion and as ordered by physician for other access device issues.   Anaphylaxis Kit: Provided to treat any anaphylactic reaction to the medication being provided to the patient if First Dose or when requested by physician   Complete by: As directed    Epinephrine 110mml vial / amp: Administer 0.3m46m0.3ml84mubcutaneously once for moderate to severe anaphylaxis, nurse to call physician and pharmacy when reaction occurs and call 911 if needed for immediate care   Diphenhydramine 50mg64mIV vial: Administer 25-50mg 3mM PRN for first dose reaction, rash, itching, mild reaction, nurse to call physician and pharmacy when reaction occurs   Sodium Chloride 0.9% NS 500ml I28mdminister if needed for hypovolemic blood pressure drop or as ordered by physician after call to physician with anaphylactic reaction   Change dressing on IV access line weekly and PRN   Complete by: As directed    Flush IV access with Sodium Chloride 0.9% and Heparin 10 units/ml or 100 units/ml   Complete by: As directed    Home infusion instructions - [facility]   Complete by: As directed    Instructions: Flush IV access with Sodium Chloride 0.9% and Heparin 10units/ml or 100units/ml   Change dressing on IV access line: Weekly and PRN    Instructions Cath Flo 2mg: Ad75mister for PICC Line occlusion and as ordered by physician for other access device   [facility] pharmacist to adjust dose for: Vancomycin, Aminoglycosides and other anti-infective therapies as requested by physician   Method of administration may be changed at the discretion of home infusion pharmacist based upon assessment of the patient and/or caregivers ability to self-administer the medication ordered   Complete by: As directed       Allergies as of 10/07/2021       Reactions   Oxycontin [oxycodone Hcl] Swelling   Pt tolerates hydromorphone.   Sulfa Antibiotics Other (See Comments)   Sores   Tramadol    "Makes me feel like I am falling"   Penicillins Rash   Sulfasalazine Other (See Comments)   Sores        Medication List     TAKE these medications    acetaminophen 325 MG tablet Commonly known as: TYLENOL Take 2 tablets (650 mg total) by mouth every 6 (six) hours as needed for mild pain or headache (or Fever >/= 101).   ALPRAZolam 0.25 MG tablet Commonly known as: XANAX Take 1 tablet (0.25 mg total) by mouth daily as needed. What changed: when to take this   atorvastatin 20 MG tablet Commonly known as: LIPITOR Take 20 mg by mouth daily.   calcitRIOL 0.25 MCG capsule Commonly known as: ROCALTROL Take 0.25 mcg by mouth See admin instructions. Take 0.25 every Mon, Wed, Fri.   cefTRIAXone  IVPB Commonly known as: ROCEPHIN  Inject 2 g into the vein daily for 26 days. Indication:  abscess First Dose: Yes Last Day of Therapy:  11/02/2021 Labs - Once weekly:  CBC/D and BMP, Labs - Every other week:  ESR and CRP Method of administration: IV Push Method of administration may be changed at the discretion of home infusion pharmacist based upon assessment of the patient and/or caregiver's ability to self-administer the medication ordered. Start taking on: October 08, 2021   cyclobenzaprine 5 MG tablet Commonly known as:  FLEXERIL Take 5 mg by mouth every 12 (twelve) hours as needed for muscle spasms.   diclofenac Sodium 1 % Gel Commonly known as: VOLTAREN Apply 2 g topically 4 (four) times daily as needed (pain).   GAS-X PO Take 1 tablet by mouth 3 (three) times daily as needed (flatulence).   lactose free nutrition Liqd Take 237 mLs by mouth 3 (three) times daily between meals.   leflunomide 10 MG tablet Commonly known as: ARAVA TAKE (1) TABLET BY MOUTH DAILY. What changed: See the new instructions.   lidocaine 5 % Commonly known as: LIDODERM Place 1-2 patches onto the skin every 12 (twelve) hours as needed (pain).   melatonin 3 MG Tabs tablet Take 3 mg by mouth at bedtime.   metolazone 5 MG tablet Commonly known as: ZAROXOLYN Take 5 mg by mouth 2 (two) times a week. Tuesday and Fridays   metoprolol succinate 25 MG 24 hr tablet Commonly known as: Toprol XL Take 1 tablet (25 mg total) by mouth daily.   MULTIVITAMIN PO Take 1 tablet by mouth daily.   Nucynta ER 200 MG Tb12 Generic drug: Tapentadol HCl Take 100 mg by mouth every 12 (twelve) hours.   ondansetron 4 MG tablet Commonly known as: ZOFRAN Take 4 mg by mouth every 8 (eight) hours as needed for nausea or vomiting. What changed: Another medication with the same name was added. Make sure you understand how and when to take each.   ondansetron 4 MG tablet Commonly known as: ZOFRAN Take 1 tablet (4 mg total) by mouth 4 (four) times daily -  before meals and at bedtime. X 3 days What changed: You were already taking a medication with the same name, and this prescription was added. Make sure you understand how and when to take each.   pantoprazole 40 MG tablet Commonly known as: PROTONIX Take 1 tablet (40 mg total) by mouth 2 (two) times daily. What changed: when to take this   potassium chloride 10 MEQ tablet Commonly known as: KLOR-CON Take 1 tablet (10 mEq total) by mouth 2 (two) times daily. While taking torsemide    predniSONE 1 MG tablet Commonly known as: DELTASONE Take 9 tablets (9 mg total) by mouth daily with breakfast.   Torsemide 40 MG Tabs Take 40 mg by mouth 2 (two) times daily.   traZODone 50 MG tablet Commonly known as: DESYREL Take 25 mg by mouth at bedtime.   trolamine salicylate 10 % cream Commonly known as: ASPERCREME Apply 1 application topically as needed for muscle pain.   vancomycin  IVPB Inject 1,250 mg into the vein daily for 26 days. Indication:  abscess First Dose: Yes Last Day of Therapy:  11/02/2021 Labs - Sunday/Monday:  CBC/D, BMP, and vancomycin trough. Labs - Thursday:  BMP and vancomycin trough Labs - Every other week:  ESR and CRP Method of administration:Elastomeric Method of administration may be changed at the discretion of the patient and/or caregiver's ability to self-administer the medication ordered. Start  taking on: October 08, 2021               Discharge Care Instructions  (From admission, onward)           Start     Ordered   10/07/21 0000  Change dressing on IV access line weekly and PRN  (Home infusion instructions - [facility] )        10/07/21 1318            Allergies  Allergen Reactions   Oxycontin [Oxycodone Hcl] Swelling    Pt tolerates hydromorphone.   Sulfa Antibiotics Other (See Comments)    Sores   Tramadol     "Makes me feel like I am falling"   Penicillins Rash   Sulfasalazine Other (See Comments)    Sores    Consultations: GI   Procedures/Studies: CT ABDOMEN PELVIS WO CONTRAST  Result Date: 10/03/2021 CLINICAL DATA:  Left lower quadrant abdominal pain EXAM: CT ABDOMEN AND PELVIS WITHOUT CONTRAST TECHNIQUE: Multidetector CT imaging of the abdomen and pelvis was performed following the standard protocol without IV contrast. COMPARISON:  CT abdomen and pelvis dated September 24, 2021 FINDINGS: Lower chest: Bibasilar atelectasis.  Coronary artery calcifications. Hepatobiliary: Mildly nodular  liver contour. No focal liver abnormality is seen. Status post cholecystectomy. No biliary dilatation. Pancreas: Unremarkable. No pancreatic ductal dilatation or surrounding inflammatory changes. Spleen: Normal in size without focal abnormality. Adrenals/Urinary Tract: Adrenal glands are unremarkable. Kidneys are normal, without renal calculi, focal lesion, or hydronephrosis. Bladder is unremarkable. Stomach/Bowel: Stomach is within normal limits. Appendix appears normal. Diverticulosis. No evidence of bowel wall thickening, distention, or inflammatory changes. Vascular/Lymphatic: Aortic atherosclerosis. No enlarged abdominal or pelvic lymph nodes. Reproductive: Stable partially calcified left pelvic mass. Other: Small fat containing umbilical hernia. No abdominopelvic ascites. Musculoskeletal: Gas and asymmetric soft tissue adjacent to the left L4-L5 disc space is similar in appearance when compared with December 3rd 2022 prior. IMPRESSION: 1. No acute findings in the abdomen or pelvis. 2. Gas and asymmetric soft tissue adjacent to the left L4-L5 disc space is similar in appearance when compared with December 3rd 2022 prior. 3. Stable calcified left pelvic mass. 4. Coronary artery calcifications and aortic Atherosclerosis (ICD10-I70.0). Electronically Signed   By: Yetta Glassman M.D.   On: 10/03/2021 20:36   CT ABDOMEN PELVIS WO CONTRAST  Result Date: 09/24/2021 CLINICAL DATA:  Abdominal abscess. EXAM: CT ABDOMEN AND PELVIS WITHOUT CONTRAST TECHNIQUE: Multidetector CT imaging of the abdomen and pelvis was performed following the standard protocol without IV contrast. COMPARISON:  September 15, 2021 FINDINGS: Lower chest: Atelectasis versus scarring in the left lung base. Calcific atherosclerotic disease of the aorta and coronary arteries. Hepatobiliary: No focal liver abnormality is seen. Status post cholecystectomy. No biliary dilatation. Pancreas: Unremarkable. No pancreatic ductal dilatation or surrounding  inflammatory changes. Spleen: Normal in size without focal abnormality. Adrenals/Urinary Tract: Adrenal glands are unremarkable. Kidneys are normal, without renal calculi, focal lesion, or hydronephrosis. Bladder is unremarkable. Stomach/Bowel: Stomach is within normal limits. Appendix appears normal. No evidence of bowel wall thickening, distention, or inflammatory changes. Vascular/Lymphatic: Aortic atherosclerosis. No enlarged abdominal or pelvic lymph nodes. Reproductive: Stable partially calcified left pelvic mass. Normal appearance of the right ovary and uterus. Other: No abdominal wall hernia or abnormality. No abdominopelvic ascites. Musculoskeletal: Marked arthritic changes of the lumbosacral spine. Scoliosis. Prior lumbar spine decompensation. Decreased in size gas and fluid collection adjacent to left L4-L5 disc space and extending into the left psoas muscle. Prominent left-sided  L4-L5 disc bulge. IMPRESSION: 1. Decreased in size known gas and fluid collection adjacent to left L4-L5 disc space, extending into the left psoas muscle. 2. Stable partially calcified left pelvic mass. 3. Calcific atherosclerotic disease of the aorta and coronary arteries. Aortic Atherosclerosis (ICD10-I70.0). Electronically Signed   By: Fidela Salisbury M.D.   On: 09/24/2021 18:13   MR LUMBAR SPINE WO CONTRAST  Result Date: 09/16/2021 CLINICAL DATA:  Back pain common gas seen in psoas muscle on recent CT EXAM: MRI LUMBAR SPINE WITHOUT CONTRAST TECHNIQUE: Multiplanar, multisequence MR imaging of the lumbar spine was performed. No intravenous contrast was administered. COMPARISON:  CT abdomen/pelvis dated 1 day prior, lumbar spine MRI 06/21/2021 FINDINGS: Segmentation: There is transitional anatomy with lumbarization of the S1 vertebral body. For the purposes of this report, there is a rudimentary S1-S2 disc space. Alignment:  There is trace retrolisthesis of L1 on L2 and L2 on L3. Vertebrae: There are postsurgical  changes reflecting posterior decompression from L2 through L5. Vertebral body heights are preserved. There is multilevel degenerative endplate marrow signal abnormality throughout the lumbar spine. There is no suspicious marrow signal abnormality. There is no marrow edema to suggest infection. There is no definite evidence of septic arthritis. Conus medullaris and cauda equina: Conus extends to the L1-L2 level. Conus and cauda equina appear normal. Paraspinal and other soft tissues: There is a 2.0 cm x 0.8 cm by 1.5 cm fluid collection in the left psoas muscle at the L5 level. There may be an additional smaller abscess measuring up to 0.8 cm more superiorly at the L4 level (7-15, 11-18). The paraspinal soft tissues are otherwise unremarkable. Disc levels: There is multilevel intervertebral disc space narrowing. Marked T2 hypointensity in the anterior aspect of the L3-L4 and L4-L5 disc spaces is consistent with vacuum disc phenomenon. There is advanced multilevel facet arthropathy with prominent effusions at L4-L5 and L5-S1, similar to the prior study. T12-L1: Is a mild disc bulge and ligamentum flavum thickening resulting in mild spinal canal stenosis and mild-to-moderate bilateral neural foraminal stenosis. L1-L2: Status post posterior decompression. There is a prominent diffuse disc bulge, degenerative endplate change, and bilateral facet arthropathy resulting in mild spinal canal stenosis and severe bilateral neural foraminal stenosis, unchanged. L2-L3: Status post posterior decompression. There is a mild disc bulge, degenerative endplate change, and bilateral facet arthropathy resulting in severe bilateral neural foraminal stenosis without significant spinal canal stenosis, unchanged. L3-L4: Status post posterior decompression. There is a prominent diffuse disc bulge, degenerative endplate change, and bilateral facet arthropathy resulting in severe bilateral neural foraminal stenosis without significant spinal  canal stenosis, unchanged. L4-L5: Status post posterior decompression. There is a prominent diffuse disc bulge, degenerative endplate change, and bilateral facet arthropathy resulting in mild spinal canal stenosis and severe bilateral neural foraminal stenosis, unchanged. L5-S1: There is a diffuse disc bulge with a left subarticular zone annular fissure, degenerative endplate change, and bilateral facet arthropathy resulting in severe left worse than right neural foraminal stenosis without significant spinal canal stenosis, unchanged. IMPRESSION: 1. Postcontrast images were not obtained as the patient could not complete the study. Within this confines: 2. 2.0 cm fluid collection in the left psoas muscle at the L5 level concerning for abscess with a possible additional smaller abscess at the L4 level. 3. No abnormal marrow edema to suggest adjacent discitis/osteomyelitis, though as above, postcontrast images were not obtained. 4. Severe multilevel degenerative changes as above, not significantly changed since 06/21/2021. Electronically Signed   By: Court Joy.D.  On: 09/16/2021 14:07   MR Lumbar Spine W Wo Contrast  Result Date: 10/05/2021 CLINICAL DATA:  Low back pain, infection suspected, positive xray/CT psoas abscess, continued pain EXAM: MRI LUMBAR SPINE WITHOUT AND WITH CONTRAST TECHNIQUE: Multiplanar and multiecho pulse sequences of the lumbar spine were obtained without and with intravenous contrast. CONTRAST:  36m GADAVIST GADOBUTROL 1 MMOL/ML IV SOLN COMPARISON:  09/16/2021 FINDINGS: Segmentation: Numbering is kept consistent with the prior study with transitional anatomy at the lumbosacral junction and partial lumbarization of S1. Alignment:  Stable. Vertebrae: Stable vertebral body heights. Postsurgical changes of posterior decompression are again identified from L1-L2 through L5-S1. There is no new marrow edema. Conus medullaris and cauda equina: Conus extends to the L1-L2 level. Conus and  cauda equina appear normal. No abnormal intrathecal enhancement. Paraspinal and other soft tissues: Postoperative changes. Left paraspinal edema and small peripherally enhancing collection identified within the psoas muscle at the L4 level. A second small peripherally enhancing collection is present in the left psoas muscle at the L5 level. Do not appear substantially changed the prior study. Disc levels: Similar disc edema at several levels. There is no associated enhancement. Degenerative changes are stable over the short interval. IMPRESSION: No substantial change in small left psoas abscesses/fluid collections. No evidence discitis/osteomyelitis. Electronically Signed   By: PMacy MisM.D.   On: 10/05/2021 17:55   MR PELVIS WO CONTRAST  Result Date: 09/16/2021 CLINICAL DATA:  Intramuscular gas within the left iliopsoas muscle on CT. Severe back pain. EXAM: MRI PELVIS WITHOUT CONTRAST TECHNIQUE: Multiplanar multisequence MR imaging of the pelvis was performed. No intravenous contrast was administered. Patient was unable to continue the examination. No contrast was administered. Lumbar spine findings are dictated separately. COMPARISON:  Abdominopelvic CT 09/15/2021 and 06/21/2021. FINDINGS: Bones/Joint/Cartilage Within the pelvis, there is no evidence of acute fracture or dislocation. There are mild degenerative changes of both hips. No evidence of avascular necrosis or significant hip joint effusion. The sacroiliac joints are intact with mild degenerative changes on the left. There is no sacroiliac joint effusion or suspicious subchondral signal abnormality. There is multilevel lumbar spondylosis status post multilevel posterior decompression. Lumbar spine findings are dictated separately. Ligaments Not relevant for exam/indication. Muscles and Tendons Associated with the gas in the left iliopsoas muscle on recent CT is a small amount of muscular edema as well as a small fluid collection measuring up to  1.8 cm posterior to the muscle at the L5 level. No drainable fluid collection identified. Muscular edema extends into the pelvis without associated focal fluid collection. There is no significant fluid in the iliopsoas bursa. There is asymmetric fatty atrophy of the left gluteus, piriformis and hamstring muscles. No acute tendon tear identified. Soft tissues There is a chronic left adnexal mass measuring 3.7 cm on image 29/7. This demonstrates low T1 and T2 signal, corresponding with calcification on CT. This mass appears grossly unchanged from CTs dating back to 08/20/2013. As above, left paraspinal inflammatory changes and gas within the ileus psoas muscle. No other focal fluid collections are identified. IMPRESSION: 1. There is a small amount of muscular edema within the left iliopsoas muscle, associated with a small paraspinal fluid collection at the L5 level. See separate examination of the lumbar spine. 2. No intrapelvic fluid collections. 3. Mild degenerative changes of the hips and sacroiliac joints. 4. Stable partially calcified left adnexal mass from at least 2014, consistent with a benign finding. Electronically Signed   By: WRichardean SaleM.D.   On: 09/16/2021 13:50  CT Abdomen Pelvis W Contrast  Result Date: 09/15/2021 CLINICAL DATA:  75 year old female with acute LEFT abdominal and pelvic pain for 2 days. EXAM: CT ABDOMEN AND PELVIS WITH CONTRAST TECHNIQUE: Multidetector CT imaging of the abdomen and pelvis was performed using the standard protocol following bolus administration of intravenous contrast. CONTRAST:  32m OMNIPAQUE IOHEXOL 300 MG/ML  SOLN COMPARISON:  06/21/2021 CT and prior studies FINDINGS: Lower chest: No acute abnormality Hepatobiliary: Mildly nodular hepatic contour or likely represent cirrhosis. No other hepatic abnormalities are noted. Patient is status post cholecystectomy. No biliary dilatation. Pancreas: Unremarkable Spleen: Unremarkable Adrenals/Urinary Tract: The  kidneys, adrenal glands and bladder are unremarkable. No hydronephrosis or renal calculi. Stomach/Bowel: Scattered diverticula within the descending and sigmoid colon noted without evidence of acute diverticulitis. There is no evidence of bowel obstruction or focal collection. The appendix is normal. Vascular/Lymphatic: Aortic atherosclerosis. No enlarged abdominal or pelvic lymph nodes. Reproductive: No new abnormalities. A 4 cm LEFT adnexal mass appears unchanged from remote studies. Other: No ascites or pneumoperitoneum. Musculoskeletal: Again noted is moderate to severe multilevel degenerative disc disease, spondylosis and facet arthropathy throughout the lumbar spine with vacuum disc phenomenon. However, now noted are foci of gas within the LEFT psoas muscle which appears to extend from the L3-4 or L4-5 vacuum discs. No definite LEFT psoas focal collection or enhancement is identified. IMPRESSION: 1. New foci of gas within the LEFT psoas muscle which appears to extend from L3-4 or L4-5 degenerative vacuum discs. Although this may represent noninfectious extension of gas from the disc space, infection is not excluded although there is no definite evidence of focal collection/enhancement of the LEFT psoas muscle. Recommend MRI with and without contrast for further evaluation as clinically indicated. 2. Mildly nodular hepatic contour suspicious for cirrhosis. 3. Aortic Atherosclerosis (ICD10-I70.0). Electronically Signed   By: JMargarette CanadaM.D.   On: 09/15/2021 12:46   DG CHEST PORT 1 VIEW  Result Date: 10/03/2021 CLINICAL DATA:  Abdominal pain, nausea, vomiting and diarrhea. EXAM: PORTABLE CHEST 1 VIEW COMPARISON:  Portable chest 07/20/2021 FINDINGS: The heart enlarged but unchanged. No vascular congestion is seen. The aorta is tortuous patchy calcification and mild ectasia. Linear scarring or atelectasis again noted left mid field. The lungs are otherwise clear. No pleural effusion is seen. Advanced  bilateral shoulder DJD. IMPRESSION: No evidence of acute chest disease or interval changes. Cardiomegaly. Stable chest. Electronically Signed   By: KTelford NabM.D.   On: 10/03/2021 20:32   [place] SITE RITE  Result Date: 10/06/2021 If Site Rite image not attached, placement could not be confirmed due to current cardiac rhythm.  [place]dom[place] Limited RUQ (LIVER/GB)  Result Date: 09/16/2021 CLINICAL DATA:  Abdomen pain EXAM: ULTRASOUND ABDOMEN LIMITED RIGHT UPPER QUADRANT COMPARISON:  CT 09/15/2021. FINDINGS: Gallbladder: Status post cholecystectomy. Common bile duct: Diameter: 1.8 mm Liver: Slightly nodular hepatic contour. No focal hepatic abnormality. Liver is slightly echogenic. Portal vein is patent on color Doppler imaging with normal direction of blood flow towards the liver. Other: None. IMPRESSION: 1. Status post cholecystectomy without biliary dilatation 2. Slightly echogenic nodular liver suspicious for cirrhosis Electronically Signed   By: KDonavan FoilM.D.   On: 09/16/2021 19:53        Discharge Exam: Vitals:   10/07/21 0537 10/07/21 1242  BP: 136/76 115/64  Pulse: 95 88  Resp: 19 18  Temp: 98.2 F (36.8 C) 98.1 F (36.7 C)  SpO2: 99% 94%   Vitals:   10/06/21 1339 10/06/21 2134 10/07/21  0537 10/07/21 1242  BP: 117/69 123/68 136/76 115/64  Pulse: 95 89 95 88  Resp: '17 19 19 18  ' Temp: 98.5 F (36.9 C) 98.2 F (36.8 C) 98.2 F (36.8 C) 98.1 F (36.7 C)  TempSrc: Oral   Oral  SpO2: 96% 97% 99% 94%  Weight:   106.2 kg   Height:        General: Pt is alert, awake, not in acute distress Cardiovascular: RRR, S1/S2 +, no rubs, no gallops Respiratory: CTA bilaterally, no wheezing, no rhonchi Abdominal: Soft, LLQ pain, ND, bowel sounds + Extremities: no edema, no cyanosis   The results of significant diagnostics from this hospitalization (including imaging, microbiology, ancillary and laboratory) are listed below for reference.    Significant Diagnostic  Studies: CT ABDOMEN PELVIS WO CONTRAST  Result Date: 10/03/2021 CLINICAL DATA:  Left lower quadrant abdominal pain EXAM: CT ABDOMEN AND PELVIS WITHOUT CONTRAST TECHNIQUE: Multidetector CT imaging of the abdomen and pelvis was performed following the standard protocol without IV contrast. COMPARISON:  CT abdomen and pelvis dated September 24, 2021 FINDINGS: Lower chest: Bibasilar atelectasis.  Coronary artery calcifications. Hepatobiliary: Mildly nodular liver contour. No focal liver abnormality is seen. Status post cholecystectomy. No biliary dilatation. Pancreas: Unremarkable. No pancreatic ductal dilatation or surrounding inflammatory changes. Spleen: Normal in size without focal abnormality. Adrenals/Urinary Tract: Adrenal glands are unremarkable. Kidneys are normal, without renal calculi, focal lesion, or hydronephrosis. Bladder is unremarkable. Stomach/Bowel: Stomach is within normal limits. Appendix appears normal. Diverticulosis. No evidence of bowel wall thickening, distention, or inflammatory changes. Vascular/Lymphatic: Aortic atherosclerosis. No enlarged abdominal or pelvic lymph nodes. Reproductive: Stable partially calcified left pelvic mass. Other: Small fat containing umbilical hernia. No abdominopelvic ascites. Musculoskeletal: Gas and asymmetric soft tissue adjacent to the left L4-L5 disc space is similar in appearance when compared with December 3rd 2022 prior. IMPRESSION: 1. No acute findings in the abdomen or pelvis. 2. Gas and asymmetric soft tissue adjacent to the left L4-L5 disc space is similar in appearance when compared with December 3rd 2022 prior. 3. Stable calcified left pelvic mass. 4. Coronary artery calcifications and aortic Atherosclerosis (ICD10-I70.0). Electronically Signed   By: Yetta Glassman M.D.   On: 10/03/2021 20:36   CT ABDOMEN PELVIS WO CONTRAST  Result Date: 09/24/2021 CLINICAL DATA:  Abdominal abscess. EXAM: CT ABDOMEN AND PELVIS WITHOUT CONTRAST TECHNIQUE:  Multidetector CT imaging of the abdomen and pelvis was performed following the standard protocol without IV contrast. COMPARISON:  September 15, 2021 FINDINGS: Lower chest: Atelectasis versus scarring in the left lung base. Calcific atherosclerotic disease of the aorta and coronary arteries. Hepatobiliary: No focal liver abnormality is seen. Status post cholecystectomy. No biliary dilatation. Pancreas: Unremarkable. No pancreatic ductal dilatation or surrounding inflammatory changes. Spleen: Normal in size without focal abnormality. Adrenals/Urinary Tract: Adrenal glands are unremarkable. Kidneys are normal, without renal calculi, focal lesion, or hydronephrosis. Bladder is unremarkable. Stomach/Bowel: Stomach is within normal limits. Appendix appears normal. No evidence of bowel wall thickening, distention, or inflammatory changes. Vascular/Lymphatic: Aortic atherosclerosis. No enlarged abdominal or pelvic lymph nodes. Reproductive: Stable partially calcified left pelvic mass. Normal appearance of the right ovary and uterus. Other: No abdominal wall hernia or abnormality. No abdominopelvic ascites. Musculoskeletal: Marked arthritic changes of the lumbosacral spine. Scoliosis. Prior lumbar spine decompensation. Decreased in size gas and fluid collection adjacent to left L4-L5 disc space and extending into the left psoas muscle. Prominent left-sided L4-L5 disc bulge. IMPRESSION: 1. Decreased in size known gas and fluid collection adjacent to left L4-L5  disc space, extending into the left psoas muscle. 2. Stable partially calcified left pelvic mass. 3. Calcific atherosclerotic disease of the aorta and coronary arteries. Aortic Atherosclerosis (ICD10-I70.0). Electronically Signed   By: Fidela Salisbury M.D.   On: 09/24/2021 18:13   MR LUMBAR SPINE WO CONTRAST  Result Date: 09/16/2021 CLINICAL DATA:  Back pain common gas seen in psoas muscle on recent CT EXAM: MRI LUMBAR SPINE WITHOUT CONTRAST TECHNIQUE:  Multiplanar, multisequence MR imaging of the lumbar spine was performed. No intravenous contrast was administered. COMPARISON:  CT abdomen/pelvis dated 1 day prior, lumbar spine MRI 06/21/2021 FINDINGS: Segmentation: There is transitional anatomy with lumbarization of the S1 vertebral body. For the purposes of this report, there is a rudimentary S1-S2 disc space. Alignment:  There is trace retrolisthesis of L1 on L2 and L2 on L3. Vertebrae: There are postsurgical changes reflecting posterior decompression from L2 through L5. Vertebral body heights are preserved. There is multilevel degenerative endplate marrow signal abnormality throughout the lumbar spine. There is no suspicious marrow signal abnormality. There is no marrow edema to suggest infection. There is no definite evidence of septic arthritis. Conus medullaris and cauda equina: Conus extends to the L1-L2 level. Conus and cauda equina appear normal. Paraspinal and other soft tissues: There is a 2.0 cm x 0.8 cm by 1.5 cm fluid collection in the left psoas muscle at the L5 level. There may be an additional smaller abscess measuring up to 0.8 cm more superiorly at the L4 level (7-15, 11-18). The paraspinal soft tissues are otherwise unremarkable. Disc levels: There is multilevel intervertebral disc space narrowing. Marked T2 hypointensity in the anterior aspect of the L3-L4 and L4-L5 disc spaces is consistent with vacuum disc phenomenon. There is advanced multilevel facet arthropathy with prominent effusions at L4-L5 and L5-S1, similar to the prior study. T12-L1: Is a mild disc bulge and ligamentum flavum thickening resulting in mild spinal canal stenosis and mild-to-moderate bilateral neural foraminal stenosis. L1-L2: Status post posterior decompression. There is a prominent diffuse disc bulge, degenerative endplate change, and bilateral facet arthropathy resulting in mild spinal canal stenosis and severe bilateral neural foraminal stenosis, unchanged. L2-L3:  Status post posterior decompression. There is a mild disc bulge, degenerative endplate change, and bilateral facet arthropathy resulting in severe bilateral neural foraminal stenosis without significant spinal canal stenosis, unchanged. L3-L4: Status post posterior decompression. There is a prominent diffuse disc bulge, degenerative endplate change, and bilateral facet arthropathy resulting in severe bilateral neural foraminal stenosis without significant spinal canal stenosis, unchanged. L4-L5: Status post posterior decompression. There is a prominent diffuse disc bulge, degenerative endplate change, and bilateral facet arthropathy resulting in mild spinal canal stenosis and severe bilateral neural foraminal stenosis, unchanged. L5-S1: There is a diffuse disc bulge with a left subarticular zone annular fissure, degenerative endplate change, and bilateral facet arthropathy resulting in severe left worse than right neural foraminal stenosis without significant spinal canal stenosis, unchanged. IMPRESSION: 1. Postcontrast images were not obtained as the patient could not complete the study. Within this confines: 2. 2.0 cm fluid collection in the left psoas muscle at the L5 level concerning for abscess with a possible additional smaller abscess at the L4 level. 3. No abnormal marrow edema to suggest adjacent discitis/osteomyelitis, though as above, postcontrast images were not obtained. 4. Severe multilevel degenerative changes as above, not significantly changed since 06/21/2021. Electronically Signed   By: Valetta Mole M.D.   On: 09/16/2021 14:07   MR Lumbar Spine W Wo Contrast  Result Date: 10/05/2021 CLINICAL  DATA:  Low back pain, infection suspected, positive xray/CT psoas abscess, continued pain EXAM: MRI LUMBAR SPINE WITHOUT AND WITH CONTRAST TECHNIQUE: Multiplanar and multiecho pulse sequences of the lumbar spine were obtained without and with intravenous contrast. CONTRAST:  90m GADAVIST GADOBUTROL 1  MMOL/ML IV SOLN COMPARISON:  09/16/2021 FINDINGS: Segmentation: Numbering is kept consistent with the prior study with transitional anatomy at the lumbosacral junction and partial lumbarization of S1. Alignment:  Stable. Vertebrae: Stable vertebral body heights. Postsurgical changes of posterior decompression are again identified from L1-L2 through L5-S1. There is no new marrow edema. Conus medullaris and cauda equina: Conus extends to the L1-L2 level. Conus and cauda equina appear normal. No abnormal intrathecal enhancement. Paraspinal and other soft tissues: Postoperative changes. Left paraspinal edema and small peripherally enhancing collection identified within the psoas muscle at the L4 level. A second small peripherally enhancing collection is present in the left psoas muscle at the L5 level. Do not appear substantially changed the prior study. Disc levels: Similar disc edema at several levels. There is no associated enhancement. Degenerative changes are stable over the short interval. IMPRESSION: No substantial change in small left psoas abscesses/fluid collections. No evidence discitis/osteomyelitis. Electronically Signed   By: PMacy MisM.D.   On: 10/05/2021 17:55   MR PELVIS WO CONTRAST  Result Date: 09/16/2021 CLINICAL DATA:  Intramuscular gas within the left iliopsoas muscle on CT. Severe back pain. EXAM: MRI PELVIS WITHOUT CONTRAST TECHNIQUE: Multiplanar multisequence MR imaging of the pelvis was performed. No intravenous contrast was administered. Patient was unable to continue the examination. No contrast was administered. Lumbar spine findings are dictated separately. COMPARISON:  Abdominopelvic CT 09/15/2021 and 06/21/2021. FINDINGS: Bones/Joint/Cartilage Within the pelvis, there is no evidence of acute fracture or dislocation. There are mild degenerative changes of both hips. No evidence of avascular necrosis or significant hip joint effusion. The sacroiliac joints are intact with mild  degenerative changes on the left. There is no sacroiliac joint effusion or suspicious subchondral signal abnormality. There is multilevel lumbar spondylosis status post multilevel posterior decompression. Lumbar spine findings are dictated separately. Ligaments Not relevant for exam/indication. Muscles and Tendons Associated with the gas in the left iliopsoas muscle on recent CT is a small amount of muscular edema as well as a small fluid collection measuring up to 1.8 cm posterior to the muscle at the L5 level. No drainable fluid collection identified. Muscular edema extends into the pelvis without associated focal fluid collection. There is no significant fluid in the iliopsoas bursa. There is asymmetric fatty atrophy of the left gluteus, piriformis and hamstring muscles. No acute tendon tear identified. Soft tissues There is a chronic left adnexal mass measuring 3.7 cm on image 29/7. This demonstrates low T1 and T2 signal, corresponding with calcification on CT. This mass appears grossly unchanged from CTs dating back to 08/20/2013. As above, left paraspinal inflammatory changes and gas within the ileus psoas muscle. No other focal fluid collections are identified. IMPRESSION: 1. There is a small amount of muscular edema within the left iliopsoas muscle, associated with a small paraspinal fluid collection at the L5 level. See separate examination of the lumbar spine. 2. No intrapelvic fluid collections. 3. Mild degenerative changes of the hips and sacroiliac joints. 4. Stable partially calcified left adnexal mass from at least 2014, consistent with a benign finding. Electronically Signed   By: WRichardean SaleM.D.   On: 09/16/2021 13:50   CT Abdomen Pelvis W Contrast  Result Date: 09/15/2021 CLINICAL DATA:  75year old female  with acute LEFT abdominal and pelvic pain for 2 days. EXAM: CT ABDOMEN AND PELVIS WITH CONTRAST TECHNIQUE: Multidetector CT imaging of the abdomen and pelvis was performed using the  standard protocol following bolus administration of intravenous contrast. CONTRAST:  38m OMNIPAQUE IOHEXOL 300 MG/ML  SOLN COMPARISON:  06/21/2021 CT and prior studies FINDINGS: Lower chest: No acute abnormality Hepatobiliary: Mildly nodular hepatic contour or likely represent cirrhosis. No other hepatic abnormalities are noted. Patient is status post cholecystectomy. No biliary dilatation. Pancreas: Unremarkable Spleen: Unremarkable Adrenals/Urinary Tract: The kidneys, adrenal glands and bladder are unremarkable. No hydronephrosis or renal calculi. Stomach/Bowel: Scattered diverticula within the descending and sigmoid colon noted without evidence of acute diverticulitis. There is no evidence of bowel obstruction or focal collection. The appendix is normal. Vascular/Lymphatic: Aortic atherosclerosis. No enlarged abdominal or pelvic lymph nodes. Reproductive: No new abnormalities. A 4 cm LEFT adnexal mass appears unchanged from remote studies. Other: No ascites or pneumoperitoneum. Musculoskeletal: Again noted is moderate to severe multilevel degenerative disc disease, spondylosis and facet arthropathy throughout the lumbar spine with vacuum disc phenomenon. However, now noted are foci of gas within the LEFT psoas muscle which appears to extend from the L3-4 or L4-5 vacuum discs. No definite LEFT psoas focal collection or enhancement is identified. IMPRESSION: 1. New foci of gas within the LEFT psoas muscle which appears to extend from L3-4 or L4-5 degenerative vacuum discs. Although this may represent noninfectious extension of gas from the disc space, infection is not excluded although there is no definite evidence of focal collection/enhancement of the LEFT psoas muscle. Recommend MRI with and without contrast for further evaluation as clinically indicated. 2. Mildly nodular hepatic contour suspicious for cirrhosis. 3. Aortic Atherosclerosis (ICD10-I70.0). Electronically Signed   By: JMargarette CanadaM.D.   On:  09/15/2021 12:46   DG CHEST PORT 1 VIEW  Result Date: 10/03/2021 CLINICAL DATA:  Abdominal pain, nausea, vomiting and diarrhea. EXAM: PORTABLE CHEST 1 VIEW COMPARISON:  Portable chest 07/20/2021 FINDINGS: The heart enlarged but unchanged. No vascular congestion is seen. The aorta is tortuous patchy calcification and mild ectasia. Linear scarring or atelectasis again noted left mid field. The lungs are otherwise clear. No pleural effusion is seen. Advanced bilateral shoulder DJD. IMPRESSION: No evidence of acute chest disease or interval changes. Cardiomegaly. Stable chest. Electronically Signed   By: KTelford NabM.D.   On: 10/03/2021 20:32   [place] SITE RITE  Result Date: 10/06/2021 If Site Rite image not attached, placement could not be confirmed due to current cardiac rhythm.  [place]domen Limited RUQ (LIVER/GB)  Result Date: 09/16/2021 CLINICAL DATA:  Abdomen pain EXAM: ULTRASOUND ABDOMEN LIMITED RIGHT UPPER QUADRANT COMPARISON:  CT 09/15/2021. FINDINGS: Gallbladder: Status post cholecystectomy. Common bile duct: Diameter: 1.8 mm Liver: Slightly nodular hepatic contour. No focal hepatic abnormality. Liver is slightly echogenic. Portal vein is patent on color Doppler imaging with normal direction of blood flow towards the liver. Other: None. IMPRESSION: 1. Status post cholecystectomy without biliary dilatation 2. Slightly echogenic nodular liver suspicious for cirrhosis Electronically Signed   By: KDonavan FoilM.D.   On: 09/16/2021 19:53    Microbiology: Recent Results (from the past 240 hour(s))  Resp Panel by RT-PCR (Flu A&B, Covid) Nasopharyngeal Swab     Status: None   Collection Time: 10/03/21  6:50 PM   Specimen: Nasopharyngeal Swab; Nasopharyngeal(NP) swabs in vial transport medium  Result Value Ref Range Status   SARS Coronavirus 2 by RT PCR NEGATIVE NEGATIVE Final  Comment: (NOTE) SARS-CoV-2 target nucleic acids are NOT DETECTED.  The SARS-CoV-2 RNA is generally detectable  in upper respiratory specimens during the acute phase of infection. The lowest concentration of SARS-CoV-2 viral copies this assay can detect is 138 copies/mL. A negative result does not preclude SARS-Cov-2 infection and should not be used as the sole basis for treatment or other patient management decisions. A negative result may occur with  improper specimen collection/handling, submission of specimen other than nasopharyngeal swab, presence of viral mutation(s) within the areas targeted by this assay, and inadequate number of viral copies(<138 copies/mL). A negative result must be combined with clinical observations, patient history, and epidemiological information. The expected result is Negative.  Fact Sheet for Patients:  EntrepreneurPulse.com.au  Fact Sheet for Healthcare Providers:  IncredibleEmployment.be  This test is no t yet approved or cleared by the [place] FDA and  has been authorized for detection and/or diagnosis of SARS-CoV-2 by FDA under an Emergency Use Authorization (EUA). This EUA will remain  in effect (meaning this test can be used) for the duration of the COVID-19 declaration under Section 564(b)(1) of the Act, 21 U.S.C.section 360bbb-3(b)(1), unless the authorization is terminated  or revoked sooner.       Influenza A by PCR NEGATIVE NEGATIVE Final   Influenza B by PCR NEGATIVE NEGATIVE Final    Comment: (NOTE) The Xpert Xpress SARS-CoV-2/FLU/RSV plus assay is intended as an aid in the diagnosis of influenza from Nasopharyngeal swab specimens and should not be used as a sole basis for treatment. Nasal washings and aspirates are unacceptable for Xpert Xpress SARS-CoV-2/FLU/RSV testing.  Fact Sheet for Patients: EntrepreneurPulse.com.au  Fact Sheet for Healthcare Providers: IncredibleEmployment.be  This test is not yet approved or cleared by the [place] FDA and has  been authorized for detection and/or diagnosis of SARS-CoV-2 by FDA under an Emergency Use Authorization (EUA). This EUA will remain in effect (meaning this test can be used) for the duration of the COVID-19 declaration under Section 564(b)(1) of the Act, 21 U.S.C. section 360bbb-3(b)(1), unless the authorization is terminated or revoked.  Performed at [facility], [address] [place], [place] [address]   Culture, blood (routine x 2)     Status: None (Preliminary result)   Collection Time: 10/05/21  8:55 AM   Specimen: BLOOD RIGHT FOREARM  Result Value Ref Range Status   Specimen Description BLOOD RIGHT FOREARM  Final   Special Requests   Final    BOTTLES DRAWN AEROBIC AND ANAEROBIC Blood Culture adequate volume   Culture   Final    NO GROWTH 2 DAYS Performed at [facility], [address], [place], [place] [address]    Report Status PENDING  Incomplete  Culture, blood (routine x 2)     Status: None (Preliminary result)   Collection Time: 10/05/21  8:55 AM   Specimen: BLOOD LEFT FOREARM  Result Value Ref Range Status   Specimen Description BLOOD LEFT FOREARM  Final   Special Requests   Final    BOTTLES DRAWN AEROBIC AND ANAEROBIC Blood Culture adequate volume   Culture   Final    NO GROWTH 2 DAYS Performed at [facility], [place], [address] [place], [place] [address]    Report Status PENDING  Incomplete     Labs: Basic Metabolic Panel: Recent Labs  Lab 10/03/21 1657 10/04/21 0503 10/04/21 1708 10/05/21 0556 10/06/21 0710 10/07/21 0543  NA 141 141  --  138 140 140  K 3.3* 3.0* 3.9 3.3* 3.5 3.4*  CL 99 104  --  104 107 109  CO2 29 22  --  '23 22 23  ' GLUCOSE 106* 88  --  82 69* 77  BUN 27* 27*  --  23 12 7*  CREATININE 2.23* 2.28*  --  1.68* 0.97 0.87  CALCIUM 9.1 8.3*  --  7.9* 8.2* 8.2*  MG  --  1.3* 2.0  --  1.7 2.2   Liver Function Tests: Recent Labs  Lab 10/03/21 1657 10/04/21 0503  AST 30 23  ALT 25 21  ALKPHOS 98 78  BILITOT 1.0 1.0  PROT 7.1 5.8*   ALBUMIN 3.4* 2.9*   Recent Labs  Lab 10/03/21 1657  LIPASE 37   No results for input(s): AMMONIA in the last 168 hours. CBC: Recent Labs  Lab 10/03/21 1657 10/04/21 0503 10/06/21 0710  WBC 10.2 7.4 6.7  NEUTROABS  --  4.1  --   HGB 14.5 12.0 10.7*  HCT 43.9 36.9 33.0*  MCV 96.1 95.6 99.1  PLT 343 303 263   Cardiac Enzymes: No results for input(s): CKTOTAL, CKMB, CKMBINDEX, TROPONINI in the last 168 hours. BNP: Invalid input(s): POCBNP CBG: No results for input(s): GLUCAP in the last 168 hours.  Time coordinating discharge:  36 minutes  Signed:  Orson Eva, DO [facility] Pager: [phone] 10/07/2021, 2:11 PM

## 2021-10-08 DIAGNOSIS — I959 Hypotension, unspecified: Secondary | ICD-10-CM | POA: Diagnosis not present

## 2021-10-08 DIAGNOSIS — R1312 Dysphagia, oropharyngeal phase: Secondary | ICD-10-CM | POA: Diagnosis not present

## 2021-10-08 DIAGNOSIS — Z743 Need for continuous supervision: Secondary | ICD-10-CM | POA: Diagnosis not present

## 2021-10-08 DIAGNOSIS — I5022 Chronic systolic (congestive) heart failure: Secondary | ICD-10-CM | POA: Diagnosis not present

## 2021-10-08 DIAGNOSIS — R109 Unspecified abdominal pain: Secondary | ICD-10-CM | POA: Diagnosis not present

## 2021-10-08 DIAGNOSIS — I509 Heart failure, unspecified: Secondary | ICD-10-CM | POA: Diagnosis not present

## 2021-10-08 DIAGNOSIS — R2689 Other abnormalities of gait and mobility: Secondary | ICD-10-CM | POA: Diagnosis not present

## 2021-10-08 DIAGNOSIS — G9009 Other idiopathic peripheral autonomic neuropathy: Secondary | ICD-10-CM | POA: Diagnosis not present

## 2021-10-08 DIAGNOSIS — M549 Dorsalgia, unspecified: Secondary | ICD-10-CM | POA: Diagnosis not present

## 2021-10-08 DIAGNOSIS — Z0389 Encounter for observation for other suspected diseases and conditions ruled out: Secondary | ICD-10-CM | POA: Diagnosis not present

## 2021-10-08 DIAGNOSIS — R52 Pain, unspecified: Secondary | ICD-10-CM | POA: Diagnosis not present

## 2021-10-08 DIAGNOSIS — G061 Intraspinal abscess and granuloma: Secondary | ICD-10-CM | POA: Diagnosis not present

## 2021-10-08 DIAGNOSIS — F413 Other mixed anxiety disorders: Secondary | ICD-10-CM | POA: Diagnosis not present

## 2021-10-08 DIAGNOSIS — E785 Hyperlipidemia, unspecified: Secondary | ICD-10-CM | POA: Diagnosis not present

## 2021-10-08 DIAGNOSIS — M545 Low back pain, unspecified: Secondary | ICD-10-CM | POA: Diagnosis not present

## 2021-10-08 DIAGNOSIS — F5101 Primary insomnia: Secondary | ICD-10-CM | POA: Diagnosis not present

## 2021-10-08 DIAGNOSIS — G4700 Insomnia, unspecified: Secondary | ICD-10-CM | POA: Diagnosis not present

## 2021-10-08 DIAGNOSIS — M79605 Pain in left leg: Secondary | ICD-10-CM | POA: Diagnosis not present

## 2021-10-08 DIAGNOSIS — F32A Depression, unspecified: Secondary | ICD-10-CM | POA: Diagnosis not present

## 2021-10-08 DIAGNOSIS — I10 Essential (primary) hypertension: Secondary | ICD-10-CM | POA: Diagnosis not present

## 2021-10-08 DIAGNOSIS — G8929 Other chronic pain: Secondary | ICD-10-CM | POA: Diagnosis not present

## 2021-10-08 DIAGNOSIS — K6812 Psoas muscle abscess: Secondary | ICD-10-CM | POA: Diagnosis not present

## 2021-10-08 DIAGNOSIS — I5032 Chronic diastolic (congestive) heart failure: Secondary | ICD-10-CM | POA: Diagnosis not present

## 2021-10-08 DIAGNOSIS — I517 Cardiomegaly: Secondary | ICD-10-CM | POA: Diagnosis not present

## 2021-10-08 DIAGNOSIS — F411 Generalized anxiety disorder: Secondary | ICD-10-CM | POA: Diagnosis not present

## 2021-10-08 DIAGNOSIS — Z959 Presence of cardiac and vascular implant and graft, unspecified: Secondary | ICD-10-CM | POA: Diagnosis not present

## 2021-10-08 DIAGNOSIS — N179 Acute kidney failure, unspecified: Secondary | ICD-10-CM | POA: Diagnosis not present

## 2021-10-08 DIAGNOSIS — R5381 Other malaise: Secondary | ICD-10-CM | POA: Diagnosis not present

## 2021-10-08 DIAGNOSIS — M353 Polymyalgia rheumatica: Secondary | ICD-10-CM | POA: Diagnosis not present

## 2021-10-08 DIAGNOSIS — G894 Chronic pain syndrome: Secondary | ICD-10-CM | POA: Diagnosis not present

## 2021-10-08 DIAGNOSIS — E876 Hypokalemia: Secondary | ICD-10-CM | POA: Diagnosis not present

## 2021-10-08 DIAGNOSIS — S37099A Other injury of unspecified kidney, initial encounter: Secondary | ICD-10-CM | POA: Diagnosis not present

## 2021-10-08 DIAGNOSIS — F419 Anxiety disorder, unspecified: Secondary | ICD-10-CM | POA: Diagnosis not present

## 2021-10-08 DIAGNOSIS — M069 Rheumatoid arthritis, unspecified: Secondary | ICD-10-CM | POA: Diagnosis not present

## 2021-10-08 DIAGNOSIS — Z7401 Bed confinement status: Secondary | ICD-10-CM | POA: Diagnosis not present

## 2021-10-08 DIAGNOSIS — M2578 Osteophyte, vertebrae: Secondary | ICD-10-CM | POA: Diagnosis not present

## 2021-10-08 DIAGNOSIS — R279 Unspecified lack of coordination: Secondary | ICD-10-CM | POA: Diagnosis not present

## 2021-10-08 DIAGNOSIS — R112 Nausea with vomiting, unspecified: Secondary | ICD-10-CM | POA: Diagnosis not present

## 2021-10-08 DIAGNOSIS — R0902 Hypoxemia: Secondary | ICD-10-CM | POA: Diagnosis not present

## 2021-10-08 DIAGNOSIS — M6281 Muscle weakness (generalized): Secondary | ICD-10-CM | POA: Diagnosis not present

## 2021-10-08 DIAGNOSIS — I110 Hypertensive heart disease with heart failure: Secondary | ICD-10-CM | POA: Diagnosis not present

## 2021-10-08 DIAGNOSIS — Z792 Long term (current) use of antibiotics: Secondary | ICD-10-CM | POA: Diagnosis not present

## 2021-10-08 DIAGNOSIS — R079 Chest pain, unspecified: Secondary | ICD-10-CM | POA: Diagnosis not present

## 2021-10-08 NOTE — Discharge Summary (Signed)
Physician Discharge Summary  Moya Duan ZTI:[id number] DOB: [birth year] DOA: 10/03/2021  PCP: Jake Samples, PA-C  Admit date: 10/03/2021 Discharge date: 10/08/2021  Admitted From: Home Disposition:  SNF  Recommendations for Outpatient Follow-up:  Follow up with PCP in 1-2 weeks Please obtain BMP/CBC in one week Please remove PICC line after last dose of IV abx on 11/02/21 Please obtain CT abd/pelvis or MR lumbar spine 1 week before end of IV abx (first week in January 2023) See below regarding labs and instructions for vancomycin and ceftriaxone   Equipment/Devices:PICC placed 10/06/21 Discharge Condition: Stable CODE STATUS:FULL Diet recommendation: soft heart healthy   Brief/Interim Summary: 75 year old female with a history of diastolic CHF, hypertension, polymyalgia rheumatica on chronic steroids presenting with 2-day history of left lower quadrant abdominal pain with vomiting.   The patient has had 2 hospital admissions recently for abdominal pain with intractable nausea and vomiting.  She was initially admitted from 09/15/2021 to 09/11/2021 with similar symptoms.  She was diagnosed with a psoas abscess at that time.  It was felt the abscess was too small to be drained.  The patient was treated with IV antibiotics and discharged home with doxycycline.  She was admitted on 09/24/2021 to 09/29/2021 with nausea and poor oral intake and AKI.  Repeat CT of the abdomen pelvis at that time showed improvement of her psoas abscess.   Patient states that she was feeling well before the prior 3 weeks but then she was presenting with nightly episodes of nausea almost on a daily basis and regurgitation of food contents.  She has also presented with decreased appetite.  The patient also endorsed having some persistent pain in her lower abdomen, which is worsening her left lower quadrant.  Has noticed that her stools are more watery and have been more frequent than usual, up to 5 bowel  movements per day with some episode of nocturnal defecation. GI was consulted to assist with management. EGD was done on 12/14 which was normal.  It was felt patient likely has degree of gastric/esophageal dysmotility contributing there her n/v.  She was started on IVF for her AKI which improved.  It was felt that her left psoas abscess was causing some referred pain to her LLQ.  For that she was starting on IV abx and a PICC line was placed.  Discharge Diagnoses:  Acute kidney injury -Baseline creatinine 0.9-1.1 -serum creatinine peaked 2.28 -Secondary to volume depletion -Continue IV fluids>>improving -serum creatinine 0.87 on day of d/c   Intractable nausea/vomiting/abdominal pain -GI consult appreciated -10/04/2021 CT abd--gas and asymmetric soft tissue adjacent to the left L4-5 disc space similar to prior CT on 09/24/2021; no bowel wall thickening.  Stable partially calcified left pelvic mass -10/21/2021 CT abd--decreased size of gas and fluid collection adjacent to left L4-5 disc space. -12/14 EGD--normal esophagus, stomach, duodenum -Continue Protonix--increase to bid -no BM or emesis in last 48 hours -suspect a degree of gastric/esophageal dysmotility, exacerbated by acute medical illness -no diarrhea during hospitalization -Encouraged her to stay upright for at least 30 minutes after taking medication. Sit upright for at least 2-3 hours after meals.  -at time of d/c LLQ pain continues to improve and patient was able to tolerate diet -she will need GI follow up after d/c -zofran ac/hs x 3 days scheduled, then prn   Psoas abscess -The patient apparently completed antibiotics at the time of her discharge on 09/29/2021 -10/04/2021 CT abdomen--still shows gas and asymmetric soft tissue adjacent to the left L4-5 -  Blood cultures x2 sets--neg to date -Restart antibiotics IV after cultures -12/14--MR lumbar spine--No substantial change in small left psoas abscesses/fluid collections. No  evidence discitis/osteomyelitis. -ESR 75 (60) -CRP 1.8 (0.8) -given persistent abd pain, persistent radiographic evidence of abscess and increased inflammatory markers, continue IV abx -plan IV vanc/ceftriaxone x 28 days--last day 11/02/21 -obtain CT abd/pelvis or MR Lumbar spine one week before end of IV abx to clarify need for continued abx   Chronic diastolic CHF -Clinically hypovolemic -07/21/2019 echo EF 65-70; no WMA, trivial pericardial effusion, normal RV -restart torsemide after d/c   Hypokalemia/hypomagnesemia -Repleted   Rectal bleeding -Anusol per GI service   Mixed hyperlipidemia -Continue statin   Morbid Obesity -BMI 44.32 -lifestyle modification    Discharge Instructions  Discharge Instructions     [facility] pharmacist to adjust dose for Vancomycin, Aminoglycosides and other anti-infective therapies as requested by physician.   Complete by: As directed    [facility] to provide Cath Flo 68m   Complete by: As directed    Administer for PICC line occlusion and as ordered by physician for other access device issues.   Anaphylaxis Kit: Provided to treat any anaphylactic reaction to the medication being provided to the patient if First Dose or when requested by physician   Complete by: As directed    Epinephrine 1475mml vial / amp: Administer 0.75m27m0.75ml62mubcutaneously once for moderate to severe anaphylaxis, nurse to call physician and pharmacy when reaction occurs and call 911 if needed for immediate care   Diphenhydramine 50mg62mIV vial: Administer 25-50mg 71mM PRN for first dose reaction, rash, itching, mild reaction, nurse to call physician and pharmacy when reaction occurs   Sodium Chloride 0.9% NS 500ml I10mdminister if needed for hypovolemic blood pressure drop or as ordered by physician after call to physician with anaphylactic reaction   Change dressing on IV access line weekly and PRN   Complete by: As directed    Flush IV  access with Sodium Chloride 0.9% and Heparin 10 units/ml or 100 units/ml   Complete by: As directed    Home infusion instructions - [facility]   Complete by: As directed    Instructions: Flush IV access with Sodium Chloride 0.9% and Heparin 10units/ml or 100units/ml   Change dressing on IV access line: Weekly and PRN   Instructions Cath Flo 2mg: Ad87mister for PICC Line occlusion and as ordered by physician for other access device   [facility] pharmacist to adjust dose for: Vancomycin, Aminoglycosides and other anti-infective therapies as requested by physician   Method of administration may be changed at the discretion of home infusion pharmacist based upon assessment of the patient and/or caregivers ability to self-administer the medication ordered   Complete by: As directed       Allergies as of 10/08/2021       Reactions   Oxycontin [oxycodone Hcl] Swelling   Pt tolerates hydromorphone.   Sulfa Antibiotics Other (See Comments)   Sores   Tramadol    "Makes me feel like I am falling"   Penicillins Rash   Sulfasalazine Other (See Comments)   Sores        Medication List     TAKE these medications    acetaminophen 325 MG tablet Commonly known as: TYLENOL Take 2 tablets (650 mg total) by mouth every 6 (six) hours as needed for mild pain or headache (or Fever >/= 101).   ALPRAZolam 0.25 MG tablet Commonly known as: XANAXDuanne Moron  Take 1 tablet (0.25 mg total) by mouth daily as needed. What changed: when to take this   atorvastatin 20 MG tablet Commonly known as: LIPITOR Take 20 mg by mouth daily.   calcitRIOL 0.25 MCG capsule Commonly known as: ROCALTROL Take 0.25 mcg by mouth See admin instructions. Take 0.25 every Mon, Wed, Fri.   cefTRIAXone  IVPB Commonly known as: ROCEPHIN Inject 2 g into the vein daily for 26 days. Indication:  abscess First Dose: Yes Last Day of Therapy:  11/02/2021 Labs - Once weekly:  CBC/D and BMP, Labs - Every other  week:  ESR and CRP Method of administration: IV Push Method of administration may be changed at the discretion of home infusion pharmacist based upon assessment of the patient and/or caregiver's ability to self-administer the medication ordered.   cyclobenzaprine 5 MG tablet Commonly known as: FLEXERIL Take 5 mg by mouth every 12 (twelve) hours as needed for muscle spasms.   diclofenac Sodium 1 % Gel Commonly known as: VOLTAREN Apply 2 g topically 4 (four) times daily as needed (pain).   GAS-X PO Take 1 tablet by mouth 3 (three) times daily as needed (flatulence).   lactose free nutrition Liqd Take 237 mLs by mouth 3 (three) times daily between meals.   leflunomide 10 MG tablet Commonly known as: ARAVA TAKE (1) TABLET BY MOUTH DAILY. What changed: See the new instructions.   lidocaine 5 % Commonly known as: LIDODERM Place 1-2 patches onto the skin every 12 (twelve) hours as needed (pain).   melatonin 3 MG Tabs tablet Take 3 mg by mouth at bedtime.   metolazone 5 MG tablet Commonly known as: ZAROXOLYN Take 5 mg by mouth 2 (two) times a week. Tuesday and Fridays   metoprolol succinate 25 MG 24 hr tablet Commonly known as: Toprol XL Take 1 tablet (25 mg total) by mouth daily.   MULTIVITAMIN PO Take 1 tablet by mouth daily.   Nucynta ER 200 MG Tb12 Generic drug: Tapentadol HCl Take 100 mg by mouth every 12 (twelve) hours.   ondansetron 4 MG tablet Commonly known as: ZOFRAN Take 4 mg by mouth every 8 (eight) hours as needed for nausea or vomiting. What changed: Another medication with the same name was added. Make sure you understand how and when to take each.   ondansetron 4 MG tablet Commonly known as: ZOFRAN Take 1 tablet (4 mg total) by mouth 4 (four) times daily -  before meals and at bedtime. X 3 days What changed: You were already taking a medication with the same name, and this prescription was added. Make sure you understand how and when to take each.    pantoprazole 40 MG tablet Commonly known as: PROTONIX Take 1 tablet (40 mg total) by mouth 2 (two) times daily. What changed: when to take this   potassium chloride 10 MEQ tablet Commonly known as: KLOR-CON Take 1 tablet (10 mEq total) by mouth 2 (two) times daily. While taking torsemide   predniSONE 1 MG tablet Commonly known as: DELTASONE Take 9 tablets (9 mg total) by mouth daily with breakfast.   Torsemide 40 MG Tabs Take 40 mg by mouth 2 (two) times daily.   traZODone 50 MG tablet Commonly known as: DESYREL Take 25 mg by mouth at bedtime.   trolamine salicylate 10 % cream Commonly known as: ASPERCREME Apply 1 application topically as needed for muscle pain.   vancomycin  IVPB Inject 1,250 mg into the vein daily for 26 days. Indication:  abscess First Dose: Yes Last Day of Therapy:  11/02/2021 Labs - _0 /16/22 1318            Contact information for after-discharge care     [facility] .   Service: Skilled Nursing Contact information: [address] [address] [phone]                    Allergies  Allergen Reactions   Oxycontin [Oxycodone Hcl] Swelling    Pt tolerates hydromorphone.   Sulfa Antibiotics Other (See Comments)    Sores   Tramadol     "Makes me feel like I am falling"   Penicillins Rash    Sulfasalazine Other (See Comments)    Sores    Consultations: GI   Procedures/Studies: CT ABDOMEN PELVIS WO CONTRAST  Result Date: 10/03/2021 CLINICAL DATA:  Left lower quadrant abdominal pain EXAM: CT ABDOMEN AND PELVIS WITHOUT CONTRAST TECHNIQUE: Multidetector CT imaging of the abdomen and pelvis was performed following the standard protocol without IV contrast. COMPARISON:  CT abdomen and pelvis dated September 24, 2021 FINDINGS: Lower chest: Bibasilar atelectasis.  Coronary artery calcifications. Hepatobiliary: Mildly nodular liver contour. No focal liver abnormality is seen. Status post cholecystectomy. No biliary dilatation. Pancreas: Unremarkable. No pancreatic ductal dilatation or surrounding inflammatory changes. Spleen: Normal in size without focal abnormality. Adrenals/Urinary Tract: Adrenal glands are unremarkable. Kidneys are normal, without renal calculi, focal lesion, or hydronephrosis. Bladder is unremarkable. Stomach/Bowel: Stomach is within normal limits. Appendix appears normal. Diverticulosis. No evidence of bowel wall thickening, distention, or inflammatory changes. Vascular/Lymphatic: Aortic atherosclerosis. No enlarged abdominal or pelvic lymph nodes. Reproductive: Stable partially calcified left pelvic mass. Other: Small fat containing umbilical hernia. No abdominopelvic ascites. Musculoskeletal: Gas and asymmetric soft tissue adjacent to the left L4-L5 disc space is similar in appearance when compared with December 3rd 2022 prior. IMPRESSION: 1. No acute findings in the abdomen or pelvis. 2. Gas and asymmetric soft tissue adjacent to the left L4-L5 disc space is similar in appearance when compared with December 3rd 2022 prior. 3. Stable calcified left pelvic mass. 4. Coronary artery calcifications and aortic Atherosclerosis (ICD10-I70.0). Electronically Signed   By: Yetta Glassman M.D.   On: 10/03/2021 20:36   CT ABDOMEN PELVIS WO CONTRAST  Result Date: 09/24/2021 CLINICAL  DATA:  Abdominal abscess. EXAM: CT ABDOMEN AND PELVIS WITHOUT CONTRAST TECHNIQUE: Multidetector CT imaging of the abdomen and pelvis was performed following the standard protocol without IV contrast. COMPARISON:  September 15, 2021 FINDINGS: Lower chest: Atelectasis versus scarring in the left lung base. Calcific atherosclerotic disease of the aorta and coronary arteries. Hepatobiliary: No focal liver abnormality is seen. Status post cholecystectomy. No biliary dilatation. Pancreas: Unremarkable. No pancreatic ductal dilatation or surrounding inflammatory changes. Spleen: Normal  in size without focal abnormality. Adrenals/Urinary Tract: Adrenal glands are unremarkable. Kidneys are normal, without renal calculi, focal lesion, or hydronephrosis. Bladder is unremarkable. Stomach/Bowel: Stomach is within normal limits. Appendix appears normal. No evidence of bowel wall thickening, distention, or inflammatory changes. Vascular/Lymphatic: Aortic atherosclerosis. No enlarged abdominal or pelvic lymph nodes. Reproductive: Stable partially calcified left pelvic mass. Normal appearance of the right ovary and uterus. Other: No abdominal wall hernia or abnormality. No abdominopelvic ascites. Musculoskeletal: Marked arthritic changes of the lumbosacral spine. Scoliosis. Prior lumbar spine decompensation. Decreased in size gas and fluid collection adjacent to left L4-L5 disc space and extending into the left psoas muscle. Prominent left-sided L4-L5 disc bulge. IMPRESSION: 1. Decreased in size known gas and fluid collection adjacent to left L4-L5 disc space, extending into the left psoas muscle. 2. Stable partially calcified left pelvic mass. 3. Calcific atherosclerotic disease of the aorta and coronary arteries. Aortic Atherosclerosis (ICD10-I70.0). Electronically Signed   By: Fidela Salisbury M.D.   On: 09/24/2021 18:13   MR LUMBAR SPINE WO CONTRAST  Result Date: 09/16/2021 CLINICAL DATA:  Back pain common gas seen in  psoas muscle on recent CT EXAM: MRI LUMBAR SPINE WITHOUT CONTRAST TECHNIQUE: Multiplanar, multisequence MR imaging of the lumbar spine was performed. No intravenous contrast was administered. COMPARISON:  CT abdomen/pelvis dated 1 day prior, lumbar spine MRI 06/21/2021 FINDINGS: Segmentation: There is transitional anatomy with lumbarization of the S1 vertebral body. For the purposes of this report, there is a rudimentary S1-S2 disc space. Alignment:  There is trace retrolisthesis of L1 on L2 and L2 on L3. Vertebrae: There are postsurgical changes reflecting posterior decompression from L2 through L5. Vertebral body heights are preserved. There is multilevel degenerative endplate marrow signal abnormality throughout the lumbar spine. There is no suspicious marrow signal abnormality. There is no marrow edema to suggest infection. There is no definite evidence of septic arthritis. Conus medullaris and cauda equina: Conus extends to the L1-L2 level. Conus and cauda equina appear normal. Paraspinal and other soft tissues: There is a 2.0 cm x 0.8 cm by 1.5 cm fluid collection in the left psoas muscle at the L5 level. There may be an additional smaller abscess measuring up to 0.8 cm more superiorly at the L4 level (7-15, 11-18). The paraspinal soft tissues are otherwise unremarkable. Disc levels: There is multilevel intervertebral disc space narrowing. Marked T2 hypointensity in the anterior aspect of the L3-L4 and L4-L5 disc spaces is consistent with vacuum disc phenomenon. There is advanced multilevel facet arthropathy with prominent effusions at L4-L5 and L5-S1, similar to the prior study. T12-L1: Is a mild disc bulge and ligamentum flavum thickening resulting in mild spinal canal stenosis and mild-to-moderate bilateral neural foraminal stenosis. L1-L2: Status post posterior decompression. There is a prominent diffuse disc bulge, degenerative endplate change, and bilateral facet arthropathy resulting in mild spinal  canal stenosis and severe bilateral neural foraminal stenosis, unchanged. L2-L3: Status post posterior decompression. There is a mild disc bulge, degenerative endplate change, and bilateral facet arthropathy resulting in severe bilateral neural foraminal stenosis without significant spinal canal stenosis, unchanged. L3-L4: Status post posterior decompression. There is a prominent diffuse disc bulge, degenerative endplate change, and bilateral facet arthropathy resulting in severe bilateral neural foraminal stenosis without significant spinal canal stenosis, unchanged. L4-L5: Status post posterior decompression. There is a prominent diffuse disc bulge, degenerative endplate change, and bilateral facet arthropathy resulting in mild spinal canal stenosis and severe bilateral neural foraminal stenosis, unchanged. L5-S1: There is a diffuse disc bulge with  a left subarticular zone annular fissure, degenerative endplate change, and bilateral facet arthropathy resulting in severe left worse than right neural foraminal stenosis without significant spinal canal stenosis, unchanged. IMPRESSION: 1. Postcontrast images were not obtained as the patient could not complete the study. Within this confines: 2. 2.0 cm fluid collection in the left psoas muscle at the L5 level concerning for abscess with a possible additional smaller abscess at the L4 level. 3. No abnormal marrow edema to suggest adjacent discitis/osteomyelitis, though as above, postcontrast images were not obtained. 4. Severe multilevel degenerative changes as above, not significantly changed since 06/21/2021. Electronically Signed   By: Valetta Mole M.D.   On: 09/16/2021 14:07   MR Lumbar Spine W Wo Contrast  Result Date: 10/05/2021 CLINICAL DATA:  Low back pain, infection suspected, positive xray/CT psoas abscess, continued pain EXAM: MRI LUMBAR SPINE WITHOUT AND WITH CONTRAST TECHNIQUE: Multiplanar and multiecho pulse sequences of the lumbar spine were obtained  without and with intravenous contrast. CONTRAST:  81m GADAVIST GADOBUTROL 1 MMOL/ML IV SOLN COMPARISON:  09/16/2021 FINDINGS: Segmentation: Numbering is kept consistent with the prior study with transitional anatomy at the lumbosacral junction and partial lumbarization of S1. Alignment:  Stable. Vertebrae: Stable vertebral body heights. Postsurgical changes of posterior decompression are again identified from L1-L2 through L5-S1. There is no new marrow edema. Conus medullaris and cauda equina: Conus extends to the L1-L2 level. Conus and cauda equina appear normal. No abnormal intrathecal enhancement. Paraspinal and other soft tissues: Postoperative changes. Left paraspinal edema and small peripherally enhancing collection identified within the psoas muscle at the L4 level. A second small peripherally enhancing collection is present in the left psoas muscle at the L5 level. Do not appear substantially changed the prior study. Disc levels: Similar disc edema at several levels. There is no associated enhancement. Degenerative changes are stable over the short interval. IMPRESSION: No substantial change in small left psoas abscesses/fluid collections. No evidence discitis/osteomyelitis. Electronically Signed   By: PMacy MisM.D.   On: 10/05/2021 17:55   MR PELVIS WO CONTRAST  Result Date: 09/16/2021 CLINICAL DATA:  Intramuscular gas within the left iliopsoas muscle on CT. Severe back pain. EXAM: MRI PELVIS WITHOUT CONTRAST TECHNIQUE: Multiplanar multisequence MR imaging of the pelvis was performed. No intravenous contrast was administered. Patient was unable to continue the examination. No contrast was administered. Lumbar spine findings are dictated separately. COMPARISON:  Abdominopelvic CT 09/15/2021 and 06/21/2021. FINDINGS: Bones/Joint/Cartilage Within the pelvis, there is no evidence of acute fracture or dislocation. There are mild degenerative changes of both hips. No evidence of avascular necrosis or  significant hip joint effusion. The sacroiliac joints are intact with mild degenerative changes on the left. There is no sacroiliac joint effusion or suspicious subchondral signal abnormality. There is multilevel lumbar spondylosis status post multilevel posterior decompression. Lumbar spine findings are dictated separately. Ligaments Not relevant for exam/indication. Muscles and Tendons Associated with the gas in the left iliopsoas muscle on recent CT is a small amount of muscular edema as well as a small fluid collection measuring up to 1.8 cm posterior to the muscle at the L5 level. No drainable fluid collection identified. Muscular edema extends into the pelvis without associated focal fluid collection. There is no significant fluid in the iliopsoas bursa. There is asymmetric fatty atrophy of the left gluteus, piriformis and hamstring muscles. No acute tendon tear identified. Soft tissues There is a chronic left adnexal mass measuring 3.7 cm on image 29/7. This demonstrates low T1 and T2 signal,  corresponding with calcification on CT. This mass appears grossly unchanged from CTs dating back to 08/20/2013. As above, left paraspinal inflammatory changes and gas within the ileus psoas muscle. No other focal fluid collections are identified. IMPRESSION: 1. There is a small amount of muscular edema within the left iliopsoas muscle, associated with a small paraspinal fluid collection at the L5 level. See separate examination of the lumbar spine. 2. No intrapelvic fluid collections. 3. Mild degenerative changes of the hips and sacroiliac joints. 4. Stable partially calcified left adnexal mass from at least 2014, consistent with a benign finding. Electronically Signed   By: Richardean Sale M.D.   On: 09/16/2021 13:50   CT Abdomen Pelvis W Contrast  Result Date: 09/15/2021 CLINICAL DATA:  75 year old female with acute LEFT abdominal and pelvic pain for 2 days. EXAM: CT ABDOMEN AND PELVIS WITH CONTRAST TECHNIQUE:  Multidetector CT imaging of the abdomen and pelvis was performed using the standard protocol following bolus administration of intravenous contrast. CONTRAST:  69m OMNIPAQUE IOHEXOL 300 MG/ML  SOLN COMPARISON:  06/21/2021 CT and prior studies FINDINGS: Lower chest: No acute abnormality Hepatobiliary: Mildly nodular hepatic contour or likely represent cirrhosis. No other hepatic abnormalities are noted. Patient is status post cholecystectomy. No biliary dilatation. Pancreas: Unremarkable Spleen: Unremarkable Adrenals/Urinary Tract: The kidneys, adrenal glands and bladder are unremarkable. No hydronephrosis or renal calculi. Stomach/Bowel: Scattered diverticula within the descending and sigmoid colon noted without evidence of acute diverticulitis. There is no evidence of bowel obstruction or focal collection. The appendix is normal. Vascular/Lymphatic: Aortic atherosclerosis. No enlarged abdominal or pelvic lymph nodes. Reproductive: No new abnormalities. A 4 cm LEFT adnexal mass appears unchanged from remote studies. Other: No ascites or pneumoperitoneum. Musculoskeletal: Again noted is moderate to severe multilevel degenerative disc disease, spondylosis and facet arthropathy throughout the lumbar spine with vacuum disc phenomenon. However, now noted are foci of gas within the LEFT psoas muscle which appears to extend from the L3-4 or L4-5 vacuum discs. No definite LEFT psoas focal collection or enhancement is identified. IMPRESSION: 1. New foci of gas within the LEFT psoas muscle which appears to extend from L3-4 or L4-5 degenerative vacuum discs. Although this may represent noninfectious extension of gas from the disc space, infection is not excluded although there is no definite evidence of focal collection/enhancement of the LEFT psoas muscle. Recommend MRI with and without contrast for further evaluation as clinically indicated. 2. Mildly nodular hepatic contour suspicious for cirrhosis. 3. Aortic  Atherosclerosis (ICD10-I70.0). Electronically Signed   By: JMargarette CanadaM.D.   On: 09/15/2021 12:46   DG CHEST PORT 1 VIEW  Result Date: 10/03/2021 CLINICAL DATA:  Abdominal pain, nausea, vomiting and diarrhea. EXAM: PORTABLE CHEST 1 VIEW COMPARISON:  Portable chest 07/20/2021 FINDINGS: The heart enlarged but unchanged. No vascular congestion is seen. The aorta is tortuous patchy calcification and mild ectasia. Linear scarring or atelectasis again noted left mid field. The lungs are otherwise clear. No pleural effusion is seen. Advanced bilateral shoulder DJD. IMPRESSION: No evidence of acute chest disease or interval changes. Cardiomegaly. Stable chest. Electronically Signed   By: KTelford NabM.D.   On: 10/03/2021 20:32   [place] SITE RITE  Result Date: 10/06/2021 If Site Rite image not attached, placement could not be confirmed due to current cardiac rhythm.  [place]dom[place] Limited RUQ (LIVER/GB)  Result Date: 09/16/2021 CLINICAL DATA:  Abdomen pain EXAM: ULTRASOUND ABDOMEN LIMITED RIGHT UPPER QUADRANT COMPARISON:  CT 09/15/2021. FINDINGS: Gallbladder: Status post cholecystectomy. Common bile duct: Diameter:  1.8 mm Liver: Slightly nodular hepatic contour. No focal hepatic abnormality. Liver is slightly echogenic. Portal vein is patent on color Doppler imaging with normal direction of blood flow towards the liver. Other: None. IMPRESSION: 1. Status post cholecystectomy without biliary dilatation 2. Slightly echogenic nodular liver suspicious for cirrhosis Electronically Signed   By: Donavan Foil M.D.   On: 09/16/2021 19:53        Discharge Exam: Vitals:   10/08/21 0424 10/08/21 0907  BP: 106/64 112/76  Pulse: 91 (!) 103  Resp: 16   Temp: 97.8 F (36.6 C)   SpO2: 97%    Vitals:   10/07/21 1242 10/07/21 2046 10/08/21 0424 10/08/21 0907  BP: 115/64 118/71 106/64 112/76  Pulse: 88 86 91 (!) 103  Resp: _0 Temp: 98.1 F (36.7 C) 98 F (36.7 C) 97.8 F (36.6 C)   TempSrc: Oral   Oral   SpO2: 94% 96% 97%   Weight:   106 kg   Height:        General: Pt is alert, awake, not in acute distress Cardiovascular: RRR, S1/S2 +, no rubs, no gallops Respiratory: bibasilar crackles. No wheeze Abdominal: Soft, minimal LLQ pain, ND, bowel sounds + Extremities: no edema, no cyanosis   The results of significant diagnostics from this hospitalization (including imaging, microbiology, ancillary and laboratory) are listed below for reference.    Significant Diagnostic Studies: CT ABDOMEN PELVIS WO CONTRAST  Result Date: 10/03/2021 CLINICAL DATA:  Left lower quadrant abdominal pain EXAM: CT ABDOMEN AND PELVIS WITHOUT CONTRAST TECHNIQUE: Multidetector CT imaging of the abdomen and pelvis was performed following the standard protocol without IV contrast. COMPARISON:  CT abdomen and pelvis dated September 24, 2021 FINDINGS: Lower chest: Bibasilar atelectasis.  Coronary artery calcifications. Hepatobiliary: Mildly nodular liver contour. No focal liver abnormality is seen. Status post cholecystectomy. No biliary dilatation. Pancreas: Unremarkable. No pancreatic ductal dilatation or surrounding inflammatory changes. Spleen: Normal in size without focal abnormality. Adrenals/Urinary Tract: Adrenal glands are unremarkable. Kidneys are normal, without renal calculi, focal lesion, or hydronephrosis. Bladder is unremarkable. Stomach/Bowel: Stomach is within normal limits. Appendix appears normal. Diverticulosis. No evidence of bowel wall thickening, distention, or inflammatory changes. Vascular/Lymphatic: Aortic atherosclerosis. No enlarged abdominal or pelvic lymph nodes. Reproductive: Stable partially calcified left pelvic mass. Other: Small fat containing umbilical hernia. No abdominopelvic ascites. Musculoskeletal: Gas and asymmetric soft tissue adjacent to the left L4-L5 disc space is similar in appearance when compared with December 3rd 2022 prior. IMPRESSION: 1. No acute findings in the abdomen or  pelvis. 2. Gas and asymmetric soft tissue adjacent to the left L4-L5 disc space is similar in appearance when compared with December 3rd 2022 prior. 3. Stable calcified left pelvic mass. 4. Coronary artery calcifications and aortic Atherosclerosis (ICD10-I70.0). Electronically Signed   By: Yetta Glassman M.D.   On: 10/03/2021 20:36   CT ABDOMEN PELVIS WO CONTRAST  Result Date: 09/24/2021 CLINICAL DATA:  Abdominal abscess. EXAM: CT ABDOMEN AND PELVIS WITHOUT CONTRAST TECHNIQUE: Multidetector CT imaging of the abdomen and pelvis was performed following the standard protocol without IV contrast. COMPARISON:  September 15, 2021 FINDINGS: Lower chest: Atelectasis versus scarring in the left lung base. Calcific atherosclerotic disease of the aorta and coronary arteries. Hepatobiliary: No focal liver abnormality is seen. Status post cholecystectomy. No biliary dilatation. Pancreas: Unremarkable. No pancreatic ductal dilatation or surrounding inflammatory changes. Spleen: Normal in size without focal abnormality. Adrenals/Urinary Tract: Adrenal glands are unremarkable. Kidneys are normal, without renal calculi, focal lesion,  or hydronephrosis. Bladder is unremarkable. Stomach/Bowel: Stomach is within normal limits. Appendix appears normal. No evidence of bowel wall thickening, distention, or inflammatory changes. Vascular/Lymphatic: Aortic atherosclerosis. No enlarged abdominal or pelvic lymph nodes. Reproductive: Stable partially calcified left pelvic mass. Normal appearance of the right ovary and uterus. Other: No abdominal wall hernia or abnormality. No abdominopelvic ascites. Musculoskeletal: Marked arthritic changes of the lumbosacral spine. Scoliosis. Prior lumbar spine decompensation. Decreased in size gas and fluid collection adjacent to left L4-L5 disc space and extending into the left psoas muscle. Prominent left-sided L4-L5 disc bulge. IMPRESSION: 1. Decreased in size known gas and fluid collection adjacent  to left L4-L5 disc space, extending into the left psoas muscle. 2. Stable partially calcified left pelvic mass. 3. Calcific atherosclerotic disease of the aorta and coronary arteries. Aortic Atherosclerosis (ICD10-I70.0). Electronically Signed   By: Fidela Salisbury M.D.   On: 09/24/2021 18:13   MR LUMBAR SPINE WO CONTRAST  Result Date: 09/16/2021 CLINICAL DATA:  Back pain common gas seen in psoas muscle on recent CT EXAM: MRI LUMBAR SPINE WITHOUT CONTRAST TECHNIQUE: Multiplanar, multisequence MR imaging of the lumbar spine was performed. No intravenous contrast was administered. COMPARISON:  CT abdomen/pelvis dated 1 day prior, lumbar spine MRI 06/21/2021 FINDINGS: Segmentation: There is transitional anatomy with lumbarization of the S1 vertebral body. For the purposes of this report, there is a rudimentary S1-S2 disc space. Alignment:  There is trace retrolisthesis of L1 on L2 and L2 on L3. Vertebrae: There are postsurgical changes reflecting posterior decompression from L2 through L5. Vertebral body heights are preserved. There is multilevel degenerative endplate marrow signal abnormality throughout the lumbar spine. There is no suspicious marrow signal abnormality. There is no marrow edema to suggest infection. There is no definite evidence of septic arthritis. Conus medullaris and cauda equina: Conus extends to the L1-L2 level. Conus and cauda equina appear normal. Paraspinal and other soft tissues: There is a 2.0 cm x 0.8 cm by 1.5 cm fluid collection in the left psoas muscle at the L5 level. There may be an additional smaller abscess measuring up to 0.8 cm more superiorly at the L4 level (7-15, 11-18). The paraspinal soft tissues are otherwise unremarkable. Disc levels: There is multilevel intervertebral disc space narrowing. Marked T2 hypointensity in the anterior aspect of the L3-L4 and L4-L5 disc spaces is consistent with vacuum disc phenomenon. There is advanced multilevel facet arthropathy with  prominent effusions at L4-L5 and L5-S1, similar to the prior study. T12-L1: Is a mild disc bulge and ligamentum flavum thickening resulting in mild spinal canal stenosis and mild-to-moderate bilateral neural foraminal stenosis. L1-L2: Status post posterior decompression. There is a prominent diffuse disc bulge, degenerative endplate change, and bilateral facet arthropathy resulting in mild spinal canal stenosis and severe bilateral neural foraminal stenosis, unchanged. L2-L3: Status post posterior decompression. There is a mild disc bulge, degenerative endplate change, and bilateral facet arthropathy resulting in severe bilateral neural foraminal stenosis without significant spinal canal stenosis, unchanged. L3-L4: Status post posterior decompression. There is a prominent diffuse disc bulge, degenerative endplate change, and bilateral facet arthropathy resulting in severe bilateral neural foraminal stenosis without significant spinal canal stenosis, unchanged. L4-L5: Status post posterior decompression. There is a prominent diffuse disc bulge, degenerative endplate change, and bilateral facet arthropathy resulting in mild spinal canal stenosis and severe bilateral neural foraminal stenosis, unchanged. L5-S1: There is a diffuse disc bulge with a left subarticular zone annular fissure, degenerative endplate change, and bilateral facet arthropathy resulting in severe left worse than  right neural foraminal stenosis without significant spinal canal stenosis, unchanged. IMPRESSION: 1. Postcontrast images were not obtained as the patient could not complete the study. Within this confines: 2. 2.0 cm fluid collection in the left psoas muscle at the L5 level concerning for abscess with a possible additional smaller abscess at the L4 level. 3. No abnormal marrow edema to suggest adjacent discitis/osteomyelitis, though as above, postcontrast images were not obtained. 4. Severe multilevel degenerative changes as above, not  significantly changed since 06/21/2021. Electronically Signed   By: Valetta Mole M.D.   On: 09/16/2021 14:07   MR Lumbar Spine W Wo Contrast  Result Date: 10/05/2021 CLINICAL DATA:  Low back pain, infection suspected, positive xray/CT psoas abscess, continued pain EXAM: MRI LUMBAR SPINE WITHOUT AND WITH CONTRAST TECHNIQUE: Multiplanar and multiecho pulse sequences of the lumbar spine were obtained without and with intravenous contrast. CONTRAST:  77m GADAVIST GADOBUTROL 1 MMOL/ML IV SOLN COMPARISON:  09/16/2021 FINDINGS: Segmentation: Numbering is kept consistent with the prior study with transitional anatomy at the lumbosacral junction and partial lumbarization of S1. Alignment:  Stable. Vertebrae: Stable vertebral body heights. Postsurgical changes of posterior decompression are again identified from L1-L2 through L5-S1. There is no new marrow edema. Conus medullaris and cauda equina: Conus extends to the L1-L2 level. Conus and cauda equina appear normal. No abnormal intrathecal enhancement. Paraspinal and other soft tissues: Postoperative changes. Left paraspinal edema and small peripherally enhancing collection identified within the psoas muscle at the L4 level. A second small peripherally enhancing collection is present in the left psoas muscle at the L5 level. Do not appear substantially changed the prior study. Disc levels: Similar disc edema at several levels. There is no associated enhancement. Degenerative changes are stable over the short interval. IMPRESSION: No substantial change in small left psoas abscesses/fluid collections. No evidence discitis/osteomyelitis. Electronically Signed   By: PMacy MisM.D.   On: 10/05/2021 17:55   MR PELVIS WO CONTRAST  Result Date: 09/16/2021 CLINICAL DATA:  Intramuscular gas within the left iliopsoas muscle on CT. Severe back pain. EXAM: MRI PELVIS WITHOUT CONTRAST TECHNIQUE: Multiplanar multisequence MR imaging of the pelvis was performed. No  intravenous contrast was administered. Patient was unable to continue the examination. No contrast was administered. Lumbar spine findings are dictated separately. COMPARISON:  Abdominopelvic CT 09/15/2021 and 06/21/2021. FINDINGS: Bones/Joint/Cartilage Within the pelvis, there is no evidence of acute fracture or dislocation. There are mild degenerative changes of both hips. No evidence of avascular necrosis or significant hip joint effusion. The sacroiliac joints are intact with mild degenerative changes on the left. There is no sacroiliac joint effusion or suspicious subchondral signal abnormality. There is multilevel lumbar spondylosis status post multilevel posterior decompression. Lumbar spine findings are dictated separately. Ligaments Not relevant for exam/indication. Muscles and Tendons Associated with the gas in the left iliopsoas muscle on recent CT is a small amount of muscular edema as well as a small fluid collection measuring up to 1.8 cm posterior to the muscle at the L5 level. No drainable fluid collection identified. Muscular edema extends into the pelvis without associated focal fluid collection. There is no significant fluid in the iliopsoas bursa. There is asymmetric fatty atrophy of the left gluteus, piriformis and hamstring muscles. No acute tendon tear identified. Soft tissues There is a chronic left adnexal mass measuring 3.7 cm on image 29/7. This demonstrates low T1 and T2 signal, corresponding with calcification on CT. This mass appears grossly unchanged from CTs dating back to 08/20/2013. As above, left  paraspinal inflammatory changes and gas within the ileus psoas muscle. No other focal fluid collections are identified. IMPRESSION: 1. There is a small amount of muscular edema within the left iliopsoas muscle, associated with a small paraspinal fluid collection at the L5 level. See separate examination of the lumbar spine. 2. No intrapelvic fluid collections. 3. Mild degenerative changes  of the hips and sacroiliac joints. 4. Stable partially calcified left adnexal mass from at least 2014, consistent with a benign finding. Electronically Signed   By: Richardean Sale M.D.   On: 09/16/2021 13:50   CT Abdomen Pelvis W Contrast  Result Date: 09/15/2021 CLINICAL DATA:  75 year old female with acute LEFT abdominal and pelvic pain for 2 days. EXAM: CT ABDOMEN AND PELVIS WITH CONTRAST TECHNIQUE: Multidetector CT imaging of the abdomen and pelvis was performed using the standard protocol following bolus administration of intravenous contrast. CONTRAST:  32m OMNIPAQUE IOHEXOL 300 MG/ML  SOLN COMPARISON:  06/21/2021 CT and prior studies FINDINGS: Lower chest: No acute abnormality Hepatobiliary: Mildly nodular hepatic contour or likely represent cirrhosis. No other hepatic abnormalities are noted. Patient is status post cholecystectomy. No biliary dilatation. Pancreas: Unremarkable Spleen: Unremarkable Adrenals/Urinary Tract: The kidneys, adrenal glands and bladder are unremarkable. No hydronephrosis or renal calculi. Stomach/Bowel: Scattered diverticula within the descending and sigmoid colon noted without evidence of acute diverticulitis. There is no evidence of bowel obstruction or focal collection. The appendix is normal. Vascular/Lymphatic: Aortic atherosclerosis. No enlarged abdominal or pelvic lymph nodes. Reproductive: No new abnormalities. A 4 cm LEFT adnexal mass appears unchanged from remote studies. Other: No ascites or pneumoperitoneum. Musculoskeletal: Again noted is moderate to severe multilevel degenerative disc disease, spondylosis and facet arthropathy throughout the lumbar spine with vacuum disc phenomenon. However, now noted are foci of gas within the LEFT psoas muscle which appears to extend from the L3-4 or L4-5 vacuum discs. No definite LEFT psoas focal collection or enhancement is identified. IMPRESSION: 1. New foci of gas within the LEFT psoas muscle which appears to extend from  L3-4 or L4-5 degenerative vacuum discs. Although this may represent noninfectious extension of gas from the disc space, infection is not excluded although there is no definite evidence of focal collection/enhancement of the LEFT psoas muscle. Recommend MRI with and without contrast for further evaluation as clinically indicated. 2. Mildly nodular hepatic contour suspicious for cirrhosis. 3. Aortic Atherosclerosis (ICD10-I70.0). Electronically Signed   By: JMargarette CanadaM.D.   On: 09/15/2021 12:46   DG CHEST PORT 1 VIEW  Result Date: 10/03/2021 CLINICAL DATA:  Abdominal pain, nausea, vomiting and diarrhea. EXAM: PORTABLE CHEST 1 VIEW COMPARISON:  Portable chest 07/20/2021 FINDINGS: The heart enlarged but unchanged. No vascular congestion is seen. The aorta is tortuous patchy calcification and mild ectasia. Linear scarring or atelectasis again noted left mid field. The lungs are otherwise clear. No pleural effusion is seen. Advanced bilateral shoulder DJD. IMPRESSION: No evidence of acute chest disease or interval changes. Cardiomegaly. Stable chest. Electronically Signed   By: KTelford NabM.D.   On: 10/03/2021 20:32   [place] SITE RITE  Result Date: 10/06/2021 If Site Rite image not attached, placement could not be confirmed due to current cardiac rhythm.  [place]domen Limited RUQ (LIVER/GB)  Result Date: 09/16/2021 CLINICAL DATA:  Abdomen pain EXAM: ULTRASOUND ABDOMEN LIMITED RIGHT UPPER QUADRANT COMPARISON:  CT 09/15/2021. FINDINGS: Gallbladder: Status post cholecystectomy. Common bile duct: Diameter: 1.8 mm Liver: Slightly nodular hepatic contour. No focal hepatic abnormality. Liver is slightly echogenic. Portal vein is patent  on color Doppler imaging with normal direction of blood flow towards the liver. Other: None. IMPRESSION: 1. Status post cholecystectomy without biliary dilatation 2. Slightly echogenic nodular liver suspicious for cirrhosis Electronically Signed   By: Donavan Foil M.D.   On:  09/16/2021 19:53    Microbiology: Recent Results (from the past 240 hour(s))  Resp Panel by RT-PCR (Flu A&B, Covid) Nasopharyngeal Swab     Status: None   Collection Time: 10/03/21  6:50 PM   Specimen: Nasopharyngeal Swab; Nasopharyngeal(NP) swabs in vial transport medium  Result Value Ref Range Status   SARS Coronavirus 2 by RT PCR NEGATIVE NEGATIVE Final    Comment: (NOTE) SARS-CoV-2 target nucleic acids are NOT DETECTED.  The SARS-CoV-2 RNA is generally detectable in upper respiratory specimens during the acute phase of infection. The lowest concentration of SARS-CoV-2 viral copies this assay can detect is 138 copies/mL. A negative result does not preclude SARS-Cov-2 infection and should not be used as the sole basis for treatment or other patient management decisions. A negative result may occur with  improper specimen collection/handling, submission of specimen other than nasopharyngeal swab, presence of viral mutation(s) within the areas targeted by this assay, and inadequate number of viral copies(<138 copies/mL). A negative result must be combined with clinical observations, patient history, and epidemiological information. The expected result is Negative.  Fact Sheet for Patients:  EntrepreneurPulse.com.au  Fact Sheet for Healthcare Providers:  IncredibleEmployment.be  This test is no t yet approved or cleared by the [place] FDA and  has been authorized for detection and/or diagnosis of SARS-CoV-2 by FDA under an Emergency Use Authorization (EUA). This EUA will remain  in effect (meaning this test can be used) for the duration of the COVID-19 declaration under Section 564(b)(1) of the Act, 21 U.S.C.section 360bbb-3(b)(1), unless the authorization is terminated  or revoked sooner.       Influenza A by PCR NEGATIVE NEGATIVE Final   Influenza B by PCR NEGATIVE NEGATIVE Final    Comment: (NOTE) The Xpert Xpress  SARS-CoV-2/FLU/RSV plus assay is intended as an aid in the diagnosis of influenza from Nasopharyngeal swab specimens and should not be used as a sole basis for treatment. Nasal washings and aspirates are unacceptable for Xpert Xpress SARS-CoV-2/FLU/RSV testing.  Fact Sheet for Patients: EntrepreneurPulse.com.au  Fact Sheet for Healthcare Providers: IncredibleEmployment.be  This test is not yet approved or cleared by the [place] FDA and has been authorized for detection and/or diagnosis of SARS-CoV-2 by FDA under an Emergency Use Authorization (EUA). This EUA will remain in effect (meaning this test can be used) for the duration of the COVID-19 declaration under Section 564(b)(1) of the Act, 21 U.S.C. section 360bbb-3(b)(1), unless the authorization is terminated or revoked.  Performed at [facility], [address], [place], [place] [address]   Culture, blood (routine x 2)     Status: None (Preliminary result)   Collection Time: 10/05/21  8:55 AM   Specimen: BLOOD RIGHT FOREARM  Result Value Ref Range Status   Specimen Description BLOOD RIGHT FOREARM  Final   Special Requests   Final    BOTTLES DRAWN AEROBIC AND ANAEROBIC Blood Culture adequate volume   Culture   Final    NO GROWTH 3 DAYS Performed at [facility], [address], [place], [place] [address]    Report Status PENDING  Incomplete  Culture, blood (routine x 2)     Status: None (Preliminary result)   Collection Time: 10/05/21  8:55 AM   Specimen:  BLOOD LEFT FOREARM  Result Value Ref Range Status   Specimen Description BLOOD LEFT FOREARM  Final   Special Requests   Final    BOTTLES DRAWN AEROBIC AND ANAEROBIC Blood Culture adequate volume   Culture   Final    NO GROWTH 3 DAYS Performed at [facility] [address] [place], [place] [address]    Report Status PENDING  Incomplete  Resp Panel by RT-PCR (Flu A&B, Covid) Nasopharyngeal Swab     Status: None   Collection  Time: 10/07/21  2:11 PM   Specimen: Nasopharyngeal Swab; Nasopharyngeal(NP) swabs in vial transport medium  Result Value Ref Range Status   SARS Coronavirus 2 by RT PCR NEGATIVE NEGATIVE Final    Comment: (NOTE) SARS-CoV-2 target nucleic acids are NOT DETECTED.  The SARS-CoV-2 RNA is generally detectable in upper respiratory specimens during the acute phase of infection. The lowest concentration of SARS-CoV-2 viral copies this assay can detect is 138 copies/mL. A negative result does not preclude SARS-Cov-2 infection and should not be used as the sole basis for treatment or other patient management decisions. A negative result may occur with  improper specimen collection/handling, submission of specimen other than nasopharyngeal swab, presence of viral mutation(s) within the areas targeted by this assay, and inadequate number of viral copies(<138 copies/mL). A negative result must be combined with clinical observations, patient history, and epidemiological information. The expected result is Negative.  Fact Sheet for Patients:  EntrepreneurPulse.com.au  Fact Sheet for Healthcare Providers:  IncredibleEmployment.be  This test is no t yet approved or cleared by the [place] FDA and  has been authorized for detection and/or diagnosis of SARS-CoV-2 by FDA under an Emergency Use Authorization (EUA). This EUA will remain  in effect (meaning this test can be used) for the duration of the COVID-19 declaration under Section 564(b)(1) of the Act, 21 U.S.C.section 360bbb-3(b)(1), unless the authorization is terminated  or revoked sooner.       Influenza A by PCR NEGATIVE NEGATIVE Final   Influenza B by PCR NEGATIVE NEGATIVE Final    Comment: (NOTE) The Xpert Xpress SARS-CoV-2/FLU/RSV plus assay is intended as an aid in the diagnosis of influenza from Nasopharyngeal swab specimens and should not be used as a sole basis for treatment. Nasal washings  and aspirates are unacceptable for Xpert Xpress SARS-CoV-2/FLU/RSV testing.  Fact Sheet for Patients: EntrepreneurPulse.com.au  Fact Sheet for Healthcare Providers: IncredibleEmployment.be  This test is not yet approved or cleared by the [place] FDA and has been authorized for detection and/or diagnosis of SARS-CoV-2 by FDA under an Emergency Use Authorization (EUA). This EUA will remain in effect (meaning this test can be used) for the duration of the COVID-19 declaration under Section 564(b)(1) of the Act, 21 U.S.C. section 360bbb-3(b)(1), unless the authorization is terminated or revoked.  Performed at [facility], [address] [place], [place] [address]      Labs: Basic Metabolic Panel: Recent Labs  Lab 10/03/21 1657 10/04/21 0503 10/04/21 1708 10/05/21 0556 10/06/21 0710 10/07/21 0543  NA 141 141  --  138 140 140  K 3.3* 3.0* 3.9 3.3* 3.5 3.4*  CL 99 104  --  104 107 109  CO2 29 22  --  _0 GLUCOSE 106* 88  --  82 69* 77  BUN 27* 27*  --  23 12 7*  CREATININE 2.23* 2.28*  --  1.68* 0.97 0.87  CALCIUM 9.1 8.3*  --  7.9* 8.2* 8.2*  MG  --  1.3* 2.0  --  1.7 2.2    Liver Function Tests: Recent Labs  Lab 10/03/21 1657 10/04/21 0503  AST 30 23  ALT 25 21  ALKPHOS 98 78  BILITOT 1.0 1.0  PROT 7.1 5.8*  ALBUMIN 3.4* 2.9*    Recent Labs  Lab 10/03/21 1657  LIPASE 37    No results for input(s): AMMONIA in the last 168 hours. CBC: Recent Labs  Lab 10/03/21 1657 10/04/21 0503 10/06/21 0710  WBC 10.2 7.4 6.7  NEUTROABS  --  4.1  --   HGB 14.5 12.0 10.7*  HCT 43.9 36.9 33.0*  MCV 96.1 95.6 99.1  PLT 343 303 263    Cardiac Enzymes: No results for input(s): CKTOTAL, CKMB, CKMBINDEX, TROPONINI in the last 168 hours. BNP: Invalid input(s): POCBNP CBG: No results for input(s): GLUCAP in the last 168 hours.  Time coordinating discharge:  36 minutes  Signed:  Orson Eva, DO [facility] Pager: [phone] 10/08/2021, 11:15 AM

## 2021-10-08 NOTE — Progress Notes (Signed)
Nicole [name], M.D. Gastroenterology & Hepatology   Interval History:  No acute events overnight Has not presented any more diarrhea. Feeling better. States she was able to tolerate some more food yesterday. No nausea, vomiting, fever or chills. Abdominal pain slightly better but still feels soreness.  Inpatient Medications:  Current Facility-Administered Medications:    0.9 %  sodium chloride infusion, , Intravenous, Continuous, Mariel Aloe, MD, Last Rate: 75 mL/hr at 10/06/21 1814, New Bag at 10/06/21 1814   acetaminophen (TYLENOL) tablet 650 mg, 650 mg, Oral, Q6H PRN, 650 mg at 10/07/21 2206 **OR** acetaminophen (TYLENOL) suppository 650 mg, 650 mg, Rectal, Q6H PRN, Zierle-Ghosh, Asia B, DO   ALPRAZolam (XANAX) tablet 0.25 mg, 0.25 mg, Oral, BID PRN, Zierle-Ghosh, Asia B, DO, 0.25 mg at 10/07/21 2227   atorvastatin (LIPITOR) tablet 20 mg, 20 mg, Oral, Daily, Zierle-Ghosh, Asia B, DO, 20 mg at 10/08/21 0906   calcitRIOL (ROCALTROL) capsule 0.25 mcg, 0.25 mcg, Oral, Once per day on Mon Wed Fri, Zierle-Ghosh, [place] B, DO, 0.25 mcg at 10/07/21 1010   cefTRIAXone (ROCEPHIN) 2 g in sodium chloride 0.9 % 100 mL IVPB, 2 g, Intravenous, Q24H, Tat, Shanon Brow, MD, Last Rate: 200 mL/hr at 10/08/21 0917, 2 g at 10/08/21 6203   Chlorhexidine Gluconate Cloth 2 % PADS 6 each, 6 each, Topical, Daily, Tat, David, MD, 6 each at 10/08/21 0914   heparin injection 5,000 Units, 5,000 Units, Subcutaneous, Q8H, Zierle-Ghosh, Asia B, DO, 5,000 Units at 10/08/21 0614   hydrocortisone (ANUSOL-HC) 2.5 % rectal cream 1 application, 1 application, Rectal, BID, Jodi Mourning, Kristen S, PA-C, 1 application at 55/97/41 0915   melatonin tablet 6 mg, 6 mg, Oral, QHS, Zierle-Ghosh, Asia B, DO, 6 mg at 10/07/21 2209   metoprolol succinate (TOPROL-XL) 24 hr tablet 25 mg, 25 mg, Oral, Daily, Zierle-Ghosh, Asia B, DO, 25 mg at 10/08/21 0907   morphine 2 MG/ML injection 2 mg, 2 mg, Intravenous, Q2H PRN, Zierle-Ghosh, Asia B, DO, 2 mg at  10/08/21 0957   nystatin (MYCOSTATIN/NYSTOP) topical powder, , Topical, BID, Zierle-Ghosh, Asia B, DO, Given at 10/08/21 0918   ondansetron (ZOFRAN) injection 4 mg, 4 mg, Intravenous, Q6H PRN, Tat, David, MD, 4 mg at 10/08/21 0613   ondansetron (ZOFRAN) tablet 4 mg, 4 mg, Oral, TID AC & HS, Mahala Menghini, PA-C, 4 mg at 10/07/21 2207   pantoprazole (PROTONIX) EC tablet 40 mg, 40 mg, Oral, Daily, Harper, Kristen S, PA-C, 40 mg at 10/08/21 6384   promethazine (PHENERGAN) tablet 12.5 mg, 12.5 mg, Oral, Q6H PRN, 12.5 mg at 10/04/21 0339 **OR** promethazine (PHENERGAN) 12.5 mg in sodium chloride 0.9 % 50 mL IVPB, 12.5 mg, Intravenous, Q6H PRN, Zierle-Ghosh, Asia B, DO, Last Rate: 200 mL/hr at 10/07/21 0205, 12.5 mg at 10/07/21 0205   sodium chloride flush (NS) 0.9 % injection 10-40 mL, 10-40 mL, Intracatheter, Q12H, Tat, David, MD, 10 mL at 10/08/21 0918   sodium chloride flush (NS) 0.9 % injection 10-40 mL, 10-40 mL, Intracatheter, PRN, Tat, David, MD   traZODone (DESYREL) tablet 25 mg, 25 mg, Oral, QHS, Zierle-Ghosh, Asia B, DO, 25 mg at 10/07/21 2209   vancomycin (VANCOREADY) IVPB 1250 mg/250 mL, 1,250 mg, Intravenous, Q24H, Tat, David, MD, Last Rate: 166.7 mL/hr at 10/07/21 1127, 1,250 mg at 10/07/21 1127   I/O    Intake/Output Summary (Last 24 hours) at 10/08/2021 1028 Last data filed at 10/08/2021 0900 Gross per 24 hour  Intake 1613.8 ml  Output --  Net 1613.8 ml  Physical Exam: Temp:  [97.8 F (36.6 C)-98.1 F (36.7 C)] 97.8 F (36.6 C) (12/17 0424) Pulse Rate:  [86-103] 103 (12/17 0907) Resp:  [16-19] 16 (12/17 0424) BP: (106-118)/(64-76) 112/76 (12/17 0907) SpO2:  [94 %-97 %] 97 % (12/17 0424) Weight:  [106 kg] 106 kg (12/17 0424)  Temp (24hrs), Avg:98 F (36.7 C), Min:97.8 F (36.6 C), Max:98.1 F (36.7 C) GENERAL: The patient is AO x3, in no acute distress. Obese. HEENT: Head is normocephalic and atraumatic. EOMI are intact. Mouth is well hydrated and without  lesions. NECK: Supple. No masses LUNGS: Clear to auscultation. No presence of rhonchi/wheezing/rales. Adequate chest expansion HEART: RRR, normal s1 and s2. ABDOMEN: mildly tender in LLQ, no guarding, no peritoneal signs, and nondistended. BS +. No masses. EXTREMITIES: Without any cyanosis, clubbing, rash, lesions or edema. NEUROLOGIC: AOx3, no focal motor deficit. SKIN: no jaundice, no rashes  Laboratory Data: CBC:     Component Value Date/Time   WBC 6.7 10/06/2021 0710   RBC 3.33 (L) 10/06/2021 0710   HGB 10.7 (L) 10/06/2021 0710   HGB 12.7 10/30/2019 1249   HCT 33.0 (L) 10/06/2021 0710   HCT 38.2 10/30/2019 1249   PLT 263 10/06/2021 0710   PLT 359 10/30/2019 1249   MCV 99.1 10/06/2021 0710   MCV 90 10/30/2019 1249   MCH 32.1 10/06/2021 0710   MCHC 32.4 10/06/2021 0710   RDW 14.6 10/06/2021 0710   RDW 14.5 10/30/2019 1249   LYMPHSABS 2.0 10/04/2021 0503   LYMPHSABS 2.5 10/30/2019 1249   MONOABS 0.9 10/04/2021 0503   EOSABS 0.3 10/04/2021 0503   EOSABS 0.2 10/30/2019 1249   BASOSABS 0.1 10/04/2021 0503   BASOSABS 0.1 10/30/2019 1249   COAG:  Lab Results  Component Value Date   INR 1.0 10/04/2021   INR 1.0 07/21/2021   INR 0.9 07/20/2021    BMP:  BMP Latest Ref Rng & Units 10/07/2021 10/06/2021 10/05/2021  Glucose 70 - 99 mg/dL 77 69(L) 82  BUN 8 - 23 mg/dL 7(L) 12 23  Creatinine 0.44 - 1.00 mg/dL 0.87 0.97 1.68(H)  BUN/Creat Ratio 6 - 22 (calc) - - -  Sodium 135 - 145 mmol/L 140 140 138  Potassium 3.5 - 5.1 mmol/L 3.4(L) 3.5 3.3(L)  Chloride 98 - 111 mmol/L 109 107 104  CO2 22 - 32 mmol/L '23 22 23  ' Calcium 8.9 - 10.3 mg/dL 8.2(L) 8.2(L) 7.9(L)    HEPATIC:  Hepatic Function Latest Ref Rng & Units 10/04/2021 10/03/2021 09/24/2021  Total Protein 6.5 - 8.1 g/dL 5.8(L) 7.1 6.3(L)  Albumin 3.5 - 5.0 g/dL 2.9(L) 3.4(L) 3.2(L)  AST 15 - 41 U/L '23 30 23  ' ALT 0 - 44 U/L '21 25 19  ' Alk Phosphatase 38 - 126 U/L 78 98 85  Total Bilirubin 0.3 - 1.2 mg/dL 1.0 1.0 0.9   Bilirubin, Direct 0.0 - 0.2 mg/dL - - 0.1    CARDIAC:  Lab Results  Component Value Date   CKTOTAL 93 01/14/2018   CKMB 2.8 10/26/2008   TROPONINI <0.30 08/20/2013      Imaging: I personally reviewed and interpreted the available labs, imaging and endoscopic files.   Assessment/Plan: 75 year old female with history of polymyalgia rheumatica on chronic steroids, hypertension, chronic diastolic heart failure, collagen vascular disease who presented to the ED with abdominal pain, vomiting, diarrhea. Patient has presented symptoms of nausea and abdominal pain. Investigations so far have shown presence of persistent collection in the left psoas area on MRI which could explain her  pain and nausea. Abdominal imaging and EGD which were otherwise unremarkable to explain her persistent symptoms. Currently on IV antibiotic management for abscess, will defer management of this collection to primary team, possibly need to discuss with ID.  Fortunately nausea has been better with scheduled Zofran, which she should continue until nausea subsides and then use as needed.   Can consider esophageal manometry as outpatient to evaluate dysphagia.  No more episodes of diarrhea, no need for stool testing at this point.  GI service will sign-off, please call us back if you have any more questions.  Nicole Peppers, MD Gastroenterology and Hepatology [facility]

## 2021-10-09 DIAGNOSIS — S37099A Other injury of unspecified kidney, initial encounter: Secondary | ICD-10-CM | POA: Diagnosis not present

## 2021-10-09 DIAGNOSIS — E876 Hypokalemia: Secondary | ICD-10-CM | POA: Diagnosis not present

## 2021-10-09 DIAGNOSIS — I5022 Chronic systolic (congestive) heart failure: Secondary | ICD-10-CM | POA: Diagnosis not present

## 2021-10-09 DIAGNOSIS — K6812 Psoas muscle abscess: Secondary | ICD-10-CM | POA: Diagnosis not present

## 2021-10-09 DIAGNOSIS — F411 Generalized anxiety disorder: Secondary | ICD-10-CM | POA: Diagnosis not present

## 2021-10-10 DIAGNOSIS — F411 Generalized anxiety disorder: Secondary | ICD-10-CM | POA: Diagnosis not present

## 2021-10-10 DIAGNOSIS — G894 Chronic pain syndrome: Secondary | ICD-10-CM | POA: Diagnosis not present

## 2021-10-10 LAB — CULTURE, BLOOD (ROUTINE X 2)
Culture: NO GROWTH
Culture: NO GROWTH
Special Requests: ADEQUATE
Special Requests: ADEQUATE

## 2021-10-12 ENCOUNTER — Encounter (INDEPENDENT_AMBULATORY_CARE_PROVIDER_SITE_OTHER): Payer: Self-pay | Admitting: *Deleted

## 2021-10-19 DIAGNOSIS — M549 Dorsalgia, unspecified: Secondary | ICD-10-CM | POA: Diagnosis not present

## 2021-10-19 DIAGNOSIS — K6812 Psoas muscle abscess: Secondary | ICD-10-CM | POA: Diagnosis not present

## 2021-10-19 DIAGNOSIS — M545 Low back pain, unspecified: Secondary | ICD-10-CM | POA: Diagnosis not present

## 2021-10-19 DIAGNOSIS — M79605 Pain in left leg: Secondary | ICD-10-CM | POA: Diagnosis not present

## 2021-10-19 DIAGNOSIS — I110 Hypertensive heart disease with heart failure: Secondary | ICD-10-CM | POA: Diagnosis not present

## 2021-10-19 DIAGNOSIS — R079 Chest pain, unspecified: Secondary | ICD-10-CM | POA: Diagnosis not present

## 2021-10-19 DIAGNOSIS — R109 Unspecified abdominal pain: Secondary | ICD-10-CM | POA: Diagnosis not present

## 2021-10-19 DIAGNOSIS — I509 Heart failure, unspecified: Secondary | ICD-10-CM | POA: Diagnosis not present

## 2021-10-19 DIAGNOSIS — I517 Cardiomegaly: Secondary | ICD-10-CM | POA: Diagnosis not present

## 2021-10-19 DIAGNOSIS — E785 Hyperlipidemia, unspecified: Secondary | ICD-10-CM | POA: Diagnosis not present

## 2021-10-19 DIAGNOSIS — M2578 Osteophyte, vertebrae: Secondary | ICD-10-CM | POA: Diagnosis not present

## 2021-10-20 DIAGNOSIS — Z0389 Encounter for observation for other suspected diseases and conditions ruled out: Secondary | ICD-10-CM | POA: Diagnosis not present

## 2021-10-20 DIAGNOSIS — G061 Intraspinal abscess and granuloma: Secondary | ICD-10-CM | POA: Diagnosis not present

## 2021-10-25 ENCOUNTER — Telehealth (INDEPENDENT_AMBULATORY_CARE_PROVIDER_SITE_OTHER): Payer: Self-pay

## 2021-10-25 NOTE — Telephone Encounter (Signed)
Nance Pew Rn at [facility] called stating the patient was seen by Dr. Jenetta Downer while in the hospital. She was prescribed two IV antibiotics Vancomycin and Rocephin for 26 days by a Dr. Carles Collet (hospitalist) for a PSOAS abscess. She says she reached out to him,but he told her he was just the hospitalist and he could not help her to discontinue the orders. Patient is due to have them both until 11/02/2021, but patient son is wanting to remove his mother from their facility on 10/26/2021 or 10/27/2021, and they need to have this discontinued before this can happen. Jeral Fruit states the patient had a MRI on 10/14/2021 that showed her PSOAS abscess was resolved and this is why she was taking these antibiotics. They want to know if you would discontinue the orders for the vancomycin and the Rocephin. Please advise. Jody Gallamore [phone] ext [phone].

## 2021-10-25 NOTE — Telephone Encounter (Signed)
Unfortunately this is not my area of expertise. This should be evaluated by her PCP, an infectious disease doctor or an orthopedics doctor

## 2021-10-26 NOTE — Telephone Encounter (Signed)
I called and left a message on voice mail of Nicole [name], asked that she please return call to me at the office.

## 2021-10-26 NOTE — Telephone Encounter (Signed)
I spoke with Nicole [name] she is aware we will not be able to assist with the discontinuation of the IV antibiotics, to check with Pcp, ID doctor or orthopedics.

## 2021-10-28 DIAGNOSIS — F32A Depression, unspecified: Secondary | ICD-10-CM | POA: Diagnosis not present

## 2021-10-28 DIAGNOSIS — F5101 Primary insomnia: Secondary | ICD-10-CM | POA: Diagnosis not present

## 2021-10-28 DIAGNOSIS — F413 Other mixed anxiety disorders: Secondary | ICD-10-CM | POA: Diagnosis not present

## 2021-11-05 DIAGNOSIS — R2689 Other abnormalities of gait and mobility: Secondary | ICD-10-CM | POA: Diagnosis not present

## 2021-11-05 DIAGNOSIS — K6812 Psoas muscle abscess: Secondary | ICD-10-CM | POA: Diagnosis not present

## 2021-11-05 DIAGNOSIS — M6281 Muscle weakness (generalized): Secondary | ICD-10-CM | POA: Diagnosis not present

## 2021-11-08 ENCOUNTER — Telehealth: Payer: Self-pay

## 2021-11-08 NOTE — Telephone Encounter (Signed)
Patient's son Nicole [name] called stating his mom is in a skilled nursing facility at [facility] in [place].  He is requesting a new referral to be sent so Dalma can have physical therapy.  Nicole [name] states it is a 45 min drive to come to the office and would like to schedule a virtual appointment for his mom.

## 2021-11-09 NOTE — Telephone Encounter (Signed)
Okay to schedule virtual visit.

## 2021-11-11 DIAGNOSIS — F5101 Primary insomnia: Secondary | ICD-10-CM | POA: Diagnosis not present

## 2021-11-11 DIAGNOSIS — M6281 Muscle weakness (generalized): Secondary | ICD-10-CM | POA: Diagnosis not present

## 2021-11-11 DIAGNOSIS — K6812 Psoas muscle abscess: Secondary | ICD-10-CM | POA: Diagnosis not present

## 2021-11-11 DIAGNOSIS — F413 Other mixed anxiety disorders: Secondary | ICD-10-CM | POA: Diagnosis not present

## 2021-11-11 DIAGNOSIS — F32A Depression, unspecified: Secondary | ICD-10-CM | POA: Diagnosis not present

## 2021-11-11 DIAGNOSIS — R2689 Other abnormalities of gait and mobility: Secondary | ICD-10-CM | POA: Diagnosis not present

## 2021-11-11 NOTE — Progress Notes (Signed)
Virtual Visit via Telephone Note Location: Patient: Rehab center in [place]  Provider: in Office   I discussed the limitations, risks, security and privacy concerns of performing an evaluation and management service by telephone and the availability of in person appointments. I also discussed with the patient that there may be a patient responsible charge related to this service. The patient expressed understanding and agreed to proceed.  She was doing well at the last visit without any synovitis.  I connected with [name] on 11/15/21 at  4:00 PM EST by telephone and verified that I am speaking with the correct person using two identifiers.  Patient's last office visit was on May 12, 2021.  She has history of rheumatoid arthritis, PMR, possible temporal arteritis and osteoarthritis.  CC: Discuss recent hospitalizations  History of Present Illness: Patient is a 76 year old female with a past medical history of PMR, RA, and temporal arteritis.  She has had several hospitalizations since her last office visit.  Her most recent hospitalization was for management of an iliopsoas abscess. She is currently at [facility] rehab center.  The patient's son, [name], provided most of the patients history.  The patient continues to have significant weakness.  According to the patient's son she could not perform in the physical therapy earlier while she was on antibiotics.  But now after her nutrition is better she is able to do more and he would like for her to be in physical therapy.  Her son thinks she will benefit from working with physical therapy.     She has been holding kevzara  for the past 2 months.  According to the patient's son the infection has completely cleared.    Activities of Daily Living:  Patient reports morning stiffness for all day.  Patient reports nocturnal pain.  Difficulty dressing/grooming: reports Difficulty climbing stairs: Reports Difficulty getting out of chair:  Reports Difficulty using hands for taps, buttons, cutlery, and/or writing: Reports  Review of Systems  Constitutional:  Positive for malaise/fatigue and weight loss.  HENT:  Negative for ear pain and tinnitus.   Eyes:  Negative for pain and redness.  Respiratory:  Negative for cough and shortness of breath.   Cardiovascular:  Negative for chest pain and palpitations.  Gastrointestinal:  Negative for constipation and diarrhea.  Genitourinary:  Negative for frequency and urgency.  Musculoskeletal:  Positive for joint pain. Negative for myalgias.  Skin:  Negative for rash.  Neurological:  Positive for headaches. Negative for dizziness.  Endo/Heme/Allergies:  Bruises/bleeds easily.  Psychiatric/Behavioral:  The patient has insomnia.      Observations/Objective: October 06, 2021 WBC 6.7, hemoglobin 10.7, platelets 263, CMP creatinine 0.87, calcium 8.2, LFTs normal, ESR 75, CRP 1.8, vitamin D 59.22, PTH 110   The patient's son carlos provided most of the history.    Assessment and Plan:  Visit Diagnoses: Rheumatoid arthritis involving multiple sites with positive rheumatoid factor (HCC)-positive RF, positive anti-CCP.  Patient had no synovitis at the last visit on May 12, 2021.  She was previously on [place] and arava as combination therapy, but she has been off of therapy for the past 2 months due to recent hospitalizations.  She was diagnosed with iliopsoas abscess after presenting to the ED on 10/19/2021.  She has been admitted to the [facility] rehabilitation center in [place] since then where she was receiving IV antibiotics.  According to the patient's son the infection has completely cleared. Discussed that she will not be a good candidate  for taking immunosuppressive agents due to her history of recurrent infections requiring hospitalization as well as still remaining in a rehabilitation center. She will NOT restart on kevzara and should discontinue arava at this time.  She will  remain on prednisone 9 mg daily. We recommend physical therapy to try to improve her strength and mobility.  If patient cannot come here for the follow-up visits she may continue prednisone through her PCP.   High risk medication use -She will remain on prednisone 9 mg by mouth daily. She will discontinue Kevzara and arava due to recurrent infections requiring hospitalization.     Polymyalgia rheumatica (HCC)-She has ongoing muscular weakness and tenderness. She remains on prednisone 9 mg daily.  She will benefit from physical therapy.    Temporal arteritis (HCC)-She had no temporal artery tenderness at the last visit.  Not biopsy-proven.  She is currently on prednisone 9 mg daily.            [name]     On prednisone therapy - She is taking prednisone 9 mg daily.  She understands the side effects of prednisone including hypertension, diabetes, obesity, osteoporosis and increased risk of infection.   Pain in left hip - She has severe end-stage osteoarthritis of the left hip.  She continues to have discomfort in her hip joint.   Primary osteoarthritis of both knees-she has chronic discomfort in her knee joints.  No synovitis was noted at the last visit.   Spinal stenosis, thoracic-she is in chronic pain.  Chronic pain   DDD (degenerative disc disease), lumbar-she continues to have chronic  pain   Chronic pain syndrome-  She takes Tylenol and Nucynta.  Iliopsoas abscess [facility]): Requiring recent hospitalization.  Currently at [facility] rehab center.   Other medical conditions are listed as follows:    History of chronic kidney disease - Stage 3b CKD.  Followed by Dr. Theador Hawthorne.    History of hypertension   Colonic ulcer - Evident on colonoscopy 09/22/2021. Followed by Dr. Laural Golden. Nonbleeding ulcer noted at splenic flexure.    History of diverticulitis   History of gastroesophageal reflux (GERD)   Vitamin D deficiency   Memory loss   Follow Up Instructions: She will follow up in    I  discussed the assessment and treatment plan with the patient. The patient was provided an opportunity to ask questions and all were answered. The patient agreed with the plan and demonstrated an understanding of the instructions.   The patient was advised to call back or seek an in-person evaluation if the symptoms worsen or if the condition fails to improve as anticipated.  I provided 25 minutes of non-face-to-face time during this encounter.  Scribed by- Hazel Sams, PA-C  I reviewed the above note and I attest to the accuracy of the documentation   Bo Merino, MD

## 2021-11-14 DIAGNOSIS — F411 Generalized anxiety disorder: Secondary | ICD-10-CM | POA: Diagnosis not present

## 2021-11-14 DIAGNOSIS — K219 Gastro-esophageal reflux disease without esophagitis: Secondary | ICD-10-CM | POA: Diagnosis not present

## 2021-11-14 DIAGNOSIS — K6812 Psoas muscle abscess: Secondary | ICD-10-CM | POA: Diagnosis not present

## 2021-11-14 DIAGNOSIS — I5022 Chronic systolic (congestive) heart failure: Secondary | ICD-10-CM | POA: Diagnosis not present

## 2021-11-14 DIAGNOSIS — I10 Essential (primary) hypertension: Secondary | ICD-10-CM | POA: Diagnosis not present

## 2021-11-14 DIAGNOSIS — R2689 Other abnormalities of gait and mobility: Secondary | ICD-10-CM | POA: Diagnosis not present

## 2021-11-14 DIAGNOSIS — M6281 Muscle weakness (generalized): Secondary | ICD-10-CM | POA: Diagnosis not present

## 2021-11-15 ENCOUNTER — Encounter: Payer: Self-pay | Admitting: Rheumatology

## 2021-11-15 ENCOUNTER — Other Ambulatory Visit: Payer: Self-pay

## 2021-11-15 ENCOUNTER — Ambulatory Visit (INDEPENDENT_AMBULATORY_CARE_PROVIDER_SITE_OTHER): Payer: Medicare Other | Admitting: Rheumatology

## 2021-11-15 VITALS — Ht 60.0 in | Wt 208.0 lb

## 2021-11-15 DIAGNOSIS — M4804 Spinal stenosis, thoracic region: Secondary | ICD-10-CM

## 2021-11-15 DIAGNOSIS — M170 Bilateral primary osteoarthritis of knee: Secondary | ICD-10-CM | POA: Diagnosis not present

## 2021-11-15 DIAGNOSIS — Z87448 Personal history of other diseases of urinary system: Secondary | ICD-10-CM

## 2021-11-15 DIAGNOSIS — M5136 Other intervertebral disc degeneration, lumbar region: Secondary | ICD-10-CM | POA: Diagnosis not present

## 2021-11-15 DIAGNOSIS — M0579 Rheumatoid arthritis with rheumatoid factor of multiple sites without organ or systems involvement: Secondary | ICD-10-CM

## 2021-11-15 DIAGNOSIS — Z7952 Long term (current) use of systemic steroids: Secondary | ICD-10-CM | POA: Diagnosis not present

## 2021-11-15 DIAGNOSIS — Z8679 Personal history of other diseases of the circulatory system: Secondary | ICD-10-CM

## 2021-11-15 DIAGNOSIS — K6812 Psoas muscle abscess: Secondary | ICD-10-CM | POA: Diagnosis not present

## 2021-11-15 DIAGNOSIS — M6281 Muscle weakness (generalized): Secondary | ICD-10-CM | POA: Diagnosis not present

## 2021-11-15 DIAGNOSIS — R413 Other amnesia: Secondary | ICD-10-CM

## 2021-11-15 DIAGNOSIS — M316 Other giant cell arteritis: Secondary | ICD-10-CM

## 2021-11-15 DIAGNOSIS — Z79899 Other long term (current) drug therapy: Secondary | ICD-10-CM | POA: Diagnosis not present

## 2021-11-15 DIAGNOSIS — M25552 Pain in left hip: Secondary | ICD-10-CM | POA: Diagnosis not present

## 2021-11-15 DIAGNOSIS — R2689 Other abnormalities of gait and mobility: Secondary | ICD-10-CM | POA: Diagnosis not present

## 2021-11-15 DIAGNOSIS — G894 Chronic pain syndrome: Secondary | ICD-10-CM | POA: Diagnosis not present

## 2021-11-15 DIAGNOSIS — M353 Polymyalgia rheumatica: Secondary | ICD-10-CM | POA: Diagnosis not present

## 2021-11-15 DIAGNOSIS — E559 Vitamin D deficiency, unspecified: Secondary | ICD-10-CM

## 2021-11-15 DIAGNOSIS — Z8719 Personal history of other diseases of the digestive system: Secondary | ICD-10-CM

## 2021-11-15 DIAGNOSIS — K633 Ulcer of intestine: Secondary | ICD-10-CM

## 2021-11-16 DIAGNOSIS — K6812 Psoas muscle abscess: Secondary | ICD-10-CM | POA: Diagnosis not present

## 2021-11-16 DIAGNOSIS — R2689 Other abnormalities of gait and mobility: Secondary | ICD-10-CM | POA: Diagnosis not present

## 2021-11-16 DIAGNOSIS — M6281 Muscle weakness (generalized): Secondary | ICD-10-CM | POA: Diagnosis not present

## 2021-11-16 IMAGING — CT CT HEAD CODE STROKE
3 series · 15 of 46 positions shown, 18 images · non-contrast
Comparison: None.

CLINICAL DATA: Code stroke.  Speech changes

EXAM:
CT HEAD WITHOUT CONTRAST
TECHNIQUE: Contiguous axial images were obtained from the base of the skull
through the vertex without intravenous contrast.

[Series 2: head w o · axial · 0.47mm/px · z∈[+82,+202]mm · 9 of 29 slices shown, 12 images]
[im 3/29  brain]
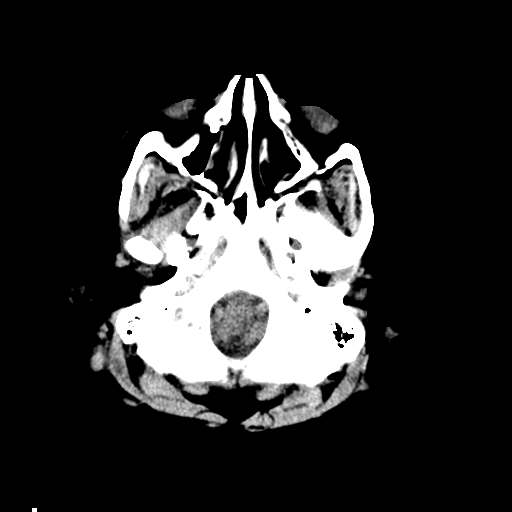
[im 3/29  bone]
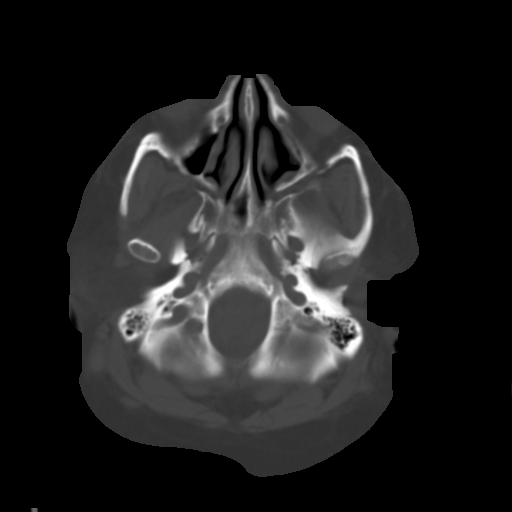
[im 6/29  brain]
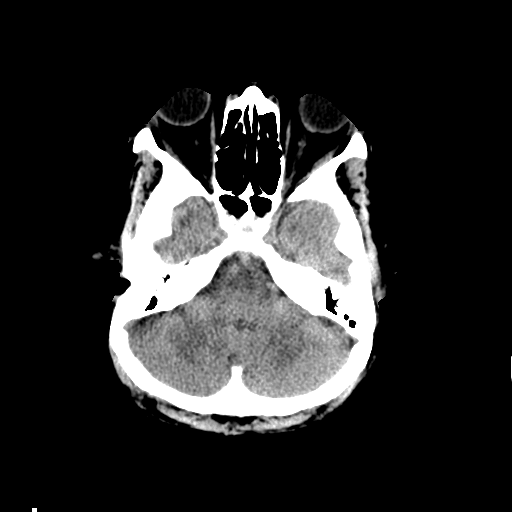
[im 9/29  brain]
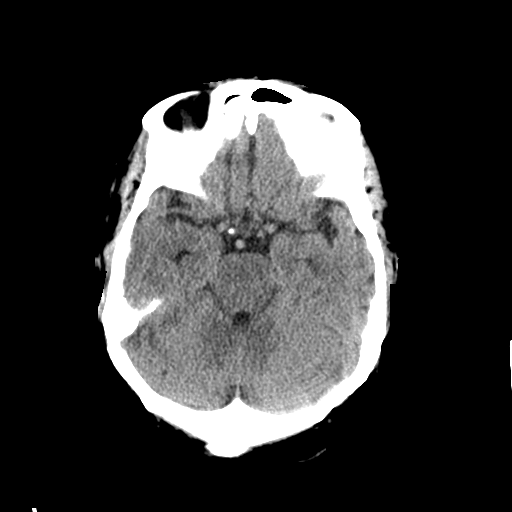
[im 12/29  brain]
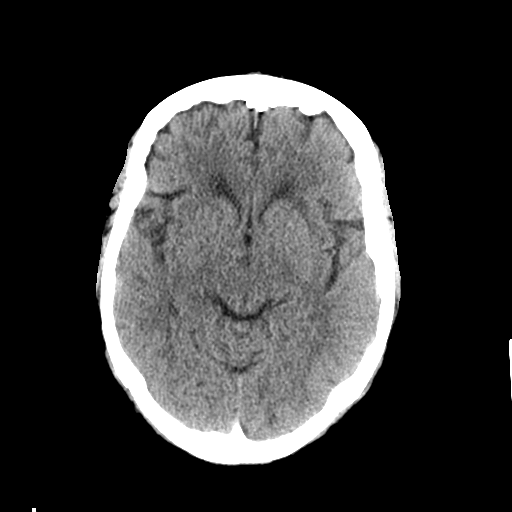
[im 15/29  brain]
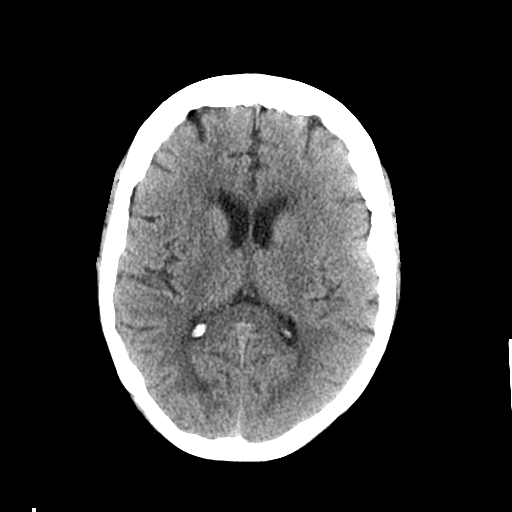
[im 15/29  bone]
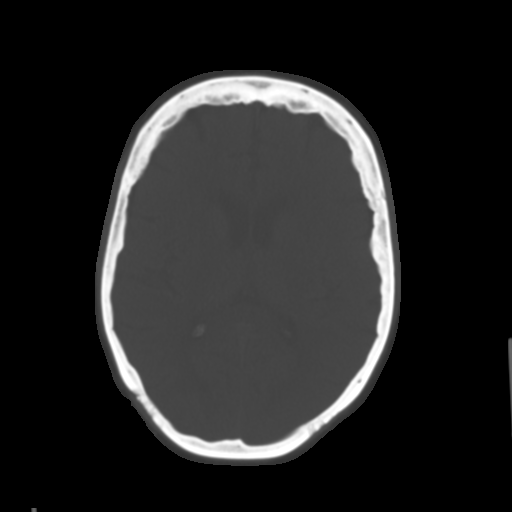
[im 18/29  brain]
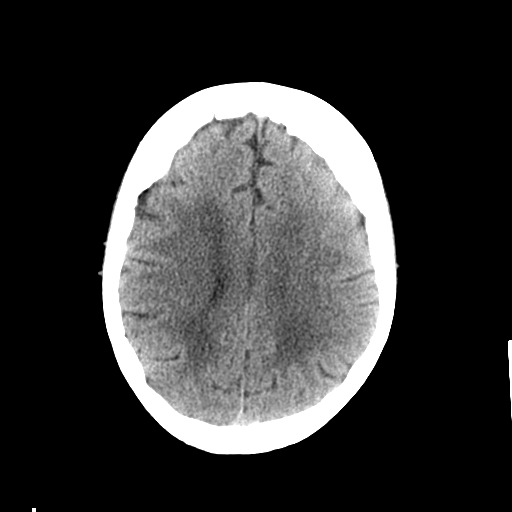
[im 21/29  brain]
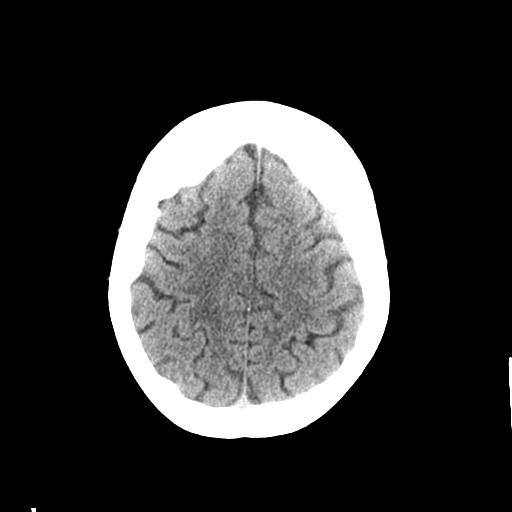
[im 24/29  brain]
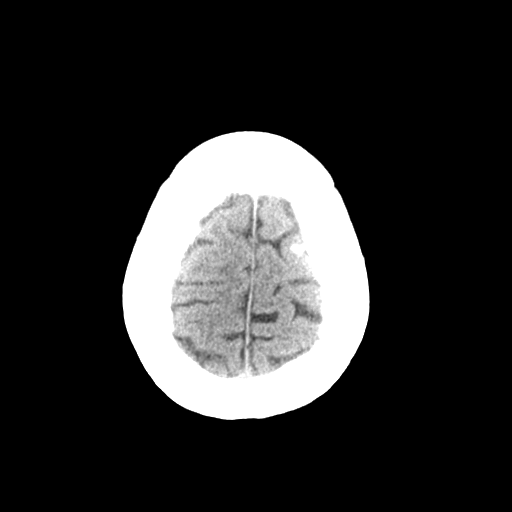
[im 27/29  brain]
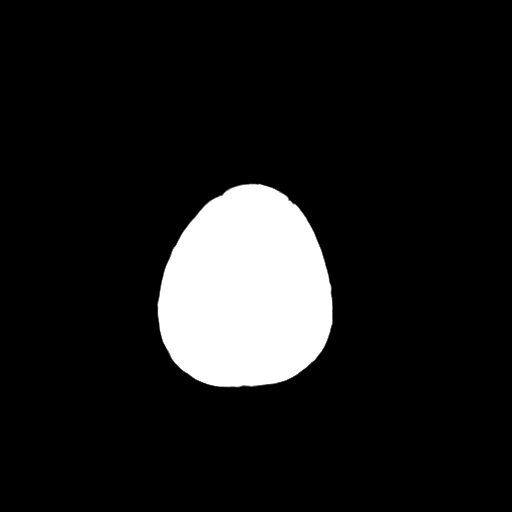
[im 27/29  bone]
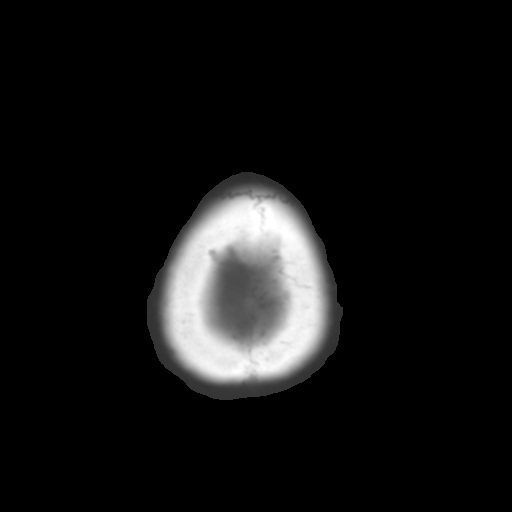

[Series 4: coronal soft · coronal · 0.31mm/px · 3 of 67 slices shown]
[im 23/67  brain]
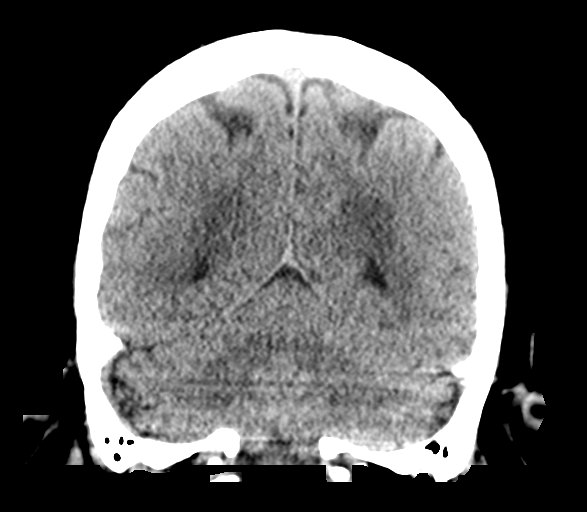
[im 30/67  brain]
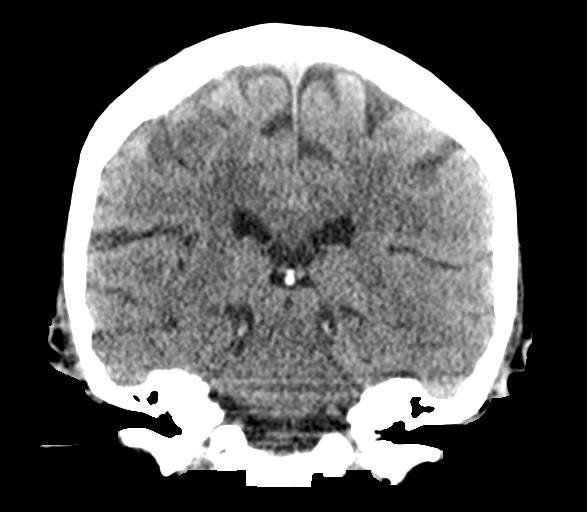
[im 37/67  brain]
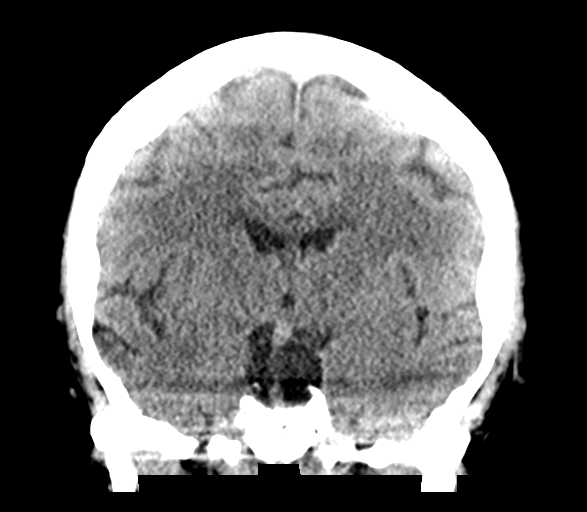

[Series 5: sagittal soft · sagittal · 0.30mm/px · 3 of 54 slices shown]
[im 18/54  brain]
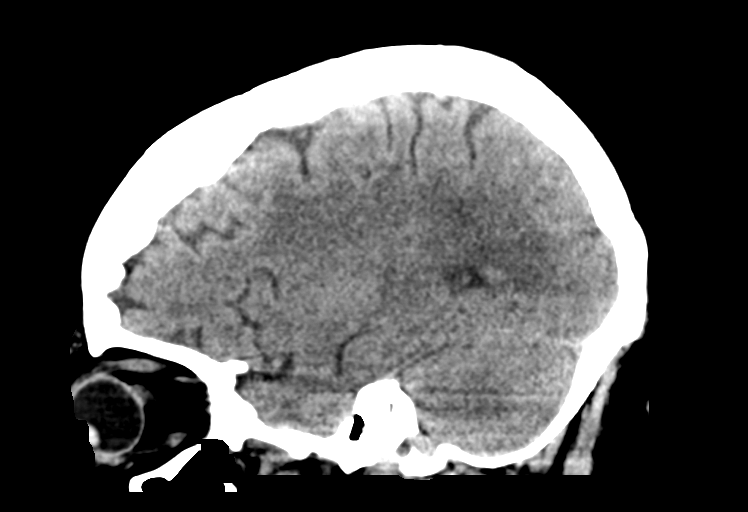
[im 27/54  brain]
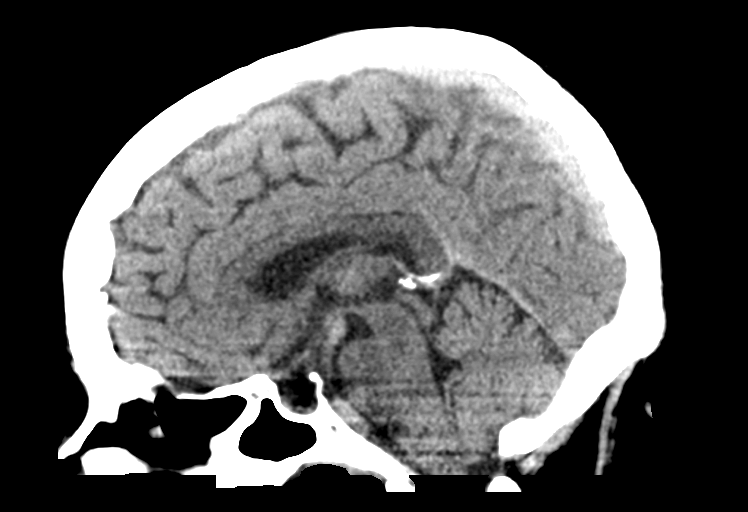
[im 36/54  brain]
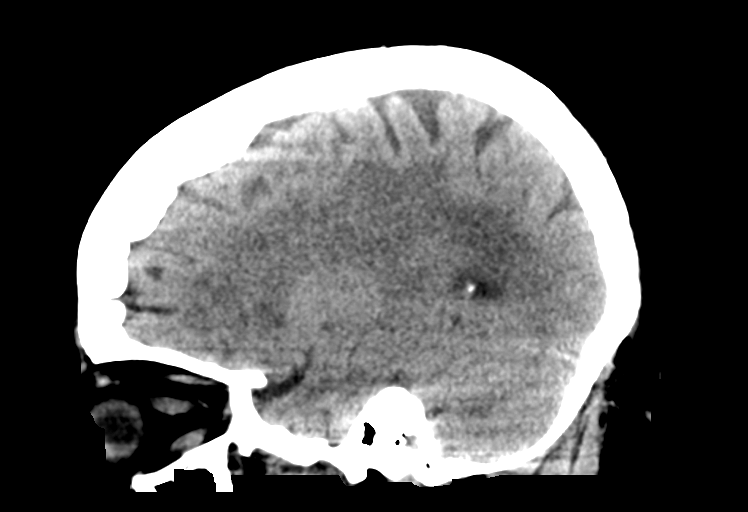

[15 of 46 positions shown; findings below may reference images not displayed]

FINDINGS: Brain: There is no mass, hemorrhage or extra-axial collection. The
size and configuration of the ventricles and extra-axial CSF spaces
are normal. The brain parenchyma is normal, without evidence of
acute or chronic infarction.

Vascular: No abnormal hyperdensity of the major intracranial
arteries or dural venous sinuses. No intracranial atherosclerosis.

Skull: The visualized skull base, calvarium and extracranial soft
tissues are normal.

Sinuses/Orbits: No fluid levels or advanced mucosal thickening of
the visualized paranasal sinuses. No mastoid or middle ear effusion.
The orbits are normal.

ASPECTS (Alberta Stroke Program Early CT Score)

- Ganglionic level infarction (caudate, lentiform nuclei, internal
capsule, insula, M1-M3 cortex): 7

- Supraganglionic infarction (M4-M6 cortex): 3

Total score (0-10 with 10 being normal): 10
IMPRESSION: 1. Normal head CT.
2. ASPECTS is 10.

These results were called by telephone at the time of interpretation
on 07/20/2021 at [DATE] to provider SEV IKEDA , who verbally
acknowledged these results.

## 2021-11-17 DIAGNOSIS — R2689 Other abnormalities of gait and mobility: Secondary | ICD-10-CM | POA: Diagnosis not present

## 2021-11-17 DIAGNOSIS — K6812 Psoas muscle abscess: Secondary | ICD-10-CM | POA: Diagnosis not present

## 2021-11-17 DIAGNOSIS — M6281 Muscle weakness (generalized): Secondary | ICD-10-CM | POA: Diagnosis not present

## 2021-11-18 DIAGNOSIS — M6281 Muscle weakness (generalized): Secondary | ICD-10-CM | POA: Diagnosis not present

## 2021-11-18 DIAGNOSIS — R2689 Other abnormalities of gait and mobility: Secondary | ICD-10-CM | POA: Diagnosis not present

## 2021-11-18 DIAGNOSIS — K6812 Psoas muscle abscess: Secondary | ICD-10-CM | POA: Diagnosis not present

## 2021-11-21 DIAGNOSIS — R52 Pain, unspecified: Secondary | ICD-10-CM | POA: Diagnosis not present

## 2021-11-21 DIAGNOSIS — M1712 Unilateral primary osteoarthritis, left knee: Secondary | ICD-10-CM | POA: Diagnosis not present

## 2021-11-21 DIAGNOSIS — S300XXA Contusion of lower back and pelvis, initial encounter: Secondary | ICD-10-CM | POA: Diagnosis not present

## 2021-11-21 DIAGNOSIS — M6281 Muscle weakness (generalized): Secondary | ICD-10-CM | POA: Diagnosis not present

## 2021-11-21 DIAGNOSIS — I509 Heart failure, unspecified: Secondary | ICD-10-CM | POA: Diagnosis not present

## 2021-11-21 DIAGNOSIS — M545 Low back pain, unspecified: Secondary | ICD-10-CM | POA: Diagnosis not present

## 2021-11-21 DIAGNOSIS — W19XXXA Unspecified fall, initial encounter: Secondary | ICD-10-CM | POA: Diagnosis not present

## 2021-11-21 DIAGNOSIS — K6812 Psoas muscle abscess: Secondary | ICD-10-CM | POA: Diagnosis not present

## 2021-11-21 DIAGNOSIS — Z882 Allergy status to sulfonamides status: Secondary | ICD-10-CM | POA: Diagnosis not present

## 2021-11-21 DIAGNOSIS — M79606 Pain in leg, unspecified: Secondary | ICD-10-CM | POA: Diagnosis not present

## 2021-11-21 DIAGNOSIS — I110 Hypertensive heart disease with heart failure: Secondary | ICD-10-CM | POA: Diagnosis not present

## 2021-11-21 DIAGNOSIS — M79605 Pain in left leg: Secondary | ICD-10-CM | POA: Diagnosis not present

## 2021-11-21 DIAGNOSIS — R102 Pelvic and perineal pain: Secondary | ICD-10-CM | POA: Diagnosis not present

## 2021-11-21 DIAGNOSIS — S20229A Contusion of unspecified back wall of thorax, initial encounter: Secondary | ICD-10-CM | POA: Diagnosis not present

## 2021-11-21 DIAGNOSIS — Z885 Allergy status to narcotic agent status: Secondary | ICD-10-CM | POA: Diagnosis not present

## 2021-11-21 DIAGNOSIS — M47816 Spondylosis without myelopathy or radiculopathy, lumbar region: Secondary | ICD-10-CM | POA: Diagnosis not present

## 2021-11-21 DIAGNOSIS — R2689 Other abnormalities of gait and mobility: Secondary | ICD-10-CM | POA: Diagnosis not present

## 2021-11-21 DIAGNOSIS — M5489 Other dorsalgia: Secondary | ICD-10-CM | POA: Diagnosis not present

## 2021-11-21 DIAGNOSIS — Z880 Allergy status to penicillin: Secondary | ICD-10-CM | POA: Diagnosis not present

## 2021-11-21 DIAGNOSIS — E785 Hyperlipidemia, unspecified: Secondary | ICD-10-CM | POA: Diagnosis not present

## 2021-11-21 DIAGNOSIS — I700 Atherosclerosis of aorta: Secondary | ICD-10-CM | POA: Diagnosis not present

## 2021-11-21 DIAGNOSIS — M160 Bilateral primary osteoarthritis of hip: Secondary | ICD-10-CM | POA: Diagnosis not present

## 2021-11-21 DIAGNOSIS — M1611 Unilateral primary osteoarthritis, right hip: Secondary | ICD-10-CM | POA: Diagnosis not present

## 2021-11-21 DIAGNOSIS — W010XXA Fall on same level from slipping, tripping and stumbling without subsequent striking against object, initial encounter: Secondary | ICD-10-CM | POA: Diagnosis not present

## 2021-11-23 DIAGNOSIS — R1310 Dysphagia, unspecified: Secondary | ICD-10-CM | POA: Diagnosis not present

## 2021-11-23 DIAGNOSIS — K6812 Psoas muscle abscess: Secondary | ICD-10-CM | POA: Diagnosis not present

## 2021-11-23 DIAGNOSIS — R2689 Other abnormalities of gait and mobility: Secondary | ICD-10-CM | POA: Diagnosis not present

## 2021-11-23 DIAGNOSIS — M6281 Muscle weakness (generalized): Secondary | ICD-10-CM | POA: Diagnosis not present

## 2021-11-24 DIAGNOSIS — R2689 Other abnormalities of gait and mobility: Secondary | ICD-10-CM | POA: Diagnosis not present

## 2021-11-24 DIAGNOSIS — M6281 Muscle weakness (generalized): Secondary | ICD-10-CM | POA: Diagnosis not present

## 2021-11-24 DIAGNOSIS — K6812 Psoas muscle abscess: Secondary | ICD-10-CM | POA: Diagnosis not present

## 2021-11-24 DIAGNOSIS — R1310 Dysphagia, unspecified: Secondary | ICD-10-CM | POA: Diagnosis not present

## 2021-11-25 DIAGNOSIS — K6812 Psoas muscle abscess: Secondary | ICD-10-CM | POA: Diagnosis not present

## 2021-11-25 DIAGNOSIS — M6281 Muscle weakness (generalized): Secondary | ICD-10-CM | POA: Diagnosis not present

## 2021-11-25 DIAGNOSIS — R1310 Dysphagia, unspecified: Secondary | ICD-10-CM | POA: Diagnosis not present

## 2021-11-25 DIAGNOSIS — R2689 Other abnormalities of gait and mobility: Secondary | ICD-10-CM | POA: Diagnosis not present

## 2021-11-28 DIAGNOSIS — K6812 Psoas muscle abscess: Secondary | ICD-10-CM | POA: Diagnosis not present

## 2021-11-28 DIAGNOSIS — M6281 Muscle weakness (generalized): Secondary | ICD-10-CM | POA: Diagnosis not present

## 2021-11-28 DIAGNOSIS — R1310 Dysphagia, unspecified: Secondary | ICD-10-CM | POA: Diagnosis not present

## 2021-11-28 DIAGNOSIS — R2689 Other abnormalities of gait and mobility: Secondary | ICD-10-CM | POA: Diagnosis not present

## 2021-11-29 DIAGNOSIS — K6812 Psoas muscle abscess: Secondary | ICD-10-CM | POA: Diagnosis not present

## 2021-11-29 DIAGNOSIS — R1310 Dysphagia, unspecified: Secondary | ICD-10-CM | POA: Diagnosis not present

## 2021-11-29 DIAGNOSIS — M6281 Muscle weakness (generalized): Secondary | ICD-10-CM | POA: Diagnosis not present

## 2021-11-29 DIAGNOSIS — R2689 Other abnormalities of gait and mobility: Secondary | ICD-10-CM | POA: Diagnosis not present

## 2021-11-30 DIAGNOSIS — M6281 Muscle weakness (generalized): Secondary | ICD-10-CM | POA: Diagnosis not present

## 2021-11-30 DIAGNOSIS — K6812 Psoas muscle abscess: Secondary | ICD-10-CM | POA: Diagnosis not present

## 2021-11-30 DIAGNOSIS — R2689 Other abnormalities of gait and mobility: Secondary | ICD-10-CM | POA: Diagnosis not present

## 2021-11-30 DIAGNOSIS — R1310 Dysphagia, unspecified: Secondary | ICD-10-CM | POA: Diagnosis not present

## 2021-12-01 DIAGNOSIS — F5101 Primary insomnia: Secondary | ICD-10-CM | POA: Diagnosis not present

## 2021-12-01 DIAGNOSIS — R2689 Other abnormalities of gait and mobility: Secondary | ICD-10-CM | POA: Diagnosis not present

## 2021-12-01 DIAGNOSIS — M6281 Muscle weakness (generalized): Secondary | ICD-10-CM | POA: Diagnosis not present

## 2021-12-01 DIAGNOSIS — K6812 Psoas muscle abscess: Secondary | ICD-10-CM | POA: Diagnosis not present

## 2021-12-01 DIAGNOSIS — F32A Depression, unspecified: Secondary | ICD-10-CM | POA: Diagnosis not present

## 2021-12-01 DIAGNOSIS — F413 Other mixed anxiety disorders: Secondary | ICD-10-CM | POA: Diagnosis not present

## 2021-12-01 DIAGNOSIS — R1310 Dysphagia, unspecified: Secondary | ICD-10-CM | POA: Diagnosis not present

## 2021-12-02 DIAGNOSIS — K6812 Psoas muscle abscess: Secondary | ICD-10-CM | POA: Diagnosis not present

## 2021-12-02 DIAGNOSIS — R2689 Other abnormalities of gait and mobility: Secondary | ICD-10-CM | POA: Diagnosis not present

## 2021-12-02 DIAGNOSIS — M6281 Muscle weakness (generalized): Secondary | ICD-10-CM | POA: Diagnosis not present

## 2021-12-02 DIAGNOSIS — R1310 Dysphagia, unspecified: Secondary | ICD-10-CM | POA: Diagnosis not present

## 2021-12-05 DIAGNOSIS — Z20822 Contact with and (suspected) exposure to covid-19: Secondary | ICD-10-CM | POA: Diagnosis not present

## 2021-12-05 DIAGNOSIS — R2689 Other abnormalities of gait and mobility: Secondary | ICD-10-CM | POA: Diagnosis not present

## 2021-12-05 DIAGNOSIS — M6281 Muscle weakness (generalized): Secondary | ICD-10-CM | POA: Diagnosis not present

## 2021-12-05 DIAGNOSIS — K6812 Psoas muscle abscess: Secondary | ICD-10-CM | POA: Diagnosis not present

## 2021-12-05 DIAGNOSIS — R1310 Dysphagia, unspecified: Secondary | ICD-10-CM | POA: Diagnosis not present

## 2021-12-06 DIAGNOSIS — K6812 Psoas muscle abscess: Secondary | ICD-10-CM | POA: Diagnosis not present

## 2021-12-06 DIAGNOSIS — R2689 Other abnormalities of gait and mobility: Secondary | ICD-10-CM | POA: Diagnosis not present

## 2021-12-06 DIAGNOSIS — M6281 Muscle weakness (generalized): Secondary | ICD-10-CM | POA: Diagnosis not present

## 2021-12-06 DIAGNOSIS — R1310 Dysphagia, unspecified: Secondary | ICD-10-CM | POA: Diagnosis not present

## 2021-12-07 DIAGNOSIS — R2689 Other abnormalities of gait and mobility: Secondary | ICD-10-CM | POA: Diagnosis not present

## 2021-12-07 DIAGNOSIS — R1310 Dysphagia, unspecified: Secondary | ICD-10-CM | POA: Diagnosis not present

## 2021-12-07 DIAGNOSIS — M6281 Muscle weakness (generalized): Secondary | ICD-10-CM | POA: Diagnosis not present

## 2021-12-07 DIAGNOSIS — K6812 Psoas muscle abscess: Secondary | ICD-10-CM | POA: Diagnosis not present

## 2021-12-08 DIAGNOSIS — K6812 Psoas muscle abscess: Secondary | ICD-10-CM | POA: Diagnosis not present

## 2021-12-08 DIAGNOSIS — R1310 Dysphagia, unspecified: Secondary | ICD-10-CM | POA: Diagnosis not present

## 2021-12-08 DIAGNOSIS — M6281 Muscle weakness (generalized): Secondary | ICD-10-CM | POA: Diagnosis not present

## 2021-12-08 DIAGNOSIS — R2689 Other abnormalities of gait and mobility: Secondary | ICD-10-CM | POA: Diagnosis not present

## 2021-12-09 DIAGNOSIS — K6812 Psoas muscle abscess: Secondary | ICD-10-CM | POA: Diagnosis not present

## 2021-12-09 DIAGNOSIS — R1310 Dysphagia, unspecified: Secondary | ICD-10-CM | POA: Diagnosis not present

## 2021-12-09 DIAGNOSIS — M6281 Muscle weakness (generalized): Secondary | ICD-10-CM | POA: Diagnosis not present

## 2021-12-09 DIAGNOSIS — R2689 Other abnormalities of gait and mobility: Secondary | ICD-10-CM | POA: Diagnosis not present

## 2021-12-12 DIAGNOSIS — K6812 Psoas muscle abscess: Secondary | ICD-10-CM | POA: Diagnosis not present

## 2021-12-12 DIAGNOSIS — M6281 Muscle weakness (generalized): Secondary | ICD-10-CM | POA: Diagnosis not present

## 2021-12-12 DIAGNOSIS — R2689 Other abnormalities of gait and mobility: Secondary | ICD-10-CM | POA: Diagnosis not present

## 2021-12-12 DIAGNOSIS — R1310 Dysphagia, unspecified: Secondary | ICD-10-CM | POA: Diagnosis not present

## 2021-12-13 DIAGNOSIS — I5022 Chronic systolic (congestive) heart failure: Secondary | ICD-10-CM | POA: Diagnosis not present

## 2021-12-13 DIAGNOSIS — K219 Gastro-esophageal reflux disease without esophagitis: Secondary | ICD-10-CM | POA: Diagnosis not present

## 2021-12-13 DIAGNOSIS — K6812 Psoas muscle abscess: Secondary | ICD-10-CM | POA: Diagnosis not present

## 2021-12-13 DIAGNOSIS — R2689 Other abnormalities of gait and mobility: Secondary | ICD-10-CM | POA: Diagnosis not present

## 2021-12-13 DIAGNOSIS — I10 Essential (primary) hypertension: Secondary | ICD-10-CM | POA: Diagnosis not present

## 2021-12-13 DIAGNOSIS — R1310 Dysphagia, unspecified: Secondary | ICD-10-CM | POA: Diagnosis not present

## 2021-12-13 DIAGNOSIS — M6281 Muscle weakness (generalized): Secondary | ICD-10-CM | POA: Diagnosis not present

## 2021-12-13 DIAGNOSIS — F411 Generalized anxiety disorder: Secondary | ICD-10-CM | POA: Diagnosis not present

## 2021-12-14 DIAGNOSIS — K6812 Psoas muscle abscess: Secondary | ICD-10-CM | POA: Diagnosis not present

## 2021-12-14 DIAGNOSIS — R1310 Dysphagia, unspecified: Secondary | ICD-10-CM | POA: Diagnosis not present

## 2021-12-14 DIAGNOSIS — R2689 Other abnormalities of gait and mobility: Secondary | ICD-10-CM | POA: Diagnosis not present

## 2021-12-14 DIAGNOSIS — M6281 Muscle weakness (generalized): Secondary | ICD-10-CM | POA: Diagnosis not present

## 2021-12-15 DIAGNOSIS — F32A Depression, unspecified: Secondary | ICD-10-CM | POA: Diagnosis not present

## 2021-12-15 DIAGNOSIS — F413 Other mixed anxiety disorders: Secondary | ICD-10-CM | POA: Diagnosis not present

## 2021-12-15 DIAGNOSIS — F5101 Primary insomnia: Secondary | ICD-10-CM | POA: Diagnosis not present

## 2021-12-16 ENCOUNTER — Encounter (HOSPITAL_COMMUNITY): Payer: Self-pay

## 2021-12-16 ENCOUNTER — Other Ambulatory Visit: Payer: Self-pay

## 2021-12-16 ENCOUNTER — Emergency Department (HOSPITAL_COMMUNITY): Payer: Medicare Other

## 2021-12-16 ENCOUNTER — Inpatient Hospital Stay (HOSPITAL_COMMUNITY)
Admission: EM | Admit: 2021-12-16 | Discharge: 2021-12-23 | DRG: 682 | Disposition: A | Discharge status: Rehabilitation Facility | Payer: Medicare Other | Source: Other Acute Inpatient Hospital | Attending: Internal Medicine | Admitting: Internal Medicine

## 2021-12-16 DIAGNOSIS — E860 Dehydration: Secondary | ICD-10-CM | POA: Diagnosis not present

## 2021-12-16 DIAGNOSIS — R109 Unspecified abdominal pain: Secondary | ICD-10-CM | POA: Diagnosis not present

## 2021-12-16 DIAGNOSIS — Z880 Allergy status to penicillin: Secondary | ICD-10-CM | POA: Diagnosis not present

## 2021-12-16 DIAGNOSIS — I10 Essential (primary) hypertension: Secondary | ICD-10-CM

## 2021-12-16 DIAGNOSIS — R1032 Left lower quadrant pain: Secondary | ICD-10-CM | POA: Diagnosis not present

## 2021-12-16 DIAGNOSIS — L97909 Non-pressure chronic ulcer of unspecified part of unspecified lower leg with unspecified severity: Secondary | ICD-10-CM | POA: Diagnosis not present

## 2021-12-16 DIAGNOSIS — I5032 Chronic diastolic (congestive) heart failure: Secondary | ICD-10-CM | POA: Diagnosis present

## 2021-12-16 DIAGNOSIS — N3000 Acute cystitis without hematuria: Secondary | ICD-10-CM

## 2021-12-16 DIAGNOSIS — L89322 Pressure ulcer of left buttock, stage 2: Secondary | ICD-10-CM | POA: Diagnosis present

## 2021-12-16 DIAGNOSIS — R4182 Altered mental status, unspecified: Secondary | ICD-10-CM | POA: Diagnosis not present

## 2021-12-16 DIAGNOSIS — I110 Hypertensive heart disease with heart failure: Secondary | ICD-10-CM | POA: Diagnosis present

## 2021-12-16 DIAGNOSIS — G894 Chronic pain syndrome: Secondary | ICD-10-CM | POA: Diagnosis not present

## 2021-12-16 DIAGNOSIS — K219 Gastro-esophageal reflux disease without esophagitis: Secondary | ICD-10-CM | POA: Diagnosis present

## 2021-12-16 DIAGNOSIS — N179 Acute kidney failure, unspecified: Secondary | ICD-10-CM | POA: Diagnosis present

## 2021-12-16 DIAGNOSIS — S81802D Unspecified open wound, left lower leg, subsequent encounter: Secondary | ICD-10-CM | POA: Diagnosis not present

## 2021-12-16 DIAGNOSIS — R410 Disorientation, unspecified: Secondary | ICD-10-CM | POA: Diagnosis not present

## 2021-12-16 DIAGNOSIS — N390 Urinary tract infection, site not specified: Secondary | ICD-10-CM | POA: Diagnosis present

## 2021-12-16 DIAGNOSIS — R0902 Hypoxemia: Secondary | ICD-10-CM | POA: Diagnosis not present

## 2021-12-16 DIAGNOSIS — Z1612 Extended spectrum beta lactamase (ESBL) resistance: Secondary | ICD-10-CM | POA: Diagnosis present

## 2021-12-16 DIAGNOSIS — L89312 Pressure ulcer of right buttock, stage 2: Secondary | ICD-10-CM | POA: Diagnosis present

## 2021-12-16 DIAGNOSIS — B3749 Other urogenital candidiasis: Secondary | ICD-10-CM | POA: Diagnosis present

## 2021-12-16 DIAGNOSIS — R1310 Dysphagia, unspecified: Secondary | ICD-10-CM

## 2021-12-16 DIAGNOSIS — M353 Polymyalgia rheumatica: Secondary | ICD-10-CM | POA: Diagnosis present

## 2021-12-16 DIAGNOSIS — Z882 Allergy status to sulfonamides status: Secondary | ICD-10-CM | POA: Diagnosis not present

## 2021-12-16 DIAGNOSIS — E870 Hyperosmolality and hypernatremia: Secondary | ICD-10-CM | POA: Diagnosis not present

## 2021-12-16 DIAGNOSIS — E876 Hypokalemia: Secondary | ICD-10-CM | POA: Diagnosis present

## 2021-12-16 DIAGNOSIS — L89302 Pressure ulcer of unspecified buttock, stage 2: Secondary | ICD-10-CM | POA: Diagnosis not present

## 2021-12-16 DIAGNOSIS — Z4659 Encounter for fitting and adjustment of other gastrointestinal appliance and device: Secondary | ICD-10-CM | POA: Diagnosis not present

## 2021-12-16 DIAGNOSIS — R338 Other retention of urine: Secondary | ICD-10-CM

## 2021-12-16 DIAGNOSIS — G9341 Metabolic encephalopathy: Secondary | ICD-10-CM | POA: Diagnosis present

## 2021-12-16 DIAGNOSIS — Z452 Encounter for adjustment and management of vascular access device: Secondary | ICD-10-CM | POA: Diagnosis not present

## 2021-12-16 DIAGNOSIS — R000 Tachycardia, unspecified: Secondary | ICD-10-CM | POA: Diagnosis not present

## 2021-12-16 DIAGNOSIS — Z20822 Contact with and (suspected) exposure to covid-19: Secondary | ICD-10-CM | POA: Diagnosis present

## 2021-12-16 DIAGNOSIS — L8990 Pressure ulcer of unspecified site, unspecified stage: Secondary | ICD-10-CM | POA: Insufficient documentation

## 2021-12-16 DIAGNOSIS — B9620 Unspecified Escherichia coli [E. coli] as the cause of diseases classified elsewhere: Secondary | ICD-10-CM | POA: Diagnosis not present

## 2021-12-16 DIAGNOSIS — R1312 Dysphagia, oropharyngeal phase: Secondary | ICD-10-CM | POA: Diagnosis not present

## 2021-12-16 DIAGNOSIS — S81802A Unspecified open wound, left lower leg, initial encounter: Secondary | ICD-10-CM | POA: Diagnosis present

## 2021-12-16 DIAGNOSIS — R404 Transient alteration of awareness: Secondary | ICD-10-CM | POA: Diagnosis not present

## 2021-12-16 DIAGNOSIS — L89899 Pressure ulcer of other site, unspecified stage: Secondary | ICD-10-CM | POA: Diagnosis present

## 2021-12-16 DIAGNOSIS — J9811 Atelectasis: Secondary | ICD-10-CM | POA: Diagnosis not present

## 2021-12-16 DIAGNOSIS — I739 Peripheral vascular disease, unspecified: Secondary | ICD-10-CM | POA: Diagnosis not present

## 2021-12-16 DIAGNOSIS — R9431 Abnormal electrocardiogram [ECG] [EKG]: Secondary | ICD-10-CM | POA: Diagnosis not present

## 2021-12-16 DIAGNOSIS — Z6838 Body mass index (BMI) 38.0-38.9, adult: Secondary | ICD-10-CM | POA: Diagnosis not present

## 2021-12-16 DIAGNOSIS — S81802S Unspecified open wound, left lower leg, sequela: Secondary | ICD-10-CM

## 2021-12-16 DIAGNOSIS — Z8261 Family history of arthritis: Secondary | ICD-10-CM

## 2021-12-16 DIAGNOSIS — Z4682 Encounter for fitting and adjustment of non-vascular catheter: Secondary | ICD-10-CM | POA: Diagnosis not present

## 2021-12-16 DIAGNOSIS — Z7952 Long term (current) use of systemic steroids: Secondary | ICD-10-CM

## 2021-12-16 DIAGNOSIS — Z6841 Body Mass Index (BMI) 40.0 and over, adult: Secondary | ICD-10-CM

## 2021-12-16 DIAGNOSIS — Z833 Family history of diabetes mellitus: Secondary | ICD-10-CM

## 2021-12-16 DIAGNOSIS — R457 State of emotional shock and stress, unspecified: Secondary | ICD-10-CM | POA: Diagnosis not present

## 2021-12-16 DIAGNOSIS — I959 Hypotension, unspecified: Secondary | ICD-10-CM | POA: Diagnosis not present

## 2021-12-16 DIAGNOSIS — Z8249 Family history of ischemic heart disease and other diseases of the circulatory system: Secondary | ICD-10-CM

## 2021-12-16 LAB — CBC WITH DIFFERENTIAL/PLATELET
Abs Immature Granulocytes: 0.07 10*3/uL (ref 0.00–0.07)
Basophils Absolute: 0 10*3/uL (ref 0.0–0.1)
Basophils Relative: 0 %
Eosinophils Absolute: 0 10*3/uL (ref 0.0–0.5)
Eosinophils Relative: 0 %
HCT: 39.7 % (ref 36.0–46.0)
Hemoglobin: 13.7 g/dL (ref 12.0–15.0)
Immature Granulocytes: 1 %
Lymphocytes Relative: 8 %
Lymphs Abs: 0.9 10*3/uL (ref 0.7–4.0)
MCH: 31.2 pg (ref 26.0–34.0)
MCHC: 34.5 g/dL (ref 30.0–36.0)
MCV: 90.4 fL (ref 80.0–100.0)
Monocytes Absolute: 0.8 10*3/uL (ref 0.1–1.0)
Monocytes Relative: 7 %
Neutro Abs: 9.1 10*3/uL — ABNORMAL HIGH (ref 1.7–7.7)
Neutrophils Relative %: 84 %
Platelets: 290 10*3/uL (ref 150–400)
RBC: 4.39 MIL/uL (ref 3.87–5.11)
RDW: 14.8 % (ref 11.5–15.5)
WBC: 10.8 10*3/uL — ABNORMAL HIGH (ref 4.0–10.5)
nRBC: 0 % (ref 0.0–0.2)

## 2021-12-16 LAB — BASIC METABOLIC PANEL
Anion gap: 18 — ABNORMAL HIGH (ref 5–15)
Anion gap: 18 — ABNORMAL HIGH (ref 5–15)
BUN: 158 mg/dL — ABNORMAL HIGH (ref 8–23)
BUN: 149 mg/dL — ABNORMAL HIGH (ref 8–23)
CO2: 25 mmol/L (ref 22–32)
CO2: 23 mmol/L (ref 22–32)
Calcium: 8.9 mg/dL (ref 8.9–10.3)
Calcium: 8.5 mg/dL — ABNORMAL LOW (ref 8.9–10.3)
Chloride: 93 mmol/L — ABNORMAL LOW (ref 98–111)
Chloride: 95 mmol/L — ABNORMAL LOW (ref 98–111)
Creatinine, Ser: 5.98 mg/dL — ABNORMAL HIGH (ref 0.44–1.00)
Creatinine, Ser: 5.52 mg/dL — ABNORMAL HIGH (ref 0.44–1.00)
GFR, Estimated: 7 mL/min — ABNORMAL LOW (ref >60)
GFR, Estimated: 8 mL/min — ABNORMAL LOW (ref >60)
Glucose, Bld: 105 mg/dL — ABNORMAL HIGH (ref 70–99)
Glucose, Bld: 97 mg/dL (ref 70–99)
Potassium: 3.8 mmol/L (ref 3.5–5.1)
Potassium: 3.8 mmol/L (ref 3.5–5.1)
Sodium: 136 mmol/L (ref 135–145)
Sodium: 136 mmol/L (ref 135–145)

## 2021-12-16 LAB — PHOSPHORUS: Phosphorus: 5.4 mg/dL — ABNORMAL HIGH (ref 2.5–4.6)

## 2021-12-16 LAB — URINALYSIS, ROUTINE W REFLEX MICROSCOPIC
Bilirubin Urine: NEGATIVE
Glucose, UA: NEGATIVE mg/dL
Hgb urine dipstick: NEGATIVE
Ketones, ur: NEGATIVE mg/dL
Nitrite: NEGATIVE
Protein, ur: NEGATIVE mg/dL
Specific Gravity, Urine: 1.015 (ref 1.005–1.030)
pH: 5.5 (ref 5.0–8.0)

## 2021-12-16 LAB — RESP PANEL BY RT-PCR (FLU A&B, COVID) ARPGX2
Influenza A by PCR: NEGATIVE
Influenza B by PCR: NEGATIVE
SARS Coronavirus 2 by RT PCR: NEGATIVE

## 2021-12-16 LAB — URINALYSIS, MICROSCOPIC (REFLEX): RBC / HPF: NONE SEEN RBC/hpf (ref 0–5)

## 2021-12-16 LAB — MAGNESIUM: Magnesium: 2.4 mg/dL (ref 1.7–2.4)

## 2021-12-16 MED ORDER — TRAZODONE HCL 50 MG PO TABS
25.0000 mg | ORAL_TABLET | Freq: Every day | ORAL | Status: DC
  Administered 2021-12-21 – 2021-12-22 (×2): 25 mg via ORAL
  Filled 2021-12-16 (×3): qty 1

## 2021-12-16 MED ORDER — PREDNISONE 5 MG PO TABS
9.0000 mg | ORAL_TABLET | Freq: Every day | ORAL | Status: DC
  Administered 2021-12-23: 9 mg via ORAL
  Filled 2021-12-16 (×10): qty 4

## 2021-12-16 MED ORDER — HYDROMORPHONE HCL 1 MG/ML IJ SOLN
0.5000 mg | INTRAMUSCULAR | Status: DC | PRN
  Administered 2021-12-16 – 2021-12-19 (×13): 0.5 mg via INTRAVENOUS
  Filled 2021-12-16 (×13): qty 0.5

## 2021-12-16 MED ORDER — ONDANSETRON HCL 4 MG/2ML IJ SOLN: 4.0000 mg | Freq: Four times a day (QID) | INTRAMUSCULAR | Status: DC | PRN

## 2021-12-16 MED ORDER — ALPRAZOLAM 0.25 MG PO TABS
0.2500 mg | ORAL_TABLET | Freq: Every day | ORAL | Status: DC
  Filled 2021-12-16: qty 1

## 2021-12-16 MED ORDER — ENOXAPARIN SODIUM 30 MG/0.3ML IJ SOSY
30.0000 mg | PREFILLED_SYRINGE | INTRAMUSCULAR | Status: DC
  Administered 2021-12-16 – 2021-12-22 (×7): 30 mg via SUBCUTANEOUS
  Filled 2021-12-16 (×7): qty 0.3

## 2021-12-16 MED ORDER — HYDROMORPHONE HCL 1 MG/ML IJ SOLN
1.0000 mg | Freq: Once | INTRAMUSCULAR | Status: AC
  Administered 2021-12-16: 1 mg via INTRAVENOUS
  Filled 2021-12-16: qty 1

## 2021-12-16 MED ORDER — CHLORHEXIDINE GLUCONATE CLOTH 2 % EX PADS
6.0000 | MEDICATED_PAD | Freq: Every day | CUTANEOUS | Status: DC
  Administered 2021-12-16 – 2021-12-22 (×7): 6 via TOPICAL

## 2021-12-16 MED ORDER — SODIUM CHLORIDE 0.9 % IV SOLN
1.0000 g | INTRAVENOUS | Status: DC
  Administered 2021-12-16 – 2021-12-18 (×3): 1 g via INTRAVENOUS
  Filled 2021-12-16 (×3): qty 10

## 2021-12-16 MED ORDER — ONDANSETRON HCL 4 MG PO TABS: 4.0000 mg | ORAL_TABLET | Freq: Four times a day (QID) | ORAL | Status: DC | PRN

## 2021-12-16 MED ORDER — FLUCONAZOLE 100MG IVPB
100.0000 mg | INTRAVENOUS | Status: AC
  Administered 2021-12-16 – 2021-12-22 (×4): 100 mg via INTRAVENOUS
  Filled 2021-12-16 (×4): qty 50

## 2021-12-16 MED ORDER — LACTATED RINGERS IV SOLN: INTRAVENOUS | Status: DC

## 2021-12-16 MED ORDER — ENSURE ENLIVE PO LIQD
237.0000 mL | Freq: Two times a day (BID) | ORAL | Status: DC
  Administered 2021-12-17: 237 mL via ORAL

## 2021-12-16 MED ORDER — FLUCONAZOLE IN SODIUM CHLORIDE 200-0.9 MG/100ML-% IV SOLN
INTRAVENOUS | Status: AC
  Filled 2021-12-16: qty 100

## 2021-12-16 MED ORDER — LORAZEPAM 2 MG/ML IJ SOLN
0.5000 mg | Freq: Four times a day (QID) | INTRAMUSCULAR | Status: DC | PRN
  Administered 2021-12-17 – 2021-12-23 (×14): 0.5 mg via INTRAVENOUS
  Filled 2021-12-16 (×14): qty 1

## 2021-12-16 MED ORDER — SODIUM CHLORIDE 0.9 % IV BOLUS
1000.0000 mL | Freq: Once | INTRAVENOUS | Status: AC
  Administered 2021-12-16: 1000 mL via INTRAVENOUS

## 2021-12-16 NOTE — H&P (Signed)
History and Physical    Patient: Nicole [name] HRC:[id number] DOB: 29-Jul-1946 DOA: 12/16/2021 DOS: the patient was seen and examined on 12/16/2021 PCP: Jake Samples, PA-C  Patient coming from: SNF  Chief Complaint:  Chief Complaint  Patient presents with   Altered Mental Status    HPI: Nicole [name] is a 76 y.o. female with medical history significant of HFpEF, HTN, polymyalgia rheumatica on chronic prednisone, GERN, spinal stenosis. She has been hospitalized twice in the past 3 months for AKI, abdominal pain and psoas abscess. She has been in a SNF for rehab since being discharged in middle of December. Her son gives the history - she was recently placed on antibiotics for a lower leg cellulits stemming from a chronic ulceration. Over the past several days, the patient has had very little oral intake of both food and water.  The doctor was going to place her on Remeron and she got a dose last night.  Today, she started having severe abdominal pain and had some blood work done.  The blood work showed an elevated creatinine and was transported to the hospital for evaluation.  Here, the patient was found to have bladder retention with approximately 500 mL of urine in the bladder.  She was catheterized after given pain medicine.  Review of Systems: As mentioned in the history of present illness. All other systems reviewed and are negative. Past Medical History:  Diagnosis Date   AC (acromioclavicular) joint bone spurs    lt shoulder   Anemia    Arthritis    Carpal tunnel syndrome, bilateral    CHF (congestive heart failure) (HCC)    Collagen vascular disease (HCC)    Gastroesophageal reflux    Headache    recent visit to ER @ [facility] for severe headache   Hypertension    Lumbar stenosis    Hx of ESIs by Dr. Nelva Bush   Polyarthralgia    Polymyalgia [facility])    Shingles    Spinal stenosis    Past Surgical History:  Procedure Laterality Date   BACK SURGERY  07/05/2018, 07/2018   x2     BIOPSY  09/22/2020   Procedure: BIOPSY;  Surgeon: Rogene Houston, MD;  [facility];  Service: Endoscopy;;   BIOPSY  10/05/2021   Procedure: BIOPSY;  Surgeon: Harvel Quale, MD;  [facility];  Service: Gastroenterology;;   CHOLECYSTECTOMY     COLONOSCOPY  06/11/2012   Procedure: COLONOSCOPY;  Surgeon: Jamesetta So, MD;  [facility];  Service: Gastroenterology;  Laterality: N/A;   COLONOSCOPY WITH PROPOFOL N/A 09/22/2020   Procedure: COLONOSCOPY WITH PROPOFOL;  Surgeon: Rogene Houston, MD;  [facility];  Service: Endoscopy;  Laterality: N/A;  730   ESOPHAGOGASTRODUODENOSCOPY (EGD) WITH PROPOFOL N/A 10/05/2021   Procedure: ESOPHAGOGASTRODUODENOSCOPY (EGD) WITH PROPOFOL;  Surgeon: Harvel Quale, MD;  [facility];  Service: Gastroenterology;  Laterality: N/A;   HYSTEROSCOPY WITH D & C N/A 02/25/2014   Procedure: DILATATION AND CURETTAGE /HYSTEROSCOPY;  Surgeon: Florian Buff, MD;  Location: [facility];  Service: Gynecology;  Laterality: N/A;   POLYPECTOMY N/A 02/25/2014   Procedure: POLYPECTOMY;  Surgeon: Florian Buff, MD;  Location: [facility];  Service: Gynecology;  Laterality: N/A;   RESECTION DISTAL CLAVICAL Right 03/26/2015   Procedure: OPEN DISTAL CLAVICAL RESECTION ;  Surgeon: Netta Cedars, MD;  Location: [facility];  Service: Orthopedics;  Laterality: Right;   Social History:  reports that she  has never smoked. She has never used smokeless tobacco. She reports that she does not drink alcohol and does not use drugs.  Allergies  Allergen Reactions   Oxycontin [Oxycodone Hcl] Swelling    Pt tolerates hydromorphone.   Sulfa Antibiotics Other (See Comments)    Sores   Tramadol     "Makes me feel like I am falling"   Penicillins Rash   Sulfasalazine Other (See Comments)    Sores    Family History  Problem Relation Age of Onset   Hypertension Mother    Pneumonia Father    Arthritis Sister    Diabetes Paternal  Grandmother     Prior to Admission medications   Medication Sig Start Date End Date Taking? Authorizing Provider  acetaminophen (TYLENOL) 325 MG tablet Take 2 tablets (650 mg total) by mouth every 6 (six) hours as needed for mild pain or headache (or Fever >/= 101). 05/06/18   Barton Dubois, MD  ALPRAZolam Duanne Moron) 0.25 MG tablet Take 1 tablet (0.25 mg total) by mouth daily as needed. Patient taking differently: Take 0.25 mg by mouth at bedtime. 09/29/21   Mariel Aloe, MD  atorvastatin (LIPITOR) 20 MG tablet Take 20 mg by mouth daily. 01/13/21   [provider]  calcitRIOL (ROCALTROL) 0.25 MCG capsule Take 0.25 mcg by mouth See admin instructions. Take 0.25 every Mon, Wed, Fri. 09/06/20   [provider]  cyclobenzaprine (FLEXERIL) 5 MG tablet Take 5 mg by mouth every 12 (twelve) hours as needed for muscle spasms.  02/20/20   [provider]  diclofenac Sodium (VOLTAREN) 1 % GEL Apply 2 g topically 4 (four) times daily as needed (pain).     [provider]  lactose free nutrition (BOOST) LIQD Take 237 mLs by mouth 3 (three) times daily between meals.    [provider]  leflunomide (ARAVA) 10 MG tablet TAKE (1) TABLET BY MOUTH DAILY. Patient taking differently: Take 10 mg by mouth daily. 12/27/20   Ofilia Neas, PA-C  lidocaine (LIDODERM) 5 % Place 1-2 patches onto the skin every 12 (twelve) hours as needed (pain). 02/19/20   [provider]  melatonin 3 MG TABS tablet Take 3 mg by mouth at bedtime.    [provider]  metolazone (ZAROXOLYN) 5 MG tablet Take 5 mg by mouth 2 (two) times a week. Tuesday and Fridays    [provider]  metoprolol succinate (TOPROL XL) 25 MG 24 hr tablet Take 1 tablet (25 mg total) by mouth daily. 07/21/21 07/21/22  Roxan Hockey, MD  Multiple Vitamins-Minerals (MULTIVITAMIN PO) Take 1 tablet by mouth daily.     [provider]  NUCYNTA ER 200 MG TB12 Take 100 mg by mouth every 12  (twelve) hours. 10/07/21   Orson Eva, MD  ondansetron (ZOFRAN) 4 MG tablet Take 4 mg by mouth every 8 (eight) hours as needed for nausea or vomiting. 09/22/21   [provider]  ondansetron (ZOFRAN) 4 MG tablet Take 1 tablet (4 mg total) by mouth 4 (four) times daily -  before meals and at bedtime. X 3 days 10/07/21   Orson Eva, MD  pantoprazole (PROTONIX) 40 MG tablet Take 1 tablet (40 mg total) by mouth 2 (two) times daily. 10/07/21   Orson Eva, MD  potassium chloride (KLOR-CON) 10 MEQ tablet Take 1 tablet (10 mEq total) by mouth 2 (two) times daily. While taking torsemide 09/28/21 10/28/21  Mariel Aloe, MD  predniSONE (DELTASONE) 1 MG tablet Take 9  tablets (9 mg total) by mouth daily with breakfast. 07/22/21   Roxan Hockey, MD  Simethicone (GAS-X PO) Take 1 tablet by mouth 3 (three) times daily as needed (flatulence).     [provider]  Torsemide 40 MG TABS Take 40 mg by mouth 2 (two) times daily. 09/21/21   Johnson, Clanford L, MD  traZODone (DESYREL) 50 MG tablet Take 25 mg by mouth at bedtime. 09/22/21   [provider]  trolamine salicylate (ASPERCREME) 10 % cream Apply 1 application topically as needed for muscle pain.    [provider]    Physical Exam: Vitals:   12/16/21 1621 12/16/21 1640 12/16/21 1800 12/16/21 1830  BP: (!) 104/57  115/83 106/83  Pulse: (!) 102  96 94  Resp: 13  16   Temp:  (!) 97.4 F (36.3 C)    SpO2: 91%  97% 95%  Weight:      Height:       General: Elderly female. Resting comfortably, but arouses easily. No acute cardiopulmonary distress.  HEENT: Normocephalic atraumatic.  Right and left ears normal in appearance.  Pupils equal, round, reactive to light. Extraocular muscles are intact. Sclerae anicteric and noninjected.  Moist mucosal membranes. No mucosal lesions.  Neck: Neck supple without lymphadenopathy. No carotid bruits. No masses palpated.  Cardiovascular: Regular rate with normal S1-S2 sounds. No murmurs,  rubs, gallops auscultated. No JVD.  Respiratory: Good respiratory effort with no wheezes, rales, rhonchi. Lungs clear to auscultation bilaterally.  No accessory muscle use. Abdomen: Soft, mild tenderness, nondistended. Active bowel sounds. No masses or hepatosplenomegaly  Skin: Healing ulceration on the left lower leg with a black eschar approx 4cm in diameter. No erythema surrounding it.  Dry, warm to touch. 2+ dorsalis pedis and radial pulses. Musculoskeletal: No calf or leg pain. All major joints not erythematous nontender.  No upper or lower joint deformation.  Good ROM.  No contractures  Psychiatric: Intact judgment and insight. Pleasant and cooperative. Neurologic: No focal neurological deficits. Strength is 5/5 and symmetric in upper and lower extremities.  Cranial nerves II through XII are grossly intact.   Data Reviewed: Results for orders placed or performed during the hospital encounter of 12/16/21 (from the past 24 hour(s))  Basic metabolic panel     Status: Abnormal   Collection Time: 12/16/21  4:21 PM  Result Value Ref Range   Sodium 136 135 - 145 mmol/L   Potassium 3.8 3.5 - 5.1 mmol/L   Chloride 93 (L) 98 - 111 mmol/L   CO2 25 22 - 32 mmol/L   Glucose, Bld 105 (H) 70 - 99 mg/dL   BUN 158 (H) 8 - 23 mg/dL   Creatinine, Ser 5.98 (H) 0.44 - 1.00 mg/dL   Calcium 8.9 8.9 - 10.3 mg/dL   GFR, Estimated 7 (L) >60 mL/min   Anion gap 18 (H) 5 - 15  CBC with Differential     Status: Abnormal   Collection Time: 12/16/21  4:21 PM  Result Value Ref Range   WBC 10.8 (H) 4.0 - 10.5 K/uL   RBC 4.39 3.87 - 5.11 MIL/uL   Hemoglobin 13.7 12.0 - 15.0 g/dL   HCT 39.7 36.0 - 46.0 %   MCV 90.4 80.0 - 100.0 fL   MCH 31.2 26.0 - 34.0 pg   MCHC 34.5 30.0 - 36.0 g/dL   RDW 14.8 11.5 - 15.5 %   Platelets 290 150 - 400 K/uL   nRBC 0.0 0.0 - 0.2 %   Neutrophils  Relative % 84 %   Neutro Abs 9.1 (H) 1.7 - 7.7 K/uL   Lymphocytes Relative 8 %   Lymphs Abs 0.9 0.7 - 4.0 K/uL   Monocytes Relative  7 %   Monocytes Absolute 0.8 0.1 - 1.0 K/uL   Eosinophils Relative 0 %   Eosinophils Absolute 0.0 0.0 - 0.5 K/uL   Basophils Relative 0 %   Basophils Absolute 0.0 0.0 - 0.1 K/uL   Immature Granulocytes 1 %   Abs Immature Granulocytes 0.07 0.00 - 0.07 K/uL  Magnesium     Status: None   Collection Time: 12/16/21  4:21 PM  Result Value Ref Range   Magnesium 2.4 1.7 - 2.4 mg/dL  Phosphorus     Status: Abnormal   Collection Time: 12/16/21  4:21 PM  Result Value Ref Range   Phosphorus 5.4 (H) 2.5 - 4.6 mg/dL  Resp Panel by RT-PCR (Flu A&B, Covid) Nasopharyngeal Swab     Status: None   Collection Time: 12/16/21  5:23 PM   Specimen: Nasopharyngeal Swab; Nasopharyngeal(NP) swabs in vial transport medium  Result Value Ref Range   SARS Coronavirus 2 by RT PCR NEGATIVE NEGATIVE   Influenza A by PCR NEGATIVE NEGATIVE   Influenza B by PCR NEGATIVE NEGATIVE  Urinalysis, Routine w reflex microscopic Urine, Catheterized     Status: Abnormal (Preliminary result)   Collection Time: 12/16/21  5:46 PM  Result Value Ref Range   Color, Urine YELLOW YELLOW   APPearance HAZY (A) CLEAR   Specific Gravity, Urine PENDING 1.005 - 1.030   pH PENDING 5.0 - 8.0   Glucose, UA NEGATIVE NEGATIVE mg/dL   Hgb urine dipstick NEGATIVE NEGATIVE   Bilirubin Urine NEGATIVE NEGATIVE   Ketones, ur NEGATIVE NEGATIVE mg/dL   Protein, ur NEGATIVE NEGATIVE mg/dL   Nitrite NEGATIVE NEGATIVE   Leukocytes,Ua MODERATE (A) NEGATIVE  Urinalysis, Microscopic (reflex)     Status: Abnormal   Collection Time: 12/16/21  5:46 PM  Result Value Ref Range   RBC / HPF NONE SEEN 0 - 5 RBC/hpf   WBC, UA 6-10 0 - 5 WBC/hpf   Bacteria, UA RARE (A) NONE SEEN   Squamous Epithelial / LPF 0-5 0 - 5   Budding Yeast PRESENT    CT ABDOMEN PELVIS WO CONTRAST  Result Date: 12/16/2021 CLINICAL DATA:  Abdominal pain, acute, nonlocalized patient poor historian, reporting diffuse abdominal pain, tender over entire abdomen - hx of chronic abd pain  EXAM: CT ABDOMEN AND PELVIS WITHOUT CONTRAST TECHNIQUE: Multidetector CT imaging of the abdomen and pelvis was performed following the standard protocol without IV contrast. RADIATION DOSE REDUCTION: This exam was performed according to the departmental dose-optimization program which includes automated exposure control, adjustment of the mA and/or kV according to patient size and/or use of iterative reconstruction technique. COMPARISON:  10/03/2021 FINDINGS: Lower chest: Bibasilar atelectasis/scarring. Hepatobiliary: No focal liver abnormality is seen. Mild nodularity of the liver surface contour. Status post cholecystectomy. No biliary dilatation. Pancreas: Unremarkable. Spleen: Unremarkable. Adrenals/Urinary Tract: Adrenals and kidneys are unremarkable. Bladder is distended. Stomach/Bowel: Stomach is within normal limits. Bowel is normal in caliber. Mild distal colonic diverticulosis. Normal appendix. Vascular/Lymphatic: Diffuse atherosclerosis.  No enlarged nodes. Reproductive: Stable partially calcified left pelvic mass. Other: No free fluid.  Small fat containing umbilical hernia. Musculoskeletal: Advanced degenerative changes of the spine with prior posterior decompression at lumbar levels and chronic compression deformities. IMPRESSION: No acute abnormality. Bladder is distended.  Correlate for urinary retention. Stable chronic findings detailed above. Electronically Signed  By: Macy Mis M.D.   On: 12/16/2021 17:00     Assessment and Plan: No notes have been filed under this hospital service. Service: Hospitalist  Principal Problem:   Acute renal failure (ARF) (HCC) Active Problems:   GERD (gastroesophageal reflux disease)   HTN (hypertension)   Chronic pain syndrome   Polymyalgia rheumatica (HCC)   Morbid obesity with BMI of 50.0-59.9, adult (HCC)   Chronic diastolic HF (heart failure) ([place])   Acute urinary retention   Leg wound, left   UTI (urinary tract infection)  Acute renal  failure Secondary to dehydration and urinary retention Creatinine 5.9. Patient received 1 L IV fluids Will hydrate with LR 100 mL per hour Recheck creatinine later tonight and tomorrow morning The patient has had previous episodes of AKI, although not quite this severe.  The patient has always responded well to IV fluids.  We will hold off on nephrology consult, but will consult them if does not respond to IV hydration overnight. Acute urinary retention Uncertain whether this is secondary to infection, obstruction, neurogenic bladder. Will obtain bladder scan about every 4 hours.  If it appears that the patient is retaining urine despite getting her up to the commode, may need to place indwelling catheter and consult urology to evaluate for neurogenic bladder. UTI Budding yeast and rare bacteria on UA Send culture Repeat CBC in the morning Start Rocephin and Diflucan.  We will consult pharmacy for Diflucan dosing Left lower leg wound We will check ABI as this appears to be a chronic leg ulcer. Heart failure with preserved EF Patient currently dehydrated.  Will hold diuretics Chronic pain with polymyalgia rheumatica Patient on steroids, which we will continue A.m. cortisol to check for adrenal suppression Hypertension GERD    Advance Care Planning:   Code Status: Prior full code  Consults: none  Family Communication: son present - contributes and provides history  Severity of Illness: The appropriate patient status for this patient is INPATIENT. Inpatient status is judged to be reasonable and necessary in order to provide the required intensity of service to ensure the patient's safety. The patient's presenting symptoms, physical exam findings, and initial radiographic and laboratory data in the context of their chronic comorbidities is felt to place them at high risk for further clinical deterioration. Furthermore, it is not anticipated that the patient will be medically stable for  discharge from the hospital within 2 midnights of admission.   * I certify that at the point of admission it is my clinical judgment that the patient will require inpatient hospital care spanning beyond 2 midnights from the point of admission due to high intensity of service, high risk for further deterioration and high frequency of surveillance required.*  Author: Truett Mainland, DO 12/16/2021 6:52 PM  For on call review [email or web address].

## 2021-12-16 NOTE — Plan of Care (Signed)

## 2021-12-16 NOTE — ED Notes (Signed)
Pt alert and oriented to self and place.

## 2021-12-16 NOTE — ED Triage Notes (Addendum)
Pt bib ems from [facility] for AMS and abnormal labs.  Per labs pt is in renal failure.  No hx of dialysis.  Pt is crying on stretcher. VSS.  Picc line to L upper arm in place.  Per nursing home staff pt is more altered than normal.  Pt will follow simple commands.  Says legs and abd is hurting.  Continous crying noted.  Mucus membranes appear dry.

## 2021-12-16 NOTE — ED Provider Notes (Signed)
[name] Provider Note   CSN: [id number] Arrival date & time: 12/16/21  1455     History  Chief Complaint  Patient presents with   Altered Mental Status    [name] is a 76 y.o. female presenting from a nursing facility with concern for possible dehydration, acute kidney injury, and abdominal pain.  The patient is tearful on arrival cannot provide any history to me.  She nods yes when asked if her abdomen hurts but will not tell me anything further.  She was sent from a nursing facility with concern for elevated creatinine level today, per external records BUN 158, Cr 5.99 (last check Cr 3.5 on 11/08/21).  She has a PICC line in the left arm.  She is not on dialysis.  She was hospitalized in December 2022 per my review of the records, reported to have a history of polymyalgia rheumatica and was complaining of left lower quadrant abdominal pain at that time.  She had also had 2 prior admissions for abdominal pain in November for the same symptoms.  Her most recent CT scan at that time had no significant findings to explain her pain.  Upper endoscopy performed on December 14 which was largely unremarkable.  The patient was given IV fluids in the hospital for an AKI which improved.  She does have a noted history of a left psoas abscess which was felt to be causing some referred pain.  She was started on IV antibiotics through her PICC line; which were continued for 28 days until November 02, 2021.  HPI     Home Medications Prior to Admission medications   Medication Sig Start Date End Date Taking? Authorizing Provider  acetaminophen (TYLENOL) 325 MG tablet Take 2 tablets (650 mg total) by mouth every 6 (six) hours as needed for mild pain or headache (or Fever >/= 101). 05/06/18   Barton Dubois, MD  ALPRAZolam Duanne Moron) 0.25 MG tablet Take 1 tablet (0.25 mg total) by mouth daily as needed. Patient taking differently: Take 0.25 mg by mouth at bedtime. 09/29/21    Mariel Aloe, MD  atorvastatin (LIPITOR) 20 MG tablet Take 20 mg by mouth daily. 01/13/21   [provider]  calcitRIOL (ROCALTROL) 0.25 MCG capsule Take 0.25 mcg by mouth See admin instructions. Take 0.25 every Mon, Wed, Fri. 09/06/20   [provider]  cyclobenzaprine (FLEXERIL) 5 MG tablet Take 5 mg by mouth every 12 (twelve) hours as needed for muscle spasms.  02/20/20   [provider]  diclofenac Sodium (VOLTAREN) 1 % GEL Apply 2 g topically 4 (four) times daily as needed (pain).     [provider]  lactose free nutrition (BOOST) LIQD Take 237 mLs by mouth 3 (three) times daily between meals.    [provider]  leflunomide (ARAVA) 10 MG tablet TAKE (1) TABLET BY MOUTH DAILY. Patient taking differently: Take 10 mg by mouth daily. 12/27/20   Ofilia Neas, PA-C  lidocaine (LIDODERM) 5 % Place 1-2 patches onto the skin every 12 (twelve) hours as needed (pain). 02/19/20   [provider]  melatonin 3 MG TABS tablet Take 3 mg by mouth at bedtime.    [provider]  metolazone (ZAROXOLYN) 5 MG tablet Take 5 mg by mouth 2 (two) times a week. Tuesday and Fridays    [provider]  metoprolol succinate (TOPROL XL) 25 MG 24 hr tablet Take 1 tablet (25 mg total) by mouth daily. 07/21/21 07/21/22  Emokpae,  Courage, MD  Multiple Vitamins-Minerals (MULTIVITAMIN PO) Take 1 tablet by mouth daily.     [provider]  NUCYNTA ER 200 MG TB12 Take 100 mg by mouth every 12 (twelve) hours. 10/07/21   Orson Eva, MD  ondansetron (ZOFRAN) 4 MG tablet Take 4 mg by mouth every 8 (eight) hours as needed for nausea or vomiting. 09/22/21   [provider]  ondansetron (ZOFRAN) 4 MG tablet Take 1 tablet (4 mg total) by mouth 4 (four) times daily -  before meals and at bedtime. X 3 days 10/07/21   Orson Eva, MD  pantoprazole (PROTONIX) 40 MG tablet Take 1 tablet (40 mg total) by mouth 2 (two) times daily. 10/07/21   Orson Eva, MD   potassium chloride (KLOR-CON) 10 MEQ tablet Take 1 tablet (10 mEq total) by mouth 2 (two) times daily. While taking torsemide 09/28/21 10/28/21  Mariel Aloe, MD  predniSONE (DELTASONE) 1 MG tablet Take 9 tablets (9 mg total) by mouth daily with breakfast. 07/22/21   Roxan Hockey, MD  Simethicone (GAS-X PO) Take 1 tablet by mouth 3 (three) times daily as needed (flatulence).     [provider]  Torsemide 40 MG TABS Take 40 mg by mouth 2 (two) times daily. 09/21/21   Johnson, Clanford L, MD  traZODone (DESYREL) 50 MG tablet Take 25 mg by mouth at bedtime. 09/22/21   [provider]  trolamine salicylate (ASPERCREME) 10 % cream Apply 1 application topically as needed for muscle pain.    [provider]      Allergies    Oxycontin [oxycodone hcl], Sulfa antibiotics, Tramadol, Penicillins, and Sulfasalazine    Review of Systems   Review of Systems  Physical Exam Updated Vital Signs BP 119/88    Pulse 65    Temp 97.9 F (36.6 C) (Oral)    Resp (!) 23    Ht 5\' 2"  (1.575 m)    Wt 90.7 kg    SpO2 99%    BMI 36.57 kg/m  Physical Exam Constitutional:      General: She is not in acute distress.    Appearance: She is obese.     Comments: Tearful  HENT:     Head: Normocephalic and atraumatic.  Eyes:     Conjunctiva/sclera: Conjunctivae normal.     Pupils: Pupils are equal, round, and reactive to light.  Cardiovascular:     Rate and Rhythm: Normal rate and regular rhythm.  Pulmonary:     Effort: Pulmonary effort is normal. No respiratory distress.  Abdominal:     Comments: Patient reporting diffuse abdominal tenderness, no rigidity  Musculoskeletal:     Comments: Left arm  Skin:    General: Skin is warm and dry.  Neurological:     General: No focal deficit present.     Mental Status: She is alert. Mental status is at baseline.    ED Results / Procedures / Treatments   Labs (all labs ordered are listed, but only abnormal results are displayed) Labs  Reviewed  BASIC METABOLIC PANEL - Abnormal; Notable for the following components:      Result Value   Chloride 93 (*)    Glucose, Bld 105 (*)    BUN 158 (*)    Creatinine, Ser 5.98 (*)    GFR, Estimated 7 (*)    Anion gap 18 (*)    All other components within normal limits  CBC WITH DIFFERENTIAL/PLATELET - Abnormal; Notable for the following components:   WBC  10.8 (*)    Neutro Abs 9.1 (*)    All other components within normal limits  PHOSPHORUS - Abnormal; Notable for the following components:   Phosphorus 5.4 (*)    All other components within normal limits  URINALYSIS, ROUTINE W REFLEX MICROSCOPIC - Abnormal; Notable for the following components:   APPearance HAZY (*)    Leukocytes,Ua MODERATE (*)    All other components within normal limits  URINALYSIS, MICROSCOPIC (REFLEX) - Abnormal; Notable for the following components:   Bacteria, UA RARE (*)    All other components within normal limits  BASIC METABOLIC PANEL - Abnormal; Notable for the following components:   CO2 21 (*)    BUN 156 (*)    Creatinine, Ser 5.39 (*)    Calcium 8.8 (*)    GFR, Estimated 8 (*)    Anion gap 17 (*)    All other components within normal limits  CBC - Abnormal; Notable for the following components:   WBC 11.1 (*)    All other components within normal limits  BASIC METABOLIC PANEL - Abnormal; Notable for the following components:   Chloride 95 (*)    BUN 149 (*)    Creatinine, Ser 5.52 (*)    Calcium 8.5 (*)    GFR, Estimated 8 (*)    Anion gap 18 (*)    All other components within normal limits  RESP PANEL BY RT-PCR (FLU A&B, COVID) ARPGX2  URINE CULTURE  MRSA NEXT GEN BY PCR, NASAL  MAGNESIUM  CORTISOL-AM, BLOOD    EKG EKG Interpretation  Date/Time:  Friday December 16 2021 15:17:49 EST Ventricular Rate:  115 PR Interval:    QRS Duration: 94 QT Interval:  357 QTC Calculation: 496 R Axis:   -55 Text Interpretation: Sinus rhythm Confirmed by Octaviano Glow [phone]) on  12/16/2021 3:23:49 PM  Radiology CT ABDOMEN PELVIS WO CONTRAST  Result Date: 12/16/2021 CLINICAL DATA:  Abdominal pain, acute, nonlocalized patient poor historian, reporting diffuse abdominal pain, tender over entire abdomen - hx of chronic abd pain EXAM: CT ABDOMEN AND PELVIS WITHOUT CONTRAST TECHNIQUE: Multidetector CT imaging of the abdomen and pelvis was performed following the standard protocol without IV contrast. RADIATION DOSE REDUCTION: This exam was performed according to the departmental dose-optimization program which includes automated exposure control, adjustment of the mA and/or kV according to patient size and/or use of iterative reconstruction technique. COMPARISON:  10/03/2021 FINDINGS: Lower chest: Bibasilar atelectasis/scarring. Hepatobiliary: No focal liver abnormality is seen. Mild nodularity of the liver surface contour. Status post cholecystectomy. No biliary dilatation. Pancreas: Unremarkable. Spleen: Unremarkable. Adrenals/Urinary Tract: Adrenals and kidneys are unremarkable. Bladder is distended. Stomach/Bowel: Stomach is within normal limits. Bowel is normal in caliber. Mild distal colonic diverticulosis. Normal appendix. Vascular/Lymphatic: Diffuse atherosclerosis.  No enlarged nodes. Reproductive: Stable partially calcified left pelvic mass. Other: No free fluid.  Small fat containing umbilical hernia. Musculoskeletal: Advanced degenerative changes of the spine with prior posterior decompression at lumbar levels and chronic compression deformities. IMPRESSION: No acute abnormality. Bladder is distended.  Correlate for urinary retention. Stable chronic findings detailed above. Electronically Signed   By: Macy Mis M.D.   On: 12/16/2021 17:00    Procedures Procedures    Medications Ordered in ED Medications  lactated ringers infusion ( Intravenous New Bag/Given 12/17/21 0736)  traZODone (DESYREL) tablet 25 mg (25 mg Oral Not Given 12/16/21 2057)  predniSONE (DELTASONE)  tablet 9 mg (has no administration in time range)  enoxaparin (LOVENOX) injection 30 mg (30 mg Subcutaneous  Given 12/16/21 2012)  ondansetron (ZOFRAN) tablet 4 mg (has no administration in time range)    Or  ondansetron (ZOFRAN) injection 4 mg (has no administration in time range)  cefTRIAXone (ROCEPHIN) 1 g in sodium chloride 0.9 % 100 mL IVPB (1 g Intravenous Transfusing/Transfer 12/16/21 2012)  fluconazole (DIFLUCAN) IVPB 100 mg (100 mg Intravenous New Bag/Given 12/16/21 2134)  Chlorhexidine Gluconate Cloth 2 % PADS 6 each (0 each Topical Duplicate 11/25/53 9741)  HYDROmorphone (DILAUDID) injection 0.5 mg (0.5 mg Intravenous Given 12/17/21 0848)  LORazepam (ATIVAN) injection 0.5 mg (0.5 mg Intravenous Given 12/17/21 0132)  feeding supplement (ENSURE ENLIVE / ENSURE PLUS) liquid 237 mL (has no administration in time range)  HYDROmorphone (DILAUDID) injection 1 mg (1 mg Intravenous Given 12/16/21 1625)  sodium chloride 0.9 % bolus 1,000 mL (0 mLs Intravenous Stopped 12/16/21 1726)    ED Course/ Medical Decision Making/ A&P Clinical Course as of 12/17/21 1016  Fri Dec 16, 2021  1816 Admitted to hospitalist [MT]    Clinical Course User Index [MT] Langston Masker Carola Rhine, MD                           Medical Decision Making Amount and/or Complexity of Data Reviewed Labs: ordered. Radiology: ordered.  Risk Prescription drug management. Decision regarding hospitalization.   Patient is here with abdominal tenderness, concern for AKI which per her outpatient labs appears to be prerenal.  This is similar to her presentation in December.  Because she cannot provide further history and her abdomen is diffusely tender, I have ordered a CT scan to evaluate for colitis, bladder outlet obstruction, kidney stone, or other evident cause of intra-abdominal pain.  It is possible that is still related to her abscess which was treated with a extensive course of antibiotics.  I have ordered a liter of IV fluids.   We will recheck her creatinine here but based my review of her external records, she does have an AKI again.  Potassium earlier today was still within normal limits.  No emergent indication for dialysis.  IV Dilaudid ordered for pain.  I personally reviewed her prior records including external records including hospital discharge course in summary and CT scans from December 2022.  Supplemental history provided by paramedics on arrival        Final Clinical Impression(s) / ED Diagnoses Final diagnoses:  AKI (acute kidney injury) ([place])  Abdominal pain, unspecified abdominal location  Leg wound, left    Rx / DC Orders ED Discharge Orders     None         Wyvonnia Dusky, MD 12/17/21 1016

## 2021-12-16 NOTE — Progress Notes (Signed)
Pharmacy Antibiotic Note  [name] is a 76 y.o. female SNF resident admitted on 12/16/2021 with AMS, abdominl pain, and AKI. Urinalysis shows yeast. Pharmacy has been consulted for Fluconazole dosing. Noted hx psoas abscess treated extensively with Abx.  Plan: Fluconazole  100mg  IV q 48hrs Monitor renal function closely F/u Ucx  Height: 5\' 2"  (157.5 cm) Weight: 89.7 kg (197 lb 11.2 oz) IBW/kg (Calculated) : 50.1  Temp (24hrs), Avg:97.4 F (36.3 C), Min:97.4 F (36.3 C), Max:97.4 F (36.3 C)  Recent Labs  Lab 12/16/21 1621  WBC 10.8*  CREATININE 5.98*    Estimated Creatinine Clearance: 8.5 mL/min (A) (by C-G formula based on SCr of 5.98 mg/dL (H)).    Allergies  Allergen Reactions   Oxycontin [Oxycodone Hcl] Swelling    Pt tolerates hydromorphone.   Sulfa Antibiotics Other (See Comments)    Sores   Tramadol     "Makes me feel like I am falling"   Penicillins Rash    Tolerated augmentin    Sulfasalazine Other (See Comments)    Sores    Antimicrobials this admission: 2/24 Ceftriaxone >>  2/24 Fluconazole >>   Dose adjustments this admission:  Microbiology results: 2/24 UCx: >>   Thank you for allowing pharmacy to be a part of this patients care.  Lorenso Courier, PharmD Clinical Pharmacist 12/16/2021 7:32 PM

## 2021-12-16 NOTE — ED Notes (Signed)
Went with TL tech to do in and out cath

## 2021-12-17 ENCOUNTER — Inpatient Hospital Stay (HOSPITAL_COMMUNITY): Payer: Medicare Other

## 2021-12-17 DIAGNOSIS — N179 Acute kidney failure, unspecified: Secondary | ICD-10-CM | POA: Diagnosis not present

## 2021-12-17 LAB — BASIC METABOLIC PANEL
Anion gap: 17 — ABNORMAL HIGH (ref 5–15)
BUN: 156 mg/dL — ABNORMAL HIGH (ref 8–23)
CO2: 21 mmol/L — ABNORMAL LOW (ref 22–32)
Calcium: 8.8 mg/dL — ABNORMAL LOW (ref 8.9–10.3)
Chloride: 100 mmol/L (ref 98–111)
Creatinine, Ser: 5.39 mg/dL — ABNORMAL HIGH (ref 0.44–1.00)
GFR, Estimated: 8 mL/min — ABNORMAL LOW (ref >60)
Glucose, Bld: 79 mg/dL (ref 70–99)
Potassium: 3.5 mmol/L (ref 3.5–5.1)
Sodium: 138 mmol/L (ref 135–145)

## 2021-12-17 LAB — CORTISOL-AM, BLOOD: Cortisol - AM: 23.2 ug/dL — ABNORMAL HIGH (ref 6.7–22.6)

## 2021-12-17 LAB — CBC
HCT: 36.7 % (ref 36.0–46.0)
Hemoglobin: 12.4 g/dL (ref 12.0–15.0)
MCH: 31.3 pg (ref 26.0–34.0)
MCHC: 33.8 g/dL (ref 30.0–36.0)
MCV: 92.7 fL (ref 80.0–100.0)
Platelets: 240 10*3/uL (ref 150–400)
RBC: 3.96 MIL/uL (ref 3.87–5.11)
RDW: 14.9 % (ref 11.5–15.5)
WBC: 11.1 10*3/uL — ABNORMAL HIGH (ref 4.0–10.5)
nRBC: 0 % (ref 0.0–0.2)

## 2021-12-17 LAB — MRSA NEXT GEN BY PCR, NASAL: MRSA by PCR Next Gen: NOT DETECTED

## 2021-12-17 MED ORDER — ENSURE ENLIVE PO LIQD
237.0000 mL | Freq: Three times a day (TID) | ORAL | Status: DC
  Administered 2021-12-18: 237 mL via ORAL

## 2021-12-17 MED ORDER — ZINC SULFATE 220 (50 ZN) MG PO CAPS
220.0000 mg | ORAL_CAPSULE | Freq: Every day | ORAL | Status: DC
  Administered 2021-12-23: 220 mg via ORAL
  Filled 2021-12-17: qty 1

## 2021-12-17 MED ORDER — PROSOURCE PLUS PO LIQD: 30.0000 mL | Freq: Two times a day (BID) | ORAL | Status: DC

## 2021-12-17 MED ORDER — ASCORBIC ACID 500 MG PO TABS
500.0000 mg | ORAL_TABLET | Freq: Two times a day (BID) | ORAL | Status: DC
  Administered 2021-12-21 – 2021-12-23 (×3): 500 mg via ORAL
  Filled 2021-12-17 (×3): qty 1

## 2021-12-17 MED ORDER — ADULT MULTIVITAMIN W/MINERALS CH
1.0000 | ORAL_TABLET | Freq: Every day | ORAL | Status: DC
  Administered 2021-12-23: 1 via ORAL
  Filled 2021-12-17: qty 1

## 2021-12-17 NOTE — Plan of Care (Signed)

## 2021-12-17 NOTE — Progress Notes (Signed)
PROGRESS NOTE    Nicole [name]  MHD:[id number] DOB: 12-16-1945 DOA: 12/16/2021 PCP: Jake Samples, PA-C   Brief Narrative:  Per HPI: Nicole [name] is a 76 y.o. female with medical history significant of HFpEF, HTN, polymyalgia rheumatica on chronic prednisone, GERN, spinal stenosis. She has been hospitalized twice in the past 3 months for AKI, abdominal pain and psoas abscess. She has been in a SNF for rehab since being discharged in middle of December. Her son gives the history - she was recently placed on antibiotics for a lower leg cellulits stemming from a chronic ulceration. Over the past several days, the patient has had very little oral intake of both food and water.  The doctor was going to place her on Remeron and she got a dose last night.  Today, she started having severe abdominal pain and had some blood work done.  The blood work showed an elevated creatinine and was transported to the hospital for evaluation.  Here, the patient was found to have bladder retention with approximately 500 mL of urine in the bladder.  She was catheterized after given pain medicine.  12/17/21: Patient was admitted with altered mentation in the setting of UTI as well as acute renal failure with acute urinary retention.  She will require Foley catheter placement today and ongoing IV fluid.  Creatinine slowly downtrending.    Assessment & Plan:   Principal Problem:   Acute renal failure (ARF) (HCC) Active Problems:   GERD (gastroesophageal reflux disease)   HTN (hypertension)   Chronic pain syndrome   Polymyalgia rheumatica (HCC)   Morbid obesity with BMI of 50.0-59.9, adult (HCC)   Chronic diastolic HF (heart failure) ([place])   Acute urinary retention   Leg wound, left   UTI (urinary tract infection)  Assessment and Plan:   AKI-likely postrenal Secondary to dehydration and urinary retention, baseline creatinine 0.8-0.9 Creatinine slowly downtrending Patient received 1 L IV fluids Will  hydrate with LR 100 mL per hour Recheck creatinine later tonight and tomorrow morning The patient has had previous episodes of AKI, although not quite this severe.  The patient has always responded well to IV fluids.  We will hold off on nephrology consult, but will consult them if does not respond to IV hydration by tomorrow a.m. Acute urinary retention Uncertain whether this is secondary to infection, obstruction, neurogenic bladder. Will obtain bladder scan about every 4 hours.  If it appears that the patient is retaining urine despite getting her up to the commode, will place indwelling catheter at this time with plans for urology outpatient follow-up UTI Budding yeast and rare bacteria on UA Send culture Repeat CBC in the morning Continue Rocephin and Diflucan Left lower leg wound ABI pending, no overt signs of cellulitis at this time Heart failure with preserved EF Patient currently dehydrated.  Will hold diuretics Chronic pain with polymyalgia rheumatica Patient on steroids, which we will continue A.m. cortisol to check for adrenal suppression Hypertension GERD Morbid obesity Lifestyle modification    DVT prophylaxis: Lovenox Code Status: Full Family Communication: Discussed with son on phone 2/25 Disposition Plan:  Status is: Inpatient Remains inpatient appropriate because: Ongoing altered mentation with need for IV medications.    Skin Assessment:  I have examined the patients skin and I agree with the wound assessment as performed by the wound care RN as outlined below:  Pressure Injury 12/16/21 Pretibial Left Ulcer (Active)  12/16/21 2100  Location: Pretibial  Location Orientation: Left  Staging:   Wound  Description (Comments): Ulcer  Present on Admission: Yes     Pressure Injury 12/16/21 Buttocks Bilateral Stage 2 -  Partial thickness loss of dermis presenting as a shallow open injury with a red, pink wound bed without slough. (Active)  12/16/21 0210   Location: Buttocks  Location Orientation: Bilateral  Staging: Stage 2 -  Partial thickness loss of dermis presenting as a shallow open injury with a red, pink wound bed without slough.  Wound Description (Comments):   Present on Admission: Yes    Consultants:  None  Procedures:  See below  Antimicrobials:  Anti-infectives (From admission, onward)    Start     Dose/Rate Route Frequency Ordered Stop   12/16/21 2000  cefTRIAXone (ROCEPHIN) 1 g in sodium chloride 0.9 % 100 mL IVPB        1 g 200 mL/hr over 30 Minutes Intravenous Every 24 hours 12/16/21 1903     12/16/21 2000  fluconazole (DIFLUCAN) IVPB 100 mg        100 mg 50 mL/hr over 60 Minutes Intravenous Every 48 hours 12/16/21 1919         Subjective: Patient seen and evaluated today and appears to be in some moderate distress.  Noted to have some agitation in the early morning hours for which she has received some Ativan.  Objective: Vitals:   12/17/21 0715 12/17/21 0736 12/17/21 0800 12/17/21 1108  BP:   119/88   Pulse:   65   Resp: 11 (!) 25 (!) 23 (!) 22  Temp: 97.9 F (36.6 C)   97.7 F (36.5 C)  TempSrc: Oral   Oral  SpO2:  97% 99%   Weight:      Height:        Intake/Output Summary (Last 24 hours) at 12/17/2021 1202 Last data filed at 12/17/2021 0500 Gross per 24 hour  Intake 1925.12 ml  Output 1000 ml  Net 925.12 ml   Filed Weights   12/16/21 1505 12/17/21 0445  Weight: 89.7 kg 90.7 kg    Examination:  General exam: Appears in moderate distress Respiratory system: Clear to auscultation. Respiratory effort normal. Cardiovascular system: S1 & S2 heard, RRR.  Gastrointestinal system: Abdomen is soft Central nervous system: Alert and awake Extremities: No edema Skin: No significant lesions noted Psychiatry: Cannot be assessed.    Data Reviewed: I have personally reviewed following labs and imaging studies  CBC: Recent Labs  Lab 12/16/21 1621 12/17/21 0500  WBC 10.8* 11.1*  NEUTROABS  9.1*  --   HGB 13.7 12.4  HCT 39.7 36.7  MCV 90.4 92.7  PLT 290 301   Basic Metabolic Panel: Recent Labs  Lab 12/16/21 1621 12/16/21 2119 12/17/21 0500  NA 136 136 138  K 3.8 3.8 3.5  CL 93* 95* 100  CO2 25 23 21*  GLUCOSE 105* 97 79  BUN 158* 149* 156*  CREATININE 5.98* 5.52* 5.39*  CALCIUM 8.9 8.5* 8.8*  MG 2.4  --   --   PHOS 5.4*  --   --    GFR: Estimated Creatinine Clearance: 9.4 mL/min (A) (by C-G formula based on SCr of 5.39 mg/dL (H)). Liver Function Tests: No results for input(s): AST, ALT, ALKPHOS, BILITOT, PROT, ALBUMIN in the last 168 hours. No results for input(s): LIPASE, AMYLASE in the last 168 hours. No results for input(s): AMMONIA in the last 168 hours. Coagulation Profile: No results for input(s): INR, PROTIME in the last 168 hours. Cardiac Enzymes: No results for input(s): CKTOTAL, CKMB, CKMBINDEX,  TROPONINI in the last 168 hours. BNP (last 3 results) No results for input(s): PROBNP in the last 8760 hours. HbA1C: No results for input(s): HGBA1C in the last 72 hours. CBG: No results for input(s): GLUCAP in the last 168 hours. Lipid Profile: No results for input(s): CHOL, HDL, LDLCALC, TRIG, CHOLHDL, LDLDIRECT in the last 72 hours. Thyroid Function Tests: No results for input(s): TSH, T4TOTAL, FREET4, T3FREE, THYROIDAB in the last 72 hours. Anemia Panel: No results for input(s): VITAMINB12, FOLATE, FERRITIN, TIBC, IRON, RETICCTPCT in the last 72 hours. Sepsis Labs: No results for input(s): PROCALCITON, LATICACIDVEN in the last 168 hours.  Recent Results (from the past 240 hour(s))  Resp Panel by RT-PCR (Flu A&B, Covid) Nasopharyngeal Swab     Status: None   Collection Time: 12/16/21  5:23 PM   Specimen: Nasopharyngeal Swab; Nasopharyngeal(NP) swabs in vial transport medium  Result Value Ref Range Status   SARS Coronavirus 2 by RT PCR NEGATIVE NEGATIVE Final    Comment: (NOTE) SARS-CoV-2 target nucleic acids are NOT DETECTED.  The SARS-CoV-2  RNA is generally detectable in upper respiratory specimens during the acute phase of infection. The lowest concentration of SARS-CoV-2 viral copies this assay can detect is 138 copies/mL. A negative result does not preclude SARS-Cov-2 infection and should not be used as the sole basis for treatment or other patient management decisions. A negative result may occur with  improper specimen collection/handling, submission of specimen other than nasopharyngeal swab, presence of viral mutation(s) within the areas targeted by this assay, and inadequate number of viral copies(<138 copies/mL). A negative result must be combined with clinical observations, patient history, and epidemiological information. The expected result is Negative.  Fact Sheet for Patients:  EntrepreneurPulse.com.au  Fact Sheet for Healthcare Providers:  IncredibleEmployment.be  This test is no t yet approved or cleared by the [place] FDA and  has been authorized for detection and/or diagnosis of SARS-CoV-2 by FDA under an Emergency Use Authorization (EUA). This EUA will remain  in effect (meaning this test can be used) for the duration of the COVID-19 declaration under Section 564(b)(1) of the Act, 21 U.S.C.section 360bbb-3(b)(1), unless the authorization is terminated  or revoked sooner.       Influenza A by PCR NEGATIVE NEGATIVE Final   Influenza B by PCR NEGATIVE NEGATIVE Final    Comment: (NOTE) The Xpert Xpress SARS-CoV-2/FLU/RSV plus assay is intended as an aid in the diagnosis of influenza from Nasopharyngeal swab specimens and should not be used as a sole basis for treatment. Nasal washings and aspirates are unacceptable for Xpert Xpress SARS-CoV-2/FLU/RSV testing.  Fact Sheet for Patients: EntrepreneurPulse.com.au  Fact Sheet for Healthcare Providers: IncredibleEmployment.be  This test is not yet approved or cleared by the  [place] FDA and has been authorized for detection and/or diagnosis of SARS-CoV-2 by FDA under an Emergency Use Authorization (EUA). This EUA will remain in effect (meaning this test can be used) for the duration of the COVID-19 declaration under Section 564(b)(1) of the Act, 21 U.S.C. section 360bbb-3(b)(1), unless the authorization is terminated or revoked.  Performed at [facility], [address], [place], [place] [address]          Radiology Studies: CT ABDOMEN PELVIS WO CONTRAST  Result Date: 12/16/2021 CLINICAL DATA:  Abdominal pain, acute, nonlocalized patient poor historian, reporting diffuse abdominal pain, tender over entire abdomen - hx of chronic abd pain EXAM: CT ABDOMEN AND PELVIS WITHOUT CONTRAST TECHNIQUE: Multidetector CT imaging of the abdomen and pelvis was performed  following the standard protocol without IV contrast. RADIATION DOSE REDUCTION: This exam was performed according to the departmental dose-optimization program which includes automated exposure control, adjustment of the mA and/or kV according to patient size and/or use of iterative reconstruction technique. COMPARISON:  10/03/2021 FINDINGS: Lower chest: Bibasilar atelectasis/scarring. Hepatobiliary: No focal liver abnormality is seen. Mild nodularity of the liver surface contour. Status post cholecystectomy. No biliary dilatation. Pancreas: Unremarkable. Spleen: Unremarkable. Adrenals/Urinary Tract: Adrenals and kidneys are unremarkable. Bladder is distended. Stomach/Bowel: Stomach is within normal limits. Bowel is normal in caliber. Mild distal colonic diverticulosis. Normal appendix. Vascular/Lymphatic: Diffuse atherosclerosis.  No enlarged nodes. Reproductive: Stable partially calcified left pelvic mass. Other: No free fluid.  Small fat containing umbilical hernia. Musculoskeletal: Advanced degenerative changes of the spine with prior posterior decompression at lumbar levels and chronic compression  deformities. IMPRESSION: No acute abnormality. Bladder is distended.  Correlate for urinary retention. Stable chronic findings detailed above. Electronically Signed   By: Macy Mis M.D.   On: 12/16/2021 17:00   US ARTERIAL ABI (SCREENING LOWER EXTREMITY)  Result Date: 12/17/2021 CLINICAL DATA:  Lower extremity ulcers. EXAM: NONINVASIVE PHYSIOLOGIC VASCULAR STUDY OF BILATERAL LOWER EXTREMITIES TECHNIQUE: Evaluation of both lower extremities were performed at rest, including calculation of ankle-brachial indices with single level Doppler, pressure and pulse volume recording. COMPARISON:  None. FINDINGS: Right ABI:  0.8 Left ABI:  0.75 Right Lower Extremity: Monophasic dorsalis pedis waveform. Posterior tibial waveform could not be obtained. Left Lower Extremity: Monophasic dorsalis pedis waveform. Posterior tibial waveform could not be obtained. 0.5-0.79 Moderate PAD IMPRESSION: Evidence of peripheral vascular disease in both lower extremities with resting ankle-brachial indices of 0.8 on the right and 0.75 on the left. Bilateral dorsalis pedis waveforms are monophasic. Posterior tibial waveforms could not be obtained bilaterally. Electronically Signed   By: Aletta Edouard M.D.   On: 12/17/2021 11:09        Scheduled Meds:  Chlorhexidine Gluconate Cloth  6 each Topical Q0600   enoxaparin (LOVENOX) injection  30 mg Subcutaneous Q24H   feeding supplement  237 mL Oral BID BM   predniSONE  9 mg Oral Q breakfast   traZODone  25 mg Oral QHS   Continuous Infusions:  cefTRIAXone (ROCEPHIN)  IV 1 g (12/16/21 2011)   fluconazole (DIFLUCAN) IV 100 mg (12/16/21 2134)   lactated ringers 100 mL/hr at 12/17/21 0736     LOS: 1 day    Time spent: 35 minutes    Krysteena Stalker D Manuella Ghazi, DO [facility]  If 7PM-7AM, please contact night-coverage [email or web address] 12/17/2021, 12:02 PM

## 2021-12-17 NOTE — Hospital Course (Addendum)
Per HPI: Nicole [name] is a 76 y.o. female with medical history significant of HFpEF, HTN, polymyalgia rheumatica on chronic prednisone, GERN, spinal stenosis. She has been hospitalized twice in the past 3 months for AKI, abdominal pain and psoas abscess. She has been in a SNF for rehab since being discharged in middle of December. Her son gives the history - she was recently placed on antibiotics for a lower leg cellulits stemming from a chronic ulceration. Over the past several days, the patient has had very little oral intake of both food and water.  The doctor was going to place her on Remeron and she got a dose last night.  Today, she started having severe abdominal pain and had some blood work done.  The blood work showed an elevated creatinine and was transported to the hospital for evaluation.  Here, the patient was found to have bladder retention with approximately 500 mL of urine in the bladder.  She was catheterized after given pain medicine.  12/17/21: Patient was admitted with altered mentation in the setting of UTI as well as acute renal failure with acute urinary retention.  She will require Foley catheter placement today and ongoing IV fluid.  Creatinine slowly downtrending.  12/18/21: Patient continues to have altered mentation, but creatinine levels show signs of improvement in with decent urine output.  She continues to remain on treatment for her UTI.  12/19/21: Patient becomes tearful and is and is otherwise somnolent.  Creatinine levels are gradually improving.  She will require D5 IV fluid to help maintain her blood glucose levels.  Rocephin will be changed to Merrem due to noted ESBL in urine.  12/20/21: Patient continues to remain quite altered, but creatinine levels are gradually improving.  NG tube to be inserted with tube feedings to initiate today.  Continue ongoing treatment with antibiotics for now as ordered and may consider discontinuation in the next 1-2 days.

## 2021-12-17 NOTE — Progress Notes (Addendum)
Initial Nutrition Assessment  DOCUMENTATION CODES:   Obesity unspecified  INTERVENTION:   -MVI with minerals daily -Increase Ensure Enlive po to TID, each supplement provides 350 kcal and 20 grams of protein -30 ml Prosource Plus BID, each supplement provides 100 kcals and 15 grams protein -500 mg vitamin C BID -220 mg zinc sulfate daily x 14 days -Liberalize diet to regular to provide widest variety of food selections  NUTRITION DIAGNOSIS:   Increased nutrient needs related to wound healing as evidenced by estimated needs.  GOAL:   Patient will meet greater than or equal to 90% of their needs  MONITOR:   PO intake, Supplement acceptance, Labs, Weight trends, Skin, I & O's  REASON FOR ASSESSMENT:   Malnutrition Screening Tool    ASSESSMENT:   Nicole [name] is a 76 y.o. female with medical history significant of HFpEF, HTN, polymyalgia rheumatica on chronic prednisone, GERN, spinal stenosis. She has been hospitalized twice in the past 3 months for AKI, abdominal pain and psoas abscess. She has been in a SNF for rehab since being discharged in middle of December. Her son gives the history - she was recently placed on antibiotics for a lower leg cellulits stemming from a chronic ulceration. Over the past several days, the patient has had very little oral intake of both food and water.  The doctor was going to place her on Remeron and she got a dose last night.  Today, she started having severe abdominal pain and had some blood work done.  The blood work showed an elevated creatinine and was transported to the hospital for evaluation.  Here, the patient was found to have bladder retention with approximately 500 mL of urine in the bladder.  She was catheterized after given pain medicine.  Pt admitted with acute renal failure secondary to dehydration and urinary retention.   Reviewed I/O's: +925 ml x 24 hours  UOP: 1 L x 24 hours  Pt unavailable at time of visit. Attempted to speak  with pt via call to hospital room phone, however, unable to reach. RD unable to obtain further nutrition-related history or complete nutrition-focused physical exam at this time.    Per H&P, pt has been eating poorly for several days PTA. Pt is currently on a heart healthy diet. No meal completion data available to assess at this time. Ensure supplements have been ordered and pt has been taking per MAR.   Reviewed wt hx;pt has experienced a 17.2% wt loss over the past 3 months, which is significant for time frame.   Pt with increased nutritional needs for wound healing. Pt would benefit from addition of oral nutrition supplements. RD will also liberalize diet to provide widest variety of food selections.   Medications reviewed and include prednisone and lactated ringers @ 100 ml/hr.   Labs reviewed.   Diet Order:   Diet Order             Diet Heart Room service appropriate? Yes; Fluid consistency: Thin  Diet effective now                   EDUCATION NEEDS:   No education needs have been identified at this time  Skin:  Skin Assessment: Skin Integrity Issues: Skin Integrity Issues:: Stage II, Other (Comment) Stage II: buttocks Other: pressure ulcer to lt pretibial  Last BM:  12/16/21  Height:   Ht Readings from Last 1 Encounters:  12/16/21 5\' 2"  (1.575 m)    Weight:   Wt  Readings from Last 1 Encounters:  12/17/21 90.7 kg    Ideal Body Weight:  50 kg  BMI:  Body mass index is 36.57 kg/m.  Estimated Nutritional Needs:   Kcal:  1800-2000  Protein:  110-125 grams  Fluid:  > 1.8 L    Loistine Chance, RD, LDN, [place] Registered Dietitian II Certified Diabetes Care and Education Specialist Please refer to [facility] for RD and/or RD on-call/weekend/after hours pager

## 2021-12-18 DIAGNOSIS — N179 Acute kidney failure, unspecified: Secondary | ICD-10-CM | POA: Diagnosis not present

## 2021-12-18 LAB — CBC
HCT: 33.3 % — ABNORMAL LOW (ref 36.0–46.0)
Hemoglobin: 10.9 g/dL — ABNORMAL LOW (ref 12.0–15.0)
MCH: 30.6 pg (ref 26.0–34.0)
MCHC: 32.7 g/dL (ref 30.0–36.0)
MCV: 93.5 fL (ref 80.0–100.0)
Platelets: 191 10*3/uL (ref 150–400)
RBC: 3.56 MIL/uL — ABNORMAL LOW (ref 3.87–5.11)
RDW: 14.9 % (ref 11.5–15.5)
WBC: 9.6 10*3/uL (ref 4.0–10.5)
nRBC: 0 % (ref 0.0–0.2)

## 2021-12-18 LAB — BASIC METABOLIC PANEL
Anion gap: 14 (ref 5–15)
BUN: 113 mg/dL — ABNORMAL HIGH (ref 8–23)
CO2: 25 mmol/L (ref 22–32)
Calcium: 8.8 mg/dL — ABNORMAL LOW (ref 8.9–10.3)
Chloride: 103 mmol/L (ref 98–111)
Creatinine, Ser: 4.75 mg/dL — ABNORMAL HIGH (ref 0.44–1.00)
GFR, Estimated: 9 mL/min — ABNORMAL LOW (ref >60)
Glucose, Bld: 70 mg/dL (ref 70–99)
Potassium: 2.6 mmol/L — CL (ref 3.5–5.1)
Sodium: 142 mmol/L (ref 135–145)

## 2021-12-18 LAB — MAGNESIUM: Magnesium: 2.2 mg/dL (ref 1.7–2.4)

## 2021-12-18 MED ORDER — POTASSIUM CHLORIDE 10 MEQ/100ML IV SOLN
10.0000 meq | INTRAVENOUS | Status: AC
  Administered 2021-12-18 (×3): 10 meq via INTRAVENOUS
  Filled 2021-12-18 (×3): qty 100

## 2021-12-18 MED ORDER — POTASSIUM CHLORIDE 10 MEQ/100ML IV SOLN: 10.0000 meq | INTRAVENOUS | Status: DC

## 2021-12-18 NOTE — Progress Notes (Signed)
Lying in bed crying and will not talk to me or to NT. Attempted to give water but would not drink. Prednisone ordered attempted to give apple sauce to see if she would try to swallow but she would not. As of this note it is to early to give more pain medication. At this time.

## 2021-12-18 NOTE — Plan of Care (Signed)

## 2021-12-18 NOTE — Progress Notes (Signed)
PROGRESS NOTE    Nicole [name]  RCB:[id number] DOB: [birth year] DOA: 12/16/2021 PCP: Jake Samples, PA-C   Brief Narrative:  Per HPI: Nicole [name] is a 76 y.o. female with medical history significant of HFpEF, HTN, polymyalgia rheumatica on chronic prednisone, GERN, spinal stenosis. She has been hospitalized twice in the past 3 months for AKI, abdominal pain and psoas abscess. She has been in a SNF for rehab since being discharged in middle of December. Her son gives the history - she was recently placed on antibiotics for a lower leg cellulits stemming from a chronic ulceration. Over the past several days, the patient has had very little oral intake of both food and water.  The doctor was going to place her on Remeron and she got a dose last night.  Today, she started having severe abdominal pain and had some blood work done.  The blood work showed an elevated creatinine and was transported to the hospital for evaluation.  Here, the patient was found to have bladder retention with approximately 500 mL of urine in the bladder.  She was catheterized after given pain medicine.  12/17/21: Patient was admitted with altered mentation in the setting of UTI as well as acute renal failure with acute urinary retention.  She will require Foley catheter placement today and ongoing IV fluid.  Creatinine slowly downtrending.  12/18/21: Patient continues to have altered mentation, but creatinine levels show signs of improvement in with decent urine output.  She continues to remain on treatment for her UTI.    Assessment & Plan:   Principal Problem:   Acute renal failure (ARF) (HCC) Active Problems:   GERD (gastroesophageal reflux disease)   HTN (hypertension)   Chronic pain syndrome   Polymyalgia rheumatica (HCC)   Morbid obesity with BMI of 50.0-59.9, adult (HCC)   Chronic diastolic HF (heart failure) ([place])   Acute urinary retention   Leg wound, left   UTI (urinary tract infection)  Assessment and  Plan:    AKI-likely postrenal Secondary to dehydration and urinary retention, baseline creatinine 0.8-0.9 Creatinine slowly downtrending Patient received 1 L IV fluids Will hydrate with LR 100 mL per hour Recheck creatinine later tonight and tomorrow morning The patient has had previous episodes of AKI, although not quite this severe.  The patient has always responded well to IV fluids.  We will hold off on nephrology consult, for now Acute urinary retention Uncertain whether this is secondary to infection, obstruction, neurogenic bladder. Will obtain bladder scan about every 4 hours.  If it appears that the patient is retaining urine despite getting her up to the commode, will place indwelling catheter at this time with plans for urology outpatient follow-up Acute metabolic encephalopathy ongoing secondary to E Coli and Candida UTI Budding yeast and rare bacteria on UA Culture with E. coli and minimal growth with sensitivities pending Repeat CBC in the morning Continue Rocephin and Diflucan Continue to monitor neurologically for signs of improvement Left lower leg wound ABI with moderate PAD noted with no overt signs of cellulitis.  Will need vascular follow-up. Heart failure with preserved EF Patient currently dehydrated.  Will hold diuretics Chronic pain with polymyalgia rheumatica Patient on steroids, which we will continue Hypertension GERD Morbid obesity Lifestyle modification     DVT prophylaxis: Lovenox Code Status: Full Family Communication: Discussed with son on phone 2/26 Disposition Plan:  Status is: Inpatient Remains inpatient appropriate because: Ongoing altered mentation with need for IV medications.       Skin  Assessment:   I have examined the patients skin and I agree with the wound assessment as performed by the wound care RN as outlined below:   Pressure Injury 12/16/21 Pretibial Left Ulcer (Active)  12/16/21 2100  Location: Pretibial  Location  Orientation: Left  Staging:   Wound Description (Comments): Ulcer  Present on Admission: Yes     Pressure Injury 12/16/21 Buttocks Bilateral Stage 2 -  Partial thickness loss of dermis presenting as a shallow open injury with a red, pink wound bed without slough. (Active)  12/16/21 0210  Location: Buttocks  Location Orientation: Bilateral  Staging: Stage 2 -  Partial thickness loss of dermis presenting as a shallow open injury with a red, pink wound bed without slough.  Wound Description (Comments):   Present on Admission: Yes      Consultants:  None   Procedures:  See below   Antimicrobials:  Anti-infectives (From admission, onward)    Start     Dose/Rate Route Frequency Ordered Stop   12/16/21 2000  cefTRIAXone (ROCEPHIN) 1 g in sodium chloride 0.9 % 100 mL IVPB        1 g 200 mL/hr over 30 Minutes Intravenous Every 24 hours 12/16/21 1903     12/16/21 2000  fluconazole (DIFLUCAN) IVPB 100 mg        100 mg 50 mL/hr over 60 Minutes Intravenous Every 48 hours 12/16/21 1919         Subjective: Patient seen and evaluated today with continued altered mentation and no acute overnight events noted.  She appears to have had close to a liter of urine output in the last 24 hours.  Objective: Vitals:   12/18/21 0500 12/18/21 0600 12/18/21 0700 12/18/21 0740  BP: (!) 107/57 (!) 91/52 (!) 88/58   Pulse: (!) 103 100 95 100  Resp: 11 11 12 19   Temp:    98.5 F (36.9 C)  TempSrc:    Axillary  SpO2: 100% 100% 100% 100%  Weight:      Height:        Intake/Output Summary (Last 24 hours) at 12/18/2021 1019 Last data filed at 12/18/2021 0800 Gross per 24 hour  Intake 2614.25 ml  Output 750 ml  Net 1864.25 ml   Filed Weights   12/16/21 1505 12/17/21 0445  Weight: 89.7 kg 90.7 kg    Examination:  General exam: Appears confused, obese Respiratory system: Clear to auscultation. Respiratory effort normal. Cardiovascular system: S1 & S2 heard, RRR.  Gastrointestinal system:  Abdomen is soft Central nervous system: Mostly somnolent Extremities: No edema Skin: No significant lesions noted Psychiatry: Flat affect. Foley catheter with clear, yellow urine output    Data Reviewed: I have personally reviewed following labs and imaging studies  CBC: Recent Labs  Lab 12/16/21 1621 12/17/21 0500 12/18/21 0418  WBC 10.8* 11.1* 9.6  NEUTROABS 9.1*  --   --   HGB 13.7 12.4 10.9*  HCT 39.7 36.7 33.3*  MCV 90.4 92.7 93.5  PLT 290 240 979   Basic Metabolic Panel: Recent Labs  Lab 12/16/21 1621 12/16/21 2119 12/17/21 0500 12/18/21 0418  NA 136 136 138 142  K 3.8 3.8 3.5 2.6*  CL 93* 95* 100 103  CO2 25 23 21* 25  GLUCOSE 105* 97 79 70  BUN 158* 149* 156* 113*  CREATININE 5.98* 5.52* 5.39* 4.75*  CALCIUM 8.9 8.5* 8.8* 8.8*  MG 2.4  --   --  2.2  PHOS 5.4*  --   --   --  GFR: Estimated Creatinine Clearance: 10.7 mL/min (A) (by C-G formula based on SCr of 4.75 mg/dL (H)). Liver Function Tests: No results for input(s): AST, ALT, ALKPHOS, BILITOT, PROT, ALBUMIN in the last 168 hours. No results for input(s): LIPASE, AMYLASE in the last 168 hours. No results for input(s): AMMONIA in the last 168 hours. Coagulation Profile: No results for input(s): INR, PROTIME in the last 168 hours. Cardiac Enzymes: No results for input(s): CKTOTAL, CKMB, CKMBINDEX, TROPONINI in the last 168 hours. BNP (last 3 results) No results for input(s): PROBNP in the last 8760 hours. HbA1C: No results for input(s): HGBA1C in the last 72 hours. CBG: No results for input(s): GLUCAP in the last 168 hours. Lipid Profile: No results for input(s): CHOL, HDL, LDLCALC, TRIG, CHOLHDL, LDLDIRECT in the last 72 hours. Thyroid Function Tests: No results for input(s): TSH, T4TOTAL, FREET4, T3FREE, THYROIDAB in the last 72 hours. Anemia Panel: No results for input(s): VITAMINB12, FOLATE, FERRITIN, TIBC, IRON, RETICCTPCT in the last 72 hours. Sepsis Labs: No results for input(s):  PROCALCITON, LATICACIDVEN in the last 168 hours.  Recent Results (from the past 240 hour(s))  Resp Panel by RT-PCR (Flu A&B, Covid) Nasopharyngeal Swab     Status: None   Collection Time: 12/16/21  5:23 PM   Specimen: Nasopharyngeal Swab; Nasopharyngeal(NP) swabs in vial transport medium  Result Value Ref Range Status   SARS Coronavirus 2 by RT PCR NEGATIVE NEGATIVE Final    Comment: (NOTE) SARS-CoV-2 target nucleic acids are NOT DETECTED.  The SARS-CoV-2 RNA is generally detectable in upper respiratory specimens during the acute phase of infection. The lowest concentration of SARS-CoV-2 viral copies this assay can detect is 138 copies/mL. A negative result does not preclude SARS-Cov-2 infection and should not be used as the sole basis for treatment or other patient management decisions. A negative result may occur with  improper specimen collection/handling, submission of specimen other than nasopharyngeal swab, presence of viral mutation(s) within the areas targeted by this assay, and inadequate number of viral copies(<138 copies/mL). A negative result must be combined with clinical observations, patient history, and epidemiological information. The expected result is Negative.  Fact Sheet for Patients:  EntrepreneurPulse.com.au  Fact Sheet for Healthcare Providers:  IncredibleEmployment.be  This test is no t yet approved or cleared by the [place] FDA and  has been authorized for detection and/or diagnosis of SARS-CoV-2 by FDA under an Emergency Use Authorization (EUA). This EUA will remain  in effect (meaning this test can be used) for the duration of the COVID-19 declaration under Section 564(b)(1) of the Act, 21 U.S.C.section 360bbb-3(b)(1), unless the authorization is terminated  or revoked sooner.       Influenza A by PCR NEGATIVE NEGATIVE Final   Influenza B by PCR NEGATIVE NEGATIVE Final    Comment: (NOTE) The Xpert Xpress  SARS-CoV-2/FLU/RSV plus assay is intended as an aid in the diagnosis of influenza from Nasopharyngeal swab specimens and should not be used as a sole basis for treatment. Nasal washings and aspirates are unacceptable for Xpert Xpress SARS-CoV-2/FLU/RSV testing.  Fact Sheet for Patients: EntrepreneurPulse.com.au  Fact Sheet for Healthcare Providers: IncredibleEmployment.be  This test is not yet approved or cleared by the [place] FDA and has been authorized for detection and/or diagnosis of SARS-CoV-2 by FDA under an Emergency Use Authorization (EUA). This EUA will remain in effect (meaning this test can be used) for the duration of the COVID-19 declaration under Section 564(b)(1) of the Act, 21 U.S.C. section 360bbb-3(b)(1), unless the authorization  is terminated or revoked.  Performed at [facility], [address] [place], [place] [address]   Urine Culture     Status: Abnormal (Preliminary result)   Collection Time: 12/16/21  5:46 PM   Specimen: Urine, Catheterized  Result Value Ref Range Status   Specimen Description   Final    URINE, CATHETERIZED Performed at [facility], [address] [place], [place] [address]    Special Requests   Final    NONE Performed at [facility], [address] [place], [place] [address]    Culture (A)  Final    3,000 COLONIES/mL ESCHERICHIA COLI SUSCEPTIBILITIES TO FOLLOW Performed at [facility], [place] [address] [place], [place] [address]    Report Status PENDING  Incomplete  MRSA Next Gen by PCR, Nasal     Status: None   Collection Time: 12/16/21  8:12 PM   Specimen: Nasal Mucosa; Nasal Swab  Result Value Ref Range Status   MRSA by PCR Next Gen NOT DETECTED NOT DETECTED Final    Comment: (NOTE) The GeneXpert MRSA Assay (FDA approved for NASAL specimens only), is one component of a comprehensive MRSA colonization surveillance program. It is not intended to diagnose MRSA infection nor to  guide or monitor treatment for MRSA infections. Test performance is not FDA approved in patients less than 48 years old. Performed at [facility], [address], [place], [place] [address]          Radiology Studies: CT ABDOMEN PELVIS WO CONTRAST  Result Date: 12/16/2021 CLINICAL DATA:  Abdominal pain, acute, nonlocalized patient poor historian, reporting diffuse abdominal pain, tender over entire abdomen - hx of chronic abd pain EXAM: CT ABDOMEN AND PELVIS WITHOUT CONTRAST TECHNIQUE: Multidetector CT imaging of the abdomen and pelvis was performed following the standard protocol without IV contrast. RADIATION DOSE REDUCTION: This exam was performed according to the departmental dose-optimization program which includes automated exposure control, adjustment of the mA and/or kV according to patient size and/or use of iterative reconstruction technique. COMPARISON:  10/03/2021 FINDINGS: Lower chest: Bibasilar atelectasis/scarring. Hepatobiliary: No focal liver abnormality is seen. Mild nodularity of the liver surface contour. Status post cholecystectomy. No biliary dilatation. Pancreas: Unremarkable. Spleen: Unremarkable. Adrenals/Urinary Tract: Adrenals and kidneys are unremarkable. Bladder is distended. Stomach/Bowel: Stomach is within normal limits. Bowel is normal in caliber. Mild distal colonic diverticulosis. Normal appendix. Vascular/Lymphatic: Diffuse atherosclerosis.  No enlarged nodes. Reproductive: Stable partially calcified left pelvic mass. Other: No free fluid.  Small fat containing umbilical hernia. Musculoskeletal: Advanced degenerative changes of the spine with prior posterior decompression at lumbar levels and chronic compression deformities. IMPRESSION: No acute abnormality. Bladder is distended.  Correlate for urinary retention. Stable chronic findings detailed above. Electronically Signed   By: Macy Mis M.D.   On: 12/16/2021 17:00   US ARTERIAL ABI (SCREENING LOWER  EXTREMITY)  Result Date: 12/17/2021 CLINICAL DATA:  Lower extremity ulcers. EXAM: NONINVASIVE PHYSIOLOGIC VASCULAR STUDY OF BILATERAL LOWER EXTREMITIES TECHNIQUE: Evaluation of both lower extremities were performed at rest, including calculation of ankle-brachial indices with single level Doppler, pressure and pulse volume recording. COMPARISON:  None. FINDINGS: Right ABI:  0.8 Left ABI:  0.75 Right Lower Extremity: Monophasic dorsalis pedis waveform. Posterior tibial waveform could not be obtained. Left Lower Extremity: Monophasic dorsalis pedis waveform. Posterior tibial waveform could not be obtained. 0.5-0.79 Moderate PAD IMPRESSION: Evidence of peripheral vascular disease in both lower extremities with resting ankle-brachial indices of 0.8 on the right and 0.75 on the left. Bilateral dorsalis  pedis waveforms are monophasic. Posterior tibial waveforms could not be obtained bilaterally. Electronically Signed   By: Aletta Edouard M.D.   On: 12/17/2021 11:09        Scheduled Meds:  (feeding supplement) PROSource Plus  30 mL Oral BID BM   vitamin C  500 mg Oral BID   Chlorhexidine Gluconate Cloth  6 each Topical Q0600   enoxaparin (LOVENOX) injection  30 mg Subcutaneous Q24H   feeding supplement  237 mL Oral TID BM   multivitamin with minerals  1 tablet Oral Daily   predniSONE  9 mg Oral Q breakfast   traZODone  25 mg Oral QHS   zinc sulfate  220 mg Oral Daily   Continuous Infusions:  cefTRIAXone (ROCEPHIN)  IV 1 g (12/17/21 1904)   fluconazole (DIFLUCAN) IV 100 mg (12/16/21 2134)   lactated ringers 100 mL/hr at 12/18/21 0412     LOS: 2 days    Time spent: 35 minutes    Zakhi Dupre D Manuella Ghazi, DO [facility]  If 7PM-7AM, please contact night-coverage [email or web address] 12/18/2021, 10:19 AM

## 2021-12-19 DIAGNOSIS — N179 Acute kidney failure, unspecified: Secondary | ICD-10-CM | POA: Diagnosis not present

## 2021-12-19 LAB — URINE CULTURE: Culture: 3000 — AB

## 2021-12-19 LAB — CBC
HCT: 36.9 % (ref 36.0–46.0)
Hemoglobin: 11.6 g/dL — ABNORMAL LOW (ref 12.0–15.0)
MCH: 30.2 pg (ref 26.0–34.0)
MCHC: 31.4 g/dL (ref 30.0–36.0)
MCV: 96.1 fL (ref 80.0–100.0)
Platelets: 182 K/uL (ref 150–400)
RBC: 3.84 MIL/uL — ABNORMAL LOW (ref 3.87–5.11)
RDW: 15.1 % (ref 11.5–15.5)
WBC: 9.2 K/uL (ref 4.0–10.5)
nRBC: 0 % (ref 0.0–0.2)

## 2021-12-19 LAB — BASIC METABOLIC PANEL
Anion gap: 16 — ABNORMAL HIGH (ref 5–15)
BUN: 118 mg/dL — ABNORMAL HIGH (ref 8–23)
CO2: 23 mmol/L (ref 22–32)
Calcium: 8.9 mg/dL (ref 8.9–10.3)
Chloride: 106 mmol/L (ref 98–111)
Creatinine, Ser: 3.74 mg/dL — ABNORMAL HIGH (ref 0.44–1.00)
GFR, Estimated: 12 mL/min — ABNORMAL LOW (ref >60)
Glucose, Bld: 63 mg/dL — ABNORMAL LOW (ref 70–99)
Potassium: 2.9 mmol/L — ABNORMAL LOW (ref 3.5–5.1)
Sodium: 145 mmol/L (ref 135–145)

## 2021-12-19 LAB — MAGNESIUM: Magnesium: 2.1 mg/dL (ref 1.7–2.4)

## 2021-12-19 MED ORDER — HYDROMORPHONE HCL 1 MG/ML IJ SOLN
1.0000 mg | INTRAMUSCULAR | Status: DC | PRN
  Administered 2021-12-19 – 2021-12-23 (×15): 1 mg via INTRAVENOUS
  Filled 2021-12-19 (×15): qty 1

## 2021-12-19 MED ORDER — SODIUM CHLORIDE 0.9 % IV SOLN
500.0000 mg | Freq: Two times a day (BID) | INTRAVENOUS | Status: DC
  Administered 2021-12-19 – 2021-12-22 (×7): 500 mg via INTRAVENOUS
  Filled 2021-12-19 (×10): qty 10

## 2021-12-19 MED ORDER — SODIUM CHLORIDE 0.9 % IV SOLN
1.0000 g | Freq: Once | INTRAVENOUS | Status: AC
  Administered 2021-12-19: 1 g via INTRAVENOUS
  Filled 2021-12-19: qty 20

## 2021-12-19 MED ORDER — DEXTROSE IN LACTATED RINGERS 5 % IV SOLN: INTRAVENOUS | Status: DC

## 2021-12-19 MED ORDER — POTASSIUM CHLORIDE 10 MEQ/100ML IV SOLN
10.0000 meq | INTRAVENOUS | Status: AC
  Administered 2021-12-19 (×4): 10 meq via INTRAVENOUS
  Filled 2021-12-19: qty 100

## 2021-12-19 NOTE — Progress Notes (Signed)
Nutrition Follow-up  DOCUMENTATION CODES:   Obesity unspecified  INTERVENTION:  - Recommend placement of NG tube and initiation of enteral nutrition for additional nutrition support if pt remains unable to take PO and as aligns with GOC; reached out to MD  - Continue to offer Ensure Enlive and ProSource as appropriate - 500 mg vitamin C BID - 220 mg zinc sulfate daily x 14 days  NUTRITION DIAGNOSIS:   Increased nutrient needs related to wound healing as evidenced by estimated needs.  Ongoing  GOAL:   Patient will meet greater than or equal to 90% of their needs  Not being met- addressing via meals and nutrition supplements  MONITOR:   PO intake, Supplement acceptance, Labs, Weight trends, Skin, I & O's  REASON FOR ASSESSMENT:   Malnutrition Screening Tool    ASSESSMENT:   Nicole [name] is a 76 y.o. female with medical history significant of HFpEF, HTN, polymyalgia rheumatica on chronic prednisone, GERN, spinal stenosis. She has been hospitalized twice in the past 3 months for AKI, abdominal pain and psoas abscess. She has been in a SNF for rehab since being discharged in middle of December. Her son gives the history - she was recently placed on antibiotics for a lower leg cellulits stemming from a chronic ulceration. Over the past several days, the patient has had very little oral intake of both food and water.  The doctor was going to place her on Remeron and she got a dose last night.  Today, she started having severe abdominal pain and had some blood work done.  The blood work showed an elevated creatinine and was transported to the hospital for evaluation.  Here, the patient was found to have bladder retention with approximately 500 mL of urine in the bladder.  She was catheterized after given pain medicine.  Pt opened eyes to RD voice then closed them. Pt appeared drowsy today. No family present to obtain history from. 0% meal completions documented.   Spoke with RN who  states pt has not taken PO as she is not alert enough to take anything PO. Recommend placement of NG tube and initiation of enteral nutrition if pt remains unable to take adequate PO and as aligns with GOC. Spoke with MD who agrees.  Medications: Vitamin C, MVI with minerals, prednisone, zinc  IV drips: D5 in LR @ 187m/hr , merrem  Labs: potassium 2.9 (replacing), BUN 118, Cr 3.74  UOP: 10530mx24 hours I/O's: +22147mince admission  NUTRITION - FOCUSED PHYSICAL EXAM:  Flowsheet Row Most Recent Value  Orbital Region No depletion  Upper Arm Region No depletion  Thoracic and Lumbar Region No depletion  Buccal Region No depletion  Temple Region No depletion  Clavicle Bone Region No depletion  Clavicle and Acromion Bone Region No depletion  Scapular Bone Region No depletion  Dorsal Hand Mild depletion  Patellar Region Mild depletion  Anterior Thigh Region Mild depletion  Posterior Calf Region No depletion  Edema (RD Assessment) Mild  [generalized]  Hair Reviewed  Eyes Unable to assess  [pt resting]  Mouth Unable to assess  [pt resting]  Skin Reviewed  Nails Reviewed       Diet Order:   Diet Order             Diet regular Room service appropriate? Yes; Fluid consistency: Thin  Diet effective now                   EDUCATION NEEDS:   No education  needs have been identified at this time  Skin:  Skin Assessment: Skin Integrity Issues: Skin Integrity Issues:: Stage II, Other (Comment) Stage II: buttocks Other: pressure ulcer to L pretibial  Last BM:  2/24  Height:   Ht Readings from Last 1 Encounters:  12/16/21 '5\' 2"'  (1.575 m)    Weight:   Wt Readings from Last 1 Encounters:  12/19/21 92.4 kg    Ideal Body Weight:  50 kg  BMI:  Body mass index is 37.26 kg/m.  Estimated Nutritional Needs:   Kcal:  1800-2000  Protein:  110-125 grams  Fluid:  > 1.8 L  Clayborne Dana, RDN, LDN Clinical Nutrition

## 2021-12-19 NOTE — Progress Notes (Signed)
Attempted again to give patient some Ensure and applesauce, patient refused and wouldn't take anything by mouth. Oral care provided. Will pass on to night shift RN.

## 2021-12-19 NOTE — Plan of Care (Signed)

## 2021-12-19 NOTE — Progress Notes (Signed)
Pt still very tearful and non-interactive with staff this morning. Unable to take PO meds. MD made aware, will continue to monitor and provide emotional and physical support as needed.

## 2021-12-19 NOTE — Progress Notes (Signed)
PROGRESS NOTE    Nicole [name]  NWG:[id number] DOB: 20-Feb-1946 DOA: 12/16/2021 PCP: Nicole Samples, PA-C   Brief Narrative:  Per HPI: Nicole [name] is a 76 y.o. female with medical history significant of HFpEF, HTN, polymyalgia rheumatica on chronic prednisone, GERN, spinal stenosis. She has been hospitalized twice in the past 3 months for AKI, abdominal pain and psoas abscess. She has been in a SNF for rehab since being discharged in middle of December. Her son gives the history - she was recently placed on antibiotics for a lower leg cellulits stemming from a chronic ulceration. Over the past several days, the patient has had very little oral intake of both food and water.  The doctor was going to place her on Remeron and she got a dose last night.  Today, she started having severe abdominal pain and had some blood work done.  The blood work showed an elevated creatinine and was transported to the hospital for evaluation.  Here, the patient was found to have bladder retention with approximately 500 mL of urine in the bladder.  She was catheterized after given pain medicine.  12/17/21: Patient was admitted with altered mentation in the setting of UTI as well as acute renal failure with acute urinary retention.  She will require Foley catheter placement today and ongoing IV fluid.  Creatinine slowly downtrending.  12/18/21: Patient continues to have altered mentation, but creatinine levels show signs of improvement in with decent urine output.  She continues to remain on treatment for her UTI.  12/19/21: Patient becomes tearful and is and is otherwise somnolent.  Creatinine levels are gradually improving.  She will require D5 IV fluid to help maintain her blood glucose levels.  Rocephin will be changed to Merrem due to noted ESBL in urine.    Assessment & Plan:   Principal Problem:   Acute renal failure (ARF) (HCC) Active Problems:   GERD (gastroesophageal reflux disease)   HTN  (hypertension)   Chronic pain syndrome   Polymyalgia rheumatica (HCC)   Morbid obesity with BMI of 50.0-59.9, adult (HCC)   Chronic diastolic HF (heart failure) ([place])   Acute urinary retention   Leg wound, left   UTI (urinary tract infection)  Assessment and Plan:  AKI-likely postrenal Secondary to dehydration and urinary retention, baseline creatinine 0.8-0.9 Creatinine slowly downtrending Patient received 1 L IV fluids Will hydrate with D5LR 100 mL per hour Recheck creatinine later tonight and tomorrow morning The patient has had previous episodes of AKI, although not quite this severe.  The patient has always responded well to IV fluids.  We will hold off on nephrology consult, for now Acute urinary retention Uncertain whether this is secondary to infection, obstruction, neurogenic bladder. Will obtain bladder scan about every 4 hours.  If it appears that the patient is retaining urine despite getting her up to the commode, will place indwelling catheter at this time with plans for urology outpatient follow-up Acute metabolic encephalopathy ongoing secondary to E Coli ESBL and Candida UTI Budding yeast and rare bacteria on UA Culture with E. coli and minimal growth with sensitivities pending Repeat CBC in the morning Continue Diflucan and change Rocephin to Merrem for improved coverage Continue to monitor neurologically for signs of improvement Left lower leg wound ABI with moderate PAD noted with no overt signs of cellulitis.  Will need vascular follow-up outpatient. Heart failure with preserved EF Patient currently dehydrated.  Will hold diuretics Chronic pain with polymyalgia rheumatica Patient on steroids, which we will  continue Hypertension GERD Morbid obesity Lifestyle modification     DVT prophylaxis: Lovenox Code Status: Full Family Communication: Discussed with son on phone 2/26 Disposition Plan:  Status is: Inpatient Remains inpatient appropriate because:  Ongoing altered mentation with need for IV medications.       Skin Assessment:   I have examined the patients skin and I agree with the wound assessment as performed by the wound care RN as outlined below:   Pressure Injury 12/16/21 Pretibial Left Ulcer (Active)  12/16/21 2100  Location: Pretibial  Location Orientation: Left  Staging:   Wound Description (Comments): Ulcer  Present on Admission: Yes     Pressure Injury 12/16/21 Buttocks Bilateral Stage 2 -  Partial thickness loss of dermis presenting as a shallow open injury with a red, pink wound bed without slough. (Active)  12/16/21 0210  Location: Buttocks  Location Orientation: Bilateral  Staging: Stage 2 -  Partial thickness loss of dermis presenting as a shallow open injury with a red, pink wound bed without slough.  Wound Description (Comments):   Present on Admission: Yes      Consultants:  None   Procedures:  See below  Antimicrobials:  Anti-infectives (From admission, onward)    Start     Dose/Rate Route Frequency Ordered Stop   12/16/21 2000  cefTRIAXone (ROCEPHIN) 1 g in sodium chloride 0.9 % 100 mL IVPB  Status:  Discontinued        1 g 200 mL/hr over 30 Minutes Intravenous Every 24 hours 12/16/21 1903 12/19/21 1046   12/16/21 2000  fluconazole (DIFLUCAN) IVPB 100 mg        100 mg 50 mL/hr over 60 Minutes Intravenous Every 48 hours 12/16/21 1919         Subjective: Patient seen and evaluated today and is quite tearful this morning, and at other times somnolent.  She continues to have good urine output with slow improvement and kidney function noted.  Objective: Vitals:   12/19/21 0810 12/19/21 0900 12/19/21 1000 12/19/21 1007  BP:  109/67 94/66   Pulse: (!) 111 (!) 107 70 74  Resp: (!) 24 14 (!) 28 18  Temp:      TempSrc:      SpO2: 100% 100% 100% 100%  Weight:      Height:        Intake/Output Summary (Last 24 hours) at 12/19/2021 1047 Last data filed at 12/19/2021 0500 Gross per 24 hour   Intake 974.99 ml  Output 1050 ml  Net -75.01 ml   Filed Weights   12/16/21 1505 12/17/21 0445 12/19/21 0500  Weight: 89.7 kg 90.7 kg 92.4 kg    Examination:  General exam: Appears tearful, obese Respiratory system: Clear to auscultation. Respiratory effort normal. Cardiovascular system: S1 & S2 heard, RRR.  Gastrointestinal system: Abdomen is soft Central nervous system: Alert and awake Extremities: Leg wounds with dressings clean dry and intact and no sign of cellulitis Skin: No significant lesions noted Psychiatry: Flat affect.    Data Reviewed: I have personally reviewed following labs and imaging studies  CBC: Recent Labs  Lab 12/16/21 1621 12/17/21 0500 12/18/21 0418 12/19/21 0652  WBC 10.8* 11.1* 9.6 9.2  NEUTROABS 9.1*  --   --   --   HGB 13.7 12.4 10.9* 11.6*  HCT 39.7 36.7 33.3* 36.9  MCV 90.4 92.7 93.5 96.1  PLT 290 240 191 622   Basic Metabolic Panel: Recent Labs  Lab 12/16/21 1621 12/16/21 2119 12/17/21 0500 12/18/21 0418 12/19/21  0652  NA 136 136 138 142 145  K 3.8 3.8 3.5 2.6* 2.9*  CL 93* 95* 100 103 106  CO2 25 23 21* 25 23  GLUCOSE 105* 97 79 70 63*  BUN 158* 149* 156* 113* 118*  CREATININE 5.98* 5.52* 5.39* 4.75* 3.74*  CALCIUM 8.9 8.5* 8.8* 8.8* 8.9  MG 2.4  --   --  2.2 2.1  PHOS 5.4*  --   --   --   --    GFR: Estimated Creatinine Clearance: 13.7 mL/min (A) (by C-G formula based on SCr of 3.74 mg/dL (H)). Liver Function Tests: No results for input(s): AST, ALT, ALKPHOS, BILITOT, PROT, ALBUMIN in the last 168 hours. No results for input(s): LIPASE, AMYLASE in the last 168 hours. No results for input(s): AMMONIA in the last 168 hours. Coagulation Profile: No results for input(s): INR, PROTIME in the last 168 hours. Cardiac Enzymes: No results for input(s): CKTOTAL, CKMB, CKMBINDEX, TROPONINI in the last 168 hours. BNP (last 3 results) No results for input(s): PROBNP in the last 8760 hours. HbA1C: No results for input(s):  HGBA1C in the last 72 hours. CBG: No results for input(s): GLUCAP in the last 168 hours. Lipid Profile: No results for input(s): CHOL, HDL, LDLCALC, TRIG, CHOLHDL, LDLDIRECT in the last 72 hours. Thyroid Function Tests: No results for input(s): TSH, T4TOTAL, FREET4, T3FREE, THYROIDAB in the last 72 hours. Anemia Panel: No results for input(s): VITAMINB12, FOLATE, FERRITIN, TIBC, IRON, RETICCTPCT in the last 72 hours. Sepsis Labs: No results for input(s): PROCALCITON, LATICACIDVEN in the last 168 hours.  Recent Results (from the past 240 hour(s))  Resp Panel by RT-PCR (Flu A&B, Covid) Nasopharyngeal Swab     Status: None   Collection Time: 12/16/21  5:23 PM   Specimen: Nasopharyngeal Swab; Nasopharyngeal(NP) swabs in vial transport medium  Result Value Ref Range Status   SARS Coronavirus 2 by RT PCR NEGATIVE NEGATIVE Final    Comment: (NOTE) SARS-CoV-2 target nucleic acids are NOT DETECTED.  The SARS-CoV-2 RNA is generally detectable in upper respiratory specimens during the acute phase of infection. The lowest concentration of SARS-CoV-2 viral copies this assay can detect is 138 copies/mL. A negative result does not preclude SARS-Cov-2 infection and should not be used as the sole basis for treatment or other patient management decisions. A negative result may occur with  improper specimen collection/handling, submission of specimen other than nasopharyngeal swab, presence of viral mutation(s) within the areas targeted by this assay, and inadequate number of viral copies(<138 copies/mL). A negative result must be combined with clinical observations, patient history, and epidemiological information. The expected result is Negative.  Fact Sheet for Patients:  EntrepreneurPulse.com.au  Fact Sheet for Healthcare Providers:  IncredibleEmployment.be  This test is no t yet approved or cleared by the [place] FDA and  has been authorized for  detection and/or diagnosis of SARS-CoV-2 by FDA under an Emergency Use Authorization (EUA). This EUA will remain  in effect (meaning this test can be used) for the duration of the COVID-19 declaration under Section 564(b)(1) of the Act, 21 U.S.C.section 360bbb-3(b)(1), unless the authorization is terminated  or revoked sooner.       Influenza A by PCR NEGATIVE NEGATIVE Final   Influenza B by PCR NEGATIVE NEGATIVE Final    Comment: (NOTE) The Xpert Xpress SARS-CoV-2/FLU/RSV plus assay is intended as an aid in the diagnosis of influenza from Nasopharyngeal swab specimens and should not be used as a sole basis for treatment. Nasal washings and  aspirates are unacceptable for Xpert Xpress SARS-CoV-2/FLU/RSV testing.  Fact Sheet for Patients: EntrepreneurPulse.com.au  Fact Sheet for Healthcare Providers: IncredibleEmployment.be  This test is not yet approved or cleared by the [place] FDA and has been authorized for detection and/or diagnosis of SARS-CoV-2 by FDA under an Emergency Use Authorization (EUA). This EUA will remain in effect (meaning this test can be used) for the duration of the COVID-19 declaration under Section 564(b)(1) of the Act, 21 U.S.C. section 360bbb-3(b)(1), unless the authorization is terminated or revoked.  Performed at [facility], [address] [place], [place] [address]   Urine Culture     Status: Abnormal   Collection Time: 12/16/21  5:46 PM   Specimen: Urine, Catheterized  Result Value Ref Range Status   Specimen Description   Final    URINE, CATHETERIZED Performed at [facility], [address] [place], [place] [address]    Special Requests   Final    NONE Performed at [facility] [address] [place], [place] [address]    Culture (A)  Final    3,000 COLONIES/mL ESCHERICHIA COLI Confirmed Extended Spectrum Beta-Lactamase Producer (ESBL).  In bloodstream infections from ESBL organisms, carbapenems  are preferred over piperacillin/tazobactam. They are shown to have a lower risk of mortality.    Report Status 12/19/2021 FINAL  Final   Organism ID, Bacteria ESCHERICHIA COLI (A)  Final      Susceptibility   Escherichia coli - MIC*    AMPICILLIN >=32 RESISTANT Resistant     CEFAZOLIN >=64 RESISTANT Resistant     CEFEPIME >=32 RESISTANT Resistant     CEFTRIAXONE >=64 RESISTANT Resistant     CIPROFLOXACIN >=4 RESISTANT Resistant     GENTAMICIN >=16 RESISTANT Resistant     IMIPENEM <=0.25 SENSITIVE Sensitive     NITROFURANTOIN 128 RESISTANT Resistant     TRIMETH/SULFA <=20 SENSITIVE Sensitive     AMPICILLIN/SULBACTAM >=32 RESISTANT Resistant     PIP/TAZO 8 SENSITIVE Sensitive     * 3,000 COLONIES/mL ESCHERICHIA COLI  MRSA Next Gen by PCR, Nasal     Status: None   Collection Time: 12/16/21  8:12 PM   Specimen: Nasal Mucosa; Nasal Swab  Result Value Ref Range Status   MRSA by PCR Next Gen NOT DETECTED NOT DETECTED Final    Comment: (NOTE) The GeneXpert MRSA Assay (FDA approved for NASAL specimens only), is one component of a comprehensive MRSA colonization surveillance program. It is not intended to diagnose MRSA infection nor to guide or monitor treatment for MRSA infections. Test performance is not FDA approved in patients less than 93 years old. Performed at [facility], [address], [place], [place] [address]          Radiology Studies: US ARTERIAL ABI (SCREENING LOWER EXTREMITY)  Result Date: 12/17/2021 CLINICAL DATA:  Lower extremity ulcers. EXAM: NONINVASIVE PHYSIOLOGIC VASCULAR STUDY OF BILATERAL LOWER EXTREMITIES TECHNIQUE: Evaluation of both lower extremities were performed at rest, including calculation of ankle-brachial indices with single level Doppler, pressure and pulse volume recording. COMPARISON:  None. FINDINGS: Right ABI:  0.8 Left ABI:  0.75 Right Lower Extremity: Monophasic dorsalis pedis waveform. Posterior tibial waveform could not be obtained. Left  Lower Extremity: Monophasic dorsalis pedis waveform. Posterior tibial waveform could not be obtained. 0.5-0.79 Moderate PAD IMPRESSION: Evidence of peripheral vascular disease in both lower extremities with resting ankle-brachial indices of 0.8 on the right and 0.75 on the left. Bilateral dorsalis pedis waveforms are monophasic. Posterior tibial waveforms could not be obtained bilaterally.  Electronically Signed   By: Aletta Edouard M.D.   On: 12/17/2021 11:09        Scheduled Meds:  (feeding supplement) PROSource Plus  30 mL Oral BID BM   vitamin C  500 mg Oral BID   Chlorhexidine Gluconate Cloth  6 each Topical Q0600   enoxaparin (LOVENOX) injection  30 mg Subcutaneous Q24H   feeding supplement  237 mL Oral TID BM   multivitamin with minerals  1 tablet Oral Daily   predniSONE  9 mg Oral Q breakfast   traZODone  25 mg Oral QHS   zinc sulfate  220 mg Oral Daily   Continuous Infusions:  dextrose 5% lactated ringers     fluconazole (DIFLUCAN) IV 100 mg (12/18/21 2004)   potassium chloride 10 mEq (12/19/21 0933)     LOS: 3 days    Time spent: 35 minutes    Dixon Luczak D Manuella Ghazi, DO [facility]  If 7PM-7AM, please contact night-coverage [email or web address] 12/19/2021, 10:47 AM

## 2021-12-19 NOTE — Progress Notes (Signed)
Pharmacy Antibiotic Note  Nicole [name] is a 76 y.o. female admitted on 12/16/2021 with UTI.  Pharmacy has been consulted for meropenem  dosing.  Plan: Meropenem 1000 mg IV x 1. Meropenem 500 mg IV every 12 hours. Monitor labs, c/s, and patient improvement.  Height: 5\' 2"  (157.5 cm) Weight: 92.4 kg (203 lb 11.3 oz) IBW/kg (Calculated) : 50.1  Temp (24hrs), Avg:98.6 F (37 C), Min:98.4 F (36.9 C), Max:98.9 F (37.2 C)  Recent Labs  Lab 12/16/21 1621 12/16/21 2119 12/17/21 0500 12/18/21 0418 12/19/21 0652  WBC 10.8*  --  11.1* 9.6 9.2  CREATININE 5.98* 5.52* 5.39* 4.75* 3.74*    Estimated Creatinine Clearance: 13.7 mL/min (A) (by C-G formula based on SCr of 3.74 mg/dL (H)).    Allergies  Allergen Reactions   Oxycontin [Oxycodone Hcl] Swelling    Pt tolerates hydromorphone.   Sulfa Antibiotics Other (See Comments)    Sores   Tramadol     "Makes me feel like I am falling"   Penicillins Rash    Tolerated augmentin    Sulfasalazine Other (See Comments)    Sores    Antimicrobials this admission: CTX 2/24 >> Fluconazole 2/24 >>    Microbiology results: 2/24 Ucx: 3000 ESBL e. Coli MRSA PCR: neg  Thank you for allowing pharmacy to be a part of this patients care.  Ramond Craver 12/19/2021 12:10 PM

## 2021-12-20 ENCOUNTER — Inpatient Hospital Stay: Payer: Self-pay

## 2021-12-20 ENCOUNTER — Inpatient Hospital Stay (HOSPITAL_COMMUNITY): Payer: Medicare Other

## 2021-12-20 DIAGNOSIS — N179 Acute kidney failure, unspecified: Secondary | ICD-10-CM | POA: Diagnosis not present

## 2021-12-20 LAB — BASIC METABOLIC PANEL
Anion gap: 13 (ref 5–15)
BUN: 89 mg/dL — ABNORMAL HIGH (ref 8–23)
CO2: 23 mmol/L (ref 22–32)
Calcium: 8.8 mg/dL — ABNORMAL LOW (ref 8.9–10.3)
Chloride: 112 mmol/L — ABNORMAL HIGH (ref 98–111)
Creatinine, Ser: 2.63 mg/dL — ABNORMAL HIGH (ref 0.44–1.00)
GFR, Estimated: 18 mL/min — ABNORMAL LOW (ref >60)
Glucose, Bld: 110 mg/dL — ABNORMAL HIGH (ref 70–99)
Potassium: 2.5 mmol/L — CL (ref 3.5–5.1)
Sodium: 148 mmol/L — ABNORMAL HIGH (ref 135–145)

## 2021-12-20 LAB — AMMONIA: Ammonia: 18 umol/L (ref 9–35)

## 2021-12-20 LAB — MAGNESIUM: Magnesium: 2.2 mg/dL (ref 1.7–2.4)

## 2021-12-20 LAB — TSH: TSH: 2.433 u[IU]/mL (ref 0.350–4.500)

## 2021-12-20 MED ORDER — POTASSIUM CHLORIDE 10 MEQ/100ML IV SOLN
10.0000 meq | INTRAVENOUS | Status: AC
  Administered 2021-12-20 (×4): 10 meq via INTRAVENOUS
  Filled 2021-12-20 (×4): qty 100

## 2021-12-20 MED ORDER — SODIUM CHLORIDE 0.9% FLUSH: 10.0000 mL | INTRAVENOUS | Status: DC | PRN

## 2021-12-20 MED ORDER — SODIUM CHLORIDE 0.9% FLUSH
10.0000 mL | Freq: Two times a day (BID) | INTRAVENOUS | Status: DC
  Administered 2021-12-20 – 2021-12-23 (×4): 10 mL

## 2021-12-20 MED ORDER — HYDROMORPHONE HCL 1 MG/ML IJ SOLN
0.5000 mg | Freq: Once | INTRAMUSCULAR | Status: AC
  Administered 2021-12-20: 0.5 mg via INTRAMUSCULAR
  Filled 2021-12-20: qty 0.5

## 2021-12-20 NOTE — Progress Notes (Addendum)
Nutrition Follow-up  DOCUMENTATION CODES:   Obesity unspecified  INTERVENTION:   - Once Cortrak/NG tube is placed: Recommend starting Osmolite 1.5 at 20 ml/h and advance 10 ml every 8 hours until goal rate of 50 ml is reached (1200 ml per day)  - Prosource TF 45 ml BID  Provides 1800 kcal, 97 gm protein, 984 ml free water daily  - Free water flushes of 176mL Q4H  - Monitor magnesium, potassium, and phosphorus BID for at least 3 days, MD to replete as needed, as pt is at risk for refeeding syndrome given >3 days inadequate PO intake.  NUTRITION DIAGNOSIS:   Inadequate oral intake related to inability to eat as evidenced by NPO status.  GOAL:   Patient will meet greater than or equal to 90% of their needs  Addressing needs with initiation of TF  MONITOR:   PO intake, Supplement acceptance, Labs, Weight trends, Skin, I & O's  REASON FOR ASSESSMENT:   Malnutrition Screening Tool    ASSESSMENT:   Nicole [name] is a 76 y.o. female with medical history significant of HFpEF, HTN, polymyalgia rheumatica on chronic prednisone, GERN, spinal stenosis. She has been hospitalized twice in the past 3 months for AKI, abdominal pain and psoas abscess. She has been in a SNF for rehab since being discharged in middle of December. Her son gives the history - she was recently placed on antibiotics for a lower leg cellulits stemming from a chronic ulceration. Over the past several days, the patient has had very little oral intake of both food and water.  The doctor was going to place her on Remeron and she got a dose last night.  Today, she started having severe abdominal pain and had some blood work done.  The blood work showed an elevated creatinine and was transported to the hospital for evaluation.  Here, the patient was found to have bladder retention with approximately 500 mL of urine in the bladder.  She was catheterized after given pain medicine.  Per review of chart, pt remains altered and  not taking anything PO. RN attempted bedside NG tube placement unsuccessfully d/t agitation, coughing and gagging.   IV line infiltrated, multiple failed attempts to replace. PICC line placed for medication administration. TPN not desirable for nutrition support given she is high risk for volume overload as pt has h/o CHF, acute renal failure and recurrent AKI.   Spoke with MD and RN about possibility of transport to [facility] for Cortrak placement, if unable to be placed with fluoroscopy assistance at [facility]. Noted RN plans to follow up with IR tomorrow for assistance with scheduling transport to [facility] for Cortrak placement.  Current weight: 94.5 kg + 2.1 kg since 2/27  Medications reviewed  Labs: sodium 148, potassium 2.5, BUN 89, Cr 2.63, Calcium 8.8  UOP: 1225 mL x24 hours I/O's: +3552 ml since admission  Diet Order:   Diet Order     None       EDUCATION NEEDS:   No education needs have been identified at this time  Skin:  Skin Assessment: Skin Integrity Issues: Skin Integrity Issues:: Stage II, Other (Comment) Stage II: buttocks Other: pressure ulcer to L pretibial  Last BM:  2/27 (type 6)  Height:   Ht Readings from Last 1 Encounters:  12/16/21 5\' 2"  (1.575 m)    Weight:   Wt Readings from Last 1 Encounters:  12/20/21 94.5 kg    Ideal Body Weight:  50 kg  BMI:  Body mass  index is 38.1 kg/m.  Estimated Nutritional Needs:   Kcal:  1800-2000  Protein:  90-105g  Fluid:  > 1.8 L  Clayborne Dana, RDN, LDN Clinical Nutrition

## 2021-12-20 NOTE — Progress Notes (Signed)
PICC line placement is a medical necessity due to limited venous access with multiple failed attempts of peripheral IV placement.  She will also require this for parenteral nutrition.

## 2021-12-20 NOTE — Progress Notes (Addendum)
Patient unable to take morning medications or breakfast. Unable to follow commands. When aroused, patient starts to cry. Dr. Manuella Ghazi to speak with son about placement of NG tube for artificial nutrition and a way to give medications.

## 2021-12-20 NOTE — TOC Progression Note (Signed)
Transition of Care [facility]) - Progression Note    Patient Details  Name: Nicole [name] MRN: [id number] Date of Birth: 04-Feb-1946  Transition of Care [facility]) CM/SW Contact  Boneta Lucks, RN Phone Number: 12/20/2021, 10:50 AM  Clinical Narrative:   TOC following, patient is critical from [facility].   Expected Discharge Plan: Skilled Nursing Facility Barriers to Discharge: Continued Medical Work up  Expected Discharge Plan and Services Expected Discharge Plan: [place]

## 2021-12-20 NOTE — Progress Notes (Signed)
Per dietician, a NG tube is preferred for patient to have nutrition. Dr. Manuella Ghazi aware. This RN has called multiple departments about next steps for NG tube placement since this RN and other RN had failed attempts at placing NG tube. Cortrak tube order placed, cortrak team is only at [facility] on Monday, Wednesdays and Fridays. Radiology department here at [facility] made aware also in case they could help with NG tube placement under fluoroscopy. Dr. Thornton Papas aware. Since unable to get in touch with cortrak team until tomorrow, NG tube placement is on hold. Will follow up with this tomorrow 3/1.

## 2021-12-20 NOTE — Progress Notes (Signed)
PROGRESS NOTE    Nicole [name]  ZHG:[id number] DOB: [birth year] DOA: 12/16/2021 PCP: Jake Samples, PA-C   Brief Narrative:  Per HPI: Nicole [name] is a 76 y.o. female with medical history significant of HFpEF, HTN, polymyalgia rheumatica on chronic prednisone, GERN, spinal stenosis. She has been hospitalized twice in the past 3 months for AKI, abdominal pain and psoas abscess. She has been in a SNF for rehab since being discharged in middle of December. Her son gives the history - she was recently placed on antibiotics for a lower leg cellulits stemming from a chronic ulceration. Over the past several days, the patient has had very little oral intake of both food and water.  The doctor was going to place her on Remeron and she got a dose last night.  Today, she started having severe abdominal pain and had some blood work done.  The blood work showed an elevated creatinine and was transported to the hospital for evaluation.  Here, the patient was found to have bladder retention with approximately 500 mL of urine in the bladder.  She was catheterized after given pain medicine.  12/17/21: Patient was admitted with altered mentation in the setting of UTI as well as acute renal failure with acute urinary retention.  She will require Foley catheter placement today and ongoing IV fluid.  Creatinine slowly downtrending.  12/18/21: Patient continues to have altered mentation, but creatinine levels show signs of improvement in with decent urine output.  She continues to remain on treatment for her UTI.  12/19/21: Patient becomes tearful and is and is otherwise somnolent.  Creatinine levels are gradually improving.  She will require D5 IV fluid to help maintain her blood glucose levels.  Rocephin will be changed to Merrem due to noted ESBL in urine.  12/20/21: Patient continues to remain quite altered, but creatinine levels are gradually improving.  NG tube to be inserted with tube feedings to initiate today.   Continue ongoing treatment with antibiotics for now as ordered and may consider discontinuation in the next 1-2 days.    Assessment & Plan:   Principal Problem:   Acute renal failure (ARF) (HCC) Active Problems:   GERD (gastroesophageal reflux disease)   HTN (hypertension)   Chronic pain syndrome   Polymyalgia rheumatica (HCC)   Morbid obesity with BMI of 50.0-59.9, adult (HCC)   Chronic diastolic HF (heart failure) ([place])   Acute urinary retention   Leg wound, left   UTI (urinary tract infection)  Assessment and Plan:   AKI-likely postrenal; improving Secondary to dehydration and urinary retention, baseline creatinine 0.8-0.9 Creatinine slowly downtrending Will continue to hydrate with D5LR 100 mL per hour Recheck creatinine later tonight and tomorrow morning The patient has had previous episodes of AKI, although not quite this severe.  The patient has always responded well to IV fluids.  We will hold off on nephrology consult, for now Acute urinary retention Uncertain whether this is secondary to infection, obstruction, neurogenic bladder. Will obtain bladder scan about every 4 hours.  If it appears that the patient is retaining urine despite getting her up to the commode, continue indwelling catheter at this time with plans for urology outpatient follow-up Acute metabolic encephalopathy ongoing secondary to AKI and E Coli ESBL and Candida UTI Budding yeast and rare bacteria on UA Culture with E. coli and minimal growth with sensitivities pending Repeat CBC in the morning Continue Diflucan and change Rocephin to Merrem for improved coverage Continue to monitor neurologically for signs of improvement  Placement of NG tube with tube feedings today due to ongoing encephalopathic state Check TSH and ammonia levels today along with CT head Left lower leg wound ABI with moderate PAD noted with no overt signs of cellulitis.  Will need vascular follow-up outpatient. Heart failure with  preserved EF Patient currently dehydrated.  Will hold diuretics Chronic pain with polymyalgia rheumatica Patient on steroids, which we will continue Hypertension GERD Morbid obesity Lifestyle modification Hypokalemia Replete and reevaluate in a.m.     DVT prophylaxis: Lovenox Code Status: Full Family Communication: Discussed with son on phone 2/28 Disposition Plan:  Status is: Inpatient Remains inpatient appropriate because: Ongoing altered mentation with need for IV medications.       Skin Assessment:   I have examined the patients skin and I agree with the wound assessment as performed by the wound care RN as outlined below:   Pressure Injury 12/16/21 Pretibial Left Ulcer (Active)  12/16/21 2100  Location: Pretibial  Location Orientation: Left  Staging:   Wound Description (Comments): Ulcer  Present on Admission: Yes     Pressure Injury 12/16/21 Buttocks Bilateral Stage 2 -  Partial thickness loss of dermis presenting as a shallow open injury with a red, pink wound bed without slough. (Active)  12/16/21 0210  Location: Buttocks  Location Orientation: Bilateral  Staging: Stage 2 -  Partial thickness loss of dermis presenting as a shallow open injury with a red, pink wound bed without slough.  Wound Description (Comments):   Present on Admission: Yes      Consultants:  None   Procedures:  See below  Antimicrobials:  Anti-infectives (From admission, onward)    Start     Dose/Rate Route Frequency Ordered Stop   12/20/21 0000  meropenem (MERREM) 500 mg in sodium chloride 0.9 % 100 mL IVPB        500 mg 200 mL/hr over 30 Minutes Intravenous Every 12 hours 12/19/21 1210     12/19/21 1215  meropenem (MERREM) 1 g in sodium chloride 0.9 % 100 mL IVPB        1 g 200 mL/hr over 30 Minutes Intravenous  Once 12/19/21 1115 12/19/21 1212   12/16/21 2000  cefTRIAXone (ROCEPHIN) 1 g in sodium chloride 0.9 % 100 mL IVPB  Status:  Discontinued        1 g 200 mL/hr over 30  Minutes Intravenous Every 24 hours 12/16/21 1903 12/19/21 1046   12/16/21 2000  fluconazole (DIFLUCAN) IVPB 100 mg        100 mg 50 mL/hr over 60 Minutes Intravenous Every 48 hours 12/16/21 1919         Subjective: Patient seen and evaluated today and still continues to remain somnolent with altered mentation today.  Son is agreeable to placement of NG tube with tube feedings today.  Objective: Vitals:   12/20/21 0700 12/20/21 0800 12/20/21 0900 12/20/21 1000  BP: 108/80 114/72 98/64 (!) 145/89  Pulse:  98  62  Resp: 10 (!) 9 11 (!) 21  Temp:  98.4 F (36.9 C)    TempSrc:  Axillary    SpO2: 100% 100%  91%  Weight:      Height:        Intake/Output Summary (Last 24 hours) at 12/20/2021 1026 Last data filed at 12/20/2021 0840 Gross per 24 hour  Intake 2451.23 ml  Output 1225 ml  Net 1226.23 ml   Filed Weights   12/17/21 0445 12/19/21 0500 12/20/21 0410  Weight: 90.7 kg 92.4 kg  94.5 kg    Examination:  General exam: Appears lethargic, but arousable, obese Respiratory system: Clear to auscultation. Respiratory effort normal.  On nasal cannula oxygen Cardiovascular system: S1 & S2 heard, RRR.  Gastrointestinal system: Abdomen is soft Central nervous system: Lethargic but arousable Extremities: No edema, wounds clean dry and intact Skin: No significant lesions noted Psychiatry: Flat affect. Foley with clear, yellow urine output noted    Data Reviewed: I have personally reviewed following labs and imaging studies  CBC: Recent Labs  Lab 12/16/21 1621 12/17/21 0500 12/18/21 0418 12/19/21 0652  WBC 10.8* 11.1* 9.6 9.2  NEUTROABS 9.1*  --   --   --   HGB 13.7 12.4 10.9* 11.6*  HCT 39.7 36.7 33.3* 36.9  MCV 90.4 92.7 93.5 96.1  PLT 290 240 191 176   Basic Metabolic Panel: Recent Labs  Lab 12/16/21 1621 12/16/21 2119 12/17/21 0500 12/18/21 0418 12/19/21 0652 12/20/21 0427  NA 136 136 138 142 145 148*  K 3.8 3.8 3.5 2.6* 2.9* 2.5*  CL 93* 95* 100 103 106  112*  CO2 25 23 21* 25 23 23   GLUCOSE 105* 97 79 70 63* 110*  BUN 158* 149* 156* 113* 118* 89*  CREATININE 5.98* 5.52* 5.39* 4.75* 3.74* 2.63*  CALCIUM 8.9 8.5* 8.8* 8.8* 8.9 8.8*  MG 2.4  --   --  2.2 2.1 2.2  PHOS 5.4*  --   --   --   --   --    GFR: Estimated Creatinine Clearance: 19.8 mL/min (A) (by C-G formula based on SCr of 2.63 mg/dL (H)). Liver Function Tests: No results for input(s): AST, ALT, ALKPHOS, BILITOT, PROT, ALBUMIN in the last 168 hours. No results for input(s): LIPASE, AMYLASE in the last 168 hours. No results for input(s): AMMONIA in the last 168 hours. Coagulation Profile: No results for input(s): INR, PROTIME in the last 168 hours. Cardiac Enzymes: No results for input(s): CKTOTAL, CKMB, CKMBINDEX, TROPONINI in the last 168 hours. BNP (last 3 results) No results for input(s): PROBNP in the last 8760 hours. HbA1C: No results for input(s): HGBA1C in the last 72 hours. CBG: No results for input(s): GLUCAP in the last 168 hours. Lipid Profile: No results for input(s): CHOL, HDL, LDLCALC, TRIG, CHOLHDL, LDLDIRECT in the last 72 hours. Thyroid Function Tests: No results for input(s): TSH, T4TOTAL, FREET4, T3FREE, THYROIDAB in the last 72 hours. Anemia Panel: No results for input(s): VITAMINB12, FOLATE, FERRITIN, TIBC, IRON, RETICCTPCT in the last 72 hours. Sepsis Labs: No results for input(s): PROCALCITON, LATICACIDVEN in the last 168 hours.  Recent Results (from the past 240 hour(s))  Resp Panel by RT-PCR (Flu A&B, Covid) Nasopharyngeal Swab     Status: None   Collection Time: 12/16/21  5:23 PM   Specimen: Nasopharyngeal Swab; Nasopharyngeal(NP) swabs in vial transport medium  Result Value Ref Range Status   SARS Coronavirus 2 by RT PCR NEGATIVE NEGATIVE Final    Comment: (NOTE) SARS-CoV-2 target nucleic acids are NOT DETECTED.  The SARS-CoV-2 RNA is generally detectable in upper respiratory specimens during the acute phase of infection. The  lowest concentration of SARS-CoV-2 viral copies this assay can detect is 138 copies/mL. A negative result does not preclude SARS-Cov-2 infection and should not be used as the sole basis for treatment or other patient management decisions. A negative result may occur with  improper specimen collection/handling, submission of specimen other than nasopharyngeal swab, presence of viral mutation(s) within the areas targeted by this assay, and inadequate number  of viral copies(<138 copies/mL). A negative result must be combined with clinical observations, patient history, and epidemiological information. The expected result is Negative.  Fact Sheet for Patients:  EntrepreneurPulse.com.au  Fact Sheet for Healthcare Providers:  IncredibleEmployment.be  This test is no t yet approved or cleared by the [place] FDA and  has been authorized for detection and/or diagnosis of SARS-CoV-2 by FDA under an Emergency Use Authorization (EUA). This EUA will remain  in effect (meaning this test can be used) for the duration of the COVID-19 declaration under Section 564(b)(1) of the Act, 21 U.S.C.section 360bbb-3(b)(1), unless the authorization is terminated  or revoked sooner.       Influenza A by PCR NEGATIVE NEGATIVE Final   Influenza B by PCR NEGATIVE NEGATIVE Final    Comment: (NOTE) The Xpert Xpress SARS-CoV-2/FLU/RSV plus assay is intended as an aid in the diagnosis of influenza from Nasopharyngeal swab specimens and should not be used as a sole basis for treatment. Nasal washings and aspirates are unacceptable for Xpert Xpress SARS-CoV-2/FLU/RSV testing.  Fact Sheet for Patients: EntrepreneurPulse.com.au  Fact Sheet for Healthcare Providers: IncredibleEmployment.be  This test is not yet approved or cleared by the [place] FDA and has been authorized for detection and/or diagnosis of SARS-CoV-2 by FDA under  an Emergency Use Authorization (EUA). This EUA will remain in effect (meaning this test can be used) for the duration of the COVID-19 declaration under Section 564(b)(1) of the Act, 21 U.S.C. section 360bbb-3(b)(1), unless the authorization is terminated or revoked.  Performed at [facility], [address] [place], [place] [address]   Urine Culture     Status: Abnormal   Collection Time: 12/16/21  5:46 PM   Specimen: Urine, Catheterized  Result Value Ref Range Status   Specimen Description   Final    URINE, CATHETERIZED Performed at [facility], [address] [place], [place] [address]    Special Requests   Final    NONE Performed at [facility], [address], [place], [place] [address]    Culture (A)  Final    3,000 COLONIES/mL ESCHERICHIA COLI Confirmed Extended Spectrum Beta-Lactamase Producer (ESBL).  In bloodstream infections from ESBL organisms, carbapenems are preferred over piperacillin/tazobactam. They are shown to have a lower risk of mortality.    Report Status 12/19/2021 FINAL  Final   Organism ID, Bacteria ESCHERICHIA COLI (A)  Final      Susceptibility   Escherichia coli - MIC*    AMPICILLIN >=32 RESISTANT Resistant     CEFAZOLIN >=64 RESISTANT Resistant     CEFEPIME >=32 RESISTANT Resistant     CEFTRIAXONE >=64 RESISTANT Resistant     CIPROFLOXACIN >=4 RESISTANT Resistant     GENTAMICIN >=16 RESISTANT Resistant     IMIPENEM <=0.25 SENSITIVE Sensitive     NITROFURANTOIN 128 RESISTANT Resistant     TRIMETH/SULFA <=20 SENSITIVE Sensitive     AMPICILLIN/SULBACTAM >=32 RESISTANT Resistant     PIP/TAZO 8 SENSITIVE Sensitive     * 3,000 COLONIES/mL ESCHERICHIA COLI  MRSA Next Gen by PCR, Nasal     Status: None   Collection Time: 12/16/21  8:12 PM   Specimen: Nasal Mucosa; Nasal Swab  Result Value Ref Range Status   MRSA by PCR Next Gen NOT DETECTED NOT DETECTED Final    Comment: (NOTE) The GeneXpert MRSA Assay (FDA approved for NASAL specimens only), is  one component of a comprehensive MRSA colonization surveillance program. It is not intended to diagnose MRSA infection nor to guide  or monitor treatment for MRSA infections. Test performance is not FDA approved in patients less than 34 years old. Performed at [facility], [address], [place], [place] [address]          Radiology Studies: No results found.      Scheduled Meds:  (feeding supplement) PROSource Plus  30 mL Oral BID BM   vitamin C  500 mg Oral BID   Chlorhexidine Gluconate Cloth  6 each Topical Q0600   enoxaparin (LOVENOX) injection  30 mg Subcutaneous Q24H   feeding supplement  237 mL Oral TID BM   multivitamin with minerals  1 tablet Oral Daily   predniSONE  9 mg Oral Q breakfast   traZODone  25 mg Oral QHS   zinc sulfate  220 mg Oral Daily   Continuous Infusions:  dextrose 5% lactated ringers 100 mL/hr at 12/19/21 2208   fluconazole (DIFLUCAN) IV 100 mg (12/18/21 2004)   meropenem (MERREM) IV Stopped (12/20/21 1016)   potassium chloride 10 mEq (12/20/21 0907)     LOS: 4 days    Time spent: 35 minutes    Athea Haley Darleen Crocker, DO [facility]  If 7PM-7AM, please contact night-coverage [email or web address] 12/20/2021, 10:26 AM

## 2021-12-20 NOTE — Plan of Care (Signed)

## 2021-12-20 NOTE — Progress Notes (Signed)
Peripherally Inserted Central Catheter Placement  The IV Nurse has discussed with the patient and/or persons authorized to consent for the patient, the purpose of this procedure and the potential benefits and risks involved with this procedure.  The benefits include less needle sticks, lab draws from the catheter, and the patient may be discharged home with the catheter. Risks include, but not limited to, infection, bleeding, blood clot (thrombus formation), and puncture of an artery; nerve damage and irregular heartbeat and possibility to perform a PICC exchange if needed/ordered by physician.  Alternatives to this procedure were also discussed.  Bard Power PICC patient education guide, fact sheet on infection prevention and patient information card has been provided to patient /or left at bedside.    PICC Placement Documentation  PICC Double Lumen 12/20/21 Left Brachial 46 cm 1 cm (Active)  Indication for Insertion or Continuance of Line Poor Vasculature-patient has had multiple peripheral attempts or PIVs lasting less than 24 hours 12/20/21 1600  Exposed Catheter (cm) 1 cm 12/20/21 1600  Site Assessment Clean, Dry, Intact 12/20/21 1600  Lumen #1 Status Flushed;Saline locked;Blood return noted 12/20/21 1600  Lumen #2 Status Flushed;Saline locked;Blood return noted 12/20/21 1600  Dressing Type Securing device;Transparent 12/20/21 1600  Dressing Status Antimicrobial disc in place 12/20/21 1600  Safety Lock Not Applicable 83/41/96 2229  Line Care Connections checked and tightened 12/20/21 1600  Dressing Intervention New dressing 12/20/21 1600  Dressing Change Due 12/27/21 12/20/21 1600       Darlyn Read 12/20/2021, 4:48 PM

## 2021-12-20 NOTE — Progress Notes (Signed)
Patient's one peripheral IV noted to be red/swollen/tender. Seems to have infiltrated. Medications stopped. Multiple attempts to get new IV access, all unsuccessful. Dr. Manuella Ghazi made aware. PICC line ordered. Awaiting to hear [facility] at [facility].

## 2021-12-20 NOTE — Progress Notes (Signed)
Multiple attempts to place NG tube. Unsuccessful due to patient's agitation, gagging/coughing, and being unable to swallow. Dr. Manuella Ghazi notified.

## 2021-12-21 ENCOUNTER — Inpatient Hospital Stay (HOSPITAL_COMMUNITY): Payer: Medicare Other

## 2021-12-21 DIAGNOSIS — N179 Acute kidney failure, unspecified: Secondary | ICD-10-CM | POA: Diagnosis not present

## 2021-12-21 DIAGNOSIS — R1312 Dysphagia, oropharyngeal phase: Secondary | ICD-10-CM

## 2021-12-21 DIAGNOSIS — K219 Gastro-esophageal reflux disease without esophagitis: Secondary | ICD-10-CM | POA: Diagnosis not present

## 2021-12-21 DIAGNOSIS — L8990 Pressure ulcer of unspecified site, unspecified stage: Secondary | ICD-10-CM | POA: Insufficient documentation

## 2021-12-21 DIAGNOSIS — R338 Other retention of urine: Secondary | ICD-10-CM | POA: Diagnosis not present

## 2021-12-21 LAB — BASIC METABOLIC PANEL
Anion gap: 10 (ref 5–15)
BUN: 74 mg/dL — ABNORMAL HIGH (ref 8–23)
CO2: 25 mmol/L (ref 22–32)
Calcium: 8.8 mg/dL — ABNORMAL LOW (ref 8.9–10.3)
Chloride: 115 mmol/L — ABNORMAL HIGH (ref 98–111)
Creatinine, Ser: 2.26 mg/dL — ABNORMAL HIGH (ref 0.44–1.00)
GFR, Estimated: 22 mL/min — ABNORMAL LOW (ref >60)
Glucose, Bld: 135 mg/dL — ABNORMAL HIGH (ref 70–99)
Potassium: 2.7 mmol/L — CL (ref 3.5–5.1)
Sodium: 150 mmol/L — ABNORMAL HIGH (ref 135–145)

## 2021-12-21 LAB — CBC
HCT: 33 % — ABNORMAL LOW (ref 36.0–46.0)
Hemoglobin: 10.7 g/dL — ABNORMAL LOW (ref 12.0–15.0)
MCH: 31 pg (ref 26.0–34.0)
MCHC: 32.4 g/dL (ref 30.0–36.0)
MCV: 95.7 fL (ref 80.0–100.0)
Platelets: 202 10*3/uL (ref 150–400)
RBC: 3.45 MIL/uL — ABNORMAL LOW (ref 3.87–5.11)
RDW: 15.5 % (ref 11.5–15.5)
WBC: 6.9 10*3/uL (ref 4.0–10.5)
nRBC: 0 % (ref 0.0–0.2)

## 2021-12-21 LAB — MAGNESIUM: Magnesium: 2.1 mg/dL (ref 1.7–2.4)

## 2021-12-21 MED ORDER — OSMOLITE 1.5 CAL PO LIQD
1000.0000 mL | ORAL | Status: DC
  Administered 2021-12-21 – 2021-12-22 (×2): 1000 mL

## 2021-12-21 MED ORDER — PROSOURCE TF PO LIQD
45.0000 mL | Freq: Two times a day (BID) | ORAL | Status: DC
  Administered 2021-12-21 – 2021-12-23 (×5): 45 mL
  Filled 2021-12-21 (×5): qty 45

## 2021-12-21 MED ORDER — POTASSIUM CHLORIDE 10 MEQ/100ML IV SOLN
10.0000 meq | INTRAVENOUS | Status: AC
  Administered 2021-12-21 (×4): 10 meq via INTRAVENOUS
  Filled 2021-12-21 (×4): qty 100

## 2021-12-21 NOTE — TOC Progression Note (Signed)
Transition of Care (TOC) - Progression Note  ? ? ?Patient Details  ?Name: Nicole [name] ?MRN: [id number] ?Date of Birth: 22-Apr-1946 ? ?Transition of Care (TOC) CM/SW Contact  ?Boneta Lucks, RN ?Phone Number: ?12/21/2021, 1:51 PM ? ?Clinical Narrative:   [facility] reviewing patient chart, she would be a candidate to transfer. TOC and Anderson Malta with [facility] spoke with patient sonClifton [name]. She will talk with family and make a decision tonight.  ? ?Patient from [facility], per Nicole [name] she was past her 100 days with Medicare, he was private paying. [facility] gave up the room as patient looked to need hospital care for a while.  ?Son is requesting [facility]. TOC had Kerri review the chart. Patient is to critical to make a discharge plan with SNF at this time. [place] will look at when medically stable for discharge. Will need a new FL2. TOC to follow.  ? ?Updated the son.  ? ?Expected Discharge Plan: [place] ?Barriers to Discharge: Continued Medical Work up ? ?Expected Discharge Plan and Services ?Expected Discharge Plan: [place] ?  ?  ?  ?  ?                ?  ?  ?  ?  ?  ?  ?  ?  ?  ?  ? ? ?Social Determinants of Health (SDOH) Interventions ?  ? ?Readmission Risk Interventions ?Readmission Risk Prevention Plan 10/04/2021 09/26/2021  ?Transportation Screening Complete Complete  ?[place] or Home Care Consult Complete Complete  ?Social Work Consult for [place] Planning/Counseling Complete Complete  ?Palliative Care Screening Not Applicable Not Applicable  ?Medication Review Press photographer) Complete Complete  ?Some recent data might be hidden  ? ? ?

## 2021-12-21 NOTE — Progress Notes (Addendum)
Brief Nutrition Note ? ?Consult received for enteral/tube feeding initiation. ? ?-Osmolite 1.5 at 20 ml/h per NGT and advance 10 ml every 8 hours until goal rate of 50 ml is reached (1200 ml per day) ?  ?- Prosource TF 45 ml BID ?  ?Provides 1800 kcal, 97 gm protein, 984 ml free water daily ?  ?-If no IVF- Add Free water flushes of 143mL Q4H  ?  ?- Monitor magnesium, potassium, and phosphorus BID for at least 3 days, MD to replete as needed, as pt is at risk for refeeding syndrome given >3 days inadequate PO intake. ? ? ?Admitting Dx: Acute renal failure (ARF) ([place]) [N17.9] ?Leg wound, left [S81.802A] ?AKI (acute kidney injury) ([place]) [N17.9] ?Abdominal pain, unspecified abdominal location [R10.9] ? ?Body mass index is 38.27 kg/m?[name] Pt meets criteria for obese based on current BMI. ? ? ?Intake/Output Summary (Last 24 hours) at 12/21/2021 1406 ?Last data filed at 12/21/2021 9562 ?Gross per 24 hour  ?Intake 1543.37 ml  ?Output 651 ml  ?Net 892.37 ml  ?Patient  +4.8 liters since admission ? ?IVF-D5% Lactated Ringer @ 100 ml/hr ? ?Labs: ?Recent Labs  ?Lab 12/16/21 ?1621 12/16/21 ?2119 12/19/21 ?1308 12/20/21 ?6578 12/21/21 ?4696  ?NA 136   < > 145 148* 150*  ?K 3.8   < > 2.9* 2.5* 2.7*  ?CL 93*   < > 106 112* 115*  ?CO2 25   < > 23 23 25   ?BUN 158*   < > 118* 89* 74*  ?CREATININE 5.98*   < > 3.74* 2.63* 2.26*  ?CALCIUM 8.9   < > 8.9 8.8* 8.8*  ?MG 2.4   < > 2.1 2.2 2.1  ?PHOS 5.4*  --   --   --   --   ?GLUCOSE 105*   < > 63* 110* 135*  ? < > = values in this interval not displayed.  ? ? ?Colman Cater MS,RD,CSG,LDN ?Contact: AMION  ?

## 2021-12-21 NOTE — Progress Notes (Signed)
Patient's son requesting [facility] at discharge. Social work made aware.  ?Patient's son also made aware of the inability to place NG tube yesterday. But radiology will be trying today. He was also made aware if that was unsuccessful, she'd have to potentially go to [facility] for placemen of a NG tube. ?

## 2021-12-21 NOTE — Progress Notes (Signed)
PROGRESS NOTE    Nicole [name]  FGH:[id number] DOB: 1946/07/02 DOA: 12/16/2021 PCP: Jake Samples, PA-C   Brief Narrative:  Per HPI: Nicole [name] is a 76 y.o. female with medical history significant of HFpEF, HTN, polymyalgia rheumatica on chronic prednisone, GERN, spinal stenosis. She has been hospitalized twice in the past 3 months for AKI, abdominal pain and psoas abscess. She has been in a SNF for rehab since being discharged in middle of December. Her son gives the history - she was recently placed on antibiotics for a lower leg cellulits stemming from a chronic ulceration. Over the past several days, the patient has had very little oral intake of both food and water.  The doctor was going to place her on Remeron and she got a dose last night.  Today, she started having severe abdominal pain and had some blood work done.  The blood work showed an elevated creatinine and was transported to the hospital for evaluation.  Here, the patient was found to have bladder retention with approximately 500 mL of urine in the bladder.  She was catheterized after given pain medicine.  12/17/21: Patient was admitted with altered mentation in the setting of UTI as well as acute renal failure with acute urinary retention.  She will require Foley catheter placement today and ongoing IV fluid.  Creatinine slowly downtrending.  12/18/21: Patient continues to have altered mentation, but creatinine levels show signs of improvement in with decent urine output.  She continues to remain on treatment for her UTI.  12/19/21: Patient becomes tearful and is and is otherwise somnolent.  Creatinine levels are gradually improving.  She will require D5 IV fluid to help maintain her blood glucose levels.  Rocephin will be changed to Merrem due to noted ESBL in urine.  12/20/21: Patient continues to remain quite altered, but creatinine levels are gradually improving.  NG tube to be inserted with tube feedings to initiate today.   Continue ongoing treatment with antibiotics for now as ordered and may consider discontinuation in the next 1-2 days.   12/21/21: Patient remains altered, disoriented and intermittently experiencing lethargy and crying spells.  Unsafe to take oral intakes.  Status post NG tube placement under fluoroscopy and initiation of tube feedings.  Continue antibiotics and 1 more day of Diflucan.  Continue IV fluids and follow renal function trend.  Prognosis is guarded.  Assessment & Plan:   Principal Problem:   Acute renal failure (ARF) (HCC) Active Problems:   GERD (gastroesophageal reflux disease)   HTN (hypertension)   Chronic pain syndrome   Polymyalgia rheumatica (HCC)   Morbid obesity with BMI of 50.0-59.9, adult (HCC)   Chronic diastolic HF (heart failure) ([place])   Acute urinary retention   Leg wound, left   UTI (urinary tract infection)   Pressure injury of skin  Assessment and Plan:   AKI-likely postrenal given urinary retention. Secondary to dehydration and urinary retention, baseline creatinine 0.8-0.9 Creatinine continues slowly improving. Will continue to hydrate with D5LR 100 mL per hour Recheck creatinine later tonight and tomorrow morning The patient has had previous episodes of AKI, although not quite this severe.  The patient has always responded well to IV fluids.  Continue to follow renal function trend. Acute urinary retention Uncertain whether this is secondary to infection, obstruction, neurogenic bladder. Will obtain bladder scan about every 4 hours.  If it appears that the patient is retaining urine despite getting her up to the commode, continue indwelling catheter at this time with  plans for urology outpatient follow-up. Acute metabolic encephalopathy ongoing secondary to AKI and E Coli ESBL and Candida UTI Budding yeast and rare bacteria on UA Culture with E. coli and minimal growth with sensitivities pending Repeat CBC in the morning Continue Diflucan and change  Rocephin to Merrem for improved coverage Continue to monitor neurologically for signs of improvement Placement of NG tube under fluoroscopy, with tube feedings initiation today (12/21/2021) due to ongoing encephalopathic state TSH, ammonia level and CT head within normal limits. Left lower leg wound ABI with moderate PAD noted with no overt signs of cellulitis.  Will need vascular follow-up outpatient. Heart failure with preserved EF Patient currently dehydrated.  Will hold diuretics Chronic pain with polymyalgia rheumatica Patient on steroids, which we will continue Hypertension GERD Morbid obesity Lifestyle modification Hypokalemia Replete and reevaluate in a.m.     DVT prophylaxis: Lovenox Code Status: Full Family Communication: Discussed with son on phone 2/28 Disposition Plan:  Status is: Inpatient Remains inpatient appropriate because: Ongoing altered mentation with need for IV medications.       Skin Assessment:   I have examined the patients skin and I agree with the wound assessment as performed by the wound care RN as outlined below:   Pressure Injury 12/16/21 Pretibial Left Ulcer (Active)  12/16/21 2100  Location: Pretibial  Location Orientation: Left  Staging:   Wound Description (Comments): Ulcer  Present on Admission: Yes     Pressure Injury 12/16/21 Buttocks Bilateral Stage 2 -  Partial thickness loss of dermis presenting as a shallow open injury with a red, pink wound bed without slough. (Active)  12/16/21 0210  Location: Buttocks  Location Orientation: Bilateral  Staging: Stage 2 -  Partial thickness loss of dermis presenting as a shallow open injury with a red, pink wound bed without slough.  Wound Description (Comments):   Present on Admission: Yes      Consultants:  None   Procedures:  See below for x-ray reports.  Antimicrobials:  Anti-infectives (From admission, onward)    Start     Dose/Rate Route Frequency Ordered Stop   12/20/21 0000   meropenem (MERREM) 500 mg in sodium chloride 0.9 % 100 mL IVPB        500 mg 200 mL/hr over 30 Minutes Intravenous Every 12 hours 12/19/21 1210     12/19/21 1215  meropenem (MERREM) 1 g in sodium chloride 0.9 % 100 mL IVPB        1 g 200 mL/hr over 30 Minutes Intravenous  Once 12/19/21 1115 12/19/21 1212   12/16/21 2000  cefTRIAXone (ROCEPHIN) 1 g in sodium chloride 0.9 % 100 mL IVPB  Status:  Discontinued        1 g 200 mL/hr over 30 Minutes Intravenous Every 24 hours 12/16/21 1903 12/19/21 1046   12/16/21 2000  fluconazole (DIFLUCAN) IVPB 100 mg        100 mg 50 mL/hr over 60 Minutes Intravenous Every 48 hours 12/16/21 1919 12/22/21 2359       Subjective: Foley catheter in place with yellow urine appreciated inside back.  Afebrile.  Continues to be intermittently lethargic/obtunded and disoriented.  Not following commands or being able to safely attempt oral intake.  NG tube has been placed successfully under fluoroscopy and nutrition has been started.  Objective: Vitals:   12/21/21 1600 12/21/21 1605 12/21/21 1634 12/21/21 1700  BP: 103/74   (!) 118/94  Pulse:   (!) 121   Resp: (!) 21  (!) 23 18  Temp:  98.2 F (36.8 C)    TempSrc:  Axillary    SpO2:   100%   Weight:      Height:        Intake/Output Summary (Last 24 hours) at 12/21/2021 1754 Last data filed at 12/21/2021 1751 Gross per 24 hour  Intake 2598.37 ml  Output 450 ml  Net 2148.37 ml   Filed Weights   12/19/21 0500 12/20/21 0410 12/21/21 0429  Weight: 92.4 kg 94.5 kg 94.9 kg    Examination: General exam: Obtunded, no following commands, 0 interaction and per nursing report intermittently lethargic and experiencing crying spells.  Continued to be disoriented.  No fever. Respiratory system: Not requiring oxygen supplementation; good air movement bilaterally.  No using accessory muscles. Cardiovascular system:RRR.  No rubs, no gallops, no murmurs appreciated on exam.  Unable to assess JVD with body  habitus. Gastrointestinal system: Abdomen is obese, nondistended, soft and nontender. No organomegaly or masses felt. Normal bowel sounds heard. Central nervous system: No focal neurological deficits. Extremities: No cyanosis or clubbing; left upper extremity with PICC line in place. Skin: No petechiae. Psychiatry: Unable to properly assess due to current mentation.  Data Reviewed: I have personally reviewed following labs and imaging studies  CBC: Recent Labs  Lab 12/16/21 1621 12/17/21 0500 12/18/21 0418 12/19/21 0652 12/21/21 0349  WBC 10.8* 11.1* 9.6 9.2 6.9  NEUTROABS 9.1*  --   --   --   --   HGB 13.7 12.4 10.9* 11.6* 10.7*  HCT 39.7 36.7 33.3* 36.9 33.0*  MCV 90.4 92.7 93.5 96.1 95.7  PLT 290 240 191 182 149   Basic Metabolic Panel: Recent Labs  Lab 12/16/21 1621 12/16/21 2119 12/17/21 0500 12/18/21 0418 12/19/21 0652 12/20/21 0427 12/21/21 0349  NA 136   < > 138 142 145 148* 150*  K 3.8   < > 3.5 2.6* 2.9* 2.5* 2.7*  CL 93*   < > 100 103 106 112* 115*  CO2 25   < > 21* 25 23 23 25   GLUCOSE 105*   < > 79 70 63* 110* 135*  BUN 158*   < > 156* 113* 118* 89* 74*  CREATININE 5.98*   < > 5.39* 4.75* 3.74* 2.63* 2.26*  CALCIUM 8.9   < > 8.8* 8.8* 8.9 8.8* 8.8*  MG 2.4  --   --  2.2 2.1 2.2 2.1  PHOS 5.4*  --   --   --   --   --   --    < > = values in this interval not displayed.   GFR: Estimated Creatinine Clearance: 23.1 mL/min (A) (by C-G formula based on SCr of 2.26 mg/dL (H)).   Recent Labs  Lab 12/20/21 1143  AMMONIA 18   Thyroid Function Tests: Recent Labs    12/20/21 1143  TSH 2.433   Recent Results (from the past 240 hour(s))  Resp Panel by RT-PCR (Flu A&B, Covid) Nasopharyngeal Swab     Status: None   Collection Time: 12/16/21  5:23 PM   Specimen: Nasopharyngeal Swab; Nasopharyngeal(NP) swabs in vial transport medium  Result Value Ref Range Status   SARS Coronavirus 2 by RT PCR NEGATIVE NEGATIVE Final    Comment: (NOTE) SARS-CoV-2 target  nucleic acids are NOT DETECTED.  The SARS-CoV-2 RNA is generally detectable in upper respiratory specimens during the acute phase of infection. The lowest concentration of SARS-CoV-2 viral copies this assay can detect is 138 copies/mL. A negative result does not preclude SARS-Cov-2  infection and should not be used as the sole basis for treatment or other patient management decisions. A negative result may occur with  improper specimen collection/handling, submission of specimen other than nasopharyngeal swab, presence of viral mutation(s) within the areas targeted by this assay, and inadequate number of viral copies(<138 copies/mL). A negative result must be combined with clinical observations, patient history, and epidemiological information. The expected result is Negative.  Fact Sheet for Patients:  EntrepreneurPulse.com.au  Fact Sheet for Healthcare Providers:  IncredibleEmployment.be  This test is no t yet approved or cleared by the [place] FDA and  has been authorized for detection and/or diagnosis of SARS-CoV-2 by FDA under an Emergency Use Authorization (EUA). This EUA will remain  in effect (meaning this test can be used) for the duration of the COVID-19 declaration under Section 564(b)(1) of the Act, 21 U.S.C.section 360bbb-3(b)(1), unless the authorization is terminated  or revoked sooner.       Influenza A by PCR NEGATIVE NEGATIVE Final   Influenza B by PCR NEGATIVE NEGATIVE Final    Comment: (NOTE) The Xpert Xpress SARS-CoV-2/FLU/RSV plus assay is intended as an aid in the diagnosis of influenza from Nasopharyngeal swab specimens and should not be used as a sole basis for treatment. Nasal washings and aspirates are unacceptable for Xpert Xpress SARS-CoV-2/FLU/RSV testing.  Fact Sheet for Patients: EntrepreneurPulse.com.au  Fact Sheet for Healthcare  Providers: IncredibleEmployment.be  This test is not yet approved or cleared by the [place] FDA and has been authorized for detection and/or diagnosis of SARS-CoV-2 by FDA under an Emergency Use Authorization (EUA). This EUA will remain in effect (meaning this test can be used) for the duration of the COVID-19 declaration under Section 564(b)(1) of the Act, 21 U.S.C. section 360bbb-3(b)(1), unless the authorization is terminated or revoked.  Performed at [facility], [address], [place], [place] [address]   Urine Culture     Status: Abnormal   Collection Time: 12/16/21  5:46 PM   Specimen: Urine, Catheterized  Result Value Ref Range Status   Specimen Description   Final    URINE, CATHETERIZED Performed at [facility], [address], [place], [place] [address]    Special Requests   Final    NONE Performed at [facility], [address], [place], [place] [address]    Culture (A)  Final    3,000 COLONIES/mL ESCHERICHIA COLI Confirmed Extended Spectrum Beta-Lactamase Producer (ESBL).  In bloodstream infections from ESBL organisms, carbapenems are preferred over piperacillin/tazobactam. They are shown to have a lower risk of mortality.    Report Status 12/19/2021 FINAL  Final   Organism ID, Bacteria ESCHERICHIA COLI (A)  Final      Susceptibility   Escherichia coli - MIC*    AMPICILLIN >=32 RESISTANT Resistant     CEFAZOLIN >=64 RESISTANT Resistant     CEFEPIME >=32 RESISTANT Resistant     CEFTRIAXONE >=64 RESISTANT Resistant     CIPROFLOXACIN >=4 RESISTANT Resistant     GENTAMICIN >=16 RESISTANT Resistant     IMIPENEM <=0.25 SENSITIVE Sensitive     NITROFURANTOIN 128 RESISTANT Resistant     TRIMETH/SULFA <=20 SENSITIVE Sensitive     AMPICILLIN/SULBACTAM >=32 RESISTANT Resistant     PIP/TAZO 8 SENSITIVE Sensitive     * 3,000 COLONIES/mL ESCHERICHIA COLI  MRSA Next Gen by PCR, Nasal     Status: None   Collection Time: 12/16/21  8:12 PM   Specimen:  Nasal Mucosa; Nasal Swab  Result Value Ref Range Status  MRSA by PCR Next Gen NOT DETECTED NOT DETECTED Final    Comment: (NOTE) The GeneXpert MRSA Assay (FDA approved for NASAL specimens only), is one component of a comprehensive MRSA colonization surveillance program. It is not intended to diagnose MRSA infection nor to guide or monitor treatment for MRSA infections. Test performance is not FDA approved in patients less than 17 years old. Performed at [facility], [address] [place], [place] [address]     Radiology Studies: CT HEAD WO CONTRAST (5MM)  Result Date: 12/20/2021 CLINICAL DATA:  76 year old female with altered mental status. Crying. EXAM: CT HEAD WITHOUT CONTRAST TECHNIQUE: Contiguous axial images were obtained from the base of the skull through the vertex without intravenous contrast. RADIATION DOSE REDUCTION: This exam was performed according to the departmental dose-optimization program which includes automated exposure control, adjustment of the mA and/or kV according to patient size and/or use of iterative reconstruction technique. COMPARISON:  Brain MRI and head CT 07/20/2021. FINDINGS: Brain: Cerebral volume is stable and within normal limits for age. No midline shift, ventriculomegaly, mass effect, evidence of mass lesion, intracranial hemorrhage or evidence of cortically based acute infarction. Patchy and confluent bilateral cerebral white matter hypodensity is mostly periventricular and stable. Small area of chronic cortical encephalomalacia at the inferior left occipital pole is stable, best seen on series 4, image 62. No cortical encephalomalacia identified. Vascular: Calcified atherosclerosis at the skull base. No suspicious intracranial vascular hyperdensity. Skull: Stable hyperostosis of the calvarium. No acute osseous abnormality identified. Sinuses/Orbits: Mild bubbly opacity in the right sphenoid sinus but otherwise paranasal sinuses and mastoids are stable and  well aerated. Other: Visualized orbits and scalp soft tissues are within normal limits. IMPRESSION: 1. No acute intracranial abnormality. 2. Chronic white matter disease. Small chronic infarct in the left PCA territory. Electronically Signed   By: Genevie Ann M.D.   On: 12/20/2021 11:38   DG [place]ce Dys Tube Plc W/Fl W/Rad  Result Date: 12/21/2021 CLINICAL DATA:  Unable to pass NG tube in ICU EXAM: NASO G TUBE PLACEMENT WITH FL AND WITH RAD CONTRAST:  None FLUOROSCOPY: Fluoroscopy Time:  15 minutes 48 seconds Radiation Exposure Index (if provided by the fluoroscopic device): 494 mGy Number of Acquired Spot Images: 4 COMPARISON:  CT abdomen and pelvis 12/16/2021 FINDINGS: Patient unable to cooperate with positioning, remaining with neck hyper extended and unable to follow commands/swallow. Requested 66 French feeding tube was placed from the LEFT nostril into esophagus after multiple attempts. A guidewire was utilized to stiff in the tube and place tube into the stomach. Unable to utilize a guidewire to pass tube across the pylorus into the duodenum. Tube was LEFT in the stomach. IMPRESSION: Nasogastric tube placed into the stomach under fluoroscopy. Electronically Signed   By: Lavonia Dana M.D.   On: 12/21/2021 12:11   [place] EKG SITE RITE  Result Date: 12/20/2021 If [facility] image not attached, placement could not be confirmed due to current cardiac rhythm.    Scheduled Meds:  vitamin C  500 mg Oral BID   Chlorhexidine Gluconate Cloth  6 each Topical Q0600   enoxaparin (LOVENOX) injection  30 mg Subcutaneous Q24H   feeding supplement (PROSource TF)  45 mL Per Tube BID   multivitamin with minerals  1 tablet Oral Daily   predniSONE  9 mg Oral Q breakfast   sodium chloride flush  10-40 mL Intracatheter Q12H   traZODone  25 mg Oral QHS   zinc sulfate  220 mg Oral Daily  Continuous Infusions:  dextrose 5% lactated ringers 100 mL/hr at 12/21/21 1638   feeding supplement (OSMOLITE 1.5 CAL) 1,000 mL (12/21/21  1500)   fluconazole (DIFLUCAN) IV 100 mg (12/20/21 1915)   meropenem (MERREM) IV Stopped (12/21/21 1407)     LOS: 5 days    Barton Dubois, MD [facility]  If 7PM-7AM, please contact night-coverage [email or web address] 12/21/2021, 5:54 PM

## 2021-12-22 LAB — GLUCOSE, CAPILLARY: Glucose-Capillary: 116 mg/dL — ABNORMAL HIGH (ref 70–99)

## 2021-12-22 LAB — PHOSPHORUS
Phosphorus: 1.9 mg/dL — ABNORMAL LOW (ref 2.5–4.6)
Phosphorus: 1.3 mg/dL — ABNORMAL LOW (ref 2.5–4.6)

## 2021-12-22 LAB — MAGNESIUM
Magnesium: 2.1 mg/dL (ref 1.7–2.4)
Magnesium: 2.3 mg/dL (ref 1.7–2.4)

## 2021-12-22 MED ORDER — POTASSIUM CHLORIDE CRYS ER 20 MEQ PO TBCR: 40.0000 meq | EXTENDED_RELEASE_TABLET | ORAL | Status: DC

## 2021-12-22 MED ORDER — POTASSIUM PHOSPHATES 15 MMOLE/5ML IV SOLN
30.0000 mmol | Freq: Once | INTRAVENOUS | Status: DC
  Filled 2021-12-22: qty 10

## 2021-12-22 MED ORDER — POTASSIUM CHLORIDE 20 MEQ PO PACK
40.0000 meq | PACK | ORAL | Status: AC
  Administered 2021-12-22 (×2): 40 meq
  Filled 2021-12-22 (×2): qty 2

## 2021-12-22 MED ORDER — MAGNESIUM SULFATE IN D5W 1-5 GM/100ML-% IV SOLN
1.0000 g | Freq: Once | INTRAVENOUS | Status: AC
  Administered 2021-12-22: 1 g via INTRAVENOUS
  Filled 2021-12-22: qty 100

## 2021-12-22 MED ORDER — POTASSIUM PHOSPHATES 15 MMOLE/5ML IV SOLN
30.0000 mmol | Freq: Once | INTRAVENOUS | Status: AC
  Administered 2021-12-22: 30 mmol via INTRAVENOUS
  Filled 2021-12-22: qty 10

## 2021-12-22 NOTE — Progress Notes (Signed)
Nutrition Follow-up ? ?DOCUMENTATION CODES:  ? ?Obesity unspecified ? ?INTERVENTION:  ?- Continue Osmolite 1.5 at goal rate of 87mL/hr (1200 ml per day) ?- Prosource TF 45 ml BID ?- Free water flushes of 100 mL Q4H ?- Continue to monitor magnesium, potassium and phosphorus BID for 2 more days d/t refeeding risk, MD to replete as needed ? ?NUTRITION DIAGNOSIS:  ? ?Inadequate oral intake related to inability to eat as evidenced by NPO status. ? ?Ongoing ? ?GOAL:  ? ?Patient will meet greater than or equal to 90% of their needs ? ?Addressing needs with tube feeding via NG tube ? ?MONITOR:  ? ?PO intake, Supplement acceptance, Labs, Weight trends, Skin, I & O's ? ?REASON FOR ASSESSMENT:  ? ?Malnutrition Screening Tool ?  ? ?ASSESSMENT:  ? ?Nicole [name] is a 76 y.o. female with medical history significant of HFpEF, HTN, polymyalgia rheumatica on chronic prednisone, GERN, spinal stenosis. She has been hospitalized twice in the past 3 months for AKI, abdominal pain and psoas abscess. She has been in a SNF for rehab since being discharged in middle of December. Her Nicole [name] gives the history - she was recently placed on antibiotics for a lower leg cellulits stemming from a chronic ulceration. Over the past several days, the patient has had very little oral intake of both food and water.  The doctor was going to place her on Remeron and she got a dose last night.  Today, she started having severe abdominal pain and had some blood work done.  The blood work showed an elevated creatinine and was transported to the hospital for evaluation.  Here, the patient was found to have bladder retention with approximately 500 mL of urine in the bladder.  She was catheterized after given pain medicine. ? ?Pt remains altered. Plans for d/c to [facility] tomorrow.  ? ?Tube feeds infusing at goal rate. Reached out to MD regarding repletion of electrolytes d/t refeeding.  ? ?Current weight: 94.9 kg ? ?IV drips: D5 in LR @ 140mL/hr, potassium phosphate  in D5 ? ?Labs: sodium 150, potassium 2.7 (replacing), BUN 74, Cr 2.26, Phos 1.9 (replacing) ? ?Diet Order:   ?Diet Order   ? ? None  ? ?  ? ? ?EDUCATION NEEDS:  ? ?No education needs have been identified at this time ? ?Skin:  Skin Assessment: Skin Integrity Issues: ?Skin Integrity Issues:: Stage II, Other (Comment) ?Stage II: buttocks ?Other: pressure ulcer to L pretibial ? ?Last BM:  3/2 (type 6/7) ? ?Height:  ? ?Ht Readings from Last 1 Encounters:  ?12/16/21 5\' 2"  (1.575 m)  ? ? ?Weight:  ? ?Wt Readings from Last 1 Encounters:  ?12/21/21 94.9 kg  ? ? ?Ideal Body Weight:  50 kg ? ?BMI:  Body mass index is 38.27 kg/m?. ? ?Estimated Nutritional Needs:  ? ?Kcal:  1800-2000 ? ?Protein:  90-105g ? ?Fluid:  > 1.8 L ? ?Clayborne Dana, RDN, LDN ?Clinical Nutrition ?

## 2021-12-22 NOTE — TOC Progression Note (Signed)
Transition of Care (TOC) - Progression Note  ? ? ?Patient Details  ?Name: Nicole [name] ?MRN: [id number] ?Date of Birth: Jun 30, 1946 ? ?Transition of Care (TOC) CM/SW Contact  ?Boneta Lucks, RN ?Phone Number: ?12/22/2021, 11:36 AM ? ?Clinical Narrative:   [facility] offering [facility] bed. Son is accepting the bed offered. TOC working with Jaquelyn Bitter at [facility]	[place] to transfer patient. MD aware to do DC summary, however transport may be tomorrow. TOC to follow and set up transport when [facility] confirms bed.  ? ? ?Expected Discharge Plan: [place] ([facility]) ?Barriers to Discharge: Continued Medical Work up ? ?Expected Discharge Plan and Services ?Expected Discharge Plan: [place] (LTAC) ?  ?   ?Readmission Risk Interventions ?Readmission Risk Prevention Plan 10/04/2021 09/26/2021  ?Transportation Screening Complete Complete  ?[place] or Home Care Consult Complete Complete  ?Social Work Consult for [place] Planning/Counseling Complete Complete  ?Palliative Care Screening Not Applicable Not Applicable  ?Medication Review Press photographer) Complete Complete  ?Some recent data might be hidden  ? ? ?

## 2021-12-22 NOTE — Progress Notes (Signed)
PROGRESS NOTE    Nicole [name]  ZOX:[id number] DOB: [birth year] DOA: 12/16/2021 PCP: Jake Samples, PA-C   Brief Narrative:  Per HPI: Nicole [name] is a 76 y.o. female with medical history significant of HFpEF, HTN, polymyalgia rheumatica on chronic prednisone, GERN, spinal stenosis. She has been hospitalized twice in the past 3 months for AKI, abdominal pain and psoas abscess. She has been in a SNF for rehab since being discharged in middle of December. Her son gives the history - she was recently placed on antibiotics for a lower leg cellulits stemming from a chronic ulceration. Over the past several days, the patient has had very little oral intake of both food and water.  The doctor was going to place her on Remeron and she got a dose last night.  Today, she started having severe abdominal pain and had some blood work done.  The blood work showed an elevated creatinine and was transported to the hospital for evaluation.  Here, the patient was found to have bladder retention with approximately 500 mL of urine in the bladder.  She was catheterized after given pain medicine.  12/17/21: Patient was admitted with altered mentation in the setting of UTI as well as acute renal failure with acute urinary retention.  She will require Foley catheter placement today and ongoing IV fluid.  Creatinine slowly downtrending.  12/18/21: Patient continues to have altered mentation, but creatinine levels show signs of improvement in with decent urine output.  She continues to remain on treatment for her UTI.  12/19/21: Patient becomes tearful and is and is otherwise somnolent.  Creatinine levels are gradually improving.  She will require D5 IV fluid to help maintain her blood glucose levels.  Rocephin will be changed to Merrem due to noted ESBL in urine.  12/20/21: Patient continues to remain quite altered, but creatinine levels are gradually improving.  NG tube to be inserted with tube feedings to initiate today.   Continue ongoing treatment with antibiotics for now as ordered and may consider discontinuation in the next 1-2 days.   12/21/21: Patient remains altered, disoriented and intermittently experiencing lethargy and crying spells.  Unsafe to take oral intakes.  Status post NG tube placement under fluoroscopy and initiation of tube feedings.  Continue antibiotics and 1 more day of Diflucan.  Continue IV fluids and follow renal function trend.  Prognosis is guarded.  12/22/21: Overall prognosis remain guarded; patient continued to be obtunded and encephalopathic.  We will continue antibiotic therapy and complete Diflucan treatment today; continue nutrition hydration and medications through NG tube at this time.  Hopefully discharge to [facility] facility for further care and rehab on 12/23/2021.  Anticipating prolonged recovery pathway (if able to respond)..  Assessment & Plan:   Principal Problem:   Acute renal failure (ARF) (HCC) Active Problems:   GERD (gastroesophageal reflux disease)   HTN (hypertension)   Chronic pain syndrome   Polymyalgia rheumatica (HCC)   Morbid obesity with BMI of 50.0-59.9, adult (HCC)   Chronic diastolic HF (heart failure) ([place])   Acute urinary retention   Leg wound, left   UTI (urinary tract infection)   Pressure injury of skin  Assessment and Plan:   AKI-likely postrenal given urinary retention. Secondary to dehydration and urinary retention, baseline creatinine 0.8-0.9 Creatinine continues slowly improving. Will continue to hydrate with D5LR 100 mL per hour Recheck creatinine later tonight and tomorrow morning The patient has had previous episodes of AKI, although not quite this severe.  The patient has always  responded well to IV fluids.  Continue to follow renal function trend. Acute urinary retention Uncertain whether this is secondary to infection, obstruction, neurogenic bladder. Will obtain bladder scan about every 4 hours.  If it appears that the patient is  retaining urine despite getting her up to the commode, continue indwelling catheter at this time with plans for urology outpatient follow-up. Acute metabolic encephalopathy ongoing secondary to AKI and E Coli ESBL and Candida UTI Budding yeast and rare bacteria on UA Culture with E. coli and minimal growth with sensitivities pending Repeat CBC in the morning Continue Diflucan and change Rocephin to Merrem for improved coverage Continue to monitor neurologically for signs of improvement Placement of NG tube under fluoroscopy, with tube feedings initiation today (12/21/2021) due to ongoing encephalopathic state TSH, ammonia level and CT head within normal limits. Left lower leg wound ABI with moderate PAD noted with no overt signs of cellulitis.  Will need vascular follow-up outpatient. Heart failure with preserved EF Patient currently dehydrated.  Will hold diuretics Chronic pain with polymyalgia rheumatica Patient on steroids, which we will continue Hypertension GERD Morbid obesity Lifestyle modification Hypokalemia Replete and reevaluate in a.m.     DVT prophylaxis: Lovenox Code Status: Full Family Communication: Discussed with son on phone 12/22/21 Disposition Plan:  Status is: Inpatient Remains inpatient appropriate because: Ongoing altered mentation with need for IV medications.  After discussion with family members plan is for patient to be transferred to [facility] facility in order to pursued further treatment and management along with rehabilitation.       Skin Assessment:   I have examined the patients skin and I agree with the wound assessment as performed by the wound care RN as outlined below:   Pressure Injury 12/16/21 Pretibial Left Ulcer (Active)  12/16/21 2100  Location: Pretibial  Location Orientation: Left  Staging:   Wound Description (Comments): Ulcer  Present on Admission: Yes     Pressure Injury 12/16/21 Buttocks Bilateral Stage 2 -  Partial thickness loss of  dermis presenting as a shallow open injury with a red, pink wound bed without slough. (Active)  12/16/21 0210  Location: Buttocks  Location Orientation: Bilateral  Staging: Stage 2 -  Partial thickness loss of dermis presenting as a shallow open injury with a red, pink wound bed without slough.  Wound Description (Comments):   Present on Admission: Yes      Consultants:  None   Procedures:  See below for x-ray reports.  Antimicrobials:  Anti-infectives (From admission, onward)    Start     Dose/Rate Route Frequency Ordered Stop   12/20/21 0000  meropenem (MERREM) 500 mg in sodium chloride 0.9 % 100 mL IVPB        500 mg 200 mL/hr over 30 Minutes Intravenous Every 12 hours 12/19/21 1210 12/25/21 0948   12/19/21 1215  meropenem (MERREM) 1 g in sodium chloride 0.9 % 100 mL IVPB        1 g 200 mL/hr over 30 Minutes Intravenous  Once 12/19/21 1115 12/19/21 1212   12/16/21 2000  cefTRIAXone (ROCEPHIN) 1 g in sodium chloride 0.9 % 100 mL IVPB  Status:  Discontinued        1 g 200 mL/hr over 30 Minutes Intravenous Every 24 hours 12/16/21 1903 12/19/21 1046   12/16/21 2000  fluconazole (DIFLUCAN) IVPB 100 mg        100 mg 50 mL/hr over 60 Minutes Intravenous Every 48 hours 12/16/21 1919 12/22/21 2359  Subjective: Foley catheter in place with good urine output appreciated Patient's Foley bag; so far tolerating nutrition through NG tube which was successfully placed on 12/21/2021.  Patient remains obtunded and encephalopathic.  Not following commands.  Objective: Vitals:   12/22/21 1200 12/22/21 1300 12/22/21 1400 12/22/21 1500  BP: 107/88 121/90 106/71 (!) 106/91  Pulse: (!) 25 (!) 140 90 (!) 58  Resp: 20 (!) 27 (!) 22 (!) 23  Temp:    98.5 F (36.9 C)  TempSrc:    Axillary  SpO2:      Weight:      Height:        Intake/Output Summary (Last 24 hours) at 12/22/2021 1844 Last data filed at 12/22/2021 1154 Gross per 24 hour  Intake 1846.35 ml  Output 200 ml  Net 1646.35 ml    Filed Weights   12/19/21 0500 12/20/21 0410 12/21/21 0429  Weight: 92.4 kg 94.5 kg 94.9 kg    Examination: General exam: Patient remains obtunded, not following commands and receiving medications/nutrition through NG tube.  Afebrile and otherwise hemodynamically stable. Respiratory system: Clear to auscultation. Respiratory effort normal.  Good saturation on room air; no using accessory muscles. Cardiovascular system: Sinus tachycardia.. No murmurs, rubs, gallops.  Unable to properly assess JVD with body habitus. Gastrointestinal system: Abdomen is obese, nondistended, soft and nontender. No organomegaly or masses felt. Normal bowel sounds heard. Central nervous system: Unable to properly assess with current mentation; no focal deficits seen.  Moving limbs spontaneously, but not following commands. Extremities: No cyanosis or clubbing.  Left upper extremity with PICC line in place; no signs of infection or drainage. Skin: No petechiae. Psychiatry: Judgment and insight unable to be assessed due to current encephalopathic process.  Data Reviewed: I have personally reviewed following labs and imaging studies  CBC: Recent Labs  Lab 12/16/21 1621 12/17/21 0500 12/18/21 0418 12/19/21 0652 12/21/21 0349  WBC 10.8* 11.1* 9.6 9.2 6.9  NEUTROABS 9.1*  --   --   --   --   HGB 13.7 12.4 10.9* 11.6* 10.7*  HCT 39.7 36.7 33.3* 36.9 33.0*  MCV 90.4 92.7 93.5 96.1 95.7  PLT 290 240 191 182 027   Basic Metabolic Panel: Recent Labs  Lab 12/16/21 1621 12/16/21 2119 12/17/21 0500 12/18/21 0418 12/19/21 0652 12/20/21 0427 12/21/21 0349 12/22/21 0939 12/22/21 1650  NA 136   < > 138 142 145 148* 150*  --   --   K 3.8   < > 3.5 2.6* 2.9* 2.5* 2.7*  --   --   CL 93*   < > 100 103 106 112* 115*  --   --   CO2 25   < > 21* 25 23 23 25   --   --   GLUCOSE 105*   < > 79 70 63* 110* 135*  --   --   BUN 158*   < > 156* 113* 118* 89* 74*  --   --   CREATININE 5.98*   < > 5.39* 4.75* 3.74* 2.63*  2.26*  --   --   CALCIUM 8.9   < > 8.8* 8.8* 8.9 8.8* 8.8*  --   --   MG 2.4  --   --  2.2 2.1 2.2 2.1 2.1 2.3  PHOS 5.4*  --   --   --   --   --   --  1.9* 1.3*   < > = values in this interval not displayed.   GFR: Estimated Creatinine  Clearance: 23.1 mL/min (A) (by C-G formula based on SCr of 2.26 mg/dL (H)).   Recent Labs  Lab 12/20/21 1143  AMMONIA 18   Thyroid Function Tests: Recent Labs    12/20/21 1143  TSH 2.433   Recent Results (from the past 240 hour(s))  Resp Panel by RT-PCR (Flu A&B, Covid) Nasopharyngeal Swab     Status: None   Collection Time: 12/16/21  5:23 PM   Specimen: Nasopharyngeal Swab; Nasopharyngeal(NP) swabs in vial transport medium  Result Value Ref Range Status   SARS Coronavirus 2 by RT PCR NEGATIVE NEGATIVE Final    Comment: (NOTE) SARS-CoV-2 target nucleic acids are NOT DETECTED.  The SARS-CoV-2 RNA is generally detectable in upper respiratory specimens during the acute phase of infection. The lowest concentration of SARS-CoV-2 viral copies this assay can detect is 138 copies/mL. A negative result does not preclude SARS-Cov-2 infection and should not be used as the sole basis for treatment or other patient management decisions. A negative result may occur with  improper specimen collection/handling, submission of specimen other than nasopharyngeal swab, presence of viral mutation(s) within the areas targeted by this assay, and inadequate number of viral copies(<138 copies/mL). A negative result must be combined with clinical observations, patient history, and epidemiological information. The expected result is Negative.  Fact Sheet for Patients:  EntrepreneurPulse.com.au  Fact Sheet for Healthcare Providers:  IncredibleEmployment.be  This test is no t yet approved or cleared by the [place] FDA and  has been authorized for detection and/or diagnosis of SARS-CoV-2 by FDA under an Emergency Use  Authorization (EUA). This EUA will remain  in effect (meaning this test can be used) for the duration of the COVID-19 declaration under Section 564(b)(1) of the Act, 21 U.S.C.section 360bbb-3(b)(1), unless the authorization is terminated  or revoked sooner.       Influenza A by PCR NEGATIVE NEGATIVE Final   Influenza B by PCR NEGATIVE NEGATIVE Final    Comment: (NOTE) The Xpert Xpress SARS-CoV-2/FLU/RSV plus assay is intended as an aid in the diagnosis of influenza from Nasopharyngeal swab specimens and should not be used as a sole basis for treatment. Nasal washings and aspirates are unacceptable for Xpert Xpress SARS-CoV-2/FLU/RSV testing.  Fact Sheet for Patients: EntrepreneurPulse.com.au  Fact Sheet for Healthcare Providers: IncredibleEmployment.be  This test is not yet approved or cleared by the [place] FDA and has been authorized for detection and/or diagnosis of SARS-CoV-2 by FDA under an Emergency Use Authorization (EUA). This EUA will remain in effect (meaning this test can be used) for the duration of the COVID-19 declaration under Section 564(b)(1) of the Act, 21 U.S.C. section 360bbb-3(b)(1), unless the authorization is terminated or revoked.  Performed at [facility], [address] [place], [place] [address]   Urine Culture     Status: Abnormal   Collection Time: 12/16/21  5:46 PM   Specimen: Urine, Catheterized  Result Value Ref Range Status   Specimen Description   Final    URINE, CATHETERIZED Performed at [facility], [address], [place], [place] [address]    Special Requests   Final    NONE Performed at [facility], [address] [place], [place] [address]    Culture (A)  Final    3,000 COLONIES/mL ESCHERICHIA COLI Confirmed Extended Spectrum Beta-Lactamase Producer (ESBL).  In bloodstream infections from ESBL organisms, carbapenems are preferred over piperacillin/tazobactam. They are shown to have a  lower risk of mortality.    Report Status 12/19/2021 FINAL  Final  Organism ID, Bacteria ESCHERICHIA COLI (A)  Final      Susceptibility   Escherichia coli - MIC*    AMPICILLIN >=32 RESISTANT Resistant     CEFAZOLIN >=64 RESISTANT Resistant     CEFEPIME >=32 RESISTANT Resistant     CEFTRIAXONE >=64 RESISTANT Resistant     CIPROFLOXACIN >=4 RESISTANT Resistant     GENTAMICIN >=16 RESISTANT Resistant     IMIPENEM <=0.25 SENSITIVE Sensitive     NITROFURANTOIN 128 RESISTANT Resistant     TRIMETH/SULFA <=20 SENSITIVE Sensitive     AMPICILLIN/SULBACTAM >=32 RESISTANT Resistant     PIP/TAZO 8 SENSITIVE Sensitive     * 3,000 COLONIES/mL ESCHERICHIA COLI  MRSA Next Gen by PCR, Nasal     Status: None   Collection Time: 12/16/21  8:12 PM   Specimen: Nasal Mucosa; Nasal Swab  Result Value Ref Range Status   MRSA by PCR Next Gen NOT DETECTED NOT DETECTED Final    Comment: (NOTE) The GeneXpert MRSA Assay (FDA approved for NASAL specimens only), is one component of a comprehensive MRSA colonization surveillance program. It is not intended to diagnose MRSA infection nor to guide or monitor treatment for MRSA infections. Test performance is not FDA approved in patients less than 77 years old. Performed at [facility], [address] [place], [place] [address]     Radiology Studies: DG Loyce Dys Tube Plc W/Fl W/Rad  Result Date: 12/21/2021 CLINICAL DATA:  Unable to pass NG tube in ICU EXAM: NASO G TUBE PLACEMENT WITH FL AND WITH RAD CONTRAST:  None FLUOROSCOPY: Fluoroscopy Time:  15 minutes 48 seconds Radiation Exposure Index (if provided by the fluoroscopic device): 494 mGy Number of Acquired Spot Images: 4 COMPARISON:  CT abdomen and pelvis 12/16/2021 FINDINGS: Patient unable to cooperate with positioning, remaining with neck hyper extended and unable to follow commands/swallow. Requested 14 French feeding tube was placed from the LEFT nostril into esophagus after multiple attempts. A guidewire was  utilized to stiff in the tube and place tube into the stomach. Unable to utilize a guidewire to pass tube across the pylorus into the duodenum. Tube was LEFT in the stomach. IMPRESSION: Nasogastric tube placed into the stomach under fluoroscopy. Electronically Signed   By: Lavonia Dana M.D.   On: 12/21/2021 12:11     Scheduled Meds:  vitamin C  500 mg Oral BID   Chlorhexidine Gluconate Cloth  6 each Topical Q0600   enoxaparin (LOVENOX) injection  30 mg Subcutaneous Q24H   feeding supplement (PROSource TF)  45 mL Per Tube BID   multivitamin with minerals  1 tablet Oral Daily   predniSONE  9 mg Oral Q breakfast   sodium chloride flush  10-40 mL Intracatheter Q12H   traZODone  25 mg Oral QHS   zinc sulfate  220 mg Oral Daily   Continuous Infusions:  dextrose 5% lactated ringers 100 mL/hr at 12/22/21 1200   feeding supplement (OSMOLITE 1.5 CAL) 1,000 mL (12/22/21 1500)   fluconazole (DIFLUCAN) IV 100 mg (12/20/21 1915)   meropenem (MERREM) IV 500 mg (12/22/21 1159)   potassium PHOSPHATE IVPB (in mmol)       LOS: 6 days    Barton Dubois, MD [facility]  If 7PM-7AM, please contact night-coverage [email or web address] 12/22/2021, 6:44 PM

## 2021-12-22 NOTE — Progress Notes (Signed)
Pharmacy Antibiotic Note ? ?Nicole [name] is a 76 y.o. female admitted on 12/16/2021 with UTI.  Pharmacy has been consulted for meropenem and fluconazole dosing. ? ?Plan: ?Meropenem 500 mg IV every 12 hours. X 3 more days per MD  ?Fluconazole  100mg  IV q 48hrs, last dose tonight  ?Monitor renal function closely ?F/u Ucx ?Monitor labs, c/s, and patient improvement. ? ?Height: 5\' 2"  (157.5 cm) ?Weight: 94.9 kg (209 lb 3.5 oz) ?IBW/kg (Calculated) : 50.1 ? ?Temp (24hrs), Avg:98.5 ?F (36.9 ?C), Min:98 ?F (36.7 ?C), Max:99.4 ?F (37.4 ?C) ? ?Recent Labs  ?Lab 12/16/21 ?1621 12/16/21 ?2119 12/17/21 ?0500 12/18/21 ?0418 12/19/21 ?4035 12/20/21 ?0427 12/21/21 ?2481  ?WBC 10.8*  --  11.1* 9.6 9.2  --  6.9  ?CREATININE 5.98*   < > 5.39* 4.75* 3.74* 2.63* 2.26*  ? < > = values in this interval not displayed.  ? ?  ?Estimated Creatinine Clearance: 23.1 mL/min (A) (by C-G formula based on SCr of 2.26 mg/dL (H)).   ? ?Allergies  ?Allergen Reactions  ? Oxycontin [Oxycodone Hcl] Swelling  ?  Pt tolerates hydromorphone.  ? Sulfa Antibiotics Other (See Comments)  ?  Sores  ? Tramadol   ?  "Makes me feel like I am falling"  ? Penicillins Rash  ?  Tolerated augmentin ?  ? Sulfasalazine Other (See Comments)  ?  Sores  ? ? ?Antimicrobials this admission: ?CTX 2/24 >> ?Fluconazole 2/24 >> ? ? ? ?Microbiology results: ?2/24 Ucx: 3000 ESBL e. Coli ?MRSA PCR: neg ? ?Thank you for allowing pharmacy to be a part of this patient?s care. ? ?Nicole [name] ?12/22/2021 9:48 AM ? ?

## 2021-12-23 ENCOUNTER — Other Ambulatory Visit (HOSPITAL_COMMUNITY): Payer: Self-pay

## 2021-12-23 ENCOUNTER — Inpatient Hospital Stay
Admission: AD | Admit: 2021-12-23 | Discharge: 2022-02-03 | Disposition: A | Discharge status: Other Acute Inpatient Hospital | Payer: Self-pay | Source: Other Acute Inpatient Hospital

## 2021-12-23 DIAGNOSIS — E876 Hypokalemia: Secondary | ICD-10-CM | POA: Diagnosis not present

## 2021-12-23 DIAGNOSIS — I5033 Acute on chronic diastolic (congestive) heart failure: Secondary | ICD-10-CM | POA: Diagnosis not present

## 2021-12-23 DIAGNOSIS — Z1612 Extended spectrum beta lactamase (ESBL) resistance: Secondary | ICD-10-CM | POA: Diagnosis not present

## 2021-12-23 DIAGNOSIS — Z4659 Encounter for fitting and adjustment of other gastrointestinal appliance and device: Secondary | ICD-10-CM

## 2021-12-23 DIAGNOSIS — I130 Hypertensive heart and chronic kidney disease with heart failure and stage 1 through stage 4 chronic kidney disease, or unspecified chronic kidney disease: Secondary | ICD-10-CM | POA: Diagnosis not present

## 2021-12-23 DIAGNOSIS — N3000 Acute cystitis without hematuria: Secondary | ICD-10-CM | POA: Diagnosis not present

## 2021-12-23 DIAGNOSIS — N390 Urinary tract infection, site not specified: Secondary | ICD-10-CM | POA: Diagnosis not present

## 2021-12-23 DIAGNOSIS — R339 Retention of urine, unspecified: Secondary | ICD-10-CM | POA: Diagnosis not present

## 2021-12-23 DIAGNOSIS — Z0189 Encounter for other specified special examinations: Secondary | ICD-10-CM

## 2021-12-23 DIAGNOSIS — I110 Hypertensive heart disease with heart failure: Secondary | ICD-10-CM | POA: Diagnosis not present

## 2021-12-23 DIAGNOSIS — M353 Polymyalgia rheumatica: Secondary | ICD-10-CM | POA: Diagnosis not present

## 2021-12-23 DIAGNOSIS — F339 Major depressive disorder, recurrent, unspecified: Secondary | ICD-10-CM | POA: Diagnosis not present

## 2021-12-23 DIAGNOSIS — K219 Gastro-esophageal reflux disease without esophagitis: Secondary | ICD-10-CM | POA: Diagnosis not present

## 2021-12-23 DIAGNOSIS — B9620 Unspecified Escherichia coli [E. coli] as the cause of diseases classified elsewhere: Secondary | ICD-10-CM | POA: Diagnosis not present

## 2021-12-23 DIAGNOSIS — B3741 Candidal cystitis and urethritis: Secondary | ICD-10-CM | POA: Diagnosis not present

## 2021-12-23 DIAGNOSIS — N179 Acute kidney failure, unspecified: Secondary | ICD-10-CM | POA: Diagnosis not present

## 2021-12-23 DIAGNOSIS — G8929 Other chronic pain: Secondary | ICD-10-CM | POA: Diagnosis not present

## 2021-12-23 DIAGNOSIS — Z6841 Body Mass Index (BMI) 40.0 and over, adult: Secondary | ICD-10-CM | POA: Diagnosis not present

## 2021-12-23 DIAGNOSIS — M6281 Muscle weakness (generalized): Secondary | ICD-10-CM | POA: Diagnosis not present

## 2021-12-23 DIAGNOSIS — I739 Peripheral vascular disease, unspecified: Secondary | ICD-10-CM | POA: Diagnosis not present

## 2021-12-23 DIAGNOSIS — I959 Hypotension, unspecified: Secondary | ICD-10-CM | POA: Diagnosis not present

## 2021-12-23 DIAGNOSIS — I5030 Unspecified diastolic (congestive) heart failure: Secondary | ICD-10-CM | POA: Diagnosis not present

## 2021-12-23 DIAGNOSIS — R279 Unspecified lack of coordination: Secondary | ICD-10-CM | POA: Diagnosis not present

## 2021-12-23 DIAGNOSIS — B3749 Other urogenital candidiasis: Secondary | ICD-10-CM | POA: Diagnosis not present

## 2021-12-23 DIAGNOSIS — Z4682 Encounter for fitting and adjustment of non-vascular catheter: Secondary | ICD-10-CM | POA: Diagnosis not present

## 2021-12-23 DIAGNOSIS — I5032 Chronic diastolic (congestive) heart failure: Secondary | ICD-10-CM | POA: Diagnosis not present

## 2021-12-23 DIAGNOSIS — N1831 Chronic kidney disease, stage 3a: Secondary | ICD-10-CM | POA: Diagnosis not present

## 2021-12-23 DIAGNOSIS — Z95828 Presence of other vascular implants and grafts: Secondary | ICD-10-CM

## 2021-12-23 DIAGNOSIS — L89302 Pressure ulcer of unspecified buttock, stage 2: Secondary | ICD-10-CM

## 2021-12-23 DIAGNOSIS — R41841 Cognitive communication deficit: Secondary | ICD-10-CM | POA: Diagnosis not present

## 2021-12-23 DIAGNOSIS — D84821 Immunodeficiency due to drugs: Secondary | ICD-10-CM | POA: Diagnosis not present

## 2021-12-23 DIAGNOSIS — M488X6 Other specified spondylopathies, lumbar region: Secondary | ICD-10-CM | POA: Diagnosis not present

## 2021-12-23 DIAGNOSIS — R0989 Other specified symptoms and signs involving the circulatory and respiratory systems: Secondary | ICD-10-CM

## 2021-12-23 DIAGNOSIS — R1310 Dysphagia, unspecified: Secondary | ICD-10-CM | POA: Diagnosis not present

## 2021-12-23 DIAGNOSIS — J9811 Atelectasis: Secondary | ICD-10-CM | POA: Diagnosis not present

## 2021-12-23 DIAGNOSIS — E785 Hyperlipidemia, unspecified: Secondary | ICD-10-CM | POA: Diagnosis not present

## 2021-12-23 DIAGNOSIS — S81802D Unspecified open wound, left lower leg, subsequent encounter: Secondary | ICD-10-CM | POA: Diagnosis not present

## 2021-12-23 DIAGNOSIS — R338 Other retention of urine: Secondary | ICD-10-CM | POA: Diagnosis not present

## 2021-12-23 DIAGNOSIS — L89626 Pressure-induced deep tissue damage of left heel: Secondary | ICD-10-CM | POA: Diagnosis not present

## 2021-12-23 DIAGNOSIS — Z452 Encounter for adjustment and management of vascular access device: Secondary | ICD-10-CM | POA: Diagnosis not present

## 2021-12-23 DIAGNOSIS — E870 Hyperosmolality and hypernatremia: Secondary | ICD-10-CM | POA: Diagnosis not present

## 2021-12-23 DIAGNOSIS — G894 Chronic pain syndrome: Secondary | ICD-10-CM | POA: Diagnosis not present

## 2021-12-23 DIAGNOSIS — Z7401 Bed confinement status: Secondary | ICD-10-CM | POA: Diagnosis not present

## 2021-12-23 DIAGNOSIS — R1312 Dysphagia, oropharyngeal phase: Secondary | ICD-10-CM | POA: Diagnosis not present

## 2021-12-23 DIAGNOSIS — M797 Fibromyalgia: Secondary | ICD-10-CM | POA: Diagnosis not present

## 2021-12-23 DIAGNOSIS — L89616 Pressure-induced deep tissue damage of right heel: Secondary | ICD-10-CM | POA: Diagnosis not present

## 2021-12-23 DIAGNOSIS — R531 Weakness: Secondary | ICD-10-CM | POA: Diagnosis not present

## 2021-12-23 DIAGNOSIS — Z9049 Acquired absence of other specified parts of digestive tract: Secondary | ICD-10-CM | POA: Diagnosis not present

## 2021-12-23 DIAGNOSIS — R000 Tachycardia, unspecified: Secondary | ICD-10-CM | POA: Diagnosis not present

## 2021-12-23 DIAGNOSIS — L89152 Pressure ulcer of sacral region, stage 2: Secondary | ICD-10-CM | POA: Diagnosis not present

## 2021-12-23 DIAGNOSIS — R404 Transient alteration of awareness: Secondary | ICD-10-CM | POA: Diagnosis not present

## 2021-12-23 DIAGNOSIS — G9341 Metabolic encephalopathy: Secondary | ICD-10-CM | POA: Diagnosis not present

## 2021-12-23 DIAGNOSIS — R262 Difficulty in walking, not elsewhere classified: Secondary | ICD-10-CM | POA: Diagnosis not present

## 2021-12-23 DIAGNOSIS — N189 Chronic kidney disease, unspecified: Secondary | ICD-10-CM | POA: Diagnosis not present

## 2021-12-23 DIAGNOSIS — R197 Diarrhea, unspecified: Secondary | ICD-10-CM | POA: Diagnosis not present

## 2021-12-23 LAB — GLUCOSE, CAPILLARY
Glucose-Capillary: 96 mg/dL (ref 70–99)
Glucose-Capillary: 107 mg/dL — ABNORMAL HIGH (ref 70–99)

## 2021-12-23 LAB — BASIC METABOLIC PANEL
Anion gap: 7 (ref 5–15)
BUN: 52 mg/dL — ABNORMAL HIGH (ref 8–23)
CO2: 25 mmol/L (ref 22–32)
Calcium: 8.5 mg/dL — ABNORMAL LOW (ref 8.9–10.3)
Chloride: 120 mmol/L — ABNORMAL HIGH (ref 98–111)
Creatinine, Ser: 1.58 mg/dL — ABNORMAL HIGH (ref 0.44–1.00)
GFR, Estimated: 34 mL/min — ABNORMAL LOW (ref >60)
Glucose, Bld: 142 mg/dL — ABNORMAL HIGH (ref 70–99)
Potassium: 4.3 mmol/L (ref 3.5–5.1)
Sodium: 152 mmol/L — ABNORMAL HIGH (ref 135–145)

## 2021-12-23 LAB — MAGNESIUM: Magnesium: 2 mg/dL (ref 1.7–2.4)

## 2021-12-23 LAB — PHOSPHORUS: Phosphorus: 3.2 mg/dL (ref 2.5–4.6)

## 2021-12-23 MED ORDER — DEXTROSE 5 % IV SOLN: INTRAVENOUS | Status: DC

## 2021-12-23 MED ORDER — ONDANSETRON HCL 4 MG/2ML IJ SOLN: 4.0000 mg | Freq: Four times a day (QID) | INTRAMUSCULAR | 0 refills | Status: DC | PRN

## 2021-12-23 MED ORDER — SODIUM CHLORIDE 0.9 % IV SOLN
1.0000 g | Freq: Two times a day (BID) | INTRAVENOUS | Status: DC
  Administered 2021-12-23: 1 g via INTRAVENOUS
  Filled 2021-12-23: qty 20

## 2021-12-23 MED ORDER — ZINC SULFATE 220 (50 ZN) MG PO CAPS
220.0000 mg | ORAL_CAPSULE | Freq: Every day | ORAL | Status: DC
Start: 2021-12-23 — End: 2023-04-19

## 2021-12-23 MED ORDER — PRAVASTATIN SODIUM 80 MG PO TABS: 80.0000 mg | ORAL_TABLET | Freq: Every day | ORAL | Status: DC

## 2021-12-23 MED ORDER — METOLAZONE 5 MG PO TABS: 5.0000 mg | ORAL_TABLET | ORAL | Status: DC

## 2021-12-23 MED ORDER — POTASSIUM CHLORIDE ER 10 MEQ PO TBCR: 20.0000 meq | EXTENDED_RELEASE_TABLET | Freq: Every day | ORAL | 0 refills | Status: DC

## 2021-12-23 MED ORDER — LEFLUNOMIDE 10 MG PO TABS: 10.0000 mg | ORAL_TABLET | Freq: Every day | ORAL | Status: DC

## 2021-12-23 MED ORDER — LORAZEPAM 2 MG/ML IJ SOLN: 0.5000 mg | Freq: Four times a day (QID) | INTRAMUSCULAR | Status: DC | PRN

## 2021-12-23 MED ORDER — OSMOLITE 1.5 CAL PO LIQD: 1000.0000 mL | ORAL | Status: DC

## 2021-12-23 MED ORDER — TORSEMIDE 40 MG PO TABS: 40.0000 mg | ORAL_TABLET | Freq: Two times a day (BID) | ORAL | Status: DC

## 2021-12-23 MED ORDER — DEXTROSE 5 % IV SOLN: 75.0000 mL/h | INTRAVENOUS | Status: DC

## 2021-12-23 MED ORDER — ENOXAPARIN SODIUM 30 MG/0.3ML IJ SOSY: 30.0000 mg | PREFILLED_SYRINGE | INTRAMUSCULAR | Status: DC

## 2021-12-23 MED ORDER — SODIUM CHLORIDE 0.9 % IV SOLN: 500.0000 mg | Freq: Two times a day (BID) | INTRAVENOUS | Status: AC

## 2021-12-23 MED ORDER — ONDANSETRON HCL 4 MG PO TABS: 4.0000 mg | ORAL_TABLET | Freq: Four times a day (QID) | ORAL | 0 refills | Status: DC | PRN

## 2021-12-23 MED ORDER — NUCYNTA ER 200 MG PO TB12: 100.0000 mg | ORAL_TABLET | Freq: Two times a day (BID) | ORAL | Status: DC

## 2021-12-23 MED ORDER — PROSOURCE TF PO LIQD: 45.0000 mL | Freq: Two times a day (BID) | ORAL | Status: DC

## 2021-12-23 MED ORDER — HYDROMORPHONE HCL 1 MG/ML IJ SOLN: 1.0000 mg | INTRAMUSCULAR | Status: DC | PRN

## 2021-12-23 MED ORDER — ASCORBIC ACID 500 MG PO TABS: 500.0000 mg | ORAL_TABLET | Freq: Two times a day (BID) | ORAL | Status: DC

## 2021-12-23 NOTE — Discharge Summary (Signed)
Physician Discharge Summary   Patient: Nicole [name] MRN: [id number] DOB: 06/17/1946  Admit date:     12/16/2021  Discharge date: 12/23/21  Discharge Physician: Barton Dubois   PCP: Jake Samples, PA-C   Recommendations at discharge:  Continue close monitoring of patient's electrolytes with further repletion as she is at risk for refeeding syndrome. Continue to closely follow renal function and further adjust medications dosages as needed. Rehabilitation and physical therapy as per facility protocol. Resume home medications when able to tolerate by mouth, patient's renal function has completely recovered. Continue to follow daily weights and strict I's and O's.  Discharge Diagnoses: Principal Problem:   Acute renal failure (ARF) (HCC) Active Problems:   GERD (gastroesophageal reflux disease)   HTN (hypertension)   Chronic pain syndrome   Polymyalgia rheumatica (HCC)   Morbid obesity with BMI of 50.0-59.9, adult (HCC)   Chronic diastolic HF (heart failure) ([place])   Acute urinary retention   Leg wound, left   UTI (urinary tract infection)   Pressure injury of skin   Encounter for feeding tube placement  Hospital Course: Per HPI: Nicole [name] is a 76 y.o. female with medical history significant of HFpEF, HTN, polymyalgia rheumatica on chronic prednisone, GERN, spinal stenosis. She has been hospitalized twice in the past 3 months for AKI, abdominal pain and psoas abscess. She has been in a SNF for rehab since being discharged in middle of December. Her son gives the history - she was recently placed on antibiotics for a lower leg cellulits stemming from a chronic ulceration. Over the past several days, the patient has had very little oral intake of both food and water.  The doctor was going to place her on Remeron and she got a dose last night.  Today, she started having severe abdominal pain and had some blood work done.  The blood work showed an elevated creatinine and was  transported to the hospital for evaluation.  Here, the patient was found to have bladder retention with approximately 500 mL of urine in the bladder.  She was catheterized after given pain medicine.  12/17/21: Patient was admitted with altered mentation in the setting of UTI as well as acute renal failure with acute urinary retention.  She will require Foley catheter placement today and ongoing IV fluid.  Creatinine slowly downtrending.  12/18/21: Patient continues to have altered mentation, but creatinine levels show signs of improvement in with decent urine output.  She continues to remain on treatment for her UTI.  12/19/21: Patient becomes tearful and is and is otherwise somnolent.  Creatinine levels are gradually improving.  She will require D5 IV fluid to help maintain her blood glucose levels.  Rocephin will be changed to Merrem due to noted ESBL in urine.  12/20/21: Patient continues to remain quite altered, but creatinine levels are gradually improving.  NG tube to be inserted with tube feedings to initiate today.  Continue ongoing treatment with antibiotics for now as ordered and may consider discontinuation in the next 1-2 days.  12/21/21: Patient remains altered, disoriented and intermittently experiencing lethargy and crying spells.  Unsafe to take oral intakes.  Status post NG tube placement under fluoroscopy and initiation of tube feedings.  Continue antibiotics and 1 more day of Diflucan.  Continue IV fluids and follow renal function trend.  Prognosis is guarded.   12/22/21: Overall prognosis remain guarded; patient continued to be obtunded and encephalopathic.  We will continue antibiotic therapy and complete Diflucan treatment today; continue nutrition hydration  and medications through NG tube at this time.  Hopefully discharge to [facility] facility for further care and rehab on 12/23/2021.  Anticipating prolonged recovery pathway (if able to respond)..  12/23/21: Hemodynamically stable, still not  following commands.  Further improvement in patient's renal function appreciated.  Stable magnesium, phosphorus and potassium level.  Patient will be transferred to [facility] facility) for further care and rehabilitation.  Assessment and Plan: Acute renal failure-likely postrenal given urinary retention. Secondary to dehydration and urinary retention, baseline creatinine 0.8-0.9 Creatinine continues slowly improving. Will continue to hydrate with D5W currently (in order to assist with hypernatremia) Continue close monitoring of renal function and BUN, Cr at discharge 1.7 The patient has had previous episodes of AKI, although not quite this severe.  The patient has always responded well to IV fluids in the past. Acute urinary retention Uncertain whether this is secondary to infection, obstruction or neurogenic bladder. Patient failed voiding trials and ultimately required foley cathter placement. Will need outpatient follow up with urology service.  Acute metabolic encephalopathy ongoing secondary to AKI and E Coli ESBL and Candida UTI Budding yeast and rare bacteria on UA. Culture with ESBL E. Coli; antibiotics appropriately adjusted based on sensitivities.  Patient has completed treatment with diflucan and will require 3 more days of Merrem to complete antibiotic therapy. Continue to monitor neurologically for signs of improvement. Placement of NG tube under fluoroscopy, with tube feedings initiation on (12/21/2021) due to ongoing encephalopathic state and unsafe oral intake. Continue supportive care and when appropriate involved SPL for evaluation. TSH, ammonia level and CT head within normal limits. Left lower leg wound ABI with moderate PAD noted with no overt signs of cellulitis.  Will need outpatient vascular follow-up. Continue to follow local wound care. Heart failure with preserved EF Will continue to hold diuretics adjust fluid resuscitation and follow daily weights/strict I's and  O's. Chronic pain with polymyalgia rheumatica Patient on is chronic steroids, which has been continue. Resume home [place] and chronic analgesics when able to tolerate PO's and infection fully treated.  Hypertension: stable currently. Continue use of metoprolol. Holding diuretics in the setting of ARF. GERD: continue PPI Morbid obesity: Lifestyle modification, low calorie diet and portion control recommended. Body mass index is 38.39 kg/m.  Hypernatremia: IV's adjusted to D5W to better control electrolytes. Follow trend and stability. Hypokalemia/hypophosphatemia and hypomagnesemia With concerns for refeeding syndrome Continue close monitoring and further replete as needed.  Consultants: dietitian, renal service curbside. Procedures performed: see below for x-ray reports.  Disposition: Long term care facility Diet recommendation:  Currently n.p.o. receiving medications and nutrition through NG tube.  DISCHARGE MEDICATION: Allergies as of 12/23/2021       Reactions   Oxycontin [oxycodone Hcl] Swelling   Pt tolerates hydromorphone.   Sulfa Antibiotics Other (See Comments)   Sores   Tramadol    "Makes me feel like I am falling"   Penicillins Rash   Tolerated augmentin   Sulfasalazine Other (See Comments)   Sores        Medication List     STOP taking these medications    ALPRAZolam 0.25 MG tablet Commonly known as: XANAX   cyclobenzaprine 5 MG tablet Commonly known as: FLEXERIL   diclofenac Sodium 1 % Gel Commonly known as: VOLTAREN   GAS-X PO   lidocaine 5 % Commonly known as: LIDODERM   melatonin 3 MG Tabs tablet   omeprazole 40 MG capsule Commonly known as: PRILOSEC       TAKE these  medications    acetaminophen 325 MG tablet Commonly known as: TYLENOL Take 2 tablets (650 mg total) by mouth every 6 (six) hours as needed for mild pain or headache (or Fever >/= 101).   ascorbic acid 500 MG tablet Commonly known as: VITAMIN C Take 1 tablet (500 mg  total) by mouth 2 (two) times daily.   calcitRIOL 0.25 MCG capsule Commonly known as: ROCALTROL Take 0.25 mcg by mouth See admin instructions. Take 0.25 every Mon, Wed, Fri.   citalopram 20 MG tablet Commonly known as: CELEXA Take 20 mg by mouth daily.   dextrose 5 % solution Inject 75 mL/hr into the vein continuous.   enoxaparin 30 MG/0.3ML injection Commonly known as: LOVENOX Inject 0.3 mLs (30 mg total) into the skin daily. Inpatient use for DVT prophylaxis (dose has been renally adjusted)   feeding supplement (OSMOLITE 1.5 CAL) Liqd Place 1,000 mLs into feeding tube continuous. What changed:  how much to take how to take this when to take this   feeding supplement (PROSource TF) liquid Place 45 mLs into feeding tube 2 (two) times daily. What changed: You were already taking a medication with the same name, and this prescription was added. Make sure you understand how and when to take each.   HYDROmorphone 1 MG/ML injection Commonly known as: DILAUDID Inject 1 mL (1 mg total) into the vein every 4 (four) hours as needed for moderate pain.   leflunomide 10 MG tablet Commonly known as: ARAVA Take 1 tablet (10 mg total) by mouth daily. Remains on hold until completion of antibiotics and ability to tolerate PO's. What changed: See the new instructions.   LORazepam 2 MG/ML injection Commonly known as: ATIVAN Inject 0.25 mLs (0.5 mg total) into the vein every 6 (six) hours as needed for anxiety.   meropenem 500 mg in sodium chloride 0.9 % 100 mL Inject 500 mg into the vein every 12 (twelve) hours for 3 days.   metolazone 5 MG tablet Commonly known as: ZAROXOLYN Take 1 tablet (5 mg total) by mouth 2 (two) times a week. Resume Tuesday and Fridays; when renal function has fully recovered and patient is able to tolerate PO's. Start taking on: December 26, 2021 What changed: additional instructions   metoprolol succinate 25 MG 24 hr tablet Commonly known as: Toprol XL Take 1  tablet (25 mg total) by mouth daily.   MULTIVITAMIN PO Take 1 tablet by mouth daily. Theratrum complete   Nucynta ER 200 MG Tb12 Generic drug: Tapentadol HCl Take 100 mg by mouth every 12 (twelve) hours. To be resume at adjusted dose when able to tolerate PO's and encephalopathy is resolved. What changed: additional instructions   ondansetron 4 MG tablet Commonly known as: ZOFRAN Take 1 tablet (4 mg total) by mouth every 6 (six) hours as needed for nausea. What changed:  when to take this reasons to take this additional instructions   ondansetron 4 MG/2ML Soln injection Commonly known as: ZOFRAN Inject 2 mLs (4 mg total) into the vein every 6 (six) hours as needed for nausea. What changed: You were already taking a medication with the same name, and this prescription was added. Make sure you understand how and when to take each.   pantoprazole 40 MG tablet Commonly known as: PROTONIX Take 1 tablet (40 mg total) by mouth 2 (two) times daily.   potassium chloride 10 MEQ tablet Commonly known as: KLOR-CON Take 2 tablets (20 mEq total) by mouth daily.   pravastatin 80 MG  tablet Commonly known as: PRAVACHOL Take 1 tablet (80 mg total) by mouth daily. Resume at time of discharge when able to tolerate PO's. What changed: additional instructions   predniSONE 1 MG tablet Commonly known as: DELTASONE Take 9 tablets (9 mg total) by mouth daily with breakfast.   Torsemide 40 MG Tabs Take 40 mg by mouth 2 (two) times daily. Dose to be further adjusted and medication resume when renal function fully recovered and patient able to tolerate PO's. What changed: additional instructions   traZODone 50 MG tablet Commonly known as: DESYREL Take 25 mg by mouth at bedtime.   trolamine salicylate 10 % cream Commonly known as: ASPERCREME Apply 1 application topically as needed for muscle pain.   zinc sulfate 220 (50 Zn) MG capsule Take 1 capsule (220 mg total) by mouth daily.                Discharge Care Instructions  (From admission, onward)           Start     Ordered   12/23/21 0000  Discharge wound care:       Comments: Maintain skin area clean and dry; constant repositioning and preventive barrier measures.   12/23/21 0837             Discharge Exam: Filed Weights   12/20/21 0410 12/21/21 0429 12/23/21 0500  Weight: 94.5 kg 94.9 kg 95.2 kg   General exam: Patient remains obtunded, not following commands and receiving medications/nutrition through NG tube.  Afebrile and otherwise hemodynamically stable. Respiratory system: Clear to auscultation. Respiratory effort normal.  Good saturation on room air; no using accessory muscles. Cardiovascular system: Sinus tachycardia.. No murmurs, rubs, gallops.  Unable to properly assess JVD with body habitus. Gastrointestinal system: Abdomen is obese, nondistended, soft and nontender. No organomegaly or masses felt. Normal bowel sounds heard. Central nervous system: Unable to properly assess with current mentation; no focal deficits seen.  Moving limbs spontaneously, but not following commands. Extremities: No cyanosis or clubbing.  Left upper extremity with PICC line in place; no signs of infection or drainage. Skin: No petechiae. Psychiatry: Judgment and insight unable to be assessed due to current encephalopathic process.    Condition at discharge: Hemodynamically stable.  The results of significant diagnostics from this hospitalization (including imaging, microbiology, ancillary and laboratory) are listed below for reference.   Imaging Studies: CT ABDOMEN PELVIS WO CONTRAST  Result Date: 12/16/2021 CLINICAL DATA:  Abdominal pain, acute, nonlocalized patient poor historian, reporting diffuse abdominal pain, tender over entire abdomen - hx of chronic abd pain EXAM: CT ABDOMEN AND PELVIS WITHOUT CONTRAST TECHNIQUE: Multidetector CT imaging of the abdomen and pelvis was performed following the standard  protocol without IV contrast. RADIATION DOSE REDUCTION: This exam was performed according to the departmental dose-optimization program which includes automated exposure control, adjustment of the mA and/or kV according to patient size and/or use of iterative reconstruction technique. COMPARISON:  10/03/2021 FINDINGS: Lower chest: Bibasilar atelectasis/scarring. Hepatobiliary: No focal liver abnormality is seen. Mild nodularity of the liver surface contour. Status post cholecystectomy. No biliary dilatation. Pancreas: Unremarkable. Spleen: Unremarkable. Adrenals/Urinary Tract: Adrenals and kidneys are unremarkable. Bladder is distended. Stomach/Bowel: Stomach is within normal limits. Bowel is normal in caliber. Mild distal colonic diverticulosis. Normal appendix. Vascular/Lymphatic: Diffuse atherosclerosis.  No enlarged nodes. Reproductive: Stable partially calcified left pelvic mass. Other: No free fluid.  Small fat containing umbilical hernia. Musculoskeletal: Advanced degenerative changes of the spine with prior posterior decompression at lumbar levels and chronic  compression deformities. IMPRESSION: No acute abnormality. Bladder is distended.  Correlate for urinary retention. Stable chronic findings detailed above. Electronically Signed   By: Macy Mis M.D.   On: 12/16/2021 17:00   CT HEAD WO CONTRAST (5MM)  Result Date: 12/20/2021 CLINICAL DATA:  76 year old female with altered mental status. Crying. EXAM: CT HEAD WITHOUT CONTRAST TECHNIQUE: Contiguous axial images were obtained from the base of the skull through the vertex without intravenous contrast. RADIATION DOSE REDUCTION: This exam was performed according to the departmental dose-optimization program which includes automated exposure control, adjustment of the mA and/or kV according to patient size and/or use of iterative reconstruction technique. COMPARISON:  Brain MRI and head CT 07/20/2021. FINDINGS: Brain: Cerebral volume is stable and  within normal limits for age. No midline shift, ventriculomegaly, mass effect, evidence of mass lesion, intracranial hemorrhage or evidence of cortically based acute infarction. Patchy and confluent bilateral cerebral white matter hypodensity is mostly periventricular and stable. Small area of chronic cortical encephalomalacia at the inferior left occipital pole is stable, best seen on series 4, image 62. No cortical encephalomalacia identified. Vascular: Calcified atherosclerosis at the skull base. No suspicious intracranial vascular hyperdensity. Skull: Stable hyperostosis of the calvarium. No acute osseous abnormality identified. Sinuses/Orbits: Mild bubbly opacity in the right sphenoid sinus but otherwise paranasal sinuses and mastoids are stable and well aerated. Other: Visualized orbits and scalp soft tissues are within normal limits. IMPRESSION: 1. No acute intracranial abnormality. 2. Chronic white matter disease. Small chronic infarct in the left PCA territory. Electronically Signed   By: Genevie Ann M.D.   On: 12/20/2021 11:38   US ARTERIAL ABI (SCREENING LOWER EXTREMITY)  Result Date: 12/17/2021 CLINICAL DATA:  Lower extremity ulcers. EXAM: NONINVASIVE PHYSIOLOGIC VASCULAR STUDY OF BILATERAL LOWER EXTREMITIES TECHNIQUE: Evaluation of both lower extremities were performed at rest, including calculation of ankle-brachial indices with single level Doppler, pressure and pulse volume recording. COMPARISON:  None. FINDINGS: Right ABI:  0.8 Left ABI:  0.75 Right Lower Extremity: Monophasic dorsalis pedis waveform. Posterior tibial waveform could not be obtained. Left Lower Extremity: Monophasic dorsalis pedis waveform. Posterior tibial waveform could not be obtained. 0.5-0.79 Moderate PAD IMPRESSION: Evidence of peripheral vascular disease in both lower extremities with resting ankle-brachial indices of 0.8 on the right and 0.75 on the left. Bilateral dorsalis pedis waveforms are monophasic. Posterior tibial  waveforms could not be obtained bilaterally. Electronically Signed   By: Aletta Edouard M.D.   On: 12/17/2021 11:09   DG [place] Dys Tube Plc W/Fl W/Rad  Result Date: 12/21/2021 CLINICAL DATA:  Unable to pass NG tube in ICU EXAM: NASO G TUBE PLACEMENT WITH FL AND WITH RAD CONTRAST:  None FLUOROSCOPY: Fluoroscopy Time:  15 minutes 48 seconds Radiation Exposure Index (if provided by the fluoroscopic device): 494 mGy Number of Acquired Spot Images: 4 COMPARISON:  CT abdomen and pelvis 12/16/2021 FINDINGS: Patient unable to cooperate with positioning, remaining with neck hyper extended and unable to follow commands/swallow. Requested 58 French feeding tube was placed from the LEFT nostril into esophagus after multiple attempts. A guidewire was utilized to stiff in the tube and place tube into the stomach. Unable to utilize a guidewire to pass tube across the pylorus into the duodenum. Tube was LEFT in the stomach. IMPRESSION: Nasogastric tube placed into the stomach under fluoroscopy. Electronically Signed   By: Lavonia Dana M.D.   On: 12/21/2021 12:11   [place] EKG SITE RITE  Result Date: 12/20/2021 If [facility] image not attached, placement could  not be confirmed due to current cardiac rhythm.   Microbiology: Results for orders placed or performed during the hospital encounter of 12/16/21  Resp Panel by RT-PCR (Flu A&B, Covid) Nasopharyngeal Swab     Status: None   Collection Time: 12/16/21  5:23 PM   Specimen: Nasopharyngeal Swab; Nasopharyngeal(NP) swabs in vial transport medium  Result Value Ref Range Status   SARS Coronavirus 2 by RT PCR NEGATIVE NEGATIVE Final    Comment: (NOTE) SARS-CoV-2 target nucleic acids are NOT DETECTED.  The SARS-CoV-2 RNA is generally detectable in upper respiratory specimens during the acute phase of infection. The lowest concentration of SARS-CoV-2 viral copies this assay can detect is 138 copies/mL. A negative result does not preclude SARS-Cov-2 infection and should not  be used as the sole basis for treatment or other patient management decisions. A negative result may occur with  improper specimen collection/handling, submission of specimen other than nasopharyngeal swab, presence of viral mutation(s) within the areas targeted by this assay, and inadequate number of viral copies(<138 copies/mL). A negative result must be combined with clinical observations, patient history, and epidemiological information. The expected result is Negative.  Fact Sheet for Patients:  EntrepreneurPulse.com.au  Fact Sheet for Healthcare Providers:  IncredibleEmployment.be  This test is no t yet approved or cleared by the [place] FDA and  has been authorized for detection and/or diagnosis of SARS-CoV-2 by FDA under an Emergency Use Authorization (EUA). This EUA will remain  in effect (meaning this test can be used) for the duration of the COVID-19 declaration under Section 564(b)(1) of the Act, 21 U.S.C.section 360bbb-3(b)(1), unless the authorization is terminated  or revoked sooner.       Influenza A by PCR NEGATIVE NEGATIVE Final   Influenza B by PCR NEGATIVE NEGATIVE Final    Comment: (NOTE) The Xpert Xpress SARS-CoV-2/FLU/RSV plus assay is intended as an aid in the diagnosis of influenza from Nasopharyngeal swab specimens and should not be used as a sole basis for treatment. Nasal washings and aspirates are unacceptable for Xpert Xpress SARS-CoV-2/FLU/RSV testing.  Fact Sheet for Patients: EntrepreneurPulse.com.au  Fact Sheet for Healthcare Providers: IncredibleEmployment.be  This test is not yet approved or cleared by the [place] FDA and has been authorized for detection and/or diagnosis of SARS-CoV-2 by FDA under an Emergency Use Authorization (EUA). This EUA will remain in effect (meaning this test can be used) for the duration of the COVID-19 declaration under Section  564(b)(1) of the Act, 21 U.S.C. section 360bbb-3(b)(1), unless the authorization is terminated or revoked.  Performed at [facility], [address], [place], [place] [address]   Urine Culture     Status: Abnormal   Collection Time: 12/16/21  5:46 PM   Specimen: Urine, Catheterized  Result Value Ref Range Status   Specimen Description   Final    URINE, CATHETERIZED Performed at [facility], [address], [place], [place] [address]    Special Requests   Final    NONE Performed at [facility], [address] [place], [place] [address]    Culture (A)  Final    3,000 COLONIES/mL ESCHERICHIA COLI Confirmed Extended Spectrum Beta-Lactamase Producer (ESBL).  In bloodstream infections from ESBL organisms, carbapenems are preferred over piperacillin/tazobactam. They are shown to have a lower risk of mortality.    Report Status 12/19/2021 FINAL  Final   Organism ID, Bacteria ESCHERICHIA COLI (A)  Final      Susceptibility   Escherichia coli - MIC*    AMPICILLIN >=32  RESISTANT Resistant     CEFAZOLIN >=64 RESISTANT Resistant     CEFEPIME >=32 RESISTANT Resistant     CEFTRIAXONE >=64 RESISTANT Resistant     CIPROFLOXACIN >=4 RESISTANT Resistant     GENTAMICIN >=16 RESISTANT Resistant     IMIPENEM <=0.25 SENSITIVE Sensitive     NITROFURANTOIN 128 RESISTANT Resistant     TRIMETH/SULFA <=20 SENSITIVE Sensitive     AMPICILLIN/SULBACTAM >=32 RESISTANT Resistant     PIP/TAZO 8 SENSITIVE Sensitive     * 3,000 COLONIES/mL ESCHERICHIA COLI  MRSA Next Gen by PCR, Nasal     Status: None   Collection Time: 12/16/21  8:12 PM   Specimen: Nasal Mucosa; Nasal Swab  Result Value Ref Range Status   MRSA by PCR Next Gen NOT DETECTED NOT DETECTED Final    Comment: (NOTE) The GeneXpert MRSA Assay (FDA approved for NASAL specimens only), is one component of a comprehensive MRSA colonization surveillance program. It is not intended to diagnose MRSA infection nor to guide or monitor treatment for MRSA  infections. Test performance is not FDA approved in patients less than 69 years old. Performed at [facility], [address], [place], [place] [address]     Labs: CBC: Recent Labs  Lab 12/16/21 1621 12/17/21 0500 12/18/21 0418 12/19/21 0652 12/21/21 0349  WBC 10.8* 11.1* 9.6 9.2 6.9  NEUTROABS 9.1*  --   --   --   --   HGB 13.7 12.4 10.9* 11.6* 10.7*  HCT 39.7 36.7 33.3* 36.9 33.0*  MCV 90.4 92.7 93.5 96.1 95.7  PLT 290 240 191 182 335   Basic Metabolic Panel: Recent Labs  Lab 12/16/21 1621 12/16/21 2119 12/18/21 0418 12/19/21 0652 12/20/21 0427 12/21/21 0349 12/22/21 0939 12/22/21 1650 12/23/21 0435  NA 136   < > 142 145 148* 150*  --   --  152*  K 3.8   < > 2.6* 2.9* 2.5* 2.7*  --   --  4.3  CL 93*   < > 103 106 112* 115*  --   --  120*  CO2 25   < > 25 23 23 25   --   --  25  GLUCOSE 105*   < > 70 63* 110* 135*  --   --  142*  BUN 158*   < > 113* 118* 89* 74*  --   --  52*  CREATININE 5.98*   < > 4.75* 3.74* 2.63* 2.26*  --   --  1.58*  CALCIUM 8.9   < > 8.8* 8.9 8.8* 8.8*  --   --  8.5*  MG 2.4  --  2.2 2.1 2.2 2.1 2.1 2.3 2.0  PHOS 5.4*  --   --   --   --   --  1.9* 1.3* 3.2   < > = values in this interval not displayed.   CBG: Recent Labs  Lab 12/22/21 2027 12/23/21 0830  GLUCAP 116* 96    Discharge time spent: greater than 30 minutes.  Signed: Barton Dubois, MD [facility] 12/23/2021

## 2021-12-23 NOTE — Progress Notes (Signed)
Pharmacy Antibiotic Note ? ?Nicole [name] is a 76 y.o. female admitted on 12/16/2021 with UTI.  Pharmacy has been consulted for meropenem dosing. ?Scr improved, Crcl 61mls/min ? ?Plan: ?Increase Meropenem 1000 mg IV every 12 hours. X 3 more days per MD  ?Monitor renal function closely ?F/u Ucx ?Monitor labs, c/s, and patient improvement. ? ?Height: 5\' 2"  (157.5 cm) ?Weight: 95.2 kg (209 lb 14.1 oz) ?IBW/kg (Calculated) : 50.1 ? ?Temp (24hrs), Avg:98.8 ?F (37.1 ?C), Min:98.5 ?F (36.9 ?C), Max:99 ?F (37.2 ?C) ? ?Recent Labs  ?Lab 12/16/21 ?1621 12/16/21 ?2119 12/17/21 ?0500 12/18/21 ?0418 12/19/21 ?3846 12/20/21 ?0427 12/21/21 ?6599 12/23/21 ?3570  ?WBC 10.8*  --  11.1* 9.6 9.2  --  6.9  --   ?CREATININE 5.98*   < > 5.39* 4.75* 3.74* 2.63* 2.26* 1.58*  ? < > = values in this interval not displayed.  ? ?  ?Estimated Creatinine Clearance: 33.1 mL/min (A) (by C-G formula based on SCr of 1.58 mg/dL (H)).   ? ?Allergies  ?Allergen Reactions  ? Oxycontin [Oxycodone Hcl] Swelling  ?  Pt tolerates hydromorphone.  ? Sulfa Antibiotics Other (See Comments)  ?  Sores  ? Tramadol   ?  "Makes me feel like I am falling"  ? Penicillins Rash  ?  Tolerated augmentin ?  ? Sulfasalazine Other (See Comments)  ?  Sores  ? ? ?Antimicrobials this admission: ?CTX 2/24 >>2/28 ?Fluconazole 2/24 >> 3/2 ?Merrem 2/28>> ? ?Microbiology results: ?2/24 Ucx: 3000 ESBL e. Coli ?MRSA PCR: neg ? ?Thank you for allowing pharmacy to be a part of this patient?s care. ? ?Cristy Friedlander ?12/23/2021 9:24 AM ? ?

## 2021-12-23 NOTE — TOC Transition Note (Signed)
Transition of Care (TOC) - CM/SW Discharge Note ? ? ?Patient Details  ?Name: Nicole [name] ?MRN: [id number] ?Date of Birth: Jul 03, 1946 ? ?Transition of Care (TOC) CM/SW Contact:  ?Boneta Lucks, RN ?Phone Number: ?12/23/2021, 11:11 AM ? ? ?Clinical Narrative:   [facility] is ready for patient to admit today. They requested 2PM arrival.  TOC scheduled EMS. Jaquelyn Bitter is contacting her son to review discharge plan and time of transport. RN updated. DC summary completed.  ? ? ?Final next level of care: [facility] (LTAC) ?Barriers to Discharge: Barriers Resolved ? ? ?Patient Goals and CMS Choice ?Patient states their goals for this hospitalization and ongoing recovery are:: agreeable to LTAC ?CMS Medicare.gov Compare Post Acute Care list provided to:: Patient ?  ?Discharge Placement ?       ?  ?Patient to be transferred to facility by: EMS ?  ?Patient and family notified of of transfer: 12/23/21 ? ?Discharge Plan and Services ?  ?  ?Readmission Risk Interventions ?Readmission Risk Prevention Plan 12/23/2021 10/04/2021 09/26/2021  ?Transportation Screening Complete Complete Complete  ?[place] or Home Care Consult - Complete Complete  ?Social Work Consult for [place] Planning/Counseling - Complete Complete  ?Palliative Care Screening - Not Applicable Not Applicable  ?Medication Review Press photographer) Complete Complete Complete  ?[place] or Home Care Consult Complete - -  ?SW Recovery Care/Counseling Consult Complete - -  ?Palliative Care Screening Not Applicable - -  ?[place] Not Applicable - -  ?Some recent data might be hidden  ? ? ? ? ? ?

## 2021-12-23 NOTE — Progress Notes (Signed)
Called [facility] and gave report. [facility] arrived and transported patient. IV fluids were stopped for transport and foley and NG tube was left in. Left PICC was also left in.  ?

## 2021-12-24 DIAGNOSIS — N179 Acute kidney failure, unspecified: Secondary | ICD-10-CM | POA: Diagnosis not present

## 2021-12-24 DIAGNOSIS — I5033 Acute on chronic diastolic (congestive) heart failure: Secondary | ICD-10-CM | POA: Diagnosis not present

## 2021-12-24 DIAGNOSIS — G9341 Metabolic encephalopathy: Secondary | ICD-10-CM | POA: Diagnosis not present

## 2021-12-24 DIAGNOSIS — I110 Hypertensive heart disease with heart failure: Secondary | ICD-10-CM | POA: Diagnosis not present

## 2021-12-24 LAB — CBC WITH DIFFERENTIAL/PLATELET
Abs Immature Granulocytes: 0.07 10*3/uL (ref 0.00–0.07)
Basophils Absolute: 0 10*3/uL (ref 0.0–0.1)
Basophils Relative: 0 %
Eosinophils Absolute: 0.1 10*3/uL (ref 0.0–0.5)
Eosinophils Relative: 1 %
HCT: 32.9 % — ABNORMAL LOW (ref 36.0–46.0)
Hemoglobin: 10.4 g/dL — ABNORMAL LOW (ref 12.0–15.0)
Immature Granulocytes: 1 %
Lymphocytes Relative: 14 %
Lymphs Abs: 1.3 10*3/uL (ref 0.7–4.0)
MCH: 30 pg (ref 26.0–34.0)
MCHC: 31.6 g/dL (ref 30.0–36.0)
MCV: 94.8 fL (ref 80.0–100.0)
Monocytes Absolute: 0.8 10*3/uL (ref 0.1–1.0)
Monocytes Relative: 9 %
Neutro Abs: 7 10*3/uL (ref 1.7–7.7)
Neutrophils Relative %: 75 %
Platelets: 123 10*3/uL — ABNORMAL LOW (ref 150–400)
RBC: 3.47 MIL/uL — ABNORMAL LOW (ref 3.87–5.11)
RDW: 15.6 % — ABNORMAL HIGH (ref 11.5–15.5)
WBC: 9.2 10*3/uL (ref 4.0–10.5)
nRBC: 0 % (ref 0.0–0.2)

## 2021-12-24 LAB — COMPREHENSIVE METABOLIC PANEL
ALT: 22 U/L (ref 0–44)
AST: 33 U/L (ref 15–41)
Albumin: <1.5 g/dL — ABNORMAL LOW (ref 3.5–5.0)
Alkaline Phosphatase: 145 U/L — ABNORMAL HIGH (ref 38–126)
Anion gap: 14 (ref 5–15)
BUN: 42 mg/dL — ABNORMAL HIGH (ref 8–23)
CO2: 19 mmol/L — ABNORMAL LOW (ref 22–32)
Calcium: 8.3 mg/dL — ABNORMAL LOW (ref 8.9–10.3)
Chloride: 117 mmol/L — ABNORMAL HIGH (ref 98–111)
Creatinine, Ser: 1.45 mg/dL — ABNORMAL HIGH (ref 0.44–1.00)
GFR, Estimated: 38 mL/min — ABNORMAL LOW (ref >60)
Glucose, Bld: 101 mg/dL — ABNORMAL HIGH (ref 70–99)
Potassium: 4.5 mmol/L (ref 3.5–5.1)
Sodium: 150 mmol/L — ABNORMAL HIGH (ref 135–145)
Total Bilirubin: 0.3 mg/dL (ref 0.3–1.2)
Total Protein: 4.4 g/dL — ABNORMAL LOW (ref 6.5–8.1)

## 2021-12-24 LAB — TSH: TSH: 1.651 u[IU]/mL (ref 0.350–4.500)

## 2021-12-25 ENCOUNTER — Other Ambulatory Visit (HOSPITAL_COMMUNITY): Payer: Self-pay

## 2021-12-25 LAB — C DIFFICILE QUICK SCREEN W PCR REFLEX
C Diff antigen: NEGATIVE
C Diff interpretation: NOT DETECTED
C Diff toxin: NEGATIVE

## 2021-12-26 DIAGNOSIS — N179 Acute kidney failure, unspecified: Secondary | ICD-10-CM | POA: Diagnosis not present

## 2021-12-26 DIAGNOSIS — G9341 Metabolic encephalopathy: Secondary | ICD-10-CM | POA: Diagnosis not present

## 2021-12-26 DIAGNOSIS — E870 Hyperosmolality and hypernatremia: Secondary | ICD-10-CM | POA: Diagnosis not present

## 2021-12-26 LAB — CBC
HCT: 33.6 % — ABNORMAL LOW (ref 36.0–46.0)
Hemoglobin: 10.9 g/dL — ABNORMAL LOW (ref 12.0–15.0)
MCH: 30.1 pg (ref 26.0–34.0)
MCHC: 32.4 g/dL (ref 30.0–36.0)
MCV: 92.8 fL (ref 80.0–100.0)
Platelets: 164 10*3/uL (ref 150–400)
RBC: 3.62 MIL/uL — ABNORMAL LOW (ref 3.87–5.11)
RDW: 15 % (ref 11.5–15.5)
WBC: 8.6 10*3/uL (ref 4.0–10.5)
nRBC: 0 % (ref 0.0–0.2)

## 2021-12-26 LAB — MAGNESIUM: Magnesium: 1.7 mg/dL (ref 1.7–2.4)

## 2021-12-26 LAB — BASIC METABOLIC PANEL
Anion gap: 12 (ref 5–15)
BUN: 39 mg/dL — ABNORMAL HIGH (ref 8–23)
CO2: 26 mmol/L (ref 22–32)
Calcium: 8.4 mg/dL — ABNORMAL LOW (ref 8.9–10.3)
Chloride: 107 mmol/L (ref 98–111)
Creatinine, Ser: 1.54 mg/dL — ABNORMAL HIGH (ref 0.44–1.00)
GFR, Estimated: 35 mL/min — ABNORMAL LOW (ref >60)
Glucose, Bld: 97 mg/dL (ref 70–99)
Potassium: 4.5 mmol/L (ref 3.5–5.1)
Sodium: 145 mmol/L (ref 135–145)

## 2021-12-27 DIAGNOSIS — R1310 Dysphagia, unspecified: Secondary | ICD-10-CM | POA: Diagnosis not present

## 2021-12-27 DIAGNOSIS — I5033 Acute on chronic diastolic (congestive) heart failure: Secondary | ICD-10-CM | POA: Diagnosis not present

## 2021-12-27 DIAGNOSIS — N179 Acute kidney failure, unspecified: Secondary | ICD-10-CM | POA: Diagnosis not present

## 2021-12-27 DIAGNOSIS — G9341 Metabolic encephalopathy: Secondary | ICD-10-CM | POA: Diagnosis not present

## 2021-12-27 LAB — RENAL FUNCTION PANEL
Albumin: 1.5 g/dL — ABNORMAL LOW (ref 3.5–5.0)
Anion gap: 14 (ref 5–15)
BUN: 50 mg/dL — ABNORMAL HIGH (ref 8–23)
CO2: 25 mmol/L (ref 22–32)
Calcium: 8 mg/dL — ABNORMAL LOW (ref 8.9–10.3)
Chloride: 108 mmol/L (ref 98–111)
Creatinine, Ser: 1.6 mg/dL — ABNORMAL HIGH (ref 0.44–1.00)
GFR, Estimated: 33 mL/min — ABNORMAL LOW (ref >60)
Glucose, Bld: 106 mg/dL — ABNORMAL HIGH (ref 70–99)
Phosphorus: 3.2 mg/dL (ref 2.5–4.6)
Potassium: 4.2 mmol/L (ref 3.5–5.1)
Sodium: 147 mmol/L — ABNORMAL HIGH (ref 135–145)

## 2021-12-28 DIAGNOSIS — N179 Acute kidney failure, unspecified: Secondary | ICD-10-CM | POA: Diagnosis not present

## 2021-12-28 DIAGNOSIS — R1310 Dysphagia, unspecified: Secondary | ICD-10-CM | POA: Diagnosis not present

## 2021-12-28 DIAGNOSIS — G9341 Metabolic encephalopathy: Secondary | ICD-10-CM | POA: Diagnosis not present

## 2021-12-28 DIAGNOSIS — I5033 Acute on chronic diastolic (congestive) heart failure: Secondary | ICD-10-CM | POA: Diagnosis not present

## 2021-12-28 LAB — MAGNESIUM: Magnesium: 2.3 mg/dL (ref 1.7–2.4)

## 2021-12-29 DIAGNOSIS — R1310 Dysphagia, unspecified: Secondary | ICD-10-CM | POA: Diagnosis not present

## 2021-12-29 DIAGNOSIS — G9341 Metabolic encephalopathy: Secondary | ICD-10-CM | POA: Diagnosis not present

## 2021-12-29 DIAGNOSIS — N179 Acute kidney failure, unspecified: Secondary | ICD-10-CM | POA: Diagnosis not present

## 2021-12-29 LAB — RENAL FUNCTION PANEL
Albumin: 2 g/dL — ABNORMAL LOW (ref 3.5–5.0)
Anion gap: 8 (ref 5–15)
BUN: 53 mg/dL — ABNORMAL HIGH (ref 8–23)
CO2: 26 mmol/L (ref 22–32)
Calcium: 8 mg/dL — ABNORMAL LOW (ref 8.9–10.3)
Chloride: 107 mmol/L (ref 98–111)
Creatinine, Ser: 1.23 mg/dL — ABNORMAL HIGH (ref 0.44–1.00)
GFR, Estimated: 46 mL/min — ABNORMAL LOW (ref >60)
Glucose, Bld: 86 mg/dL (ref 70–99)
Phosphorus: 2 mg/dL — ABNORMAL LOW (ref 2.5–4.6)
Potassium: 3.7 mmol/L (ref 3.5–5.1)
Sodium: 141 mmol/L (ref 135–145)

## 2021-12-29 LAB — CBC
HCT: 26.7 % — ABNORMAL LOW (ref 36.0–46.0)
Hemoglobin: 8.7 g/dL — ABNORMAL LOW (ref 12.0–15.0)
MCH: 30.3 pg (ref 26.0–34.0)
MCHC: 32.6 g/dL (ref 30.0–36.0)
MCV: 93 fL (ref 80.0–100.0)
Platelets: 167 10*3/uL (ref 150–400)
RBC: 2.87 MIL/uL — ABNORMAL LOW (ref 3.87–5.11)
RDW: 15 % (ref 11.5–15.5)
WBC: 6.4 10*3/uL (ref 4.0–10.5)
nRBC: 0 % (ref 0.0–0.2)

## 2021-12-30 ENCOUNTER — Other Ambulatory Visit (HOSPITAL_COMMUNITY): Payer: Self-pay

## 2021-12-30 DIAGNOSIS — G9341 Metabolic encephalopathy: Secondary | ICD-10-CM | POA: Diagnosis not present

## 2021-12-30 DIAGNOSIS — I5030 Unspecified diastolic (congestive) heart failure: Secondary | ICD-10-CM | POA: Diagnosis not present

## 2021-12-30 DIAGNOSIS — N179 Acute kidney failure, unspecified: Secondary | ICD-10-CM | POA: Diagnosis not present

## 2021-12-31 DIAGNOSIS — R1310 Dysphagia, unspecified: Secondary | ICD-10-CM | POA: Diagnosis not present

## 2021-12-31 DIAGNOSIS — N179 Acute kidney failure, unspecified: Secondary | ICD-10-CM | POA: Diagnosis not present

## 2021-12-31 DIAGNOSIS — G9341 Metabolic encephalopathy: Secondary | ICD-10-CM | POA: Diagnosis not present

## 2022-01-01 DIAGNOSIS — G9341 Metabolic encephalopathy: Secondary | ICD-10-CM | POA: Diagnosis not present

## 2022-01-01 DIAGNOSIS — N179 Acute kidney failure, unspecified: Secondary | ICD-10-CM | POA: Diagnosis not present

## 2022-01-01 DIAGNOSIS — R1310 Dysphagia, unspecified: Secondary | ICD-10-CM | POA: Diagnosis not present

## 2022-01-01 LAB — CBC
HCT: 27.3 % — ABNORMAL LOW (ref 36.0–46.0)
Hemoglobin: 8.7 g/dL — ABNORMAL LOW (ref 12.0–15.0)
MCH: 30.5 pg (ref 26.0–34.0)
MCHC: 31.9 g/dL (ref 30.0–36.0)
MCV: 95.8 fL (ref 80.0–100.0)
Platelets: 197 10*3/uL (ref 150–400)
RBC: 2.85 MIL/uL — ABNORMAL LOW (ref 3.87–5.11)
RDW: 15.3 % (ref 11.5–15.5)
WBC: 6.2 10*3/uL (ref 4.0–10.5)
nRBC: 0 % (ref 0.0–0.2)

## 2022-01-01 LAB — RENAL FUNCTION PANEL
Albumin: 2.2 g/dL — ABNORMAL LOW (ref 3.5–5.0)
Anion gap: 8 (ref 5–15)
BUN: 27 mg/dL — ABNORMAL HIGH (ref 8–23)
CO2: 25 mmol/L (ref 22–32)
Calcium: 8.1 mg/dL — ABNORMAL LOW (ref 8.9–10.3)
Chloride: 104 mmol/L (ref 98–111)
Creatinine, Ser: 1.17 mg/dL — ABNORMAL HIGH (ref 0.44–1.00)
GFR, Estimated: 49 mL/min — ABNORMAL LOW (ref >60)
Glucose, Bld: 75 mg/dL (ref 70–99)
Phosphorus: 2.9 mg/dL (ref 2.5–4.6)
Potassium: 4 mmol/L (ref 3.5–5.1)
Sodium: 137 mmol/L (ref 135–145)

## 2022-01-01 LAB — MAGNESIUM: Magnesium: 2.1 mg/dL (ref 1.7–2.4)

## 2022-01-02 DIAGNOSIS — I5030 Unspecified diastolic (congestive) heart failure: Secondary | ICD-10-CM | POA: Diagnosis not present

## 2022-01-02 DIAGNOSIS — G9341 Metabolic encephalopathy: Secondary | ICD-10-CM | POA: Diagnosis not present

## 2022-01-02 DIAGNOSIS — N179 Acute kidney failure, unspecified: Secondary | ICD-10-CM | POA: Diagnosis not present

## 2022-01-02 DIAGNOSIS — I739 Peripheral vascular disease, unspecified: Secondary | ICD-10-CM | POA: Diagnosis not present

## 2022-01-03 DIAGNOSIS — I739 Peripheral vascular disease, unspecified: Secondary | ICD-10-CM | POA: Diagnosis not present

## 2022-01-03 DIAGNOSIS — N179 Acute kidney failure, unspecified: Secondary | ICD-10-CM | POA: Diagnosis not present

## 2022-01-03 DIAGNOSIS — G9341 Metabolic encephalopathy: Secondary | ICD-10-CM | POA: Diagnosis not present

## 2022-01-03 DIAGNOSIS — I5030 Unspecified diastolic (congestive) heart failure: Secondary | ICD-10-CM | POA: Diagnosis not present

## 2022-01-04 DIAGNOSIS — N179 Acute kidney failure, unspecified: Secondary | ICD-10-CM | POA: Diagnosis not present

## 2022-01-04 DIAGNOSIS — R1310 Dysphagia, unspecified: Secondary | ICD-10-CM | POA: Diagnosis not present

## 2022-01-04 DIAGNOSIS — G9341 Metabolic encephalopathy: Secondary | ICD-10-CM | POA: Diagnosis not present

## 2022-01-04 DIAGNOSIS — I5033 Acute on chronic diastolic (congestive) heart failure: Secondary | ICD-10-CM | POA: Diagnosis not present

## 2022-01-04 LAB — CBC
HCT: 27.7 % — ABNORMAL LOW (ref 36.0–46.0)
Hemoglobin: 9.3 g/dL — ABNORMAL LOW (ref 12.0–15.0)
MCH: 31 pg (ref 26.0–34.0)
MCHC: 33.6 g/dL (ref 30.0–36.0)
MCV: 92.3 fL (ref 80.0–100.0)
Platelets: 236 10*3/uL (ref 150–400)
RBC: 3 MIL/uL — ABNORMAL LOW (ref 3.87–5.11)
RDW: 15.3 % (ref 11.5–15.5)
WBC: 8.6 10*3/uL (ref 4.0–10.5)
nRBC: 0 % (ref 0.0–0.2)

## 2022-01-04 LAB — BASIC METABOLIC PANEL
Anion gap: 9 (ref 5–15)
BUN: 15 mg/dL (ref 8–23)
CO2: 21 mmol/L — ABNORMAL LOW (ref 22–32)
Calcium: 7.9 mg/dL — ABNORMAL LOW (ref 8.9–10.3)
Chloride: 106 mmol/L (ref 98–111)
Creatinine, Ser: 1.11 mg/dL — ABNORMAL HIGH (ref 0.44–1.00)
GFR, Estimated: 52 mL/min — ABNORMAL LOW (ref >60)
Glucose, Bld: 64 mg/dL — ABNORMAL LOW (ref 70–99)
Potassium: 4.5 mmol/L (ref 3.5–5.1)
Sodium: 136 mmol/L (ref 135–145)

## 2022-01-04 LAB — MAGNESIUM: Magnesium: 1.6 mg/dL — ABNORMAL LOW (ref 1.7–2.4)

## 2022-01-05 ENCOUNTER — Other Ambulatory Visit (HOSPITAL_COMMUNITY): Payer: Self-pay

## 2022-01-05 DIAGNOSIS — I110 Hypertensive heart disease with heart failure: Secondary | ICD-10-CM | POA: Diagnosis not present

## 2022-01-05 DIAGNOSIS — I5030 Unspecified diastolic (congestive) heart failure: Secondary | ICD-10-CM | POA: Diagnosis not present

## 2022-01-05 DIAGNOSIS — N179 Acute kidney failure, unspecified: Secondary | ICD-10-CM | POA: Diagnosis not present

## 2022-01-05 DIAGNOSIS — G9341 Metabolic encephalopathy: Secondary | ICD-10-CM | POA: Diagnosis not present

## 2022-01-05 LAB — MAGNESIUM: Magnesium: 2 mg/dL (ref 1.7–2.4)

## 2022-01-08 DIAGNOSIS — N179 Acute kidney failure, unspecified: Secondary | ICD-10-CM | POA: Diagnosis not present

## 2022-01-08 DIAGNOSIS — G9341 Metabolic encephalopathy: Secondary | ICD-10-CM | POA: Diagnosis not present

## 2022-01-08 DIAGNOSIS — R1310 Dysphagia, unspecified: Secondary | ICD-10-CM | POA: Diagnosis not present

## 2022-01-09 DIAGNOSIS — G9341 Metabolic encephalopathy: Secondary | ICD-10-CM | POA: Diagnosis not present

## 2022-01-09 DIAGNOSIS — I5030 Unspecified diastolic (congestive) heart failure: Secondary | ICD-10-CM | POA: Diagnosis not present

## 2022-01-09 DIAGNOSIS — N179 Acute kidney failure, unspecified: Secondary | ICD-10-CM | POA: Diagnosis not present

## 2022-01-11 DIAGNOSIS — G9341 Metabolic encephalopathy: Secondary | ICD-10-CM | POA: Diagnosis not present

## 2022-01-11 DIAGNOSIS — N179 Acute kidney failure, unspecified: Secondary | ICD-10-CM | POA: Diagnosis not present

## 2022-01-11 DIAGNOSIS — I5030 Unspecified diastolic (congestive) heart failure: Secondary | ICD-10-CM | POA: Diagnosis not present

## 2022-01-11 LAB — CBC
HCT: 26.5 % — ABNORMAL LOW (ref 36.0–46.0)
Hemoglobin: 8.5 g/dL — ABNORMAL LOW (ref 12.0–15.0)
MCH: 30.6 pg (ref 26.0–34.0)
MCHC: 32.1 g/dL (ref 30.0–36.0)
MCV: 95.3 fL (ref 80.0–100.0)
Platelets: 203 10*3/uL (ref 150–400)
RBC: 2.78 MIL/uL — ABNORMAL LOW (ref 3.87–5.11)
RDW: 16.6 % — ABNORMAL HIGH (ref 11.5–15.5)
WBC: 7.5 10*3/uL (ref 4.0–10.5)
nRBC: 0.4 % — ABNORMAL HIGH (ref 0.0–0.2)

## 2022-01-11 LAB — COMPREHENSIVE METABOLIC PANEL
ALT: 22 U/L (ref 0–44)
AST: 17 U/L (ref 15–41)
Albumin: 2.1 g/dL — ABNORMAL LOW (ref 3.5–5.0)
Alkaline Phosphatase: 84 U/L (ref 38–126)
Anion gap: 9 (ref 5–15)
BUN: 30 mg/dL — ABNORMAL HIGH (ref 8–23)
CO2: 28 mmol/L (ref 22–32)
Calcium: 8.3 mg/dL — ABNORMAL LOW (ref 8.9–10.3)
Chloride: 102 mmol/L (ref 98–111)
Creatinine, Ser: 1.27 mg/dL — ABNORMAL HIGH (ref 0.44–1.00)
GFR, Estimated: 44 mL/min — ABNORMAL LOW (ref >60)
Glucose, Bld: 79 mg/dL (ref 70–99)
Potassium: 3.7 mmol/L (ref 3.5–5.1)
Sodium: 139 mmol/L (ref 135–145)
Total Bilirubin: 0.5 mg/dL (ref 0.3–1.2)
Total Protein: 4.8 g/dL — ABNORMAL LOW (ref 6.5–8.1)

## 2022-01-11 LAB — MAGNESIUM: Magnesium: 1.5 mg/dL — ABNORMAL LOW (ref 1.7–2.4)

## 2022-01-11 LAB — VITAMIN D 25 HYDROXY (VIT D DEFICIENCY, FRACTURES): Vit D, 25-Hydroxy: 61.79 ng/mL (ref 30–100)

## 2022-01-12 DIAGNOSIS — N179 Acute kidney failure, unspecified: Secondary | ICD-10-CM | POA: Diagnosis not present

## 2022-01-12 DIAGNOSIS — I5030 Unspecified diastolic (congestive) heart failure: Secondary | ICD-10-CM | POA: Diagnosis not present

## 2022-01-12 DIAGNOSIS — G9341 Metabolic encephalopathy: Secondary | ICD-10-CM | POA: Diagnosis not present

## 2022-01-12 LAB — MAGNESIUM: Magnesium: 2 mg/dL (ref 1.7–2.4)

## 2022-01-13 DIAGNOSIS — N179 Acute kidney failure, unspecified: Secondary | ICD-10-CM | POA: Diagnosis not present

## 2022-01-13 DIAGNOSIS — I5030 Unspecified diastolic (congestive) heart failure: Secondary | ICD-10-CM | POA: Diagnosis not present

## 2022-01-13 DIAGNOSIS — G9341 Metabolic encephalopathy: Secondary | ICD-10-CM | POA: Diagnosis not present

## 2022-01-13 DIAGNOSIS — R1310 Dysphagia, unspecified: Secondary | ICD-10-CM | POA: Diagnosis not present

## 2022-01-14 DIAGNOSIS — G9341 Metabolic encephalopathy: Secondary | ICD-10-CM | POA: Diagnosis not present

## 2022-01-14 DIAGNOSIS — R1310 Dysphagia, unspecified: Secondary | ICD-10-CM | POA: Diagnosis not present

## 2022-01-14 DIAGNOSIS — I5033 Acute on chronic diastolic (congestive) heart failure: Secondary | ICD-10-CM | POA: Diagnosis not present

## 2022-01-14 DIAGNOSIS — N179 Acute kidney failure, unspecified: Secondary | ICD-10-CM | POA: Diagnosis not present

## 2022-01-14 LAB — RENAL FUNCTION PANEL
Albumin: 2.1 g/dL — ABNORMAL LOW (ref 3.5–5.0)
Anion gap: 8 (ref 5–15)
BUN: 28 mg/dL — ABNORMAL HIGH (ref 8–23)
CO2: 27 mmol/L (ref 22–32)
Calcium: 8.3 mg/dL — ABNORMAL LOW (ref 8.9–10.3)
Chloride: 106 mmol/L (ref 98–111)
Creatinine, Ser: 1.18 mg/dL — ABNORMAL HIGH (ref 0.44–1.00)
GFR, Estimated: 48 mL/min — ABNORMAL LOW (ref >60)
Glucose, Bld: 79 mg/dL (ref 70–99)
Phosphorus: 2.7 mg/dL (ref 2.5–4.6)
Potassium: 3.5 mmol/L (ref 3.5–5.1)
Sodium: 141 mmol/L (ref 135–145)

## 2022-01-15 DIAGNOSIS — N179 Acute kidney failure, unspecified: Secondary | ICD-10-CM | POA: Diagnosis not present

## 2022-01-15 DIAGNOSIS — R1310 Dysphagia, unspecified: Secondary | ICD-10-CM | POA: Diagnosis not present

## 2022-01-15 DIAGNOSIS — I5030 Unspecified diastolic (congestive) heart failure: Secondary | ICD-10-CM | POA: Diagnosis not present

## 2022-01-15 DIAGNOSIS — G9341 Metabolic encephalopathy: Secondary | ICD-10-CM | POA: Diagnosis not present

## 2022-01-16 DIAGNOSIS — G9341 Metabolic encephalopathy: Secondary | ICD-10-CM | POA: Diagnosis not present

## 2022-01-16 DIAGNOSIS — R1310 Dysphagia, unspecified: Secondary | ICD-10-CM | POA: Diagnosis not present

## 2022-01-16 DIAGNOSIS — N179 Acute kidney failure, unspecified: Secondary | ICD-10-CM | POA: Diagnosis not present

## 2022-01-16 DIAGNOSIS — I5033 Acute on chronic diastolic (congestive) heart failure: Secondary | ICD-10-CM | POA: Diagnosis not present

## 2022-01-17 DIAGNOSIS — N179 Acute kidney failure, unspecified: Secondary | ICD-10-CM | POA: Diagnosis not present

## 2022-01-17 DIAGNOSIS — I5030 Unspecified diastolic (congestive) heart failure: Secondary | ICD-10-CM | POA: Diagnosis not present

## 2022-01-17 DIAGNOSIS — G9341 Metabolic encephalopathy: Secondary | ICD-10-CM | POA: Diagnosis not present

## 2022-01-17 LAB — MAGNESIUM: Magnesium: 1.7 mg/dL (ref 1.7–2.4)

## 2022-01-17 LAB — CBC
HCT: 27.8 % — ABNORMAL LOW (ref 36.0–46.0)
Hemoglobin: 9 g/dL — ABNORMAL LOW (ref 12.0–15.0)
MCH: 31.1 pg (ref 26.0–34.0)
MCHC: 32.4 g/dL (ref 30.0–36.0)
MCV: 96.2 fL (ref 80.0–100.0)
Platelets: 161 10*3/uL (ref 150–400)
RBC: 2.89 MIL/uL — ABNORMAL LOW (ref 3.87–5.11)
RDW: 17.8 % — ABNORMAL HIGH (ref 11.5–15.5)
WBC: 7.7 10*3/uL (ref 4.0–10.5)
nRBC: 0.3 % — ABNORMAL HIGH (ref 0.0–0.2)

## 2022-01-17 LAB — BASIC METABOLIC PANEL
Anion gap: 9 (ref 5–15)
BUN: 33 mg/dL — ABNORMAL HIGH (ref 8–23)
CO2: 29 mmol/L (ref 22–32)
Calcium: 8.5 mg/dL — ABNORMAL LOW (ref 8.9–10.3)
Chloride: 103 mmol/L (ref 98–111)
Creatinine, Ser: 1.33 mg/dL — ABNORMAL HIGH (ref 0.44–1.00)
GFR, Estimated: 42 mL/min — ABNORMAL LOW (ref >60)
Glucose, Bld: 81 mg/dL (ref 70–99)
Potassium: 3.4 mmol/L — ABNORMAL LOW (ref 3.5–5.1)
Sodium: 141 mmol/L (ref 135–145)

## 2022-01-17 LAB — SEDIMENTATION RATE: Sed Rate: 74 mm/hr — ABNORMAL HIGH (ref 0–22)

## 2022-01-17 LAB — C-REACTIVE PROTEIN: CRP: 0.7 mg/dL (ref <1.0)

## 2022-01-18 DIAGNOSIS — G9341 Metabolic encephalopathy: Secondary | ICD-10-CM | POA: Diagnosis not present

## 2022-01-18 DIAGNOSIS — I5030 Unspecified diastolic (congestive) heart failure: Secondary | ICD-10-CM | POA: Diagnosis not present

## 2022-01-18 DIAGNOSIS — R1310 Dysphagia, unspecified: Secondary | ICD-10-CM | POA: Diagnosis not present

## 2022-01-18 DIAGNOSIS — N179 Acute kidney failure, unspecified: Secondary | ICD-10-CM | POA: Diagnosis not present

## 2022-01-18 LAB — URINALYSIS, ROUTINE W REFLEX MICROSCOPIC
Bilirubin Urine: NEGATIVE
Glucose, UA: NEGATIVE mg/dL
Ketones, ur: NEGATIVE mg/dL
Nitrite: NEGATIVE
Protein, ur: NEGATIVE mg/dL
Specific Gravity, Urine: <1.005 — ABNORMAL LOW (ref 1.005–1.030)
pH: 6 (ref 5.0–8.0)

## 2022-01-18 LAB — POTASSIUM: Potassium: 3.6 mmol/L (ref 3.5–5.1)

## 2022-01-18 LAB — URINALYSIS, MICROSCOPIC (REFLEX)

## 2022-01-18 LAB — MAGNESIUM: Magnesium: 1.9 mg/dL (ref 1.7–2.4)

## 2022-01-20 LAB — RENAL FUNCTION PANEL
Albumin: 2.4 g/dL — ABNORMAL LOW (ref 3.5–5.0)
Anion gap: 12 (ref 5–15)
BUN: 34 mg/dL — ABNORMAL HIGH (ref 8–23)
CO2: 27 mmol/L (ref 22–32)
Calcium: 8.5 mg/dL — ABNORMAL LOW (ref 8.9–10.3)
Chloride: 102 mmol/L (ref 98–111)
Creatinine, Ser: 1.15 mg/dL — ABNORMAL HIGH (ref 0.44–1.00)
GFR, Estimated: 50 mL/min — ABNORMAL LOW (ref >60)
Glucose, Bld: 86 mg/dL (ref 70–99)
Phosphorus: 3.3 mg/dL (ref 2.5–4.6)
Potassium: 3.8 mmol/L (ref 3.5–5.1)
Sodium: 141 mmol/L (ref 135–145)

## 2022-01-20 LAB — URINE CULTURE: Culture: >=100000 — AB

## 2022-01-20 LAB — MAGNESIUM: Magnesium: 2.1 mg/dL (ref 1.7–2.4)

## 2022-01-21 LAB — BASIC METABOLIC PANEL
Anion gap: 9 (ref 5–15)
BUN: 34 mg/dL — ABNORMAL HIGH (ref 8–23)
CO2: 27 mmol/L (ref 22–32)
Calcium: 8.5 mg/dL — ABNORMAL LOW (ref 8.9–10.3)
Chloride: 106 mmol/L (ref 98–111)
Creatinine, Ser: 1.22 mg/dL — ABNORMAL HIGH (ref 0.44–1.00)
GFR, Estimated: 46 mL/min — ABNORMAL LOW (ref >60)
Glucose, Bld: 98 mg/dL (ref 70–99)
Potassium: 3.7 mmol/L (ref 3.5–5.1)
Sodium: 142 mmol/L (ref 135–145)

## 2022-01-25 LAB — CBC
HCT: 27.2 % — ABNORMAL LOW (ref 36.0–46.0)
Hemoglobin: 8.6 g/dL — ABNORMAL LOW (ref 12.0–15.0)
MCH: 31 pg (ref 26.0–34.0)
MCHC: 31.6 g/dL (ref 30.0–36.0)
MCV: 98.2 fL (ref 80.0–100.0)
Platelets: 239 10*3/uL (ref 150–400)
RBC: 2.77 MIL/uL — ABNORMAL LOW (ref 3.87–5.11)
RDW: 19 % — ABNORMAL HIGH (ref 11.5–15.5)
WBC: 6.4 10*3/uL (ref 4.0–10.5)
nRBC: 0.6 % — ABNORMAL HIGH (ref 0.0–0.2)

## 2022-01-25 LAB — BASIC METABOLIC PANEL
Anion gap: 8 (ref 5–15)
BUN: 33 mg/dL — ABNORMAL HIGH (ref 8–23)
CO2: 26 mmol/L (ref 22–32)
Calcium: 8.8 mg/dL — ABNORMAL LOW (ref 8.9–10.3)
Chloride: 107 mmol/L (ref 98–111)
Creatinine, Ser: 1.25 mg/dL — ABNORMAL HIGH (ref 0.44–1.00)
GFR, Estimated: 45 mL/min — ABNORMAL LOW (ref >60)
Glucose, Bld: 79 mg/dL (ref 70–99)
Potassium: 3.4 mmol/L — ABNORMAL LOW (ref 3.5–5.1)
Sodium: 141 mmol/L (ref 135–145)

## 2022-01-25 LAB — MAGNESIUM: Magnesium: 1.9 mg/dL (ref 1.7–2.4)

## 2022-01-26 DIAGNOSIS — G9341 Metabolic encephalopathy: Secondary | ICD-10-CM | POA: Diagnosis not present

## 2022-01-26 DIAGNOSIS — I5030 Unspecified diastolic (congestive) heart failure: Secondary | ICD-10-CM | POA: Diagnosis not present

## 2022-01-26 DIAGNOSIS — N1831 Chronic kidney disease, stage 3a: Secondary | ICD-10-CM | POA: Diagnosis not present

## 2022-01-26 DIAGNOSIS — N179 Acute kidney failure, unspecified: Secondary | ICD-10-CM | POA: Diagnosis not present

## 2022-01-26 LAB — POTASSIUM: Potassium: 3.5 mmol/L (ref 3.5–5.1)

## 2022-01-29 LAB — MAGNESIUM: Magnesium: 1.8 mg/dL (ref 1.7–2.4)

## 2022-01-29 LAB — CBC
HCT: 26.1 % — ABNORMAL LOW (ref 36.0–46.0)
Hemoglobin: 8.3 g/dL — ABNORMAL LOW (ref 12.0–15.0)
MCH: 31.4 pg (ref 26.0–34.0)
MCHC: 31.8 g/dL (ref 30.0–36.0)
MCV: 98.9 fL (ref 80.0–100.0)
Platelets: 257 10*3/uL (ref 150–400)
RBC: 2.64 MIL/uL — ABNORMAL LOW (ref 3.87–5.11)
RDW: 19.4 % — ABNORMAL HIGH (ref 11.5–15.5)
WBC: 6.4 10*3/uL (ref 4.0–10.5)
nRBC: 0.5 % — ABNORMAL HIGH (ref 0.0–0.2)

## 2022-01-29 LAB — BASIC METABOLIC PANEL
Anion gap: 9 (ref 5–15)
BUN: 26 mg/dL — ABNORMAL HIGH (ref 8–23)
CO2: 25 mmol/L (ref 22–32)
Calcium: 8.3 mg/dL — ABNORMAL LOW (ref 8.9–10.3)
Chloride: 111 mmol/L (ref 98–111)
Creatinine, Ser: 1.09 mg/dL — ABNORMAL HIGH (ref 0.44–1.00)
GFR, Estimated: 53 mL/min — ABNORMAL LOW (ref >60)
Glucose, Bld: 87 mg/dL (ref 70–99)
Potassium: 3.3 mmol/L — ABNORMAL LOW (ref 3.5–5.1)
Sodium: 145 mmol/L (ref 135–145)

## 2022-01-30 LAB — POTASSIUM: Potassium: 4 mmol/L (ref 3.5–5.1)

## 2022-01-30 NOTE — Progress Notes (Signed)
?   01/30/22 1415  ?Clinical Encounter Type  ?Visited With Patient  ?Visit Type Initial  ?Referral From Family  ?Consult/Referral To Chaplain  ? ?Chaplain visited and actively listened to difficult experience  and importance of faith and family. Provided emotional and spiritual support and encouraged self-care and self-reflection as patient discussed long term healthcare plan.   ? ?Melody Haver, Resident Chaplain ?([phone] ?

## 2022-02-02 LAB — BASIC METABOLIC PANEL
Anion gap: 5 (ref 5–15)
BUN: 25 mg/dL — ABNORMAL HIGH (ref 8–23)
CO2: 30 mmol/L (ref 22–32)
Calcium: 8.7 mg/dL — ABNORMAL LOW (ref 8.9–10.3)
Chloride: 107 mmol/L (ref 98–111)
Creatinine, Ser: 1.24 mg/dL — ABNORMAL HIGH (ref 0.44–1.00)
GFR, Estimated: 45 mL/min — ABNORMAL LOW (ref >60)
Glucose, Bld: 84 mg/dL (ref 70–99)
Potassium: 3.8 mmol/L (ref 3.5–5.1)
Sodium: 142 mmol/L (ref 135–145)

## 2022-02-02 LAB — CBC
HCT: 27.9 % — ABNORMAL LOW (ref 36.0–46.0)
Hemoglobin: 8.9 g/dL — ABNORMAL LOW (ref 12.0–15.0)
MCH: 31.4 pg (ref 26.0–34.0)
MCHC: 31.9 g/dL (ref 30.0–36.0)
MCV: 98.6 fL (ref 80.0–100.0)
Platelets: 220 10*3/uL (ref 150–400)
RBC: 2.83 MIL/uL — ABNORMAL LOW (ref 3.87–5.11)
RDW: 19.1 % — ABNORMAL HIGH (ref 11.5–15.5)
WBC: 6.9 10*3/uL (ref 4.0–10.5)
nRBC: 0.4 % — ABNORMAL HIGH (ref 0.0–0.2)

## 2022-02-02 LAB — MAGNESIUM: Magnesium: 1.8 mg/dL (ref 1.7–2.4)

## 2022-02-03 DIAGNOSIS — R1312 Dysphagia, oropharyngeal phase: Secondary | ICD-10-CM | POA: Diagnosis not present

## 2022-02-03 DIAGNOSIS — R531 Weakness: Secondary | ICD-10-CM | POA: Diagnosis not present

## 2022-02-03 DIAGNOSIS — G9341 Metabolic encephalopathy: Secondary | ICD-10-CM | POA: Diagnosis not present

## 2022-02-03 DIAGNOSIS — N189 Chronic kidney disease, unspecified: Secondary | ICD-10-CM | POA: Diagnosis not present

## 2022-02-03 DIAGNOSIS — I739 Peripheral vascular disease, unspecified: Secondary | ICD-10-CM | POA: Diagnosis not present

## 2022-02-03 DIAGNOSIS — M19042 Primary osteoarthritis, left hand: Secondary | ICD-10-CM | POA: Diagnosis not present

## 2022-02-03 DIAGNOSIS — Z7401 Bed confinement status: Secondary | ICD-10-CM | POA: Diagnosis not present

## 2022-02-03 DIAGNOSIS — E785 Hyperlipidemia, unspecified: Secondary | ICD-10-CM | POA: Diagnosis not present

## 2022-02-03 DIAGNOSIS — R279 Unspecified lack of coordination: Secondary | ICD-10-CM | POA: Diagnosis not present

## 2022-02-03 DIAGNOSIS — R41841 Cognitive communication deficit: Secondary | ICD-10-CM | POA: Diagnosis not present

## 2022-02-03 DIAGNOSIS — I5030 Unspecified diastolic (congestive) heart failure: Secondary | ICD-10-CM | POA: Diagnosis not present

## 2022-02-03 DIAGNOSIS — M353 Polymyalgia rheumatica: Secondary | ICD-10-CM | POA: Diagnosis not present

## 2022-02-03 DIAGNOSIS — B3741 Candidal cystitis and urethritis: Secondary | ICD-10-CM | POA: Diagnosis not present

## 2022-02-03 DIAGNOSIS — R262 Difficulty in walking, not elsewhere classified: Secondary | ICD-10-CM | POA: Diagnosis not present

## 2022-02-03 DIAGNOSIS — M19032 Primary osteoarthritis, left wrist: Secondary | ICD-10-CM | POA: Diagnosis not present

## 2022-02-03 DIAGNOSIS — F339 Major depressive disorder, recurrent, unspecified: Secondary | ICD-10-CM | POA: Diagnosis not present

## 2022-02-03 DIAGNOSIS — M488X6 Other specified spondylopathies, lumbar region: Secondary | ICD-10-CM | POA: Diagnosis not present

## 2022-02-03 DIAGNOSIS — M6281 Muscle weakness (generalized): Secondary | ICD-10-CM | POA: Diagnosis not present

## 2022-02-03 DIAGNOSIS — D84821 Immunodeficiency due to drugs: Secondary | ICD-10-CM | POA: Diagnosis not present

## 2022-02-03 DIAGNOSIS — Z20822 Contact with and (suspected) exposure to covid-19: Secondary | ICD-10-CM | POA: Diagnosis not present

## 2022-02-03 DIAGNOSIS — D519 Vitamin B12 deficiency anemia, unspecified: Secondary | ICD-10-CM | POA: Diagnosis not present

## 2022-02-03 DIAGNOSIS — L97229 Non-pressure chronic ulcer of left calf with unspecified severity: Secondary | ICD-10-CM | POA: Diagnosis not present

## 2022-02-03 DIAGNOSIS — H2513 Age-related nuclear cataract, bilateral: Secondary | ICD-10-CM | POA: Diagnosis not present

## 2022-02-03 DIAGNOSIS — H40013 Open angle with borderline findings, low risk, bilateral: Secondary | ICD-10-CM | POA: Diagnosis not present

## 2022-02-03 DIAGNOSIS — H524 Presbyopia: Secondary | ICD-10-CM | POA: Diagnosis not present

## 2022-02-03 DIAGNOSIS — I10 Essential (primary) hypertension: Secondary | ICD-10-CM | POA: Diagnosis not present

## 2022-02-03 DIAGNOSIS — N179 Acute kidney failure, unspecified: Secondary | ICD-10-CM | POA: Diagnosis not present

## 2022-02-03 DIAGNOSIS — D529 Folate deficiency anemia, unspecified: Secondary | ICD-10-CM | POA: Diagnosis not present

## 2022-02-08 DIAGNOSIS — L97229 Non-pressure chronic ulcer of left calf with unspecified severity: Secondary | ICD-10-CM | POA: Diagnosis not present

## 2022-02-15 DIAGNOSIS — L97229 Non-pressure chronic ulcer of left calf with unspecified severity: Secondary | ICD-10-CM | POA: Diagnosis not present

## 2022-02-16 DIAGNOSIS — H524 Presbyopia: Secondary | ICD-10-CM | POA: Diagnosis not present

## 2022-02-16 DIAGNOSIS — H2513 Age-related nuclear cataract, bilateral: Secondary | ICD-10-CM | POA: Diagnosis not present

## 2022-02-16 DIAGNOSIS — H40013 Open angle with borderline findings, low risk, bilateral: Secondary | ICD-10-CM | POA: Diagnosis not present

## 2022-02-20 DIAGNOSIS — Z7952 Long term (current) use of systemic steroids: Secondary | ICD-10-CM | POA: Diagnosis not present

## 2022-02-20 DIAGNOSIS — B3741 Candidal cystitis and urethritis: Secondary | ICD-10-CM | POA: Diagnosis present

## 2022-02-20 DIAGNOSIS — E785 Hyperlipidemia, unspecified: Secondary | ICD-10-CM | POA: Diagnosis present

## 2022-02-20 DIAGNOSIS — I10 Essential (primary) hypertension: Secondary | ICD-10-CM | POA: Diagnosis not present

## 2022-02-20 DIAGNOSIS — G9341 Metabolic encephalopathy: Secondary | ICD-10-CM | POA: Diagnosis present

## 2022-02-20 DIAGNOSIS — R339 Retention of urine, unspecified: Secondary | ICD-10-CM | POA: Diagnosis not present

## 2022-02-20 DIAGNOSIS — M316 Other giant cell arteritis: Secondary | ICD-10-CM | POA: Diagnosis not present

## 2022-02-20 DIAGNOSIS — M0579 Rheumatoid arthritis with rheumatoid factor of multiple sites without organ or systems involvement: Secondary | ICD-10-CM | POA: Diagnosis not present

## 2022-02-20 DIAGNOSIS — K6812 Psoas muscle abscess: Secondary | ICD-10-CM | POA: Diagnosis not present

## 2022-02-20 DIAGNOSIS — Z20822 Contact with and (suspected) exposure to covid-19: Secondary | ICD-10-CM | POA: Diagnosis not present

## 2022-02-20 DIAGNOSIS — D529 Folate deficiency anemia, unspecified: Secondary | ICD-10-CM | POA: Diagnosis not present

## 2022-02-20 DIAGNOSIS — R279 Unspecified lack of coordination: Secondary | ICD-10-CM | POA: Diagnosis present

## 2022-02-20 DIAGNOSIS — R41841 Cognitive communication deficit: Secondary | ICD-10-CM | POA: Diagnosis present

## 2022-02-20 DIAGNOSIS — M488X6 Other specified spondylopathies, lumbar region: Secondary | ICD-10-CM | POA: Diagnosis present

## 2022-02-20 DIAGNOSIS — D84821 Immunodeficiency due to drugs: Secondary | ICD-10-CM | POA: Diagnosis present

## 2022-02-20 DIAGNOSIS — M353 Polymyalgia rheumatica: Secondary | ICD-10-CM | POA: Diagnosis present

## 2022-02-20 DIAGNOSIS — I5030 Unspecified diastolic (congestive) heart failure: Secondary | ICD-10-CM | POA: Diagnosis present

## 2022-02-20 DIAGNOSIS — E559 Vitamin D deficiency, unspecified: Secondary | ICD-10-CM | POA: Diagnosis not present

## 2022-02-20 DIAGNOSIS — F330 Major depressive disorder, recurrent, mild: Secondary | ICD-10-CM | POA: Diagnosis not present

## 2022-02-20 DIAGNOSIS — M5136 Other intervertebral disc degeneration, lumbar region: Secondary | ICD-10-CM | POA: Diagnosis not present

## 2022-02-20 DIAGNOSIS — M4804 Spinal stenosis, thoracic region: Secondary | ICD-10-CM | POA: Diagnosis not present

## 2022-02-20 DIAGNOSIS — N189 Chronic kidney disease, unspecified: Secondary | ICD-10-CM | POA: Diagnosis present

## 2022-02-20 DIAGNOSIS — E212 Other hyperparathyroidism: Secondary | ICD-10-CM | POA: Diagnosis not present

## 2022-02-20 DIAGNOSIS — Z79899 Other long term (current) drug therapy: Secondary | ICD-10-CM | POA: Diagnosis not present

## 2022-02-20 DIAGNOSIS — M6281 Muscle weakness (generalized): Secondary | ICD-10-CM | POA: Diagnosis present

## 2022-02-20 DIAGNOSIS — R262 Difficulty in walking, not elsewhere classified: Secondary | ICD-10-CM | POA: Diagnosis present

## 2022-02-20 DIAGNOSIS — M170 Bilateral primary osteoarthritis of knee: Secondary | ICD-10-CM | POA: Diagnosis not present

## 2022-02-20 DIAGNOSIS — L97929 Non-pressure chronic ulcer of unspecified part of left lower leg with unspecified severity: Secondary | ICD-10-CM | POA: Diagnosis not present

## 2022-02-20 DIAGNOSIS — L97229 Non-pressure chronic ulcer of left calf with unspecified severity: Secondary | ICD-10-CM | POA: Diagnosis not present

## 2022-02-20 DIAGNOSIS — G894 Chronic pain syndrome: Secondary | ICD-10-CM | POA: Diagnosis not present

## 2022-02-20 DIAGNOSIS — R300 Dysuria: Secondary | ICD-10-CM | POA: Diagnosis not present

## 2022-02-20 DIAGNOSIS — F339 Major depressive disorder, recurrent, unspecified: Secondary | ICD-10-CM | POA: Diagnosis present

## 2022-02-20 DIAGNOSIS — R350 Frequency of micturition: Secondary | ICD-10-CM | POA: Diagnosis not present

## 2022-02-20 DIAGNOSIS — N179 Acute kidney failure, unspecified: Secondary | ICD-10-CM | POA: Diagnosis present

## 2022-02-20 DIAGNOSIS — Z87448 Personal history of other diseases of urinary system: Secondary | ICD-10-CM | POA: Diagnosis not present

## 2022-02-20 DIAGNOSIS — I739 Peripheral vascular disease, unspecified: Secondary | ICD-10-CM | POA: Diagnosis present

## 2022-02-20 DIAGNOSIS — R1312 Dysphagia, oropharyngeal phase: Secondary | ICD-10-CM | POA: Diagnosis present

## 2022-02-20 DIAGNOSIS — D519 Vitamin B12 deficiency anemia, unspecified: Secondary | ICD-10-CM | POA: Diagnosis not present

## 2022-02-21 DIAGNOSIS — F330 Major depressive disorder, recurrent, mild: Secondary | ICD-10-CM | POA: Diagnosis not present

## 2022-02-22 DIAGNOSIS — L97229 Non-pressure chronic ulcer of left calf with unspecified severity: Secondary | ICD-10-CM | POA: Diagnosis not present

## 2022-02-27 ENCOUNTER — Ambulatory Visit (INDEPENDENT_AMBULATORY_CARE_PROVIDER_SITE_OTHER): Payer: Medicare Other | Admitting: Urology

## 2022-02-27 ENCOUNTER — Encounter: Payer: Self-pay | Admitting: Urology

## 2022-02-27 VITALS — BP 116/79 | HR 75

## 2022-02-27 DIAGNOSIS — R339 Retention of urine, unspecified: Secondary | ICD-10-CM

## 2022-02-27 DIAGNOSIS — R350 Frequency of micturition: Secondary | ICD-10-CM | POA: Diagnosis not present

## 2022-02-27 LAB — BLADDER SCAN AMB NON-IMAGING: Scan Result: >163

## 2022-02-27 MED ORDER — ALFUZOSIN HCL ER 10 MG PO TB24: 10.0000 mg | ORAL_TABLET | Freq: Every day | ORAL | 11 refills | Status: DC

## 2022-02-27 NOTE — Progress Notes (Signed)
? ?02/27/2022 ?10:55 AM  ? ?Fernand Parkins Reininger ?23-Oct-1946 ?[id number] ? ?Referring provider: Jake Samples, PA-C ?[address] ?[place],  [place] [address] ? ?Incomplete emptying ? ? ?HPI: ?Ms Nicole [name] is a 76yo here for evaluation of urinary retention. She has been hospitalized 3 times in the past year for AKI. Creatinine 1.1-1.4. During he last hospitalization 1 month ago she had a foley placed for UTI. PVR 168cc.She has episodes of urge incontinence which has been present for 3-4 years. She has urge incontinent episodes 2-3x per week. Nocturia 0x. Urine stream is fair. She denies any dysuria or hematuria. She is currently in a wheelchair and has poor mobility. She is on torsemide.  ? ? ?PMH: ?Past Medical History:  ?Diagnosis Date  ? AC (acromioclavicular) joint bone spurs   ? lt shoulder  ? Anemia   ? Arthritis   ? Carpal tunnel syndrome, bilateral   ? CHF (congestive heart failure) ([place])   ? Collagen vascular disease ([place])   ? Gastroesophageal reflux   ? Headache   ? recent visit to ER @ [facility] for severe headache  ? Hypertension   ? Lumbar stenosis   ? Hx of ESIs by Dr. Nelva Bush  ? Polyarthralgia   ? Polymyalgia ([place])   ? Shingles   ? Spinal stenosis   ? ? ?Surgical History: ?Past Surgical History:  ?Procedure Laterality Date  ? BACK SURGERY  07/05/2018, 07/2018  ? x2   ? BIOPSY  09/22/2020  ? Procedure: BIOPSY;  Surgeon: Rogene Houston, MD;  [facility];  Service: Endoscopy;;  ? BIOPSY  10/05/2021  ? Procedure: BIOPSY;  Surgeon: Harvel Quale, MD;  [facility];  Service: Gastroenterology;;  ? CHOLECYSTECTOMY    ? COLONOSCOPY  06/11/2012  ? Procedure: COLONOSCOPY;  Surgeon: Jamesetta So, MD;  [facility];  Service: Gastroenterology;  Laterality: N/A;  ? COLONOSCOPY WITH PROPOFOL N/A 09/22/2020  ? Procedure: COLONOSCOPY WITH PROPOFOL;  Surgeon: Rogene Houston, MD;  [facility];  Service: Endoscopy;  Laterality: N/A;  730  ?  ESOPHAGOGASTRODUODENOSCOPY (EGD) WITH PROPOFOL N/A 10/05/2021  ? Procedure: ESOPHAGOGASTRODUODENOSCOPY (EGD) WITH PROPOFOL;  Surgeon: Harvel Quale, MD;  [facility];  Service: Gastroenterology;  Laterality: N/A;  ? HYSTEROSCOPY WITH D & C N/A 02/25/2014  ? Procedure: DILATATION AND CURETTAGE /HYSTEROSCOPY;  Surgeon: Florian Buff, MD;  Location: [facility];  Service: Gynecology;  Laterality: N/A;  ? POLYPECTOMY N/A 02/25/2014  ? Procedure: POLYPECTOMY;  Surgeon: Florian Buff, MD;  Location: [facility];  Service: Gynecology;  Laterality: N/A;  ? RESECTION DISTAL CLAVICAL Right 03/26/2015  ? Procedure: OPEN DISTAL CLAVICAL RESECTION ;  Surgeon: Netta Cedars, MD;  Location: [facility];  Service: Orthopedics;  Laterality: Right;  ? ? ?Home Medications:  ?Allergies as of 02/27/2022   ? ?   Reactions  ? Oxycontin [oxycodone Hcl] Swelling  ? Pt tolerates hydromorphone.  ? Sulfa Antibiotics Other (See Comments)  ? Sores  ? Tramadol   ? "Makes me feel like I am falling"  ? Penicillins Rash  ? Tolerated augmentin  ? Sulfasalazine Other (See Comments)  ? Sores  ? ?  ? ?  ?Medication List  ?  ? ?  ? Accurate as of Feb 27, 2022 10:55 AM. If you have any questions, ask your nurse or doctor.  ?  ?  ? ?  ? ?acetaminophen 325 MG tablet ?Commonly known as: TYLENOL ?Take 2 tablets (  650 mg total) by mouth every 6 (six) hours as needed for mild pain or headache (or Fever >/= 101). ?  ?ascorbic acid 500 MG tablet ?Commonly known as: VITAMIN C ?Take 1 tablet (500 mg total) by mouth 2 (two) times daily. ?  ?calcitRIOL 0.25 MCG capsule ?Commonly known as: ROCALTROL ?Take 0.25 mcg by mouth See admin instructions. Take 0.25 every Mon, Wed, Fri. ?  ?citalopram 20 MG tablet ?Commonly known as: CELEXA ?Take 20 mg by mouth daily. ?  ?dextrose 5 % solution ?Inject 75 mL/hr into the vein continuous. ?  ?enoxaparin 30 MG/0.3ML injection ?Commonly known as: LOVENOX ?Inject 0.3 mLs (30 mg total) into the skin daily. Inpatient use for DVT  prophylaxis (dose has been renally adjusted) ?  ?feeding supplement (OSMOLITE 1.5 CAL) Liqd ?Place 1,000 mLs into feeding tube continuous. ?  ?feeding supplement (PROSource TF) liquid ?Place 45 mLs into feeding tube 2 (two) times daily. ?  ?HYDROmorphone 1 MG/ML injection ?Commonly known as: DILAUDID ?Inject 1 mL (1 mg total) into the vein every 4 (four) hours as needed for moderate pain. ?  ?leflunomide 10 MG tablet ?Commonly known as: ARAVA ?Take 1 tablet (10 mg total) by mouth daily. Remains on hold until completion of antibiotics and ability to tolerate PO's. ?  ?LORazepam 2 MG/ML injection ?Commonly known as: ATIVAN ?Inject 0.25 mLs (0.5 mg total) into the vein every 6 (six) hours as needed for anxiety. ?  ?metolazone 5 MG tablet ?Commonly known as: ZAROXOLYN ?Take 1 tablet (5 mg total) by mouth 2 (two) times a week. Resume Tuesday and Fridays; when renal function has fully recovered and patient is able to tolerate PO's. ?  ?metoprolol succinate 25 MG 24 hr tablet ?Commonly known as: Toprol XL ?Take 1 tablet (25 mg total) by mouth daily. ?  ?MULTIVITAMIN PO ?Take 1 tablet by mouth daily. Theratrum complete ?  ?Nucynta ER 200 MG Tb12 ?Generic drug: Tapentadol HCl ?Take 100 mg by mouth every 12 (twelve) hours. To be resume at adjusted dose when able to tolerate PO's and encephalopathy is resolved. ?  ?ondansetron 4 MG tablet ?Commonly known as: ZOFRAN ?Take 1 tablet (4 mg total) by mouth every 6 (six) hours as needed for nausea. ?  ?ondansetron 4 MG/2ML Soln injection ?Commonly known as: ZOFRAN ?Inject 2 mLs (4 mg total) into the vein every 6 (six) hours as needed for nausea. ?  ?pantoprazole 40 MG tablet ?Commonly known as: PROTONIX ?Take 1 tablet (40 mg total) by mouth 2 (two) times daily. ?  ?potassium chloride 10 MEQ tablet ?Commonly known as: KLOR-CON ?Take 2 tablets (20 mEq total) by mouth daily. ?  ?pravastatin 80 MG tablet ?Commonly known as: PRAVACHOL ?Take 1 tablet (80 mg total) by mouth daily. Resume  at time of discharge when able to tolerate PO's. ?  ?predniSONE 1 MG tablet ?Commonly known as: DELTASONE ?Take 9 tablets (9 mg total) by mouth daily with breakfast. ?  ?Torsemide 40 MG Tabs ?Take 40 mg by mouth 2 (two) times daily. Dose to be further adjusted and medication resume when renal function fully recovered and patient able to tolerate PO's. ?  ?traZODone 50 MG tablet ?Commonly known as: DESYREL ?Take 25 mg by mouth at bedtime. ?  ?trolamine salicylate 10 % cream ?Commonly known as: ASPERCREME ?Apply 1 application topically as needed for muscle pain. ?  ?zinc sulfate 220 (50 Zn) MG capsule ?Take 1 capsule (220 mg total) by mouth daily. ?  ? ?  ? ? ?Allergies:  ?  Allergies  ?Allergen Reactions  ? Oxycontin [Oxycodone Hcl] Swelling  ?  Pt tolerates hydromorphone.  ? Sulfa Antibiotics Other (See Comments)  ?  Sores  ? Tramadol   ?  "Makes me feel like I am falling"  ? Penicillins Rash  ?  Tolerated augmentin ?  ? Sulfasalazine Other (See Comments)  ?  Sores  ? ? ?Family History: ?Family History  ?Problem Relation Age of Onset  ? Hypertension Mother   ? Pneumonia Father   ? Arthritis Sister   ? Diabetes Paternal Grandmother   ? ? ?Social History:  reports that she has never smoked. She has never used smokeless tobacco. She reports that she does not drink alcohol and does not use drugs. ? ?ROS: ?All other review of systems were reviewed and are negative except what is noted above in HPI ? ?Physical Exam: ?BP 116/79   Pulse 75   ?Constitutional:  Alert and oriented, No acute distress. ?HEENT: [place] AT, moist mucus membranes.  Trachea midline, no masses. ?Cardiovascular: No clubbing, cyanosis, or edema. ?Respiratory: Normal respiratory effort, no increased work of breathing. ?GI: Abdomen is soft, nontender, nondistended, no abdominal masses ?GU: No CVA tenderness.  ?Lymph: No cervical or inguinal lymphadenopathy. ?Skin: No rashes, bruises or suspicious lesions. ?Neurologic: Grossly intact, no focal deficits, moving  all 4 extremities. ?Psychiatric: Normal mood and affect. ? ?Laboratory Data: ?Lab Results  ?Component Value Date  ? WBC 6.9 02/02/2022  ? HGB 8.9 (L) 02/02/2022  ? HCT 27.9 (L) 02/02/2022  ? MCV 98.6 02/02/2022  ? PLT 220 02/02/2022  ?

## 2022-02-27 NOTE — Progress Notes (Signed)
post void residual=>163 ?

## 2022-02-27 NOTE — Patient Instructions (Signed)
Acute Urinary Retention, Female  Acute urinary retention is a condition in which a person is unable to pass urine or can only pass a little urine. This condition can happen suddenly and last for a short time. If left untreated, it can become long-term (chronic) and result in kidney damage or other serious complications. What are the causes? This condition may be caused by: Obstruction or narrowing of the tube that drains the bladder (urethra). This may be caused by surgery, problems with nearby organs, or injury to the bladder or urethra. Problems with the nerves in the bladder. Pelvic organ prolapse. Tumors in the area of the pelvis, bladder, or urethra. Vaginal childbirth. Bladder or urinary tract infection. Constipation. Certain medicines. What increases the risk? This condition is more likely to develop in women over age 50. Other chronic health conditions can increase the risk of acute urinary retention. These include: Diseases such as multiple sclerosis. Spinal cord injuries. Diabetes. Degenerative cognitive conditions, such as delirium or dementia. Psychological conditions. A woman may hold her urine due to trauma or because she does not want to use the bathroom. History of preexisting urinary retention. History of prior pelvic surgery, incontinence surgery, or radical pelvic surgery. What are the signs or symptoms? Symptoms of this condition include: Trouble urinating. Pain in the lower abdomen. How is this diagnosed? This condition is diagnosed based on a physical exam and your medical history. You may also have other tests, including: An ultrasound of the bladder or kidneys or both. Blood tests. A urine analysis. Additional tests may be needed, such as a CT scan, MRI, and kidney or bladder function tests. How is this treated? Treatment for this condition may include: Medicines. Placing a thin, sterile tube (catheter) into the bladder to drain urine out of the body. This  is called an indwelling urinary catheter. After it is inserted, the catheter is held in place with a small balloon that is filled with sterile water. Urine drains from the catheter into a collection bag outside of the body. Behavioral therapy. Treatment for other conditions. If needed, you may be treated in the hospital for kidney function problems or to manage other complications. Follow these instructions at home: Medicines Take over-the-counter and prescription medicines only as told by your health care provider. Avoid certain medicines, such as decongestants, antihistamines, and some prescription medicines. Do not take any medicine unless your health care provider approves. If you were prescribed an antibiotic medicine, take it as told by your health care provider. Do not stop using the antibiotic even if you start to feel better. General instructions Do not use any products that contain nicotine or tobacco. These products include cigarettes, chewing tobacco, and vaping devices, such as e-cigarettes. If you need help quitting, ask your health care provider. Drink enough fluid to keep your urine pale yellow. If you have an indwelling urinary catheter, follow the instructions from your health care provider. Monitor any changes in your symptoms. Tell your health care provider about any changes. If instructed, monitor your blood pressure at home. Report changes as told by your health care provider. Keep all follow-up visits. This is important. Contact a health care provider if: You have uncomfortable bladder contractions that you cannot control (spasms). You leak urine with the spasms. Get help right away if: You have chills or a fever. You have blood in your urine. You have a catheter and the following happens: Your catheter stops draining urine. Your catheter falls out. Summary Acute urinary retention is a   condition in which a person is unable to pass urine or can only pass a little urine.  If left untreated, this can result in kidney damage or other serious complications. One cause of this condition may be obstruction or narrowing of the tube that drains the bladder (urethra). This may be caused by surgery, problems with nearby organs, or injury to the bladder or urethra. Treatment may include medicines and placement of an indwelling urinary catheter. Monitor any changes in your symptoms. Tell your health care provider about any changes. This information is not intended to replace advice given to you by your health care provider. Make sure you discuss any questions you have with your health care provider. Document Revised: 06/30/2020 Document Reviewed: 06/30/2020 Elsevier Patient Education  2023 [employer]  

## 2022-02-27 NOTE — Addendum Note (Signed)
Addended byIris Pert on: 02/27/2022 11:23 AM ? ? Modules accepted: Orders ? ?

## 2022-03-01 DIAGNOSIS — Z20822 Contact with and (suspected) exposure to covid-19: Secondary | ICD-10-CM | POA: Diagnosis not present

## 2022-03-01 DIAGNOSIS — L97229 Non-pressure chronic ulcer of left calf with unspecified severity: Secondary | ICD-10-CM | POA: Diagnosis not present

## 2022-03-01 NOTE — Progress Notes (Signed)
Office Visit Note  Patient: Nicole [name]             Date of Birth: 1946-09-17           MRN: [id number]             PCP: Jake Samples, PA-C Referring: Jake Samples, [place]* Visit Date: 03/15/2022 Occupation: '@GUAROCC'$ @  Subjective:  Fatigue  History of Present Illness: Nicole [name] is a 76 y.o. female with history of rheumatoid arthritis, polymyalgia rheumatica and temporal arteritis.  Patient was accompanied by the caregiver.  Her son [name] was on the phone who gave the history.  He stated the patient developed UTI and then sepsis.  She was in the ICU for 10 days in [place].  She went into renal failure.  After that she was in the stepdown unit for about 2 months.  She had a feeding tube and was on supplemental feeding for a while.  She is in a rehab center now.  She recently started eating.  She still have problems with transferring.  She was taken off all the medications.  She has been on prednisone 15 mg p.o. daily.  Patient denies any increased pain on prednisone.  She denies any increased swelling.  She is going to wound care center for the ulcer she developed on her left lower extremity. Chart review: December 23, 2021 discharge note stated that patient was hospitalized twice in the 3 months for AKI, abdominal pain and swelling sepsis.  She had antibiotics for left lower leg cellulitis which turned into chronic ulceration.  She was admitted on December 17, 2021 with altered mental status and UTI.  Urine culture was positive for budding yeast.  She was treated with Diflucan and antibiotics.  She had NG tube placement due to encephalopathy.  She also was treated for heart failure.  She has been off leflunomide.  Her prednisone dose was increased to 15 mg p.o. daily.  She was on baseline prednisone dose of 10 mg p.o. daily.  Activities of Daily Living:  Patient reports morning stiffness for 1 hour.   Patient Reports nocturnal pain.  Difficulty dressing/grooming:  Reports Difficulty climbing stairs: Reports Difficulty getting out of chair: Reports Difficulty using hands for taps, buttons, cutlery, and/or writing: Reports  Review of Systems  Constitutional:  Negative for fatigue.  HENT:  Positive for mouth dryness.   Eyes:  Negative for dryness.  Respiratory:  Negative for shortness of breath.   Cardiovascular:  Negative for swelling in legs/feet.  Gastrointestinal:  Negative for constipation.  Endocrine: Positive for cold intolerance.  Genitourinary:  Negative for difficulty urinating.  Musculoskeletal:  Positive for joint pain, gait problem, joint pain, joint swelling, muscle weakness, morning stiffness and muscle tenderness.  Skin:  Negative for rash.  Allergic/Immunologic: Negative for susceptible to infections.  Neurological:  Positive for numbness and weakness.  Hematological:  Negative for bruising/bleeding tendency.  Psychiatric/Behavioral:  Negative for sleep disturbance.    PMFS History:  Patient Active Problem List   Diagnosis Date Noted   Encounter for feeding tube placement    Pressure injury of skin 12/21/2021   Acute renal failure (ARF) ([place]) 12/16/2021   Acute urinary retention 12/16/2021   Leg wound, left 12/16/2021   UTI (urinary tract infection) 12/16/2021   Dysphagia    Psoas abscess (HCC) 10/05/2021   Intractable nausea and vomiting 09/24/2021   Myositis 09/16/2021   Psoas abscess, left (2.0 cm on MRI)  09/16/2021   AKI (acute  kidney injury) ([place]) 69/48/5462   Acute metabolic encephalopathy 70/35/0093   Hypokalemia 07/20/2021   Elevated MCV 07/20/2021   Mixed hyperlipidemia 07/20/2021   Diverticulitis 05/02/2018   Diverticulitis of colon 05/01/2018   Morbid obesity with BMI of 50.0-59.9, adult ([place]) 05/01/2018   Chronic diastolic HF (heart failure) ([place]) 05/01/2018   On prednisone therapy 05/31/2017   Positive anti-CCP test 05/31/2017   Rheumatoid factor positive 05/31/2017   Memory loss 04/04/2017    Polymyalgia rheumatica ([place]) 04/04/2017   High risk medication use 12/07/2016   DDD (degenerative disc disease), lumbar 09/27/2016   HTN (hypertension) 06/05/2016   Chronic pain syndrome 06/05/2016   Acute diverticulitis 06/02/2016   Diverticulitis large intestine 06/02/2016   Spinal stenosis of lumbosacral region 02/29/2016   Joint pain 02/29/2016   Endometrial polyp 02/06/2014   Postmenopausal bleeding 01/30/2014   GERD (gastroesophageal reflux disease) 09/29/2013   Spinal stenosis, thoracic 09/29/2013   Shingles 09/29/2013   Thoracic or lumbosacral neuritis or radiculitis, unspecified 05/04/2011   Abnormality of gait 05/04/2011   Muscle weakness (generalized) 05/04/2011   Bilateral primary osteoarthritis of knee 04/07/2009   KNEE PAIN 04/07/2009    Past Medical History:  Diagnosis Date   AC (acromioclavicular) joint bone spurs    lt shoulder   Acute kidney failure (HCC)    Anemia    Arthritis    Carpal tunnel syndrome, bilateral    CHF (congestive heart failure) (HCC)    Chronic kidney disease    Chronic pain syndrome    Cognitive communication deficit    Collagen vascular disease ([place])    Dysphagia    Gastroesophageal reflux    Headache    recent visit to ER @ [facility] for severe headache   Hypertension    Lumbar stenosis    Hx of ESIs by Dr. Nelva Bush   Major depressive disorder    Metabolic encephalopathy    Muscle weakness    Polyarthralgia    Polymyalgia ([place])    Shingles    Spinal stenosis     Family History  Problem Relation Age of Onset   Hypertension Mother    Pneumonia Father    Arthritis Sister    Diabetes Paternal Grandmother    Past Surgical History:  Procedure Laterality Date   BACK SURGERY  07/05/2018, 07/2018   x2    BIOPSY  09/22/2020   Procedure: BIOPSY;  Surgeon: Rogene Houston, MD;  [facility];  Service: Endoscopy;;   BIOPSY  10/05/2021   Procedure: BIOPSY;  Surgeon: Harvel Quale, MD;  [facility];  Service: Gastroenterology;;   CHOLECYSTECTOMY     COLONOSCOPY  06/11/2012   Procedure: COLONOSCOPY;  Surgeon: Jamesetta So, MD;  [facility];  Service: Gastroenterology;  Laterality: N/A;   COLONOSCOPY WITH PROPOFOL N/A 09/22/2020   Procedure: COLONOSCOPY WITH PROPOFOL;  Surgeon: Rogene Houston, MD;  [facility];  Service: Endoscopy;  Laterality: N/A;  730   ESOPHAGOGASTRODUODENOSCOPY (EGD) WITH PROPOFOL N/A 10/05/2021   Procedure: ESOPHAGOGASTRODUODENOSCOPY (EGD) WITH PROPOFOL;  Surgeon: Harvel Quale, MD;  [facility];  Service: Gastroenterology;  Laterality: N/A;   HYSTEROSCOPY WITH D & C N/A 02/25/2014   Procedure: DILATATION AND CURETTAGE /HYSTEROSCOPY;  Surgeon: Florian Buff, MD;  Location: [facility];  Service: Gynecology;  Laterality: N/A;   POLYPECTOMY N/A 02/25/2014   Procedure: POLYPECTOMY;  Surgeon: Florian Buff, MD;  Location[facility];  Service: Gynecology;  Laterality: N/A;  RESECTION DISTAL CLAVICAL Right 03/26/2015   Procedure: OPEN DISTAL CLAVICAL RESECTION ;  Surgeon: Netta Cedars, MD;  Location: [place];  Service: Orthopedics;  Laterality: Right;   Social History   Social History Narrative   Lives at home alone.   Right-handed.   1-2 cups caffeine daily.   Immunization History  Administered Date(s) Administered   Influenza,inj,quad, With Preservative 07/23/2017   Unspecified SARS-COV-2 Vaccination 06/02/2020, 06/23/2020     Objective: Vital Signs: BP 116/76 (BP Location: Left Arm, Patient Position: Sitting, Cuff Size: Large)   Pulse 90   Resp 13   Ht 5' (1.524 m)   Wt 214 lb (97.1 kg) Comment: Patient reported  BMI 41.79 kg/m    Physical Exam Vitals and nursing note reviewed.  Constitutional:      Appearance: She is well-developed.  HENT:     Head: Normocephalic and atraumatic.  Eyes:     Conjunctiva/sclera: Conjunctivae normal.  Cardiovascular:     Rate and Rhythm: Normal rate and regular rhythm.      Heart sounds: Normal heart sounds.  Pulmonary:     Effort: Pulmonary effort is normal.     Breath sounds: Normal breath sounds.  Abdominal:     General: Bowel sounds are normal.     Palpations: Abdomen is soft.  Musculoskeletal:     Cervical back: Normal range of motion.  Lymphadenopathy:     Cervical: No cervical adenopathy.  Skin:    General: Skin is warm and dry.     Capillary Refill: Capillary refill takes less than 2 seconds.  Neurological:     Mental Status: She is alert and oriented to person, place, and time.  Psychiatric:        Behavior: Behavior normal.     Musculoskeletal Exam: Patient was in a wheelchair.  Abduction of bilateral shoulder joints was limited to 90 degrees.  She had good range of motion of her elbows, wrist joints, MCPs PIPs and DIPs with no synovitis.  Hip joints were difficult to assess in the wheelchair.  Knee joints with good range of motion.  She was wearing a brace in her left ankle.  Left lower extremity was wrapped due to recent leg ulcer.  Right ankle joint was in good range of motion without any tenderness.  CDAI Exam: CDAI Score: 0.8  Patient Global: 4 mm; Provider Global: 4 mm Swollen: 0 ; Tender: 0  Joint Exam 03/15/2022   No joint exam has been documented for this visit   There is currently no information documented on the homunculus. Go to the Rheumatology activity and complete the homunculus joint exam.  Investigation: No additional findings.  Imaging: No results found.  Recent Labs: Lab Results  Component Value Date   WBC 6.9 02/02/2022   HGB 8.9 (L) 02/02/2022   PLT 220 02/02/2022   NA 142 02/02/2022   K 3.8 02/02/2022   CL 107 02/02/2022   CO2 30 02/02/2022   GLUCOSE 84 02/02/2022   BUN 25 (H) 02/02/2022   CREATININE 1.24 (H) 02/02/2022   BILITOT 0.5 01/11/2022   ALKPHOS 84 01/11/2022   AST 17 01/11/2022   ALT 22 01/11/2022   PROT 4.8 (L) 01/11/2022   ALBUMIN 2.4 (L) 01/20/2022   CALCIUM 8.7 (L) 02/02/2022   GFRAA  44 (L) 01/27/2021   QFTBGOLDPLUS NEGATIVE 09/09/2020    Speciality Comments: PLQ Eye Exam: 12/13/17 WNl @ [facility]  Procedures:  No procedures performed Allergies: Oxycontin [oxycodone hcl], Sulfa antibiotics, Tramadol, Penicillins, and  Sulfasalazine   Assessment / Plan:     Visit Diagnoses: Rheumatoid arthritis involving multiple sites with positive rheumatoid factor (HCC) - positive RF, positive anti-CCP.  Patient had no synovitis on examination today.  She has been on prednisone 15 mg p.o. daily since the discharge from the hospital.  I discussed decreasing prednisone by 1 mg p.o. monthly as tolerated and bring her to baseline prednisone of 10 mg p.o. daily.  I would prefer to keep her off DMARDs and Biologics due to recurrent hospitalizations.  High risk medication use - prednisone 10 mg by mouth daily.  Darcus Pester and Areva were discontinued due to recurrent hospitalizations and infections.     Polymyalgia rheumatica ([place]) -she was doing well on prednisone 9 mg p.o. daily.  She is currently on 15 mg p.o. daily.  We will try to taper prednisone as tolerated.  She has generalized deconditioning after prolonged hospitalization.  She is in the rehab center now.  She is getting physical therapy at the rehab center.  Temporal arteritis (HCC)-she had no recurrence of temporal arteritis.  She denies any headaches or temporal artery tenderness.  On prednisone therapy-she has been on prednisone long-term.  Recurrent infections-patient had recurrent infections.  She also had several hospitalizations due to recurrent infections.  She has been hospitalized for iliopsoas abscess, urinary tract infection with sepsis, and she is currently dealing with lower extremity cellulitis with residual ulceration.  Medical records from previous hospitalizations were reviewed.  We will try to keep her off DMARDs.  We will also keep her on the least effective dose of prednisone.  Ulcer of left lower extremity,  unspecified ulcer stage (HCC)-she reports left lower extremity ulcer for which she has been going to wound care.  Her ulcer was dressed today.  Primary osteoarthritis of both knees-she denies any discomfort in her knee joints today.  She stays in the wheelchair.  Spinal stenosis, thoracic-chronic discomfort  DDD (degenerative disc disease), lumbar-chronic discomfort.  Chronic pain syndrome -she goes to pain management.  History of chronic kidney disease - Stage 3b CKD.  Followed by Dr. Theador Hawthorne.   Iliopsoas abscess ([place]) - Requiring recent hospitalization.  Currently at [facility] rehab center.   History of gastroesophageal reflux (GERD)  Vitamin D deficiency  History of diverticulitis  Colonic ulcer - Evident on colonoscopy 09/22/2021. Followed by Dr. Laural Golden. Nonbleeding ulcer noted at splenic flexure.   History of hypertension  Memory loss  Orders: No orders of the defined types were placed in this encounter.  No orders of the defined types were placed in this encounter.  Face-to-face time spent patient was more than 40 minutes.  More than 50% time was spent in counseling and coordination of care.  Follow-Up Instructions: Return in about 4 months (around 07/16/2022) for RA,PMR,TA.   Bo Merino, MD  Note - This record has been created using Editor, commissioning.  Chart creation errors have been sought, but may not always  have been located. Such creation errors do not reflect on  the standard of medical care.

## 2022-03-08 DIAGNOSIS — L97229 Non-pressure chronic ulcer of left calf with unspecified severity: Secondary | ICD-10-CM | POA: Diagnosis not present

## 2022-03-09 DIAGNOSIS — R300 Dysuria: Secondary | ICD-10-CM | POA: Diagnosis not present

## 2022-03-09 DIAGNOSIS — D519 Vitamin B12 deficiency anemia, unspecified: Secondary | ICD-10-CM | POA: Diagnosis not present

## 2022-03-09 DIAGNOSIS — E559 Vitamin D deficiency, unspecified: Secondary | ICD-10-CM | POA: Diagnosis not present

## 2022-03-09 DIAGNOSIS — E212 Other hyperparathyroidism: Secondary | ICD-10-CM | POA: Diagnosis not present

## 2022-03-09 DIAGNOSIS — I10 Essential (primary) hypertension: Secondary | ICD-10-CM | POA: Diagnosis not present

## 2022-03-09 DIAGNOSIS — D529 Folate deficiency anemia, unspecified: Secondary | ICD-10-CM | POA: Diagnosis not present

## 2022-03-15 ENCOUNTER — Ambulatory Visit (INDEPENDENT_AMBULATORY_CARE_PROVIDER_SITE_OTHER): Payer: Medicare Other | Admitting: Rheumatology

## 2022-03-15 ENCOUNTER — Encounter: Payer: Self-pay | Admitting: Rheumatology

## 2022-03-15 VITALS — BP 116/76 | HR 90 | Resp 13 | Ht 60.0 in | Wt 214.0 lb

## 2022-03-15 DIAGNOSIS — E559 Vitamin D deficiency, unspecified: Secondary | ICD-10-CM

## 2022-03-15 DIAGNOSIS — L97929 Non-pressure chronic ulcer of unspecified part of left lower leg with unspecified severity: Secondary | ICD-10-CM

## 2022-03-15 DIAGNOSIS — R413 Other amnesia: Secondary | ICD-10-CM

## 2022-03-15 DIAGNOSIS — Z79899 Other long term (current) drug therapy: Secondary | ICD-10-CM | POA: Diagnosis not present

## 2022-03-15 DIAGNOSIS — M353 Polymyalgia rheumatica: Secondary | ICD-10-CM

## 2022-03-15 DIAGNOSIS — Z8719 Personal history of other diseases of the digestive system: Secondary | ICD-10-CM

## 2022-03-15 DIAGNOSIS — K6812 Psoas muscle abscess: Secondary | ICD-10-CM | POA: Diagnosis not present

## 2022-03-15 DIAGNOSIS — M316 Other giant cell arteritis: Secondary | ICD-10-CM

## 2022-03-15 DIAGNOSIS — G894 Chronic pain syndrome: Secondary | ICD-10-CM

## 2022-03-15 DIAGNOSIS — M4804 Spinal stenosis, thoracic region: Secondary | ICD-10-CM

## 2022-03-15 DIAGNOSIS — M170 Bilateral primary osteoarthritis of knee: Secondary | ICD-10-CM

## 2022-03-15 DIAGNOSIS — M0579 Rheumatoid arthritis with rheumatoid factor of multiple sites without organ or systems involvement: Secondary | ICD-10-CM

## 2022-03-15 DIAGNOSIS — K633 Ulcer of intestine: Secondary | ICD-10-CM

## 2022-03-15 DIAGNOSIS — Z8679 Personal history of other diseases of the circulatory system: Secondary | ICD-10-CM

## 2022-03-15 DIAGNOSIS — M5136 Other intervertebral disc degeneration, lumbar region: Secondary | ICD-10-CM

## 2022-03-15 DIAGNOSIS — L97229 Non-pressure chronic ulcer of left calf with unspecified severity: Secondary | ICD-10-CM | POA: Diagnosis not present

## 2022-03-15 DIAGNOSIS — B999 Unspecified infectious disease: Secondary | ICD-10-CM

## 2022-03-15 DIAGNOSIS — Z7952 Long term (current) use of systemic steroids: Secondary | ICD-10-CM

## 2022-03-15 DIAGNOSIS — Z87448 Personal history of other diseases of urinary system: Secondary | ICD-10-CM | POA: Diagnosis not present

## 2022-03-15 DIAGNOSIS — M25552 Pain in left hip: Secondary | ICD-10-CM

## 2022-03-21 DIAGNOSIS — F330 Major depressive disorder, recurrent, mild: Secondary | ICD-10-CM | POA: Diagnosis not present

## 2022-03-23 DIAGNOSIS — R338 Other retention of urine: Secondary | ICD-10-CM | POA: Diagnosis not present

## 2022-03-23 DIAGNOSIS — E211 Secondary hyperparathyroidism, not elsewhere classified: Secondary | ICD-10-CM | POA: Diagnosis not present

## 2022-03-23 DIAGNOSIS — I129 Hypertensive chronic kidney disease with stage 1 through stage 4 chronic kidney disease, or unspecified chronic kidney disease: Secondary | ICD-10-CM | POA: Diagnosis not present

## 2022-03-23 DIAGNOSIS — I5032 Chronic diastolic (congestive) heart failure: Secondary | ICD-10-CM | POA: Diagnosis not present

## 2022-03-23 DIAGNOSIS — Z6841 Body Mass Index (BMI) 40.0 and over, adult: Secondary | ICD-10-CM | POA: Diagnosis not present

## 2022-03-23 DIAGNOSIS — N1832 Chronic kidney disease, stage 3b: Secondary | ICD-10-CM | POA: Diagnosis not present

## 2022-03-23 DIAGNOSIS — D638 Anemia in other chronic diseases classified elsewhere: Secondary | ICD-10-CM | POA: Diagnosis not present

## 2022-03-27 ENCOUNTER — Ambulatory Visit: Payer: Medicare Other | Admitting: Physician Assistant

## 2022-03-28 ENCOUNTER — Ambulatory Visit: Payer: Medicare Other | Admitting: Physician Assistant

## 2022-03-29 DIAGNOSIS — I509 Heart failure, unspecified: Secondary | ICD-10-CM | POA: Diagnosis not present

## 2022-03-29 DIAGNOSIS — E559 Vitamin D deficiency, unspecified: Secondary | ICD-10-CM | POA: Diagnosis not present

## 2022-03-29 DIAGNOSIS — L97229 Non-pressure chronic ulcer of left calf with unspecified severity: Secondary | ICD-10-CM | POA: Diagnosis not present

## 2022-03-29 DIAGNOSIS — D529 Folate deficiency anemia, unspecified: Secondary | ICD-10-CM | POA: Diagnosis not present

## 2022-03-29 DIAGNOSIS — I10 Essential (primary) hypertension: Secondary | ICD-10-CM | POA: Diagnosis not present

## 2022-03-31 DIAGNOSIS — B351 Tinea unguium: Secondary | ICD-10-CM | POA: Diagnosis not present

## 2022-03-31 DIAGNOSIS — I7091 Generalized atherosclerosis: Secondary | ICD-10-CM | POA: Diagnosis not present

## 2022-04-04 DIAGNOSIS — R051 Acute cough: Secondary | ICD-10-CM | POA: Diagnosis not present

## 2022-04-07 DIAGNOSIS — R300 Dysuria: Secondary | ICD-10-CM | POA: Diagnosis not present

## 2022-04-07 DIAGNOSIS — I509 Heart failure, unspecified: Secondary | ICD-10-CM | POA: Diagnosis not present

## 2022-04-14 DIAGNOSIS — E559 Vitamin D deficiency, unspecified: Secondary | ICD-10-CM | POA: Diagnosis not present

## 2022-04-14 DIAGNOSIS — D529 Folate deficiency anemia, unspecified: Secondary | ICD-10-CM | POA: Diagnosis not present

## 2022-04-14 DIAGNOSIS — D519 Vitamin B12 deficiency anemia, unspecified: Secondary | ICD-10-CM | POA: Diagnosis not present

## 2022-04-14 DIAGNOSIS — I10 Essential (primary) hypertension: Secondary | ICD-10-CM | POA: Diagnosis not present

## 2022-04-18 DIAGNOSIS — F330 Major depressive disorder, recurrent, mild: Secondary | ICD-10-CM | POA: Diagnosis not present

## 2022-04-21 DIAGNOSIS — H524 Presbyopia: Secondary | ICD-10-CM | POA: Diagnosis not present

## 2022-04-22 DIAGNOSIS — N189 Chronic kidney disease, unspecified: Secondary | ICD-10-CM | POA: Diagnosis not present

## 2022-04-22 DIAGNOSIS — D84821 Immunodeficiency due to drugs: Secondary | ICD-10-CM | POA: Diagnosis not present

## 2022-04-22 DIAGNOSIS — R41841 Cognitive communication deficit: Secondary | ICD-10-CM | POA: Diagnosis not present

## 2022-04-22 DIAGNOSIS — M488X6 Other specified spondylopathies, lumbar region: Secondary | ICD-10-CM | POA: Diagnosis not present

## 2022-04-22 DIAGNOSIS — B3741 Candidal cystitis and urethritis: Secondary | ICD-10-CM | POA: Diagnosis not present

## 2022-04-22 DIAGNOSIS — I5030 Unspecified diastolic (congestive) heart failure: Secondary | ICD-10-CM | POA: Diagnosis not present

## 2022-04-22 DIAGNOSIS — I739 Peripheral vascular disease, unspecified: Secondary | ICD-10-CM | POA: Diagnosis not present

## 2022-04-22 DIAGNOSIS — R279 Unspecified lack of coordination: Secondary | ICD-10-CM | POA: Diagnosis not present

## 2022-04-22 DIAGNOSIS — D638 Anemia in other chronic diseases classified elsewhere: Secondary | ICD-10-CM | POA: Diagnosis not present

## 2022-04-22 DIAGNOSIS — R1312 Dysphagia, oropharyngeal phase: Secondary | ICD-10-CM | POA: Diagnosis not present

## 2022-04-22 DIAGNOSIS — M353 Polymyalgia rheumatica: Secondary | ICD-10-CM | POA: Diagnosis not present

## 2022-04-22 DIAGNOSIS — Z6841 Body Mass Index (BMI) 40.0 and over, adult: Secondary | ICD-10-CM | POA: Diagnosis not present

## 2022-04-22 DIAGNOSIS — G9341 Metabolic encephalopathy: Secondary | ICD-10-CM | POA: Diagnosis not present

## 2022-04-22 DIAGNOSIS — M47816 Spondylosis without myelopathy or radiculopathy, lumbar region: Secondary | ICD-10-CM | POA: Diagnosis not present

## 2022-04-22 DIAGNOSIS — N1832 Chronic kidney disease, stage 3b: Secondary | ICD-10-CM | POA: Diagnosis not present

## 2022-04-22 DIAGNOSIS — E785 Hyperlipidemia, unspecified: Secondary | ICD-10-CM | POA: Diagnosis not present

## 2022-04-22 DIAGNOSIS — I872 Venous insufficiency (chronic) (peripheral): Secondary | ICD-10-CM | POA: Diagnosis not present

## 2022-04-22 DIAGNOSIS — I10 Essential (primary) hypertension: Secondary | ICD-10-CM | POA: Diagnosis not present

## 2022-04-22 DIAGNOSIS — F330 Major depressive disorder, recurrent, mild: Secondary | ICD-10-CM | POA: Diagnosis not present

## 2022-04-22 DIAGNOSIS — E211 Secondary hyperparathyroidism, not elsewhere classified: Secondary | ICD-10-CM | POA: Diagnosis not present

## 2022-04-22 DIAGNOSIS — I5032 Chronic diastolic (congestive) heart failure: Secondary | ICD-10-CM | POA: Diagnosis not present

## 2022-04-22 DIAGNOSIS — R262 Difficulty in walking, not elsewhere classified: Secondary | ICD-10-CM | POA: Diagnosis not present

## 2022-04-22 DIAGNOSIS — I129 Hypertensive chronic kidney disease with stage 1 through stage 4 chronic kidney disease, or unspecified chronic kidney disease: Secondary | ICD-10-CM | POA: Diagnosis not present

## 2022-04-22 DIAGNOSIS — N179 Acute kidney failure, unspecified: Secondary | ICD-10-CM | POA: Diagnosis not present

## 2022-04-22 DIAGNOSIS — M6281 Muscle weakness (generalized): Secondary | ICD-10-CM | POA: Diagnosis not present

## 2022-04-22 DIAGNOSIS — F339 Major depressive disorder, recurrent, unspecified: Secondary | ICD-10-CM | POA: Diagnosis not present

## 2022-04-22 DIAGNOSIS — M1611 Unilateral primary osteoarthritis, right hip: Secondary | ICD-10-CM | POA: Diagnosis not present

## 2022-05-02 DIAGNOSIS — F330 Major depressive disorder, recurrent, mild: Secondary | ICD-10-CM | POA: Diagnosis not present

## 2022-05-10 DIAGNOSIS — M47816 Spondylosis without myelopathy or radiculopathy, lumbar region: Secondary | ICD-10-CM | POA: Diagnosis not present

## 2022-05-10 DIAGNOSIS — M1611 Unilateral primary osteoarthritis, right hip: Secondary | ICD-10-CM | POA: Diagnosis not present

## 2022-05-12 NOTE — Progress Notes (Signed)
CARDIOLOGY CONSULT NOTE       Patient ID: DREAMER CARILLO MRN: [id number] DOB/AGE: [birth year] 76 y.o.  Admit date: (Not on file) Referring Physician: Glennon Mac Primary Physician: Jake Samples, PA-C Primary Cardiologist: New Reason for Consultation: CHF  Active Problems:   * No active hospital problems. *   HPI:  76 y.o. referred by Glennon Mac PA for CHF Patient has been seen by Levell July PA in past never seen by cardiologist. She is morbidly obese with lymphedema , HTN and polymyalgia rheumatica Activity is limited by sciatica with previous back surgery complicated by PE Echo's have shown normal EF and only grade 1 diastolic dysfunction and trivial MR most recently done 07/20/21   Recent hospitalization for UTI /sepsis A,CRF with feeding tube and 2 month hospital stay at [place] finally d/c on 12/23/21 She also had psoas abscess LLE cellulitis with mild PAD by ABI"s on 12/17/21 right 0.8 and left 0.75   Echo 07/20/21 EF 65-70% moderate LVH AV sclerosis  ascending thoracic aorta 4.1 cm  Spoke with son [name] at length. She is better and hoping to return home with services Uncle lives next door and daughter lives in town as well  Still very limited in mobility Edema much better Cr 1.53   ROS All other systems reviewed and negative except as noted above  Past Medical History:  Diagnosis Date   AC (acromioclavicular) joint bone spurs    lt shoulder   Acute kidney failure (HCC)    Anemia    Arthritis    Carpal tunnel syndrome, bilateral    CHF (congestive heart failure) ([place])    Chronic kidney disease    Chronic pain syndrome    Cognitive communication deficit    Collagen vascular disease ([place])    Dysphagia    Gastroesophageal reflux    Headache    recent visit to ER @ [facility] for severe headache   Hypertension    Lumbar stenosis    Hx of ESIs by Dr. Nelva Bush   Major depressive disorder    Metabolic encephalopathy    Muscle weakness    Polyarthralgia    Polymyalgia ([place])     Shingles    Spinal stenosis     Family History  Problem Relation Age of Onset   Hypertension Mother    Pneumonia Father    Arthritis Sister    Diabetes Paternal Grandmother     Social History   Socioeconomic History   Marital status: Widowed    Spouse name: Not on file   Number of children: 2   Years of education: 1 yr coll   Highest education level: Not on file  Occupational History   Occupation: Retired  Tobacco Use   Smoking status: Never    Passive exposure: Never   Smokeless tobacco: Never  Vaping Use   Vaping Use: Never used  Substance and Sexual Activity   Alcohol use: No   Drug use: No   Sexual activity: Not Currently    Birth control/protection: Post-menopausal  Other Topics Concern   Not on file  Social History Narrative   Lives at home alone.   Right-handed.   1-2 cups caffeine daily.   Social Determinants of Health   Financial Resource Strain: Not on file  Food Insecurity: Not on file  Transportation Needs: Not on file  Physical Activity: Not on file  Stress: Not on file  Social Connections: Not on file  Intimate Partner Violence: Not on file    Past  Surgical History:  Procedure Laterality Date   BACK SURGERY  07/05/2018, 07/2018   x2    BIOPSY  09/22/2020   Procedure: BIOPSY;  Surgeon: Rogene Houston, MD;  [facility];  Service: Endoscopy;;   BIOPSY  10/05/2021   Procedure: BIOPSY;  Surgeon: Harvel Quale, MD;  [facility];  Service: Gastroenterology;;   CHOLECYSTECTOMY     COLONOSCOPY  06/11/2012   Procedure: COLONOSCOPY;  Surgeon: Jamesetta So, MD;  [facility];  Service: Gastroenterology;  Laterality: N/A;   COLONOSCOPY WITH PROPOFOL N/A 09/22/2020   Procedure: COLONOSCOPY WITH PROPOFOL;  Surgeon: Rogene Houston, MD;  [facility];  Service: Endoscopy;  Laterality: N/A;  730   ESOPHAGOGASTRODUODENOSCOPY (EGD) WITH PROPOFOL N/A 10/05/2021   Procedure: ESOPHAGOGASTRODUODENOSCOPY  (EGD) WITH PROPOFOL;  Surgeon: Harvel Quale, MD;  [facility];  Service: Gastroenterology;  Laterality: N/A;   HYSTEROSCOPY WITH D & C N/A 02/25/2014   Procedure: DILATATION AND CURETTAGE /HYSTEROSCOPY;  Surgeon: Florian Buff, MD;  Location[facility];  Service: Gynecology;  Laterality: N/A;   POLYPECTOMY N/A 02/25/2014   Procedure: POLYPECTOMY;  Surgeon: Florian Buff, MD;  Location: [facility];  Service: Gynecology;  Laterality: N/A;   RESECTION DISTAL CLAVICAL Right 03/26/2015   Procedure: OPEN DISTAL CLAVICAL RESECTION ;  Surgeon: Netta Cedars, MD;  Location: [place];  Service: Orthopedics;  Laterality: Right;      Current Outpatient Medications:    acetaminophen (TYLENOL) 325 MG tablet, Take 2 tablets (650 mg total) by mouth every 6 (six) hours as needed for mild pain or headache (or Fever >/= 101)., Disp: , Rfl:    alfuzosin (UROXATRAL) 10 MG 24 hr tablet, Take 1 tablet (10 mg total) by mouth at bedtime., Disp: 30 tablet, Rfl: 11   ascorbic acid (VITAMIN C) 500 MG tablet, Take 1 tablet (500 mg total) by mouth 2 (two) times daily., Disp: , Rfl:    calcitRIOL (ROCALTROL) 0.25 MCG capsule, Take 0.25 mcg by mouth See admin instructions. Take 0.25 every Mon, Wed, Fri., Disp: , Rfl:    celecoxib (CELEBREX) 100 MG capsule, Take 100 mg by mouth daily., Disp: , Rfl:    citalopram (CELEXA) 20 MG tablet, Take 20 mg by mouth daily., Disp: , Rfl:    cyclobenzaprine (FLEXERIL) 10 MG tablet, Take 10 mg by mouth 3 (three) times daily as needed for muscle spasms., Disp: , Rfl:    dextrose 5 % solution, Inject 75 mL/hr into the vein continuous., Disp: , Rfl:    diclofenac Sodium (VOLTAREN) 1 % GEL, Apply topically 4 (four) times daily., Disp: , Rfl:    famotidine (PEPCID) 10 MG tablet, Take 10 mg by mouth 2 (two) times daily., Disp: , Rfl:    folic acid (FOLVITE) 151 MCG tablet, Take 800 mcg by mouth daily., Disp: , Rfl:    gabapentin (NEURONTIN) 300 MG capsule, Take 300 mg by mouth 3 (three)  times daily., Disp: , Rfl:    HYDROcodone-acetaminophen (NORCO/VICODIN) 5-325 MG tablet, Take 1 tablet by mouth every morning., Disp: , Rfl:    lidocaine (ASPERCREME LIDOCAINE) 4 %, Place 1 patch onto the skin daily., Disp: , Rfl:    metoprolol succinate (TOPROL XL) 25 MG 24 hr tablet, Take 1 tablet (25 mg total) by mouth daily., Disp: 30 tablet, Rfl: 2   Multiple Vitamins-Minerals (MULTIVITAMIN PO), Take 1 tablet by mouth daily. Theratrum complete, Disp: , Rfl:    predniSONE (DELTASONE) 1  MG tablet, Take 9 tablets (9 mg total) by mouth daily with breakfast., Disp: 30 tablet, Rfl: 1   tamsulosin (FLOMAX) 0.4 MG CAPS capsule, Take 0.4 mg by mouth daily after breakfast., Disp: , Rfl:    Torsemide 40 MG TABS, Take 40 mg by mouth 2 (two) times daily. Dose to be further adjusted and medication resume when renal function fully recovered and patient able to tolerate PO's., Disp: , Rfl:    traZODone (DESYREL) 50 MG tablet, Take 25 mg by mouth at bedtime., Disp: , Rfl:    trolamine salicylate (ASPERCREME) 10 % cream, Apply 1 application  topically as needed for muscle pain., Disp: , Rfl:    zinc sulfate 220 (50 Zn) MG capsule, Take 1 capsule (220 mg total) by mouth daily., Disp: , Rfl:    potassium chloride (KLOR-CON) 10 MEQ tablet, Take 2 tablets (20 mEq total) by mouth daily., Disp: 60 tablet, Rfl: 0   pravastatin (PRAVACHOL) 80 MG tablet, Take 1 tablet (80 mg total) by mouth daily. Resume at time of discharge when able to tolerate PO's. (Patient not taking: Reported on 05/17/2022), Disp: , Rfl:     Physical Exam:   BP 106/70   Pulse 74   Ht '5\' 1"'$  (1.549 m)   Wt 242 lb (109.8 kg)   SpO2 94%   BMI 45.73 kg/m   Morbidly obese black female Cushingoid Lungs clear Soft SEM Chronic lymphedema bilateral improved  Brace on LLE ankle  Labs:   Lab Results  Component Value Date   WBC 6.9 02/02/2022   HGB 8.9 (L) 02/02/2022   HCT 27.9 (L) 02/02/2022   MCV 98.6 02/02/2022   PLT 220 02/02/2022    No results for input(s): "NA", "K", "CL", "CO2", "BUN", "CREATININE", "CALCIUM", "PROT", "BILITOT", "ALKPHOS", "ALT", "AST", "GLUCOSE" in the last 168 hours.  Invalid input(s): "LABALBU" Lab Results  Component Value Date   CKTOTAL 93 01/14/2018   CKMB 2.8 10/26/2008   TROPONINI <0.30 08/20/2013    Lab Results  Component Value Date   CHOL 135 09/18/2021   CHOL 195 07/20/2021   Lab Results  Component Value Date   HDL 46 09/18/2021   HDL 106 07/20/2021   Lab Results  Component Value Date   LDLCALC 55 09/18/2021   LDLCALC 62 07/20/2021   Lab Results  Component Value Date   TRIG 169 (H) 09/18/2021   TRIG 136 07/20/2021   Lab Results  Component Value Date   CHOLHDL 2.9 09/18/2021   CHOLHDL 1.8 07/20/2021   No results found for: "LDLDIRECT"    Radiology: No results found.  EKG: SR Q wave 3,F no acute changes 01/24/22    ASSESSMENT AND PLAN:   Diastolic CHF:  not clear of diagnosis Suspect this is all obesity/chronic lymphedema Continue current diuretics Demedex 40 mg bid  A/CRF:  f/u primary /nephrology see above CR 1.24 on 02/02/22  PMR/RA:  has positive RF On prednisone distant history of temporal arteritis sciatica and chronic pain per rheumatiology Wound Care : ulcer on LLE f/u wound center   No need for chronic cardiology f/u   Signed: Jenkins Rouge 05/17/2022, 10:08 AM

## 2022-05-16 DIAGNOSIS — F330 Major depressive disorder, recurrent, mild: Secondary | ICD-10-CM | POA: Diagnosis not present

## 2022-05-17 ENCOUNTER — Ambulatory Visit (INDEPENDENT_AMBULATORY_CARE_PROVIDER_SITE_OTHER): Payer: Medicare Other | Admitting: Cardiovascular Disease

## 2022-05-17 ENCOUNTER — Encounter: Payer: Self-pay | Admitting: Cardiovascular Disease

## 2022-05-17 VITALS — BP 106/70 | HR 74 | Ht 61.0 in | Wt 242.0 lb

## 2022-05-17 DIAGNOSIS — I10 Essential (primary) hypertension: Secondary | ICD-10-CM | POA: Diagnosis not present

## 2022-05-17 DIAGNOSIS — I872 Venous insufficiency (chronic) (peripheral): Secondary | ICD-10-CM

## 2022-05-17 DIAGNOSIS — I5032 Chronic diastolic (congestive) heart failure: Secondary | ICD-10-CM

## 2022-05-17 NOTE — Patient Instructions (Signed)
Medication Instructions:  Your physician recommends that you continue on your current medications as directed. Please refer to the Current Medication list given to you today.  *If you need a refill on your cardiac medications before your next appointment, please call your pharmacy*   Lab Work: NONE   If you have labs (blood work) drawn today and your tests are completely normal, you will receive your results only by: [place] (if you have MyChart) OR A paper copy in the mail If you have any lab test that is abnormal or we need to change your treatment, we will call you to review the results.   Testing/Procedures: NONE    Follow-Up: At [facility], you and your health needs are our priority.  As part of our continuing mission to provide you with exceptional heart care, we have created designated Provider Care Teams.  These Care Teams include your primary Cardiologist (physician) and Advanced Practice Providers (APPs -  Physician Assistants and Nurse Practitioners) who all work together to provide you with the care you need, when you need it.  We recommend signing up for the patient portal called "MyChart".  Sign up information is provided on this After Visit Summary.  MyChart is used to connect with patients for Virtual Visits (Telemedicine).  Patients are able to view lab/test results, encounter notes, upcoming appointments, etc.  Non-urgent messages can be sent to your provider as well.   To learn more about what you can do with MyChart, go to [email or web address].    Your next appointment:    As Needed   The format for your next appointment:   In Person  Provider:   Jenkins Rouge, MD    Other Instructions Thank you for choosing [facility]!    Important Information About Sugar

## 2022-05-18 DIAGNOSIS — D638 Anemia in other chronic diseases classified elsewhere: Secondary | ICD-10-CM | POA: Diagnosis not present

## 2022-05-18 DIAGNOSIS — Z6841 Body Mass Index (BMI) 40.0 and over, adult: Secondary | ICD-10-CM | POA: Diagnosis not present

## 2022-05-18 DIAGNOSIS — I5032 Chronic diastolic (congestive) heart failure: Secondary | ICD-10-CM | POA: Diagnosis not present

## 2022-05-18 DIAGNOSIS — E211 Secondary hyperparathyroidism, not elsewhere classified: Secondary | ICD-10-CM | POA: Diagnosis not present

## 2022-05-18 DIAGNOSIS — I129 Hypertensive chronic kidney disease with stage 1 through stage 4 chronic kidney disease, or unspecified chronic kidney disease: Secondary | ICD-10-CM | POA: Diagnosis not present

## 2022-05-18 DIAGNOSIS — N1832 Chronic kidney disease, stage 3b: Secondary | ICD-10-CM | POA: Diagnosis not present

## 2022-05-22 ENCOUNTER — Other Ambulatory Visit: Payer: Self-pay | Admitting: *Deleted

## 2022-05-22 DIAGNOSIS — S81802A Unspecified open wound, left lower leg, initial encounter: Secondary | ICD-10-CM

## 2022-05-24 ENCOUNTER — Encounter: Payer: Self-pay | Admitting: Vascular Surgery

## 2022-05-24 ENCOUNTER — Ambulatory Visit (INDEPENDENT_AMBULATORY_CARE_PROVIDER_SITE_OTHER): Payer: Medicare Other

## 2022-05-24 ENCOUNTER — Ambulatory Visit (INDEPENDENT_AMBULATORY_CARE_PROVIDER_SITE_OTHER): Payer: Medicare Other | Admitting: Vascular Surgery

## 2022-05-24 VITALS — BP 134/67 | HR 77 | Temp 97.3°F | Resp 16 | Ht 61.0 in | Wt 250.0 lb

## 2022-05-24 DIAGNOSIS — S81802A Unspecified open wound, left lower leg, initial encounter: Secondary | ICD-10-CM

## 2022-05-24 NOTE — Progress Notes (Signed)
[facility]  Patient name: Nicole [name] MRN: [id number] DOB: [birth year] Sex: female  REASON FOR CONSULT: Evaluation lower extremity, rule out arterial insufficiency  HPI: Nicole [name] is a 76 y.o. female, who is here today for evaluation.  She has an extremely complex past history.  She is alert and oriented today and answers questions appropriately.  Her son, [name], is on the telephone throughout the encounter as well.  She has a long history of swelling in her lower extremities.  In looking at her chart she had a duplex in 2008 for evaluation of pain and swelling in her left lower extremity.  She has no history of DVT.  She has had a very difficult last 6 months.  She was admitted with UTI sepsis and chronic renal failure.  She had a feeding tube and a several month hospital stay and was discharged to rehab facility.  She developed a wound on the medial aspect of her distal left calf in October and has been treating it since this time.  She reports that for some time it was opened and continued to enlarge and had multiple different treatment.  She is now completely closed the wound with some thickened skin over this.  She does have chronic lower extremity swelling.  This is not necessarily related to positioning.  She reports that she has to sleep with her legs elevated due to difficulty with her back.  She reports that the swelling comes and goes.  She has no history of tissue loss on her feet bilaterally.  Past Medical History:  Diagnosis Date   AC (acromioclavicular) joint bone spurs    lt shoulder   Acute kidney failure (HCC)    Anemia    Arthritis    Carpal tunnel syndrome, bilateral    CHF (congestive heart failure) (HCC)    Chronic kidney disease    Chronic pain syndrome    Cognitive communication deficit    Collagen vascular disease ([place])    Dysphagia    Gastroesophageal reflux    Headache    recent visit to ER  @ [facility] for severe headache   Hypertension    Lumbar stenosis    Hx of ESIs by Dr. Nelva Bush   Major depressive disorder    Metabolic encephalopathy    Muscle weakness    Polyarthralgia    Polymyalgia ([place])    Shingles    Spinal stenosis     Family History  Problem Relation Age of Onset   Hypertension Mother    Pneumonia Father    Arthritis Sister    Diabetes Paternal Grandmother     SOCIAL HISTORY: Social History   Socioeconomic History   Marital status: Widowed    Spouse name: Not on file   Number of children: 2   Years of education: 1 yr coll   Highest education level: Not on file  Occupational History   Occupation: Retired  Tobacco Use   Smoking status: Never    Passive exposure: Never   Smokeless tobacco: Never  Vaping Use   Vaping Use: Never used  Substance and Sexual Activity   Alcohol use: No   Drug use: No   Sexual activity: Not Currently    Birth control/protection: Post-menopausal  Other Topics Concern   Not on file  Social History Narrative   Lives at home alone.   Right-handed.   1-2 cups caffeine daily.   Social Determinants of Health   Financial  Resource Strain: Not on file  Food Insecurity: Not on file  Transportation Needs: Not on file  Physical Activity: Not on file  Stress: Not on file  Social Connections: Not on file  Intimate Partner Violence: Not on file    Allergies  Allergen Reactions   Oxycontin [Oxycodone Hcl] Swelling    Pt tolerates hydromorphone.   Sulfa Antibiotics Other (See Comments)    Sores   Tramadol     "Makes me feel like I am falling"   Penicillins Rash    Tolerated augmentin    Sulfasalazine Other (See Comments)    Sores    Current Outpatient Medications  Medication Sig Dispense Refill   acetaminophen (TYLENOL) 325 MG tablet Take 2 tablets (650 mg total) by mouth every 6 (six) hours as needed for mild pain or headache (or Fever >/= 101).     alfuzosin (UROXATRAL) 10 MG 24 hr tablet Take 1 tablet (10  mg total) by mouth at bedtime. 30 tablet 11   ascorbic acid (VITAMIN C) 500 MG tablet Take 1 tablet (500 mg total) by mouth 2 (two) times daily.     calcitRIOL (ROCALTROL) 0.25 MCG capsule Take 0.25 mcg by mouth See admin instructions. Take 0.25 every Mon, Wed, Fri.     celecoxib (CELEBREX) 100 MG capsule Take 100 mg by mouth daily.     citalopram (CELEXA) 20 MG tablet Take 20 mg by mouth daily.     cyclobenzaprine (FLEXERIL) 10 MG tablet Take 10 mg by mouth 3 (three) times daily as needed for muscle spasms.     dextrose 5 % solution Inject 75 mL/hr into the vein continuous.     diclofenac Sodium (VOLTAREN) 1 % GEL Apply topically 4 (four) times daily.     famotidine (PEPCID) 10 MG tablet Take 10 mg by mouth 2 (two) times daily.     folic acid (FOLVITE) 382 MCG tablet Take 800 mcg by mouth daily.     gabapentin (NEURONTIN) 300 MG capsule Take 300 mg by mouth 3 (three) times daily.     HYDROcodone-acetaminophen (NORCO/VICODIN) 5-325 MG tablet Take 1 tablet by mouth every morning.     lidocaine (ASPERCREME LIDOCAINE) 4 % Place 1 patch onto the skin daily.     metoprolol succinate (TOPROL XL) 25 MG 24 hr tablet Take 1 tablet (25 mg total) by mouth daily. 30 tablet 2   Multiple Vitamins-Minerals (MULTIVITAMIN PO) Take 1 tablet by mouth daily. Theratrum complete     potassium chloride (KLOR-CON) 10 MEQ tablet Take 2 tablets (20 mEq total) by mouth daily. 60 tablet 0   pravastatin (PRAVACHOL) 80 MG tablet Take 1 tablet (80 mg total) by mouth daily. Resume at time of discharge when able to tolerate PO's. (Patient not taking: Reported on 05/17/2022)     predniSONE (DELTASONE) 1 MG tablet Take 9 tablets (9 mg total) by mouth daily with breakfast. 30 tablet 1   tamsulosin (FLOMAX) 0.4 MG CAPS capsule Take 0.4 mg by mouth daily after breakfast.     Torsemide 40 MG TABS Take 40 mg by mouth 2 (two) times daily. Dose to be further adjusted and medication resume when renal function fully recovered and patient  able to tolerate PO's.     traZODone (DESYREL) 50 MG tablet Take 25 mg by mouth at bedtime.     trolamine salicylate (ASPERCREME) 10 % cream Apply 1 application  topically as needed for muscle pain.     zinc sulfate 220 (50 Zn) MG capsule Take  1 capsule (220 mg total) by mouth daily.     No current facility-administered medications for this visit.    REVIEW OF SYSTEMS:  '[X]'$  denotes positive finding, '[ ]'$  denotes negative finding Cardiac  Comments:  Chest pain or chest pressure:    Shortness of breath upon exertion:    Short of breath when lying flat:    Irregular heart rhythm:        Vascular    Pain in calf, thigh, or hip brought on by ambulation:    Pain in feet at night that wakes you up from your sleep:     Blood clot in your veins:    Leg swelling:  x       Pulmonary    Oxygen at home:    Productive cough:     Wheezing:         Neurologic    Sudden weakness in arms or legs:     Sudden numbness in arms or legs:     Sudden onset of difficulty speaking or slurred speech:    Temporary loss of vision in one eye:     Problems with dizziness:         Gastrointestinal    Blood in stool:     Vomited blood:         Genitourinary    Burning when urinating:     Blood in urine:        Psychiatric    Major depression:         Hematologic    Bleeding problems:    Problems with blood clotting too easily:        Skin    Rashes or ulcers:        Constitutional    Fever or chills:      PHYSICAL EXAM: Vitals:   05/24/22 1324  BP: 134/67  Pulse: 77  Resp: 16  Temp: (!) 97.3 F (36.3 C)  TempSrc: Temporal  SpO2: 94%  Weight: 250 lb (113.4 kg)  Height: '5\' 1"'$  (1.549 m)    GENERAL: The patient is a well-nourished female, in no acute distress. The vital signs are documented above. CARDIOVASCULAR: 2+ radial pulses bilaterally.  1-2+ dorsalis pedis pulses bilaterally PULMONARY: There is good air exchange  MUSCULOSKELETAL: There are no major deformities or  cyanosis. NEUROLOGIC: No focal weakness or paresthesias are detected. SKIN: No open ulcer.  She does have thickening over approximately 2 cm area on the medial aspect of her distal left calf which appears to be healing venous ulcer.  No drainage. PSYCHIATRIC: The patient has a normal affect.  DATA:  Noninvasive studies reveal incompressible vessels bilaterally.  She has normal triphasic waveforms bilaterally  MEDICAL ISSUES: I discussed this in detail with the patient and her son by telephone.  I explained that although she does have some thickening in the arteries she has no evidence of any occlusion.  I am not concerned regarding risk of limb loss.  I feel that this is related to venous stasis disease.  I explained the critical importance of compression.  I explained that I would recommend Ace wrap starting on the dorsum of her foot and extending to the below-knee position.  I explained that this should be done daily and could be removed at night.  I do not feel that she would be able to be fitted with compression garments due to her size and also because of her back issues.  She will see [place] again on an  as-needed basis   Rosetta Posner, MD [place] [facility] [phone] Pager [phone]  Note: Portions of this report may have been transcribed using voice recognition software.  Every effort has been made to ensure accuracy; however, inadvertent computerized transcription errors may still be present.

## 2022-05-29 DIAGNOSIS — M79642 Pain in left hand: Secondary | ICD-10-CM | POA: Diagnosis not present

## 2022-05-29 DIAGNOSIS — F32A Depression, unspecified: Secondary | ICD-10-CM | POA: Diagnosis not present

## 2022-05-29 DIAGNOSIS — E785 Hyperlipidemia, unspecified: Secondary | ICD-10-CM | POA: Diagnosis not present

## 2022-05-29 DIAGNOSIS — S81802A Unspecified open wound, left lower leg, initial encounter: Secondary | ICD-10-CM | POA: Diagnosis not present

## 2022-05-29 DIAGNOSIS — R635 Abnormal weight gain: Secondary | ICD-10-CM | POA: Diagnosis not present

## 2022-05-29 DIAGNOSIS — I509 Heart failure, unspecified: Secondary | ICD-10-CM | POA: Diagnosis not present

## 2022-05-29 DIAGNOSIS — G894 Chronic pain syndrome: Secondary | ICD-10-CM | POA: Diagnosis not present

## 2022-05-29 DIAGNOSIS — I10 Essential (primary) hypertension: Secondary | ICD-10-CM | POA: Diagnosis not present

## 2022-05-29 DIAGNOSIS — N189 Chronic kidney disease, unspecified: Secondary | ICD-10-CM | POA: Diagnosis not present

## 2022-05-29 DIAGNOSIS — R262 Difficulty in walking, not elsewhere classified: Secondary | ICD-10-CM | POA: Diagnosis not present

## 2022-05-29 DIAGNOSIS — I739 Peripheral vascular disease, unspecified: Secondary | ICD-10-CM | POA: Diagnosis not present

## 2022-05-31 DIAGNOSIS — I739 Peripheral vascular disease, unspecified: Secondary | ICD-10-CM | POA: Diagnosis not present

## 2022-05-31 DIAGNOSIS — R32 Unspecified urinary incontinence: Secondary | ICD-10-CM | POA: Diagnosis not present

## 2022-05-31 DIAGNOSIS — M4800 Spinal stenosis, site unspecified: Secondary | ICD-10-CM | POA: Diagnosis not present

## 2022-05-31 DIAGNOSIS — Z6841 Body Mass Index (BMI) 40.0 and over, adult: Secondary | ICD-10-CM | POA: Diagnosis not present

## 2022-05-31 DIAGNOSIS — Z7952 Long term (current) use of systemic steroids: Secondary | ICD-10-CM | POA: Diagnosis not present

## 2022-05-31 DIAGNOSIS — Z791 Long term (current) use of non-steroidal anti-inflammatories (NSAID): Secondary | ICD-10-CM | POA: Diagnosis not present

## 2022-05-31 DIAGNOSIS — I872 Venous insufficiency (chronic) (peripheral): Secondary | ICD-10-CM | POA: Diagnosis not present

## 2022-05-31 DIAGNOSIS — L97822 Non-pressure chronic ulcer of other part of left lower leg with fat layer exposed: Secondary | ICD-10-CM | POA: Diagnosis not present

## 2022-05-31 DIAGNOSIS — I5032 Chronic diastolic (congestive) heart failure: Secondary | ICD-10-CM | POA: Diagnosis not present

## 2022-05-31 DIAGNOSIS — I130 Hypertensive heart and chronic kidney disease with heart failure and stage 1 through stage 4 chronic kidney disease, or unspecified chronic kidney disease: Secondary | ICD-10-CM | POA: Diagnosis not present

## 2022-05-31 DIAGNOSIS — Z87440 Personal history of urinary (tract) infections: Secondary | ICD-10-CM | POA: Diagnosis not present

## 2022-05-31 DIAGNOSIS — N189 Chronic kidney disease, unspecified: Secondary | ICD-10-CM | POA: Diagnosis not present

## 2022-05-31 DIAGNOSIS — M353 Polymyalgia rheumatica: Secondary | ICD-10-CM | POA: Diagnosis not present

## 2022-05-31 DIAGNOSIS — K219 Gastro-esophageal reflux disease without esophagitis: Secondary | ICD-10-CM | POA: Diagnosis not present

## 2022-05-31 DIAGNOSIS — G8929 Other chronic pain: Secondary | ICD-10-CM | POA: Diagnosis not present

## 2022-05-31 DIAGNOSIS — R1310 Dysphagia, unspecified: Secondary | ICD-10-CM | POA: Diagnosis not present

## 2022-06-01 DIAGNOSIS — M069 Rheumatoid arthritis, unspecified: Secondary | ICD-10-CM | POA: Diagnosis not present

## 2022-06-01 DIAGNOSIS — I5032 Chronic diastolic (congestive) heart failure: Secondary | ICD-10-CM | POA: Diagnosis not present

## 2022-06-01 DIAGNOSIS — E782 Mixed hyperlipidemia: Secondary | ICD-10-CM | POA: Diagnosis not present

## 2022-06-01 DIAGNOSIS — E876 Hypokalemia: Secondary | ICD-10-CM | POA: Diagnosis not present

## 2022-06-01 DIAGNOSIS — M353 Polymyalgia rheumatica: Secondary | ICD-10-CM | POA: Diagnosis not present

## 2022-06-01 DIAGNOSIS — Z6841 Body Mass Index (BMI) 40.0 and over, adult: Secondary | ICD-10-CM | POA: Diagnosis not present

## 2022-06-01 DIAGNOSIS — R945 Abnormal results of liver function studies: Secondary | ICD-10-CM | POA: Diagnosis not present

## 2022-06-01 DIAGNOSIS — I10 Essential (primary) hypertension: Secondary | ICD-10-CM | POA: Diagnosis not present

## 2022-06-01 DIAGNOSIS — E538 Deficiency of other specified B group vitamins: Secondary | ICD-10-CM | POA: Diagnosis not present

## 2022-06-01 DIAGNOSIS — E559 Vitamin D deficiency, unspecified: Secondary | ICD-10-CM | POA: Diagnosis not present

## 2022-06-01 DIAGNOSIS — I110 Hypertensive heart disease with heart failure: Secondary | ICD-10-CM | POA: Diagnosis not present

## 2022-06-05 DIAGNOSIS — L97822 Non-pressure chronic ulcer of other part of left lower leg with fat layer exposed: Secondary | ICD-10-CM | POA: Diagnosis not present

## 2022-06-05 DIAGNOSIS — I130 Hypertensive heart and chronic kidney disease with heart failure and stage 1 through stage 4 chronic kidney disease, or unspecified chronic kidney disease: Secondary | ICD-10-CM | POA: Diagnosis not present

## 2022-06-05 DIAGNOSIS — I872 Venous insufficiency (chronic) (peripheral): Secondary | ICD-10-CM | POA: Diagnosis not present

## 2022-06-05 DIAGNOSIS — I5032 Chronic diastolic (congestive) heart failure: Secondary | ICD-10-CM | POA: Diagnosis not present

## 2022-06-05 DIAGNOSIS — N189 Chronic kidney disease, unspecified: Secondary | ICD-10-CM | POA: Diagnosis not present

## 2022-06-05 DIAGNOSIS — M353 Polymyalgia rheumatica: Secondary | ICD-10-CM | POA: Diagnosis not present

## 2022-06-07 DIAGNOSIS — I130 Hypertensive heart and chronic kidney disease with heart failure and stage 1 through stage 4 chronic kidney disease, or unspecified chronic kidney disease: Secondary | ICD-10-CM | POA: Diagnosis not present

## 2022-06-07 DIAGNOSIS — I5032 Chronic diastolic (congestive) heart failure: Secondary | ICD-10-CM | POA: Diagnosis not present

## 2022-06-07 DIAGNOSIS — I872 Venous insufficiency (chronic) (peripheral): Secondary | ICD-10-CM | POA: Diagnosis not present

## 2022-06-07 DIAGNOSIS — N189 Chronic kidney disease, unspecified: Secondary | ICD-10-CM | POA: Diagnosis not present

## 2022-06-07 DIAGNOSIS — L97822 Non-pressure chronic ulcer of other part of left lower leg with fat layer exposed: Secondary | ICD-10-CM | POA: Diagnosis not present

## 2022-06-07 DIAGNOSIS — M353 Polymyalgia rheumatica: Secondary | ICD-10-CM | POA: Diagnosis not present

## 2022-06-08 DIAGNOSIS — I5032 Chronic diastolic (congestive) heart failure: Secondary | ICD-10-CM | POA: Diagnosis not present

## 2022-06-08 DIAGNOSIS — M353 Polymyalgia rheumatica: Secondary | ICD-10-CM | POA: Diagnosis not present

## 2022-06-08 DIAGNOSIS — I872 Venous insufficiency (chronic) (peripheral): Secondary | ICD-10-CM | POA: Diagnosis not present

## 2022-06-08 DIAGNOSIS — N189 Chronic kidney disease, unspecified: Secondary | ICD-10-CM | POA: Diagnosis not present

## 2022-06-08 DIAGNOSIS — L97822 Non-pressure chronic ulcer of other part of left lower leg with fat layer exposed: Secondary | ICD-10-CM | POA: Diagnosis not present

## 2022-06-08 DIAGNOSIS — I130 Hypertensive heart and chronic kidney disease with heart failure and stage 1 through stage 4 chronic kidney disease, or unspecified chronic kidney disease: Secondary | ICD-10-CM | POA: Diagnosis not present

## 2022-06-13 DIAGNOSIS — L97822 Non-pressure chronic ulcer of other part of left lower leg with fat layer exposed: Secondary | ICD-10-CM | POA: Diagnosis not present

## 2022-06-13 DIAGNOSIS — M353 Polymyalgia rheumatica: Secondary | ICD-10-CM | POA: Diagnosis not present

## 2022-06-13 DIAGNOSIS — I872 Venous insufficiency (chronic) (peripheral): Secondary | ICD-10-CM | POA: Diagnosis not present

## 2022-06-13 DIAGNOSIS — I130 Hypertensive heart and chronic kidney disease with heart failure and stage 1 through stage 4 chronic kidney disease, or unspecified chronic kidney disease: Secondary | ICD-10-CM | POA: Diagnosis not present

## 2022-06-13 DIAGNOSIS — N189 Chronic kidney disease, unspecified: Secondary | ICD-10-CM | POA: Diagnosis not present

## 2022-06-13 DIAGNOSIS — I5032 Chronic diastolic (congestive) heart failure: Secondary | ICD-10-CM | POA: Diagnosis not present

## 2022-06-14 DIAGNOSIS — L97822 Non-pressure chronic ulcer of other part of left lower leg with fat layer exposed: Secondary | ICD-10-CM | POA: Diagnosis not present

## 2022-06-14 DIAGNOSIS — I5032 Chronic diastolic (congestive) heart failure: Secondary | ICD-10-CM | POA: Diagnosis not present

## 2022-06-14 DIAGNOSIS — M353 Polymyalgia rheumatica: Secondary | ICD-10-CM | POA: Diagnosis not present

## 2022-06-14 DIAGNOSIS — I872 Venous insufficiency (chronic) (peripheral): Secondary | ICD-10-CM | POA: Diagnosis not present

## 2022-06-14 DIAGNOSIS — N189 Chronic kidney disease, unspecified: Secondary | ICD-10-CM | POA: Diagnosis not present

## 2022-06-14 DIAGNOSIS — I130 Hypertensive heart and chronic kidney disease with heart failure and stage 1 through stage 4 chronic kidney disease, or unspecified chronic kidney disease: Secondary | ICD-10-CM | POA: Diagnosis not present

## 2022-06-19 DIAGNOSIS — I872 Venous insufficiency (chronic) (peripheral): Secondary | ICD-10-CM | POA: Diagnosis not present

## 2022-06-19 DIAGNOSIS — N189 Chronic kidney disease, unspecified: Secondary | ICD-10-CM | POA: Diagnosis not present

## 2022-06-19 DIAGNOSIS — L97822 Non-pressure chronic ulcer of other part of left lower leg with fat layer exposed: Secondary | ICD-10-CM | POA: Diagnosis not present

## 2022-06-19 DIAGNOSIS — I5032 Chronic diastolic (congestive) heart failure: Secondary | ICD-10-CM | POA: Diagnosis not present

## 2022-06-19 DIAGNOSIS — I130 Hypertensive heart and chronic kidney disease with heart failure and stage 1 through stage 4 chronic kidney disease, or unspecified chronic kidney disease: Secondary | ICD-10-CM | POA: Diagnosis not present

## 2022-06-19 DIAGNOSIS — M353 Polymyalgia rheumatica: Secondary | ICD-10-CM | POA: Diagnosis not present

## 2022-06-20 DIAGNOSIS — L97822 Non-pressure chronic ulcer of other part of left lower leg with fat layer exposed: Secondary | ICD-10-CM | POA: Diagnosis not present

## 2022-06-20 DIAGNOSIS — I130 Hypertensive heart and chronic kidney disease with heart failure and stage 1 through stage 4 chronic kidney disease, or unspecified chronic kidney disease: Secondary | ICD-10-CM | POA: Diagnosis not present

## 2022-06-20 DIAGNOSIS — M353 Polymyalgia rheumatica: Secondary | ICD-10-CM | POA: Diagnosis not present

## 2022-06-20 DIAGNOSIS — I5032 Chronic diastolic (congestive) heart failure: Secondary | ICD-10-CM | POA: Diagnosis not present

## 2022-06-20 DIAGNOSIS — N189 Chronic kidney disease, unspecified: Secondary | ICD-10-CM | POA: Diagnosis not present

## 2022-06-20 DIAGNOSIS — I872 Venous insufficiency (chronic) (peripheral): Secondary | ICD-10-CM | POA: Diagnosis not present

## 2022-06-27 DIAGNOSIS — I872 Venous insufficiency (chronic) (peripheral): Secondary | ICD-10-CM | POA: Diagnosis not present

## 2022-06-27 DIAGNOSIS — L97822 Non-pressure chronic ulcer of other part of left lower leg with fat layer exposed: Secondary | ICD-10-CM | POA: Diagnosis not present

## 2022-06-27 DIAGNOSIS — N189 Chronic kidney disease, unspecified: Secondary | ICD-10-CM | POA: Diagnosis not present

## 2022-06-27 DIAGNOSIS — M353 Polymyalgia rheumatica: Secondary | ICD-10-CM | POA: Diagnosis not present

## 2022-06-27 DIAGNOSIS — I5032 Chronic diastolic (congestive) heart failure: Secondary | ICD-10-CM | POA: Diagnosis not present

## 2022-06-27 DIAGNOSIS — I130 Hypertensive heart and chronic kidney disease with heart failure and stage 1 through stage 4 chronic kidney disease, or unspecified chronic kidney disease: Secondary | ICD-10-CM | POA: Diagnosis not present

## 2022-06-28 DIAGNOSIS — M353 Polymyalgia rheumatica: Secondary | ICD-10-CM | POA: Diagnosis not present

## 2022-06-28 DIAGNOSIS — N189 Chronic kidney disease, unspecified: Secondary | ICD-10-CM | POA: Diagnosis not present

## 2022-06-28 DIAGNOSIS — I5032 Chronic diastolic (congestive) heart failure: Secondary | ICD-10-CM | POA: Diagnosis not present

## 2022-06-28 DIAGNOSIS — I872 Venous insufficiency (chronic) (peripheral): Secondary | ICD-10-CM | POA: Diagnosis not present

## 2022-06-28 DIAGNOSIS — L97822 Non-pressure chronic ulcer of other part of left lower leg with fat layer exposed: Secondary | ICD-10-CM | POA: Diagnosis not present

## 2022-06-28 DIAGNOSIS — I130 Hypertensive heart and chronic kidney disease with heart failure and stage 1 through stage 4 chronic kidney disease, or unspecified chronic kidney disease: Secondary | ICD-10-CM | POA: Diagnosis not present

## 2022-06-30 DIAGNOSIS — Z791 Long term (current) use of non-steroidal anti-inflammatories (NSAID): Secondary | ICD-10-CM | POA: Diagnosis not present

## 2022-06-30 DIAGNOSIS — Z87440 Personal history of urinary (tract) infections: Secondary | ICD-10-CM | POA: Diagnosis not present

## 2022-06-30 DIAGNOSIS — I872 Venous insufficiency (chronic) (peripheral): Secondary | ICD-10-CM | POA: Diagnosis not present

## 2022-06-30 DIAGNOSIS — Z6841 Body Mass Index (BMI) 40.0 and over, adult: Secondary | ICD-10-CM | POA: Diagnosis not present

## 2022-06-30 DIAGNOSIS — I130 Hypertensive heart and chronic kidney disease with heart failure and stage 1 through stage 4 chronic kidney disease, or unspecified chronic kidney disease: Secondary | ICD-10-CM | POA: Diagnosis not present

## 2022-06-30 DIAGNOSIS — M353 Polymyalgia rheumatica: Secondary | ICD-10-CM | POA: Diagnosis not present

## 2022-06-30 DIAGNOSIS — Z7952 Long term (current) use of systemic steroids: Secondary | ICD-10-CM | POA: Diagnosis not present

## 2022-06-30 DIAGNOSIS — I739 Peripheral vascular disease, unspecified: Secondary | ICD-10-CM | POA: Diagnosis not present

## 2022-06-30 DIAGNOSIS — G8929 Other chronic pain: Secondary | ICD-10-CM | POA: Diagnosis not present

## 2022-06-30 DIAGNOSIS — K219 Gastro-esophageal reflux disease without esophagitis: Secondary | ICD-10-CM | POA: Diagnosis not present

## 2022-06-30 DIAGNOSIS — R32 Unspecified urinary incontinence: Secondary | ICD-10-CM | POA: Diagnosis not present

## 2022-06-30 DIAGNOSIS — M4800 Spinal stenosis, site unspecified: Secondary | ICD-10-CM | POA: Diagnosis not present

## 2022-06-30 DIAGNOSIS — R1310 Dysphagia, unspecified: Secondary | ICD-10-CM | POA: Diagnosis not present

## 2022-06-30 DIAGNOSIS — L97822 Non-pressure chronic ulcer of other part of left lower leg with fat layer exposed: Secondary | ICD-10-CM | POA: Diagnosis not present

## 2022-06-30 DIAGNOSIS — I5032 Chronic diastolic (congestive) heart failure: Secondary | ICD-10-CM | POA: Diagnosis not present

## 2022-06-30 DIAGNOSIS — N189 Chronic kidney disease, unspecified: Secondary | ICD-10-CM | POA: Diagnosis not present

## 2022-07-04 DIAGNOSIS — I130 Hypertensive heart and chronic kidney disease with heart failure and stage 1 through stage 4 chronic kidney disease, or unspecified chronic kidney disease: Secondary | ICD-10-CM | POA: Diagnosis not present

## 2022-07-04 DIAGNOSIS — M353 Polymyalgia rheumatica: Secondary | ICD-10-CM | POA: Diagnosis not present

## 2022-07-04 DIAGNOSIS — I872 Venous insufficiency (chronic) (peripheral): Secondary | ICD-10-CM | POA: Diagnosis not present

## 2022-07-04 DIAGNOSIS — N189 Chronic kidney disease, unspecified: Secondary | ICD-10-CM | POA: Diagnosis not present

## 2022-07-04 DIAGNOSIS — I5032 Chronic diastolic (congestive) heart failure: Secondary | ICD-10-CM | POA: Diagnosis not present

## 2022-07-04 DIAGNOSIS — L97822 Non-pressure chronic ulcer of other part of left lower leg with fat layer exposed: Secondary | ICD-10-CM | POA: Diagnosis not present

## 2022-07-05 DIAGNOSIS — I872 Venous insufficiency (chronic) (peripheral): Secondary | ICD-10-CM | POA: Diagnosis not present

## 2022-07-05 DIAGNOSIS — I5032 Chronic diastolic (congestive) heart failure: Secondary | ICD-10-CM | POA: Diagnosis not present

## 2022-07-05 DIAGNOSIS — L97822 Non-pressure chronic ulcer of other part of left lower leg with fat layer exposed: Secondary | ICD-10-CM | POA: Diagnosis not present

## 2022-07-05 DIAGNOSIS — I130 Hypertensive heart and chronic kidney disease with heart failure and stage 1 through stage 4 chronic kidney disease, or unspecified chronic kidney disease: Secondary | ICD-10-CM | POA: Diagnosis not present

## 2022-07-05 DIAGNOSIS — N189 Chronic kidney disease, unspecified: Secondary | ICD-10-CM | POA: Diagnosis not present

## 2022-07-05 DIAGNOSIS — M353 Polymyalgia rheumatica: Secondary | ICD-10-CM | POA: Diagnosis not present

## 2022-07-07 DIAGNOSIS — N189 Chronic kidney disease, unspecified: Secondary | ICD-10-CM | POA: Diagnosis not present

## 2022-07-07 DIAGNOSIS — M353 Polymyalgia rheumatica: Secondary | ICD-10-CM | POA: Diagnosis not present

## 2022-07-07 DIAGNOSIS — I872 Venous insufficiency (chronic) (peripheral): Secondary | ICD-10-CM | POA: Diagnosis not present

## 2022-07-07 DIAGNOSIS — I5032 Chronic diastolic (congestive) heart failure: Secondary | ICD-10-CM | POA: Diagnosis not present

## 2022-07-07 DIAGNOSIS — L97822 Non-pressure chronic ulcer of other part of left lower leg with fat layer exposed: Secondary | ICD-10-CM | POA: Diagnosis not present

## 2022-07-07 DIAGNOSIS — I130 Hypertensive heart and chronic kidney disease with heart failure and stage 1 through stage 4 chronic kidney disease, or unspecified chronic kidney disease: Secondary | ICD-10-CM | POA: Diagnosis not present

## 2022-07-11 DIAGNOSIS — I130 Hypertensive heart and chronic kidney disease with heart failure and stage 1 through stage 4 chronic kidney disease, or unspecified chronic kidney disease: Secondary | ICD-10-CM | POA: Diagnosis not present

## 2022-07-11 DIAGNOSIS — I872 Venous insufficiency (chronic) (peripheral): Secondary | ICD-10-CM | POA: Diagnosis not present

## 2022-07-11 DIAGNOSIS — N189 Chronic kidney disease, unspecified: Secondary | ICD-10-CM | POA: Diagnosis not present

## 2022-07-11 DIAGNOSIS — L97822 Non-pressure chronic ulcer of other part of left lower leg with fat layer exposed: Secondary | ICD-10-CM | POA: Diagnosis not present

## 2022-07-11 DIAGNOSIS — I5032 Chronic diastolic (congestive) heart failure: Secondary | ICD-10-CM | POA: Diagnosis not present

## 2022-07-11 DIAGNOSIS — M353 Polymyalgia rheumatica: Secondary | ICD-10-CM | POA: Diagnosis not present

## 2022-07-12 DIAGNOSIS — I872 Venous insufficiency (chronic) (peripheral): Secondary | ICD-10-CM | POA: Diagnosis not present

## 2022-07-12 DIAGNOSIS — N189 Chronic kidney disease, unspecified: Secondary | ICD-10-CM | POA: Diagnosis not present

## 2022-07-12 DIAGNOSIS — M353 Polymyalgia rheumatica: Secondary | ICD-10-CM | POA: Diagnosis not present

## 2022-07-12 DIAGNOSIS — I5032 Chronic diastolic (congestive) heart failure: Secondary | ICD-10-CM | POA: Diagnosis not present

## 2022-07-12 DIAGNOSIS — I130 Hypertensive heart and chronic kidney disease with heart failure and stage 1 through stage 4 chronic kidney disease, or unspecified chronic kidney disease: Secondary | ICD-10-CM | POA: Diagnosis not present

## 2022-07-12 DIAGNOSIS — L97822 Non-pressure chronic ulcer of other part of left lower leg with fat layer exposed: Secondary | ICD-10-CM | POA: Diagnosis not present

## 2022-07-14 DIAGNOSIS — L97822 Non-pressure chronic ulcer of other part of left lower leg with fat layer exposed: Secondary | ICD-10-CM | POA: Diagnosis not present

## 2022-07-14 DIAGNOSIS — I130 Hypertensive heart and chronic kidney disease with heart failure and stage 1 through stage 4 chronic kidney disease, or unspecified chronic kidney disease: Secondary | ICD-10-CM | POA: Diagnosis not present

## 2022-07-14 DIAGNOSIS — I872 Venous insufficiency (chronic) (peripheral): Secondary | ICD-10-CM | POA: Diagnosis not present

## 2022-07-14 DIAGNOSIS — I5032 Chronic diastolic (congestive) heart failure: Secondary | ICD-10-CM | POA: Diagnosis not present

## 2022-07-14 DIAGNOSIS — N189 Chronic kidney disease, unspecified: Secondary | ICD-10-CM | POA: Diagnosis not present

## 2022-07-14 DIAGNOSIS — M353 Polymyalgia rheumatica: Secondary | ICD-10-CM | POA: Diagnosis not present

## 2022-07-17 ENCOUNTER — Ambulatory Visit: Payer: Medicare Other | Admitting: Physician Assistant

## 2022-07-17 DIAGNOSIS — Z23 Encounter for immunization: Secondary | ICD-10-CM | POA: Diagnosis not present

## 2022-07-17 DIAGNOSIS — M4804 Spinal stenosis, thoracic region: Secondary | ICD-10-CM | POA: Diagnosis not present

## 2022-07-17 DIAGNOSIS — M353 Polymyalgia rheumatica: Secondary | ICD-10-CM | POA: Diagnosis not present

## 2022-07-17 DIAGNOSIS — Z6841 Body Mass Index (BMI) 40.0 and over, adult: Secondary | ICD-10-CM | POA: Diagnosis not present

## 2022-07-17 DIAGNOSIS — M069 Rheumatoid arthritis, unspecified: Secondary | ICD-10-CM | POA: Diagnosis not present

## 2022-07-17 DIAGNOSIS — E876 Hypokalemia: Secondary | ICD-10-CM | POA: Diagnosis not present

## 2022-07-17 DIAGNOSIS — M316 Other giant cell arteritis: Secondary | ICD-10-CM | POA: Diagnosis not present

## 2022-07-17 DIAGNOSIS — E538 Deficiency of other specified B group vitamins: Secondary | ICD-10-CM | POA: Diagnosis not present

## 2022-07-17 DIAGNOSIS — M5136 Other intervertebral disc degeneration, lumbar region: Secondary | ICD-10-CM | POA: Diagnosis not present

## 2022-07-17 DIAGNOSIS — I5032 Chronic diastolic (congestive) heart failure: Secondary | ICD-10-CM | POA: Diagnosis not present

## 2022-07-18 DIAGNOSIS — L97822 Non-pressure chronic ulcer of other part of left lower leg with fat layer exposed: Secondary | ICD-10-CM | POA: Diagnosis not present

## 2022-07-18 DIAGNOSIS — I872 Venous insufficiency (chronic) (peripheral): Secondary | ICD-10-CM | POA: Diagnosis not present

## 2022-07-18 DIAGNOSIS — I5032 Chronic diastolic (congestive) heart failure: Secondary | ICD-10-CM | POA: Diagnosis not present

## 2022-07-18 DIAGNOSIS — M353 Polymyalgia rheumatica: Secondary | ICD-10-CM | POA: Diagnosis not present

## 2022-07-18 DIAGNOSIS — I130 Hypertensive heart and chronic kidney disease with heart failure and stage 1 through stage 4 chronic kidney disease, or unspecified chronic kidney disease: Secondary | ICD-10-CM | POA: Diagnosis not present

## 2022-07-18 DIAGNOSIS — N189 Chronic kidney disease, unspecified: Secondary | ICD-10-CM | POA: Diagnosis not present

## 2022-07-19 NOTE — Progress Notes (Signed)
Office Visit Note  Patient: Nicole [name]             Date of Birth: [birth year]           MRN: [id number]             PCP: Jake Samples, PA-C Referring: Jake Samples, [place]* Visit Date: 08/02/2022 Occupation: '@GUAROCC'$ @  Subjective:  Chronic pain    History of Present Illness: Nicole [name] is a 76 y.o. female with history of rheumatoid arthritis and osteoarthritis.    Patient is currently taking prednisone 10 mg by mouth daily.  She is not currently taking any immunosuppressive agents given history of recurrent infections as well as previous hospitalization for sepsis.  Patient reports that she continues to have chronic pain.  She has migratory arthralgias and states that her pain level has not been adequately controlled.  She is no longer following up with pain management but has been seeing her primary care on a regular basis.  She had a Depo-Medrol IM injection about 2 weeks ago.  Today she is having increased discomfort in the left shoulder joint.  She states in the past she had a left shoulder joint injection which resolved her symptoms for several years.  Patient reports that in the past she felt that her strength was better when working with a physical therapist.  She has been working with an occupational therapy twice a week but has not seen physical therapy since she was initially discharged from the rehab center.  Activities of Daily Living:  Patient reports morning stiffness for all day. Patient Denies nocturnal pain.  Difficulty dressing/grooming: Reports Difficulty climbing stairs: Reports Difficulty getting out of chair: Reports Difficulty using hands for taps, buttons, cutlery, and/or writing: Reports  Review of Systems  Constitutional:  Positive for fatigue.  HENT:  Positive for mouth dryness. Negative for mouth sores.   Eyes:  Negative for dryness.  Respiratory:  Positive for shortness of breath and wheezing.   Cardiovascular:  Positive for swelling in  legs/feet. Negative for chest pain and palpitations.  Gastrointestinal:  Negative for blood in stool, constipation and diarrhea.  Endocrine: Negative for increased urination.  Genitourinary:  Negative for involuntary urination.  Musculoskeletal:  Positive for joint pain, gait problem, joint pain, myalgias, muscle weakness, morning stiffness, muscle tenderness and myalgias. Negative for joint swelling.  Skin:  Positive for color change, rash and hair loss. Negative for sensitivity to sunlight.  Allergic/Immunologic: Negative for susceptible to infections.  Neurological:  Positive for numbness and headaches. Negative for dizziness.  Hematological:  Negative for swollen glands.  Psychiatric/Behavioral:  Negative for depressed mood and sleep disturbance. The patient is nervous/anxious.     PMFS History:  Patient Active Problem List   Diagnosis Date Noted   Encounter for feeding tube placement    Pressure injury of skin 12/21/2021   Acute renal failure (ARF) ([place]) 12/16/2021   Acute urinary retention 12/16/2021   Leg wound, left 12/16/2021   UTI (urinary tract infection) 12/16/2021   Dysphagia    Psoas abscess (HCC) 10/05/2021   Intractable nausea and vomiting 09/24/2021   Myositis 09/16/2021   Psoas abscess, left (2.0 cm on MRI)  09/16/2021   AKI (acute kidney injury) ([place]) 53/61/4431   Acute metabolic encephalopathy 54/00/8676   Hypokalemia 07/20/2021   Elevated MCV 07/20/2021   Mixed hyperlipidemia 07/20/2021   Diverticulitis 05/02/2018   Diverticulitis of colon 05/01/2018   Morbid obesity with BMI of 50.0-59.9, adult ([place]) 05/01/2018  Chronic diastolic HF (heart failure) (HCC) 05/01/2018   On prednisone therapy 05/31/2017   Positive anti-CCP test 05/31/2017   Rheumatoid factor positive 05/31/2017   Memory loss 04/04/2017   Polymyalgia rheumatica ([place]) 04/04/2017   High risk medication use 12/07/2016   DDD (degenerative disc disease), lumbar 09/27/2016   HTN (hypertension)  06/05/2016   Chronic pain syndrome 06/05/2016   Acute diverticulitis 06/02/2016   Diverticulitis large intestine 06/02/2016   Spinal stenosis of lumbosacral region 02/29/2016   Joint pain 02/29/2016   Endometrial polyp 02/06/2014   Postmenopausal bleeding 01/30/2014   GERD (gastroesophageal reflux disease) 09/29/2013   Spinal stenosis, thoracic 09/29/2013   Shingles 09/29/2013   Thoracic or lumbosacral neuritis or radiculitis, unspecified 05/04/2011   Abnormality of gait 05/04/2011   Muscle weakness (generalized) 05/04/2011   Bilateral primary osteoarthritis of knee 04/07/2009   KNEE PAIN 04/07/2009    Past Medical History:  Diagnosis Date   AC (acromioclavicular) joint bone spurs    lt shoulder   Acute kidney failure (HCC)    Anemia    Arthritis    Carpal tunnel syndrome, bilateral    CHF (congestive heart failure) (HCC)    Chronic kidney disease    Chronic pain syndrome    Cognitive communication deficit    Collagen vascular disease ([place])    Dysphagia    Gastroesophageal reflux    Headache    recent visit to ER @ [facility] for severe headache   Hypertension    Lumbar stenosis    Hx of ESIs by Dr. Nelva Bush   Major depressive disorder    Metabolic encephalopathy    Muscle weakness    Polyarthralgia    Polymyalgia ([place])    Shingles    Spinal stenosis     Family History  Problem Relation Age of Onset   Hypertension Mother    Pneumonia Father    Arthritis Sister    Diabetes Paternal Grandmother    Past Surgical History:  Procedure Laterality Date   BACK SURGERY  07/05/2018, 07/2018   x2    BIOPSY  09/22/2020   Procedure: BIOPSY;  Surgeon: Rogene Houston, MD;  [facility];  Service: Endoscopy;;   BIOPSY  10/05/2021   Procedure: BIOPSY;  Surgeon: Harvel Quale, MD;  [facility];  Service: Gastroenterology;;   CHOLECYSTECTOMY     COLONOSCOPY  06/11/2012   Procedure: COLONOSCOPY;  Surgeon: Jamesetta So, MD;  [facility];  Service: Gastroenterology;  Laterality: N/A;   COLONOSCOPY WITH PROPOFOL N/A 09/22/2020   Procedure: COLONOSCOPY WITH PROPOFOL;  Surgeon: Rogene Houston, MD;  [facility];  Service: Endoscopy;  Laterality: N/A;  730   ESOPHAGOGASTRODUODENOSCOPY (EGD) WITH PROPOFOL N/A 10/05/2021   Procedure: ESOPHAGOGASTRODUODENOSCOPY (EGD) WITH PROPOFOL;  Surgeon: Harvel Quale, MD;  [facility];  Service: Gastroenterology;  Laterality: N/A;   HYSTEROSCOPY WITH D & C N/A 02/25/2014   Procedure: DILATATION AND CURETTAGE /HYSTEROSCOPY;  Surgeon: Florian Buff, MD;  Location: [facility];  Service: Gynecology;  Laterality: N/A;   POLYPECTOMY N/A 02/25/2014   Procedure: POLYPECTOMY;  Surgeon: Florian Buff, MD;  Location: [facility];  Service: Gynecology;  Laterality: N/A;   RESECTION DISTAL CLAVICAL Right 03/26/2015   Procedure: OPEN DISTAL CLAVICAL RESECTION ;  Surgeon: Netta Cedars, MD;  Location: [facility];  Service: Orthopedics;  Laterality: Right;   Social History   Social History Narrative   Lives at home alone.   Right-handed.  1-2 cups caffeine daily.   Immunization History  Administered Date(s) Administered   Influenza,inj,quad, With Preservative 07/23/2017   Unspecified SARS-COV-2 Vaccination 06/02/2020, 06/23/2020     Objective: Vital Signs: BP 120/75 (BP Location: Right Arm, Patient Position: Sitting, Cuff Size: Large)   Pulse 67   Resp 19   Ht 5' (1.524 m)   Wt 244 lb 12.8 oz (111 kg) Comment: per patient, patient in wheelchair  BMI 47.81 kg/m    Physical Exam Vitals and nursing note reviewed.  Constitutional:      Appearance: She is well-developed.  HENT:     Head: Normocephalic and atraumatic.  Eyes:     Conjunctiva/sclera: Conjunctivae normal.  Cardiovascular:     Rate and Rhythm: Normal rate and regular rhythm.     Heart sounds: Normal heart sounds.  Pulmonary:     Effort: Pulmonary effort is normal.     Breath sounds: Normal breath sounds.   Abdominal:     General: Bowel sounds are normal.     Palpations: Abdomen is soft.  Musculoskeletal:     Cervical back: Normal range of motion.  Skin:    General: Skin is warm and dry.     Capillary Refill: Capillary refill takes less than 2 seconds.  Neurological:     Mental Status: She is alert and oriented to person, place, and time.  Psychiatric:        Behavior: Behavior normal.      Musculoskeletal Exam: Patient remained in wheelchair during the examination today.  Painful range of motion of both shoulder joints with limited abduction to about 90 degrees.  Elbow joints, wrist joints, MCPs, PIPs, DIPs have good range of motion with no synovitis.  Tenderness over MCP joints noted.  Hip joints difficult to assess in seated position.  Knee joints have good range of motion with some discomfort bilaterally.  Brace on left ankle noted.  Lower extremity edema noted.  CDAI Exam: CDAI Score: -- Patient Global: --; Provider Global: -- Swollen: --; Tender: -- Joint Exam 08/02/2022   No joint exam has been documented for this visit   There is currently no information documented on the homunculus. Go to the Rheumatology activity and complete the homunculus joint exam.  Investigation: No additional findings.  Imaging: No results found.  Recent Labs: Lab Results  Component Value Date   WBC 6.9 02/02/2022   HGB 8.9 (L) 02/02/2022   PLT 220 02/02/2022   NA 142 02/02/2022   K 3.8 02/02/2022   CL 107 02/02/2022   CO2 30 02/02/2022   GLUCOSE 84 02/02/2022   BUN 25 (H) 02/02/2022   CREATININE 1.24 (H) 02/02/2022   BILITOT 0.5 01/11/2022   ALKPHOS 84 01/11/2022   AST 17 01/11/2022   ALT 22 01/11/2022   PROT 4.8 (L) 01/11/2022   ALBUMIN 2.4 (L) 01/20/2022   CALCIUM 8.7 (L) 02/02/2022   GFRAA 44 (L) 01/27/2021   QFTBGOLDPLUS NEGATIVE 09/09/2020    Speciality Comments: PLQ Eye Exam: 12/13/17 WNl @ [facility]  Procedures:  No procedures performed Allergies: Oxycontin  [oxycodone hcl], Sulfa antibiotics, Tramadol, Penicillins, and Sulfasalazine   Assessment / Plan:     Visit Diagnoses: Rheumatoid arthritis involving multiple sites with positive rheumatoid factor (HCC) - Positive RF, positive anti-CCP: She has no active synovitis on examination today.  She remains on prednisone 10 mg daily.  She is not currently taking any other immunosuppressive agents.  She is not a good candidate for DMARDs or biologic agents due  to recurrent infections and hospitalizations.  She continues to have chronic pain involving multiple joints.  She was previously followed by pain management but has been taking off of narcotics.  Patient will be following up with her PCP to discuss pain management options to improve her quality of life.  Referral for home physical therapy will also be placed to try to improve her strength.  She will follow-up on an as-needed basis going forward.  High risk medication use - Prednisone 10 mg by mouth daily.  Darcus Pester and Arava were discontinued due to recurrent hospitalizations and infections.     Polymyalgia rheumatica ([place]): Patient is currently taking prednisone 10 mg daily.  She has had difficulty tapering prednisone due to ongoing arthralgias, myalgias, and muscle fatigue.  Patient remained in the wheelchair during the examination today.  Her mobility is very restricted due to chronic pain in her lower back and muscular deconditioning.  She will benefit from working with a home once her pain level has been adequately controlled.  She plans on following up with her PCP to discuss pain management options.  Temporal arteritis ([place]): No recurrence.  On prednisone therapy: She is taking prednisone 10 mg daily.  She has been unable to taper prednisone less than 10 mg.  According to the patient she had an IM Depo-Medrol injection about 2 weeks ago.  Recurrent infections: She is not a good candidate for DMARDs or biologic agents at this time.  Primary  osteoarthritis of both knees: She continues to have chronic pain in both knee joints.  Her mobility remains limited.  She was in a wheelchair during the examination today.  She may benefit from working with physical therapist at home once her pain level has been controlled.  Spinal stenosis, thoracic: Chronic pain.  Postural thoracic kyphosis.  DDD (degenerative disc disease), lumbar  Chronic pain syndrome -Previously followed by pain management.  She has been taken off of all narcotics.  History of chronic kidney disease - Stage 3b CKD.  Followed by Dr. Theador Hawthorne.  She will be having updated lab work soon ordered by Dr. Theador Hawthorne.  Other medical conditions are listed as follows:  Iliopsoas abscess ([place]) - Requiring hospitalization. Discharged from [facility] rehab center.   Ulcer of left lower extremity, unspecified ulcer stage (HCC)  Vitamin D deficiency  History of gastroesophageal reflux (GERD)  History of diverticulitis  Colonic ulcer - Evident on colonoscopy 09/22/2021. Followed by Dr. Laural Golden. Nonbleeding ulcer noted at splenic flexure.   Memory loss  History of hypertension: BP was 120/75 today in the office.   Orders: No orders of the defined types were placed in this encounter.  No orders of the defined types were placed in this encounter.   Follow-Up Instructions: Return if symptoms worsen or fail to improve, for Rheumatoid arthritis, Polymyalgia Rheumatica, Osteoarthritis.   Ofilia Neas, PA-C  Note - This record has been created using Dragon software.  Chart creation errors have been sought, but may not always  have been located. Such creation errors do not reflect on  the standard of medical care.

## 2022-07-20 DIAGNOSIS — N189 Chronic kidney disease, unspecified: Secondary | ICD-10-CM | POA: Diagnosis not present

## 2022-07-20 DIAGNOSIS — I130 Hypertensive heart and chronic kidney disease with heart failure and stage 1 through stage 4 chronic kidney disease, or unspecified chronic kidney disease: Secondary | ICD-10-CM | POA: Diagnosis not present

## 2022-07-20 DIAGNOSIS — I5032 Chronic diastolic (congestive) heart failure: Secondary | ICD-10-CM | POA: Diagnosis not present

## 2022-07-20 DIAGNOSIS — I872 Venous insufficiency (chronic) (peripheral): Secondary | ICD-10-CM | POA: Diagnosis not present

## 2022-07-20 DIAGNOSIS — M353 Polymyalgia rheumatica: Secondary | ICD-10-CM | POA: Diagnosis not present

## 2022-07-20 DIAGNOSIS — L97822 Non-pressure chronic ulcer of other part of left lower leg with fat layer exposed: Secondary | ICD-10-CM | POA: Diagnosis not present

## 2022-07-26 DIAGNOSIS — M353 Polymyalgia rheumatica: Secondary | ICD-10-CM | POA: Diagnosis not present

## 2022-07-26 DIAGNOSIS — N189 Chronic kidney disease, unspecified: Secondary | ICD-10-CM | POA: Diagnosis not present

## 2022-07-26 DIAGNOSIS — I130 Hypertensive heart and chronic kidney disease with heart failure and stage 1 through stage 4 chronic kidney disease, or unspecified chronic kidney disease: Secondary | ICD-10-CM | POA: Diagnosis not present

## 2022-07-26 DIAGNOSIS — I872 Venous insufficiency (chronic) (peripheral): Secondary | ICD-10-CM | POA: Diagnosis not present

## 2022-07-26 DIAGNOSIS — I5032 Chronic diastolic (congestive) heart failure: Secondary | ICD-10-CM | POA: Diagnosis not present

## 2022-07-26 DIAGNOSIS — L97822 Non-pressure chronic ulcer of other part of left lower leg with fat layer exposed: Secondary | ICD-10-CM | POA: Diagnosis not present

## 2022-07-27 DIAGNOSIS — E211 Secondary hyperparathyroidism, not elsewhere classified: Secondary | ICD-10-CM | POA: Diagnosis not present

## 2022-07-27 DIAGNOSIS — R338 Other retention of urine: Secondary | ICD-10-CM | POA: Diagnosis not present

## 2022-07-27 DIAGNOSIS — D638 Anemia in other chronic diseases classified elsewhere: Secondary | ICD-10-CM | POA: Diagnosis not present

## 2022-07-27 DIAGNOSIS — N170 Acute kidney failure with tubular necrosis: Secondary | ICD-10-CM | POA: Diagnosis not present

## 2022-07-27 DIAGNOSIS — N184 Chronic kidney disease, stage 4 (severe): Secondary | ICD-10-CM | POA: Diagnosis not present

## 2022-07-27 DIAGNOSIS — I129 Hypertensive chronic kidney disease with stage 1 through stage 4 chronic kidney disease, or unspecified chronic kidney disease: Secondary | ICD-10-CM | POA: Diagnosis not present

## 2022-07-27 DIAGNOSIS — I5032 Chronic diastolic (congestive) heart failure: Secondary | ICD-10-CM | POA: Diagnosis not present

## 2022-07-30 DIAGNOSIS — M353 Polymyalgia rheumatica: Secondary | ICD-10-CM | POA: Diagnosis not present

## 2022-07-30 DIAGNOSIS — R32 Unspecified urinary incontinence: Secondary | ICD-10-CM | POA: Diagnosis not present

## 2022-07-30 DIAGNOSIS — K219 Gastro-esophageal reflux disease without esophagitis: Secondary | ICD-10-CM | POA: Diagnosis not present

## 2022-07-30 DIAGNOSIS — Z791 Long term (current) use of non-steroidal anti-inflammatories (NSAID): Secondary | ICD-10-CM | POA: Diagnosis not present

## 2022-07-30 DIAGNOSIS — Z87440 Personal history of urinary (tract) infections: Secondary | ICD-10-CM | POA: Diagnosis not present

## 2022-07-30 DIAGNOSIS — Z6841 Body Mass Index (BMI) 40.0 and over, adult: Secondary | ICD-10-CM | POA: Diagnosis not present

## 2022-07-30 DIAGNOSIS — Z7952 Long term (current) use of systemic steroids: Secondary | ICD-10-CM | POA: Diagnosis not present

## 2022-07-30 DIAGNOSIS — I5032 Chronic diastolic (congestive) heart failure: Secondary | ICD-10-CM | POA: Diagnosis not present

## 2022-07-30 DIAGNOSIS — N189 Chronic kidney disease, unspecified: Secondary | ICD-10-CM | POA: Diagnosis not present

## 2022-07-30 DIAGNOSIS — M4800 Spinal stenosis, site unspecified: Secondary | ICD-10-CM | POA: Diagnosis not present

## 2022-07-30 DIAGNOSIS — I130 Hypertensive heart and chronic kidney disease with heart failure and stage 1 through stage 4 chronic kidney disease, or unspecified chronic kidney disease: Secondary | ICD-10-CM | POA: Diagnosis not present

## 2022-07-30 DIAGNOSIS — L97822 Non-pressure chronic ulcer of other part of left lower leg with fat layer exposed: Secondary | ICD-10-CM | POA: Diagnosis not present

## 2022-07-30 DIAGNOSIS — G8929 Other chronic pain: Secondary | ICD-10-CM | POA: Diagnosis not present

## 2022-07-30 DIAGNOSIS — I739 Peripheral vascular disease, unspecified: Secondary | ICD-10-CM | POA: Diagnosis not present

## 2022-07-30 DIAGNOSIS — R1310 Dysphagia, unspecified: Secondary | ICD-10-CM | POA: Diagnosis not present

## 2022-07-30 DIAGNOSIS — I872 Venous insufficiency (chronic) (peripheral): Secondary | ICD-10-CM | POA: Diagnosis not present

## 2022-08-01 DIAGNOSIS — I130 Hypertensive heart and chronic kidney disease with heart failure and stage 1 through stage 4 chronic kidney disease, or unspecified chronic kidney disease: Secondary | ICD-10-CM | POA: Diagnosis not present

## 2022-08-01 DIAGNOSIS — I872 Venous insufficiency (chronic) (peripheral): Secondary | ICD-10-CM | POA: Diagnosis not present

## 2022-08-01 DIAGNOSIS — L97822 Non-pressure chronic ulcer of other part of left lower leg with fat layer exposed: Secondary | ICD-10-CM | POA: Diagnosis not present

## 2022-08-01 DIAGNOSIS — I5032 Chronic diastolic (congestive) heart failure: Secondary | ICD-10-CM | POA: Diagnosis not present

## 2022-08-02 ENCOUNTER — Encounter: Payer: Self-pay | Admitting: Physician Assistant

## 2022-08-02 ENCOUNTER — Ambulatory Visit: Payer: Medicare Other | Attending: Physician Assistant | Admitting: Physician Assistant

## 2022-08-02 VITALS — BP 120/75 | HR 67 | Resp 19 | Ht 60.0 in | Wt 244.8 lb

## 2022-08-02 DIAGNOSIS — M5136 Other intervertebral disc degeneration, lumbar region: Secondary | ICD-10-CM | POA: Diagnosis not present

## 2022-08-02 DIAGNOSIS — Z79899 Other long term (current) drug therapy: Secondary | ICD-10-CM | POA: Insufficient documentation

## 2022-08-02 DIAGNOSIS — B999 Unspecified infectious disease: Secondary | ICD-10-CM | POA: Insufficient documentation

## 2022-08-02 DIAGNOSIS — Z7952 Long term (current) use of systemic steroids: Secondary | ICD-10-CM | POA: Insufficient documentation

## 2022-08-02 DIAGNOSIS — Z87448 Personal history of other diseases of urinary system: Secondary | ICD-10-CM | POA: Diagnosis not present

## 2022-08-02 DIAGNOSIS — Z8719 Personal history of other diseases of the digestive system: Secondary | ICD-10-CM | POA: Diagnosis not present

## 2022-08-02 DIAGNOSIS — G894 Chronic pain syndrome: Secondary | ICD-10-CM | POA: Diagnosis not present

## 2022-08-02 DIAGNOSIS — E559 Vitamin D deficiency, unspecified: Secondary | ICD-10-CM | POA: Insufficient documentation

## 2022-08-02 DIAGNOSIS — K6812 Psoas muscle abscess: Secondary | ICD-10-CM | POA: Diagnosis not present

## 2022-08-02 DIAGNOSIS — M170 Bilateral primary osteoarthritis of knee: Secondary | ICD-10-CM | POA: Insufficient documentation

## 2022-08-02 DIAGNOSIS — K633 Ulcer of intestine: Secondary | ICD-10-CM | POA: Diagnosis not present

## 2022-08-02 DIAGNOSIS — R413 Other amnesia: Secondary | ICD-10-CM | POA: Insufficient documentation

## 2022-08-02 DIAGNOSIS — L97929 Non-pressure chronic ulcer of unspecified part of left lower leg with unspecified severity: Secondary | ICD-10-CM | POA: Insufficient documentation

## 2022-08-02 DIAGNOSIS — Z8679 Personal history of other diseases of the circulatory system: Secondary | ICD-10-CM | POA: Insufficient documentation

## 2022-08-02 DIAGNOSIS — M0579 Rheumatoid arthritis with rheumatoid factor of multiple sites without organ or systems involvement: Secondary | ICD-10-CM | POA: Diagnosis not present

## 2022-08-02 DIAGNOSIS — M353 Polymyalgia rheumatica: Secondary | ICD-10-CM | POA: Insufficient documentation

## 2022-08-02 DIAGNOSIS — M4804 Spinal stenosis, thoracic region: Secondary | ICD-10-CM | POA: Diagnosis not present

## 2022-08-02 DIAGNOSIS — M316 Other giant cell arteritis: Secondary | ICD-10-CM | POA: Diagnosis not present

## 2022-08-02 NOTE — Addendum Note (Signed)
Addended by: Earnestine Mealing on: 08/02/2022 03:30 PM   Modules accepted: Orders

## 2022-08-03 DIAGNOSIS — N189 Chronic kidney disease, unspecified: Secondary | ICD-10-CM | POA: Diagnosis not present

## 2022-08-03 DIAGNOSIS — M353 Polymyalgia rheumatica: Secondary | ICD-10-CM | POA: Diagnosis not present

## 2022-08-03 DIAGNOSIS — I872 Venous insufficiency (chronic) (peripheral): Secondary | ICD-10-CM | POA: Diagnosis not present

## 2022-08-03 DIAGNOSIS — L97822 Non-pressure chronic ulcer of other part of left lower leg with fat layer exposed: Secondary | ICD-10-CM | POA: Diagnosis not present

## 2022-08-03 DIAGNOSIS — I5032 Chronic diastolic (congestive) heart failure: Secondary | ICD-10-CM | POA: Diagnosis not present

## 2022-08-03 DIAGNOSIS — I130 Hypertensive heart and chronic kidney disease with heart failure and stage 1 through stage 4 chronic kidney disease, or unspecified chronic kidney disease: Secondary | ICD-10-CM | POA: Diagnosis not present

## 2022-08-08 DIAGNOSIS — I130 Hypertensive heart and chronic kidney disease with heart failure and stage 1 through stage 4 chronic kidney disease, or unspecified chronic kidney disease: Secondary | ICD-10-CM | POA: Diagnosis not present

## 2022-08-08 DIAGNOSIS — L97822 Non-pressure chronic ulcer of other part of left lower leg with fat layer exposed: Secondary | ICD-10-CM | POA: Diagnosis not present

## 2022-08-08 DIAGNOSIS — M353 Polymyalgia rheumatica: Secondary | ICD-10-CM | POA: Diagnosis not present

## 2022-08-08 DIAGNOSIS — I5032 Chronic diastolic (congestive) heart failure: Secondary | ICD-10-CM | POA: Diagnosis not present

## 2022-08-08 DIAGNOSIS — N189 Chronic kidney disease, unspecified: Secondary | ICD-10-CM | POA: Diagnosis not present

## 2022-08-08 DIAGNOSIS — I872 Venous insufficiency (chronic) (peripheral): Secondary | ICD-10-CM | POA: Diagnosis not present

## 2022-08-10 ENCOUNTER — Telehealth: Payer: Self-pay | Admitting: *Deleted

## 2022-08-10 NOTE — Chronic Care Management (AMB) (Signed)
  Care Coordination  Outreach Note  08/10/2022 Name: Nicole [name] MRN: [id number] DOB: Jan 03, 1946   Care Coordination Outreach Attempts: An unsuccessful telephone outreach was attempted today to offer the patient information about available care coordination services as a benefit of their health plan.   Follow Up Plan:  Additional outreach attempts will be made to offer the patient care coordination information and services.   Encounter Outcome:  No Answer  [place]  Direct Dial: [phone]

## 2022-08-16 NOTE — Chronic Care Management (AMB) (Signed)
  Care Coordination  Outreach Note  08/16/2022 Name: Nicole [name] MRN: [id number] DOB: Dec 09, 1945   Care Coordination Outreach Attempts: A second unsuccessful outreach was attempted today to offer the patient with information about available care coordination services as a benefit of their health plan.     Follow Up Plan:  Additional outreach attempts will be made to offer the patient care coordination information and services.   Encounter Outcome:  No Answer  [place]  Direct Dial: [phone]

## 2022-08-23 DIAGNOSIS — K6812 Psoas muscle abscess: Secondary | ICD-10-CM | POA: Diagnosis not present

## 2022-08-23 DIAGNOSIS — E782 Mixed hyperlipidemia: Secondary | ICD-10-CM | POA: Diagnosis not present

## 2022-08-23 DIAGNOSIS — K219 Gastro-esophageal reflux disease without esophagitis: Secondary | ICD-10-CM | POA: Diagnosis not present

## 2022-08-23 DIAGNOSIS — I130 Hypertensive heart and chronic kidney disease with heart failure and stage 1 through stage 4 chronic kidney disease, or unspecified chronic kidney disease: Secondary | ICD-10-CM | POA: Diagnosis not present

## 2022-08-23 DIAGNOSIS — M4804 Spinal stenosis, thoracic region: Secondary | ICD-10-CM | POA: Diagnosis not present

## 2022-08-23 DIAGNOSIS — I5032 Chronic diastolic (congestive) heart failure: Secondary | ICD-10-CM | POA: Diagnosis not present

## 2022-08-23 DIAGNOSIS — E559 Vitamin D deficiency, unspecified: Secondary | ICD-10-CM | POA: Diagnosis not present

## 2022-08-23 DIAGNOSIS — R1310 Dysphagia, unspecified: Secondary | ICD-10-CM | POA: Diagnosis not present

## 2022-08-23 DIAGNOSIS — M359 Systemic involvement of connective tissue, unspecified: Secondary | ICD-10-CM | POA: Diagnosis not present

## 2022-08-23 DIAGNOSIS — M353 Polymyalgia rheumatica: Secondary | ICD-10-CM | POA: Diagnosis not present

## 2022-08-23 DIAGNOSIS — Z87440 Personal history of urinary (tract) infections: Secondary | ICD-10-CM | POA: Diagnosis not present

## 2022-08-23 DIAGNOSIS — M316 Other giant cell arteritis: Secondary | ICD-10-CM | POA: Diagnosis not present

## 2022-08-23 DIAGNOSIS — M5136 Other intervertebral disc degeneration, lumbar region: Secondary | ICD-10-CM | POA: Diagnosis not present

## 2022-08-23 DIAGNOSIS — Z8719 Personal history of other diseases of the digestive system: Secondary | ICD-10-CM | POA: Diagnosis not present

## 2022-08-23 DIAGNOSIS — Z9181 History of falling: Secondary | ICD-10-CM | POA: Diagnosis not present

## 2022-08-23 DIAGNOSIS — G894 Chronic pain syndrome: Secondary | ICD-10-CM | POA: Diagnosis not present

## 2022-08-23 DIAGNOSIS — N1832 Chronic kidney disease, stage 3b: Secondary | ICD-10-CM | POA: Diagnosis not present

## 2022-08-23 DIAGNOSIS — M170 Bilateral primary osteoarthritis of knee: Secondary | ICD-10-CM | POA: Diagnosis not present

## 2022-08-23 DIAGNOSIS — Z993 Dependence on wheelchair: Secondary | ICD-10-CM | POA: Diagnosis not present

## 2022-08-23 DIAGNOSIS — Z7952 Long term (current) use of systemic steroids: Secondary | ICD-10-CM | POA: Diagnosis not present

## 2022-08-23 DIAGNOSIS — M0579 Rheumatoid arthritis with rheumatoid factor of multiple sites without organ or systems involvement: Secondary | ICD-10-CM | POA: Diagnosis not present

## 2022-08-23 DIAGNOSIS — G5603 Carpal tunnel syndrome, bilateral upper limbs: Secondary | ICD-10-CM | POA: Diagnosis not present

## 2022-08-23 DIAGNOSIS — D631 Anemia in chronic kidney disease: Secondary | ICD-10-CM | POA: Diagnosis not present

## 2022-08-23 NOTE — Chronic Care Management (AMB) (Signed)
  Care Coordination  Outreach Note  08/23/2022 Name: Nicole [name] MRN: [id number] DOB: 18-Jun-1946   Care Coordination Outreach Attempts: A third unsuccessful outreach was attempted today to offer the patient with information about available care coordination services as a benefit of their health plan.   Follow Up Plan:  No further outreach attempts will be made at this time. We have been unable to contact the patient to offer or enroll patient in care coordination services  Encounter Outcome:  No Answer  [place]: [phone]

## 2022-09-01 DIAGNOSIS — I872 Venous insufficiency (chronic) (peripheral): Secondary | ICD-10-CM | POA: Diagnosis not present

## 2022-09-01 DIAGNOSIS — Z1331 Encounter for screening for depression: Secondary | ICD-10-CM | POA: Diagnosis not present

## 2022-09-01 DIAGNOSIS — I5032 Chronic diastolic (congestive) heart failure: Secondary | ICD-10-CM | POA: Diagnosis not present

## 2022-09-01 DIAGNOSIS — M170 Bilateral primary osteoarthritis of knee: Secondary | ICD-10-CM | POA: Diagnosis not present

## 2022-09-01 DIAGNOSIS — Z6841 Body Mass Index (BMI) 40.0 and over, adult: Secondary | ICD-10-CM | POA: Diagnosis not present

## 2022-09-01 DIAGNOSIS — M0579 Rheumatoid arthritis with rheumatoid factor of multiple sites without organ or systems involvement: Secondary | ICD-10-CM | POA: Diagnosis not present

## 2022-09-01 DIAGNOSIS — Z0001 Encounter for general adult medical examination with abnormal findings: Secondary | ICD-10-CM | POA: Diagnosis not present

## 2022-09-01 DIAGNOSIS — M48061 Spinal stenosis, lumbar region without neurogenic claudication: Secondary | ICD-10-CM | POA: Diagnosis not present

## 2022-09-01 DIAGNOSIS — M353 Polymyalgia rheumatica: Secondary | ICD-10-CM | POA: Diagnosis not present

## 2022-09-01 DIAGNOSIS — M069 Rheumatoid arthritis, unspecified: Secondary | ICD-10-CM | POA: Diagnosis not present

## 2022-09-01 DIAGNOSIS — N1832 Chronic kidney disease, stage 3b: Secondary | ICD-10-CM | POA: Diagnosis not present

## 2022-09-01 DIAGNOSIS — I130 Hypertensive heart and chronic kidney disease with heart failure and stage 1 through stage 4 chronic kidney disease, or unspecified chronic kidney disease: Secondary | ICD-10-CM | POA: Diagnosis not present

## 2022-09-03 ENCOUNTER — Encounter (INDEPENDENT_AMBULATORY_CARE_PROVIDER_SITE_OTHER): Payer: Self-pay | Admitting: Gastroenterology

## 2022-09-05 DIAGNOSIS — N1832 Chronic kidney disease, stage 3b: Secondary | ICD-10-CM | POA: Diagnosis not present

## 2022-09-05 DIAGNOSIS — M353 Polymyalgia rheumatica: Secondary | ICD-10-CM | POA: Diagnosis not present

## 2022-09-05 DIAGNOSIS — M170 Bilateral primary osteoarthritis of knee: Secondary | ICD-10-CM | POA: Diagnosis not present

## 2022-09-05 DIAGNOSIS — I5032 Chronic diastolic (congestive) heart failure: Secondary | ICD-10-CM | POA: Diagnosis not present

## 2022-09-05 DIAGNOSIS — M0579 Rheumatoid arthritis with rheumatoid factor of multiple sites without organ or systems involvement: Secondary | ICD-10-CM | POA: Diagnosis not present

## 2022-09-05 DIAGNOSIS — I130 Hypertensive heart and chronic kidney disease with heart failure and stage 1 through stage 4 chronic kidney disease, or unspecified chronic kidney disease: Secondary | ICD-10-CM | POA: Diagnosis not present

## 2022-09-07 DIAGNOSIS — M353 Polymyalgia rheumatica: Secondary | ICD-10-CM | POA: Diagnosis not present

## 2022-09-07 DIAGNOSIS — M0579 Rheumatoid arthritis with rheumatoid factor of multiple sites without organ or systems involvement: Secondary | ICD-10-CM | POA: Diagnosis not present

## 2022-09-07 DIAGNOSIS — N1832 Chronic kidney disease, stage 3b: Secondary | ICD-10-CM | POA: Diagnosis not present

## 2022-09-07 DIAGNOSIS — I130 Hypertensive heart and chronic kidney disease with heart failure and stage 1 through stage 4 chronic kidney disease, or unspecified chronic kidney disease: Secondary | ICD-10-CM | POA: Diagnosis not present

## 2022-09-07 DIAGNOSIS — M170 Bilateral primary osteoarthritis of knee: Secondary | ICD-10-CM | POA: Diagnosis not present

## 2022-09-07 DIAGNOSIS — I5032 Chronic diastolic (congestive) heart failure: Secondary | ICD-10-CM | POA: Diagnosis not present

## 2022-09-11 DIAGNOSIS — M0579 Rheumatoid arthritis with rheumatoid factor of multiple sites without organ or systems involvement: Secondary | ICD-10-CM | POA: Diagnosis not present

## 2022-09-11 DIAGNOSIS — M170 Bilateral primary osteoarthritis of knee: Secondary | ICD-10-CM | POA: Diagnosis not present

## 2022-09-11 DIAGNOSIS — N1832 Chronic kidney disease, stage 3b: Secondary | ICD-10-CM | POA: Diagnosis not present

## 2022-09-11 DIAGNOSIS — I5032 Chronic diastolic (congestive) heart failure: Secondary | ICD-10-CM | POA: Diagnosis not present

## 2022-09-11 DIAGNOSIS — M353 Polymyalgia rheumatica: Secondary | ICD-10-CM | POA: Diagnosis not present

## 2022-09-11 DIAGNOSIS — I130 Hypertensive heart and chronic kidney disease with heart failure and stage 1 through stage 4 chronic kidney disease, or unspecified chronic kidney disease: Secondary | ICD-10-CM | POA: Diagnosis not present

## 2022-09-12 DIAGNOSIS — I130 Hypertensive heart and chronic kidney disease with heart failure and stage 1 through stage 4 chronic kidney disease, or unspecified chronic kidney disease: Secondary | ICD-10-CM | POA: Diagnosis not present

## 2022-09-12 DIAGNOSIS — M170 Bilateral primary osteoarthritis of knee: Secondary | ICD-10-CM | POA: Diagnosis not present

## 2022-09-12 DIAGNOSIS — M0579 Rheumatoid arthritis with rheumatoid factor of multiple sites without organ or systems involvement: Secondary | ICD-10-CM | POA: Diagnosis not present

## 2022-09-12 DIAGNOSIS — M353 Polymyalgia rheumatica: Secondary | ICD-10-CM | POA: Diagnosis not present

## 2022-09-12 DIAGNOSIS — I5032 Chronic diastolic (congestive) heart failure: Secondary | ICD-10-CM | POA: Diagnosis not present

## 2022-09-12 DIAGNOSIS — N1832 Chronic kidney disease, stage 3b: Secondary | ICD-10-CM | POA: Diagnosis not present

## 2022-09-13 DIAGNOSIS — M170 Bilateral primary osteoarthritis of knee: Secondary | ICD-10-CM | POA: Diagnosis not present

## 2022-09-13 DIAGNOSIS — M353 Polymyalgia rheumatica: Secondary | ICD-10-CM | POA: Diagnosis not present

## 2022-09-13 DIAGNOSIS — R338 Other retention of urine: Secondary | ICD-10-CM | POA: Diagnosis not present

## 2022-09-13 DIAGNOSIS — E211 Secondary hyperparathyroidism, not elsewhere classified: Secondary | ICD-10-CM | POA: Diagnosis not present

## 2022-09-13 DIAGNOSIS — I130 Hypertensive heart and chronic kidney disease with heart failure and stage 1 through stage 4 chronic kidney disease, or unspecified chronic kidney disease: Secondary | ICD-10-CM | POA: Diagnosis not present

## 2022-09-13 DIAGNOSIS — I5032 Chronic diastolic (congestive) heart failure: Secondary | ICD-10-CM | POA: Diagnosis not present

## 2022-09-13 DIAGNOSIS — N184 Chronic kidney disease, stage 4 (severe): Secondary | ICD-10-CM | POA: Diagnosis not present

## 2022-09-13 DIAGNOSIS — N1832 Chronic kidney disease, stage 3b: Secondary | ICD-10-CM | POA: Diagnosis not present

## 2022-09-13 DIAGNOSIS — D638 Anemia in other chronic diseases classified elsewhere: Secondary | ICD-10-CM | POA: Diagnosis not present

## 2022-09-13 DIAGNOSIS — M0579 Rheumatoid arthritis with rheumatoid factor of multiple sites without organ or systems involvement: Secondary | ICD-10-CM | POA: Diagnosis not present

## 2022-09-13 DIAGNOSIS — I129 Hypertensive chronic kidney disease with stage 1 through stage 4 chronic kidney disease, or unspecified chronic kidney disease: Secondary | ICD-10-CM | POA: Diagnosis not present

## 2022-09-18 DIAGNOSIS — M0579 Rheumatoid arthritis with rheumatoid factor of multiple sites without organ or systems involvement: Secondary | ICD-10-CM | POA: Diagnosis not present

## 2022-09-18 DIAGNOSIS — M353 Polymyalgia rheumatica: Secondary | ICD-10-CM | POA: Diagnosis not present

## 2022-09-18 DIAGNOSIS — N1832 Chronic kidney disease, stage 3b: Secondary | ICD-10-CM | POA: Diagnosis not present

## 2022-09-18 DIAGNOSIS — I5032 Chronic diastolic (congestive) heart failure: Secondary | ICD-10-CM | POA: Diagnosis not present

## 2022-09-18 DIAGNOSIS — M170 Bilateral primary osteoarthritis of knee: Secondary | ICD-10-CM | POA: Diagnosis not present

## 2022-09-18 DIAGNOSIS — I130 Hypertensive heart and chronic kidney disease with heart failure and stage 1 through stage 4 chronic kidney disease, or unspecified chronic kidney disease: Secondary | ICD-10-CM | POA: Diagnosis not present

## 2022-09-20 DIAGNOSIS — N1832 Chronic kidney disease, stage 3b: Secondary | ICD-10-CM | POA: Diagnosis not present

## 2022-09-20 DIAGNOSIS — I5032 Chronic diastolic (congestive) heart failure: Secondary | ICD-10-CM | POA: Diagnosis not present

## 2022-09-20 DIAGNOSIS — I130 Hypertensive heart and chronic kidney disease with heart failure and stage 1 through stage 4 chronic kidney disease, or unspecified chronic kidney disease: Secondary | ICD-10-CM | POA: Diagnosis not present

## 2022-09-20 DIAGNOSIS — M170 Bilateral primary osteoarthritis of knee: Secondary | ICD-10-CM | POA: Diagnosis not present

## 2022-09-20 DIAGNOSIS — M353 Polymyalgia rheumatica: Secondary | ICD-10-CM | POA: Diagnosis not present

## 2022-09-20 DIAGNOSIS — M0579 Rheumatoid arthritis with rheumatoid factor of multiple sites without organ or systems involvement: Secondary | ICD-10-CM | POA: Diagnosis not present

## 2022-09-22 DIAGNOSIS — E559 Vitamin D deficiency, unspecified: Secondary | ICD-10-CM | POA: Diagnosis not present

## 2022-09-22 DIAGNOSIS — Z8719 Personal history of other diseases of the digestive system: Secondary | ICD-10-CM | POA: Diagnosis not present

## 2022-09-22 DIAGNOSIS — N1832 Chronic kidney disease, stage 3b: Secondary | ICD-10-CM | POA: Diagnosis not present

## 2022-09-22 DIAGNOSIS — K6812 Psoas muscle abscess: Secondary | ICD-10-CM | POA: Diagnosis not present

## 2022-09-22 DIAGNOSIS — G5603 Carpal tunnel syndrome, bilateral upper limbs: Secondary | ICD-10-CM | POA: Diagnosis not present

## 2022-09-22 DIAGNOSIS — I5032 Chronic diastolic (congestive) heart failure: Secondary | ICD-10-CM | POA: Diagnosis not present

## 2022-09-22 DIAGNOSIS — D631 Anemia in chronic kidney disease: Secondary | ICD-10-CM | POA: Diagnosis not present

## 2022-09-22 DIAGNOSIS — M359 Systemic involvement of connective tissue, unspecified: Secondary | ICD-10-CM | POA: Diagnosis not present

## 2022-09-22 DIAGNOSIS — M4804 Spinal stenosis, thoracic region: Secondary | ICD-10-CM | POA: Diagnosis not present

## 2022-09-22 DIAGNOSIS — M316 Other giant cell arteritis: Secondary | ICD-10-CM | POA: Diagnosis not present

## 2022-09-22 DIAGNOSIS — M0579 Rheumatoid arthritis with rheumatoid factor of multiple sites without organ or systems involvement: Secondary | ICD-10-CM | POA: Diagnosis not present

## 2022-09-22 DIAGNOSIS — R1310 Dysphagia, unspecified: Secondary | ICD-10-CM | POA: Diagnosis not present

## 2022-09-22 DIAGNOSIS — Z7952 Long term (current) use of systemic steroids: Secondary | ICD-10-CM | POA: Diagnosis not present

## 2022-09-22 DIAGNOSIS — Z87440 Personal history of urinary (tract) infections: Secondary | ICD-10-CM | POA: Diagnosis not present

## 2022-09-22 DIAGNOSIS — M353 Polymyalgia rheumatica: Secondary | ICD-10-CM | POA: Diagnosis not present

## 2022-09-22 DIAGNOSIS — Z9181 History of falling: Secondary | ICD-10-CM | POA: Diagnosis not present

## 2022-09-22 DIAGNOSIS — I130 Hypertensive heart and chronic kidney disease with heart failure and stage 1 through stage 4 chronic kidney disease, or unspecified chronic kidney disease: Secondary | ICD-10-CM | POA: Diagnosis not present

## 2022-09-22 DIAGNOSIS — Z993 Dependence on wheelchair: Secondary | ICD-10-CM | POA: Diagnosis not present

## 2022-09-22 DIAGNOSIS — G894 Chronic pain syndrome: Secondary | ICD-10-CM | POA: Diagnosis not present

## 2022-09-22 DIAGNOSIS — M5136 Other intervertebral disc degeneration, lumbar region: Secondary | ICD-10-CM | POA: Diagnosis not present

## 2022-09-22 DIAGNOSIS — K219 Gastro-esophageal reflux disease without esophagitis: Secondary | ICD-10-CM | POA: Diagnosis not present

## 2022-09-22 DIAGNOSIS — M170 Bilateral primary osteoarthritis of knee: Secondary | ICD-10-CM | POA: Diagnosis not present

## 2022-09-22 DIAGNOSIS — E782 Mixed hyperlipidemia: Secondary | ICD-10-CM | POA: Diagnosis not present

## 2022-09-23 DIAGNOSIS — N184 Chronic kidney disease, stage 4 (severe): Secondary | ICD-10-CM | POA: Diagnosis not present

## 2022-09-23 DIAGNOSIS — R809 Proteinuria, unspecified: Secondary | ICD-10-CM | POA: Diagnosis not present

## 2022-09-23 DIAGNOSIS — N170 Acute kidney failure with tubular necrosis: Secondary | ICD-10-CM | POA: Diagnosis not present

## 2022-09-23 DIAGNOSIS — Z6841 Body Mass Index (BMI) 40.0 and over, adult: Secondary | ICD-10-CM | POA: Diagnosis not present

## 2022-09-23 DIAGNOSIS — I5032 Chronic diastolic (congestive) heart failure: Secondary | ICD-10-CM | POA: Diagnosis not present

## 2022-09-23 DIAGNOSIS — R338 Other retention of urine: Secondary | ICD-10-CM | POA: Diagnosis not present

## 2022-09-23 DIAGNOSIS — I129 Hypertensive chronic kidney disease with stage 1 through stage 4 chronic kidney disease, or unspecified chronic kidney disease: Secondary | ICD-10-CM | POA: Diagnosis not present

## 2022-09-23 DIAGNOSIS — E211 Secondary hyperparathyroidism, not elsewhere classified: Secondary | ICD-10-CM | POA: Diagnosis not present

## 2022-09-25 DIAGNOSIS — M0579 Rheumatoid arthritis with rheumatoid factor of multiple sites without organ or systems involvement: Secondary | ICD-10-CM | POA: Diagnosis not present

## 2022-09-25 DIAGNOSIS — M353 Polymyalgia rheumatica: Secondary | ICD-10-CM | POA: Diagnosis not present

## 2022-09-25 DIAGNOSIS — I5032 Chronic diastolic (congestive) heart failure: Secondary | ICD-10-CM | POA: Diagnosis not present

## 2022-09-25 DIAGNOSIS — M170 Bilateral primary osteoarthritis of knee: Secondary | ICD-10-CM | POA: Diagnosis not present

## 2022-09-25 DIAGNOSIS — I130 Hypertensive heart and chronic kidney disease with heart failure and stage 1 through stage 4 chronic kidney disease, or unspecified chronic kidney disease: Secondary | ICD-10-CM | POA: Diagnosis not present

## 2022-09-25 DIAGNOSIS — N1832 Chronic kidney disease, stage 3b: Secondary | ICD-10-CM | POA: Diagnosis not present

## 2022-09-26 DIAGNOSIS — I130 Hypertensive heart and chronic kidney disease with heart failure and stage 1 through stage 4 chronic kidney disease, or unspecified chronic kidney disease: Secondary | ICD-10-CM | POA: Diagnosis not present

## 2022-09-26 DIAGNOSIS — M0579 Rheumatoid arthritis with rheumatoid factor of multiple sites without organ or systems involvement: Secondary | ICD-10-CM | POA: Diagnosis not present

## 2022-09-26 DIAGNOSIS — M353 Polymyalgia rheumatica: Secondary | ICD-10-CM | POA: Diagnosis not present

## 2022-09-26 DIAGNOSIS — N1832 Chronic kidney disease, stage 3b: Secondary | ICD-10-CM | POA: Diagnosis not present

## 2022-09-26 DIAGNOSIS — I5032 Chronic diastolic (congestive) heart failure: Secondary | ICD-10-CM | POA: Diagnosis not present

## 2022-09-26 DIAGNOSIS — M170 Bilateral primary osteoarthritis of knee: Secondary | ICD-10-CM | POA: Diagnosis not present

## 2022-10-02 DIAGNOSIS — N1832 Chronic kidney disease, stage 3b: Secondary | ICD-10-CM | POA: Diagnosis not present

## 2022-10-02 DIAGNOSIS — M170 Bilateral primary osteoarthritis of knee: Secondary | ICD-10-CM | POA: Diagnosis not present

## 2022-10-02 DIAGNOSIS — M353 Polymyalgia rheumatica: Secondary | ICD-10-CM | POA: Diagnosis not present

## 2022-10-02 DIAGNOSIS — M0579 Rheumatoid arthritis with rheumatoid factor of multiple sites without organ or systems involvement: Secondary | ICD-10-CM | POA: Diagnosis not present

## 2022-10-02 DIAGNOSIS — I5032 Chronic diastolic (congestive) heart failure: Secondary | ICD-10-CM | POA: Diagnosis not present

## 2022-10-02 DIAGNOSIS — I130 Hypertensive heart and chronic kidney disease with heart failure and stage 1 through stage 4 chronic kidney disease, or unspecified chronic kidney disease: Secondary | ICD-10-CM | POA: Diagnosis not present

## 2022-10-04 DIAGNOSIS — M170 Bilateral primary osteoarthritis of knee: Secondary | ICD-10-CM | POA: Diagnosis not present

## 2022-10-04 DIAGNOSIS — N1832 Chronic kidney disease, stage 3b: Secondary | ICD-10-CM | POA: Diagnosis not present

## 2022-10-04 DIAGNOSIS — M0579 Rheumatoid arthritis with rheumatoid factor of multiple sites without organ or systems involvement: Secondary | ICD-10-CM | POA: Diagnosis not present

## 2022-10-04 DIAGNOSIS — M353 Polymyalgia rheumatica: Secondary | ICD-10-CM | POA: Diagnosis not present

## 2022-10-04 DIAGNOSIS — I5032 Chronic diastolic (congestive) heart failure: Secondary | ICD-10-CM | POA: Diagnosis not present

## 2022-10-04 DIAGNOSIS — I130 Hypertensive heart and chronic kidney disease with heart failure and stage 1 through stage 4 chronic kidney disease, or unspecified chronic kidney disease: Secondary | ICD-10-CM | POA: Diagnosis not present

## 2022-10-05 DIAGNOSIS — M0579 Rheumatoid arthritis with rheumatoid factor of multiple sites without organ or systems involvement: Secondary | ICD-10-CM | POA: Diagnosis not present

## 2022-10-05 DIAGNOSIS — M353 Polymyalgia rheumatica: Secondary | ICD-10-CM | POA: Diagnosis not present

## 2022-10-05 DIAGNOSIS — M170 Bilateral primary osteoarthritis of knee: Secondary | ICD-10-CM | POA: Diagnosis not present

## 2022-10-05 DIAGNOSIS — I130 Hypertensive heart and chronic kidney disease with heart failure and stage 1 through stage 4 chronic kidney disease, or unspecified chronic kidney disease: Secondary | ICD-10-CM | POA: Diagnosis not present

## 2022-10-05 DIAGNOSIS — I5032 Chronic diastolic (congestive) heart failure: Secondary | ICD-10-CM | POA: Diagnosis not present

## 2022-10-05 DIAGNOSIS — N1832 Chronic kidney disease, stage 3b: Secondary | ICD-10-CM | POA: Diagnosis not present

## 2022-10-10 DIAGNOSIS — M170 Bilateral primary osteoarthritis of knee: Secondary | ICD-10-CM | POA: Diagnosis not present

## 2022-10-10 DIAGNOSIS — I5032 Chronic diastolic (congestive) heart failure: Secondary | ICD-10-CM | POA: Diagnosis not present

## 2022-10-10 DIAGNOSIS — N1832 Chronic kidney disease, stage 3b: Secondary | ICD-10-CM | POA: Diagnosis not present

## 2022-10-10 DIAGNOSIS — M353 Polymyalgia rheumatica: Secondary | ICD-10-CM | POA: Diagnosis not present

## 2022-10-10 DIAGNOSIS — I130 Hypertensive heart and chronic kidney disease with heart failure and stage 1 through stage 4 chronic kidney disease, or unspecified chronic kidney disease: Secondary | ICD-10-CM | POA: Diagnosis not present

## 2022-10-10 DIAGNOSIS — M0579 Rheumatoid arthritis with rheumatoid factor of multiple sites without organ or systems involvement: Secondary | ICD-10-CM | POA: Diagnosis not present

## 2022-10-13 DIAGNOSIS — I130 Hypertensive heart and chronic kidney disease with heart failure and stage 1 through stage 4 chronic kidney disease, or unspecified chronic kidney disease: Secondary | ICD-10-CM | POA: Diagnosis not present

## 2022-10-13 DIAGNOSIS — M170 Bilateral primary osteoarthritis of knee: Secondary | ICD-10-CM | POA: Diagnosis not present

## 2022-10-13 DIAGNOSIS — I5032 Chronic diastolic (congestive) heart failure: Secondary | ICD-10-CM | POA: Diagnosis not present

## 2022-10-13 DIAGNOSIS — N1832 Chronic kidney disease, stage 3b: Secondary | ICD-10-CM | POA: Diagnosis not present

## 2022-10-13 DIAGNOSIS — M353 Polymyalgia rheumatica: Secondary | ICD-10-CM | POA: Diagnosis not present

## 2022-10-13 DIAGNOSIS — M0579 Rheumatoid arthritis with rheumatoid factor of multiple sites without organ or systems involvement: Secondary | ICD-10-CM | POA: Diagnosis not present

## 2022-10-19 DIAGNOSIS — I130 Hypertensive heart and chronic kidney disease with heart failure and stage 1 through stage 4 chronic kidney disease, or unspecified chronic kidney disease: Secondary | ICD-10-CM | POA: Diagnosis not present

## 2022-10-19 DIAGNOSIS — M353 Polymyalgia rheumatica: Secondary | ICD-10-CM | POA: Diagnosis not present

## 2022-10-19 DIAGNOSIS — N1832 Chronic kidney disease, stage 3b: Secondary | ICD-10-CM | POA: Diagnosis not present

## 2022-10-19 DIAGNOSIS — M0579 Rheumatoid arthritis with rheumatoid factor of multiple sites without organ or systems involvement: Secondary | ICD-10-CM | POA: Diagnosis not present

## 2022-10-19 DIAGNOSIS — M170 Bilateral primary osteoarthritis of knee: Secondary | ICD-10-CM | POA: Diagnosis not present

## 2022-10-19 DIAGNOSIS — I5032 Chronic diastolic (congestive) heart failure: Secondary | ICD-10-CM | POA: Diagnosis not present

## 2022-10-22 DIAGNOSIS — Z993 Dependence on wheelchair: Secondary | ICD-10-CM | POA: Diagnosis not present

## 2022-10-22 DIAGNOSIS — M5136 Other intervertebral disc degeneration, lumbar region: Secondary | ICD-10-CM | POA: Diagnosis not present

## 2022-10-22 DIAGNOSIS — M316 Other giant cell arteritis: Secondary | ICD-10-CM | POA: Diagnosis not present

## 2022-10-22 DIAGNOSIS — E782 Mixed hyperlipidemia: Secondary | ICD-10-CM | POA: Diagnosis not present

## 2022-10-22 DIAGNOSIS — M353 Polymyalgia rheumatica: Secondary | ICD-10-CM | POA: Diagnosis not present

## 2022-10-22 DIAGNOSIS — Z7952 Long term (current) use of systemic steroids: Secondary | ICD-10-CM | POA: Diagnosis not present

## 2022-10-22 DIAGNOSIS — I130 Hypertensive heart and chronic kidney disease with heart failure and stage 1 through stage 4 chronic kidney disease, or unspecified chronic kidney disease: Secondary | ICD-10-CM | POA: Diagnosis not present

## 2022-10-22 DIAGNOSIS — G894 Chronic pain syndrome: Secondary | ICD-10-CM | POA: Diagnosis not present

## 2022-10-22 DIAGNOSIS — Z87440 Personal history of urinary (tract) infections: Secondary | ICD-10-CM | POA: Diagnosis not present

## 2022-10-22 DIAGNOSIS — D631 Anemia in chronic kidney disease: Secondary | ICD-10-CM | POA: Diagnosis not present

## 2022-10-22 DIAGNOSIS — K6812 Psoas muscle abscess: Secondary | ICD-10-CM | POA: Diagnosis not present

## 2022-10-22 DIAGNOSIS — I5032 Chronic diastolic (congestive) heart failure: Secondary | ICD-10-CM | POA: Diagnosis not present

## 2022-10-22 DIAGNOSIS — M359 Systemic involvement of connective tissue, unspecified: Secondary | ICD-10-CM | POA: Diagnosis not present

## 2022-10-22 DIAGNOSIS — E559 Vitamin D deficiency, unspecified: Secondary | ICD-10-CM | POA: Diagnosis not present

## 2022-10-22 DIAGNOSIS — M4804 Spinal stenosis, thoracic region: Secondary | ICD-10-CM | POA: Diagnosis not present

## 2022-10-22 DIAGNOSIS — N1832 Chronic kidney disease, stage 3b: Secondary | ICD-10-CM | POA: Diagnosis not present

## 2022-10-22 DIAGNOSIS — Z8719 Personal history of other diseases of the digestive system: Secondary | ICD-10-CM | POA: Diagnosis not present

## 2022-10-22 DIAGNOSIS — M170 Bilateral primary osteoarthritis of knee: Secondary | ICD-10-CM | POA: Diagnosis not present

## 2022-10-22 DIAGNOSIS — R1310 Dysphagia, unspecified: Secondary | ICD-10-CM | POA: Diagnosis not present

## 2022-10-22 DIAGNOSIS — K219 Gastro-esophageal reflux disease without esophagitis: Secondary | ICD-10-CM | POA: Diagnosis not present

## 2022-10-22 DIAGNOSIS — M0579 Rheumatoid arthritis with rheumatoid factor of multiple sites without organ or systems involvement: Secondary | ICD-10-CM | POA: Diagnosis not present

## 2022-10-22 DIAGNOSIS — G5603 Carpal tunnel syndrome, bilateral upper limbs: Secondary | ICD-10-CM | POA: Diagnosis not present

## 2022-10-22 DIAGNOSIS — Z9181 History of falling: Secondary | ICD-10-CM | POA: Diagnosis not present

## 2022-10-26 DIAGNOSIS — M0579 Rheumatoid arthritis with rheumatoid factor of multiple sites without organ or systems involvement: Secondary | ICD-10-CM | POA: Diagnosis not present

## 2022-10-26 DIAGNOSIS — I5032 Chronic diastolic (congestive) heart failure: Secondary | ICD-10-CM | POA: Diagnosis not present

## 2022-10-26 DIAGNOSIS — M170 Bilateral primary osteoarthritis of knee: Secondary | ICD-10-CM | POA: Diagnosis not present

## 2022-10-26 DIAGNOSIS — M353 Polymyalgia rheumatica: Secondary | ICD-10-CM | POA: Diagnosis not present

## 2022-10-26 DIAGNOSIS — I130 Hypertensive heart and chronic kidney disease with heart failure and stage 1 through stage 4 chronic kidney disease, or unspecified chronic kidney disease: Secondary | ICD-10-CM | POA: Diagnosis not present

## 2022-10-26 DIAGNOSIS — N1832 Chronic kidney disease, stage 3b: Secondary | ICD-10-CM | POA: Diagnosis not present

## 2022-10-31 DIAGNOSIS — M353 Polymyalgia rheumatica: Secondary | ICD-10-CM | POA: Diagnosis not present

## 2022-10-31 DIAGNOSIS — N1832 Chronic kidney disease, stage 3b: Secondary | ICD-10-CM | POA: Diagnosis not present

## 2022-10-31 DIAGNOSIS — M0579 Rheumatoid arthritis with rheumatoid factor of multiple sites without organ or systems involvement: Secondary | ICD-10-CM | POA: Diagnosis not present

## 2022-10-31 DIAGNOSIS — I130 Hypertensive heart and chronic kidney disease with heart failure and stage 1 through stage 4 chronic kidney disease, or unspecified chronic kidney disease: Secondary | ICD-10-CM | POA: Diagnosis not present

## 2022-10-31 DIAGNOSIS — M170 Bilateral primary osteoarthritis of knee: Secondary | ICD-10-CM | POA: Diagnosis not present

## 2022-10-31 DIAGNOSIS — I5032 Chronic diastolic (congestive) heart failure: Secondary | ICD-10-CM | POA: Diagnosis not present

## 2022-11-01 DIAGNOSIS — N1832 Chronic kidney disease, stage 3b: Secondary | ICD-10-CM | POA: Diagnosis not present

## 2022-11-01 DIAGNOSIS — M0579 Rheumatoid arthritis with rheumatoid factor of multiple sites without organ or systems involvement: Secondary | ICD-10-CM | POA: Diagnosis not present

## 2022-11-01 DIAGNOSIS — I130 Hypertensive heart and chronic kidney disease with heart failure and stage 1 through stage 4 chronic kidney disease, or unspecified chronic kidney disease: Secondary | ICD-10-CM | POA: Diagnosis not present

## 2022-11-01 DIAGNOSIS — M170 Bilateral primary osteoarthritis of knee: Secondary | ICD-10-CM | POA: Diagnosis not present

## 2022-11-01 DIAGNOSIS — M353 Polymyalgia rheumatica: Secondary | ICD-10-CM | POA: Diagnosis not present

## 2022-11-01 DIAGNOSIS — I5032 Chronic diastolic (congestive) heart failure: Secondary | ICD-10-CM | POA: Diagnosis not present

## 2022-11-07 DIAGNOSIS — I5032 Chronic diastolic (congestive) heart failure: Secondary | ICD-10-CM | POA: Diagnosis not present

## 2022-11-07 DIAGNOSIS — M170 Bilateral primary osteoarthritis of knee: Secondary | ICD-10-CM | POA: Diagnosis not present

## 2022-11-07 DIAGNOSIS — M0579 Rheumatoid arthritis with rheumatoid factor of multiple sites without organ or systems involvement: Secondary | ICD-10-CM | POA: Diagnosis not present

## 2022-11-07 DIAGNOSIS — I130 Hypertensive heart and chronic kidney disease with heart failure and stage 1 through stage 4 chronic kidney disease, or unspecified chronic kidney disease: Secondary | ICD-10-CM | POA: Diagnosis not present

## 2022-11-07 DIAGNOSIS — M353 Polymyalgia rheumatica: Secondary | ICD-10-CM | POA: Diagnosis not present

## 2022-11-07 DIAGNOSIS — N1832 Chronic kidney disease, stage 3b: Secondary | ICD-10-CM | POA: Diagnosis not present

## 2022-11-08 DIAGNOSIS — I5032 Chronic diastolic (congestive) heart failure: Secondary | ICD-10-CM | POA: Diagnosis not present

## 2022-11-08 DIAGNOSIS — N1832 Chronic kidney disease, stage 3b: Secondary | ICD-10-CM | POA: Diagnosis not present

## 2022-11-08 DIAGNOSIS — M0579 Rheumatoid arthritis with rheumatoid factor of multiple sites without organ or systems involvement: Secondary | ICD-10-CM | POA: Diagnosis not present

## 2022-11-08 DIAGNOSIS — I130 Hypertensive heart and chronic kidney disease with heart failure and stage 1 through stage 4 chronic kidney disease, or unspecified chronic kidney disease: Secondary | ICD-10-CM | POA: Diagnosis not present

## 2022-11-08 DIAGNOSIS — M170 Bilateral primary osteoarthritis of knee: Secondary | ICD-10-CM | POA: Diagnosis not present

## 2022-11-08 DIAGNOSIS — M353 Polymyalgia rheumatica: Secondary | ICD-10-CM | POA: Diagnosis not present

## 2022-11-13 DIAGNOSIS — N1832 Chronic kidney disease, stage 3b: Secondary | ICD-10-CM | POA: Diagnosis not present

## 2022-11-13 DIAGNOSIS — M353 Polymyalgia rheumatica: Secondary | ICD-10-CM | POA: Diagnosis not present

## 2022-11-13 DIAGNOSIS — M170 Bilateral primary osteoarthritis of knee: Secondary | ICD-10-CM | POA: Diagnosis not present

## 2022-11-13 DIAGNOSIS — M0579 Rheumatoid arthritis with rheumatoid factor of multiple sites without organ or systems involvement: Secondary | ICD-10-CM | POA: Diagnosis not present

## 2022-11-13 DIAGNOSIS — I5032 Chronic diastolic (congestive) heart failure: Secondary | ICD-10-CM | POA: Diagnosis not present

## 2022-11-13 DIAGNOSIS — I130 Hypertensive heart and chronic kidney disease with heart failure and stage 1 through stage 4 chronic kidney disease, or unspecified chronic kidney disease: Secondary | ICD-10-CM | POA: Diagnosis not present

## 2022-11-14 DIAGNOSIS — M0579 Rheumatoid arthritis with rheumatoid factor of multiple sites without organ or systems involvement: Secondary | ICD-10-CM | POA: Diagnosis not present

## 2022-11-14 DIAGNOSIS — M170 Bilateral primary osteoarthritis of knee: Secondary | ICD-10-CM | POA: Diagnosis not present

## 2022-11-14 DIAGNOSIS — M353 Polymyalgia rheumatica: Secondary | ICD-10-CM | POA: Diagnosis not present

## 2022-11-14 DIAGNOSIS — I130 Hypertensive heart and chronic kidney disease with heart failure and stage 1 through stage 4 chronic kidney disease, or unspecified chronic kidney disease: Secondary | ICD-10-CM | POA: Diagnosis not present

## 2022-11-14 DIAGNOSIS — N1832 Chronic kidney disease, stage 3b: Secondary | ICD-10-CM | POA: Diagnosis not present

## 2022-11-14 DIAGNOSIS — I5032 Chronic diastolic (congestive) heart failure: Secondary | ICD-10-CM | POA: Diagnosis not present

## 2022-11-21 DIAGNOSIS — E559 Vitamin D deficiency, unspecified: Secondary | ICD-10-CM | POA: Diagnosis not present

## 2022-11-21 DIAGNOSIS — K219 Gastro-esophageal reflux disease without esophagitis: Secondary | ICD-10-CM | POA: Diagnosis not present

## 2022-11-21 DIAGNOSIS — E782 Mixed hyperlipidemia: Secondary | ICD-10-CM | POA: Diagnosis not present

## 2022-11-21 DIAGNOSIS — G5603 Carpal tunnel syndrome, bilateral upper limbs: Secondary | ICD-10-CM | POA: Diagnosis not present

## 2022-11-21 DIAGNOSIS — M316 Other giant cell arteritis: Secondary | ICD-10-CM | POA: Diagnosis not present

## 2022-11-21 DIAGNOSIS — D631 Anemia in chronic kidney disease: Secondary | ICD-10-CM | POA: Diagnosis not present

## 2022-11-21 DIAGNOSIS — R1310 Dysphagia, unspecified: Secondary | ICD-10-CM | POA: Diagnosis not present

## 2022-11-21 DIAGNOSIS — Z993 Dependence on wheelchair: Secondary | ICD-10-CM | POA: Diagnosis not present

## 2022-11-21 DIAGNOSIS — M353 Polymyalgia rheumatica: Secondary | ICD-10-CM | POA: Diagnosis not present

## 2022-11-21 DIAGNOSIS — M170 Bilateral primary osteoarthritis of knee: Secondary | ICD-10-CM | POA: Diagnosis not present

## 2022-11-21 DIAGNOSIS — G894 Chronic pain syndrome: Secondary | ICD-10-CM | POA: Diagnosis not present

## 2022-11-21 DIAGNOSIS — N1832 Chronic kidney disease, stage 3b: Secondary | ICD-10-CM | POA: Diagnosis not present

## 2022-11-21 DIAGNOSIS — I130 Hypertensive heart and chronic kidney disease with heart failure and stage 1 through stage 4 chronic kidney disease, or unspecified chronic kidney disease: Secondary | ICD-10-CM | POA: Diagnosis not present

## 2022-11-21 DIAGNOSIS — Z7952 Long term (current) use of systemic steroids: Secondary | ICD-10-CM | POA: Diagnosis not present

## 2022-11-21 DIAGNOSIS — Z87440 Personal history of urinary (tract) infections: Secondary | ICD-10-CM | POA: Diagnosis not present

## 2022-11-21 DIAGNOSIS — Z9181 History of falling: Secondary | ICD-10-CM | POA: Diagnosis not present

## 2022-11-21 DIAGNOSIS — K6812 Psoas muscle abscess: Secondary | ICD-10-CM | POA: Diagnosis not present

## 2022-11-21 DIAGNOSIS — M0579 Rheumatoid arthritis with rheumatoid factor of multiple sites without organ or systems involvement: Secondary | ICD-10-CM | POA: Diagnosis not present

## 2022-11-21 DIAGNOSIS — Z8719 Personal history of other diseases of the digestive system: Secondary | ICD-10-CM | POA: Diagnosis not present

## 2022-11-21 DIAGNOSIS — I5032 Chronic diastolic (congestive) heart failure: Secondary | ICD-10-CM | POA: Diagnosis not present

## 2022-11-21 DIAGNOSIS — M359 Systemic involvement of connective tissue, unspecified: Secondary | ICD-10-CM | POA: Diagnosis not present

## 2022-11-21 DIAGNOSIS — M5136 Other intervertebral disc degeneration, lumbar region: Secondary | ICD-10-CM | POA: Diagnosis not present

## 2022-11-21 DIAGNOSIS — M4804 Spinal stenosis, thoracic region: Secondary | ICD-10-CM | POA: Diagnosis not present

## 2022-11-27 DIAGNOSIS — N184 Chronic kidney disease, stage 4 (severe): Secondary | ICD-10-CM | POA: Diagnosis not present

## 2022-11-27 DIAGNOSIS — Z6841 Body Mass Index (BMI) 40.0 and over, adult: Secondary | ICD-10-CM | POA: Diagnosis not present

## 2022-11-27 DIAGNOSIS — I129 Hypertensive chronic kidney disease with stage 1 through stage 4 chronic kidney disease, or unspecified chronic kidney disease: Secondary | ICD-10-CM | POA: Diagnosis not present

## 2022-11-27 DIAGNOSIS — I5032 Chronic diastolic (congestive) heart failure: Secondary | ICD-10-CM | POA: Diagnosis not present

## 2022-11-27 DIAGNOSIS — R809 Proteinuria, unspecified: Secondary | ICD-10-CM | POA: Diagnosis not present

## 2022-11-27 DIAGNOSIS — E211 Secondary hyperparathyroidism, not elsewhere classified: Secondary | ICD-10-CM | POA: Diagnosis not present

## 2022-11-27 DIAGNOSIS — R338 Other retention of urine: Secondary | ICD-10-CM | POA: Diagnosis not present

## 2022-11-27 DIAGNOSIS — N170 Acute kidney failure with tubular necrosis: Secondary | ICD-10-CM | POA: Diagnosis not present

## 2022-11-28 DIAGNOSIS — M170 Bilateral primary osteoarthritis of knee: Secondary | ICD-10-CM | POA: Diagnosis not present

## 2022-11-28 DIAGNOSIS — M0579 Rheumatoid arthritis with rheumatoid factor of multiple sites without organ or systems involvement: Secondary | ICD-10-CM | POA: Diagnosis not present

## 2022-11-28 DIAGNOSIS — M353 Polymyalgia rheumatica: Secondary | ICD-10-CM | POA: Diagnosis not present

## 2022-11-28 DIAGNOSIS — N1832 Chronic kidney disease, stage 3b: Secondary | ICD-10-CM | POA: Diagnosis not present

## 2022-11-28 DIAGNOSIS — I130 Hypertensive heart and chronic kidney disease with heart failure and stage 1 through stage 4 chronic kidney disease, or unspecified chronic kidney disease: Secondary | ICD-10-CM | POA: Diagnosis not present

## 2022-11-28 DIAGNOSIS — I5032 Chronic diastolic (congestive) heart failure: Secondary | ICD-10-CM | POA: Diagnosis not present

## 2022-11-29 DIAGNOSIS — M0579 Rheumatoid arthritis with rheumatoid factor of multiple sites without organ or systems involvement: Secondary | ICD-10-CM | POA: Diagnosis not present

## 2022-11-29 DIAGNOSIS — M170 Bilateral primary osteoarthritis of knee: Secondary | ICD-10-CM | POA: Diagnosis not present

## 2022-11-29 DIAGNOSIS — I5032 Chronic diastolic (congestive) heart failure: Secondary | ICD-10-CM | POA: Diagnosis not present

## 2022-11-29 DIAGNOSIS — N1832 Chronic kidney disease, stage 3b: Secondary | ICD-10-CM | POA: Diagnosis not present

## 2022-11-29 DIAGNOSIS — M353 Polymyalgia rheumatica: Secondary | ICD-10-CM | POA: Diagnosis not present

## 2022-11-29 DIAGNOSIS — I130 Hypertensive heart and chronic kidney disease with heart failure and stage 1 through stage 4 chronic kidney disease, or unspecified chronic kidney disease: Secondary | ICD-10-CM | POA: Diagnosis not present

## 2022-11-30 DIAGNOSIS — Z6841 Body Mass Index (BMI) 40.0 and over, adult: Secondary | ICD-10-CM | POA: Diagnosis not present

## 2022-11-30 DIAGNOSIS — N184 Chronic kidney disease, stage 4 (severe): Secondary | ICD-10-CM | POA: Diagnosis not present

## 2022-11-30 DIAGNOSIS — E211 Secondary hyperparathyroidism, not elsewhere classified: Secondary | ICD-10-CM | POA: Diagnosis not present

## 2022-11-30 DIAGNOSIS — I129 Hypertensive chronic kidney disease with stage 1 through stage 4 chronic kidney disease, or unspecified chronic kidney disease: Secondary | ICD-10-CM | POA: Diagnosis not present

## 2022-11-30 DIAGNOSIS — R809 Proteinuria, unspecified: Secondary | ICD-10-CM | POA: Diagnosis not present

## 2022-11-30 DIAGNOSIS — I5032 Chronic diastolic (congestive) heart failure: Secondary | ICD-10-CM | POA: Diagnosis not present

## 2022-12-05 DIAGNOSIS — M353 Polymyalgia rheumatica: Secondary | ICD-10-CM | POA: Diagnosis not present

## 2022-12-05 DIAGNOSIS — I130 Hypertensive heart and chronic kidney disease with heart failure and stage 1 through stage 4 chronic kidney disease, or unspecified chronic kidney disease: Secondary | ICD-10-CM | POA: Diagnosis not present

## 2022-12-05 DIAGNOSIS — M0579 Rheumatoid arthritis with rheumatoid factor of multiple sites without organ or systems involvement: Secondary | ICD-10-CM | POA: Diagnosis not present

## 2022-12-05 DIAGNOSIS — I5032 Chronic diastolic (congestive) heart failure: Secondary | ICD-10-CM | POA: Diagnosis not present

## 2022-12-05 DIAGNOSIS — N1832 Chronic kidney disease, stage 3b: Secondary | ICD-10-CM | POA: Diagnosis not present

## 2022-12-05 DIAGNOSIS — M170 Bilateral primary osteoarthritis of knee: Secondary | ICD-10-CM | POA: Diagnosis not present

## 2022-12-12 DIAGNOSIS — I130 Hypertensive heart and chronic kidney disease with heart failure and stage 1 through stage 4 chronic kidney disease, or unspecified chronic kidney disease: Secondary | ICD-10-CM | POA: Diagnosis not present

## 2022-12-12 DIAGNOSIS — I5032 Chronic diastolic (congestive) heart failure: Secondary | ICD-10-CM | POA: Diagnosis not present

## 2022-12-12 DIAGNOSIS — N1832 Chronic kidney disease, stage 3b: Secondary | ICD-10-CM | POA: Diagnosis not present

## 2022-12-12 DIAGNOSIS — M0579 Rheumatoid arthritis with rheumatoid factor of multiple sites without organ or systems involvement: Secondary | ICD-10-CM | POA: Diagnosis not present

## 2022-12-12 DIAGNOSIS — M353 Polymyalgia rheumatica: Secondary | ICD-10-CM | POA: Diagnosis not present

## 2022-12-12 DIAGNOSIS — M170 Bilateral primary osteoarthritis of knee: Secondary | ICD-10-CM | POA: Diagnosis not present

## 2022-12-13 DIAGNOSIS — M353 Polymyalgia rheumatica: Secondary | ICD-10-CM | POA: Diagnosis not present

## 2022-12-13 DIAGNOSIS — M170 Bilateral primary osteoarthritis of knee: Secondary | ICD-10-CM | POA: Diagnosis not present

## 2022-12-13 DIAGNOSIS — I5032 Chronic diastolic (congestive) heart failure: Secondary | ICD-10-CM | POA: Diagnosis not present

## 2022-12-13 DIAGNOSIS — N1832 Chronic kidney disease, stage 3b: Secondary | ICD-10-CM | POA: Diagnosis not present

## 2022-12-13 DIAGNOSIS — I130 Hypertensive heart and chronic kidney disease with heart failure and stage 1 through stage 4 chronic kidney disease, or unspecified chronic kidney disease: Secondary | ICD-10-CM | POA: Diagnosis not present

## 2022-12-13 DIAGNOSIS — M0579 Rheumatoid arthritis with rheumatoid factor of multiple sites without organ or systems involvement: Secondary | ICD-10-CM | POA: Diagnosis not present

## 2022-12-19 DIAGNOSIS — I5032 Chronic diastolic (congestive) heart failure: Secondary | ICD-10-CM | POA: Diagnosis not present

## 2022-12-19 DIAGNOSIS — I130 Hypertensive heart and chronic kidney disease with heart failure and stage 1 through stage 4 chronic kidney disease, or unspecified chronic kidney disease: Secondary | ICD-10-CM | POA: Diagnosis not present

## 2022-12-19 DIAGNOSIS — M353 Polymyalgia rheumatica: Secondary | ICD-10-CM | POA: Diagnosis not present

## 2022-12-19 DIAGNOSIS — M0579 Rheumatoid arthritis with rheumatoid factor of multiple sites without organ or systems involvement: Secondary | ICD-10-CM | POA: Diagnosis not present

## 2022-12-19 DIAGNOSIS — N1832 Chronic kidney disease, stage 3b: Secondary | ICD-10-CM | POA: Diagnosis not present

## 2022-12-19 DIAGNOSIS — M170 Bilateral primary osteoarthritis of knee: Secondary | ICD-10-CM | POA: Diagnosis not present

## 2022-12-21 DIAGNOSIS — I5032 Chronic diastolic (congestive) heart failure: Secondary | ICD-10-CM | POA: Diagnosis not present

## 2022-12-21 DIAGNOSIS — Z87440 Personal history of urinary (tract) infections: Secondary | ICD-10-CM | POA: Diagnosis not present

## 2022-12-21 DIAGNOSIS — M5136 Other intervertebral disc degeneration, lumbar region: Secondary | ICD-10-CM | POA: Diagnosis not present

## 2022-12-21 DIAGNOSIS — R1310 Dysphagia, unspecified: Secondary | ICD-10-CM | POA: Diagnosis not present

## 2022-12-21 DIAGNOSIS — Z8719 Personal history of other diseases of the digestive system: Secondary | ICD-10-CM | POA: Diagnosis not present

## 2022-12-21 DIAGNOSIS — I130 Hypertensive heart and chronic kidney disease with heart failure and stage 1 through stage 4 chronic kidney disease, or unspecified chronic kidney disease: Secondary | ICD-10-CM | POA: Diagnosis not present

## 2022-12-21 DIAGNOSIS — M359 Systemic involvement of connective tissue, unspecified: Secondary | ICD-10-CM | POA: Diagnosis not present

## 2022-12-21 DIAGNOSIS — Z9181 History of falling: Secondary | ICD-10-CM | POA: Diagnosis not present

## 2022-12-21 DIAGNOSIS — M170 Bilateral primary osteoarthritis of knee: Secondary | ICD-10-CM | POA: Diagnosis not present

## 2022-12-21 DIAGNOSIS — G894 Chronic pain syndrome: Secondary | ICD-10-CM | POA: Diagnosis not present

## 2022-12-21 DIAGNOSIS — G5603 Carpal tunnel syndrome, bilateral upper limbs: Secondary | ICD-10-CM | POA: Diagnosis not present

## 2022-12-21 DIAGNOSIS — D631 Anemia in chronic kidney disease: Secondary | ICD-10-CM | POA: Diagnosis not present

## 2022-12-21 DIAGNOSIS — E782 Mixed hyperlipidemia: Secondary | ICD-10-CM | POA: Diagnosis not present

## 2022-12-21 DIAGNOSIS — M4804 Spinal stenosis, thoracic region: Secondary | ICD-10-CM | POA: Diagnosis not present

## 2022-12-21 DIAGNOSIS — E559 Vitamin D deficiency, unspecified: Secondary | ICD-10-CM | POA: Diagnosis not present

## 2022-12-21 DIAGNOSIS — K219 Gastro-esophageal reflux disease without esophagitis: Secondary | ICD-10-CM | POA: Diagnosis not present

## 2022-12-21 DIAGNOSIS — Z7952 Long term (current) use of systemic steroids: Secondary | ICD-10-CM | POA: Diagnosis not present

## 2022-12-21 DIAGNOSIS — M0579 Rheumatoid arthritis with rheumatoid factor of multiple sites without organ or systems involvement: Secondary | ICD-10-CM | POA: Diagnosis not present

## 2022-12-21 DIAGNOSIS — M353 Polymyalgia rheumatica: Secondary | ICD-10-CM | POA: Diagnosis not present

## 2022-12-21 DIAGNOSIS — N1832 Chronic kidney disease, stage 3b: Secondary | ICD-10-CM | POA: Diagnosis not present

## 2022-12-21 DIAGNOSIS — M316 Other giant cell arteritis: Secondary | ICD-10-CM | POA: Diagnosis not present

## 2022-12-28 DIAGNOSIS — M170 Bilateral primary osteoarthritis of knee: Secondary | ICD-10-CM | POA: Diagnosis not present

## 2022-12-28 DIAGNOSIS — I130 Hypertensive heart and chronic kidney disease with heart failure and stage 1 through stage 4 chronic kidney disease, or unspecified chronic kidney disease: Secondary | ICD-10-CM | POA: Diagnosis not present

## 2022-12-28 DIAGNOSIS — M353 Polymyalgia rheumatica: Secondary | ICD-10-CM | POA: Diagnosis not present

## 2022-12-28 DIAGNOSIS — M0579 Rheumatoid arthritis with rheumatoid factor of multiple sites without organ or systems involvement: Secondary | ICD-10-CM | POA: Diagnosis not present

## 2022-12-28 DIAGNOSIS — N1832 Chronic kidney disease, stage 3b: Secondary | ICD-10-CM | POA: Diagnosis not present

## 2022-12-28 DIAGNOSIS — I5032 Chronic diastolic (congestive) heart failure: Secondary | ICD-10-CM | POA: Diagnosis not present

## 2023-01-04 DIAGNOSIS — I130 Hypertensive heart and chronic kidney disease with heart failure and stage 1 through stage 4 chronic kidney disease, or unspecified chronic kidney disease: Secondary | ICD-10-CM | POA: Diagnosis not present

## 2023-01-04 DIAGNOSIS — M170 Bilateral primary osteoarthritis of knee: Secondary | ICD-10-CM | POA: Diagnosis not present

## 2023-01-04 DIAGNOSIS — I5032 Chronic diastolic (congestive) heart failure: Secondary | ICD-10-CM | POA: Diagnosis not present

## 2023-01-04 DIAGNOSIS — M353 Polymyalgia rheumatica: Secondary | ICD-10-CM | POA: Diagnosis not present

## 2023-01-04 DIAGNOSIS — N1832 Chronic kidney disease, stage 3b: Secondary | ICD-10-CM | POA: Diagnosis not present

## 2023-01-04 DIAGNOSIS — M0579 Rheumatoid arthritis with rheumatoid factor of multiple sites without organ or systems involvement: Secondary | ICD-10-CM | POA: Diagnosis not present

## 2023-01-11 DIAGNOSIS — N1832 Chronic kidney disease, stage 3b: Secondary | ICD-10-CM | POA: Diagnosis not present

## 2023-01-11 DIAGNOSIS — M0579 Rheumatoid arthritis with rheumatoid factor of multiple sites without organ or systems involvement: Secondary | ICD-10-CM | POA: Diagnosis not present

## 2023-01-11 DIAGNOSIS — M353 Polymyalgia rheumatica: Secondary | ICD-10-CM | POA: Diagnosis not present

## 2023-01-11 DIAGNOSIS — M170 Bilateral primary osteoarthritis of knee: Secondary | ICD-10-CM | POA: Diagnosis not present

## 2023-01-11 DIAGNOSIS — I130 Hypertensive heart and chronic kidney disease with heart failure and stage 1 through stage 4 chronic kidney disease, or unspecified chronic kidney disease: Secondary | ICD-10-CM | POA: Diagnosis not present

## 2023-01-11 DIAGNOSIS — I5032 Chronic diastolic (congestive) heart failure: Secondary | ICD-10-CM | POA: Diagnosis not present

## 2023-01-16 DIAGNOSIS — N1832 Chronic kidney disease, stage 3b: Secondary | ICD-10-CM | POA: Diagnosis not present

## 2023-01-16 DIAGNOSIS — I5032 Chronic diastolic (congestive) heart failure: Secondary | ICD-10-CM | POA: Diagnosis not present

## 2023-01-16 DIAGNOSIS — M170 Bilateral primary osteoarthritis of knee: Secondary | ICD-10-CM | POA: Diagnosis not present

## 2023-01-16 DIAGNOSIS — M353 Polymyalgia rheumatica: Secondary | ICD-10-CM | POA: Diagnosis not present

## 2023-01-16 DIAGNOSIS — I130 Hypertensive heart and chronic kidney disease with heart failure and stage 1 through stage 4 chronic kidney disease, or unspecified chronic kidney disease: Secondary | ICD-10-CM | POA: Diagnosis not present

## 2023-01-16 DIAGNOSIS — M0579 Rheumatoid arthritis with rheumatoid factor of multiple sites without organ or systems involvement: Secondary | ICD-10-CM | POA: Diagnosis not present

## 2023-01-20 DIAGNOSIS — K219 Gastro-esophageal reflux disease without esophagitis: Secondary | ICD-10-CM | POA: Diagnosis not present

## 2023-01-20 DIAGNOSIS — E559 Vitamin D deficiency, unspecified: Secondary | ICD-10-CM | POA: Diagnosis not present

## 2023-01-20 DIAGNOSIS — Z87440 Personal history of urinary (tract) infections: Secondary | ICD-10-CM | POA: Diagnosis not present

## 2023-01-20 DIAGNOSIS — I5032 Chronic diastolic (congestive) heart failure: Secondary | ICD-10-CM | POA: Diagnosis not present

## 2023-01-20 DIAGNOSIS — G894 Chronic pain syndrome: Secondary | ICD-10-CM | POA: Diagnosis not present

## 2023-01-20 DIAGNOSIS — M316 Other giant cell arteritis: Secondary | ICD-10-CM | POA: Diagnosis not present

## 2023-01-20 DIAGNOSIS — M359 Systemic involvement of connective tissue, unspecified: Secondary | ICD-10-CM | POA: Diagnosis not present

## 2023-01-20 DIAGNOSIS — Z7952 Long term (current) use of systemic steroids: Secondary | ICD-10-CM | POA: Diagnosis not present

## 2023-01-20 DIAGNOSIS — D631 Anemia in chronic kidney disease: Secondary | ICD-10-CM | POA: Diagnosis not present

## 2023-01-20 DIAGNOSIS — Z9181 History of falling: Secondary | ICD-10-CM | POA: Diagnosis not present

## 2023-01-20 DIAGNOSIS — N1832 Chronic kidney disease, stage 3b: Secondary | ICD-10-CM | POA: Diagnosis not present

## 2023-01-20 DIAGNOSIS — M5136 Other intervertebral disc degeneration, lumbar region: Secondary | ICD-10-CM | POA: Diagnosis not present

## 2023-01-20 DIAGNOSIS — M353 Polymyalgia rheumatica: Secondary | ICD-10-CM | POA: Diagnosis not present

## 2023-01-20 DIAGNOSIS — E782 Mixed hyperlipidemia: Secondary | ICD-10-CM | POA: Diagnosis not present

## 2023-01-20 DIAGNOSIS — M0579 Rheumatoid arthritis with rheumatoid factor of multiple sites without organ or systems involvement: Secondary | ICD-10-CM | POA: Diagnosis not present

## 2023-01-20 DIAGNOSIS — G5603 Carpal tunnel syndrome, bilateral upper limbs: Secondary | ICD-10-CM | POA: Diagnosis not present

## 2023-01-20 DIAGNOSIS — I130 Hypertensive heart and chronic kidney disease with heart failure and stage 1 through stage 4 chronic kidney disease, or unspecified chronic kidney disease: Secondary | ICD-10-CM | POA: Diagnosis not present

## 2023-01-20 DIAGNOSIS — R1310 Dysphagia, unspecified: Secondary | ICD-10-CM | POA: Diagnosis not present

## 2023-01-20 DIAGNOSIS — M170 Bilateral primary osteoarthritis of knee: Secondary | ICD-10-CM | POA: Diagnosis not present

## 2023-01-20 DIAGNOSIS — Z8719 Personal history of other diseases of the digestive system: Secondary | ICD-10-CM | POA: Diagnosis not present

## 2023-01-20 DIAGNOSIS — M4804 Spinal stenosis, thoracic region: Secondary | ICD-10-CM | POA: Diagnosis not present

## 2023-02-02 DIAGNOSIS — N1832 Chronic kidney disease, stage 3b: Secondary | ICD-10-CM | POA: Diagnosis not present

## 2023-02-02 DIAGNOSIS — M170 Bilateral primary osteoarthritis of knee: Secondary | ICD-10-CM | POA: Diagnosis not present

## 2023-02-02 DIAGNOSIS — I5032 Chronic diastolic (congestive) heart failure: Secondary | ICD-10-CM | POA: Diagnosis not present

## 2023-02-02 DIAGNOSIS — M353 Polymyalgia rheumatica: Secondary | ICD-10-CM | POA: Diagnosis not present

## 2023-02-02 DIAGNOSIS — M0579 Rheumatoid arthritis with rheumatoid factor of multiple sites without organ or systems involvement: Secondary | ICD-10-CM | POA: Diagnosis not present

## 2023-02-02 DIAGNOSIS — I130 Hypertensive heart and chronic kidney disease with heart failure and stage 1 through stage 4 chronic kidney disease, or unspecified chronic kidney disease: Secondary | ICD-10-CM | POA: Diagnosis not present

## 2023-02-14 DIAGNOSIS — I5032 Chronic diastolic (congestive) heart failure: Secondary | ICD-10-CM | POA: Diagnosis not present

## 2023-02-14 DIAGNOSIS — I130 Hypertensive heart and chronic kidney disease with heart failure and stage 1 through stage 4 chronic kidney disease, or unspecified chronic kidney disease: Secondary | ICD-10-CM | POA: Diagnosis not present

## 2023-02-14 DIAGNOSIS — N184 Chronic kidney disease, stage 4 (severe): Secondary | ICD-10-CM | POA: Diagnosis not present

## 2023-02-14 DIAGNOSIS — M0579 Rheumatoid arthritis with rheumatoid factor of multiple sites without organ or systems involvement: Secondary | ICD-10-CM | POA: Diagnosis not present

## 2023-02-14 DIAGNOSIS — R809 Proteinuria, unspecified: Secondary | ICD-10-CM | POA: Diagnosis not present

## 2023-02-14 DIAGNOSIS — I129 Hypertensive chronic kidney disease with stage 1 through stage 4 chronic kidney disease, or unspecified chronic kidney disease: Secondary | ICD-10-CM | POA: Diagnosis not present

## 2023-02-14 DIAGNOSIS — M170 Bilateral primary osteoarthritis of knee: Secondary | ICD-10-CM | POA: Diagnosis not present

## 2023-02-14 DIAGNOSIS — N1832 Chronic kidney disease, stage 3b: Secondary | ICD-10-CM | POA: Diagnosis not present

## 2023-02-14 DIAGNOSIS — M353 Polymyalgia rheumatica: Secondary | ICD-10-CM | POA: Diagnosis not present

## 2023-02-14 DIAGNOSIS — E211 Secondary hyperparathyroidism, not elsewhere classified: Secondary | ICD-10-CM | POA: Diagnosis not present

## 2023-02-27 ENCOUNTER — Ambulatory Visit (INDEPENDENT_AMBULATORY_CARE_PROVIDER_SITE_OTHER): Payer: Medicare Other | Admitting: Podiatry

## 2023-02-27 DIAGNOSIS — M10372 Gout due to renal impairment, left ankle and foot: Secondary | ICD-10-CM | POA: Diagnosis not present

## 2023-03-01 DIAGNOSIS — N2581 Secondary hyperparathyroidism of renal origin: Secondary | ICD-10-CM | POA: Diagnosis not present

## 2023-03-01 DIAGNOSIS — M10372 Gout due to renal impairment, left ankle and foot: Secondary | ICD-10-CM | POA: Diagnosis not present

## 2023-03-01 DIAGNOSIS — R809 Proteinuria, unspecified: Secondary | ICD-10-CM | POA: Diagnosis not present

## 2023-03-01 DIAGNOSIS — I5032 Chronic diastolic (congestive) heart failure: Secondary | ICD-10-CM | POA: Diagnosis not present

## 2023-03-01 DIAGNOSIS — E875 Hyperkalemia: Secondary | ICD-10-CM | POA: Diagnosis not present

## 2023-03-02 LAB — URIC ACID: Uric Acid: 10.9 mg/dL — ABNORMAL HIGH (ref 3.1–7.9)

## 2023-03-02 LAB — SEDIMENTATION RATE: Sed Rate: 69 mm/hr — ABNORMAL HIGH (ref 0–40)

## 2023-03-04 NOTE — Progress Notes (Signed)
  Subjective:  Patient ID: Nicole [name], female    DOB: 1946/10/11,  MRN: [id number]  Chief Complaint  Patient presents with   Foot Pain    new pt-L foot pain with swelling-non req-Dr. Elfredia Nevins refer **pt in wheelchair** - history of drop foot left - PCP thought she might have gout in that foot/toe - they did labs, but not a Uric acid level    77 y.o. female presents with the above complaint. History confirmed with patient.  She has been having kidney issues recently  Objective:  Physical Exam: warm, good capillary refill, no trophic changes or ulcerative lesions, normal DP and PT pulses, normal sensory exam, and left foot swollen and tender around forefoot and midfoot  Assessment:   1. Acute gout due to renal impairment involving toe of left foot      Plan:  Patient was evaluated and treated and all questions answered.  Suspect she is experiencing acute gout flare in setting of CKD.  Follows with rheumatology.  Uric acid and sed rate ordered.  Will discuss with rheumatology for further management.  No follow-ups on file.

## 2023-03-05 MED ORDER — COLCHICINE 0.6 MG PO TABS: 0.6000 mg | ORAL_TABLET | Freq: Every day | ORAL | 2 refills | Status: DC

## 2023-03-05 MED ORDER — PREDNISONE 20 MG PO TABS: 20.0000 mg | ORAL_TABLET | Freq: Every day | ORAL | 0 refills | Status: DC

## 2023-03-05 NOTE — Addendum Note (Signed)
Addended byLilian Kapur, Elieser Tetrick R on: 03/05/2023 05:08 PM   Modules accepted: Orders, Level of Service

## 2023-03-27 ENCOUNTER — Ambulatory Visit (HOSPITAL_COMMUNITY)
Admission: RE | Admit: 2023-03-27 | Discharge: 2023-03-27 | Disposition: A | Discharge status: Home / Self Care | Payer: Medicare Other | Source: Ambulatory Visit | Attending: Internal Medicine | Admitting: Internal Medicine

## 2023-03-27 ENCOUNTER — Other Ambulatory Visit (HOSPITAL_COMMUNITY): Payer: Self-pay | Admitting: Internal Medicine

## 2023-03-27 DIAGNOSIS — R079 Chest pain, unspecified: Secondary | ICD-10-CM

## 2023-03-30 DIAGNOSIS — R0600 Dyspnea, unspecified: Secondary | ICD-10-CM | POA: Diagnosis not present

## 2023-03-30 DIAGNOSIS — I509 Heart failure, unspecified: Secondary | ICD-10-CM | POA: Diagnosis not present

## 2023-03-30 DIAGNOSIS — K219 Gastro-esophageal reflux disease without esophagitis: Secondary | ICD-10-CM | POA: Diagnosis not present

## 2023-03-30 DIAGNOSIS — R079 Chest pain, unspecified: Secondary | ICD-10-CM | POA: Diagnosis not present

## 2023-04-10 DIAGNOSIS — M069 Rheumatoid arthritis, unspecified: Secondary | ICD-10-CM | POA: Diagnosis not present

## 2023-04-10 DIAGNOSIS — I509 Heart failure, unspecified: Secondary | ICD-10-CM | POA: Diagnosis not present

## 2023-04-17 ENCOUNTER — Telehealth: Payer: Self-pay | Admitting: Cardiovascular Disease

## 2023-04-17 NOTE — Telephone Encounter (Signed)
Pt c/o of Chest Pain: STAT if CP now or developed within 24 hours  1. Are you having CP right now?   Patient stated her chest is achy  2. Are you experiencing any other symptoms (ex. SOB, nausea, vomiting, sweating)?  SOB when transferring from wheel chair  3. How long have you been experiencing CP?  Started about a month ago  4. Is your CP continuous or coming and going?   Comes and goes  5. Have you taken Nitroglycerin?   No   Patient stated she had blood work done at [facility] in [place] on 6/6 and results showed abnormal findings.  Patient wants to call back to discuss next steps.

## 2023-04-18 ENCOUNTER — Emergency Department (HOSPITAL_COMMUNITY): Payer: Medicare Other

## 2023-04-18 ENCOUNTER — Encounter (HOSPITAL_COMMUNITY): Payer: Self-pay

## 2023-04-18 ENCOUNTER — Inpatient Hospital Stay (HOSPITAL_COMMUNITY)
Admission: EM | Admit: 2023-04-18 | Discharge: 2023-05-01 | DRG: 175 | Disposition: A | Discharge status: Skilled Nursing Facility | Payer: Medicare Other | Attending: Internal Medicine | Admitting: Internal Medicine

## 2023-04-18 ENCOUNTER — Other Ambulatory Visit: Payer: Self-pay

## 2023-04-18 DIAGNOSIS — K219 Gastro-esophageal reflux disease without esophagitis: Secondary | ICD-10-CM | POA: Diagnosis present

## 2023-04-18 DIAGNOSIS — M48061 Spinal stenosis, lumbar region without neurogenic claudication: Secondary | ICD-10-CM | POA: Diagnosis present

## 2023-04-18 DIAGNOSIS — N2581 Secondary hyperparathyroidism of renal origin: Secondary | ICD-10-CM | POA: Diagnosis present

## 2023-04-18 DIAGNOSIS — R809 Proteinuria, unspecified: Secondary | ICD-10-CM | POA: Diagnosis present

## 2023-04-18 DIAGNOSIS — E782 Mixed hyperlipidemia: Secondary | ICD-10-CM | POA: Diagnosis present

## 2023-04-18 DIAGNOSIS — Z86711 Personal history of pulmonary embolism: Secondary | ICD-10-CM

## 2023-04-18 DIAGNOSIS — I10 Essential (primary) hypertension: Secondary | ICD-10-CM | POA: Diagnosis present

## 2023-04-18 DIAGNOSIS — M069 Rheumatoid arthritis, unspecified: Secondary | ICD-10-CM | POA: Diagnosis present

## 2023-04-18 DIAGNOSIS — R600 Localized edema: Secondary | ICD-10-CM | POA: Diagnosis not present

## 2023-04-18 DIAGNOSIS — Z833 Family history of diabetes mellitus: Secondary | ICD-10-CM

## 2023-04-18 DIAGNOSIS — Z880 Allergy status to penicillin: Secondary | ICD-10-CM

## 2023-04-18 DIAGNOSIS — M79605 Pain in left leg: Secondary | ICD-10-CM | POA: Diagnosis not present

## 2023-04-18 DIAGNOSIS — Z7952 Long term (current) use of systemic steroids: Secondary | ICD-10-CM

## 2023-04-18 DIAGNOSIS — S301XXD Contusion of abdominal wall, subsequent encounter: Secondary | ICD-10-CM

## 2023-04-18 DIAGNOSIS — G894 Chronic pain syndrome: Secondary | ICD-10-CM | POA: Diagnosis not present

## 2023-04-18 DIAGNOSIS — Z6841 Body Mass Index (BMI) 40.0 and over, adult: Secondary | ICD-10-CM

## 2023-04-18 DIAGNOSIS — Z8249 Family history of ischemic heart disease and other diseases of the circulatory system: Secondary | ICD-10-CM

## 2023-04-18 DIAGNOSIS — M7981 Nontraumatic hematoma of soft tissue: Secondary | ICD-10-CM | POA: Diagnosis not present

## 2023-04-18 DIAGNOSIS — I2699 Other pulmonary embolism without acute cor pulmonale: Secondary | ICD-10-CM | POA: Diagnosis not present

## 2023-04-18 DIAGNOSIS — R0789 Other chest pain: Secondary | ICD-10-CM | POA: Diagnosis not present

## 2023-04-18 DIAGNOSIS — R079 Chest pain, unspecified: Secondary | ICD-10-CM | POA: Diagnosis not present

## 2023-04-18 DIAGNOSIS — Z993 Dependence on wheelchair: Secondary | ICD-10-CM

## 2023-04-18 DIAGNOSIS — Z885 Allergy status to narcotic agent status: Secondary | ICD-10-CM

## 2023-04-18 DIAGNOSIS — E66813 Obesity, class 3: Secondary | ICD-10-CM | POA: Diagnosis present

## 2023-04-18 DIAGNOSIS — I890 Lymphedema, not elsewhere classified: Secondary | ICD-10-CM | POA: Diagnosis present

## 2023-04-18 DIAGNOSIS — R41841 Cognitive communication deficit: Secondary | ICD-10-CM | POA: Diagnosis present

## 2023-04-18 DIAGNOSIS — E871 Hypo-osmolality and hyponatremia: Secondary | ICD-10-CM | POA: Diagnosis not present

## 2023-04-18 DIAGNOSIS — Z79899 Other long term (current) drug therapy: Secondary | ICD-10-CM

## 2023-04-18 DIAGNOSIS — F1120 Opioid dependence, uncomplicated: Secondary | ICD-10-CM | POA: Diagnosis present

## 2023-04-18 DIAGNOSIS — I130 Hypertensive heart and chronic kidney disease with heart failure and stage 1 through stage 4 chronic kidney disease, or unspecified chronic kidney disease: Secondary | ICD-10-CM | POA: Diagnosis present

## 2023-04-18 DIAGNOSIS — R799 Abnormal finding of blood chemistry, unspecified: Secondary | ICD-10-CM | POA: Diagnosis not present

## 2023-04-18 DIAGNOSIS — I5033 Acute on chronic diastolic (congestive) heart failure: Secondary | ICD-10-CM | POA: Diagnosis not present

## 2023-04-18 DIAGNOSIS — M316 Other giant cell arteritis: Secondary | ICD-10-CM | POA: Diagnosis present

## 2023-04-18 DIAGNOSIS — M79604 Pain in right leg: Secondary | ICD-10-CM | POA: Diagnosis not present

## 2023-04-18 DIAGNOSIS — Z9049 Acquired absence of other specified parts of digestive tract: Secondary | ICD-10-CM

## 2023-04-18 DIAGNOSIS — Z791 Long term (current) use of non-steroidal anti-inflammatories (NSAID): Secondary | ICD-10-CM

## 2023-04-18 DIAGNOSIS — N184 Chronic kidney disease, stage 4 (severe): Secondary | ICD-10-CM | POA: Diagnosis not present

## 2023-04-18 DIAGNOSIS — K224 Dyskinesia of esophagus: Secondary | ICD-10-CM | POA: Diagnosis present

## 2023-04-18 DIAGNOSIS — N189 Chronic kidney disease, unspecified: Secondary | ICD-10-CM | POA: Diagnosis not present

## 2023-04-18 DIAGNOSIS — Z8261 Family history of arthritis: Secondary | ICD-10-CM

## 2023-04-18 DIAGNOSIS — Z882 Allergy status to sulfonamides status: Secondary | ICD-10-CM

## 2023-04-18 DIAGNOSIS — M353 Polymyalgia rheumatica: Secondary | ICD-10-CM | POA: Diagnosis present

## 2023-04-18 LAB — BASIC METABOLIC PANEL
Anion gap: 12 (ref 5–15)
BUN: 62 mg/dL — ABNORMAL HIGH (ref 8–23)
CO2: 26 mmol/L (ref 22–32)
Calcium: 9 mg/dL (ref 8.9–10.3)
Chloride: 97 mmol/L — ABNORMAL LOW (ref 98–111)
Creatinine, Ser: 2.24 mg/dL — ABNORMAL HIGH (ref 0.44–1.00)
GFR, Estimated: 22 mL/min — ABNORMAL LOW (ref >60)
Glucose, Bld: 110 mg/dL — ABNORMAL HIGH (ref 70–99)
Potassium: 5.2 mmol/L — ABNORMAL HIGH (ref 3.5–5.1)
Sodium: 135 mmol/L (ref 135–145)

## 2023-04-18 LAB — CBC
HCT: 33 % — ABNORMAL LOW (ref 36.0–46.0)
Hemoglobin: 10.3 g/dL — ABNORMAL LOW (ref 12.0–15.0)
MCH: 29.4 pg (ref 26.0–34.0)
MCHC: 31.2 g/dL (ref 30.0–36.0)
MCV: 94.3 fL (ref 80.0–100.0)
Platelets: 260 10*3/uL (ref 150–400)
RBC: 3.5 MIL/uL — ABNORMAL LOW (ref 3.87–5.11)
RDW: 17 % — ABNORMAL HIGH (ref 11.5–15.5)
WBC: 9.5 10*3/uL (ref 4.0–10.5)
nRBC: 0 % (ref 0.0–0.2)

## 2023-04-18 LAB — TROPONIN I (HIGH SENSITIVITY)
Troponin I (High Sensitivity): 10 ng/L (ref <18)
Troponin I (High Sensitivity): 9 ng/L (ref <18)

## 2023-04-18 LAB — BRAIN NATRIURETIC PEPTIDE: B Natriuretic Peptide: 28 pg/mL (ref 0.0–100.0)

## 2023-04-18 LAB — D-DIMER, QUANTITATIVE: D-Dimer, Quant: 1.24 ug/mL-FEU — ABNORMAL HIGH (ref 0.00–0.50)

## 2023-04-18 MED ORDER — ACETAMINOPHEN 650 MG RE SUPP: 650.0000 mg | Freq: Four times a day (QID) | RECTAL | Status: DC | PRN

## 2023-04-18 MED ORDER — HEPARIN (PORCINE) 25000 UT/250ML-% IV SOLN
1150.0000 [IU]/h | INTRAVENOUS | Status: DC
  Administered 2023-04-18: 1300 [IU]/h via INTRAVENOUS
  Administered 2023-04-19: 1200 [IU]/h via INTRAVENOUS
  Administered 2023-04-20 – 2023-04-21 (×2): 1150 [IU]/h via INTRAVENOUS
  Filled 2023-04-18 (×4): qty 250

## 2023-04-18 MED ORDER — POLYETHYLENE GLYCOL 3350 17 G PO PACK
17.0000 g | PACK | Freq: Every day | ORAL | Status: DC | PRN
  Administered 2023-04-21 – 2023-04-28 (×3): 17 g via ORAL
  Filled 2023-04-18 (×3): qty 1

## 2023-04-18 MED ORDER — TECHNETIUM TO 99M ALBUMIN AGGREGATED
4.4000 | Freq: Once | INTRAVENOUS | Status: AC | PRN
  Administered 2023-04-18: 4.4 via INTRAVENOUS

## 2023-04-18 MED ORDER — FUROSEMIDE 10 MG/ML IJ SOLN: 40.0000 mg | Freq: Two times a day (BID) | INTRAMUSCULAR | Status: DC

## 2023-04-18 MED ORDER — ONDANSETRON HCL 4 MG/2ML IJ SOLN
4.0000 mg | Freq: Four times a day (QID) | INTRAMUSCULAR | Status: DC | PRN
  Administered 2023-04-22 (×2): 4 mg via INTRAVENOUS
  Filled 2023-04-18 (×2): qty 2

## 2023-04-18 MED ORDER — ONDANSETRON HCL 4 MG PO TABS: 4.0000 mg | ORAL_TABLET | Freq: Four times a day (QID) | ORAL | Status: DC | PRN

## 2023-04-18 MED ORDER — ACETAMINOPHEN 325 MG PO TABS
650.0000 mg | ORAL_TABLET | Freq: Four times a day (QID) | ORAL | Status: DC | PRN
  Administered 2023-04-20 – 2023-05-01 (×7): 650 mg via ORAL
  Filled 2023-04-18 (×8): qty 2

## 2023-04-18 MED ORDER — SODIUM CHLORIDE 0.9 % IV BOLUS
500.0000 mL | Freq: Once | INTRAVENOUS | Status: AC
  Administered 2023-04-18: 500 mL via INTRAVENOUS

## 2023-04-18 MED ORDER — SODIUM ZIRCONIUM CYCLOSILICATE 5 G PO PACK
10.0000 g | PACK | Freq: Once | ORAL | Status: AC
  Administered 2023-04-18: 10 g via ORAL
  Filled 2023-04-18: qty 2

## 2023-04-18 MED ORDER — HEPARIN BOLUS VIA INFUSION
4500.0000 [IU] | Freq: Once | INTRAVENOUS | Status: AC
  Administered 2023-04-18: 4500 [IU] via INTRAVENOUS

## 2023-04-18 MED ORDER — ATORVASTATIN CALCIUM 20 MG PO TABS
20.0000 mg | ORAL_TABLET | Freq: Every morning | ORAL | Status: DC
  Administered 2023-04-19 – 2023-05-01 (×13): 20 mg via ORAL
  Filled 2023-04-18 (×13): qty 1

## 2023-04-18 NOTE — Assessment & Plan Note (Addendum)
Polymyalgia rheumatica.  Patient has been on chronic systemic corticosteroids, 10 mg prednisone.  Continue pain control.

## 2023-04-18 NOTE — ED Triage Notes (Signed)
Pt BIB sitter from home c/o chest tightness x 3-4 weeks. Went to the dr 6/6 and had lab work done.   States the pain is not sharp, is more of an ache. Occasional wheezing with deep inspiration.   Son on the phone currently, concerned about fluid retention in legs. States family believes chest tightness may be gas. Also stating that lab results from earlier this month lead to referral to cardiologist.

## 2023-04-18 NOTE — Assessment & Plan Note (Deleted)
In the setting of PE. She is not Hypoxic or tachycardic. 2+ pitting bilateral lower extremity edema to the knees, swelling to right lower extremity is new.  Chest x-ray underinflation with bronchovascular crowding.  Bilateral lower extremity venous Dopplers- technically limited study negative for DVT.  BNP 28, likely not reflecting volume status due to habitus. Per chart ~ 10lb weight gain, if reliable. Troponin 10 > 9.  Last echo 2022- EF 65 to 70%, moderate LVH.  She is unable to confirm if she takes Lasix or torsemide, both are listed. -IV Lasix 40 twice daily - Strict input output, daily weights, daily BMP - F/u ECHO

## 2023-04-18 NOTE — Assessment & Plan Note (Addendum)
Continue oral analgesics and gabapentin.

## 2023-04-18 NOTE — H&P (Addendum)
History and Physical    DAMIYAH DITMARS [name]:[id number] DOB: 28-Jan-1946 DOA: 04/18/2023  PCP: [name], PA-C   Patient coming from: Home  I have personally briefly reviewed patient's old medical records in [facility]  Chief Complaint: Difficulty breathing  HPI: Nicole [name] is a 77 y.o. female with medical history significant for diastolic CHF, rheumatoid arthritis, pulmonary embolism, hypertension. Patient presented to the ED with complaints of chest tightness over the past 3 to 4 weeks.  At the time of my evaluation, patient's brother [name] is at bedside and assists with the history.  Patient is not the best historian. Patient reports ongoing chest tightness difficulty breathing with exertion by the past year at least, but feels it is recently worse.  Present ambulates are mostly just transfers.  She reports worsening dyspnea during transfers with wheezing.  No cough.  Has been taking a lot of antiacid and simethicone recently for chest tightness.  She is not sure of the name of her medications, as they come in prearranged packs, but she believes she takes a fluid pill-Lasix or torsemide.  She has chronic swelling to her left lower extremity, but is unaware that her right is significantly swollen also.  ED Course: O2 sats greater than 92% on room air.  Tmax 98.3.  Respiratory 10 -22.  Blood pressure systolic 130s to 440N.  D-dimer elevated at 1.24.  BNP 28.  Troponin 10 > 9.  Chest x-ray shows underinflation, bronchovascular crowding. Bilateral lower extremity venous Dopplers-negative for DVT, but technically difficult study due to body habitus and pitting edema,patient pain intolerance limiting calf vein evaluation. bolus given.  Lokelma 10g x 1 given. Heparin drip started.  Review of Systems: As per HPI all other systems reviewed and negative.  Past Medical History:  Diagnosis Date   AC (acromioclavicular) joint bone spurs    lt shoulder   Acute kidney failure  (HCC)    Anemia    Arthritis    Carpal tunnel syndrome, bilateral    CHF (congestive heart failure) (HCC)    Chronic kidney disease    Chronic pain syndrome    Cognitive communication deficit    Collagen vascular disease (HCC)    Dysphagia    Gastroesophageal reflux    Headache    recent visit to ER @ [facility] for severe headache   Hypertension    Lumbar stenosis    Hx of ESIs by Dr. Ethelene Hal   Major depressive disorder    Metabolic encephalopathy    Muscle weakness    Polyarthralgia    Polymyalgia (HCC)    Shingles    Spinal stenosis     Past Surgical History:  Procedure Laterality Date   BACK SURGERY  07/05/2018, 07/2018   x2    BIOPSY  09/22/2020   Procedure: BIOPSY;  Surgeon: Malissa Hippo, MD;  [facility];  Service: Endoscopy;;   BIOPSY  10/05/2021   Procedure: BIOPSY;  Surgeon: Dolores Frame, MD;  [facility]: [facility];  Service: Gastroenterology;;   CHOLECYSTECTOMY     COLONOSCOPY  06/11/2012   Procedure: COLONOSCOPY;  Surgeon: Dalia Heading, MD;  [facility];  Service: Gastroenterology;  Laterality: N/A;   COLONOSCOPY WITH PROPOFOL N/A 09/22/2020   Procedure: COLONOSCOPY WITH PROPOFOL;  Surgeon: Malissa Hippo, MD;  [facility]: [facility];  Service: Endoscopy;  Laterality: N/A;  730   ESOPHAGOGASTRODUODENOSCOPY (EGD) WITH PROPOFOL N/A 10/05/2021   Procedure: ESOPHAGOGASTRODUODENOSCOPY (EGD) WITH PROPOFOL;  Surgeon: Marguerita Merles, Reuel Boom, MD;  [facility]: [facility];  Service: Gastroenterology;  Laterality: N/A;   HYSTEROSCOPY WITH D & C N/A 02/25/2014   Procedure: DILATATION AND CURETTAGE /HYSTEROSCOPY;  Surgeon: Lazaro Arms, MD;  Location: [facility];  Service: Gynecology;  Laterality: N/A;   POLYPECTOMY N/A 02/25/2014   Procedure: POLYPECTOMY;  Surgeon: Lazaro Arms, MD;  Location: [facility];  Service: Gynecology;  Laterality: N/A;   RESECTION DISTAL CLAVICAL Right 03/26/2015   Procedure: OPEN DISTAL CLAVICAL RESECTION ;   Surgeon: Beverely Low, MD;  Location: [facility] OR;  Service: Orthopedics;  Laterality: Right;     reports that she has never smoked. She has never been exposed to tobacco smoke. She has never used smokeless tobacco. She reports that she does not drink alcohol and does not use drugs.  Allergies  Allergen Reactions   Oxycontin [Oxycodone Hcl] Swelling    Pt tolerates hydromorphone.   Sulfa Antibiotics Other (See Comments)    Sores   Tramadol     "Makes me feel like I am falling"   Penicillins Rash    Tolerated augmentin    Sulfasalazine Other (See Comments)    Sores    Family History  Problem Relation Age of Onset   Hypertension Mother    Pneumonia Father    Arthritis Sister    Diabetes Paternal Grandmother     Prior to Admission medications   Medication Sig Start Date End Date Taking? Authorizing Provider  acetaminophen (TYLENOL) 325 MG tablet Take 2 tablets (650 mg total) by mouth every 6 (six) hours as needed for mild pain or headache (or Fever >/= 101). 05/06/18   Vassie Loll, MD  alfuzosin (UROXATRAL) 10 MG 24 hr tablet Take 1 tablet (10 mg total) by mouth at bedtime. Patient not taking: Reported on 08/02/2022 02/27/22   Malen Gauze, MD  ascorbic acid (VITAMIN C) 500 MG tablet Take 1 tablet (500 mg total) by mouth 2 (two) times daily. 12/23/21   Vassie Loll, MD  atorvastatin (LIPITOR) 20 MG tablet Take 20 mg by mouth every morning. 07/27/22   [provider]  calcitRIOL (ROCALTROL) 0.25 MCG capsule Take 0.25 mcg by mouth See admin instructions. Take 0.25 every Mon, Wed, Fri. Patient not taking: Reported on 08/02/2022 09/06/20   [provider]  celecoxib (CELEBREX) 100 MG capsule Take 100 mg by mouth daily. Patient not taking: Reported on 08/02/2022    [provider]  cholecalciferol (VITAMIN D3) 25 MCG (1000 UNIT) tablet Take by mouth. 04/19/20   [provider]  citalopram (CELEXA) 20 MG tablet Take 20 mg by mouth daily.    [provider]  colchicine 0.6 MG tablet Take 1 tablet (0.6 mg total) by mouth daily. Take 1.2mg  (2 tablets) then 0.6mg  (1 tablet) 1 hour after. Then, take 1 tablet every day for 7 days. 03/05/23 04/04/23  McDonald, Rachelle Hora, DPM  cyclobenzaprine (FLEXERIL) 10 MG tablet Take 10 mg by mouth 3 (three) times daily as needed for muscle spasms. Patient not taking: Reported on 08/02/2022    [provider]  dextrose 5 % solution Inject 75 mL/hr into the vein continuous. Patient not taking: Reported on 08/02/2022 12/23/21   Vassie Loll, MD  diclofenac Sodium (VOLTAREN) 1 % GEL Apply topically 4 (four) times daily. Patient not taking: Reported on 08/02/2022    [provider]  Docusate Sodium (DSS) 100 MG CAPS Take by mouth. 06/27/18   [provider]  famotidine (PEPCID) 10 MG  tablet Take 10 mg by mouth 2 (two) times daily. Patient not taking: Reported on 08/02/2022    [provider]  ferrous sulfate 325 (65 FE) MG tablet Take by mouth. 09/16/18   [provider]  fluticasone (FLONASE) 50 MCG/ACT nasal spray     [provider]  folic acid (FOLVITE) 400 MCG tablet Take 800 mcg by mouth daily. Patient not taking: Reported on 08/02/2022    [provider]  furosemide (LASIX) 40 MG tablet     [provider]  gabapentin (NEURONTIN) 300 MG capsule Take 300 mg by mouth 3 (three) times daily.    [provider]  Glucosamine HCl (GLUCOSAMINE PO) Take by mouth daily.    [provider]  HYDROcodone-acetaminophen (NORCO/VICODIN) 5-325 MG tablet Take 1 tablet by mouth every morning. Patient not taking: Reported on 08/02/2022 02/10/22   [provider]  leflunomide (ARAVA) 10 MG tablet Take by mouth. 03/05/20   [provider]  lidocaine (ASPERCREME LIDOCAINE) 4 % Place 1 patch onto the skin daily. Patient not taking: Reported on 08/02/2022    [provider]  meloxicam (MOBIC) 7.5 MG tablet Take 7.5  mg by mouth every morning.    [provider]  methocarbamol (ROBAXIN) 500 MG tablet     [provider]  metoprolol succinate (TOPROL XL) 25 MG 24 hr tablet Take 1 tablet (25 mg total) by mouth daily. 07/21/21 08/02/22  Shon Hale, MD  metoprolol tartrate (LOPRESSOR) 25 MG tablet Take 25 mg by mouth 2 (two) times daily.    [provider]  montelukast (SINGULAIR) 10 MG tablet Take 10 mg by mouth at bedtime.    [provider]  Mouthwashes (BIOTENE/CALCIUM PBF) LIQD by Transmucosal route. 06/27/18   [provider]  Multiple Vitamins-Minerals (MULTIVITAMIN PO) Take 1 tablet by mouth daily. Theratrum complete    [provider]  nystatin (MYCOSTATIN/NYSTOP) powder  03/24/20   [provider]  pantoprazole (PROTONIX) 40 MG tablet  08/20/13   [provider]  potassium chloride (KLOR-CON M) 10 MEQ tablet Take by mouth. 08/14/19   [provider]  potassium chloride (KLOR-CON) 10 MEQ tablet Take 2 tablets (20 mEq total) by mouth daily. 12/23/21 03/15/22  Vassie Loll, MD  pravastatin (PRAVACHOL) 80 MG tablet Take 1 tablet (80 mg total) by mouth daily. Resume at time of discharge when able to tolerate PO's. Patient not taking: Reported on 05/17/2022 12/23/21   Vassie Loll, MD  predniSONE (DELTASONE) 1 MG tablet Take 9 tablets (9 mg total) by mouth daily with breakfast. Patient not taking: Reported on 08/02/2022 07/22/21   Shon Hale, MD  predniSONE (DELTASONE) 10 MG tablet Take 10 mg by mouth daily with breakfast.    [provider]  predniSONE (DELTASONE) 20 MG tablet Take 1 tablet (20 mg total) by mouth daily with breakfast for 5 days. 03/05/23 03/10/23  Edwin Cap, DPM  spironolactone (ALDACTONE) 25 MG tablet Take 25 mg by mouth daily.    [provider]  tamsulosin (FLOMAX) 0.4 MG CAPS capsule Take 0.4 mg by mouth daily after breakfast.    [provider]  Tapentadol HCl (NUCYNTA ER)  200 MG TB12 Take 1 tablet every 12 hours by oral route. 05/05/21   [provider]  Torsemide 40 MG TABS Take 40 mg by mouth 2 (two) times daily. Dose to be further adjusted and medication resume when renal function fully recovered and patient able to tolerate PO's. 12/23/21   Gwenlyn Perking,  [name], MD  traZODone (DESYREL) 50 MG tablet Take 25 mg by mouth at bedtime. 09/22/21   [provider]  trolamine salicylate (ASPERCREME) 10 % cream Apply 1 application  topically as needed for muscle pain.    [provider]  zinc sulfate 220 (50 Zn) MG capsule Take 1 capsule (220 mg total) by mouth daily. Patient not taking: Reported on 08/02/2022 12/23/21   Vassie Loll, MD    Physical Exam: Vitals:   04/18/23 1152 04/18/23 1152 04/18/23 1237 04/18/23 1718  BP:  131/71 133/64 139/71  Pulse:   67 74  Resp:   16 (!) 22  Temp: 98.3 F (36.8 C)  98.3 F (36.8 C) 98.1 F (36.7 C)  TempSrc: Oral  Oral Oral  SpO2:   93% 93%  Height:   5\' 1"  (1.549 m)     Constitutional: Obese,  calm, comfortable Vitals:   04/18/23 1152 04/18/23 1152 04/18/23 1237 04/18/23 1718  BP:  131/71 133/64 139/71  Pulse:   67 74  Resp:   16 (!) 22  Temp: 98.3 F (36.8 C)  98.3 F (36.8 C) 98.1 F (36.7 C)  TempSrc: Oral  Oral Oral  SpO2:   93% 93%  Height:   5\' 1"  (1.549 m)    Eyes: PERRL, lids and conjunctivae normal ENMT: Mucous membranes are moist.  Neck: normal, supple, no masses, no thyromegaly Respiratory: clear to auscultation bilaterally, no wheezing, no crackles. Normal respiratory effort. No accessory muscle use.  Cardiovascular: Regular rate and rhythm, no murmurs / rubs / gallops.  Plus pitting bilateral lower extremity edema appears to be worse on the right, left lower extremity tenderness to palpation limiting exam,.  Extremeties warm. Abdomen: no tenderness, no masses palpated. No hepatosplenomegaly. Bowel sounds positive.  Musculoskeletal: no clubbing / cyanosis. No joint deformity  upper and lower extremities.  Skin: no rashes, lesions, ulcers. No induration Neurologic: No facial asymmetry, moving extremities spontaniously Psychiatric: Normal judgment and insight. Alert and oriented x 3. Normal mood.   Labs on Admission: I have personally reviewed following labs and imaging studies  CBC: Recent Labs  Lab 04/18/23 1235  WBC 9.5  HGB 10.3*  HCT 33.0*  MCV 94.3  PLT 260   Basic Metabolic Panel: Recent Labs  Lab 04/18/23 1235  NA 135  K 5.2*  CL 97*  CO2 26  GLUCOSE 110*  BUN 62*  CREATININE 2.24*  CALCIUM 9.0   Radiological Exams on Admission: NM Pulmonary Perfusion  Result Date: 04/18/2023 CLINICAL DATA:  Pulmonary embolism (PE) suspected, low to intermediate prob, positive D-dimer EXAM: NUCLEAR MEDICINE PERFUSION LUNG SCAN TECHNIQUE: Perfusion images were obtained in multiple projections after intravenous injection of radiopharmaceutical. Ventilation scans intentionally deferred if perfusion scan and chest x-ray adequate for interpretation during COVID 19 epidemic. RADIOPHARMACEUTICALS:  4.4 mCi Tc-60m MAA IV COMPARISON:  04/18/2023 FINDINGS: Planar images of the lungs are obtained in multiple projections during the perfusion phase of the exam. There is a single wedge-shaped subsegmental perfusion defect within the lateral aspect of the superior segment right lower lobe. No other perfusion defects. IMPRESSION: 1. Single wedge-shaped perfusion defect within the lateral aspect superior segment right lower lobe, compatible with pulmonary embolus based on PISAPED criteria. Critical Value/emergent results were called by telephone at the time of interpretation on 04/18/2023 at 5:27pm to provider Dr. Charm Barges, who verbally acknowledged these results. Electronically Signed   By: Sharlet Salina M.D.   On: 04/18/2023 17:42   US Venous Img Lower Bilateral (DVT)  Result Date: 04/18/2023 CLINICAL DATA:  Bilateral edema, worsening right.  Pain EXAM: BILATERAL LOWER EXTREMITY  VENOUS DOPPLER ULTRASOUND TECHNIQUE: Gray-scale sonography with compression, as well as color and duplex ultrasound, were performed to evaluate the deep venous system(s) from the level of the common femoral vein through the popliteal and proximal calf veins. COMPARISON:  08/29/2007 FINDINGS: VENOUS Normal compressibility of the common femoral, superficial femoral, and popliteal veins, as well as the visualized calf veins. Visualized portions of profunda femoral vein and great saphenous vein unremarkable. No filling defects to suggest DVT on grayscale or color Doppler imaging. Doppler waveforms show normal direction of venous flow, normal respiratory plasticity and response to augmentation. OTHER Marked subcutaneous edema in the distal left calf. Limitations: Technologist describes technically difficult study secondary to body habitus, pitting edema, and patient pain intolerance limiting calf vein evaluation. IMPRESSION: No femoropopliteal DVT . If clinical symptoms are inconsistent or if there are persistent or worsening symptoms, further imaging (possibly involving the iliac veins) may be warranted. Electronically Signed   By: Corlis Leak M.D.   On: 04/18/2023 15:09   DG Chest 2 View  Result Date: 04/18/2023 CLINICAL DATA:  Chest pain EXAM: CHEST - 2 VIEW COMPARISON:  03/27/2019 FINDINGS: Underinflation. Bronchovascular crowding. Borderline cardiopericardial silhouette with a calcified aorta. Left midlung scar or atelectasis is once again identified. No pneumothorax or effusion. Overlapping cardiac leads. Degenerative changes of the shoulders and spine. Under penetrated radiograph IMPRESSION: Underinflation with bronchovascular crowding. Stable left midlung scar or atelectasis. Electronically Signed   By: Karen Kays M.D.   On: 04/18/2023 13:06    EKG: Independently reviewed.  Sinus rhythm, rate 70, QTc 444, no significant change from prior.  Assessment/Plan Principal Problem:   Pulmonary embolism  (HCC) Active Problems:   Acute on chronic diastolic CHF (congestive heart failure) (HCC)   Obesity, Class III, BMI 40-49.9 (morbid obesity) (HCC)   HTN (hypertension)   Chronic pain syndrome   Rheumatoid arthritis (HCC)   CKD (chronic kidney disease), stage IV (HCC)  Assessment and Plan: * Pulmonary embolism (HCC) VQ scan - single wedge-shaped perfusion defect within the lateral aspect superior segment right lower lobe, compatible with pulmonary embolus.  VQ scan obtained due to CKD 4.  Bilateral LE venous Dopplers technically difficult study but negative for DVT.  No history of GI blood loss or frequent falls.  Patient is mostly nonambulatory. -PE hx listed in past medical history but I do not see documentation to confirm this. -IV heparin -Updated echocardiogram  Acute on chronic diastolic CHF (congestive heart failure) (HCC) In the setting of PE. She is not Hypoxic or tachycardic. 2+ pitting bilateral lower extremity edema to the knees, swelling to right lower extremity is new.  Chest x-ray underinflation with bronchovascular crowding.  Bilateral lower extremity venous Dopplers- technically limited study negative for DVT.  BNP 28, likely not reflecting volume status due to habitus. Per chart ~ 10lb weight gain, if reliable. Troponin 10 > 9.  Last echo 2022- EF 65 to 70%, moderate LVH.  She is unable to confirm if she takes Lasix or torsemide, both are listed. -IV Lasix 40 twice daily - Strict input output, daily weights, daily BMP - F/u ECHO  CKD (chronic kidney disease), stage IV (HCC) Creatinine today 2.2, per care everwhere baseline ~ 2.  Last check 02/14/2023 creatinine was 2.07  Rheumatoid arthritis (HCC) On leflunomide.  Stable.  Chronic pain syndrome Pending med rec - resume home pain medications  HTN (hypertension) Stable. - Pending ed  rec resume-  metoprolol in a.m.,  - IV Lasix   DVT prophylaxis: Heparin gtt Code Status: FULL Code- Confirmed with patient, and brother  [name] at bedside Family Communication: Daughter Lorenzo at bedside.  But patient's son- [name]- is HCPOA.  Disposition Plan: ~ 2 days Consults called: None  Admission status:  Obs tele    Author: Onnie Boer, MD 04/18/2023 9:01 PM  For on call review [email or web address].

## 2023-04-18 NOTE — Assessment & Plan Note (Addendum)
Continue blood pressure monitoring.   Dyslipidemia, continue with statin therapy.

## 2023-04-18 NOTE — ED Provider Notes (Cosign Needed)
[place] EMERGENCY DEPARTMENT AT [facility] Provider Note   CSN: [id number] Arrival date & time: 04/18/23  1135     History  Chief Complaint  Patient presents with   Chest Pain    Nicole [name] is a 77 y.o. female.  Has PMH of morbid obesity, hypertension, polymyalgia rheumatica, sciatica, chronic low back pain and lymphedema. She comes ER today for evaluation of chest pain has been ongoing for a month, states it is constant sometimes worse, is better after she drinks a carbonated beverage is able to burp.  She has tried some gas medicines that have given her some mild occasional relief.  She states on June 6 she followed up with primary care and they ordered some tests.  I spoke with her son Nicole [name] on the phone, and he had a history, states they ordered a troponin test and kidney function test.  They told her she did abnormal troponin and abnormal renal function test.  They were able to schedule follow-up with the kidney doctor but have been referred to cardiology but had not received a phone call back with his appointment.  Having persistent symptoms, also having some pain with a deep breath without any increased swelling in the right leg which is not her usual.  They called cardiologist office and were directed to come to the ED for evaluation due to having continued chest pain  Denies palpitations or dizziness, no nausea or vomiting.  Occasional upper abdominal pain primarily right upper quadrant, history of cholecystectomy in the past, states this is a chronic problem for her.   Chest Pain      Home Medications Prior to Admission medications   Medication Sig Start Date End Date Taking? Authorizing Provider  acetaminophen (TYLENOL) 325 MG tablet Take 2 tablets (650 mg total) by mouth every 6 (six) hours as needed for mild pain or headache (or Fever >/= 101). 05/06/18   Vassie Loll, MD  alfuzosin (UROXATRAL) 10 MG 24 hr tablet Take 1 tablet (10 mg total) by mouth  at bedtime. Patient not taking: Reported on 08/02/2022 02/27/22   Malen Gauze, MD  ascorbic acid (VITAMIN C) 500 MG tablet Take 1 tablet (500 mg total) by mouth 2 (two) times daily. 12/23/21   Vassie Loll, MD  atorvastatin (LIPITOR) 20 MG tablet Take 20 mg by mouth every morning. 07/27/22   [provider]  calcitRIOL (ROCALTROL) 0.25 MCG capsule Take 0.25 mcg by mouth See admin instructions. Take 0.25 every Mon, Wed, Fri. Patient not taking: Reported on 08/02/2022 09/06/20   [provider]  celecoxib (CELEBREX) 100 MG capsule Take 100 mg by mouth daily. Patient not taking: Reported on 08/02/2022    [provider]  cholecalciferol (VITAMIN D3) 25 MCG (1000 UNIT) tablet Take by mouth. 04/19/20   [provider]  citalopram (CELEXA) 20 MG tablet Take 20 mg by mouth daily.    [provider]  colchicine 0.6 MG tablet Take 1 tablet (0.6 mg total) by mouth daily. Take 1.2mg  (2 tablets) then 0.6mg  (1 tablet) 1 hour after. Then, take 1 tablet every day for 7 days. 03/05/23 04/04/23  McDonald, Rachelle Hora, DPM  cyclobenzaprine (FLEXERIL) 10 MG tablet Take 10 mg by mouth 3 (three) times daily as needed for muscle spasms. Patient not taking: Reported on 08/02/2022    [provider]  dextrose 5 % solution Inject 75 mL/hr into the vein continuous. Patient not taking: Reported on 08/02/2022 12/23/21   Vassie Loll, MD  diclofenac Sodium (VOLTAREN) 1 % GEL Apply topically 4 (four) times daily. Patient not taking: Reported on 08/02/2022    [provider]  Docusate Sodium (DSS) 100 MG CAPS Take by mouth. 06/27/18   [provider]  famotidine (PEPCID) 10 MG tablet Take 10 mg by mouth 2 (two) times daily. Patient not taking: Reported on 08/02/2022    [provider]  ferrous sulfate 325 (65 FE) MG tablet Take by mouth. 09/16/18   [provider]  fluticasone (FLONASE) 50 MCG/ACT nasal spray     [provider]   folic acid (FOLVITE) 400 MCG tablet Take 800 mcg by mouth daily. Patient not taking: Reported on 08/02/2022    [provider]  furosemide (LASIX) 40 MG tablet     [provider]  gabapentin (NEURONTIN) 300 MG capsule Take 300 mg by mouth 3 (three) times daily.    [provider]  Glucosamine HCl (GLUCOSAMINE PO) Take by mouth daily.    [provider]  HYDROcodone-acetaminophen (NORCO/VICODIN) 5-325 MG tablet Take 1 tablet by mouth every morning. Patient not taking: Reported on 08/02/2022 02/10/22   [provider]  leflunomide (ARAVA) 10 MG tablet Take by mouth. 03/05/20   [provider]  lidocaine (ASPERCREME LIDOCAINE) 4 % Place 1 patch onto the skin daily. Patient not taking: Reported on 08/02/2022    [provider]  meloxicam (MOBIC) 7.5 MG tablet Take 7.5 mg by mouth every morning.    [provider]  methocarbamol (ROBAXIN) 500 MG tablet     [provider]  metoprolol succinate (TOPROL XL) 25 MG 24 hr tablet Take 1 tablet (25 mg total) by mouth daily. 07/21/21 08/02/22  Shon Hale, MD  metoprolol tartrate (LOPRESSOR) 25 MG tablet Take 25 mg by mouth 2 (two) times daily.    [provider]  montelukast (SINGULAIR) 10 MG tablet Take 10 mg by mouth at bedtime.    [provider]  Mouthwashes (BIOTENE/CALCIUM PBF) LIQD by Transmucosal route. 06/27/18   [provider]  Multiple Vitamins-Minerals (MULTIVITAMIN PO) Take 1 tablet by mouth daily. Theratrum complete    [provider]  nystatin (MYCOSTATIN/NYSTOP) powder  03/24/20   [provider]  pantoprazole (PROTONIX) 40 MG tablet  08/20/13   [provider]  potassium chloride (KLOR-CON M) 10 MEQ tablet Take by mouth. 08/14/19   [provider]  potassium chloride (KLOR-CON) 10 MEQ tablet Take 2 tablets (20 mEq total) by mouth daily. 12/23/21 03/15/22  Vassie Loll, MD  pravastatin (PRAVACHOL)  80 MG tablet Take 1 tablet (80 mg total) by mouth daily. Resume at time of discharge when able to tolerate PO's. Patient not taking: Reported on 05/17/2022 12/23/21   Vassie Loll, MD  predniSONE (DELTASONE) 1 MG tablet Take 9 tablets (9 mg total) by mouth daily with breakfast. Patient not taking: Reported on 08/02/2022 07/22/21   Shon Hale, MD  predniSONE (DELTASONE) 10 MG tablet Take 10 mg by mouth daily with breakfast.    [provider]  predniSONE (DELTASONE) 20 MG tablet Take 1 tablet (20 mg total) by mouth daily with breakfast for 5 days. 03/05/23 03/10/23  Edwin Cap, DPM  spironolactone (ALDACTONE) 25 MG tablet Take 25 mg by mouth daily.    [provider]  tamsulosin (FLOMAX) 0.4 MG CAPS capsule Take 0.4 mg by mouth daily after breakfast.    [provider]  Tapentadol HCl (NUCYNTA ER) 200 MG TB12 Take 1 tablet every 12 hours by  oral route. 05/05/21   [provider]  Torsemide 40 MG TABS Take 40 mg by mouth 2 (two) times daily. Dose to be further adjusted and medication resume when renal function fully recovered and patient able to tolerate PO's. 12/23/21   Vassie Loll, MD  traZODone (DESYREL) 50 MG tablet Take 25 mg by mouth at bedtime. 09/22/21   [provider]  trolamine salicylate (ASPERCREME) 10 % cream Apply 1 application  topically as needed for muscle pain.    [provider]  zinc sulfate 220 (50 Zn) MG capsule Take 1 capsule (220 mg total) by mouth daily. Patient not taking: Reported on 08/02/2022 12/23/21   Vassie Loll, MD      Allergies    Oxycontin [oxycodone hcl], Sulfa antibiotics, Tramadol, Penicillins, and Sulfasalazine    Review of Systems   Review of Systems  Cardiovascular:  Positive for chest pain.    Physical Exam Updated Vital Signs BP 139/71 (BP Location: Right Wrist)   Pulse 74   Temp 98.1 F (36.7 C) (Oral)   Resp (!) 22   Ht 5\' 1"  (1.549 m)   Wt 115.3 kg   SpO2 93%   BMI 48.01 kg/m   Physical Exam Vitals and nursing note reviewed.  Constitutional:      General: She is not in acute distress.    Appearance: She is well-developed.  HENT:     Head: Normocephalic and atraumatic.  Eyes:     Extraocular Movements: Extraocular movements intact.     Conjunctiva/sclera: Conjunctivae normal.     Pupils: Pupils are equal, round, and reactive to light.  Cardiovascular:     Rate and Rhythm: Normal rate and regular rhythm.     Heart sounds: No murmur heard. Pulmonary:     Effort: Pulmonary effort is normal. No respiratory distress.     Breath sounds: Normal breath sounds.  Abdominal:     Palpations: Abdomen is soft.     Tenderness: There is no abdominal tenderness.  Musculoskeletal:        General: No swelling.     Cervical back: Neck supple.     Right lower leg: Edema present.     Left lower leg: Edema present.  Skin:    General: Skin is warm and dry.     Capillary Refill: Capillary refill takes less than 2 seconds.  Neurological:     Mental Status: She is alert.  Psychiatric:        Mood and Affect: Mood normal.     ED Results / Procedures / Treatments   Labs (all labs ordered are listed, but only abnormal results are displayed) Labs Reviewed  BASIC METABOLIC PANEL - Abnormal; Notable for the following components:      Result Value   Potassium 5.2 (*)    Chloride 97 (*)    Glucose, Bld 110 (*)    BUN 62 (*)    Creatinine, Ser 2.24 (*)    GFR, Estimated 22 (*)    All other components within normal limits  CBC - Abnormal; Notable for the following components:   RBC 3.50 (*)    Hemoglobin 10.3 (*)    HCT 33.0 (*)    RDW 17.0 (*)    All other components within normal limits  D-DIMER, QUANTITATIVE - Abnormal; Notable for the following components:   D-Dimer, Quant 1.24 (*)    All other components within normal limits  BRAIN NATRIURETIC PEPTIDE  HEPARIN LEVEL (UNFRACTIONATED)  TROPONIN I (HIGH SENSITIVITY)  TROPONIN I (HIGH SENSITIVITY)    EKG EKG  Interpretation  Date/Time:  Wednesday April 18 2023 11:52:00 EDT Ventricular Rate:  70 PR Interval:  198 QRS Duration: 103 QT Interval:  411 QTC Calculation: 444 R Axis:   -36 Text Interpretation: Sinus rhythm Inferior infarct, old No acute changes No significant change since last tracing Confirmed by Derwood Kaplan [phone]) on 04/18/2023 12:42:08 PM  Radiology NM Pulmonary Perfusion  Result Date: 04/18/2023 CLINICAL DATA:  Pulmonary embolism (PE) suspected, low to intermediate prob, positive D-dimer EXAM: NUCLEAR MEDICINE PERFUSION LUNG SCAN TECHNIQUE: Perfusion images were obtained in multiple projections after intravenous injection of radiopharmaceutical. Ventilation scans intentionally deferred if perfusion scan and chest x-ray adequate for interpretation during COVID 19 epidemic. RADIOPHARMACEUTICALS:  4.4 mCi Tc-38m MAA IV COMPARISON:  04/18/2023 FINDINGS: Planar images of the lungs are obtained in multiple projections during the perfusion phase of the exam. There is a single wedge-shaped subsegmental perfusion defect within the lateral aspect of the superior segment right lower lobe. No other perfusion defects. IMPRESSION: 1. Single wedge-shaped perfusion defect within the lateral aspect superior segment right lower lobe, compatible with pulmonary embolus based on PISAPED criteria. Critical Value/emergent results were called by telephone at the time of interpretation on 04/18/2023 at 5:27pm to provider Dr. Charm Barges, who verbally acknowledged these results. Electronically Signed   By: Sharlet Salina M.D.   On: 04/18/2023 17:42   US Venous Img Lower Bilateral (DVT)  Result Date: 04/18/2023 CLINICAL DATA:  Bilateral edema, worsening right.  Pain EXAM: BILATERAL LOWER EXTREMITY VENOUS DOPPLER ULTRASOUND TECHNIQUE: Gray-scale sonography with compression, as well as color and duplex ultrasound, were performed to evaluate the deep venous system(s) from the level of the common femoral vein through the  popliteal and proximal calf veins. COMPARISON:  08/29/2007 FINDINGS: VENOUS Normal compressibility of the common femoral, superficial femoral, and popliteal veins, as well as the visualized calf veins. Visualized portions of profunda femoral vein and great saphenous vein unremarkable. No filling defects to suggest DVT on grayscale or color Doppler imaging. Doppler waveforms show normal direction of venous flow, normal respiratory plasticity and response to augmentation. OTHER Marked subcutaneous edema in the distal left calf. Limitations: Technologist describes technically difficult study secondary to body habitus, pitting edema, and patient pain intolerance limiting calf vein evaluation. IMPRESSION: No femoropopliteal DVT . If clinical symptoms are inconsistent or if there are persistent or worsening symptoms, further imaging (possibly involving the iliac veins) may be warranted. Electronically Signed   By: Corlis Leak M.D.   On: 04/18/2023 15:09   DG Chest 2 View  Result Date: 04/18/2023 CLINICAL DATA:  Chest pain EXAM: CHEST - 2 VIEW COMPARISON:  03/27/2019 FINDINGS: Underinflation. Bronchovascular crowding. Borderline cardiopericardial silhouette with a calcified aorta. Left midlung scar or atelectasis is once again identified. No pneumothorax or effusion. Overlapping cardiac leads. Degenerative changes of the shoulders and spine. Under penetrated radiograph IMPRESSION: Underinflation with bronchovascular crowding. Stable left midlung scar or atelectasis. Electronically Signed   By: Karen Kays M.D.   On: 04/18/2023 13:06    Procedures Procedures    Medications Ordered in ED Medications  heparin bolus via infusion 4,500 Units (has no administration in time range)  heparin ADULT infusion 100 units/mL (25000 units/223mL) (has no administration in time range)  sodium zirconium cyclosilicate (LOKELMA) packet 10 g (10 g Oral Given 04/18/23 1718)  technetium albumin aggregated (MAA) injection solution  4.4 millicurie (4.4 millicuries Intravenous Contrast Given 04/18/23 1645)  sodium chloride 0.9 % bolus 500 mL (0  mLs Intravenous Stopped 04/18/23 1850)    ED Course/ Medical Decision Making/ A&P Clinical Course as of 04/18/23 1853  Wed Apr 18, 2023  1625 Trops ruled out, DVT study negative, awaiting VQ for pt's elevated d-dimer, though may be elevated d/t her dx of PMR  [CB]    Clinical Course User Index [CB] Ma Rings, PA-C                             Medical Decision Making This patient presents to the ED for concern of chest pain and SOB, this involves an extensive number of treatment options, and is a complaint that carries with it a high risk of complications and morbidity.  The differential diagnosis includes ACS, PE, pneumonia, CHF exacerbation, other   Co morbidities that complicate the patient evaluation :   CKD, CHF   Additional history obtained:  Additional history obtained from EMR External records from outside source obtained and reviewed including notes   Lab Tests:  I Ordered, and personally interpreted labs.  The pertinent results include: Labs are flat, D-dimer is elevated at 1.24, potassium slightly elevated at 5.2, creatinine 2.24, is around her baseline, CBC shows anemia around baseline  Imaging Studies ordered:  I ordered imaging studies including chest x-ray I independently visualized and interpreted imaging which showed edema or infiltrate I agree with the radiologist interpretation   Cardiac Monitoring: / EKG:  The patient was maintained on a cardiac monitor.  I personally viewed and interpreted the cardiac monitored which showed an underlying rhythm of: Sinus rhythm   Consultations Obtained:  I requested consultation with the hospitalist, Dr. Mariea Clonts,  and discussed lab and imaging findings as well as pertinent plan - they recommend: admission   Problem List / ED Course / Critical interventions / Medication  management  Embolus-patient's been having a month of chest pain and shortness of breath rhythm worsening, having some pleurisy and increased leg swelling.  Apparently had an appointment about a month ago as an outpatient had of troponin T with elevated result and was told to follow-up with cardiology but has not done so yet, call the cardiology office today to try to get them and due to her continued chest pain and history of elevated troponin she was directed to come to the ER.  Troponins are flat here, she is having increased leg swelling and tenderness, D-dimer elevated, ultrasound limited but negative for acute DVT, could not get CT angio of her chest due to her chronic renal failure but V/Q study was positive.  PESI score is 86, with the hospitalist to see if patient would benefit from hospitalization versus discharge and due to patient's chronic medical problems and age she felt she would benefit from hospitalization.  Patient seems to have poor insight her chronic medical conditions so I think this will be optimal for her to stay for treatment, and she is agreeable with this.  In addition she also lives alone.  I have reviewed the patients home medicines and have made adjustments as needed   Social Determinants of Health:  Pt lives by herself       Amount and/or Complexity of Data Reviewed Labs: ordered. Radiology: ordered.  Risk Prescription drug management. Decision regarding hospitalization.           Final Clinical Impression(s) / ED Diagnoses Final diagnoses:  Acute pulmonary embolism without acute cor pulmonale, unspecified pulmonary embolism type (HCC)    Rx /  DC Orders ED Discharge Orders     None         Ma Rings, [place] 04/18/23 1853

## 2023-04-18 NOTE — Progress Notes (Signed)
ANTICOAGULATION CONSULT NOTE - Initial Consult  Pharmacy Consult for heparin Indication: pulmonary embolus  Allergies  Allergen Reactions   Oxycontin [Oxycodone Hcl] Swelling    Pt tolerates hydromorphone.   Sulfa Antibiotics Other (See Comments)    Sores   Tramadol     "Makes me feel like I am falling"   Penicillins Rash    Tolerated augmentin    Sulfasalazine Other (See Comments)    Sores    Patient Measurements: Height: 5\' 1"  (154.9 cm) IBW/kg (Calculated) : 47.8 Heparin Dosing Weight: 76.4  Vital Signs: Temp: 98.1 F (36.7 C) (06/26 1718) Temp Source: Oral (06/26 1718) BP: 139/71 (06/26 1718) Pulse Rate: 74 (06/26 1718)  Labs: Recent Labs    04/18/23 1235 04/18/23 1505  HGB 10.3*  --   HCT 33.0*  --   PLT 260  --   CREATININE 2.24*  --   TROPONINIHS 10 9    CrCl cannot be calculated (Unknown ideal weight.).   Medical History: Past Medical History:  Diagnosis Date   AC (acromioclavicular) joint bone spurs    lt shoulder   Acute kidney failure (HCC)    Anemia    Arthritis    Carpal tunnel syndrome, bilateral    CHF (congestive heart failure) (HCC)    Chronic kidney disease    Chronic pain syndrome    Cognitive communication deficit    Collagen vascular disease (HCC)    Dysphagia    Gastroesophageal reflux    Headache    recent visit to ER @ [facility] for severe headache   Hypertension    Lumbar stenosis    Hx of ESIs by Dr. Ethelene Hal   Major depressive disorder    Metabolic encephalopathy    Muscle weakness    Polyarthralgia    Polymyalgia (HCC)    Shingles    Spinal stenosis     Medications:  (Not in a hospital admission)   Assessment: 77 yo female with history of morbid obesity and HTN presents after several days of chest pain. Patient with previously high troponin per note. Patient instructed to present to the ED for evaluation for persistent chest pain. D-dimer elevated as well as elevated SCR of 2.24. Dopplers for DVT were negative,  but patient with VQ scan positive for a RLL perfusion defect compatible with pulmonary embolism. Hgb 10.3, PLT 260. No PTA anticoagulation noted in med list or ED notes.    Goal of Therapy:  Heparin level 0.3-0.7 units/ml Monitor platelets by anticoagulation protocol: Yes   Plan:  Heparin bolus 4500 units Heparin drip at 1300 units/hr Heparin level in 8 hours and daily CBC daily Monitor for bleeding  Melvinia Ashby A Kennie Snedden 04/18/2023,6:13 PM

## 2023-04-18 NOTE — Assessment & Plan Note (Addendum)
Hyponatremia.   Renal function is improving with serum cr at 1,39, K is 4,3 and serum bicarbonate 26. Na 134,   Plan to continue close monitoring renal function and monitoring.  Avoid hypotension and nephrotoxic medications.

## 2023-04-18 NOTE — ED Notes (Signed)
Contacted [facility] at this time takes up to 2 hours for delivery. Misty Stanley

## 2023-04-18 NOTE — Telephone Encounter (Signed)
I spoke with patient and physical therapist that was with patient at this time. She reports an episode of CP yesterday which resolved and then had an other episode this am. The physical therapist said patient has a lot more swelling in her legs today.Of note, patient had lab work done by [facility] on 04/10/23 and patient has not heard back from them regarding test results. A troponin -T was done with high result of 81.  After speaking with our PA, B.Strader, her recommendation is that the patient be evaluated in the ED. The physical therapist encouraged the patient to go to the ED and indicated they were also going to call [facility].

## 2023-04-18 NOTE — Assessment & Plan Note (Deleted)
VQ scan - single wedge-shaped perfusion defect within the lateral aspect superior segment right lower lobe, compatible with pulmonary embolus.  VQ scan obtained due to CKD 4.  Bilateral LE venous Dopplers technically difficult study but negative for DVT.  No history of GI blood loss or frequent falls.  Patient is mostly nonambulatory. -PE hx listed in past medical history but I do not see documentation to confirm this. -IV heparin -Updated echocardiogram

## 2023-04-19 ENCOUNTER — Other Ambulatory Visit (HOSPITAL_COMMUNITY): Payer: Self-pay | Admitting: *Deleted

## 2023-04-19 ENCOUNTER — Observation Stay (HOSPITAL_COMMUNITY): Payer: Medicare Other

## 2023-04-19 DIAGNOSIS — I7389 Other specified peripheral vascular diseases: Secondary | ICD-10-CM | POA: Diagnosis present

## 2023-04-19 DIAGNOSIS — I130 Hypertensive heart and chronic kidney disease with heart failure and stage 1 through stage 4 chronic kidney disease, or unspecified chronic kidney disease: Secondary | ICD-10-CM | POA: Diagnosis present

## 2023-04-19 DIAGNOSIS — D529 Folate deficiency anemia, unspecified: Secondary | ICD-10-CM | POA: Diagnosis present

## 2023-04-19 DIAGNOSIS — D84821 Immunodeficiency due to drugs: Secondary | ICD-10-CM | POA: Diagnosis present

## 2023-04-19 DIAGNOSIS — M7981 Nontraumatic hematoma of soft tissue: Secondary | ICD-10-CM | POA: Diagnosis not present

## 2023-04-19 DIAGNOSIS — N2581 Secondary hyperparathyroidism of renal origin: Secondary | ICD-10-CM | POA: Diagnosis present

## 2023-04-19 DIAGNOSIS — E212 Other hyperparathyroidism: Secondary | ICD-10-CM | POA: Diagnosis present

## 2023-04-19 DIAGNOSIS — G894 Chronic pain syndrome: Secondary | ICD-10-CM | POA: Diagnosis present

## 2023-04-19 DIAGNOSIS — I5032 Chronic diastolic (congestive) heart failure: Secondary | ICD-10-CM | POA: Diagnosis present

## 2023-04-19 DIAGNOSIS — F1120 Opioid dependence, uncomplicated: Secondary | ICD-10-CM | POA: Diagnosis present

## 2023-04-19 DIAGNOSIS — I2609 Other pulmonary embolism with acute cor pulmonale: Secondary | ICD-10-CM | POA: Diagnosis not present

## 2023-04-19 DIAGNOSIS — D649 Anemia, unspecified: Secondary | ICD-10-CM | POA: Diagnosis present

## 2023-04-19 DIAGNOSIS — K224 Dyskinesia of esophagus: Secondary | ICD-10-CM | POA: Diagnosis present

## 2023-04-19 DIAGNOSIS — R5381 Other malaise: Secondary | ICD-10-CM | POA: Diagnosis present

## 2023-04-19 DIAGNOSIS — K219 Gastro-esophageal reflux disease without esophagitis: Secondary | ICD-10-CM | POA: Diagnosis present

## 2023-04-19 DIAGNOSIS — I2699 Other pulmonary embolism without acute cor pulmonale: Secondary | ICD-10-CM | POA: Diagnosis present

## 2023-04-19 DIAGNOSIS — R41841 Cognitive communication deficit: Secondary | ICD-10-CM | POA: Diagnosis present

## 2023-04-19 DIAGNOSIS — R4181 Age-related cognitive decline: Secondary | ICD-10-CM | POA: Diagnosis present

## 2023-04-19 DIAGNOSIS — Z8249 Family history of ischemic heart disease and other diseases of the circulatory system: Secondary | ICD-10-CM | POA: Diagnosis not present

## 2023-04-19 DIAGNOSIS — Z6841 Body Mass Index (BMI) 40.0 and over, adult: Secondary | ICD-10-CM | POA: Diagnosis not present

## 2023-04-19 DIAGNOSIS — K5730 Diverticulosis of large intestine without perforation or abscess without bleeding: Secondary | ICD-10-CM | POA: Diagnosis not present

## 2023-04-19 DIAGNOSIS — Z993 Dependence on wheelchair: Secondary | ICD-10-CM | POA: Diagnosis not present

## 2023-04-19 DIAGNOSIS — I10 Essential (primary) hypertension: Secondary | ICD-10-CM | POA: Diagnosis not present

## 2023-04-19 DIAGNOSIS — I5033 Acute on chronic diastolic (congestive) heart failure: Secondary | ICD-10-CM

## 2023-04-19 DIAGNOSIS — M316 Other giant cell arteritis: Secondary | ICD-10-CM | POA: Diagnosis present

## 2023-04-19 DIAGNOSIS — M48061 Spinal stenosis, lumbar region without neurogenic claudication: Secondary | ICD-10-CM | POA: Diagnosis present

## 2023-04-19 DIAGNOSIS — S301XXD Contusion of abdominal wall, subsequent encounter: Secondary | ICD-10-CM | POA: Diagnosis not present

## 2023-04-19 DIAGNOSIS — Z7401 Bed confinement status: Secondary | ICD-10-CM | POA: Diagnosis not present

## 2023-04-19 DIAGNOSIS — M109 Gout, unspecified: Secondary | ICD-10-CM | POA: Diagnosis present

## 2023-04-19 DIAGNOSIS — E7849 Other hyperlipidemia: Secondary | ICD-10-CM | POA: Diagnosis present

## 2023-04-19 DIAGNOSIS — I517 Cardiomegaly: Secondary | ICD-10-CM | POA: Diagnosis not present

## 2023-04-19 DIAGNOSIS — M069 Rheumatoid arthritis, unspecified: Secondary | ICD-10-CM | POA: Diagnosis not present

## 2023-04-19 DIAGNOSIS — Z7952 Long term (current) use of systemic steroids: Secondary | ICD-10-CM | POA: Diagnosis not present

## 2023-04-19 DIAGNOSIS — F331 Major depressive disorder, recurrent, moderate: Secondary | ICD-10-CM | POA: Diagnosis present

## 2023-04-19 DIAGNOSIS — K429 Umbilical hernia without obstruction or gangrene: Secondary | ICD-10-CM | POA: Diagnosis not present

## 2023-04-19 DIAGNOSIS — I2510 Atherosclerotic heart disease of native coronary artery without angina pectoris: Secondary | ICD-10-CM | POA: Diagnosis not present

## 2023-04-19 DIAGNOSIS — R531 Weakness: Secondary | ICD-10-CM | POA: Diagnosis not present

## 2023-04-19 DIAGNOSIS — N184 Chronic kidney disease, stage 4 (severe): Secondary | ICD-10-CM | POA: Diagnosis present

## 2023-04-19 DIAGNOSIS — R809 Proteinuria, unspecified: Secondary | ICD-10-CM | POA: Diagnosis present

## 2023-04-19 DIAGNOSIS — M6281 Muscle weakness (generalized): Secondary | ICD-10-CM | POA: Diagnosis present

## 2023-04-19 DIAGNOSIS — I890 Lymphedema, not elsewhere classified: Secondary | ICD-10-CM | POA: Diagnosis present

## 2023-04-19 DIAGNOSIS — M068A Other specified rheumatoid arthritis, other specified site: Secondary | ICD-10-CM | POA: Diagnosis present

## 2023-04-19 DIAGNOSIS — I700 Atherosclerosis of aorta: Secondary | ICD-10-CM | POA: Diagnosis not present

## 2023-04-19 DIAGNOSIS — R109 Unspecified abdominal pain: Secondary | ICD-10-CM | POA: Diagnosis not present

## 2023-04-19 DIAGNOSIS — E871 Hypo-osmolality and hyponatremia: Secondary | ICD-10-CM | POA: Diagnosis not present

## 2023-04-19 DIAGNOSIS — M488X6 Other specified spondylopathies, lumbar region: Secondary | ICD-10-CM | POA: Diagnosis present

## 2023-04-19 DIAGNOSIS — E782 Mixed hyperlipidemia: Secondary | ICD-10-CM | POA: Diagnosis present

## 2023-04-19 DIAGNOSIS — M353 Polymyalgia rheumatica: Secondary | ICD-10-CM | POA: Diagnosis present

## 2023-04-19 DIAGNOSIS — R079 Chest pain, unspecified: Secondary | ICD-10-CM | POA: Diagnosis present

## 2023-04-19 DIAGNOSIS — M1009 Idiopathic gout, multiple sites: Secondary | ICD-10-CM | POA: Diagnosis present

## 2023-04-19 DIAGNOSIS — Z9049 Acquired absence of other specified parts of digestive tract: Secondary | ICD-10-CM | POA: Diagnosis not present

## 2023-04-19 LAB — BASIC METABOLIC PANEL
Anion gap: 10 (ref 5–15)
BUN: 50 mg/dL — ABNORMAL HIGH (ref 8–23)
CO2: 27 mmol/L (ref 22–32)
Calcium: 8.9 mg/dL (ref 8.9–10.3)
Chloride: 101 mmol/L (ref 98–111)
Creatinine, Ser: 2.02 mg/dL — ABNORMAL HIGH (ref 0.44–1.00)
GFR, Estimated: 25 mL/min — ABNORMAL LOW (ref >60)
Glucose, Bld: 88 mg/dL (ref 70–99)
Potassium: 3.7 mmol/L (ref 3.5–5.1)
Sodium: 138 mmol/L (ref 135–145)

## 2023-04-19 LAB — ECHOCARDIOGRAM COMPLETE
AR max vel: 1.3 cm2
AV Area VTI: 1.32 cm2
AV Area mean vel: 1.37 cm2
AV Mean grad: 8 mmHg
AV Peak grad: 13.2 mmHg
Ao pk vel: 1.82 m/s
Area-P 1/2: 2.95 cm2
Height: 61 in
S' Lateral: 1.9 cm
Weight: 4028.25 oz

## 2023-04-19 LAB — CBC
HCT: 31.8 % — ABNORMAL LOW (ref 36.0–46.0)
Hemoglobin: 10.1 g/dL — ABNORMAL LOW (ref 12.0–15.0)
MCH: 29.5 pg (ref 26.0–34.0)
MCHC: 31.8 g/dL (ref 30.0–36.0)
MCV: 93 fL (ref 80.0–100.0)
Platelets: 223 10*3/uL (ref 150–400)
RBC: 3.42 MIL/uL — ABNORMAL LOW (ref 3.87–5.11)
RDW: 16.9 % — ABNORMAL HIGH (ref 11.5–15.5)
WBC: 8.1 10*3/uL (ref 4.0–10.5)
nRBC: 0 % (ref 0.0–0.2)

## 2023-04-19 LAB — HEPARIN LEVEL (UNFRACTIONATED)
Heparin Unfractionated: 0.86 IU/mL — ABNORMAL HIGH (ref 0.30–0.70)
Heparin Unfractionated: 0.7 IU/mL (ref 0.30–0.70)
Heparin Unfractionated: 0.6 IU/mL (ref 0.30–0.70)

## 2023-04-19 MED ORDER — HYDROCODONE-ACETAMINOPHEN 5-325 MG PO TABS
1.0000 | ORAL_TABLET | Freq: Three times a day (TID) | ORAL | Status: DC | PRN
  Administered 2023-04-19: 1 via ORAL
  Filled 2023-04-19: qty 1

## 2023-04-19 MED ORDER — PERFLUTREN LIPID MICROSPHERE
1.0000 mL | INTRAVENOUS | Status: AC | PRN
  Administered 2023-04-19: 3 mL via INTRAVENOUS

## 2023-04-19 MED ORDER — PREDNISONE 20 MG PO TABS
10.0000 mg | ORAL_TABLET | Freq: Every day | ORAL | Status: DC
  Administered 2023-04-19 – 2023-05-01 (×13): 10 mg via ORAL
  Filled 2023-04-19 (×13): qty 1

## 2023-04-19 MED ORDER — TORSEMIDE 20 MG PO TABS: 40.0000 mg | ORAL_TABLET | Freq: Two times a day (BID) | ORAL | Status: DC

## 2023-04-19 MED ORDER — FUROSEMIDE 10 MG/ML IJ SOLN
40.0000 mg | Freq: Two times a day (BID) | INTRAMUSCULAR | Status: DC
  Administered 2023-04-19 – 2023-04-21 (×5): 40 mg via INTRAVENOUS
  Filled 2023-04-19 (×2): qty 4

## 2023-04-19 MED ORDER — MELATONIN 3 MG PO TABS
6.0000 mg | ORAL_TABLET | Freq: Once | ORAL | Status: AC
  Administered 2023-04-19: 6 mg via ORAL
  Filled 2023-04-19: qty 2

## 2023-04-19 MED ORDER — GABAPENTIN 300 MG PO CAPS
300.0000 mg | ORAL_CAPSULE | Freq: Two times a day (BID) | ORAL | Status: DC
  Administered 2023-04-19: 300 mg via ORAL
  Filled 2023-04-19: qty 1

## 2023-04-19 MED ORDER — HYDROCODONE-ACETAMINOPHEN 5-325 MG PO TABS
1.0000 | ORAL_TABLET | Freq: Four times a day (QID) | ORAL | Status: DC | PRN
  Administered 2023-04-19 – 2023-04-30 (×33): 1 via ORAL
  Filled 2023-04-19 (×36): qty 1

## 2023-04-19 MED ORDER — GABAPENTIN 300 MG PO CAPS
300.0000 mg | ORAL_CAPSULE | Freq: Three times a day (TID) | ORAL | Status: DC
  Administered 2023-04-19 – 2023-05-01 (×35): 300 mg via ORAL
  Filled 2023-04-19 (×35): qty 1

## 2023-04-19 NOTE — Progress Notes (Signed)
*  PRELIMINARY RESULTS* Echocardiogram 2D Echocardiogram has been performed with [facility].  Stacey Drain 04/19/2023, 10:33 AM

## 2023-04-19 NOTE — Progress Notes (Signed)
PROGRESS NOTE  Nicole [name] QMV:[id number] DOB: 1946-06-15 DOA: 04/18/2023 PCP: [name], PA-C  Brief History:  77 year old female with a history of hypertension, polymyalgia rheumatica on chronic prednisone, rheumatoid arthritis on chronic prednisone, esophageal dysmotility, hyperlipidemia, secondary hyperparathyroidism, temporal arteritis and chronic lower extremity edema presenting with worsening shortness of breath and dyspnea on exertion over the past several weeks. The patient is a difficult historian.  History is supplemented from speaking with the patient's son and reviewed the medical record.  Nevertheless, it appears that the patient has been having shortness of breath and dyspnea on exertion at least for the past year, but it has worsened in the past month.  The patient has chronic lower extremity edema which has been well-documented throughout the medical record, particularly their evaluations from cardiology [facility] Emms) and vascular surgery ([facility]).  Nevertheless, the patient feels that that her edema has been worsened over the past several weeks.  She states that she has been having some chest pressure on and off at rest as well as with movement.  Most of the time, she takes some antacids, and she has relief for several hours before it returns.  In addition she states that belching also seems to help her chest discomfort.  She denies any nausea, vomiting, diarrhea, abdominal pain, fevers, chills.  There is no hematochezia or melena.  Interestingly, she states that she saw her PCP about 3 weeks prior to this admission because of her edema and shortness of breath.  She states that her PCP increased her diuretic to 3 times daily from once daily.  However review of the medical record shows that she had been on torsemide 40 mg twice daily in the past.  In the ED, the patient was afebrile and hemodynamically stable with oxygen saturation 96% room air.  WBC 9.5, hemoglobin 10.3,  platelets 250,000.Sodium 135, potassium 5.2, bicarbonate 26, serum creatinine 2.24.  The patient was started on IV furosemide and given Lokelma.  VQ scan showed a single wedge-shaped perfusion defect in the lateral aspect of the superior right lower lobe consistent with pulmonary embolus.  The patient was started on IV heparin.   Assessment/Plan: Acute pulmonary embolus -Stable on room air presently -04/18/2023 VQ scan--single which showed perfusion defect in the lateral aspect of superior RLL c/w PE -Continue IV heparin -Echocardiogram  Fluid overload -Suspect this is due to progression of her underlying CKD as well as proteinuria and lymphedema -Question diagnosis of  CHF with BNP of 28.0 -Echocardiogram -Urine protein creatinine ratio -CT chest -11/30/2022 office weight 242 pounds -Question accuracy of current weights given patient's inability to stand -start lasix IV 40 mg bid  CKD stage IV -Baseline creatinine 2.0-2.2 -Monitor with diuretics  Chest discomfort -Mostly atypical by history -Primarily due to GI and musculoskeletal etiology -Certainly, her PE may have contributed -Reproducible on palpation -troponin 10>>9 -Echo  Morbid obesity -BMI 47.57 -Lifestyle modification  Physical deconditioning -At baseline, the patient is able to bear weight and make a transfer with assistance  Opioid dependence/chronic pain syndrome -PDMP reviewed -Norco 5/325, #120, last refill 04/13/2023 -Continue gabapentin  Lower extremity edema -Multifactorial including chronic prednisone, progression of CKD, lymphedema -Venous duplex negative for DVT -07/20/2021 echo EF 65 to 70%, no WMA, normal RV function  Polymyalgia rheumatica/rheumatoid arthritis -Patient is on chronic prednisone 10 mg daily -Previously on [place], but stopped secondary to her frequent hospitalizations and infections in the past -Not a candidate for  further Biologics per last visit with  rheumatology  Mixed hyperlipidemia -Continue statin           Family Communication:   son updated 6/27  Consultants:  none  Code Status:  FULL   DVT Prophylaxis:  IV Heparin    Procedures: As Listed in Progress Note Above  Antibiotics: None        Subjective: Patient still has some dyspnea on exertion.  She denies any chest pain, fevers, chills, nausea, vomiting, diarrhea, abdominal pain.  She denies any headache or neck pain.  Objective: Vitals:   04/18/23 2006 04/18/23 2357 04/19/23 0313 04/19/23 0500  BP: (!) 141/70 121/76 (!) 111/59   Pulse: 78 95 93   Resp: 18 18 18    Temp: 98.5 F (36.9 C) 98.1 F (36.7 C) 98.4 F (36.9 C)   TempSrc: Oral Oral Oral   SpO2: 96% 96% 96%   Weight:    114.2 kg  Height:        Intake/Output Summary (Last 24 hours) at 04/19/2023 0711 Last data filed at 04/19/2023 0514 Gross per 24 hour  Intake 240 ml  Output 750 ml  Net -510 ml   Weight change:  Exam:  General:  Pt is alert, follows commands appropriately, not in acute distress HEENT: No icterus, No thrush, No neck mass, [place]/AT Cardiovascular: RRR, S1/S2, no rubs, no gallops Respiratory: Bibasal crackles but no wheezing.  Good air movement Abdomen: Soft/+BS, non tender, non distended, no guarding Extremities: 2 + LE edema, No lymphangitis, No petechiae, No rashes, no synovitis   Data Reviewed: I have personally reviewed following labs and imaging studies Basic Metabolic Panel: Recent Labs  Lab 04/18/23 1235 04/19/23 0303  NA 135 138  K 5.2* 3.7  CL 97* 101  CO2 26 27  GLUCOSE 110* 88  BUN 62* 50*  CREATININE 2.24* 2.02*  CALCIUM 9.0 8.9   Liver Function Tests: No results for input(s): "AST", "ALT", "ALKPHOS", "BILITOT", "PROT", "ALBUMIN" in the last 168 hours. No results for input(s): "LIPASE", "AMYLASE" in the last 168 hours. No results for input(s): "AMMONIA" in the last 168 hours. Coagulation Profile: No results for input(s): "INR",  "PROTIME" in the last 168 hours. CBC: Recent Labs  Lab 04/18/23 1235 04/19/23 0303  WBC 9.5 8.1  HGB 10.3* 10.1*  HCT 33.0* 31.8*  MCV 94.3 93.0  PLT 260 223   Cardiac Enzymes: No results for input(s): "CKTOTAL", "CKMB", "CKMBINDEX", "TROPONINI" in the last 168 hours. BNP: Invalid input(s): "POCBNP" CBG: No results for input(s): "GLUCAP" in the last 168 hours. HbA1C: No results for input(s): "HGBA1C" in the last 72 hours. Urine analysis:    Component Value Date/Time   COLORURINE YELLOW 01/18/2022 1425   APPEARANCEUR CLEAR 01/18/2022 1425   LABSPEC <1.005 (L) 01/18/2022 1425   PHURINE 6.0 01/18/2022 1425   GLUCOSEU NEGATIVE 01/18/2022 1425   HGBUR TRACE (A) 01/18/2022 1425   BILIRUBINUR NEGATIVE 01/18/2022 1425   BILIRUBINUR negative 02/14/2020 1328   KETONESUR NEGATIVE 01/18/2022 1425   PROTEINUR NEGATIVE 01/18/2022 1425   UROBILINOGEN 0.2 02/14/2020 1328   UROBILINOGEN 0.2 02/20/2014 1116   NITRITE NEGATIVE 01/18/2022 1425   LEUKOCYTESUR LARGE (A) 01/18/2022 1425   Sepsis Labs: @LABRCNTIP (procalcitonin:4,lacticidven:4) )No results found for this or any previous visit (from the past 240 hour(s)).   Scheduled Meds:  atorvastatin  20 mg Oral q morning   furosemide  40 mg Intravenous BID   Continuous Infusions:  heparin 1,200 Units/hr (04/19/23 0445)    Procedures/Studies: NM Pulmonary  Perfusion  Result Date: 04/18/2023 CLINICAL DATA:  Pulmonary embolism (PE) suspected, low to intermediate prob, positive D-dimer EXAM: NUCLEAR MEDICINE PERFUSION LUNG SCAN TECHNIQUE: Perfusion images were obtained in multiple projections after intravenous injection of radiopharmaceutical. Ventilation scans intentionally deferred if perfusion scan and chest x-ray adequate for interpretation during COVID 19 epidemic. RADIOPHARMACEUTICALS:  4.4 mCi Tc-22m MAA IV COMPARISON:  04/18/2023 FINDINGS: Planar images of the lungs are obtained in multiple projections during the perfusion phase of  the exam. There is a single wedge-shaped subsegmental perfusion defect within the lateral aspect of the superior segment right lower lobe. No other perfusion defects. IMPRESSION: 1. Single wedge-shaped perfusion defect within the lateral aspect superior segment right lower lobe, compatible with pulmonary embolus based on PISAPED criteria. Critical Value/emergent results were called by telephone at the time of interpretation on 04/18/2023 at 5:27pm to provider Dr. Charm Barges, who verbally acknowledged these results. Electronically Signed   By: Sharlet Salina M.D.   On: 04/18/2023 17:42   US Venous Img Lower Bilateral (DVT)  Result Date: 04/18/2023 CLINICAL DATA:  Bilateral edema, worsening right.  Pain EXAM: BILATERAL LOWER EXTREMITY VENOUS DOPPLER ULTRASOUND TECHNIQUE: Gray-scale sonography with compression, as well as color and duplex ultrasound, were performed to evaluate the deep venous system(s) from the level of the common femoral vein through the popliteal and proximal calf veins. COMPARISON:  08/29/2007 FINDINGS: VENOUS Normal compressibility of the common femoral, superficial femoral, and popliteal veins, as well as the visualized calf veins. Visualized portions of profunda femoral vein and great saphenous vein unremarkable. No filling defects to suggest DVT on grayscale or color Doppler imaging. Doppler waveforms show normal direction of venous flow, normal respiratory plasticity and response to augmentation. OTHER Marked subcutaneous edema in the distal left calf. Limitations: Technologist describes technically difficult study secondary to body habitus, pitting edema, and patient pain intolerance limiting calf vein evaluation. IMPRESSION: No femoropopliteal DVT . If clinical symptoms are inconsistent or if there are persistent or worsening symptoms, further imaging (possibly involving the iliac veins) may be warranted. Electronically Signed   By: Corlis Leak M.D.   On: 04/18/2023 15:09   DG Chest 2  View  Result Date: 04/18/2023 CLINICAL DATA:  Chest pain EXAM: CHEST - 2 VIEW COMPARISON:  03/27/2019 FINDINGS: Underinflation. Bronchovascular crowding. Borderline cardiopericardial silhouette with a calcified aorta. Left midlung scar or atelectasis is once again identified. No pneumothorax or effusion. Overlapping cardiac leads. Degenerative changes of the shoulders and spine. Under penetrated radiograph IMPRESSION: Underinflation with bronchovascular crowding. Stable left midlung scar or atelectasis. Electronically Signed   By: Karen Kays M.D.   On: 04/18/2023 13:06   DG Chest 2 View  Result Date: 04/01/2023 CLINICAL DATA:  Chest pain. EXAM: CHEST - 2 VIEW COMPARISON:  Chest radiograph 12/25/2021 FINDINGS: Cardiomegaly. Aortic tortuosity. Bandlike opacity left mid lung. Low lung volumes. No pleural effusion or pneumothorax. Extensive bilateral shoulder joint degenerative changes. IMPRESSION: Bandlike opacity left mid lung favored to represent atelectasis. Electronically Signed   By: Annia Belt M.D.   On: 04/01/2023 21:25    Catarina Hartshorn, DO  [facility]  If 7PM-7AM, please contact night-coverage [email or web address] Password [email or web address] 04/19/2023, 7:11 AM   LOS: 0 days

## 2023-04-19 NOTE — Progress Notes (Signed)
ANTICOAGULATION CONSULT NOTE - Follow Up Consult  Pharmacy Consult for heparin Indication: pulmonary embolus  Allergies  Allergen Reactions   Oxycontin [Oxycodone Hcl] Swelling    Pt tolerates hydromorphone.   Sulfa Antibiotics Other (See Comments)    Sores   Tramadol     "Makes me feel like I am falling"   Penicillins Rash    Tolerated augmentin    Sulfasalazine Other (See Comments)    Sores    Patient Measurements: Height: 5\' 1"  (154.9 cm) Weight: 114.2 kg (251 lb 12.3 oz) IBW/kg (Calculated) : 47.8 Heparin Dosing Weight: 76.4  Vital Signs: Temp: 98.7 F (37.1 C) (06/27 1955) Temp Source: Axillary (06/27 1955) BP: 131/61 (06/27 1955) Pulse Rate: 83 (06/27 1955)  Labs: Recent Labs    04/18/23 1235 04/18/23 1505 04/19/23 0303 04/19/23 1224 04/19/23 2140  HGB 10.3*  --  10.1*  --   --   HCT 33.0*  --  31.8*  --   --   PLT 260  --  223  --   --   HEPARINUNFRC  --   --  0.86* 0.70 0.60  CREATININE 2.24*  --  2.02*  --   --   TROPONINIHS 10 9  --   --   --      Estimated Creatinine Clearance: 27.8 mL/min (A) (by C-G formula based on SCr of 2.02 mg/dL (H)).   Medical History: Past Medical History:  Diagnosis Date   AC (acromioclavicular) joint bone spurs    lt shoulder   Acute kidney failure (HCC)    Anemia    Arthritis    Carpal tunnel syndrome, bilateral    CHF (congestive heart failure) (HCC)    Chronic kidney disease    Chronic pain syndrome    Cognitive communication deficit    Collagen vascular disease (HCC)    Dysphagia    Gastroesophageal reflux    Headache    recent visit to ER @ [facility] for severe headache   Hypertension    Lumbar stenosis    Hx of ESIs by Dr. Ethelene Hal   Major depressive disorder    Metabolic encephalopathy    Muscle weakness    Polyarthralgia    Polymyalgia (HCC)    Shingles    Spinal stenosis     Medications:  Medications Prior to Admission  Medication Sig Dispense Refill Last Dose   acetaminophen (TYLENOL)  325 MG tablet Take 2 tablets (650 mg total) by mouth every 6 (six) hours as needed for mild pain or headache (or Fever >/= 101).   UNK   allopurinol (ZYLOPRIM) 100 MG tablet Take 100 mg by mouth daily.   04/18/2023   atorvastatin (LIPITOR) 20 MG tablet Take 20 mg by mouth every morning.   04/18/2023   citalopram (CELEXA) 20 MG tablet Take 20 mg by mouth daily.   04/18/2023   gabapentin (NEURONTIN) 300 MG capsule Take 300 mg by mouth 3 (three) times daily.   04/18/2023   meloxicam (MOBIC) 7.5 MG tablet Take 7.5 mg by mouth every morning.   04/18/2023   metoprolol tartrate (LOPRESSOR) 25 MG tablet Take 25 mg by mouth 2 (two) times daily.   04/18/2023 at AM   montelukast (SINGULAIR) 10 MG tablet Take 10 mg by mouth at bedtime.   04/18/2023   predniSONE (DELTASONE) 10 MG tablet Take 10 mg by mouth daily with breakfast.   04/18/2023   spironolactone (ALDACTONE) 25 MG tablet Take 25 mg by mouth daily.   04/18/2023  tamsulosin (FLOMAX) 0.4 MG CAPS capsule Take 0.4 mg by mouth daily after breakfast.   04/18/2023   Torsemide 40 MG TABS Take 40 mg by mouth 2 (two) times daily. Dose to be further adjusted and medication resume when renal function fully recovered and patient able to tolerate PO's.   04/18/2023   traZODone (DESYREL) 50 MG tablet Take 25 mg by mouth at bedtime.   04/17/2023   trolamine salicylate (ASPERCREME) 10 % cream Apply 1 application  topically as needed for muscle pain.   UNK   calcitRIOL (ROCALTROL) 0.25 MCG capsule Take 0.25 mcg by mouth See admin instructions. Take 0.25 every Mon, Wed, Fri. (Patient not taking: Reported on 08/02/2022)   Not Taking   colchicine 0.6 MG tablet Take 1 tablet (0.6 mg total) by mouth daily. Take 1.2mg  (2 tablets) then 0.6mg  (1 tablet) 1 hour after. Then, take 1 tablet every day for 7 days. (Patient not taking: Reported on 04/19/2023) 10 tablet 2 Completed Course   predniSONE (DELTASONE) 20 MG tablet Take 1 tablet (20 mg total) by mouth daily with breakfast for 5 days.  (Patient not taking: Reported on 04/19/2023) 5 tablet 0 Completed Course    Assessment: 77 yo female with history of morbid obesity and HTN presents after several days of chest pain. Patient with previously high troponin per note. Patient instructed to present to the ED for evaluation for persistent chest pain. D-dimer elevated as well as elevated SCR of 2.24. Dopplers for DVT were negative, but patient with VQ scan positive for a RLL perfusion defect compatible with pulmonary embolism. Hgb 10.3, PLT 260. No PTA anticoagulation noted in med list or ED notes.   6/27 PM update:  Heparin level therapeutic after small rate decrease  Goal of Therapy:  Heparin level 0.3-0.7 units/ml Monitor platelets by anticoagulation protocol: Yes   Plan:  Cont heparin 1150 units/hr Heparin level and CBC with AM labs  Abran Duke, PharmD, BCPS Clinical Pharmacist Phone: [phone]

## 2023-04-19 NOTE — TOC Initial Note (Signed)
Transition of Care [facility]) - Initial/Assessment Note    Patient Details  Name: Nicole [name] MRN: [id number] Date of Birth: 1946/06/14  Transition of [facility]) CM/SW Contact:    Elliot Gault, LCSW Phone Number: 04/19/2023, 11:35 AM  Clinical Narrative:                  Pt admitted from home. Received TOC consult for CHF. Spoke with pt today to assess. Pt reports that she lives alone and has a private caregiver from 6:00AM-12:00PM and from 530PM-930PM every day of the week. Caregiver assists with ADLs, transportation to appointments, etc. Pt states she is able to obtain medications. She has a cane and wheelchair for DME. Pt states she has also been active with [facility] for PT.  Discussed CHF diagnosis with pt who states that she does not know much about it and has not been following a heart healthy diet or weighing daily. Provided education and added CHF information to pt's AVS.  MD anticipating dc over the weekend. Will resume HH services for pt at dc and assist with any other needs that arise.  Expected Discharge Plan: Home w Home Health Services Barriers to Discharge: Continued Medical Work up   Patient Goals and CMS Choice Patient states their goals for this hospitalization and ongoing recovery are:: return home          Expected Discharge Plan and Services In-house Referral: Clinical Social Work   Post Acute Care Choice: Resumption of Svcs/PTA Provider Living arrangements for the past 2 months: Single Family Home                                      Prior Living Arrangements/Services Living arrangements for the past 2 months: Single Family Home Lives with:: Self Patient language and need for interpreter reviewed:: Yes Do you feel safe going back to the place where you live?: Yes      Need for Family Participation in Patient Care: No (Comment) Care giver support system in place?: Yes (comment) Current home services: DME, Other (comment), Home PT  (caregiver) Criminal Activity/Legal Involvement Pertinent to Current Situation/Hospitalization: No - Comment as needed  Activities of Daily Living      Permission Sought/Granted                  Emotional Assessment Appearance:: Appears stated age Attitude/Demeanor/Rapport: Engaged Affect (typically observed): Pleasant Orientation: : Oriented to Self, Oriented to Place, Oriented to  Time, Oriented to Situation Alcohol / Substance Use: Not Applicable Psych Involvement: No (comment)  Admission diagnosis:  Pulmonary embolism (HCC) [I26.99] Acute pulmonary embolism without acute cor pulmonale, unspecified pulmonary embolism type (HCC) [I26.99] Patient Active Problem List   Diagnosis Date Noted   Opioid dependence (HCC) 04/19/2023   Pulmonary embolism (HCC) 04/18/2023   CKD (chronic kidney disease), stage IV (HCC) 04/18/2023   Acute on chronic diastolic CHF (congestive heart failure) (HCC) 04/18/2023   Encounter for feeding tube placement    Pressure injury of skin 12/21/2021   Acute renal failure (ARF) (HCC) 12/16/2021   Acute urinary retention 12/16/2021   Leg wound, left 12/16/2021   UTI (urinary tract infection) 12/16/2021   Dysphagia    Psoas abscess (HCC) 10/05/2021   Intractable nausea and vomiting 09/24/2021   Myositis 09/16/2021   Psoas abscess, left (2.0 cm on MRI)  09/16/2021   AKI (acute kidney injury) (HCC) 09/15/2021  Acute metabolic encephalopathy 07/20/2021   Hypokalemia 07/20/2021   Elevated MCV 07/20/2021   Mixed hyperlipidemia 07/20/2021   S/P lumbar laminectomy 07/31/2018   Wound infection after surgery 07/31/2018   Acute pulmonary embolism (HCC) 07/06/2018   Acute saddle pulmonary embolism with acute cor pulmonale (HCC) 07/06/2018   Diverticulitis 05/02/2018   Diverticulitis of colon 05/01/2018   Obesity, Class III, BMI 40-49.9 (morbid obesity) (HCC) 05/01/2018   Chronic diastolic HF (heart failure) (HCC) 05/01/2018   Long term (current) use of  opiate analgesic 11/06/2017   On prednisone therapy 05/31/2017   Positive anti-CCP test 05/31/2017   Rheumatoid factor positive 05/31/2017   Memory loss 04/04/2017   Polymyalgia rheumatica (HCC) 04/04/2017   High risk medication use 12/07/2016   Dry eye syndrome, bilateral 12/07/2016   Nuclear age-related cataract, both eyes 12/07/2016   Rheumatoid arthritis (HCC) 12/07/2016   DDD (degenerative disc disease), lumbar 09/27/2016   HTN (hypertension) 06/05/2016   Chronic pain syndrome 06/05/2016   Acute diverticulitis 06/02/2016   Diverticulitis large intestine 06/02/2016   Spinal stenosis of lumbosacral region 02/29/2016   Joint pain 02/29/2016   Endometrial polyp 02/06/2014   Postmenopausal bleeding 01/30/2014   GERD (gastroesophageal reflux disease) 09/29/2013   Spinal stenosis, thoracic 09/29/2013   Shingles 09/29/2013   Thoracic or lumbosacral neuritis or radiculitis, unspecified 05/04/2011   Abnormality of gait 05/04/2011   Muscle weakness (generalized) 05/04/2011   Bilateral primary osteoarthritis of knee 04/07/2009   KNEE PAIN 04/07/2009   PCP:  [name], PA-C Pharmacy:   [facility] - [place], [place] - [address] [place] [address] Phone: [phone] Fax: [phone]  [facility] - [place], [place] - [address] [place] [address] Phone: [phone] Fax: [phone]     Social Determinants of Health (SDOH) Social History: SDOH Screenings   Food Insecurity: No Food Insecurity (04/19/2023)  Housing: Low Risk  (04/19/2023)  Transportation Needs: No Transportation Needs (04/19/2023)  Utilities: Not At Risk (04/19/2023)  Depression (PHQ2-9): Low Risk  (04/15/2020)  Tobacco Use: Low Risk  (04/18/2023)   SDOH Interventions:     Readmission Risk Interventions    12/23/2021   11:10 AM 10/04/2021    8:38 AM 09/26/2021   11:14 AM  Readmission Risk Prevention Plan  Transportation  Screening Complete Complete Complete  HRI or Home Care Consult  Complete Complete  Social Work Consult for Recovery Care Planning/Counseling  Complete Complete  Palliative Care Screening  Not Applicable Not Applicable  Medication Review Oceanographer) Complete Complete Complete  HRI or Home Care Consult Complete    SW Recovery Care/Counseling Consult Complete    Palliative Care Screening Not Applicable    Skilled Nursing Facility Not Applicable

## 2023-04-19 NOTE — Hospital Course (Addendum)
Mrs. Nicole [name] was admitted to the hospital with the working diagnosis of pulmonary embolism.   77 year old female with a history of hypertension, polymyalgia rheumatica on chronic prednisone, rheumatoid arthritis on chronic prednisone, esophageal dysmotility, hyperlipidemia, secondary hyperparathyroidism, temporal arteritis and chronic lower extremity edema presenting with worsening shortness of breath and dyspnea on exertion over the past several weeks. She has chronic edema but feels that that her edema has been worsened over the past several weeks.    In the ED, the patient was afebrile and hemodynamically stable with oxygen saturation 96% room air.  WBC 9.5, hemoglobin 10.3, platelets 250,000.Sodium 135, potassium 5.2, bicarbonate 26, serum creatinine 2.24.  T  The patient was started on IV furosemide and given Lokelma.   VQ scan showed a single wedge-shaped perfusion defect in the lateral aspect of the superior right lower lobe consistent with pulmonary embolus.   The patient was started on IV heparin. She was subsequently transitioned to po apixaban.    Unfortunately, she developed a spontaneous rectus sheath hematoma.  Her anticoagulation was stopped and risks/benefits/alternatives were discussed with pt and family.  She was monitored off anticoagulation and she remained clinically stable.  Hematology was consulted for opinion.  07/03 follow up abdominal CT with worsening bleeding.  07/05 renal function has improved, plan for contrast CT chest.

## 2023-04-19 NOTE — Progress Notes (Signed)
ANTICOAGULATION CONSULT NOTE - Follow Up Consult  Pharmacy Consult for heparin Indication: pulmonary embolus  Allergies  Allergen Reactions   Oxycontin [Oxycodone Hcl] Swelling    Pt tolerates hydromorphone.   Sulfa Antibiotics Other (See Comments)    Sores   Tramadol     "Makes me feel like I am falling"   Penicillins Rash    Tolerated augmentin    Sulfasalazine Other (See Comments)    Sores    Patient Measurements: Height: 5\' 1"  (154.9 cm) Weight: 114.2 kg (251 lb 12.3 oz) IBW/kg (Calculated) : 47.8 Heparin Dosing Weight: 76.4  Vital Signs: Temp: 98.4 F (36.9 C) (06/27 0313) Temp Source: Oral (06/27 0313) BP: 111/59 (06/27 0313) Pulse Rate: 93 (06/27 0313)  Labs: Recent Labs    04/18/23 1235 04/18/23 1505 04/19/23 0303 04/19/23 1224  HGB 10.3*  --  10.1*  --   HCT 33.0*  --  31.8*  --   PLT 260  --  223  --   HEPARINUNFRC  --   --  0.86* 0.70  CREATININE 2.24*  --  2.02*  --   TROPONINIHS 10 9  --   --      Estimated Creatinine Clearance: 27.8 mL/min (A) (by C-G formula based on SCr of 2.02 mg/dL (H)).   Medical History: Past Medical History:  Diagnosis Date   AC (acromioclavicular) joint bone spurs    lt shoulder   Acute kidney failure (HCC)    Anemia    Arthritis    Carpal tunnel syndrome, bilateral    CHF (congestive heart failure) (HCC)    Chronic kidney disease    Chronic pain syndrome    Cognitive communication deficit    Collagen vascular disease (HCC)    Dysphagia    Gastroesophageal reflux    Headache    recent visit to ER @ [facility] for severe headache   Hypertension    Lumbar stenosis    Hx of ESIs by Dr. Ethelene Hal   Major depressive disorder    Metabolic encephalopathy    Muscle weakness    Polyarthralgia    Polymyalgia (HCC)    Shingles    Spinal stenosis     Medications:  Medications Prior to Admission  Medication Sig Dispense Refill Last Dose   acetaminophen (TYLENOL) 325 MG tablet Take 2 tablets (650 mg total) by  mouth every 6 (six) hours as needed for mild pain or headache (or Fever >/= 101).   UNK   allopurinol (ZYLOPRIM) 100 MG tablet Take 100 mg by mouth daily.   04/18/2023   atorvastatin (LIPITOR) 20 MG tablet Take 20 mg by mouth every morning.   04/18/2023   citalopram (CELEXA) 20 MG tablet Take 20 mg by mouth daily.   04/18/2023   gabapentin (NEURONTIN) 300 MG capsule Take 300 mg by mouth 3 (three) times daily.   04/18/2023   meloxicam (MOBIC) 7.5 MG tablet Take 7.5 mg by mouth every morning.   04/18/2023   metoprolol tartrate (LOPRESSOR) 25 MG tablet Take 25 mg by mouth 2 (two) times daily.   04/18/2023 at AM   montelukast (SINGULAIR) 10 MG tablet Take 10 mg by mouth at bedtime.   04/18/2023   predniSONE (DELTASONE) 10 MG tablet Take 10 mg by mouth daily with breakfast.   04/18/2023   spironolactone (ALDACTONE) 25 MG tablet Take 25 mg by mouth daily.   04/18/2023   tamsulosin (FLOMAX) 0.4 MG CAPS capsule Take 0.4 mg by mouth daily after breakfast.  04/18/2023   Torsemide 40 MG TABS Take 40 mg by mouth 2 (two) times daily. Dose to be further adjusted and medication resume when renal function fully recovered and patient able to tolerate PO's.   04/18/2023   traZODone (DESYREL) 50 MG tablet Take 25 mg by mouth at bedtime.   04/17/2023   trolamine salicylate (ASPERCREME) 10 % cream Apply 1 application  topically as needed for muscle pain.   UNK   calcitRIOL (ROCALTROL) 0.25 MCG capsule Take 0.25 mcg by mouth See admin instructions. Take 0.25 every Mon, Wed, Fri. (Patient not taking: Reported on 08/02/2022)   Not Taking   colchicine 0.6 MG tablet Take 1 tablet (0.6 mg total) by mouth daily. Take 1.2mg  (2 tablets) then 0.6mg  (1 tablet) 1 hour after. Then, take 1 tablet every day for 7 days. (Patient not taking: Reported on 04/19/2023) 10 tablet 2 Completed Course   predniSONE (DELTASONE) 20 MG tablet Take 1 tablet (20 mg total) by mouth daily with breakfast for 5 days. (Patient not taking: Reported on 04/19/2023) 5  tablet 0 Completed Course    Assessment: 77 yo female with history of morbid obesity and HTN presents after several days of chest pain. Patient with previously high troponin per note. Patient instructed to present to the ED for evaluation for persistent chest pain. D-dimer elevated as well as elevated SCR of 2.24. Dopplers for DVT were negative, but patient with VQ scan positive for a RLL perfusion defect compatible with pulmonary embolism. Hgb 10.3, PLT 260. No PTA anticoagulation noted in med list or ED notes.   HL 0.70    Goal of Therapy:  Heparin level 0.3-0.7 units/ml Monitor platelets by anticoagulation protocol: Yes   Plan:  Decrease heparin drip to 1150 units/hr Recheck heparin level in 8 hours.  CBC daily Monitor for bleeding  Judeth Cornfield, PharmD Clinical Pharmacist 04/19/2023 1:45 PM

## 2023-04-19 NOTE — Progress Notes (Signed)
Unable to obtain ReDs clip reading. MD notified.

## 2023-04-19 NOTE — Progress Notes (Addendum)
ANTICOAGULATION CONSULT NOTE - Follow Up Consult  Pharmacy Consult for heparin Indication: pulmonary embolus  Allergies  Allergen Reactions   Oxycontin [Oxycodone Hcl] Swelling    Pt tolerates hydromorphone.   Sulfa Antibiotics Other (See Comments)    Sores   Tramadol     "Makes me feel like I am falling"   Penicillins Rash    Tolerated augmentin    Sulfasalazine Other (See Comments)    Sores    Patient Measurements: Height: 5\' 1"  (154.9 cm) Weight: 115.3 kg (254 lb 1.6 oz) IBW/kg (Calculated) : 47.8 Heparin Dosing Weight: 76.4  Vital Signs: Temp: 98.4 F (36.9 C) (06/27 0313) Temp Source: Oral (06/27 0313) BP: 111/59 (06/27 0313) Pulse Rate: 93 (06/27 0313)  Labs: Recent Labs    04/18/23 1235 04/18/23 1505 04/19/23 0303  HGB 10.3*  --  10.1*  HCT 33.0*  --  31.8*  PLT 260  --  223  HEPARINUNFRC  --   --  0.86*  CREATININE 2.24*  --  2.02*  TROPONINIHS 10 9  --      Estimated Creatinine Clearance: 28 mL/min (A) (by C-G formula based on SCr of 2.02 mg/dL (H)).   Medical History: Past Medical History:  Diagnosis Date   AC (acromioclavicular) joint bone spurs    lt shoulder   Acute kidney failure (HCC)    Anemia    Arthritis    Carpal tunnel syndrome, bilateral    CHF (congestive heart failure) (HCC)    Chronic kidney disease    Chronic pain syndrome    Cognitive communication deficit    Collagen vascular disease (HCC)    Dysphagia    Gastroesophageal reflux    Headache    recent visit to ER @ [facility] for severe headache   Hypertension    Lumbar stenosis    Hx of ESIs by Dr. Ethelene Hal   Major depressive disorder    Metabolic encephalopathy    Muscle weakness    Polyarthralgia    Polymyalgia (HCC)    Shingles    Spinal stenosis     Medications:  Medications Prior to Admission  Medication Sig Dispense Refill Last Dose   acetaminophen (TYLENOL) 325 MG tablet Take 2 tablets (650 mg total) by mouth every 6 (six) hours as needed for mild pain  or headache (or Fever >/= 101).      alfuzosin (UROXATRAL) 10 MG 24 hr tablet Take 1 tablet (10 mg total) by mouth at bedtime. (Patient not taking: Reported on 08/02/2022) 30 tablet 11    ascorbic acid (VITAMIN C) 500 MG tablet Take 1 tablet (500 mg total) by mouth 2 (two) times daily.      atorvastatin (LIPITOR) 20 MG tablet Take 20 mg by mouth every morning.      calcitRIOL (ROCALTROL) 0.25 MCG capsule Take 0.25 mcg by mouth See admin instructions. Take 0.25 every Mon, Wed, Fri. (Patient not taking: Reported on 08/02/2022)      celecoxib (CELEBREX) 100 MG capsule Take 100 mg by mouth daily. (Patient not taking: Reported on 08/02/2022)      cholecalciferol (VITAMIN D3) 25 MCG (1000 UNIT) tablet Take by mouth.      citalopram (CELEXA) 20 MG tablet Take 20 mg by mouth daily.      colchicine 0.6 MG tablet Take 1 tablet (0.6 mg total) by mouth daily. Take 1.2mg  (2 tablets) then 0.6mg  (1 tablet) 1 hour after. Then, take 1 tablet every day for 7 days. 10 tablet 2  cyclobenzaprine (FLEXERIL) 10 MG tablet Take 10 mg by mouth 3 (three) times daily as needed for muscle spasms. (Patient not taking: Reported on 08/02/2022)      dextrose 5 % solution Inject 75 mL/hr into the vein continuous. (Patient not taking: Reported on 08/02/2022)      diclofenac Sodium (VOLTAREN) 1 % GEL Apply topically 4 (four) times daily. (Patient not taking: Reported on 08/02/2022)      Docusate Sodium (DSS) 100 MG CAPS Take by mouth.      famotidine (PEPCID) 10 MG tablet Take 10 mg by mouth 2 (two) times daily. (Patient not taking: Reported on 08/02/2022)      ferrous sulfate 325 (65 FE) MG tablet Take by mouth.      fluticasone (FLONASE) 50 MCG/ACT nasal spray       folic acid (FOLVITE) 400 MCG tablet Take 800 mcg by mouth daily. (Patient not taking: Reported on 08/02/2022)      furosemide (LASIX) 40 MG tablet       gabapentin (NEURONTIN) 300 MG capsule Take 300 mg by mouth 3 (three) times daily.      Glucosamine HCl  (GLUCOSAMINE PO) Take by mouth daily.      HYDROcodone-acetaminophen (NORCO/VICODIN) 5-325 MG tablet Take 1 tablet by mouth every morning. (Patient not taking: Reported on 08/02/2022)      leflunomide (ARAVA) 10 MG tablet Take by mouth.      lidocaine (ASPERCREME LIDOCAINE) 4 % Place 1 patch onto the skin daily. (Patient not taking: Reported on 08/02/2022)      meloxicam (MOBIC) 7.5 MG tablet Take 7.5 mg by mouth every morning.      methocarbamol (ROBAXIN) 500 MG tablet       metoprolol succinate (TOPROL XL) 25 MG 24 hr tablet Take 1 tablet (25 mg total) by mouth daily. 30 tablet 2    metoprolol tartrate (LOPRESSOR) 25 MG tablet Take 25 mg by mouth 2 (two) times daily.      montelukast (SINGULAIR) 10 MG tablet Take 10 mg by mouth at bedtime.      Mouthwashes (BIOTENE/CALCIUM PBF) LIQD by Transmucosal route.      Multiple Vitamins-Minerals (MULTIVITAMIN PO) Take 1 tablet by mouth daily. Theratrum complete      nystatin (MYCOSTATIN/NYSTOP) powder       pantoprazole (PROTONIX) 40 MG tablet       potassium chloride (KLOR-CON M) 10 MEQ tablet Take by mouth.      potassium chloride (KLOR-CON) 10 MEQ tablet Take 2 tablets (20 mEq total) by mouth daily. 60 tablet 0    pravastatin (PRAVACHOL) 80 MG tablet Take 1 tablet (80 mg total) by mouth daily. Resume at time of discharge when able to tolerate PO's. (Patient not taking: Reported on 05/17/2022)      predniSONE (DELTASONE) 1 MG tablet Take 9 tablets (9 mg total) by mouth daily with breakfast. (Patient not taking: Reported on 08/02/2022) 30 tablet 1    predniSONE (DELTASONE) 10 MG tablet Take 10 mg by mouth daily with breakfast.      predniSONE (DELTASONE) 20 MG tablet Take 1 tablet (20 mg total) by mouth daily with breakfast for 5 days. 5 tablet 0    spironolactone (ALDACTONE) 25 MG tablet Take 25 mg by mouth daily.      tamsulosin (FLOMAX) 0.4 MG CAPS capsule Take 0.4 mg by mouth daily after breakfast.      Tapentadol HCl (NUCYNTA ER) 200 MG TB12 Take  1 tablet every 12 hours by oral route.  Torsemide 40 MG TABS Take 40 mg by mouth 2 (two) times daily. Dose to be further adjusted and medication resume when renal function fully recovered and patient able to tolerate PO's.      traZODone (DESYREL) 50 MG tablet Take 25 mg by mouth at bedtime.      trolamine salicylate (ASPERCREME) 10 % cream Apply 1 application  topically as needed for muscle pain.      zinc sulfate 220 (50 Zn) MG capsule Take 1 capsule (220 mg total) by mouth daily. (Patient not taking: Reported on 08/02/2022)       Assessment: 77 yo female with history of morbid obesity and HTN presents after several days of chest pain. Patient with previously high troponin per note. Patient instructed to present to the ED for evaluation for persistent chest pain. D-dimer elevated as well as elevated SCR of 2.24. Dopplers for DVT were negative, but patient with VQ scan positive for a RLL perfusion defect compatible with pulmonary embolism. Hgb 10.3, PLT 260. No PTA anticoagulation noted in med list or ED notes.   Heparin level supratherapeutic while on heparin 1300u/hr at 0.86. Drawn as a peripheral stick. No issues noted.     Goal of Therapy:  Heparin level 0.3-0.7 units/ml Monitor platelets by anticoagulation protocol: Yes   Plan:  Decrease heparin drip to 1200 units/hr Recheck heparin level in 8 hours.  CBC daily Monitor for bleeding  Estill Batten, PharmD, BCCCP  04/19/2023,4:01 AM

## 2023-04-19 NOTE — Progress Notes (Addendum)
Pt c/o increased "hot like pain" to legs. Legs are not hot to touch, but edematous. MD notified. See new orders. Will continue to monitor.

## 2023-04-20 DIAGNOSIS — F1120 Opioid dependence, uncomplicated: Secondary | ICD-10-CM

## 2023-04-20 DIAGNOSIS — I2699 Other pulmonary embolism without acute cor pulmonale: Secondary | ICD-10-CM | POA: Diagnosis not present

## 2023-04-20 LAB — COMPREHENSIVE METABOLIC PANEL
ALT: 22 U/L (ref 0–44)
AST: 20 U/L (ref 15–41)
Albumin: 3 g/dL — ABNORMAL LOW (ref 3.5–5.0)
Alkaline Phosphatase: 58 U/L (ref 38–126)
Anion gap: 11 (ref 5–15)
BUN: 45 mg/dL — ABNORMAL HIGH (ref 8–23)
CO2: 28 mmol/L (ref 22–32)
Calcium: 8.9 mg/dL (ref 8.9–10.3)
Chloride: 98 mmol/L (ref 98–111)
Creatinine, Ser: 1.94 mg/dL — ABNORMAL HIGH (ref 0.44–1.00)
GFR, Estimated: 26 mL/min — ABNORMAL LOW (ref >60)
Glucose, Bld: 95 mg/dL (ref 70–99)
Potassium: 3.6 mmol/L (ref 3.5–5.1)
Sodium: 137 mmol/L (ref 135–145)
Total Bilirubin: 0.5 mg/dL (ref 0.3–1.2)
Total Protein: 6.4 g/dL — ABNORMAL LOW (ref 6.5–8.1)

## 2023-04-20 LAB — CBC
HCT: 32.8 % — ABNORMAL LOW (ref 36.0–46.0)
Hemoglobin: 10.3 g/dL — ABNORMAL LOW (ref 12.0–15.0)
MCH: 29.7 pg (ref 26.0–34.0)
MCHC: 31.4 g/dL (ref 30.0–36.0)
MCV: 94.5 fL (ref 80.0–100.0)
Platelets: 252 10*3/uL (ref 150–400)
RBC: 3.47 MIL/uL — ABNORMAL LOW (ref 3.87–5.11)
RDW: 17.1 % — ABNORMAL HIGH (ref 11.5–15.5)
WBC: 8.8 10*3/uL (ref 4.0–10.5)
nRBC: 0.2 % (ref 0.0–0.2)

## 2023-04-20 LAB — HEPARIN LEVEL (UNFRACTIONATED): Heparin Unfractionated: 0.62 IU/mL (ref 0.30–0.70)

## 2023-04-20 LAB — MAGNESIUM: Magnesium: 2.1 mg/dL (ref 1.7–2.4)

## 2023-04-20 MED ORDER — FUROSEMIDE 10 MG/ML IJ SOLN
INTRAMUSCULAR | Status: AC
  Filled 2023-04-20: qty 4

## 2023-04-20 NOTE — Evaluation (Signed)
Physical Therapy Evaluation Patient Details Name: Nicole [name] MRN: [id number] DOB: 1946/06/02 Today's Date: 04/20/2023  History of Present Illness  77 year old female with a history of hypertension, polymyalgia rheumatica on chronic prednisone, rheumatoid arthritis on chronic prednisone, esophageal dysmotility, hyperlipidemia, secondary hyperparathyroidism, temporal arteritis and chronic lower extremity edema presenting with worsening shortness of breath and dyspnea on exertion over the past several weeks.  The patient is a difficult historian.  History is supplemented from speaking with the patient's son and reviewed the medical record.  Nevertheless, it appears that the patient has been having shortness of breath and dyspnea on exertion at least for the past year, but it has worsened in the past month.  The patient has chronic lower extremity edema which has been well-documented throughout the medical record, particularly their evaluations from cardiology [facility]) and vascular surgery ([facility]).  Nevertheless, the patient feels that that her edema has been worsened over the past several weeks.  She states that she has been having some chest pressure on and off at rest as well as with movement.  Most of the time, she takes some antacids, and she has relief for several hours before it returns.  In addition she states that belching also seems to help her chest discomfort.  She denies any nausea, vomiting, diarrhea, abdominal pain, fevers, chills.  There is no hematochezia or melena.   Clinical Impression  Patient supine in bed upon therapist arrival. Patient agreeable to participating in evaluation today. Patient requires min to mod assist for bed mobility. Patient transferred sit to stand x2 using RW and min assist demonstrating signifcant hip flexion using RW for support. Patient ambulated using a slow, labored cadence using RW, limited to a few sidestep to the right to the head of the bed. Patient limited  by pain and fatigue and exhibited dyspnea on exertion with all activity requiring rest breaks throughout session.      Recommendations for follow up therapy are one component of a multi-disciplinary discharge planning process, led by the attending physician.  Recommendations may be updated based on patient status, additional functional criteria and insurance authorization.  Follow Up Recommendations Can patient physically be transported by private vehicle: No     Assistance Recommended at Discharge Intermittent Supervision/Assistance  Patient can return home with the following  A lot of help with bathing/dressing/bathroom;Assistance with cooking/housework;Assist for transportation;Help with stairs or ramp for entrance;Two people to help with walking and/or transfers    Equipment Recommendations None recommended by PT  Recommendations for Other Services       Functional Status Assessment Patient has had a recent decline in their functional status and demonstrates the ability to make significant improvements in function in a reasonable and predictable amount of time.     Precautions / Restrictions Precautions Precautions: Fall Restrictions Weight Bearing Restrictions: No      Mobility  Bed Mobility Overal bed mobility: Needs Assistance Bed Mobility: Supine to Sit, Sit to Supine     Supine to sit: HOB elevated, Mod assist, Min assist Sit to supine: Mod assist, Min assist   General bed mobility comments: supine to sit assist for trunk; sit to supine assist for lower extremities; dyspnea on exertion    Transfers Overall transfer level: Needs assistance Equipment used: Rolling walker (2 wheels) Transfers: Sit to/from Stand Sit to Stand: Min assist           General transfer comment: once standing, signifcant hip flexion using RW for support; dyspnea on exertion; limited by pain  and fatigue    Ambulation/Gait   Gait Distance (Feet): 2 Feet Assistive device: Rolling  walker (2 wheels) Gait Pattern/deviations: Step-to pattern, Decreased step length - right, Decreased step length - left, Decreased stride length, Trunk flexed Gait velocity: decreased     General Gait Details: slow, labored cadence using RW, limited to a few sidestep to the right to the head of the bed; limited by pain and fatigue  Stairs            Wheelchair Mobility    Modified Rankin (Stroke Patients Only)       Balance Overall balance assessment: Needs assistance Sitting-balance support: Bilateral upper extremity supported, Feet supported Sitting balance-Leahy Scale: Good     Standing balance support: Bilateral upper extremity supported, During functional activity, Reliant on assistive device for balance Standing balance-Leahy Scale: Poor Standing balance comment: fair/poor with RW         Pertinent Vitals/Pain Pain Assessment Pain Assessment: Faces Faces Pain Scale: Hurts whole lot Pain Descriptors / Indicators: Numbness, Crying, Heaviness, Grimacing, Sore, Moaning Pain Intervention(s): Limited activity within patient's tolerance, Monitored during session, Repositioned    Home Living Family/patient expects to be discharged to:: Private residence Living Arrangements: Alone Available Help at Discharge: Available PRN/intermittently;Personal care attendant;Home health Type of Home: House Home Access: Ramped entrance       Home Layout: One level Home Equipment: Wheelchair - manual;Cane - single point;Rollator (4 wheels);Hospital bed      Prior Function Prior Level of Function : Needs assist       Physical Assist : Mobility (physical);ADLs (physical) Mobility (physical): Transfers;Gait ADLs (physical): IADLs;Dressing;Bathing Mobility Comments: lately has primarily been using manual wheelchair with transfers from bed to mwc or Rollator seat       Hand Dominance        Extremity/Trunk Assessment   Upper Extremity Assessment Upper Extremity  Assessment: Generalized weakness    Lower Extremity Assessment Lower Extremity Assessment: Generalized weakness    Cervical / Trunk Assessment Cervical / Trunk Assessment: Normal  Communication   Communication: No difficulties  Cognition Arousal/Alertness: Awake/alert Behavior During Therapy: WFL for tasks assessed/performed Overall Cognitive Status: Within Functional Limits for tasks assessed        General Comments      Exercises     Assessment/Plan    PT Assessment Patient needs continued PT services  PT Problem List Decreased strength;Decreased balance;Pain;Decreased mobility;Decreased activity tolerance       PT Treatment Interventions DME instruction;Functional mobility training;Balance training;Patient/family education;Gait training;Therapeutic activities;Therapeutic exercise    PT Goals (Current goals can be found in the Care Plan section)  Acute Rehab PT Goals Patient Stated Goal: Go home with assistance from family, home health and personal care assistant. PT Goal Formulation: With patient Time For Goal Achievement: 05/04/23 Potential to Achieve Goals: Fair    Frequency Min 3X/week        AM-PAC PT "6 Clicks" Mobility  Outcome Measure Help needed turning from your back to your side while in a flat bed without using bedrails?: A Little Help needed moving from lying on your back to sitting on the side of a flat bed without using bedrails?: A Lot Help needed moving to and from a bed to a chair (including a wheelchair)?: A Lot Help needed standing up from a chair using your arms (e.g., wheelchair or bedside chair)?: A Little Help needed to walk in hospital room?: A Lot Help needed climbing 3-5 steps with a railing? : Total 6 Click Score: 13  End of Session   Activity Tolerance: Patient limited by fatigue;Patient limited by pain Patient left: in bed;with bed alarm set;with call bell/phone within reach Nurse Communication: Mobility status PT Visit  Diagnosis: Unsteadiness on feet (R26.81);Muscle weakness (generalized) (M62.81);Other abnormalities of gait and mobility (R26.89)    Time: 1610-9604 PT Time Calculation (min) (ACUTE ONLY): 29 min   Charges:   PT Evaluation $PT Eval Low Complexity: 1 Low PT Treatments $Therapeutic Activity: 8-22 mins        Katina Dung. Hartnett-Rands, MS, PT [place] [facility] [place] [phone]  Britta Mccreedy  Hartnett-Rands 04/20/2023, 2:05 PM

## 2023-04-20 NOTE — Progress Notes (Signed)
ANTICOAGULATION CONSULT NOTE - Follow Up Consult  Pharmacy Consult for heparin Indication: pulmonary embolus  Allergies  Allergen Reactions   Oxycontin [Oxycodone Hcl] Swelling    Pt tolerates hydromorphone.   Sulfa Antibiotics Other (See Comments)    Sores   Tramadol     "Makes me feel like I am falling"   Penicillins Rash    Tolerated augmentin    Sulfasalazine Other (See Comments)    Sores    Patient Measurements: Height: 5\' 1"  (154.9 cm) Weight: 117.3 kg (258 lb 9.6 oz) IBW/kg (Calculated) : 47.8 Heparin Dosing Weight: 76.4  Vital Signs: Temp: 98.2 F (36.8 C) (06/28 0519) Temp Source: Oral (06/28 0519) BP: 120/56 (06/28 0519) Pulse Rate: 90 (06/28 0519)  Labs: Recent Labs    04/18/23 1235 04/18/23 1235 04/18/23 1505 04/19/23 0303 04/19/23 1224 04/19/23 2140 04/20/23 0410  HGB 10.3*  --   --  10.1*  --   --  10.3*  HCT 33.0*  --   --  31.8*  --   --  32.8*  PLT 260  --   --  223  --   --  252  HEPARINUNFRC  --    < >  --  0.86* 0.70 0.60 0.62  CREATININE 2.24*  --   --  2.02*  --   --  1.94*  TROPONINIHS 10  --  9  --   --   --   --    < > = values in this interval not displayed.     Estimated Creatinine Clearance: 29.4 mL/min (A) (by C-G formula based on SCr of 1.94 mg/dL (H)).   Medical History: Past Medical History:  Diagnosis Date   AC (acromioclavicular) joint bone spurs    lt shoulder   Acute kidney failure (HCC)    Anemia    Arthritis    Carpal tunnel syndrome, bilateral    CHF (congestive heart failure) (HCC)    Chronic kidney disease    Chronic pain syndrome    Cognitive communication deficit    Collagen vascular disease (HCC)    Dysphagia    Gastroesophageal reflux    Headache    recent visit to ER @ [facility] for severe headache   Hypertension    Lumbar stenosis    Hx of ESIs by Dr. Ethelene Hal   Major depressive disorder    Metabolic encephalopathy    Muscle weakness    Polyarthralgia    Polymyalgia (HCC)    Shingles     Spinal stenosis     Medications:  Medications Prior to Admission  Medication Sig Dispense Refill Last Dose   acetaminophen (TYLENOL) 325 MG tablet Take 2 tablets (650 mg total) by mouth every 6 (six) hours as needed for mild pain or headache (or Fever >/= 101).   UNK   allopurinol (ZYLOPRIM) 100 MG tablet Take 100 mg by mouth daily.   04/18/2023   atorvastatin (LIPITOR) 20 MG tablet Take 20 mg by mouth every morning.   04/18/2023   citalopram (CELEXA) 20 MG tablet Take 20 mg by mouth daily.   04/18/2023   gabapentin (NEURONTIN) 300 MG capsule Take 300 mg by mouth 3 (three) times daily.   04/18/2023   meloxicam (MOBIC) 7.5 MG tablet Take 7.5 mg by mouth every morning.   04/18/2023   metoprolol tartrate (LOPRESSOR) 25 MG tablet Take 25 mg by mouth 2 (two) times daily.   04/18/2023 at AM   montelukast (SINGULAIR) 10 MG tablet Take 10  mg by mouth at bedtime.   04/18/2023   predniSONE (DELTASONE) 10 MG tablet Take 10 mg by mouth daily with breakfast.   04/18/2023   spironolactone (ALDACTONE) 25 MG tablet Take 25 mg by mouth daily.   04/18/2023   tamsulosin (FLOMAX) 0.4 MG CAPS capsule Take 0.4 mg by mouth daily after breakfast.   04/18/2023   Torsemide 40 MG TABS Take 40 mg by mouth 2 (two) times daily. Dose to be further adjusted and medication resume when renal function fully recovered and patient able to tolerate PO's.   04/18/2023   traZODone (DESYREL) 50 MG tablet Take 25 mg by mouth at bedtime.   04/17/2023   trolamine salicylate (ASPERCREME) 10 % cream Apply 1 application  topically as needed for muscle pain.   UNK   calcitRIOL (ROCALTROL) 0.25 MCG capsule Take 0.25 mcg by mouth See admin instructions. Take 0.25 every Mon, Wed, Fri. (Patient not taking: Reported on 08/02/2022)   Not Taking   colchicine 0.6 MG tablet Take 1 tablet (0.6 mg total) by mouth daily. Take 1.2mg  (2 tablets) then 0.6mg  (1 tablet) 1 hour after. Then, take 1 tablet every day for 7 days. (Patient not taking: Reported on 04/19/2023) 10  tablet 2 Completed Course   predniSONE (DELTASONE) 20 MG tablet Take 1 tablet (20 mg total) by mouth daily with breakfast for 5 days. (Patient not taking: Reported on 04/19/2023) 5 tablet 0 Completed Course    Assessment: 77 yo female with history of morbid obesity and HTN presents after several days of chest pain. Patient with previously high troponin per note. Patient instructed to present to the ED for evaluation for persistent chest pain. D-dimer elevated as well as elevated SCR of 2.24. Dopplers for DVT were negative, but patient with VQ scan positive for a RLL perfusion defect compatible with pulmonary embolism. Hgb 10.3, PLT 260. No PTA anticoagulation noted in med list or ED notes.   HL 0.62- therapeutic    Goal of Therapy:  Heparin level 0.3-0.7 units/ml Monitor platelets by anticoagulation protocol: Yes   Plan:  Continue heparin drip at 1150 units/hr Heparin level and H&H daily Monitor for bleeding  Judeth Cornfield, PharmD Clinical Pharmacist 04/20/2023 7:42 AM

## 2023-04-20 NOTE — TOC Progression Note (Signed)
Transition of Care [facility]) - Progression Note    Patient Details  Name: Nicole [name] MRN: [id number] Date of Birth: 1946-01-12  [facility]) CM/SW Contact  Annice Needy, LCSW Phone Number: 04/20/2023, 2:14 PM  Clinical Narrative:    Patient declines SNF. States that [facility] is coming to her home. Indicates that when the swelling goes down in her legs she can do a lot more. States that at baseline she can transfer independently and get to the bathroom. States that she is up daily and does not stay in bed all day.    Expected Discharge Plan: Home w Home Health Services Barriers to Discharge: Continued Medical Work up  Expected Discharge Plan and Services In-house Referral: Clinical Social Work   Post Acute Care Choice: Resumption of Svcs/PTA Provider Living arrangements for the past 2 months: Single Family Home                                       Social Determinants of Health (SDOH) Interventions SDOH Screenings   Food Insecurity: No Food Insecurity (04/19/2023)  Housing: Low Risk  (04/19/2023)  Transportation Needs: No Transportation Needs (04/19/2023)  Utilities: Not At Risk (04/19/2023)  Depression (PHQ2-9): Low Risk  (04/15/2020)  Tobacco Use: Low Risk  (04/18/2023)    Readmission Risk Interventions    12/23/2021   11:10 AM 10/04/2021    8:38 AM 09/26/2021   11:14 AM  Readmission Risk Prevention Plan  Transportation Screening Complete Complete Complete  HRI or Home Care Consult  Complete Complete  Social Work Consult for Recovery Care Planning/Counseling  Complete Complete  Palliative Care Screening  Not Applicable Not Applicable  Medication Review Oceanographer) Complete Complete Complete  HRI or Home Care Consult Complete    SW Recovery Care/Counseling Consult Complete    Palliative Care Screening Not Applicable    Skilled Nursing Facility Not Applicable

## 2023-04-20 NOTE — Plan of Care (Signed)
  Problem: Acute Rehab PT Goals(only PT should resolve) Goal: Pt will Roll Supine to Side Outcome: Progressing Flowsheets (Taken 04/20/2023 1409) Pt will Roll Supine to Side: with min assist Goal: Pt Will Go Supine/Side To Sit Outcome: Progressing Flowsheets (Taken 04/20/2023 1409) Pt will go Supine/Side to Sit:  with minimal assist  with HOB elevated Goal: Pt Will Go Sit To Supine/Side Outcome: Progressing Flowsheets (Taken 04/20/2023 1409) Pt will go Sit to Supine/Side:  with minimal assist  with HOB  elevated Goal: Patient Will Transfer Sit To/From Stand Outcome: Progressing Flowsheets (Taken 04/20/2023 1409) Patient will transfer sit to/from stand: with minimal assist Goal: Pt Will Transfer Bed To Chair/Chair To Bed Outcome: Progressing Flowsheets (Taken 04/20/2023 1409) Pt will Transfer Bed to Chair/Chair to Bed: with mod assist   Britta Mccreedy D. Hartnett-Rands, MS, PT [facility] PT [facility] [phone] 04/20/2023

## 2023-04-20 NOTE — Progress Notes (Signed)
PROGRESS NOTE  Nicole [name] ZOX:[id number] DOB: 1946-05-03 DOA: 04/18/2023 PCP: [name], PA-C  Brief History:  77 year old female with a history of hypertension, polymyalgia rheumatica on chronic prednisone, rheumatoid arthritis on chronic prednisone, esophageal dysmotility, hyperlipidemia, secondary hyperparathyroidism, temporal arteritis and chronic lower extremity edema presenting with worsening shortness of breath and dyspnea on exertion over the past several weeks. The patient is a difficult historian.  History is supplemented from speaking with the patient's son and reviewed the medical record.  Nevertheless, it appears that the patient has been having shortness of breath and dyspnea on exertion at least for the past year, but it has worsened in the past month.  The patient has chronic lower extremity edema which has been well-documented throughout the medical record, particularly their evaluations from cardiology [facility] Emms) and vascular surgery ([facility]).  Nevertheless, the patient feels that that her edema has been worsened over the past several weeks.  She states that she has been having some chest pressure on and off at rest as well as with movement.  Most of the time, she takes some antacids, and she has relief for several hours before it returns.  In addition she states that belching also seems to help her chest discomfort.  She denies any nausea, vomiting, diarrhea, abdominal pain, fevers, chills.  There is no hematochezia or melena.  Interestingly, she states that she saw her PCP about 3 weeks prior to this admission because of her edema and shortness of breath.  She states that her PCP increased her diuretic to 3 times daily from once daily.  However review of the medical record shows that she had been on torsemide 40 mg twice daily in the past.  In the ED, the patient was afebrile and hemodynamically stable with oxygen saturation 96% room air.  WBC 9.5, hemoglobin 10.3,  platelets 250,000.Sodium 135, potassium 5.2, bicarbonate 26, serum creatinine 2.24.  The patient was started on IV furosemide and given Lokelma.  VQ scan showed a single wedge-shaped perfusion defect in the lateral aspect of the superior right lower lobe consistent with pulmonary embolus.  The patient was started on IV heparin.   Assessment/Plan: Acute pulmonary embolus -Stable on room air presently -04/18/2023 VQ scan--single which showed perfusion defect in the lateral aspect of superior RLL c/w PE -Continue IV heparin -6/27 Echo EF 60-65%, no wMA, G1DD, normal RVF   Fluid overload -Suspect this is due to progression of her underlying CKD as well as proteinuria and lymphedema -Question diagnosis of  CHF with BNP of 28.0 -6/27 Echo EF 60-65%, no wMA, G1DD, normal RVF -Urine protein creatinine ratio -11/30/2022 office weight 242 pounds -Question accuracy of current weights given patient's inability to stand -continue lasix IV 40 mg bid   CKD stage IV -Baseline creatinine 2.0-2.2 -Monitor with diuretics   Chest discomfort -Mostly atypical by history -Primarily due to GI and musculoskeletal etiology -Certainly, her PE may have contributed -Reproducible on palpation -troponin 10>>9 -6/27 Echo EF 60-65%, no wMA, G1DD, normal RVF   Morbid obesity -BMI 47.57 -Lifestyle modification   Physical deconditioning -At baseline, the patient is able to bear weight and make a transfer with assistance   Opioid dependence/chronic pain syndrome -PDMP reviewed -Norco 5/325, #120, last refill 04/13/2023 -Continue gabapentin   Lower extremity edema -Multifactorial including chronic prednisone, progression of CKD, lymphedema -Venous duplex negative for DVT -07/20/2021 echo EF 65 to 70%, no WMA, normal RV function   Polymyalgia rheumatica/rheumatoid  arthritis -Patient is on chronic prednisone 10 mg daily -Previously on [place], but stopped secondary to her frequent hospitalizations and  infections in the past -Not a candidate for further Biologics per last visit with rheumatology   Mixed hyperlipidemia -Continue statin                     Family Communication:   son updated 6/27   Consultants:  none   Code Status:  FULL    DVT Prophylaxis:  IV Heparin      Procedures: As Listed in Progress Note Above   Antibiotics: None            Subjective: Pt is breathing better.  Cp is improving.  Denies f/c, n/v/d, abd pain  Objective: Vitals:   04/19/23 1349 04/19/23 1955 04/20/23 0519 04/20/23 1301  BP: (!) 107/55 131/61 (!) 120/56 125/73  Pulse: 88 83 90 91  Resp: 20 20 20 18   Temp: 98.2 F (36.8 C) 98.7 F (37.1 C) 98.2 F (36.8 C) 99 F (37.2 C)  TempSrc: Oral Axillary Oral Oral  SpO2: 94% 97% 93% 94%  Weight:   117.3 kg   Height:        Intake/Output Summary (Last 24 hours) at 04/20/2023 1748 Last data filed at 04/20/2023 1302 Gross per 24 hour  Intake 702.27 ml  Output 2100 ml  Net -1397.73 ml   Weight change: 2.041 kg Exam:  General:  Pt is alert, follows commands appropriately, not in acute distress HEENT: No icterus, No thrush, No neck mass, [place]/AT Cardiovascular: RRR, S1/S2, no rubs, no gallops Respiratory: bibasilar crackles.  No wheeze Abdomen: Soft/+BS, non tender, non distended, no guarding Extremities: 1 +LE edema, No lymphangitis, No petechiae, No rashes, no synovitis   Data Reviewed: I have personally reviewed following labs and imaging studies Basic Metabolic Panel: Recent Labs  Lab 04/18/23 1235 04/19/23 0303 04/20/23 0410  NA 135 138 137  K 5.2* 3.7 3.6  CL 97* 101 98  CO2 26 27 28   GLUCOSE 110* 88 95  BUN 62* 50* 45*  CREATININE 2.24* 2.02* 1.94*  CALCIUM 9.0 8.9 8.9  MG  --   --  2.1   Liver Function Tests: Recent Labs  Lab 04/20/23 0410  AST 20  ALT 22  ALKPHOS 58  BILITOT 0.5  PROT 6.4*  ALBUMIN 3.0*   No results for input(s): "LIPASE", "AMYLASE" in the last 168 hours. No results for  input(s): "AMMONIA" in the last 168 hours. Coagulation Profile: No results for input(s): "INR", "PROTIME" in the last 168 hours. CBC: Recent Labs  Lab 04/18/23 1235 04/19/23 0303 04/20/23 0410  WBC 9.5 8.1 8.8  HGB 10.3* 10.1* 10.3*  HCT 33.0* 31.8* 32.8*  MCV 94.3 93.0 94.5  PLT 260 223 252   Cardiac Enzymes: No results for input(s): "CKTOTAL", "CKMB", "CKMBINDEX", "TROPONINI" in the last 168 hours. BNP: Invalid input(s): "POCBNP" CBG: No results for input(s): "GLUCAP" in the last 168 hours. HbA1C: No results for input(s): "HGBA1C" in the last 72 hours. Urine analysis:    Component Value Date/Time   COLORURINE YELLOW 01/18/2022 1425   APPEARANCEUR CLEAR 01/18/2022 1425   LABSPEC <1.005 (L) 01/18/2022 1425   PHURINE 6.0 01/18/2022 1425   GLUCOSEU NEGATIVE 01/18/2022 1425   HGBUR TRACE (A) 01/18/2022 1425   BILIRUBINUR NEGATIVE 01/18/2022 1425   BILIRUBINUR negative 02/14/2020 1328   KETONESUR NEGATIVE 01/18/2022 1425   PROTEINUR NEGATIVE 01/18/2022 1425   UROBILINOGEN 0.2 02/14/2020 1328  UROBILINOGEN 0.2 02/20/2014 1116   NITRITE NEGATIVE 01/18/2022 1425   LEUKOCYTESUR LARGE (A) 01/18/2022 1425   Sepsis Labs: @LABRCNTIP (procalcitonin:4,lacticidven:4) )No results found for this or any previous visit (from the past 240 hour(s)).   Scheduled Meds:  atorvastatin  20 mg Oral q morning   furosemide  40 mg Intravenous BID   gabapentin  300 mg Oral TID   predniSONE  10 mg Oral Q breakfast   Continuous Infusions:  heparin 1,150 Units/hr (04/20/23 1004)    Procedures/Studies: ECHOCARDIOGRAM COMPLETE  Result Date: 04/19/2023    ECHOCARDIOGRAM REPORT   Patient Name:   GEORGENIA DUNAWAY Date of Exam: 04/19/2023 Medical Rec #:  [id number]      Height:       61.0 in Accession #:    [id number]     Weight:       251.8 lb Date of Birth:  02-09-1946       BSA:          2.083 m Patient Age:    76 years       BP:           111/59 mmHg Patient Gender: F              HR:           93  bpm. Exam Location:  [facility] Procedure: 2D Echo, Cardiac Doppler and Color Doppler Indications:    Congestive Heart Failure I50.9  History:        Patient has prior history of Echocardiogram examinations, most                 recent 07/20/2021. CHF; Risk Factors:Hypertension and                 Dyslipidemia.  Sonographer:    Celesta Gentile RCS Referring Phys: [phone] [facility]  Sonographer Comments: Technically difficult study due to poor echo windows and patient is obese. IMPRESSIONS  1. Left ventricular ejection fraction, by estimation, is 60 to 65%. The left ventricle has normal function. The left ventricle has no regional wall motion abnormalities. There is mild left ventricular hypertrophy. Left ventricular diastolic parameters are consistent with Grade I diastolic dysfunction (impaired relaxation).  2. Right ventricular systolic function is normal. The right ventricular size is mildly enlarged. Tricuspid regurgitation signal is inadequate for assessing PA pressure.  3. The mitral valve was not well visualized. No evidence of mitral valve regurgitation. No evidence of mitral stenosis.  4. The aortic valve was not well visualized. Aortic valve regurgitation is not visualized. No aortic stenosis is present.  5. The inferior vena cava is dilated in size with >50% respiratory variability, suggesting right atrial pressure of 8 mmHg. FINDINGS  Left Ventricle: Left ventricular ejection fraction, by estimation, is 60 to 65%. The left ventricle has normal function. The left ventricle has no regional wall motion abnormalities. Definity contrast agent was given IV to delineate the left ventricular  endocardial borders. The left ventricular internal cavity size was normal in size. There is mild left ventricular hypertrophy. Left ventricular diastolic parameters are consistent with Grade I diastolic dysfunction (impaired relaxation). Right Ventricle: The right ventricular size is mildly enlarged. No increase in  right ventricular wall thickness. Right ventricular systolic function is normal. Tricuspid regurgitation signal is inadequate for assessing PA pressure. Left Atrium: Left atrial size was normal in size. Right Atrium: Right atrial size was normal in size. Pericardium: Trivial pericardial effusion is present. Mitral Valve: The mitral valve was  not well visualized. No evidence of mitral valve regurgitation. No evidence of mitral valve stenosis. Tricuspid Valve: The tricuspid valve is normal in structure. Tricuspid valve regurgitation is not demonstrated. No evidence of tricuspid stenosis. Aortic Valve: The aortic valve was not well visualized. Aortic valve regurgitation is not visualized. No aortic stenosis is present. Aortic valve mean gradient measures 8.0 mmHg. Aortic valve peak gradient measures 13.2 mmHg. Aortic valve area, by VTI measures 1.32 cm. Pulmonic Valve: The pulmonic valve was not well visualized. Pulmonic valve regurgitation is trivial. No evidence of pulmonic stenosis. Aorta: The aortic root is normal in size and structure. Venous: The inferior vena cava is dilated in size with greater than 50% respiratory variability, suggesting right atrial pressure of 8 mmHg. IAS/Shunts: The interatrial septum was not well visualized.  LEFT VENTRICLE PLAX 2D LVIDd:         3.70 cm   Diastology LVIDs:         1.90 cm   LV e' lateral:   8.05 cm/s LV PW:         1.40 cm   LV E/e' lateral: 9.8 LV IVS:        1.30 cm LVOT diam:     1.70 cm LV SV:         53 LV SV Index:   26 LVOT Area:     2.27 cm  LEFT ATRIUM             Index LA diam:        3.30 cm 1.58 cm/m LA Vol (A2C):   29.8 ml 14.31 ml/m LA Vol (A4C):   29.1 ml 13.97 ml/m LA Biplane Vol: 29.6 ml 14.21 ml/m  AORTIC VALVE AV Area (Vmax):    1.30 cm AV Area (Vmean):   1.37 cm AV Area (VTI):     1.32 cm AV Vmax:           182.00 cm/s AV Vmean:          135.000 cm/s AV VTI:            0.401 m AV Peak Grad:      13.2 mmHg AV Mean Grad:      8.0 mmHg LVOT Vmax:          104.00 cm/s LVOT Vmean:        81.600 cm/s LVOT VTI:          0.234 m LVOT/AV VTI ratio: 0.58  AORTA Ao Root diam: 3.70 cm MITRAL VALVE MV Area (PHT): 2.95 cm     SHUNTS MV Decel Time: 257 msec     Systemic VTI:  0.23 m MV E velocity: 79.00 cm/s   Systemic Diam: 1.70 cm MV A velocity: 136.00 cm/s MV E/A ratio:  0.58 Vishnu Priya Mallipeddi Electronically signed by Winfield Rast Mallipeddi Signature Date/Time: 04/19/2023/12:18:16 PM    Final    NM Pulmonary Perfusion  Result Date: 04/18/2023 CLINICAL DATA:  Pulmonary embolism (PE) suspected, low to intermediate prob, positive D-dimer EXAM: NUCLEAR MEDICINE PERFUSION LUNG SCAN TECHNIQUE: Perfusion images were obtained in multiple projections after intravenous injection of radiopharmaceutical. Ventilation scans intentionally deferred if perfusion scan and chest x-ray adequate for interpretation during COVID 19 epidemic. RADIOPHARMACEUTICALS:  4.4 mCi Tc-97m MAA IV COMPARISON:  04/18/2023 FINDINGS: Planar images of the lungs are obtained in multiple projections during the perfusion phase of the exam. There is a single wedge-shaped subsegmental perfusion defect within the lateral aspect of the superior segment right lower lobe. No other  perfusion defects. IMPRESSION: 1. Single wedge-shaped perfusion defect within the lateral aspect superior segment right lower lobe, compatible with pulmonary embolus based on PISAPED criteria. Critical Value/emergent results were called by telephone at the time of interpretation on 04/18/2023 at 5:27pm to provider Dr. Charm Barges, who verbally acknowledged these results. Electronically Signed   By: Sharlet Salina M.D.   On: 04/18/2023 17:42   US Venous Img Lower Bilateral (DVT)  Result Date: 04/18/2023 CLINICAL DATA:  Bilateral edema, worsening right.  Pain EXAM: BILATERAL LOWER EXTREMITY VENOUS DOPPLER ULTRASOUND TECHNIQUE: Gray-scale sonography with compression, as well as color and duplex ultrasound, were performed to evaluate  the deep venous system(s) from the level of the common femoral vein through the popliteal and proximal calf veins. COMPARISON:  08/29/2007 FINDINGS: VENOUS Normal compressibility of the common femoral, superficial femoral, and popliteal veins, as well as the visualized calf veins. Visualized portions of profunda femoral vein and great saphenous vein unremarkable. No filling defects to suggest DVT on grayscale or color Doppler imaging. Doppler waveforms show normal direction of venous flow, normal respiratory plasticity and response to augmentation. OTHER Marked subcutaneous edema in the distal left calf. Limitations: Technologist describes technically difficult study secondary to body habitus, pitting edema, and patient pain intolerance limiting calf vein evaluation. IMPRESSION: No femoropopliteal DVT . If clinical symptoms are inconsistent or if there are persistent or worsening symptoms, further imaging (possibly involving the iliac veins) may be warranted. Electronically Signed   By: Corlis Leak M.D.   On: 04/18/2023 15:09   DG Chest 2 View  Result Date: 04/18/2023 CLINICAL DATA:  Chest pain EXAM: CHEST - 2 VIEW COMPARISON:  03/27/2019 FINDINGS: Underinflation. Bronchovascular crowding. Borderline cardiopericardial silhouette with a calcified aorta. Left midlung scar or atelectasis is once again identified. No pneumothorax or effusion. Overlapping cardiac leads. Degenerative changes of the shoulders and spine. Under penetrated radiograph IMPRESSION: Underinflation with bronchovascular crowding. Stable left midlung scar or atelectasis. Electronically Signed   By: Karen Kays M.D.   On: 04/18/2023 13:06   DG Chest 2 View  Result Date: 04/01/2023 CLINICAL DATA:  Chest pain. EXAM: CHEST - 2 VIEW COMPARISON:  Chest radiograph 12/25/2021 FINDINGS: Cardiomegaly. Aortic tortuosity. Bandlike opacity left mid lung. Low lung volumes. No pleural effusion or pneumothorax. Extensive bilateral shoulder joint degenerative  changes. IMPRESSION: Bandlike opacity left mid lung favored to represent atelectasis. Electronically Signed   By: Annia Belt M.D.   On: 04/01/2023 21:25    Catarina Hartshorn, DO  [facility]  If 7PM-7AM, please contact night-coverage [email or web address] Password [facility] 04/20/2023, 5:48 PM   LOS: 1 day

## 2023-04-20 NOTE — Progress Notes (Signed)
Patient slept on and off this shift. Melatonin given once this shift. Patient had complaint of pain to legs that she described as pin and needles, Vicodin given once this shift. Patient had one bowel movement. Continued to monitor patient.

## 2023-04-21 ENCOUNTER — Inpatient Hospital Stay (HOSPITAL_COMMUNITY): Payer: Medicare Other

## 2023-04-21 DIAGNOSIS — F1120 Opioid dependence, uncomplicated: Secondary | ICD-10-CM | POA: Diagnosis not present

## 2023-04-21 DIAGNOSIS — N184 Chronic kidney disease, stage 4 (severe): Secondary | ICD-10-CM | POA: Diagnosis not present

## 2023-04-21 DIAGNOSIS — I2699 Other pulmonary embolism without acute cor pulmonale: Secondary | ICD-10-CM | POA: Diagnosis not present

## 2023-04-21 LAB — URINALYSIS, W/ REFLEX TO CULTURE (INFECTION SUSPECTED)
Bacteria, UA: NONE SEEN
Bilirubin Urine: NEGATIVE
Glucose, UA: NEGATIVE mg/dL
Hgb urine dipstick: NEGATIVE
Ketones, ur: NEGATIVE mg/dL
Leukocytes,Ua: NEGATIVE
Nitrite: NEGATIVE
Protein, ur: NEGATIVE mg/dL
Specific Gravity, Urine: 1.005 (ref 1.005–1.030)
pH: 7 (ref 5.0–8.0)

## 2023-04-21 LAB — BASIC METABOLIC PANEL
Anion gap: 12 (ref 5–15)
BUN: 36 mg/dL — ABNORMAL HIGH (ref 8–23)
CO2: 28 mmol/L (ref 22–32)
Calcium: 9.3 mg/dL (ref 8.9–10.3)
Chloride: 96 mmol/L — ABNORMAL LOW (ref 98–111)
Creatinine, Ser: 1.84 mg/dL — ABNORMAL HIGH (ref 0.44–1.00)
GFR, Estimated: 28 mL/min — ABNORMAL LOW (ref >60)
Glucose, Bld: 90 mg/dL (ref 70–99)
Potassium: 3.5 mmol/L (ref 3.5–5.1)
Sodium: 136 mmol/L (ref 135–145)

## 2023-04-21 LAB — CBC
HCT: 33.8 % — ABNORMAL LOW (ref 36.0–46.0)
Hemoglobin: 10.7 g/dL — ABNORMAL LOW (ref 12.0–15.0)
MCH: 29.7 pg (ref 26.0–34.0)
MCHC: 31.7 g/dL (ref 30.0–36.0)
MCV: 93.9 fL (ref 80.0–100.0)
Platelets: 262 10*3/uL (ref 150–400)
RBC: 3.6 MIL/uL — ABNORMAL LOW (ref 3.87–5.11)
RDW: 17 % — ABNORMAL HIGH (ref 11.5–15.5)
WBC: 9.5 10*3/uL (ref 4.0–10.5)
nRBC: 0.2 % (ref 0.0–0.2)

## 2023-04-21 LAB — HEPARIN LEVEL (UNFRACTIONATED): Heparin Unfractionated: 0.54 IU/mL (ref 0.30–0.70)

## 2023-04-21 LAB — HEMOGLOBIN AND HEMATOCRIT, BLOOD
HCT: 32.5 % — ABNORMAL LOW (ref 36.0–46.0)
Hemoglobin: 10.4 g/dL — ABNORMAL LOW (ref 12.0–15.0)

## 2023-04-21 LAB — PROTEIN / CREATININE RATIO, URINE
Creatinine, Urine: 30 mg/dL
Total Protein, Urine: <6 mg/dL

## 2023-04-21 LAB — MAGNESIUM: Magnesium: 1.9 mg/dL (ref 1.7–2.4)

## 2023-04-21 MED ORDER — IOHEXOL 9 MG/ML PO SOLN
500.0000 mL | ORAL | Status: AC
  Administered 2023-04-21 (×2): 500 mL via ORAL

## 2023-04-21 MED ORDER — ALUM & MAG HYDROXIDE-SIMETH 200-200-20 MG/5ML PO SUSP
15.0000 mL | ORAL | Status: DC | PRN
  Administered 2023-04-21 – 2023-04-29 (×3): 15 mL via ORAL
  Filled 2023-04-21 (×3): qty 30

## 2023-04-21 MED ORDER — EMPTY CONTAINERS FLEXIBLE MISC: 5000.0000 [IU] | Status: DC

## 2023-04-21 MED ORDER — CHLORHEXIDINE GLUCONATE CLOTH 2 % EX PADS
6.0000 | MEDICATED_PAD | Freq: Every day | CUTANEOUS | Status: DC
  Administered 2023-04-21 – 2023-05-01 (×9): 6 via TOPICAL

## 2023-04-21 MED ORDER — PROTHROMBIN COMPLEX CONC HUMAN 500 UNITS IV KIT: 5500.0000 [IU] | PACK | Status: DC

## 2023-04-21 MED ORDER — APIXABAN 5 MG PO TABS: 5.0000 mg | ORAL_TABLET | Freq: Two times a day (BID) | ORAL | Status: DC

## 2023-04-21 MED ORDER — APIXABAN 5 MG PO TABS
10.0000 mg | ORAL_TABLET | Freq: Two times a day (BID) | ORAL | Status: DC
  Administered 2023-04-21: 10 mg via ORAL
  Filled 2023-04-21: qty 2

## 2023-04-21 NOTE — Progress Notes (Signed)
ANTICOAGULATION CONSULT NOTE - Follow Up Consult  Pharmacy Consult for heparin >> apixaban Indication: pulmonary embolus  Allergies  Allergen Reactions   Oxycontin [Oxycodone Hcl] Swelling    Pt tolerates hydromorphone.   Sulfa Antibiotics Other (See Comments)    Sores   Tramadol     "Makes me feel like I am falling"   Penicillins Rash    Tolerated augmentin    Sulfasalazine Other (See Comments)    Sores    Patient Measurements: Height: 5\' 1"  (154.9 cm) Weight: 110.3 kg (243 lb 2.7 oz) IBW/kg (Calculated) : 47.8 Heparin Dosing Weight: 76.4  Vital Signs: Temp: 98.4 F (36.9 C) (06/29 1355) Temp Source: Oral (06/29 1355) BP: 115/63 (06/29 1355) Pulse Rate: 83 (06/29 1355)  Labs: Recent Labs    04/19/23 0303 04/19/23 1224 04/19/23 2140 04/20/23 0410 04/21/23 0427  HGB 10.1*  --   --  10.3* 10.7*  HCT 31.8*  --   --  32.8* 33.8*  PLT 223  --   --  252 262  HEPARINUNFRC 0.86*   < > 0.60 0.62 0.54  CREATININE 2.02*  --   --  1.94* 1.84*   < > = values in this interval not displayed.     Estimated Creatinine Clearance: 29.9 mL/min (A) (by C-G formula based on SCr of 1.84 mg/dL (H)).   Medical History: Past Medical History:  Diagnosis Date   AC (acromioclavicular) joint bone spurs    lt shoulder   Acute kidney failure (HCC)    Anemia    Arthritis    Carpal tunnel syndrome, bilateral    CHF (congestive heart failure) (HCC)    Chronic kidney disease    Chronic pain syndrome    Cognitive communication deficit    Collagen vascular disease (HCC)    Dysphagia    Gastroesophageal reflux    Headache    recent visit to ER @ [facility] for severe headache   Hypertension    Lumbar stenosis    Hx of ESIs by Dr. Ethelene Hal   Major depressive disorder    Metabolic encephalopathy    Muscle weakness    Polyarthralgia    Polymyalgia (HCC)    Shingles    Spinal stenosis     Medications:  Medications Prior to Admission  Medication Sig Dispense Refill Last Dose    acetaminophen (TYLENOL) 325 MG tablet Take 2 tablets (650 mg total) by mouth every 6 (six) hours as needed for mild pain or headache (or Fever >/= 101).   UNK   allopurinol (ZYLOPRIM) 100 MG tablet Take 100 mg by mouth daily.   04/18/2023   atorvastatin (LIPITOR) 20 MG tablet Take 20 mg by mouth every morning.   04/18/2023   citalopram (CELEXA) 20 MG tablet Take 20 mg by mouth daily.   04/18/2023   gabapentin (NEURONTIN) 300 MG capsule Take 300 mg by mouth 3 (three) times daily.   04/18/2023   meloxicam (MOBIC) 7.5 MG tablet Take 7.5 mg by mouth every morning.   04/18/2023   metoprolol tartrate (LOPRESSOR) 25 MG tablet Take 25 mg by mouth 2 (two) times daily.   04/18/2023 at AM   montelukast (SINGULAIR) 10 MG tablet Take 10 mg by mouth at bedtime.   04/18/2023   predniSONE (DELTASONE) 10 MG tablet Take 10 mg by mouth daily with breakfast.   04/18/2023   spironolactone (ALDACTONE) 25 MG tablet Take 25 mg by mouth daily.   04/18/2023   tamsulosin (FLOMAX) 0.4 MG CAPS capsule Take  0.4 mg by mouth daily after breakfast.   04/18/2023   Torsemide 40 MG TABS Take 40 mg by mouth 2 (two) times daily. Dose to be further adjusted and medication resume when renal function fully recovered and patient able to tolerate PO's.   04/18/2023   traZODone (DESYREL) 50 MG tablet Take 25 mg by mouth at bedtime.   04/17/2023   trolamine salicylate (ASPERCREME) 10 % cream Apply 1 application  topically as needed for muscle pain.   UNK   calcitRIOL (ROCALTROL) 0.25 MCG capsule Take 0.25 mcg by mouth See admin instructions. Take 0.25 every Mon, Wed, Fri. (Patient not taking: Reported on 08/02/2022)   Not Taking   colchicine 0.6 MG tablet Take 1 tablet (0.6 mg total) by mouth daily. Take 1.2mg  (2 tablets) then 0.6mg  (1 tablet) 1 hour after. Then, take 1 tablet every day for 7 days. (Patient not taking: Reported on 04/19/2023) 10 tablet 2 Completed Course   predniSONE (DELTASONE) 20 MG tablet Take 1 tablet (20 mg total) by mouth daily with  breakfast for 5 days. (Patient not taking: Reported on 04/19/2023) 5 tablet 0 Completed Course    Assessment: 77 yo female with history of morbid obesity and HTN presents after several days of chest pain. Patient with previously high troponin per note. Patient instructed to present to the ED for evaluation for persistent chest pain. D-dimer elevated as well as elevated SCR of 2.24. Dopplers for DVT were negative, but patient with VQ scan positive for a RLL perfusion defect compatible with pulmonary embolism. Hgb 10.3, PLT 260. No PTA anticoagulation noted in med list or ED notes.   6/29: Now transitioning from heparin to apixaban    Goal of Therapy:  Heparin level 0.3-0.7 units/ml Monitor platelets by anticoagulation protocol: Yes   Plan:  Stop heparin infusion  Start apixaban 10 mg twice daily x 7 days followed by apixaban 5 mg twice daily. Monitor for bleeding  Judeth Cornfield, PharmD Clinical Pharmacist 04/21/2023 3:50 PM

## 2023-04-21 NOTE — Progress Notes (Signed)
ANTICOAGULATION CONSULT NOTE - Follow Up Consult  Pharmacy Consult for heparin Indication: pulmonary embolus  Allergies  Allergen Reactions   Oxycontin [Oxycodone Hcl] Swelling    Pt tolerates hydromorphone.   Sulfa Antibiotics Other (See Comments)    Sores   Tramadol     "Makes me feel like I am falling"   Penicillins Rash    Tolerated augmentin    Sulfasalazine Other (See Comments)    Sores    Patient Measurements: Height: 5\' 1"  (154.9 cm) Weight: 110.3 kg (243 lb 2.7 oz) IBW/kg (Calculated) : 47.8 Heparin Dosing Weight: 76.4  Vital Signs: Temp: 97.8 F (36.6 C) (06/29 0443) BP: 132/72 (06/29 0443) Pulse Rate: 106 (06/29 0443)  Labs: Recent Labs    04/18/23 1235 04/18/23 1505 04/19/23 0303 04/19/23 1224 04/19/23 2140 04/20/23 0410 04/21/23 0427  HGB 10.3*  --  10.1*  --   --  10.3* 10.7*  HCT 33.0*  --  31.8*  --   --  32.8* 33.8*  PLT 260  --  223  --   --  252 262  HEPARINUNFRC  --   --  0.86*   < > 0.60 0.62 0.54  CREATININE 2.24*  --  2.02*  --   --  1.94* 1.84*  TROPONINIHS 10 9  --   --   --   --   --    < > = values in this interval not displayed.     Estimated Creatinine Clearance: 29.9 mL/min (A) (by C-G formula based on SCr of 1.84 mg/dL (H)).   Medical History: Past Medical History:  Diagnosis Date   AC (acromioclavicular) joint bone spurs    lt shoulder   Acute kidney failure (HCC)    Anemia    Arthritis    Carpal tunnel syndrome, bilateral    CHF (congestive heart failure) (HCC)    Chronic kidney disease    Chronic pain syndrome    Cognitive communication deficit    Collagen vascular disease (HCC)    Dysphagia    Gastroesophageal reflux    Headache    recent visit to ER @ [facility] for severe headache   Hypertension    Lumbar stenosis    Hx of ESIs by Dr. Ethelene Hal   Major depressive disorder    Metabolic encephalopathy    Muscle weakness    Polyarthralgia    Polymyalgia (HCC)    Shingles    Spinal stenosis      Medications:  Medications Prior to Admission  Medication Sig Dispense Refill Last Dose   acetaminophen (TYLENOL) 325 MG tablet Take 2 tablets (650 mg total) by mouth every 6 (six) hours as needed for mild pain or headache (or Fever >/= 101).   UNK   allopurinol (ZYLOPRIM) 100 MG tablet Take 100 mg by mouth daily.   04/18/2023   atorvastatin (LIPITOR) 20 MG tablet Take 20 mg by mouth every morning.   04/18/2023   citalopram (CELEXA) 20 MG tablet Take 20 mg by mouth daily.   04/18/2023   gabapentin (NEURONTIN) 300 MG capsule Take 300 mg by mouth 3 (three) times daily.   04/18/2023   meloxicam (MOBIC) 7.5 MG tablet Take 7.5 mg by mouth every morning.   04/18/2023   metoprolol tartrate (LOPRESSOR) 25 MG tablet Take 25 mg by mouth 2 (two) times daily.   04/18/2023 at AM   montelukast (SINGULAIR) 10 MG tablet Take 10 mg by mouth at bedtime.   04/18/2023   predniSONE (DELTASONE) 10  MG tablet Take 10 mg by mouth daily with breakfast.   04/18/2023   spironolactone (ALDACTONE) 25 MG tablet Take 25 mg by mouth daily.   04/18/2023   tamsulosin (FLOMAX) 0.4 MG CAPS capsule Take 0.4 mg by mouth daily after breakfast.   04/18/2023   Torsemide 40 MG TABS Take 40 mg by mouth 2 (two) times daily. Dose to be further adjusted and medication resume when renal function fully recovered and patient able to tolerate PO's.   04/18/2023   traZODone (DESYREL) 50 MG tablet Take 25 mg by mouth at bedtime.   04/17/2023   trolamine salicylate (ASPERCREME) 10 % cream Apply 1 application  topically as needed for muscle pain.   UNK   calcitRIOL (ROCALTROL) 0.25 MCG capsule Take 0.25 mcg by mouth See admin instructions. Take 0.25 every Mon, Wed, Fri. (Patient not taking: Reported on 08/02/2022)   Not Taking   colchicine 0.6 MG tablet Take 1 tablet (0.6 mg total) by mouth daily. Take 1.2mg  (2 tablets) then 0.6mg  (1 tablet) 1 hour after. Then, take 1 tablet every day for 7 days. (Patient not taking: Reported on 04/19/2023) 10 tablet 2  Completed Course   predniSONE (DELTASONE) 20 MG tablet Take 1 tablet (20 mg total) by mouth daily with breakfast for 5 days. (Patient not taking: Reported on 04/19/2023) 5 tablet 0 Completed Course    Assessment: 77 yo female with history of morbid obesity and HTN presents after several days of chest pain. Patient with previously high troponin per note. Patient instructed to present to the ED for evaluation for persistent chest pain. D-dimer elevated as well as elevated SCR of 2.24. Dopplers for DVT were negative, but patient with VQ scan positive for a RLL perfusion defect compatible with pulmonary embolism. Hgb 10.3, PLT 260. No PTA anticoagulation noted in med list or ED notes.   HL 0.54- therapeutic    Goal of Therapy:  Heparin level 0.3-0.7 units/ml Monitor platelets by anticoagulation protocol: Yes   Plan:  Continue heparin drip at 1150 units/hr Heparin level and H&H daily Monitor for bleeding  Judeth Cornfield, PharmD Clinical Pharmacist 04/21/2023 7:45 AM

## 2023-04-21 NOTE — Progress Notes (Signed)
Patient slept on and off this shift. Norco given once this shift for back pain and patient stated "My Leg still feel like pins an needles.". Two assist to readjust patient in bed.  Continued to monitor

## 2023-04-21 NOTE — Consult Note (Signed)
Vascular and Interventional Radiology  On Call Brief Consult Note  Patient: Nicole [name] DOB: [birth year] Medical Record Number: [id number] Note Date/Time: 04/21/23 5:47 PM   Admitting Diagnosis: Pulmonary embolism (HCC) [I26.99] Acute pulmonary embolism without acute cor pulmonale, unspecified pulmonary embolism type (HCC) [I26.99] Acute pulmonary embolus (HCC) [I26.99]  1. Acute pulmonary embolism without acute cor pulmonale, unspecified pulmonary embolism type [facility])      Assessment:  77 y.o. year old female whom [facility] Hospitalist reached out to VIR On Call Physician with concern for rectus sheath hematoma. Pt with perfusion defect on NM V/Q scan c/w PE and recently transitioned from heparin gtt to apixaban. CT [facility] WO obtained for abdominal pain and revealing a rectus sheath hematoma. Pt remains VSS / HDS  Imaging independently reviewed, demonstrating a small-to-moderate sized rectus sheath hematoma    Plan: - No acute VIR intervention at this time. - Recommend conservative Rx wih DC AC and consider reversal. - BLE venous duplex w/o DVT however may consider IVC filter placement if Pt is contraindicated for [facility] re-attempt by Heme Onc   As part of this Telephone encounter, no in-person exam was conducted.  The patient was physically located in [place] or a state in which I am permitted to provide care. The encounter was reasonable and appropriate under the circumstances given the patient's presentation at the time.    Roanna Banning, MD [facility]   Pager. [phone] Clinic. [phone]

## 2023-04-21 NOTE — Progress Notes (Addendum)
PROGRESS NOTE  Nicole [name] XBJ:[id number] DOB: 1946-01-07 DOA: 04/18/2023 PCP: [name], PA-C  Brief History:  77 year old female with a history of hypertension, polymyalgia rheumatica on chronic prednisone, rheumatoid arthritis on chronic prednisone, esophageal dysmotility, hyperlipidemia, secondary hyperparathyroidism, temporal arteritis and chronic lower extremity edema presenting with worsening shortness of breath and dyspnea on exertion over the past several weeks. The patient is a difficult historian.  History is supplemented from speaking with the patient's son and reviewed the medical record.  Nevertheless, it appears that the patient has been having shortness of breath and dyspnea on exertion at least for the past year, but it has worsened in the past month.  The patient has chronic lower extremity edema which has been well-documented throughout the medical record, particularly their evaluations from cardiology Eden Emms) and vascular surgery (Early).  Nevertheless, the patient feels that that her edema has been worsened over the past several weeks.  She states that she has been having some chest pressure on and off at rest as well as with movement.  Most of the time, she takes some antacids, and she has relief for several hours before it returns.  In addition she states that belching also seems to help her chest discomfort.  She denies any nausea, vomiting, diarrhea, abdominal pain, fevers, chills.  There is no hematochezia or melena.  Interestingly, she states that she saw her PCP about 3 weeks prior to this admission because of her edema and shortness of breath.  She states that her PCP increased her diuretic to 3 times daily from once daily.  However review of the medical record shows that she had been on torsemide 40 mg twice daily in the past.  In the ED, the patient was afebrile and hemodynamically stable with oxygen saturation 96% room air.  WBC 9.5, hemoglobin 10.3,  platelets 250,000.Sodium 135, potassium 5.2, bicarbonate 26, serum creatinine 2.24.  The patient was started on IV furosemide and given Lokelma.  VQ scan showed a single wedge-shaped perfusion defect in the lateral aspect of the superior right lower lobe consistent with pulmonary embolus.  The patient was started on IV heparin.   Assessment/Plan:  Acute pulmonary embolus -Stable on room air presently -04/18/2023 VQ scan--single which showed perfusion defect in the lateral aspect of superior RLL c/w PE -Continue IV heparin>>transition to po apixaban 6/29 -6/27 Echo EF 60-65%, no wMA, G1DD, normal RVF   Acute on chronic HFpEF -6/27 echo--EF 60-65%, no WMA, G1DD, normal RVF -also partly due to progression of her underlying CKD as well as lymphedema -6/27 Echo EF 60-65%, no wMA, G1DD, normal RVF -Urine protein creatinine ratio--no significant proteinuria -11/30/2022 office weight 242 pounds -6/26 admission weight 254 lbs -continue lasix IV 40 mg bid -Neg 5L   CKD stage IV -Baseline creatinine 2.0-2.2 -Monitor with diuretics   Chest discomfort -Mostly atypical by history -Primarily due to GI and musculoskeletal etiology -Certainly, her PE may have contributed -Reproducible on palpation -troponin 10>>9 -6/27 Echo EF 60-65%, no wMA, G1DD, normal RVF   Morbid obesity -BMI 47.57 -Lifestyle modification   Physical deconditioning -At baseline, the patient is able to bear weight and make a transfer with assistance   Opioid dependence/chronic pain syndrome -PDMP reviewed -Norco 5/325, #120, last refill 04/13/2023 -Continue gabapentin   Lower extremity edema -Multifactorial including chronic prednisone, progression of CKD, lymphedema -Venous duplex negative for DVT -07/20/2021 echo EF 65 to 70%, no WMA, normal RV function  Polymyalgia rheumatica/rheumatoid arthritis -Patient is on chronic prednisone 10 mg daily -Previously on [place], but stopped secondary to her frequent  hospitalizations and infections in the past -Not a candidate for further Biologics per last visit with rheumatology   Mixed hyperlipidemia -Continue statin   Abdominal pain -CT abd/pelvis -UA--neg for pyuria                 Family Communication:   son updated 6/29   Consultants:  none   Code Status:  FULL    DVT Prophylaxis:  IV Heparin      Procedures: As Listed in Progress Note Above   Antibiotics: None           Subjective: Pt states cp and sob are improving.  Had some RLQ abd pain today.  Denies n/v/d, abd pain, f/c, cp  Objective: Vitals:   04/20/23 1301 04/20/23 2015 04/21/23 0443 04/21/23 1355  BP: 125/73 109/60 132/72 115/63  Pulse: 91 89 (!) 106 83  Resp: 18 20 20 19   Temp: 99 F (37.2 C) 98.9 F (37.2 C) 97.8 F (36.6 C) 98.4 F (36.9 C)  TempSrc: Oral   Oral  SpO2: 94% 96% 96% 93%  Weight:   110.3 kg   Height:        Intake/Output Summary (Last 24 hours) at 04/21/2023 1547 Last data filed at 04/21/2023 1534 Gross per 24 hour  Intake 847.53 ml  Output 2350 ml  Net -1502.47 ml   Weight change: -7 kg Exam:  General:  Pt is alert, follows commands appropriately, not in acute distress HEENT: No icterus, No thrush, No neck mass, [place]/AT Cardiovascular: RRR, S1/S2, no rubs, no gallops Respiratory: bibasilar crackles.  No wheeze Abdomen: Soft/+BS, non tender, non distended, no guarding Extremities: 1+LEedema, No lymphangitis, No petechiae, No rashes, no synovitis   Data Reviewed: I have personally reviewed following labs and imaging studies Basic Metabolic Panel: Recent Labs  Lab 04/18/23 1235 04/19/23 0303 04/20/23 0410 04/21/23 0427  NA 135 138 137 136  K 5.2* 3.7 3.6 3.5  CL 97* 101 98 96*  CO2 26 27 28 28   GLUCOSE 110* 88 95 90  BUN 62* 50* 45* 36*  CREATININE 2.24* 2.02* 1.94* 1.84*  CALCIUM 9.0 8.9 8.9 9.3  MG  --   --  2.1 1.9   Liver Function Tests: Recent Labs  Lab 04/20/23 0410  AST 20  ALT 22  ALKPHOS 58   BILITOT 0.5  PROT 6.4*  ALBUMIN 3.0*   No results for input(s): "LIPASE", "AMYLASE" in the last 168 hours. No results for input(s): "AMMONIA" in the last 168 hours. Coagulation Profile: No results for input(s): "INR", "PROTIME" in the last 168 hours. CBC: Recent Labs  Lab 04/18/23 1235 04/19/23 0303 04/20/23 0410 04/21/23 0427  WBC 9.5 8.1 8.8 9.5  HGB 10.3* 10.1* 10.3* 10.7*  HCT 33.0* 31.8* 32.8* 33.8*  MCV 94.3 93.0 94.5 93.9  PLT 260 223 252 262   Cardiac Enzymes: No results for input(s): "CKTOTAL", "CKMB", "CKMBINDEX", "TROPONINI" in the last 168 hours. BNP: Invalid input(s): "POCBNP" CBG: No results for input(s): "GLUCAP" in the last 168 hours. HbA1C: No results for input(s): "HGBA1C" in the last 72 hours. Urine analysis:    Component Value Date/Time   COLORURINE STRAW (A) 04/21/2023 1212   APPEARANCEUR CLEAR 04/21/2023 1212   LABSPEC 1.005 04/21/2023 1212   PHURINE 7.0 04/21/2023 1212   GLUCOSEU NEGATIVE 04/21/2023 1212   HGBUR NEGATIVE 04/21/2023 1212   BILIRUBINUR NEGATIVE  04/21/2023 1212   BILIRUBINUR negative 02/14/2020 1328   KETONESUR NEGATIVE 04/21/2023 1212   PROTEINUR NEGATIVE 04/21/2023 1212   UROBILINOGEN 0.2 02/14/2020 1328   UROBILINOGEN 0.2 02/20/2014 1116   NITRITE NEGATIVE 04/21/2023 1212   LEUKOCYTESUR NEGATIVE 04/21/2023 1212   Sepsis Labs: @LABRCNTIP (procalcitonin:4,lacticidven:4) )No results found for this or any previous visit (from the past 240 hour(s)).   Scheduled Meds:  atorvastatin  20 mg Oral q morning   furosemide  40 mg Intravenous BID   gabapentin  300 mg Oral TID   predniSONE  10 mg Oral Q breakfast   Continuous Infusions:  heparin 1,150 Units/hr (04/21/23 0905)    Procedures/Studies: ECHOCARDIOGRAM COMPLETE  Result Date: 04/19/2023    ECHOCARDIOGRAM REPORT   Patient Name:   MACAYLAH BATER Date of Exam: 04/19/2023 Medical Rec #:  [id number]      Height:       61.0 in Accession #:    [id number]     Weight:        251.8 lb Date of Birth:  [birth year]       BSA:          2.083 m Patient Age:    76 years       BP:           111/59 mmHg Patient Gender: F              HR:           93 bpm. [facility] Procedure: 2D Echo, Cardiac Doppler and Color Doppler Indications:    Congestive Heart Failure I50.9  History:        Patient has prior history of Echocardiogram examinations, most                 recent 07/20/2021. CHF; Risk Factors:Hypertension and                 Dyslipidemia.  Sonographer:    Celesta Gentile RCS Referring Phys: [phone] Heloise Beecham [facility]  Sonographer Comments: Technically difficult study due to poor echo windows and patient is obese. IMPRESSIONS  1. Left ventricular ejection fraction, by estimation, is 60 to 65%. The left ventricle has normal function. The left ventricle has no regional wall motion abnormalities. There is mild left ventricular hypertrophy. Left ventricular diastolic parameters are consistent with Grade I diastolic dysfunction (impaired relaxation).  2. Right ventricular systolic function is normal. The right ventricular size is mildly enlarged. Tricuspid regurgitation signal is inadequate for assessing PA pressure.  3. The mitral valve was not well visualized. No evidence of mitral valve regurgitation. No evidence of mitral stenosis.  4. The aortic valve was not well visualized. Aortic valve regurgitation is not visualized. No aortic stenosis is present.  5. The inferior vena cava is dilated in size with >50% respiratory variability, suggesting right atrial pressure of 8 mmHg. FINDINGS  Left Ventricle: Left ventricular ejection fraction, by estimation, is 60 to 65%. The left ventricle has normal function. The left ventricle has no regional wall motion abnormalities. Definity contrast agent was given IV to delineate the left ventricular  endocardial borders. The left ventricular internal cavity size was normal in size. There is mild left ventricular hypertrophy. Left ventricular  diastolic parameters are consistent with Grade I diastolic dysfunction (impaired relaxation). Right Ventricle: The right ventricular size is mildly enlarged. No increase in right ventricular wall thickness. Right ventricular systolic function is normal. Tricuspid regurgitation signal is inadequate for assessing PA pressure. Left Atrium: Left  atrial size was normal in size. Right Atrium: Right atrial size was normal in size. Pericardium: Trivial pericardial effusion is present. Mitral Valve: The mitral valve was not well visualized. No evidence of mitral valve regurgitation. No evidence of mitral valve stenosis. Tricuspid Valve: The tricuspid valve is normal in structure. Tricuspid valve regurgitation is not demonstrated. No evidence of tricuspid stenosis. Aortic Valve: The aortic valve was not well visualized. Aortic valve regurgitation is not visualized. No aortic stenosis is present. Aortic valve mean gradient measures 8.0 mmHg. Aortic valve peak gradient measures 13.2 mmHg. Aortic valve area, by VTI measures 1.32 cm. Pulmonic Valve: The pulmonic valve was not well visualized. Pulmonic valve regurgitation is trivial. No evidence of pulmonic stenosis. Aorta: The aortic root is normal in size and structure. Venous: The inferior vena cava is dilated in size with greater than 50% respiratory variability, suggesting right atrial pressure of 8 mmHg. IAS/Shunts: The interatrial septum was not well visualized.  LEFT VENTRICLE PLAX 2D LVIDd:         3.70 cm   Diastology LVIDs:         1.90 cm   LV e' lateral:   8.05 cm/s LV PW:         1.40 cm   LV E/e' lateral: 9.8 LV IVS:        1.30 cm LVOT diam:     1.70 cm LV SV:         53 LV SV Index:   26 LVOT Area:     2.27 cm  LEFT ATRIUM             Index LA diam:        3.30 cm 1.58 cm/m LA Vol (A2C):   29.8 ml 14.31 ml/m LA Vol (A4C):   29.1 ml 13.97 ml/m LA Biplane Vol: 29.6 ml 14.21 ml/m  AORTIC VALVE AV Area (Vmax):    1.30 cm AV Area (Vmean):   1.37 cm AV Area  (VTI):     1.32 cm AV Vmax:           182.00 cm/s AV Vmean:          135.000 cm/s AV VTI:            0.401 m AV Peak Grad:      13.2 mmHg AV Mean Grad:      8.0 mmHg LVOT Vmax:         104.00 cm/s LVOT Vmean:        81.600 cm/s LVOT VTI:          0.234 m LVOT/AV VTI ratio: 0.58  AORTA Ao Root diam: 3.70 cm MITRAL VALVE MV Area (PHT): 2.95 cm     SHUNTS MV Decel Time: 257 msec     Systemic VTI:  0.23 m MV E velocity: 79.00 cm/s   Systemic Diam: 1.70 cm MV A velocity: 136.00 cm/s MV E/A ratio:  0.58 Vishnu Priya Mallipeddi Electronically signed by Winfield Rast Mallipeddi Signature Date/Time: 04/19/2023/12:18:16 PM    Final    NM Pulmonary Perfusion  Result Date: 04/18/2023 CLINICAL DATA:  Pulmonary embolism (PE) suspected, low to intermediate prob, positive D-dimer EXAM: NUCLEAR MEDICINE PERFUSION LUNG SCAN TECHNIQUE: Perfusion images were obtained in multiple projections after intravenous injection of radiopharmaceutical. Ventilation scans intentionally deferred if perfusion scan and chest x-ray adequate for interpretation during COVID 19 epidemic. RADIOPHARMACEUTICALS:  4.4 mCi Tc-86m MAA IV COMPARISON:  04/18/2023 FINDINGS: Planar images of the lungs are obtained in multiple projections during  the perfusion phase of the exam. There is a single wedge-shaped subsegmental perfusion defect within the lateral aspect of the superior segment right lower lobe. No other perfusion defects. IMPRESSION: 1. Single wedge-shaped perfusion defect within the lateral aspect superior segment right lower lobe, compatible with pulmonary embolus based on PISAPED criteria. Critical Value/emergent results were called by telephone at the time of interpretation on 04/18/2023 at 5:27pm to provider Dr. Charm Barges, who verbally acknowledged these results. Electronically Signed   By: Sharlet Salina M.D.   On: 04/18/2023 17:42   US Venous Img Lower Bilateral (DVT)  Result Date: 04/18/2023 CLINICAL DATA:  Bilateral edema, worsening right.   Pain EXAM: BILATERAL LOWER EXTREMITY VENOUS DOPPLER ULTRASOUND TECHNIQUE: Gray-scale sonography with compression, as well as color and duplex ultrasound, were performed to evaluate the deep venous system(s) from the level of the common femoral vein through the popliteal and proximal calf veins. COMPARISON:  08/29/2007 FINDINGS: VENOUS Normal compressibility of the common femoral, superficial femoral, and popliteal veins, as well as the visualized calf veins. Visualized portions of profunda femoral vein and great saphenous vein unremarkable. No filling defects to suggest DVT on grayscale or color Doppler imaging. Doppler waveforms show normal direction of venous flow, normal respiratory plasticity and response to augmentation. OTHER Marked subcutaneous edema in the distal left calf. Limitations: Technologist describes technically difficult study secondary to body habitus, pitting edema, and patient pain intolerance limiting calf vein evaluation. IMPRESSION: No femoropopliteal DVT . If clinical symptoms are inconsistent or if there are persistent or worsening symptoms, further imaging (possibly involving the iliac veins) may be warranted. Electronically Signed   By: Corlis Leak M.D.   On: 04/18/2023 15:09   DG Chest 2 View  Result Date: 04/18/2023 CLINICAL DATA:  Chest pain EXAM: CHEST - 2 VIEW COMPARISON:  03/27/2019 FINDINGS: Underinflation. Bronchovascular crowding. Borderline cardiopericardial silhouette with a calcified aorta. Left midlung scar or atelectasis is once again identified. No pneumothorax or effusion. Overlapping cardiac leads. Degenerative changes of the shoulders and spine. Under penetrated radiograph IMPRESSION: Underinflation with bronchovascular crowding. Stable left midlung scar or atelectasis. Electronically Signed   By: Karen Kays M.D.   On: 04/18/2023 13:06   DG Chest 2 View  Result Date: 04/01/2023 CLINICAL DATA:  Chest pain. EXAM: CHEST - 2 VIEW COMPARISON:  Chest radiograph  12/25/2021 FINDINGS: Cardiomegaly. Aortic tortuosity. Bandlike opacity left mid lung. Low lung volumes. No pleural effusion or pneumothorax. Extensive bilateral shoulder joint degenerative changes. IMPRESSION: Bandlike opacity left mid lung favored to represent atelectasis. Electronically Signed   By: Annia Belt M.D.   On: 04/01/2023 21:25    Catarina Hartshorn, DO  [facility]  If 7PM-7AM, please contact night-coverage [email or web address] Password [facility] 04/21/2023, 3:47 PM   LOS: 2 days

## 2023-04-21 NOTE — Discharge Instructions (Signed)
Information on my medicine - ELIQUIS (apixaban)  This medication education was reviewed with me or my healthcare representative as part of my discharge preparation.   Why was Eliquis prescribed for you? Eliquis was prescribed to treat blood clots that may have been found in the veins of your legs (deep vein thrombosis) or in your lungs (pulmonary embolism) and to reduce the risk of them occurring again.  What do You need to know about Eliquis ? The starting dose is 10 mg (two 5 mg tablets) taken TWICE daily for the FIRST SEVEN (7) DAYS, then the dose is reduced to ONE 5 mg tablet taken TWICE daily.  Eliquis may be taken with or without food.   Try to take the dose about the same time in the morning and in the evening. If you have difficulty swallowing the tablet whole please discuss with your pharmacist how to take the medication safely.  Take Eliquis exactly as prescribed and DO NOT stop taking Eliquis without talking to the doctor who prescribed the medication.  Stopping may increase your risk of developing a new blood clot.  Refill your prescription before you run out.  After discharge, you should have regular check-up appointments with your healthcare provider that is prescribing your Eliquis.    What do you do if you miss a dose? If a dose of ELIQUIS is not taken at the scheduled time, take it as soon as possible on the same day and twice-daily administration should be resumed. The dose should not be doubled to make up for a missed dose.  Important Safety Information A possible side effect of Eliquis is bleeding. You should call your healthcare provider right away if you experience any of the following: Bleeding from an injury or your nose that does not stop. Unusual colored urine (red or dark brown) or unusual colored stools (red or black). Unusual bruising for unknown reasons. A serious fall or if you hit your head (even if there is no bleeding).  Some medicines may  interact with Eliquis and might increase your risk of bleeding or clotting while on Eliquis. To help avoid this, consult your healthcare provider or pharmacist prior to using any new prescription or non-prescription medications, including herbals, vitamins, non-steroidal anti-inflammatory drugs (NSAIDs) and supplements.  This website has more information on Eliquis (apixaban): http://www.eliquis.com/eliquis/home  

## 2023-04-21 NOTE — Progress Notes (Addendum)
I reviewed CT abd/pelvis results>>> Layering rectus sheath hematoma along the inferior aspect of the right rectus musculature measuring 8.4 x 8.4 x 4.6 cm   --pt has new spontaneous bleed --pt had already received first dose of apixaban --discussed with general surgery--Dr. Jenkins>>non-operative management --discussed with IR--Dr. Mugweru>>no immediate indication for embolization presently.  Recommended stopping anticoagulation and monitor clinically.  If pt becomes clinically unstable, then order CTA of abdomen and pelvis STAT to rule out any active bleeding;  if CTA is positive, then reconsult IR --discussed with pharmacist regarding KCentra.  As pt currently has acute PE, and K Werner Lean is prothrombotic, there is possibility of causing more clots.  Recommending holding KCentra as pt is clinically stable.    I would Give KCentra 5000 units (full dose) if she becomes unstable.  Patient and son updated about current care and plan of care.  No plans for further anticoagulation going forward.  Plan for IVC filter when pt is stable.  They understand and agree with plan  Transfer pt to SDU for closer monitoring.  Trend H/H.    Nicole Hartshorn, DO

## 2023-04-22 DIAGNOSIS — I2609 Other pulmonary embolism with acute cor pulmonale: Secondary | ICD-10-CM | POA: Diagnosis not present

## 2023-04-22 DIAGNOSIS — S301XXD Contusion of abdominal wall, subsequent encounter: Secondary | ICD-10-CM

## 2023-04-22 DIAGNOSIS — G894 Chronic pain syndrome: Secondary | ICD-10-CM | POA: Diagnosis not present

## 2023-04-22 LAB — BASIC METABOLIC PANEL
Anion gap: 11 (ref 5–15)
BUN: 37 mg/dL — ABNORMAL HIGH (ref 8–23)
CO2: 28 mmol/L (ref 22–32)
Calcium: 9.1 mg/dL (ref 8.9–10.3)
Chloride: 93 mmol/L — ABNORMAL LOW (ref 98–111)
Creatinine, Ser: 1.87 mg/dL — ABNORMAL HIGH (ref 0.44–1.00)
GFR, Estimated: 28 mL/min — ABNORMAL LOW (ref >60)
Glucose, Bld: 89 mg/dL (ref 70–99)
Potassium: 3.5 mmol/L (ref 3.5–5.1)
Sodium: 132 mmol/L — ABNORMAL LOW (ref 135–145)

## 2023-04-22 LAB — HEMOGLOBIN AND HEMATOCRIT, BLOOD
HCT: 30.3 % — ABNORMAL LOW (ref 36.0–46.0)
HCT: 30.8 % — ABNORMAL LOW (ref 36.0–46.0)
HCT: 32.7 % — ABNORMAL LOW (ref 36.0–46.0)
HCT: 33.3 % — ABNORMAL LOW (ref 36.0–46.0)
HCT: 33.3 % — ABNORMAL LOW (ref 36.0–46.0)
Hemoglobin: 10.4 g/dL — ABNORMAL LOW (ref 12.0–15.0)
Hemoglobin: 10.5 g/dL — ABNORMAL LOW (ref 12.0–15.0)
Hemoglobin: 10.8 g/dL — ABNORMAL LOW (ref 12.0–15.0)
Hemoglobin: 9.6 g/dL — ABNORMAL LOW (ref 12.0–15.0)
Hemoglobin: 9.8 g/dL — ABNORMAL LOW (ref 12.0–15.0)

## 2023-04-22 LAB — TYPE AND SCREEN
ABO/RH(D): O POS
Antibody Screen: NEGATIVE

## 2023-04-22 LAB — MRSA NEXT GEN BY PCR, NASAL: MRSA by PCR Next Gen: NOT DETECTED

## 2023-04-22 LAB — MAGNESIUM: Magnesium: 1.9 mg/dL (ref 1.7–2.4)

## 2023-04-22 NOTE — Progress Notes (Signed)
PROGRESS NOTE  Nicole [name]:[id number] DOB: [birth year] DOA: 04/18/2023 PCP: [name], PA-C  Brief History:  77 year old female with a history of hypertension, polymyalgia rheumatica on chronic prednisone, rheumatoid arthritis on chronic prednisone, esophageal dysmotility, hyperlipidemia, secondary hyperparathyroidism, temporal arteritis and chronic lower extremity edema presenting with worsening shortness of breath and dyspnea on exertion over the past several weeks. The patient is a difficult historian.  History is supplemented from speaking with the patient's son and reviewed the medical record.  Nevertheless, it appears that the patient has been having shortness of breath and dyspnea on exertion at least for the past year, but it has worsened in the past month.  The patient has chronic lower extremity edema which has been well-documented throughout the medical record, particularly their evaluations from cardiology Eden Emms) and vascular surgery (Early).  Nevertheless, the patient feels that that her edema has been worsened over the past several weeks.  She states that she has been having some chest pressure on and off at rest as well as with movement.  Most of the time, she takes some antacids, and she has relief for several hours before it returns.  In addition she states that belching also seems to help her chest discomfort.  She denies any nausea, vomiting, diarrhea, abdominal pain, fevers, chills.  There is no hematochezia or melena.  Interestingly, she states that she saw her PCP about 3 weeks prior to this admission because of her edema and shortness of breath.  She states that her PCP increased her diuretic to 3 times daily from once daily.  However review of the medical record shows that she had been on torsemide 40 mg twice daily in the past.  In the ED, the patient was afebrile and hemodynamically stable with oxygen saturation 96% room air.  WBC 9.5, hemoglobin 10.3,  platelets 250,000.Sodium 135, potassium 5.2, bicarbonate 26, serum creatinine 2.24.  The patient was started on IV furosemide and given Lokelma.  VQ scan showed a single wedge-shaped perfusion defect in the lateral aspect of the superior right lower lobe consistent with pulmonary embolus.  The patient was started on IV heparin. She was subsequently transitioned to po apixaban.  Unfortunately, she developed a spontaneous rectus sheath hematoma.  Her anticoagulation was stopped and risks/benefits/alternatives were discussed with pt and family.   Assessment/Plan: Acute pulmonary embolus -Stable on room air presently -04/18/2023 VQ scan--single which showed perfusion defect in the lateral aspect of superior RLL c/w PE -Continue IV heparin>>transitioned to po apixaban 6/29 -6/27 Echo EF 60-65%, no wMA, G1DD, normal RVF -04/21/23 anticoagulation stopped due to spontaneous rectus sheath hematoma  Rectus Sheath Hematoma --6/29 CT abd--Layering rectus sheath hematoma along the inferior aspect of the right rectus musculature measuring 8.4 x 8.4 x 4.6 cm --spontaneous bleed, no trauma --discussed with general surgery--Dr. Jenkins>>non-operative management --discussed with IR--Dr. Mugweru>>no immediate indication for embolization presently.  Recommended stopping anticoagulation and monitor clinically.  If pt becomes clinically unstable, then order CTA of abdomen and pelvis STAT to rule out any active bleeding;  if CTA is positive, then reconsult IR --discussed with pharmacist regarding KCentra.  As pt currently has acute PE, and K Werner Lean is prothrombotic, there is possibility of causing more clots.  Recommending holding KCentra as pt is clinically stable.   -IVC filter when pt stable   Acute on chronic HFpEF -6/27 echo--EF 60-65%, no WMA, G1DD, normal RVF -also partly due to progression of her underlying CKD  as well as lymphedema -6/27 Echo EF 60-65%, no wMA, G1DD, normal RVF -Urine protein creatinine  ratio--no significant proteinuria -11/30/2022 office weight 242 pounds -6/26 admission weight 254 lbs -continue lasix IV 40 mg bid>>back to po torsemide when stable -Neg 6L   CKD stage IV -Baseline creatinine 2.0-2.2 -Monitor with diuretics   Chest discomfort -Mostly atypical by history -Primarily due to GI and musculoskeletal etiology -Certainly, her PE may have contributed -Reproducible on palpation -troponin 10>>9 -6/27 Echo EF 60-65%, no wMA, G1DD, normal RVF   Morbid obesity -BMI 47.57 -Lifestyle modification   Physical deconditioning -At baseline, the patient is able to bear weight and make a transfer with assistance   Opioid dependence/chronic pain syndrome -PDMP reviewed -Norco 5/325, #120, last refill 04/13/2023 -Continue gabapentin   Lower extremity edema -Multifactorial including chronic prednisone, progression of CKD, lymphedema -Venous duplex negative for DVT -07/20/2021 echo EF 65 to 70%, no WMA, normal RV function   Polymyalgia rheumatica/rheumatoid arthritis -Patient is on chronic prednisone 10 mg daily -Previously on Kevzara and Arava, but stopped secondary to her frequent hospitalizations and infections in the past -Not a candidate for further Biologics per last visit with rheumatology   Mixed hyperlipidemia -Continue statin   Abdominal pain -CT abd/pelvis -UA--neg for pyuria                 Family Communication:   son updated 6/30   Consultants:  none   Code Status:  FULL    DVT Prophylaxis:  IV Heparin      Procedures: As Listed in Progress Note Above   Antibiotics: None                Subjective: Pt states abd pain is a little better.  Denies f/c, cp, sob, n/v/d  Objective: Vitals:   04/22/23 1100 04/22/23 1400 04/22/23 1500 04/22/23 1619  BP: 113/60 112/67 (!) 118/56   Pulse: 91 94 96 97  Resp: 14 17 18 20   Temp: 98.8 F (37.1 C)   99 F (37.2 C)  TempSrc: Oral   Oral  SpO2: 98% 98% 97% 99%  Weight:       Height:        Intake/Output Summary (Last 24 hours) at 04/22/2023 1640 Last data filed at 04/22/2023 1230 Gross per 24 hour  Intake 655.16 ml  Output 1700 ml  Net -1044.84 ml   Weight change:  Exam:  General:  Pt is alert, follows commands appropriately, not in acute distress HEENT: No icterus, No thrush, No neck mass, [place]/AT Cardiovascular: RRR, S1/S2, no rubs, no gallops Respiratory: fine bibasilar crackles. No wheeze Abdomen: Soft/+BS, non tender, non distended, no guarding Extremities: trac LE edema, No lymphangitis, No petechiae, No rashes, no synovitis   Data Reviewed: I have personally reviewed following labs and imaging studies Basic Metabolic Panel: Recent Labs  Lab 04/18/23 1235 04/19/23 0303 04/20/23 0410 04/21/23 0427 04/22/23 0624  NA 135 138 137 136 132*  K 5.2* 3.7 3.6 3.5 3.5  CL 97* 101 98 96* 93*  CO2 26 27 28 28 28   GLUCOSE 110* 88 95 90 89  BUN 62* 50* 45* 36* 37*  CREATININE 2.24* 2.02* 1.94* 1.84* 1.87*  CALCIUM 9.0 8.9 8.9 9.3 9.1  MG  --   --  2.1 1.9 1.9   Liver Function Tests: Recent Labs  Lab 04/20/23 0410  AST 20  ALT 22  ALKPHOS 58  BILITOT 0.5  PROT 6.4*  ALBUMIN 3.0*   No results for input(s): "  LIPASE", "AMYLASE" in the last 168 hours. No results for input(s): "AMMONIA" in the last 168 hours. Coagulation Profile: No results for input(s): "INR", "PROTIME" in the last 168 hours. CBC: Recent Labs  Lab 04/18/23 1235 04/19/23 0303 04/20/23 0410 04/21/23 0427 04/21/23 1845 04/22/23 0010 04/22/23 0624 04/22/23 1141  WBC 9.5 8.1 8.8 9.5  --   --   --   --   HGB 10.3* 10.1* 10.3* 10.7* 10.4* 10.8* 10.4* 10.5*  HCT 33.0* 31.8* 32.8* 33.8* 32.5* 33.3* 33.3* 32.7*  MCV 94.3 93.0 94.5 93.9  --   --   --   --   PLT 260 223 252 262  --   --   --   --    Cardiac Enzymes: No results for input(s): "CKTOTAL", "CKMB", "CKMBINDEX", "TROPONINI" in the last 168 hours. BNP: Invalid input(s): "POCBNP" CBG: No results for input(s):  "GLUCAP" in the last 168 hours. HbA1C: No results for input(s): "HGBA1C" in the last 72 hours. Urine analysis:    Component Value Date/Time   COLORURINE STRAW (A) 04/21/2023 1212   APPEARANCEUR CLEAR 04/21/2023 1212   LABSPEC 1.005 04/21/2023 1212   PHURINE 7.0 04/21/2023 1212   GLUCOSEU NEGATIVE 04/21/2023 1212   HGBUR NEGATIVE 04/21/2023 1212   BILIRUBINUR NEGATIVE 04/21/2023 1212   BILIRUBINUR negative 02/14/2020 1328   KETONESUR NEGATIVE 04/21/2023 1212   PROTEINUR NEGATIVE 04/21/2023 1212   UROBILINOGEN 0.2 02/14/2020 1328   UROBILINOGEN 0.2 02/20/2014 1116   NITRITE NEGATIVE 04/21/2023 1212   LEUKOCYTESUR NEGATIVE 04/21/2023 1212   Sepsis Labs: @LABRCNTIP (procalcitonin:4,lacticidven:4) ) Recent Results (from the past 240 hour(s))  MRSA Next Gen by PCR, Nasal     Status: None   Collection Time: 04/21/23  6:29 PM   Specimen: Nasal Mucosa; Nasal Swab  Result Value Ref Range Status   MRSA by PCR Next Gen NOT DETECTED NOT DETECTED Final    Comment: (NOTE) The GeneXpert MRSA Assay (FDA approved for NASAL specimens only), is one component of a comprehensive MRSA colonization surveillance program. It is not intended to diagnose MRSA infection nor to guide or monitor treatment for MRSA infections. Test performance is not FDA approved in patients less than 30 years old. Performed at [facility], [address] [place], [place] [address]      Scheduled Meds:  atorvastatin  20 mg Oral q morning   Chlorhexidine Gluconate Cloth  6 each Topical Daily   gabapentin  300 mg Oral TID   predniSONE  10 mg Oral Q breakfast   Continuous Infusions:  Procedures/Studies: CT ABDOMEN PELVIS WO CONTRAST  Result Date: 04/21/2023 CLINICAL DATA:  Abdominal pain EXAM: CT ABDOMEN AND PELVIS WITHOUT CONTRAST TECHNIQUE: Multidetector CT imaging of the abdomen and pelvis was performed following the standard protocol without IV contrast. RADIATION DOSE REDUCTION: This exam was performed according  to the departmental dose-optimization program which includes automated exposure control, adjustment of the mA and/or kV according to patient size and/or use of iterative reconstruction technique. COMPARISON:  12/16/2021 FINDINGS: Lower chest: No acute abnormality. Coronary artery calcifications. Scarring or of the bilateral lung bases. Hepatobiliary: No focal liver abnormality is seen. Status post cholecystectomy. No biliary dilatation. Pancreas: Unremarkable. No pancreatic ductal dilatation or surrounding inflammatory changes. Spleen: Normal in size without significant abnormality. Adrenals/Urinary Tract: Adrenal glands are unremarkable. Kidneys are normal, without renal calculi, solid lesion, or hydronephrosis. Bladder is unremarkable. Stomach/Bowel: Stomach is within normal limits. Appendix appears normal. No evidence of bowel wall thickening, distention, or inflammatory changes. Descending and sigmoid diverticulosis.  Vascular/Lymphatic: Aortic atherosclerosis. No enlarged abdominal or pelvic lymph nodes. Reproductive: Exophytic calcified fibroid arising from the left aspect of the uterine fundus. Other: No abdominal wall hernia. Layering rectus sheath hematoma along the inferior aspect of the right rectus musculature measuring 8.4 x 8.4 x 4.6 cm (series 2, image 50, series 6, image 74). No ascites. Musculoskeletal: No acute or significant osseous findings. IMPRESSION: 1. Layering rectus sheath hematoma along the inferior aspect of the right rectus musculature measuring 8.4 x 8.4 x 4.6 cm. 2. Descending and sigmoid diverticulosis without evidence of acute diverticulitis. 3. Coronary artery disease. Aortic Atherosclerosis (ICD10-I70.0). Electronically Signed   By: Jearld Lesch M.D.   On: 04/21/2023 16:41   ECHOCARDIOGRAM COMPLETE  Result Date: 04/19/2023    ECHOCARDIOGRAM REPORT   Patient Name:   DAVEIGH KIMBEL Date of Exam: 04/19/2023 Medical Rec #:  [id number]      Height:       61.0 in Accession #:     [id number]     Weight:       251.8 lb Date of Birth:  Mar 20, 1946       BSA:          2.083 m Patient Age:    76 years       BP:           111/59 mmHg Patient Gender: F              HR:           93 bpm. [facility] Procedure: 2D Echo, Cardiac Doppler and Color Doppler Indications:    Congestive Heart Failure I50.9  History:        Patient has prior history of Echocardiogram examinations, most                 recent 07/20/2021. CHF; Risk Factors:Hypertension and                 Dyslipidemia.  Sonographer:    Celesta Gentile RCS Referring Phys: [phone] Heloise Beecham [facility]  Sonographer Comments: Technically difficult study due to poor echo windows and patient is obese. IMPRESSIONS  1. Left ventricular ejection fraction, by estimation, is 60 to 65%. The left ventricle has normal function. The left ventricle has no regional wall motion abnormalities. There is mild left ventricular hypertrophy. Left ventricular diastolic parameters are consistent with Grade I diastolic dysfunction (impaired relaxation).  2. Right ventricular systolic function is normal. The right ventricular size is mildly enlarged. Tricuspid regurgitation signal is inadequate for assessing PA pressure.  3. The mitral valve was not well visualized. No evidence of mitral valve regurgitation. No evidence of mitral stenosis.  4. The aortic valve was not well visualized. Aortic valve regurgitation is not visualized. No aortic stenosis is present.  5. The inferior vena cava is dilated in size with >50% respiratory variability, suggesting right atrial pressure of 8 mmHg. FINDINGS  Left Ventricle: Left ventricular ejection fraction, by estimation, is 60 to 65%. The left ventricle has normal function. The left ventricle has no regional wall motion abnormalities. Definity contrast agent was given IV to delineate the left ventricular  endocardial borders. The left ventricular internal cavity size was normal in size. There is mild left ventricular  hypertrophy. Left ventricular diastolic parameters are consistent with Grade I diastolic dysfunction (impaired relaxation). Right Ventricle: The right ventricular size is mildly enlarged. No increase in right ventricular wall thickness. Right ventricular systolic function is normal. Tricuspid regurgitation signal is inadequate for  assessing PA pressure. Left Atrium: Left atrial size was normal in size. Right Atrium: Right atrial size was normal in size. Pericardium: Trivial pericardial effusion is present. Mitral Valve: The mitral valve was not well visualized. No evidence of mitral valve regurgitation. No evidence of mitral valve stenosis. Tricuspid Valve: The tricuspid valve is normal in structure. Tricuspid valve regurgitation is not demonstrated. No evidence of tricuspid stenosis. Aortic Valve: The aortic valve was not well visualized. Aortic valve regurgitation is not visualized. No aortic stenosis is present. Aortic valve mean gradient measures 8.0 mmHg. Aortic valve peak gradient measures 13.2 mmHg. Aortic valve area, by VTI measures 1.32 cm. Pulmonic Valve: The pulmonic valve was not well visualized. Pulmonic valve regurgitation is trivial. No evidence of pulmonic stenosis. Aorta: The aortic root is normal in size and structure. Venous: The inferior vena cava is dilated in size with greater than 50% respiratory variability, suggesting right atrial pressure of 8 mmHg. IAS/Shunts: The interatrial septum was not well visualized.  LEFT VENTRICLE PLAX 2D LVIDd:         3.70 cm   Diastology LVIDs:         1.90 cm   LV e' lateral:   8.05 cm/s LV PW:         1.40 cm   LV E/e' lateral: 9.8 LV IVS:        1.30 cm LVOT diam:     1.70 cm LV SV:         53 LV SV Index:   26 LVOT Area:     2.27 cm  LEFT ATRIUM             Index LA diam:        3.30 cm 1.58 cm/m LA Vol (A2C):   29.8 ml 14.31 ml/m LA Vol (A4C):   29.1 ml 13.97 ml/m LA Biplane Vol: 29.6 ml 14.21 ml/m  AORTIC VALVE AV Area (Vmax):    1.30 cm AV Area  (Vmean):   1.37 cm AV Area (VTI):     1.32 cm AV Vmax:           182.00 cm/s AV Vmean:          135.000 cm/s AV VTI:            0.401 m AV Peak Grad:      13.2 mmHg AV Mean Grad:      8.0 mmHg LVOT Vmax:         104.00 cm/s LVOT Vmean:        81.600 cm/s LVOT VTI:          0.234 m LVOT/AV VTI ratio: 0.58  AORTA Ao Root diam: 3.70 cm MITRAL VALVE MV Area (PHT): 2.95 cm     SHUNTS MV Decel Time: 257 msec     Systemic VTI:  0.23 m MV E velocity: 79.00 cm/s   Systemic Diam: 1.70 cm MV A velocity: 136.00 cm/s MV E/A ratio:  0.58 Vishnu Priya Mallipeddi Electronically signed by Winfield Rast Mallipeddi Signature Date/Time: 04/19/2023/12:18:16 PM    Final    NM Pulmonary Perfusion  Result Date: 04/18/2023 CLINICAL DATA:  Pulmonary embolism (PE) suspected, low to intermediate prob, positive D-dimer EXAM: NUCLEAR MEDICINE PERFUSION LUNG SCAN TECHNIQUE: Perfusion images were obtained in multiple projections after intravenous injection of radiopharmaceutical. Ventilation scans intentionally deferred if perfusion scan and chest x-ray adequate for interpretation during COVID 19 epidemic. RADIOPHARMACEUTICALS:  4.4 mCi Tc-58m MAA IV COMPARISON:  04/18/2023 FINDINGS: Planar images of the lungs  are obtained in multiple projections during the perfusion phase of the exam. There is a single wedge-shaped subsegmental perfusion defect within the lateral aspect of the superior segment right lower lobe. No other perfusion defects. IMPRESSION: 1. Single wedge-shaped perfusion defect within the lateral aspect superior segment right lower lobe, compatible with pulmonary embolus based on PISAPED criteria. Critical Value/emergent results were called by telephone at the time of interpretation on 04/18/2023 at 5:27pm to provider Dr. Charm Barges, who verbally acknowledged these results. Electronically Signed   By: Sharlet Salina M.D.   On: 04/18/2023 17:42   US Venous Img Lower Bilateral (DVT)  Result Date: 04/18/2023 CLINICAL DATA:  Bilateral  edema, worsening right.  Pain EXAM: BILATERAL LOWER EXTREMITY VENOUS DOPPLER ULTRASOUND TECHNIQUE: Gray-scale sonography with compression, as well as color and duplex ultrasound, were performed to evaluate the deep venous system(s) from the level of the common femoral vein through the popliteal and proximal calf veins. COMPARISON:  08/29/2007 FINDINGS: VENOUS Normal compressibility of the common femoral, superficial femoral, and popliteal veins, as well as the visualized calf veins. Visualized portions of profunda femoral vein and great saphenous vein unremarkable. No filling defects to suggest DVT on grayscale or color Doppler imaging. Doppler waveforms show normal direction of venous flow, normal respiratory plasticity and response to augmentation. OTHER Marked subcutaneous edema in the distal left calf. Limitations: Technologist describes technically difficult study secondary to body habitus, pitting edema, and patient pain intolerance limiting calf vein evaluation. IMPRESSION: No femoropopliteal DVT . If clinical symptoms are inconsistent or if there are persistent or worsening symptoms, further imaging (possibly involving the iliac veins) may be warranted. Electronically Signed   By: Corlis Leak M.D.   On: 04/18/2023 15:09   DG Chest 2 View  Result Date: 04/18/2023 CLINICAL DATA:  Chest pain EXAM: CHEST - 2 VIEW COMPARISON:  03/27/2019 FINDINGS: Underinflation. Bronchovascular crowding. Borderline cardiopericardial silhouette with a calcified aorta. Left midlung scar or atelectasis is once again identified. No pneumothorax or effusion. Overlapping cardiac leads. Degenerative changes of the shoulders and spine. Under penetrated radiograph IMPRESSION: Underinflation with bronchovascular crowding. Stable left midlung scar or atelectasis. Electronically Signed   By: Karen Kays M.D.   On: 04/18/2023 13:06   DG Chest 2 View  Result Date: 04/01/2023 CLINICAL DATA:  Chest pain. EXAM: CHEST - 2 VIEW COMPARISON:   Chest radiograph 12/25/2021 FINDINGS: Cardiomegaly. Aortic tortuosity. Bandlike opacity left mid lung. Low lung volumes. No pleural effusion or pneumothorax. Extensive bilateral shoulder joint degenerative changes. IMPRESSION: Bandlike opacity left mid lung favored to represent atelectasis. Electronically Signed   By: Annia Belt M.D.   On: 04/01/2023 21:25    Catarina Hartshorn, DO  [facility]  If 7PM-7AM, please contact night-coverage [email or web address] Password [email or web address] 04/22/2023, 4:40 PM   LOS: 3 days

## 2023-04-22 NOTE — Progress Notes (Signed)
Patient noted with pain 10/10 to the left leg when getting out of bed to use the bedside commode. Oncoming nurse arrived and will administered PRN pain mediations.

## 2023-04-23 DIAGNOSIS — S301XXD Contusion of abdominal wall, subsequent encounter: Secondary | ICD-10-CM | POA: Diagnosis not present

## 2023-04-23 DIAGNOSIS — I2699 Other pulmonary embolism without acute cor pulmonale: Secondary | ICD-10-CM | POA: Diagnosis not present

## 2023-04-23 DIAGNOSIS — N184 Chronic kidney disease, stage 4 (severe): Secondary | ICD-10-CM | POA: Diagnosis not present

## 2023-04-23 LAB — BASIC METABOLIC PANEL
Anion gap: 11 (ref 5–15)
BUN: 39 mg/dL — ABNORMAL HIGH (ref 8–23)
CO2: 28 mmol/L (ref 22–32)
Calcium: 8.9 mg/dL (ref 8.9–10.3)
Chloride: 93 mmol/L — ABNORMAL LOW (ref 98–111)
Creatinine, Ser: 1.95 mg/dL — ABNORMAL HIGH (ref 0.44–1.00)
GFR, Estimated: 26 mL/min — ABNORMAL LOW (ref >60)
Glucose, Bld: 105 mg/dL — ABNORMAL HIGH (ref 70–99)
Potassium: 4.2 mmol/L (ref 3.5–5.1)
Sodium: 132 mmol/L — ABNORMAL LOW (ref 135–145)

## 2023-04-23 LAB — HEMOGLOBIN AND HEMATOCRIT, BLOOD
HCT: 32.3 % — ABNORMAL LOW (ref 36.0–46.0)
HCT: 29 % — ABNORMAL LOW (ref 36.0–46.0)
Hemoglobin: 10.1 g/dL — ABNORMAL LOW (ref 12.0–15.0)
Hemoglobin: 9.1 g/dL — ABNORMAL LOW (ref 12.0–15.0)

## 2023-04-23 LAB — MAGNESIUM: Magnesium: 2.3 mg/dL (ref 1.7–2.4)

## 2023-04-23 NOTE — Progress Notes (Signed)
PROGRESS NOTE  JOELINE ROCHESTER ZOX:[id number] DOB: 10/04/1946 DOA: 04/18/2023 PCP: [name], PA-C  Brief History:  77 year old female with a history of hypertension, polymyalgia rheumatica on chronic prednisone, rheumatoid arthritis on chronic prednisone, esophageal dysmotility, hyperlipidemia, secondary hyperparathyroidism, temporal arteritis and chronic lower extremity edema presenting with worsening shortness of breath and dyspnea on exertion over the past several weeks. The patient is a difficult historian.  History is supplemented from speaking with the patient's son and reviewed the medical record.  Nevertheless, it appears that the patient has been having shortness of breath and dyspnea on exertion at least for the past year, but it has worsened in the past month.  The patient has chronic lower extremity edema which has been well-documented throughout the medical record, particularly their evaluations from cardiology Eden Emms) and vascular surgery (Early).  Nevertheless, the patient feels that that her edema has been worsened over the past several weeks.  She states that she has been having some chest pressure on and off at rest as well as with movement.  Most of the time, she takes some antacids, and she has relief for several hours before it returns.  In addition she states that belching also seems to help her chest discomfort.  She denies any nausea, vomiting, diarrhea, abdominal pain, fevers, chills.  There is no hematochezia or melena.  Interestingly, she states that she saw her PCP about 3 weeks prior to this admission because of her edema and shortness of breath.  She states that her PCP increased her diuretic to 3 times daily from once daily.  However review of the medical record shows that she had been on torsemide 40 mg twice daily in the past.  In the ED, the patient was afebrile and hemodynamically stable with oxygen saturation 96% room air.  WBC 9.5, hemoglobin 10.3,  platelets 250,000.Sodium 135, potassium 5.2, bicarbonate 26, serum creatinine 2.24.  The patient was started on IV furosemide and given Lokelma.  VQ scan showed a single wedge-shaped perfusion defect in the lateral aspect of the superior right lower lobe consistent with pulmonary embolus.  The patient was started on IV heparin. She was subsequently transitioned to po apixaban.  Unfortunately, she developed a spontaneous rectus sheath hematoma.  Her anticoagulation was stopped and risks/benefits/alternatives were discussed with pt and family.  She was monitored off anticoagulation and she remained clinically stable.  Hematology was consulted for opinion.   Assessment/Plan:  Acute pulmonary embolus -Stable on room air presently -04/18/2023 VQ scan--single which showed perfusion defect in the lateral aspect of superior RLL c/w PE -Continue IV heparin>>transitioned to po apixaban 6/29 -6/27 Echo EF 60-65%, no wMA, G1DD, normal RVF -04/21/23 anticoagulation stopped due to spontaneous rectus sheath hematoma   Rectus Sheath Hematoma --6/29 CT abd--Layering rectus sheath hematoma along the inferior aspect of the right rectus musculature measuring 8.4 x 8.4 x 4.6 cm --spontaneous bleed, no trauma --discussed with general surgery--Dr. Jenkins>>non-operative management --discussed with IR--Dr. Mugweru>>no immediate indication for embolization presently.  Recommended stopping anticoagulation and monitor clinically.  If pt becomes clinically unstable, then order CTA of abdomen and pelvis STAT to rule out any active bleeding;  if CTA is positive, then reconsult IR --discussed with pharmacist regarding KCentra.  As pt currently has acute PE, and K Werner Lean is prothrombotic, there is possibility of causing more clots.  Recommending holding KCentra as pt is clinically stable.   -04/23/23--discussed with hematology--Dr. Ancil Linsey IVC filter for now; possible  restart AC once hematoma is stable/smaller -plan repeat  CT abd in next 24-48 hrs   Acute on chronic HFpEF -6/27 echo--EF 60-65%, no WMA, G1DD, normal RVF -also partly due to progression of her underlying CKD as well as lymphedema -6/27 Echo EF 60-65%, no wMA, G1DD, normal RVF -Urine protein creatinine ratio--no significant proteinuria -11/30/2022 office weight 242 pounds -6/26 admission weight 254 lbs -continue lasix IV 40 mg bid>>back to po torsemide when stable -Neg 6L   CKD stage IV -Baseline creatinine 2.0-2.2 -Monitor with diuretics   Chest discomfort -Mostly atypical by history -Primarily due to GI and musculoskeletal etiology -Certainly, her PE may have contributed -Reproducible on palpation -troponin 10>>9 -6/27 Echo EF 60-65%, no wMA, G1DD, normal RVF   Morbid obesity -BMI 47.57 -Lifestyle modification   Physical deconditioning -At baseline, the patient is able to bear weight and make a transfer with assistance   Opioid dependence/chronic pain syndrome -PDMP reviewed -Norco 5/325, #120, last refill 04/13/2023 -Continue gabapentin   Lower extremity edema -Multifactorial including chronic prednisone, progression of CKD, lymphedema -Venous duplex negative for DVT -07/20/2021 echo EF 65 to 70%, no WMA, normal RV function   Polymyalgia rheumatica/rheumatoid arthritis -Patient is on chronic prednisone 10 mg daily -Previously on Kevzara and Arava, but stopped secondary to her frequent hospitalizations and infections in the past -Not a candidate for further Biologics per last visit with rheumatology   Mixed hyperlipidemia -Continue statin   Abdominal pain -6/29CT abd/pelvis--rectus sheath hematoma -UA--neg for pyuria                 Family Communication:   son updated 7/1   Consultants:  none   Code Status:  FULL    DVT Prophylaxis:  IV Heparin      Procedures: As Listed in Progress Note Above   Antibiotics: None         Subjective: Pt complains of abdominal pain.  States abd pain is slowly  improving.  Denies f/c, cp, n/v/d, sob  Objective: Vitals:   04/23/23 0900 04/23/23 1000 04/23/23 1140 04/23/23 1616  BP: (!) 129/109 (!) 125/50    Pulse: (!) 104 97    Resp:      Temp:   98.6 F (37 C) 98.6 F (37 C)  TempSrc:   Oral Oral  SpO2: 98% 97%    Weight:      Height:        Intake/Output Summary (Last 24 hours) at 04/23/2023 1750 Last data filed at 04/23/2023 1744 Gross per 24 hour  Intake 360 ml  Output 1350 ml  Net -990 ml   Weight change:  Exam:  General:  Pt is alert, follows commands appropriately, not in acute distress HEENT: No icterus, No thrush, No neck mass, [place]/AT Cardiovascular: RRR, S1/S2, no rubs, no gallops Respiratory: bibasilar crackles. No wheeze Abdomen: Soft/+BS, non tender, non distended, no guarding Extremities: trace LE edema, No lymphangitis, No petechiae, No rashes, no synovitis   Data Reviewed: I have personally reviewed following labs and imaging studies Basic Metabolic Panel: Recent Labs  Lab 04/19/23 0303 04/20/23 0410 04/21/23 0427 04/22/23 0624 04/23/23 0616  NA 138 137 136 132* 132*  K 3.7 3.6 3.5 3.5 4.2  CL 101 98 96* 93* 93*  CO2 27 28 28 28 28   GLUCOSE 88 95 90 89 105*  BUN 50* 45* 36* 37* 39*  CREATININE 2.02* 1.94* 1.84* 1.87* 1.95*  CALCIUM 8.9 8.9 9.3 9.1 8.9  MG  --  2.1 1.9 1.9 2.3  Liver Function Tests: Recent Labs  Lab 04/20/23 0410  AST 20  ALT 22  ALKPHOS 58  BILITOT 0.5  PROT 6.4*  ALBUMIN 3.0*   No results for input(s): "LIPASE", "AMYLASE" in the last 168 hours. No results for input(s): "AMMONIA" in the last 168 hours. Coagulation Profile: No results for input(s): "INR", "PROTIME" in the last 168 hours. CBC: Recent Labs  Lab 04/18/23 1235 04/19/23 0303 04/20/23 0410 04/21/23 0427 04/21/23 1845 04/22/23 1141 04/22/23 1824 04/22/23 2339 04/23/23 0616 04/23/23 1235  WBC 9.5 8.1 8.8 9.5  --   --   --   --   --   --   HGB 10.3* 10.1* 10.3* 10.7*   < > 10.5* 9.8* 9.6* 10.1* 9.1*  HCT  33.0* 31.8* 32.8* 33.8*   < > 32.7* 30.8* 30.3* 32.3* 29.0*  MCV 94.3 93.0 94.5 93.9  --   --   --   --   --   --   PLT 260 223 252 262  --   --   --   --   --   --    < > = values in this interval not displayed.   Cardiac Enzymes: No results for input(s): "CKTOTAL", "CKMB", "CKMBINDEX", "TROPONINI" in the last 168 hours. BNP: Invalid input(s): "POCBNP" CBG: No results for input(s): "GLUCAP" in the last 168 hours. HbA1C: No results for input(s): "HGBA1C" in the last 72 hours. Urine analysis:    Component Value Date/Time   COLORURINE STRAW (A) 04/21/2023 1212   APPEARANCEUR CLEAR 04/21/2023 1212   LABSPEC 1.005 04/21/2023 1212   PHURINE 7.0 04/21/2023 1212   GLUCOSEU NEGATIVE 04/21/2023 1212   HGBUR NEGATIVE 04/21/2023 1212   BILIRUBINUR NEGATIVE 04/21/2023 1212   BILIRUBINUR negative 02/14/2020 1328   KETONESUR NEGATIVE 04/21/2023 1212   PROTEINUR NEGATIVE 04/21/2023 1212   UROBILINOGEN 0.2 02/14/2020 1328   UROBILINOGEN 0.2 02/20/2014 1116   NITRITE NEGATIVE 04/21/2023 1212   LEUKOCYTESUR NEGATIVE 04/21/2023 1212   Sepsis Labs: @LABRCNTIP (procalcitonin:4,lacticidven:4) ) Recent Results (from the past 240 hour(s))  MRSA Next Gen by PCR, Nasal     Status: None   Collection Time: 04/21/23  6:29 PM   Specimen: Nasal Mucosa; Nasal Swab  Result Value Ref Range Status   MRSA by PCR Next Gen NOT DETECTED NOT DETECTED Final    Comment: (NOTE) The GeneXpert MRSA Assay (FDA approved for NASAL specimens only), is one component of a comprehensive MRSA colonization surveillance program. It is not intended to diagnose MRSA infection nor to guide or monitor treatment for MRSA infections. Test performance is not FDA approved in patients less than 42 years old. Performed at [facility], [address] [place], [place] [address]      Scheduled Meds:  atorvastatin  20 mg Oral q morning   Chlorhexidine Gluconate Cloth  6 each Topical Daily   gabapentin  300 mg Oral TID   predniSONE   10 mg Oral Q breakfast   Continuous Infusions:  Procedures/Studies: CT ABDOMEN PELVIS WO CONTRAST  Result Date: 04/21/2023 CLINICAL DATA:  Abdominal pain EXAM: CT ABDOMEN AND PELVIS WITHOUT CONTRAST TECHNIQUE: Multidetector CT imaging of the abdomen and pelvis was performed following the standard protocol without IV contrast. RADIATION DOSE REDUCTION: This exam was performed according to the departmental dose-optimization program which includes automated exposure control, adjustment of the mA and/or kV according to patient size and/or use of iterative reconstruction technique. COMPARISON:  12/16/2021 FINDINGS: Lower chest: No acute abnormality. Coronary artery calcifications. Scarring or of  the bilateral lung bases. Hepatobiliary: No focal liver abnormality is seen. Status post cholecystectomy. No biliary dilatation. Pancreas: Unremarkable. No pancreatic ductal dilatation or surrounding inflammatory changes. Spleen: Normal in size without significant abnormality. Adrenals/Urinary Tract: Adrenal glands are unremarkable. Kidneys are normal, without renal calculi, solid lesion, or hydronephrosis. Bladder is unremarkable. Stomach/Bowel: Stomach is within normal limits. Appendix appears normal. No evidence of bowel wall thickening, distention, or inflammatory changes. Descending and sigmoid diverticulosis. Vascular/Lymphatic: Aortic atherosclerosis. No enlarged abdominal or pelvic lymph nodes. Reproductive: Exophytic calcified fibroid arising from the left aspect of the uterine fundus. Other: No abdominal wall hernia. Layering rectus sheath hematoma along the inferior aspect of the right rectus musculature measuring 8.4 x 8.4 x 4.6 cm (series 2, image 50, series 6, image 74). No ascites. Musculoskeletal: No acute or significant osseous findings. IMPRESSION: 1. Layering rectus sheath hematoma along the inferior aspect of the right rectus musculature measuring 8.4 x 8.4 x 4.6 cm. 2. Descending and sigmoid  diverticulosis without evidence of acute diverticulitis. 3. Coronary artery disease. Aortic Atherosclerosis (ICD10-I70.0). Electronically Signed   By: Jearld Lesch M.D.   On: 04/21/2023 16:41   ECHOCARDIOGRAM COMPLETE  Result Date: 04/19/2023    ECHOCARDIOGRAM REPORT   Patient Name:   BERENIZ TOCCI Date of Exam: 04/19/2023 Medical Rec #:  [id number]      Height:       61.0 in Accession #:    [id number]     Weight:       251.8 lb Date of Birth:  05-30-1946       BSA:          2.083 m Patient Age:    76 years       BP:           111/59 mmHg Patient Gender: F              HR:           93 bpm. [facility] Procedure: 2D Echo, Cardiac Doppler and Color Doppler Indications:    Congestive Heart Failure I50.9  History:        Patient has prior history of Echocardiogram examinations, most                 recent 07/20/2021. CHF; Risk Factors:Hypertension and                 Dyslipidemia.  Sonographer:    Celesta Gentile RCS Referring Phys: [id number] Heloise Beecham [facility]  Sonographer Comments: Technically difficult study due to poor echo windows and patient is obese. IMPRESSIONS  1. Left ventricular ejection fraction, by estimation, is 60 to 65%. The left ventricle has normal function. The left ventricle has no regional wall motion abnormalities. There is mild left ventricular hypertrophy. Left ventricular diastolic parameters are consistent with Grade I diastolic dysfunction (impaired relaxation).  2. Right ventricular systolic function is normal. The right ventricular size is mildly enlarged. Tricuspid regurgitation signal is inadequate for assessing PA pressure.  3. The mitral valve was not well visualized. No evidence of mitral valve regurgitation. No evidence of mitral stenosis.  4. The aortic valve was not well visualized. Aortic valve regurgitation is not visualized. No aortic stenosis is present.  5. The inferior vena cava is dilated in size with >50% respiratory variability, suggesting right atrial pressure  of 8 mmHg. FINDINGS  Left Ventricle: Left ventricular ejection fraction, by estimation, is 60 to 65%. The left ventricle has normal function. The left ventricle has  no regional wall motion abnormalities. Definity contrast agent was given IV to delineate the left ventricular  endocardial borders. The left ventricular internal cavity size was normal in size. There is mild left ventricular hypertrophy. Left ventricular diastolic parameters are consistent with Grade I diastolic dysfunction (impaired relaxation). Right Ventricle: The right ventricular size is mildly enlarged. No increase in right ventricular wall thickness. Right ventricular systolic function is normal. Tricuspid regurgitation signal is inadequate for assessing PA pressure. Left Atrium: Left atrial size was normal in size. Right Atrium: Right atrial size was normal in size. Pericardium: Trivial pericardial effusion is present. Mitral Valve: The mitral valve was not well visualized. No evidence of mitral valve regurgitation. No evidence of mitral valve stenosis. Tricuspid Valve: The tricuspid valve is normal in structure. Tricuspid valve regurgitation is not demonstrated. No evidence of tricuspid stenosis. Aortic Valve: The aortic valve was not well visualized. Aortic valve regurgitation is not visualized. No aortic stenosis is present. Aortic valve mean gradient measures 8.0 mmHg. Aortic valve peak gradient measures 13.2 mmHg. Aortic valve area, by VTI measures 1.32 cm. Pulmonic Valve: The pulmonic valve was not well visualized. Pulmonic valve regurgitation is trivial. No evidence of pulmonic stenosis. Aorta: The aortic root is normal in size and structure. Venous: The inferior vena cava is dilated in size with greater than 50% respiratory variability, suggesting right atrial pressure of 8 mmHg. IAS/Shunts: The interatrial septum was not well visualized.  LEFT VENTRICLE PLAX 2D LVIDd:         3.70 cm   Diastology LVIDs:         1.90 cm   LV e' lateral:    8.05 cm/s LV PW:         1.40 cm   LV E/e' lateral: 9.8 LV IVS:        1.30 cm LVOT diam:     1.70 cm LV SV:         53 LV SV Index:   26 LVOT Area:     2.27 cm  LEFT ATRIUM             Index LA diam:        3.30 cm 1.58 cm/m LA Vol (A2C):   29.8 ml 14.31 ml/m LA Vol (A4C):   29.1 ml 13.97 ml/m LA Biplane Vol: 29.6 ml 14.21 ml/m  AORTIC VALVE AV Area (Vmax):    1.30 cm AV Area (Vmean):   1.37 cm AV Area (VTI):     1.32 cm AV Vmax:           182.00 cm/s AV Vmean:          135.000 cm/s AV VTI:            0.401 m AV Peak Grad:      13.2 mmHg AV Mean Grad:      8.0 mmHg LVOT Vmax:         104.00 cm/s LVOT Vmean:        81.600 cm/s LVOT VTI:          0.234 m LVOT/AV VTI ratio: 0.58  AORTA Ao Root diam: 3.70 cm MITRAL VALVE MV Area (PHT): 2.95 cm     SHUNTS MV Decel Time: 257 msec     Systemic VTI:  0.23 m MV E velocity: 79.00 cm/s   Systemic Diam: 1.70 cm MV A velocity: 136.00 cm/s MV E/A ratio:  0.58 Vishnu Priya Mallipeddi Electronically signed by Winfield Rast Mallipeddi Signature Date/Time: 04/19/2023/12:18:16 PM  Final    NM Pulmonary Perfusion  Result Date: 04/18/2023 CLINICAL DATA:  Pulmonary embolism (PE) suspected, low to intermediate prob, positive D-dimer EXAM: NUCLEAR MEDICINE PERFUSION LUNG SCAN TECHNIQUE: Perfusion images were obtained in multiple projections after intravenous injection of radiopharmaceutical. Ventilation scans intentionally deferred if perfusion scan and chest x-ray adequate for interpretation during COVID 19 epidemic. RADIOPHARMACEUTICALS:  4.4 mCi Tc-45m MAA IV COMPARISON:  04/18/2023 FINDINGS: Planar images of the lungs are obtained in multiple projections during the perfusion phase of the exam. There is a single wedge-shaped subsegmental perfusion defect within the lateral aspect of the superior segment right lower lobe. No other perfusion defects. IMPRESSION: 1. Single wedge-shaped perfusion defect within the lateral aspect superior segment right lower lobe, compatible  with pulmonary embolus based on PISAPED criteria. Critical Value/emergent results were called by telephone at the time of interpretation on 04/18/2023 at 5:27pm to provider Dr. Charm Barges, who verbally acknowledged these results. Electronically Signed   By: Sharlet Salina M.D.   On: 04/18/2023 17:42   US Venous Img Lower Bilateral (DVT)  Result Date: 04/18/2023 CLINICAL DATA:  Bilateral edema, worsening right.  Pain EXAM: BILATERAL LOWER EXTREMITY VENOUS DOPPLER ULTRASOUND TECHNIQUE: Gray-scale sonography with compression, as well as color and duplex ultrasound, were performed to evaluate the deep venous system(s) from the level of the common femoral vein through the popliteal and proximal calf veins. COMPARISON:  08/29/2007 FINDINGS: VENOUS Normal compressibility of the common femoral, superficial femoral, and popliteal veins, as well as the visualized calf veins. Visualized portions of profunda femoral vein and great saphenous vein unremarkable. No filling defects to suggest DVT on grayscale or color Doppler imaging. Doppler waveforms show normal direction of venous flow, normal respiratory plasticity and response to augmentation. OTHER Marked subcutaneous edema in the distal left calf. Limitations: Technologist describes technically difficult study secondary to body habitus, pitting edema, and patient pain intolerance limiting calf vein evaluation. IMPRESSION: No femoropopliteal DVT . If clinical symptoms are inconsistent or if there are persistent or worsening symptoms, further imaging (possibly involving the iliac veins) may be warranted. Electronically Signed   By: Corlis Leak M.D.   On: 04/18/2023 15:09   DG Chest 2 View  Result Date: 04/18/2023 CLINICAL DATA:  Chest pain EXAM: CHEST - 2 VIEW COMPARISON:  03/27/2019 FINDINGS: Underinflation. Bronchovascular crowding. Borderline cardiopericardial silhouette with a calcified aorta. Left midlung scar or atelectasis is once again identified. No pneumothorax or  effusion. Overlapping cardiac leads. Degenerative changes of the shoulders and spine. Under penetrated radiograph IMPRESSION: Underinflation with bronchovascular crowding. Stable left midlung scar or atelectasis. Electronically Signed   By: Karen Kays M.D.   On: 04/18/2023 13:06   DG Chest 2 View  Result Date: 04/01/2023 CLINICAL DATA:  Chest pain. EXAM: CHEST - 2 VIEW COMPARISON:  Chest radiograph 12/25/2021 FINDINGS: Cardiomegaly. Aortic tortuosity. Bandlike opacity left mid lung. Low lung volumes. No pleural effusion or pneumothorax. Extensive bilateral shoulder joint degenerative changes. IMPRESSION: Bandlike opacity left mid lung favored to represent atelectasis. Electronically Signed   By: Annia Belt M.D.   On: 04/01/2023 21:25    Catarina Hartshorn, DO  [facility]  If 7PM-7AM, please contact night-coverage [email or web address] Password [email or web address] 04/23/2023, 5:50 PM   LOS: 4 days

## 2023-04-23 NOTE — Progress Notes (Signed)
PT Cancellation Note  Patient Details Name: Nicole [name] MRN: [id number] DOB: April 10, 1946   Cancelled Treatment:    Reason Eval/Treat Not Completed: Medical issues which prohibited therapy.  Patient transferred to a higher level of care and will need new PT consult to resume therapy when patient is medically stable.  Thank you.   7:49 AM, 04/23/23 Ocie Bob, MPT Physical Therapist with [facility] [phone] office [phone] mobile phone

## 2023-04-24 DIAGNOSIS — I2699 Other pulmonary embolism without acute cor pulmonale: Secondary | ICD-10-CM | POA: Diagnosis not present

## 2023-04-24 DIAGNOSIS — I5033 Acute on chronic diastolic (congestive) heart failure: Secondary | ICD-10-CM | POA: Diagnosis not present

## 2023-04-24 DIAGNOSIS — N184 Chronic kidney disease, stage 4 (severe): Secondary | ICD-10-CM | POA: Diagnosis not present

## 2023-04-24 DIAGNOSIS — S301XXD Contusion of abdominal wall, subsequent encounter: Secondary | ICD-10-CM | POA: Diagnosis not present

## 2023-04-24 DIAGNOSIS — I2609 Other pulmonary embolism with acute cor pulmonale: Secondary | ICD-10-CM | POA: Diagnosis not present

## 2023-04-24 LAB — BASIC METABOLIC PANEL
Anion gap: 7 (ref 5–15)
BUN: 39 mg/dL — ABNORMAL HIGH (ref 8–23)
CO2: 31 mmol/L (ref 22–32)
Calcium: 8.9 mg/dL (ref 8.9–10.3)
Chloride: 95 mmol/L — ABNORMAL LOW (ref 98–111)
Creatinine, Ser: 1.7 mg/dL — ABNORMAL HIGH (ref 0.44–1.00)
GFR, Estimated: 31 mL/min — ABNORMAL LOW (ref >60)
Glucose, Bld: 102 mg/dL — ABNORMAL HIGH (ref 70–99)
Potassium: 4.3 mmol/L (ref 3.5–5.1)
Sodium: 133 mmol/L — ABNORMAL LOW (ref 135–145)

## 2023-04-24 LAB — CBC
HCT: 28.3 % — ABNORMAL LOW (ref 36.0–46.0)
Hemoglobin: 8.9 g/dL — ABNORMAL LOW (ref 12.0–15.0)
MCH: 30.2 pg (ref 26.0–34.0)
MCHC: 31.4 g/dL (ref 30.0–36.0)
MCV: 95.9 fL (ref 80.0–100.0)
Platelets: 240 10*3/uL (ref 150–400)
RBC: 2.95 MIL/uL — ABNORMAL LOW (ref 3.87–5.11)
RDW: 16.5 % — ABNORMAL HIGH (ref 11.5–15.5)
WBC: 9.8 10*3/uL (ref 4.0–10.5)
nRBC: 0 % (ref 0.0–0.2)

## 2023-04-24 LAB — MAGNESIUM: Magnesium: 2.6 mg/dL — ABNORMAL HIGH (ref 1.7–2.4)

## 2023-04-24 MED ORDER — ALPRAZOLAM 0.25 MG PO TABS
0.2500 mg | ORAL_TABLET | Freq: Once | ORAL | Status: AC
  Administered 2023-04-24: 0.25 mg via ORAL
  Filled 2023-04-24: qty 1

## 2023-04-24 NOTE — Progress Notes (Signed)
PROGRESS NOTE  Nicole [name]:[id number] DOB: 09-19-1946 DOA: 04/18/2023 PCP: [name], PA-C  Brief History:  77 year old female with a history of hypertension, polymyalgia rheumatica on chronic prednisone, rheumatoid arthritis on chronic prednisone, esophageal dysmotility, hyperlipidemia, secondary hyperparathyroidism, temporal arteritis and chronic lower extremity edema presenting with worsening shortness of breath and dyspnea on exertion over the past several weeks. The patient is a difficult historian.  History is supplemented from speaking with the patient's son and reviewed the medical record.  Nevertheless, it appears that the patient has been having shortness of breath and dyspnea on exertion at least for the past year, but it has worsened in the past month.  The patient has chronic lower extremity edema which has been well-documented throughout the medical record, particularly their evaluations from cardiology Eden Emms) and vascular surgery (Early).  Nevertheless, the patient feels that that her edema has been worsened over the past several weeks.  She states that she has been having some chest pressure on and off at rest as well as with movement.  Most of the time, she takes some antacids, and she has relief for several hours before it returns.  In addition she states that belching also seems to help her chest discomfort.  She denies any nausea, vomiting, diarrhea, abdominal pain, fevers, chills.  There is no hematochezia or melena.  Interestingly, she states that she saw her PCP about 3 weeks prior to this admission because of her edema and shortness of breath.  She states that her PCP increased her diuretic to 3 times daily from once daily.  However review of the medical record shows that she had been on torsemide 40 mg twice daily in the past.  In the ED, the patient was afebrile and hemodynamically stable with oxygen saturation 96% room air.  WBC 9.5, hemoglobin 10.3,  platelets 250,000.Sodium 135, potassium 5.2, bicarbonate 26, serum creatinine 2.24.  The patient was started on IV furosemide and given Lokelma.  VQ scan showed a single wedge-shaped perfusion defect in the lateral aspect of the superior right lower lobe consistent with pulmonary embolus.  The patient was started on IV heparin. She was subsequently transitioned to po apixaban.  Unfortunately, she developed a spontaneous rectus sheath hematoma.  Her anticoagulation was stopped and risks/benefits/alternatives were discussed with pt and family.  She was monitored off anticoagulation and she remained clinically stable.  Hematology was consulted for opinion.   Assessment/Plan: Acute pulmonary embolus -Stable on room air presently -04/18/2023 VQ scan--single which showed perfusion defect in the lateral aspect of superior RLL c/w PE -Continue IV heparin>>transitioned to po apixaban 6/29 -6/27 Echo EF 60-65%, no wMA, G1DD, normal RVF -04/21/23 anticoagulation stopped due to spontaneous rectus sheath hematoma   Rectus Sheath Hematoma --6/29 CT abd--Layering rectus sheath hematoma along the inferior aspect of the right rectus musculature measuring 8.4 x 8.4 x 4.6 cm --spontaneous bleed, no trauma --discussed with general surgery--Dr. Jenkins>>non-operative management --discussed with IR--Dr. Mugweru>>no immediate indication for embolization presently.  Recommended stopping anticoagulation and monitor clinically.  If pt becomes clinically unstable, then order CTA of abdomen and pelvis STAT to rule out any active bleeding;  if CTA is positive, then reconsult IR --discussed with pharmacist regarding KCentra.  As pt currently has acute PE, and K Werner Lean is prothrombotic, there is possibility of causing more clots.  Recommending holding KCentra as pt is clinically stable.   -04/24/23--discussed with hematology--Dr. Ancil Linsey IVC filter for now; possible restart  AC once hematoma is stable/smaller -plan repeat  CT abd 7/3 -also discussed with Dr. Dollene Primrose CTA chest if son agreeable to clarify true PE and clot burden   Acute on chronic HFpEF -6/27 echo--EF 60-65%, no WMA, G1DD, normal RVF -also partly due to progression of her underlying CKD as well as lymphedema -6/27 Echo EF 60-65%, no wMA, G1DD, normal RVF -Urine protein creatinine ratio--no significant proteinuria -11/30/2022 office weight 242 pounds -6/26 admission weight 254 lbs -continue lasix IV 40 mg bid>>back to po torsemide when stable -Neg 6L   CKD stage IV -Baseline creatinine 2.0-2.2 -renal function now better than baseline   Chest discomfort -Mostly atypical by history -Primarily due to GI and musculoskeletal etiology -Certainly, her PE may have contributed -Reproducible on palpation -troponin 10>>9 -6/27 Echo EF 60-65%, no wMA, G1DD, normal RVF   Morbid obesity -BMI 47.57 -Lifestyle modification   Physical deconditioning -At baseline, the patient is able to bear weight and make a transfer with assistance   Opioid dependence/chronic pain syndrome -PDMP reviewed -Norco 5/325, #120, last refill 04/13/2023 -Continue gabapentin   Lower extremity edema -Multifactorial including chronic prednisone, progression of CKD, lymphedema -Venous duplex negative for DVT -07/20/2021 echo EF 65 to 70%, no WMA, normal RV function   Polymyalgia rheumatica/rheumatoid arthritis -Patient is on chronic prednisone 10 mg daily -Previously on Kevzara and Arava, but stopped secondary to her frequent hospitalizations and infections in the past -Not a candidate for further Biologics per last visit with rheumatology   Mixed hyperlipidemia -Continue statin   Abdominal pain -6/29CT abd/pelvis--rectus sheath hematoma -UA--neg for pyuria                 Family Communication:   son updated 7/2   Consultants:  none   Code Status:  FULL    DVT Prophylaxis:  IV Heparin      Procedures: As Listed in Progress Note Above    Antibiotics: None            Subjective: Pt complains of leg pain. Denies f/c cp, n/v/d, sob  Objective: Vitals:   04/24/23 0900 04/24/23 1000 04/24/23 1100 04/24/23 1500  BP:   (!) 140/72   Pulse: 94 79 85   Resp: (!) 24 (!) 21 14   Temp:    98.6 F (37 C)  TempSrc:    Axillary  SpO2: 99% 99% 99%   Weight:      Height:        Intake/Output Summary (Last 24 hours) at 04/24/2023 1805 Last data filed at 04/23/2023 1925 Gross per 24 hour  Intake --  Output 200 ml  Net -200 ml   Weight change: 1.2 kg Exam:  General:  Pt is alert, follows commands appropriately, not in acute distress HEENT: No icterus, No thrush, No neck mass, [place]/AT Cardiovascular: RRR, S1/S2, no rubs, no gallops Respiratory: bibasilar crackles.  No wheeze Abdomen: Soft/+BS, non tender, non distended, no guarding Extremities: 1 + LE edema, No lymphangitis, No petechiae, No rashes, no synovitis   Data Reviewed: I have personally reviewed following labs and imaging studies Basic Metabolic Panel: Recent Labs  Lab 04/20/23 0410 04/21/23 0427 04/22/23 0624 04/23/23 0616 04/24/23 0507  NA 137 136 132* 132* 133*  K 3.6 3.5 3.5 4.2 4.3  CL 98 96* 93* 93* 95*  CO2 28 28 28 28 31   GLUCOSE 95 90 89 105* 102*  BUN 45* 36* 37* 39* 39*  CREATININE 1.94* 1.84* 1.87* 1.95* 1.70*  CALCIUM 8.9 9.3 9.1 8.9  8.9  MG 2.1 1.9 1.9 2.3 2.6*   Liver Function Tests: Recent Labs  Lab 04/20/23 0410  AST 20  ALT 22  ALKPHOS 58  BILITOT 0.5  PROT 6.4*  ALBUMIN 3.0*   No results for input(s): "LIPASE", "AMYLASE" in the last 168 hours. No results for input(s): "AMMONIA" in the last 168 hours. Coagulation Profile: No results for input(s): "INR", "PROTIME" in the last 168 hours. CBC: Recent Labs  Lab 04/18/23 1235 04/19/23 0303 04/20/23 0410 04/21/23 0427 04/21/23 1845 04/22/23 1824 04/22/23 2339 04/23/23 0616 04/23/23 1235 04/24/23 0507  WBC 9.5 8.1 8.8 9.5  --   --   --   --   --  9.8  HGB  10.3* 10.1* 10.3* 10.7*   < > 9.8* 9.6* 10.1* 9.1* 8.9*  HCT 33.0* 31.8* 32.8* 33.8*   < > 30.8* 30.3* 32.3* 29.0* 28.3*  MCV 94.3 93.0 94.5 93.9  --   --   --   --   --  95.9  PLT 260 223 252 262  --   --   --   --   --  240   < > = values in this interval not displayed.   Cardiac Enzymes: No results for input(s): "CKTOTAL", "CKMB", "CKMBINDEX", "TROPONINI" in the last 168 hours. BNP: Invalid input(s): "POCBNP" CBG: No results for input(s): "GLUCAP" in the last 168 hours. HbA1C: No results for input(s): "HGBA1C" in the last 72 hours. Urine analysis:    Component Value Date/Time   COLORURINE STRAW (A) 04/21/2023 1212   APPEARANCEUR CLEAR 04/21/2023 1212   LABSPEC 1.005 04/21/2023 1212   PHURINE 7.0 04/21/2023 1212   GLUCOSEU NEGATIVE 04/21/2023 1212   HGBUR NEGATIVE 04/21/2023 1212   BILIRUBINUR NEGATIVE 04/21/2023 1212   BILIRUBINUR negative 02/14/2020 1328   KETONESUR NEGATIVE 04/21/2023 1212   PROTEINUR NEGATIVE 04/21/2023 1212   UROBILINOGEN 0.2 02/14/2020 1328   UROBILINOGEN 0.2 02/20/2014 1116   NITRITE NEGATIVE 04/21/2023 1212   LEUKOCYTESUR NEGATIVE 04/21/2023 1212   Sepsis Labs: @LABRCNTIP (procalcitonin:4,lacticidven:4) ) Recent Results (from the past 240 hour(s))  MRSA Next Gen by PCR, Nasal     Status: None   Collection Time: 04/21/23  6:29 PM   Specimen: Nasal Mucosa; Nasal Swab  Result Value Ref Range Status   MRSA by PCR Next Gen NOT DETECTED NOT DETECTED Final    Comment: (NOTE) The GeneXpert MRSA Assay (FDA approved for NASAL specimens only), is one component of a comprehensive MRSA colonization surveillance program. It is not intended to diagnose MRSA infection nor to guide or monitor treatment for MRSA infections. Test performance is not FDA approved in patients less than 34 years old. Performed at [facility], [address], [place], [place] [address]      Scheduled Meds:  atorvastatin  20 mg Oral q morning   Chlorhexidine Gluconate Cloth  6  each Topical Daily   gabapentin  300 mg Oral TID   predniSONE  10 mg Oral Q breakfast   Continuous Infusions:  Procedures/Studies: CT ABDOMEN PELVIS WO CONTRAST  Result Date: 04/21/2023 CLINICAL DATA:  Abdominal pain EXAM: CT ABDOMEN AND PELVIS WITHOUT CONTRAST TECHNIQUE: Multidetector CT imaging of the abdomen and pelvis was performed following the standard protocol without IV contrast. RADIATION DOSE REDUCTION: This exam was performed according to the departmental dose-optimization program which includes automated exposure control, adjustment of the mA and/or kV according to patient size and/or use of iterative reconstruction technique. COMPARISON:  12/16/2021 FINDINGS: Lower chest: No acute abnormality. Coronary artery  calcifications. Scarring or of the bilateral lung bases. Hepatobiliary: No focal liver abnormality is seen. Status post cholecystectomy. No biliary dilatation. Pancreas: Unremarkable. No pancreatic ductal dilatation or surrounding inflammatory changes. Spleen: Normal in size without significant abnormality. Adrenals/Urinary Tract: Adrenal glands are unremarkable. Kidneys are normal, without renal calculi, solid lesion, or hydronephrosis. Bladder is unremarkable. Stomach/Bowel: Stomach is within normal limits. Appendix appears normal. No evidence of bowel wall thickening, distention, or inflammatory changes. Descending and sigmoid diverticulosis. Vascular/Lymphatic: Aortic atherosclerosis. No enlarged abdominal or pelvic lymph nodes. Reproductive: Exophytic calcified fibroid arising from the left aspect of the uterine fundus. Other: No abdominal wall hernia. Layering rectus sheath hematoma along the inferior aspect of the right rectus musculature measuring 8.4 x 8.4 x 4.6 cm (series 2, image 50, series 6, image 74). No ascites. Musculoskeletal: No acute or significant osseous findings. IMPRESSION: 1. Layering rectus sheath hematoma along the inferior aspect of the right rectus musculature  measuring 8.4 x 8.4 x 4.6 cm. 2. Descending and sigmoid diverticulosis without evidence of acute diverticulitis. 3. Coronary artery disease. Aortic Atherosclerosis (ICD10-I70.0). Electronically Signed   By: Jearld Lesch M.D.   On: 04/21/2023 16:41   ECHOCARDIOGRAM COMPLETE  Result Date: 04/19/2023    ECHOCARDIOGRAM REPORT   Patient Name:   Nicole [name] Date of Exam: 04/19/2023 Medical Rec #:  [id number]      Height:       61.0 in Accession #:    [id number]     Weight:       251.8 lb Date of Birth:  1946-08-24       BSA:          2.083 m Patient Age:    76 years       BP:           111/59 mmHg Patient Gender: F              HR:           93 bpm. [facility] Procedure: 2D Echo, Cardiac Doppler and Color Doppler Indications:    Congestive Heart Failure I50.9  History:        Patient has prior history of Echocardiogram examinations, most                 recent 07/20/2021. CHF; Risk Factors:Hypertension and                 Dyslipidemia.  Sonographer:    Celesta Gentile RCS Referring Phys: [phone] Heloise Beecham [facility]  Sonographer Comments: Technically difficult study due to poor echo windows and patient is obese. IMPRESSIONS  1. Left ventricular ejection fraction, by estimation, is 60 to 65%. The left ventricle has normal function. The left ventricle has no regional wall motion abnormalities. There is mild left ventricular hypertrophy. Left ventricular diastolic parameters are consistent with Grade I diastolic dysfunction (impaired relaxation).  2. Right ventricular systolic function is normal. The right ventricular size is mildly enlarged. Tricuspid regurgitation signal is inadequate for assessing PA pressure.  3. The mitral valve was not well visualized. No evidence of mitral valve regurgitation. No evidence of mitral stenosis.  4. The aortic valve was not well visualized. Aortic valve regurgitation is not visualized. No aortic stenosis is present.  5. The inferior vena cava is dilated in size with >50%  respiratory variability, suggesting right atrial pressure of 8 mmHg. FINDINGS  Left Ventricle: Left ventricular ejection fraction, by estimation, is 60 to 65%. The left ventricle has normal function.  The left ventricle has no regional wall motion abnormalities. Definity contrast agent was given IV to delineate the left ventricular  endocardial borders. The left ventricular internal cavity size was normal in size. There is mild left ventricular hypertrophy. Left ventricular diastolic parameters are consistent with Grade I diastolic dysfunction (impaired relaxation). Right Ventricle: The right ventricular size is mildly enlarged. No increase in right ventricular wall thickness. Right ventricular systolic function is normal. Tricuspid regurgitation signal is inadequate for assessing PA pressure. Left Atrium: Left atrial size was normal in size. Right Atrium: Right atrial size was normal in size. Pericardium: Trivial pericardial effusion is present. Mitral Valve: The mitral valve was not well visualized. No evidence of mitral valve regurgitation. No evidence of mitral valve stenosis. Tricuspid Valve: The tricuspid valve is normal in structure. Tricuspid valve regurgitation is not demonstrated. No evidence of tricuspid stenosis. Aortic Valve: The aortic valve was not well visualized. Aortic valve regurgitation is not visualized. No aortic stenosis is present. Aortic valve mean gradient measures 8.0 mmHg. Aortic valve peak gradient measures 13.2 mmHg. Aortic valve area, by VTI measures 1.32 cm. Pulmonic Valve: The pulmonic valve was not well visualized. Pulmonic valve regurgitation is trivial. No evidence of pulmonic stenosis. Aorta: The aortic root is normal in size and structure. Venous: The inferior vena cava is dilated in size with greater than 50% respiratory variability, suggesting right atrial pressure of 8 mmHg. IAS/Shunts: The interatrial septum was not well visualized.  LEFT VENTRICLE PLAX 2D LVIDd:          3.70 cm   Diastology LVIDs:         1.90 cm   LV e' lateral:   8.05 cm/s LV PW:         1.40 cm   LV E/e' lateral: 9.8 LV IVS:        1.30 cm LVOT diam:     1.70 cm LV SV:         53 LV SV Index:   26 LVOT Area:     2.27 cm  LEFT ATRIUM             Index LA diam:        3.30 cm 1.58 cm/m LA Vol (A2C):   29.8 ml 14.31 ml/m LA Vol (A4C):   29.1 ml 13.97 ml/m LA Biplane Vol: 29.6 ml 14.21 ml/m  AORTIC VALVE AV Area (Vmax):    1.30 cm AV Area (Vmean):   1.37 cm AV Area (VTI):     1.32 cm AV Vmax:           182.00 cm/s AV Vmean:          135.000 cm/s AV VTI:            0.401 m AV Peak Grad:      13.2 mmHg AV Mean Grad:      8.0 mmHg LVOT Vmax:         104.00 cm/s LVOT Vmean:        81.600 cm/s LVOT VTI:          0.234 m LVOT/AV VTI ratio: 0.58  AORTA Ao Root diam: 3.70 cm MITRAL VALVE MV Area (PHT): 2.95 cm     SHUNTS MV Decel Time: 257 msec     Systemic VTI:  0.23 m MV E velocity: 79.00 cm/s   Systemic Diam: 1.70 cm MV A velocity: 136.00 cm/s MV E/A ratio:  0.58 Vishnu Priya Mallipeddi Electronically signed by Winfield Rast Mallipeddi Signature Date/Time: 04/19/2023/12:18:16  PM    Final    NM Pulmonary Perfusion  Result Date: 04/18/2023 CLINICAL DATA:  Pulmonary embolism (PE) suspected, low to intermediate prob, positive D-dimer EXAM: NUCLEAR MEDICINE PERFUSION LUNG SCAN TECHNIQUE: Perfusion images were obtained in multiple projections after intravenous injection of radiopharmaceutical. Ventilation scans intentionally deferred if perfusion scan and chest x-ray adequate for interpretation during COVID 19 epidemic. RADIOPHARMACEUTICALS:  4.4 mCi Tc-47m MAA IV COMPARISON:  04/18/2023 FINDINGS: Planar images of the lungs are obtained in multiple projections during the perfusion phase of the exam. There is a single wedge-shaped subsegmental perfusion defect within the lateral aspect of the superior segment right lower lobe. No other perfusion defects. IMPRESSION: 1. Single wedge-shaped perfusion defect within the  lateral aspect superior segment right lower lobe, compatible with pulmonary embolus based on PISAPED criteria. Critical Value/emergent results were called by telephone at the time of interpretation on 04/18/2023 at 5:27pm to provider Dr. Charm Barges, who verbally acknowledged these results. Electronically Signed   By: Sharlet Salina M.D.   On: 04/18/2023 17:42   US Venous Img Lower Bilateral (DVT)  Result Date: 04/18/2023 CLINICAL DATA:  Bilateral edema, worsening right.  Pain EXAM: BILATERAL LOWER EXTREMITY VENOUS DOPPLER ULTRASOUND TECHNIQUE: Gray-scale sonography with compression, as well as color and duplex ultrasound, were performed to evaluate the deep venous system(s) from the level of the common femoral vein through the popliteal and proximal calf veins. COMPARISON:  08/29/2007 FINDINGS: VENOUS Normal compressibility of the common femoral, superficial femoral, and popliteal veins, as well as the visualized calf veins. Visualized portions of profunda femoral vein and great saphenous vein unremarkable. No filling defects to suggest DVT on grayscale or color Doppler imaging. Doppler waveforms show normal direction of venous flow, normal respiratory plasticity and response to augmentation. OTHER Marked subcutaneous edema in the distal left calf. Limitations: Technologist describes technically difficult study secondary to body habitus, pitting edema, and patient pain intolerance limiting calf vein evaluation. IMPRESSION: No femoropopliteal DVT . If clinical symptoms are inconsistent or if there are persistent or worsening symptoms, further imaging (possibly involving the iliac veins) may be warranted. Electronically Signed   By: Corlis Leak M.D.   On: 04/18/2023 15:09   DG Chest 2 View  Result Date: 04/18/2023 CLINICAL DATA:  Chest pain EXAM: CHEST - 2 VIEW COMPARISON:  03/27/2019 FINDINGS: Underinflation. Bronchovascular crowding. Borderline cardiopericardial silhouette with a calcified aorta. Left midlung scar  or atelectasis is once again identified. No pneumothorax or effusion. Overlapping cardiac leads. Degenerative changes of the shoulders and spine. Under penetrated radiograph IMPRESSION: Underinflation with bronchovascular crowding. Stable left midlung scar or atelectasis. Electronically Signed   By: Karen Kays M.D.   On: 04/18/2023 13:06   DG Chest 2 View  Result Date: 04/01/2023 CLINICAL DATA:  Chest pain. EXAM: CHEST - 2 VIEW COMPARISON:  Chest radiograph 12/25/2021 FINDINGS: Cardiomegaly. Aortic tortuosity. Bandlike opacity left mid lung. Low lung volumes. No pleural effusion or pneumothorax. Extensive bilateral shoulder joint degenerative changes. IMPRESSION: Bandlike opacity left mid lung favored to represent atelectasis. Electronically Signed   By: Annia Belt M.D.   On: 04/01/2023 21:25    Catarina Hartshorn, DO  [facility]  If 7PM-7AM, please contact night-coverage [email or web address] Password [email or web address] 04/24/2023, 6:05 PM   LOS: 5 days

## 2023-04-24 NOTE — Consult Note (Signed)
[facility] Consultation Oncology  Name: Nicole [name]      MRN: [id number]    Location: IC11/IC11-01  Date: 04/24/2023 Time:4:00 PM   REFERRING PHYSICIAN: Dr. Arbutus Leas  REASON FOR CONSULT: Anticoagulation in a patient with pulm embolism   DIAGNOSIS: Rectus sheath hematoma  HISTORY OF PRESENT ILLNESS: Nicole [name] is a 77 year old female seen in consultation today at the request of Dr. Arbutus Leas.  Ultrasound Doppler on 04/18/2023 did not show DVT.  Nuclear medicine perfusion scan on 04/18/2023 showed single wedge-shaped perfusion defect within the lateral aspect of the superior segment of the right lower lobe compatible with pulmonary embolism.  She was started on heparin which was transitioned to apixaban.  After a few hours of the first dose of apixaban, she developed abdominal pain.  CT AP on 04/21/2023 showed layering rectus sheath hematoma along the inferior aspect of the right rectus musculature measuring 8.4 x 8.4 x 4.6 cm.  She reports that abdominal pain has improved.  Also reports that her breathing has improved since hospitalization.  She does not report any personal or family history of malignancies.  She lives at home by herself and has aide coming in every day.  She is mostly wheelchair dependent.  PAST MEDICAL HISTORY:   Past Medical History:  Diagnosis Date   AC (acromioclavicular) joint bone spurs    lt shoulder   Acute kidney failure (HCC)    Anemia    Arthritis    Carpal tunnel syndrome, bilateral    CHF (congestive heart failure) (HCC)    Chronic kidney disease    Chronic pain syndrome    Cognitive communication deficit    Collagen vascular disease (HCC)    Dysphagia    Gastroesophageal reflux    Headache    recent visit to ER @ [facility] for severe headache   Hypertension    Lumbar stenosis    Hx of ESIs by Dr. Ethelene Hal   Major depressive disorder    Metabolic encephalopathy    Muscle weakness    Polyarthralgia    Polymyalgia (HCC)    Shingles    Spinal stenosis      ALLERGIES: Allergies  Allergen Reactions   Oxycontin [Oxycodone Hcl] Swelling    Pt tolerates hydromorphone.   Sulfa Antibiotics Other (See Comments)    Sores   Tramadol     "Makes me feel like I am falling"   Penicillins Rash    Tolerated augmentin    Sulfasalazine Other (See Comments)    Sores      MEDICATIONS: I have reviewed the patient's current medications.     PAST SURGICAL HISTORY Past Surgical History:  Procedure Laterality Date   BACK SURGERY  07/05/2018, 07/2018   x2    BIOPSY  09/22/2020   Procedure: BIOPSY;  Surgeon: Malissa Hippo, MD;  [facility];  Service: Endoscopy;;   BIOPSY  10/05/2021   Procedure: BIOPSY;  Surgeon: Dolores Frame, MD;  [facility];  Service: Gastroenterology;;   CHOLECYSTECTOMY     COLONOSCOPY  06/11/2012   Procedure: COLONOSCOPY;  Surgeon: Dalia Heading, MD;  [facility];  Service: Gastroenterology;  Laterality: N/A;   COLONOSCOPY WITH PROPOFOL N/A 09/22/2020   Procedure: COLONOSCOPY WITH PROPOFOL;  Surgeon: Malissa Hippo, MD;  [facility];  Service: Endoscopy;  Laterality: N/A;  730   ESOPHAGOGASTRODUODENOSCOPY (EGD) WITH PROPOFOL N/A 10/05/2021   Procedure: ESOPHAGOGASTRODUODENOSCOPY (EGD) WITH PROPOFOL;  Surgeon: Dolores Frame, MD;  [facility];  Service: Gastroenterology;  Laterality: N/A;   HYSTEROSCOPY WITH D & C N/A 02/25/2014   Procedure: DILATATION AND CURETTAGE /HYSTEROSCOPY;  Surgeon: Lazaro Arms, MD;  Location: [facility];  Service: Gynecology;  Laterality: N/A;   POLYPECTOMY N/A 02/25/2014   Procedure: POLYPECTOMY;  Surgeon: Lazaro Arms, MD;  Location: [facility];  Service: Gynecology;  Laterality: N/A;   RESECTION DISTAL CLAVICAL Right 03/26/2015   Procedure: OPEN DISTAL CLAVICAL RESECTION ;  Surgeon: Beverely Low, MD;  Location: [facility] OR;  Service: Orthopedics;  Laterality: Right;    FAMILY HISTORY: Family History  Problem Relation Age of  Onset   Hypertension Mother    Pneumonia Father    Arthritis Sister    Diabetes Paternal Grandmother     SOCIAL HISTORY:  reports that she has never smoked. She has never been exposed to tobacco smoke. She has never used smokeless tobacco. She reports that she does not drink alcohol and does not use drugs.  PERFORMANCE STATUS: The patient's performance status is 2 - Symptomatic, <50% confined to bed  PHYSICAL EXAM: Most Recent Vital Signs: Blood pressure (!) 140/72, pulse 85, temperature 98 F (36.7 C), temperature source Oral, resp. rate 14, height 5\' 1"  (1.549 m), weight 245 lb 13 oz (111.5 kg), SpO2 99 %. BP (!) 140/72   Pulse 85   Temp 98.6 F (37 C) (Axillary)   Resp 14   Ht 5\' 1"  (1.549 m)   Wt 245 lb 13 oz (111.5 kg)   SpO2 99%   BMI 46.45 kg/m  General appearance: alert, cooperative, and appears stated age Lungs: clear to auscultation bilaterally Abdomen:  Soft, with mild tenderness below the umbilicus on the right side.  Hematoma palpable. Extremities:  No edema or cyanosis.  LABORATORY DATA:  Results for orders placed or performed during the hospital encounter of 04/18/23 (from the past 48 hour(s))  Hemoglobin and hematocrit, blood     Status: Abnormal   Collection Time: 04/22/23  6:24 PM  Result Value Ref Range   Hemoglobin 9.8 (L) 12.0 - 15.0 g/dL   HCT 16.1 (L) 09.6 - 04.5 %    Comment: Performed at [facility], [address] [place], [place] [address]  Hemoglobin and hematocrit, blood     Status: Abnormal   Collection Time: 04/22/23 11:39 PM  Result Value Ref Range   Hemoglobin 9.6 (L) 12.0 - 15.0 g/dL   HCT 19.1 (L) 47.8 - 29.5 %    Comment: Performed at [facility], [address] [place], [place] [address]  Basic metabolic panel     Status: Abnormal   Collection Time: 04/23/23  6:16 AM  Result Value Ref Range   Sodium 132 (L) 135 - 145 mmol/L   Potassium 4.2 3.5 - 5.1 mmol/L   Chloride 93 (L) 98 - 111 mmol/L   CO2 28 22 - 32 mmol/L   Glucose,  Bld 105 (H) 70 - 99 mg/dL    Comment: Glucose reference range applies only to samples taken after fasting for at least 8 hours.   BUN 39 (H) 8 - 23 mg/dL   Creatinine, Ser 8.65 (H) 0.44 - 1.00 mg/dL   Calcium 8.9 8.9 - 78.4 mg/dL   GFR, Estimated 26 (L) >60 mL/min    Comment: (NOTE) Calculated using the CKD-EPI Creatinine Equation (2021)    Anion gap 11 5 - 15    Comment: Performed at [facility], [address] [place], [place] [address]  Magnesium  Status: None   Collection Time: 04/23/23  6:16 AM  Result Value Ref Range   Magnesium 2.3 1.7 - 2.4 mg/dL    Comment: Performed at [facility], [address] [place], [place] [address]  Hemoglobin and hematocrit, blood     Status: Abnormal   Collection Time: 04/23/23  6:16 AM  Result Value Ref Range   Hemoglobin 10.1 (L) 12.0 - 15.0 g/dL   HCT 60.4 (L) 54.0 - 98.1 %    Comment: Performed at [facility], [address] [place], [place] [address]  Hemoglobin and hematocrit, blood     Status: Abnormal   Collection Time: 04/23/23 12:35 PM  Result Value Ref Range   Hemoglobin 9.1 (L) 12.0 - 15.0 g/dL   HCT 82.9 (L) 56.2 - 13.0 %    Comment: Performed at [facility], [address] [place], [place] [address]  CBC     Status: Abnormal   Collection Time: 04/24/23  5:07 AM  Result Value Ref Range   WBC 9.8 4.0 - 10.5 K/uL   RBC 2.95 (L) 3.87 - 5.11 MIL/uL   Hemoglobin 8.9 (L) 12.0 - 15.0 g/dL   HCT 46.9 (L) 62.9 - 52.8 %   MCV 95.9 80.0 - 100.0 fL   MCH 30.2 26.0 - 34.0 pg   MCHC 31.4 30.0 - 36.0 g/dL   RDW 41.3 (H) 24.4 - 01.0 %   Platelets 240 150 - 400 K/uL   nRBC 0.0 0.0 - 0.2 %    Comment: Performed at [facility], [address], [place], [place] [address]  Basic metabolic panel     Status: Abnormal   Collection Time: 04/24/23  5:07 AM  Result Value Ref Range   Sodium 133 (L) 135 - 145 mmol/L   Potassium 4.3 3.5 - 5.1 mmol/L   Chloride 95 (L) 98 - 111 mmol/L   CO2 31 22 - 32 mmol/L   Glucose, Bld 102 (H) 70 - 99  mg/dL    Comment: Glucose reference range applies only to samples taken after fasting for at least 8 hours.   BUN 39 (H) 8 - 23 mg/dL   Creatinine, Ser 6.64 (H) 0.44 - 1.00 mg/dL   Calcium 8.9 8.9 - 40.3 mg/dL   GFR, Estimated 31 (L) >60 mL/min    Comment: (NOTE) Calculated using the CKD-EPI Creatinine Equation (2021)    Anion gap 7 5 - 15    Comment: Performed at [facility], [address], [place], [place] [address]  Magnesium     Status: Abnormal   Collection Time: 04/24/23  5:07 AM  Result Value Ref Range   Magnesium 2.6 (H) 1.7 - 2.4 mg/dL    Comment: Performed at [facility], [address], [place], [place] [address]      RADIOGRAPHY: No results found.       ASSESSMENT and PLAN:  1.  Pulmonary embolism: - History of polymyalgia rheumatica and rheumatoid arthritis on chronic prednisone, esophageal dysmotility, temporal arteritis, chronic lower extremity edema presenting with positive shortness of breath and dyspnea on exertion over the past several weeks. - NM perfusion scan (04/18/2023): A single wedge-shaped perfusion defect within the lateral aspect of the superior segment right lower lobe compatible with pulmonary embolism. - Ultrasound lower extremities (04/18/2023): No femoral popliteal DVT. - IV heparin was started, transition to p.o. apixaban on 04/21/2023. - CTAP (04/21/2023): 8.4 x 8.4 x 4.6 cm layering rectus sheath hematoma along the inferior aspect of the right rectus musculature. - Anticoagulation  stopped due to rectus sheath hematoma. - Recommend repeating CTAP without contrast tomorrow to follow-up on the hematoma. - Also recommend obtaining CT chest angiogram if her creatinine clearance allows. - Will discuss with Dr. Arbutus Leas.  All questions were answered. The patient knows to call the clinic with any problems, questions or concerns. We can certainly see the patient much sooner if necessary.    Doreatha Massed

## 2023-04-25 ENCOUNTER — Inpatient Hospital Stay (HOSPITAL_COMMUNITY): Payer: Medicare Other

## 2023-04-25 DIAGNOSIS — S301XXD Contusion of abdominal wall, subsequent encounter: Secondary | ICD-10-CM | POA: Diagnosis not present

## 2023-04-25 DIAGNOSIS — I10 Essential (primary) hypertension: Secondary | ICD-10-CM | POA: Diagnosis not present

## 2023-04-25 DIAGNOSIS — I5033 Acute on chronic diastolic (congestive) heart failure: Secondary | ICD-10-CM | POA: Diagnosis not present

## 2023-04-25 DIAGNOSIS — M069 Rheumatoid arthritis, unspecified: Secondary | ICD-10-CM

## 2023-04-25 DIAGNOSIS — I2699 Other pulmonary embolism without acute cor pulmonale: Secondary | ICD-10-CM | POA: Diagnosis not present

## 2023-04-25 LAB — CBC
HCT: 27 % — ABNORMAL LOW (ref 36.0–46.0)
Hemoglobin: 8.4 g/dL — ABNORMAL LOW (ref 12.0–15.0)
MCH: 29.7 pg (ref 26.0–34.0)
MCHC: 31.1 g/dL (ref 30.0–36.0)
MCV: 95.4 fL (ref 80.0–100.0)
Platelets: 265 10*3/uL (ref 150–400)
RBC: 2.83 MIL/uL — ABNORMAL LOW (ref 3.87–5.11)
RDW: 16.3 % — ABNORMAL HIGH (ref 11.5–15.5)
WBC: 10.6 10*3/uL — ABNORMAL HIGH (ref 4.0–10.5)
nRBC: 0 % (ref 0.0–0.2)

## 2023-04-25 NOTE — Assessment & Plan Note (Addendum)
Pulmonary V/Q scan with single wedge shaped perfusion defect within the lateral aspect superior segment of right lower lobe.  02 saturation 96% on room air.    Echocardiogram with preserved LV systolic function with EF 60 to 65%, mild LVH, RV systolic function preserved, RV with mild enlargement, trivial pericardial effusion, no significant valvular disease.   Lower extremities doppler US with no DVT.  Blood pressure systolic 43 to 139 mmHg.    06/29 Anticoagulation has been stopped due to rectus muscle bleeding.  07/03 follow up CT scan with interval progression of the right rectus sheath hematoma with new component seen inferiorly in the right rectus sheath.   Plan to continue to hold on anticoagulation for now. She seems to be hemodynamically stable. Plan to do Chest CT contrast today, to follow up on PE, if negative may be able to hold on anticoagulation.  Follow up with hematology.

## 2023-04-25 NOTE — Progress Notes (Addendum)
Progress Note   Patient: Nicole [name] ZDG:[id number] DOB: November 15, 1945 DOA: 04/18/2023     6 DOS: the patient was seen and examined on 04/25/2023   Brief hospital course: 77 year old female with a history of hypertension, polymyalgia rheumatica on chronic prednisone, rheumatoid arthritis on chronic prednisone, esophageal dysmotility, hyperlipidemia, secondary hyperparathyroidism, temporal arteritis and chronic lower extremity edema presenting with worsening shortness of breath and dyspnea on exertion over the past several weeks. The patient is a difficult historian.  History is supplemented from speaking with the patient's son and reviewed the medical record.  Nevertheless, it appears that the patient has been having shortness of breath and dyspnea on exertion at least for the past year, but it has worsened in the past month.  The patient has chronic lower extremity edema which has been well-documented throughout the medical record, particularly their evaluations from cardiology [facility]) and vascular surgery ([facility]).  Nevertheless, the patient feels that that her edema has been worsened over the past several weeks.  She states that she has been having some chest pressure on and off at rest as well as with movement.  Most of the time, she takes some antacids, and she has relief for several hours before it returns.  In addition she states that belching also seems to help her chest discomfort.  She denies any nausea, vomiting, diarrhea, abdominal pain, fevers, chills.  There is no hematochezia or melena.  Interestingly, she states that she saw her PCP about 3 weeks prior to this admission because of her edema and shortness of breath.  She states that her PCP increased her diuretic to 3 times daily from once daily.  However review of the medical record shows that she had been on torsemide 40 mg twice daily in the past.  In the ED, the patient was afebrile and hemodynamically stable with oxygen saturation 96%  room air.  WBC 9.5, hemoglobin 10.3, platelets 250,000.Sodium 135, potassium 5.2, bicarbonate 26, serum creatinine 2.24.  The patient was started on IV furosemide and given Lokelma.  VQ scan showed a single wedge-shaped perfusion defect in the lateral aspect of the superior right lower lobe consistent with pulmonary embolus.  The patient was started on IV heparin. She was subsequently transitioned to po apixaban.  Unfortunately, she developed a spontaneous rectus sheath hematoma.  Her anticoagulation was stopped and risks/benefits/alternatives were discussed with pt and family.  She was monitored off anticoagulation and she remained clinically stable.  Hematology was consulted for opinion.  Assessment and Plan: * Acute pulmonary embolus (HCC) Pulmonary V/Q scan with single wedge shaped perfusion defect within the lateral aspect superior segment of right lower lobe.  02 saturation 95% on 1 L/min per [place].   Echocardiogram with preserved LV systolic function with EF 60 to 65%, mild LVH, RV systolic function preserved, RV with mild enlargement, trivial pericardial effusion, no significant valvular disease.   Lower extremities doppler US with no DVT.  Blood pressure systolic 117 to 139 mmHg.   06/29 Anticoagulation has been stopped due to rectus muscle bleeding.   Plan to continue to hold on anticoagulation for now. She seems to be hemodynamically stable. Continue close monitoring and plan to resume anticoagulation when no signs of bleeding.   Rectus sheath hematoma, subsequent encounter 06/29 CT scan with layering rectus sheath hematoma along the inferior aspect of the right rectus musculature measuring 8,4 x 8,4 x 4.6 cm   Discussed with general surgery and IR with recommendations for conservative treatment, no invasive interventions.  Today hgb is 8,4   Plan to repeat CT abdomen and pelvis to assess resolution of hematoma.  Patient's pain has improved.   Acute on chronic diastolic  (congestive) heart failure (HCC) Echocardiogram with preserved LV systolic function with EF 60 to 65%, mild LVH, RV systolic function preserved, RV with mild enlargement, trivial pericardial effusion, no significant valvular disease.   Volume has improved, off furosemide.  Since admission is negative 7,019 ml.  Continue blood pressure monitoring.   HTN (hypertension) Continue blood pressure monitoring.   Dyslipidemia, continue with statin therapy.   CKD (chronic kidney disease), stage IV (HCC) Hyponatremia.   Renal function with serum cr at 1,70 with K at 4,3 and serum bicarbonate at 31. Na 133.   Off furosemide and follow up renal function and electrolytes in am.   Rheumatoid arthritis (HCC) Polymyalgia rheumatica.  Patient has been on chronic systemic corticosteroids, 10 mg prednisone.  Continue pain control.   Obesity, Class III, BMI 40-49.9 (morbid obesity) (HCC) Calculated BMI is 46.6  Patient uses wheelchair for mobility. Only able to transfer from bed to chair.  Poor functional physical capacity.   Chronic pain syndrome Continue oral analgesics and gabapentin.         Subjective: Patient with improvement in abdominal pain, no nausea or vomiting, chest pain only to palpation.   Physical Exam: Vitals:   04/25/23 0454 04/25/23 0700 04/25/23 0800 04/25/23 0900  BP:  132/65 (!) 142/65   Pulse:  81 88 84  Resp:  13 19 16   Temp: 98.2 F (36.8 C) 98.6 F (37 C)    TempSrc: Oral Oral    SpO2:  99% 95% 91%  Weight: 111.9 kg     Height:       Neurology awake and alert ENT with mild pallor Cardiovascular with S1 and S2 present and rhythmic with no gallops, rubs or murmurs Respiratory with no rales or wheezing Abdomen with no distention or ecchymosis Trace non pitting lower extremity edema Positive pain on chest palpation, reproducible pain  Data Reviewed:    Family Communication: no family at the bedside.  I spoke with patient's son over the phone, we  talked in detail about patient's condition, plan of care and prognosis and all questions were addressed.   Disposition: Status is: Inpatient Remains inpatient appropriate because: monitor for bleeding   Planned Discharge Destination: Home    Author: Coralie Keens, MD 04/25/2023 11:03 AM  For on call review [email or web address].

## 2023-04-25 NOTE — Assessment & Plan Note (Signed)
Calculated BMI is 46.6  Patient uses wheelchair for mobility. Only able to transfer from bed to chair.  Poor functional physical capacity.

## 2023-04-25 NOTE — Assessment & Plan Note (Addendum)
06/29 CT scan with layering rectus sheath hematoma along the inferior aspect of the right rectus musculature measuring 8,4 x 8,4 x 4.6 cm   Discussed with general surgery and IR with recommendations for conservative treatment, no invasive interventions.   07/03 follow up CT scan with interval progression of the right rectus sheath hematoma with new component seen inferiorly in the right rectus sheath.   Now hematoma is 10.0 x 4.3 x 7.5 cm, and the new hematoma is 7,6 x 4.5 x 4.9 cm.   Follow up hgb is 8.6  Plan to continue anticoagulation for now. Pain has improved with analgesics, oxycodone and IV hydromorphone.  PT and OT as tolerated.

## 2023-04-25 NOTE — Assessment & Plan Note (Addendum)
Echocardiogram with preserved LV systolic function with EF 60 to 65%, mild LVH, RV systolic function preserved, RV with mild enlargement, trivial pericardial effusion, no significant valvular disease.   Volume has improved, off furosemide.  Since admission is negative 7,339 ml.  Continue blood pressure monitoring.

## 2023-04-26 DIAGNOSIS — I2699 Other pulmonary embolism without acute cor pulmonale: Secondary | ICD-10-CM | POA: Diagnosis not present

## 2023-04-26 DIAGNOSIS — I5033 Acute on chronic diastolic (congestive) heart failure: Secondary | ICD-10-CM | POA: Diagnosis not present

## 2023-04-26 DIAGNOSIS — S301XXD Contusion of abdominal wall, subsequent encounter: Secondary | ICD-10-CM | POA: Diagnosis not present

## 2023-04-26 DIAGNOSIS — I10 Essential (primary) hypertension: Secondary | ICD-10-CM | POA: Diagnosis not present

## 2023-04-26 LAB — BASIC METABOLIC PANEL
Anion gap: 7 (ref 5–15)
BUN: 35 mg/dL — ABNORMAL HIGH (ref 8–23)
CO2: 29 mmol/L (ref 22–32)
Calcium: 8.8 mg/dL — ABNORMAL LOW (ref 8.9–10.3)
Chloride: 98 mmol/L (ref 98–111)
Creatinine, Ser: 1.43 mg/dL — ABNORMAL HIGH (ref 0.44–1.00)
GFR, Estimated: 38 mL/min — ABNORMAL LOW (ref >60)
Glucose, Bld: 88 mg/dL (ref 70–99)
Potassium: 4.3 mmol/L (ref 3.5–5.1)
Sodium: 134 mmol/L — ABNORMAL LOW (ref 135–145)

## 2023-04-26 LAB — CBC WITH DIFFERENTIAL/PLATELET
Abs Immature Granulocytes: 0.17 10*3/uL — ABNORMAL HIGH (ref 0.00–0.07)
Basophils Absolute: 0.1 10*3/uL (ref 0.0–0.1)
Basophils Relative: 1 %
Eosinophils Absolute: 0.3 10*3/uL (ref 0.0–0.5)
Eosinophils Relative: 3 %
HCT: 26.8 % — ABNORMAL LOW (ref 36.0–46.0)
Hemoglobin: 8.4 g/dL — ABNORMAL LOW (ref 12.0–15.0)
Immature Granulocytes: 2 %
Lymphocytes Relative: 21 %
Lymphs Abs: 2.3 10*3/uL (ref 0.7–4.0)
MCH: 29.5 pg (ref 26.0–34.0)
MCHC: 31.3 g/dL (ref 30.0–36.0)
MCV: 94 fL (ref 80.0–100.0)
Monocytes Absolute: 1.1 10*3/uL — ABNORMAL HIGH (ref 0.1–1.0)
Monocytes Relative: 10 %
Neutro Abs: 6.8 10*3/uL (ref 1.7–7.7)
Neutrophils Relative %: 63 %
Platelets: 280 10*3/uL (ref 150–400)
RBC: 2.85 MIL/uL — ABNORMAL LOW (ref 3.87–5.11)
RDW: 16.6 % — ABNORMAL HIGH (ref 11.5–15.5)
WBC: 10.6 10*3/uL — ABNORMAL HIGH (ref 4.0–10.5)
nRBC: 0.2 % (ref 0.0–0.2)

## 2023-04-26 MED ORDER — DICLOFENAC SODIUM 1 % EX GEL
2.0000 g | Freq: Four times a day (QID) | CUTANEOUS | Status: DC
  Administered 2023-04-26 – 2023-05-01 (×21): 2 g via TOPICAL
  Filled 2023-04-26 (×4): qty 100

## 2023-04-26 MED ORDER — HYDROMORPHONE HCL 1 MG/ML IJ SOLN
0.5000 mg | INTRAMUSCULAR | Status: DC | PRN
  Administered 2023-04-26 – 2023-04-27 (×3): 0.5 mg via INTRAVENOUS
  Filled 2023-04-26 (×3): qty 0.5

## 2023-04-26 NOTE — Care Management Important Message (Signed)
Important Message  Patient Details  Name: Nicole [name] MRN: [id number] Date of Birth: 1946-02-19   Medicare Important Message Given:  Yes     Corey Harold 04/26/2023, 11:20 AM

## 2023-04-26 NOTE — Progress Notes (Signed)
Patient continues to have pain, given new order for PRN dilaudid via IV for severe pain , patient tearful this shift states she did not sleep well and was having " vivid dreams" like she wasn't asleep . Appetite fair, patient remains on purewick due to weakness and unable to get OOB this shift due to pain. Adequate urine output. Patient remains alert and oriented x 4.

## 2023-04-26 NOTE — Evaluation (Signed)
Occupational Therapy Evaluation Patient Details Name: Nicole [name] MRN: [id number] DOB: [birth year] Today's Date: 04/26/2023   History of Present Illness 77 y.o F admitted on 04/18/23 due to [facility] and DOE. Pt found to have an acute PE and developed a spontaneous rectus sheath hematoma. PMH significant for hypertension, polymyalgia rheumatica on chronic prednisone, rheumatoid arthritis on chronic prednisone, esophageal dysmotility, hyperlipidemia, secondary hyperparathyroidism, temporal arteritis and chronic lower extremity edema.   Clinical Impression   Pt admitted for concerns listed above. PTA pt reported that she was requiring min to mod assist from her aides with dressing and bathing, and IADL's. She has been relying on [facility] for main mobility due to increasing weakness. At this time, pt requiring max assist +2 for safety and mod assist for bed mobility. Additionally, she is requiring overall mod to max assist with all ADL's. Recommending pt have continued skilled therapy once medically stable to discharge. OT will follow acutely.      Recommendations for follow up therapy are one component of a multi-disciplinary discharge planning process, led by the attending physician.  Recommendations may be updated based on patient status, additional functional criteria and insurance authorization.   Assistance Recommended at Discharge Frequent or constant Supervision/Assistance  Patient can return home with the following Two people to help with walking and/or transfers;Two people to help with bathing/dressing/bathroom;Assistance with cooking/housework;Assist for transportation;Help with stairs or ramp for entrance    Functional Status Assessment  Patient has had a recent decline in their functional status and demonstrates the ability to make significant improvements in function in a reasonable and predictable amount of time.  Equipment Recommendations  BSC/3in1;Tub/shower bench    Recommendations for Other  Services       Precautions / Restrictions Precautions Precautions: Fall Restrictions Weight Bearing Restrictions: No      Mobility Bed Mobility Overal bed mobility: Needs Assistance Bed Mobility: Supine to Sit, Sit to Supine     Supine to sit: Mod assist, HOB elevated Sit to supine: Mod assist   General bed mobility comments: Assist with lifting trunk to sit and to bring BLE back into bed for return to supine    Transfers Overall transfer level: Needs assistance                 General transfer comment: Unable to stand with +1 assist safely at this time.      Balance Overall balance assessment: Needs assistance Sitting-balance support: Bilateral upper extremity supported, Feet supported Sitting balance-Leahy Scale: Fair Sitting balance - Comments: reliant on BUE                                   ADL either performed or assessed with clinical judgement   ADL Overall ADL's : Needs assistance/impaired Eating/Feeding: Set up;Bed level   Grooming: Minimal assistance;Bed level   Upper Body Bathing: Moderate assistance;Sitting;Bed level   Lower Body Bathing: Maximal assistance;Sitting/lateral leans;Bed level   Upper Body Dressing : Minimal assistance;Sitting   Lower Body Dressing: Maximal assistance;Sitting/lateral leans;Bed level   Toilet Transfer: Maximal assistance;+2 for safety/equipment;BSC/3in1;Squat-pivot   Toileting- Clothing Manipulation and Hygiene: Maximal assistance;Sitting/lateral lean       Functional mobility during ADLs: Maximal assistance;+2 for safety/equipment General ADL Comments: Pt presents with increased weakness and quick to fatigue, requiring increased assist with all ADL"s     Vision Baseline Vision/History: 1 Wears glasses Ability to See in Adequate Light: 0 Adequate Patient Visual Report:  No change from baseline Vision Assessment?: No apparent visual deficits     Perception Perception Perception Tested?:  No   Praxis Praxis Praxis tested?: Not tested    Pertinent Vitals/Pain Pain Assessment Pain Assessment: 0-10 Pain Score: 5  Pain Location: "Everywhere" Pain Descriptors / Indicators: Aching, Constant, Discomfort, Grimacing, Guarding Pain Intervention(s): Limited activity within patient's tolerance, Monitored during session, Repositioned     Hand Dominance Right   Extremity/Trunk Assessment Upper Extremity Assessment Upper Extremity Assessment: Generalized weakness   Lower Extremity Assessment Lower Extremity Assessment: Generalized weakness   Cervical / Trunk Assessment Cervical / Trunk Assessment: Kyphotic   Communication Communication Communication: No difficulties   Cognition Arousal/Alertness: Awake/alert Behavior During Therapy: WFL for tasks assessed/performed Overall Cognitive Status: Within Functional Limits for tasks assessed                                       General Comments  VSS on RA    Exercises Exercises: Other exercises Other Exercises Other Exercises: Ankle Pumps x10 Other Exercises: heel slides x10 Other Exercises: lateral UE raises x10   Shoulder Instructions      Home Living Family/patient expects to be discharged to:: Private residence Living Arrangements: Alone Available Help at Discharge: Available PRN/intermittently;Personal care attendant;Home health Type of Home: House Home Access: Ramped entrance     Home Layout: One level     Bathroom Shower/Tub: Sponge bathes at baseline   Bathroom Toilet: Standard Bathroom Accessibility: Yes How Accessible: Accessible via walker Home Equipment: Wheelchair - manual;Cane - single point;Rollator (4 wheels);Hospital bed   Additional Comments: Pt has aides everyday from 6a-12p. Reports they help with whatever she needs.      Prior Functioning/Environment Prior Level of Function : Needs assist             Mobility Comments: lately has primarily been using manual  wheelchair with transfers from bed to mwc or Rollator seat ADLs Comments: Requires assist with all bathing and intermittently with dressing        OT Problem List: Decreased strength;Decreased range of motion;Decreased activity tolerance;Impaired balance (sitting and/or standing);Decreased safety awareness;Cardiopulmonary status limiting activity;Obesity;Impaired UE functional use;Pain      OT Treatment/Interventions: Self-care/ADL training;Therapeutic exercise;Energy conservation;DME and/or AE instruction;Therapeutic activities;Patient/family education;Balance training    OT Goals(Current goals can be found in the care plan section) Acute Rehab OT Goals Patient Stated Goal: To get better OT Goal Formulation: With patient Time For Goal Achievement: 05/10/23 Potential to Achieve Goals: Fair ADL Goals Pt Will Perform Grooming: with set-up;sitting Pt Will Perform Lower Body Bathing: with mod assist;sitting/lateral leans;sit to/from stand Pt Will Perform Lower Body Dressing: with mod assist;sitting/lateral leans;sit to/from stand Pt Will Transfer to Toilet: with mod assist;stand pivot transfer Pt Will Perform Toileting - Clothing Manipulation and hygiene: with min assist;sitting/lateral leans;sit to/from stand;with adaptive equipment  OT Frequency: Min 2X/week    Co-evaluation              AM-PAC OT "6 Clicks" Daily Activity     Outcome Measure Help from another person eating meals?: A Little Help from another person taking care of personal grooming?: A Little Help from another person toileting, which includes using toliet, bedpan, or urinal?: A Lot Help from another person bathing (including washing, rinsing, drying)?: Total Help from another person to put on and taking off regular upper body clothing?: A Lot Help from another person to put on  and taking off regular lower body clothing?: A Lot 6 Click Score: 13   End of Session Nurse Communication: Mobility status  Activity  Tolerance: Patient limited by fatigue;Patient limited by pain Patient left: in bed;with bed alarm set;with call bell/phone within reach  OT Visit Diagnosis: Unsteadiness on feet (R26.81);Other abnormalities of gait and mobility (R26.89);Muscle weakness (generalized) (M62.81)                Time: 4098-1191 OT Time Calculation (min): 29 min Charges:  OT General Charges $OT Visit: 1 Visit OT Evaluation $OT Eval Moderate Complexity: 1 Mod OT Treatments $Self Care/Home Management : 8-22 mins  Trish Mage, OTR/L [facility] Acute Rehab  Keily Lepp Elane Bing Plume 04/26/2023, 12:03 PM

## 2023-04-26 NOTE — Progress Notes (Addendum)
Progress Note   Patient: Nicole [name] MWU:[id number] DOB: 06-Oct-1946 DOA: 04/18/2023     7 DOS: the patient was seen and examined on 04/26/2023   Brief hospital course: Nicole [name] was admitted to the hospital with the working diagnosis of pulmonary embolism.   77 year old female with a history of hypertension, polymyalgia rheumatica on chronic prednisone, rheumatoid arthritis on chronic prednisone, esophageal dysmotility, hyperlipidemia, secondary hyperparathyroidism, temporal arteritis and chronic lower extremity edema presenting with worsening shortness of breath and dyspnea on exertion over the past several weeks. She has chronic edema but feels that that her edema has been worsened over the past several weeks.    In the ED, the patient was afebrile and hemodynamically stable with oxygen saturation 96% room air.  WBC 9.5, hemoglobin 10.3, platelets 250,000.Sodium 135, potassium 5.2, bicarbonate 26, serum creatinine 2.24.  T  The patient was started on IV furosemide and given Lokelma.   VQ scan showed a single wedge-shaped perfusion defect in the lateral aspect of the superior right lower lobe consistent with pulmonary embolus.   The patient was started on IV heparin. She was subsequently transitioned to po apixaban.    Unfortunately, she developed a spontaneous rectus sheath hematoma.  Her anticoagulation was stopped and risks/benefits/alternatives were discussed with pt and family.  She was monitored off anticoagulation and she remained clinically stable.  Hematology was consulted for opinion.  07/03 follow up abdominal CT with worsening bleeding.   Assessment and Plan: * Acute pulmonary embolus (HCC) Pulmonary V/Q scan with single wedge shaped perfusion defect within the lateral aspect superior segment of right lower lobe.  02 saturation 96% on room air.    Echocardiogram with preserved LV systolic function with EF 60 to 65%, mild LVH, RV systolic function preserved, RV with mild  enlargement, trivial pericardial effusion, no significant valvular disease.   Lower extremities doppler US with no DVT.  Blood pressure systolic 117 to 139 mmHg.   06/29 Anticoagulation has been stopped due to rectus muscle bleeding.  07/03 follow up CT scan with interval progression of the right rectus sheath hematoma with new component seen inferiorly in the right rectus sheath.   Plan to continue to hold on anticoagulation for now. She seems to be hemodynamically stable. Continue close monitoring and plan to resume anticoagulation when no signs of further bleeding.  Repeat CT in the next few days.   Rectus sheath hematoma, subsequent encounter 06/29 CT scan with layering rectus sheath hematoma along the inferior aspect of the right rectus musculature measuring 8,4 x 8,4 x 4.6 cm   Discussed with general surgery and IR with recommendations for conservative treatment, no invasive interventions.   07/03 follow up CT scan with interval progression of the right rectus sheath hematoma with new component seen inferiorly in the right rectus sheath.   Now hematoma is 10.0 x 4.3 x 7.5 cm, and the new hematoma is 7,6 x 4.5 x 4.9 cm.   Follow up hgb is 8.4   Plan to continue anticoagulation for now. Patient complains of severe pain that has been refractive to oral oxycodone, will add low dose hydromorphone for better pain control.   Acute on chronic diastolic (congestive) heart failure (HCC) Echocardiogram with preserved LV systolic function with EF 60 to 65%, mild LVH, RV systolic function preserved, RV with mild enlargement, trivial pericardial effusion, no significant valvular disease.   Volume has improved, off furosemide.  Since admission is negative 7,019 ml.  Continue blood pressure monitoring.   HTN (hypertension)  Continue blood pressure monitoring.   Dyslipidemia, continue with statin therapy.   CKD (chronic kidney disease), stage IV (HCC) Hyponatremia.   Renal function  with serum cr at 1,43 with K at 4,3 and serum bicarbonate at 29, Na 134.  Off furosemide and follow up renal function and electrolytes in am.   Rheumatoid arthritis (HCC) Polymyalgia rheumatica.  Patient has been on chronic systemic corticosteroids, 10 mg prednisone.  Continue pain control.   Obesity, Class III, BMI 40-49.9 (morbid obesity) (HCC) Calculated BMI is 46.6  Patient uses wheelchair for mobility. Only able to transfer from bed to chair.  Poor functional physical capacity.   Chronic pain syndrome Continue oral analgesics and gabapentin.    Subjective: Patient with no chest pain, or dyspnea, she has persistent bilateral thigh pain, and pain at the right lower abdominal wall, has been severe in intensity   Physical Exam: Vitals:   04/25/23 1602 04/25/23 1748 04/25/23 2205 04/26/23 0438  BP:  130/71 122/62 123/66  Pulse:  75 82 90  Resp:   20 20  Temp: 98.8 F (37.1 C) 98.6 F (37 C) 98.3 F (36.8 C) 98.5 F (36.9 C)  TempSrc: Oral Oral Oral Oral  SpO2:  99% 92% 96%  Weight:    111.1 kg  Height:       Neurology awake and alert ENT with mild pallor Cardiovascular with S1 and S2 present and rhythmic with no gallops Respiratory with no rales or wheezing, no rhonchi Abdomen with no ecchymosis, mild tender to superficial palpation on the right lower quadrant, no rebound or guarding No lower extremity edema.  Data Reviewed:   Family Communication: no family at the bedside.  I spoke with patient's son over the phone, we talked in detail about patient's condition, plan of care and prognosis and all questions were addressed. We talked about advance directives and patient will continue full code.    Disposition: Status is: Inpatient Remains inpatient appropriate because: monitor bleeding   Planned Discharge Destination: Home    Author: Coralie Keens, MD 04/26/2023 2:48 PM  For on call review [email or web address].

## 2023-04-27 ENCOUNTER — Inpatient Hospital Stay (HOSPITAL_COMMUNITY): Payer: Medicare Other

## 2023-04-27 DIAGNOSIS — I2699 Other pulmonary embolism without acute cor pulmonale: Secondary | ICD-10-CM | POA: Diagnosis not present

## 2023-04-27 DIAGNOSIS — I10 Essential (primary) hypertension: Secondary | ICD-10-CM | POA: Diagnosis not present

## 2023-04-27 DIAGNOSIS — S301XXD Contusion of abdominal wall, subsequent encounter: Secondary | ICD-10-CM | POA: Diagnosis not present

## 2023-04-27 DIAGNOSIS — I5033 Acute on chronic diastolic (congestive) heart failure: Secondary | ICD-10-CM | POA: Diagnosis not present

## 2023-04-27 LAB — BASIC METABOLIC PANEL
Anion gap: 10 (ref 5–15)
BUN: 33 mg/dL — ABNORMAL HIGH (ref 8–23)
CO2: 26 mmol/L (ref 22–32)
Calcium: 8.9 mg/dL (ref 8.9–10.3)
Chloride: 98 mmol/L (ref 98–111)
Creatinine, Ser: 1.39 mg/dL — ABNORMAL HIGH (ref 0.44–1.00)
GFR, Estimated: 39 mL/min — ABNORMAL LOW (ref >60)
Glucose, Bld: 88 mg/dL (ref 70–99)
Potassium: 4.3 mmol/L (ref 3.5–5.1)
Sodium: 134 mmol/L — ABNORMAL LOW (ref 135–145)

## 2023-04-27 LAB — HEMOGLOBIN AND HEMATOCRIT, BLOOD
HCT: 26.9 % — ABNORMAL LOW (ref 36.0–46.0)
Hemoglobin: 8.6 g/dL — ABNORMAL LOW (ref 12.0–15.0)

## 2023-04-27 MED ORDER — DIPHENHYDRAMINE HCL 25 MG PO CAPS
25.0000 mg | ORAL_CAPSULE | Freq: Three times a day (TID) | ORAL | Status: DC | PRN
  Administered 2023-04-27: 25 mg via ORAL
  Filled 2023-04-27: qty 1

## 2023-04-27 MED ORDER — IOHEXOL 350 MG/ML SOLN
75.0000 mL | Freq: Once | INTRAVENOUS | Status: AC | PRN
  Administered 2023-04-27: 75 mL via INTRAVENOUS

## 2023-04-27 NOTE — Progress Notes (Signed)
Patient max assist from bed to chair and then chair to bed. Some due to pain ,

## 2023-04-27 NOTE — Care Management Important Message (Signed)
Important Message  Patient Details  Name: Nicole [name] MRN: [id number] Date of Birth: 1945-12-14   Medicare Important Message Given:  Yes     Corey Harold 04/27/2023, 1:35 PM

## 2023-04-27 NOTE — Progress Notes (Signed)
Progress Note   Patient: Nicole [name] DOB: Mar 14, 1946 DOA: 04/18/2023     8 DOS: the patient was seen and examined on 04/27/2023   Brief hospital course: Nicole [name] was admitted to the hospital with the working diagnosis of pulmonary embolism.   77 year old female with a history of hypertension, polymyalgia rheumatica on chronic prednisone, rheumatoid arthritis on chronic prednisone, esophageal dysmotility, hyperlipidemia, secondary hyperparathyroidism, temporal arteritis and chronic lower extremity edema presenting with worsening shortness of breath and dyspnea on exertion over the past several weeks. She has chronic edema but feels that that her edema has been worsened over the past several weeks.    In the ED, the patient was afebrile and hemodynamically stable with oxygen saturation 96% room air.  WBC 9.5, hemoglobin 10.3, platelets 250,000.Sodium 135, potassium 5.2, bicarbonate 26, serum creatinine 2.24.  T  The patient was started on IV furosemide and given Lokelma.   VQ scan showed a single wedge-shaped perfusion defect in the lateral aspect of the superior right lower lobe consistent with pulmonary embolus.   The patient was started on IV heparin. She was subsequently transitioned to po apixaban.    Unfortunately, she developed a spontaneous rectus sheath hematoma.  Her anticoagulation was stopped and risks/benefits/alternatives were discussed with pt and family.  She was monitored off anticoagulation and she remained clinically stable.  Hematology was consulted for opinion.  07/03 follow up abdominal CT with worsening bleeding.  07/05 renal function has improved, plan for contrast CT chest.   Assessment and Plan: * Acute pulmonary embolus (HCC) Pulmonary V/Q scan with single wedge shaped perfusion defect within the lateral aspect superior segment of right lower lobe.  02 saturation 96% on room air.    Echocardiogram with preserved LV systolic function with EF 60 to  65%, mild LVH, RV systolic function preserved, RV with mild enlargement, trivial pericardial effusion, no significant valvular disease.   Lower extremities doppler US with no DVT.  Blood pressure systolic 43 to 139 mmHg.    06/29 Anticoagulation has been stopped due to rectus muscle bleeding.  07/03 follow up CT scan with interval progression of the right rectus sheath hematoma with new component seen inferiorly in the right rectus sheath.   Plan to continue to hold on anticoagulation for now. She seems to be hemodynamically stable. Plan to do Chest CT contrast today, to follow up on PE, if negative may be able to hold on anticoagulation.  Follow up with hematology.   Rectus sheath hematoma, subsequent encounter 06/29 CT scan with layering rectus sheath hematoma along the inferior aspect of the right rectus musculature measuring 8,4 x 8,4 x 4.6 cm   Discussed with general surgery and IR with recommendations for conservative treatment, no invasive interventions.   07/03 follow up CT scan with interval progression of the right rectus sheath hematoma with new component seen inferiorly in the right rectus sheath.   Now hematoma is 10.0 x 4.3 x 7.5 cm, and the new hematoma is 7,6 x 4.5 x 4.9 cm.   Follow up hgb is 8.6  Plan to continue anticoagulation for now. Pain has improved with analgesics, oxycodone and IV hydromorphone.  PT and OT as tolerated.   Acute on chronic diastolic (congestive) heart failure (HCC) Echocardiogram with preserved LV systolic function with EF 60 to 65%, mild LVH, RV systolic function preserved, RV with mild enlargement, trivial pericardial effusion, no significant valvular disease.   Volume has improved, off furosemide.  Since admission is negative 7,339 ml.  Continue blood pressure monitoring.   HTN (hypertension) Continue blood pressure monitoring.   Dyslipidemia, continue with statin therapy.   CKD (chronic kidney disease), stage IV  (HCC) Hyponatremia.   Renal function is improving with serum cr at 1,39, K is 4,3 and serum bicarbonate 26. Na 134,   Plan to continue close monitoring renal function and monitoring.  Avoid hypotension and nephrotoxic medications.   Rheumatoid arthritis (HCC) Polymyalgia rheumatica.  Patient has been on chronic systemic corticosteroids, 10 mg prednisone.  Continue pain control.   Obesity, Class III, BMI 40-49.9 (morbid obesity) (HCC) Calculated BMI is 46.6  Patient uses wheelchair for mobility. Only able to transfer from bed to chair.  Poor functional physical capacity.   Chronic pain syndrome Continue oral analgesics and gabapentin.         Subjective: Patient reports improvement in thigh and abdominal pain, no dyspnea or chest pain   Physical Exam: Vitals:   04/26/23 0438 04/26/23 2209 04/27/23 0409 04/27/23 0500  BP: 123/66 135/76 (!) 143/69   Pulse: 90 84 87   Resp: 20 20 20    Temp: 98.5 F (36.9 C) 98.6 F (37 C) 98.6 F (37 C)   TempSrc: Oral Oral Oral   SpO2: 96% 97% 97%   Weight: 111.1 kg   112.5 kg  Height:       Neurology awake and alert  ENT with mild pallor with no icterus Cardiovascular with S1 and S2 present and rhythmic with no gallops, rubs or murmurs Respiratory with no rales or wheezing Abdomen with no distention  No lower extremity edema  Data Reviewed:    Family Communication: I spoke with patient's son over the phone  we talked in detail about patient's condition, plan of care and prognosis and all questions were addressed.   Disposition: Status is: Inpatient Remains inpatient appropriate because: bleeding complications   Planned Discharge Destination: Home     Author: Coralie Keens, MD 04/27/2023 10:25 AM  For on call review [email or web address].

## 2023-04-27 NOTE — Evaluation (Signed)
Physical Therapy Evaluation Patient Details Name: STARIA ZEISER MRN: [id number] DOB: [birth year] Today's Date: 04/27/2023  History of Present Illness  77 y.o F admitted on 04/18/23 due to [facility] and DOE. Pt found to have an acute PE and developed a spontaneous rectus sheath hematoma. PMH significant for hypertension, polymyalgia rheumatica on chronic prednisone, rheumatoid arthritis on chronic prednisone, esophageal dysmotility, hyperlipidemia, secondary hyperparathyroidism, temporal arteritis and chronic lower extremity edema.   Clinical Impression  Patient demonstrates slow labored movement for sitting up at bedside requiring HOB raised and use of bed rail, increased BLE strength for completing sit to stands and transferring to chair.  Patient able to take a few unsteady labored side steps without loss of balance, but limited due to c/o fatigue and right side abdominal discomfort.  Patient tolerated sitting up in chair after therapy - nursing staff notified.  Patient will benefit from continued skilled physical therapy in hospital and recommended venue below to increase strength, balance, endurance for safe ADLs and gait.           Assistance Recommended at Discharge Set up Supervision/Assistance  If plan is discharge home, recommend the following:  Can travel by private vehicle  A lot of help with walking and/or transfers;A little help with bathing/dressing/bathroom;Help with stairs or ramp for entrance;Assistance with cooking/housework   Yes    Equipment Recommendations None recommended by PT  Recommendations for Other Services       Functional Status Assessment Patient has had a recent decline in their functional status and demonstrates the ability to make significant improvements in function in a reasonable and predictable amount of time.     Precautions / Restrictions Precautions Precautions: Fall Restrictions Weight Bearing Restrictions: No      Mobility  Bed Mobility Overal  bed mobility: Needs Assistance Bed Mobility: Supine to Sit     Supine to sit: Min assist     General bed mobility comments: slow labored movement requring use of bed rail and Min assist to pull self to sitting    Transfers Overall transfer level: Needs assistance Equipment used: Rolling walker (2 wheels) Transfers: Sit to/from Stand, Bed to chair/wheelchair/BSC Sit to Stand: Min assist   Step pivot transfers: Min assist       General transfer comment: slow labored movement without loss of balance using RW    Ambulation/Gait Ambulation/Gait assistance: Min assist, Mod assist Gait Distance (Feet): 4 Feet Assistive device: Rolling walker (2 wheels) Gait Pattern/deviations: Step-to pattern, Decreased step length - right, Decreased step length - left, Decreased stride length, Trunk flexed Gait velocity: slow     General Gait Details: limited to a few slow labored side steps before having to sit due to fatigue  Stairs            Wheelchair Mobility     Tilt Bed    Modified Rankin (Stroke Patients Only)       Balance Overall balance assessment: Needs assistance Sitting-balance support: Feet supported, No upper extremity supported Sitting balance-Leahy Scale: Good Sitting balance - Comments: seated at EOB   Standing balance support: During functional activity, Reliant on assistive device for balance, Bilateral upper extremity supported Standing balance-Leahy Scale: Poor Standing balance comment: fair/poor with RW                             Pertinent Vitals/Pain Pain Assessment Pain Assessment: Faces Faces Pain Scale: Hurts little more Pain Location: right side of abdomen when  sitting Pain Descriptors / Indicators: Discomfort, Guarding, Sore Pain Intervention(s): Limited activity within patient's tolerance, Monitored during session, Repositioned    Home Living Family/patient expects to be discharged to:: Private residence Living Arrangements:  Alone Available Help at Discharge: Available PRN/intermittently;Personal care attendant;Home health Type of Home: House Home Access: Ramped entrance       Home Layout: One level Home Equipment: Wheelchair - manual;Cane - single point;Rollator (4 wheels);Hospital bed Additional Comments: Pt has aides everyday from 6a-12p. Reports they help with whatever she needs.    Prior Function Prior Level of Function : Needs assist       Physical Assist : Mobility (physical);ADLs (physical) Mobility (physical): Transfers;Gait;Bed mobility ADLs (physical): IADLs;Dressing;Bathing Mobility Comments: lately has primarily been using manual wheelchair with transfers from bed to mwc or Rollator seat ADLs Comments: Requires assist with all bathing and intermittently with dressing     Hand Dominance   Dominant Hand: Right    Extremity/Trunk Assessment   Upper Extremity Assessment Upper Extremity Assessment: Defer to OT evaluation    Lower Extremity Assessment Lower Extremity Assessment: Generalized weakness    Cervical / Trunk Assessment Cervical / Trunk Assessment: Kyphotic  Communication   Communication: No difficulties  Cognition Arousal/Alertness: Awake/alert Behavior During Therapy: WFL for tasks assessed/performed Overall Cognitive Status: Within Functional Limits for tasks assessed                                          General Comments      Exercises     Assessment/Plan    PT Assessment Patient needs continued PT services  PT Problem List Decreased strength;Decreased balance;Pain;Decreased mobility;Decreased activity tolerance       PT Treatment Interventions DME instruction;Functional mobility training;Balance training;Patient/family education;Gait training;Therapeutic activities;Therapeutic exercise    PT Goals (Current goals can be found in the Care Plan section)  Acute Rehab PT Goals Patient Stated Goal: return home with home aides and family to  assist PT Goal Formulation: With patient Time For Goal Achievement: 05/01/23 Potential to Achieve Goals: Good    Frequency Min 3X/week     Co-evaluation               AM-PAC PT "6 Clicks" Mobility  Outcome Measure Help needed turning from your back to your side while in a flat bed without using bedrails?: A Little Help needed moving from lying on your back to sitting on the side of a flat bed without using bedrails?: A Little Help needed moving to and from a bed to a chair (including a wheelchair)?: A Little Help needed standing up from a chair using your arms (e.g., wheelchair or bedside chair)?: A Little Help needed to walk in hospital room?: A Lot Help needed climbing 3-5 steps with a railing? : A Lot 6 Click Score: 16    End of Session   Activity Tolerance: Patient tolerated treatment well;Patient limited by fatigue Patient left: in chair;with call bell/phone within reach Nurse Communication: Mobility status PT Visit Diagnosis: Unsteadiness on feet (R26.81);Muscle weakness (generalized) (M62.81);Other abnormalities of gait and mobility (R26.89)    Time: 6387-5643 PT Time Calculation (min) (ACUTE ONLY): 20 min   Charges:   PT Evaluation $PT Eval Moderate Complexity: 1 Mod PT Treatments $Therapeutic Activity: 8-22 mins PT General Charges $$ ACUTE PT VISIT: 1 Visit         1:56 PM, 04/27/23 Ocie Bob, MPT Physical Therapist  with [facility] 336 [phone] office [phone] mobile phone

## 2023-04-28 DIAGNOSIS — I2699 Other pulmonary embolism without acute cor pulmonale: Secondary | ICD-10-CM | POA: Diagnosis not present

## 2023-04-28 DIAGNOSIS — G894 Chronic pain syndrome: Secondary | ICD-10-CM | POA: Diagnosis not present

## 2023-04-28 DIAGNOSIS — N184 Chronic kidney disease, stage 4 (severe): Secondary | ICD-10-CM | POA: Diagnosis not present

## 2023-04-28 LAB — BASIC METABOLIC PANEL
Anion gap: 10 (ref 5–15)
BUN: 37 mg/dL — ABNORMAL HIGH (ref 8–23)
CO2: 25 mmol/L (ref 22–32)
Calcium: 8.7 mg/dL — ABNORMAL LOW (ref 8.9–10.3)
Chloride: 99 mmol/L (ref 98–111)
Creatinine, Ser: 1.37 mg/dL — ABNORMAL HIGH (ref 0.44–1.00)
GFR, Estimated: 40 mL/min — ABNORMAL LOW (ref >60)
Glucose, Bld: 79 mg/dL (ref 70–99)
Potassium: 4.3 mmol/L (ref 3.5–5.1)
Sodium: 134 mmol/L — ABNORMAL LOW (ref 135–145)

## 2023-04-28 LAB — CBC
HCT: 27 % — ABNORMAL LOW (ref 36.0–46.0)
Hemoglobin: 8.5 g/dL — ABNORMAL LOW (ref 12.0–15.0)
MCH: 29.4 pg (ref 26.0–34.0)
MCHC: 31.5 g/dL (ref 30.0–36.0)
MCV: 93.4 fL (ref 80.0–100.0)
Platelets: 347 10*3/uL (ref 150–400)
RBC: 2.89 MIL/uL — ABNORMAL LOW (ref 3.87–5.11)
RDW: 16.9 % — ABNORMAL HIGH (ref 11.5–15.5)
WBC: 11.8 10*3/uL — ABNORMAL HIGH (ref 4.0–10.5)
nRBC: 0.8 % — ABNORMAL HIGH (ref 0.0–0.2)

## 2023-04-28 MED ORDER — NYSTATIN 100000 UNIT/GM EX POWD
Freq: Two times a day (BID) | CUTANEOUS | Status: DC
  Filled 2023-04-28 (×3): qty 15

## 2023-04-28 NOTE — Progress Notes (Signed)
Pt refused reds clip this morning. Unable to obtain reading.

## 2023-04-28 NOTE — Progress Notes (Signed)
Progress Note   Patient: Nicole [name] ZOX:[id number] DOB: 1946-03-06 DOA: 04/18/2023     9 DOS: the patient was seen and examined on 04/28/2023   Brief hospital course: Nicole [name] was admitted to the hospital with the working diagnosis of pulmonary embolism.   77 year old female with a history of hypertension, polymyalgia rheumatica on chronic prednisone, rheumatoid arthritis on chronic prednisone, esophageal dysmotility, hyperlipidemia, secondary hyperparathyroidism, temporal arteritis and chronic lower extremity edema presenting with worsening shortness of breath and dyspnea on exertion over the past several weeks. She has chronic edema but feels that that her edema has been worsened over the past several weeks.    In the ED, the patient was afebrile and hemodynamically stable with oxygen saturation 96% room air.  WBC 9.5, hemoglobin 10.3, platelets 250,000.Sodium 135, potassium 5.2, bicarbonate 26, serum creatinine 2.24.  T  The patient was started on IV furosemide and given Lokelma.   VQ scan showed a single wedge-shaped perfusion defect in the lateral aspect of the superior right lower lobe consistent with pulmonary embolus.   The patient was started on IV heparin. She was subsequently transitioned to po apixaban.    Unfortunately, she developed a spontaneous rectus sheath hematoma.  Her anticoagulation was stopped and risks/benefits/alternatives were discussed with pt and family.  She was monitored off anticoagulation and she remained clinically stable.  Hematology was consulted for opinion.  07/03 follow up abdominal CT with worsening bleeding.  07/05 renal function has improved, plan for contrast CT chest.   Assessment and Plan: * Acute pulmonary embolus (HCC) Pulmonary V/Q scan with single wedge shaped perfusion defect within the lateral aspect superior segment of right lower lobe.  02 saturation 96% on room air.    Echocardiogram with preserved LV systolic function with EF 60 to  65%, mild LVH, RV systolic function preserved, RV with mild enlargement, trivial pericardial effusion, no significant valvular disease.   Lower extremities doppler US with no DVT.  Blood pressure systolic 43 to 139 mmHg.    06/29 Anticoagulation has been stopped due to rectus muscle bleeding.  07/03 follow up CT scan with interval progression of the right rectus sheath hematoma with new component seen inferiorly in the right rectus sheath.   Plan to continue to hold on anticoagulation for now. She seems to be hemodynamically stable. Plan to do Chest CT contrast today, to follow up on PE, if negative may be able to hold on anticoagulation.  Follow up with hematology.   Rectus sheath hematoma, subsequent encounter 06/29 CT scan with layering rectus sheath hematoma along the inferior aspect of the right rectus musculature measuring 8,4 x 8,4 x 4.6 cm   Discussed with general surgery and IR with recommendations for conservative treatment, no invasive interventions.   07/03 follow up CT scan with interval progression of the right rectus sheath hematoma with new component seen inferiorly in the right rectus sheath.   Now hematoma is 10.0 x 4.3 x 7.5 cm, and the new hematoma is 7,6 x 4.5 x 4.9 cm.   Follow up hgb is 8.5  Plan to continue anticoagulation for now. Pain has improved with analgesics, oxycodone and IV hydromorphone.  PT and OT as tolerated.   Acute on chronic diastolic (congestive) heart failure (HCC) Echocardiogram with preserved LV systolic function with EF 60 to 65%, mild LVH, RV systolic function preserved, RV with mild enlargement, trivial pericardial effusion, no significant valvular disease.   Volume has improved, off furosemide.  Since admission is negative 7,339 ml.  Continue blood pressure monitoring.   HTN (hypertension) Continue blood pressure monitoring.   Dyslipidemia, continue with statin therapy.   CKD (chronic kidney disease), stage IV  (HCC) Hyponatremia.   Renal function is improving with serum cr at 1,37, K is 4,3 and serum bicarbonate 26. Na 134,   Maintain adequate hydration. Avoid hypotension and nephrotoxic medications.  Continue to follow renal function trend.  Rheumatoid arthritis (HCC) Polymyalgia rheumatica.  Patient has been on chronic systemic corticosteroids, 10 mg prednisone.  Continue pain control.  Continue patient follow-up with rheumatology.  Obesity, Class III, BMI 40-49.9 (morbid obesity) (HCC) Calculated BMI is 46.6  Patient uses wheelchair for mobility. Only able to transfer from bed to chair.  Poor functional physical capacity at baseline; per physical therapy close to her baseline and with recommendation for home health services at discharge..   Chronic pain syndrome Continue oral analgesics and gabapentin.    Subjective: No fever, no chest pain, no nausea or vomiting.  Good saturation on room air.  Reporting mild abdominal discomfort and indigestion.  Feeling generally weak.  Physical Exam: Vitals:   04/28/23 0437 04/28/23 0500 04/28/23 1242 04/28/23 1424  BP: 130/73  137/63   Pulse: 84  89   Resp: 20  (!) 24 20  Temp: 97.8 F (36.6 C)  98 F (36.7 C)   TempSrc:   Oral   SpO2: 96%  95%   Weight:  114.7 kg    Height:       General exam: Alert, awake, oriented x 3, no chest pain, no nausea, no vomiting; reporting some mild abdominal discomfort for and indigestion. Respiratory system: Clear to auscultation. Respiratory effort normal.  Good saturation on room air. Cardiovascular system:RRR. No rubs or gallops; no JVD. Gastrointestinal system: Abdomen is obese, nondistended, soft and mildly tender to palpation in her anterior abdominal wall. No organomegaly or masses felt. Normal bowel sounds heard. Central nervous system: Alert and oriented. No focal neurological deficits. Extremities: No cyanosis or clubbing; trace edema appreciated bilaterally. Skin: No  petechiae. Psychiatry: Judgement and insight appear normal. Mood & affect appropriate.    Data Reviewed: CBC: White blood cells 11.8, hemoglobin 8.5 and platelet count 347 K Basic metabolic panel: Sodium 134, potassium 4.3, chloride 99, bicarb 25, BUN 37, creatinine 1.37 and GFR 40   Family Communication: I spoke with patient's son over the phone  we talked in detail about patient's condition, plan of care and prognosis and all questions were addressed.   Disposition: Status is: Inpatient Remains inpatient appropriate because: bleeding complications.    Planned Discharge Destination: Home   Author: Vassie Loll, MD 04/28/2023 7:07 PM  For on call review [email or web address].

## 2023-04-28 NOTE — TOC Progression Note (Signed)
Transition of Care [facility]) - Progression Note    Patient Details  Name: Nicole [name] MRN: [id number] Date of Birth: 05-30-1946  Transition of Care [facility]) CM/SW Contact  Catalina Gravel, [place] Phone Number: 04/28/2023, 5:27 PM  Clinical Narrative:    Pt active with [facility] RN and PT [facility]. CSW asked MD for resumption orders at DC during progression. TOC to continue to follow.     Expected Discharge Plan: Home w Home Health Services Barriers to Discharge: Continued Medical Work up  Expected Discharge Plan and Services In-house Referral: Clinical Social Work   Post Acute Care Choice: Resumption of Svcs/PTA Provider Living arrangements for the past 2 months: Single Family Home                                       Social Determinants of Health (SDOH) Interventions SDOH Screenings   Food Insecurity: No Food Insecurity (04/19/2023)  Housing: Low Risk  (04/19/2023)  Transportation Needs: No Transportation Needs (04/19/2023)  Utilities: Not At Risk (04/19/2023)  Depression (PHQ2-9): Low Risk  (04/15/2020)  Tobacco Use: Low Risk  (04/18/2023)    Readmission Risk Interventions    12/23/2021   11:10 AM 10/04/2021    8:38 AM 09/26/2021   11:14 AM  Readmission Risk Prevention Plan  Transportation Screening Complete Complete Complete  HRI or Home Care Consult  Complete Complete  Social Work Consult for Recovery Care Planning/Counseling  Complete Complete  Palliative Care Screening  Not Applicable Not Applicable  Medication Review [employer]) Complete Complete Complete  HRI or Home Care Consult Complete    SW Recovery Care/Counseling Consult Complete    Palliative Care Screening Not Applicable    Skilled Nursing Facility Not Applicable

## 2023-04-29 DIAGNOSIS — N184 Chronic kidney disease, stage 4 (severe): Secondary | ICD-10-CM | POA: Diagnosis not present

## 2023-04-29 DIAGNOSIS — I2699 Other pulmonary embolism without acute cor pulmonale: Secondary | ICD-10-CM | POA: Diagnosis not present

## 2023-04-29 DIAGNOSIS — G894 Chronic pain syndrome: Secondary | ICD-10-CM | POA: Diagnosis not present

## 2023-04-29 LAB — CBC
HCT: 27.4 % — ABNORMAL LOW (ref 36.0–46.0)
Hemoglobin: 8.6 g/dL — ABNORMAL LOW (ref 12.0–15.0)
MCH: 29.5 pg (ref 26.0–34.0)
MCHC: 31.4 g/dL (ref 30.0–36.0)
MCV: 93.8 fL (ref 80.0–100.0)
Platelets: 340 10*3/uL (ref 150–400)
RBC: 2.92 MIL/uL — ABNORMAL LOW (ref 3.87–5.11)
RDW: 17.2 % — ABNORMAL HIGH (ref 11.5–15.5)
WBC: 10.3 10*3/uL (ref 4.0–10.5)
nRBC: 0.3 % — ABNORMAL HIGH (ref 0.0–0.2)

## 2023-04-29 LAB — BASIC METABOLIC PANEL
Anion gap: 7 (ref 5–15)
BUN: 34 mg/dL — ABNORMAL HIGH (ref 8–23)
CO2: 25 mmol/L (ref 22–32)
Calcium: 8.5 mg/dL — ABNORMAL LOW (ref 8.9–10.3)
Chloride: 103 mmol/L (ref 98–111)
Creatinine, Ser: 1.37 mg/dL — ABNORMAL HIGH (ref 0.44–1.00)
GFR, Estimated: 40 mL/min — ABNORMAL LOW (ref >60)
Glucose, Bld: 82 mg/dL (ref 70–99)
Potassium: 4.5 mmol/L (ref 3.5–5.1)
Sodium: 135 mmol/L (ref 135–145)

## 2023-04-29 NOTE — Progress Notes (Signed)
Patient's son [name] Done) want social worker and therapy to him before his mom can be discharged. This RN made him aware that social worker doesn't work on weekend and that therapy is sometime not available on some weekend. We continue to monitor.

## 2023-04-29 NOTE — Progress Notes (Signed)
Progress Note   Patient: Nicole [name] NFA:[id number] DOB: [birth year] DOA: 04/18/2023     10 DOS: the patient was seen and examined on 04/29/2023   Brief hospital course: Mrs. Chuong was admitted to the hospital with the working diagnosis of pulmonary embolism.   77 year old female with a history of hypertension, polymyalgia rheumatica on chronic prednisone, rheumatoid arthritis on chronic prednisone, esophageal dysmotility, hyperlipidemia, secondary hyperparathyroidism, temporal arteritis and chronic lower extremity edema presenting with worsening shortness of breath and dyspnea on exertion over the past several weeks. She has chronic edema but feels that that her edema has been worsened over the past several weeks.    In the ED, the patient was afebrile and hemodynamically stable with oxygen saturation 96% room air.  WBC 9.5, hemoglobin 10.3, platelets 250,000.Sodium 135, potassium 5.2, bicarbonate 26, serum creatinine 2.24.  T  The patient was started on IV furosemide and given Lokelma.   VQ scan showed a single wedge-shaped perfusion defect in the lateral aspect of the superior right lower lobe consistent with pulmonary embolus.   The patient was started on IV heparin. She was subsequently transitioned to po apixaban.    Unfortunately, she developed a spontaneous rectus sheath hematoma.  Her anticoagulation was stopped and risks/benefits/alternatives were discussed with pt and family.  She was monitored off anticoagulation and she remained clinically stable.  Hematology was consulted for opinion.  07/03 follow up abdominal CT with worsening bleeding.  07/05 renal function has improved, plan for contrast CT chest.   Assessment and Plan: * Acute pulmonary embolus (HCC) Pulmonary V/Q scan with single wedge shaped perfusion defect within the lateral aspect superior segment of right lower lobe.  02 saturation 96% on room air.    Echocardiogram with preserved LV systolic function with EF 60 to  65%, mild LVH, RV systolic function preserved, RV with mild enlargement, trivial pericardial effusion, no significant valvular disease.   Lower extremities doppler US with no DVT.  Blood pressure systolic 43 to 139 mmHg.    06/29 Anticoagulation has been stopped due to rectus muscle bleeding.  07/03 follow up CT scan with interval progression of the right rectus sheath hematoma with new component seen inferiorly in the right rectus sheath.   Plan to continue to hold on anticoagulation for now. She seems to be hemodynamically stable. Plan to do Chest CT contrast today, to follow up on PE, if negative may be able to hold on anticoagulation.  Follow up with hematology.   Rectus sheath hematoma, subsequent encounter 06/29 CT scan with layering rectus sheath hematoma along the inferior aspect of the right rectus musculature measuring 8,4 x 8,4 x 4.6 cm   Discussed with general surgery and IR with recommendations for conservative treatment, no invasive interventions.   07/03 follow up CT scan with interval progression of the right rectus sheath hematoma with new component seen inferiorly in the right rectus sheath.   Now hematoma is 10.0 x 4.3 x 7.5 cm, and the new hematoma is 7,6 x 4.5 x 4.9 cm.   Follow up hgb is 8.6  Plan to continue anticoagulation for now. Pain has improved with analgesics, oxycodone and IV hydromorphone.  PT and OT as tolerated.   Acute on chronic diastolic (congestive) heart failure (HCC) Echocardiogram with preserved LV systolic function with EF 60 to 65%, mild LVH, RV systolic function preserved, RV with mild enlargement, trivial pericardial effusion, no significant valvular disease.   Volume has improved, off furosemide.  Since admission is negative 7,339 ml.  Continue blood pressure monitoring.   HTN (hypertension) Continue blood pressure monitoring.   Dyslipidemia, continue with statin therapy.   CKD (chronic kidney disease), stage IV  (HCC) Hyponatremia.   Renal function is improving with serum cr at 1,37, K is 4,3 and serum bicarbonate 26. Na 134,   Maintain adequate hydration. Avoid hypotension and nephrotoxic medications.  Continue to follow renal function trend.  Rheumatoid arthritis (HCC) Polymyalgia rheumatica.  Patient has been on chronic systemic corticosteroids, 10 mg prednisone.  Continue pain control.  Continue patient follow-up with rheumatology.  Obesity, Class III, BMI 40-49.9 (morbid obesity) (HCC) Calculated BMI is 46.6  Patient uses wheelchair for mobility. Only able to transfer from bed to chair.  Poor functional physical capacity at baseline; per physical therapy close to her baseline and with recommendation for home health services at discharge..   Chronic pain syndrome Continue oral analgesics and gabapentin.    Subjective: No fever, no chest pain, no nausea or vomiting.  Overall feeling better and reporting mild right flank pain.  Physical Exam: Vitals:   04/28/23 2035 04/29/23 0453 04/29/23 0500 04/29/23 1257  BP: 133/81 (!) 144/85  123/70  Pulse: 79 82  94  Resp: 20 20  (!) 24  Temp: 98.1 F (36.7 C) 97.8 F (36.6 C)  98.1 F (36.7 C)  TempSrc:    Oral  SpO2: 95% 95%  95%  Weight:   113.8 kg   Height:       General exam: Alert, awake, oriented x 3; afebrile, no chest pain, no nausea, no vomiting, tolerating diet and in no acute distress. Respiratory system: Clear to auscultation. Respiratory effort normal.  Good saturation on room air. Cardiovascular system:RRR. No rubs or gallops; no JVD Gastrointestinal system: Abdomen is obese, nondistended, soft and mildly tender to palpation in her right flank area; positive bruise appreciated without signs of a spreading or worsening.  Positive bowel sounds. Central nervous system: No focal neurological deficits. Extremities: No cyanosis or clubbing. Skin: No petechiae. Psychiatry: Judgement and insight appear normal. Mood & affect  appropriate.   Data Reviewed: CBC: White blood cells 10.3, hemoglobin 8.6 and platelet count 340 K  Basic metabolic panel: Sodium 135, potassium 4.5, chloride 103, bicarb 25, BUN 34 creatinine 1.37 and GFR 40   Family Communication: I spoke with patient's son over the phone  we talked in detail about patient's condition, plan of care and prognosis and all questions were addressed.   Disposition: Status is: Inpatient Remains inpatient appropriate because: bleeding complications.    Planned Discharge Destination: Home with home health services.   Author: Vassie Loll, MD 04/29/2023 4:24 PM  For on call review [email or web address].

## 2023-04-29 NOTE — TOC Progression Note (Addendum)
Transition of Care [facility]) - Progression Note    Patient Details  Name: Nicole [name] MRN: [id number] Date of Birth: 20-Aug-1946  Transition of Care [facility]) CM/SW Contact  Catalina Gravel, [place] Phone Number: 04/29/2023, 7:33 AM  Clinical Narrative:    RN secure messaged CSW and MD stating that pt son wants to speak with CSW before pt DC. CSW reviewed notes.  DC plan involves HHPT, orders in active with [facility]. MD wanted to clarify was SNF denied. CSW shared TOC hand off states that. MD suggested instead family may have wanted specific facilities.  CSW agreed to review SNF offer/reach out to son as he requested. CSW sent a text to son stating he can call when ready to talk.  TOC to follow.   Addendum: Called son @10 :00 am and left VM. CSW spoke to son.  Shared a medical concern, bruise to skin- secure chat to MD. He is not sure pt is able to transfer, and strong.  He wants to speak with [name]. Not interested in SNF until he knows what her limitations are so he will come to hospital tomorrow and try to talk to [facility]. Or can someone reach out to him to discuss her ability to transfer before DC.  TOC to follow.    Expected Discharge Plan: Home w Home Health Services Barriers to Discharge: Continued Medical Work up  Expected Discharge Plan and Services In-house Referral: Clinical Social Work   Post Acute Care Choice: Resumption of Svcs/PTA Provider Living arrangements for the past 2 months: Single Family Home                                       Social Determinants of Health (SDOH) Interventions SDOH Screenings   Food Insecurity: No Food Insecurity (04/19/2023)  Housing: Low Risk  (04/19/2023)  Transportation Needs: No Transportation Needs (04/19/2023)  Utilities: Not At Risk (04/19/2023)  Depression (PHQ2-9): Low Risk  (04/15/2020)  Tobacco Use: Low Risk  (04/18/2023)    Readmission Risk Interventions    12/23/2021   11:10 AM 10/04/2021    8:38 AM 09/26/2021   11:14 AM   Readmission Risk Prevention Plan  Transportation Screening Complete Complete Complete  HRI or Home Care Consult  Complete Complete  Social Work Consult for Recovery Care Planning/Counseling  Complete Complete  Palliative Care Screening  Not Applicable Not Applicable  Medication Review Oceanographer) Complete Complete Complete  HRI or Home Care Consult Complete    SW Recovery Care/Counseling Consult Complete    Palliative Care Screening Not Applicable    Skilled Nursing Facility Not Applicable

## 2023-04-30 ENCOUNTER — Inpatient Hospital Stay (HOSPITAL_COMMUNITY): Payer: Medicare Other

## 2023-04-30 DIAGNOSIS — I2699 Other pulmonary embolism without acute cor pulmonale: Secondary | ICD-10-CM | POA: Diagnosis not present

## 2023-04-30 DIAGNOSIS — G894 Chronic pain syndrome: Secondary | ICD-10-CM | POA: Diagnosis not present

## 2023-04-30 DIAGNOSIS — N184 Chronic kidney disease, stage 4 (severe): Secondary | ICD-10-CM | POA: Diagnosis not present

## 2023-04-30 MED ORDER — IOHEXOL 9 MG/ML PO SOLN
ORAL | Status: AC
  Filled 2023-04-30: qty 500

## 2023-04-30 NOTE — Progress Notes (Signed)
Physical Therapy Treatment Patient Details Name: Nicole [name] MRN: [id number] DOB: October 14, 1946 Today's Date: 04/30/2023   History of Present Illness 77 y.o F admitted on 04/18/23 due to [facility] and DOE. Pt found to have an acute PE and developed a spontaneous rectus sheath hematoma. PMH significant for hypertension, polymyalgia rheumatica on chronic prednisone, rheumatoid arthritis on chronic prednisone, esophageal dysmotility, hyperlipidemia, secondary hyperparathyroidism, temporal arteritis and chronic lower extremity edema.    PT Comments  Patient demonstrates slow labored movement for sitting up at bedside requiring HOB raised, very unsteady on feet requiring increased time for completing step pivot transfer to chair with both hands on armrest of chair.  Patient limited for functional activities mostly due to c/o severe pain right side of abdomen and tolerated sitting up in chair with her son present in room after therapy.  Patient will benefit from continued skilled physical therapy in hospital and recommended venue below to increase strength, balance, endurance for safe ADLs and gait.      Assistance Recommended at Discharge Set up Supervision/Assistance  If plan is discharge home, recommend the following:  Can travel by private vehicle    A lot of help with walking and/or transfers;Help with stairs or ramp for entrance;Assistance with cooking/housework;A lot of help with bathing/dressing/bathroom   Yes  Equipment Recommendations  None recommended by PT    Recommendations for Other Services       Precautions / Restrictions Precautions Precautions: Fall Restrictions Weight Bearing Restrictions: No     Mobility  Bed Mobility Overal bed mobility: Needs Assistance Bed Mobility: Supine to Sit     Supine to sit: Min assist     General bed mobility comments: increased time, labored movement with c/o severe pain left side of abdomen    Transfers Overall transfer level: Needs  assistance   Transfers: Sit to/from Stand, Bed to chair/wheelchair/BSC Sit to Stand: Min assist   Step pivot transfers: Min assist       General transfer comment: increased time, labored movement, had to lean on armrest of chair for support    Ambulation/Gait Ambulation/Gait assistance: Min assist Gait Distance (Feet): 3 Feet Assistive device:  (leaning on armrest of chair) Gait Pattern/deviations: Step-to pattern, Decreased step length - right, Decreased step length - left, Decreased stride length, Trunk flexed Gait velocity: slow     General Gait Details: able to complete a few unsteady labored steps leaning on armrest of chair   Stairs             Wheelchair Mobility     Tilt Bed    Modified Rankin (Stroke Patients Only)       Balance Overall balance assessment: Needs assistance Sitting-balance support: Feet supported, No upper extremity supported Sitting balance-Leahy Scale: Fair Sitting balance - Comments: fair/good seated at EOB   Standing balance support: During functional activity, Bilateral upper extremity supported Standing balance-Leahy Scale: Poor Standing balance comment: lean on armrest of chair                            Cognition Arousal/Alertness: Awake/alert Behavior During Therapy: WFL for tasks assessed/performed Overall Cognitive Status: Within Functional Limits for tasks assessed                                          Exercises      General Comments  Pertinent Vitals/Pain Pain Assessment Pain Assessment: Faces Faces Pain Scale: Hurts even more Pain Location: right side of abdomen when sitting Pain Descriptors / Indicators: Discomfort, Guarding, Sore Pain Intervention(s): Limited activity within patient's tolerance, Monitored during session, Repositioned    Home Living                          Prior Function            PT Goals (current goals can now be found in the  care plan section) Acute Rehab PT Goals Patient Stated Goal: return home with home aides and family to assist PT Goal Formulation: With patient/family Time For Goal Achievement: 05/07/23 Potential to Achieve Goals: Good Progress towards PT goals: Progressing toward goals    Frequency    Min 3X/week      PT Plan Discharge plan needs to be updated    Co-evaluation              AM-PAC PT "6 Clicks" Mobility   Outcome Measure  Help needed turning from your back to your side while in a flat bed without using bedrails?: A Little Help needed moving from lying on your back to sitting on the side of a flat bed without using bedrails?: A Little Help needed moving to and from a bed to a chair (including a wheelchair)?: A Little Help needed standing up from a chair using your arms (e.g., wheelchair or bedside chair)?: A Lot Help needed to walk in hospital room?: A Lot Help needed climbing 3-5 steps with a railing? : Total 6 Click Score: 14    End of Session   Activity Tolerance: Patient tolerated treatment well;Patient limited by fatigue Patient left: in chair;with call bell/phone within reach;with family/visitor present Nurse Communication: Mobility status PT Visit Diagnosis: Unsteadiness on feet (R26.81);Muscle weakness (generalized) (M62.81);Other abnormalities of gait and mobility (R26.89)     Time: 1610-9604 PT Time Calculation (min) (ACUTE ONLY): 30 min  Charges:    $Therapeutic Activity: 23-37 mins                       2:42 PM, 04/30/23 Ocie Bob, MPT Physical Therapist with [facility] [phone] office [phone] mobile phone

## 2023-04-30 NOTE — Progress Notes (Signed)
Progress Note   Patient: Nicole [name] HQI:[id number] DOB: 08-Nov-1945 DOA: 04/18/2023     11 DOS: the patient was seen and examined on 04/30/2023   Brief hospital course: Nicole [name] was admitted to the hospital with the working diagnosis of pulmonary embolism.   77 year old female with a history of hypertension, polymyalgia rheumatica on chronic prednisone, rheumatoid arthritis on chronic prednisone, esophageal dysmotility, hyperlipidemia, secondary hyperparathyroidism, temporal arteritis and chronic lower extremity edema presenting with worsening shortness of breath and dyspnea on exertion over the past several weeks. She has chronic edema but feels that that her edema has been worsened over the past several weeks.    In the ED, the patient was afebrile and hemodynamically stable with oxygen saturation 96% room air.  WBC 9.5, hemoglobin 10.3, platelets 250,000.Sodium 135, potassium 5.2, bicarbonate 26, serum creatinine 2.24.  T  The patient was started on IV furosemide and given Lokelma.   VQ scan showed a single wedge-shaped perfusion defect in the lateral aspect of the superior right lower lobe consistent with pulmonary embolus.   The patient was started on IV heparin. She was subsequently transitioned to po apixaban.    Unfortunately, she developed a spontaneous rectus sheath hematoma.  Her anticoagulation was stopped and risks/benefits/alternatives were discussed with pt and family.  She was monitored off anticoagulation and she remained clinically stable.  Hematology was consulted for opinion.  07/03 follow up abdominal CT with worsening bleeding.  07/05 renal function has improved, plan for contrast CT chest.   Assessment and Plan: * Acute pulmonary embolus (HCC) Pulmonary V/Q scan with single wedge shaped perfusion defect within the lateral aspect superior segment of right lower lobe.  02 saturation 96% on room air.    Echocardiogram with preserved LV systolic function with EF 60 to  65%, mild LVH, RV systolic function preserved, RV with mild enlargement, trivial pericardial effusion, no significant valvular disease.   Lower extremities doppler US with no DVT.  Blood pressure systolic 43 to 139 mmHg.    06/29 Anticoagulation has been stopped due to rectus muscle bleeding.   07/03 follow up CT scan with interval progression of the right rectus sheath hematoma with new component seen inferiorly in the right rectus sheath.  Repeat CT scan as mentioned below on 04/30/2023 demonstrated a stable hematoma without signs or changes to suggest further bleeding.  Plan to continue to hold on anticoagulation for now. She seems to be hemodynamically stable. Patient CT scan of the chest demonstrated no pulmonary embolism; no anticoagulation is needed.  Rectus sheath hematoma, subsequent encounter 06/29 CT scan with layering rectus sheath hematoma along the inferior aspect of the right rectus musculature measuring 8,4 x 8,4 x 4.6 cm   Discussed with general surgery and IR with recommendations for conservative treatment, no invasive interventions.   07/03 follow up CT scan with interval progression of the right rectus sheath hematoma with new component seen inferiorly in the right rectus sheath.   Now hematoma is 10.0 x 4.3 x 7.5 cm, and the new hematoma is 7,6 x 4.5 x 4.9 cm.   Given complaints of abdominal pain overnight repeat CT scan has been made demonstrating stable hematoma findings without acute abnormalities.  Follow up hgb is 8.6  Plan to continue anticoagulation for now. Pain has improved with analgesics, oxycodone and IV hydromorphone.  PT and OT as tolerated.   Acute on chronic diastolic (congestive) heart failure (HCC) Echocardiogram with preserved LV systolic function with EF 60 to 65%, mild LVH, RV  systolic function preserved, RV with mild enlargement, trivial pericardial effusion, no significant valvular disease.   Volume has improved/stabilized, off furosemide.   Since admission is negative 7,339 ml.  Continue blood pressure monitoring.  Continue to follow daily weights and strict I's and O's. Will recommend the use of as needed diuretics only.  HTN (hypertension) Continue blood pressure monitoring.   Dyslipidemia, continue with statin therapy.   CKD (chronic kidney disease), stage IV (HCC) Hyponatremia.   Renal function is improving with serum cr at 1,37, K is 4,3 and serum bicarbonate 26. Na 134,   Maintain adequate hydration. Avoid hypotension and nephrotoxic medications.  Continue to follow renal function trend.  Rheumatoid arthritis (HCC) Polymyalgia rheumatica.  Patient has been on chronic systemic corticosteroids, 10 mg prednisone.  Continue pain control.  Continue patient follow-up with rheumatology.  Obesity, Class III, BMI 40-49.9 (morbid obesity) (HCC) Calculated BMI is 46.6  Patient uses wheelchair for mobility. Only able to transfer from bed to chair.  Poor functional physical capacity at baseline; per physical therapy close to her baseline and with recommendation for home health services at discharge..   Chronic pain syndrome Continue oral analgesics and gabapentin.   Physical deconditioning -Further evaluation and discussion with patient's family has triggered decision to proceed with skilled nursing facility placement for short term rehab and conditioning. -TOC aware and assisting with placement.   Subjective:  Afebrile, no chest pain, no nausea or vomiting.  Overnight complaining of significant abdominal pain.  Physical Exam: Vitals:   04/29/23 0500 04/29/23 1257 04/29/23 2143 04/30/23 0514  BP:  123/70 133/68 (!) 162/79  Pulse:  94 76 89  Resp:  (!) 24 20 20   Temp:  98.1 F (36.7 C) 98.6 F (37 C) 98.4 F (36.9 C)  TempSrc:  Oral Oral Oral  SpO2:  95% 96% 93%  Weight: 113.8 kg   113.8 kg  Height:       General exam: Alert, awake, oriented x 3; no requiring oxygen supplementation; no nausea or  vomiting.  Reporting abdominal pain and generalized weakness. Respiratory system: Good air movement bilaterally; no using accessory muscle. Cardiovascular system:RRR. No rubs or gallops. Gastrointestinal system: Abdomen is obese, nondistended, soft and with positive bowel sounds.  Slightly tender to palpation especially in her flank right-sided area; bruises from rectus hematoma appreciated; unchanged. Central nervous system: No focal neurological deficits. Extremities: No cyanosis or clubbing. Skin: No petechiae; patient with redness appreciated in between the skin folds (improving after nystatin therapy initiated).  Right flank bruise present and unchanged from recent hematoma. Psychiatry: Judgement and insight appear normal. Mood & affect appropriate.   Latest data Reviewed: CBC: White blood cells 10.3, hemoglobin 8.6 and platelet count 340 K  Basic metabolic panel: Sodium 135, potassium 4.5, chloride 103, bicarb 25, BUN 34 creatinine 1.37 and GFR 40   Family Communication: I spoke with patient's son over the phone.   Disposition: Status is: Inpatient Remains inpatient appropriate because: bleeding complications.    Planned Discharge Destination: Skilled nursing facility.   Author: Vassie Loll, MD 04/30/2023 4:55 PM  For on call review [email or web address].

## 2023-04-30 NOTE — Progress Notes (Signed)
Spoke via telephone with pt's son [name], he states that he does want to be present for his mom's PT session today. States will be here by 1030am. PT [name] notified and agreeable.

## 2023-04-30 NOTE — TOC Progression Note (Addendum)
Transition of Care [facility]) - Progression Note    Patient Details  Name: Nicole [name] MRN: [id number] Date of Birth: 11-29-1945  Transition of Care [facility]) CM/SW Contact  Elliot Gault, LCSW Phone Number: 04/30/2023, 11:24 AM  Clinical Narrative:     TOC following. Pt and son now request SNF rehab at dc. Discussed CMS provider options and will refer as requested.   Will follow.  1603: Reviewed bed offers with pt/son. Anticipating possible dc tomorrow. TOC will follow.  Expected Discharge Plan: Skilled Nursing Facility Barriers to Discharge: Continued Medical Work up  Expected Discharge Plan and Services In-house Referral: Clinical Social Work   Post Acute Care Choice: Skilled Nursing Facility Living arrangements for the past 2 months: Single Family Home                                       Social Determinants of Health (SDOH) Interventions SDOH Screenings   Food Insecurity: No Food Insecurity (04/19/2023)  Housing: Low Risk  (04/19/2023)  Transportation Needs: No Transportation Needs (04/19/2023)  Utilities: Not At Risk (04/19/2023)  Depression (PHQ2-9): Low Risk  (04/15/2020)  Tobacco Use: Low Risk  (04/18/2023)    Readmission Risk Interventions    12/23/2021   11:10 AM 10/04/2021    8:38 AM 09/26/2021   11:14 AM  Readmission Risk Prevention Plan  Transportation Screening Complete Complete Complete  HRI or Home Care Consult  Complete Complete  Social Work Consult for Recovery Care Planning/Counseling  Complete Complete  Palliative Care Screening  Not Applicable Not Applicable  Medication Review Oceanographer) Complete Complete Complete  HRI or Home Care Consult Complete    SW Recovery Care/Counseling Consult Complete    Palliative Care Screening Not Applicable    Skilled Nursing Facility Not Applicable

## 2023-04-30 NOTE — Progress Notes (Signed)
   04/30/23 1300  ReDS Vest / Clip  Station Marker B  Ruler Value 44  ReDS Value Range < 36  ReDS Actual Value 31  Anatomical Comments pt sitting upright

## 2023-04-30 NOTE — Progress Notes (Signed)
Patient complains that her abdominal pain is more severe than yesterday and was asking if more test can be done to determine why.

## 2023-05-01 DIAGNOSIS — M1009 Idiopathic gout, multiple sites: Secondary | ICD-10-CM | POA: Diagnosis not present

## 2023-05-01 DIAGNOSIS — R531 Weakness: Secondary | ICD-10-CM | POA: Diagnosis not present

## 2023-05-01 DIAGNOSIS — E212 Other hyperparathyroidism: Secondary | ICD-10-CM | POA: Diagnosis not present

## 2023-05-01 DIAGNOSIS — I5032 Chronic diastolic (congestive) heart failure: Secondary | ICD-10-CM | POA: Diagnosis not present

## 2023-05-01 DIAGNOSIS — M109 Gout, unspecified: Secondary | ICD-10-CM | POA: Diagnosis not present

## 2023-05-01 DIAGNOSIS — S81802A Unspecified open wound, left lower leg, initial encounter: Secondary | ICD-10-CM | POA: Diagnosis not present

## 2023-05-01 DIAGNOSIS — I2699 Other pulmonary embolism without acute cor pulmonale: Secondary | ICD-10-CM | POA: Diagnosis not present

## 2023-05-01 DIAGNOSIS — F331 Major depressive disorder, recurrent, moderate: Secondary | ICD-10-CM | POA: Diagnosis not present

## 2023-05-01 DIAGNOSIS — N184 Chronic kidney disease, stage 4 (severe): Secondary | ICD-10-CM | POA: Diagnosis not present

## 2023-05-01 DIAGNOSIS — I5033 Acute on chronic diastolic (congestive) heart failure: Secondary | ICD-10-CM | POA: Diagnosis not present

## 2023-05-01 DIAGNOSIS — K219 Gastro-esophageal reflux disease without esophagitis: Secondary | ICD-10-CM | POA: Diagnosis present

## 2023-05-01 DIAGNOSIS — R5381 Other malaise: Secondary | ICD-10-CM | POA: Diagnosis not present

## 2023-05-01 DIAGNOSIS — D84821 Immunodeficiency due to drugs: Secondary | ICD-10-CM | POA: Diagnosis not present

## 2023-05-01 DIAGNOSIS — M069 Rheumatoid arthritis, unspecified: Secondary | ICD-10-CM | POA: Diagnosis not present

## 2023-05-01 DIAGNOSIS — G894 Chronic pain syndrome: Secondary | ICD-10-CM | POA: Diagnosis present

## 2023-05-01 DIAGNOSIS — M488X6 Other specified spondylopathies, lumbar region: Secondary | ICD-10-CM | POA: Diagnosis not present

## 2023-05-01 DIAGNOSIS — M79642 Pain in left hand: Secondary | ICD-10-CM | POA: Diagnosis not present

## 2023-05-01 DIAGNOSIS — Z1331 Encounter for screening for depression: Secondary | ICD-10-CM | POA: Diagnosis not present

## 2023-05-01 DIAGNOSIS — A419 Sepsis, unspecified organism: Secondary | ICD-10-CM | POA: Diagnosis not present

## 2023-05-01 DIAGNOSIS — I7091 Generalized atherosclerosis: Secondary | ICD-10-CM | POA: Diagnosis not present

## 2023-05-01 DIAGNOSIS — S301XXD Contusion of abdominal wall, subsequent encounter: Secondary | ICD-10-CM | POA: Diagnosis not present

## 2023-05-01 DIAGNOSIS — D529 Folate deficiency anemia, unspecified: Secondary | ICD-10-CM | POA: Diagnosis not present

## 2023-05-01 DIAGNOSIS — M6281 Muscle weakness (generalized): Secondary | ICD-10-CM | POA: Diagnosis not present

## 2023-05-01 DIAGNOSIS — B351 Tinea unguium: Secondary | ICD-10-CM | POA: Diagnosis not present

## 2023-05-01 DIAGNOSIS — I10 Essential (primary) hypertension: Secondary | ICD-10-CM | POA: Diagnosis present

## 2023-05-01 DIAGNOSIS — M068A Other specified rheumatoid arthritis, other specified site: Secondary | ICD-10-CM | POA: Diagnosis not present

## 2023-05-01 DIAGNOSIS — R4181 Age-related cognitive decline: Secondary | ICD-10-CM | POA: Diagnosis not present

## 2023-05-01 DIAGNOSIS — R52 Pain, unspecified: Secondary | ICD-10-CM | POA: Diagnosis not present

## 2023-05-01 DIAGNOSIS — I7389 Other specified peripheral vascular diseases: Secondary | ICD-10-CM | POA: Diagnosis not present

## 2023-05-01 DIAGNOSIS — Z7401 Bed confinement status: Secondary | ICD-10-CM | POA: Diagnosis not present

## 2023-05-01 DIAGNOSIS — Z760 Encounter for issue of repeat prescription: Secondary | ICD-10-CM | POA: Diagnosis not present

## 2023-05-01 DIAGNOSIS — M353 Polymyalgia rheumatica: Secondary | ICD-10-CM | POA: Diagnosis not present

## 2023-05-01 DIAGNOSIS — G4700 Insomnia, unspecified: Secondary | ICD-10-CM | POA: Diagnosis not present

## 2023-05-01 DIAGNOSIS — G9341 Metabolic encephalopathy: Secondary | ICD-10-CM | POA: Diagnosis not present

## 2023-05-01 DIAGNOSIS — E7849 Other hyperlipidemia: Secondary | ICD-10-CM | POA: Diagnosis not present

## 2023-05-01 DIAGNOSIS — D649 Anemia, unspecified: Secondary | ICD-10-CM | POA: Diagnosis not present

## 2023-05-01 LAB — CBC
HCT: 29.1 % — ABNORMAL LOW (ref 36.0–46.0)
Hemoglobin: 9 g/dL — ABNORMAL LOW (ref 12.0–15.0)
MCH: 29.4 pg (ref 26.0–34.0)
MCHC: 30.9 g/dL (ref 30.0–36.0)
MCV: 95.1 fL (ref 80.0–100.0)
Platelets: 377 10*3/uL (ref 150–400)
RBC: 3.06 MIL/uL — ABNORMAL LOW (ref 3.87–5.11)
RDW: 17.1 % — ABNORMAL HIGH (ref 11.5–15.5)
WBC: 11.6 10*3/uL — ABNORMAL HIGH (ref 4.0–10.5)
nRBC: 0.3 % — ABNORMAL HIGH (ref 0.0–0.2)

## 2023-05-01 MED ORDER — TORSEMIDE 40 MG PO TABS: 40.0000 mg | ORAL_TABLET | Freq: Every day | ORAL | Status: AC

## 2023-05-01 MED ORDER — HYDROCODONE-ACETAMINOPHEN 5-325 MG PO TABS: 1.0000 | ORAL_TABLET | Freq: Four times a day (QID) | ORAL | 0 refills | Status: AC | PRN

## 2023-05-01 MED ORDER — PANTOPRAZOLE SODIUM 40 MG PO TBEC: 40.0000 mg | DELAYED_RELEASE_TABLET | Freq: Two times a day (BID) | ORAL | Status: AC

## 2023-05-01 MED ORDER — NYSTATIN 100000 UNIT/GM EX POWD: Freq: Two times a day (BID) | CUTANEOUS | Status: AC

## 2023-05-01 NOTE — Progress Notes (Signed)
Patient discharged to [facility] and rehab. Report called and given to Yolande Jolly RN. Transported by EMS to awaiting facility. IV discontinued,catheter intact.

## 2023-05-01 NOTE — Care Management Important Message (Signed)
Important Message  Patient Details  Name: Nicole [name] MRN: [id number] Date of Birth: 1946-03-17   Medicare Important Message Given:  Yes (spoke with son Mixtli Towles at [phone] to review letter, no additional copy needed)     Corey Harold 05/01/2023, 1:44 PM

## 2023-05-01 NOTE — Progress Notes (Signed)
Patient slept on and off this shift, Vicodin given once this shift for back pain. Continued to monitor.

## 2023-05-01 NOTE — Discharge Summary (Signed)
Physician Discharge Summary   Patient: Nicole [name] MRN: [id number] DOB: 05-Feb-1946  Admit date:     04/18/2023  Discharge date: 05/01/23  Discharge Physician: Vassie Loll   PCP: [name], PA-C   Recommendations at discharge:  See to follow hemoglobin trend/stability Repeat basic metabolic panel to follow electrolytes and renal function Continue assisting patient with weight management Reassess blood pressure and adjust antihypertensive regimen as needed.  Discharge Diagnoses: Principal Problem:   Acute pulmonary embolus (HCC) Active Problems:   Rectus sheath hematoma, subsequent encounter   Acute on chronic diastolic (congestive) heart failure (HCC)   HTN (hypertension)   CKD (chronic kidney disease), stage IV (HCC)   Rheumatoid arthritis (HCC)   Obesity, Class III, BMI 40-49.9 (morbid obesity) (HCC)   Chronic pain syndrome  Hospital Course: Nicole [name] was admitted to the hospital with the working diagnosis of pulmonary embolism.   77 year old female with a history of hypertension, polymyalgia rheumatica on chronic prednisone, rheumatoid arthritis on chronic prednisone, esophageal dysmotility, hyperlipidemia, secondary hyperparathyroidism, temporal arteritis and chronic lower extremity edema presenting with worsening shortness of breath and dyspnea on exertion over the past several weeks. She has chronic edema but feels that that her edema has been worsened over the past several weeks.    In the ED, the patient was afebrile and hemodynamically stable with oxygen saturation 96% room air.  WBC 9.5, hemoglobin 10.3, platelets 250,000.Sodium 135, potassium 5.2, bicarbonate 26, serum creatinine 2.24.  T  The patient was started on IV furosemide and given Lokelma.   VQ scan showed a single wedge-shaped perfusion defect in the lateral aspect of the superior right lower lobe consistent with pulmonary embolus.   The patient was started on IV heparin. She was subsequently  transitioned to po apixaban.    Unfortunately, she developed a spontaneous rectus sheath hematoma.  Her anticoagulation was stopped and risks/benefits/alternatives were discussed with pt and family.  She was monitored off anticoagulation and she remained clinically stable.  Hematology was consulted for opinion.  07/03 follow up abdominal CT with worsening bleeding.  07/05 renal function has improved, plan for contrast CT chest.   Assessment and Plan: * Acute pulmonary embolus (HCC) Pulmonary V/Q scan with single wedge shaped perfusion defect within the lateral aspect superior segment of right lower lobe.  02 saturation 96% on room air.     Echocardiogram with preserved LV systolic function with EF 60 to 65%, mild LVH, RV systolic function preserved, RV with mild enlargement, trivial pericardial effusion, no significant valvular disease.    Lower extremities doppler US with no DVT.   Blood pressure systolic 43 to 139 mmHg.     06/29 Anticoagulation has been stopped due to rectus muscle bleeding.    07/03 follow up CT scan with interval progression of the right rectus sheath hematoma with new component seen inferiorly in the right rectus sheath.  Repeat CT scan as mentioned below on 04/30/2023 demonstrated a stable hematoma without signs or changes to suggest further bleeding.   Plan to continue to hold on anticoagulation for now. She seems to be hemodynamically stable. Patient CT scan of the chest demonstrated no pulmonary embolism; no anticoagulation is needed.   Rectus sheath hematoma, subsequent encounter 06/29 CT scan with layering rectus sheath hematoma along the inferior aspect of the right rectus musculature measuring 8,4 x 8,4 x 4.6 cm    Discussed with general surgery and IR with recommendations for conservative treatment, no invasive interventions.    07/03 follow up  CT scan with interval progression of the right rectus sheath hematoma with new component seen inferiorly in the  right rectus sheath.    Now hematoma is 10.0 x 4.3 x 7.5 cm, and the new hematoma is 7,6 x 4.5 x 4.9 cm.    Given complaints of abdominal pain overnight repeat CT scan has been made demonstrating stable hematoma findings without acute abnormalities.   Follow up hgb is 9.0   Plan to continue anticoagulation for now. Pain has improved with analgesics, oxycodone and IV hydromorphone.  PT and OT as tolerated.    Acute on chronic diastolic (congestive) heart failure (HCC) Echocardiogram with preserved LV systolic function with EF 60 to 65%, mild LVH, RV systolic function preserved, RV with mild enlargement, trivial pericardial effusion, no significant valvular disease.   Volume has improved/stabilized, off furosemide.  Since admission is negative 7,339 ml.  Will recommend the use of torsemide 40 mg daily -Continue low-sodium diet and daily weights.   HTN (hypertension) Continue blood pressure monitoring.   Dyslipidemia, continue with statin therapy.    CKD (chronic kidney disease), stage IV (HCC) -Hyponatremia.  -Renal function is improving with serum cr at 1,37, K is 4,3 and serum bicarbonate 26. Na 134,  -Maintain adequate hydration. -Avoid hypotension and nephrotoxic medications.  -Safe to resume torsemide 40 mg daily on 05/03/2023.   Rheumatoid arthritis (HCC) Polymyalgia rheumatica.  Patient has been on chronic systemic corticosteroids, 10 mg prednisone.  Continue pain control.  Continue patient follow-up with rheumatology as an outpatient.   Obesity, Class III, BMI 40-49.9 (morbid obesity) (HCC) -Body mass index is 48.15 kg/m. -Low-calorie diet and portion control discussed with patient.  Chronic pain syndrome -Continue home analgesic regimen using gabapentin, Aspercreme and as needed Vicodin.  Physical deconditioning -Physical therapy has seen patient with recommendations to a skilled nursing facility at discharge for short-term rehabilitation and conditioning prior to  return home -Patient to be discharged to [facility] in [place]  GERD -Continue PPI  Consultants: None Procedures performed: See below for x-ray reports. Disposition: Short-term rehabilitation at a skilled nursing facility prior to return home. Diet recommendation: Heart healthy/low-sodium and low calorie diet.  DISCHARGE MEDICATION: Allergies as of 05/01/2023       Reactions   Oxycontin [oxycodone Hcl] Swelling   Pt tolerates hydromorphone.   Sulfa Antibiotics Other (See Comments)   Sores   Tramadol    "Makes me feel like I am falling"   Penicillins Rash   Tolerated augmentin   Sulfasalazine Other (See Comments)   Sores        Medication List     STOP taking these medications    colchicine 0.6 MG tablet   meloxicam 7.5 MG tablet Commonly known as: MOBIC   spironolactone 25 MG tablet Commonly known as: ALDACTONE   traZODone 50 MG tablet Commonly known as: DESYREL       TAKE these medications    acetaminophen 325 MG tablet Commonly known as: TYLENOL Take 2 tablets (650 mg total) by mouth every 6 (six) hours as needed for mild pain or headache (or Fever >/= 101).   allopurinol 100 MG tablet Commonly known as: ZYLOPRIM Take 100 mg by mouth daily.   atorvastatin 20 MG tablet Commonly known as: LIPITOR Take 20 mg by mouth every morning.   calcitRIOL 0.25 MCG capsule Commonly known as: ROCALTROL Take 0.25 mcg by mouth See admin instructions. Take 0.25 every Mon, Wed, Fri.   citalopram 20 MG tablet Commonly known as: CELEXA Take  20 mg by mouth daily.   gabapentin 300 MG capsule Commonly known as: NEURONTIN Take 300 mg by mouth 3 (three) times daily.   HYDROcodone-acetaminophen 5-325 MG tablet Commonly known as: NORCO/VICODIN Take 1 tablet by mouth every 6 (six) hours as needed for severe pain.   metoprolol tartrate 25 MG tablet Commonly known as: LOPRESSOR Take 25 mg by mouth 2 (two) times daily.   montelukast 10 MG tablet Commonly known as:  SINGULAIR Take 10 mg by mouth at bedtime.   nystatin powder Commonly known as: MYCOSTATIN/NYSTOP Apply topically 2 (two) times daily.   pantoprazole 40 MG tablet Commonly known as: Protonix Take 1 tablet (40 mg total) by mouth 2 (two) times daily.   predniSONE 10 MG tablet Commonly known as: DELTASONE Take 10 mg by mouth daily with breakfast. What changed: Another medication with the same name was removed. Continue taking this medication, and follow the directions you see here.   tamsulosin 0.4 MG Caps capsule Commonly known as: FLOMAX Take 0.4 mg by mouth daily after breakfast.   Torsemide 40 MG Tabs Take 40 mg by mouth daily. Dose to be further adjusted and medication resume when renal function fully recovered and patient able to tolerate PO's. Start taking on: May 03, 2023 What changed:  when to take this These instructions start on May 03, 2023. If you are unsure what to do until then, ask your doctor or other care provider.   trolamine salicylate 10 % cream Commonly known as: ASPERCREME Apply 1 application  topically as needed for muscle pain.        Follow-up Information     [name], PA-C. Schedule an appointment as soon as possible for a visit in 10 day(s).   Specialty: Family Medicine Why: After discharge from the skilled nursing facility. Contact information: [address] [place] [address] [phone]                Discharge Exam: Filed Weights   04/29/23 0500 04/30/23 0514 05/01/23 0551  Weight: 113.8 kg 113.8 kg 115.6 kg   General exam: Alert, awake, oriented x 3; no requiring oxygen supplementation; no nausea or vomiting.  Reporting abdominal pain and generalized weakness. Respiratory system: Good air movement bilaterally; no using accessory muscle. Cardiovascular system:RRR. No rubs or gallops. Gastrointestinal system: Abdomen is obese, nondistended, soft and with positive bowel sounds.  Slightly tender to palpation  especially in her flank right-sided area; bruises from rectus hematoma appreciated; unchanged. Central nervous system: No focal neurological deficits. Extremities: No cyanosis or clubbing. Skin: No petechiae; patient with redness appreciated in between the skin folds (improving after nystatin therapy initiated).  Right flank bruise present and unchanged from recent hematoma. Psychiatry: Judgement and insight appear normal. Mood & affect appropriate.   Condition at discharge: Stable and improved.  The results of significant diagnostics from this hospitalization (including imaging, microbiology, ancillary and laboratory) are listed below for reference.   Imaging Studies: CT ABDOMEN PELVIS WO CONTRAST  Result Date: 04/30/2023 CLINICAL DATA:  Abdominal pain, acute, nonlocalized EXAM: CT ABDOMEN AND PELVIS WITHOUT CONTRAST TECHNIQUE: Multidetector CT imaging of the abdomen and pelvis was performed following the standard protocol without IV contrast. RADIATION DOSE REDUCTION: This exam was performed according to the departmental dose-optimization program which includes automated exposure control, adjustment of the mA and/or kV according to patient size and/or use of iterative reconstruction technique. COMPARISON:  CT scan abdomen and pelvis from 04/25/2023. FINDINGS: Lower chest: There are atelectatic changes in the visualized  lung bases. No overt consolidation. No pleural effusion. The heart is normal in size. No pericardial effusion. Visualized thoracic aortic and coronary artery calcifications noted. Note is also made of mitral annulus calcifications. Hepatobiliary: The liver is normal in size. Non-cirrhotic configuration. No suspicious mass. No intrahepatic or extrahepatic bile duct dilation. Gallbladder is surgically absent. Pancreas: Unremarkable. No pancreatic ductal dilatation or surrounding inflammatory changes. Spleen: Within normal limits. No focal lesion. Adrenals/Urinary Tract: Adrenal glands are  unremarkable. No suspicious renal mass. No hydronephrosis. No renal or ureteric calculi. Unremarkable urinary bladder. Stomach/Bowel: There is a small sliding hiatal hernia. No disproportionate dilation of the small or large bowel loops. No evidence of abnormal bowel wall thickening or inflammatory changes. The appendix is unremarkable. There are multiple diverticula mainly in the left hemi colon, without imaging signs of diverticulitis. Vascular/Lymphatic: No ascites or pneumoperitoneum. No abdominal or pelvic lymphadenopathy, by size criteria. No aneurysmal dilation of the major abdominal arteries. There are mild peripheral atherosclerotic vascular calcifications of the aorta and its major branches. Reproductive: Normal-size anteverted uterus. Redemonstration of subserosal calcified leiomyoma arising from the left side of the fundus of the uterus versus broad ligament. No large adnexal mass. Other: Redemonstration of hematoma within the right paramedian rectus sheath. The hematoma is bilobed and appears essentially unchanged since the prior study measuring up to 14.6 cm craniocaudally. The largest component is superiorly located and measures up to 3.4 x 10.8 cm orthogonally on axial plane. The smaller inferior component is more hyperattenuating favoring relatively recent bleeding; however, essentially unchanged since the prior study. There is a tiny fat containing umbilical hernia. There is pelvic muscle atrophy and fatty replacement. Musculoskeletal: No suspicious osseous lesions. There are moderate multilevel degenerative changes in the visualized spine. Moderate degenerative changes of the left hip joint. IMPRESSION: 1. Stable appearance of hematoma within the right paramedian rectus sheath. 2. Multiple other nonacute observations, as described above. Aortic Atherosclerosis (ICD10-I70.0). Electronically Signed   By: Jules Schick M.D.   On: 04/30/2023 15:20   CT Angio Chest Pulmonary Embolism (PE) W or WO  Contrast  Result Date: 04/27/2023 CLINICAL DATA:  PE suspected EXAM: CT ANGIOGRAPHY CHEST WITH CONTRAST TECHNIQUE: Multidetector CT imaging of the chest was performed using the standard protocol during bolus administration of intravenous contrast. Multiplanar CT image reconstructions and MIPs were obtained to evaluate the vascular anatomy. RADIATION DOSE REDUCTION: This exam was performed according to the departmental dose-optimization program which includes automated exposure control, adjustment of the mA and/or kV according to patient size and/or use of iterative reconstruction technique. CONTRAST:  75mL OMNIPAQUE IOHEXOL 350 MG/ML SOLN COMPARISON:  None Available. FINDINGS: Cardiovascular: Satisfactory opacification of the pulmonary arteries to the segmental level. No evidence of pulmonary embolism. Cardiomegaly. Three-vessel coronary artery calcifications. No pericardial effusion. Aortic atherosclerosis. Mediastinum/Nodes: No enlarged mediastinal, hilar, or axillary lymph nodes. Thyroid gland, trachea, and esophagus demonstrate no significant findings. Lungs/Pleura: Lungs are clear. No pleural effusion or pneumothorax. Upper Abdomen: No acute abnormality.  Status post cholecystectomy. Musculoskeletal: No chest wall abnormality. No acute osseous findings. Severe bilateral glenohumeral arthrosis. Review of the MIP images confirms the above findings. IMPRESSION: 1. Negative examination for pulmonary embolism. 2. No acute CT findings of the chest. 3. Cardiomegaly and coronary artery disease. Aortic Atherosclerosis (ICD10-I70.0). Electronically Signed   By: Jearld Lesch M.D.   On: 04/27/2023 13:26   CT ABDOMEN PELVIS WO CONTRAST  Result Date: 04/25/2023 CLINICAL DATA:  Follow-up rectus sheath hematoma. EXAM: CT ABDOMEN AND PELVIS WITHOUT CONTRAST TECHNIQUE: Multidetector CT  imaging of the abdomen and pelvis was performed following the standard protocol without IV contrast. RADIATION DOSE REDUCTION: This exam was  performed according to the departmental dose-optimization program which includes automated exposure control, adjustment of the mA and/or kV according to patient size and/or use of iterative reconstruction technique. COMPARISON:  04/21/2023 FINDINGS: Lower chest: Dependent atelectasis. Hepatobiliary: No suspicious focal abnormality in the liver on this study without intravenous contrast. Gallbladder is surgically absent. No intrahepatic or extrahepatic biliary dilation. Pancreas: No focal mass lesion. No dilatation of the main duct. No intraparenchymal cyst. No peripancreatic edema. Spleen: No splenomegaly. No suspicious focal mass lesion. Adrenals/Urinary Tract: No adrenal nodule or mass. Kidneys unremarkable. No evidence for hydroureter. The urinary bladder appears normal for the degree of distention. Stomach/Bowel: Stomach is unremarkable. No gastric wall thickening. No evidence of outlet obstruction. Duodenum is normally positioned as is the ligament of Treitz. No small bowel wall thickening. No small bowel dilatation. The terminal ileum is normal. The appendix is normal. No gross colonic mass. No colonic wall thickening. Diverticular changes are noted in the left colon without evidence of diverticulitis. Vascular/Lymphatic: There is moderate atherosclerotic calcification of the abdominal aorta without aneurysm. There is no gastrohepatic or hepatoduodenal ligament lymphadenopathy. No retroperitoneal or mesenteric lymphadenopathy. No pelvic sidewall lymphadenopathy. Reproductive: The uterus is unremarkable. Calcified lesion left adnexal space may be a calcified exophytic fibroid or calcified broad ligament fibroid. No right adnexal mass. Other: No intraperitoneal free fluid. Musculoskeletal: Interval progression of the right rectus sheath hematoma. Previous hematoma in the right rectus sheath was mainly above the level of the umbilicus and measured at 8.4 x 8.4 x 4.6 cm. This component is more heterogeneous,  suspicious for interval rebleeding and now measures approximately 10.0 x 4.3 x 7.5 cm . There is a new hematoma low in the right rectus sheath (axial image 64/2) measuring 7.6 x 4.5 x 4.9 cm . Overall evolution of this hematoma is probably best appreciated by comparing sagittal image 68/6 today to sagittal image 77/6 previously. No worrisome lytic or sclerotic osseous abnormality. Diffuse degenerative disc disease noted lumbar spine. IMPRESSION: 1. Interval progression of the right rectus sheath hematoma with new component seen inferiorly in the right rectus sheath today. 2. Left colonic diverticulosis without diverticulitis. 3.  Aortic Atherosclerosis (ICD10-I70.0). Electronically Signed   By: Kennith Center M.D.   On: 04/25/2023 15:47   CT ABDOMEN PELVIS WO CONTRAST  Result Date: 04/21/2023 CLINICAL DATA:  Abdominal pain EXAM: CT ABDOMEN AND PELVIS WITHOUT CONTRAST TECHNIQUE: Multidetector CT imaging of the abdomen and pelvis was performed following the standard protocol without IV contrast. RADIATION DOSE REDUCTION: This exam was performed according to the departmental dose-optimization program which includes automated exposure control, adjustment of the mA and/or kV according to patient size and/or use of iterative reconstruction technique. COMPARISON:  12/16/2021 FINDINGS: Lower chest: No acute abnormality. Coronary artery calcifications. Scarring or of the bilateral lung bases. Hepatobiliary: No focal liver abnormality is seen. Status post cholecystectomy. No biliary dilatation. Pancreas: Unremarkable. No pancreatic ductal dilatation or surrounding inflammatory changes. Spleen: Normal in size without significant abnormality. Adrenals/Urinary Tract: Adrenal glands are unremarkable. Kidneys are normal, without renal calculi, solid lesion, or hydronephrosis. Bladder is unremarkable. Stomach/Bowel: Stomach is within normal limits. Appendix appears normal. No evidence of bowel wall thickening, distention, or  inflammatory changes. Descending and sigmoid diverticulosis. Vascular/Lymphatic: Aortic atherosclerosis. No enlarged abdominal or pelvic lymph nodes. Reproductive: Exophytic calcified fibroid arising from the left aspect of the uterine fundus. Other: No abdominal wall  hernia. Layering rectus sheath hematoma along the inferior aspect of the right rectus musculature measuring 8.4 x 8.4 x 4.6 cm (series 2, image 50, series 6, image 74). No ascites. Musculoskeletal: No acute or significant osseous findings. IMPRESSION: 1. Layering rectus sheath hematoma along the inferior aspect of the right rectus musculature measuring 8.4 x 8.4 x 4.6 cm. 2. Descending and sigmoid diverticulosis without evidence of acute diverticulitis. 3. Coronary artery disease. Aortic Atherosclerosis (ICD10-I70.0). Electronically Signed   By: Jearld Lesch M.D.   On: 04/21/2023 16:41   ECHOCARDIOGRAM COMPLETE  Result Date: 04/19/2023    ECHOCARDIOGRAM REPORT   Patient Name:   JONTAE SCHOENER Date of Exam: 04/19/2023 Medical Rec #:  [id number]      Height:       61.0 in Accession #:    [id number]     Weight:       251.8 lb Date of Birth:  [birth year]       BSA:          2.083 m Patient Age:    76 years       BP:           111/59 mmHg Patient Gender: F              HR:           93 bpm. [facility] Procedure: 2D Echo, Cardiac Doppler and Color Doppler Indications:    Congestive Heart Failure I50.9  History:        Patient has prior history of Echocardiogram examinations, most                 recent 07/20/2021. CHF; Risk Factors:Hypertension and                 Dyslipidemia.  Sonographer:    Celesta Gentile RCS Referring Phys: [phone] Heloise Beecham [facility]  Sonographer Comments: Technically difficult study due to poor echo windows and patient is obese. IMPRESSIONS  1. Left ventricular ejection fraction, by estimation, is 60 to 65%. The left ventricle has normal function. The left ventricle has no regional wall motion abnormalities. There is mild  left ventricular hypertrophy. Left ventricular diastolic parameters are consistent with Grade I diastolic dysfunction (impaired relaxation).  2. Right ventricular systolic function is normal. The right ventricular size is mildly enlarged. Tricuspid regurgitation signal is inadequate for assessing PA pressure.  3. The mitral valve was not well visualized. No evidence of mitral valve regurgitation. No evidence of mitral stenosis.  4. The aortic valve was not well visualized. Aortic valve regurgitation is not visualized. No aortic stenosis is present.  5. The inferior vena cava is dilated in size with >50% respiratory variability, suggesting right atrial pressure of 8 mmHg. FINDINGS  Left Ventricle: Left ventricular ejection fraction, by estimation, is 60 to 65%. The left ventricle has normal function. The left ventricle has no regional wall motion abnormalities. Definity contrast agent was given IV to delineate the left ventricular  endocardial borders. The left ventricular internal cavity size was normal in size. There is mild left ventricular hypertrophy. Left ventricular diastolic parameters are consistent with Grade I diastolic dysfunction (impaired relaxation). Right Ventricle: The right ventricular size is mildly enlarged. No increase in right ventricular wall thickness. Right ventricular systolic function is normal. Tricuspid regurgitation signal is inadequate for assessing PA pressure. Left Atrium: Left atrial size was normal in size. Right Atrium: Right atrial size was normal in size. Pericardium: Trivial pericardial effusion is present. Mitral  Valve: The mitral valve was not well visualized. No evidence of mitral valve regurgitation. No evidence of mitral valve stenosis. Tricuspid Valve: The tricuspid valve is normal in structure. Tricuspid valve regurgitation is not demonstrated. No evidence of tricuspid stenosis. Aortic Valve: The aortic valve was not well visualized. Aortic valve regurgitation is not  visualized. No aortic stenosis is present. Aortic valve mean gradient measures 8.0 mmHg. Aortic valve peak gradient measures 13.2 mmHg. Aortic valve area, by VTI measures 1.32 cm. Pulmonic Valve: The pulmonic valve was not well visualized. Pulmonic valve regurgitation is trivial. No evidence of pulmonic stenosis. Aorta: The aortic root is normal in size and structure. Venous: The inferior vena cava is dilated in size with greater than 50% respiratory variability, suggesting right atrial pressure of 8 mmHg. IAS/Shunts: The interatrial septum was not well visualized.  LEFT VENTRICLE PLAX 2D LVIDd:         3.70 cm   Diastology LVIDs:         1.90 cm   LV e' lateral:   8.05 cm/s LV PW:         1.40 cm   LV E/e' lateral: 9.8 LV IVS:        1.30 cm LVOT diam:     1.70 cm LV SV:         53 LV SV Index:   26 LVOT Area:     2.27 cm  LEFT ATRIUM             Index LA diam:        3.30 cm 1.58 cm/m LA Vol (A2C):   29.8 ml 14.31 ml/m LA Vol (A4C):   29.1 ml 13.97 ml/m LA Biplane Vol: 29.6 ml 14.21 ml/m  AORTIC VALVE AV Area (Vmax):    1.30 cm AV Area (Vmean):   1.37 cm AV Area (VTI):     1.32 cm AV Vmax:           182.00 cm/s AV Vmean:          135.000 cm/s AV VTI:            0.401 m AV Peak Grad:      13.2 mmHg AV Mean Grad:      8.0 mmHg LVOT Vmax:         104.00 cm/s LVOT Vmean:        81.600 cm/s LVOT VTI:          0.234 m LVOT/AV VTI ratio: 0.58  AORTA Ao Root diam: 3.70 cm MITRAL VALVE MV Area (PHT): 2.95 cm     SHUNTS MV Decel Time: 257 msec     Systemic VTI:  0.23 m MV E velocity: 79.00 cm/s   Systemic Diam: 1.70 cm MV A velocity: 136.00 cm/s MV E/A ratio:  0.58 Vishnu Priya Mallipeddi Electronically signed by Winfield Rast Mallipeddi Signature Date/Time: 04/19/2023/12:18:16 PM    Final    NM Pulmonary Perfusion  Result Date: 04/18/2023 CLINICAL DATA:  Pulmonary embolism (PE) suspected, low to intermediate prob, positive D-dimer EXAM: NUCLEAR MEDICINE PERFUSION LUNG SCAN TECHNIQUE: Perfusion images were  obtained in multiple projections after intravenous injection of radiopharmaceutical. Ventilation scans intentionally deferred if perfusion scan and chest x-ray adequate for interpretation during COVID 19 epidemic. RADIOPHARMACEUTICALS:  4.4 mCi Tc-71m MAA IV COMPARISON:  04/18/2023 FINDINGS: Planar images of the lungs are obtained in multiple projections during the perfusion phase of the exam. There is a single wedge-shaped subsegmental perfusion defect within the lateral aspect of the superior  segment right lower lobe. No other perfusion defects. IMPRESSION: 1. Single wedge-shaped perfusion defect within the lateral aspect superior segment right lower lobe, compatible with pulmonary embolus based on PISAPED criteria. Critical Value/emergent results were called by telephone at the time of interpretation on 04/18/2023 at 5:27pm to provider Dr. Charm Barges, who verbally acknowledged these results. Electronically Signed   By: Sharlet Salina M.D.   On: 04/18/2023 17:42   US Venous Img Lower Bilateral (DVT)  Result Date: 04/18/2023 CLINICAL DATA:  Bilateral edema, worsening right.  Pain EXAM: BILATERAL LOWER EXTREMITY VENOUS DOPPLER ULTRASOUND TECHNIQUE: Gray-scale sonography with compression, as well as color and duplex ultrasound, were performed to evaluate the deep venous system(s) from the level of the common femoral vein through the popliteal and proximal calf veins. COMPARISON:  08/29/2007 FINDINGS: VENOUS Normal compressibility of the common femoral, superficial femoral, and popliteal veins, as well as the visualized calf veins. Visualized portions of profunda femoral vein and great saphenous vein unremarkable. No filling defects to suggest DVT on grayscale or color Doppler imaging. Doppler waveforms show normal direction of venous flow, normal respiratory plasticity and response to augmentation. OTHER Marked subcutaneous edema in the distal left calf. Limitations: Technologist describes technically difficult study  secondary to body habitus, pitting edema, and patient pain intolerance limiting calf vein evaluation. IMPRESSION: No femoropopliteal DVT . If clinical symptoms are inconsistent or if there are persistent or worsening symptoms, further imaging (possibly involving the iliac veins) may be warranted. Electronically Signed   By: Corlis Leak M.D.   On: 04/18/2023 15:09   DG Chest 2 View  Result Date: 04/18/2023 CLINICAL DATA:  Chest pain EXAM: CHEST - 2 VIEW COMPARISON:  03/27/2019 FINDINGS: Underinflation. Bronchovascular crowding. Borderline cardiopericardial silhouette with a calcified aorta. Left midlung scar or atelectasis is once again identified. No pneumothorax or effusion. Overlapping cardiac leads. Degenerative changes of the shoulders and spine. Under penetrated radiograph IMPRESSION: Underinflation with bronchovascular crowding. Stable left midlung scar or atelectasis. Electronically Signed   By: Karen Kays M.D.   On: 04/18/2023 13:06    Microbiology: Results for orders placed or performed during the hospital encounter of 04/18/23  MRSA Next Gen by PCR, Nasal     Status: None   Collection Time: 04/21/23  6:29 PM   Specimen: Nasal Mucosa; Nasal Swab  Result Value Ref Range Status   MRSA by PCR Next Gen NOT DETECTED NOT DETECTED Final    Comment: (NOTE) The GeneXpert MRSA Assay (FDA approved for NASAL specimens only), is one component of a comprehensive MRSA colonization surveillance program. It is not intended to diagnose MRSA infection nor to guide or monitor treatment for MRSA infections. Test performance is not FDA approved in patients less than 60 years old. Performed at [facility], [address], [place], [place] [address]     Labs: CBC: Recent Labs  Lab 04/25/23 0502 04/26/23 0357 04/27/23 0523 04/28/23 0356 04/29/23 0435 05/01/23 0424  WBC 10.6* 10.6*  --  11.8* 10.3 11.6*  NEUTROABS  --  6.8  --   --   --   --   HGB 8.4* 8.4* 8.6* 8.5* 8.6* 9.0*  HCT 27.0* 26.8*  26.9* 27.0* 27.4* 29.1*  MCV 95.4 94.0  --  93.4 93.8 95.1  PLT 265 280  --  347 340 377   Basic Metabolic Panel: Recent Labs  Lab 04/26/23 0357 04/27/23 0811 04/28/23 0356 04/29/23 0435  NA 134* 134* 134* 135  K 4.3 4.3 4.3 4.5  CL 98 98 99  103  CO2 29 26 25 25   GLUCOSE 88 88 79 82  BUN 35* 33* 37* 34*  CREATININE 1.43* 1.39* 1.37* 1.37*  CALCIUM 8.8* 8.9 8.7* 8.5*   Discharge time spent: greater than 30 minutes.  Signed: Vassie Loll, MD [facility] 05/01/2023

## 2023-05-02 DIAGNOSIS — I10 Essential (primary) hypertension: Secondary | ICD-10-CM | POA: Diagnosis not present

## 2023-05-02 DIAGNOSIS — K219 Gastro-esophageal reflux disease without esophagitis: Secondary | ICD-10-CM | POA: Diagnosis not present

## 2023-05-02 DIAGNOSIS — S301XXD Contusion of abdominal wall, subsequent encounter: Secondary | ICD-10-CM | POA: Diagnosis not present

## 2023-05-02 DIAGNOSIS — M069 Rheumatoid arthritis, unspecified: Secondary | ICD-10-CM | POA: Diagnosis not present

## 2023-05-02 DIAGNOSIS — D649 Anemia, unspecified: Secondary | ICD-10-CM | POA: Diagnosis not present

## 2023-05-02 DIAGNOSIS — G894 Chronic pain syndrome: Secondary | ICD-10-CM | POA: Diagnosis not present

## 2023-05-02 DIAGNOSIS — N184 Chronic kidney disease, stage 4 (severe): Secondary | ICD-10-CM | POA: Diagnosis not present

## 2023-05-02 DIAGNOSIS — I2699 Other pulmonary embolism without acute cor pulmonale: Secondary | ICD-10-CM | POA: Diagnosis not present

## 2023-05-04 ENCOUNTER — Ambulatory Visit: Payer: Medicare Other | Admitting: Nurse Practitioner

## 2023-05-07 DIAGNOSIS — I2699 Other pulmonary embolism without acute cor pulmonale: Secondary | ICD-10-CM | POA: Diagnosis not present

## 2023-05-07 DIAGNOSIS — R5381 Other malaise: Secondary | ICD-10-CM | POA: Diagnosis not present

## 2023-05-08 DIAGNOSIS — K219 Gastro-esophageal reflux disease without esophagitis: Secondary | ICD-10-CM | POA: Diagnosis not present

## 2023-05-08 DIAGNOSIS — S301XXD Contusion of abdominal wall, subsequent encounter: Secondary | ICD-10-CM | POA: Diagnosis not present

## 2023-05-08 DIAGNOSIS — M353 Polymyalgia rheumatica: Secondary | ICD-10-CM | POA: Diagnosis not present

## 2023-05-08 DIAGNOSIS — R5381 Other malaise: Secondary | ICD-10-CM | POA: Diagnosis not present

## 2023-05-08 DIAGNOSIS — I10 Essential (primary) hypertension: Secondary | ICD-10-CM | POA: Diagnosis not present

## 2023-05-09 ENCOUNTER — Other Ambulatory Visit: Payer: Self-pay | Admitting: *Deleted

## 2023-05-09 NOTE — Patient Outreach (Signed)
Per [facility] Mrs. Buesing resides in [facility] skilled nursing facility. Screening for potential care coordination services as benefit of health plan and Primary Care Provider.   Secure message sent to [facility] social worker to inquire about transition plans/needs.   Will continue to follow.  Raiford Noble, MSN, RN,BSN [facility] Post Acute Care Coordinator [phone] (Direct dial)

## 2023-05-14 DIAGNOSIS — D649 Anemia, unspecified: Secondary | ICD-10-CM | POA: Diagnosis not present

## 2023-05-14 DIAGNOSIS — G4700 Insomnia, unspecified: Secondary | ICD-10-CM | POA: Diagnosis not present

## 2023-05-22 DIAGNOSIS — R52 Pain, unspecified: Secondary | ICD-10-CM | POA: Diagnosis not present

## 2023-05-22 DIAGNOSIS — S81802A Unspecified open wound, left lower leg, initial encounter: Secondary | ICD-10-CM | POA: Diagnosis not present

## 2023-05-22 DIAGNOSIS — G9341 Metabolic encephalopathy: Secondary | ICD-10-CM | POA: Diagnosis not present

## 2023-05-22 DIAGNOSIS — A419 Sepsis, unspecified organism: Secondary | ICD-10-CM | POA: Diagnosis not present

## 2023-05-22 DIAGNOSIS — M353 Polymyalgia rheumatica: Secondary | ICD-10-CM | POA: Diagnosis not present

## 2023-05-22 DIAGNOSIS — R5381 Other malaise: Secondary | ICD-10-CM | POA: Diagnosis not present

## 2023-05-22 DIAGNOSIS — G4700 Insomnia, unspecified: Secondary | ICD-10-CM | POA: Diagnosis not present

## 2023-05-22 DIAGNOSIS — D649 Anemia, unspecified: Secondary | ICD-10-CM | POA: Diagnosis not present

## 2023-05-22 DIAGNOSIS — M79642 Pain in left hand: Secondary | ICD-10-CM | POA: Diagnosis not present

## 2023-05-22 DIAGNOSIS — R531 Weakness: Secondary | ICD-10-CM | POA: Diagnosis not present

## 2023-05-22 DIAGNOSIS — Z760 Encounter for issue of repeat prescription: Secondary | ICD-10-CM | POA: Diagnosis not present

## 2023-05-22 DIAGNOSIS — I5032 Chronic diastolic (congestive) heart failure: Secondary | ICD-10-CM | POA: Diagnosis not present

## 2023-05-25 DIAGNOSIS — I5033 Acute on chronic diastolic (congestive) heart failure: Secondary | ICD-10-CM | POA: Diagnosis not present

## 2023-05-25 DIAGNOSIS — D649 Anemia, unspecified: Secondary | ICD-10-CM | POA: Diagnosis not present

## 2023-06-12 DIAGNOSIS — D62 Acute posthemorrhagic anemia: Secondary | ICD-10-CM | POA: Diagnosis not present

## 2023-06-12 DIAGNOSIS — N2581 Secondary hyperparathyroidism of renal origin: Secondary | ICD-10-CM | POA: Diagnosis not present

## 2023-06-12 DIAGNOSIS — R809 Proteinuria, unspecified: Secondary | ICD-10-CM | POA: Diagnosis not present

## 2023-06-12 DIAGNOSIS — E875 Hyperkalemia: Secondary | ICD-10-CM | POA: Diagnosis not present

## 2023-06-27 DIAGNOSIS — I130 Hypertensive heart and chronic kidney disease with heart failure and stage 1 through stage 4 chronic kidney disease, or unspecified chronic kidney disease: Secondary | ICD-10-CM | POA: Diagnosis not present

## 2023-06-27 DIAGNOSIS — I5033 Acute on chronic diastolic (congestive) heart failure: Secondary | ICD-10-CM | POA: Diagnosis not present

## 2023-06-27 DIAGNOSIS — I2699 Other pulmonary embolism without acute cor pulmonale: Secondary | ICD-10-CM | POA: Diagnosis not present

## 2023-06-27 DIAGNOSIS — N184 Chronic kidney disease, stage 4 (severe): Secondary | ICD-10-CM | POA: Diagnosis not present

## 2023-08-17 DIAGNOSIS — N1832 Chronic kidney disease, stage 3b: Secondary | ICD-10-CM | POA: Diagnosis not present

## 2023-08-17 DIAGNOSIS — I5033 Acute on chronic diastolic (congestive) heart failure: Secondary | ICD-10-CM | POA: Diagnosis not present

## 2023-08-17 DIAGNOSIS — N189 Chronic kidney disease, unspecified: Secondary | ICD-10-CM | POA: Diagnosis not present

## 2023-08-17 DIAGNOSIS — R809 Proteinuria, unspecified: Secondary | ICD-10-CM | POA: Diagnosis not present

## 2023-08-17 DIAGNOSIS — D62 Acute posthemorrhagic anemia: Secondary | ICD-10-CM | POA: Diagnosis not present

## 2023-08-22 DIAGNOSIS — I5033 Acute on chronic diastolic (congestive) heart failure: Secondary | ICD-10-CM | POA: Diagnosis not present

## 2023-08-22 DIAGNOSIS — N184 Chronic kidney disease, stage 4 (severe): Secondary | ICD-10-CM | POA: Diagnosis not present

## 2023-08-22 DIAGNOSIS — N2581 Secondary hyperparathyroidism of renal origin: Secondary | ICD-10-CM | POA: Diagnosis not present

## 2023-08-22 DIAGNOSIS — R809 Proteinuria, unspecified: Secondary | ICD-10-CM | POA: Diagnosis not present

## 2023-10-02 DIAGNOSIS — I130 Hypertensive heart and chronic kidney disease with heart failure and stage 1 through stage 4 chronic kidney disease, or unspecified chronic kidney disease: Secondary | ICD-10-CM | POA: Diagnosis not present

## 2023-10-02 DIAGNOSIS — I5032 Chronic diastolic (congestive) heart failure: Secondary | ICD-10-CM | POA: Diagnosis not present

## 2023-10-02 DIAGNOSIS — I2699 Other pulmonary embolism without acute cor pulmonale: Secondary | ICD-10-CM | POA: Diagnosis not present

## 2023-10-02 DIAGNOSIS — N184 Chronic kidney disease, stage 4 (severe): Secondary | ICD-10-CM | POA: Diagnosis not present

## 2023-10-12 DIAGNOSIS — D631 Anemia in chronic kidney disease: Secondary | ICD-10-CM | POA: Diagnosis not present

## 2023-10-12 DIAGNOSIS — E211 Secondary hyperparathyroidism, not elsewhere classified: Secondary | ICD-10-CM | POA: Diagnosis not present

## 2023-10-12 DIAGNOSIS — R809 Proteinuria, unspecified: Secondary | ICD-10-CM | POA: Diagnosis not present

## 2023-10-12 DIAGNOSIS — N189 Chronic kidney disease, unspecified: Secondary | ICD-10-CM | POA: Diagnosis not present

## 2023-10-18 DIAGNOSIS — I129 Hypertensive chronic kidney disease with stage 1 through stage 4 chronic kidney disease, or unspecified chronic kidney disease: Secondary | ICD-10-CM | POA: Diagnosis not present

## 2023-10-18 DIAGNOSIS — N2581 Secondary hyperparathyroidism of renal origin: Secondary | ICD-10-CM | POA: Diagnosis not present

## 2023-10-18 DIAGNOSIS — R809 Proteinuria, unspecified: Secondary | ICD-10-CM | POA: Diagnosis not present

## 2023-10-18 DIAGNOSIS — E875 Hyperkalemia: Secondary | ICD-10-CM | POA: Diagnosis not present

## 2023-10-31 DIAGNOSIS — M069 Rheumatoid arthritis, unspecified: Secondary | ICD-10-CM | POA: Diagnosis not present

## 2023-10-31 DIAGNOSIS — E559 Vitamin D deficiency, unspecified: Secondary | ICD-10-CM | POA: Diagnosis not present

## 2023-10-31 DIAGNOSIS — E782 Mixed hyperlipidemia: Secondary | ICD-10-CM | POA: Diagnosis not present

## 2023-12-03 DIAGNOSIS — I5032 Chronic diastolic (congestive) heart failure: Secondary | ICD-10-CM | POA: Diagnosis not present

## 2023-12-03 DIAGNOSIS — I130 Hypertensive heart and chronic kidney disease with heart failure and stage 1 through stage 4 chronic kidney disease, or unspecified chronic kidney disease: Secondary | ICD-10-CM | POA: Diagnosis not present

## 2023-12-03 DIAGNOSIS — N184 Chronic kidney disease, stage 4 (severe): Secondary | ICD-10-CM | POA: Diagnosis not present

## 2023-12-03 DIAGNOSIS — I2699 Other pulmonary embolism without acute cor pulmonale: Secondary | ICD-10-CM | POA: Diagnosis not present

## 2024-02-18 DIAGNOSIS — I130 Hypertensive heart and chronic kidney disease with heart failure and stage 1 through stage 4 chronic kidney disease, or unspecified chronic kidney disease: Secondary | ICD-10-CM | POA: Diagnosis not present

## 2024-02-18 DIAGNOSIS — N184 Chronic kidney disease, stage 4 (severe): Secondary | ICD-10-CM | POA: Diagnosis not present

## 2024-02-18 DIAGNOSIS — I5032 Chronic diastolic (congestive) heart failure: Secondary | ICD-10-CM | POA: Diagnosis not present

## 2024-02-18 DIAGNOSIS — M069 Rheumatoid arthritis, unspecified: Secondary | ICD-10-CM | POA: Diagnosis not present

## 2024-03-11 DIAGNOSIS — R809 Proteinuria, unspecified: Secondary | ICD-10-CM | POA: Diagnosis not present

## 2024-03-11 DIAGNOSIS — D631 Anemia in chronic kidney disease: Secondary | ICD-10-CM | POA: Diagnosis not present

## 2024-03-11 DIAGNOSIS — N189 Chronic kidney disease, unspecified: Secondary | ICD-10-CM | POA: Diagnosis not present

## 2024-03-18 DIAGNOSIS — R809 Proteinuria, unspecified: Secondary | ICD-10-CM | POA: Diagnosis not present

## 2024-03-18 DIAGNOSIS — D631 Anemia in chronic kidney disease: Secondary | ICD-10-CM | POA: Diagnosis not present

## 2024-03-18 DIAGNOSIS — N189 Chronic kidney disease, unspecified: Secondary | ICD-10-CM | POA: Diagnosis not present

## 2024-03-18 DIAGNOSIS — E211 Secondary hyperparathyroidism, not elsewhere classified: Secondary | ICD-10-CM | POA: Diagnosis not present

## 2024-03-19 DIAGNOSIS — N1832 Chronic kidney disease, stage 3b: Secondary | ICD-10-CM | POA: Diagnosis not present

## 2024-03-19 DIAGNOSIS — I5032 Chronic diastolic (congestive) heart failure: Secondary | ICD-10-CM | POA: Diagnosis not present

## 2024-03-19 DIAGNOSIS — I129 Hypertensive chronic kidney disease with stage 1 through stage 4 chronic kidney disease, or unspecified chronic kidney disease: Secondary | ICD-10-CM | POA: Diagnosis not present

## 2024-03-19 DIAGNOSIS — N2581 Secondary hyperparathyroidism of renal origin: Secondary | ICD-10-CM | POA: Diagnosis not present

## 2024-03-31 DIAGNOSIS — I129 Hypertensive chronic kidney disease with stage 1 through stage 4 chronic kidney disease, or unspecified chronic kidney disease: Secondary | ICD-10-CM | POA: Diagnosis not present

## 2024-03-31 DIAGNOSIS — N184 Chronic kidney disease, stage 4 (severe): Secondary | ICD-10-CM | POA: Diagnosis not present

## 2024-03-31 DIAGNOSIS — I5033 Acute on chronic diastolic (congestive) heart failure: Secondary | ICD-10-CM | POA: Diagnosis not present

## 2024-03-31 DIAGNOSIS — N2581 Secondary hyperparathyroidism of renal origin: Secondary | ICD-10-CM | POA: Diagnosis not present

## 2024-04-01 DIAGNOSIS — I129 Hypertensive chronic kidney disease with stage 1 through stage 4 chronic kidney disease, or unspecified chronic kidney disease: Secondary | ICD-10-CM | POA: Diagnosis not present

## 2024-04-01 DIAGNOSIS — I5033 Acute on chronic diastolic (congestive) heart failure: Secondary | ICD-10-CM | POA: Diagnosis not present

## 2024-04-01 DIAGNOSIS — R809 Proteinuria, unspecified: Secondary | ICD-10-CM | POA: Diagnosis not present

## 2024-04-01 DIAGNOSIS — N1832 Chronic kidney disease, stage 3b: Secondary | ICD-10-CM | POA: Diagnosis not present

## 2024-04-10 DIAGNOSIS — Z6841 Body Mass Index (BMI) 40.0 and over, adult: Secondary | ICD-10-CM | POA: Diagnosis not present

## 2024-04-10 DIAGNOSIS — N189 Chronic kidney disease, unspecified: Secondary | ICD-10-CM | POA: Diagnosis not present

## 2024-04-10 DIAGNOSIS — Z993 Dependence on wheelchair: Secondary | ICD-10-CM | POA: Diagnosis not present

## 2024-04-10 DIAGNOSIS — D631 Anemia in chronic kidney disease: Secondary | ICD-10-CM | POA: Diagnosis not present

## 2024-04-10 DIAGNOSIS — M353 Polymyalgia rheumatica: Secondary | ICD-10-CM | POA: Diagnosis not present

## 2024-04-10 DIAGNOSIS — M069 Rheumatoid arthritis, unspecified: Secondary | ICD-10-CM | POA: Diagnosis not present

## 2024-04-10 DIAGNOSIS — M48061 Spinal stenosis, lumbar region without neurogenic claudication: Secondary | ICD-10-CM | POA: Diagnosis not present

## 2024-04-10 DIAGNOSIS — R809 Proteinuria, unspecified: Secondary | ICD-10-CM | POA: Diagnosis not present

## 2024-04-17 DIAGNOSIS — N184 Chronic kidney disease, stage 4 (severe): Secondary | ICD-10-CM | POA: Diagnosis not present

## 2024-04-17 DIAGNOSIS — R809 Proteinuria, unspecified: Secondary | ICD-10-CM | POA: Diagnosis not present

## 2024-04-17 DIAGNOSIS — N2581 Secondary hyperparathyroidism of renal origin: Secondary | ICD-10-CM | POA: Diagnosis not present

## 2024-04-17 DIAGNOSIS — I129 Hypertensive chronic kidney disease with stage 1 through stage 4 chronic kidney disease, or unspecified chronic kidney disease: Secondary | ICD-10-CM | POA: Diagnosis not present

## 2024-05-07 DIAGNOSIS — N390 Urinary tract infection, site not specified: Secondary | ICD-10-CM | POA: Diagnosis not present

## 2024-05-07 DIAGNOSIS — R799 Abnormal finding of blood chemistry, unspecified: Secondary | ICD-10-CM | POA: Diagnosis not present

## 2024-05-09 ENCOUNTER — Encounter: Payer: Self-pay | Admitting: Advanced Practice Midwife

## 2024-06-05 DIAGNOSIS — E211 Secondary hyperparathyroidism, not elsewhere classified: Secondary | ICD-10-CM | POA: Diagnosis not present

## 2024-06-05 DIAGNOSIS — R809 Proteinuria, unspecified: Secondary | ICD-10-CM | POA: Diagnosis not present

## 2024-06-05 DIAGNOSIS — N189 Chronic kidney disease, unspecified: Secondary | ICD-10-CM | POA: Diagnosis not present

## 2024-06-05 DIAGNOSIS — D631 Anemia in chronic kidney disease: Secondary | ICD-10-CM | POA: Diagnosis not present

## 2024-06-09 DIAGNOSIS — N184 Chronic kidney disease, stage 4 (severe): Secondary | ICD-10-CM | POA: Diagnosis not present

## 2024-06-09 DIAGNOSIS — I130 Hypertensive heart and chronic kidney disease with heart failure and stage 1 through stage 4 chronic kidney disease, or unspecified chronic kidney disease: Secondary | ICD-10-CM | POA: Diagnosis not present

## 2024-06-09 DIAGNOSIS — E875 Hyperkalemia: Secondary | ICD-10-CM | POA: Diagnosis not present

## 2024-06-09 DIAGNOSIS — I129 Hypertensive chronic kidney disease with stage 1 through stage 4 chronic kidney disease, or unspecified chronic kidney disease: Secondary | ICD-10-CM | POA: Diagnosis not present

## 2024-06-09 DIAGNOSIS — I5032 Chronic diastolic (congestive) heart failure: Secondary | ICD-10-CM | POA: Diagnosis not present

## 2024-06-09 DIAGNOSIS — M069 Rheumatoid arthritis, unspecified: Secondary | ICD-10-CM | POA: Diagnosis not present

## 2024-06-25 DIAGNOSIS — M5090 Cervical disc disorder, unspecified, unspecified cervical region: Secondary | ICD-10-CM | POA: Diagnosis not present

## 2024-06-25 DIAGNOSIS — Z6841 Body Mass Index (BMI) 40.0 and over, adult: Secondary | ICD-10-CM | POA: Diagnosis not present

## 2024-08-06 ENCOUNTER — Encounter (INDEPENDENT_AMBULATORY_CARE_PROVIDER_SITE_OTHER): Payer: Self-pay | Admitting: Gastroenterology

## 2024-09-17 DIAGNOSIS — I5032 Chronic diastolic (congestive) heart failure: Secondary | ICD-10-CM | POA: Diagnosis not present

## 2024-09-17 DIAGNOSIS — N2581 Secondary hyperparathyroidism of renal origin: Secondary | ICD-10-CM | POA: Diagnosis not present

## 2024-09-17 DIAGNOSIS — N184 Chronic kidney disease, stage 4 (severe): Secondary | ICD-10-CM | POA: Diagnosis not present

## 2024-09-17 DIAGNOSIS — I129 Hypertensive chronic kidney disease with stage 1 through stage 4 chronic kidney disease, or unspecified chronic kidney disease: Secondary | ICD-10-CM | POA: Diagnosis not present
